# Patient Record
Sex: Female | Born: 1975 | Race: White | Hispanic: No | Smoking: Current some day smoker
Health system: Southern US, Community
[De-identification: ages and names within clinical notes are randomized; demographics above are authoritative.]

## PROBLEM LIST (undated history)

## (undated) ENCOUNTER — Emergency Department (HOSPITAL_COMMUNITY): Payer: MEDICAID

## (undated) DIAGNOSIS — F1010 Alcohol abuse, uncomplicated: Secondary | ICD-10-CM

## (undated) DIAGNOSIS — R011 Cardiac murmur, unspecified: Secondary | ICD-10-CM

## (undated) DIAGNOSIS — H409 Unspecified glaucoma: Secondary | ICD-10-CM

## (undated) DIAGNOSIS — F32A Depression, unspecified: Secondary | ICD-10-CM

## (undated) DIAGNOSIS — T7840XA Allergy, unspecified, initial encounter: Secondary | ICD-10-CM

## (undated) DIAGNOSIS — R569 Unspecified convulsions: Secondary | ICD-10-CM

## (undated) DIAGNOSIS — D649 Anemia, unspecified: Secondary | ICD-10-CM

## (undated) DIAGNOSIS — F419 Anxiety disorder, unspecified: Secondary | ICD-10-CM

## (undated) DIAGNOSIS — M1990 Unspecified osteoarthritis, unspecified site: Secondary | ICD-10-CM

## (undated) DIAGNOSIS — F319 Bipolar disorder, unspecified: Secondary | ICD-10-CM

## (undated) HISTORY — DX: Anxiety disorder, unspecified: F41.9

## (undated) HISTORY — DX: Anemia, unspecified: D64.9

## (undated) HISTORY — PX: BREAST BIOPSY: SHX20

## (undated) HISTORY — DX: Depression, unspecified: F32.A

## (undated) HISTORY — DX: Cardiac murmur, unspecified: R01.1

## (undated) HISTORY — DX: Unspecified osteoarthritis, unspecified site: M19.90

## (undated) HISTORY — DX: Unspecified glaucoma: H40.9

## (undated) HISTORY — PX: BREAST SURGERY: SHX581

## (undated) HISTORY — DX: Unspecified convulsions: R56.9

## (undated) HISTORY — PX: BREAST EXCISIONAL BIOPSY: SUR124

## (undated) HISTORY — DX: Allergy, unspecified, initial encounter: T78.40XA

---

## 1998-11-08 HISTORY — PX: LEEP: SHX91

## 2003-11-09 DIAGNOSIS — D649 Anemia, unspecified: Secondary | ICD-10-CM

## 2003-11-09 HISTORY — DX: Anemia, unspecified: D64.9

## 2004-08-10 ENCOUNTER — Emergency Department: Payer: Self-pay | Admitting: Emergency Medicine

## 2004-10-27 ENCOUNTER — Emergency Department: Payer: Self-pay | Admitting: Emergency Medicine

## 2004-11-27 ENCOUNTER — Emergency Department: Payer: Self-pay | Admitting: Unknown Physician Specialty

## 2005-03-28 ENCOUNTER — Observation Stay: Payer: Self-pay | Admitting: Obstetrics and Gynecology

## 2005-05-04 ENCOUNTER — Observation Stay: Payer: Self-pay | Admitting: Obstetrics and Gynecology

## 2005-08-29 ENCOUNTER — Emergency Department: Payer: Self-pay | Admitting: Emergency Medicine

## 2005-09-01 ENCOUNTER — Emergency Department: Payer: Self-pay | Admitting: Unknown Physician Specialty

## 2005-09-20 ENCOUNTER — Emergency Department: Payer: Self-pay | Admitting: Emergency Medicine

## 2005-09-21 ENCOUNTER — Ambulatory Visit: Payer: Self-pay | Admitting: Emergency Medicine

## 2006-02-07 ENCOUNTER — Emergency Department: Payer: Self-pay | Admitting: General Practice

## 2006-04-06 ENCOUNTER — Ambulatory Visit: Payer: Self-pay | Admitting: Unknown Physician Specialty

## 2006-04-10 ENCOUNTER — Emergency Department: Payer: Self-pay | Admitting: Emergency Medicine

## 2006-04-17 IMAGING — US ABDOMEN ULTRASOUND
1 series · 17 of 25 positions shown · non-contrast
Comparison: none

REASON FOR EXAM: RIGHT upper quadrant pain
COMMENTS:

[Series 1: abdomen ultrasound · 17 of 48 slices shown]
[im 1/48]
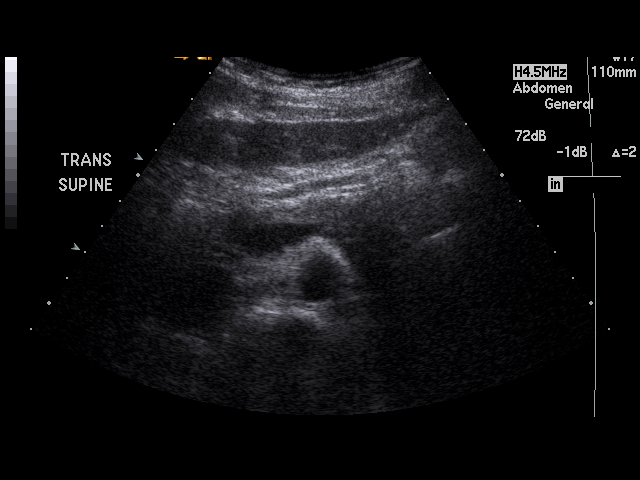
[im 4/48]
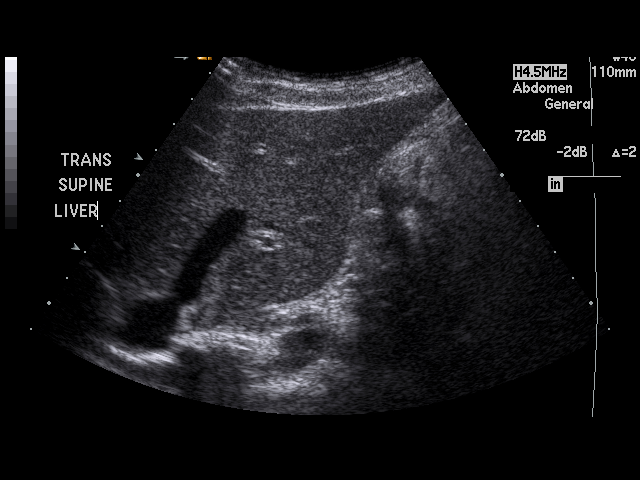
[im 6/48]
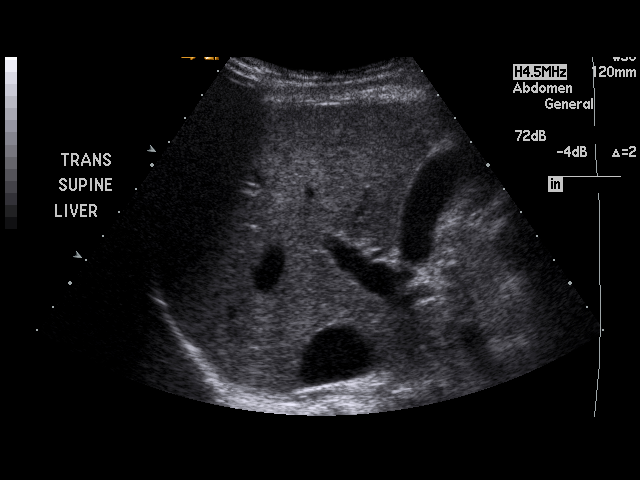
[im 10/48]
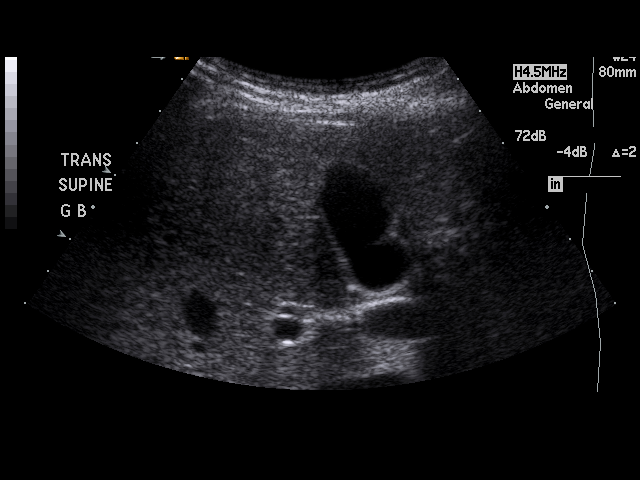
[im 12/48]
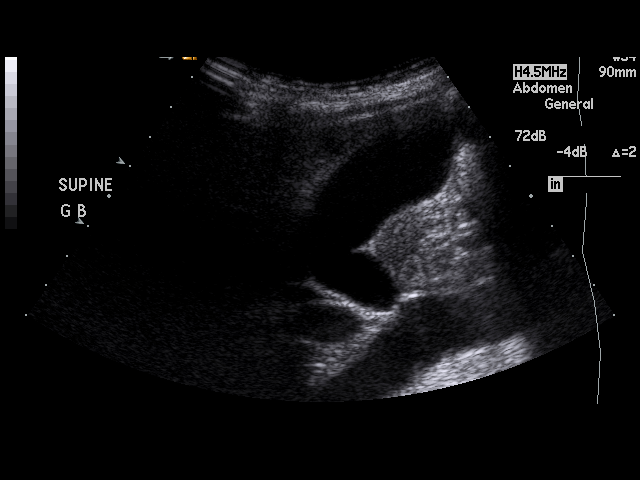
[im 16/48]
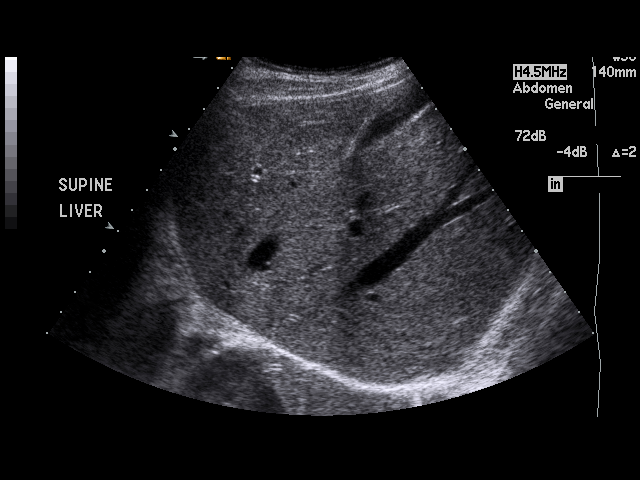
[im 18/48]
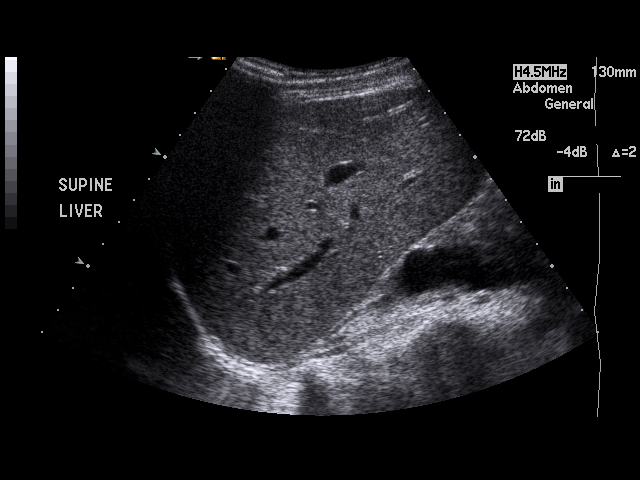
[im 22/48]
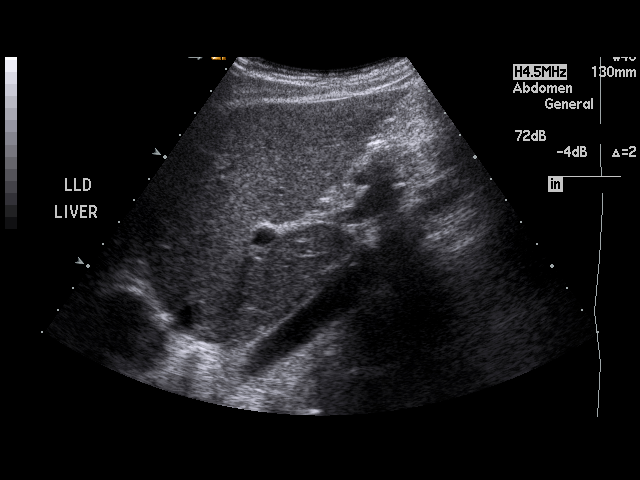
[im 24/48]
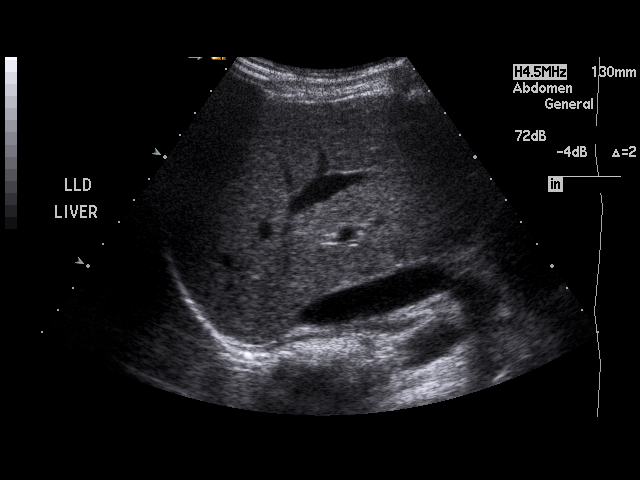
[im 26/48]
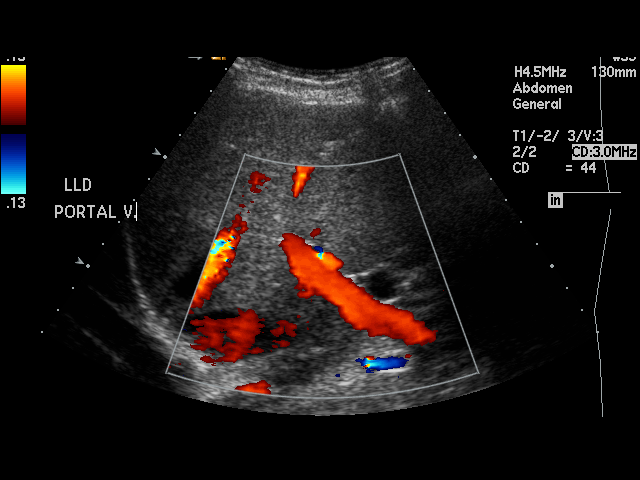
[im 30/48]
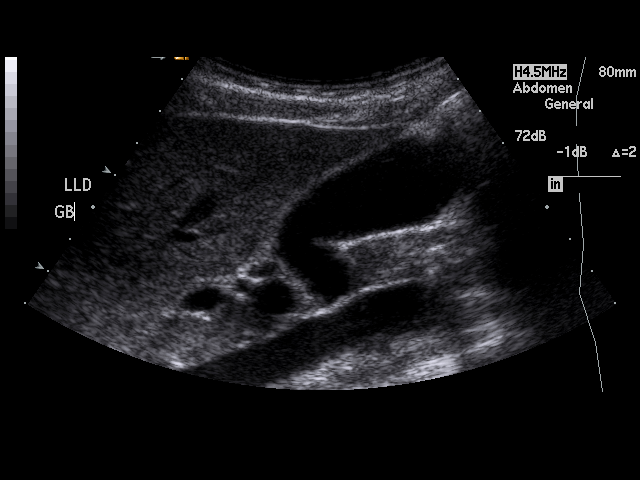
[im 32/48]
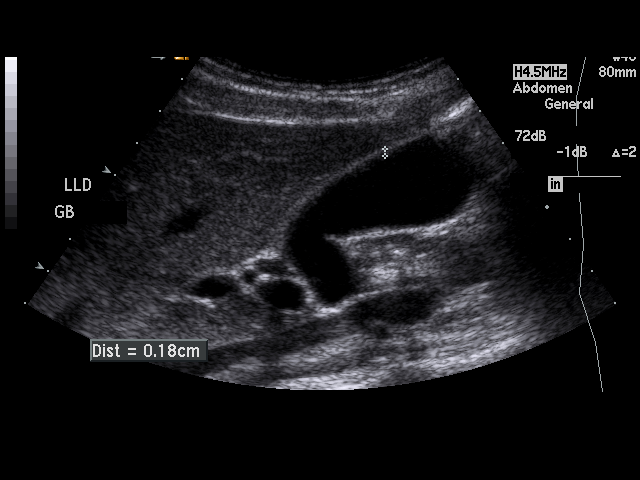
[im 36/48]
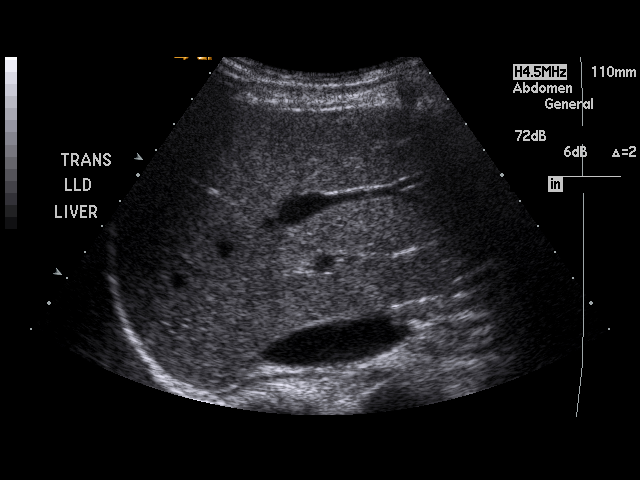
[im 38/48]
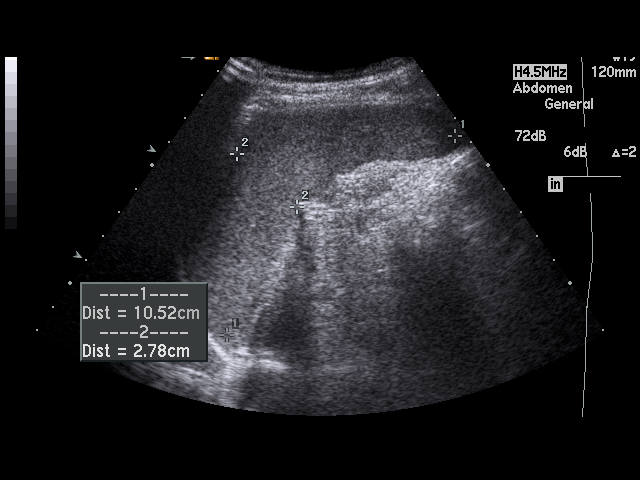
[im 42/48]
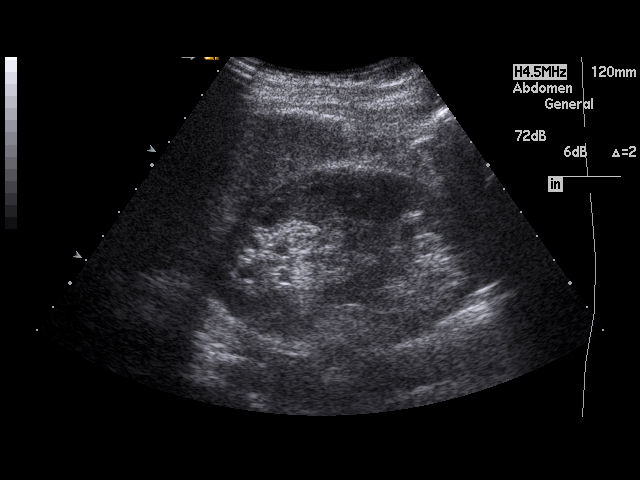
[im 44/48]
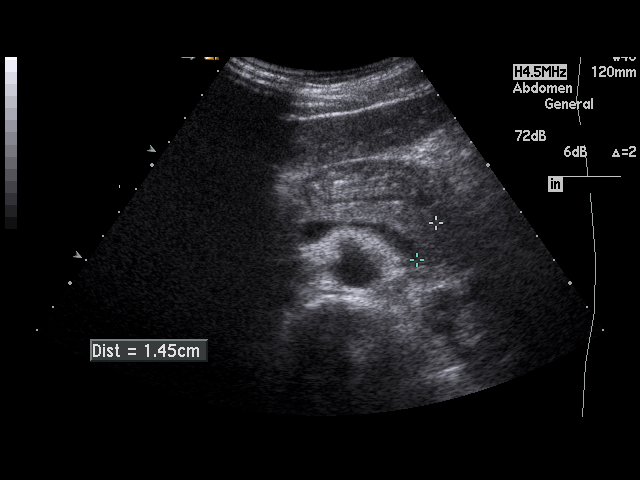
[im 48/48]
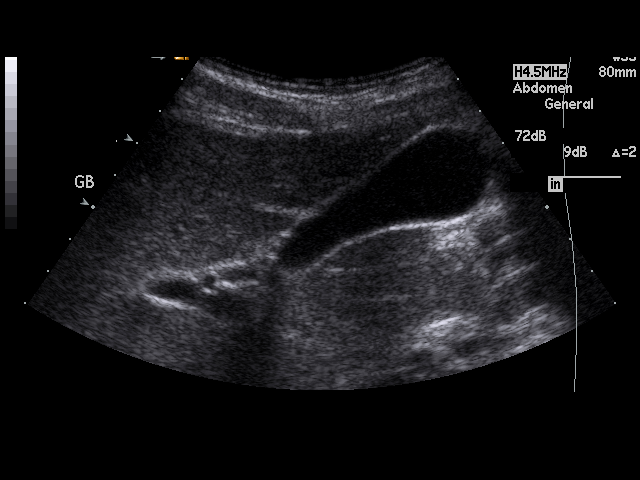

[17 of 25 positions shown; findings below may reference images not displayed]

PROCEDURE:     US  - US ABDOMEN GENERAL SURVEY  - September 21, 2005 [DATE]

RESULT:          The liver, spleen, and pancreas are normal in appearance.
The abdominal aorta is visualized and is normal in appearance.  No
gallstones are seen.  There is no thickening of the gallbladder wall.  The
common bile duct measures 3.3 mm in diameter, which is within normal limits.
 The kidneys show no hydronephrosis.  There is no ascites.
IMPRESSION: No significant abnormalities are noted.

## 2006-05-03 ENCOUNTER — Encounter: Payer: Self-pay | Admitting: Unknown Physician Specialty

## 2006-08-02 ENCOUNTER — Emergency Department: Payer: Self-pay

## 2006-08-02 ENCOUNTER — Other Ambulatory Visit: Payer: Self-pay

## 2006-11-08 DIAGNOSIS — R569 Unspecified convulsions: Secondary | ICD-10-CM

## 2006-11-08 HISTORY — DX: Unspecified convulsions: R56.9

## 2007-03-25 ENCOUNTER — Emergency Department: Payer: Self-pay | Admitting: Emergency Medicine

## 2007-04-12 ENCOUNTER — Inpatient Hospital Stay: Payer: Self-pay | Admitting: Internal Medicine

## 2007-04-16 ENCOUNTER — Emergency Department: Payer: Self-pay | Admitting: Emergency Medicine

## 2007-06-24 ENCOUNTER — Emergency Department: Payer: Self-pay | Admitting: Emergency Medicine

## 2008-02-14 ENCOUNTER — Emergency Department: Payer: Self-pay | Admitting: Emergency Medicine

## 2008-03-05 ENCOUNTER — Emergency Department: Payer: Self-pay | Admitting: Emergency Medicine

## 2008-03-11 ENCOUNTER — Emergency Department: Payer: Self-pay | Admitting: Internal Medicine

## 2008-03-16 ENCOUNTER — Emergency Department: Payer: Self-pay | Admitting: Emergency Medicine

## 2008-03-16 ENCOUNTER — Other Ambulatory Visit: Payer: Self-pay

## 2008-03-21 ENCOUNTER — Other Ambulatory Visit: Payer: Self-pay

## 2008-03-21 ENCOUNTER — Emergency Department: Payer: Self-pay | Admitting: Emergency Medicine

## 2009-02-09 ENCOUNTER — Emergency Department: Payer: Self-pay | Admitting: Emergency Medicine

## 2009-02-26 ENCOUNTER — Ambulatory Visit: Payer: Self-pay

## 2009-02-28 ENCOUNTER — Ambulatory Visit: Payer: Self-pay

## 2009-03-02 ENCOUNTER — Emergency Department: Payer: Self-pay | Admitting: Emergency Medicine

## 2009-03-14 ENCOUNTER — Ambulatory Visit: Payer: Self-pay | Admitting: Surgery

## 2009-03-20 ENCOUNTER — Ambulatory Visit: Payer: Self-pay | Admitting: Surgery

## 2009-03-27 ENCOUNTER — Emergency Department: Payer: Self-pay | Admitting: Internal Medicine

## 2009-04-01 ENCOUNTER — Emergency Department: Payer: Self-pay | Admitting: Emergency Medicine

## 2009-04-17 ENCOUNTER — Emergency Department: Payer: Self-pay | Admitting: Emergency Medicine

## 2009-04-27 ENCOUNTER — Emergency Department: Payer: Self-pay | Admitting: Emergency Medicine

## 2009-04-29 ENCOUNTER — Emergency Department: Payer: Self-pay | Admitting: Emergency Medicine

## 2009-05-02 ENCOUNTER — Emergency Department: Payer: Self-pay | Admitting: Emergency Medicine

## 2009-05-06 ENCOUNTER — Emergency Department: Payer: Self-pay | Admitting: Emergency Medicine

## 2009-05-07 ENCOUNTER — Emergency Department: Payer: Self-pay | Admitting: Emergency Medicine

## 2009-08-01 ENCOUNTER — Inpatient Hospital Stay: Payer: Self-pay | Admitting: Psychiatry

## 2009-09-07 ENCOUNTER — Emergency Department: Payer: Self-pay | Admitting: Unknown Physician Specialty

## 2009-11-22 IMAGING — CR DG ABDOMEN 1V
1 series · 1 of 1 positions shown · non-contrast
Comparison: none

REASON FOR EXAM: abd pain
COMMENTS:

[view not recorded]
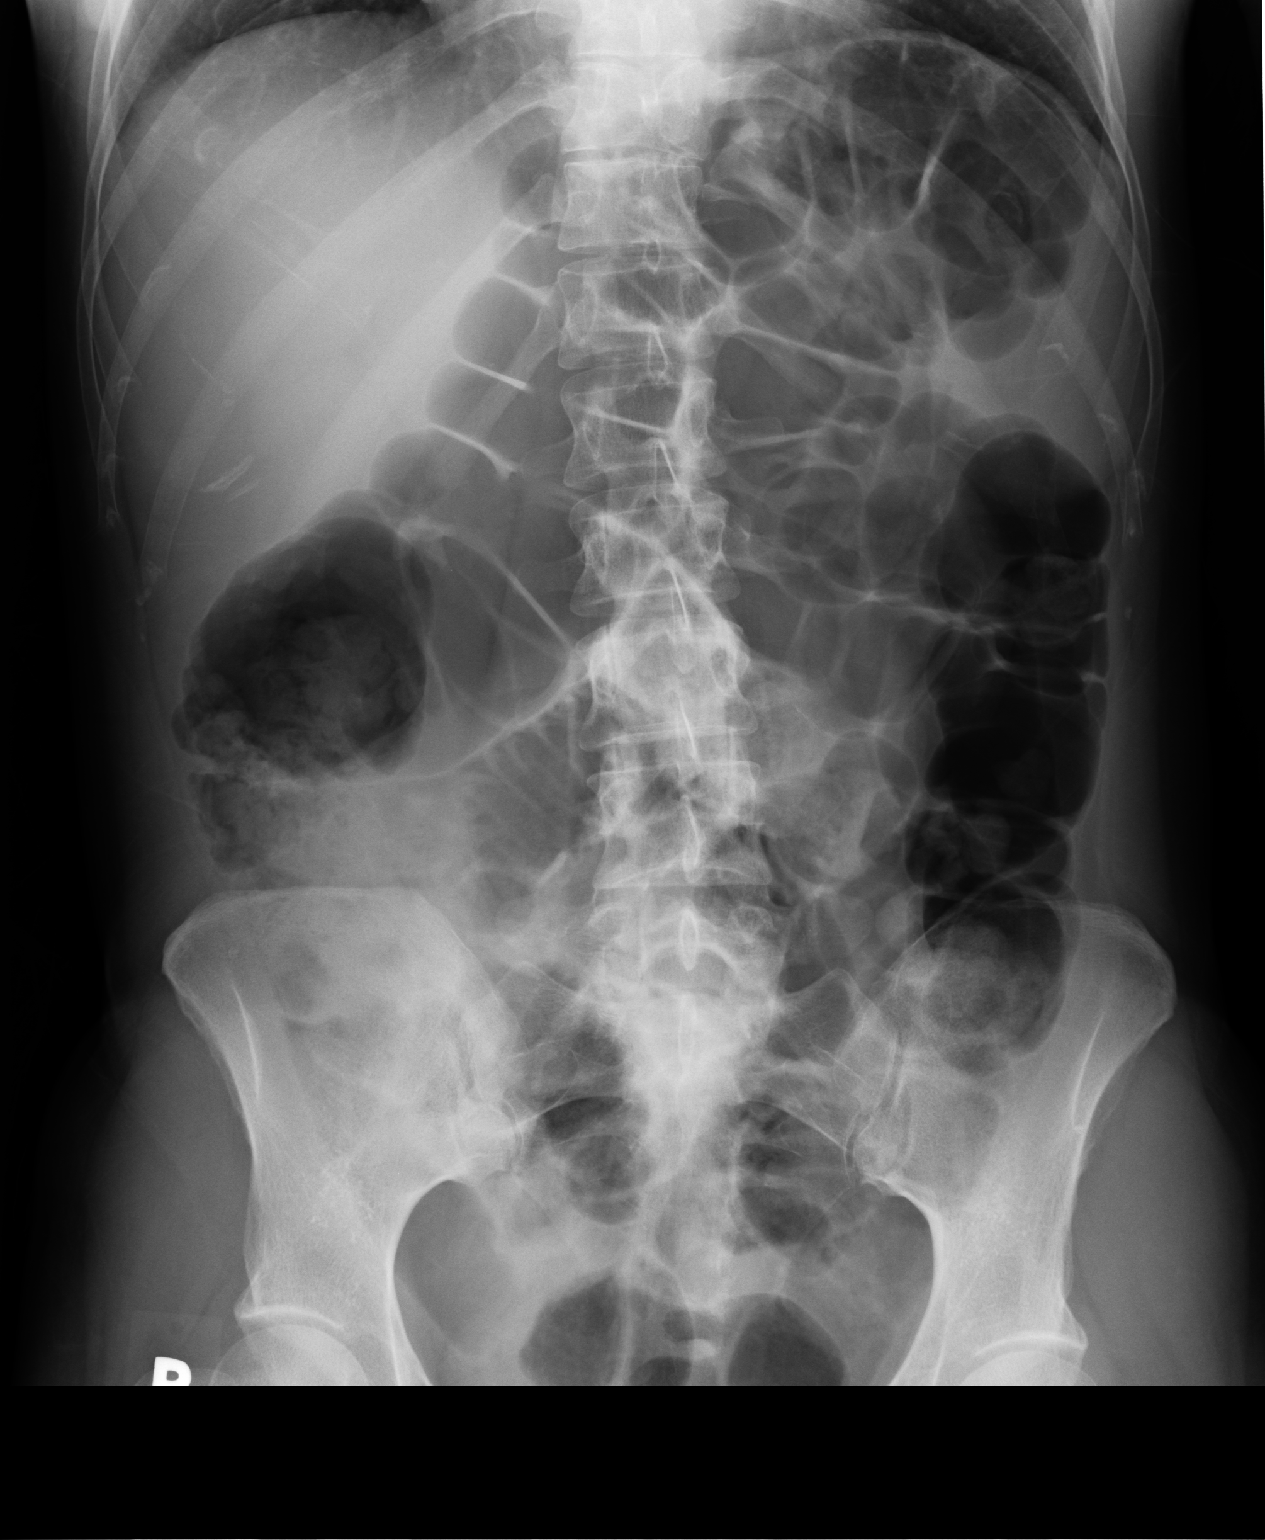

[1 of 1 positions shown; findings below may reference images not displayed]

PROCEDURE:     DXR - DXR KIDNEY URETER BLADDER  - April 28, 2009 [DATE]

RESULT:     Comparison is made to the study of 04/06/2006.

There is air and stool scattered through the colon to the rectum. There is
some air within nondistended loops of small bowel. A definite obstruction is
not appreciated. There may be ileus. No free air is appreciated on this
single projection.
IMPRESSION: Possible ileus. No definite obstruction. Clinical
correlation and follow-up is recommended.

## 2009-11-26 IMAGING — CR DG CHEST 1V PORT
1 series · 1 of 1 positions shown · non-contrast
Comparison: none

REASON FOR EXAM: cp
COMMENTS:

PROCEDURE:     DXR - DXR PORTABLE CHEST SINGLE VIEW  - May 02, 2009 [DATE]
RESULT:     No acute cardiopulmonary disease noted.

[view not recorded]
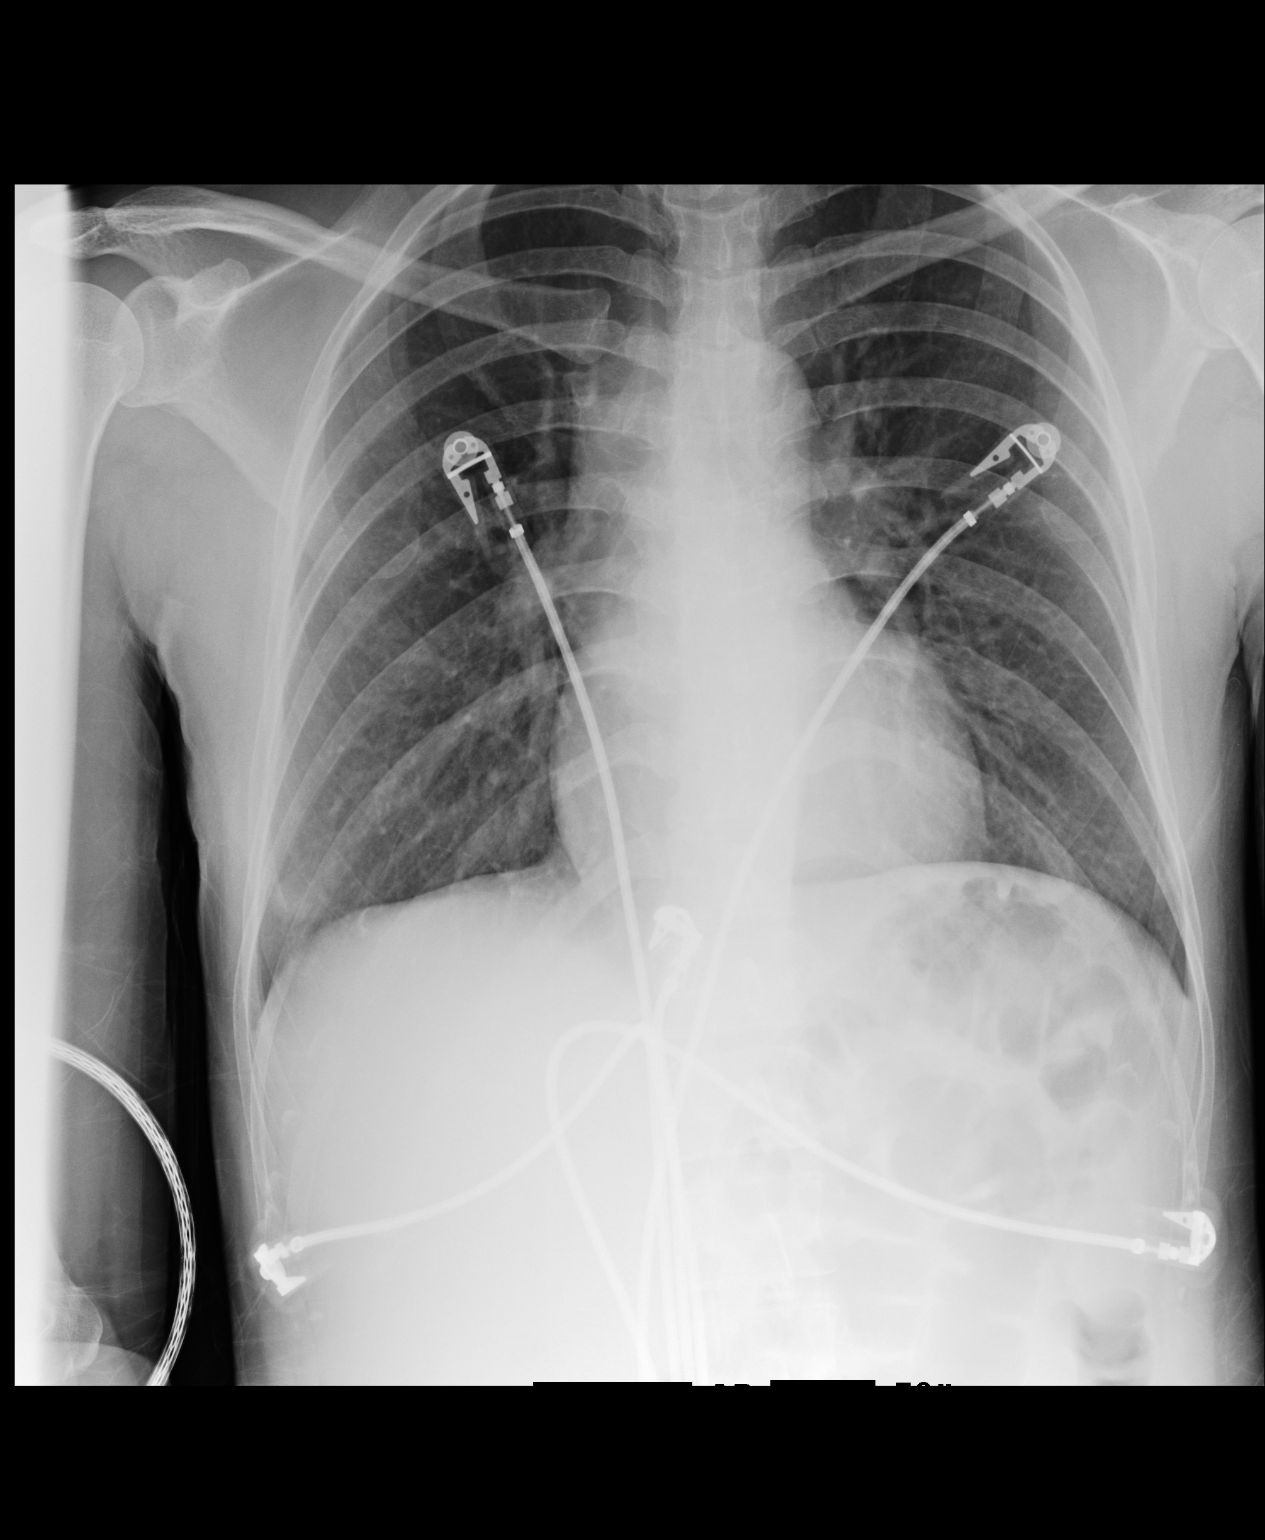

[1 of 1 positions shown; findings below may reference images not displayed]

IMPRESSION: No acute cardiopulmonary disease. Density is noted in the
RIGHT lower chest and is most likely a nipple shadow. To confirm that this
is not a pulmonary nodule, follow-up PA and lateral chest x-ray with and
without nipple marker is suggested.

## 2010-03-11 ENCOUNTER — Emergency Department: Payer: Self-pay | Admitting: Emergency Medicine

## 2010-07-17 ENCOUNTER — Inpatient Hospital Stay: Payer: Self-pay | Admitting: Psychiatry

## 2011-03-29 ENCOUNTER — Emergency Department: Payer: Self-pay | Admitting: Emergency Medicine

## 2011-08-22 ENCOUNTER — Observation Stay: Payer: Self-pay | Admitting: Obstetrics and Gynecology

## 2012-04-05 ENCOUNTER — Emergency Department: Payer: Self-pay | Admitting: Emergency Medicine

## 2012-05-26 ENCOUNTER — Emergency Department: Payer: Self-pay | Admitting: Emergency Medicine

## 2012-06-27 ENCOUNTER — Inpatient Hospital Stay: Payer: Self-pay | Admitting: Unknown Physician Specialty

## 2012-06-27 LAB — COMPREHENSIVE METABOLIC PANEL
Albumin: 3.5 g/dL (ref 3.4–5.0)
Alkaline Phosphatase: 67 U/L (ref 50–136)
BUN: 11 mg/dL (ref 7–18)
Calcium, Total: 8.5 mg/dL (ref 8.5–10.1)
Co2: 25 mmol/L (ref 21–32)
Creatinine: 0.78 mg/dL (ref 0.60–1.30)
EGFR (Non-African Amer.): >60
Potassium: 3.8 mmol/L (ref 3.5–5.1)
SGPT (ALT): 19 U/L (ref 12–78)

## 2012-06-27 LAB — URINALYSIS, COMPLETE
Bilirubin,UR: NEGATIVE
Blood: NEGATIVE
Glucose,UR: NEGATIVE mg/dL (ref 0–75)
Ketone: NEGATIVE
Nitrite: NEGATIVE
Ph: 7 (ref 4.5–8.0)
Protein: NEGATIVE
RBC,UR: 1 /HPF (ref 0–5)
Specific Gravity: 1.015 (ref 1.003–1.030)
Squamous Epithelial: 5

## 2012-06-27 LAB — DRUG SCREEN, URINE
Amphetamines, Ur Screen: NEGATIVE (ref ?–1000)
Benzodiazepine, Ur Scrn: NEGATIVE (ref ?–200)
Cannabinoid 50 Ng, Ur ~~LOC~~: NEGATIVE (ref ?–50)
Methadone, Ur Screen: NEGATIVE (ref ?–300)
Opiate, Ur Screen: NEGATIVE (ref ?–300)
Phencyclidine (PCP) Ur S: NEGATIVE (ref ?–25)

## 2012-06-27 LAB — ACETAMINOPHEN LEVEL: Acetaminophen: <2 ug/mL

## 2012-06-27 LAB — PREGNANCY, URINE: Pregnancy Test, Urine: NEGATIVE m[IU]/mL

## 2012-06-27 LAB — CBC
MCHC: 33.8 g/dL (ref 32.0–36.0)
Platelet: 232 10*3/uL (ref 150–440)
RDW: 12.6 % (ref 11.5–14.5)

## 2012-06-27 LAB — ETHANOL
Ethanol %: <0.003 % (ref 0.000–0.080)
Ethanol: <3 mg/dL

## 2012-06-27 LAB — SALICYLATE LEVEL: Salicylates, Serum: 2.4 mg/dL

## 2012-07-04 LAB — VALPROIC ACID LEVEL: Valproic Acid: 49 ug/mL — ABNORMAL LOW

## 2012-07-05 LAB — URINALYSIS, COMPLETE
Bilirubin,UR: NEGATIVE
Nitrite: NEGATIVE
Protein: NEGATIVE
RBC,UR: 1 /HPF (ref 0–5)
WBC UR: 1 /HPF (ref 0–5)

## 2013-07-12 ENCOUNTER — Emergency Department: Payer: Self-pay | Admitting: Emergency Medicine

## 2013-07-12 LAB — CBC
HCT: 40.1 % (ref 35.0–47.0)
HGB: 14.1 g/dL (ref 12.0–16.0)
MCH: 31.4 pg (ref 26.0–34.0)
MCHC: 35.1 g/dL (ref 32.0–36.0)
MCV: 90 fL (ref 80–100)
Platelet: 207 10*3/uL (ref 150–440)
RBC: 4.48 10*6/uL (ref 3.80–5.20)
RDW: 12.5 % (ref 11.5–14.5)
WBC: 10.5 10*3/uL (ref 3.6–11.0)

## 2013-07-12 LAB — DRUG SCREEN, URINE
Barbiturates, Ur Screen: NEGATIVE (ref ?–200)
Benzodiazepine, Ur Scrn: NEGATIVE (ref ?–200)
Cannabinoid 50 Ng, Ur ~~LOC~~: NEGATIVE (ref ?–50)
Cocaine Metabolite,Ur ~~LOC~~: NEGATIVE (ref ?–300)
MDMA (Ecstasy)Ur Screen: NEGATIVE (ref ?–500)
Methadone, Ur Screen: NEGATIVE (ref ?–300)
Opiate, Ur Screen: NEGATIVE (ref ?–300)
Phencyclidine (PCP) Ur S: NEGATIVE (ref ?–25)

## 2013-07-12 LAB — COMPREHENSIVE METABOLIC PANEL
Alkaline Phosphatase: 71 U/L (ref 50–136)
Bilirubin,Total: 0.1 mg/dL — ABNORMAL LOW (ref 0.2–1.0)
Calcium, Total: 8.6 mg/dL (ref 8.5–10.1)
Chloride: 111 mmol/L — ABNORMAL HIGH (ref 98–107)
Creatinine: 0.54 mg/dL — ABNORMAL LOW (ref 0.60–1.30)
EGFR (African American): >60
EGFR (Non-African Amer.): >60
Glucose: 58 mg/dL — ABNORMAL LOW (ref 65–99)
SGPT (ALT): 32 U/L (ref 12–78)
Sodium: 139 mmol/L (ref 136–145)

## 2013-07-12 LAB — ETHANOL
Ethanol %: <0.003 % (ref 0.000–0.080)
Ethanol: <3 mg/dL

## 2013-07-12 LAB — TSH: Thyroid Stimulating Horm: 0.56 u[IU]/mL

## 2013-07-12 LAB — SALICYLATE LEVEL: Salicylates, Serum: 4.5 mg/dL — ABNORMAL HIGH

## 2013-07-12 LAB — ACETAMINOPHEN LEVEL: Acetaminophen: <2 ug/mL

## 2013-07-14 LAB — COMPREHENSIVE METABOLIC PANEL
Albumin: 4.3 g/dL (ref 3.4–5.0)
BUN: 13 mg/dL (ref 7–18)
Bilirubin,Total: 0.1 mg/dL — ABNORMAL LOW (ref 0.2–1.0)
Calcium, Total: 8.9 mg/dL (ref 8.5–10.1)
Chloride: 106 mmol/L (ref 98–107)
Creatinine: 0.86 mg/dL (ref 0.60–1.30)
EGFR (Non-African Amer.): >60
Glucose: 100 mg/dL — ABNORMAL HIGH (ref 65–99)
Potassium: 3.9 mmol/L (ref 3.5–5.1)
SGOT(AST): 12 U/L — ABNORMAL LOW (ref 15–37)
Total Protein: 7.3 g/dL (ref 6.4–8.2)

## 2013-07-14 LAB — URINALYSIS, COMPLETE
Bilirubin,UR: NEGATIVE
Blood: NEGATIVE
Glucose,UR: NEGATIVE mg/dL (ref 0–75)
Ketone: NEGATIVE
Leukocyte Esterase: NEGATIVE
RBC,UR: 1 /HPF (ref 0–5)
WBC UR: <1 /HPF (ref 0–5)

## 2013-07-14 LAB — TSH: Thyroid Stimulating Horm: 1.03 u[IU]/mL

## 2013-07-14 LAB — CBC
HGB: 14.7 g/dL (ref 12.0–16.0)
MCH: 31.9 pg (ref 26.0–34.0)
MCHC: 35.2 g/dL (ref 32.0–36.0)
Platelet: 208 10*3/uL (ref 150–440)
RDW: 12.9 % (ref 11.5–14.5)

## 2013-07-14 LAB — ETHANOL: Ethanol: <3 mg/dL

## 2013-07-14 LAB — DRUG SCREEN, URINE
Barbiturates, Ur Screen: NEGATIVE (ref ?–200)
Cannabinoid 50 Ng, Ur ~~LOC~~: NEGATIVE (ref ?–50)
MDMA (Ecstasy)Ur Screen: NEGATIVE (ref ?–500)
Methadone, Ur Screen: NEGATIVE (ref ?–300)
Opiate, Ur Screen: NEGATIVE (ref ?–300)
Tricyclic, Ur Screen: NEGATIVE (ref ?–1000)

## 2013-07-15 ENCOUNTER — Inpatient Hospital Stay: Payer: Self-pay | Admitting: Psychiatry

## 2013-07-18 LAB — VALPROIC ACID LEVEL: Valproic Acid: 63 ug/mL

## 2013-07-18 LAB — AMMONIA: Ammonia, Plasma: 75 mcmol/L — ABNORMAL HIGH (ref 11–32)

## 2013-07-19 LAB — VALPROIC ACID LEVEL: Valproic Acid: 20 ug/mL — ABNORMAL LOW

## 2013-07-21 LAB — AMMONIA: Ammonia, Plasma: 39 mcmol/L — ABNORMAL HIGH (ref 11–32)

## 2013-10-02 DIAGNOSIS — Z8669 Personal history of other diseases of the nervous system and sense organs: Secondary | ICD-10-CM | POA: Insufficient documentation

## 2013-10-02 LAB — HEMOGLOBIN A1C: HEMOGLOBIN A1C: 5.4

## 2013-10-02 LAB — LIPID PANEL
Cholesterol: 181 mg/dL (ref 0–200)
HDL: 72 mg/dL — AB (ref 35–70)
LDL Cholesterol: 97 mg/dL
Triglycerides: 62 mg/dL (ref 40–160)

## 2013-10-17 ENCOUNTER — Ambulatory Visit: Payer: Self-pay

## 2014-01-10 LAB — COMPREHENSIVE METABOLIC PANEL
ALBUMIN: 4.6 g/dL (ref 3.4–5.0)
ANION GAP: 4 — AB (ref 7–16)
Alkaline Phosphatase: 69 U/L
BILIRUBIN TOTAL: 0.3 mg/dL (ref 0.2–1.0)
BUN: 7 mg/dL (ref 7–18)
CALCIUM: 9.5 mg/dL (ref 8.5–10.1)
CHLORIDE: 104 mmol/L (ref 98–107)
CO2: 29 mmol/L (ref 21–32)
Creatinine: 0.72 mg/dL (ref 0.60–1.30)
EGFR (African American): >60
Glucose: 96 mg/dL (ref 65–99)
Osmolality: 272 (ref 275–301)
POTASSIUM: 3.9 mmol/L (ref 3.5–5.1)
SGOT(AST): <5 U/L — ABNORMAL LOW (ref 15–37)
SGPT (ALT): 15 U/L (ref 12–78)
Sodium: 137 mmol/L (ref 136–145)
TOTAL PROTEIN: 8.1 g/dL (ref 6.4–8.2)

## 2014-01-10 LAB — ETHANOL: Ethanol %: <0.003 % (ref 0.000–0.080)

## 2014-01-10 LAB — CBC
HCT: 45 % (ref 35.0–47.0)
HGB: 14.9 g/dL (ref 12.0–16.0)
MCH: 30.9 pg (ref 26.0–34.0)
MCHC: 33.2 g/dL (ref 32.0–36.0)
MCV: 93 fL (ref 80–100)
PLATELETS: 204 10*3/uL (ref 150–440)
RBC: 4.84 10*6/uL (ref 3.80–5.20)
RDW: 12.6 % (ref 11.5–14.5)
WBC: 9.4 10*3/uL (ref 3.6–11.0)

## 2014-01-10 LAB — URINALYSIS, COMPLETE
BLOOD: NEGATIVE
Bilirubin,UR: NEGATIVE
Glucose,UR: NEGATIVE mg/dL (ref 0–75)
Ketone: NEGATIVE
LEUKOCYTE ESTERASE: NEGATIVE
Nitrite: NEGATIVE
Ph: 6 (ref 4.5–8.0)
Protein: NEGATIVE
Specific Gravity: 1.011 (ref 1.003–1.030)
WBC UR: 3 /HPF (ref 0–5)

## 2014-01-10 LAB — DRUG SCREEN, URINE

## 2014-01-10 LAB — ACETAMINOPHEN LEVEL: Acetaminophen: <2 ug/mL

## 2014-01-10 LAB — LITHIUM LEVEL: Lithium: 0.62 mmol/L

## 2014-01-10 LAB — CARBAMAZEPINE LEVEL, TOTAL: CARBAMAZEPINE: 0.6 ug/mL — AB (ref 4.0–12.0)

## 2014-01-10 LAB — SALICYLATE LEVEL: Salicylates, Serum: 4.3 mg/dL — ABNORMAL HIGH

## 2014-01-12 ENCOUNTER — Inpatient Hospital Stay: Payer: Self-pay | Admitting: Psychiatry

## 2014-01-20 LAB — COMPREHENSIVE METABOLIC PANEL
Albumin: 3.5 g/dL (ref 3.4–5.0)
Alkaline Phosphatase: 58 U/L
Anion Gap: 2 — ABNORMAL LOW (ref 7–16)
BUN: 14 mg/dL (ref 7–18)
Bilirubin,Total: 0.3 mg/dL (ref 0.2–1.0)
Calcium, Total: 8.8 mg/dL (ref 8.5–10.1)
Chloride: 106 mmol/L (ref 98–107)
Co2: 31 mmol/L (ref 21–32)
Creatinine: 0.78 mg/dL (ref 0.60–1.30)
EGFR (African American): >60
EGFR (Non-African Amer.): >60
Glucose: 83 mg/dL (ref 65–99)
Osmolality: 277 (ref 275–301)
Potassium: 4.2 mmol/L (ref 3.5–5.1)
SGOT(AST): <5 U/L — ABNORMAL LOW (ref 15–37)
SGPT (ALT): 25 U/L (ref 12–78)
Sodium: 139 mmol/L (ref 136–145)
Total Protein: 6.3 g/dL — ABNORMAL LOW (ref 6.4–8.2)

## 2014-01-20 LAB — CARBAMAZEPINE LEVEL, TOTAL: CARBAMAZEPINE: 7.7 ug/mL (ref 4.0–12.0)

## 2014-02-14 LAB — CBC AND DIFFERENTIAL
HCT: 39 % (ref 36–46)
Hemoglobin: 12.7 g/dL (ref 12.0–16.0)
Neutrophils Absolute: 5 /uL
Platelets: 171 10*3/uL (ref 150–399)
WBC: 6.5 10^3/mL

## 2014-02-14 LAB — HEPATIC FUNCTION PANEL
ALT: 22 U/L (ref 7–35)
AST: 9 U/L — AB (ref 13–35)
Alkaline Phosphatase: 69 U/L (ref 25–125)
BILIRUBIN DIRECT: 0.09 mg/dL (ref 0.01–0.4)
Bilirubin, Total: 0.2 mg/dL

## 2014-02-14 LAB — BASIC METABOLIC PANEL
BUN: 9 mg/dL (ref 4–21)
CREATININE: 0.7 mg/dL (ref 0.5–1.1)
GLUCOSE: 113 mg/dL
POTASSIUM: 4.5 mmol/L (ref 3.4–5.3)
SODIUM: 143 mmol/L (ref 137–147)

## 2014-03-06 DIAGNOSIS — N871 Moderate cervical dysplasia: Secondary | ICD-10-CM | POA: Insufficient documentation

## 2014-03-06 DIAGNOSIS — Z8659 Personal history of other mental and behavioral disorders: Secondary | ICD-10-CM | POA: Insufficient documentation

## 2014-04-02 ENCOUNTER — Ambulatory Visit: Payer: Self-pay

## 2014-04-02 LAB — HCG, QUANTITATIVE, PREGNANCY: Beta Hcg, Quant.: <1 m[IU]/mL — ABNORMAL LOW
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
## 2015-02-25 NOTE — Discharge Summary (Signed)
PATIENT NAME:  Autumn [name], CARRAWAY MR#:  [id number] DATE OF BIRTH:  04-Aug-1976  DATE OF ADMISSION:  06/27/2012 DATE OF DISCHARGE:  07/06/2012  HISTORY OF PRESENT ILLNESS: The patient was admitted with bipolar, depressed, with some psychosis. For further details, please see typed attached History and Physical.   ACCESSORY CLINICAL DATA: Acetaminophen less than 2.0. Glucose 104, BUN, creatinine, and electrolytes within normal limits. Liver function tests within normal limits. Calcium within normal limits. Ethanol blood level is negative. CBC is within normal limits. Salicylates are 2.4. TSH 0.594, within normal limits. Drug screen is negative. Urinalysis showed 1+ leukocyte esterase, 1+ bacteria. Pregnancy test is negative. Depakote blood level was 49 on 500 mg of Depakote per day.   HOSPITAL COURSE: The patient was placed on Seroquel 300 and also on Depakote at 500 from which the blood level was 49, increased to 750 mg by the time of discharge. Wellbutrin was added a little bit for depression because it had helped before and had also helped her stop smoking. Over the course of the hospitalization, her depressive symptoms responded, interest and energy picked up, almost no psychosis by the time of discharge, and she was essentially euthymic. She was somewhat somatic, and also she was treated with antibiotics for a urinary tract infection. The patient also developed a vaginal infection and was given miconazole. The patient interacted well in groups and felt that she had gained something from those groups. At the time of discharge, she will go back to live with her boyfriend and will also see her mother and father. She was interested in finding a job. Also, we tried to make some arrangements so that she could obtain her medications.   DIAGNOSES:  AXIS I:  1. Bipolar I, depressed, with psychosis.  2. Panic disorder with some agoraphobia.  3. History of polysubstance abuse.   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     AXIS III:  1. Glaucoma.  2. Possible history of some back injury. 3. Urinary tract infection.   CONDITION ON DISCHARGE: Good.   DISPOSITION: The patient will be seen at [facility], [facility]. Hopefully we have made some arrangements for her to get Medicaid so she can see an ophthalmologist in regards to her glaucoma.   MEDICATIONS:  1. She was given one pill of miconazole.  2. Depakote 250 in the morning, 500 at bedtime. 3. Wellbutrin 150 daily.  4. Seroquel 300 mg at bedtime.   DIET AND ACTIVITY: As tolerated.   ____________________________ Tanna Furry, MD wjr:cbb D: 07/05/2012 16:17:37 ET T: 07/06/2012 12:28:18 ET JOB#: [id number]  cc: Tanna Furry, MD, <Dictator> Lew Dawes MD ELECTRONICALLY SIGNED 07/07/2012 14:51

## 2015-02-28 NOTE — H&P (Signed)
PATIENT NAME:  Autumn [name], Autumn [name] MR#:  [id number] DATE OF BIRTH:  1976-04-29  DATE OF ADMISSION:  07/15/2013  REASON FOR ADMISSION:  Psychiatric evaluation.  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  The patient is separated for 6 years after being married for the second time for 3 years.  The patient lives with a roommate who is 80 years old.  The patient comes to inpatient psychiatry at [facility] with a chief complaint that her roommate got worried about her and thought that she needed help and so she is here for help.    HISTORY OF PRESENT ILLNESS:  The patient reports that her boyfriend or fiance's close friend died and she was very close to that other man as she had sex with him and she was worried and got depressed and her roommate thought she needed help and so she is here for help.    PAST PSYCHIATRIC HISTORY:  This is her fifth inpatient on psychiatry, first inpatient for psychiatry was in 2010 where she was inpatient at [facility] and was transferred to [facility] where she spent 6 weeks.  The longest period of inpatient psychiatry was for 6 weeks at [facility] where she was depressed.  She tried to hurt herself when she walked in front of traffic.  According to the information obtained from the chart, she was inpatient in August of 2013 at [facility] with a chief complaint that she has not been doing well.  According to information obtained, she had several inpatient on psychiatry and she has diagnosis of bipolar disorder and she had been depressed and had all the symptoms of depression.  She also has history of mania.  Currently, she is being followed  by Dr. Sammuel Cooper at [facility] and last appointment was on 07/13/2013, next appointment is in a few weeks coming up.    FAMILY HISTORY OF MENTAL ILLNESS:  Family does not admit that they have mental illness.    FAMILY  HISTORY:  Raised by parents.  Father is a Librarian, academic of a warehouse, but he is retired.  He is living and he is 25 years old.  Mother is employed.  She is a Education officer, museum and she is living and she is 72 years old.  Has 1 sister who is 5 years younger.  Close to family.  PERSONAL HISTORY:  Born in [place], [place].  Graduated from high school, has 4-year degree at [employer] with major in [place] but minor in psychology.    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  Longest job that she has had was as Automotive engineer so far.  Currently employed at [employer]:  None.  MARRIAGES:  Married twice.  First marriage ended after 3 months because she could not get along with ex-husband, has no children from him.  Second marriage ended in separation and currently separated for many years.  Has 2 children, 76 is 38-years old from marriage and grandmother takes care of that child.  There is a 67-year-old from a relationship and the patient gave her away for adoption.    HABITS:  Alcohol, first drink of 17 years.  Did have problems with alcohol drinking.  No history of DWIs. No history of public drunkenness.  Tried every drug possible including THC, crack cocaine  and smoked them and used IV cocaine.  Admits that she has been taking pain pills whenever she can, occasionally from street.  Last used 30 mg of Roxicet on 07/13/2013.  Does admit of smoking nicotine cigarettes, at a rate of 1/2 pack a day for 6 years.    MEDICAL HISTORY:  No known high blood pressure. No known diabetes mellitus. Status post 2 C-sections, status post 2 right breast biopsies on the right breast which were benign.  Had borderline glaucoma according to the chart and not able to afford to go to see an eye doctor.  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  Currently, she is not being followed by any physician and goes to  the Emergency Room as needed.  PHYSICAL EXAMINATION VITAL SIGNS:  Temperature 97.4, pulse 73 per minute and regular, respirations 18 per minute and regular, blood pressure 110/70 mmHg. HEENT:  Normocephalic, atraumatic.  Eyes PERRLA.  Fundi are benign   NECK:  Supple without any organomegaly  or thyromegaly. CHEST:  Normal expansion  HEART:  Normal S1 and S2 without any murmurs or gallops.   ABDOMEN:  Soft, no organomegaly, bowel sounds heard.  Scar from surgery healed well.   RECTAL AND PELVIC:  Deferred. NEUROLOGIC:  Gait is normal.  Normal vision.  Cranial nerves II through XII grossly intact.  DTRs are 2+ and normal,    MENTAL STATUS EXAMINATION:  The patient is alert and oriented to place, person and time, situation and reason for admission to [facility].  Affect is flat, mood depressed.  Admits feeling hopeless and helpless.  Admits feeling low and down.  Denies feeling worthless and useless.  Denies any suicidal or homicidal plans and said, "Not now."  Admits that when she is upset she feels frustrated and she wants to hurt people, but currently it is under control.  Admits that she tries her best not to feel paranoid about people around. Hearing voices and does admit hearing ex-husband's voice.  He says, "Boo," which makes no sense.  Denies any visual hallucinations.  Memory is intact.  She could spell the word "world" forward and backward.  She could count money.  General knowledge and information fair.  She knew the capital of [place], capital of the [place], name of the current president and previous presidents.  Contracts for safety.  Insight and judgment guarded.    IMPRESSION AXIS I:  History of bipolar disorder, current episode depressed with psychosis.  History of panic and anxiety disorder according to the chart, polysubstance abuse/dependence and last used Roxi from the street on 07/13/2013, personality disorder with antisocial traits. 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   AXIS IV:  Long history of mental illness and problems related to the same. AXIS V:  GAF 20.  PLAN:  The patient is admitted to [facility] for close observation and management.  She will be started back on her medications which were being prescribed by Dr. Sammuel Cooper.  During her stay in the hospital, she will be given milieu therapy and supportive counseling.  She will take part in individual and group therapy where coping skills and dealing will mental illness and people around will be addressed.  At the time of discharge, the patient will be stabilized and appropriate  followup appointment will be made with Dr. Sammuel Cooper at [facility]    ____________________________ Wallace Cullens. Franchot Mimes, MD skc:cs D: 07/15/2013 18:19:00 ET T: 07/15/2013 18:42:47 ET JOB#: [id number]  cc: Arlyn Leak K. Franchot Mimes, MD, <Dictator> Dewain Penning MD ELECTRONICALLY SIGNED 07/21/2013 15:58
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
## 2015-03-01 NOTE — Discharge Summary (Signed)
PATIENT NAME:  Autumn, [name] MR#:  [id number] DATE OF BIRTH:  June 07, 1976  DATE OF ADMISSION:  01/12/2014 DATE OF DISCHARGE:  01/21/2014  HOSPITAL COURSE: See dictated history and physical for details of admission. A 39 year old woman with a history of bipolar disorder. Came into the hospital depressed with suicidal ideation. She has been treated in the hospital with Tegretol and lithium, and continued on Invega injectable as her primary antipsychotic. She has been treated with medication management as well as group therapy and individual daily therapy. Gradually mood has improved. The patient states now that she is feeling much better, feels more optimistic and upbeat, feels more socially active. Denies any suicidal ideation. Not having any hallucinations. Tolerating medicine well. She feels comfortable and safe going back to live with her boyfriend again and will follow up with the [facility] team. She is stable on her medication with reasonable blood levels.   LABORATORY RESULTS: On the 15th a Tegretol level was 7.7. Chemistry panel all normal. Admission labs included a lithium level of 0.62, Tegretol at that time 0.6. Alcohol undetectable. Chemistry panel unremarkable. Urinalysis normal. CBC normal.   MENTAL STATUS EXAM AT DISCHARGE: Neatly groomed and dressed woman who looks her stated age, cooperative with the interview. Eye contact good. Psychomotor activity a little bit slow. Speech quiet, decreased in total amount but normal for her. Affect blunted. Mood stated as good. Thoughts are lucid. No evidence of loosening of associations or delusions. Denies auditory or visual hallucinations. Denies suicidal or homicidal ideation. Judgment and insight adequate. Intelligence normal, baseline; fund of knowledge normal. Short-term memory intact, 3 out of 3 objects immediately and at 3 minutes. Long-term memory grossly intact.   DISCHARGE MEDICATIONS: Carbamazepine 200 mg twice a day, benztropine 1 mg at  night, lithium carbonate 900 mg at night, multivitamin once a day, bupropion XL 150 mg once a day, Invega Sustenna 156 mg intramuscular next due on February 06, 2014, docusate 100 mg twice a day, MiraLax 17 grams once a day.   DISPOSITION: Discharge home and follow up with [facility].   DIAGNOSIS, PRINCIPAL AND PRIMARY:  AXIS I: Bipolar disorder type 1, recently depressed.   SECONDARY DIAGNOSES: AXIS I: No further diagnosis.  AXIS II: Deferred.  AXIS III: Chronic constipation, low weight, decreased p.o. intake.  AXIS IV: Severe from chronic illness and impaired social support.  AXIS V: Functioning at time of discharge 60.   ____________________________ Gonzella Lex, MD jtc:cs D: 01/21/2014 12:22:23 ET T: 01/21/2014 20:32:53 ET JOB#: [id number]  cc: Gonzella Lex, MD, <Dictator> Gonzella Lex MD ELECTRONICALLY SIGNED 01/23/2014 17:01

## 2015-10-27 ENCOUNTER — Telehealth: Payer: Self-pay | Admitting: Internal Medicine

## 2015-10-28 NOTE — Telephone Encounter (Signed)
error 

## 2016-07-20 DIAGNOSIS — E221 Hyperprolactinemia: Secondary | ICD-10-CM

## 2016-07-20 DIAGNOSIS — Z8669 Personal history of other diseases of the nervous system and sense organs: Secondary | ICD-10-CM

## 2016-07-20 DIAGNOSIS — F319 Bipolar disorder, unspecified: Secondary | ICD-10-CM

## 2016-08-31 ENCOUNTER — Encounter: Payer: Self-pay | Admitting: *Deleted

## 2016-08-31 ENCOUNTER — Ambulatory Visit
Admission: EM | Admit: 2016-08-31 | Discharge: 2016-08-31 | Disposition: A | Discharge status: Home / Self Care | Payer: Worker's Compensation | Attending: Internal Medicine | Admitting: Internal Medicine

## 2016-08-31 DIAGNOSIS — M461 Sacroiliitis, not elsewhere classified: Secondary | ICD-10-CM

## 2016-08-31 MED ORDER — TIZANIDINE HCL 4 MG PO TABS: 4.0000 mg | ORAL_TABLET | Freq: Four times a day (QID) | ORAL | 0 refills | Status: DC | PRN

## 2016-08-31 MED ORDER — NAPROXEN 500 MG PO TABS: 500.0000 mg | ORAL_TABLET | Freq: Two times a day (BID) | ORAL | 0 refills | Status: DC

## 2016-08-31 NOTE — ED Provider Notes (Signed)
CSN: [id number]     Arrival date & time 08/31/16  1321 History   First MD Initiated Contact with Patient 08/31/16 1352     Chief Complaint  Patient presents with  . Back Pain   (Consider location/radiation/quality/duration/timing/severity/associated sxs/prior Treatment) HPI This is a 41 year old female who presents withWork related injury to her low back. He states that last night she was lifting a box at the same time bending and twisting to put it into position on a smaller cart. She was holding this the box when it slipped and fell and hit her leg and she then bent forward more to keep it from sliding she felt the pain in her left sacroiliac area. Since that time the pain has been fairly constant and is radiating into her left buttock. She has a previous history to her low back causing "nerve damage". She denies any incontinence.      Past Medical History:  Diagnosis Date  . Allergy    Seasonal, Ultram (seizures)  . Anemia 2005  . Glaucoma   . Seizures ([place]) 2008   Past Surgical History:  Procedure Laterality Date  . BREAST BIOPSY Right    x 2  . CESAREAN SECTION     x 2   Family History  Problem Relation Age of Onset  . Hypertension Mother   . Hyperlipidemia Mother   . Parkinson's disease Father    Social History  Substance Use Topics  . Smoking status: Current Every Day Smoker    Packs/day: 0.50    Years: 9.00  . Smokeless tobacco: Never Used  . Alcohol use No     Comment: Ocassional   OB History    No data available     Review of Systems  Constitutional: Positive for activity change. Negative for chills, fatigue and fever.  Musculoskeletal: Positive for back pain and myalgias.  All other systems reviewed and are negative.   Allergies  Ultram [tramadol]  Home Medications   Prior to Admission medications   Medication Sig Start Date End Date Taking? Authorizing Provider  buPROPion (WELLBUTRIN XL) 300 MG 24 hr tablet Take 300 mg by mouth daily.   Yes  Historical Provider, MD  venlafaxine (EFFEXOR) 75 MG tablet Take 75 mg by mouth 2 (two) times daily.   Yes Historical Provider, MD  naproxen (NAPROSYN) 500 MG tablet Take 1 tablet (500 mg total) by mouth 2 (two) times daily. 08/31/16   Lorin Picket, PA-C  tiZANidine (ZANAFLEX) 4 MG tablet Take 1 tablet (4 mg total) by mouth every 6 (six) hours as needed for muscle spasms. 08/31/16   Lorin Picket, PA-C   Meds Ordered and Administered this Visit  Medications - No data to display  BP 103/67 (BP Location: Left Arm)   Pulse 82   Temp 97.9 F (36.6 C) (Oral)   Resp 16   Ht 5\' 2"  (1.575 m)   Wt 100 lb (45.4 kg)   SpO2 96%   BMI 18.29 kg/m  No data found.   Physical Exam  Constitutional: She is oriented to person, place, and time. She appears well-developed and well-nourished. No distress.  HENT:  Head: Normocephalic and atraumatic.  Eyes: EOM are normal. Pupils are equal, round, and reactive to light.  Neck: Normal range of motion. Neck supple.  Musculoskeletal: She exhibits tenderness.  Examination of the lumbar spine was performed with Lenna Sciara, CMA chaperone. The patient has a level pelvis in stance. She has tenderness is sharply localized over the left  sacroiliac joint reproducing her symptoms. For flexion shows the patient be able to forward flex with the hands at the level of the knee. Lateral flexion is smooth but with discomfort at the extremes particularly towards the left. Thoracic rotation is full and painful to the left. Patient is able to toe and heel walk adequately. Sensation to light touch shows a slight hip esthesia on the left lateral calf. EHL peroneal and anterior tibialis is strong. TR's are 2+ over 4 and bilaterally symmetrical. Her leg raise testing is positive in the seated position to 80 on the left with left buttock pain and to 80 on the right with low back pain.  Neurological: She is alert and oriented to person, place, and time. She has normal reflexes. She  displays normal reflexes. She exhibits normal muscle tone. Coordination normal.  Skin: Skin is warm and dry. She is not diaphoretic.  Psychiatric: She has a normal mood and affect. Her behavior is normal. Judgment and thought content normal.  Nursing note and vitals reviewed.   Urgent Care Course   Clinical Course    Procedures (including critical care time)  Labs Review Labs Reviewed - No data to display  Imaging Review No results found.   Visual Acuity Review  Right Eye Distance:   Left Eye Distance:   Bilateral Distance:    Right Eye Near:   Left Eye Near:    Bilateral Near:     Patient refused injection of Toradol for pain.ed    MDM   1. Sacroiliitis [facility])    Discharge Medication List as of 08/31/2016  2:49 PM    START taking these medications   Details  naproxen (NAPROSYN) 500 MG tablet Take 1 tablet (500 mg total) by mouth 2 (two) times daily., Starting Tue 08/31/2016, Normal    tiZANidine (ZANAFLEX) 4 MG tablet Take 1 tablet (4 mg total) by mouth every 6 (six) hours as needed for muscle spasms., Starting Tue 08/31/2016, Normal      Plan: 1. Test/x-ray results and diagnosis reviewed with patient 2. rx as per orders; risks, benefits, potential side effects reviewed with patient 3. Recommend supportive treatment with Rest and symptom avoidance. She was given restrictions for 1 week duration. She will follow-up with Rosana Hoes FNP at [facility] occupational health. 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 F/u prn if symptoms worsen or don't improve     Lorin Picket, PA-C 08/31/16 [place] Vaishnavi Dal[place], [place] 08/31/16 1623

## 2016-08-31 NOTE — ED Triage Notes (Signed)
While at work, pt was lifting a large box which began to fall and when she moved to stop it she had immediate pain in her left LS spine region. Pain has persisted through night and is worse today. Pain now radiates to left leg.

## 2016-09-02 ENCOUNTER — Telehealth: Payer: Self-pay | Admitting: *Deleted

## 2017-06-24 ENCOUNTER — Encounter: Payer: Self-pay | Admitting: *Deleted

## 2017-06-24 ENCOUNTER — Emergency Department
Admission: EM | Admit: 2017-06-24 | Discharge: 2017-06-26 | Disposition: A | Discharge status: Other Acute Inpatient Hospital | Payer: BLUE CROSS/BLUE SHIELD | Attending: Emergency Medicine | Admitting: Emergency Medicine

## 2017-06-24 DIAGNOSIS — Z046 Encounter for general psychiatric examination, requested by authority: Secondary | ICD-10-CM | POA: Diagnosis present

## 2017-06-24 DIAGNOSIS — F919 Conduct disorder, unspecified: Secondary | ICD-10-CM | POA: Diagnosis not present

## 2017-06-24 DIAGNOSIS — Z79899 Other long term (current) drug therapy: Secondary | ICD-10-CM | POA: Insufficient documentation

## 2017-06-24 DIAGNOSIS — R4689 Other symptoms and signs involving appearance and behavior: Secondary | ICD-10-CM

## 2017-06-24 DIAGNOSIS — F17210 Nicotine dependence, cigarettes, uncomplicated: Secondary | ICD-10-CM | POA: Insufficient documentation

## 2017-06-24 DIAGNOSIS — F319 Bipolar disorder, unspecified: Secondary | ICD-10-CM | POA: Insufficient documentation

## 2017-06-24 HISTORY — DX: Bipolar disorder, unspecified: F31.9

## 2017-06-24 LAB — URINE DRUG SCREEN, QUALITATIVE (ARMC ONLY)
AMPHETAMINES, UR SCREEN: NOT DETECTED
Barbiturates, Ur Screen: NOT DETECTED
Benzodiazepine, Ur Scrn: NOT DETECTED
COCAINE METABOLITE, UR ~~LOC~~: NOT DETECTED
Cannabinoid 50 Ng, Ur ~~LOC~~: NOT DETECTED
MDMA (ECSTASY) UR SCREEN: NOT DETECTED
METHADONE SCREEN, URINE: NOT DETECTED
OPIATE, UR SCREEN: NOT DETECTED
Phencyclidine (PCP) Ur S: NOT DETECTED
TRICYCLIC, UR SCREEN: NOT DETECTED

## 2017-06-24 LAB — CBC
HEMATOCRIT: 40.7 % (ref 35.0–47.0)
HEMOGLOBIN: 14.1 g/dL (ref 12.0–16.0)
MCH: 31 pg (ref 26.0–34.0)
MCHC: 34.6 g/dL (ref 32.0–36.0)
MCV: 89.6 fL (ref 80.0–100.0)
Platelets: 230 10*3/uL (ref 150–440)
RBC: 4.54 MIL/uL (ref 3.80–5.20)
RDW: 12.6 % (ref 11.5–14.5)
WBC: 16.5 10*3/uL — AB (ref 3.6–11.0)

## 2017-06-24 LAB — ETHANOL: Alcohol, Ethyl (B): 18 mg/dL — ABNORMAL HIGH (ref <5)

## 2017-06-24 LAB — COMPREHENSIVE METABOLIC PANEL
ALBUMIN: 4.6 g/dL (ref 3.5–5.0)
ALK PHOS: 60 U/L (ref 38–126)
ALT: 17 U/L (ref 14–54)
ANION GAP: 8 (ref 5–15)
AST: 17 U/L (ref 15–41)
BUN: 9 mg/dL (ref 6–20)
CALCIUM: 9.2 mg/dL (ref 8.9–10.3)
CHLORIDE: 107 mmol/L (ref 101–111)
CO2: 23 mmol/L (ref 22–32)
Creatinine, Ser: 0.67 mg/dL (ref 0.44–1.00)
GFR calc Af Amer: >60 mL/min (ref >60)
GFR calc non Af Amer: >60 mL/min (ref >60)
GLUCOSE: 147 mg/dL — AB (ref 65–99)
Potassium: 3.5 mmol/L (ref 3.5–5.1)
SODIUM: 138 mmol/L (ref 135–145)
Total Bilirubin: 0.6 mg/dL (ref 0.3–1.2)
Total Protein: 6.8 g/dL (ref 6.5–8.1)

## 2017-06-24 LAB — ACETAMINOPHEN LEVEL

## 2017-06-24 LAB — SALICYLATE LEVEL

## 2017-06-24 LAB — POCT PREGNANCY, URINE: Preg Test, Ur: NEGATIVE

## 2017-06-24 MED ORDER — LORAZEPAM 2 MG PO TABS
2.0000 mg | ORAL_TABLET | Freq: Once | ORAL | Status: AC
  Administered 2017-06-25: 2 mg via ORAL

## 2017-06-24 MED ORDER — LORAZEPAM 1 MG PO TABS
ORAL_TABLET | ORAL | Status: AC
  Filled 2017-06-24: qty 2

## 2017-06-24 MED ORDER — LORAZEPAM 2 MG/ML IJ SOLN
INTRAMUSCULAR | Status: AC
  Filled 2017-06-24: qty 1

## 2017-06-24 MED ORDER — HALOPERIDOL 5 MG PO TABS
ORAL_TABLET | ORAL | Status: AC
  Filled 2017-06-24: qty 1

## 2017-06-24 MED ORDER — DIPHENHYDRAMINE HCL 50 MG/ML IJ SOLN
INTRAMUSCULAR | Status: AC
  Filled 2017-06-24: qty 1

## 2017-06-24 MED ORDER — HALOPERIDOL LACTATE 5 MG/ML IJ SOLN
INTRAMUSCULAR | Status: AC
  Filled 2017-06-24: qty 1

## 2017-06-24 NOTE — ED Triage Notes (Signed)
Pt is under IVC, exhibiting erratic behavior. Pt is no longer taking meds for her multiple psychiatric disorders.

## 2017-06-24 NOTE — ED Provider Notes (Signed)
[facility] Emergency Department Provider Note   ____________________________________________   I have reviewed the triage vital signs and the nursing notes.   HISTORY  Chief Complaint Medical Clearance   History limited by: psychosis   HPI Autumn [name] is a 41 y.o. female who presents to the emergency department today because of concerns for abnormal behavior. Patient came in under IVC come in by police. The patient herself states that she was trying to live by herself for the past couple of days but was not successful. She talks about trying to keep alack shadow from getting her. The patient states that she is supposed be on some medications but is currently not taking them.   Past Medical History:  Diagnosis Date  . Allergy    Seasonal, Ultram (seizures)  . Anemia 2005  . Glaucoma   . Seizures ([place]) 2008    Patient Active Problem List   Diagnosis Date Noted  . 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 Hx of glaucoma 10/02/2013  . Bipolar 1 disorder ([place]) 10/02/2013    Past Surgical History:  Procedure Laterality Date  . BREAST BIOPSY Right    x 2  . CESAREAN SECTION     x 2    Prior to Admission medications   Medication Sig Start Date End Date Taking? Authorizing Provider  buPROPion (WELLBUTRIN XL) 300 MG 24 hr tablet Take 300 mg by mouth daily.    [provider]  naproxen (NAPROSYN) 500 MG tablet Take 1 tablet (500 mg total) by mouth 2 (two) times daily. Patient not taking: Reported on 06/24/2017 08/31/16   Crecencio Mc P, PA-C  tiZANidine (ZANAFLEX) 4 MG tablet Take 1 tablet (4 mg total) by mouth every 6 (six) hours as needed for muscle spasms. Patient not taking: Reported on 06/24/2017 08/31/16   Lorin Picket, PA-C  venlafaxine [facility]) 75 MG tablet Take 75 mg by mouth 2 (two) times daily.    [provider]    Allergies Ultram [tramadol]  Family History  Problem Relation Age of Onset  . Hypertension  Mother   . Hyperlipidemia Mother   . Parkinson's disease Father     Social History Social History  Substance Use Topics  . Smoking status: Current Every Day Smoker    Packs/day: 0.50    Years: 9.00  . Smokeless tobacco: Never Used  . Alcohol use No     Comment: Ocassional    Review of Systems Constitutional: No fever/chills Eyes: No visual changes. ENT: No sore throat. Cardiovascular: Denies chest pain. Respiratory: Denies shortness of breath. Gastrointestinal: No abdominal pain.  No nausea, no vomiting.  No diarrhea.   Genitourinary: Negative for dysuria. Musculoskeletal: positive for chronic back pain Skin: Negative for rash. Neurological: Negative for headaches, focal weakness or numbness.  ____________________________________________   PHYSICAL EXAM:  VITAL SIGNS: ED Triage Vitals  Enc Vitals Group     BP --      Pulse --      Resp --      Temp --      Temp src --      SpO2 --      Weight 06/24/17 2249 85 lb (38.6 kg)     Height 06/24/17 2249 5\' 1"  (1.549 m)     Head Circumference --      Peak Flow --      Pain Score 06/24/17 2248 0     Pain Loc --      Pain Edu? --  Excl. in [place]? --      Constitutional: awake and alert Eyes: Conjunctivae are normal.  ENT   Head: Normocephalic and atraumatic.   Nose: No congestion/rhinnorhea.   Mouth/Throat: Mucous membranes are moist.   Neck: No stridor. Hematological/Lymphatic/Immunilogical: No cervical lymphadenopathy. Cardiovascular: Normal rate, regular rhythm.  No murmurs, rubs, or gallops. Respiratory: Normal respiratory effort without tachypnea nor retractions. Breath sounds are clear and equal bilaterally. No wheezes/rales/rhonchi. Gastrointestinal: Soft and non tender. No rebound. No guarding.  Genitourinary: Deferred Musculoskeletal: Normal range of motion in all extremities. No lower extremity edema. Neurologic:  Normal speech and language. No gross focal neurologic deficits are  appreciated.  Skin:  Skin is warm, dry and intact. No rash noted. Psychiatric: emotionally labile. Alternating between being calm and yelling.  ____________________________________________    LABS (pertinent positives/negatives)  Labs Reviewed  COMPREHENSIVE METABOLIC PANEL - Abnormal; Notable for the following:       Result Value   Glucose, Bld 147 (*)    All other components within normal limits  ETHANOL - Abnormal; Notable for the following:    Alcohol, Ethyl (B) 18 (*)    All other components within normal limits  ACETAMINOPHEN LEVEL - Abnormal; Notable for the following:    Acetaminophen (Tylenol), Serum <10 (*)    All other components within normal limits  CBC - Abnormal; Notable for the following:    WBC 16.5 (*)    All other components within normal limits  SALICYLATE LEVEL  URINE DRUG SCREEN, QUALITATIVE ([facility] ONLY)  POCT PREGNANCY, URINE     ____________________________________________   EKG  None  ____________________________________________    RADIOLOGY  None  ____________________________________________   PROCEDURES  Procedures  ____________________________________________   INITIAL IMPRESSION / ASSESSMENT AND PLAN / ED COURSE  Pertinent labs & imaging results that were available during my care of the patient were reviewed by me and considered in my medical decision making (see chart for details).  Patient presents to the emergency department today under IVC because of concerns for abnormal behavior. On exam here she is somewhat emotionally labile. While here in the emergency department she did start becoming more aggressive and yelling out. She was given medications to try to help calm her down.Will have psychiatry evaluate.  ____________________________________________   FINAL CLINICAL IMPRESSION(S) / ED DIAGNOSES  Abnormal behavior  Note: This dictation was prepared with Dragon dictation. Any transcriptional errors that result from  this process are unintentional     Nance Pear, MD 06/25/17 [phone]

## 2017-06-24 NOTE — ED Notes (Signed)
Dressed pt out in [employer].  Pts belongings include shortsm underwear, socks, sneakers, 2 shirts, green sweater, sports bra.  Pt has black purse, a stuffed bunny with a shirt on it, a back pack, and a suitcase.  3 earrings, watch and necklace that pt witnessed me putting in a cup, labeling it, and placing cup in her shoe.  Pt sitting in subwait with [employer] awaiting a bed.  labs and urine sent

## 2017-06-25 MED ORDER — LORAZEPAM 2 MG/ML IJ SOLN
INTRAMUSCULAR | Status: AC
  Administered 2017-06-25: 2 mg via INTRAMUSCULAR
  Filled 2017-06-25: qty 1

## 2017-06-25 MED ORDER — LORAZEPAM 2 MG/ML IJ SOLN
2.0000 mg | Freq: Once | INTRAMUSCULAR | Status: AC
  Administered 2017-06-25: 2 mg via INTRAMUSCULAR

## 2017-06-25 MED ORDER — DIPHENHYDRAMINE HCL 50 MG/ML IJ SOLN
INTRAMUSCULAR | Status: AC
  Administered 2017-06-25: 25 mg via INTRAMUSCULAR
  Filled 2017-06-25: qty 1

## 2017-06-25 MED ORDER — MAGNESIUM SULFATE 2 GM/50ML IV SOLN
INTRAVENOUS | Status: AC
  Filled 2017-06-25: qty 50

## 2017-06-25 MED ORDER — HALOPERIDOL LACTATE 5 MG/ML IJ SOLN
INTRAMUSCULAR | Status: AC
  Administered 2017-06-25: 5 mg via INTRAMUSCULAR
  Filled 2017-06-25: qty 1

## 2017-06-25 MED ORDER — HALOPERIDOL LACTATE 5 MG/ML IJ SOLN
5.0000 mg | Freq: Once | INTRAMUSCULAR | Status: AC
  Administered 2017-06-25: 5 mg via INTRAMUSCULAR

## 2017-06-25 MED ORDER — DIPHENHYDRAMINE HCL 50 MG/ML IJ SOLN
25.0000 mg | Freq: Once | INTRAMUSCULAR | Status: AC
  Administered 2017-06-25: 25 mg via INTRAMUSCULAR

## 2017-06-25 NOTE — ED Notes (Addendum)
Patient calm and cooperative, denies thoughts of si/hi or avh at this time, she has supper tray and wanted to watch tv, nurse oriented her to unit and she said she had been here before and knows where and how things are, patient is aware that she will be inpatient when bed available, Patient shows no interest in engaging in conversation,  Patient with q 15 minute checks and camera surveillance in progress for safety.

## 2017-06-25 NOTE — ED Notes (Signed)

## 2017-06-25 NOTE — ED Notes (Signed)
Pt. Moved into room #22.Marland Kitchen  Pt. Given mattress, blankets and pillow in room.  Pt. Sleeping on mattress.

## 2017-06-25 NOTE — ED Notes (Signed)
Pt. Up from bed.  Pt. Requesting to walk around hospital.  Pt. Told she can not leave area.  Pt. Given crackers and water.

## 2017-06-25 NOTE — ED Notes (Signed)
Pt. Requested and was given a meal tray and something to drink.

## 2017-06-25 NOTE — ED Notes (Signed)
Pt. Continued to try to leave quad area, while being told that she could not leave.  Pt. Taken into room #22 by [employer].  Dr. Archie Balboa notified, Archie Balboa gave orders for IM medication.

## 2017-06-25 NOTE — ED Notes (Signed)
Pt. Climbing to the top of bed and standing on top of bed.  Pt. Trying to run from bed after repeatedly being told that she needs to stay.  Pt. Finally agreed to take oral medication.

## 2017-06-25 NOTE — ED Notes (Signed)
Received report from [facility] in [place], patient is transferring to room 8.

## 2017-06-25 NOTE — ED Notes (Signed)
Report was received from Laymond Purser., RN; Pt. Verbalizes no complaints and having distress about missing work; denies S.I./Hi. Continue to monitor with 15 min. Monitoring.

## 2017-06-25 NOTE — ED Notes (Signed)
Pt. Up from bed, pt. Requesting to use bathroom.  Pt. Gave urine specimen.  Pt. Did not want officer near while she got into bed.  Pt. Jumped into bed.  Pt. Given another blanket.  Pt. Calm at this time.

## 2017-06-25 NOTE — ED Notes (Signed)
Patient received PM snack. 

## 2017-06-25 NOTE — ED Notes (Signed)
Pt given meal tray, pt noted to be sleeping but awoke when this RN entered room. Pt stated "oh shit oh shit oh shit", then rolled over and went back to sleep. Lights dimmed for patient comfort. Will continue to monitor for further patient needs.

## 2017-06-26 ENCOUNTER — Inpatient Hospital Stay
Admission: AD | Admit: 2017-06-26 | Discharge: 2017-07-01 | DRG: 885 | Disposition: A | Discharge status: Home / Self Care | Payer: BLUE CROSS/BLUE SHIELD | Attending: Psychiatry | Admitting: Psychiatry

## 2017-06-26 DIAGNOSIS — F1220 Cannabis dependence, uncomplicated: Secondary | ICD-10-CM | POA: Diagnosis present

## 2017-06-26 DIAGNOSIS — R197 Diarrhea, unspecified: Secondary | ICD-10-CM | POA: Diagnosis not present

## 2017-06-26 DIAGNOSIS — N912 Amenorrhea, unspecified: Secondary | ICD-10-CM | POA: Diagnosis present

## 2017-06-26 DIAGNOSIS — E221 Hyperprolactinemia: Secondary | ICD-10-CM | POA: Diagnosis present

## 2017-06-26 DIAGNOSIS — F19239 Other psychoactive substance dependence with withdrawal, unspecified: Secondary | ICD-10-CM | POA: Diagnosis present

## 2017-06-26 DIAGNOSIS — F603 Borderline personality disorder: Secondary | ICD-10-CM | POA: Diagnosis present

## 2017-06-26 DIAGNOSIS — F919 Conduct disorder, unspecified: Secondary | ICD-10-CM | POA: Diagnosis not present

## 2017-06-26 DIAGNOSIS — R110 Nausea: Secondary | ICD-10-CM | POA: Diagnosis not present

## 2017-06-26 DIAGNOSIS — F17210 Nicotine dependence, cigarettes, uncomplicated: Secondary | ICD-10-CM | POA: Diagnosis present

## 2017-06-26 DIAGNOSIS — F4310 Post-traumatic stress disorder, unspecified: Secondary | ICD-10-CM | POA: Diagnosis present

## 2017-06-26 DIAGNOSIS — Z590 Homelessness: Secondary | ICD-10-CM | POA: Diagnosis not present

## 2017-06-26 DIAGNOSIS — Z915 Personal history of self-harm: Secondary | ICD-10-CM

## 2017-06-26 DIAGNOSIS — Z885 Allergy status to narcotic agent status: Secondary | ICD-10-CM

## 2017-06-26 DIAGNOSIS — Z888 Allergy status to other drugs, medicaments and biological substances status: Secondary | ICD-10-CM

## 2017-06-26 DIAGNOSIS — F1510 Other stimulant abuse, uncomplicated: Secondary | ICD-10-CM

## 2017-06-26 DIAGNOSIS — F1120 Opioid dependence, uncomplicated: Secondary | ICD-10-CM | POA: Diagnosis present

## 2017-06-26 DIAGNOSIS — F1520 Other stimulant dependence, uncomplicated: Secondary | ICD-10-CM

## 2017-06-26 DIAGNOSIS — Z62810 Personal history of physical and sexual abuse in childhood: Secondary | ICD-10-CM | POA: Diagnosis present

## 2017-06-26 DIAGNOSIS — F259 Schizoaffective disorder, unspecified: Secondary | ICD-10-CM | POA: Diagnosis present

## 2017-06-26 DIAGNOSIS — G4700 Insomnia, unspecified: Secondary | ICD-10-CM | POA: Diagnosis present

## 2017-06-26 DIAGNOSIS — F17200 Nicotine dependence, unspecified, uncomplicated: Secondary | ICD-10-CM

## 2017-06-26 DIAGNOSIS — F319 Bipolar disorder, unspecified: Secondary | ICD-10-CM | POA: Diagnosis present

## 2017-06-26 DIAGNOSIS — R309 Painful micturition, unspecified: Secondary | ICD-10-CM | POA: Diagnosis not present

## 2017-06-26 DIAGNOSIS — F411 Generalized anxiety disorder: Secondary | ICD-10-CM | POA: Diagnosis present

## 2017-06-26 MED ORDER — PALIPERIDONE ER 3 MG PO TB24: 3.0000 mg | ORAL_TABLET | Freq: Every day | ORAL | Status: DC

## 2017-06-26 MED ORDER — ALUM & MAG HYDROXIDE-SIMETH 200-200-20 MG/5ML PO SUSP: 30.0000 mL | ORAL | Status: DC | PRN

## 2017-06-26 MED ORDER — VENLAFAXINE HCL 75 MG PO TABS: 75.0000 mg | ORAL_TABLET | Freq: Two times a day (BID) | ORAL | Status: DC

## 2017-06-26 MED ORDER — NICOTINE 21 MG/24HR TD PT24
21.0000 mg | MEDICATED_PATCH | Freq: Once | TRANSDERMAL | Status: DC
  Administered 2017-06-26: 21 mg via TRANSDERMAL

## 2017-06-26 MED ORDER — BUPROPION HCL ER (XL) 300 MG PO TB24: 300.0000 mg | ORAL_TABLET | Freq: Every day | ORAL | Status: DC

## 2017-06-26 MED ORDER — LITHIUM CARBONATE ER 300 MG PO TBCR
300.0000 mg | EXTENDED_RELEASE_TABLET | Freq: Two times a day (BID) | ORAL | Status: DC
  Administered 2017-06-26: 300 mg via ORAL
  Filled 2017-06-26: qty 1

## 2017-06-26 MED ORDER — NICOTINE 21 MG/24HR TD PT24
MEDICATED_PATCH | TRANSDERMAL | Status: AC
  Administered 2017-06-26: 21 mg via TRANSDERMAL
  Filled 2017-06-26: qty 1

## 2017-06-26 MED ORDER — TRAZODONE HCL 100 MG PO TABS
100.0000 mg | ORAL_TABLET | Freq: Every evening | ORAL | Status: DC | PRN
  Administered 2017-06-26: 100 mg via ORAL
  Filled 2017-06-26: qty 1

## 2017-06-26 MED ORDER — NAPROXEN 500 MG PO TABS
500.0000 mg | ORAL_TABLET | Freq: Two times a day (BID) | ORAL | Status: DC | PRN
  Filled 2017-06-26: qty 1

## 2017-06-26 MED ORDER — CARBAMAZEPINE 200 MG PO TABS
200.0000 mg | ORAL_TABLET | Freq: Three times a day (TID) | ORAL | Status: DC
  Administered 2017-06-26 – 2017-06-27 (×2): 200 mg via ORAL
  Filled 2017-06-26 (×3): qty 1

## 2017-06-26 MED ORDER — ACETAMINOPHEN 325 MG PO TABS
650.0000 mg | ORAL_TABLET | Freq: Four times a day (QID) | ORAL | Status: DC | PRN
  Administered 2017-06-27: 650 mg via ORAL
  Filled 2017-06-26: qty 2

## 2017-06-26 MED ORDER — CARBAMAZEPINE 200 MG PO TABS: 200.0000 mg | ORAL_TABLET | Freq: Three times a day (TID) | ORAL | Status: DC

## 2017-06-26 MED ORDER — HYDROXYZINE HCL 25 MG PO TABS
25.0000 mg | ORAL_TABLET | Freq: Three times a day (TID) | ORAL | Status: DC | PRN
  Administered 2017-06-28 – 2017-06-30 (×3): 25 mg via ORAL
  Filled 2017-06-26 (×3): qty 1

## 2017-06-26 MED ORDER — LITHIUM CARBONATE ER 300 MG PO TBCR
300.0000 mg | EXTENDED_RELEASE_TABLET | Freq: Two times a day (BID) | ORAL | Status: DC
  Administered 2017-06-26 – 2017-06-29 (×6): 300 mg via ORAL
  Filled 2017-06-26 (×6): qty 1

## 2017-06-26 MED ORDER — MAGNESIUM HYDROXIDE 400 MG/5ML PO SUSP: 30.0000 mL | Freq: Every day | ORAL | Status: DC | PRN

## 2017-06-26 NOTE — Tx Team (Signed)
Initial Treatment Plan 06/26/2017 4:03 PM Autumn [name] [id number]    PATIENT STRESSORS: Financial difficulties Loss of family support Medication change or noncompliance   PATIENT STRENGTHS: Ability for insight Communication skills Motivation for treatment/growth   PATIENT IDENTIFIED PROBLEMS: Schizophrenia  Substance Abuse                   DISCHARGE CRITERIA:  Ability to meet basic life and health needs Adequate post-discharge living arrangements Improved stabilization in mood, thinking, and/or behavior  PRELIMINARY DISCHARGE PLAN: Outpatient therapy Placement in alternative living arrangements  PATIENT/FAMILY INVOLVEMENT: This treatment plan has been presented to and reviewed with the patient, Autumn [name], and/or family member, .  The patient and family have been given the opportunity to ask questions and make suggestions.  [name], RN 06/26/2017, 4:03 PM

## 2017-06-26 NOTE — BHH Group Notes (Signed)
[place] Group Notes:  (Nursing/MHT/Case Management/Adjunct)  Date:  06/26/2017  Time:  9:12 PM  Type of Therapy:  Group Therapy  Participation Level:  Active  Participation Quality:  Appropriate  Affect:  Appropriate  Cognitive:  Alert  Insight:  Appropriate  Engagement in Group:  Engaged  Modes of Intervention:  Support  Summary of Progress/Problems:  Nehemiah Settle 06/26/2017, 9:12 PM

## 2017-06-26 NOTE — Plan of Care (Signed)
Problem: Safety: Goal: Periods of time without injury will increase Outcome: Progressing Behavior was calm although affect was preoccupied most of this evening.  Patient contracts for safety and was without evidence of bizarre behavior this evening.  Problem: Coping: Goal: Ability to identify and develop effective coping behavior will improve Outcome: Progressing Patient was observed sitting in her room in the bed on initial rounds.  She was pleasant on contact and asked appropriate questions for orientation to the unit.  She attended wrap up group and HS snack.

## 2017-06-26 NOTE — ED Notes (Signed)
Called nursing report to [place] at [facility].  Patient to arrive shortly.  Patient is going to bed 316.

## 2017-06-26 NOTE — BH Assessment (Signed)
Per [facility] patient meets inpatient criteria.  Writer spoke with [facility] Attending Physician (Dr. Weber Cooks) about the patient and he will put in the admission orders.  Attending Physician will be Dr. Bary Leriche.   Patient has been assigned to room 316, by [facility] Charge Nurse Tiffany.   Intake Paper Work has been signed and placed on patient chart.  ER staff is aware of the admission Linus Orn, ER Sect.; Dr. Archie Balboa, ER MD; Chrys Racer, Patient's Nurse & Karen Chafe  Patient Access).

## 2017-06-26 NOTE — ED Provider Notes (Signed)
-----------------------------------------   7:32 AM on 06/26/2017 -----------------------------------------   Blood pressure 106/79, pulse 99, temperature 97.8 F (36.6 C), temperature source Oral, resp. rate 18, height 5\' 1"  (1.549 m), weight 38.6 kg (85 lb), last menstrual period 05/08/2017, SpO2 99 %.  The patient had no acute events since last update.  Calm and cooperative at this time.  Disposition is pending per Psychiatry/Behavioral Medicine team recommendations.     Alfred Levins, [place], MD 06/26/17 [phone]

## 2017-06-26 NOTE — ED Notes (Signed)
Patient is sitting up in bed.  She is alert and oriented.  Patient states she was not on any medication prior to admission.  Asked if she smoked and she admitted she did.  Asked if she wanted a patch and she replied, "that might not be a good idea.  The voices ate up the last one I had."  Patient requested some juice and when lunch would be available.  She is calm; her behavior is appropriate at this time.  She presents with disheveled appearance and irritable mood.  She is not forthcoming with information about herself; she remains minimal, but cooperative.  She denies any thoughts of self harm.

## 2017-06-26 NOTE — Progress Notes (Signed)
Patient requested the use of the phone.  She has been calm and cooperative.
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
## 2017-06-26 NOTE — ED Notes (Signed)
Patient alert and oriented; eating breakfast.

## 2017-06-26 NOTE — Progress Notes (Signed)
41 year old female admitted to unit. Alert and oriented x4. Denies SI/HI, AVH at this time.  Patient reports that current stressors are being homeless, loss of family support and the loss of her best friend 4 years ago. Oriented patient to room and unit. Skin and contraband search completed and witnessed by Shane Crutch, Therapist, sports. Multiple tattoos to body observed, skin otherwise warm, dry and intact. No contraband found on patient nor belongings. Home medications sent to pharmacy for storage. Admission assessment completed, fluid and nutrition offered. Patient remains safe on the unit with q 15 minute checks.

## 2017-06-27 DIAGNOSIS — F1510 Other stimulant abuse, uncomplicated: Secondary | ICD-10-CM

## 2017-06-27 DIAGNOSIS — F1220 Cannabis dependence, uncomplicated: Secondary | ICD-10-CM

## 2017-06-27 DIAGNOSIS — F1520 Other stimulant dependence, uncomplicated: Secondary | ICD-10-CM

## 2017-06-27 DIAGNOSIS — F1120 Opioid dependence, uncomplicated: Secondary | ICD-10-CM

## 2017-06-27 DIAGNOSIS — F17200 Nicotine dependence, unspecified, uncomplicated: Secondary | ICD-10-CM

## 2017-06-27 LAB — HEMOGLOBIN A1C
Hgb A1c MFr Bld: 5 % (ref 4.8–5.6)
Mean Plasma Glucose: 96.8 mg/dL

## 2017-06-27 LAB — LIPID PANEL
CHOLESTEROL: 152 mg/dL (ref 0–200)
HDL: 59 mg/dL (ref >40)
LDL CALC: 79 mg/dL (ref 0–99)
TRIGLYCERIDES: 68 mg/dL (ref <150)
Total CHOL/HDL Ratio: 2.6 RATIO
VLDL: 14 mg/dL (ref 0–40)

## 2017-06-27 LAB — GLUCOSE, CAPILLARY: Glucose-Capillary: 93 mg/dL (ref 65–99)

## 2017-06-27 LAB — TSH: TSH: 0.875 u[IU]/mL (ref 0.350–4.500)

## 2017-06-27 MED ORDER — NICOTINE 21 MG/24HR TD PT24
21.0000 mg | MEDICATED_PATCH | Freq: Every day | TRANSDERMAL | Status: DC
  Administered 2017-06-27 – 2017-07-01 (×5): 21 mg via TRANSDERMAL
  Filled 2017-06-27 (×5): qty 1

## 2017-06-27 MED ORDER — CITALOPRAM HYDROBROMIDE 20 MG PO TABS
20.0000 mg | ORAL_TABLET | Freq: Every day | ORAL | Status: DC
  Administered 2017-06-27 – 2017-07-01 (×5): 20 mg via ORAL
  Filled 2017-06-27 (×5): qty 1

## 2017-06-27 MED ORDER — CARBAMAZEPINE 200 MG PO TABS
200.0000 mg | ORAL_TABLET | Freq: Two times a day (BID) | ORAL | Status: DC
  Administered 2017-06-27 – 2017-06-29 (×4): 200 mg via ORAL
  Filled 2017-06-27 (×3): qty 1

## 2017-06-27 MED ORDER — TRAZODONE HCL 100 MG PO TABS
100.0000 mg | ORAL_TABLET | Freq: Every day | ORAL | Status: DC
  Administered 2017-06-27 – 2017-06-28 (×2): 100 mg via ORAL
  Filled 2017-06-27 (×2): qty 1

## 2017-06-27 NOTE — BHH Group Notes (Signed)
[place] LCSW Group Therapy   06/27/2017 9:30am  Type of Therapy: Group Therapy   Participation Level: Active   Participation Quality: Attentive, Sharing and Supportive   Affect: Appropriate    Cognitive: Alert and Oriented   Insight: Developing/Improving and Engaged   Engagement in Therapy: Developing/Improving and Engaged   Modes of Intervention: Clarification, Confrontation, Discussion, Education, Exploration,  Limit-setting, Orientation, Problem-solving, Rapport Building, Art therapist, Socialization and Support   Summary of Progress/Problems: Pt identified obstacles faced currently and processed barriers involved in overcoming these obstacles. Pt identified steps necessary for overcoming these obstacles and explored motivation (internal and external) for facing these difficulties head on. Pt further identified one area of concern in their lives and chose a goal to focus on for today. Patient defined the word obstacle and identified the obstacle that led to this hospitalization. Patient engaged in CBT exercise and identified a change plan in order to overcome obstacles.  Glorious Peach, MSW, LCSW-A 06/27/2017, 3:09PM

## 2017-06-27 NOTE — BHH Suicide Risk Assessment (Addendum)
[facility] Admission Suicide Risk Assessment   Nursing information obtained from:    Demographic factors:    Current Mental Status:    Loss Factors:    Historical Factors:    Risk Reduction Factors:     Total Time spent with patient: 1 hour Principal Problem: Bipolar 1 disorder ([place]) Diagnosis:   Patient Active Problem List   Diagnosis Date Noted  . Tobacco use disorder [F17.200] 06/27/2017  . 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 Opioid use disorder, severe, dependence ([place]) [F11.20] 06/27/2017  . Cannabis use disorder, severe, dependence ([place]) [F12.20] 06/27/2017  . Hx of glaucoma [Z86.69] 10/02/2013  . Bipolar 1 disorder ([place]) [F31.9] 10/02/2013   Subjective Data:   Continued Clinical Symptoms:  Alcohol Use Disorder Identification Test Final Score (AUDIT): 1 The "Alcohol Use Disorders Identification Test", Guidelines for Use in Primary Care, Second Edition.  World Pharmacologist [facility]). Score between 0-7:  no or low risk or alcohol related problems. Score between 8-15:  moderate risk of alcohol related problems. Score between 16-19:  high risk of alcohol related problems. Score 20 or above:  warrants further diagnostic evaluation for alcohol dependence and treatment.   CLINICAL FACTORS:   Severe Anxiety and/or Agitation Alcohol/Substance Abuse/Dependencies Chronic Pain Previous Psychiatric Diagnoses and Treatments    Psychiatric Specialty Exam: Physical Exam  ROS  Blood pressure 109/66, pulse 94, temperature 98.4 F (36.9 C), temperature source Oral, resp. rate 18, height 5\' 2"  (1.575 m), weight 41.3 kg (91 lb), last menstrual period 05/22/2017, SpO2 100 %.Body mass index is 16.64 kg/m.                                                    Sleep:  Number of Hours: 7.3      COGNITIVE FEATURES THAT CONTRIBUTE TO RISK:  Closed-mindedness    SUICIDE RISK:   Moderate:  Frequent suicidal ideation with limited intensity,  and duration, some specificity in terms of plans, no associated intent, good self-control, limited dysphoria/symptomatology, some risk factors present, and identifiable protective factors, including available and accessible social support.  PLAN OF CARE: admit   I certify that inpatient services furnished can reasonably be expected to improve the patient's condition.   Hildred Priest, MD 06/27/2017, 2:48 PM
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
## 2017-06-27 NOTE — Plan of Care (Signed)
Problem: Safety: Goal: Periods of time without injury will increase Outcome: Progressing Pt remains safe while in hospital injury free.    

## 2017-06-27 NOTE — Tx Team (Signed)
Interdisciplinary Treatment and Diagnostic Plan Update  06/27/2017 Time of Session: 11:15AM RAISA DITTO MRN: [id number]  Principal Diagnosis: <principal problem not specified>  Secondary Diagnoses: Active Problems:   Major depression   Current Medications:  Current Facility-Administered Medications  Medication Dose Route Frequency Provider Last Rate Last Dose  . acetaminophen (TYLENOL) tablet 650 mg  650 mg Oral Q6H PRN Clapacs, [name], MD      . alum & mag hydroxide-simeth (MAALOX/MYLANTA) 200-200-20 MG/5ML suspension 30 mL  30 mL Oral Q4H PRN Clapacs, [name], MD      . carbamazepine (TEGRETOL) tablet 200 mg  200 mg Oral TID Clapacs, [name], MD   200 mg at 06/27/17 0827  . hydrOXYzine (ATARAX/VISTARIL) tablet 25 mg  25 mg Oral TID PRN Clapacs, [name], MD      . lithium carbonate (LITHOBID) CR tablet 300 mg  300 mg Oral Q12H Clapacs, [name], MD   300 mg at 06/27/17 0827  . magnesium hydroxide (MILK OF MAGNESIA) suspension 30 mL  30 mL Oral Daily PRN Clapacs, [name], MD      . naproxen (NAPROSYN) tablet 500 mg  500 mg Oral BID PRN Clapacs, [name], MD      . traZODone (DESYREL) tablet 100 mg  100 mg Oral QHS PRN Clapacs, [name], MD   100 mg at 06/26/17 2133   PTA Medications: Prescriptions Prior to Admission  Medication Sig Dispense Refill Last Dose  . buPROPion (WELLBUTRIN XL) 300 MG 24 hr tablet Take 300 mg by mouth daily.   Not Taking at Unknown time  . naproxen (NAPROSYN) 500 MG tablet Take 1 tablet (500 mg total) by mouth 2 (two) times daily. (Patient not taking: Reported on 06/24/2017) 30 tablet 0 Not Taking at Unknown time  . tiZANidine (ZANAFLEX) 4 MG tablet Take 1 tablet (4 mg total) by mouth every 6 (six) hours as needed for muscle spasms. (Patient not taking: Reported on 06/24/2017) 30 tablet 0 Not Taking at Unknown time  . venlafaxine (EFFEXOR) 75 MG tablet Take 75 mg by mouth 2 (two) times daily.   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   Physician Treatment Plan for Primary Diagnosis: <principal problem not specified> Long Term Goal(s):     Short Term Goals:    Medication Management: Evaluate patient's response, side effects, and tolerance of medication regimen.  Therapeutic Interventions: 1 to 1 sessions, Unit Group sessions and Medication administration.  Evaluation of Outcomes: Progressing  Physician Treatment Plan for Secondary Diagnosis: Active Problems:   Major depression  Long Term Goal(s):     Short Term Goals:       Medication Management: Evaluate patient's response, side effects, and tolerance of medication regimen.  Therapeutic Interventions: 1 to 1 sessions, Unit Group sessions and Medication administration.  Evaluation of Outcomes: Progressing   RN Treatment Plan for Primary Diagnosis: <principal problem not specified> Long Term Goal(s): Knowledge of disease and therapeutic regimen to maintain health will improve  Short Term Goals: Ability to demonstrate self-control, Ability to disclose and discuss suicidal ideas and Compliance with prescribed medications will improve  Medication Management: RN will administer medications as ordered by provider, will assess and evaluate patient's response and provide education to patient for  prescribed medication. RN will report any adverse and/or side effects to prescribing provider.  Therapeutic Interventions: 1 on 1 counseling sessions, Psychoeducation, Medication administration, Evaluate responses to treatment, Monitor vital signs and CBGs as ordered, Perform/monitor CIWA, COWS, AIMS and Fall Risk screenings as ordered, Perform wound care  treatments as ordered.  Evaluation of Outcomes: Progressing   LCSW Treatment Plan for Primary Diagnosis: <principal problem not specified> Long Term Goal(s): Safe transition to appropriate next level of care at discharge, Engage patient in therapeutic group addressing interpersonal concerns.  Short Term Goals: Engage patient in aftercare planning with referrals and resources, Increase social support, Increase emotional regulation and Identify triggers associated with mental health/substance abuse issues  Therapeutic Interventions: Assess for all discharge needs, 1 to 1 time with Social worker, Explore available resources and support systems, Assess for adequacy in community support network, Educate family and significant other(s) on suicide prevention, Complete Psychosocial Assessment, Interpersonal group therapy.  Evaluation of Outcomes: Progressing    Recreational Therapy Treatment Plan for Primary Diagnosis: Bipolar 1 disorder ([place]) Long Term Goal(s): Patient will participate in recreation therapy treatment in at least 2 group sessions without prompting from LRT  Short Term Goals: Increase anger management skills, Increase stress management skills  Treatment Modalities: Group Therapy and Individual Treatment Sessions  Therapeutic Interventions: Psychoeducation  Evaluation of Outcomes: Progressing   Progress in Treatment: Attending groups: Yes. Participating in groups: Yes. Taking medication as prescribed: Yes. Toleration medication: Yes. Family/Significant other contact made: No, will contact:  CSW assessing appropriate contact. Patient understands diagnosis: Yes. Discussing patient identified problems/goals with staff: Yes. Medical problems stabilized or resolved: Yes. Denies suicidal/homicidal ideation: Yes. Issues/concerns per patient self-inventory: No.  New problem(s) identified: No, Describe:  None identified.  New Short Term/Long Term Goal(s): Patient stated her  goal is to get stabilize on medications.  Discharge Plan or Barriers: CSW assessing appropriate follow-up discharge plan.  Reason for Continuation of Hospitalization: Aggression Depression Hallucinations Medication stabilization  Estimated Length of Stay: 3-5 days   Attendees: Patient: Autumn [name] 06/27/2017 11:39 AM  Physician: Dr. Merlyn Albert, MD  06/27/2017 11:39 AM  Nursing:  06/27/2017 11:39 AM  RN Care Manager: 06/27/2017 11:39 AM  Social Worker: Glorious Peach, MSW, LCSW-A 06/27/2017 11:39 AM  Recreational Therapist: Secundino Ginger, LRT/CTRS  06/27/2017 11:39 AM  Other:  06/27/2017 11:39 AM  Other:  06/27/2017 11:39 AM  Other: 06/27/2017 11:39 AM    Scribe for Treatment Team: Emilie Rutter, [place] 06/27/2017 11:39 AM

## 2017-06-27 NOTE — Progress Notes (Signed)
Recreation Therapy Notes  Date: 08.20.18 Time: 1:00 pm Location: Craft Room  Group Topic: Wellness  Goal Area(s) Addresses:  Patient will identify at least one item per dimension of health. Patient will examine areas they are deficient in.  Behavioral Response: Attentive, Interactive  Intervention: 6 Dimensions of Health  Activity: Patients were given a definition sheet defining each dimension of health and a worksheet with each dimension listed. Patients were instructed to write items they were currently doing in each dimension to contribute to their wellness.  Education: LRT educated patients on ways to improve each dimension of health.  Education Outcome: Acknowledges education/In group clarification offered   Clinical Observations/Feedback: Patient wrote at least one item in each dimension. Patient contributed to group discussion by stating what area she was giving enough attention to.  Leonette Monarch, LRT/CTRS 06/27/2017 1:55 PM

## 2017-06-27 NOTE — Progress Notes (Signed)
Pt denies SI, HI, a/v hallucinations. She commits to safety on unit. Pt is seen going to groups and interacting with peers. Pt signed in voluntary & took first dose of Celexa. Will continue to monitor for safety.

## 2017-06-27 NOTE — [facility] Group Notes (Signed)
[facility] Group Notes:  (Nursing/MHT/Case Management/Adjunct)  Date:  06/27/2017  Time:  9:31 PM  Type of Therapy:  Group Therapy  Participation Level:  Active  Participation Quality:  Appropriate  Affect:  Appropriate  Cognitive:  Alert  Insight:  Appropriate  Engagement in Group:  Engaged  Modes of Intervention:  Support  Summary of Progress/Problems:  Nehemiah Settle 06/27/2017, 9:31 PM
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
## 2017-06-28 MED ORDER — LOPERAMIDE HCL 2 MG PO CAPS: 4.0000 mg | ORAL_CAPSULE | ORAL | Status: DC | PRN

## 2017-06-28 MED ORDER — ACETAMINOPHEN 500 MG PO TABS: 1000.0000 mg | ORAL_TABLET | Freq: Four times a day (QID) | ORAL | Status: DC | PRN

## 2017-06-28 MED ORDER — ENSURE ENLIVE PO LIQD
237.0000 mL | Freq: Three times a day (TID) | ORAL | Status: DC
  Administered 2017-06-28 – 2017-07-01 (×9): 237 mL via ORAL

## 2017-06-28 MED ORDER — ONDANSETRON 4 MG PO TBDP: 4.0000 mg | ORAL_TABLET | Freq: Three times a day (TID) | ORAL | Status: DC | PRN

## 2017-06-28 NOTE — Progress Notes (Deleted)
Recreation Therapy Notes  Date: 08.21.18 Time: 3:00 pm Location: Craft Room  Group Topic: Self-expression  Goal Area(s) Addresses:  Patient will be able to identify one color that represents each emotion. Patient will verbalize benefit of using art as a means of self-expression. Patient will verbalize one emotion experienced while participating in activity.  Behavioral Response: Attentive, Interactive  Intervention: The Colors Within Me  Activity: Patients were given blank face worksheets and were instructed to pick a color for each emotion they were feeling and show on the worksheet how much of that emotion they were feeling.  Education: LRT educated patients on other forms of self-expression.  Education Outcome: Acknowledges education/In group clarification offered  Clinical Observations/Feedback: Patient picked a color for each emotion and showed on the worksheet how much of that emotion she was feeling. Patient did not contribute to group discussion.  Leonette Monarch, LRT/CTRS 06/28/2017 3:54 PM

## 2017-06-28 NOTE — BHH Suicide Risk Assessment (Signed)
[facility] INPATIENT:  Family/Significant Other Suicide Prevention Education  Suicide Prevention Education:  Education Completed; mother, Uvaldo Rising ph#: [phone] has been identified by the patient as the family member/significant other with whom the patient will be residing, and identified as the person(s) who will aid the patient in the event of a mental health crisis (suicidal ideations/suicide attempt).  With written consent from the patient, the family member/significant other has been provided the following suicide prevention education, prior to the and/or following the discharge of the patient.  The suicide prevention education provided includes the following:  Suicide risk factors  Suicide prevention and interventions  National Suicide Hotline telephone number  [facility] assessment telephone number  [facility] Emergency Assistance [place] and/or Residential Mobile Crisis Unit telephone number  Request made of family/significant other to:  Remove weapons (e.g., guns, rifles, knives), all items previously/currently identified as safety concern.    Remove drugs/medications (over-the-counter, prescriptions, illicit drugs), all items previously/currently identified as a safety concern.  The family member/significant other verbalizes understanding of the suicide prevention education information provided.  The family member/significant other agrees to remove the items of safety concern listed above.  Emilie Rutter, MSW, LCSW-A 06/28/2017, 11:22 AM

## 2017-06-28 NOTE — Plan of Care (Signed)
Problem: Safety: Goal: Periods of time without injury will increase Outcome: Progressing Remains safe on the unit.

## 2017-06-28 NOTE — Plan of Care (Signed)
Problem: [facility] Participation in Recreation Therapeutic Interventions Goal: STG-Patient will identify at least five coping skills for ** STG: Coping Skills - Within 4 treatment sessions, patient will verbalize at least 5 coping skills for anger in each of 2 treatment sessions to increase anger management skills.  Outcome: Progressing Treatment Session 1; Completed 1 out of 2: At approximately 10:40 am, LRT met with patient in consultation room. Patient verbalized 5 coping skills for anger. Patient verbalized what triggers her to get angry, how her body responds to anger, and how she can remind herself to use her healthy coping skills. LRT provided suggestions as well.  Leonette Monarch, LRT/CTRS 08.21.18 2:02 pm Goal: STG-Other Recreation Therapy Goal (Specify) STG: Stress Management - Within 4 treatment sessions, patient will verbalize understanding of the stress management techniques in each of 2 treatment sessions to increase stress management skills.  Outcome: Progressing Treatment Session 1; Completed 1 out of 2: At approximately 10:40 am, LRT met with patient in consultation room. LRT educated and provided patient with handouts on stress management techniques. Patient verbalized understanding. LRT encouraged patient to read over and practice the stress management techniques.  Leonette Monarch, LRT/CTRS 08.21.18 2:05 pm
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
## 2017-06-28 NOTE — Progress Notes (Signed)
Patient presents with a flat affect. Patient is shaking all over.  When asked is she is cold patient states "I am withdrawing from pain killers."  Rates depression as 5/10.  Denies any pain at this time.  Encouraged to drink plenty of fluids.  Good appetite.  Stays to herself. Support and encouragement offered.  Safety maintained.
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
## 2017-06-28 NOTE — Progress Notes (Signed)
Recreation Therapy Notes  Date: 08.21.18 Time: 3:00 pm Location: Craft Room  Group Topic: Self-expression  Goal Area(s) Addresses:  Patient will be able to identify one color that represents each emotion. Patient will verbalize benefit of using art as a means of self-expression. Patient will verbalize one emotion experienced while participating in activity.  Behavioral Response: Attentive, Interactive  Intervention: The Colors Within Me  Activity: Patients were given blank face worksheets and were instructed to pick a color for each emotion they were feeling and show on the worksheet how much of that emotion they were feeling.  Education:LRT educated patients on other forms of self-expression.  Education Outcome: Acknowledges education/In group clarification offered  Clinical Observations/Feedback: Patient picked a color for each emotion and showed on the worksheet how much of that emotion she was feeling. Patient contributed to group discussion by stating what emotions she was experiencing.  Leonette Monarch, LRT/CTRS 06/28/2017 3:58 PM

## 2017-06-29 LAB — URINALYSIS, COMPLETE (UACMP) WITH MICROSCOPIC
BACTERIA UA: NONE SEEN
Bilirubin Urine: NEGATIVE
Glucose, UA: NEGATIVE mg/dL
Hgb urine dipstick: NEGATIVE
Ketones, ur: 5 mg/dL — AB
Leukocytes, UA: NEGATIVE
NITRITE: NEGATIVE
PH: 7 (ref 5.0–8.0)
Protein, ur: NEGATIVE mg/dL
SPECIFIC GRAVITY, URINE: 1.01 (ref 1.005–1.030)

## 2017-06-29 LAB — CHLAMYDIA/NGC RT PCR (ARMC ONLY)
CHLAMYDIA TR: NOT DETECTED
N GONORRHOEAE: NOT DETECTED

## 2017-06-29 LAB — RAPID HIV SCREEN (HIV 1/2 AB+AG)
HIV 1/2 ANTIBODIES: NONREACTIVE
HIV-1 P24 ANTIGEN - HIV24: NONREACTIVE

## 2017-06-29 MED ORDER — LITHIUM CARBONATE ER 300 MG PO TBCR
300.0000 mg | EXTENDED_RELEASE_TABLET | Freq: Two times a day (BID) | ORAL | Status: DC
  Administered 2017-06-29 – 2017-07-01 (×4): 300 mg via ORAL
  Filled 2017-06-29 (×4): qty 1

## 2017-06-29 MED ORDER — CARBAMAZEPINE 200 MG PO TABS
200.0000 mg | ORAL_TABLET | Freq: Two times a day (BID) | ORAL | Status: DC
  Administered 2017-06-29 – 2017-07-01 (×4): 200 mg via ORAL
  Filled 2017-06-29 (×4): qty 1

## 2017-06-29 MED ORDER — TRAZODONE HCL 50 MG PO TABS
150.0000 mg | ORAL_TABLET | Freq: Every day | ORAL | Status: DC
  Administered 2017-06-29 – 2017-06-30 (×2): 150 mg via ORAL
  Filled 2017-06-29 (×2): qty 1

## 2017-06-29 NOTE — Progress Notes (Signed)
[facility] responded to an OR for advanced Directives for pt in [id number]. [place] met pt. Pt talked about her long time struggle with drugs. She talked about her children, her current boyfriend, her job, and her desire to regain her life. Pt was emotional but calm. Pt said she had a breakdown and had a weird episode that led to a meltdown. Pt states she has goals she would like to pursue after living hospital. Pt requested prayers for strength and direction, which [place] provided with a ministry of presence.   06/29/17 1600  Clinical Encounter Type  Visited With Patient and family together  Visit Type Initial;Spiritual support  Referral From Nurse  Consult/Referral To Chaplain  Spiritual Encounters  Spiritual Needs Prayer

## 2017-06-29 NOTE — Plan of Care (Signed)
Problem: Self-Concept: Goal: Level of anxiety will decrease Outcome: Progressing Pleasant and cooperative.  Denies any anxiety

## 2017-06-29 NOTE — BHH Group Notes (Signed)
[place] LCSW Group Therapy   06/29/2017  9:30 am   Type of Therapy: Group Therapy   Participation Level: Active   Participation Quality: Attentive, Sharing and Supportive   Affect: Appropriate    Cognitive: Alert and Oriented   Insight: Developing/Improving and Engaged   Engagement in Therapy: Developing/Improving and Engaged   Modes of Intervention: Clarification, Confrontation, Discussion, Education, Exploration, Limit-setting, Orientation, Problem-solving, Rapport Building, Art therapist, Socialization and Support   Summary of Progress/Problems: The topic for group today was emotional regulation. This group focused on both positive and negative emotion identification and allowed  group members to process ways to identify feelings, regulate negative emotions, and find healthy ways to manage internal/external emotions. Group members were asked to reflect on a time when their reaction to an emotion led to a negative outcome and explored how alternative responses using emotion regulation would have benefited them. Group members were also asked to discuss a time when emotion regulation was utilized when a negative emotion was experienced. Patient defined emotion regulation and identified two emotions she experienced before being hospitalized. Patient identified ways to regulate those negative emotions. CSW provided support to patient.    Glorious Peach, MSW, LCSWA 06/29/2017, 10:08AM

## 2017-06-29 NOTE — Progress Notes (Signed)
D: Pt denies SI/HI/AVH, affect is flat and sad, but brightens upon approach. Pt is pleasant and cooperative. Pt appears less anxious and she is interacting with peers and staff appropriately, patient's thoughts are coherent and logical.  A: Pt was offered support and encouragement. Pt was given scheduled medications. Pt was encouraged to attend groups. Q 15 minute checks were done for safety.  R:Pt attends groups and interacts well with peers and staff. Pt is taking medication. Pt has no complaints.Pt receptive to treatment and safety maintained on unit.

## 2017-06-29 NOTE — Progress Notes (Signed)
Affect constricted.   Rates depression as 3/10.  Verbalizing that she has been discussing with her boyfriend plans for discharge.  Attending groups.  Medication compliant.  Maintains personal care chores.  Appetite fair.  Drinking supplements.  Support and encouragement offered. Safety maintained.

## 2017-06-29 NOTE — Progress Notes (Signed)
Recreation Therapy Notes  Date: 08.22.18 Time: 1:00 pm Location: Craft Room  Group Topic: Self-esteem  Goal Area(s) Addresses:  Patient will write at least one positive trait about self. Patient will verbalize benefit of having a healthy self-esteem.  Behavioral Response: Attentive, Interactive  Intervention: I Am  Activity: Patients were given worksheets with the letter I on them and were instructed to write as many positive traits about themselves inside the letter.  Education: LRT educated patients on ways to increase their self-esteem.  Education Outcome: Acknowledges education/In group clarification offered   Clinical Observations/Feedback: Patient wrote positive traits. Patient contributed to group discussion by stating it was easy to think of positive traits.Leonette Monarch, LRT/CTRS 06/29/2017 1:53 PM
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
## 2017-06-30 MED ORDER — CITALOPRAM HYDROBROMIDE 20 MG PO TABS: 20.0000 mg | ORAL_TABLET | Freq: Every day | ORAL | 0 refills | Status: DC

## 2017-06-30 MED ORDER — CLOTRIMAZOLE 2 % VA CREA: 1.0000 | TOPICAL_CREAM | Freq: Every day | VAGINAL | 0 refills | Status: DC

## 2017-06-30 MED ORDER — TRAZODONE HCL 150 MG PO TABS: 150.0000 mg | ORAL_TABLET | Freq: Every day | ORAL | 0 refills | Status: DC

## 2017-06-30 MED ORDER — CLOTRIMAZOLE 2 % VA CREA
1.0000 | TOPICAL_CREAM | Freq: Every day | VAGINAL | Status: DC
  Filled 2017-06-30: qty 21

## 2017-06-30 MED ORDER — MICONAZOLE NITRATE 2 % VA CREA
1.0000 | TOPICAL_CREAM | Freq: Every day | VAGINAL | Status: DC
  Filled 2017-06-30: qty 45

## 2017-06-30 MED ORDER — CARBAMAZEPINE 200 MG PO TABS: 200.0000 mg | ORAL_TABLET | Freq: Two times a day (BID) | ORAL | 0 refills | Status: DC

## 2017-06-30 MED ORDER — LITHIUM CARBONATE ER 300 MG PO TBCR: 300.0000 mg | EXTENDED_RELEASE_TABLET | Freq: Two times a day (BID) | ORAL | 0 refills | Status: DC

## 2017-06-30 NOTE — BHH Group Notes (Signed)
[place] LCSW Group Therapy Note  Date/Time: 06/30/17, 0930  Type of Therapy/Topic:  Group Therapy:  Balance in Life  Participation Level:  active  Description of Group:    This group will address the concept of balance and how it feels and looks when one is unbalanced. Patients will be encouraged to process areas in their lives that are out of balance, and identify reasons for remaining unbalanced. Facilitators will guide patients utilizing problem- solving interventions to address and correct the stressor making their life unbalanced. Understanding and applying boundaries will be explored and addressed for obtaining  and maintaining a balanced life. Patients will be encouraged to explore ways to assertively make their unbalanced needs known to significant others in their lives, using other group members and facilitator for support and feedback.  Therapeutic Goals: 1. Patient will identify two or more emotions or situations they have that consume much of in their lives. 2. Patient will identify signs/triggers that life has become out of balance:  3. Patient will identify two ways to set boundaries in order to achieve balance in their lives:  4. Patient will demonstrate ability to communicate their needs through discussion and/or role plays  Summary of Patient Progress: Pt identified physical, including substance use, and personal relationships as areas that are out of balance.  Pt shared with the group how substance use has caused her to neglect taking care of herself and how she does not have positive friends to replace drug using friends.  Pt active in group and participated in discussion.  She talked about returning to treatment to start to put positives in her life to help with sobriety.          Therapeutic Modalities:   Cognitive Behavioral Therapy Solution-Focused Therapy Assertiveness Training  Lurline Idol, [place]
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
## 2017-06-30 NOTE — Plan of Care (Signed)
Problem: [facility] Participation in Recreation Therapeutic Interventions Goal: STG-Patient will identify at least five coping skills for ** STG: Coping Skills - Within 4 treatment sessions, patient will verbalize at least 5 coping skills for anger in each of 2 treatment sessions to increase anger management skills.  Outcome: Completed/Met Date Met: 06/30/17 Treatment Session 2; Completed 2 out of 2; At approximately 2:25 pm, LRT met with patient in consultation room. Patient verbalized 5 coping skills for anger. LRT encouraged patient to use her coping skills when she felt herself getting angry to help calm herself down.  Leonette Monarch, LRT/CTRS 08.23.18 3:48 pm Goal: STG-Other Recreation Therapy Goal (Specify) STG: Stress Management - Within 4 treatment sessions, patient will verbalize understanding of the stress management techniques in each of 2 treatment sessions to increase stress management skills.  Outcome: Completed/Met Date Met: 06/30/17 Treatment Session 2; Completed 2 out of 2: At approximately 2:25 pm, LRt met with patient in consultation room. Patient reported she read over the stress management techniques. Patient verbalized understanding. LRT encouraged patient to practice the stress management techniques.  Leonette Monarch, LRT/CTRS 08.23.18 3:49 pm

## 2017-06-30 NOTE — Progress Notes (Signed)
D: Pt denies SI/HI/AVH, affect is flat and sad, but brightens upon approach. Pt is pleasant and cooperative. Pt appears less anxious, rated depression a 5/10 on a scale of ( 0- 10). Patient is interacting with peers and staff appropriately, patient's thoughts are coherent and logical.  A: Pt was offered support and encouragement. Pt was given scheduled medications. Pt was encouraged to attend groups. Q 15 minute checks were done for safety.  R:Pt attends groups and interacts well with peers and staff. Pt is taking medication. Pt has no complaints.Pt receptive to treatment and safety maintained on unit.

## 2017-06-30 NOTE — Plan of Care (Signed)
Problem: Activity: Goal: Interest or engagement in activities will improve Outcome: Progressing Attending groups.  Up to dayroom with peers.

## 2017-06-30 NOTE — Progress Notes (Signed)
Recreation Therapy Notes  Date: 08.23.18 Time: 1:00 pm Location: Craft Room  Group Topic: Leisure Education  Goal Area(s) Addresses:  Patient will identify activities for each letter of the alphabet. Patient will verbalize ability to integrate positive leisure into life post d/c. Patient will verbalize ability to use leisure as a Technical sales engineer.  Behavioral Response: Attentive, Interactive  Intervention: Leisure Alphabet  Activity: Patients were given a Leisure Air traffic controller and were instructed to write healthy leisure activities for each letter of the [place].  Education: LRT educated patients on what they need to participate in leisure.  Education Outcome: Acknowledges education/In group clarification offered   Clinical Observations/Feedback: Patient wrote healthy leisure activities. Patient contributed to group discussion by stating healthy leisure activities.  Leonette Monarch, LRT/CTRS 06/30/2017 1:55 PM

## 2017-06-30 NOTE — Progress Notes (Signed)
Rates depression as 0/10 although presents with flat affect.  Rates anxiety as 5/10.  Denies SI/HI/AVH. Medication and group compliance.  Visible in the milieu.  Interacting with peers and staff appropriately.  Support and encouragement offered.   Safety checks maintained.

## 2017-06-30 NOTE — [facility] Group Notes (Signed)
Goals Group Date/Time: 06/30/2017 9:00 AM Type of Therapy and Topic: Group Therapy: Goals Group: SMART Goals   Participation Level: Moderate  Description of Group:    The purpose of a daily goals group is to assist and guide patients in setting recovery/wellness-related goals. The objective is to set goals as they relate to the crisis in which they were admitted. Patients will be using SMART goal modalities to set measurable goals. Characteristics of realistic goals will be discussed and patients will be assisted in setting and processing how one will reach their goal. Facilitator will also assist patients in applying interventions and coping skills learned in psycho-education groups to the SMART goal and process how one will achieve defined goal.   Therapeutic Goals:   -Patients will develop and document one goal related to or their crisis in which brought them into treatment.  -Patients will be guided by LCSW using SMART goal setting modality in how to set a measurable, attainable, realistic and time sensitive goal.  -Patients will process barriers in reaching goal.  -Patients will process interventions in how to overcome and successful in reaching goal.   Patient's Goal:Pt goal was to stop negative thoughts by writing down 50 positive thoughts today and repeating them when she has negative ones.   Therapeutic Modalities:  Motivational Interviewing  Art gallery manager  SMART goals setting   Lurline Idol, [place]

## 2017-07-01 LAB — LITHIUM LEVEL: Lithium Lvl: 0.32 mmol/L — ABNORMAL LOW (ref 0.60–1.20)

## 2017-07-01 LAB — CARBAMAZEPINE LEVEL, TOTAL: Carbamazepine Lvl: 7.2 ug/mL (ref 4.0–12.0)

## 2017-07-01 LAB — HEPATITIS C ANTIBODY
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
## 2017-07-01 NOTE — Progress Notes (Signed)
  [facility] Adult Case Management Discharge Plan :  Will you be returning to the same living situation after discharge:  Yes,  returning home with boyfriend. At discharge, do you have transportation home?: Yes,  boyfriend will pick patient up. Do you have the ability to pay for your medications: Yes,  BCBS insurance.  Release of information consent forms completed and in the chart;  [name]  Patient to Follow up at: Follow-up Information    [place] on 07/06/2017.   Why:  Please follow-up with [facility] Peer Support Specialist, Autumn [name] on August 29th at [facility]. Please bring discharge paperwork to this appointment. Contact Autumn [name] if you have any questions or concerns @ [phone]. Contact information: [place] [address] [phone]           Next level of care provider has access to [place] and Suicide Prevention discussed: Yes,  SPE completed with patient's mother.  Have you used any form of tobacco in the last 30 days? (Cigarettes, Smokeless Tobacco, Cigars, and/or Pipes): Yes  Has patient been referred to the Quitline?: Patient refused referral  Patient has been referred for addiction treatment: Yes  Autumn [name], [place] 07/01/2017, 10:40 AM

## 2017-07-01 NOTE — BHH Group Notes (Signed)
[facility] LCSW Group Therapy   07/01/2017 9:30 AM   Type of Therapy: Group Therapy   Participation Level: Active   Participation Quality: Attentive, Sharing and Supportive   Affect: Appropriate   Cognitive: Alert and Oriented   Insight: Developing/Improving and Engaged   Engagement in Therapy: Developing/Improving and Engaged   Modes of Intervention: Clarification, Confrontation, Discussion, Education, Exploration, Limit-setting, Orientation, Problem-solving, Rapport Building, Art therapist, Socialization and Support   Summary of Progress/Problems: The topic for today was feelings about relapse. Pt discussed what relapse prevention is to them and identified triggers that they are on the path to relapse. Pt processed their feeling towards relapse and was able to relate to peers. Pt discussed coping skills that can be used for relapse prevention. Patient defined relapse and triggers that created relapse for patient. Patient has insight on severity of mental health relapse and how it led her to this hospitalization. She stated strategies to help reduce relapse. CSW supported patient.    Glorious Peach, MSW, LCSW-A 07/01/2017, 10:23AM

## 2017-07-01 NOTE — BHH Suicide Risk Assessment (Signed)
[facility] Discharge Suicide Risk Assessment   Principal Problem: Bipolar 1 disorder [facility]) Discharge Diagnoses:  Patient Active Problem List   Diagnosis Date Noted  . Tobacco use disorder [F17.200] 06/27/2017  . Amphetamine use disorder, severe ([place]) [F15.20] 06/27/2017  . Opioid use disorder, severe, dependence ([place]) [F11.20] 06/27/2017  . Cannabis use disorder, severe, dependence ([place]) [F12.20] 06/27/2017  . Hx of glaucoma [Z86.69] 10/02/2013  . Bipolar 1 disorder ([place]) [F31.9] 10/02/2013     Psychiatric Specialty Exam: ROS  Blood pressure 109/69, pulse 87, temperature 97.9 F (36.6 C), temperature source Oral, resp. rate 17, height 5\' 2"  (1.575 m), weight 41.3 kg (91 lb), last menstrual period 05/22/2017, SpO2 97 %.Body mass index is 16.64 kg/m.                                                       Mental Status Per Nursing Assessment::   On Admission:     Demographic Factors:  Caucasian  Loss Factors: Financial problems/change in socioeconomic status  Historical Factors: Impulsivity  Risk Reduction Factors:   Responsible for children under 21 years of age, Sense of responsibility to family and Employed  Denies having any access to guns  Continued Clinical Symptoms:  Alcohol/Substance Abuse/Dependencies More than one psychiatric diagnosis Previous Psychiatric Diagnoses and Treatments  Cognitive Features That Contribute To Risk:  Polarized thinking    Suicide Risk:  Minimal: No identifiable suicidal ideation.  Patients presenting with no risk factors but with morbid ruminations; may be classified as minimal risk based on the severity of the depressive symptoms  Follow-up Information    [place] on 07/06/2017.   Why:  Please follow-up with [facility] Peer Support Specialist, Sherrian Divers on August 29th at [facility]. Please bring discharge paperwork to this appointment. Contact [place] if you have any questions or concerns @  [phone]. Contact information: [place] [address] [phone]            Hildred Priest, MD 07/01/2017, 1:16 PM

## 2017-07-01 NOTE — Progress Notes (Signed)
Recreation Therapy Notes  Date: 08.24.18 Time: 1:00 pm Location: Craft Room  Group Topic: Coping Skills  Goal Area(s) Addresses:  Patient will verbalize importance of recognizing emotions. Patient will identify at least one emotion. Patient will identify at least one coping skill.  Behavioral Response: Attentive, Left early  Intervention: Emotion Wheel  Activity: Patients were given an Emotion Wheel worksheet and were instructed as a group to identify 8 emotions towards their recovery. Patients were then instructed to write healthy coping skills to help them cope with each emotion.  Education: LRT educated patients on healthy coping skills.  Education Outcome: Patient left before LRT educated group.  Clinical Observations/Feedback: Patient wrote some coping skills. Patient left group early stating she had a personal and physical problem. Patient did not return to group.  Leonette Monarch, LRT/CTRS 07/01/2017 1:43 PM

## 2017-07-01 NOTE — Progress Notes (Signed)
Patient was active in afternoon group. Shared that she wants to focus on the happiness and good in her life. Patient spoke mostly with another female patient sitting beside her.    07/01/17 1400  Clinical Encounter Type  Visited With Patient  Visit Type Spiritual support  Referral From Patient

## 2017-07-01 NOTE — Tx Team (Signed)
Interdisciplinary Treatment and Diagnostic Plan Update  07/01/2017 Time of Session: 11:15AM Autumn [name] MRN: [id number]  Principal Diagnosis: Bipolar 1 disorder ([place])  Secondary Diagnoses: Principal Problem:   Bipolar 1 disorder ([place]) Active Problems:   Tobacco use disorder   Amphetamine use disorder, severe (HCC)   Opioid use disorder, severe, dependence ([place])   Cannabis use disorder, severe, dependence ([place])   Current Medications:  Current Facility-Administered Medications  Medication Dose Route Frequency Provider Last Rate Last Dose  . acetaminophen (TYLENOL) tablet 1,000 mg  1,000 mg Oral Q6H PRN Hildred Priest, MD      . alum & mag hydroxide-simeth (MAALOX/MYLANTA) 200-200-20 MG/5ML suspension 30 mL  30 mL Oral Q4H PRN Clapacs, [name], MD      . carbamazepine (TEGRETOL) tablet 200 mg  200 mg Oral BID Hildred Priest, MD   200 mg at 07/01/17 0902  . citalopram (CELEXA) tablet 20 mg  20 mg Oral Daily Hildred Priest, MD   20 mg at 07/01/17 0902  . feeding supplement (ENSURE ENLIVE) (ENSURE ENLIVE) liquid 237 mL  237 mL Oral TID BM Hildred Priest, MD   237 mL at 06/30/17 1400  . hydrOXYzine (ATARAX/VISTARIL) tablet 25 mg  25 mg Oral TID PRN Clapacs, [name], MD   25 mg at 06/30/17 2224  . lithium carbonate (LITHOBID) CR tablet 300 mg  300 mg Oral Q12H Hildred Priest, MD   300 mg at 06/30/17 2224  . loperamide (IMODIUM) capsule 4 mg  4 mg Oral PRN Hildred Priest, MD      . magnesium hydroxide (MILK OF MAGNESIA) suspension 30 mL  30 mL Oral Daily PRN Clapacs, [name], MD      . miconazole (MONISTAT 7) 2 % vaginal cream 1 Applicatorful  1 Applicatorful Vaginal QHS Hernandez-Gonzalez, Seth Bake, MD      . nicotine (NICODERM CQ - dosed in mg/24 hours) patch 21 mg  21 mg Transdermal Daily Hildred Priest, MD   21 mg at 07/01/17 0902  . ondansetron (ZOFRAN-ODT) disintegrating tablet 4 mg  4 mg Oral Q8H PRN  Hildred Priest, MD      . traZODone (DESYREL) tablet 150 mg  150 mg Oral QHS Hildred Priest, MD   150 mg at 06/30/17 2224   PTA Medications: Prescriptions Prior to Admission  Medication Sig Dispense Refill Last Dose  . buPROPion (WELLBUTRIN XL) 300 MG 24 hr tablet Take 300 mg by mouth daily.   Not Taking at Unknown time  . naproxen (NAPROSYN) 500 MG tablet Take 1 tablet (500 mg total) by mouth 2 (two) times daily. (Patient not taking: Reported on 06/24/2017) 30 tablet 0 Not Taking at Unknown time  . tiZANidine (ZANAFLEX) 4 MG tablet Take 1 tablet (4 mg total) by mouth every 6 (six) hours as needed for muscle spasms. (Patient not taking: Reported on 06/24/2017) 30 tablet 0 Not Taking at Unknown time  . venlafaxine (EFFEXOR) 75 MG tablet Take 75 mg by mouth 2 (two) times daily.   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   Physician Treatment Plan for Primary Diagnosis: Bipolar 1 disorder ([place]) Long Term Goal(s): Improvement in symptoms so as ready for discharge Improvement in symptoms so as ready for discharge   Short Term Goals: Ability  to identify changes in lifestyle to reduce recurrence of condition will improve Ability to demonstrate self-control will improve Compliance with prescribed medications will improve Ability to identify changes in lifestyle to reduce recurrence of condition will improve Ability to identify and develop effective coping behaviors will improve  Medication Management: Evaluate patient's response, side effects, and tolerance of medication regimen.  Therapeutic Interventions: 1 to 1 sessions, Unit Group  sessions and Medication administration.  Evaluation of Outcomes: Adequate for discharge  Physician Treatment Plan for Secondary Diagnosis: Principal Problem:   Bipolar 1 disorder ([place]) Active Problems:   Tobacco use disorder   Amphetamine use disorder, severe (HCC)   Opioid use disorder, severe, dependence ([place])   Cannabis use disorder, severe, dependence ([place])  Long Term Goal(s): Improvement in symptoms so as ready for discharge Improvement in symptoms so as ready for discharge   Short Term Goals: Ability to identify changes in lifestyle to reduce recurrence of condition will improve Ability to demonstrate self-control will improve Compliance with prescribed medications will improve Ability to identify changes in lifestyle to reduce recurrence of condition will improve Ability to identify and develop effective coping behaviors will improve     Medication Management: Evaluate patient's response, side effects, and tolerance of medication regimen.  Therapeutic Interventions: 1 to 1 sessions, Unit Group sessions and Medication administration.  Evaluation of Outcomes: Adequate for discharge   RN Treatment Plan for Primary Diagnosis: Bipolar 1 disorder ([place]) Long Term Goal(s): Knowledge of disease and therapeutic regimen to maintain health will improve  Short Term Goals: Ability to demonstrate self-control, Ability to disclose and discuss suicidal ideas and Compliance with prescribed medications will improve  Medication Management: RN will administer medications as ordered by provider, will assess and evaluate patient's response and provide education to patient for prescribed medication. RN will report any adverse and/or side effects to prescribing provider.  Therapeutic Interventions: 1 on 1 counseling sessions, Psychoeducation, Medication administration, Evaluate responses to treatment, Monitor vital signs and CBGs as ordered, Perform/monitor CIWA, COWS, AIMS and Fall Risk screenings  as ordered, Perform wound care treatments as ordered.  Evaluation of Outcomes: Adequate for discharge   LCSW Treatment Plan for Primary Diagnosis: Bipolar 1 disorder ([place]) Long Term Goal(s): Safe transition to appropriate next level of care at discharge, Engage patient in therapeutic group addressing interpersonal concerns.  Short Term Goals: Engage patient in aftercare planning with referrals and resources, Increase social support, Increase emotional regulation and Identify triggers associated with mental health/substance abuse issues  Therapeutic Interventions: Assess for all discharge needs, 1 to 1 time with Social worker, Explore available resources and support systems, Assess for adequacy in community support network, Educate family and significant other(s) on suicide prevention, Complete Psychosocial Assessment, Interpersonal group therapy.  Evaluation of Outcomes: Adequate for discharge   Recreational Therapy Treatment Plan for Primary Diagnosis: Bipolar 1 disorder ([place]) Long Term Goal(s): Patient will participate in recreation therapy treatment in at least 2 group sessions without prompting from [place]: Increase anger management skills, Increase stress management skills  Treatment Modalities: Group Therapy and Individual Treatment Sessions  Therapeutic Interventions: Psychoeducation  Evaluation of Outcomes: Adequate for discharge  Progress in Treatment: Attending groups: Yes. Participating in groups: Yes. Taking medication as prescribed: Yes. Toleration medication: Yes. Family/Significant other contact made: Yes, CSW spoke with pt's mother. Patient understands diagnosis: Yes. Discussing patient identified problems/goals with staff: Yes. Medical problems stabilized or resolved: Yes. Denies suicidal/homicidal ideation: Yes. Issues/concerns per patient self-inventory: No.  New problem(s) identified: No,  Describe:  None identified.  New Short Term/Long Term  Goal(s): Patient stated her goal is to get stabilize on medications.  Discharge Plan or Barriers: Patient will discharge home with boyfriend and follow-up with [place].  Reason for Continuation of Hospitalization: Aggression Depression Hallucinations Medication stabilization  Estimated Length of Stay: D/C 07/01/2017  Attendees: Patient: Autumn [name] 07/01/2017 10:45 AM  Physician: Dr. Merlyn Albert, MD  07/01/2017 10:45 AM  Nursing:  07/01/2017 10:45 AM  RN Care Manager: 07/01/2017 10:45 AM  Social Worker: Glorious Peach, MSW, LCSW-A 07/01/2017 10:45 AM  Recreational Therapist: Drue Flirt, LRT/CTRS  07/01/2017 10:45 AM  Other:  07/01/2017 10:45 AM  Other:  07/01/2017 10:45 AM  Other: 07/01/2017 10:45 AM    Scribe for Treatment Team: Emilie Rutter, [place] 07/01/2017 10:45 AM

## 2017-07-01 NOTE — Progress Notes (Signed)
Affect flat.  Denies SI/HI/AVH. Discharge instructions given, verbalized understanding.  Prescriptions given and personal belongings returned.  Escorted off unit by this Probation officer to doctors on call parking lot to meet courtesy car to be transported to bus stop.

## 2017-07-01 NOTE — Progress Notes (Signed)
D:Pt denies SI/HI/AVH, affect is flat but brightens upon approach. Pt is pleasant and cooperative, she appears less anxious, rated depression a 1/10 on a scale of ( 0- 10). Patient is interacting with peers and staff appropriately, patient's thoughts are coherent and logical.  A:Pt was offered support and encouragement. Pt was given scheduled medications. Pt was encouraged to attend groups. Q 15 minute checks were done for safety.  R:Pt attends groups and interacts well with peers and staff. Pt is taking medication. Pt has no complaints.Pt receptive to treatment and safety maintained on unit.

## 2017-07-04 ENCOUNTER — Emergency Department
Admission: EM | Admit: 2017-07-04 | Discharge: 2017-07-04 | Discharge status: Against Medical Advice | Payer: BLUE CROSS/BLUE SHIELD | Source: Home / Self Care

## 2017-07-04 ENCOUNTER — Emergency Department
Admission: EM | Admit: 2017-07-04 | Discharge: 2017-07-05 | Disposition: A | Discharge status: Other Acute Inpatient Hospital | Payer: BLUE CROSS/BLUE SHIELD | Attending: Emergency Medicine | Admitting: Emergency Medicine

## 2017-07-04 ENCOUNTER — Encounter: Payer: Self-pay | Admitting: Medical Oncology

## 2017-07-04 DIAGNOSIS — F17200 Nicotine dependence, unspecified, uncomplicated: Secondary | ICD-10-CM | POA: Diagnosis not present

## 2017-07-04 DIAGNOSIS — F319 Bipolar disorder, unspecified: Secondary | ICD-10-CM | POA: Diagnosis not present

## 2017-07-04 DIAGNOSIS — R4689 Other symptoms and signs involving appearance and behavior: Secondary | ICD-10-CM | POA: Diagnosis present

## 2017-07-04 DIAGNOSIS — F3110 Bipolar disorder, current episode manic without psychotic features, unspecified: Secondary | ICD-10-CM | POA: Diagnosis not present

## 2017-07-04 LAB — COMPREHENSIVE METABOLIC PANEL
ALBUMIN: 4.7 g/dL (ref 3.5–5.0)
ALT: 22 U/L (ref 14–54)
AST: 16 U/L (ref 15–41)
Alkaline Phosphatase: 55 U/L (ref 38–126)
Anion gap: 11 (ref 5–15)
BUN: 9 mg/dL (ref 6–20)
CHLORIDE: 105 mmol/L (ref 101–111)
CO2: 22 mmol/L (ref 22–32)
CREATININE: 0.66 mg/dL (ref 0.44–1.00)
Calcium: 9.4 mg/dL (ref 8.9–10.3)
GFR calc Af Amer: >60 mL/min (ref >60)
GLUCOSE: 123 mg/dL — AB (ref 65–99)
Potassium: 3.3 mmol/L — ABNORMAL LOW (ref 3.5–5.1)
SODIUM: 138 mmol/L (ref 135–145)
Total Bilirubin: 0.8 mg/dL (ref 0.3–1.2)
Total Protein: 7 g/dL (ref 6.5–8.1)

## 2017-07-04 LAB — SALICYLATE LEVEL: Salicylate Lvl: <7 mg/dL (ref 2.8–30.0)

## 2017-07-04 LAB — URINE DRUG SCREEN, QUALITATIVE (ARMC ONLY)
AMPHETAMINES, UR SCREEN: NOT DETECTED
Barbiturates, Ur Screen: NOT DETECTED
Benzodiazepine, Ur Scrn: NOT DETECTED
COCAINE METABOLITE, UR ~~LOC~~: NOT DETECTED
Cannabinoid 50 Ng, Ur ~~LOC~~: NOT DETECTED
MDMA (ECSTASY) UR SCREEN: NOT DETECTED
Methadone Scn, Ur: NOT DETECTED
OPIATE, UR SCREEN: NOT DETECTED
PHENCYCLIDINE (PCP) UR S: NOT DETECTED
Tricyclic, Ur Screen: NOT DETECTED

## 2017-07-04 LAB — CBC
HCT: 39.8 % (ref 35.0–47.0)
Hemoglobin: 13.7 g/dL (ref 12.0–16.0)
MCH: 30.7 pg (ref 26.0–34.0)
MCHC: 34.4 g/dL (ref 32.0–36.0)
MCV: 89.2 fL (ref 80.0–100.0)
Platelets: 279 10*3/uL (ref 150–440)
RBC: 4.47 MIL/uL (ref 3.80–5.20)
RDW: 12.8 % (ref 11.5–14.5)
WBC: 11 10*3/uL (ref 3.6–11.0)

## 2017-07-04 LAB — HCG, QUANTITATIVE, PREGNANCY: hCG, Beta Chain, Quant, S: 1 m[IU]/mL (ref <5)

## 2017-07-04 LAB — ETHANOL: Alcohol, Ethyl (B): <5 mg/dL (ref <5)

## 2017-07-04 LAB — ACETAMINOPHEN LEVEL: Acetaminophen (Tylenol), Serum: 15 ug/mL (ref 10–30)

## 2017-07-04 MED ORDER — LORAZEPAM 2 MG/ML IJ SOLN
2.0000 mg | Freq: Once | INTRAMUSCULAR | Status: AC
  Administered 2017-07-04: 2 mg via INTRAVENOUS

## 2017-07-04 MED ORDER — CARBAMAZEPINE 200 MG PO TABS
200.0000 mg | ORAL_TABLET | Freq: Two times a day (BID) | ORAL | Status: DC
Start: 2017-07-04 — End: 2017-07-05
  Administered 2017-07-04: 200 mg via ORAL
  Filled 2017-07-04: qty 1

## 2017-07-04 MED ORDER — DIPHENHYDRAMINE HCL 50 MG/ML IJ SOLN
50.0000 mg | Freq: Once | INTRAMUSCULAR | Status: AC
  Administered 2017-07-04: 50 mg via INTRAVENOUS

## 2017-07-04 MED ORDER — DIPHENHYDRAMINE HCL 50 MG/ML IJ SOLN
INTRAMUSCULAR | Status: AC
  Administered 2017-07-04: 50 mg via INTRAVENOUS
  Filled 2017-07-04: qty 1

## 2017-07-04 MED ORDER — QUETIAPINE FUMARATE 25 MG PO TABS
100.0000 mg | ORAL_TABLET | Freq: Every day | ORAL | Status: DC
  Administered 2017-07-04: 100 mg via ORAL
  Filled 2017-07-04: qty 4

## 2017-07-04 MED ORDER — LITHIUM CARBONATE ER 450 MG PO TBCR
450.0000 mg | EXTENDED_RELEASE_TABLET | Freq: Two times a day (BID) | ORAL | Status: DC
  Administered 2017-07-04: 450 mg via ORAL
  Filled 2017-07-04 (×2): qty 1

## 2017-07-04 MED ORDER — LORAZEPAM 2 MG/ML IJ SOLN
INTRAMUSCULAR | Status: AC
  Administered 2017-07-04: 2 mg via INTRAVENOUS
  Filled 2017-07-04: qty 1

## 2017-07-04 NOTE — ED Notes (Signed)
Pt's significant other - [name] - given pass code: [id number] (pt states SO can have password and can call for updates). SO also given visiting hours paper.

## 2017-07-04 NOTE — ED Notes (Signed)
Pt dressed out prior to arriving in rm, pt's belongings were placed in bag and labeled prior to arriving in rm 25 and was taken to behavioral lockers by EDT.
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
## 2017-07-04 NOTE — ED Triage Notes (Signed)
Pt is here via [employer] under IVC with reports that pt was exhibiting erratic behavior in the [employer] parking lot, pt was knocking on peoples windows. Pt acting out in triage, not cooperative, attempting to sit in the floor.

## 2017-07-04 NOTE — ED Notes (Signed)
Sitter hourly rounding. Pt sleeping and calm. Side rails up. Environment safe.AS

## 2017-07-04 NOTE — ED Notes (Signed)
Pt awake, tried to get out of the bed a couple of times. Explained to patient she had to remain in the bed and could not walk around. Pt indicated she had to go to [employer] to pick up perfume. Pt cooperated and laid back in bed.AS

## 2017-07-04 NOTE — ED Notes (Signed)
Pt sleeping. Environment safe. AS

## 2017-07-04 NOTE — ED Notes (Signed)
Pt given sandwich tray and water 

## 2017-07-04 NOTE — ED Notes (Signed)
Pt awake, tried to get out of bed two times. Pt cooperated and laid back once prompted.AS

## 2017-07-04 NOTE — ED Notes (Signed)
Pt  ivc  Pending  Consult

## 2017-07-04 NOTE — ED Notes (Signed)
Dr. Weber Cooks back in rm to talk with pt.

## 2017-07-04 NOTE — ED Notes (Addendum)
This RN talked with pt's significant other after pt stated it was fine to talk with boyfriend Autumn [name] [phone]) .  Per Autumn [name] the SO, pt was seen here last week and in the behavorial med and discharged Friday. SO states since discharge pt has not slept and only paced in the hallways during the weekend. SO states pt did wander off Saturday and was returned by [employer] early Sunday morning when [employer] [facility] was called. SO states "Elmo Putt" from [employer] came out to see pt and stated pt needed to be taken to hospital for evaluation. SO brought pt here early this morning however pt did not want to stay and ended up leaving due to not having papers in place from magistrate. Papers are currently in place for pt to be IVC.  Autumn [name] also states "she has been saying she has dreams that her mother is dead and has to go to her mother's house." Autumn [name] states her mother is alive. Autumn [name] states pt has also stated "she talked to my dad and friend Sam who have been dead for years."  Pt states she is not sure how she got to [employer] today and states "I was worried about my family and friends and I needed to talk to [place]." When asked if she talked with family and friends pt states "no" and when asked for families name pt states "I don't know their name." Pt denies SI/HI. Per Autumn [name], pt has been working at [employer] with him and has been taking her medication for bipolar d/o.

## 2017-07-04 NOTE — ED Notes (Addendum)
Dr. Weber Cooks in rm however pt sleeping at this time so unable to talk with pt at this time.

## 2017-07-04 NOTE — Consult Note (Signed)
[facility] Consult   Reason for Consult:  Consult for 41 year old woman brought in by police after being found acting in a bizarre agitated manner in public Referring Physician:  Eual Fines Patient Identification: Lavone Barrientes MRN:  [id number] Principal Diagnosis: Bipolar 1 disorder [facility]) Diagnosis:   Patient Active Problem List   Diagnosis Date Noted  . Tobacco use disorder [F17.200] 06/27/2017  . 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 Opioid use disorder, severe, dependence ([place]) [F11.20] 06/27/2017  . Cannabis use disorder, severe, dependence ([place]) [F12.20] 06/27/2017  . Hx of glaucoma [Z86.69] 10/02/2013  . Bipolar 1 disorder ([place]) [F31.9] 10/02/2013    Total Time spent with patient: 1 hour  Subjective:   Manpreet Kemmer is a 41 y.o. female patient admitted with "I didn't take care of myself".  HPI:  Patient interview chart reviewed. Patient had received some sedating medicine earlier and so her history is limited. She was brought in by police after being found agitated in public. Apparently she was at the [facility] parking lot and was pounding on people's windows. In the emergency room she was described as jumping up and down on the bed and being intermittently agitated. Patient tells me that after her discharge on the 24th he has not slept for 3 days. He claims that she has been compliant with her medicine and denies that she's been using any kind of drugs. She denies any specific stressor. She said she went to stay at "somebody's house". Can't give me much more detail about what is been going on.  Social history: Patient is on clear in talking with me about where she is currently living or what her situation is. She does claim she's been compliant with medicine.  Substance abuse history: She was diagnosed with amphetamine abuse because of Adderall last time she was in the hospital and apparently has a history of misuse of opiates but the  patient is denying any substance abuse in the last few days and her drug screen is currently negative  Medical history: No specific known medical problem outside of the mental health  Past Psychiatric History: Patient was just discharged from the unit 3 days ago with a diagnosis of bipolar disorder and was on lithium and Tegretol at that time. She does have a past history of suicide attempts. Apparently very as antipsychotics have been tried in the past with limited tolerability. No record that I see of Seroquel being used in the past. Tegretol level was low normal and lithium level was subtherapeutic at recent discharge.  Risk to Self: Is patient at risk for suicide?: No Risk to Others:   Prior Inpatient Therapy:   Prior Outpatient Therapy:    Past Medical History:  Past Medical History:  Diagnosis Date  . Allergy    Seasonal, Ultram (seizures)  . Anemia 2005  . Bipolar 1 disorder ([place])   . Glaucoma   . Seizures ([place]) 2008    Past Surgical History:  Procedure Laterality Date  . BREAST BIOPSY Right    x 2  . CESAREAN SECTION     x 2   Family History:  Family History  Problem Relation Age of Onset  . Hypertension Mother   . Hyperlipidemia Mother   . Parkinson's disease Father    Family Psychiatric  History: No known family history Social History:  History  Alcohol Use No    Comment: Ocassional     History  Drug Use    Comment: percocet -last  use over 1 week ago    Social History   Social History  . Marital status: Single    Spouse name: N/A  . Number of children: N/A  . Years of education: N/A   Social History Main Topics  . Smoking status: Current Every Day Smoker    Packs/day: 1.00    Years: 9.00  . Smokeless tobacco: Never Used     Comment: Patient not interested  . Alcohol use No     Comment: Ocassional  . Drug use: Yes     Comment: percocet -last use over 1 week ago  . Sexual activity: No   Other Topics Concern  . None   Social History Narrative   . None   Additional Social History:    Allergies:   Allergies  Allergen Reactions  . Haldol [Haloperidol Lactate]     Patient states that she gets stiff muscles when taking Haldol  . Ultram [Tramadol] Other (See Comments)    Seizures    Labs:  Results for orders placed or performed during the hospital encounter of 07/04/17 (from the past 48 hour(s))  Comprehensive metabolic panel     Status: Abnormal   Collection Time: 07/04/17  3:44 PM  Result Value Ref Range   Sodium 138 135 - 145 mmol/L   Potassium 3.3 (L) 3.5 - 5.1 mmol/L   Chloride 105 101 - 111 mmol/L   CO2 22 22 - 32 mmol/L   Glucose, Bld 123 (H) 65 - 99 mg/dL   BUN 9 6 - 20 mg/dL   Creatinine, Ser 0.66 0.44 - 1.00 mg/dL   Calcium 9.4 8.9 - 10.3 mg/dL   Total Protein 7.0 6.5 - 8.1 g/dL   Albumin 4.7 3.5 - 5.0 g/dL   AST 16 15 - 41 U/L   ALT 22 14 - 54 U/L   Alkaline Phosphatase 55 38 - 126 U/L   Total Bilirubin 0.8 0.3 - 1.2 mg/dL   GFR calc non Af Amer >60 >60 mL/min   GFR calc Af Amer >60 >60 mL/min    Comment: (NOTE) The eGFR has been calculated using the CKD EPI equation. This calculation has not been validated in all clinical situations. eGFR's persistently <60 mL/min signify possible Chronic Kidney Disease.    Anion gap 11 5 - 15  Ethanol     Status: None   Collection Time: 07/04/17  3:44 PM  Result Value Ref Range   Alcohol, Ethyl (B) <5 <5 mg/dL    Comment:        LOWEST DETECTABLE LIMIT FOR SERUM ALCOHOL IS 5 mg/dL FOR MEDICAL PURPOSES ONLY   cbc     Status: None   Collection Time: 07/04/17  3:44 PM  Result Value Ref Range   WBC 11.0 3.6 - 11.0 K/uL   RBC 4.47 3.80 - 5.20 MIL/uL   Hemoglobin 13.7 12.0 - 16.0 g/dL   HCT 39.8 35.0 - 47.0 %   MCV 89.2 80.0 - 100.0 fL   MCH 30.7 26.0 - 34.0 pg   MCHC 34.4 32.0 - 36.0 g/dL   RDW 12.8 11.5 - 14.5 %   Platelets 279 150 - 440 K/uL  Urine Drug Screen, Qualitative     Status: None   Collection Time: 07/04/17  3:44 PM  Result Value Ref Range    Tricyclic, Ur Screen NONE DETECTED NONE DETECTED   Amphetamines, Ur Screen NONE DETECTED NONE DETECTED   MDMA (Ecstasy)Ur Screen NONE DETECTED NONE DETECTED   Cocaine Metabolite,Ur [place]  NONE DETECTED NONE DETECTED   Opiate, Ur Screen NONE DETECTED NONE DETECTED   Phencyclidine (PCP) Ur S NONE DETECTED NONE DETECTED   Cannabinoid 50 Ng, Ur [place] NONE DETECTED NONE DETECTED   Barbiturates, Ur Screen NONE DETECTED NONE DETECTED   Benzodiazepine, Ur Scrn NONE DETECTED NONE DETECTED   Methadone Scn, Ur NONE DETECTED NONE DETECTED    Comment: (NOTE) 732  Tricyclics, urine               Cutoff 1000 ng/mL 200  Amphetamines, urine             Cutoff 1000 ng/mL 300  MDMA (Ecstasy), urine           Cutoff 500 ng/mL 400  Cocaine Metabolite, urine       Cutoff 300 ng/mL 500  Opiate, urine                   Cutoff 300 ng/mL 600  Phencyclidine (PCP), urine      Cutoff 25 ng/mL 700  Cannabinoid, urine              Cutoff 50 ng/mL 800  Barbiturates, urine             Cutoff 200 ng/mL 900  Benzodiazepine, urine           Cutoff 200 ng/mL 1000 Methadone, urine                Cutoff 300 ng/mL 1100 1200 The urine drug screen provides only a preliminary, unconfirmed 1300 analytical test result and should not be used for non-medical 1400 purposes. Clinical consideration and professional judgment should 1500 be applied to any positive drug screen result due to possible 1600 interfering substances. A more specific alternate chemical method 1700 must be used in order to obtain a confirmed analytical result.  1800 Gas chromato graphy / mass spectrometry (GC/MS) is the preferred 1900 confirmatory method.   hCG, quantitative, pregnancy     Status: None   Collection Time: 07/04/17  3:44 PM  Result Value Ref Range   hCG, Beta Chain, Quant, S 1 <5 mIU/mL    Comment:          GEST. AGE      CONC.  (mIU/mL)   <=1 WEEK        5 - 50     2 WEEKS       50 - 500     3 WEEKS       100 - 10,000     4 WEEKS     1,000 -  30,000     5 WEEKS     3,500 - 115,000   6-8 WEEKS     12,000 - 270,000    12 WEEKS     15,000 - 220,000        FEMALE AND NON-PREGNANT FEMALE:     LESS THAN 5 mIU/mL   Salicylate level     Status: None   Collection Time: 07/04/17  3:44 PM  Result Value Ref Range   Salicylate Lvl <2.0 2.8 - 30.0 mg/dL  Acetaminophen level     Status: None   Collection Time: 07/04/17  3:44 PM  Result Value Ref Range   Acetaminophen (Tylenol), Serum 15 10 - 30 ug/mL    Comment:        THERAPEUTIC CONCENTRATIONS VARY SIGNIFICANTLY. A RANGE OF 10-30 ug/mL MAY BE AN EFFECTIVE CONCENTRATION FOR MANY PATIENTS. HOWEVER, SOME ARE BEST TREATED AT CONCENTRATIONS  OUTSIDE THIS RANGE. ACETAMINOPHEN CONCENTRATIONS >150 ug/mL AT 4 HOURS AFTER INGESTION AND >50 ug/mL AT 12 HOURS AFTER INGESTION ARE OFTEN ASSOCIATED WITH TOXIC REACTIONS.     Current Facility-Administered Medications  Medication Dose Route Frequency Provider Last Rate Last Dose  . carbamazepine (TEGRETOL) tablet 200 mg  200 mg Oral BID PC Clapacs, [name], MD      . lithium carbonate (ESKALITH) CR tablet 450 mg  450 mg Oral Q12H Clapacs, [name], MD      . QUEtiapine (SEROQUEL) tablet 100 mg  100 mg Oral QHS Clapacs, [name], MD       Current Outpatient Prescriptions  Medication Sig Dispense Refill  . carbamazepine (TEGRETOL) 200 MG tablet Take 1 tablet (200 mg total) by mouth 2 (two) times daily. 60 tablet 0  . citalopram (CELEXA) 20 MG tablet Take 1 tablet (20 mg total) by mouth daily. 30 tablet 0  . lithium carbonate (LITHOBID) 300 MG CR tablet Take 1 tablet (300 mg total) by mouth every 12 (twelve) hours. 60 tablet 0  . traZODone (DESYREL) 150 MG tablet Take 1 tablet (150 mg total) by mouth at bedtime. 30 tablet 0  . clotrimazole (GYNE-LOTRIMIN 3) 2 % vaginal cream Place 1 Applicatorful vaginally at bedtime. 21 g 0    Musculoskeletal: Strength & Muscle Tone: within normal limits Gait & Station: normal Patient leans: N/A  Psychiatric  Specialty Exam: Physical Exam  Nursing note and vitals reviewed. Constitutional: She appears well-developed and well-nourished.  HENT:  Head: Normocephalic and atraumatic.  Eyes: Pupils are equal, round, and reactive to light. Conjunctivae are normal.  Neck: Normal range of motion.  Cardiovascular: Regular rhythm and normal heart sounds.   Respiratory: Effort normal. No respiratory distress.  GI: Soft.  Musculoskeletal: Normal range of motion.  Neurological: She is alert.  Skin: Skin is warm and dry.  Psychiatric: Her affect is blunt. Her speech is delayed. She is slowed. Cognition and memory are impaired. She expresses impulsivity. She expresses no homicidal and no suicidal ideation. She exhibits abnormal recent memory.    Review of Systems  Constitutional: Negative.   HENT: Negative.   Eyes: Negative.   Respiratory: Negative.   Cardiovascular: Negative.   Gastrointestinal: Negative.   Musculoskeletal: Negative.   Skin: Negative.   Neurological: Negative.   Psychiatric/Behavioral: Positive for hallucinations and memory loss. Negative for depression, substance abuse and suicidal ideas. The patient is nervous/anxious and has insomnia.     Blood pressure 135/86, pulse 93, temperature 98.2 F (36.8 C), temperature source Oral, resp. rate 18, height '5\' 1"'  (1.549 m), weight 40.8 kg (90 lb), SpO2 98 %.Body mass index is 17.01 kg/m.  General Appearance: Casual  Eye Contact:  Minimal  Speech:  Slow  Volume:  Decreased  Mood:  Euthymic  Affect:  Constricted  Thought Process:  Disorganized  Orientation:  Negative  Thought Content:  Illogical and Tangential  Suicidal Thoughts:  No  Homicidal Thoughts:  No  Memory:  Immediate;   Fair Recent;   Poor Remote;   Fair  Judgement:  Impaired  Insight:  Shallow  Psychomotor Activity:  Decreased  Concentration:  Concentration: Poor  Recall:  [place] of Knowledge:  Good  Language:  Good  Akathisia:  No  Handed:  Right  AIMS (if  indicated):     Assets:  Desire for Improvement Physical Health  ADL's:  Intact  Cognition:  Impaired,  Mild  Sleep:        Treatment Plan Summary:  Daily contact with patient to assess and evaluate symptoms and progress in treatment, Medication management and Plan This is a 41 year old woman with bipolar disorder who was brought into the hospital once again displaying behavior typical of mania. She is now been given some sedating medicine and is tired. On interview she is disorganized in her speech. She is able to answer direct questions briefly but then begins rambling and using bizarre words. Patient has no evidence of drug abuse. Still psychotic. Patient will be readmitted to the psychiatric unit. Continue lithium at slightly higher dose of 450 mg twice a day and continue her Tegretol. Replace trazodone was Seroquel at night for sleep. Total treatment team can decide on further planning.  Disposition: Recommend psychiatric Inpatient admission when medically cleared.  Alethia Berthold, MD 07/04/2017 6:57 PM

## 2017-07-04 NOTE — BH Assessment (Signed)
Patient is to be admitted to [facility] by Dr. Weber Cooks.  Attending Physician will be Dr. Bary Leriche.   Patient has been assigned to room 320, by [place].   Intake Paper Work has been signed and placed on patient chart.  ER staff is aware of the admission [facility]  [facility]).

## 2017-07-04 NOTE — ED Provider Notes (Signed)
[facility] Emergency Department Provider Note  ____________________________________________  Time seen: Approximately 4:20 PM  I have reviewed the triage vital signs and the nursing notes.   HISTORY  Chief Complaint Psychiatric Evaluation  Level 5 caveat:  Portions of the history and physical were unable to be obtained due to AMS   HPI Autumn [name] is a 41 y.o. female ith a history of bipolar disorder and polysubstance abuse who presents for evaluation of erratic behavior under IVC by [facility] PD.patient was discharged from the hospital 3 days ago when she was admitted for similar complaint. Today she was found trying to break into cars and running in the parking lot of [place]. Patient would go from episodes where she can normally to run and extremely confused. She denies drug use. She denies drugs. She is alert and oriented x 3 and has a clam and normal conversation for a minute and then starts climbing on the bed and trying to throw herself off the bed. She reports that she is feeling manic and has not slept or eaten anything in 3 days.   Past Medical History:  Diagnosis Date  . Allergy    Seasonal, Ultram (seizures)  . Anemia 2005  . Bipolar 1 disorder ([place])   . Glaucoma   . Seizures ([place]) 2008    Patient Active Problem List   Diagnosis Date Noted  . Tobacco use disorder 06/27/2017  . Amphetamine use disorder, severe ([place]) 06/27/2017  . Opioid use disorder, severe, dependence ([place]) 06/27/2017  . Cannabis use disorder, severe, dependence ([place]) 06/27/2017  . Hx of glaucoma 10/02/2013  . Bipolar 1 disorder ([place]) 10/02/2013    Past Surgical History:  Procedure Laterality Date  . BREAST BIOPSY Right    x 2  . CESAREAN SECTION     x 2    Prior to Admission medications   Medication Sig Start Date End Date Taking? Authorizing Provider  carbamazepine (TEGRETOL) 200 MG tablet Take 1 tablet (200 mg total) by mouth 2 (two) times daily.  06/30/17  Yes Hildred Priest, MD  citalopram (CELEXA) 20 MG tablet Take 1 tablet (20 mg total) by mouth daily. 07/01/17  Yes Hildred Priest, MD  lithium carbonate (LITHOBID) 300 MG CR tablet Take 1 tablet (300 mg total) by mouth every 12 (twelve) hours. 06/30/17  Yes Hildred Priest, MD  traZODone (DESYREL) 150 MG tablet Take 1 tablet (150 mg total) by mouth at bedtime. 06/30/17  Yes Hildred Priest, MD  clotrimazole (GYNE-LOTRIMIN 3) 2 % vaginal cream Place 1 Applicatorful vaginally at bedtime. 06/30/17   Hildred Priest, MD    Allergies Haldol [haloperidol lactate] and Ultram [tramadol]  Family History  Problem Relation Age of Onset  . Hypertension Mother   . Hyperlipidemia Mother   . Parkinson's disease Father     Social History Social History  Substance Use Topics  . Smoking status: Current Every Day Smoker    Packs/day: 1.00    Years: 9.00  . Smokeless tobacco: Never Used     Comment: Patient not interested  . Alcohol use No     Comment: Ocassional    Review of Systems  Constitutional: Negative for fever. Eyes: Negative for visual changes. ENT: Negative for sore throat. Neck: No neck pain  Cardiovascular: Negative for chest pain. Respiratory: Negative for shortness of breath. Gastrointestinal: Negative for abdominal pain, vomiting or diarrhea. Genitourinary: Negative for dysuria. Musculoskeletal: Negative for back pain. Skin: Negative for rash. Neurological: Negative for headaches, weakness or numbness.  Psych: + manic, erratic behavior ____________________________________________   PHYSICAL EXAM:  VITAL SIGNS: ED Triage Vitals [07/04/17 1543]  Enc Vitals Group     BP 135/86     Pulse Rate 93     Resp 18     Temp 98.2 F (36.8 C)     Temp Source Oral     SpO2 98 %     Weight 90 lb (40.8 kg)     Height 5\' 1"  (1.549 m)     Head Circumference      Peak Flow      Pain Score      Pain Loc      Pain Edu?       Excl. in [place]?     Constitutional: Alert and oriented x3 for a few minutes will have a normal conversation and patient will start acting erratic trying to jump off the bed and her throw herself on the ground. She looks disheveled HEENT:      Head: Normocephalic and atraumatic.         Eyes: Conjunctivae are normal. PERRL      Mouth/Throat: Mucous membranes are moist.       Neck: Supple with no signs of meningismus. Cardiovascular: Regular rate and rhythm. No murmurs, gallops, or rubs. 2+ symmetrical distal pulses are present in all extremities. No JVD. Respiratory: Normal respiratory effort. Lungs are clear to auscultation bilaterally. No wheezes, crackles, or rhonchi.  Gastrointestinal: Soft, non tender, and non distended with positive bowel sounds. No rebound or guarding. Musculoskeletal: Nontender with normal range of motion in all extremities. No edema, cyanosis, or erythema of extremities. Neurologic: Normal speech and language. Face is symmetric. Moving all extremities. No gross focal neurologic deficits are appreciated. Skin: Skin is warm, dry and intact. No rash noted. Psychiatric: Erractic behavior, disheveled  ____________________________________________   LABS (all labs ordered are listed, but only abnormal results are displayed)  Labs Reviewed  COMPREHENSIVE METABOLIC PANEL - Abnormal; Notable for the following:       Result Value   Potassium 3.3 (*)    Glucose, Bld 123 (*)    All other components within normal limits  ETHANOL  CBC  URINE DRUG SCREEN, QUALITATIVE ([facility] ONLY)  HCG, QUANTITATIVE, PREGNANCY  SALICYLATE LEVEL  ACETAMINOPHEN LEVEL   ____________________________________________  EKG  none  ____________________________________________  RADIOLOGY  none  ____________________________________________   PROCEDURES  Procedure(s) performed: None Procedures Critical Care performed:  None ____________________________________________    INITIAL  IMPRESSION / ASSESSMENT AND PLAN / ED COURSE  41 y.o. female ith a history of bipolar disorder and polysubstance abuse who presents for evaluation of erratic behavior under IVC by [facility]. Patient with erratic behavior, disheveled, trying to jump off the bed. Due to concerns for patient's safety she was given IM ativan and benadryl. Sitter at the bedside. Labs for medical clearance pending. Will consult psych.    _________________________ 6:15 PM on 07/04/2017 -----------------------------------------  Patient has been medically cleared and will be admitted to psychiatry  Pertinent labs & imaging results that were available during my care of the patient were reviewed by me and considered in my medical decision making (see chart for details).    ____________________________________________   FINAL CLINICAL IMPRESSION(S) / ED DIAGNOSES  Final diagnoses:  Manic bipolar I disorder ([place])      NEW MEDICATIONS STARTED DURING THIS VISIT:  New Prescriptions   No medications on file     Note:  This document was prepared using Dragon voice recognition  software and may include unintentional dictation errors.    Alfred Levins, [place], MD 07/04/17 [phone]

## 2017-07-04 NOTE — ED Notes (Signed)
Pt sleeping at this time, equal rise and fall of chest noted, sitter at bedside.

## 2017-07-04 NOTE — ED Notes (Signed)
Pt sleeping on and off. Wakes up talking about incidents that have happened in the last couple of days. AS

## 2017-07-04 NOTE — ED Notes (Signed)
Pt sleeping at this time, audible snoring can be heard at this time, equal and unlabored resp noted. Sitter at bedside.

## 2017-07-05 ENCOUNTER — Inpatient Hospital Stay
Admission: AD | Admit: 2017-07-05 | Discharge: 2017-07-29 | DRG: 885 | Disposition: A | Discharge status: Home / Self Care | Payer: BLUE CROSS/BLUE SHIELD | Attending: Psychiatry | Admitting: Psychiatry

## 2017-07-05 ENCOUNTER — Encounter: Payer: Self-pay | Admitting: Psychiatry

## 2017-07-05 DIAGNOSIS — F1220 Cannabis dependence, uncomplicated: Secondary | ICD-10-CM | POA: Diagnosis present

## 2017-07-05 DIAGNOSIS — F3113 Bipolar disorder, current episode manic without psychotic features, severe: Principal | ICD-10-CM | POA: Diagnosis present

## 2017-07-05 DIAGNOSIS — Z885 Allergy status to narcotic agent status: Secondary | ICD-10-CM | POA: Diagnosis not present

## 2017-07-05 DIAGNOSIS — F1120 Opioid dependence, uncomplicated: Secondary | ICD-10-CM | POA: Diagnosis present

## 2017-07-05 DIAGNOSIS — F314 Bipolar disorder, current episode depressed, severe, without psychotic features: Secondary | ICD-10-CM | POA: Diagnosis present

## 2017-07-05 DIAGNOSIS — Z820 Family history of epilepsy and other diseases of the nervous system: Secondary | ICD-10-CM | POA: Diagnosis not present

## 2017-07-05 DIAGNOSIS — N390 Urinary tract infection, site not specified: Secondary | ICD-10-CM | POA: Diagnosis present

## 2017-07-05 DIAGNOSIS — Z8349 Family history of other endocrine, nutritional and metabolic diseases: Secondary | ICD-10-CM

## 2017-07-05 DIAGNOSIS — Z888 Allergy status to other drugs, medicaments and biological substances status: Secondary | ICD-10-CM | POA: Diagnosis not present

## 2017-07-05 DIAGNOSIS — F17210 Nicotine dependence, cigarettes, uncomplicated: Secondary | ICD-10-CM | POA: Diagnosis present

## 2017-07-05 DIAGNOSIS — H409 Unspecified glaucoma: Secondary | ICD-10-CM | POA: Diagnosis present

## 2017-07-05 DIAGNOSIS — F17200 Nicotine dependence, unspecified, uncomplicated: Secondary | ICD-10-CM | POA: Diagnosis present

## 2017-07-05 DIAGNOSIS — Z8249 Family history of ischemic heart disease and other diseases of the circulatory system: Secondary | ICD-10-CM | POA: Diagnosis not present

## 2017-07-05 DIAGNOSIS — F4310 Post-traumatic stress disorder, unspecified: Secondary | ICD-10-CM | POA: Diagnosis present

## 2017-07-05 DIAGNOSIS — F603 Borderline personality disorder: Secondary | ICD-10-CM | POA: Diagnosis present

## 2017-07-05 DIAGNOSIS — F1520 Other stimulant dependence, uncomplicated: Secondary | ICD-10-CM | POA: Diagnosis present

## 2017-07-05 DIAGNOSIS — F411 Generalized anxiety disorder: Secondary | ICD-10-CM | POA: Diagnosis present

## 2017-07-05 DIAGNOSIS — G4700 Insomnia, unspecified: Secondary | ICD-10-CM | POA: Diagnosis present

## 2017-07-05 DIAGNOSIS — Z915 Personal history of self-harm: Secondary | ICD-10-CM

## 2017-07-05 LAB — LITHIUM LEVEL: LITHIUM LVL: 0.63 mmol/L (ref 0.60–1.20)

## 2017-07-05 LAB — CARBAMAZEPINE LEVEL, TOTAL: CARBAMAZEPINE LVL: 6.8 ug/mL (ref 4.0–12.0)

## 2017-07-05 MED ORDER — HYDROXYZINE HCL 25 MG PO TABS: 25.0000 mg | ORAL_TABLET | Freq: Three times a day (TID) | ORAL | Status: DC | PRN

## 2017-07-05 MED ORDER — ARIPIPRAZOLE 10 MG PO TABS
10.0000 mg | ORAL_TABLET | Freq: Every day | ORAL | Status: DC
  Administered 2017-07-05 – 2017-07-06 (×2): 10 mg via ORAL
  Filled 2017-07-05 (×2): qty 1

## 2017-07-05 MED ORDER — LORAZEPAM 1 MG PO TABS
1.0000 mg | ORAL_TABLET | Freq: Every day | ORAL | Status: DC
  Administered 2017-07-05: 1 mg via ORAL
  Filled 2017-07-05: qty 1

## 2017-07-05 MED ORDER — NICOTINE 21 MG/24HR TD PT24
21.0000 mg | MEDICATED_PATCH | Freq: Every day | TRANSDERMAL | Status: DC
  Administered 2017-07-05 – 2017-07-28 (×24): 21 mg via TRANSDERMAL
  Filled 2017-07-05 (×22): qty 1

## 2017-07-05 MED ORDER — BENZTROPINE MESYLATE 1 MG PO TABS
0.5000 mg | ORAL_TABLET | Freq: Every day | ORAL | Status: DC
  Administered 2017-07-05 – 2017-07-14 (×10): 0.5 mg via ORAL
  Filled 2017-07-05 (×11): qty 1

## 2017-07-05 MED ORDER — ALUM & MAG HYDROXIDE-SIMETH 200-200-20 MG/5ML PO SUSP: 30.0000 mL | ORAL | Status: DC | PRN

## 2017-07-05 MED ORDER — LITHIUM CARBONATE 300 MG PO CAPS
300.0000 mg | ORAL_CAPSULE | Freq: Three times a day (TID) | ORAL | Status: DC
  Administered 2017-07-05 – 2017-07-07 (×8): 300 mg via ORAL
  Filled 2017-07-05 (×8): qty 1

## 2017-07-05 MED ORDER — CARBAMAZEPINE 200 MG PO TABS
200.0000 mg | ORAL_TABLET | Freq: Two times a day (BID) | ORAL | Status: DC
  Administered 2017-07-05 – 2017-07-15 (×21): 200 mg via ORAL
  Filled 2017-07-05 (×22): qty 1

## 2017-07-05 MED ORDER — ADULT MULTIVITAMIN W/MINERALS CH: 1.0000 | ORAL_TABLET | Freq: Every day | ORAL | Status: DC

## 2017-07-05 MED ORDER — MAGNESIUM HYDROXIDE 400 MG/5ML PO SUSP: 30.0000 mL | Freq: Every day | ORAL | Status: DC | PRN

## 2017-07-05 MED ORDER — ENSURE ENLIVE PO LIQD
237.0000 mL | Freq: Two times a day (BID) | ORAL | Status: DC
  Administered 2017-07-06 – 2017-07-29 (×45): 237 mL via ORAL

## 2017-07-05 MED ORDER — ACETAMINOPHEN 325 MG PO TABS
650.0000 mg | ORAL_TABLET | Freq: Four times a day (QID) | ORAL | Status: DC | PRN
  Administered 2017-07-05: 650 mg via ORAL
  Filled 2017-07-05: qty 2

## 2017-07-05 MED ORDER — TRAZODONE HCL 50 MG PO TABS: 150.0000 mg | ORAL_TABLET | Freq: Every evening | ORAL | Status: DC | PRN

## 2017-07-05 NOTE — Progress Notes (Signed)
Patient ID: Autumn [name], female   DOB: 03/10/1976, 41 y.o.   MRN: [id number] Patient admitted to the unit  Early this morning as IVC  appear angry and non cooperative with treatment team and refused to sign treatment agreement form and stated that she don't want to be here. Patient denies  any drug use but states that she had not slept for two days now because her friends are driving her crazy at the house. Admission data is complete patient denies SI/HI ideations, no distress noted at this time.

## 2017-07-05 NOTE — BHH Group Notes (Signed)
[facility] LCSW Group Therapy Note  Date/Time: 07/05/17, 3:00pm  Type of Therapy/Topic:  Group Therapy:  Feelings about Diagnosis  Participation Level:  Did Not Attend    August Saucer, LCSW 07/05/2017, 4:17 PM

## 2017-07-05 NOTE — Progress Notes (Signed)
Patient is refusing to sign treatment agreement , states I don't belong here

## 2017-07-05 NOTE — BHH Group Notes (Signed)
[facility] Group Notes:  (Nursing/MHT/Case Management/Adjunct)  Date:  07/05/2017  Time:  3:04 PM  Type of Therapy:  Psychoeducational Skills  Participation Level:  Minimal  Participation Quality:  Appropriate, Attentive and Sharing  Affect:  Appropriate and Flat  Cognitive:  Appropriate  Insight:  Appropriate, Good and Limited  Engagement in Group:  Engaged and Limited  Modes of Intervention:  Discussion, Education and Support  Summary of Progress/Problems:  Autumn [name] 07/05/2017, 3:04 PM

## 2017-07-05 NOTE — Plan of Care (Signed)
Problem: Safety: Goal: Ability to remain free from injury will improve Outcome: Progressing Remains safe on the unit   

## 2017-07-05 NOTE — Progress Notes (Signed)
Admission Note:  Upon admission to the unit patient's skin was assessed and witnessed by Lowell Guitar MHT, skin was found to warm, dry, and intact. Patient was noted to have 3 tattoos; one in her back, her abdomen, and lower torso. Patient was also searched and no contraband found, will continue to monitor.

## 2017-07-05 NOTE — Plan of Care (Signed)
Problem: Safety: Goal: Ability to disclose and discuss suicidal ideas will improve Outcome: Progressing Patient is communicating adequately to staffs and denies any SI\HI ideations at this time Goal: Ability to identify and utilize support systems that promote safety will improve Outcome: Progressing Patient is able to verbalize two stressors in her person and contract safety with the treatment team
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
## 2017-07-05 NOTE — ED Notes (Signed)
Pt sleeping at this time, pt lying on right side in NAD at this time.

## 2017-07-05 NOTE — Progress Notes (Signed)
Recreation Therapy Notes  At approximately 10:15 am, LRT attempted assessment. Patient sleeping and did not wake up when name was called.  Autumn [name], LRT/CTRS 07/05/2017 10:54 AM
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
## 2017-07-05 NOTE — BHH Suicide Risk Assessment (Signed)
[facility] Admission Suicide Risk Assessment   Nursing information obtained from:  Patient Demographic factors:    Current Mental Status:  NA Loss Factors:  NA Historical Factors:  NA Risk Reduction Factors:  Positive coping skills or problem solving skills  Total Time spent with patient: 1 hour Principal Problem: Bipolar I disorder, most recent episode manic, severe without psychotic features ([place]) Diagnosis:   Patient Active Problem List   Diagnosis Date Noted  . Bipolar I disorder, most recent episode manic, severe without psychotic features ([place]) [F31.13] 07/05/2017  . Tobacco use disorder [F17.200] 06/27/2017  . Amphetamine use disorder, severe ([place]) [F15.20] 06/27/2017  . Opioid use disorder, severe, dependence ([place]) [F11.20] 06/27/2017  . Cannabis use disorder, severe, dependence ([place]) [F12.20] 06/27/2017  . Hx of glaucoma [Z86.69] 10/02/2013   Subjective Data:   Continued Clinical Symptoms:  Alcohol Use Disorder Identification Test Final Score (AUDIT): 0 The "Alcohol Use Disorders Identification Test", Guidelines for Use in Primary Care, Second Edition.  World Pharmacologist [facility]). Score between 0-7:  no or low risk or alcohol related problems. Score between 8-15:  moderate risk of alcohol related problems. Score between 16-19:  high risk of alcohol related problems. Score 20 or above:  warrants further diagnostic evaluation for alcohol dependence and treatment.   CLINICAL FACTORS:   Alcohol/Substance Abuse/Dependencies More than one psychiatric diagnosis    Psychiatric Specialty Exam: Physical Exam  ROS  Blood pressure 118/84, pulse 92, temperature 98.2 F (36.8 C), temperature source Oral, resp. rate 16, height 5\' 1"  (1.549 m), weight 38.1 kg (84 lb), SpO2 100 %.Body mass index is 15.87 kg/m.                                                    Sleep:  Number of Hours: 2      COGNITIVE FEATURES THAT CONTRIBUTE TO RISK:   Closed-mindedness    SUICIDE RISK:   Moderate:  Frequent suicidal ideation with limited intensity, and duration, some specificity in terms of plans, no associated intent, good self-control, limited dysphoria/symptomatology, some risk factors present, and identifiable protective factors, including available and accessible social support.  PLAN OF CARE: admit  I certify that inpatient services furnished can reasonably be expected to improve the patient's condition.   Hildred Priest, MD 07/05/2017, 2:34 PM

## 2017-07-05 NOTE — ED Notes (Addendum)
Pt updated on plan of care and going downstairs for further treatment and evaluation. Pt verbalizes understanding of this.

## 2017-07-05 NOTE — Progress Notes (Signed)
Affect flat. Tremors noted.  When asked why shaking states "I'm cold"  Denies SI/HI/AVH.  States that she is here because she was depending on her friends to take care of her instead of taking care of herself.  No further elaboration.  Medication and group compliant.  Support and encouragement offered. Safety maintained.

## 2017-07-05 NOTE — Tx Team (Signed)
Initial Treatment Plan 07/05/2017 5:10 AM Autumn [name] [id number]    PATIENT STRESSORS: Financial difficulties Medication change or noncompliance   PATIENT STRENGTHS: Average or above average intelligence Capable of independent living Work skills   PATIENT IDENTIFIED PROBLEMS: Mood instability  Non compliance with treatments and medications  Odd bizarre behavior                 DISCHARGE CRITERIA:  Improved stabilization in mood, thinking, and/or behavior Motivation to continue treatment in a less acute level of care Safe-care adequate arrangements made Verbal commitment to aftercare and medication compliance  PRELIMINARY DISCHARGE PLAN: Outpatient therapy Return to previous living arrangement  PATIENT/FAMILY INVOLVEMENT: This treatment plan has been presented to and reviewed with the patient, Autumn [name]. The patient have been given the opportunity to ask questions and make suggestions.  Clemens Catholic, RN 07/05/2017, 5:10 AM

## 2017-07-05 NOTE — Progress Notes (Addendum)
Initial Nutrition Assessment  DOCUMENTATION CODES:   Non-severe (moderate) malnutrition in context of chronic illness  INTERVENTION:   Ensure Enlive po BID, each supplement provides 350 kcal and 20 grams of protein  MVI  NUTRITION DIAGNOSIS:   Malnutrition (moderate) related to  (bipolar disorder, drug abuse ) as evidenced by 16 percent weight loss in 10 months, moderate depletions of muscle mass, moderate depletions of body fat.  GOAL:   Patient will meet greater than or equal to 90% of their needs  MONITOR:   PO intake, Supplement acceptance  REASON FOR ASSESSMENT:   Malnutrition Screening Tool    ASSESSMENT:   41 y.o. female ith a history of bipolar disorder and polysubstance abuse who presents for evaluation of erratic behavior under IVC by [facility]. patient was discharged from the hospital 3 days ago when she was admitted for similar complaint.   Pt reports not eating anything for three days pta. RD suspects that pt has probably had intermittent poor appetite and oral intake for months r/t drug abuse and bipolar disorder. Pt was eating 50-100% of meals prior to discharge last week. Per chart, pt has lost 16lbs(16%) in 10 months; this is significant. RD will order supplements to help pt meet estimated needs. Pt with hypokalemia; monitor and supplement as needed per MD discretion.   Medications reviewed and include: lithium carbonate, nicotine   Labs reviewed: K 3.3(L)  Diet Order:  Diet regular Room service appropriate? Yes; Fluid consistency: Thin  Skin:  Reviewed, no issues  Last BM:  8/27  Height:   Ht Readings from Last 1 Encounters:  07/05/17 5\' 1"  (1.549 m)    Weight:   Wt Readings from Last 1 Encounters:  07/05/17 84 lb (38.1 kg)    Ideal Body Weight:  47.7 kg  BMI:  Body mass index is 15.87 kg/m.  Estimated Nutritional Needs:   Kcal:  1350-1550kcal/day   Protein:  57-65g/day   Fluid:  >1.3L/day   EDUCATION NEEDS:   No education  needs identified at this time  Koleen Distance MS, RD, [place] Pager #[phone] After Hours Pager: [phone]

## 2017-07-05 NOTE — Plan of Care (Signed)
Problem: Activity: Goal: Sleeping patterns will improve Outcome: Progressing Patient slept for Estimated Hours of 2; Precautionary checks every 15 minutes for safety maintained, room free of safety hazards, patient sustains no injury or falls during this shift.

## 2017-07-05 NOTE — ED Notes (Signed)
Pt did not sign ED Discharge due to admission to [facility]

## 2017-07-05 NOTE — Progress Notes (Signed)
Recreation Therapy Notes  Date: 08.28.18 Time: 9:30 am Location: Craft Room  Group Topic: Goal Setting  Goal Area(s) Addresses:  Patient will be able to identify a personal goal. Patient will verbalize benefit of setting goals.  Behavioral Response: Attentive, Left early  Intervention: Step By Step  Activity: Patients were given a worksheet with a foot on it and were instructed to write a goal towards their recovery inside the foot and positive comments outside of the foot.  Education: LRT educated patients on how to celebrate achieving their goals in a healthy way.  Education Outcome: Patient left before LRT educated group.  Clinical Observations/Feedback: Patient wrote a goal and encouraging words. Patient left group at approximately 9:45 am with Dr. Jerilee Hoh. Patient did not return to group.  Leonette Monarch, LRT/CTRS 07/05/2017 10:08 AM

## 2017-07-06 MED ORDER — ACETAMINOPHEN 325 MG PO TABS
650.0000 mg | ORAL_TABLET | Freq: Three times a day (TID) | ORAL | Status: AC
  Administered 2017-07-06 – 2017-07-07 (×3): 650 mg via ORAL
  Filled 2017-07-06 (×3): qty 2

## 2017-07-06 MED ORDER — ARIPIPRAZOLE 5 MG PO TABS
15.0000 mg | ORAL_TABLET | Freq: Every day | ORAL | Status: DC
  Administered 2017-07-07: 15 mg via ORAL
  Filled 2017-07-06: qty 1

## 2017-07-06 MED ORDER — LORAZEPAM 2 MG PO TABS
2.0000 mg | ORAL_TABLET | Freq: Every day | ORAL | Status: DC
  Administered 2017-07-06 – 2017-07-07 (×2): 2 mg via ORAL
  Filled 2017-07-06 (×2): qty 1

## 2017-07-06 MED ORDER — ARIPIPRAZOLE 5 MG PO TABS
5.0000 mg | ORAL_TABLET | Freq: Once | ORAL | Status: AC
  Administered 2017-07-06: 5 mg via ORAL
  Filled 2017-07-06: qty 1

## 2017-07-06 NOTE — Progress Notes (Signed)
[facility] MD Progress Note  07/06/2017 9:19 AM Autumn [name]  MRN:  [id number] Subjective:  Patient is a 41 year old separated Caucasian female from [place]. This patient carries a diagnosis of bipolar disorder type and substance abuse. She presented to the emergency department on the involuntary commitment for erratic behavior In the [place] parking lot, she was knocking on people's windows and running around the parking lot.  Patient was discharged from our unit on August 24 on Tegretol and lithium.  Urine toxicology was negative and her alcohol level was below detection limit. Patient denies engaging in substance abuse after discharge.  Pt's boyfriend reportedthat since discharge pt has not slept and only paced in the hallways during the weekend. SO states pt did wander off Saturday and was returned by [employer] early Sunday morning when Mobile Crisis was called. Patient has been telling him she is talking to that relatives and that she went to [facility] because she needed to talk to [place].  8/29 patient says that she was unable to sleep very well last night because she was trying to live up to her heroes. She was unable to explain what this meant. She appears very disheveled. Her thought process is somewhat disorganized. Denies major side effects. As far as physical complaints he complains of pain in her foot--- she said she hurt herself as she was walking  the [place] parking and pushing shopping cars for hours.hours  Per nursing: D:Pt denies SI/HI/AVH, affect is flat but brightens upon approach, Patient is alert and oriented x 4, thoughts are organized no bizarre behavior noted. Pt is pleasant and cooperative, sheappears less anxious, rated depression a 2/10 on a scale of ( 0- 10). Patient is interacting with peers and staff appropriately, patient's thoughts are coherent and logical.  A:Pt was offered support and encouragement. Pt was given scheduled medications. Pt was  encouraged to attend groups. Q 15 minute checks were done for safety.  R:Pt attends groups and interacts well with peers and staff. Pt is taking medication. Pt has no complaints.Pt receptive to treatment and safety maintained on unit.  Principal Problem: Bipolar I disorder, most recent episode manic, severe without psychotic features ([place]) Diagnosis:   Patient Active Problem List   Diagnosis Date Noted  . Bipolar I disorder, most recent episode manic, severe without psychotic features ([place]) [F31.13] 07/05/2017  . Tobacco use disorder [F17.200] 06/27/2017  . 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 Opioid use disorder, severe, dependence ([place]) [F11.20] 06/27/2017  . Cannabis use disorder, severe, dependence ([place]) [F12.20] 06/27/2017  . Hx of glaucoma [Z86.69] 10/02/2013   Total Time spent with patient: 30 minutes  Past Psychiatric History: Patient has been diagnosed with schizoaffective disorder, bipolar disorder, borderline personality disorder, depression and generalized anxiety disorder. She is being hospitalized here in our unit a multitude of times. She also has been [facility] at least twice. Patient says she has attempted to walk in front of cars in the past. She does report history of self injury by cutting but has not engaged in this in years  She was just discharged from our unit on August 24. She'll return to the ER manic again on August 27.  Past Medical History:  Past Medical History:  Diagnosis Date  . Allergy    Seasonal, Ultram (seizures)  . Anemia 2005  . Bipolar 1 disorder ([place])   . Glaucoma   . Seizures ([place]) 2008    Past Surgical History:  Procedure Laterality Date  .  BREAST BIOPSY Right    x 2  . CESAREAN SECTION     x 2   Family History:  Family History  Problem Relation Age of Onset  . Hypertension Mother   . Hyperlipidemia Mother   . Parkinson's disease Father    Family Psychiatric  History: denies  Social  History:  History  Alcohol Use No    Comment: Ocassional     History  Drug Use    Comment: percocet -last use over 1 week ago    Social History   Social History  . Marital status: Single    Spouse name: N/A  . Number of children: N/A  . Years of education: N/A   Social History Main Topics  . Smoking status: Current Every Day Smoker    Packs/day: 1.00    Years: 9.00  . Smokeless tobacco: Never Used     Comment: Patient not interested  . Alcohol use No     Comment: Ocassional  . Drug use: Yes     Comment: percocet -last use over 1 week ago  . Sexual activity: No   Other Topics Concern  . None   Social History Narrative  . None     Current Medications: Current Facility-Administered Medications  Medication Dose Route Frequency Provider Last Rate Last Dose  . acetaminophen (TYLENOL) tablet 650 mg  650 mg Oral Q6H PRN [name], [name], MD   650 mg at 07/05/17 2145  . alum & mag hydroxide-simeth (MAALOX/MYLANTA) 200-200-20 MG/5ML suspension 30 mL  30 mL Oral Q4H PRN [name], [name], MD      . Derrill Memo ON 07/07/2017] ARIPiprazole (ABILIFY) tablet 15 mg  15 mg Oral Daily Hernandez-Gonzalez, Seth Bake, MD      . ARIPiprazole (ABILIFY) tablet 5 mg  5 mg Oral Once Hildred Priest, MD      . benztropine (COGENTIN) tablet 0.5 mg  0.5 mg Oral Daily Hildred Priest, MD   0.5 mg at 07/06/17 0807  . carbamazepine (TEGRETOL) tablet 200 mg  200 mg Oral BID AC & HS [name], [name], MD   200 mg at 07/06/17 0808  . feeding supplement (ENSURE ENLIVE) (ENSURE ENLIVE) liquid 237 mL  237 mL Oral BID BM Hildred Priest, MD   237 mL at 07/06/17 1000  . lithium carbonate capsule 300 mg  300 mg Oral TID WC [name], [name], MD   300 mg at 07/06/17 0807  . LORazepam (ATIVAN) tablet 2 mg  2 mg Oral QHS Hernandez-Gonzalez, Seth Bake, MD      . magnesium hydroxide (MILK OF MAGNESIA) suspension 30 mL  30 mL Oral Daily PRN [name], [name], MD      .  nicotine (NICODERM CQ - dosed in mg/24 hours) patch 21 mg  21 mg Transdermal Daily [name], [name], MD   21 mg at 07/06/17 0807    Lab Results:  Results for orders placed or performed during the hospital encounter of 07/05/17 (from the past 48 hour(s))  Lithium level     Status: None   Collection Time: 07/05/17  6:45 AM  Result Value Ref Range   Lithium Lvl 0.63 0.60 - 1.20 mmol/L  Carbamazepine level, total     Status: None   Collection Time: 07/05/17  6:45 AM  Result Value Ref Range   Carbamazepine Lvl 6.8 4.0 - 12.0 ug/mL    Blood Alcohol level:  Lab Results  Component Value Date   ETH <5 07/04/2017   ETH 18 (H) 06/24/2017  Metabolic Disorder Labs: Lab Results  Component Value Date   HGBA1C 5.0 06/27/2017   MPG 96.8 06/27/2017   No results found for: PROLACTIN Lab Results  Component Value Date   CHOL 152 06/27/2017   TRIG 68 06/27/2017   HDL 59 06/27/2017   CHOLHDL 2.6 06/27/2017   VLDL 14 06/27/2017   LDLCALC 79 06/27/2017   LDLCALC 97 10/02/2013    Physical Findings: AIMS:  , ,  ,  ,    CIWA:    COWS:     Musculoskeletal: Strength & Muscle Tone: within normal limits Gait & Station: normal Patient leans: N/A  Psychiatric Specialty Exam: Physical Exam  Constitutional: She is oriented to person, place, and time. She appears well-developed and well-nourished.  HENT:  Head: Normocephalic and atraumatic.  Eyes: Conjunctivae and EOM are normal.  Neck: Normal range of motion.  Respiratory: Effort normal.  Musculoskeletal: Normal range of motion.  Neurological: She is alert and oriented to person, place, and time.    Review of Systems  Constitutional: Negative.   HENT: Negative.   Eyes: Negative.   Respiratory: Negative.   Cardiovascular: Negative.   Gastrointestinal: Negative.   Genitourinary: Negative.   Musculoskeletal: Negative.   Skin: Negative.   Neurological: Negative.   Endo/Heme/Allergies: Negative.   Psychiatric/Behavioral:  Positive for depression. Negative for hallucinations, memory loss, substance abuse and suicidal ideas. The patient is not nervous/anxious and does not have insomnia.     Blood pressure 112/68, pulse (!) 102, temperature 98 F (36.7 C), temperature source Oral, resp. rate 18, height 5\' 1"  (1.549 m), weight 38.1 kg (84 lb), SpO2 100 %.Body mass index is 15.87 kg/m.  General Appearance: Disheveled  Eye Contact:  Fair  Speech:  Slow  Volume:  Decreased  Mood:  Dysphoric  Affect:  Blunt  Thought Process:  Linear and Descriptions of Associations: Loose  Orientation:  Full (Time, Place, and Person)  Thought Content:  Hallucinations: None  Suicidal Thoughts:  No  Homicidal Thoughts:  No  Memory:  Immediate;   Fair Recent;   Fair Remote;   Fair  Judgement:  Fair  Insight:  Fair  Psychomotor Activity:  Decreased  Concentration:  Concentration: Poor and Attention Span: Poor  Recall:  Poor  Fund of Knowledge:  Fair  Language:  Fair  Akathisia:  No  Handed:    AIMS (if indicated):     Assets:  Armed forces logistics/support/administrative officer Social Support  ADL's:  Intact  Cognition:  WNL  Sleep:  Number of Hours: 7     Treatment Plan Summary: Daily contact with patient to assess and evaluate symptoms and progress in treatment and Medication management  Bipolar disorder the patient will be continued on Tegretol 200 mg twice a day and lithium 300 mg po bid  Abilify will be increased to 15 mg today  EPS: continue benztropine 0.5 mg po q day--prior dystonia with abilify  Insomnia:will increase ativan to 2 mg po qhs due to insomnia  Amphetamines, opiates and marijuana use disorder patient will be referred to intensive outpatient substance abuse upon discharge--- urine toxicology was negative. No alcohol in her system  No evidence of withdrawals at this time  Tobacco use disorder: Continue nicotine patches 21 mg a day  Labs: Tegretol level was 6.8, her lithium level was 0.63.  As lithium level has  been increased Will recheck level prior to discharge (in about 5 days)  Dispo: back home with boyfriend  F/u: [facility]  Possible d/c early next week. Will  need tegretol and lithium level prior to discharge  Hildred Priest, MD 07/06/2017, 9:19 AM

## 2017-07-06 NOTE — Progress Notes (Signed)
Recreation Therapy Notes  INPATIENT RECREATION THERAPY ASSESSMENT  Patient Details Name: Autumn [name] MRN: [id number] DOB: 1976-08-21 Today's Date: 07/06/2017  Patient Stressors: Family, Relationship (Not a good relationship with family; has not been getting along with boyfriend - pt feels like he treats her like a child)  Coping Skills:   Isolate, Arguments, Exercise, Art/Dance, Music, Sports, Other (Comment) (Walk away, smile, watch movies, read)  Personal Challenges: Decision-Making, Relationships, Self-Esteem/Confidence, Social Interaction, Trusting Others  Leisure Interests (2+):  Individual - Reading, Music - Listen  Awareness of Community Resources:  Yes  Community Resources:  Library, Other (Comment) Teaching laboratory technician)  Current Use: No  If no, Barriers?: Other (Comment) (Pt reports she was not allowed to leave the house)  Patient Strengths:  Sense of humor, caring heart  Patient Identified Areas of Improvement:  Taking care of herself - eat more and personal hygiene  Current Recreation Participation:  Eating, watching TV, listening to music, smoking cigarettes  Patient Goal for Hospitalization:  Rest and eat more  [place] of Residence:  [place] of Residence:  [place]   Current SI (including self-harm):  No  Current HI:  No  Consent to Intern Participation: N/A  LRT offered to set goals for patient, but patient refused. Due to patient's refusal, LRT will not develop a Recreational Therapy Care Plan at this time. If patient's status changes, LRT will develop a Recreational Therapy Care Plan.  Leonette Monarch, LRT/CTRS  07/06/2017, 4:12 PM

## 2017-07-06 NOTE — Plan of Care (Signed)
Problem: Activity: Goal: Sleeping patterns will improve Outcome: Not Progressing Patient reports being sleepy this am. Reports did not sleep well last pm

## 2017-07-06 NOTE — Progress Notes (Signed)
Pt noted throughout the unit acting bizarre and laughing inappropriately. Denies SI, HI, AVH. Pt reports here because she got into an argument with her boyfriend and he left her here. Pt showing some confusion at time.  Encouragement and support offered. Safety checks maintained. Medications given as prescribed. Pt receptive and remains safe on unit with q 15 min checks.

## 2017-07-06 NOTE — Tx Team (Signed)
Interdisciplinary Treatment and Diagnostic Plan Update  07/06/2017 Time of Session: 11:00 AM Autumn [name] MRN: [id number]  Principal Diagnosis: Bipolar I disorder, most recent episode manic, severe without psychotic features ([place])  Secondary Diagnoses: Principal Problem:   Bipolar I disorder, most recent episode manic, severe without psychotic features ([place]) Active Problems:   Hx of glaucoma   Tobacco use disorder   Amphetamine use disorder, severe (HCC)   Opioid use disorder, severe, dependence ([place])   Cannabis use disorder, severe, dependence ([place])   Current Medications:  Current Facility-Administered Medications  Medication Dose Route Frequency Provider Last Rate Last Dose  . acetaminophen (TYLENOL) tablet 650 mg  650 mg Oral Q6H PRN [name], [name], MD   650 mg at 07/05/17 2145  . alum & mag hydroxide-simeth (MAALOX/MYLANTA) 200-200-20 MG/5ML suspension 30 mL  30 mL Oral Q4H PRN [name], [name], MD      . Derrill Memo ON 07/07/2017] ARIPiprazole (ABILIFY) tablet 15 mg  15 mg Oral Daily Hernandez-Gonzalez, Seth Bake, MD      . ARIPiprazole (ABILIFY) tablet 5 mg  5 mg Oral Once Hildred Priest, MD      . benztropine (COGENTIN) tablet 0.5 mg  0.5 mg Oral Daily Hildred Priest, MD   0.5 mg at 07/06/17 0807  . carbamazepine (TEGRETOL) tablet 200 mg  200 mg Oral BID AC & HS [name], [name], MD   200 mg at 07/06/17 0808  . feeding supplement (ENSURE ENLIVE) (ENSURE ENLIVE) liquid 237 mL  237 mL Oral BID BM Hildred Priest, MD   237 mL at 07/06/17 1000  . lithium carbonate capsule 300 mg  300 mg Oral TID WC [name], [name], MD   300 mg at 07/06/17 0807  . LORazepam (ATIVAN) tablet 2 mg  2 mg Oral QHS Hernandez-Gonzalez, Seth Bake, MD      . magnesium hydroxide (MILK OF MAGNESIA) suspension 30 mL  30 mL Oral Daily PRN [name], [name], MD      . nicotine (NICODERM CQ - dosed in mg/24 hours) patch 21 mg  21 mg Transdermal Daily  [name], [name], MD   21 mg at 07/06/17 0807   PTA Medications: Prescriptions Prior to Admission  Medication Sig Dispense Refill Last Dose  . carbamazepine (TEGRETOL) 200 MG tablet Take 1 tablet (200 mg total) by mouth 2 (two) times daily. 60 tablet 0 07/04/2017 at am  . citalopram (CELEXA) 20 MG tablet Take 1 tablet (20 mg total) by mouth daily. 30 tablet 0 07/04/2017 at am  . clotrimazole (GYNE-LOTRIMIN 3) 2 % vaginal cream Place 1 Applicatorful vaginally at bedtime. 21 g 0 unknown at unknown  . lithium carbonate (LITHOBID) 300 MG CR tablet Take 1 tablet (300 mg total) by mouth every 12 (twelve) hours. 60 tablet 0 07/04/2017 at am  . traZODone (DESYREL) 150 MG tablet Take 1 tablet (150 mg total) by mouth at bedtime. 30 tablet 0 07/03/2017 at pm    Patient Stressors: Financial difficulties Medication change or noncompliance  Patient Strengths: Average or above average intelligence Capable of independent living Work skills  Treatment Modalities: Medication Management, Group therapy, Case management,  1 to 1 session with clinician, Psychoeducation, Recreational therapy.   Physician Treatment Plan for Primary Diagnosis: Bipolar I disorder, most recent episode manic, severe without psychotic features ([place]) Long Term Goal(s): Improvement in symptoms so as ready for discharge Improvement in symptoms so as ready for discharge   Short Term Goals: Ability to identify changes in lifestyle to reduce recurrence of condition  will improve Ability to demonstrate self-control will improve Compliance with prescribed medications will improve Ability to identify changes in lifestyle to reduce recurrence of condition will improve Ability to identify and develop effective coping behaviors will improve  Medication Management: Evaluate patient's response, side effects, and tolerance of medication regimen.  Therapeutic Interventions: 1 to 1 sessions, Unit Group sessions and Medication  administration.  Evaluation of Outcomes: Progressing  Physician Treatment Plan for Secondary Diagnosis: Principal Problem:   Bipolar I disorder, most recent episode manic, severe without psychotic features ([place]) Active Problems:   Hx of glaucoma   Tobacco use disorder   Amphetamine use disorder, severe (H[facility])   Opioid use disorder, severe, dependence ([place])   Cannabis use disorder, severe, dependence ([place])  Long Term Goal(s): Improvement in symptoms so as ready for discharge Improvement in symptoms so as ready for discharge   Short Term Goals: Ability to identify changes in lifestyle to reduce recurrence of condition will improve Ability to demonstrate self-control will improve Compliance with prescribed medications will improve Ability to identify changes in lifestyle to reduce recurrence of condition will improve Ability to identify and develop effective coping behaviors will improve     Medication Management: Evaluate patient's response, side effects, and tolerance of medication regimen.  Therapeutic Interventions: 1 to 1 sessions, Unit Group sessions and Medication administration.  Evaluation of Outcomes: Progressing   RN Treatment Plan for Primary Diagnosis: Bipolar I disorder, most recent episode manic, severe without psychotic features ([place]) Long Term Goal(s): Knowledge of disease and therapeutic regimen to maintain health will improve  Short Term Goals: Ability to demonstrate self-control, Ability to verbalize feelings will improve and Compliance with prescribed medications will improve  Medication Management: RN will administer medications as ordered by provider, will assess and evaluate patient's response and provide education to patient for prescribed medication. RN will report any adverse and/or side effects to prescribing provider.  Therapeutic Interventions: 1 on 1 counseling sessions, Psychoeducation, Medication administration, Evaluate responses to treatment, Monitor  vital signs and CBGs as ordered, Perform/monitor CIWA, COWS, AIMS and Fall Risk screenings as ordered, Perform wound care treatments as ordered.  Evaluation of Outcomes: Progressing   LCSW Treatment Plan for Primary Diagnosis: Bipolar I disorder, most recent episode manic, severe without psychotic features ([place]) Long Term Goal(s): Safe transition to appropriate next level of care at discharge, Engage patient in therapeutic group addressing interpersonal concerns.  Short Term Goals: Engage patient in aftercare planning with referrals and resources, Increase social support and Identify triggers associated with mental health/substance abuse issues  Therapeutic Interventions: Assess for all discharge needs, 1 to 1 time with Social worker, Explore available resources and support systems, Assess for adequacy in community support network, Educate family and significant other(s) on suicide prevention, Complete Psychosocial Assessment, Interpersonal group therapy.  Evaluation of Outcomes: Progressing   Progress in Treatment: Attending groups: Yes. Participating in groups: Yes. Taking medication as prescribed: Yes. Toleration medication: Yes. Family/Significant other contact made: No, will contact:  CSW assessing appropriate contact. Patient understands diagnosis: Yes. Discussing patient identified problems/goals with staff: Yes. Medical problems stabilized or resolved: Yes. Denies suicidal/homicidal ideation: Yes. Issues/concerns per patient self-inventory: No.  New problem(s) identified: No, Describe:  None.  New Short Term/Long Term Goal(s): Patient stated that her goal is to take better care of her health.  Discharge Plan or Barriers: CSW assessing appropriate discharge plan.  Reason for Continuation of Hospitalization: Anxiety Mania Other; describe paranoia   Estimated Length of Stay: 3-5 days   Attendees: Patient:  Autumn [name]  07/06/2017 11:28 AM  Physician: Dr. Merlyn Albert, MD  07/06/2017 11:28 AM  Nursing: Polly Cobia, RN  07/06/2017 11:28 AM  RN Care Manager: 07/06/2017 11:28 AM  Social Worker: Glorious Peach, MSW, LCSW-A 07/06/2017 11:28 AM  Recreational Therapist: Drue Flirt, [place], [place]  07/06/2017 11:28 AM  Other:  07/06/2017 11:28 AM  Other:  07/06/2017 11:28 AM  Other: 07/06/2017 11:28 AM    Scribe for Treatment Team: Emilie Rutter, [place] 07/06/2017 11:28 AM

## 2017-07-06 NOTE — BHH Group Notes (Signed)
[facility] Group Notes:  (Nursing/MHT/Case Management/Adjunct)  Date:  07/06/2017  Time:  4:49 AM  Type of Therapy:  Group Therapy  Participation Level:  Active  Participation Quality:  Appropriate  Affect:  Appropriate  Cognitive:  Appropriate  Insight:  Appropriate  Engagement in Group:  Engaged  Modes of Intervention:  Discussion  Summary of Progress/Problems:  Kandis Fantasia 07/06/2017, 4:49 AM

## 2017-07-06 NOTE — BHH Group Notes (Signed)
[facility] Group Notes:  (Nursing/MHT/Case Management/Adjunct)  Date:  07/06/2017  Time:  9:54 PM  Type of Therapy:  Group Therapy  Participation Level:  Minimal  Participation Quality:  Appropriate  Affect:  Appropriate  Cognitive:  Alert  Insight:  Good  Engagement in Group:  Engaged  Modes of Intervention:  Support  Summary of Progress/Problems:  Autumn [name] 07/06/2017, 9:54 PM

## 2017-07-06 NOTE — Progress Notes (Signed)
Recreation Therapy Notes  Date: 08.29.18 Time: 9:30 am Location: Craft Room  Group Topic: Self-esteem  Goal Area(s) Addresses:  Patient will identify positive traits about self. Patient will identify at least one coping skill.  Behavioral Response: Attentive  Intervention: All About Me  Activity: Patients were instructed to make an All About Me pamphlet including their life motto, positive traits about themselves, healthy coping skills, and their healthy support system.  Education: LRT educated patients on ways to increase their self-esteem.  Education Outcome: In group clarification offered   Clinical Observations/Feedback: Patient wrote positive traits, healthy coping skills, and her support system. Patient did not contribute to group discussion.  Leonette Monarch, LRT/CTRS 07/06/2017 10:10 AM

## 2017-07-06 NOTE — Progress Notes (Signed)
D:Pt denies SI/HI/AVH, affect is flat but brightens upon approach, Patient is alert and oriented x 4, thoughts are organized no bizarre behavior noted. Pt is pleasant and cooperative, she appears less anxious, rated depression a 2/10 on a scale of ( 0- 10). Patient is interacting with peers and staff appropriately, patient's thoughts are coherent and logical.  A:Pt was offered support and encouragement. Pt was given scheduled medications. Pt was encouraged to attend groups. Q 15 minute checks were done for safety.  R:Pt attends groups and interacts well with peers and staff. Pt is taking medication. Pt has no complaints.Pt receptive to treatment and safety maintained on unit.

## 2017-07-06 NOTE — BHH Suicide Risk Assessment (Signed)
[facility] INPATIENT:  Family/Significant Other Suicide Prevention Education  Suicide Prevention Education:  Patient Refusal for Family/Significant Other Suicide Prevention Education: The patient Autumn [name] has refused to provide written consent for family/significant other to be provided Family/Significant Other Suicide Prevention Education during admission and/or prior to discharge.  Physician notified.  Emilie Rutter, MSW, LCSW-A 07/06/2017, 12:06 PM

## 2017-07-06 NOTE — BHH Group Notes (Signed)
[facility] Group Notes:  (Nursing/MHT/Case Management/Adjunct)  Date:  07/06/2017  Time:  5:44 PM  Type of Therapy:  Psychoeducational Skills  Participation Level:  Minimal  Participation Quality:  Appropriate  Affect:  Appropriate  Cognitive:  Appropriate  Insight:  Appropriate  Engagement in Group:  Poor  Modes of Intervention:  Socialization  Summary of Progress/Problems:  Adela Lank [facility] 07/06/2017, 5:44 PM

## 2017-07-07 MED ORDER — ONDANSETRON 4 MG PO TBDP
4.0000 mg | ORAL_TABLET | Freq: Once | ORAL | Status: AC
  Administered 2017-07-07: 4 mg via ORAL
  Filled 2017-07-07: qty 1

## 2017-07-07 MED ORDER — ONDANSETRON 4 MG PO TBDP: 4.0000 mg | ORAL_TABLET | Freq: Three times a day (TID) | ORAL | Status: DC | PRN

## 2017-07-07 MED ORDER — ARIPIPRAZOLE 5 MG PO TABS
5.0000 mg | ORAL_TABLET | Freq: Once | ORAL | Status: AC
  Administered 2017-07-07: 5 mg via ORAL
  Filled 2017-07-07: qty 1

## 2017-07-07 MED ORDER — LITHIUM CARBONATE ER 450 MG PO TBCR: 450.0000 mg | EXTENDED_RELEASE_TABLET | Freq: Two times a day (BID) | ORAL | Status: DC

## 2017-07-07 MED ORDER — LITHIUM CARBONATE ER 450 MG PO TBCR
450.0000 mg | EXTENDED_RELEASE_TABLET | Freq: Two times a day (BID) | ORAL | Status: DC
  Administered 2017-07-07 – 2017-07-15 (×16): 450 mg via ORAL
  Filled 2017-07-07 (×16): qty 1

## 2017-07-07 MED ORDER — ARIPIPRAZOLE 10 MG PO TABS
20.0000 mg | ORAL_TABLET | Freq: Every day | ORAL | Status: DC
  Administered 2017-07-08 – 2017-07-11 (×4): 20 mg via ORAL
  Filled 2017-07-07 (×4): qty 2

## 2017-07-07 NOTE — Progress Notes (Signed)
Pt seems to be responding to internal stimuli at times. She denies SI and HI. She reports feeling nauseated zofran given. Pt visible on unit. She is compliant with medications. Will continue to monitor.

## 2017-07-07 NOTE — Progress Notes (Signed)
D:Pt denies SI/HI/AVH, affect is flat but brightens upon approach, Patient is alert and oriented x 4, thoughts are organized no bizarre behavior or hostility towards staff. Pt is pleasant and cooperative, sheappears less anxious, rated depression a 4/10 on a scale of ( 0- 10). Patient is interacting with peers and staff appropriately, patient's thoughts are coherent and logical.  A:Pt was offered support and encouragement. Pt was given scheduled medications. Pt was encouraged to attend groups. Q 15 minute checks were done for safety.  R:Pt attends groups and interacts well with peers and staff. Pt is taking medication. Pt has no complaints.Pt receptive to treatment and safety maintained on unit.

## 2017-07-07 NOTE — BHH Group Notes (Signed)
Goals Group Date/Time: 07/07/2017 9:00 AM Type of Therapy and Topic: Group Therapy: Goals Group: SMART Goals   Participation Level: Moderate  Description of Group:    The purpose of a daily goals group is to assist and guide patients in setting recovery/wellness-related goals. The objective is to set goals as they relate to the crisis in which they were admitted. Patients will be using SMART goal modalities to set measurable goals. Characteristics of realistic goals will be discussed and patients will be assisted in setting and processing how one will reach their goal. Facilitator will also assist patients in applying interventions and coping skills learned in psycho-education groups to the SMART goal and process how one will achieve defined goal.   Therapeutic Goals:   -Patients will develop and document one goal related to or their crisis in which brought them into treatment.  -Patients will be guided by LCSW using SMART goal setting modality in how to set a measurable, attainable, realistic and time sensitive goal.  -Patients will process barriers in reaching goal.  -Patients will process interventions in how to overcome and successful in reaching goal.   Patient's Goal:Pt goal is to "rest" and to attend groups.  Pt discussed her readmission and said that she was "exercising" in the [place] parking lot, prior to her IVC/commitmant.   Therapeutic Modalities:  Motivational Interviewing  Art gallery manager  SMART goals setting   Lurline Idol, [place]

## 2017-07-07 NOTE — Progress Notes (Signed)
Recreation Therapy Notes  Date: 08.30.18 Time: 9:30 am Location: Craft Room  Group Topic: Leisure Education  Goal Area(s) Addresses:  Patient will identify things they are grateful for. Patient will identify how being grateful can influence decision making.  Behavioral Response: Attentive, Interactive  Intervention: Grateful Wheel  Activity: Patients were given an I Am Grateful For worksheet and were instructed to write things they are grateful for under each category.   Education: LRT educated patients on leisure.   Education Outcome: Acknowledges education/In group clarification offered   Clinical Observations/Feedback: Patient wrote things she is grateful for. Patient contributed to group discussion by stating things she is grateful for.  Leonette Monarch, LRT/CTRS 07/07/2017 10:14 AM

## 2017-07-07 NOTE — Plan of Care (Signed)
Problem: Safety: Goal: Ability to disclose and discuss suicidal ideas will improve Outcome: Progressing Pt remains safe while in hospital injury free. Denies SI and HI.

## 2017-07-07 NOTE — BHH Group Notes (Signed)
[facility] LCSW Group Therapy Note  Date/Time: 07/07/17, 1300  Type of Therapy/Topic:  Group Therapy:  Balance in Life  Participation Level:  minimal  Description of Group:    This group will address the concept of balance and how it feels and looks when one is unbalanced. Patients will be encouraged to process areas in their lives that are out of balance, and identify reasons for remaining unbalanced. Facilitators will guide patients utilizing problem- solving interventions to address and correct the stressor making their life unbalanced. Understanding and applying boundaries will be explored and addressed for obtaining  and maintaining a balanced life. Patients will be encouraged to explore ways to assertively make their unbalanced needs known to significant others in their lives, using other group members and facilitator for support and feedback.  Therapeutic Goals: 1. Patient will identify two or more emotions or situations they have that consume much of in their lives. 2. Patient will identify signs/triggers that life has become out of balance:  3. Patient will identify two ways to set boundaries in order to achieve balance in their lives:  4. Patient will demonstrate ability to communicate their needs through discussion and/or role plays  Summary of Patient Progress: Pt shared that her family was out of balance in that they are "done with me".  Pt only responded to cSW questions and did not otherwise contribute to the group discussion.          Therapeutic Modalities:   Cognitive Behavioral Therapy Solution-Focused Therapy Assertiveness Training  Lurline Idol, [place]

## 2017-07-07 NOTE — Progress Notes (Signed)
[facility] MD Progress Note  07/07/2017 1:32 PM Autumn [name]  MRN:  [id number] Subjective:  Patient is a 41 year old separated Caucasian female from [place]. This patient carries a diagnosis of bipolar disorder type and substance abuse. She presented to the emergency department on the involuntary commitment for erratic behavior In the [place] parking lot, she was knocking on people's windows and running around the parking lot.  Patient was discharged from our unit on August 24 on Tegretol and lithium.  Urine toxicology was negative and her alcohol level was below detection limit. Patient denies engaging in substance abuse after discharge.  Pt's boyfriend reportedthat since discharge pt has not slept and only paced in the hallways during the weekend. SO states pt did wander off Saturday and was returned by [employer] early Sunday morning when [facility] was called. Patient has been telling him she is talking to that relatives and that she went to [facility] because she needed to talk to [place].  8/29 patient says that she was unable to sleep very well last night because she was trying to live up to her heroes. She was unable to explain what this meant. She appears very disheveled. Her thought process is somewhat disorganized. Denies major side effects. As far as physical complaints he complains of pain in her foot--- she said she hurt herself as she was walking  the [place] parking and pushing shopping cars for hours.hours  8/30 patient is very disheveled. Staff reports that she was interacting to internal stimuli and laughing inappropriately yesterday. During treatment team she made really bizarre statements such as "not sleeping well because she was living out to her heroes."  Today she tells me she is very nauseous. She appeared to be very nauseous during the interview was gaging   Per nursing:  Pt noted throughout the unit acting bizarre and laughing inappropriately. Denies SI, HI,  AVH. Pt reports here because she got into an argument with her boyfriend and he left her here. Pt showing some confusion at time.  Encouragement and support offered. Safety checks maintained. Medications given as prescribed. Pt receptive and remains safe on unit with q 15 min checks.  Principal Problem: Bipolar I disorder, most recent episode manic, severe without psychotic features ([place]) Diagnosis:   Patient Active Problem List   Diagnosis Date Noted  . Bipolar I disorder, most recent episode manic, severe without psychotic features ([place]) [F31.13] 07/05/2017  . Tobacco use disorder [F17.200] 06/27/2017  . 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 Opioid use disorder, severe, dependence ([place]) [F11.20] 06/27/2017  . Cannabis use disorder, severe, dependence ([place]) [F12.20] 06/27/2017  . Hx of glaucoma [Z86.69] 10/02/2013   Total Time spent with patient: 30 minutes  Past Psychiatric History: Patient has been diagnosed with schizoaffective disorder, bipolar disorder, borderline personality disorder, depression and generalized anxiety disorder. She is being hospitalized here in our unit a multitude of times. She also has been [facility] at least twice. Patient says she has attempted to walk in front of cars in the past. She does report history of self injury by cutting but has not engaged in this in years  She was just discharged from our unit on August 24. She'll return to the ER manic again on August 27.  Past Medical History:  Past Medical History:  Diagnosis Date  . Allergy    Seasonal, Ultram (seizures)  . Anemia 2005  . Bipolar 1 disorder ([place])   . Glaucoma   . Seizures (  [place]) 2008    Past Surgical History:  Procedure Laterality Date  . BREAST BIOPSY Right    x 2  . CESAREAN SECTION     x 2   Family History:  Family History  Problem Relation Age of Onset  . Hypertension Mother   . Hyperlipidemia Mother   . Parkinson's disease Father     Family Psychiatric  History: denies  Social History:  History  Alcohol Use No    Comment: Ocassional     History  Drug Use    Comment: percocet -last use over 1 week ago    Social History   Social History  . Marital status: Single    Spouse name: N/A  . Number of children: N/A  . Years of education: N/A   Social History Main Topics  . Smoking status: Current Every Day Smoker    Packs/day: 1.00    Years: 9.00  . Smokeless tobacco: Never Used     Comment: Patient not interested  . Alcohol use No     Comment: Ocassional  . Drug use: Yes     Comment: percocet -last use over 1 week ago  . Sexual activity: No   Other Topics Concern  . None   Social History Narrative  . None     Current Medications: Current Facility-Administered Medications  Medication Dose Route Frequency Provider Last Rate Last Dose  . alum & mag hydroxide-simeth (MAALOX/MYLANTA) 200-200-20 MG/5ML suspension 30 mL  30 mL Oral Q4H PRN [name], [name], MD      . Derrill Memo ON 07/08/2017] ARIPiprazole (ABILIFY) tablet 20 mg  20 mg Oral Daily Hernandez-Gonzalez, Seth Bake, MD      . ARIPiprazole (ABILIFY) tablet 5 mg  5 mg Oral Once Hildred Priest, MD      . benztropine (COGENTIN) tablet 0.5 mg  0.5 mg Oral Daily Hildred Priest, MD   0.5 mg at 07/07/17 0815  . carbamazepine (TEGRETOL) tablet 200 mg  200 mg Oral BID AC & HS [name], [name], MD   200 mg at 07/07/17 0816  . feeding supplement (ENSURE ENLIVE) (ENSURE ENLIVE) liquid 237 mL  237 mL Oral BID BM Hildred Priest, MD   237 mL at 07/07/17 1055  . lithium carbonate (ESKALITH) CR tablet 450 mg  450 mg Oral Q12H Hernandez-Gonzalez, Seth Bake, MD      . LORazepam (ATIVAN) tablet 2 mg  2 mg Oral QHS Hildred Priest, MD   2 mg at 07/06/17 2229  . magnesium hydroxide (MILK OF MAGNESIA) suspension 30 mL  30 mL Oral Daily PRN [name], [name], MD      . nicotine (NICODERM CQ - dosed in mg/24 hours) patch  21 mg  21 mg Transdermal Daily [name], [name], MD   21 mg at 07/07/17 0814  . ondansetron (ZOFRAN-ODT) disintegrating tablet 4 mg  4 mg Oral Once Hildred Priest, MD      . ondansetron (ZOFRAN-ODT) disintegrating tablet 4 mg  4 mg Oral Q8H PRN Hildred Priest, MD        Lab Results:  No results found for this or any previous visit (from the past 48 hour(s)).  Blood Alcohol level:  Lab Results  Component Value Date   ETH <5 07/04/2017   ETH 18 (H) 13/06/6577    Metabolic Disorder Labs: Lab Results  Component Value Date   HGBA1C 5.0 06/27/2017   MPG 96.8 06/27/2017   No results found for: PROLACTIN Lab Results  Component Value Date   CHOL  152 06/27/2017   TRIG 68 06/27/2017   HDL 59 06/27/2017   CHOLHDL 2.6 06/27/2017   VLDL 14 06/27/2017   LDLCALC 79 06/27/2017   LDLCALC 97 10/02/2013    Physical Findings: AIMS:  , ,  ,  ,    CIWA:    COWS:     Musculoskeletal: Strength & Muscle Tone: within normal limits Gait & Station: normal Patient leans: N/A  Psychiatric Specialty Exam: Physical Exam  Constitutional: She is oriented to person, place, and time. She appears well-developed and well-nourished.  HENT:  Head: Normocephalic and atraumatic.  Eyes: Conjunctivae and EOM are normal.  Neck: Normal range of motion.  Respiratory: Effort normal.  Musculoskeletal: Normal range of motion.  Neurological: She is alert and oriented to person, place, and time.    Review of Systems  Constitutional: Negative.   HENT: Negative.   Eyes: Negative.   Respiratory: Negative.   Cardiovascular: Negative.   Gastrointestinal: Negative.   Genitourinary: Negative.   Musculoskeletal: Negative.   Skin: Negative.   Neurological: Negative.   Endo/Heme/Allergies: Negative.   Psychiatric/Behavioral: Positive for depression. Negative for hallucinations, memory loss, substance abuse and suicidal ideas. The patient is not nervous/anxious and does not have  insomnia.     Blood pressure 98/70, pulse 88, temperature 98.8 F (37.1 C), temperature source Oral, resp. rate 18, height 5\' 1"  (1.549 m), weight 38.1 kg (84 lb), SpO2 99 %.Body mass index is 15.87 kg/m.  General Appearance: Disheveled  Eye Contact:  Fair  Speech:  Slow  Volume:  Decreased  Mood:  Dysphoric  Affect:  Blunt  Thought Process:  Linear and Descriptions of Associations: Loose  Orientation:  Full (Time, Place, and Person)  Thought Content:  Hallucinations: None  Suicidal Thoughts:  No  Homicidal Thoughts:  No  Memory:  Immediate;   Fair Recent;   Fair Remote;   Fair  Judgement:  Fair  Insight:  Fair  Psychomotor Activity:  Decreased  Concentration:  Concentration: Poor and Attention Span: Poor  Recall:  Poor  Fund of Knowledge:  Fair  Language:  Fair  Akathisia:  No  Handed:    AIMS (if indicated):     Assets:  Armed forces logistics/support/administrative officer Social Support  ADL's:  Intact  Cognition:  WNL  Sleep:  Number of Hours: 6.5     Treatment Plan Summary: Daily contact with patient to assess and evaluate symptoms and progress in treatment and Medication management  Bipolar disorder the patient will be continued on Tegretol 200 mg twice a day. Patient very now she hated today, he could be because she has been receiving immediate release lithium. I will change lithium to lithium CR 450 mg twice a day  Abilify will be increased to 20 mg  EPS: continue benztropine 0.5 mg po q day--prior dystonia with abilify  Insomnia: Continue Ativan 2 mg by mouth daily at bedtime  Amphetamines, opiates and marijuana use disorder patient will be referred to intensive outpatient substance abuse upon discharge--- urine toxicology was negative. No alcohol in her system  No evidence of withdrawals at this time  Tobacco use disorder: Continue nicotine patches 21 mg a day  Labs: Tegretol level was 6.8, her lithium level was 0.63.  As lithium dose has been increased she will need a lithium  level on Monday am  Dispo: back home with boyfriend  F/u: [facility]  Possible d/c early next week. Will need tegretol and lithium level prior to discharge  Hildred Priest, MD 07/07/2017, 1:32 PM

## 2017-07-08 MED ORDER — ARIPIPRAZOLE ER 400 MG IM SRER
400.0000 mg | INTRAMUSCULAR | Status: DC
  Filled 2017-07-08: qty 400

## 2017-07-08 MED ORDER — TEMAZEPAM 15 MG PO CAPS
30.0000 mg | ORAL_CAPSULE | Freq: Every day | ORAL | Status: DC
  Administered 2017-07-08 – 2017-07-21 (×13): 30 mg via ORAL
  Filled 2017-07-08 (×14): qty 2

## 2017-07-08 NOTE — Plan of Care (Signed)
Problem: Activity: Goal: Interest or engagement in leisure activities will improve Outcome: Progressing Pt seen interactive on unit with staff.

## 2017-07-08 NOTE — Progress Notes (Signed)
Pt refused Abilify injection stated, " If you give me that needle everyone in this hospital will have to hold me down". Will continue to monitor.

## 2017-07-08 NOTE — BHH Group Notes (Signed)
[facility] Group Notes:  (Nursing/MHT/Case Management/Adjunct)  Date:  07/08/2017  Time:  11:15 PM  Type of Therapy:  Evening Wrap-up Group  Participation Level:  Minimal  Participation Quality:  Attentive  Affect:  Flat  Cognitive:  Appropriate  Insight:  Improving  Engagement in Group:  Developing/Improving  Modes of Intervention:  Discussion  Summary of Progress/Problems:  Autumn [name] 07/08/2017, 11:15 PM

## 2017-07-08 NOTE — Progress Notes (Signed)
[facility] MD Progress Note  07/08/2017 9:43 AM Autumn [name]  MRN:  [id number] Subjective:   Autumn [name] is a 41 year old separated Caucasian female from [place]. This patient carries a diagnosis of bipolar disorder type and substance abuse. She presented to the emergency department on the involuntary commitment for erratic behavior In the [place] parking lot, she was knocking on people's windows and running around the parking lot.  8/29 patient says that she was unable to sleep very well last night because she was trying to live up to her heroes. She was unable to explain what this meant. She appears very disheveled. Her thought process is somewhat disorganized. Denies major side effects. As far as physical complaints he complains of pain in her foot--- she said she hurt herself as she was walking  the [place] parking and pushing shopping cars for hours.hours  8/30 patient is very disheveled. Staff reports that she was interacting to internal stimuli and laughing inappropriately yesterday. During treatment team she made really bizarre statements such as "not sleeping well because she was living out to her heroes." Today she tells me she is very nauseous. She appeared to be very nauseous during the interview was gaging .  07/08/2017. Autumn [name] reports much improvement. She denies hallucinations or paranoia but appears to attend to internal stimuli. She seems disorganized in her thinking able to answer simple questions only. She reports good sleep but slept less than 3 hours with Ativan. She tolerates medications well. She will consider Abilify maintena injection. Her boyfriend would not allow her back home if she does not accept treatment.   Per nursing:  Pt seems to be responding to internal stimuli at times. She denies SI and HI. She reports feeling nauseated zofran given. Pt visible on unit. She is compliant with medications. Will continue to monitor.   Principal Problem: Bipolar I  disorder, most recent episode manic, severe without psychotic features ([place]) Diagnosis:   Patient Active Problem List   Diagnosis Date Noted  . Bipolar I disorder, most recent episode manic, severe without psychotic features ([place]) [F31.13] 07/05/2017  . Tobacco use disorder [F17.200] 06/27/2017  . 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 Opioid use disorder, severe, dependence ([place]) [F11.20] 06/27/2017  . Cannabis use disorder, severe, dependence ([place]) [F12.20] 06/27/2017  . Hx of glaucoma [Z86.69] 10/02/2013   Total Time spent with patient: 30 minutes  Past Psychiatric History: Patient has been diagnosed with schizoaffective disorder, bipolar disorder, borderline personality disorder, depression and generalized anxiety disorder. She is being hospitalized here in our unit a multitude of times. She also has been [facility] at least twice. Patient says she has attempted to walk in front of cars in the past. She does report history of self injury by cutting but has not engaged in this in years  She was just discharged from our unit on August 24. She'll return to the ER manic again on August 27.  Past Medical History:  Past Medical History:  Diagnosis Date  . Allergy    Seasonal, Ultram (seizures)  . Anemia 2005  . Bipolar 1 disorder ([place])   . Glaucoma   . Seizures ([place]) 2008    Past Surgical History:  Procedure Laterality Date  . BREAST BIOPSY Right    x 2  . CESAREAN SECTION     x 2   Family History:  Family History  Problem Relation Age of Onset  . Hypertension Mother   . Hyperlipidemia Mother   .  Parkinson's disease Father    Family Psychiatric  History: denies  Social History:  History  Alcohol Use No    Comment: Ocassional     History  Drug Use    Comment: percocet -last use over 1 week ago    Social History   Social History  . Marital status: Single    Spouse name: N/A  . Number of children: N/A  . Years of education:  N/A   Social History Main Topics  . Smoking status: Current Every Day Smoker    Packs/day: 1.00    Years: 9.00  . Smokeless tobacco: Never Used     Comment: Patient not interested  . Alcohol use No     Comment: Ocassional  . Drug use: Yes     Comment: percocet -last use over 1 week ago  . Sexual activity: No   Other Topics Concern  . None   Social History Narrative  . None     Current Medications: Current Facility-Administered Medications  Medication Dose Route Frequency Provider Last Rate Last Dose  . alum & mag hydroxide-simeth (MAALOX/MYLANTA) 200-200-20 MG/5ML suspension 30 mL  30 mL Oral Q4H PRN Kennisha Qin B, MD      . ARIPiprazole (ABILIFY) tablet 20 mg  20 mg Oral Daily Hildred Priest, MD   20 mg at 07/08/17 0806  . benztropine (COGENTIN) tablet 0.5 mg  0.5 mg Oral Daily Hildred Priest, MD   0.5 mg at 07/08/17 0806  . carbamazepine (TEGRETOL) tablet 200 mg  200 mg Oral BID AC & HS Jagar Lua B, MD   200 mg at 07/08/17 0808  . feeding supplement (ENSURE ENLIVE) (ENSURE ENLIVE) liquid 237 mL  237 mL Oral BID BM Hildred Priest, MD   237 mL at 07/07/17 1348  . lithium carbonate (ESKALITH) CR tablet 450 mg  450 mg Oral Q12H Hildred Priest, MD   450 mg at 07/08/17 0807  . LORazepam (ATIVAN) tablet 2 mg  2 mg Oral QHS Hildred Priest, MD   2 mg at 07/07/17 2144  . magnesium hydroxide (MILK OF MAGNESIA) suspension 30 mL  30 mL Oral Daily PRN Alece Koppel B, MD      . nicotine (NICODERM CQ - dosed in mg/24 hours) patch 21 mg  21 mg Transdermal Daily Cuthbert Turton B, MD   21 mg at 07/08/17 0807  . ondansetron (ZOFRAN-ODT) disintegrating tablet 4 mg  4 mg Oral Q8H PRN Hildred Priest, MD        Lab Results:  No results found for this or any previous visit (from the past 48 hour(s)).  Blood Alcohol level:  Lab Results  Component Value Date   ETH <5 07/04/2017   ETH 18 (H)  38/08/1750    Metabolic Disorder Labs: Lab Results  Component Value Date   HGBA1C 5.0 06/27/2017   MPG 96.8 06/27/2017   No results found for: PROLACTIN Lab Results  Component Value Date   CHOL 152 06/27/2017   TRIG 68 06/27/2017   HDL 59 06/27/2017   CHOLHDL 2.6 06/27/2017   VLDL 14 06/27/2017   LDLCALC 79 06/27/2017   LDLCALC 97 10/02/2013    Physical Findings: AIMS:  , ,  ,  ,    CIWA:    COWS:     Musculoskeletal: Strength & Muscle Tone: within normal limits Gait & Station: normal Patient leans: N/A  Psychiatric Specialty Exam: Physical Exam  Nursing note and vitals reviewed. Musculoskeletal: Normal range of motion.  Psychiatric: Her speech  is normal and behavior is normal. Her affect is blunt. Thought content is paranoid and delusional. Cognition and memory are normal. She expresses impulsivity.    Review of Systems  Psychiatric/Behavioral: Positive for depression and substance abuse.  All other systems reviewed and are negative.   Blood pressure 104/68, pulse 83, temperature 98.2 F (36.8 C), temperature source Oral, resp. rate 18, height 5\' 1"  (1.549 m), weight 38.1 kg (84 lb), SpO2 99 %.Body mass index is 15.87 kg/m.  General Appearance: Disheveled  Eye Contact:  Fair  Speech:  Slow  Volume:  Decreased  Mood:  Dysphoric  Affect:  Blunt  Thought Process:  Linear and Descriptions of Associations: Loose  Orientation:  Full (Time, Place, and Person)  Thought Content:  Hallucinations: None  Suicidal Thoughts:  No  Homicidal Thoughts:  No  Memory:  Immediate;   Fair Recent;   Fair Remote;   Fair  Judgement:  Fair  Insight:  Fair  Psychomotor Activity:  Decreased  Concentration:  Concentration: Poor and Attention Span: Poor  Recall:  Poor  Fund of Knowledge:  Fair  Language:  Fair  Akathisia:  No  Handed:    AIMS (if indicated):     Assets:  Armed forces logistics/support/administrative officer Social Support  ADL's:  Intact  Cognition:  WNL  Sleep:  Number of Hours: 2.75      Treatment Plan Summary: Daily contact with patient to assess and evaluate symptoms and progress in treatment and Medication management   Ms. Riles is a 41 year old female with a history of bipolar admitted for a psychotic break.  1. Bipolar disorder. We continued Tegretol 200 mg twice a day and lithium to lithium CR 450 mg twice a day for mood stabilization and Abilify 20 mg for psychosis. Tegretol level was 6.8, her lithium level was 0.63. We will recheck Li on Monday as her dose was increased. We will offer Abilify maintena injection.  2. EPS. Continue benztropine 0.5 mg q day. There is a history of dystonia with abilify.  3. Insomnia. Restoril 30 mg.  4. Amphetamines, opiates and marijuana use . The patient will be referred to intensive outpatient substance abuse upon discharge.   5. Tobacco use disorder. Nicotine patch is available.   6. Metabolic syndrome monitoring. Lipid panel, TSH and HgbA1C were completed on 06/27/2017.  7. EKG. Sinus tachycardia with PVCs, QTc 466.  8. Pregnancy test is negative.  9. Disposition. She will be discharged to home with her boyfriend. She will follow up with [facility].   Orson Slick, MD 07/08/2017, 9:43 AM

## 2017-07-08 NOTE — BHH Group Notes (Signed)
[place] Group Notes:  (Nursing/MHT/Case Management/Adjunct)  Date:  07/08/2017  Time:  4:08 AM  Type of Therapy:  Psychoeducational Skills  Participation Level:  Active  Participation Quality:  Appropriate, Attentive and Sharing  Affect:  Appropriate  Cognitive:  Appropriate  Insight:  Appropriate and Good  Engagement in Group:  Engaged  Modes of Intervention:  Discussion, Socialization and Support  Summary of Progress/Problems:  [name] 07/08/2017, 4:08 AM

## 2017-07-08 NOTE — Progress Notes (Signed)
D:Pt denies SI/HI/AVH, affect is flat but brightens upon approach, Patient is alert and oriented x 4, thoughts are organized no bizarre behavior or hostility towards staff. Pt is pleasant and cooperative, sheappears less anxious, rated depression a 2/10, on a scale of ( 0- 10). Patient is interacting with peers and staff appropriately, patient's thoughts are coherent and logical.  A:Pt was offered support and encouragement. Pt was given scheduled medications. Pt was encouraged to attend groups. Q 15 minute checks were done for safety.  R:Pt attends groups and interacts well with peers and staff. Pt is taking medication. Pt has no complaints.Pt receptive to treatment and safety maintained on unit.

## 2017-07-08 NOTE — Progress Notes (Signed)
Recreation Therapy Notes  Date: 08.31.18 Time: 9:30 am Location: Craft Room  Group Topic: Coping Skills  Goal Area(s) Addresses:  Patient will verbalize benefit of using art as a coping skill. Patient will verbalize one emotion experienced in group.  Behavioral Response: Attentive, Interactive  Intervention: Coloring  Activity: Patients were given coloring sheets to color and were instructed to think about the emotions they were feeling as well as what their minds were focused on.  Education: LRT educated patients on healthy coping skills.   Education Outcome: Acknowledges education/In group clarification offered  Clinical Observations/Feedback: Patient colored coloring sheet. Patient contributed to group discussion by stating what emotion she felt in group.  Leonette Monarch, LRT/CTRS 07/08/2017 10:26 AM

## 2017-07-09 DIAGNOSIS — F3113 Bipolar disorder, current episode manic without psychotic features, severe: Secondary | ICD-10-CM

## 2017-07-09 NOTE — Progress Notes (Signed)
Patient was visible on the unit pacing the halls and sitting in dayroom. Patient was observed responding to internal stimuli, laughing to self and was caught eating syrup and ketchup together. Staff removed the items and locked up condiments. Patient is compliant with medications. Patient is alert and oriented x 4, breathing unlabored, and extremities x 4 within normal limits. Patient is calm and cooperative. Patient did not display any disruptive behavior.  Patient continues to be monitored on 15 minute safety checks. Will continue to monitor patient and notify MD of any changes.

## 2017-07-09 NOTE — Plan of Care (Signed)
Problem: Safety: Goal: Ability to remain free from injury will improve Outcome: Progressing Patient remains safe and without injury during hospitalization and on Q 15 minute observation. Will continue to monitor patient.

## 2017-07-09 NOTE — Progress Notes (Addendum)
[facility] MD Progress Note  07/09/2017 1:33 PM Autumn [name]  MRN:  [id number] Subjective:   Autumn [name] is a 41 year old separated Caucasian female from [place]. This patient carries a diagnosis of bipolar disorder type and substance abuse. She presented to the emergency department on the involuntary commitment for erratic behavior In the [place] parking lot, she was knocking on people's windows and running around the parking lot.  8/29 patient says that she was unable to sleep very well last night because she was trying to live up to her heroes. She was unable to explain what this meant. She appears very disheveled. Her thought process is somewhat disorganized. Denies major side effects. As far as physical complaints he complains of pain in her foot--- she said she hurt herself as she was walking  the [place] [facility] parking and pushing shopping cars for hours.hours  8/30 patient is very disheveled. Staff reports that she was interacting to internal stimuli and laughing inappropriately yesterday. During treatment team she made really bizarre statements such as "not sleeping well because she was living out to her heroes." Today she tells me she is very nauseous. She appeared to be very nauseous during the interview was gaging .  07/08/2017. Autumn [name] reports much improvement. She denies hallucinations or paranoia but appears to attend to internal stimuli. She seems disorganized in her thinking able to answer simple questions only. She reports good sleep but slept less than 3 hours with Ativan. She tolerates medications well. She will consider Abilify maintena injection. Her boyfriend would not allow her back home if she does not accept treatment.   07/09/17  -  "I desired to be punished for the bad stuff I've done. I had a nervous breakdown and and that's when I thought am supposed to be doing this in public. I couldn't sleep at all last night and dad's memory kept coming back. I was unable to sleep  because of my bad dreams and kept waking up". Medications are helping. Denies any thoughts of wanting to hurt herself. Denies any AH or VH. Is seen to be interactive with other patients in the dayroom. We also discussed about the abilify maintena injection, patient indicates that she does not like needles would not be for the injection.  Per nursing:  Pt seems to be responding to internal stimuli at times. She denies SI and HI. She reports feeling nauseated zofran given. Pt visible on unit. She is compliant with medications. Will continue to monitor.   Principal Problem: Bipolar I disorder, most recent episode manic, severe without psychotic features ([place]) Diagnosis:   Patient Active Problem List   Diagnosis Date Noted  . Bipolar I disorder, most recent episode manic, severe without psychotic features ([place]) [F31.13] 07/05/2017  . Tobacco use disorder [F17.200] 06/27/2017  . Amphetamine use disorder, severe ([place]) [F15.20] 06/27/2017  . Opioid use disorder, severe, dependence ([place]) [F11.20] 06/27/2017  . Cannabis use disorder, severe, dependence ([place]) [F12.20] 06/27/2017  . Hx of glaucoma [Z86.69] 10/02/2013   Total Time spent with patient: 30 minutes  Past Psychiatric History: Patient has been diagnosed with schizoaffective disorder, bipolar disorder, borderline personality disorder, depression and generalized anxiety disorder. She is being hospitalized here in our unit a multitude of times. She also has been [facility] at least twice. Patient says she has attempted to walk in front of cars in the past. She does report history of self injury by cutting but has not engaged in this in years  She was  just discharged from our unit on August 24. She returned to the ER manic again on August 27.  Past Medical History:  Past Medical History:  Diagnosis Date  . Allergy    Seasonal, Ultram (seizures)  . Anemia 2005  . Bipolar 1 disorder ([place])   . Glaucoma   . Seizures ([place]) 2008     Past Surgical History:  Procedure Laterality Date  . BREAST BIOPSY Right    x 2  . CESAREAN SECTION     x 2   Family History:  Family History  Problem Relation Age of Onset  . Hypertension Mother   . Hyperlipidemia Mother   . Parkinson's disease Father    Family Psychiatric  History: denies  Social History:  History  Alcohol Use No    Comment: Ocassional     History  Drug Use    Comment: percocet -last use over 1 week ago    Social History   Social History  . Marital status: Single    Spouse name: N/A  . Number of children: N/A  . Years of education: N/A   Social History Main Topics  . Smoking status: Current Every Day Smoker    Packs/day: 1.00    Years: 9.00  . Smokeless tobacco: Never Used     Comment: Patient not interested  . Alcohol use No     Comment: Ocassional  . Drug use: Yes     Comment: percocet -last use over 1 week ago  . Sexual activity: No   Other Topics Concern  . None   Social History Narrative  . None     Current Medications: Current Facility-Administered Medications  Medication Dose Route Frequency Provider Last Rate Last Dose  . alum & mag hydroxide-simeth (MAALOX/MYLANTA) 200-200-20 MG/5ML suspension 30 mL  30 mL Oral Q4H PRN [name], [name], MD      . ARIPiprazole (ABILIFY) tablet 20 mg  20 mg Oral Daily Hildred Priest, MD   20 mg at 07/09/17 2536  . ARIPiprazole ER SRER 400 mg  400 mg Intramuscular Q28 days [name], [name], MD      . benztropine (COGENTIN) tablet 0.5 mg  0.5 mg Oral Daily Hildred Priest, MD   0.5 mg at 07/09/17 0828  . carbamazepine (TEGRETOL) tablet 200 mg  200 mg Oral BID AC & HS [name], [name], MD   200 mg at 07/09/17 0828  . feeding supplement (ENSURE ENLIVE) (ENSURE ENLIVE) liquid 237 mL  237 mL Oral BID BM Hildred Priest, MD   237 mL at 07/09/17 1000  . lithium carbonate (ESKALITH) CR tablet 450 mg  450 mg Oral Q12H Hildred Priest, MD    450 mg at 07/09/17 6440  . magnesium hydroxide (MILK OF MAGNESIA) suspension 30 mL  30 mL Oral Daily PRN [name], [name], MD      . nicotine (NICODERM CQ - dosed in mg/24 hours) patch 21 mg  21 mg Transdermal Daily [name], [name], MD   21 mg at 07/09/17 0829  . ondansetron (ZOFRAN-ODT) disintegrating tablet 4 mg  4 mg Oral Q8H PRN Hildred Priest, MD      . temazepam (RESTORIL) capsule 30 mg  30 mg Oral QHS [name], [name], MD   30 mg at 07/08/17 2137    Lab Results:  No results found for this or any previous visit (from the past 48 hour(s)).  Blood Alcohol level:  Lab Results  Component Value Date   [facility] <5 07/04/2017  ETH 18 (H) 59/74/1638    Metabolic Disorder Labs: Lab Results  Component Value Date   HGBA1C 5.0 06/27/2017   MPG 96.8 06/27/2017   No results found for: PROLACTIN Lab Results  Component Value Date   CHOL 152 06/27/2017   TRIG 68 06/27/2017   HDL 59 06/27/2017   CHOLHDL 2.6 06/27/2017   VLDL 14 06/27/2017   LDLCALC 79 06/27/2017   LDLCALC 97 10/02/2013    Physical Findings: AIMS:  , ,  ,  ,    CIWA:    COWS:     Musculoskeletal: Strength & Muscle Tone: within normal limits Gait & Station: normal Patient leans: N/A  Psychiatric Specialty Exam: Physical Exam  Nursing note and vitals reviewed. Musculoskeletal: Normal range of motion.  Psychiatric: Her speech is normal and behavior is normal. Her affect is blunt. Thought content is paranoid and delusional. Cognition and memory are normal. She expresses impulsivity.    Review of Systems  Psychiatric/Behavioral: Positive for depression and substance abuse.  All other systems reviewed and are negative.   Blood pressure 110/71, pulse 82, temperature 98.5 F (36.9 C), temperature source Oral, resp. rate 18, height 5\' 1"  (1.549 m), weight 84 lb (38.1 kg), SpO2 99 %.Body mass index is 15.87 kg/m.  General Appearance: Disheveled  Eye Contact:  Fair  Speech:  Slow  Volume:   Decreased  Mood:  Dysphoric  Affect:  Blunt  Thought Process:  Linear and Descriptions of Associations: Loose  Orientation:  Full (Time, Place, and Person)  Thought Content:  Hallucinations: None  Suicidal Thoughts:  No  Homicidal Thoughts:  No  Memory:  Immediate;   Fair Recent;   Fair Remote;   Fair  Judgement:  Fair  Insight:  Fair  Psychomotor Activity:  Decreased  Concentration:  Concentration: Poor and Attention Span: Poor  Recall:  Poor  Fund of Knowledge:  Fair  Language:  Fair  Akathisia:  No  Handed:    AIMS (if indicated):     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  1. Bipolar disorder. We continued Tegretol 200 mg twice a day and changed lithium to lithium CR 450 mg twice a day for mood stabilization and Abilify 20 mg for psychosis. Tegretol level was 6.8, her lithium level was 0.63. We will recheck Li on Monday as her dose was increased. Abilify maintena injection can be offered to her before discharge.   2. EPS. Continue benztropine 0.5 mg q day. There is a history of dystonia with abilify.  3. Insomnia. Restoril 30 mg.  4. Amphetamines, opiates and marijuana use . The patient will be referred to intensive outpatient substance abuse upon discharge.   5. Tobacco use disorder. Nicotine patch is available.   6. Metabolic syndrome monitoring. Lipid panel, TSH and HgbA1C were completed on 06/27/2017.  7. EKG. Sinus tachycardia with PVCs, QTc 466.  8. Pregnancy test is negative.  9. Disposition. She will be discharged to home with her boyfriend. She will follow up with [facility].   Ramond Dial, MD 07/09/2017, 1:33 PM

## 2017-07-09 NOTE — BHH Group Notes (Signed)
[place] Group Notes:  (Nursing/MHT/Case Management/Adjunct)  Date:  07/09/2017  Time:  9:46 PM  Type of Therapy:  Group Therapy  Participation Level:  Active  Participation Quality:  Appropriate  Affect:  Appropriate  Cognitive:  Appropriate  Insight:  Good  Engagement in Group:  Supportive  Modes of Intervention:  Support  Summary of Progress/Problems:  Autumn [name] 07/09/2017, 9:46 PM

## 2017-07-09 NOTE — Plan of Care (Signed)
Problem: Education: Goal: Utilization of techniques to improve thought processes will improve Outcome: Not Progressing Patient was pleasant on contact and seems to benefit from staff attention but remains disorganized when writer attempted to help her focus on positive aspects of her life( what she likes about her job, who is a family member she feels close to).  She was unable to engage in a conversation without focusing on "all the things I have done wrong" and "why people don't want me around".  She paced from her room to the dayroom most of the evening.  She verbalized "being afraid to sleep because I might have more nightmares".  She described her nightmares as being about "the apocalypse and all the bad things in the news".    Problem: Activity: Goal: Sleeping patterns will improve Outcome: Progressing Patient was encouraged to be active this evening and utilize dayroom activities prior to bedtime.  She agreed and did follow through.  She was given Restoril at 2230 and instructed to turn off the lights by 2300.  She stated she was afraid of nightmares but seemed relieved when reminded that we check every 15 minutes and are available for her.

## 2017-07-09 NOTE — [facility] Group Notes (Signed)
[facility] LCSW Group Therapy  07/09/2017 3:10 PM  Type of Therapy:  Group Therapy  Participation Level:  Minimal  Participation Quality:  Inattentive  Affect:  Labile  Cognitive:  Hallucinating  Insight:  None  Engagement in Therapy:  None  Modes of Intervention:  Activity, Clarification, Discussion, Education and Support  Summary of Progress/Problems: Goal Setting: The objective is to set goals as they relate to the crisis in which they were admitted. Patients will be using SMART goal modalities to set measurable goals. Characteristics of realistic goals will be discussed and patients will be assisted in setting and processing how one will reach their goal. Facilitator will also assist patients in applying interventions and coping skills learned in psycho-education groups to the SMART goal and process how one will achieve defined goal. Patient was unable to set goal at this time. Patient was responding to internal stimuli and laughing out loud throughout group at random time.  Lima Chillemi G. [place], [place] 07/09/2017, 3:10 PM

## 2017-07-09 NOTE — Plan of Care (Signed)
Problem: Safety: Goal: Ability to disclose and discuss suicidal ideas will improve Outcome: Progressing Patient denies current SI and contracts for safety.  She focused on medication issues and demonstrated understanding medication education.  Problem: Activity: Goal: Sleeping patterns will improve Outcome: Progressing Patient asked questions about medications ordered for the evening.  She was given information and acknowledged understanding.

## 2017-07-10 DIAGNOSIS — F3113 Bipolar disorder, current episode manic without psychotic features, severe: Secondary | ICD-10-CM

## 2017-07-10 NOTE — [facility] Group Notes (Signed)
[facility] Group Notes:  (Nursing/MHT/Case Management/Adjunct)  Date:  07/10/2017  Time:  9:51 PM  Type of Therapy:  Evening Wrap-up Group  Participation Level:  Active  Participation Quality:  Appropriate and Attentive  Affect:  Appropriate  Cognitive:  Alert and Appropriate  Insight:  Appropriate and Improving  Engagement in Group:  Developing/Improving and Engaged  Modes of Intervention:  Discussion  Summary of Progress/Problems:  Autumn [name] 07/10/2017, 9:51 PM

## 2017-07-10 NOTE — Progress Notes (Signed)
[facility] MD Progress Note  07/10/2017 1:08 PM Autumn [name]  MRN:  [id number] Subjective:   Autumn [name] is a 41 year old separated Caucasian female from [place]. This patient carries a diagnosis of bipolar disorder type and substance abuse. She presented to the emergency department on the involuntary commitment for erratic behavior In the [place] parking lot, she was knocking on people's windows and running around the parking lot.  8/29 patient says that she was unable to sleep very well last night because she was trying to live up to her heroes. She was unable to explain what this meant. She appears very disheveled. Her thought process is somewhat disorganized. Denies major side effects. As far as physical complaints he complains of pain in her foot--- she said she hurt herself as she was walking  the [place] parking and pushing shopping cars for hours.hours  8/30 patient is very disheveled. Staff reports that she was interacting to internal stimuli and laughing inappropriately yesterday. During treatment team she made really bizarre statements such as "not sleeping well because she was living out to her heroes." Today she tells me she is very nauseous. She appeared to be very nauseous during the interview was gaging .  07/08/2017. Autumn [name] reports much improvement. She denies hallucinations or paranoia but appears to attend to internal stimuli. She seems disorganized in her thinking able to answer simple questions only. She reports good sleep but slept less than 3 hours with Ativan. She tolerates medications well. She will consider Abilify maintena injection. Her boyfriend would not allow her back home if she does not accept treatment.   07/09/17  -  "I desired to be punished for the bad stuff I've done. I had a nervous breakdown and and that's when I thought am supposed to be doing this in public. I couldn't sleep at all last night and dad's memory kept coming back. I was unable to sleep  because of my bad dreams and kept waking up". Medications are helping. Denies any thoughts of wanting to hurt herself. Denies any AH or VH. Is seen to be interactive with other patients in the dayroom. We also discussed about the abilify maintena injection, patient indicates that she does not like needles, would not be for the injection.  07/10/17 - seen to be interacting with other patients on the unit. Endorses that she went to bed hungry last night and could not sleep at all. Denies any side effects with her medications, denies any auditory or visual hallucinations.  Per nursing:  Pt seems to be responding to internal stimuli at times. She denies SI and HI. She reports feeling nauseated zofran given. Pt visible on unit. She is compliant with medications. Will continue to monitor.   Principal Problem: Bipolar I disorder, most recent episode manic, severe without psychotic features ([place]) Diagnosis:   Patient Active Problem List   Diagnosis Date Noted  . Bipolar I disorder, most recent episode manic, severe without psychotic features ([place]) [F31.13] 07/05/2017  . Tobacco use disorder [F17.200] 06/27/2017  . 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 Opioid use disorder, severe, dependence ([place]) [F11.20] 06/27/2017  . Cannabis use disorder, severe, dependence ([place]) [F12.20] 06/27/2017  . Hx of glaucoma [Z86.69] 10/02/2013   Total Time spent with patient: 30 minutes  Past Psychiatric History: Patient has been diagnosed with schizoaffective disorder, bipolar disorder, borderline personality disorder, depression and generalized anxiety disorder. She is being hospitalized here in our unit a multitude of times. She also has  been [facility] at least twice. Patient says she has attempted to walk in front of cars in the past. She does report history of self injury by cutting but has not engaged in this in years  She was just discharged from our unit on August 24. She  returned to the ER manic again on August 27.  Past Medical History:  Past Medical History:  Diagnosis Date  . Allergy    Seasonal, Ultram (seizures)  . Anemia 2005  . Bipolar 1 disorder ([place])   . Glaucoma   . Seizures ([place]) 2008    Past Surgical History:  Procedure Laterality Date  . BREAST BIOPSY Right    x 2  . CESAREAN SECTION     x 2   Family History:  Family History  Problem Relation Age of Onset  . Hypertension Mother   . Hyperlipidemia Mother   . Parkinson's disease Father    Family Psychiatric  History: denies  Social History:  History  Alcohol Use No    Comment: Ocassional     History  Drug Use    Comment: percocet -last use over 1 week ago    Social History   Social History  . Marital status: Single    Spouse name: N/A  . Number of children: N/A  . Years of education: N/A   Social History Main Topics  . Smoking status: Current Every Day Smoker    Packs/day: 1.00    Years: 9.00  . Smokeless tobacco: Never Used     Comment: Patient not interested  . Alcohol use No     Comment: Ocassional  . Drug use: Yes     Comment: percocet -last use over 1 week ago  . Sexual activity: No   Other Topics Concern  . None   Social History Narrative  . None     Current Medications: Current Facility-Administered Medications  Medication Dose Route Frequency Provider Last Rate Last Dose  . alum & mag hydroxide-simeth (MAALOX/MYLANTA) 200-200-20 MG/5ML suspension 30 mL  30 mL Oral Q4H PRN [name], [name], MD      . ARIPiprazole (ABILIFY) tablet 20 mg  20 mg Oral Daily Hildred Priest, MD   20 mg at 07/10/17 0816  . ARIPiprazole ER SRER 400 mg  400 mg Intramuscular Q28 days [name], [name], MD      . benztropine (COGENTIN) tablet 0.5 mg  0.5 mg Oral Daily Hildred Priest, MD   0.5 mg at 07/10/17 0816  . carbamazepine (TEGRETOL) tablet 200 mg  200 mg Oral BID AC & HS [name], [name], MD   200 mg at 07/10/17 0816  .  feeding supplement (ENSURE ENLIVE) (ENSURE ENLIVE) liquid 237 mL  237 mL Oral BID BM Hildred Priest, MD   237 mL at 07/10/17 1000  . lithium carbonate (ESKALITH) CR tablet 450 mg  450 mg Oral Q12H Hildred Priest, MD   450 mg at 07/10/17 0816  . magnesium hydroxide (MILK OF MAGNESIA) suspension 30 mL  30 mL Oral Daily PRN [name], [name], MD      . nicotine (NICODERM CQ - dosed in mg/24 hours) patch 21 mg  21 mg Transdermal Daily [name], [name], MD   21 mg at 07/10/17 0818  . ondansetron (ZOFRAN-ODT) disintegrating tablet 4 mg  4 mg Oral Q8H PRN Hildred Priest, MD      . temazepam (RESTORIL) capsule 30 mg  30 mg Oral QHS [name], [name], MD   30 mg  at 07/09/17 2227    Lab Results:  No results found for this or any previous visit (from the past 48 hour(s)).  Blood Alcohol level:  Lab Results  Component Value Date   ETH <5 07/04/2017   ETH 18 (H) 05/03/9484    Metabolic Disorder Labs: Lab Results  Component Value Date   HGBA1C 5.0 06/27/2017   MPG 96.8 06/27/2017   No results found for: PROLACTIN Lab Results  Component Value Date   CHOL 152 06/27/2017   TRIG 68 06/27/2017   HDL 59 06/27/2017   CHOLHDL 2.6 06/27/2017   VLDL 14 06/27/2017   LDLCALC 79 06/27/2017   LDLCALC 97 10/02/2013    Physical Findings: AIMS:  , ,  ,  ,    CIWA:    COWS:     Musculoskeletal: Strength & Muscle Tone: within normal limits Gait & Station: normal Patient leans: N/A  Psychiatric Specialty Exam: Physical Exam  Nursing note and vitals reviewed. Musculoskeletal: Normal range of motion.  Psychiatric: Her speech is normal and behavior is normal. Her affect is blunt. Thought content is paranoid and delusional. Cognition and memory are normal. She expresses impulsivity.    Review of Systems  Psychiatric/Behavioral: Positive for depression and substance abuse.  All other systems reviewed and are negative.   Blood pressure 110/70, pulse  81, temperature 98.5 F (36.9 C), temperature source Oral, resp. rate 18, height 5\' 1"  (1.549 m), weight 84 lb (38.1 kg), SpO2 99 %.Body mass index is 15.87 kg/m.  General Appearance: Disheveled  Eye Contact:  Fair  Speech:  Slow  Volume:  Decreased  Mood:  Dysphoric  Affect:  Blunt  Thought Process:  Linear and Descriptions of Associations: Loose  Orientation:  Full (Time, Place, and Person)  Thought Content:  Hallucinations: None  Suicidal Thoughts:  No  Homicidal Thoughts:  No  Memory:  Immediate;   Fair Recent;   Fair Remote;   Fair  Judgement:  Fair  Insight:  Fair  Psychomotor Activity:  Decreased  Concentration:  Concentration: Poor and Attention Span: Poor  Recall:  Poor  Fund of Knowledge:  Fair  Language:  Fair  Akathisia:  No  Handed:    AIMS (if indicated):     Assets:  Armed forces logistics/support/administrative officer Social Support  ADL's:  Intact  Cognition:  WNL  Sleep:  Number of Hours: 1.25     Treatment Plan Summary: Daily contact with patient to assess and evaluate symptoms and progress in treatment and Medication management   Ms. Mcdevitt is a 41 year old female with a history of bipolar disorder admitted for a psychotic break. She has been given various diagnoses, currently has a complicated pharmacological regimen. Overall she seems to be improving or at least holding steady without worsening. Denies any psychotic symptoms or affective symptoms currently.  1. Bipolar disorder. We continued Tegretol 200 mg twice a day and changed lithium to lithium CR 450 mg twice a day for mood stabilization and Abilify 20 mg for psychosis. Tegretol level was 6.8, her lithium level was 0.63 (07/05/17). Dose increased to 450 mg every 12 hours. This change was done on 07/07/2017, do for lithium level (07/12/17). Would not like the Abilify maintenance injection.  2. EPS. Continue benztropine 0.5 mg q day. There is a history of dystonia with abilify.  3. Insomnia. Restoril 30 mg.  4. Amphetamines,  opiates and marijuana use . The patient will be referred to intensive outpatient substance abuse upon discharge.   5. Tobacco use disorder. Nicotine  patch is available.   6. Metabolic syndrome monitoring. Lipid panel, TSH and HgbA1C were completed on 06/27/2017.  7. EKG. Sinus tachycardia with PVCs, QTc 466.  8. Pregnancy test is negative.  9. Disposition. She will be discharged to home with her boyfriend. She will follow up with [facility].   Ramond Dial, MD 07/10/2017, 1:08 PM

## 2017-07-10 NOTE — Progress Notes (Signed)
Patient had Restoril at 2300 and went to bed at 2300.  She only slept 1.30 last night and was up walking in the hall or trying to use the telephones throughout the night.  At one point, she got flat on the floor and started doing push ups before staff redirected her.  Her speech was clear and logical but she seemed disoriented to time.  She was up and out of her room 6-7 times throughout the night.  Her focus was calling her sister and "some friends".  She also carried her clothes in a paper bag as if she was preparing for discharge today.

## 2017-07-10 NOTE — [facility] Group Notes (Signed)
[facility] LCSW Group Therapy  07/10/2017 11:07 AM  Type of Therapy:  Group Therapy  Participation Level:  Active  Participation Quality:  Attentive  Affect:  Flat  Cognitive:  Disorganized  Insight:  Limited  Engagement in Therapy:  Limited  Modes of Intervention:  Activity, Discussion, Education, Socialization and Support  Summary of Progress/Problems: Balance in life: Patients will discuss the concept of balance and how it looks and feels to be unbalanced. Pt will identify areas in their life that is unbalanced and ways to become more balanced. Pt attended group and stayed the entire time. She states she needs to stop listening to other people. "My friends dare me to do all kinds of crazy stuff."   [place] MSW, LCSW  07/10/2017, 11:07 AM

## 2017-07-10 NOTE — Plan of Care (Signed)
Problem: Activity: Goal: Interest or engagement in leisure activities will improve Outcome: Progressing Patient was observed in her bed wrapped in a bottom sheet in a dark room.  She was awake and denied having been asleep.  She stated, "I threw away my pillow because I have Mono and I don't want anyone to catch it".  She agreed to make her bed if linens were brought to her.  We discussed sleep hygiene and she agreed to take her Restoril at 2300, when ready for bed.  When writer approached at 2300, she was observed sleeping.  Problem: Education: Goal: Utilization of techniques to improve thought processes will improve Outcome: Progressing Patient was encouraged to be active this evening to improve her chances of sleeping.  She attended wrap up group and had snack with the group.  She was observed laughing during wrap up meeting.  She denies hearing voices but content remains disorganized.

## 2017-07-10 NOTE — Plan of Care (Signed)
Problem: Activity: Goal: Sleeping patterns will improve Outcome: Not Progressing Patient sleeping patterns have not improve since admission and has not slept in 48 hours. MD has been notified of patient sleeping patterns. Will continue to monitor.  Problem: Safety: Goal: Ability to remain free from injury will improve Outcome: Progressing Patient remains safe and without injury during hospitalization and on Q 15 minute observation. Will continue to monitor patient.

## 2017-07-10 NOTE — Progress Notes (Signed)
Patient was visible on the unit pacing the hallways. Patient appears to be displaying manic behaviors walking up and down unit, grabbing all her belongings in bag and carrying around with her, thinking she is able to leave. Patient requires several redirection. Patient can not stay on one topic long before changing to another topic. Patient is compliant with medications and meals. Patient has not been asleep for almost 48 hrs and MD was made aware. Patient is alert and oriented x 4, breathing unlabored, and extremities x 4 within normal limits. Patient is calm and cooperative. Patient did not display any disruptive behavior. Patient continues to be monitored on 15 minute safety checks. Will continue to monitor patient and notify MD of any changes.

## 2017-07-11 DIAGNOSIS — F3113 Bipolar disorder, current episode manic without psychotic features, severe: Secondary | ICD-10-CM

## 2017-07-11 MED ORDER — ARIPIPRAZOLE 10 MG PO TABS
20.0000 mg | ORAL_TABLET | Freq: Once | ORAL | Status: AC
  Administered 2017-07-11: 20 mg via ORAL
  Filled 2017-07-11: qty 2

## 2017-07-11 MED ORDER — ARIPIPRAZOLE 10 MG PO TABS: 10.0000 mg | ORAL_TABLET | Freq: Once | ORAL | Status: DC

## 2017-07-11 MED ORDER — ARIPIPRAZOLE 10 MG PO TABS
30.0000 mg | ORAL_TABLET | Freq: Every day | ORAL | Status: DC
  Administered 2017-07-12 – 2017-07-16 (×5): 30 mg via ORAL
  Filled 2017-07-11: qty 3
  Filled 2017-07-11 (×3): qty 6
  Filled 2017-07-11: qty 3
  Filled 2017-07-11: qty 6

## 2017-07-11 MED ORDER — LORAZEPAM 2 MG PO TABS
2.0000 mg | ORAL_TABLET | ORAL | Status: DC | PRN
  Administered 2017-07-11 – 2017-07-14 (×4): 2 mg via ORAL
  Filled 2017-07-11 (×4): qty 1

## 2017-07-11 MED ORDER — ARIPIPRAZOLE 10 MG PO TABS: 20.0000 mg | ORAL_TABLET | Freq: Once | ORAL | Status: DC

## 2017-07-11 NOTE — Progress Notes (Signed)
Patient was observed asleep in the bed at 2300 when approached for Restoril administration.  She remained asleep until 0300 at which time she was seen hurrying down the hall carrying a paper bag full of papers toward the exit door.  When approached, she followed direction to return to her room.  She was up in the hall about every 30 minutes either checking the exit doors or exercising on the floor.  She was joined in walking and given some suggestions for how to relax.  She stated she was having a panic attack and "needed to go out to smoke".  She was disorganized in behavior and had loose associations while talking about boyfriend and needing to leave.  She did follow direction to exercise in her room and not in the hallway.  At some point while alone, she damaged the curtain in her room (one end is loose).

## 2017-07-11 NOTE — Plan of Care (Signed)
Problem: Education: Goal: Utilization of techniques to improve thought processes will improve Outcome: Not Progressing Patient manic, attempting to elope

## 2017-07-11 NOTE — Progress Notes (Signed)
[facility] MD Progress Note  07/11/2017 1:22 PM Autumn [name]  MRN:  [id number] Subjective:   Autumn [name] is a 41 year old separated Caucasian female from [place]. This Autumn [name] carries a diagnosis of bipolar disorder type and substance abuse. She presented to the emergency department on the involuntary commitment for erratic behavior In the [place] parking lot, she was knocking on people's windows and running around the parking lot.  8/29 Autumn [name] says that she was unable to sleep very well last night because she was trying to live up to her heroes. She was unable to explain what this meant. She appears very disheveled. Her thought process is somewhat disorganized. Denies major side effects. As far as physical complaints he complains of pain in her foot--- she said she hurt herself as she was walking  the [employer] parking and pushing shopping cars for hours.hours  8/30 Autumn [name] is very disheveled. Staff reports that she was interacting to internal stimuli and laughing inappropriately yesterday. During treatment team she made really bizarre statements such as "not sleeping well because she was living out to her heroes." Today she tells me she is very nauseous. She appeared to be very nauseous during the interview was gaging .  07/08/2017. Autumn [name] reports much improvement. She denies hallucinations or paranoia but appears to attend to internal stimuli. She seems disorganized in her thinking able to answer simple questions only. She reports good sleep but slept less than 3 hours with Ativan. She tolerates medications well. She will consider Abilify maintena injection. Her boyfriend would not allow her back home if she does not accept treatment.   07/09/17  -  "I desired to be punished for the bad stuff I've done. I had a nervous breakdown and and that's when I thought am supposed to be doing this in public. I couldn't sleep at all last night and dad's memory kept coming back. I was unable to sleep  because of my bad dreams and kept waking up". Medications are helping. Denies any thoughts of wanting to hurt herself. Denies any AH or VH. Is seen to be interactive with other patients in the dayroom. We also discussed about the abilify maintena injection, Autumn [name] indicates that she does not like needles, would not be for the injection.  07/10/17 - seen to be interacting with other patients on the unit. Endorses that she went to bed hungry last night and could not sleep at all. Denies any side effects with her medications, denies any auditory or visual hallucinations.  07/11/17 - Autumn [name] is seen to be pacing around on the unit, last night there was an instance wherein she couldn't sleep and was out of her room at 3 AM in the morning. Today nursing staff found her on her way to exit out of the unit. During my interview she tells me that an apocalypse is coming to [place] and she is getting very anxious about this. Feels that something bad is going to happen to everybody she knows. Is restless and fidgety when she talks, endorses that she could not sleep last night because of this anxiety.  Per nursing:  Pt seems to be responding to internal stimuli at times. She denies SI and HI. She reports feeling nauseated zofran given. Pt visible on unit. She is compliant with medications. Will continue to monitor.   Principal Problem: Bipolar I disorder, most recent episode manic, severe without psychotic features ([place]) Diagnosis:   Autumn [name] Active Problem List   Diagnosis Date Noted  . Bipolar  I disorder, most recent episode manic, severe without psychotic features ([place]) [F31.13] 07/05/2017  . Tobacco use disorder [F17.200] 06/27/2017  . 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 Opioid use disorder, severe, dependence ([place]) [F11.20] 06/27/2017  . Cannabis use disorder, severe, dependence ([place]) [F12.20] 06/27/2017  . Hx of glaucoma [Z86.69] 10/02/2013   Total Time spent with Autumn [name]: 30  minutes  Past Psychiatric History: Autumn [name] has been diagnosed with schizoaffective disorder, bipolar disorder, borderline personality disorder, depression and generalized anxiety disorder. She is being hospitalized here in our unit a multitude of times. She also has been [facility] at least twice. Autumn [name] says she has attempted to walk in front of cars in the past. She does report history of self injury by cutting but has not engaged in this in years  She was just discharged from our unit on August 24. She returned to the ER manic again on August 27.  Past Medical History:  Past Medical History:  Diagnosis Date  . Allergy    Seasonal, Ultram (seizures)  . Anemia 2005  . Bipolar 1 disorder ([place])   . Glaucoma   . Seizures ([place]) 2008    Past Surgical History:  Procedure Laterality Date  . BREAST BIOPSY Right    x 2  . CESAREAN SECTION     x 2   Family History:  Family History  Problem Relation Age of Onset  . Hypertension Mother   . Hyperlipidemia Mother   . Parkinson's disease Father    Family Psychiatric  History: denies  Social History:  History  Alcohol Use No    Comment: Ocassional     History  Drug Use    Comment: percocet -last use over 1 week ago    Social History   Social History  . Marital status: Single    Spouse name: N/A  . Number of children: N/A  . Years of education: N/A   Social History Main Topics  . Smoking status: Current Every Day Smoker    Packs/day: 1.00    Years: 9.00  . Smokeless tobacco: Never Used     Comment: Autumn [name] not interested  . Alcohol use No     Comment: Ocassional  . Drug use: Yes     Comment: percocet -last use over 1 week ago  . Sexual activity: No   Other Topics Concern  . None   Social History Narrative  . None     Current Medications: Current Facility-Administered Medications  Medication Dose Route Frequency Provider Last Rate Last Dose  . alum & mag hydroxide-simeth (MAALOX/MYLANTA)  200-200-20 MG/5ML suspension 30 mL  30 mL Oral Q4H PRN [name], [name], MD      . Derrill Memo ON 07/12/2017] ARIPiprazole (ABILIFY) tablet 30 mg  30 mg Oral Daily Ramond Dial, MD      . ARIPiprazole ER SRER 400 mg  400 mg Intramuscular Q28 days [name], [name], MD      . benztropine (COGENTIN) tablet 0.5 mg  0.5 mg Oral Daily Hildred Priest, MD   0.5 mg at 07/11/17 0800  . carbamazepine (TEGRETOL) tablet 200 mg  200 mg Oral BID AC & HS [name], [name], MD   200 mg at 07/11/17 0802  . feeding supplement (ENSURE ENLIVE) (ENSURE ENLIVE) liquid 237 mL  237 mL Oral BID BM Hildred Priest, MD   237 mL at 07/11/17 1000  . lithium carbonate (ESKALITH) CR tablet 450 mg  450 mg Oral Q12H Hildred Priest, MD  450 mg at 07/11/17 0800  . magnesium hydroxide (MILK OF MAGNESIA) suspension 30 mL  30 mL Oral Daily PRN [name], [name], MD      . nicotine (NICODERM CQ - dosed in mg/24 hours) patch 21 mg  21 mg Transdermal Daily [name], [name], MD   21 mg at 07/11/17 0801  . ondansetron (ZOFRAN-ODT) disintegrating tablet 4 mg  4 mg Oral Q8H PRN Hildred Priest, MD      . temazepam (RESTORIL) capsule 30 mg  30 mg Oral QHS [name], [name], MD   30 mg at 07/09/17 2227    Lab Results:  No results found for this or any previous visit (from the past 48 hour(s)).  Blood Alcohol level:  Lab Results  Component Value Date   ETH <5 07/04/2017   ETH 18 (H) 34/19/6222    Metabolic Disorder Labs: Lab Results  Component Value Date   HGBA1C 5.0 06/27/2017   MPG 96.8 06/27/2017   No results found for: PROLACTIN Lab Results  Component Value Date   CHOL 152 06/27/2017   TRIG 68 06/27/2017   HDL 59 06/27/2017   CHOLHDL 2.6 06/27/2017   VLDL 14 06/27/2017   LDLCALC 79 06/27/2017   LDLCALC 97 10/02/2013    Physical Findings: AIMS:  , ,  ,  ,    CIWA:    COWS:     Musculoskeletal: Strength & Muscle Tone: within normal limits Gait  & Station: normal Autumn [name] leans: N/A  Psychiatric Specialty Exam: Physical Exam  Nursing note and vitals reviewed. Musculoskeletal: Normal range of motion.  Psychiatric: Her speech is normal and behavior is normal. Her affect is blunt. Thought content is paranoid and delusional. Cognition and memory are normal. She expresses impulsivity.    Review of Systems  Psychiatric/Behavioral: Positive for depression and substance abuse.  All other systems reviewed and are negative.   Blood pressure 120/79, pulse 84, temperature 98.2 F (36.8 C), temperature source Oral, resp. rate 18, height 5\' 1"  (1.549 m), weight 84 lb (38.1 kg), SpO2 99 %.Body mass index is 15.87 kg/m.  General Appearance: Disheveled  Eye Contact:  Fair  Speech:  Slow  Volume:  Decreased  Mood:  Dysphoric  Affect: labile  Thought Process:  Linear and Descriptions of Associations: Loose  Orientation:  Full (Time, Place, and Person)  Thought Content:  Hallucinations: None  Suicidal Thoughts:  No  Homicidal Thoughts:  No  Memory:  Immediate;   Fair Recent;   Fair Remote;   Fair  Judgement:  Fair  Insight:  Fair  Psychomotor Activity:  Decreased  Concentration:  Concentration: Poor and Attention Span: Poor  Recall:  Poor  Fund of Knowledge:  Fair  Language:  Fair  Akathisia:  No  Handed:    AIMS (if indicated):     Assets:  Armed forces logistics/support/administrative officer Social Support  ADL's:  Intact  Cognition:  WNL  Sleep:  Number of Hours: 6.15     Treatment Plan Summary: Daily contact with Autumn [name] to assess and evaluate symptoms and progress in treatment and Medication management   Autumn [name] is a 41 year old female with a history of bipolar disorder admitted for a psychotic break. She has been given various diagnoses, currently has a complicated pharmacological regimen. Overall for affective symptoms seem to be on the forefront and though she is on lithium and Tegretol, they seem to be suboptimally controlled. Her next lithium  level will be done tomorrow and dose can be adjusted after the result. We  will plan to increase the dose of Abilify to control evolving manic symptoms. Has a documented allergy to Haldol causing EPS and therefore this negates the use of Thorazine.  1. Bipolar disorder. We continued Tegretol 200 mg twice a day and changed lithium to lithium CR 450 mg twice a day for mood stabilization and Abilify 20 mg for psychosis. Tegretol level was 6.8, her lithium level was 0.63 (07/05/17). Dose increased to 450 mg every 12 hours. This change was done on 07/07/2017, do for lithium level (07/12/17). Does not want to take Abilify maintain a Depo injection. Plan to increase Abilify dose to 30 mg daily. Will give her an additional 20 mg 1 time dose today.  2. EPS. Continue benztropine 0.5 mg q day. There is a history of dystonia with abilify.  3. Insomnia. Restoril 30 mg.  4. Amphetamines, opiates and marijuana use . The Autumn [name] will be referred to intensive outpatient substance abuse upon discharge.   5. Tobacco use disorder. Nicotine patch is available.   6. Metabolic syndrome monitoring. Lipid panel, TSH and HgbA1C were completed on 06/27/2017.  7. EKG. Sinus tachycardia with PVCs, QTc 466.  8. Pregnancy test is negative.  9. Disposition. She will be discharged to home with her boyfriend. She will follow up with [facility].   Ramond Dial, MD 07/11/2017, 1:22 PM

## 2017-07-11 NOTE — BHH Group Notes (Signed)
[facility] Group Notes:  (Nursing/MHT/Case Management/Adjunct)  Date:  07/11/2017  Time:  10:22 PM  Type of Therapy:  Group Therapy  Participation Level:  Active  Participation Quality:  Appropriate  Affect:  Appropriate  Cognitive:  Appropriate  Insight:  Appropriate  Engagement in Group:  Engaged  Modes of Intervention:  Discussion  Summary of Progress/Problems:  Autumn [name] 07/11/2017, 10:22 PM

## 2017-07-11 NOTE — Progress Notes (Signed)
Patient anxious and manic this am. Pt wanting to be discharged. Pt called boyfriend, told him she had been discharging. Increased rounding on patient due to elopement risk, pt followed security to [place]. Pt did not get beyond doors. Redirected back onto unit. Pt needing redirection throughout shift. md made aware. Medications adjusted.  Encouragement and support offered. Frequent redirection given to patient. meds given as prescribed.  Increased rounding on unit due to elopement risk. Patient remains safe with q 15 min checks.

## 2017-07-11 NOTE — BHH Group Notes (Signed)
[facility] LCSW Group Therapy Note  Date/Time: 07/11/2017 9:30am  Type of Therapy and Topic:  Group Therapy:  Overcoming Obstacles  Participation Level:  Minimal  Description of Group:    In this group patients will be encouraged to explore what they see as obstacles to their own wellness and recovery. They will be guided to discuss their thoughts, feelings, and behaviors related to these obstacles. The group will process together ways to cope with barriers, with attention given to specific choices patients can make. Each patient will be challenged to identify changes they are motivated to make in order to overcome their obstacles. This group will be process-oriented, with patients participating in exploration of their own experiences as well as giving and receiving support and challenge from other group members.  Therapeutic Goals: 1. Patient will identify personal and current obstacles as they relate to admission. 2. Patient will identify barriers that currently interfere with their wellness or overcoming obstacles.  3. Patient will identify feelings, thought process and behaviors related to these barriers. 4. Patient will identify two changes they are willing to make to overcome these obstacles:    Summary of Patient Progress  Pt attended group, quiet, minimal participation       Therapeutic Modalities:   Cognitive Behavioral Therapy Solution Focused Therapy Motivational Interviewing Relapse Prevention [place], LCSW

## 2017-07-12 LAB — LITHIUM LEVEL: Lithium Lvl: 0.48 mmol/L — ABNORMAL LOW (ref 0.60–1.20)

## 2017-07-12 NOTE — BHH Group Notes (Signed)
[place] Group Notes:  (Nursing/MHT/Case Management/Adjunct)  Date:  07/12/2017  Time:  10:46 AM  Type of Therapy:  Psychoeducational Skills  Participation Level:  Active  Participation Quality:  Appropriate  Affect:  Appropriate  Cognitive:  Appropriate  Insight:  Appropriate  Engagement in Group:  Engaged  Modes of Intervention:  Discussion and Education  Summary of Progress/Problems:  Drake Leach 07/12/2017, 10:46 AM

## 2017-07-12 NOTE — BHH Group Notes (Signed)
[facility] Group Notes:  (Nursing/MHT/Case Management/Adjunct)  Date:  07/12/2017  Time:  9:37 PM  Type of Therapy:  Group Therapy  Participation Level:  Active  Participation Quality:  Appropriate  Affect:  Appropriate  Cognitive:  Appropriate  Insight:  Appropriate  Engagement in Group:  Engaged  Modes of Intervention:  Discussion  Summary of Progress/Problems:  Kandis Fantasia 07/12/2017, 9:37 PM

## 2017-07-12 NOTE — Progress Notes (Signed)
[facility] MD Progress Note  07/12/2017 4:10 PM Autumn [name]  MRN:  [id number] Subjective:   Autumn [name] is a 41 year old separated Caucasian female from [place]. This patient carries a diagnosis of bipolar disorder type and substance abuse. She presented to the emergency department on the involuntary commitment for erratic behavior In the [place] parking lot, she was knocking on people's windows and running around the parking lot.  07/09/17  -  "I desired to be punished for the bad stuff I've done. I had a nervous breakdown and and that's when I thought am supposed to be doing this in public. I couldn't sleep at all last night and dad's memory kept coming back. I was unable to sleep because of my bad dreams and kept waking up". Medications are helping. Denies any thoughts of wanting to hurt herself. Denies any AH or VH. Is seen to be interactive with other patients in the dayroom. We also discussed about the abilify maintena injection, patient indicates that she does not like needles, would not be for the injection.  07/10/17 - seen to be interacting with other patients on the unit. Endorses that she went to bed hungry last night and could not sleep at all. Denies any side effects with her medications, denies any auditory or visual hallucinations.  07/11/17 - patient is seen to be pacing around on the unit, last night there was an instance wherein she couldn't sleep and was out of her room at 3 AM in the morning. Today nursing staff found her on her way to exit out of the unit. During my interview she tells me that an apocalypse is coming to [place] and she is getting very anxious about this. Feels that something bad is going to happen to everybody she knows. Is restless and fidgety when she talks, endorses that she could not sleep last night because of this anxiety.  07/12/2017. Autumn [name] is very paranoid and frightened today waiting for the apocalypse, her belongings packed, ready to leave.  She almost escaped from the unit yesterday. There are no somatic complaints or side effects from medications. Sleep is fair.   Per nursing:  D: Pt denies SI/HI/AVH but noted responding to internal stimuli. Patient's affect is flat but brightens upon approach, her  mood is pleasant, and she is cooperative with treatment plan. Patient was noted to have some confusion this evening to place and time, and was often redirected. Patient's thoughts are disorganized, speech is tangential and sometimes incoherent. Patient appears less anxious and she is interacting with peers and staff appropriately.  A: Pt was offered support and encouragement. Pt was given scheduled medications. Pt was encouraged to attend groups. Q 15 minute checks were done for safety.  R:Patient  attends groups and interacts well with peers and staff. Pt is complaint with medication. Patient is receptive to treatment and safety maintained on unit.  Principal Problem: Bipolar I disorder, most recent episode manic, severe without psychotic features ([place]) Diagnosis:   Patient Active Problem List   Diagnosis Date Noted  . Bipolar I disorder, most recent episode manic, severe without psychotic features ([place]) [F31.13] 07/05/2017  . Tobacco use disorder [F17.200] 06/27/2017  . 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 Opioid use disorder, severe, dependence ([place]) [F11.20] 06/27/2017  . Cannabis use disorder, severe, dependence ([place]) [F12.20] 06/27/2017  . Hx of glaucoma [Z86.69] 10/02/2013   Total Time spent with patient: 30 minutes  Past Psychiatric History: Patient has been diagnosed with schizoaffective  disorder, bipolar disorder, borderline personality disorder, depression and generalized anxiety disorder. She is being hospitalized here in our unit a multitude of times. She also has been [facility] at least twice. Patient says she has attempted to walk in front of cars in the past. She does report  history of self injury by cutting but has not engaged in this in years  She was just discharged from our unit on August 24. She returned to the ER manic again on August 27.  Past Medical History:  Past Medical History:  Diagnosis Date  . Allergy    Seasonal, Ultram (seizures)  . Anemia 2005  . Bipolar 1 disorder ([place])   . Glaucoma   . Seizures ([place]) 2008    Past Surgical History:  Procedure Laterality Date  . BREAST BIOPSY Right    x 2  . CESAREAN SECTION     x 2   Family History:  Family History  Problem Relation Age of Onset  . Hypertension Mother   . Hyperlipidemia Mother   . Parkinson's disease Father    Family Psychiatric  History: denies  Social History:  History  Alcohol Use No    Comment: Ocassional     History  Drug Use    Comment: percocet -last use over 1 week ago    Social History   Social History  . Marital status: Single    Spouse name: N/A  . Number of children: N/A  . Years of education: N/A   Social History Main Topics  . Smoking status: Current Every Day Smoker    Packs/day: 1.00    Years: 9.00  . Smokeless tobacco: Never Used     Comment: Patient not interested  . Alcohol use No     Comment: Ocassional  . Drug use: Yes     Comment: percocet -last use over 1 week ago  . Sexual activity: No   Other Topics Concern  . None   Social History Narrative  . None     Current Medications: Current Facility-Administered Medications  Medication Dose Route Frequency Provider Last Rate Last Dose  . alum & mag hydroxide-simeth (MAALOX/MYLANTA) 200-200-20 MG/5ML suspension 30 mL  30 mL Oral Q4H PRN Shameeka Silliman B, MD      . ARIPiprazole (ABILIFY) tablet 30 mg  30 mg Oral Daily Ramond Dial, MD   30 mg at 07/12/17 0834  . ARIPiprazole ER SRER 400 mg  400 mg Intramuscular Q28 days Khyler Urda B, MD      . benztropine (COGENTIN) tablet 0.5 mg  0.5 mg Oral Daily Hildred Priest, MD   0.5 mg at 07/12/17 0834  .  carbamazepine (TEGRETOL) tablet 200 mg  200 mg Oral BID AC & HS Yunuen Mordan B, MD   200 mg at 07/12/17 0834  . feeding supplement (ENSURE ENLIVE) (ENSURE ENLIVE) liquid 237 mL  237 mL Oral BID BM Hildred Priest, MD   237 mL at 07/12/17 1400  . lithium carbonate (ESKALITH) CR tablet 450 mg  450 mg Oral Q12H Hildred Priest, MD   450 mg at 07/12/17 0834  . LORazepam (ATIVAN) tablet 2 mg  2 mg Oral Q4H PRN Ramond Dial, MD   2 mg at 07/11/17 2217  . magnesium hydroxide (MILK OF MAGNESIA) suspension 30 mL  30 mL Oral Daily PRN Anmol Paschen B, MD      . nicotine (NICODERM CQ - dosed in mg/24 hours) patch 21 mg  21 mg Transdermal Daily Alexus Galka B,  MD   21 mg at 07/12/17 0834  . ondansetron (ZOFRAN-ODT) disintegrating tablet 4 mg  4 mg Oral Q8H PRN Hildred Priest, MD      . temazepam (RESTORIL) capsule 30 mg  30 mg Oral QHS Jaimin Krupka B, MD   30 mg at 07/11/17 2217    Lab Results:  Results for orders placed or performed during the hospital encounter of 07/05/17 (from the past 48 hour(s))  Lithium level     Status: Abnormal   Collection Time: 07/12/17  7:04 AM  Result Value Ref Range   Lithium Lvl 0.48 (L) 0.60 - 1.20 mmol/L    Blood Alcohol level:  Lab Results  Component Value Date   ETH <5 07/04/2017   ETH 18 (H) 74/06/1447    Metabolic Disorder Labs: Lab Results  Component Value Date   HGBA1C 5.0 06/27/2017   MPG 96.8 06/27/2017   No results found for: PROLACTIN Lab Results  Component Value Date   CHOL 152 06/27/2017   TRIG 68 06/27/2017   HDL 59 06/27/2017   CHOLHDL 2.6 06/27/2017   VLDL 14 06/27/2017   LDLCALC 79 06/27/2017   LDLCALC 97 10/02/2013    Physical Findings: AIMS:  , ,  ,  ,    CIWA:    COWS:     Musculoskeletal: Strength & Muscle Tone: within normal limits Gait & Station: normal Patient leans: N/A  Psychiatric Specialty Exam: Physical Exam  Nursing note and vitals  reviewed. Musculoskeletal: Normal range of motion.  Psychiatric: Her speech is normal and behavior is normal. Her affect is blunt. Thought content is paranoid and delusional. Cognition and memory are normal. She expresses impulsivity.    Review of Systems  Psychiatric/Behavioral: Positive for depression and substance abuse.  All other systems reviewed and are negative.   Blood pressure 98/71, pulse 92, temperature 98 F (36.7 C), temperature source Oral, resp. rate 18, height 5\' 1"  (1.549 m), weight 38.1 kg (84 lb), SpO2 99 %.Body mass index is 15.87 kg/m.  General Appearance: Disheveled  Eye Contact:  Fair  Speech:  Slow  Volume:  Decreased  Mood:  Dysphoric  Affect: labile  Thought Process:  Linear and Descriptions of Associations: Loose  Orientation:  Full (Time, Place, and Person)  Thought Content:  Hallucinations: None  Suicidal Thoughts:  No  Homicidal Thoughts:  No  Memory:  Immediate;   Fair Recent;   Fair Remote;   Fair  Judgement:  Fair  Insight:  Fair  Psychomotor Activity:  Decreased  Concentration:  Concentration: Poor and Attention Span: Poor  Recall:  Poor  Fund of Knowledge:  Fair  Language:  Fair  Akathisia:  No  Handed:    AIMS (if indicated):     Assets:  Armed forces logistics/support/administrative officer Social Support  ADL's:  Intact  Cognition:  WNL  Sleep:  Number of Hours: 5.15     Treatment Plan Summary: Daily contact with patient to assess and evaluate symptoms and progress in treatment and Medication management   Ms. Loscalzo is a 41 year old female with a history of bipolar disorder admitted for a psychotic break. She has been given various diagnoses, currently has a complicated pharmacological regimen. Overall for affective symptoms seem to be on the forefront and though she is on lithium and Tegretol, they seem to be suboptimally controlled. Her next lithium level will be done tomorrow and dose can be adjusted after the result. We will plan to increase the dose of Abilify  to control evolving manic symptoms.  1. Bipolar disorder. We continued Tegretol and lithium CR for mood stabilization and Abilify 20 mg for psychosis. Tegretol level was 6.8. Lithium level is subtherapeutic today, 0.48, in spite of dose increase last week. She refused Abilify maintana. Abilify increased to 30 mg.   2. EPS. Continue benztropine 0.5 mg q day. There is a history of dystonia with abilify.  3. Insomnia. Restoril 30 mg.  4. Amphetamines, opiates and marijuana use . The patient will be referred to intensive outpatient substance abuse upon discharge.   5. Tobacco use disorder. Nicotine patch is available.   6. Metabolic syndrome monitoring. Lipid panel, TSH and HgbA1C were completed on 06/27/2017.  7. EKG. Sinus tachycardia with PVCs, QTc 466.  8. Pregnancy test is negative.  9. Disposition. She will be discharged to home with her boyfriend. She will follow up with [facility].   Orson Slick, MD 07/12/2017, 4:10 PM

## 2017-07-12 NOTE — Progress Notes (Signed)
D: Pt denies SI/HI/AVH but noted responding to internal stimuli. Patient's affect is flat but brightens upon approach, her  mood is pleasant, and she is cooperative with treatment plan. Patient was noted to have some confusion this evening to place and time, and was often redirected. Patient's thoughts are disorganized, speech is tangential and sometimes incoherent. Patient appears less anxious and she is interacting with peers and staff appropriately.  A: Pt was offered support and encouragement. Pt was given scheduled medications. Pt was encouraged to attend groups. Q 15 minute checks were done for safety.  R:Patient  attends groups and interacts well with peers and staff. Pt is complaint with medication. Patient is receptive to treatment and safety maintained on unit.

## 2017-07-12 NOTE — Tx Team (Signed)
Interdisciplinary Treatment and Diagnostic Plan Update  07/12/2017 Time of Session: 11:00 AM Autumn [name] MRN: [id number]  Principal Diagnosis: Bipolar I disorder, most recent episode manic, severe without psychotic features ([place])  Secondary Diagnoses: Principal Problem:   Bipolar I disorder, most recent episode manic, severe without psychotic features ([place]) Active Problems:   Hx of glaucoma   Tobacco use disorder   Amphetamine use disorder, severe ([facility])   Opioid use disorder, severe, dependence ([place])   Cannabis use disorder, severe, dependence ([place])   Current Medications:  Current Facility-Administered Medications  Medication Dose Route Frequency Provider Last Rate Last Dose  . alum & mag hydroxide-simeth (MAALOX/MYLANTA) 200-200-20 MG/5ML suspension 30 mL  30 mL Oral Q4H PRN [name], [name], MD      . ARIPiprazole (ABILIFY) tablet 30 mg  30 mg Oral Daily Ramond Dial, MD   30 mg at 07/12/17 0834  . ARIPiprazole ER SRER 400 mg  400 mg Intramuscular Q28 days [name], [name], MD      . benztropine (COGENTIN) tablet 0.5 mg  0.5 mg Oral Daily Hildred Priest, MD   0.5 mg at 07/12/17 0834  . carbamazepine (TEGRETOL) tablet 200 mg  200 mg Oral BID AC & HS [name], [name], MD   200 mg at 07/12/17 0834  . feeding supplement (ENSURE ENLIVE) (ENSURE ENLIVE) liquid 237 mL  237 mL Oral BID BM Hildred Priest, MD   237 mL at 07/12/17 1400  . lithium carbonate (ESKALITH) CR tablet 450 mg  450 mg Oral Q12H Hildred Priest, MD   450 mg at 07/12/17 0834  . LORazepam (ATIVAN) tablet 2 mg  2 mg Oral Q4H PRN Ramond Dial, MD   2 mg at 07/11/17 2217  . magnesium hydroxide (MILK OF MAGNESIA) suspension 30 mL  30 mL Oral Daily PRN [name], [name], MD      . nicotine (NICODERM CQ - dosed in mg/24 hours) patch 21 mg  21 mg Transdermal Daily [name], [name], MD   21 mg at 07/12/17 0834  . ondansetron (ZOFRAN-ODT) disintegrating  tablet 4 mg  4 mg Oral Q8H PRN Hildred Priest, MD      . temazepam (RESTORIL) capsule 30 mg  30 mg Oral QHS [name], [name], MD   30 mg at 07/11/17 2217   PTA Medications: Prescriptions Prior to Admission  Medication Sig Dispense Refill Last Dose  . carbamazepine (TEGRETOL) 200 MG tablet Take 1 tablet (200 mg total) by mouth 2 (two) times daily. 60 tablet 0 07/04/2017 at am  . citalopram (CELEXA) 20 MG tablet Take 1 tablet (20 mg total) by mouth daily. 30 tablet 0 07/04/2017 at am  . clotrimazole (GYNE-LOTRIMIN 3) 2 % vaginal cream Place 1 Applicatorful vaginally at bedtime. 21 g 0 unknown at unknown  . lithium carbonate (LITHOBID) 300 MG CR tablet Take 1 tablet (300 mg total) by mouth every 12 (twelve) hours. 60 tablet 0 07/04/2017 at am  . traZODone (DESYREL) 150 MG tablet Take 1 tablet (150 mg total) by mouth at bedtime. 30 tablet 0 07/03/2017 at pm    Patient Stressors: Financial difficulties Medication change or noncompliance  Patient Strengths: Average or above average intelligence Capable of independent living Work skills  Treatment Modalities: Medication Management, Group therapy, Case management,  1 to 1 session with clinician, Psychoeducation, Recreational therapy.   Physician Treatment Plan for Primary Diagnosis: Bipolar I disorder, most recent episode manic, severe without psychotic features ([place]) Long Term Goal(s): Improvement in symptoms so as ready  for discharge Improvement in symptoms so as ready for discharge   Short Term Goals: Ability to identify changes in lifestyle to reduce recurrence of condition will improve Ability to demonstrate self-control will improve Compliance with prescribed medications will improve Ability to identify changes in lifestyle to reduce recurrence of condition will improve Ability to identify and develop effective coping behaviors will improve  Medication Management: Evaluate patient's response, side effects, and tolerance  of medication regimen.  Therapeutic Interventions: 1 to 1 sessions, Unit Group sessions and Medication administration.  Evaluation of Outcomes: Progressing  Physician Treatment Plan for Secondary Diagnosis: Principal Problem:   Bipolar I disorder, most recent episode manic, severe without psychotic features ([place]) Active Problems:   Hx of glaucoma   Tobacco use disorder   Amphetamine use disorder, severe ([facility])   Opioid use disorder, severe, dependence ([place])   Cannabis use disorder, severe, dependence ([place])  Long Term Goal(s): Improvement in symptoms so as ready for discharge Improvement in symptoms so as ready for discharge   Short Term Goals: Ability to identify changes in lifestyle to reduce recurrence of condition will improve Ability to demonstrate self-control will improve Compliance with prescribed medications will improve Ability to identify changes in lifestyle to reduce recurrence of condition will improve Ability to identify and develop effective coping behaviors will improve     Medication Management: Evaluate patient's response, side effects, and tolerance of medication regimen.  Therapeutic Interventions: 1 to 1 sessions, Unit Group sessions and Medication administration.  Evaluation of Outcomes: Progressing   RN Treatment Plan for Primary Diagnosis: Bipolar I disorder, most recent episode manic, severe without psychotic features ([place]) Long Term Goal(s): Knowledge of disease and therapeutic regimen to maintain health will improve  Short Term Goals: Ability to demonstrate self-control, Ability to verbalize feelings will improve and Compliance with prescribed medications will improve  Medication Management: RN will administer medications as ordered by provider, will assess and evaluate patient's response and provide education to patient for prescribed medication. RN will report any adverse and/or side effects to prescribing provider.  Therapeutic Interventions: 1 on 1  counseling sessions, Psychoeducation, Medication administration, Evaluate responses to treatment, Monitor vital signs and CBGs as ordered, Perform/monitor CIWA, COWS, AIMS and Fall Risk screenings as ordered, Perform wound care treatments as ordered.  Evaluation of Outcomes: Progressing   LCSW Treatment Plan for Primary Diagnosis: Bipolar I disorder, most recent episode manic, severe without psychotic features ([place]) Long Term Goal(s): Safe transition to appropriate next level of care at discharge, Engage patient in therapeutic group addressing interpersonal concerns.  Short Term Goals: Engage patient in aftercare planning with referrals and resources, Increase social support and Identify triggers associated with mental health/substance abuse issues  Therapeutic Interventions: Assess for all discharge needs, 1 to 1 time with Social worker, Explore available resources and support systems, Assess for adequacy in community support network, Educate family and significant other(s) on suicide prevention, Complete Psychosocial Assessment, Interpersonal group therapy.  Evaluation of Outcomes: Progressing   Progress in Treatment: Attending groups: Yes. Participating in groups: Yes. Taking medication as prescribed: Yes. Toleration medication: Yes. Family/Significant other contact made: No, will contact:  CSW assessing appropriate contact. Patient understands diagnosis: Yes. Discussing patient identified problems/goals with staff: Yes. Medical problems stabilized or resolved: Yes. Denies suicidal/homicidal ideation: Yes. Issues/concerns per patient self-inventory: No.  New problem(s) identified: No, Describe:  None.  New Short Term/Long Term Goal(s): Patient stated that her goal is to take better care of her health.  Discharge Plan or Barriers: Patient agreeable  to follow up with [facility]. Patient plans to discharge back with her husband.  Reason for Continuation of Hospitalization:  Anxiety Mania Other; describe paranoia   Attempted to elope on 07/11/17  Estimated Length of Stay: 3-5 days   Attendees: Patient: not present 07/12/2017 3:52 PM  Physician: Dr. Duanne Guess, MD  07/12/2017 3:52 PM  Nursing: Marcie Bal, RN  07/12/2017 3:52 PM  RN Care Manager: 07/12/2017 3:52 PM  Social Worker: Dossie Arbour 07/12/2017 3:52 PM  Recreational Therapist:  07/12/2017 3:52 PM  Other:  07/12/2017 3:52 PM  Other:  07/12/2017 3:52 PM  Other: 07/12/2017 3:52 PM    Scribe for Treatment Team: Lilly Cove, LCSW 07/12/2017 3:52 PM

## 2017-07-12 NOTE — Progress Notes (Signed)
Patient isolative to room today. No psychotic episodes noted. Patient goal was to rest today and attend group. Medication and group compliant. Appropriate but minimal interaction with staff and peers. Denies  SI, HI,AVh. Encouragement and support offered. Safety checks maintained.

## 2017-07-13 MED ORDER — OLANZAPINE 5 MG PO TABS
15.0000 mg | ORAL_TABLET | Freq: Every day | ORAL | Status: DC
  Administered 2017-07-13: 15 mg via ORAL
  Filled 2017-07-13: qty 1

## 2017-07-13 NOTE — Progress Notes (Signed)
Pt denies SI/HI, AVH but noted to be responding to internal stimuli. Patient's affect is flat but brightens when spoken to by staff.  Her mood is flat, and she is cooperative with her treatment plan. Patient appears less anxious and she is interacting with peers and staff appropriately. Patient is alert and oriented to person. Skin is warm, dry and intact. Patient will be monitored and physician notified of any acute changes.

## 2017-07-13 NOTE — [facility] Group Notes (Signed)
[facility] Group Notes:  (Nursing/MHT/Case Management/Adjunct)  Date:  07/13/2017  Time:  2:46 PM  Type of Therapy:  Psychoeducational Skills  Participation Level:  Active  Participation Quality:  Appropriate, Attentive and Sharing  Affect:  Appropriate  Cognitive:  Alert and Appropriate  Insight:  Appropriate and Good  Engagement in Group:  Engaged  Modes of Intervention:  Discussion, Education and Problem-solving  Summary of Progress/Problems:  Autumn [name] 07/13/2017, 2:46 PM

## 2017-07-13 NOTE — Plan of Care (Signed)
Problem: Safety: Goal: Ability to remain free from injury will improve Outcome: Progressing Patient remains safe on the unit at this time.

## 2017-07-13 NOTE — [facility] Group Notes (Signed)
[facility] LCSW Group Therapy Note  Date/Time Sept 5th from 9:30-10:15 Type of Therapy/Topic: Group Therapy: Emotion Regulation  Participation Level: very good Mood: Good Description of Group: Therapeutic The purpose of this group is to assist patients in learning to regulate negative emotions and experience positive emotions. Patients will be guided to discuss ways in which they have been vulnerable to their negative emotions. These vulnerabilities will be juxtaposed with experiences of positive emotions or situations, and patients challenged to use positive emotions to combat negative ones. Special emphasis will be placed on coping with negative emotions in conflict situations, and patients will process healthy conflict resolution skills.  Therapeutic Goals:  Patient will identify two positive emotions or experiences to reflect on in order to balance out negative emotions:  Patient will label two or more emotions that they find the most difficult to experience:  Patient will be able to demonstrate positive conflict resolution skills through discussion or role plays:   Summary of Patient Progress: This patient was reflective in group and was supportive towards her peers. She was able to reflect on many of the emotions. Patient was able to follow group peers and listen attentively. She did share some ideas that were personal in nature when the discussion of Group homes came up. LCSW advised her I would be available to listen to her later today or tomorrow. Her safety was checked and she is no longer at that group home but further discussion is warranted.  Therapeutic Modalities:  Cognitive Behavioral Therapy  Feelings Identification  Dialectical Behavioral Therapy  Torsten Weniger LCSW

## 2017-07-13 NOTE — Progress Notes (Addendum)
[facility] MD Progress Note  07/13/2017 12:37 PM Brylan Seubert  MRN:  [id number] Subjective:   Ms. Ritchie is a 41 year old separated Caucasian female from [place]. This patient carries a diagnosis of bipolar disorder type and substance abuse. She presented to the emergency department on the involuntary commitment for erratic behavior In the [place] parking lot, she was knocking on people's windows and running around the parking lot.  07/09/17  -  "I desired to be punished for the bad stuff I've done. I had a nervous breakdown and and that's when I thought am supposed to be doing this in public. I couldn't sleep at all last night and dad's memory kept coming back. I was unable to sleep because of my bad dreams and kept waking up". Medications are helping. Denies any thoughts of wanting to hurt herself. Denies any AH or VH. Is seen to be interactive with other patients in the dayroom. We also discussed about the abilify maintena injection, patient indicates that she does not like needles, would not be for the injection.  07/10/17 - seen to be interacting with other patients on the unit. Endorses that she went to bed hungry last night and could not sleep at all. Denies any side effects with her medications, denies any auditory or visual hallucinations.  07/11/17 - patient is seen to be pacing around on the unit, last night there was an instance wherein she couldn't sleep and was out of her room at 3 AM in the morning. Today nursing staff found her on her way to exit out of the unit. During my interview she tells me that an apocalypse is coming to [place] and she is getting very anxious about this. Feels that something bad is going to happen to everybody she knows. Is restless and fidgety when she talks, endorses that she could not sleep last night because of this anxiety.  07/12/2017. Ms. Topp is very paranoid and frightened today waiting for the apocalypse, her belongings packed, ready to  leave. She almost escaped from the unit yesterday. There are no somatic complaints or side effects from medications. Sleep is fair.   07/13/2017. Ms. Cheadle remains paranoid, delusional and fearful. She was very tearful today anticipating something terrible to happen. She has been arguing with her boyfriend on the phone when he is unwilling to discuss her paranoid ideas to the point that she no longer wants to be with him. She does not want injectable antipsychotic because she lost period for 3 months after Invega sustenna injection. This is not impossible due to PRL increase. She did take haldol last night. There are no somatic complaints. She slept better last night.  Per nursing:  Pt denies SI/HI, AVH but noted to be responding to internal stimuli. Patient's affect is flat but brightens when spoken to by staff.  Her mood is flat, and she is cooperative with her treatment plan. Patient appears less anxious and she is interacting with peers and staff appropriately. Patient is alert and oriented to person. Skin is warm, dry and intact. Patient will be monitored and physician notified of any acute changes.  Principal Problem: Bipolar I disorder, most recent episode manic, severe without psychotic features ([place]) Diagnosis:   Patient Active Problem List   Diagnosis Date Noted  . Bipolar I disorder, most recent episode manic, severe without psychotic features ([place]) [F31.13] 07/05/2017  . Tobacco use disorder [F17.200] 06/27/2017  . 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 Opioid use disorder, severe,  dependence ([place]) [F11.20] 06/27/2017  . Cannabis use disorder, severe, dependence ([place]) [F12.20] 06/27/2017  . Hx of glaucoma [Z86.69] 10/02/2013   Total Time spent with patient: 30 minutes  Past Psychiatric History: Patient has been diagnosed with schizoaffective disorder, bipolar disorder, borderline personality disorder, depression and generalized anxiety disorder. She is being  hospitalized here in our unit a multitude of times. She also has been [facility] at least twice. Patient says she has attempted to walk in front of cars in the past. She does report history of self injury by cutting but has not engaged in this in years  She was just discharged from our unit on August 24. She returned to the ER manic again on August 27.  Past Medical History:  Past Medical History:  Diagnosis Date  . Allergy    Seasonal, Ultram (seizures)  . Anemia 2005  . Bipolar 1 disorder ([place])   . Glaucoma   . Seizures ([place]) 2008    Past Surgical History:  Procedure Laterality Date  . BREAST BIOPSY Right    x 2  . CESAREAN SECTION     x 2   Family History:  Family History  Problem Relation Age of Onset  . Hypertension Mother   . Hyperlipidemia Mother   . Parkinson's disease Father    Family Psychiatric  History: denies  Social History:  History  Alcohol Use No    Comment: Ocassional     History  Drug Use    Comment: percocet -last use over 1 week ago    Social History   Social History  . Marital status: Single    Spouse name: N/A  . Number of children: N/A  . Years of education: N/A   Social History Main Topics  . Smoking status: Current Every Day Smoker    Packs/day: 1.00    Years: 9.00  . Smokeless tobacco: Never Used     Comment: Patient not interested  . Alcohol use No     Comment: Ocassional  . Drug use: Yes     Comment: percocet -last use over 1 week ago  . Sexual activity: No   Other Topics Concern  . None   Social History Narrative  . None     Current Medications: Current Facility-Administered Medications  Medication Dose Route Frequency Provider Last Rate Last Dose  . alum & mag hydroxide-simeth (MAALOX/MYLANTA) 200-200-20 MG/5ML suspension 30 mL  30 mL Oral Q4H PRN Messina Kosinski B, MD      . ARIPiprazole (ABILIFY) tablet 30 mg  30 mg Oral Daily Ramond Dial, MD   30 mg at 07/13/17 0828  . benztropine  (COGENTIN) tablet 0.5 mg  0.5 mg Oral Daily Hildred Priest, MD   0.5 mg at 07/13/17 0828  . carbamazepine (TEGRETOL) tablet 200 mg  200 mg Oral BID AC & HS Keriana Sarsfield B, MD   200 mg at 07/13/17 0830  . feeding supplement (ENSURE ENLIVE) (ENSURE ENLIVE) liquid 237 mL  237 mL Oral BID BM Hildred Priest, MD   237 mL at 07/13/17 1005  . lithium carbonate (ESKALITH) CR tablet 450 mg  450 mg Oral Q12H Hildred Priest, MD   450 mg at 07/13/17 6283  . LORazepam (ATIVAN) tablet 2 mg  2 mg Oral Q4H PRN Ramond Dial, MD   2 mg at 07/12/17 2136  . magnesium hydroxide (MILK OF MAGNESIA) suspension 30 mL  30 mL Oral Daily PRN Herman Mell B, MD      .  nicotine (NICODERM CQ - dosed in mg/24 hours) patch 21 mg  21 mg Transdermal Daily Melaya Hoselton B, MD   21 mg at 07/13/17 0829  . ondansetron (ZOFRAN-ODT) disintegrating tablet 4 mg  4 mg Oral Q8H PRN Hildred Priest, MD      . temazepam (RESTORIL) capsule 30 mg  30 mg Oral QHS Wiletta Bermingham B, MD   30 mg at 07/12/17 2135    Lab Results:  Results for orders placed or performed during the hospital encounter of 07/05/17 (from the past 48 hour(s))  Lithium level     Status: Abnormal   Collection Time: 07/12/17  7:04 AM  Result Value Ref Range   Lithium Lvl 0.48 (L) 0.60 - 1.20 mmol/L    Blood Alcohol level:  Lab Results  Component Value Date   ETH <5 07/04/2017   ETH 18 (H) 01/60/1093    Metabolic Disorder Labs: Lab Results  Component Value Date   HGBA1C 5.0 06/27/2017   MPG 96.8 06/27/2017   No results found for: PROLACTIN Lab Results  Component Value Date   CHOL 152 06/27/2017   TRIG 68 06/27/2017   HDL 59 06/27/2017   CHOLHDL 2.6 06/27/2017   VLDL 14 06/27/2017   LDLCALC 79 06/27/2017   LDLCALC 97 10/02/2013    Physical Findings: AIMS:  , ,  ,  ,    CIWA:    COWS:     Musculoskeletal: Strength & Muscle Tone: within normal limits Gait & Station:  normal Patient leans: N/A  Psychiatric Specialty Exam: Physical Exam  Nursing note and vitals reviewed. Musculoskeletal: Normal range of motion.  Psychiatric: Her speech is normal and behavior is normal. Her affect is blunt. Thought content is paranoid and delusional. Cognition and memory are normal. She expresses impulsivity.    Review of Systems  Psychiatric/Behavioral: Positive for depression and substance abuse.  All other systems reviewed and are negative.   Blood pressure 102/62, pulse 84, temperature 97.8 F (36.6 C), temperature source Oral, resp. rate 16, height 5\' 1"  (1.549 m), weight 38.1 kg (84 lb), SpO2 100 %.Body mass index is 15.87 kg/m.  General Appearance: Disheveled  Eye Contact:  Fair  Speech:  Slow  Volume:  Decreased  Mood:  Dysphoric  Affect: labile  Thought Process:  Linear and Descriptions of Associations: Loose  Orientation:  Full (Time, Place, and Person)  Thought Content:  Hallucinations: None  Suicidal Thoughts:  No  Homicidal Thoughts:  No  Memory:  Immediate;   Fair Recent;   Fair Remote;   Fair  Judgement:  Fair  Insight:  Fair  Psychomotor Activity:  Decreased  Concentration:  Concentration: Poor and Attention Span: Poor  Recall:  Poor  Fund of Knowledge:  Fair  Language:  Fair  Akathisia:  No  Handed:    AIMS (if indicated):     Assets:  Armed forces logistics/support/administrative officer Social Support  ADL's:  Intact  Cognition:  WNL  Sleep:  Number of Hours: 6     Treatment Plan Summary: Daily contact with patient to assess and evaluate symptoms and progress in treatment and Medication management   Ms. Agosto is a 41 year old female with a history of bipolar disorder admitted for a psychotic break. She has been given various diagnoses, currently has a complicated pharmacological regimen. Overall for affective symptoms seem to be on the forefront and though she is on lithium and Tegretol, they seem to be suboptimally controlled. Her next lithium level will be  done tomorrow and dose can  be adjusted after the result. We will plan to increase the dose of Abilify to control evolving manic symptoms.   1. Bipolar disorder. We continued Tegretol, lithium and Abilify for mood stabilization and psychosis. She refused Abilify maintana. Abilify increased to 30 mg and Lithium to 450 mg twice daily. She is allergic to Haldol injections. We will give additional Zyprexa for paranoia.   2. EPS. Continue benztropine 0.5 mg q day. There is a history of dystonia with abilify.  3. Insomnia. Restoril 30 mg.  4. Amphetamines, opiates and marijuana use . The patient will be referred to intensive outpatient substance abuse upon discharge.   5. Tobacco use disorder. Nicotine patch is available.   6. Metabolic syndrome monitoring. Lipid panel, TSH and HgbA1C were completed on 06/27/2017.  7. EKG. Sinus tachycardia with PVCs, QTc 466.  8. Pregnancy test is negative.  9. Disposition. She will be discharged to home with her boyfriend. She will follow up with [facility].   Orson Slick, MD 07/13/2017, 12:37 PM

## 2017-07-13 NOTE — Plan of Care (Signed)
Problem: Activity: Goal: Interest or engagement in leisure activities will improve Outcome: Progressing Patient engages in and attends groups.

## 2017-07-14 MED ORDER — OLANZAPINE 10 MG PO TABS
30.0000 mg | ORAL_TABLET | Freq: Every day | ORAL | Status: DC
  Administered 2017-07-14 – 2017-07-28 (×15): 30 mg via ORAL
  Filled 2017-07-14 (×15): qty 3

## 2017-07-14 NOTE — Progress Notes (Signed)
D : Pt denies SI/HI/AVH. Patient's affect is flat but brightens upon approach, her mood is pleasant, and she is cooperative with treatment plan. Patient's thoughts are less disorganized, speech is non pressured and coherent. Patient appears less anxious and no confusion is noted during the shift. Patient is  interacting with peers and staff appropriately.  A: Pt was offered support and encouragement. Pt was given scheduled medications. Pt was encouraged to attend groups. Q 15 minute checks were done for safety.  R:Patient  attends groups and interacts well with peers and staff. Pt is complaint with medication. Patient is receptive to treatment and safety maintained on unit.

## 2017-07-14 NOTE — Progress Notes (Signed)
[facility] MD Progress Note  07/14/2017 8:24 AM Autumn [name]  MRN:  [id number] Subjective:   Autumn [name] is a 41 year old separated Caucasian female from [place]. This patient carries a diagnosis of bipolar disorder type and substance abuse. She presented to the emergency department on the involuntary commitment for erratic behavior In the [place] parking lot, she was knocking on people's windows and running around the parking lot.  07/09/17  -  "I desired to be punished for the bad stuff I've done. I had a nervous breakdown and and that's when I thought am supposed to be doing this in public. I couldn't sleep at all last night and dad's memory kept coming back. I was unable to sleep because of my bad dreams and kept waking up". Medications are helping. Denies any thoughts of wanting to hurt herself. Denies any AH or VH. Is seen to be interactive with other patients in the dayroom. We also discussed about the abilify maintena injection, patient indicates that she does not like needles, would not be for the injection.  07/10/17 - seen to be interacting with other patients on the unit. Endorses that she went to bed hungry last night and could not sleep at all. Denies any side effects with her medications, denies any auditory or visual hallucinations.  07/11/17 - patient is seen to be pacing around on the unit, last night there was an instance wherein she couldn't sleep and was out of her room at 3 AM in the morning. Today nursing staff found her on her way to exit out of the unit. During my interview she tells me that an apocalypse is coming to [place] and she is getting very anxious about this. Feels that something bad is going to happen to everybody she knows. Is restless and fidgety when she talks, endorses that she could not sleep last night because of this anxiety.  07/12/2017. Autumn [name] is very paranoid and frightened today waiting for the apocalypse, her belongings packed, ready to leave.  She almost escaped from the unit yesterday. There are no somatic complaints or side effects from medications. Sleep is fair.   07/13/2017. Autumn [name] remains paranoid, delusional and fearful. She was very tearful today anticipating something terrible to happen. She has been arguing with her boyfriend on the phone when he is unwilling to discuss her paranoid ideas to the point that she no longer wants to be with him. She does not want injectable antipsychotic because she lost period for 3 months after Invega sustenna injection. This is not impossible due to PRL increase. She did take haldol last night. There are no somatic complaints. She slept better last night.  07/14/2017. Autumn [name] is still very paranoid. She broke up with her boyfriend of 17 years this morning on the phone because he "does not believe what I say and tries to keep me in the hospital". She tolerates new medications well. Her belongings are no longer packed as they were when she anticipated apocalypse. She seems slightly more relaxed. There are no somatic complaints. She did not go to group today. Reports good sleep last night.  Per nursing:  D :Pt denies SI/HI/AVH. Patient's affect is flat but brightens upon approach, her mood is pleasant, and she is cooperative with treatment plan. Patient's thoughts are less disorganized, speech is non pressured and coherent. Patient appears less anxious and no confusion is noted during the shift. Patient is  interacting with peers and staff appropriately.  A:Pt was offered support  and encouragement. Pt was given scheduled medications. Pt was encouraged to attend groups. Q 15 minute checks were done for safety.  R:Patient attends groups and interacts well with peers and staff. Pt is complaint with medication. Patient isreceptive to treatment and safety maintained on unit.  Principal Problem: Bipolar I disorder, most recent episode manic, severe without psychotic features ([place]) Diagnosis:   Patient  Active Problem List   Diagnosis Date Noted  . Bipolar I disorder, most recent episode manic, severe without psychotic features ([place]) [F31.13] 07/05/2017  . Tobacco use disorder [F17.200] 06/27/2017  . 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 Opioid use disorder, severe, dependence ([place]) [F11.20] 06/27/2017  . Cannabis use disorder, severe, dependence ([place]) [F12.20] 06/27/2017  . Hx of glaucoma [Z86.69] 10/02/2013   Total Time spent with patient: 30 minutes  Past Psychiatric History: Patient has been diagnosed with schizoaffective disorder, bipolar disorder, borderline personality disorder, depression and generalized anxiety disorder. She is being hospitalized here in our unit a multitude of times. She also has been [facility] at least twice. Patient says she has attempted to walk in front of cars in the past. She does report history of self injury by cutting but has not engaged in this in years  She was just discharged from our unit on August 24. She returned to the ER manic again on August 27.  Past Medical History:  Past Medical History:  Diagnosis Date  . Allergy    Seasonal, Ultram (seizures)  . Anemia 2005  . Bipolar 1 disorder ([place])   . Glaucoma   . Seizures ([place]) 2008    Past Surgical History:  Procedure Laterality Date  . BREAST BIOPSY Right    x 2  . CESAREAN SECTION     x 2   Family History:  Family History  Problem Relation Age of Onset  . Hypertension Mother   . Hyperlipidemia Mother   . Parkinson's disease Father    Family Psychiatric  History: denies  Social History:  History  Alcohol Use No    Comment: Ocassional     History  Drug Use    Comment: percocet -last use over 1 week ago    Social History   Social History  . Marital status: Single    Spouse name: N/A  . Number of children: N/A  . Years of education: N/A   Social History Main Topics  . Smoking status: Current Every Day Smoker    Packs/day: 1.00     Years: 9.00  . Smokeless tobacco: Never Used     Comment: Patient not interested  . Alcohol use No     Comment: Ocassional  . Drug use: Yes     Comment: percocet -last use over 1 week ago  . Sexual activity: No   Other Topics Concern  . None   Social History Narrative  . None     Current Medications: Current Facility-Administered Medications  Medication Dose Route Frequency Provider Last Rate Last Dose  . alum & mag hydroxide-simeth (MAALOX/MYLANTA) 200-200-20 MG/5ML suspension 30 mL  30 mL Oral Q4H PRN Jakhiya Brower B, MD      . ARIPiprazole (ABILIFY) tablet 30 mg  30 mg Oral Daily Ramond Dial, MD   30 mg at 07/13/17 0828  . benztropine (COGENTIN) tablet 0.5 mg  0.5 mg Oral Daily Hildred Priest, MD   0.5 mg at 07/13/17 0828  . carbamazepine (TEGRETOL) tablet 200 mg  200 mg Oral BID AC & HS  Orson Slick B, MD   200 mg at 07/13/17 2136  . feeding supplement (ENSURE ENLIVE) (ENSURE ENLIVE) liquid 237 mL  237 mL Oral BID BM Hildred Priest, MD   237 mL at 07/13/17 1412  . lithium carbonate (ESKALITH) CR tablet 450 mg  450 mg Oral Q12H Hildred Priest, MD   450 mg at 07/13/17 2000  . LORazepam (ATIVAN) tablet 2 mg  2 mg Oral Q4H PRN Ramond Dial, MD   2 mg at 07/12/17 2136  . magnesium hydroxide (MILK OF MAGNESIA) suspension 30 mL  30 mL Oral Daily PRN Humphrey Guerreiro B, MD      . nicotine (NICODERM CQ - dosed in mg/24 hours) patch 21 mg  21 mg Transdermal Daily Erendira Crabtree B, MD   21 mg at 07/13/17 0829  . OLANZapine (ZYPREXA) tablet 15 mg  15 mg Oral QHS Nazario Russom B, MD   15 mg at 07/13/17 2136  . ondansetron (ZOFRAN-ODT) disintegrating tablet 4 mg  4 mg Oral Q8H PRN Hildred Priest, MD      . temazepam (RESTORIL) capsule 30 mg  30 mg Oral QHS Abrar Bilton B, MD   30 mg at 07/13/17 2135    Lab Results:  No results found for this or any previous visit (from the past 48  hour(s)).  Blood Alcohol level:  Lab Results  Component Value Date   ETH <5 07/04/2017   ETH 18 (H) 50/53/9767    Metabolic Disorder Labs: Lab Results  Component Value Date   HGBA1C 5.0 06/27/2017   MPG 96.8 06/27/2017   No results found for: PROLACTIN Lab Results  Component Value Date   CHOL 152 06/27/2017   TRIG 68 06/27/2017   HDL 59 06/27/2017   CHOLHDL 2.6 06/27/2017   VLDL 14 06/27/2017   LDLCALC 79 06/27/2017   LDLCALC 97 10/02/2013    Physical Findings: AIMS:  , ,  ,  ,    CIWA:    COWS:     Musculoskeletal: Strength & Muscle Tone: within normal limits Gait & Station: normal Patient leans: N/A  Psychiatric Specialty Exam: Physical Exam  Nursing note and vitals reviewed. Musculoskeletal: Normal range of motion.  Psychiatric: Her speech is normal and behavior is normal. Her affect is blunt. Thought content is paranoid and delusional. Cognition and memory are normal. She expresses impulsivity.    Review of Systems  Psychiatric/Behavioral: Positive for depression and substance abuse.  All other systems reviewed and are negative.   Blood pressure 98/71, pulse 86, temperature 98 F (36.7 C), temperature source Oral, resp. rate 17, height 5\' 1"  (1.549 m), weight 38.1 kg (84 lb), SpO2 100 %.Body mass index is 15.87 kg/m.  General Appearance: Disheveled  Eye Contact:  Fair  Speech:  Slow  Volume:  Decreased  Mood:  Dysphoric  Affect: labile  Thought Process:  Linear and Descriptions of Associations: Loose  Orientation:  Full (Time, Place, and Person)  Thought Content:  Hallucinations: None  Suicidal Thoughts:  No  Homicidal Thoughts:  No  Memory:  Immediate;   Fair Recent;   Fair Remote;   Fair  Judgement:  Fair  Insight:  Fair  Psychomotor Activity:  Decreased  Concentration:  Concentration: Poor and Attention Span: Poor  Recall:  Poor  Fund of Knowledge:  Fair  Language:  Fair  Akathisia:  No  Handed:    AIMS (if indicated):     Assets:   Armed forces logistics/support/administrative officer Social Support  ADL's:  Intact  Cognition:  WNL  Sleep:  Number of Hours: 6.75     Treatment Plan Summary: Daily contact with patient to assess and evaluate symptoms and progress in treatment and Medication management   Autumn [name] is a 41 year old female with a history of bipolar disorder admitted for a psychotic break. She has been given various diagnoses, currently has a complicated pharmacological regimen. Overall for affective symptoms seem to be on the forefront and though she is on lithium and Tegretol, they seem to be suboptimally controlled. Her next lithium level will be done tomorrow and dose can be adjusted after the result. We will plan to increase the dose of Abilify to control evolving manic symptoms.   1. Bipolar disorder. We continue Tegretol, lithium and Abilify for mood stabilization and psychosis. She refused Abilify maintana injection. Abilify was increased to 30 mg and Lithium to 450 mg twice daily. She is allergic to Haldol injections. We will increase Zyprexa for paranoia.   2. EPS. Continue benztropine 0.5 mg q day. There is a history of dystonia with abilify.  3. Insomnia. Restoril 30 mg.  4. Amphetamines, opiates and marijuana use . The patient will be referred to intensive outpatient substance abuse upon discharge.   5. Tobacco use disorder. Nicotine patch is available.   6. Metabolic syndrome monitoring. Lipid panel, TSH and HgbA1C were completed on 06/27/2017.  7. EKG. Sinus tachycardia with PVCs, QTc 466.  8. Pregnancy test is negative.  9. Disposition. She will be discharged to home with her boyfriend. She will follow up with [facility].   Orson Slick, MD 07/14/2017, 8:24 AM

## 2017-07-14 NOTE — Plan of Care (Signed)
Problem: Education: Goal: Knowledge of the prescribed therapeutic regimen will improve Outcome: Progressing Patient has knowledge of prescribed medication regimen.

## 2017-07-14 NOTE — BHH Group Notes (Signed)
[facility] LCSW Group Therapy Note  Date/Time  September 6th,2018 9:30- to 10:15  Type of Therapy/Topic:  Group Therapy:  Balance in Life  Participation Level:    Description of Group:    This group will address the concept of balance and how it feels and looks when one is unbalanced. Patients will be encouraged to process areas in their lives that are out of balance, and identify reasons for remaining unbalanced. Facilitators will guide patients utilizing problem- solving interventions to address and correct the stressor making their life unbalanced. Understanding and applying boundaries will be explored and addressed for obtaining  and maintaining a balanced life. Patients will be encouraged to explore ways to assertively make their unbalanced needs known to significant others in their lives, using other group members and facilitator for support and feedback.  Therapeutic Goals: Patient will identify two or more emotions or situations they have that consume much of in their lives. Patient will identify signs/triggers that life has become out of balance:  Patient will identify two ways to set boundaries in order to achieve balance in their lives:  Patient will demonstrate ability to communicate their needs through discussion and/or role plays  Summary of Patient Progress: This patient was attentive to her peers and was able to follow group and was able to report that when her life was imbalanced she was not getting enough sleep. Her peers made good suggestions to assist with improving sleep patient was encouraged to talk to doctor and mental  health professionals.    Therapeutic Modalities:   Cognitive Behavioral Therapy Solution-Focused Therapy Assertiveness Training   Shaunae Sieloff LCSW

## 2017-07-14 NOTE — BHH Group Notes (Signed)
[place] Group Notes:  (Nursing/MHT/Case Management/Adjunct)  Date:  07/14/2017  Time:  2:03 PM  Type of Therapy:  Psychoeducational Skills  Participation Level:  Active  Participation Quality:  Attentive  Affect:  Appropriate  Cognitive:  Alert  Insight:  Limited  Engagement in Group:  Distracting and Autumn [name] appeared to be confused during group at times and made comments that were inappropriate. She made comments about chopping people up and spreading their bodies throughout the [place]. It was totally off subject matter and topic.   Modes of Intervention:  Discussion, Education and Exploration  Summary of Progress/Problems:  Alois Cliche 07/14/2017, 2:03 PM
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
## 2017-07-14 NOTE — BHH Group Notes (Signed)
[place] Group Notes:  (Nursing/MHT/Case Management/Adjunct)  Date:  07/14/2017  Time:  9:19 PM  Type of Therapy:  Group Therapy  Participation Level:  Active  Participation Quality:  Appropriate  Affect:  Appropriate  Cognitive:  Appropriate  Insight:  Good  Engagement in Group:  Engaged  Modes of Intervention:  Support  Summary of Progress/Problems:  Autumn [name] 07/14/2017, 9:19 PM

## 2017-07-14 NOTE — Progress Notes (Signed)
No psychotic episodes noted. Pt able to verbalize medications and use. Denies SI, HI, AVH. Did attend group. Thoughts less disorganized. Interacts appropriately with staff and peers. Encouragement and support offered. Safety checks maintained. Medications given as prescribed. Pt receptive and remains safe on unit with q 15 min checks.

## 2017-07-14 NOTE — BHH Group Notes (Signed)
Goals Group Date/Time:September 2PQ,[id number] 9-9:30 Type of Therapy and Topic: Group Therapy: Goals Group: SMART Goals   Participation Level: Moderate  Description of Group:    The purpose of a daily goals group is to assist and guide patients in setting recovery/wellness-related goals. The objective is to set goals as they relate to the crisis in which they were admitted. Patients will be using SMART goal modalities to set measurable goals. Characteristics of realistic goals will be discussed and patients will be assisted in setting and processing how one will reach their goal. Facilitator will also assist patients in applying interventions and coping skills learned in psycho-education groups to the SMART goal and process how one will achieve defined goal.   Therapeutic Goals:   -Patients will develop and document one goal related to or their crisis in which brought them into treatment.  -Patients will be guided by LCSW using SMART goal setting modality in how to set a measurable, attainable, realistic and time sensitive goal.  -Patients will process barriers in reaching goal.  -Patients will process interventions in how to overcome and successful in reaching goal.   Patient's Goal: [name], talk to nurses, [name]  Therapeutic Modalities:  Motivational Interviewing  Art gallery manager  SMART goals setting   Autumn Nary [place] LCSW

## 2017-07-15 MED ORDER — LITHIUM CITRATE 300 MG/5 ML PO SYRP
450.0000 mg | Freq: Three times a day (TID) | ORAL | Status: DC
  Administered 2017-07-15 – 2017-07-22 (×22): 450 mg via ORAL
  Filled 2017-07-15 (×23): qty 7.5

## 2017-07-15 NOTE — BHH Group Notes (Signed)
[place] Group Notes:  (Nursing/MHT/Case Management/Adjunct)  Date:  07/15/2017  Time:  10:05 AM  Type of Therapy:  Psychoeducational Skills  Participation Level:  Minimal  Participation Quality:  Resistant  Affect:  Not Congruent and Resistant  Cognitive:  Disorganized and Delusional  Insight:  Lacking and Limited  Engagement in Group:  Off Topic, Poor and Resistant  Modes of Intervention:  Discussion, Education and Support  Summary of Progress/Problems:  Autumn [name] 07/15/2017, 10:05 AM

## 2017-07-15 NOTE — Plan of Care (Signed)
Problem: Safety: Goal: Ability to remain free from injury will improve Outcome: Progressing Pt remains safe while in hospital injury free.    

## 2017-07-15 NOTE — BHH Suicide Risk Assessment (Signed)
[place] INPATIENT:  Family/Significant Other Suicide Prevention Education  Suicide Prevention Education:  Education Completed; [name], boyfriend, [phone], has been identified by the patient as the family member/significant other with whom the patient will be residing, and identified as the person(s) who will aid the patient in the event of a mental health crisis (suicidal ideations/suicide attempt).  With written consent from the patient, the family member/significant other has been provided the following suicide prevention education, prior to the and/or following the discharge of the patient.  The suicide prevention education provided includes the following:  Suicide risk factors  Suicide prevention and interventions  National Suicide Hotline telephone number  [facility] assessment telephone number  [facility] Emergency Assistance [place] and/or Residential Mobile Crisis Unit telephone number  Request made of family/significant other to:  Remove weapons (e.g., guns, rifles, knives), all items previously/currently identified as safety concern.  No guns in the home, per [place].  Remove drugs/medications (over-the-counter, prescriptions, illicit drugs), all items previously/currently identified as a safety concern. No excess medication in the home, per [place].  The family member/significant other verbalizes understanding of the suicide prevention education information provided.  The family member/significant other agrees to remove the items of safety concern listed above.  Broadus John has been in a relationship with pt for 16 years.  Pt has not been doing mental health follow up recently.  Pt issues started with paranoia at work.  They work for same company and pt would come to [employer] and tell him people were looking at her or trying to "get" her.  She also stopped sleeping.  In early August, pt called out from work 3 straight days and then  told Broadus John she was moving to [place] to live with an old boyfriend.  Pt changed her mind and realized this didn't make sense before acting on this.  After the last discharge from [facility], pt came home on a Friday and did not sleep the whole weekend, she just paced the floors and smoked cigarettes.  She told a friend she was afraid to go to sleep but did not say why.  Pt has been getting quite hostile towards Broadus John and also towards her mother and sister.  Says that they are trying to keep her in the hospital.  She also talks about people who have died--she told Broadus John that his father had been inpt with her at [facility] and Joseph's father has been dead for 20 years.  She also mentioned an uncle being at [facility] and the uncle is deceased.  Broadus John visited yesterday and had a good conversation with pt.  She apologized and seemed better.  Joanne Chars, LCSW 07/15/2017, 9:04 AM

## 2017-07-15 NOTE — Tx Team (Signed)
Interdisciplinary Treatment and Diagnostic Plan Update  07/15/2017 Time of Session: 11:00 AM Autumn [name] MRN: [id number]  Principal Diagnosis: Bipolar I disorder, most recent episode manic, severe without psychotic features ([place])  Secondary Diagnoses: Principal Problem:   Bipolar I disorder, most recent episode manic, severe without psychotic features ([place]) Active Problems:   Hx of glaucoma   Tobacco use disorder   Amphetamine use disorder, severe (HCC)   Opioid use disorder, severe, dependence ([place])   Cannabis use disorder, severe, dependence ([place])   Current Medications:  Current Facility-Administered Medications  Medication Dose Route Frequency Provider Last Rate Last Dose  . alum & mag hydroxide-simeth (MAALOX/MYLANTA) 200-200-20 MG/5ML suspension 30 mL  30 mL Oral Q4H PRN [name], [name], MD      . ARIPiprazole (ABILIFY) tablet 30 mg  30 mg Oral Daily Ramond Dial, MD   30 mg at 07/15/17 0824  . feeding supplement (ENSURE ENLIVE) (ENSURE ENLIVE) liquid 237 mL  237 mL Oral BID BM Hildred Priest, MD   237 mL at 07/15/17 1339  . lithium citrate 300 MG/5ML solution 450 mg  450 mg Oral TID [name], [name], MD   450 mg at 07/15/17 1139  . LORazepam (ATIVAN) tablet 2 mg  2 mg Oral Q4H PRN Ramond Dial, MD   2 mg at 07/14/17 2142  . magnesium hydroxide (MILK OF MAGNESIA) suspension 30 mL  30 mL Oral Daily PRN [name], [name], MD      . nicotine (NICODERM CQ - dosed in mg/24 hours) patch 21 mg  21 mg Transdermal Daily [name], [name], MD   21 mg at 07/15/17 0824  . OLANZapine (ZYPREXA) tablet 30 mg  30 mg Oral QHS [name], [name], MD   30 mg at 07/14/17 2142  . ondansetron (ZOFRAN-ODT) disintegrating tablet 4 mg  4 mg Oral Q8H PRN Hildred Priest, MD      . temazepam (RESTORIL) capsule 30 mg  30 mg Oral QHS [name], [name], MD   30 mg at 07/14/17 2142   PTA Medications: Prescriptions Prior to Admission   Medication Sig Dispense Refill Last Dose  . carbamazepine (TEGRETOL) 200 MG tablet Take 1 tablet (200 mg total) by mouth 2 (two) times daily. 60 tablet 0 07/04/2017 at am  . citalopram (CELEXA) 20 MG tablet Take 1 tablet (20 mg total) by mouth daily. 30 tablet 0 07/04/2017 at am  . clotrimazole (GYNE-LOTRIMIN 3) 2 % vaginal cream Place 1 Applicatorful vaginally at bedtime. 21 g 0 unknown at unknown  . lithium carbonate (LITHOBID) 300 MG CR tablet Take 1 tablet (300 mg total) by mouth every 12 (twelve) hours. 60 tablet 0 07/04/2017 at am  . traZODone (DESYREL) 150 MG tablet Take 1 tablet (150 mg total) by mouth at bedtime. 30 tablet 0 07/03/2017 at pm    Patient Stressors: Financial difficulties Medication change or noncompliance  Patient Strengths: Average or above average intelligence Capable of independent living Work skills  Treatment Modalities: Medication Management, Group therapy, Case management,  1 to 1 session with clinician, Psychoeducation, Recreational therapy.   Physician Treatment Plan for Primary Diagnosis: Bipolar I disorder, most recent episode manic, severe without psychotic features ([place]) Long Term Goal(s): Improvement in symptoms so as ready for discharge Improvement in symptoms so as ready for discharge   Short Term Goals: Ability to identify changes in lifestyle to reduce recurrence of condition will improve Ability to demonstrate self-control will improve Compliance with prescribed medications will improve Ability to identify changes in  lifestyle to reduce recurrence of condition will improve Ability to identify and develop effective coping behaviors will improve  Medication Management: Evaluate patient's response, side effects, and tolerance of medication regimen.  Therapeutic Interventions: 1 to 1 sessions, Unit Group sessions and Medication administration.  Evaluation of Outcomes: Progressing  Physician Treatment Plan for Secondary Diagnosis: Principal  Problem:   Bipolar I disorder, most recent episode manic, severe without psychotic features ([place]) Active Problems:   Hx of glaucoma   Tobacco use disorder   Amphetamine use disorder, severe (HCC)   Opioid use disorder, severe, dependence ([place])   Cannabis use disorder, severe, dependence ([place])  Long Term Goal(s): Improvement in symptoms so as ready for discharge Improvement in symptoms so as ready for discharge   Short Term Goals: Ability to identify changes in lifestyle to reduce recurrence of condition will improve Ability to demonstrate self-control will improve Compliance with prescribed medications will improve Ability to identify changes in lifestyle to reduce recurrence of condition will improve Ability to identify and develop effective coping behaviors will improve     Medication Management: Evaluate patient's response, side effects, and tolerance of medication regimen.  Therapeutic Interventions: 1 to 1 sessions, Unit Group sessions and Medication administration.  Evaluation of Outcomes: Progressing   RN Treatment Plan for Primary Diagnosis: Bipolar I disorder, most recent episode manic, severe without psychotic features ([place]) Long Term Goal(s): Knowledge of disease and therapeutic regimen to maintain health will improve  Short Term Goals: Ability to demonstrate self-control, Ability to verbalize feelings will improve and Compliance with prescribed medications will improve  Medication Management: RN will administer medications as ordered by provider, will assess and evaluate patient's response and provide education to patient for prescribed medication. RN will report any adverse and/or side effects to prescribing provider.  Therapeutic Interventions: 1 on 1 counseling sessions, Psychoeducation, Medication administration, Evaluate responses to treatment, Monitor vital signs and CBGs as ordered, Perform/monitor CIWA, COWS, AIMS and Fall Risk screenings as ordered, Perform wound  care treatments as ordered.  Evaluation of Outcomes: Progressing   LCSW Treatment Plan for Primary Diagnosis: Bipolar I disorder, most recent episode manic, severe without psychotic features ([place]) Long Term Goal(s): Safe transition to appropriate next level of care at discharge, Engage patient in therapeutic group addressing interpersonal concerns.  Short Term Goals: Engage patient in aftercare planning with referrals and resources, Increase social support and Identify triggers associated with mental health/substance abuse issues  Therapeutic Interventions: Assess for all discharge needs, 1 to 1 time with Social worker, Explore available resources and support systems, Assess for adequacy in community support network, Educate family and significant other(s) on suicide prevention, Complete Psychosocial Assessment, Interpersonal group therapy.  Evaluation of Outcomes: Progressing   Progress in Treatment: Attending groups: Yes. Participating in groups: Yes. Taking medication as prescribed: Yes. Toleration medication: Yes. Family/Significant other contact made: No, will contact:  CSW assessing appropriate contact. Patient understands diagnosis: Yes. Discussing patient identified problems/goals with staff: Yes. Medical problems stabilized or resolved: Yes. Denies suicidal/homicidal ideation: Yes. Issues/concerns per patient self-inventory: No.  New problem(s) identified: No, Describe:  None.  New Short Term/Long Term Goal(s): Patient stated that her goal is to take better care of her health.  Discharge Plan or Barriers: Patient agreeable to follow up with [facility]. Patient plans to discharge back with her husband.  Reason for Continuation of Hospitalization: Anxiety Mania Other; describe paranoia   Attempted to elope on 07/11/17  9/7:  Autumn [name] has been rather disorganized today and frightened her peers  during group meeting when she warned everybody that if they go to the mall, they will be  abducted, killed, and cut into pieces that will be send to all 50 states. She takes medications but her lithium level was lower after dose increase. I switched to liquid Lithium.    Estimated Length of Stay: 3-5 days   Attendees: Patient: not present 07/15/2017 3:21 PM  Physician: Dr. Duanne Guess, MD  07/15/2017 3:21 PM  Nursing: 07/15/2017 3:21 PM  RN Care Manager: 07/15/2017 3:21 PM  Social Worker:  Vidal Schwalbe, [place] 07/15/2017 3:21 PM  Recreational Therapist:  07/15/2017 3:21 PM  Other:  07/15/2017 3:21 PM  Other:  07/15/2017 3:21 PM  Other: 07/15/2017 3:21 PM    Scribe for Treatment Team: Lilly Cove, LCSW 07/15/2017 3:21 PM

## 2017-07-15 NOTE — Progress Notes (Signed)
Pt still remains a little anxious. Pt encouraged to use relaxation method for anxiety. Pt verbalized using deep breathing techniques. Pt affect is flat. Pt depression is 3/10. Pt denies SI, HI or A/V hallucinations. Pt is med compliant, no adverse affects noted. Pt contracted to safety. 15 min safety checks will continue.

## 2017-07-15 NOTE — BHH Group Notes (Signed)
LCSW Group Therapy Note  07/15/2017 2:06 PM    Type of Therapy and Topic:  Group Therapy:  Who Am I?  Self Esteem, Self-Actualization and Understanding Self.    Participation Level:  Minimal  Description of Group:   In this group patients will be asked to explore values, beliefs, truths, and morals as they relate to personal self.  Patients will be guided to discuss their thoughts, feelings, and behaviors related to what they identify as important to their true self. Patients will process together how values, beliefs and truths are connected to specific choices patients make every day. Each patient will be challenged to identify changes that they are motivated to make in order to improve self-esteem and self-actualization. This group will be process-oriented, with patients participating in exploration of their own experiences, giving and receiving support, and processing challenge from other group members.   Therapeutic Goals: 1. Patient will identify false beliefs that currently interfere with their self-esteem.  2. Patient will identify feelings, thought process, and behaviors related to self and will become aware of the uniqueness of themselves and of others.  3. Patient will be able to identify and verbalize values, morals, and beliefs as they relate to self. 4. Patient will begin to learn how to build self-esteem/self-awareness by expressing what is important and unique to them personally.   Summary of Patient Progress  Autumn [name] started out group positive and engaged. She reports before group starts that her medicine has been really hard on her and she appears tired and dishevled.  She reports in group that she is good at life guarding.  When LCSW attempted to process with patient more about this skill she reports she has not been swimming all season as her boyfriend did mention it.  During group she was visibility getting agitated and upset as she asked a student to move away from her and  breathing loudly.  No one was talking with her nor pushing her to engage, but she became agitated and quickly stated she must leave and did not return to group.   She briefly mentioned her past and negative situations that has impacted her belief of self, but refused to ellaborate.  LCSW assumes thoughts regarding her past may have triggered her to become agitated, but this is speculation and unknown.     Therapeutic Modalities:   Cognitive Behavioral Therapy Solution Focused Therapy Motivational Interviewing Brief Therapy   Autumn Cove, LCSW 07/15/2017 2:06 PM

## 2017-07-15 NOTE — Progress Notes (Signed)
Pt appears to be less disorganized. Pt denies current SI, HI, a/v hallucinations. She seems to interact well with staff. Pt compliant with all medications. No issues noted at this time. Will continue to monitor.

## 2017-07-15 NOTE — Progress Notes (Signed)
D:Pt denies SI/HI/AVH, but noted responding to internal stimuli. Patient's affect is labile, she appears to have some confusion this evening to place and situation, and was frequently redirected. Patient's mood is irritable and was seen earlier getting angry at another patient on the unit. Patient is cooperative with treatment plan, her thoughts are less disorganized, speech is non pressured and coherent. Patient appeared anxious and was medicated for anxiety during the shift.  A:Pt was offered support and encouragement. Pt was given scheduled medications and PRN needed. Pt was encouraged to attend groups. Q 15 minute checks were done for safety.  R:Patient attends groups and interacted well with peers and staff. Pt is complaint with medication. Patient isreceptive to treatment and safety maintained on unit.

## 2017-07-15 NOTE — Plan of Care (Signed)
Problem: Self-Concept: Goal: Level of anxiety will decrease Outcome: Progressing Pt level of anxiety will decrease this shift.

## 2017-07-15 NOTE — Progress Notes (Signed)
[facility] MD Progress Note  07/15/2017 8:48 AM [name]  MRN:  [id number] Subjective:   Ms. Howdyshell is a 41 year old separated Caucasian female from [place]. This patient carries a diagnosis of bipolar disorder type and substance abuse. She presented to the emergency department on the involuntary commitment for erratic behavior In the [place] parking lot, she was knocking on people's windows and running around the parking lot.  07/13/2017. Ms. Killion remains paranoid, delusional and fearful. She was very tearful today anticipating something terrible to happen. She has been arguing with her boyfriend on the phone when he is unwilling to discuss her paranoid ideas to the point that she no longer wants to be with him. She does not want injectable antipsychotic because she lost period for 3 months after Invega sustenna injection. This is not impossible due to PRL increase. She did take haldol last night. There are no somatic complaints. She slept better last night.  07/14/2017. [name] is still very paranoid. She broke up with her boyfriend of 17 years this morning on the phone because he "does not believe what I say and tries to keep me in the hospital". She tolerates new medications well. Her belongings are no longer packed as they were when she anticipated apocalypse. She seems slightly more relaxed. There are no somatic complaints. She did not go to group today. Reports good sleep last night.  07/15/2017. Ms. Betty has been rather disorganized today and frightened her peers during group meeting when she warned everybody that if they go to the mall, they will be abducted, killed, and cut into pieces that will be send to all 75 states. She takes medications but her lithium level was lower after dose increase. I switched to liquid Lithium.   Per nursing:  D:Pt denies SI/HI/AVH, but noted responding to internal stimuli. Patient's affect is labile, she appears to have some confusion this  evening to place and situation, and was frequently redirected. Patient's mood is irritable and was seen earlier getting angry at another patient on the unit. Patient is cooperative with treatment plan, her thoughts are less disorganized, speech is non pressured and coherent. Patient appeared anxious and was medicated for anxiety during the shift.  A:Pt was offered support and encouragement. Pt was given scheduled medications and PRN needed. Pt was encouraged to attend groups. Q 15 minute checks were done for safety.  R:Patient attends groups and interacted well with peers and staff. Pt is complaint with medication. Patient isreceptive to treatment and safety maintained on unit.  Principal Problem: Bipolar I disorder, most recent episode manic, severe without psychotic features ([place]) Diagnosis:   Patient Active Problem List   Diagnosis Date Noted  . Bipolar I disorder, most recent episode manic, severe without psychotic features ([place]) [F31.13] 07/05/2017  . Tobacco use disorder [F17.200] 06/27/2017  . 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 Opioid use disorder, severe, dependence ([place]) [F11.20] 06/27/2017  . Cannabis use disorder, severe, dependence ([place]) [F12.20] 06/27/2017  . Hx of glaucoma [Z86.69] 10/02/2013   Total Time spent with patient: 30 minutes  Past Psychiatric History: Patient has been diagnosed with schizoaffective disorder, bipolar disorder, borderline personality disorder, depression and generalized anxiety disorder. She is being hospitalized here in our unit a multitude of times. She also has been [facility] at least twice. Patient says she has attempted to walk in front of cars in the past. She does report history of self injury by cutting but has not engaged  in this in years  She was just discharged from our unit on August 24. She returned to the ER manic again on August 27.  Past Medical History:  Past Medical History:  Diagnosis  Date  . Allergy    Seasonal, Ultram (seizures)  . Anemia 2005  . Bipolar 1 disorder ([place])   . Glaucoma   . Seizures ([place]) 2008    Past Surgical History:  Procedure Laterality Date  . BREAST BIOPSY Right    x 2  . CESAREAN SECTION     x 2   Family History:  Family History  Problem Relation Age of Onset  . Hypertension Mother   . Hyperlipidemia Mother   . Parkinson's disease Father    Family Psychiatric  History: denies  Social History:  History  Alcohol Use No    Comment: Ocassional     History  Drug Use    Comment: percocet -last use over 1 week ago    Social History   Social History  . Marital status: Single    Spouse name: N/A  . Number of children: N/A  . Years of education: N/A   Social History Main Topics  . Smoking status: Current Every Day Smoker    Packs/day: 1.00    Years: 9.00  . Smokeless tobacco: Never Used     Comment: Patient not interested  . Alcohol use No     Comment: Ocassional  . Drug use: Yes     Comment: percocet -last use over 1 week ago  . Sexual activity: No   Other Topics Concern  . None   Social History Narrative  . None     Current Medications: Current Facility-Administered Medications  Medication Dose Route Frequency Provider Last Rate Last Dose  . alum & mag hydroxide-simeth (MAALOX/MYLANTA) 200-200-20 MG/5ML suspension 30 mL  30 mL Oral Q4H PRN Vondra Aldredge B, MD      . ARIPiprazole (ABILIFY) tablet 30 mg  30 mg Oral Daily Ramond Dial, MD   30 mg at 07/15/17 0824  . carbamazepine (TEGRETOL) tablet 200 mg  200 mg Oral BID AC & HS Brytney Somes B, MD   200 mg at 07/15/17 0826  . feeding supplement (ENSURE ENLIVE) (ENSURE ENLIVE) liquid 237 mL  237 mL Oral BID BM Hildred Priest, MD   237 mL at 07/14/17 1400  . lithium carbonate (ESKALITH) CR tablet 450 mg  450 mg Oral Q12H Hildred Priest, MD   450 mg at 07/15/17 0824  . LORazepam (ATIVAN) tablet 2 mg  2 mg Oral Q4H PRN Ramond Dial, MD   2 mg at 07/14/17 2142  . magnesium hydroxide (MILK OF MAGNESIA) suspension 30 mL  30 mL Oral Daily PRN Chester Romero B, MD      . nicotine (NICODERM CQ - dosed in mg/24 hours) patch 21 mg  21 mg Transdermal Daily Aashi Derrington B, MD   21 mg at 07/15/17 0824  . OLANZapine (ZYPREXA) tablet 30 mg  30 mg Oral QHS Quintasha Gren B, MD   30 mg at 07/14/17 2142  . ondansetron (ZOFRAN-ODT) disintegrating tablet 4 mg  4 mg Oral Q8H PRN Hildred Priest, MD      . temazepam (RESTORIL) capsule 30 mg  30 mg Oral QHS Sorrel Cassetta B, MD   30 mg at 07/14/17 2142    Lab Results:  No results found for this or any previous visit (from the past 48 hour(s)).  Blood Alcohol level:  Lab Results  Component Value Date   ETH <5 07/04/2017   ETH 18 (H) 40/08/2724    Metabolic Disorder Labs: Lab Results  Component Value Date   HGBA1C 5.0 06/27/2017   MPG 96.8 06/27/2017   No results found for: PROLACTIN Lab Results  Component Value Date   CHOL 152 06/27/2017   TRIG 68 06/27/2017   HDL 59 06/27/2017   CHOLHDL 2.6 06/27/2017   VLDL 14 06/27/2017   LDLCALC 79 06/27/2017   LDLCALC 97 10/02/2013    Physical Findings: AIMS:  , ,  ,  ,    CIWA:    COWS:     Musculoskeletal: Strength & Muscle Tone: within normal limits Gait & Station: normal Patient leans: N/A  Psychiatric Specialty Exam: Physical Exam  Nursing note and vitals reviewed. Musculoskeletal: Normal range of motion.  Psychiatric: Her speech is normal and behavior is normal. Her affect is blunt. Thought content is paranoid and delusional. Cognition and memory are normal. She expresses impulsivity.    Review of Systems  Psychiatric/Behavioral: Positive for depression and substance abuse.  All other systems reviewed and are negative.   Blood pressure 112/74, pulse 81, temperature 98 F (36.7 C), temperature source Oral, resp. rate 18, height 5\' 1"  (1.549 m), weight 38.1 kg (84 lb), SpO2  100 %.Body mass index is 15.87 kg/m.  General Appearance: Disheveled  Eye Contact:  Fair  Speech:  Slow  Volume:  Decreased  Mood:  Dysphoric  Affect: labile  Thought Process:  Linear and Descriptions of Associations: Loose  Orientation:  Full (Time, Place, and Person)  Thought Content:  Hallucinations: None  Suicidal Thoughts:  No  Homicidal Thoughts:  No  Memory:  Immediate;   Fair Recent;   Fair Remote;   Fair  Judgement:  Fair  Insight:  Fair  Psychomotor Activity:  Decreased  Concentration:  Concentration: Poor and Attention Span: Poor  Recall:  Poor  Fund of Knowledge:  Fair  Language:  Fair  Akathisia:  No  Handed:    AIMS (if indicated):     Assets:  Armed forces logistics/support/administrative officer Social Support  ADL's:  Intact  Cognition:  WNL  Sleep:  Number of Hours: 6.15     Treatment Plan Summary: Daily contact with patient to assess and evaluate symptoms and progress in treatment and Medication management   Ms. Waldo is a 41 year old female with a history of bipolar disorder admitted for a psychotic break. She has been given various diagnoses, currently has a complicated pharmacological regimen. Overall for affective symptoms seem to be on the forefront and though she is on lithium and Tegretol, they seem to be suboptimally controlled. Her next lithium level will be done tomorrow and dose can be adjusted after the result. We will plan to increase the dose of Abilify to control evolving manic symptoms.   1. Bipolar disorder. We continue Tegretol, lithium, Zyprexa and Abilify for mood stabilization and psychosis. Switch Lithium CR to liquid.    2. EPS. Continue benztropine 0.5 mg q day. There is a history of dystonia with abilify.  3. Insomnia. Restoril 30 mg.  4. Amphetamines, opiates and marijuana use . The patient will be referred to intensive outpatient substance abuse upon discharge.   5. Tobacco use disorder. Nicotine patch is available.   6. Metabolic syndrome  monitoring. Lipid panel, TSH and HgbA1C were completed on 06/27/2017.  7. EKG. Sinus tachycardia with PVCs, QTc 466.  8. Pregnancy test is negative.  9. Disposition. She will be discharged to home with  her boyfriend. She will follow up with [facility].   Orson Slick, MD 07/15/2017, 8:48 AM

## 2017-07-15 NOTE — BHH Group Notes (Signed)
[facility] Group Notes:  (Nursing/MHT/Case Management/Adjunct)  Date:  07/15/2017  Time:  10:00 PM  Type of Therapy:  Evening Wrap-up Group  Participation Level:  Active  Participation Quality:  Appropriate and Attentive  Affect:  Appropriate  Cognitive:  Alert and Appropriate  Insight:  Appropriate and Good  Engagement in Group:  Developing/Improving and Engaged  Modes of Intervention:  Discussion  Summary of Progress/Problems:  Autumn [name] 07/15/2017, 10:00 PM

## 2017-07-16 MED ORDER — TRAZODONE HCL 100 MG PO TABS
100.0000 mg | ORAL_TABLET | Freq: Every day | ORAL | Status: DC
  Administered 2017-07-16: 100 mg via ORAL
  Filled 2017-07-16: qty 1

## 2017-07-16 MED ORDER — ARIPIPRAZOLE 10 MG PO TABS: 20.0000 mg | ORAL_TABLET | Freq: Every day | ORAL | Status: DC

## 2017-07-16 MED ORDER — ARIPIPRAZOLE 10 MG PO TABS
10.0000 mg | ORAL_TABLET | Freq: Every day | ORAL | Status: DC
  Administered 2017-07-17: 10 mg via ORAL
  Filled 2017-07-16: qty 1

## 2017-07-16 MED ORDER — LORAZEPAM 2 MG PO TABS
2.0000 mg | ORAL_TABLET | Freq: Four times a day (QID) | ORAL | Status: DC | PRN
  Administered 2017-07-16 – 2017-07-24 (×6): 2 mg via ORAL
  Filled 2017-07-16 (×6): qty 1

## 2017-07-16 NOTE — Progress Notes (Signed)
Pt got very agitated banging on nurses station demanding tape pt stated " I need tape because I think I cracked my kneecap!". Pt seen trembling at this time. Pt given prn ativan and advised to rest in room.

## 2017-07-16 NOTE — BHH Group Notes (Signed)
[facility] [place] Group Notes:  (Nursing/MHT/Case Management/Adjunct)  Date:  07/16/2017  Time:  9:49 PM  Type of Therapy:  Group Therapy  Participation Level:  Active  Participation Quality:  Appropriate  Affect:  Appropriate  Cognitive:  Appropriate  Insight:  Good  Engagement in Group:  Supportive  Modes of Intervention:  Support  Summary of Progress/Problems:  Autumn [name] 07/16/2017, 9:49 PM

## 2017-07-16 NOTE — BHH Group Notes (Signed)
[place] LCSW Group Therapy 07/16/2017 1:15pm  Type of Therapy: Group Therapy- Feelings Around Discharge & Establishing a Supportive Framework  Participation Level:  Active  Description of Group:   What is a supportive framework? What does it look like feel like and how do I discern it from and unhealthy non-supportive network? Learn how to cope when supports are not helpful and don't support you. Discuss what to do when your family/friends are not supportive.  Summary of Patient Progress Pt participated actively but was distracting at times, laughing inappropriately and engaging in side conversations. Pt reports she would like to stop "participating in my friends' dares" and work on communicating with her support network. Pt's goal for improving her support network was identified as "asking a friend to take me to the store."  Therapeutic Modalities:   Cognitive [place], LCSW 07/16/2017 1:40 PM

## 2017-07-16 NOTE — Progress Notes (Signed)
Medication effective pt calm at this time.

## 2017-07-16 NOTE — Plan of Care (Signed)
Problem: Safety: Goal: Ability to remain free from injury will improve Outcome: Progressing Pt remains safe while in hospital injury free.    

## 2017-07-16 NOTE — Progress Notes (Addendum)
[facility] MD Progress Note  07/16/2017 7:48 AM [name]  MRN:  [id number] Subjective:   Ms. Purifoy is a 41 year old separated Caucasian female from [place]. This patient carries a diagnosis of bipolar disorder type and substance abuse. She presented to the emergency department on the involuntary commitment for erratic behavior In the [place] parking lot, she was knocking on people's windows and running around the parking lot.  07/13/2017. Ms. Manocchio remains paranoid, delusional and fearful. She was very tearful today anticipating something terrible to happen. She has been arguing with her boyfriend on the phone when he is unwilling to discuss her paranoid ideas to the point that she no longer wants to be with him. She does not want injectable antipsychotic because she lost period for 3 months after Invega sustenna injection. This is not impossible due to PRL increase. She did take haldol last night. There are no somatic complaints. She slept better last night.  07/14/2017. [name] is still very paranoid. She broke up with her boyfriend of 17 years this morning on the phone because he "does not believe what I say and tries to keep me in the hospital". She tolerates new medications well. Her belongings are no longer packed as they were when she anticipated apocalypse. She seems slightly more relaxed. There are no somatic complaints. She did not go to group today. Reports good sleep last night.  07/15/2017. Ms. Kang has been rather disorganized today and frightened her peers during group meeting when she warned everybody that if they go to the mall, they will be abducted, killed, and cut into pieces that will be send to all 20 states. She takes medications but her lithium level was lower after dose increase. I switched to liquid Lithium.   07/16/2017. Ms. Prindle reports feeling better today but she slept 3 hours last night only and reportedly wondered into another patient;s room last night.  She denies hallucinations but still paranoid and frightened. She takes medications as prescribed. No side effects. No somatic complaints.   Per nursing:  Pt still remains a little anxious. Pt encouraged to use relaxation method for anxiety. Pt verbalized using deep breathing techniques. Pt affect is flat. Pt depression is 3/10. Pt denies SI, HI or A/V hallucinations. Pt is med compliant, no adverse affects noted. Pt contracted to safety. 15 min safety checks will continue.  Principal Problem: Bipolar I disorder, most recent episode manic, severe without psychotic features ([place]) Diagnosis:   Patient Active Problem List   Diagnosis Date Noted  . Bipolar I disorder, most recent episode manic, severe without psychotic features ([place]) [F31.13] 07/05/2017  . Tobacco use disorder [F17.200] 06/27/2017  . Amphetamine use disorder, severe ( [place]) [F15.20] 06/27/2017  . Opioid use disorder, severe, dependence ([place]) [F11.20] 06/27/2017  . Cannabis use disorder, severe, dependence ([place]) [F12.20] 06/27/2017  . Hx of glaucoma [Z86.69] 10/02/2013   Total Time spent with patient: 30 minutes  Past Psychiatric History: Patient has been diagnosed with schizoaffective disorder, bipolar disorder, borderline personality disorder, depression and generalized anxiety disorder. She is being hospitalized here in our unit a multitude of times. She also has been [facility] at least twice. Patient says she has attempted to walk in front of cars in the past. She does report history of self injury by cutting but has not engaged in this in years  She was just discharged from our unit on August 24. She returned to the ER manic again on August 27.  Past Medical History:  Past Medical History:  Diagnosis Date  . Allergy    Seasonal, Ultram (seizures)  . Anemia 2005  . Bipolar 1 disorder ([place])   . Glaucoma   . Seizures ([place]) 2008    Past Surgical History:  Procedure Laterality Date  . BREAST BIOPSY Right     x 2  . CESAREAN SECTION     x 2   Family History:  Family History  Problem Relation Age of Onset  . Hypertension Mother   . Hyperlipidemia Mother   . Parkinson's disease Father    Family Psychiatric  History: denies  Social History:  History  Alcohol Use No    Comment: Ocassional     History  Drug Use    Comment: percocet -last use over 1 week ago    Social History   Social History  . Marital status: Single    Spouse name: N/A  . Number of children: N/A  . Years of education: N/A   Social History Main Topics  . Smoking status: Current Every Day Smoker    Packs/day: 1.00    Years: 9.00  . Smokeless tobacco: Never Used     Comment: Patient not interested  . Alcohol use No     Comment: Ocassional  . Drug use: Yes     Comment: percocet -last use over 1 week ago  . Sexual activity: No   Other Topics Concern  . None   Social History Narrative  . None     Current Medications: Current Facility-Administered Medications  Medication Dose Route Frequency Provider Last Rate Last Dose  . alum & mag hydroxide-simeth (MAALOX/MYLANTA) 200-200-20 MG/5ML suspension 30 mL  30 mL Oral Q4H PRN Shayaan Parke B, MD      . ARIPiprazole (ABILIFY) tablet 30 mg  30 mg Oral Daily Ramond Dial, MD   30 mg at 07/15/17 0824  . feeding supplement (ENSURE ENLIVE) (ENSURE ENLIVE) liquid 237 mL  237 mL Oral BID BM Hildred Priest, MD   237 mL at 07/15/17 1339  . lithium citrate 300 MG/5ML solution 450 mg  450 mg Oral TID Kerston Landeck B, MD   450 mg at 07/15/17 1816  . LORazepam (ATIVAN) tablet 2 mg  2 mg Oral Q4H PRN Ramond Dial, MD   2 mg at 07/14/17 2142  . magnesium hydroxide (MILK OF MAGNESIA) suspension 30 mL  30 mL Oral Daily PRN Brookelynne Dimperio B, MD      . nicotine (NICODERM CQ - dosed in mg/24 hours) patch 21 mg  21 mg Transdermal Daily Jazmin Ley B, MD   21 mg at 07/15/17 0824  . OLANZapine (ZYPREXA) tablet 30 mg  30 mg Oral QHS  Tiwatope Emmitt B, MD   30 mg at 07/15/17 2112  . ondansetron (ZOFRAN-ODT) disintegrating tablet 4 mg  4 mg Oral Q8H PRN Hildred Priest, MD      . temazepam (RESTORIL) capsule 30 mg  30 mg Oral QHS Kamari Buch B, MD   30 mg at 07/15/17 2112    Lab Results:  No results found for this or any previous visit (from the past 48 hour(s)).  Blood Alcohol level:  Lab Results  Component Value Date   ETH <5 07/04/2017   ETH 18 (H) 62/94/7654    Metabolic Disorder Labs: Lab Results  Component Value Date   HGBA1C 5.0 06/27/2017   MPG 96.8 06/27/2017   No results found for: PROLACTIN Lab Results  Component Value Date   CHOL 152 06/27/2017  TRIG 68 06/27/2017   HDL 59 06/27/2017   CHOLHDL 2.6 06/27/2017   VLDL 14 06/27/2017   LDLCALC 79 06/27/2017   LDLCALC 97 10/02/2013    Physical Findings: AIMS:  , ,  ,  ,    CIWA:    COWS:     Musculoskeletal: Strength & Muscle Tone: within normal limits Gait & Station: normal Patient leans: N/A  Psychiatric Specialty Exam: Physical Exam  Nursing note and vitals reviewed. Musculoskeletal: Normal range of motion.  Psychiatric: Her speech is normal and behavior is normal. Her affect is blunt. Thought content is paranoid and delusional. Cognition and memory are normal. She expresses impulsivity.    Review of Systems  Psychiatric/Behavioral: Positive for depression and substance abuse.  All other systems reviewed and are negative.   Blood pressure 109/62, pulse 82, temperature 98.1 F (36.7 C), temperature source Oral, resp. rate 18, height 5\' 1"  (1.549 m), weight 38.1 kg (84 lb), SpO2 100 %.Body mass index is 15.87 kg/m.  General Appearance: Disheveled  Eye Contact:  Fair  Speech:  Slow  Volume:  Decreased  Mood:  Dysphoric  Affect: labile  Thought Process:  Linear and Descriptions of Associations: Loose  Orientation:  Full (Time, Place, and Person)  Thought Content:  Hallucinations: None  Suicidal Thoughts:   No  Homicidal Thoughts:  No  Memory:  Immediate;   Fair Recent;   Fair Remote;   Fair  Judgement:  Fair  Insight:  Fair  Psychomotor Activity:  Decreased  Concentration:  Concentration: Poor and Attention Span: Poor  Recall:  Poor  Fund of Knowledge:  Fair  Language:  Fair  Akathisia:  No  Handed:    AIMS (if indicated):     Assets:  Armed forces logistics/support/administrative officer Social Support  ADL's:  Intact  Cognition:  WNL  Sleep:  Number of Hours: 3.45     Treatment Plan Summary: Daily contact with patient to assess and evaluate symptoms and progress in treatment and Medication management   Ms. Hibbard is a 41 year old female with a history of bipolar disorder admitted for a psychotic break. She has been given various diagnoses, currently has a complicated pharmacological regimen. Overall for affective symptoms seem to be on the forefront and though she is on lithium and Tegretol, they seem to be suboptimally controlled. Her next lithium level will be done tomorrow and dose can be adjusted after the result. We will plan to increase the dose of Abilify to control evolving manic symptoms.   1. Bipolar disorder. We continue Tegretol, lithium, Zyprexa and Abilify for mood stabilization and psychosis. Switch Lithium CR to liquid.  I will start tapering of Abilify.  2. EPS. Continue benztropine 0.5 mg q day. There is a history of dystonia with abilify.  3. Insomnia. Restoril 30 mg. We will add Trazodone.  4. Amphetamines, opiates and marijuana use . The patient will be referred to intensive outpatient substance abuse upon discharge.   5. Tobacco use disorder. Nicotine patch is available.   6. Metabolic syndrome monitoring. Lipid panel, TSH and HgbA1C were completed on 06/27/2017.  7. EKG. Sinus tachycardia with PVCs, QTc 466.  8. Pregnancy test is negative.  9. Disposition. She will be discharged to home with her boyfriend. She will follow up with [facility].   I certify that the services received  since the previous certification/recertification were and continue to be medically necessary as the treatment provided can be reasonably expected to improve the patient's condition; the medical record documents that the services  furnished were intensive treatment services or their equivalent services, and this patient continues to need, on a daily basis, active treatment furnished directly by or requiring the supervision of inpatient psychiatric personnel.   Orson Slick, MD 07/16/2017, 7:48 AM

## 2017-07-16 NOTE — Progress Notes (Signed)
Pt denies SI, HI, a/v hallucinations. However, pt is better but remains disorganized and confused. She is compliant with all medications and has pleasant mood. Pt is visible on unit and meal complaint. Will continue to monitor.

## 2017-07-17 MED ORDER — DIVALPROEX SODIUM 500 MG PO DR TAB
500.0000 mg | DELAYED_RELEASE_TABLET | Freq: Three times a day (TID) | ORAL | Status: DC
  Administered 2017-07-17 – 2017-07-29 (×36): 500 mg via ORAL
  Filled 2017-07-17 (×36): qty 1

## 2017-07-17 MED ORDER — ARIPIPRAZOLE 10 MG PO TABS
30.0000 mg | ORAL_TABLET | Freq: Every day | ORAL | Status: DC
  Administered 2017-07-18 – 2017-07-29 (×12): 30 mg via ORAL
  Filled 2017-07-17 (×12): qty 3

## 2017-07-17 MED ORDER — TRAZODONE HCL 100 MG PO TABS
200.0000 mg | ORAL_TABLET | Freq: Every day | ORAL | Status: DC
  Administered 2017-07-17 – 2017-07-23 (×7): 200 mg via ORAL
  Filled 2017-07-17 (×7): qty 2

## 2017-07-17 MED ORDER — ARIPIPRAZOLE 10 MG PO TABS
20.0000 mg | ORAL_TABLET | Freq: Once | ORAL | Status: AC
  Administered 2017-07-17: 20 mg via ORAL
  Filled 2017-07-17: qty 2

## 2017-07-17 NOTE — BHH Group Notes (Signed)
[facility] Group Notes:  (Nursing/MHT/Case Management/Adjunct)  Date:  07/17/2017  Time:  10:18 PM  Type of Therapy:  Group Therapy  Participation Level:  Active  Participation Quality:  Appropriate  Affect:  Appropriate  Cognitive:  Appropriate  Insight:  Appropriate  Engagement in Group:  Engaged  Modes of Intervention:  Discussion  Summary of Progress/Problems:  Kandis Fantasia 07/17/2017, 10:18 PM

## 2017-07-17 NOTE — Progress Notes (Signed)
Patient has been up 5 times since midnight and reports vague,somatic complaints or "bad dreams".  Each time she has been approached and redirected toward her room after allowing her time to voice her concerns.  She is more somatic focused at 0500 and reports stomach upset from "not drinking enough water" and "being upset because I woke with a different pair of underwear on".  She requested "something for my nerves to un-nerve me". Patient was given Ativan 2 mg, po.

## 2017-07-17 NOTE — Plan of Care (Signed)
Problem: Safety: Goal: Ability to disclose and discuss suicidal ideas will improve Outcome: Progressing Pt remains safe while in hospital injury free.

## 2017-07-17 NOTE — Progress Notes (Signed)
Pt given prn ativan 2 mg for agitation.Pt stated," My own mind scares me".  She appeared to be very anxious. She is very paranoid and easily agitated.  Pt compliant with increased dose of Abilify. She is also compliant with her Depakote and Lithium. Will continue to monitor.

## 2017-07-17 NOTE — Plan of Care (Signed)
Problem: Safety: Goal: Ability to remain free from injury will improve Outcome: Progressing Patient was accompanied when walking in the hall and given support for self care activities.  Her response was to focus on "things that will hurt me because of the bad I have done".  She remains disorganized and pressured but denies SI,AVH.  She displays somatic delusions.  Patient is observed every 15 minutes and safety is maintained.  Problem: Coping: Goal: Ability to identify and develop effective coping behavior will improve Outcome: Not Progressing On approach at the beginning of this shift, Patient reported to writer "that I have a traumatic injury to my left knee and I can't walk" (as she stood in the hall).  She showed the left knee which was covered with scotch tape and the left toes which were wrapped in scotch tape.  Patient stated, "this is what you do for sports injuries and I want to take care of myself".  She was given attention and support.  She was encouraged to remove the tape before bed and she agreed.  We reviewed her medications and she agreed to stay in the dayroom until 2300 so she could sleep tonight(instead of going to bed at 2100).

## 2017-07-17 NOTE — Progress Notes (Signed)
Patient ID: Autumn [name], female   DOB: 1976/02/12, 41 y.o.   MRN: [id number] LCSW Group Therapy Note  07/17/2017 1 pm  Type of Therapy and Topic:  Group Therapy:  Cognitive Distortions  Participation Level:  Active   Description of Group:    Patients in this group will be introduced to the topic of cognitive distortions.  Patients will identify and describe cognitive distortions, describe the feelings these distortions create for them.  Patients will identify one or more situations in their personal life where they have cognitively distorted thinking and will verbalize challenging this cognitive distortion through positive thinking skills.  Patients will practice the skill of using positive affirmations to challenge cognitive distortions using affirmation cards.    Therapeutic Goals:  1. Patient will identify two or more cognitive distortions they have used 2. Patient will identify one or more emotions that stem from use of a cognitive distortion 3. Patient will demonstrate use of a positive affirmation to counter a cognitive distortion through discussion and/or role play. 4. Patient will describe one way cognitive distortions can be detrimental to wellness   Summary of Patient Progress:  Able to meet the above goals, however remains disorganized.  Verbalizes that some of her recent riskier behavior has not been safe and verbalizes frustrations with her choices.   Therapeutic Modalities:   Cognitive Behavioral Therapy Motivational Interviewing   August Saucer, LCSW 07/17/2017 4:58 PM

## 2017-07-17 NOTE — Progress Notes (Signed)
[facility] MD Progress Note  07/17/2017 1:53 PM Autumn [name]  MRN:  [id number] Subjective:   Autumn [name] is a 41 year old separated Caucasian female from [place]. This patient carries a diagnosis of bipolar disorder type and substance abuse. She presented to the emergency department on the involuntary commitment for erratic behavior In the [place] parking lot, she was knocking on people's windows and running around the parking lot.  07/13/2017. Autumn [name] remains paranoid, delusional and fearful. She was very tearful today anticipating something terrible to happen. She has been arguing with her boyfriend on the phone when he is unwilling to discuss her paranoid ideas to the point that she no longer wants to be with him. She does not want injectable antipsychotic because she lost period for 3 months after Invega sustenna injection. This is not impossible due to PRL increase. She did take haldol last night. There are no somatic complaints. She slept better last night.  07/14/2017. Autumn [name] is still very paranoid. She broke up with her boyfriend of 17 years this morning on the phone because he "does not believe what I say and tries to keep me in the hospital". She tolerates new medications well. Her belongings are no longer packed as they were when she anticipated apocalypse. She seems slightly more relaxed. There are no somatic complaints. She did not go to group today. Reports good sleep last night.  07/15/2017. Autumn [name] has been rather disorganized today and frightened her peers during group meeting when she warned everybody that if they go to the mall, they will be abducted, killed, and cut into pieces that will be send to all 2 states. She takes medications but her lithium level was lower after dose increase. I switched to liquid Lithium.   07/16/2017. Autumn [name] reports feeling better today but she slept 3 hours last night only and reportedly wondered into another patient;s room last night.  She denies hallucinations but still paranoid and frightened. She takes medications as prescribed. No side effects. No somatic complaints.  9.07/2017. Autumn [name] has been worse in the past 24 hours. She again is fearful and distraught, restless, pacing, bizarre. This may be due to lower dose of Abilify. She has been on two antipsychotics that I tried to cross taper. Did not sleep at all at night. Does strange things with tape. Believes her knee is busted. She is very tearful during interview wanting to go home.  Per nursing:  Patient has been up 5 times since midnight and reports vague,somatic complaints or "bad dreams".  Each time she has been approached and redirected toward her room after allowing her time to voice her concerns.  She is more somatic focused at 0500 and reports stomach upset from "not drinking enough water" and "being upset because I woke with a different pair of underwear on".  She requested "something for my nerves to un-nerve me". Patient was given Ativan 2 mg, po.  Principal Problem: Bipolar I disorder, most recent episode manic, severe without psychotic features ([place]) Diagnosis:   Patient Active Problem List   Diagnosis Date Noted  . Bipolar I disorder, most recent episode manic, severe without psychotic features ([place]) [F31.13] 07/05/2017  . Tobacco use disorder [F17.200] 06/27/2017  . 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 Opioid use disorder, severe, dependence ([place]) [F11.20] 06/27/2017  . Cannabis use disorder, severe, dependence ([place]) [F12.20] 06/27/2017  . Hx of glaucoma [Z86.69] 10/02/2013   Total Time spent with patient: 30 minutes  Past  Psychiatric History: Patient has been diagnosed with schizoaffective disorder, bipolar disorder, borderline personality disorder, depression and generalized anxiety disorder. She is being hospitalized here in our unit a multitude of times. She also has been [facility] at least twice. Patient says  she has attempted to walk in front of cars in the past. She does report history of self injury by cutting but has not engaged in this in years  She was just discharged from our unit on August 24. She returned to the ER manic again on August 27.  Past Medical History:  Past Medical History:  Diagnosis Date  . Allergy    Seasonal, Ultram (seizures)  . Anemia 2005  . Bipolar 1 disorder ([place])   . Glaucoma   . Seizures ([place]) 2008    Past Surgical History:  Procedure Laterality Date  . BREAST BIOPSY Right    x 2  . CESAREAN SECTION     x 2   Family History:  Family History  Problem Relation Age of Onset  . Hypertension Mother   . Hyperlipidemia Mother   . Parkinson's disease Father    Family Psychiatric  History: denies  Social History:  History  Alcohol Use No    Comment: Ocassional     History  Drug Use    Comment: percocet -last use over 1 week ago    Social History   Social History  . Marital status: Single    Spouse name: N/A  . Number of children: N/A  . Years of education: N/A   Social History Main Topics  . Smoking status: Current Every Day Smoker    Packs/day: 1.00    Years: 9.00  . Smokeless tobacco: Never Used     Comment: Patient not interested  . Alcohol use No     Comment: Ocassional  . Drug use: Yes     Comment: percocet -last use over 1 week ago  . Sexual activity: No   Other Topics Concern  . None   Social History Narrative  . None     Current Medications: Current Facility-Administered Medications  Medication Dose Route Frequency Provider Last Rate Last Dose  . alum & mag hydroxide-simeth (MAALOX/MYLANTA) 200-200-20 MG/5ML suspension 30 mL  30 mL Oral Q4H PRN Avantika Shere B, MD      . ARIPiprazole (ABILIFY) tablet 10 mg  10 mg Oral Daily Jihan Rudy B, MD   10 mg at 07/17/17 0826  . feeding supplement (ENSURE ENLIVE) (ENSURE ENLIVE) liquid 237 mL  237 mL Oral BID BM Hildred Priest, MD   237 mL at 07/17/17  1001  . lithium citrate 300 MG/5ML solution 450 mg  450 mg Oral TID Anginette Espejo B, MD   450 mg at 07/17/17 1247  . LORazepam (ATIVAN) tablet 2 mg  2 mg Oral Q6H PRN Yehya Brendle B, MD   2 mg at 07/17/17 1249  . magnesium hydroxide (MILK OF MAGNESIA) suspension 30 mL  30 mL Oral Daily PRN Maclain Cohron B, MD      . nicotine (NICODERM CQ - dosed in mg/24 hours) patch 21 mg  21 mg Transdermal Daily Lyrik Buresh B, MD   21 mg at 07/17/17 1247  . OLANZapine (ZYPREXA) tablet 30 mg  30 mg Oral QHS Pegah Segel B, MD   30 mg at 07/16/17 2151  . ondansetron (ZOFRAN-ODT) disintegrating tablet 4 mg  4 mg Oral Q8H PRN Hildred Priest, MD      . temazepam (RESTORIL) capsule 30  mg  30 mg Oral QHS Raynald Rouillard B, MD   30 mg at 07/16/17 2347  . traZODone (DESYREL) tablet 100 mg  100 mg Oral QHS Charlet Harr B, MD   100 mg at 07/16/17 2152    Lab Results:  No results found for this or any previous visit (from the past 48 hour(s)).  Blood Alcohol level:  Lab Results  Component Value Date   ETH <5 07/04/2017   ETH 18 (H) 49/70/2637    Metabolic Disorder Labs: Lab Results  Component Value Date   HGBA1C 5.0 06/27/2017   MPG 96.8 06/27/2017   No results found for: PROLACTIN Lab Results  Component Value Date   CHOL 152 06/27/2017   TRIG 68 06/27/2017   HDL 59 06/27/2017   CHOLHDL 2.6 06/27/2017   VLDL 14 06/27/2017   LDLCALC 79 06/27/2017   LDLCALC 97 10/02/2013    Physical Findings: AIMS: Facial and Oral Movements Muscles of Facial Expression: None, normal Lips and Perioral Area: None, normal Jaw: None, normal Tongue: None, normal,Extremity Movements Upper (arms, wrists, hands, fingers): None, normal Lower (legs, knees, ankles, toes): None, normal, Trunk Movements Neck, shoulders, hips: None, normal, Overall Severity Severity of abnormal movements (highest score from questions above): None, normal Incapacitation due to abnormal  movements: None, normal Patient's awareness of abnormal movements (rate only patient's report): No Awareness, Dental Status Current problems with teeth and/or dentures?: Yes  CIWA:    COWS:     Musculoskeletal: Strength & Muscle Tone: within normal limits Gait & Station: normal Patient leans: N/A  Psychiatric Specialty Exam: Physical Exam  Nursing note and vitals reviewed. Musculoskeletal: Normal range of motion.  Psychiatric: Her speech is normal and behavior is normal. Her affect is blunt. Thought content is paranoid and delusional. Cognition and memory are normal. She expresses impulsivity.    Review of Systems  Psychiatric/Behavioral: Positive for depression and substance abuse.  All other systems reviewed and are negative.   Blood pressure 106/64, pulse (!) 107, temperature 98.2 F (36.8 C), temperature source Oral, resp. rate 18, height 5\' 1"  (1.549 m), weight 38.1 kg (84 lb), SpO2 100 %.Body mass index is 15.87 kg/m.  General Appearance: Disheveled  Eye Contact:  Fair  Speech:  Slow  Volume:  Decreased  Mood:  Dysphoric  Affect: labile  Thought Process:  Linear and Descriptions of Associations: Loose  Orientation:  Full (Time, Place, and Person)  Thought Content:  Hallucinations: None  Suicidal Thoughts:  No  Homicidal Thoughts:  No  Memory:  Immediate;   Fair Recent;   Fair Remote;   Fair  Judgement:  Fair  Insight:  Fair  Psychomotor Activity:  Decreased  Concentration:  Concentration: Poor and Attention Span: Poor  Recall:  Poor  Fund of Knowledge:  Fair  Language:  Fair  Akathisia:  No  Handed:    AIMS (if indicated):     Assets:  Armed forces logistics/support/administrative officer Social Support  ADL's:  Intact  Cognition:  WNL  Sleep:  Number of Hours: 5     Treatment Plan Summary: Daily contact with patient to assess and evaluate symptoms and progress in treatment and Medication management   Ms. Boeh is a 41 year old female with a history of bipolar disorder admitted for a  psychotic break. She has been given various diagnoses, currently has a complicated pharmacological regimen. Overall for affective symptoms seem to be on the forefront and though she is on lithium and Tegretol, they seem to be suboptimally controlled. Her next lithium  level will be done tomorrow and dose can be adjusted after the result. We will plan to increase the dose of Abilify to control evolving manic symptoms.   1. Bipolar disorder. We continue Tegretol, lithium, Zyprexa and Abilify for mood stabilization and psychosis. Switch Lithium CR to liquid.  Lithium level in am.  2. EPS. Continue benztropine 0.5 mg q day. There is a history of dystonia with abilify.  3. Insomnia. Restoril 30 mg. We will add Trazodone.  4. Amphetamines, opiates and marijuana use . The patient will be referred to intensive outpatient substance abuse upon discharge.   5. Tobacco use disorder. Nicotine patch is available.   6. Metabolic syndrome monitoring. Lipid panel, TSH and HgbA1C were completed on 06/27/2017.  7. EKG. Sinus tachycardia with PVCs, QTc 466.  8. Pregnancy test is negative.  9. Disposition. She will be discharged to home with her boyfriend. She will follow up with [facility].   I certify that the services received since the previous certification/recertification were and continue to be medically necessary as the treatment provided can be reasonably expected to improve the patient's condition; the medical record documents that the services furnished were intensive treatment services or their equivalent services, and this patient continues to need, on a daily basis, active treatment furnished directly by or requiring the supervision of inpatient psychiatric personnel.   Orson Slick, MD 07/17/2017, 1:53 PM

## 2017-07-18 LAB — URINALYSIS, COMPLETE (UACMP) WITH MICROSCOPIC
BACTERIA UA: NONE SEEN
Bilirubin Urine: NEGATIVE
Glucose, UA: NEGATIVE mg/dL
Hgb urine dipstick: NEGATIVE
Ketones, ur: 20 mg/dL — AB
Leukocytes, UA: NEGATIVE
Nitrite: NEGATIVE
PH: 5 (ref 5.0–8.0)
Protein, ur: NEGATIVE mg/dL
RBC / HPF: NONE SEEN RBC/hpf (ref 0–5)
SPECIFIC GRAVITY, URINE: 1.014 (ref 1.005–1.030)

## 2017-07-18 MED ORDER — ACETAMINOPHEN 325 MG PO TABS
650.0000 mg | ORAL_TABLET | Freq: Four times a day (QID) | ORAL | Status: DC | PRN
  Administered 2017-07-18 – 2017-07-27 (×8): 650 mg via ORAL
  Filled 2017-07-18 (×9): qty 2

## 2017-07-18 MED ORDER — FOSFOMYCIN TROMETHAMINE 3 G PO PACK
3.0000 g | PACK | Freq: Once | ORAL | Status: DC
  Filled 2017-07-18: qty 3

## 2017-07-18 NOTE — Progress Notes (Signed)
Pt is very anxious and pacing. Pt affect is flat, brief eye contact. Pt has minimal interaction with staff and peers. Pt is med compliant, no adverse affects noted. Pt denies SI, HI or A/V hallucinations. Pt contracted to safety. 15 min safety checks continues.

## 2017-07-18 NOTE — [facility] Group Notes (Signed)
[facility] Group Notes:  (Nursing/MHT/Case Management/Adjunct)  Date:  07/18/2017  Time:  10:34 PM  Type of Therapy:  Group Therapy  Participation Level:  Active  Participation Quality:  Appropriate  Affect:  Appropriate  Cognitive:  Appropriate  Insight:  Appropriate  Engagement in Group:  Engaged  Modes of Intervention:  Discussion  Summary of Progress/Problems:  Kandis Fantasia 07/18/2017, 10:34 PM

## 2017-07-18 NOTE — Progress Notes (Signed)
[facility] MD Progress Note  07/18/2017 3:45 PM Autumn [name]  MRN:  [id number] Subjective:   Autumn [name] is a 41 year old separated Caucasian female from [place]. This patient carries a diagnosis of bipolar disorder type and substance abuse. She presented to the emergency department on the involuntary commitment for erratic behavior In the [place] parking lot, she was knocking on people's windows and running around the parking lot.  07/13/2017. Autumn [name] remains paranoid, delusional and fearful. She was very tearful today anticipating something terrible to happen. She has been arguing with her boyfriend on the phone when he is unwilling to discuss her paranoid ideas to the point that she no longer wants to be with him. She does not want injectable antipsychotic because she lost period for 3 months after Invega sustenna injection. This is not impossible due to PRL increase. She did take haldol last night. There are no somatic complaints. She slept better last night.  07/14/2017. Autumn [name] is still very paranoid. She broke up with her boyfriend of 17 years this morning on the phone because he "does not believe what I say and tries to keep me in the hospital". She tolerates new medications well. Her belongings are no longer packed as they were when she anticipated apocalypse. She seems slightly more relaxed. There are no somatic complaints. She did not go to group today. Reports good sleep last night.  07/15/2017. Autumn [name] has been rather disorganized today and frightened her peers during group meeting when she warned everybody that if they go to the mall, they will be abducted, killed, and cut into pieces that will be send to all [place] states. She takes medications but her lithium level was lower after dose increase. I switched to liquid Lithium.   07/16/2017. Autumn [name] reports feeling better today but she slept 3 hours last night only and reportedly wondered into another patient;s room last night.  She denies hallucinations but still paranoid and frightened. She takes medications as prescribed. No side effects. No somatic complaints.  9.07/2017. Autumn [name] has been worse in the past 24 hours. She again is fearful and distraught, restless, pacing, bizarre. This may be due to lower dose of Abilify. She has been on two antipsychotics that I tried to cross taper. Did not sleep at all at night. Does strange things with tape. Believes her knee is busted. She is very tearful during interview wanting to go home.  07/18/2017. Autumn [name] is restless, paranoid, hallucinating and confused. She does not sleep at night. She complains of dysuria. We will check urine.  Per nursing:  Patient continues to have evidence of disorganized thinking and bizarre behavior but anxiety is decreased and content is a bit more clear.  She focused less on her "knee injury" and talked more about her family and friends that she misses.  She was able to follow topic most of the time with some redirection.  She was without episodes of "panic attacks" or irrational fears.  She remembered to seek writer when needed for medications.  Principal Problem: Bipolar I disorder, most recent episode manic, severe without psychotic features ([place]) Diagnosis:   Patient Active Problem List   Diagnosis Date Noted  . Bipolar I disorder, most recent episode manic, severe without psychotic features ([place]) [F31.13] 07/05/2017  . Tobacco use disorder [F17.200] 06/27/2017  . 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 Opioid use disorder, severe, dependence ([place]) [F11.20] 06/27/2017  . Cannabis use disorder, severe, dependence ([place]) [F12.20] 06/27/2017  .  Hx of glaucoma [Z86.69] 10/02/2013   Total Time spent with patient: 30 minutes  Past Psychiatric History: Patient has been diagnosed with schizoaffective disorder, bipolar disorder, borderline personality disorder, depression and generalized anxiety disorder. She is being  hospitalized here in our unit a multitude of times. She also has been [facility] at least twice. Patient says she has attempted to walk in front of cars in the past. She does report history of self injury by cutting but has not engaged in this in years  She was just discharged from our unit on August 24. She returned to the ER manic again on August 27.  Past Medical History:  Past Medical History:  Diagnosis Date  . Allergy    Seasonal, Ultram (seizures)  . Anemia 2005  . Bipolar 1 disorder ([place])   . Glaucoma   . Seizures ([place]) 2008    Past Surgical History:  Procedure Laterality Date  . BREAST BIOPSY Right    x 2  . CESAREAN SECTION     x 2   Family History:  Family History  Problem Relation Age of Onset  . Hypertension Mother   . Hyperlipidemia Mother   . Parkinson's disease Father    Family Psychiatric  History: denies  Social History:  History  Alcohol Use No    Comment: Ocassional     History  Drug Use    Comment: percocet -last use over 1 week ago    Social History   Social History  . Marital status: Single    Spouse name: N/A  . Number of children: N/A  . Years of education: N/A   Social History Main Topics  . Smoking status: Current Every Day Smoker    Packs/day: 1.00    Years: 9.00  . Smokeless tobacco: Never Used     Comment: Patient not interested  . Alcohol use No     Comment: Ocassional  . Drug use: Yes     Comment: percocet -last use over 1 week ago  . Sexual activity: No   Other Topics Concern  . None   Social History Narrative  . None     Current Medications: Current Facility-Administered Medications  Medication Dose Route Frequency Provider Last Rate Last Dose  . alum & mag hydroxide-simeth (MAALOX/MYLANTA) 200-200-20 MG/5ML suspension 30 mL  30 mL Oral Q4H PRN Jaidah Lomax B, MD      . ARIPiprazole (ABILIFY) tablet 30 mg  30 mg Oral Daily Paulette Rockford B, MD   30 mg at 07/18/17 0814  . divalproex  (DEPAKOTE) DR tablet 500 mg  500 mg Oral Q8H Jacek Colson B, MD   500 mg at 07/18/17 1433  . feeding supplement (ENSURE ENLIVE) (ENSURE ENLIVE) liquid 237 mL  237 mL Oral BID BM Hildred Priest, MD   237 mL at 07/18/17 1400  . fosfomycin (MONUROL) packet 3 g  3 g Oral Once Henri Guedes B, MD      . lithium citrate 300 MG/5ML solution 450 mg  450 mg Oral TID Beacher Every B, MD   450 mg at 07/18/17 1136  . LORazepam (ATIVAN) tablet 2 mg  2 mg Oral Q6H PRN Sherl Yzaguirre B, MD   2 mg at 07/17/17 1249  . magnesium hydroxide (MILK OF MAGNESIA) suspension 30 mL  30 mL Oral Daily PRN Vilda Zollner B, MD      . nicotine (NICODERM CQ - dosed in mg/24 hours) patch 21 mg  21 mg Transdermal Daily Macguire Holsinger,  Makennah Omura B, MD   21 mg at 07/18/17 0814  . OLANZapine (ZYPREXA) tablet 30 mg  30 mg Oral QHS Sapphire Tygart B, MD   30 mg at 07/17/17 2154  . ondansetron (ZOFRAN-ODT) disintegrating tablet 4 mg  4 mg Oral Q8H PRN Hildred Priest, MD      . temazepam (RESTORIL) capsule 30 mg  30 mg Oral QHS Blair Lundeen B, MD   30 mg at 07/17/17 2251  . traZODone (DESYREL) tablet 200 mg  200 mg Oral QHS Akira Perusse B, MD   200 mg at 07/17/17 2154    Lab Results:  No results found for this or any previous visit (from the past 48 hour(s)).  Blood Alcohol level:  Lab Results  Component Value Date   ETH <5 07/04/2017   ETH 18 (H) 34/74/2595    Metabolic Disorder Labs: Lab Results  Component Value Date   HGBA1C 5.0 06/27/2017   MPG 96.8 06/27/2017   No results found for: PROLACTIN Lab Results  Component Value Date   CHOL 152 06/27/2017   TRIG 68 06/27/2017   HDL 59 06/27/2017   CHOLHDL 2.6 06/27/2017   VLDL 14 06/27/2017   LDLCALC 79 06/27/2017   LDLCALC 97 10/02/2013    Physical Findings: AIMS: Facial and Oral Movements Muscles of Facial Expression: None, normal Lips and Perioral Area: None, normal Jaw: None, normal Tongue:  None, normal,Extremity Movements Upper (arms, wrists, hands, fingers): None, normal Lower (legs, knees, ankles, toes): None, normal, Trunk Movements Neck, shoulders, hips: None, normal, Overall Severity Severity of abnormal movements (highest score from questions above): None, normal Incapacitation due to abnormal movements: None, normal Patient's awareness of abnormal movements (rate only patient's report): No Awareness, Dental Status Current problems with teeth and/or dentures?: Yes  CIWA:    COWS:     Musculoskeletal: Strength & Muscle Tone: within normal limits Gait & Station: normal Patient leans: N/A  Psychiatric Specialty Exam: Physical Exam  Nursing note and vitals reviewed. Musculoskeletal: Normal range of motion.  Psychiatric: Her speech is normal and behavior is normal. Her affect is blunt. Thought content is paranoid and delusional. Cognition and memory are normal. She expresses impulsivity.    Review of Systems  Psychiatric/Behavioral: Positive for depression and substance abuse.  All other systems reviewed and are negative.   Blood pressure 100/66, pulse 88, temperature 98 F (36.7 C), temperature source Oral, resp. rate 18, height 5\' 1"  (1.549 m), weight 38.1 kg (84 lb), SpO2 100 %.Body mass index is 15.87 kg/m.  General Appearance: Disheveled  Eye Contact:  Fair  Speech:  Slow  Volume:  Decreased  Mood:  Dysphoric  Affect: labile  Thought Process:  Linear and Descriptions of Associations: Loose  Orientation:  Full (Time, Place, and Person)  Thought Content:  Hallucinations: None  Suicidal Thoughts:  No  Homicidal Thoughts:  No  Memory:  Immediate;   Fair Recent;   Fair Remote;   Fair  Judgement:  Fair  Insight:  Fair  Psychomotor Activity:  Decreased  Concentration:  Concentration: Poor and Attention Span: Poor  Recall:  Poor  Fund of Knowledge:  Fair  Language:  Fair  Akathisia:  No  Handed:    AIMS (if indicated):     Assets:  Music therapist Social Support  ADL's:  Intact  Cognition:  WNL  Sleep:  Number of Hours: 6.15     Treatment Plan Summary: Daily contact with patient to assess and evaluate symptoms and progress in treatment and Medication  management   Ms. Cheong is a 41 year old female with a history of bipolar disorder admitted for a psychotic break. She has been given various diagnoses, currently has a complicated pharmacological regimen. Overall for affective symptoms seem to be on the forefront and though she is on lithium and Tegretol, they seem to be suboptimally controlled. Her next lithium level will be done tomorrow and dose can be adjusted after the result. We will plan to increase the dose of Abilify to control evolving manic symptoms.   1. Bipolar disorder. We continue Tegretol, lithium, Zyprexa and Abilify for mood stabilization and psychosis. Switch Lithium CR to liquid.  Lithium level in am.  2. EPS. Continue benztropine 0.5 mg q day. There is a history of dystonia with abilify.  3. Insomnia. Restoril 30 mg. We will add Trazodone.  4. Amphetamines, opiates and marijuana use . The patient will be referred to intensive outpatient substance abuse upon discharge.   5. Tobacco use disorder. Nicotine patch is available.   6. Metabolic syndrome monitoring. Lipid panel, TSH and HgbA1C were completed on 06/27/2017.  7. EKG. Sinus tachycardia with PVCs, QTc 466.  8. Pregnancy test is negative.  9. UTI. UA. We will give fosfomycin.   10. Disposition. She will be discharged to home with her boyfriend. She will follow up with [facility].    Orson Slick, MD 07/18/2017, 3:45 PM

## 2017-07-18 NOTE — Progress Notes (Signed)
Patient presents with sad, flat affect but brightens on approach. Denies SI, HI, AVH. No negative behaviors noted. Pt reported feeling better, slept well. Reports depression and hopelessness at 0 and anxiety at 5. Pt appetite good. Reports goal for today is to take care of herself. Pt did attend group.  Encouragement and support offered. Safety checks maintained. Medications given as prescribed.  Pt receptive and remains safe on unit with q 15 min checks.

## 2017-07-18 NOTE — Plan of Care (Signed)
Problem: Activity: Goal: Sleeping patterns will improve Outcome: Progressing Patient reports sleeping better. No psychosis noted

## 2017-07-18 NOTE — Plan of Care (Signed)
Problem: Activity: Goal: Imbalance in normal sleep/wake cycle will improve Outcome: Progressing Pt will have 5 or more hours of uninterrupted sleep this shift.

## 2017-07-18 NOTE — Plan of Care (Signed)
Problem: Activity: Goal: Interest or engagement in leisure activities will improve Outcome: Progressing Patient spent time in the dayroom and was able to pay attention to television show.  She also had less evidence of confusion and could engage with peers in a meaningful conversation.  Problem: Coping: Goal: Ability to identify and develop effective coping behavior will improve Outcome: Progressing Patient continues to have evidence of disorganized thinking and bizarre behavior but anxiety is decreased and content is a bit more clear.  She focused less on her "knee injury" and talked more about her family and friends that she misses.  She was able to follow topic most of the time with some redirection.  She was without episodes of "panic attacks" or irrational fears.  She remembered to seek writer when needed for medications.

## 2017-07-19 NOTE — Progress Notes (Signed)
[facility] MD Progress Note  07/19/2017 6:08 PM Autumn [name]  MRN:  [id number] Subjective:   [name] is a 41 year old separated Caucasian female from [place]. This patient carries a diagnosis of bipolar disorder type and substance abuse. She presented to the emergency department on the involuntary commitment for erratic behavior In the [place] parking lot, she was knocking on people's windows and running around the parking lot.  07/13/2017. [name] remains paranoid, delusional and fearful. She was very tearful today anticipating something terrible to happen. She has been arguing with her boyfriend on the phone when he is unwilling to discuss her paranoid ideas to the point that she no longer wants to be with him. She does not want injectable antipsychotic because she lost period for 3 months after Invega sustenna injection. This is not impossible due to PRL increase. She did take haldol last night. There are no somatic complaints. She slept better last night.  07/14/2017. [name] is still very paranoid. She broke up with her boyfriend of 17 years this morning on the phone because he "does not believe what I say and tries to keep me in the hospital". She tolerates new medications well. Her belongings are no longer packed as they were when she anticipated apocalypse. She seems slightly more relaxed. There are no somatic complaints. She did not go to group today. Reports good sleep last night.  07/15/2017. [name] has been rather disorganized today and frightened her peers during group meeting when she warned everybody that if they go to the mall, they will be abducted, killed, and cut into pieces that will be send to all 39 states. She takes medications but her lithium level was lower after dose increase. I switched to liquid Lithium.   07/16/2017. [name] reports feeling better today but she slept 3 hours last night only and reportedly wondered into another patient;s room last night.  She denies hallucinations but still paranoid and frightened. She takes medications as prescribed. No side effects. No somatic complaints.  9.07/2017. [name] has been worse in the past 24 hours. She again is fearful and distraught, restless, pacing, bizarre. This may be due to lower dose of Abilify. She has been on two antipsychotics that I tried to cross taper. Did not sleep at all at night. Does strange things with tape. Believes her knee is busted. She is very tearful during interview wanting to go home.  07/18/2017. [name] is restless, paranoid, hallucinating and confused. She does not sleep at night. She complains of dysuria. We will check urine.  07/19/2017. [name] is very disorganized today. She is paranoid and believes that she needs to protect her boyfriend from apocalypse. She just broke up with him. She looks unkempt. She stays in the hallways. She makes very little sense. There are no somatic complaints. She seems to take medications as prescribed. Lithium and Depakote level in the morning.  Per nursing:  Pt is very anxious and pacing. Pt affect is flat, brief eye contact. Pt has minimal interaction with staff and peers. Pt is med compliant, no adverse affects noted. Pt denies SI, HI or A/V hallucinations. Pt contracted to safety. 15 min safety checks continues.  Principal Problem: Bipolar I disorder, most recent episode manic, severe without psychotic features ([place]) Diagnosis:   Patient Active Problem List   Diagnosis Date Noted  . Bipolar I disorder, most recent episode manic, severe without psychotic features ([place]) [F31.13] 07/05/2017  . Tobacco use disorder [F17.200] 06/27/2017  .  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 Opioid use disorder, severe, dependence ([place]) [F11.20] 06/27/2017  . Cannabis use disorder, severe, dependence ([place]) [F12.20] 06/27/2017  . Hx of glaucoma [Z86.69] 10/02/2013   Total Time spent with patient: 30 minutes  Past  Psychiatric History: Patient has been diagnosed with schizoaffective disorder, bipolar disorder, borderline personality disorder, depression and generalized anxiety disorder. She is being hospitalized here in our unit a multitude of times. She also has been [facility] at least twice. Patient says she has attempted to walk in front of cars in the past. She does report history of self injury by cutting but has not engaged in this in years  She was just discharged from our unit on August 24. She returned to the ER manic again on August 27.  Past Medical History:  Past Medical History:  Diagnosis Date  . Allergy    Seasonal, Ultram (seizures)  . Anemia 2005  . Bipolar 1 disorder ([place])   . Glaucoma   . Seizures ([place]) 2008    Past Surgical History:  Procedure Laterality Date  . BREAST BIOPSY Right    x 2  . CESAREAN SECTION     x 2   Family History:  Family History  Problem Relation Age of Onset  . Hypertension Mother   . Hyperlipidemia Mother   . Parkinson's disease Father    Family Psychiatric  History: denies  Social History:  History  Alcohol Use No    Comment: Ocassional     History  Drug Use    Comment: percocet -last use over 1 week ago    Social History   Social History  . Marital status: Single    Spouse name: N/A  . Number of children: N/A  . Years of education: N/A   Social History Main Topics  . Smoking status: Current Every Day Smoker    Packs/day: 1.00    Years: 9.00  . Smokeless tobacco: Never Used     Comment: Patient not interested  . Alcohol use No     Comment: Ocassional  . Drug use: Yes     Comment: percocet -last use over 1 week ago  . Sexual activity: No   Other Topics Concern  . None   Social History Narrative  . None     Current Medications: Current Facility-Administered Medications  Medication Dose Route Frequency Provider Last Rate Last Dose  . acetaminophen (TYLENOL) tablet 650 mg  650 mg Oral Q6H PRN Clapacs,  [name], MD   650 mg at 07/19/17 1716  . alum & mag hydroxide-simeth (MAALOX/MYLANTA) 200-200-20 MG/5ML suspension 30 mL  30 mL Oral Q4H PRN Lear Carstens B, MD      . ARIPiprazole (ABILIFY) tablet 30 mg  30 mg Oral Daily Branston Halsted B, MD   30 mg at 07/19/17 0819  . divalproex (DEPAKOTE) DR tablet 500 mg  500 mg Oral Q8H Arissa Fagin B, MD   500 mg at 07/19/17 1601  . feeding supplement (ENSURE ENLIVE) (ENSURE ENLIVE) liquid 237 mL  237 mL Oral BID BM Hildred Priest, MD   237 mL at 07/19/17 1559  . lithium citrate 300 MG/5ML solution 450 mg  450 mg Oral TID Jerica Creegan B, MD   450 mg at 07/19/17 1601  . LORazepam (ATIVAN) tablet 2 mg  2 mg Oral Q6H PRN Keishon Chavarin B, MD   2 mg at 07/17/17 1249  . magnesium hydroxide (MILK OF MAGNESIA) suspension 30 mL  30 mL Oral Daily PRN Erasmus Bistline B, MD      . nicotine (NICODERM CQ - dosed in mg/24 hours) patch 21 mg  21 mg Transdermal Daily Marguis Mathieson B, MD   21 mg at 07/19/17 0819  . OLANZapine (ZYPREXA) tablet 30 mg  30 mg Oral QHS Alanny Rivers B, MD   30 mg at 07/18/17 2126  . ondansetron (ZOFRAN-ODT) disintegrating tablet 4 mg  4 mg Oral Q8H PRN Hildred Priest, MD      . temazepam (RESTORIL) capsule 30 mg  30 mg Oral QHS Tawana Pasch B, MD   30 mg at 07/18/17 2126  . traZODone (DESYREL) tablet 200 mg  200 mg Oral QHS Tymon Nemetz B, MD   200 mg at 07/18/17 2126    Lab Results:  Results for orders placed or performed during the hospital encounter of 07/05/17 (from the past 48 hour(s))  Urinalysis, Complete w Microscopic     Status: Abnormal   Collection Time: 07/18/17  5:33 PM  Result Value Ref Range   Color, Urine YELLOW (A) YELLOW   APPearance CLEAR (A) CLEAR   Specific Gravity, Urine 1.014 1.005 - 1.030   pH 5.0 5.0 - 8.0   Glucose, UA NEGATIVE NEGATIVE mg/dL   Hgb urine dipstick NEGATIVE NEGATIVE   Bilirubin Urine NEGATIVE NEGATIVE   Ketones, ur  20 (A) NEGATIVE mg/dL   Protein, ur NEGATIVE NEGATIVE mg/dL   Nitrite NEGATIVE NEGATIVE   Leukocytes, UA NEGATIVE NEGATIVE   RBC / HPF NONE SEEN 0 - 5 RBC/hpf   WBC, UA 0-5 0 - 5 WBC/hpf   Bacteria, UA NONE SEEN NONE SEEN   Squamous Epithelial / LPF 0-5 (A) NONE SEEN    Blood Alcohol level:  Lab Results  Component Value Date   ETH <5 07/04/2017   ETH 18 (H) 38/08/1750    Metabolic Disorder Labs: Lab Results  Component Value Date   HGBA1C 5.0 06/27/2017   MPG 96.8 06/27/2017   No results found for: PROLACTIN Lab Results  Component Value Date   CHOL 152 06/27/2017   TRIG 68 06/27/2017   HDL 59 06/27/2017   CHOLHDL 2.6 06/27/2017   VLDL 14 06/27/2017   LDLCALC 79 06/27/2017   LDLCALC 97 10/02/2013    Physical Findings: AIMS: Facial and Oral Movements Muscles of Facial Expression: None, normal Lips and Perioral Area: None, normal Jaw: None, normal Tongue: None, normal,Extremity Movements Upper (arms, wrists, hands, fingers): None, normal Lower (legs, knees, ankles, toes): None, normal, Trunk Movements Neck, shoulders, hips: None, normal, Overall Severity Severity of abnormal movements (highest score from questions above): None, normal Incapacitation due to abnormal movements: None, normal Patient's awareness of abnormal movements (rate only patient's report): No Awareness, Dental Status Current problems with teeth and/or dentures?: Yes  CIWA:    COWS:     Musculoskeletal: Strength & Muscle Tone: within normal limits Gait & Station: normal Patient leans: N/A  Psychiatric Specialty Exam: Physical Exam  Nursing note and vitals reviewed. Musculoskeletal: Normal range of motion.  Psychiatric: Her speech is normal and behavior is normal. Her affect is blunt. Thought content is paranoid and delusional. Cognition and memory are normal. She expresses impulsivity.    Review of Systems  Psychiatric/Behavioral: Positive for depression and substance abuse.  All other  systems reviewed and are negative.   Blood pressure 112/72, pulse 92, temperature 98 F (36.7 C), temperature source Oral, resp. rate 18, height 5\' 1"  (1.549 m), weight 38.1 kg (84 lb), SpO2 100 %.  Body mass index is 15.87 kg/m.  General Appearance: Disheveled  Eye Contact:  Fair  Speech:  Slow  Volume:  Decreased  Mood:  Dysphoric  Affect: labile  Thought Process:  Linear and Descriptions of Associations: Loose  Orientation:  Full (Time, Place, and Person)  Thought Content:  Hallucinations: None  Suicidal Thoughts:  No  Homicidal Thoughts:  No  Memory:  Immediate;   Fair Recent;   Fair Remote;   Fair  Judgement:  Fair  Insight:  Fair  Psychomotor Activity:  Decreased  Concentration:  Concentration: Poor and Attention Span: Poor  Recall:  Poor  Fund of Knowledge:  Fair  Language:  Fair  Akathisia:  No  Handed:    AIMS (if indicated):     Assets:  Armed forces logistics/support/administrative officer Social Support  ADL's:  Intact  Cognition:  WNL  Sleep:  Number of Hours: 6.45     Treatment Plan Summary: Daily contact with patient to assess and evaluate symptoms and progress in treatment and Medication management   [name] is a 41 year old female with a history of bipolar disorder admitted for a psychotic break. She has been given various diagnoses, currently has a complicated pharmacological regimen. Overall for affective symptoms seem to be on the forefront and though she is on lithium and Tegretol, they seem to be suboptimally controlled. Her next lithium level will be done tomorrow and dose can be adjusted after the result. We will plan to increase the dose of Abilify to control evolving manic symptoms.   1. Bipolar disorder. We continue Tegretol, lithium, Zyprexa and Abilify for mood stabilization and psychosis. Switch Lithium CR to liquid.  Lithium level in am.  2. EPS. Continue benztropine 0.5 mg q day. There is a history of dystonia with abilify.  3. Insomnia. Restoril 30 mg. We will add  Trazodone.  4. Amphetamines, opiates and marijuana use . The patient will be referred to intensive outpatient substance abuse upon discharge.   5. Tobacco use disorder. Nicotine patch is available.   6. Metabolic syndrome monitoring. Lipid panel, TSH and HgbA1C were completed on 06/27/2017.  7. EKG. Sinus tachycardia with PVCs, QTc 466.  8. Pregnancy test is negative.  9. UTI. UA. We will give fosfomycin.   10. Disposition. She will be discharged to home with her boyfriend. She will follow up with [facility].    Orson Slick, MD 07/19/2017, 6:08 PM

## 2017-07-19 NOTE — Progress Notes (Signed)
Patient ID: Autumn [name], female   DOB: September 02, 1976, 41 y.o.   MRN: [id number] LCSW Group Therapy Note 07/19/2017 9:00am  Type of Therapy and Topic:  Group Therapy:  Setting Goals  Participation Level:  Active  Description of Group: In this process group, patients discussed using strengths to work toward goals and address challenges.  Patients identified two positive things about themselves and one goal they were working on.  Patients were given the opportunity to share openly and support each other's plan for self-empowerment.  The group discussed the value of gratitude and were encouraged to have a daily reflection of positive characteristics or circumstances.  Patients were encouraged to identify a plan to utilize their strengths to work on current challenges and goals.  Therapeutic Goals 1. Patient will verbalize personal strengths/positive qualities and relate how these can assist with achieving desired personal goals 2. Patients will verbalize affirmation of peers plans for personal change and goal setting 3. Patients will explore the value of gratitude and positive focus as related to successful achievement of goals 4. Patients will verbalize a plan for regular reinforcement of personal positive qualities and circumstances.  Summary of Patient Progress:  Pt able to meet therapeutic goals listed above, however some of her statements were bizarre, for example she said her goal for the day had been "to save her boyfriend from the apocalypse, but he broke up with me yesterday so now I'm not sure what to do"     Therapeutic Modalities Cognitive [place], LCSW 07/19/2017 4:19 PM

## 2017-07-19 NOTE — BHH Group Notes (Signed)
[place] Group Notes:  (Nursing/MHT/Case Management/Adjunct)  Date:  07/19/2017  Time:  9:36 PM  Type of Therapy:  Group Therapy  Participation Level:  Active  Participation Quality:  Appropriate  Affect:  Appropriate  Cognitive:  Alert  Insight:  Good  Engagement in Group:  Engaged  Modes of Intervention:  Activity  Summary of Progress/Problems:  Autumn [name] 07/19/2017, 9:36 PM

## 2017-07-19 NOTE — Progress Notes (Signed)
Patient ID: Autumn [name], female   DOB: 03-15-1976, 41 y.o.   MRN: [id number] LCSW Group Therapy Note  07/19/2017 9:30am  Type of Therapy/Topic:  Group Therapy:  Feelings about Diagnosis  Participation Level:  Active   Description of Group:   This group will allow patients to explore their thoughts and feelings about diagnoses they have received. Patients will be guided to explore their level of understanding and acceptance of these diagnoses. Facilitator will encourage patients to process their thoughts and feelings about the reactions of others to their diagnosis and will guide patients in identifying ways to discuss their diagnosis with significant others in their lives. This group will be process-oriented, with patients participating in exploration of their own experiences, giving and receiving support, and processing challenge from other group members.   Therapeutic Goals: 1. Patient will demonstrate understanding of diagnosis as evidenced by identifying two or more symptoms of the disorder 2. Patient will be able to express two feelings regarding the diagnosis 3. Patient will demonstrate their ability to communicate their needs through discussion and/or role play  Summary of Patient Progress:   Pt participated in group discussion, able to meet therapeutic goals listed above.     Therapeutic Modalities:   Cognitive Behavioral Therapy Brief Therapy Feelings Identification    August Saucer, LCSW 07/19/2017 4:16 PM

## 2017-07-19 NOTE — BHH Group Notes (Signed)
[place] LCSW Group Therapy Note  Date/Time: 07/19/17, 1500  Type of Therapy/Topic:  Group Therapy:  Feelings about Diagnosis  Participation Level:  Active   Mood: pleasant   Description of Group:    This group will allow patients to explore their thoughts and feelings about diagnoses they have received. Patients will be guided to explore their level of understanding and acceptance of these diagnoses. Facilitator will encourage patients to process their thoughts and feelings about the reactions of others to their diagnosis, and will guide patients in identifying ways to discuss their diagnosis with significant others in their lives. This group will be process-oriented, with patients participating in exploration of their own experiences as well as giving and receiving support and challenge from other group members.   Therapeutic Goals: 1. Patient will demonstrate understanding of diagnosis as evidence by identifying two or more symptoms of the disorder:  2. Patient will be able to express two feelings regarding the diagnosis 3. Patient will demonstrate ability to communicate their needs through discussion and/or role plays  Summary of Patient Progress: Pt gave a list of diagnoses that she believes she has been given: multiple personality, PTSD, generalized anxiety disorder, schizophrenia.  Pt did not say bipolar.  Pt shared fears that she has about the unknowns of mental health diagnoses.  Good participation.        Therapeutic Modalities:   Cognitive Behavioral Therapy Brief Therapy Feelings Identification   Lurline Idol, LCSW

## 2017-07-19 NOTE — BHH Group Notes (Signed)
[place] Group Notes:  (Nursing/MHT/Case Management/Adjunct)  Date:  07/19/2017  Time:  7:16 PM  Type of Therapy:  Psychoeducational Skills  Participation Level:  Active  Participation Quality:  Appropriate, Attentive and Sharing  Affect:  Appropriate  Cognitive:  Alert and Appropriate  Insight:  Appropriate and Good  Engagement in Group:  Engaged  Modes of Intervention:  Discussion, Education and Socialization  Summary of Progress/Problems:  Autumn [name] 07/19/2017, 7:16 PM

## 2017-07-20 LAB — VALPROIC ACID LEVEL: VALPROIC ACID LVL: 96 ug/mL (ref 50.0–100.0)

## 2017-07-20 LAB — LITHIUM LEVEL: LITHIUM LVL: 0.56 mmol/L — AB (ref 0.60–1.20)

## 2017-07-20 NOTE — Plan of Care (Signed)
Problem: Safety: Goal: Ability to disclose and discuss suicidal ideas will improve Outcome: Progressing Denies SI

## 2017-07-20 NOTE — BHH Group Notes (Signed)
[place] Group Notes:  (Nursing/MHT/Case Management/Adjunct)  Date:  07/20/2017  Time:  11:26 AM  Type of Therapy:  Psychoeducational Skills  Participation Level:  Did Not Attend  Fiana Gladu A Lashae Wollenberg 07/20/2017, 11:26 AM

## 2017-07-20 NOTE — Progress Notes (Signed)
Affect flat.  Disorganized thought processes.  Denies SI/HI/AVH.   Medication and group compliant.  Requires occasional redirection.  Support offered.  Safety maintained.

## 2017-07-20 NOTE — Progress Notes (Signed)
D:Pt denies SI/HI/AVH, but noted responding to internal stimuli. Patient's affect is labile, she appears to have some confusion this evening to place and situation, and was frequently redirected. Patient's mood is pleasant. Patient is cooperative with treatment plan, her thoughts are less disorganized, speech is non pressured and coherent. Patient appeared anxious and was medicated for anxiety during the shift.  A:Pt was offered support and encouragement. Pt was given scheduled medications and PRN needed. Pt was encouraged to attend groups. Q 15 minute checks were done for safety.  R:Patient attends groups and interacted well with peers and staff. Pt is complaint with medication. Patient isreceptive to treatment and safety maintained on unit.

## 2017-07-20 NOTE — Progress Notes (Signed)
[facility] MD Progress Note  07/20/2017 3:22 PM Autumn [name]  MRN:  [id number] Subjective:   Autumn [name] is a 41 year old separated Caucasian female from [place]. This patient carries a diagnosis of bipolar disorder type and substance abuse. She presented to the emergency department on the involuntary commitment for erratic behavior In the [place] parking lot, she was knocking on people's windows and running around the parking lot.  07/13/2017. Autumn [name] remains paranoid, delusional and fearful. She was very tearful today anticipating something terrible to happen. She has been arguing with her boyfriend on the phone when he is unwilling to discuss her paranoid ideas to the point that she no longer wants to be with him. She does not want injectable antipsychotic because she lost period for 3 months after Invega sustenna injection. This is not impossible due to PRL increase. She did take haldol last night. There are no somatic complaints. She slept better last night.  07/14/2017. Autumn [name] is still very paranoid. She broke up with her boyfriend of 17 years this morning on the phone because he "does not believe what I say and tries to keep me in the hospital". She tolerates new medications well. Her belongings are no longer packed as they were when she anticipated apocalypse. She seems slightly more relaxed. There are no somatic complaints. She did not go to group today. Reports good sleep last night.  07/15/2017. Autumn [name] has been rather disorganized today and frightened her peers during group meeting when she warned everybody that if they go to the mall, they will be abducted, killed, and cut into pieces that will be send to all 59 states. She takes medications but her lithium level was lower after dose increase. I switched to liquid Lithium.   07/16/2017. Autumn [name] reports feeling better today but she slept 3 hours last night only and reportedly wondered into another patient;s room last night.  She denies hallucinations but still paranoid and frightened. She takes medications as prescribed. No side effects. No somatic complaints.  9.07/2017. Autumn [name] has been worse in the past 24 hours. She again is fearful and distraught, restless, pacing, bizarre. This may be due to lower dose of Abilify. She has been on two antipsychotics that I tried to cross taper. Did not sleep at all at night. Does strange things with tape. Believes her knee is busted. She is very tearful during interview wanting to go home.  07/18/2017. Autumn [name] is restless, paranoid, hallucinating and confused. She does not sleep at night. She complains of dysuria. We will check urine.  07/19/2017. Autumn [name] is very disorganized today. She is paranoid and believes that she needs to protect her boyfriend from apocalypse. She just broke up with him. She looks unkempt. She stays in the hallways. She makes very little sense. There are no somatic complaints. She seems to take medications as prescribed. Lithium and Depakote level in the morning.  07/20/2017. Autumn [name] remains tragically disorganized and paranoid. Li level 0.56, VPA 96.  Per nursing:  Pt is very anxious and pacing. Pt affect is flat, brief eye contact. Pt has minimal interaction with staff and peers. Pt is med compliant, no adverse affects noted. Pt denies SI, HI or A/V hallucinations. Pt contracted to safety. 15 min safety checks continues.  Principal Problem: Bipolar I disorder, most recent episode manic, severe without psychotic features ([place]) Diagnosis:   Patient Active Problem List   Diagnosis Date Noted  . Bipolar I disorder, most recent episode manic,  severe without psychotic features ([place]) [F31.13] 07/05/2017  . Tobacco use disorder [F17.200] 06/27/2017  . 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 Opioid use disorder, severe, dependence ([place]) [F11.20] 06/27/2017  . Cannabis use disorder, severe, dependence ([place]) [F12.20] 06/27/2017  . Hx  of glaucoma [Z86.69] 10/02/2013   Total Time spent with patient: 30 minutes  Past Psychiatric History: Patient has been diagnosed with schizoaffective disorder, bipolar disorder, borderline personality disorder, depression and generalized anxiety disorder. She is being hospitalized here in our unit a multitude of times. She also has been [facility] at least twice. Patient says she has attempted to walk in front of cars in the past. She does report history of self injury by cutting but has not engaged in this in years  She was just discharged from our unit on August 24. She returned to the ER manic again on August 27.  Past Medical History:  Past Medical History:  Diagnosis Date  . Allergy    Seasonal, Ultram (seizures)  . Anemia 2005  . Bipolar 1 disorder ([place])   . Glaucoma   . Seizures ([place]) 2008    Past Surgical History:  Procedure Laterality Date  . BREAST BIOPSY Right    x 2  . CESAREAN SECTION     x 2   Family History:  Family History  Problem Relation Age of Onset  . Hypertension Mother   . Hyperlipidemia Mother   . Parkinson's disease Father    Family Psychiatric  History: denies  Social History:  History  Alcohol Use No    Comment: Ocassional     History  Drug Use    Comment: percocet -last use over 1 week ago    Social History   Social History  . Marital status: Single    Spouse name: N/A  . Number of children: N/A  . Years of education: N/A   Social History Main Topics  . Smoking status: Current Every Day Smoker    Packs/day: 1.00    Years: 9.00  . Smokeless tobacco: Never Used     Comment: Patient not interested  . Alcohol use No     Comment: Ocassional  . Drug use: Yes     Comment: percocet -last use over 1 week ago  . Sexual activity: No   Other Topics Concern  . None   Social History Narrative  . None     Current Medications: Current Facility-Administered Medications  Medication Dose Route Frequency Provider Last  Rate Last Dose  . acetaminophen (TYLENOL) tablet 650 mg  650 mg Oral Q6H PRN Clapacs, [name], MD   650 mg at 07/19/17 1716  . alum & mag hydroxide-simeth (MAALOX/MYLANTA) 200-200-20 MG/5ML suspension 30 mL  30 mL Oral Q4H PRN Elley Harp B, MD      . ARIPiprazole (ABILIFY) tablet 30 mg  30 mg Oral Daily Derry Kassel B, MD   30 mg at 07/20/17 0819  . divalproex (DEPAKOTE) DR tablet 500 mg  500 mg Oral Q8H Shahzaib Azevedo B, MD   500 mg at 07/20/17 0631  . feeding supplement (ENSURE ENLIVE) (ENSURE ENLIVE) liquid 237 mL  237 mL Oral BID BM Hildred Priest, MD   237 mL at 07/19/17 1559  . lithium citrate 300 MG/5ML solution 450 mg  450 mg Oral TID Drake Wuertz B, MD   450 mg at 07/20/17 1229  . LORazepam (ATIVAN) tablet 2 mg  2 mg Oral Q6H PRN Torien Ramroop, Wardell Honour, MD  2 mg at 07/19/17 2154  . magnesium hydroxide (MILK OF MAGNESIA) suspension 30 mL  30 mL Oral Daily PRN Rula Keniston B, MD      . nicotine (NICODERM CQ - dosed in mg/24 hours) patch 21 mg  21 mg Transdermal Daily Rissie Sculley B, MD   21 mg at 07/20/17 0819  . OLANZapine (ZYPREXA) tablet 30 mg  30 mg Oral QHS Shylynn Bruning B, MD   30 mg at 07/19/17 2153  . ondansetron (ZOFRAN-ODT) disintegrating tablet 4 mg  4 mg Oral Q8H PRN Hildred Priest, MD      . temazepam (RESTORIL) capsule 30 mg  30 mg Oral QHS Jaynie Hitch B, MD   30 mg at 07/19/17 2154  . traZODone (DESYREL) tablet 200 mg  200 mg Oral QHS Areeb Corron B, MD   200 mg at 07/19/17 2154    Lab Results:  Results for orders placed or performed during the hospital encounter of 07/05/17 (from the past 48 hour(s))  Urinalysis, Complete w Microscopic     Status: Abnormal   Collection Time: 07/18/17  5:33 PM  Result Value Ref Range   Color, Urine YELLOW (A) YELLOW   APPearance CLEAR (A) CLEAR   Specific Gravity, Urine 1.014 1.005 - 1.030   pH 5.0 5.0 - 8.0   Glucose, UA NEGATIVE NEGATIVE mg/dL   Hgb  urine dipstick NEGATIVE NEGATIVE   Bilirubin Urine NEGATIVE NEGATIVE   Ketones, ur 20 (A) NEGATIVE mg/dL   Protein, ur NEGATIVE NEGATIVE mg/dL   Nitrite NEGATIVE NEGATIVE   Leukocytes, UA NEGATIVE NEGATIVE   RBC / HPF NONE SEEN 0 - 5 RBC/hpf   WBC, UA 0-5 0 - 5 WBC/hpf   Bacteria, UA NONE SEEN NONE SEEN   Squamous Epithelial / LPF 0-5 (A) NONE SEEN  Lithium level     Status: Abnormal   Collection Time: 07/20/17  7:20 AM  Result Value Ref Range   Lithium Lvl 0.56 (L) 0.60 - 1.20 mmol/L  Valproic acid level     Status: None   Collection Time: 07/20/17  7:20 AM  Result Value Ref Range   Valproic Acid Lvl 96 50.0 - 100.0 ug/mL    Blood Alcohol level:  Lab Results  Component Value Date   ETH <5 07/04/2017   ETH 18 (H) 42/59/5638    Metabolic Disorder Labs: Lab Results  Component Value Date   HGBA1C 5.0 06/27/2017   MPG 96.8 06/27/2017   No results found for: PROLACTIN Lab Results  Component Value Date   CHOL 152 06/27/2017   TRIG 68 06/27/2017   HDL 59 06/27/2017   CHOLHDL 2.6 06/27/2017   VLDL 14 06/27/2017   LDLCALC 79 06/27/2017   LDLCALC 97 10/02/2013    Physical Findings: AIMS: Facial and Oral Movements Muscles of Facial Expression: None, normal Lips and Perioral Area: None, normal Jaw: None, normal Tongue: None, normal,Extremity Movements Upper (arms, wrists, hands, fingers): None, normal Lower (legs, knees, ankles, toes): None, normal, Trunk Movements Neck, shoulders, hips: None, normal, Overall Severity Severity of abnormal movements (highest score from questions above): None, normal Incapacitation due to abnormal movements: None, normal Patient's awareness of abnormal movements (rate only patient's report): No Awareness, Dental Status Current problems with teeth and/or dentures?: Yes  CIWA:    COWS:     Musculoskeletal: Strength & Muscle Tone: within normal limits Gait & Station: normal Patient leans: N/A  Psychiatric Specialty Exam: Physical  Exam  Nursing note and vitals reviewed. Musculoskeletal: Normal range of  motion.  Psychiatric: Her speech is normal and behavior is normal. Her affect is blunt. Thought content is paranoid and delusional. Cognition and memory are normal. She expresses impulsivity.    Review of Systems  Psychiatric/Behavioral: Positive for depression and substance abuse.  All other systems reviewed and are negative.   Blood pressure 102/60, pulse 83, temperature 97.8 F (36.6 C), temperature source Oral, resp. rate 16, height 5\' 1"  (1.549 m), weight 38.1 kg (84 lb), SpO2 98 %.Body mass index is 15.87 kg/m.  General Appearance: Disheveled  Eye Contact:  Fair  Speech:  Slow  Volume:  Decreased  Mood:  Dysphoric  Affect: labile  Thought Process:  Linear and Descriptions of Associations: Loose  Orientation:  Full (Time, Place, and Person)  Thought Content:  Hallucinations: None  Suicidal Thoughts:  No  Homicidal Thoughts:  No  Memory:  Immediate;   Fair Recent;   Fair Remote;   Fair  Judgement:  Fair  Insight:  Fair  Psychomotor Activity:  Decreased  Concentration:  Concentration: Poor and Attention Span: Poor  Recall:  Poor  Fund of Knowledge:  Fair  Language:  Fair  Akathisia:  No  Handed:    AIMS (if indicated):     Assets:  Armed forces logistics/support/administrative officer Social Support  ADL's:  Intact  Cognition:  WNL  Sleep:  Number of Hours: 6.5     Treatment Plan Summary: Daily contact with patient to assess and evaluate symptoms and progress in treatment and Medication management   Autumn [name] is a 41 year old female with a history of bipolar disorder admitted for a psychotic break. She has been given various diagnoses, currently has a complicated pharmacological regimen. Overall for affective symptoms seem to be on the forefront and though she is on lithium and Tegretol, they seem to be suboptimally controlled. Her next lithium level will be done tomorrow and dose can be adjusted after the result. We will plan  to increase the dose of Abilify to control evolving manic symptoms.   1. Bipolar disorder. We continue Tegretol, lithium, Zyprexa and Abilify for mood stabilization and psychosis. Switch Lithium CR to liquid.  Lithium level in am.  2. EPS. Continue benztropine 0.5 mg q day. There is a history of dystonia with abilify.  3. Insomnia. Restoril 30 mg. We will add Trazodone.  4. Amphetamines, opiates and marijuana use . The patient will be referred to intensive outpatient substance abuse upon discharge.   5. Tobacco use disorder. Nicotine patch is available.   6. Metabolic syndrome monitoring. Lipid panel, TSH and HgbA1C were completed on 06/27/2017.  7. EKG. Sinus tachycardia with PVCs, QTc 466.  8. Pregnancy test is negative.  9. UTI. UA. We will give fosfomycin.   10. Disposition. She will be discharged to home with her boyfriend. She will follow up with [facility].    Orson Slick, MD 07/20/2017, 3:22 PM

## 2017-07-20 NOTE — Tx Team (Signed)
Interdisciplinary Treatment and Diagnostic Plan Update  07/20/2017 Time of Session: 11:00 AM Keta [place] MRN: [id number]  Principal Diagnosis: Bipolar I disorder, most recent episode manic, severe without psychotic features ([place])  Secondary Diagnoses: Principal Problem:   Bipolar I disorder, most recent episode manic, severe without psychotic features ([place]) Active Problems:   Hx of glaucoma   Tobacco use disorder   Amphetamine use disorder, severe (HCC)   Opioid use disorder, severe, dependence ([place])   Cannabis use disorder, severe, dependence ([place])   Current Medications:  Current Facility-Administered Medications  Medication Dose Route Frequency Provider Last Rate Last Dose  . acetaminophen (TYLENOL) tablet 650 mg  650 mg Oral Q6H PRN Clapacs, [name], MD   650 mg at 07/19/17 1716  . alum & mag hydroxide-simeth (MAALOX/MYLANTA) 200-200-20 MG/5ML suspension 30 mL  30 mL Oral Q4H PRN [name], [name], MD      . ARIPiprazole (ABILIFY) tablet 30 mg  30 mg Oral Daily [name], [name], MD   30 mg at 07/20/17 0819  . divalproex (DEPAKOTE) DR tablet 500 mg  500 mg Oral Q8H [name], [name], MD   500 mg at 07/20/17 0631  . feeding supplement (ENSURE ENLIVE) (ENSURE ENLIVE) liquid 237 mL  237 mL Oral BID BM Hildred Priest, MD   237 mL at 07/19/17 1559  . lithium citrate 300 MG/5ML solution 450 mg  450 mg Oral TID [name], [name], MD   450 mg at 07/20/17 1229  . LORazepam (ATIVAN) tablet 2 mg  2 mg Oral Q6H PRN [name], [name], MD   2 mg at 07/19/17 2154  . magnesium hydroxide (MILK OF MAGNESIA) suspension 30 mL  30 mL Oral Daily PRN [name], [name], MD      . nicotine (NICODERM CQ - dosed in mg/24 hours) patch 21 mg  21 mg Transdermal Daily [name], [name], MD   21 mg at 07/20/17 0819  . OLANZapine (ZYPREXA) tablet 30 mg  30 mg Oral QHS [name], [name], MD   30 mg at 07/19/17 2153  . ondansetron (ZOFRAN-ODT) disintegrating tablet 4  mg  4 mg Oral Q8H PRN Hildred Priest, MD      . temazepam (RESTORIL) capsule 30 mg  30 mg Oral QHS [name], [name], MD   30 mg at 07/19/17 2154  . traZODone (DESYREL) tablet 200 mg  200 mg Oral QHS [name], [name], MD   200 mg at 07/19/17 2154   PTA Medications: Prescriptions Prior to Admission  Medication Sig Dispense Refill Last Dose  . carbamazepine (TEGRETOL) 200 MG tablet Take 1 tablet (200 mg total) by mouth 2 (two) times daily. 60 tablet 0 07/04/2017 at am  . citalopram (CELEXA) 20 MG tablet Take 1 tablet (20 mg total) by mouth daily. 30 tablet 0 07/04/2017 at am  . clotrimazole (GYNE-LOTRIMIN 3) 2 % vaginal cream Place 1 Applicatorful vaginally at bedtime. 21 g 0 unknown at unknown  . lithium carbonate (LITHOBID) 300 MG CR tablet Take 1 tablet (300 mg total) by mouth every 12 (twelve) hours. 60 tablet 0 07/04/2017 at am  . traZODone (DESYREL) 150 MG tablet Take 1 tablet (150 mg total) by mouth at bedtime. 30 tablet 0 07/03/2017 at pm    Patient Stressors: Financial difficulties Medication change or noncompliance  Patient Strengths: Average or above average intelligence Capable of independent living Work skills  Treatment Modalities: Medication Management, Group therapy, Case management,  1 to 1 session with clinician, Psychoeducation, Recreational therapy.  Physician Treatment Plan for Primary Diagnosis: Bipolar I disorder, most recent episode manic, severe without psychotic features ([place]) Long Term Goal(s): Improvement in symptoms so as ready for discharge Improvement in symptoms so as ready for discharge   Short Term Goals: Ability to identify changes in lifestyle to reduce recurrence of condition will improve Ability to demonstrate self-control will improve Compliance with prescribed medications will improve Ability to identify changes in lifestyle to reduce recurrence of condition will improve Ability to identify and develop effective coping behaviors  will improve  Medication Management: Evaluate patient's response, side effects, and tolerance of medication regimen.  Therapeutic Interventions: 1 to 1 sessions, Unit Group sessions and Medication administration.  Evaluation of Outcomes: Progressing  Physician Treatment Plan for Secondary Diagnosis: Principal Problem:   Bipolar I disorder, most recent episode manic, severe without psychotic features ([place]) Active Problems:   Hx of glaucoma   Tobacco use disorder   Amphetamine use disorder, severe (H[facility])   Opioid use disorder, severe, dependence ([place])   Cannabis use disorder, severe, dependence ([place])  Long Term Goal(s): Improvement in symptoms so as ready for discharge Improvement in symptoms so as ready for discharge   Short Term Goals: Ability to identify changes in lifestyle to reduce recurrence of condition will improve Ability to demonstrate self-control will improve Compliance with prescribed medications will improve Ability to identify changes in lifestyle to reduce recurrence of condition will improve Ability to identify and develop effective coping behaviors will improve     Medication Management: Evaluate patient's response, side effects, and tolerance of medication regimen.  Therapeutic Interventions: 1 to 1 sessions, Unit Group sessions and Medication administration.  Evaluation of Outcomes: Progressing   RN Treatment Plan for Primary Diagnosis: Bipolar I disorder, most recent episode manic, severe without psychotic features ([place]) Long Term Goal(s): Knowledge of disease and therapeutic regimen to maintain health will improve  Short Term Goals: Ability to demonstrate self-control, Ability to verbalize feelings will improve and Compliance with prescribed medications will improve  Medication Management: RN will administer medications as ordered by provider, will assess and evaluate patient's response and provide education to patient for prescribed medication. RN will  report any adverse and/or side effects to prescribing provider.  Therapeutic Interventions: 1 on 1 counseling sessions, Psychoeducation, Medication administration, Evaluate responses to treatment, Monitor vital signs and CBGs as ordered, Perform/monitor CIWA, COWS, AIMS and Fall Risk screenings as ordered, Perform wound care treatments as ordered.  Evaluation of Outcomes: Progressing   LCSW Treatment Plan for Primary Diagnosis: Bipolar I disorder, most recent episode manic, severe without psychotic features ([place]) Long Term Goal(s): Safe transition to appropriate next level of care at discharge, Engage patient in therapeutic group addressing interpersonal concerns.  Short Term Goals: Engage patient in aftercare planning with referrals and resources, Increase social support and Identify triggers associated with mental health/substance abuse issues  Therapeutic Interventions: Assess for all discharge needs, 1 to 1 time with Social worker, Explore available resources and support systems, Assess for adequacy in community support network, Educate family and significant other(s) on suicide prevention, Complete Psychosocial Assessment, Interpersonal group therapy.  Evaluation of Outcomes: Progressing   Progress in Treatment: Attending groups: Yes. Participating in groups: Yes. Taking medication as prescribed: Yes. Toleration medication: Yes. Family/Significant other contact made: No, will contact:  CSW assessing appropriate contact. Patient understands diagnosis: Yes. Discussing patient identified problems/goals with staff: Yes. Medical problems stabilized or resolved: Yes. Denies suicidal/homicidal ideation: Yes. Issues/concerns per patient self-inventory: No.  New problem(s) identified: No, Describe:  None.  New Short Term/Long Term Goal(s): Patient stated that her goal is to take better care of her health.  Discharge Plan or Barriers: Patient agreeable to follow up with [facility]. Patient plans  to discharge back with her husband.  Reason for Continuation of Hospitalization: Anxiety Mania Other; describe paranoia   Attempted to elope on 07/11/17  9/7:  Ms. Deyo has been rather disorganized today and frightened her peers during group meeting when she warned everybody that if they go to the mall, they will be abducted, killed, and cut into pieces that will be send to all 50 states. She takes medications but her lithium level was lower after dose increase. I switched to liquid Lithium.    Estimated Length of Stay: 3-5 days   Attendees: Patient: not present 07/20/2017 3:08 PM  Physician: Dr. Duanne Guess, MD  07/20/2017 3:08 PM  Nursing: Dorthula Matas 07/20/2017 3:08 PM  RN Care Manager: 07/20/2017 3:08 PM  Social Worker: Wray Kearns, LCSW 07/20/2017 3:08 PM  Recreational Therapist:  07/20/2017 3:08 PM  Other:  07/20/2017 3:08 PM  Other:  07/20/2017 3:08 PM  Other: 07/20/2017 3:08 PM    Scribe for Treatment Team: Wray Kearns, LCSW 07/20/2017 3:08 PM

## 2017-07-20 NOTE — [facility] Group Notes (Signed)
[place] LCSW Group Therapy  07/20/2017 2:24 PM  Type of Therapy:  Group Therapy  Participation Level:  Active  Participation Quality:  Attentive  Affect:  Flat  Cognitive:  Disorganized  Insight:  Limited  Engagement in Therapy:  Improving  Modes of Intervention:  Activity, Discussion, Education, Socialization and Support  Summary of Progress/Problems: Emotional Regulation: Patients will identify both negative and positive emotions. They will discuss emotions they have difficulty regulating and how they impact their lives. Patients will be asked to identify healthy coping skills to combat unhealthy reactions to negative emotions.     [place] MSW, LCSW  07/20/2017, 2:24 PM

## 2017-07-21 NOTE — [facility] Group Notes (Signed)
[place] LCSW Group Therapy Note  Date/Time: 07/21/17, 1300  Type of Therapy/Topic:  Group Therapy:  Balance in Life  Participation Level:  active  Description of Group:    This group will address the concept of balance and how it feels and looks when one is unbalanced. Patients will be encouraged to process areas in their lives that are out of balance, and identify reasons for remaining unbalanced. Facilitators will guide patients utilizing problem- solving interventions to address and correct the stressor making their life unbalanced. Understanding and applying boundaries will be explored and addressed for obtaining  and maintaining a balanced life. Patients will be encouraged to explore ways to assertively make their unbalanced needs known to significant others in their lives, using other group members and facilitator for support and feedback.  Therapeutic Goals: 1. Patient will identify two or more emotions or situations they have that consume much of in their lives. 2. Patient will identify signs/triggers that life has become out of balance:  3. Patient will identify two ways to set boundaries in order to achieve balance in their lives:  4. Patient will demonstrate ability to communicate their needs through discussion and/or role plays  Summary of Patient Progress: Pt concerned today about her mother and boyfriend spending her money while she is still hospitalized.  CSW did redirect pt and had conversation with her after group.  Pt participated in the group discussion appropriately.          Therapeutic Modalities:   Cognitive Behavioral Therapy Solution-Focused Therapy Assertiveness Training  Lurline Idol, 

## 2017-07-21 NOTE — Progress Notes (Signed)
[facility] MD Progress Note  07/21/2017 4:26 PM Autumn [name]  MRN:  [id number] Subjective:   [name] is a 41 year old female with a history of schizoaffective disorder bipolar type admitted floridly psychotic, very paranoid. She has been improving slowly on a combination of Abilify and Zyprexa (allergic to Haldol) and Lithium and Depakote.   07/19/2017. [name] is very disorganized today. She is paranoid and believes that she needs to protect her boyfriend from apocalypse. She just broke up with him. She looks unkempt. She stays in the hallways. She makes very little sense. There are no somatic complaints. She seems to take medications as prescribed. Lithium and Depakote level in the morning.  07/20/2017. [name] remains tragically disorganized and paranoid. Li level 0.56, VPA 96.  9/13.2018. [name] has a good day today. She does not look frightened. She is not restless. Her thoughts are better put together. She is able to hold a brief conversation. She is better groomed.She has no complaints.   Per nursing:  D :Pt denies SI/HI/AVH. Patient's affect is flat but brightens upon approach, her mood is pleasant, and she is cooperative with treatment plan. Patient's thoughts are less disorganized, speech is non pressured and coherent. Patient appears less anxious and no confusion is noted during the shift. Patient is interacting with peers and staff appropriately.  A:Pt was offered support and encouragement. Pt was given scheduled medications. Pt was encouraged to attend groups. Q 15 minute checks were done for safety.  R:Patient attends groups and interacts well with peers and staff. Pt is complaint with medication. Patient isreceptive to treatment and safety maintained on unit.  Principal Problem: Bipolar I disorder, most recent episode manic, severe without psychotic features ([place]) Diagnosis:   Patient Active Problem List   Diagnosis Date Noted  . Bipolar I disorder, most recent  episode manic, severe without psychotic features ([place]) [F31.13] 07/05/2017  . Tobacco use disorder [F17.200] 06/27/2017  . 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 Opioid use disorder, severe, dependence ([place]) [F11.20] 06/27/2017  . Cannabis use disorder, severe, dependence ([place]) [F12.20] 06/27/2017  . Hx of glaucoma [Z86.69] 10/02/2013   Total Time spent with patient: 30 minutes  Past Psychiatric History: Patient has been diagnosed with schizoaffective disorder, bipolar disorder, borderline personality disorder, depression and generalized anxiety disorder. She is being hospitalized here in our unit a multitude of times. She also has been [facility] at least twice. Patient says she has attempted to walk in front of cars in the past. She does report history of self injury by cutting but has not engaged in this in years  She was just discharged from our unit on August 24. She returned to the ER manic again on August 27.  Past Medical History:  Past Medical History:  Diagnosis Date  . Allergy    Seasonal, Ultram (seizures)  . Anemia 2005  . Bipolar 1 disorder ([place])   . Glaucoma   . Seizures ([place]) 2008    Past Surgical History:  Procedure Laterality Date  . BREAST BIOPSY Right    x 2  . CESAREAN SECTION     x 2   Family History:  Family History  Problem Relation Age of Onset  . Hypertension Mother   . Hyperlipidemia Mother   . Parkinson's disease Father    Family Psychiatric  History: denies  Social History:  History  Alcohol Use No    Comment: Ocassional     History  Drug Use  Comment: percocet -last use over 1 week ago    Social History   Social History  . Marital status: Single    Spouse name: N/A  . Number of children: N/A  . Years of education: N/A   Social History Main Topics  . Smoking status: Current Every Day Smoker    Packs/day: 1.00    Years: 9.00  . Smokeless tobacco: Never Used     Comment: Patient not  interested  . Alcohol use No     Comment: Ocassional  . Drug use: Yes     Comment: percocet -last use over 1 week ago  . Sexual activity: No   Other Topics Concern  . None   Social History Narrative  . None     Current Medications: Current Facility-Administered Medications  Medication Dose Route Frequency Provider Last Rate Last Dose  . acetaminophen (TYLENOL) tablet 650 mg  650 mg Oral Q6H PRN Clapacs, [name], MD   650 mg at 07/19/17 1716  . alum & mag hydroxide-simeth (MAALOX/MYLANTA) 200-200-20 MG/5ML suspension 30 mL  30 mL Oral Q4H PRN Faustino Luecke B, MD      . ARIPiprazole (ABILIFY) tablet 30 mg  30 mg Oral Daily Arliene Rosenow B, MD   30 mg at 07/21/17 0839  . divalproex (DEPAKOTE) DR tablet 500 mg  500 mg Oral Q8H Amberle Lyter B, MD   500 mg at 07/21/17 0635  . feeding supplement (ENSURE ENLIVE) (ENSURE ENLIVE) liquid 237 mL  237 mL Oral BID BM Hildred Priest, MD   237 mL at 07/21/17 1000  . lithium citrate 300 MG/5ML solution 450 mg  450 mg Oral TID Jakaleb Payer B, MD   450 mg at 07/21/17 1229  . LORazepam (ATIVAN) tablet 2 mg  2 mg Oral Q6H PRN Terricka Onofrio B, MD   2 mg at 07/19/17 2154  . magnesium hydroxide (MILK OF MAGNESIA) suspension 30 mL  30 mL Oral Daily PRN Tuwanna Krausz B, MD      . nicotine (NICODERM CQ - dosed in mg/24 hours) patch 21 mg  21 mg Transdermal Daily Brecken Dewoody B, MD   21 mg at 07/21/17 0840  . OLANZapine (ZYPREXA) tablet 30 mg  30 mg Oral QHS Bluford Sedler B, MD   30 mg at 07/20/17 2132  . ondansetron (ZOFRAN-ODT) disintegrating tablet 4 mg  4 mg Oral Q8H PRN Hildred Priest, MD      . temazepam (RESTORIL) capsule 30 mg  30 mg Oral QHS Nassir Neidert B, MD   30 mg at 07/20/17 2132  . traZODone (DESYREL) tablet 200 mg  200 mg Oral QHS Hadas Jessop B, MD   200 mg at 07/20/17 2131    Lab Results:  Results for orders placed or performed during the hospital  encounter of 07/05/17 (from the past 48 hour(s))  Lithium level     Status: Abnormal   Collection Time: 07/20/17  7:20 AM  Result Value Ref Range   Lithium Lvl 0.56 (L) 0.60 - 1.20 mmol/L  Valproic acid level     Status: None   Collection Time: 07/20/17  7:20 AM  Result Value Ref Range   Valproic Acid Lvl 96 50.0 - 100.0 ug/mL    Blood Alcohol level:  Lab Results  Component Value Date   ETH <5 07/04/2017   ETH 18 (H) 62/83/6629    Metabolic Disorder Labs: Lab Results  Component Value Date   HGBA1C 5.0 06/27/2017   MPG 96.8 06/27/2017  No results found for: PROLACTIN Lab Results  Component Value Date   CHOL 152 06/27/2017   TRIG 68 06/27/2017   HDL 59 06/27/2017   CHOLHDL 2.6 06/27/2017   VLDL 14 06/27/2017   LDLCALC 79 06/27/2017   LDLCALC 97 10/02/2013    Physical Findings: AIMS: Facial and Oral Movements Muscles of Facial Expression: None, normal Lips and Perioral Area: None, normal Jaw: None, normal Tongue: None, normal,Extremity Movements Upper (arms, wrists, hands, fingers): None, normal Lower (legs, knees, ankles, toes): None, normal, Trunk Movements Neck, shoulders, hips: None, normal, Overall Severity Severity of abnormal movements (highest score from questions above): None, normal Incapacitation due to abnormal movements: None, normal Patient's awareness of abnormal movements (rate only patient's report): No Awareness, Dental Status Current problems with teeth and/or dentures?: Yes  CIWA:    COWS:     Musculoskeletal: Strength & Muscle Tone: within normal limits Gait & Station: normal Patient leans: N/A  Psychiatric Specialty Exam: Physical Exam  Nursing note and vitals reviewed. Musculoskeletal: Normal range of motion.  Psychiatric: Her speech is normal and behavior is normal. Her affect is blunt. Thought content is paranoid and delusional. Cognition and memory are normal. She expresses impulsivity.    Review of Systems   Psychiatric/Behavioral: Positive for depression and substance abuse.  All other systems reviewed and are negative.   Blood pressure 101/60, pulse 87, temperature 97.8 F (36.6 C), resp. rate 16, height 5\' 1"  (1.549 m), weight 38.1 kg (84 lb), SpO2 98 %.Body mass index is 15.87 kg/m.  General Appearance: Disheveled  Eye Contact:  Fair  Speech:  Slow  Volume:  Decreased  Mood:  Dysphoric  Affect: labile  Thought Process:  Linear and Descriptions of Associations: Loose  Orientation:  Full (Time, Place, and Person)  Thought Content:  Hallucinations: None  Suicidal Thoughts:  No  Homicidal Thoughts:  No  Memory:  Immediate;   Fair Recent;   Fair Remote;   Fair  Judgement:  Fair  Insight:  Fair  Psychomotor Activity:  Decreased  Concentration:  Concentration: Poor and Attention Span: Poor  Recall:  Poor  Fund of Knowledge:  Fair  Language:  Fair  Akathisia:  No  Handed:    AIMS (if indicated):     Assets:  Armed forces logistics/support/administrative officer Social Support  ADL's:  Intact  Cognition:  WNL  Sleep:  Number of Hours: 5.45     Treatment Plan Summary: Daily contact with patient to assess and evaluate symptoms and progress in treatment and Medication management   Ms. Cressler is a 41 year old female with a history of bipolar disorder admitted for a psychotic break. She has been given various diagnoses, currently has a complicated pharmacological regimen. Overall for affective symptoms seem to be on the forefront and though she is on lithium and Tegretol, they seem to be suboptimally controlled. Her next lithium level will be done tomorrow and dose can be adjusted after the result. We will plan to increase the dose of Abilify to control evolving manic symptoms.   1. Bipolar disorder. We continue Tegretol, lithium, Zyprexa and Abilify for mood stabilization and psychosis. Switch Lithium CR to liquid.  Lithium level in am.  2. EPS. Continue benztropine 0.5 mg q day. There is a history of dystonia with  abilify.  3. Insomnia. Restoril 30 mg. We will add Trazodone.  4. Amphetamines, opiates and marijuana use . The patient will be referred to intensive outpatient substance abuse upon discharge.   5. Tobacco use disorder. Nicotine patch is  available.   6. Metabolic syndrome monitoring. Lipid panel, TSH and HgbA1C were completed on 06/27/2017.  7. EKG. Sinus tachycardia with PVCs, QTc 466.  8. Pregnancy test is negative.  9. UTI. UA. We will give fosfomycin.   10. Disposition. She will be discharged to home with her boyfriend. She will follow up with [facility].    Orson Slick, MD 07/21/2017, 4:26 PM

## 2017-07-21 NOTE — Plan of Care (Signed)
Problem: Safety: Goal: Ability to disclose and discuss suicidal ideas will improve Outcome: Progressing Denies SI.  Safety maintained on the unit.

## 2017-07-21 NOTE — Progress Notes (Signed)
Continues to present with sad flat affect.  Paces halls at times.  Quiet and reserved.  Visible in the milieu although has minimal interaction with peers.  Scheduled medications given.  Support offered.  Safety checks maintained.

## 2017-07-21 NOTE — Progress Notes (Signed)
D :Pt denies SI/HI/AVH. Patient's affect is flat but brightens upon approach, her mood is pleasant, and she is cooperative with treatment plan. Patient's thoughts are less disorganized, speech is non pressured and coherent. Patient appears less anxious and no confusion is noted during the shift. Patient is  interacting with peers and staff appropriately.  A:Pt was offered support and encouragement. Pt was given scheduled medications. Pt was encouraged to attend groups. Q 15 minute checks were done for safety.  R:Patient attends groups and interacts well with peers and staff. Pt is complaint with medication. Patient isreceptive to treatment and safety maintained on unit.

## 2017-07-21 NOTE — [facility] Group Notes (Signed)
[place] Group Notes:  (Nursing/MHT/Case Management/Adjunct)  Date:  07/21/2017  Time:  9:38 PM  Type of Therapy:  Group Therapy  Participation Level:  Active  Participation Quality:  Appropriate  Affect:  Appropriate  Cognitive:  Alert  Insight:  Appropriate  Engagement in Group:  Engaged  Modes of Intervention:  Support  Summary of Progress/Problems:  Nehemiah Settle 07/21/2017, 9:38 PM

## 2017-07-21 NOTE — B[facility] Group Notes (Signed)
[place] Group Notes:  (Nursing/MHT/Case Management/Adjunct)  Date:  07/21/2017  Time:  4:23 PM  Type of Therapy:  Psychoeducational Skills  Participation Level:  Minimal  Participation Quality:  Inattentive and Redirectable  Affect:  Flat  Cognitive:  Disorganized  Insight:  Limited  Engagement in Group:  Limited  Modes of Intervention:  Discussion and Education  Summary of Progress/Problems:  Charise Killian 07/21/2017, 4:23 PM

## 2017-07-22 DIAGNOSIS — F1220 Cannabis dependence, uncomplicated: Secondary | ICD-10-CM

## 2017-07-22 DIAGNOSIS — F1520 Other stimulant dependence, uncomplicated: Secondary | ICD-10-CM

## 2017-07-22 MED ORDER — TEMAZEPAM 15 MG PO CAPS
15.0000 mg | ORAL_CAPSULE | Freq: Every day | ORAL | Status: DC
  Administered 2017-07-22 – 2017-07-28 (×7): 15 mg via ORAL
  Filled 2017-07-22 (×7): qty 1

## 2017-07-22 MED ORDER — LATANOPROST 0.005 % OP SOLN
1.0000 [drp] | Freq: Every day | OPHTHALMIC | Status: DC
  Administered 2017-07-22 – 2017-07-28 (×7): 1 [drp] via OPHTHALMIC
  Filled 2017-07-22: qty 2.5

## 2017-07-22 MED ORDER — LITHIUM CARBONATE ER 300 MG PO TBCR
600.0000 mg | EXTENDED_RELEASE_TABLET | Freq: Two times a day (BID) | ORAL | Status: DC
  Administered 2017-07-22 – 2017-07-29 (×14): 600 mg via ORAL
  Filled 2017-07-22 (×14): qty 2

## 2017-07-22 NOTE — Progress Notes (Signed)
D:Pt denies SI/HI/AVH. Patient's affect is flat but brightens upon approach, her mood is pleasant, and she is cooperative with treatment plan. Patient's thoughts are organized, speech is coherent. Patient appears less anxious and no confusion is noted during the shift. Patient is interacting with peers and staff appropriately.  A:Pt was offered support and encouragement. Pt was given scheduled medications. Pt was encouraged to attend groups. Q 15 minute checks were done for safety.  R:Patient attends groups and interacts well with peers and staff. Pt is complaint with medication. Patient isreceptive to treatment and safety maintained on unit.

## 2017-07-22 NOTE — B[facility] Group Notes (Signed)
LCSW Group Therapy Note   07/22/2017 2:45pm  Type of Therapy and Topic:  Group Therapy:   Emotions and Triggers    Participation Level:  Active  Description of Group: Participants were asked to participate in an assignment that involved exploring more about oneself. Patients were asked to identify things that triggered their emotions about coming into the hospital and think about the physical symptoms they experienced when feeling this way. Pt's were encouraged to identify the thoughts that they have when feeling this way and discuss ways to cope with it.  Therapeutic Goals:   1. Patient will state the definition of an emotion and identify two pleasant and two unpleasant emotions they have experienced. 2. Patient will describe the relationship between thoughts, emotions and triggers.  3. Patient will state the definition of a trigger and identify three triggers prior to this admission.  4. Patient will demonstrate through role play how to use coping skills to deescalate themselves when triggered. Summary of Patient Progress: Jacci would not discuss her triggers, but reports the emotion that causes triggers is anger.  She reports she cannot act on her anger as she understands the consequences. Behaviorally Annetta was confrontational and needed re-direction at times and remains guarded with information in discussing the group topic.   She remained in the group for the entire duration and reports she copes with emotions and triggers at times with exercise.  It is difficult to ascertain if patient is paranoid, guarded, or defiant with behaviors as she attends, but refuses to discuss anything and will become confrontational in discussion if you ask her to elaborate.      Therapeutic Modalities: Cognitive Behavioral Therapy Motivational Interviewing   Marshell Garfinkel 07/22/2017 2:14 PM

## 2017-07-22 NOTE — Plan of Care (Signed)
Problem: Safety: Goal: Ability to disclose and discuss suicidal ideas will improve Outcome: Progressing Denies SI

## 2017-07-22 NOTE — Plan of Care (Signed)
Problem: Education: Goal: Knowledge of the prescribed therapeutic regimen will improve Outcome: Progressing Patient has knowledge of prescribed ther

## 2017-07-22 NOTE — [facility] Group Notes (Signed)
[place] Group Notes:  (Nursing/MHT/Case Management/Adjunct)  Date:  07/22/2017  Time:  9:53 AM  Type of Therapy:  Psychoeducational Skills  Participation Level:  Active  Participation Quality:  Attentive and Sharing  Affect:  Flat  Cognitive:  Lacking  Insight:  Limited  Engagement in Group:  Engaged  Modes of Intervention:  Discussion and Education  Summary of Progress/Problems:  Charise Killian 07/22/2017, 9:53 AM

## 2017-07-22 NOTE — Progress Notes (Signed)
Patient has verbalized concerns of not liking the liquid lithium; as, "I have been using the tablets; the liquid makes my ears ring; and I have lost weight. Patient also verbalized concern about having a H/O glaucoma; and stating, "I need eye drops; my peripheral vision is off; I'm losing my eye sight." Patient awakened this A.M. Stating, "I can't see."

## 2017-07-22 NOTE — Progress Notes (Signed)
Denies SI/HI/AVH.  Affect flat.  Thought content logical and coherent.  Medication and group compliant.  Good appetite.  Maintaining personal care chores appropriately.  Support and encouragement offered.  Safety maintained.

## 2017-07-22 NOTE — Progress Notes (Signed)
[facility] MD Progress Note  07/22/2017 7:07 AM Autumn Autumn [name]  MRN:  [id number] Subjective:   Autumn Autumn [name] is a 41 year old female with a history of schizoaffective disorder bipolar type admitted floridly psychotic, very paranoid. She has been improving slowly on a combination of Abilify and Zyprexa (allergic to Haldol) and Lithium and Depakote.   07/19/2017. Autumn Autumn [name] is very disorganized today. She is paranoid and believes that she needs to protect her boyfriend from apocalypse. She just broke up with him. She looks unkempt. She stays in Autumn hallways. She makes very little sense. There are no somatic complaints. She seems to take medications as prescribed. Lithium and Depakote level in Autumn morning.  07/20/2017. Autumn Autumn [name] remains tragically disorganized and paranoid. Li level 0.56, VPA 96.  9/13.2018. Autumn Autumn [name] has a good day today. She does not look frightened. She is not restless. Her thoughts are better put together. She is able to hold a brief conversation. She is better groomed.She has no complaints.   07/22/17: Autumn Autumn [name] was conversing more with this Probation officer today. She was able to hold a conversation and gave a lot of history. She is willing to go to a Suboxone clinic as an outpatient at [facility] and engage in substance abuse treatment. Time spent discussing negative consequences of substance use. Autumn [name] has been compliant with psychotropic medications and denies any physical adverse side effects associated with medications. She denies any auditory hallucinations but says sometimes she sees some shadows out of Autumn corner of her eye. She denies any current active or passive suicidal thoughts and feels like. Affect Was flat. She did sleep fairly well last night, over 6 hours. Appetite is fair.    Principal Problem: Bipolar I disorder, most recent episode manic, severe without psychotic features ([place]) Diagnosis:   Autumn [name] Active Problem List   Diagnosis Date Noted  . Bipolar I disorder, most recent  episode manic, severe without psychotic features ([place]) [F31.13] 07/05/2017  . Tobacco use disorder [F17.200] 06/27/2017  . 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 Opioid use disorder, severe, dependence ([place]) [F11.20] 06/27/2017  . Cannabis use disorder, severe, dependence ([place]) [F12.20] 06/27/2017  . Hx of glaucoma [Z86.69] 10/02/2013   Total Time spent with Autumn [name]: 30 minutes  Past Psychiatric History: Autumn [name] has been diagnosed with schizoaffective disorder, bipolar disorder, borderline personality disorder, depression and generalized anxiety disorder. She is being hospitalized here in our unit a multitude of times. She also has been [facility] at least twice. Autumn [name] says she has attempted to walk in front of cars in Autumn past. She does report history of self injury by cutting but has not engaged in this in years  She was just discharged from our unit on August 24. She returned to Autumn ER manic again on August 27.  Past Medical History:  Past Medical History:  Diagnosis Date  . Allergy    Seasonal, Ultram (seizures)  . Anemia 2005  . Bipolar 1 disorder ([place])   . Glaucoma   . Seizures ([place]) 2008    Past Surgical History:  Procedure Laterality Date  . BREAST BIOPSY Right    x 2  . CESAREAN SECTION     x 2   Family History:  Family History  Problem Relation Age of Onset  . Hypertension Mother   . Hyperlipidemia Mother   . Parkinson's disease Father    Family Psychiatric  History: denies  Social History:  History  Alcohol Use No    Comment: Ocassional  History  Drug Use    Comment: percocet -last use over 1 week ago    Social History   Social History  . Marital status: Single    Spouse name: N/A  . Number of children: N/A  . Years of education: N/A   Social History Main Topics  . Smoking status: Current Every Day Smoker    Packs/day: 1.00    Years: 9.00  . Smokeless tobacco: Never Used     Comment: Autumn [name] not  interested  . Alcohol use No     Comment: Ocassional  . Drug use: Yes     Comment: percocet -last use over 1 week ago  . Sexual activity: No   Other Topics Concern  . None   Social History Narrative  . None     Current Medications: Current Facility-Administered Medications  Medication Dose Route Frequency Provider Last Rate Last Dose  . acetaminophen (TYLENOL) tablet 650 mg  650 mg Oral Q6H PRN Clapacs, [name], MD   650 mg at 07/19/17 1716  . alum & mag hydroxide-simeth (MAALOX/MYLANTA) 200-200-20 MG/5ML suspension 30 mL  30 mL Oral Q4H PRN [name], [name], MD      . ARIPiprazole (ABILIFY) tablet 30 mg  30 mg Oral Daily [name], [name], MD   30 mg at 07/21/17 0839  . divalproex (DEPAKOTE) DR tablet 500 mg  500 mg Oral Q8H [name], [name], MD   500 mg at 07/22/17 0549  . feeding supplement (ENSURE ENLIVE) (ENSURE ENLIVE) liquid 237 mL  237 mL Oral BID BM Hildred Priest, MD   237 mL at 07/21/17 1400  . lithium citrate 300 MG/5ML solution 450 mg  450 mg Oral TID [name], [name], MD   450 mg at 07/21/17 1635  . LORazepam (ATIVAN) tablet 2 mg  2 mg Oral Q6H PRN [name], [name], MD   2 mg at 07/19/17 2154  . magnesium hydroxide (MILK OF MAGNESIA) suspension 30 mL  30 mL Oral Daily PRN [name], [name], MD      . nicotine (NICODERM CQ - dosed in mg/24 hours) patch 21 mg  21 mg Transdermal Daily [name], [name], MD   21 mg at 07/21/17 0840  . OLANZapine (ZYPREXA) tablet 30 mg  30 mg Oral QHS [name], [name], MD   30 mg at 07/21/17 2209  . ondansetron (ZOFRAN-ODT) disintegrating tablet 4 mg  4 mg Oral Q8H PRN Hildred Priest, MD      . temazepam (RESTORIL) capsule 30 mg  30 mg Oral QHS [name], [name], MD   30 mg at 07/21/17 2209  . traZODone (DESYREL) tablet 200 mg  200 mg Oral QHS [name], [name], MD   200 mg at 07/21/17 2209    Lab Results:  Results for orders placed or performed during Autumn hospital  encounter of 07/05/17 (from Autumn past 48 hour(s))  Lithium level     Status: Abnormal   Collection Time: 07/20/17  7:20 AM  Result Value Ref Range   Lithium Lvl 0.56 (L) 0.60 - 1.20 mmol/L  Valproic acid level     Status: None   Collection Time: 07/20/17  7:20 AM  Result Value Ref Range   Valproic Acid Lvl 96 50.0 - 100.0 ug/mL    Blood Alcohol level:  Lab Results  Component Value Date   ETH <5 07/04/2017   ETH 18 (H) 69/67/8938    Metabolic Disorder Labs: Lab Results  Component Value Date   HGBA1C 5.0 06/27/2017  MPG 96.8 06/27/2017   No results found for: PROLACTIN Lab Results  Component Value Date   CHOL 152 06/27/2017   TRIG 68 06/27/2017   HDL 59 06/27/2017   CHOLHDL 2.6 06/27/2017   VLDL 14 06/27/2017   LDLCALC 79 06/27/2017   LDLCALC 97 10/02/2013    Physical Findings: AIMS: Facial and Oral Movements Muscles of Facial Expression: None, normal Lips and Perioral Area: None, normal Jaw: None, normal Tongue: None, normal,Extremity Movements Upper (arms, wrists, hands, fingers): None, normal Lower (legs, knees, ankles, toes): None, normal, Trunk Movements Neck, shoulders, hips: None, normal, Overall Severity Severity of abnormal movements (highest score from questions above): None, normal Incapacitation due to abnormal movements: None, normal Autumn [name]'s awareness of abnormal movements (rate only Autumn [name]'s report): No Awareness, Dental Status Current problems with teeth and/or dentures?: Yes   Musculoskeletal: Strength & Muscle Tone: within normal limits Gait & Station: normal Autumn [name] leans: N/A  Psychiatric Specialty Exam: Physical Exam  Nursing note and vitals reviewed. Musculoskeletal: Normal range of motion.  Psychiatric: Her speech is normal and behavior is normal. Her affect is blunt. Thought content is paranoid and delusional. Cognition and memory are normal. She expresses impulsivity.    Review of Systems  Constitutional: Negative.   HENT:  Negative.   Eyes: Negative.   Respiratory: Negative.   Cardiovascular: Negative.   Gastrointestinal: Negative.   Genitourinary: Negative.   Musculoskeletal: Negative.   Skin: Negative.   Neurological: Negative.   Endo/Heme/Allergies: Negative.     Blood pressure (!) 96/58, pulse 95, temperature 98.2 F (36.8 C), temperature source Oral, resp. rate 18, height 5\' 1"  (1.549 m), weight 38.1 kg (84 lb), SpO2 99 %.Body mass index is 15.87 kg/m.  General Appearance: Disheveled  Eye Contact:  Fair  Speech:  Slow  Volume:  Decreased  Mood:  Dysphoric  Affect:  Flat  Thought Process:  Linear and Descriptions of Associations: Loose  Orientation:  Full (Time, Place, and Person)  Thought Content:  Hallucinations: None  Suicidal Thoughts:  No  Homicidal Thoughts:  No  Memory:  Immediate;   Fair Recent;   Fair Remote;   Fair  Judgement:  Fair  Insight:  Fair  Psychomotor Activity:  Decreased  Concentration:  Concentration: Poor and Attention Span: Poor  Recall:  Poor  Fund of Knowledge:  Fair  Language:  Fair  Akathisia:  No  Handed:    AIMS (if indicated):     Assets:  Armed forces logistics/support/administrative officer Social Support  ADL's:  Intact  Cognition:  WNL  Sleep:  Number of Hours: 6.3     Treatment Plan Summary: Daily contact with Autumn [name] to assess and evaluate symptoms and progress in treatment and Medication management   Autumn Autumn [name] is a 41 year old female with a history of bipolar disorder admitted for a psychotic break. She has been given various diagnoses, currently has a complicated pharmacological regimen. Overall for affective symptoms seem to be on Autumn forefront and though she is on lithium and Tegretol, they seem to be suboptimally controlled. Her next lithium level will be done tomorrow and dose can be adjusted after Autumn result. We will plan to increase Autumn dose of Abilify to control evolving manic symptoms.   1. Bipolar disorder. She will continue on Depakote 500 mg by mouth 3 times a  day, Zyprexa 30 mg by mouth nightly and lithium 450 mg by mouth 3 times a day. Autumn Autumn [name] is also on Abilify 30 mg by mouth daily for mood stabilization., lithium, Zyprexa and Abilify  for mood stabilization and psychosis. Switch Lithium CR to liquid.  Lithium level  was 96.  2. EPS. Continue benztropine 0.5 mg q day. There is a history of dystonia with abilify.  3. Insomnia. Restoril 30 mg. We will add Trazodone.  4. Amphetamines, opiates and marijuana use . Autumn Autumn [name] will be referred to intensive outpatient substance abuse upon discharge.  Autumn Autumn [name] was advised to abstain from alcohol andall illicit drugs as they may worsen mood symptoms.  5. Tobacco use disorder. Nicotine patch is available.   6. Metabolic syndrome monitoring. Lipid panel, TSH and HgbA1C were completed on 06/27/2017.  7. EKG. Sinus tachycardia with PVCs, QTc 466. will repeat EKG. Autumn Autumn [name] was on amphetamines when she was admitted.  8. Pregnancy test is negative.  9. UTI. UA. We will give fshe was given an antibiotic. Will repeat UA  10. Disposition. She will be discharged to home with her boyfriend. She will follow up with [facility] Suboxone clinic   Jay Schlichter, MD 07/22/2017, 7:07 AM

## 2017-07-23 NOTE — Progress Notes (Signed)
Denies SI/HI/AVH.  Pleasant and cooperative.  Flat affect. Medication and group compliant.  Good appetite.  Maintains personal care chores.  Support and encouragement offered.  Safety maintained.

## 2017-07-23 NOTE — B[facility] Group Notes (Signed)
[facility] Group Notes:  (Nursing/MHT/Case Management/Adjunct)  Date:  07/23/2017  Time:  5:09 AM  Type of Therapy:  Psychoeducational Skills  Participation Level:  Active  Participation Quality:  Appropriate  Affect:  Appropriate  Cognitive:  Appropriate  Insight:  Appropriate  Engagement in Group:  Engaged  Modes of Intervention:  Discussion, Socialization and Support  Summary of Progress/Problems:  [name] 07/23/2017, 5:09 AM

## 2017-07-23 NOTE — Progress Notes (Signed)
[facility] MD Progress Note  07/23/2017 3:24 PM Ruqayya Ventress  MRN:  [id number] Subjective:   Ms. Vallely is a 41 year old female with a history of schizoaffective disorder bipolar type admitted floridly psychotic, very paranoid. She has been improving slowly on a combination of Abilify and Zyprexa (allergic to Haldol) and Lithium and Depakote.   07/22/17: The patient was conversing more with this Probation officer today. She was able to hold a conversation and gave a lot of history. She is willing to go to a Suboxone clinic as an outpatient at [facility] and engage in substance abuse treatment. Time spent discussing negative consequences of substance use. Patient has been compliant with psychotropic medications and denies any physical adverse side effects associated with medications. She denies any auditory hallucinations but says sometimes she sees some shadows out of the corner of her eye. She denies any current active or passive suicidal thoughts and feels like. Affect Was flat. She did sleep fairly well last night, over 6 hours. Appetite is fair.  9/15-  Chart reviewed, discussed with nursing staff, pt seen. Pt states she is feeling better and "accepting reality", insight improving, denies AVH, denies SI/HI. Med compliant, denies side effects.   Per nursing- Denies SI/HI/AVH.  Affect flat.  Thought content logical and coherent.  Medication and group compliant.  Good appetite.  Maintaining personal care chores appropriately. D:Pt denies SI/HI/AVH. Patient's affect is flat but brightens upon approach, her mood is pleasant, and she is cooperative with treatment plan. Patient's thoughts are organized, speech is coherent. Patient appears less anxious and no confusion is noted during the shift. Patient is interacting with peers and staff appropriately.  A:Pt was offered support and encouragement. Pt was given scheduled medications. Pt was encouraged to attend groups. Q 15 minute checks were done for safety.  R:Patient  attends groups and interacts well with peers and staff. Pt is complaint with medication. Patient isreceptive to treatment and safety maintained on unit.   Principal Problem: Bipolar I disorder, most recent episode manic, severe without psychotic features ([place]) Diagnosis:   Patient Active Problem List   Diagnosis Date Noted  . Bipolar I disorder, most recent episode manic, severe without psychotic features ([place]) [F31.13] 07/05/2017  . Tobacco use disorder [F17.200] 06/27/2017  . 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 Opioid use disorder, severe, dependence ([place]) [F11.20] 06/27/2017  . Cannabis use disorder, severe, dependence ([place]) [F12.20] 06/27/2017  . Hx of glaucoma [Z86.69] 10/02/2013   Total Time spent with patient: 30 minutes  Past Psychiatric History: Patient has been diagnosed with schizoaffective disorder, bipolar disorder, borderline personality disorder, depression and generalized anxiety disorder. She is being hospitalized here in our unit a multitude of times. She also has been [facility] at least twice. Patient says she has attempted to walk in front of cars in the past. She does report history of self injury by cutting but has not engaged in this in years  She was just discharged from our unit on August 24. She returned to the ER manic again on August 27.  Past Medical History:  Past Medical History:  Diagnosis Date  . Allergy    Seasonal, Ultram (seizures)  . Anemia 2005  . Bipolar 1 disorder ([place])   . Glaucoma   . Seizures ([place]) 2008    Past Surgical History:  Procedure Laterality Date  . BREAST BIOPSY Right    x 2  . CESAREAN SECTION     x 2   Family History:  Family History  Problem Relation Age of Onset  . Hypertension Mother   . Hyperlipidemia Mother   . Parkinson's disease Father    Family Psychiatric  History: denies  Social History:  History  Alcohol Use No    Comment: Ocassional     History  Drug  Use    Comment: percocet -last use over 1 week ago    Social History   Social History  . Marital status: Single    Spouse name: N/A  . Number of children: N/A  . Years of education: N/A   Social History Main Topics  . Smoking status: Current Every Day Smoker    Packs/day: 1.00    Years: 9.00  . Smokeless tobacco: Never Used     Comment: Patient not interested  . Alcohol use No     Comment: Ocassional  . Drug use: Yes     Comment: percocet -last use over 1 week ago  . Sexual activity: No   Other Topics Concern  . None   Social History Narrative  . None     Current Medications: Current Facility-Administered Medications  Medication Dose Route Frequency Provider Last Rate Last Dose  . acetaminophen (TYLENOL) tablet 650 mg  650 mg Oral Q6H PRN Clapacs, [name], MD   650 mg at 07/22/17 2126  . alum & mag hydroxide-simeth (MAALOX/MYLANTA) 200-200-20 MG/5ML suspension 30 mL  30 mL Oral Q4H PRN [name], [name], MD      . ARIPiprazole (ABILIFY) tablet 30 mg  30 mg Oral Daily [name], [name], MD   30 mg at 07/23/17 0821  . divalproex (DEPAKOTE) DR tablet 500 mg  500 mg Oral Q8H [name], [name], MD   500 mg at 07/23/17 0647  . feeding supplement (ENSURE ENLIVE) (ENSURE ENLIVE) liquid 237 mL  237 mL Oral BID BM Hildred Priest, MD   237 mL at 07/23/17 1000  . latanoprost (XALATAN) 0.005 % ophthalmic solution 1 drop  1 drop Both Eyes QHS [name], [name], MD   1 drop at 07/22/17 2139  . lithium carbonate (LITHOBID) CR tablet 600 mg  600 mg Oral Q12H [name], [name], MD   600 mg at 07/23/17 0821  . LORazepam (ATIVAN) tablet 2 mg  2 mg Oral Q6H PRN [name], [name], MD   2 mg at 07/22/17 2343  . magnesium hydroxide (MILK OF MAGNESIA) suspension 30 mL  30 mL Oral Daily PRN [name], [name], MD      . nicotine (NICODERM CQ - dosed in mg/24 hours) patch 21 mg  21 mg Transdermal Daily [name], [name], MD   21 mg at 07/23/17 0821  .  OLANZapine (ZYPREXA) tablet 30 mg  30 mg Oral QHS [name], [name], MD   30 mg at 07/22/17 2126  . ondansetron (ZOFRAN-ODT) disintegrating tablet 4 mg  4 mg Oral Q8H PRN Hildred Priest, MD      . temazepam (RESTORIL) capsule 15 mg  15 mg Oral QHS [name], [name], MD   15 mg at 07/22/17 2126  . traZODone (DESYREL) tablet 200 mg  200 mg Oral QHS [name], [name], MD   200 mg at 07/22/17 2126    Lab Results:  No results found for this or any previous visit (from the past 48 hour(s)).  Blood Alcohol level:  Lab Results  Component Value Date   ETH <5 07/04/2017   ETH 18 (H) 37/16/9678    Metabolic Disorder Labs: Lab Results  Component Value  Date   HGBA1C 5.0 06/27/2017   MPG 96.8 06/27/2017   No results found for: PROLACTIN Lab Results  Component Value Date   CHOL 152 06/27/2017   TRIG 68 06/27/2017   HDL 59 06/27/2017   CHOLHDL 2.6 06/27/2017   VLDL 14 06/27/2017   LDLCALC 79 06/27/2017   LDLCALC 97 10/02/2013    Physical Findings: AIMS: Facial and Oral Movements Muscles of Facial Expression: None, normal Lips and Perioral Area: None, normal Jaw: None, normal Tongue: None, normal,Extremity Movements Upper (arms, wrists, hands, fingers): None, normal Lower (legs, knees, ankles, toes): None, normal, Trunk Movements Neck, shoulders, hips: None, normal, Overall Severity Severity of abnormal movements (highest score from questions above): None, normal Incapacitation due to abnormal movements: None, normal Patient's awareness of abnormal movements (rate only patient's report): No Awareness, Dental Status Current problems with teeth and/or dentures?: Yes   Musculoskeletal: Strength & Muscle Tone: within normal limits Gait & Station: normal Patient leans: N/A  Psychiatric Specialty Exam: Physical Exam  Nursing note and vitals reviewed. Musculoskeletal: Normal range of motion.  Psychiatric: Her speech is normal and behavior is normal. Her affect  is blunt. Thought content is paranoid and delusional. Cognition and memory are normal. She expresses impulsivity.    Review of Systems  Constitutional: Negative.   HENT: Negative.   Eyes: Negative.   Respiratory: Negative.   Cardiovascular: Negative.   Gastrointestinal: Negative.   Genitourinary: Negative.   Musculoskeletal: Negative.   Skin: Negative.   Neurological: Negative.   Endo/Heme/Allergies: Negative.     Blood pressure 97/74, pulse 73, temperature 97.7 F (36.5 C), temperature source Oral, resp. rate 18, height 5\' 1"  (1.549 m), weight 38.1 kg (84 lb), SpO2 92 %.Body mass index is 15.87 kg/m.  General Appearance: Disheveled  Eye Contact:  Fair  Speech:  Slow  Volume:  Decreased  Mood:  Dysphoric  Affect:  Flat  Thought Process:  Linear and Descriptions of Associations: Loose  Orientation:  Full (Time, Place, and Person)  Thought Content:  Hallucinations: None  Suicidal Thoughts:  No  Homicidal Thoughts:  No  Memory:  Immediate;   Fair Recent;   Fair Remote;   Fair  Judgement:  Fair  Insight:  Fair  Psychomotor Activity:  Decreased  Concentration:  Concentration: Poor and Attention Span: Poor  Recall:  Poor  Fund of Knowledge:  Fair  Language:  Fair  Akathisia:  No  Handed:    AIMS (if indicated):     Assets:  Armed forces logistics/support/administrative officer Social Support  ADL's:  Intact  Cognition:  WNL  Sleep:  Number of Hours: 5.15     Treatment Plan Summary: Daily contact with patient to assess and evaluate symptoms and progress in treatment and Medication management   Ms. Kallenbach is a 41 year old female with a history of bipolar disorder admitted for a psychotic break. She has been given various diagnoses, currently has a complicated pharmacological regimen. Overall for affective symptoms seem to be on the forefront and though she is on lithium and Tegretol, they seem to be suboptimally controlled. Her next lithium level will be done tomorrow and dose can be adjusted after the  result.  plan to increase the dose of Abilify to control evolving manic symptoms.  Mood improving slowly.  1. Bipolar disorder. She will continue on Depakote 500 mg by mouth 3 times a day, Zyprexa 30 mg by mouth nightly and lithium 450 mg by mouth 3 times a day. The patient is also on Abilify 30 mg by  mouth daily for mood stabilization., lithium, Zyprexa and Abilify for mood stabilization and psychosis. Switch Lithium CR to liquid.  Lithium level  was 96.  2. EPS. Continue benztropine 0.5 mg q day. There is a history of dystonia with abilify.  3. Insomnia. Restoril 30 mg. We will add Trazodone.  4. Amphetamines, opiates and marijuana use . The patient will be referred to intensive outpatient substance abuse upon discharge.  The patient was advised to abstain from alcohol andall illicit drugs as they may worsen mood symptoms.  5. Tobacco use disorder. Nicotine patch is available.   6. Metabolic syndrome monitoring. Lipid panel, TSH and HgbA1C were completed on 06/27/2017.  7. EKG. Sinus tachycardia with PVCs, QTc 466. will repeat EKG. The patient was on amphetamines when she was admitted.  8. Pregnancy test is negative.  9. UTI. UA. We will give fshe was given an antibiotic. Will repeat UA  10. Disposition. She will be discharged to home with her boyfriend. She will follow up with [facility] Suboxone clinic   [name], MD 07/23/2017, 3:24 PMPatient ID: [name], female   DOB: 1976-08-22, 41 y.o.   MRN: [id number]

## 2017-07-23 NOTE — [facility] Group Notes (Signed)
LCSW Group Therapy Note  07/23/2017 1:15pm  Type of Therapy and Topic: Group Therapy: Holding on to Grudges   Participation Level: Active   Description of Group:  In this group patients will be asked to explore and define a grudge. Patients will be guided to discuss their thoughts, feelings, and reasons as to why people have grudges. Patients will process the impact grudges have on daily life and identify thoughts and feelings related to holding grudges. Facilitator will challenge patients to identify ways to let go of grudges and the benefits this provides. Patients will be confronted to address why one struggles letting go of grudges. Lastly, patients will identify feelings and thoughts related to what life would look like without grudges. This group will be process-oriented, with patients participating in exploration of their own experiences, giving and receiving support, and processing challenge from other group members.  Therapeutic Goals:  1. Patient will identify specific grudges related to their personal life.  2. Patient will identify feelings, thoughts, and beliefs around grudges.  3. Patient will identify how one releases grudges appropriately.  4. Patient will identify situations where they could have let go of the grudge, but instead chose to hold on.   Summary of Patient Progress: Pt was active in group today and made multiple contributions to the discussion, however, not all were on topic and very well related.  Some comments were congruent and some were somewhat unrelated.   Therapeutic Modalities:  Cognitive Behavioral Therapy  Solution Focused Therapy  Motivational Interviewing  Brief Therapy   Joanne Chars, [place] 07/23/2017 2:22 PM

## 2017-07-24 MED ORDER — QUETIAPINE FUMARATE 25 MG PO TABS
50.0000 mg | ORAL_TABLET | Freq: Every evening | ORAL | Status: DC | PRN
  Administered 2017-07-26 – 2017-07-27 (×2): 50 mg via ORAL
  Filled 2017-07-24 (×2): qty 2

## 2017-07-24 MED ORDER — TRAZODONE HCL 50 MG PO TABS
150.0000 mg | ORAL_TABLET | Freq: Every day | ORAL | Status: DC
  Administered 2017-07-24 – 2017-07-28 (×5): 150 mg via ORAL
  Filled 2017-07-24 (×5): qty 1

## 2017-07-24 NOTE — Progress Notes (Signed)
Autumn [name] pleasant on approach and cooperative with treatment, she was visible in the milieu with peers, but she had minimal interaction with peers and staff on shift.  She was medication complaint on shift. Mood seems to be improving, and no behavioral issues to report on shift at this time.
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
## 2017-07-24 NOTE — Plan of Care (Signed)
Problem: Education: Goal: Utilization of techniques to improve thought processes will improve Outcome: Progressing Patient is future focused. Thought process is linear and logical during assessment. Patient verbalized desire to regulate sleep medications so she will not feel drowzy late into the morning when she has to return to work.   Problem: Self-Concept: Goal: Level of anxiety will decrease Outcome: Progressing No anxiety reported or observed. Patient calm and cooperative with staff.

## 2017-07-24 NOTE — Progress Notes (Signed)
Patient ID: Autumn [name], female   DOB: [birth year], 41 y.o.   MRN: [id number] LCSW Group Therapy Note  07/24/2017 1:00pm  Type of Therapy and Topic:  Group Therapy:  Feelings around Relapse and Recovery  Participation Level:  Active   Description of Group:    Patients in this group will discuss emotions they experience before and after a relapse. They will process how experiencing these feelings, or avoidance of experiencing them, relates to having a relapse. Facilitator will guide patients to explore emotions they have related to recovery. Patients will be encouraged to process which emotions are more powerful. They will be guided to discuss the emotional reaction significant others in their lives may have to their relapse or recovery. Patients will be assisted in exploring ways to respond to the emotions of others without this contributing to a relapse.  Therapeutic Goals: 1. Patient will identify two or more emotions that lead to a relapse for them 2. Patient will identify two emotions that result when they relapse 3. Patient will identify two emotions related to recovery 4. Patient will demonstrate ability to communicate their needs through discussion and/or role plays   Summary of Patient Progress:  Pt able to meet therapeutic goals, however at times Pt's answers disorganized and tangential.  Seems as though she is able to communicate what gets in the way of her wellness but unable to process alternatives    Therapeutic Modalities:   Cognitive Behavioral Therapy Solution-Focused Therapy Assertiveness Training Relapse Prevention Therapy   August Saucer, LCSW 07/24/2017 3:09 PM

## 2017-07-24 NOTE — Progress Notes (Signed)
[facility] MD Progress Note  07/24/2017 12:21 PM Autumn [name]  MRN:  [id number] Subjective:   Autumn [name] is a 41 year old female with a history of schizoaffective disorder bipolar type admitted floridly psychotic, very paranoid. She has been improving slowly on a combination of Abilify and Zyprexa (allergic to Haldol) and Lithium and Depakote.   9/16-  Chart reviewed, discussed with nursing staff, pt seen. Pt reports nightmare with trazodone, states she sleeps better with Seroquel. Mood and  insight improving, denies AVH, denies SI/HI. Med compliant, denies side effects.   Per nursing- Denies SI/HI/AVH.  Pleasant and cooperative.  Flat affect. Medication and group compliant.  Good appetite.  Maintains personal care chores.  No anxiety reported or observed. Patient calm and cooperative with staff.   Principal Problem: Bipolar I disorder, most recent episode manic, severe without psychotic features (Autumn [name]) Diagnosis:   Patient Active Problem List   Diagnosis Date Noted  . Bipolar I disorder, most recent episode manic, severe without psychotic features ([place]) [F31.13] 07/05/2017  . Tobacco use disorder [F17.200] 06/27/2017  . 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 Opioid use disorder, severe, dependence ([place]) [F11.20] 06/27/2017  . Cannabis use disorder, severe, dependence ([place]) [F12.20] 06/27/2017  . Hx of glaucoma [Z86.69] 10/02/2013   Total Time spent with patient: 30 minutes  Past Psychiatric History: Patient has been diagnosed with schizoaffective disorder, bipolar disorder, borderline personality disorder, depression and generalized anxiety disorder. She is being hospitalized here in our unit a multitude of times. She also has been [facility] at least twice. Patient says she has attempted to walk in front of cars in the past. She does report history of self injury by cutting but has not engaged in this in years  She was just discharged from our  unit on August 24. She returned to the ER manic again on August 27.  Past Medical History:  Past Medical History:  Diagnosis Date  . Allergy    Seasonal, Ultram (seizures)  . Anemia 2005  . Bipolar 1 disorder ([place])   . Glaucoma   . Seizures ([place]) 2008    Past Surgical History:  Procedure Laterality Date  . BREAST BIOPSY Right    x 2  . CESAREAN SECTION     x 2   Family History:  Family History  Problem Relation Age of Onset  . Hypertension Mother   . Hyperlipidemia Mother   . Parkinson's disease Father    Family Psychiatric  History: denies  Social History:  History  Alcohol Use No    Comment: Ocassional     History  Drug Use    Comment: percocet -last use over 1 week ago    Social History   Social History  . Marital status: Single    Spouse name: N/A  . Number of children: N/A  . Years of education: N/A   Social History Main Topics  . Smoking status: Current Every Day Smoker    Packs/day: 1.00    Years: 9.00  . Smokeless tobacco: Never Used     Comment: Patient not interested  . Alcohol use No     Comment: Ocassional  . Drug use: Yes     Comment: percocet -last use over 1 week ago  . Sexual activity: No   Other Topics Concern  . None   Social History Narrative  . None     Current Medications: Current Facility-Administered Medications  Medication Dose Route Frequency Provider Last Rate Last Dose  .  acetaminophen (TYLENOL) tablet 650 mg  650 mg Oral Q6H PRN Clapacs, [name], MD   650 mg at 07/23/17 1645  . alum & mag hydroxide-simeth (MAALOX/MYLANTA) 200-200-20 MG/5ML suspension 30 mL  30 mL Oral Q4H PRN [name], [name], MD      . ARIPiprazole (ABILIFY) tablet 30 mg  30 mg Oral Daily [name], [name], MD   30 mg at 07/24/17 0830  . divalproex (DEPAKOTE) DR tablet 500 mg  500 mg Oral Q8H [name], [name], MD   500 mg at 07/24/17 0627  . feeding supplement (ENSURE ENLIVE) (ENSURE ENLIVE) liquid 237 mL  237 mL Oral BID BM  Hildred Priest, MD   237 mL at 07/24/17 1015  . latanoprost (XALATAN) 0.005 % ophthalmic solution 1 drop  1 drop Both Eyes QHS [name], [name], MD   1 drop at 07/23/17 2202  . lithium carbonate (LITHOBID) CR tablet 600 mg  600 mg Oral Q12H [name], [name], MD   600 mg at 07/24/17 0830  . LORazepam (ATIVAN) tablet 2 mg  2 mg Oral Q6H PRN [name], [name], MD   2 mg at 07/22/17 2343  . magnesium hydroxide (MILK OF MAGNESIA) suspension 30 mL  30 mL Oral Daily PRN [name], [name], MD      . nicotine (NICODERM CQ - dosed in mg/24 hours) patch 21 mg  21 mg Transdermal Daily [name], [name], MD   21 mg at 07/24/17 0830  . OLANZapine (ZYPREXA) tablet 30 mg  30 mg Oral QHS [name], [name], MD   30 mg at 07/23/17 2118  . ondansetron (ZOFRAN-ODT) disintegrating tablet 4 mg  4 mg Oral Q8H PRN Hildred Priest, MD      . temazepam (RESTORIL) capsule 15 mg  15 mg Oral QHS [name], [name], MD   15 mg at 07/23/17 2119  . traZODone (DESYREL) tablet 200 mg  200 mg Oral QHS [name], [name], MD   200 mg at 07/23/17 2119    Lab Results:  No results found for this or any previous visit (from the past 48 hour(s)).  Blood Alcohol level:  Lab Results  Component Value Date   ETH <5 07/04/2017   ETH 18 (H) 41/66/0630    Metabolic Disorder Labs: Lab Results  Component Value Date   HGBA1C 5.0 06/27/2017   MPG 96.8 06/27/2017   No results found for: PROLACTIN Lab Results  Component Value Date   CHOL 152 06/27/2017   TRIG 68 06/27/2017   HDL 59 06/27/2017   CHOLHDL 2.6 06/27/2017   VLDL 14 06/27/2017   LDLCALC 79 06/27/2017   LDLCALC 97 10/02/2013    Physical Findings: AIMS: Facial and Oral Movements Muscles of Facial Expression: None, normal Lips and Perioral Area: None, normal Jaw: None, normal Tongue: None, normal,Extremity Movements Upper (arms, wrists, hands, fingers): None, normal Lower (legs, knees, ankles, toes): None,  normal, Trunk Movements Neck, shoulders, hips: None, normal, Overall Severity Severity of abnormal movements (highest score from questions above): None, normal Incapacitation due to abnormal movements: None, normal Patient's awareness of abnormal movements (rate only patient's report): No Awareness, Dental Status Current problems with teeth and/or dentures?: Yes   Musculoskeletal: Strength & Muscle Tone: within normal limits Gait & Station: normal Patient leans: N/A  Psychiatric Specialty Exam: Physical Exam  Nursing note and vitals reviewed. Musculoskeletal: Normal range of motion.  Psychiatric: Her speech is normal and behavior is normal. Her affect is blunt. Thought content is paranoid and delusional. Cognition  and memory are normal. She expresses impulsivity.    Review of Systems  Constitutional: Negative.   HENT: Negative.   Eyes: Negative.   Respiratory: Negative.   Cardiovascular: Negative.   Gastrointestinal: Negative.   Genitourinary: Negative.   Musculoskeletal: Negative.   Skin: Negative.   Neurological: Negative.   Endo/Heme/Allergies: Negative.     Blood pressure 95/76, pulse 85, temperature 97.7 F (36.5 C), temperature source Oral, resp. rate 18, height 5\' 1"  (1.549 m), weight 38.1 kg (84 lb), SpO2 92 %.Body mass index is 15.87 kg/m.  General Appearance: Disheveled  Eye Contact:  Fair  Speech:  Slow  Volume:  Decreased  Mood:  Dysphoric  Affect:  Flat  Thought Process:  Linear and Descriptions of Associations: Loose  Orientation:  Full (Time, Place, and Person)  Thought Content:  Hallucinations: None  Suicidal Thoughts:  No  Homicidal Thoughts:  No  Memory:  Immediate;   Fair Recent;   Fair Remote;   Fair  Judgement:  Fair  Insight:  Fair  Psychomotor Activity:  Decreased  Concentration:  Concentration: Poor and Attention Span: Poor  Recall:  Poor  Fund of Knowledge:  Fair  Language:  Fair  Akathisia:  No  Handed:    AIMS (if indicated):      Assets:  Armed forces logistics/support/administrative officer Social Support  ADL's:  Intact  Cognition:  WNL  Sleep:  Number of Hours: 5.15     Treatment Plan Summary: Daily contact with patient to assess and evaluate symptoms and progress in treatment and Medication management   Ms. Merk is a 41 year old female with a history of bipolar disorder admitted for a psychotic break. She has been given various diagnoses, currently has a complicated pharmacological regimen. Overall for affective symptoms seem to be on the forefront and though she is on lithium and Tegretol, they seem to be suboptimally controlled. Her next lithium level will be done tomorrow and dose can be adjusted after the result.  plan to increase the dose of Abilify to control evolving manic symptoms.  Mood improving slowly.  1. Bipolar disorder. She will continue on Depakote 500 mg by mouth 3 times a day, Zyprexa 30 mg by mouth nightly and lithium 450 mg by mouth 3 times a day. The patient is also on Abilify 30 mg by mouth daily for mood stabilization., lithium, Zyprexa and Abilify for mood stabilization and psychosis. Switch Lithium CR to liquid.  Lithium level  was 96.  2. EPS. Continue benztropine 0.5 mg q day. There is a history of dystonia with abilify.  3. Insomnia. Restoril 30 mg. decrease Trazodone due to nightmare, will add prn Seroquel for sleep. Marland Kitchen  4. Amphetamines, opiates and marijuana use . The patient will be referred to intensive outpatient substance abuse upon discharge.  The patient was advised to abstain from alcohol andall illicit drugs as they may worsen mood symptoms.  5. Tobacco use disorder. Nicotine patch is available.   6. Metabolic syndrome monitoring. Lipid panel, TSH and HgbA1C were completed on 06/27/2017.  7. EKG. Sinus tachycardia with PVCs, QTc 466. will repeat EKG. The patient was on amphetamines when she was admitted.  8. Pregnancy test is negative.  9. UTI. UA. We will give fshe was given an antibiotic. Will  repeat UA  10. Disposition. She will be discharged to home with her boyfriend. She will follow up with [place] [facility]   [name], MD 07/24/2017, 12:21 PMPatient ID: [name], female   DOB: 1976-10-23, 41 y.o.  MRN: [id number] Patient ID: Willistine Ferrall, female   DOB: 1976/10/27, 41 y.o.   MRN: [id number]

## 2017-07-24 NOTE — [facility] Group Notes (Signed)
[place] Group Notes:  (Nursing/MHT/Case Management/Adjunct)  Date:  07/24/2017  Time:  5:13 AM  Type of Therapy:  Psychoeducational Skills  Participation Level:  Active  Participation Quality:  Appropriate, Attentive and Sharing  Affect:  Appropriate  Cognitive:  Appropriate  Insight:  Appropriate  Engagement in Group:  Engaged  Modes of Intervention:  Discussion, Socialization and Support  Summary of Progress/Problems:  Autumn [name] 07/24/2017, 5:13 AM

## 2017-07-25 NOTE — Plan of Care (Signed)
Problem: Activity: Goal: Interest or engagement in leisure activities will improve Outcome: Progressing Attending groups, visible in the milieu.

## 2017-07-25 NOTE — Progress Notes (Signed)
[facility] MD Progress Note  07/25/2017 1:02 PM Cally Nygard  MRN:  [id number] Subjective:   Ms. Lepkowski is a 41 year old female with a history of schizoaffective disorder bipolar type admitted floridly psychotic, very paranoid. She has been improving slowly on a combination of Abilify and Zyprexa (allergic to Haldol) and Lithium and Depakote.   9/16- Chart reviewed, discussed with nursing staff, pt seen. Pt reports nightmare with trazodone, states she sleeps better with Seroquel. Mood and  insight improving, denies AVH, denies SI/HI. Med compliant, denies side effects.   07/25/2017. Ms. Woolen is still disorganized but caler. She no longer has excessive energy. She is able to keep a conversation and has been asking about short term disability. She takes medications as prescribed. Excellent program participation.   Per nursing- [place] pleasant on approach and cooperative with treatment, she was visible in the milieu with peers, but she had minimal interaction with peers and staff on shift.  She was medication complaint on shift. Mood seems to be improving, and no behavioral issues to report on shift at this time.   Principal Problem: Bipolar I disorder, most recent episode manic, severe without psychotic features ([place]) Diagnosis:   Patient Active Problem List   Diagnosis Date Noted  . Bipolar I disorder, most recent episode manic, severe without psychotic features ([place]) [F31.13] 07/05/2017  . Tobacco use disorder [F17.200] 06/27/2017  . 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 Opioid use disorder, severe, dependence ([place]) [F11.20] 06/27/2017  . Cannabis use disorder, severe, dependence ([place]) [F12.20] 06/27/2017  . Hx of glaucoma [Z86.69] 10/02/2013   Total Time spent with patient: 30 minutes  Past Psychiatric History: Patient has been diagnosed with schizoaffective disorder, bipolar disorder, borderline personality disorder, depression and generalized anxiety disorder.  She is being hospitalized here in our unit a multitude of times. She also has been [facility] at least twice. Patient says she has attempted to walk in front of cars in the past. She does report history of self injury by cutting but has not engaged in this in years  She was just discharged from our unit on August 24. She returned to the ER manic again on August 27.  Past Medical History:  Past Medical History:  Diagnosis Date  . Allergy    Seasonal, Ultram (seizures)  . Anemia 2005  . Bipolar 1 disorder ([place])   . Glaucoma   . Seizures ([place]) 2008    Past Surgical History:  Procedure Laterality Date  . BREAST BIOPSY Right    x 2  . CESAREAN SECTION     x 2   Family History:  Family History  Problem Relation Age of Onset  . Hypertension Mother   . Hyperlipidemia Mother   . Parkinson's disease Father    Family Psychiatric  History: denies  Social History:  History  Alcohol Use No    Comment: Ocassional     History  Drug Use    Comment: percocet -last use over 1 week ago    Social History   Social History  . Marital status: Single    Spouse name: N/A  . Number of children: N/A  . Years of education: N/A   Social History Main Topics  . Smoking status: Current Every Day Smoker    Packs/day: 1.00    Years: 9.00  . Smokeless tobacco: Never Used     Comment: Patient not interested  . Alcohol use No     Comment: Ocassional  . Drug  use: Yes     Comment: percocet -last use over 1 week ago  . Sexual activity: No   Other Topics Concern  . None   Social History Narrative  . None     Current Medications: Current Facility-Administered Medications  Medication Dose Route Frequency Provider Last Rate Last Dose  . acetaminophen (TYLENOL) tablet 650 mg  650 mg Oral Q6H PRN Clapacs, [name], MD   650 mg at 07/23/17 1645  . alum & mag hydroxide-simeth (MAALOX/MYLANTA) 200-200-20 MG/5ML suspension 30 mL  30 mL Oral Q4H PRN Esaias Cleavenger B, MD      .  ARIPiprazole (ABILIFY) tablet 30 mg  30 mg Oral Daily Mahir Prabhakar B, MD   30 mg at 07/25/17 0808  . divalproex (DEPAKOTE) DR tablet 500 mg  500 mg Oral Q8H Doraine Schexnider B, MD   500 mg at 07/25/17 0642  . feeding supplement (ENSURE ENLIVE) (ENSURE ENLIVE) liquid 237 mL  237 mL Oral BID BM Hildred Priest, MD   237 mL at 07/25/17 1000  . latanoprost (XALATAN) 0.005 % ophthalmic solution 1 drop  1 drop Both Eyes QHS Corin Tilly B, MD   1 drop at 07/24/17 2115  . lithium carbonate (LITHOBID) CR tablet 600 mg  600 mg Oral Q12H Gwenyth Dingee B, MD   600 mg at 07/25/17 0808  . LORazepam (ATIVAN) tablet 2 mg  2 mg Oral Q6H PRN Doni Widmer B, MD   2 mg at 07/24/17 1236  . magnesium hydroxide (MILK OF MAGNESIA) suspension 30 mL  30 mL Oral Daily PRN Kalief Kattner B, MD      . nicotine (NICODERM CQ - dosed in mg/24 hours) patch 21 mg  21 mg Transdermal Daily Tyrian Peart B, MD   21 mg at 07/25/17 0808  . OLANZapine (ZYPREXA) tablet 30 mg  30 mg Oral QHS Cherrelle Plante B, MD   30 mg at 07/24/17 2114  . ondansetron (ZOFRAN-ODT) disintegrating tablet 4 mg  4 mg Oral Q8H PRN Hildred Priest, MD      . QUEtiapine (SEROQUEL) tablet 50 mg  50 mg Oral QHS PRN [name], MD      . temazepam (RESTORIL) capsule 15 mg  15 mg Oral QHS Flossie Wexler B, MD   15 mg at 07/24/17 2114  . traZODone (DESYREL) tablet 150 mg  150 mg Oral QHS [name], MD   150 mg at 07/24/17 2114    Lab Results:  No results found for this or any previous visit (from the past 48 hour(s)).  Blood Alcohol level:  Lab Results  Component Value Date   ETH <5 07/04/2017   ETH 18 (H) 52/77/8242    Metabolic Disorder Labs: Lab Results  Component Value Date   HGBA1C 5.0 06/27/2017   MPG 96.8 06/27/2017   No results found for: PROLACTIN Lab Results  Component Value Date   CHOL 152 06/27/2017   TRIG 68 06/27/2017   HDL 59 06/27/2017   CHOLHDL  2.6 06/27/2017   VLDL 14 06/27/2017   LDLCALC 79 06/27/2017   LDLCALC 97 10/02/2013    Physical Findings: AIMS: Facial and Oral Movements Muscles of Facial Expression: None, normal Lips and Perioral Area: None, normal Jaw: None, normal Tongue: None, normal,Extremity Movements Upper (arms, wrists, hands, fingers): None, normal Lower (legs, knees, ankles, toes): None, normal, Trunk Movements Neck, shoulders, hips: None, normal, Overall Severity Severity of abnormal movements (highest score from questions above): None, normal Incapacitation due to abnormal movements: None, normal  Patient's awareness of abnormal movements (rate only patient's report): No Awareness, Dental Status Current problems with teeth and/or dentures?: Yes   Musculoskeletal: Strength & Muscle Tone: within normal limits Gait & Station: normal Patient leans: N/A  Psychiatric Specialty Exam: Physical Exam  Nursing note and vitals reviewed. Musculoskeletal: Normal range of motion.  Psychiatric: Her speech is normal and behavior is normal. Her affect is blunt. Thought content is paranoid and delusional. Cognition and memory are normal. She expresses impulsivity.    Review of Systems  Constitutional: Negative.   HENT: Negative.   Eyes: Negative.   Respiratory: Negative.   Cardiovascular: Negative.   Gastrointestinal: Negative.   Genitourinary: Negative.   Musculoskeletal: Negative.   Skin: Negative.   Neurological: Negative.   Endo/Heme/Allergies: Negative.     Blood pressure (!) 83/66, pulse 90, temperature 98 F (36.7 C), temperature source Oral, resp. rate 18, height 5\' 1"  (1.549 m), weight 38.1 kg (84 lb), SpO2 92 %.Body mass index is 15.87 kg/m.  General Appearance: Disheveled  Eye Contact:  Fair  Speech:  Slow  Volume:  Decreased  Mood:  Dysphoric  Affect:  Flat  Thought Process:  Linear and Descriptions of Associations: Loose  Orientation:  Full (Time, Place, and Person)  Thought Content:   Hallucinations: None  Suicidal Thoughts:  No  Homicidal Thoughts:  No  Memory:  Immediate;   Fair Recent;   Fair Remote;   Fair  Judgement:  Fair  Insight:  Fair  Psychomotor Activity:  Decreased  Concentration:  Concentration: Poor and Attention Span: Poor  Recall:  Poor  Fund of Knowledge:  Fair  Language:  Fair  Akathisia:  No  Handed:    AIMS (if indicated):     Assets:  Armed forces logistics/support/administrative officer Social Support  ADL's:  Intact  Cognition:  WNL  Sleep:  Number of Hours: 8.25     Treatment Plan Summary: Daily contact with patient to assess and evaluate symptoms and progress in treatment and Medication management   Ms. Matos is a 41 year old female with a history of bipolar disorder admitted for a psychotic break.   1. Bipolar disorder. She is on Depakote, Zyprexa, Abilify and lithium for psychosis and mood stabilization. She is allergic to Haldol.  2. EPS. Continue benztropine 0.5 mg q day. There is a history of dystonia with abilify.  3. Insomnia. Slept better with Seroquel. I realize that this is her third antipsychotic.  4. Amphetamines, opiates and marijuana use . The patient will be referred to intensive outpatient substance abuse upon discharge.  The patient was advised to abstain from alcohol andall illicit drugs as they may worsen mood symptoms.  5. Tobacco use disorder. Nicotine patch is available.   6. Metabolic syndrome monitoring. Lipid panel, TSH and HgbA1C were completed on 06/27/2017.  7. EKG. Sinus tachycardia with PVCs, QTc 466.   8. Pregnancy test is negative.  9. UTI. We gave fosfomycin.   10. Disposition. She will be discharged to home with her boyfriend. She will follow up with [facility].    Orson Slick, MD

## 2017-07-25 NOTE — BHH Group Notes (Signed)
[place] Group Notes:  (Nursing/MHT/Case Management/Adjunct)  Date:  07/25/2017  Time:  6:36 PM  Type of Therapy:  Psychoeducational Skills  Participation Level:  Active  Participation Quality:  Appropriate and Attentive  Affect:  Appropriate  Cognitive:  Alert and Appropriate  Insight:  Appropriate and Good  Engagement in Group:  Engaged and Improving  Modes of Intervention:  Discussion, Education and Exploration  Summary of Progress/Problems:  Autumn [name] 07/25/2017, 6:36 PM

## 2017-07-25 NOTE — Tx Team (Signed)
Interdisciplinary Treatment and Diagnostic Plan Update  07/25/2017 Time of Session: 1055 AM Autumn [name] MRN: [id number]  Principal Diagnosis: Bipolar I disorder, most recent episode manic, severe without psychotic features ([place])  Secondary Diagnoses: Principal Problem:   Bipolar I disorder, most recent episode manic, severe without psychotic features ([place]) Active Problems:   Hx of glaucoma   Tobacco use disorder   Amphetamine use disorder, severe (HCC)   Opioid use disorder, severe, dependence ([place])   Cannabis use disorder, severe, dependence ([place])   Current Medications:  Current Facility-Administered Medications  Medication Dose Route Frequency Provider Last Rate Last Dose  . acetaminophen (TYLENOL) tablet 650 mg  650 mg Oral Q6H PRN Clapacs, [name], MD   650 mg at 07/23/17 1645  . alum & mag hydroxide-simeth (MAALOX/MYLANTA) 200-200-20 MG/5ML suspension 30 mL  30 mL Oral Q4H PRN [name], [name], MD      . ARIPiprazole (ABILIFY) tablet 30 mg  30 mg Oral Daily [name], [name], MD   30 mg at 07/25/17 0808  . divalproex (DEPAKOTE) DR tablet 500 mg  500 mg Oral Q8H [name], [name], MD   500 mg at 07/25/17 0642  . feeding supplement (ENSURE ENLIVE) (ENSURE ENLIVE) liquid 237 mL  237 mL Oral BID BM Hildred Priest, MD   237 mL at 07/24/17 1459  . latanoprost (XALATAN) 0.005 % ophthalmic solution 1 drop  1 drop Both Eyes QHS [name], [name], MD   1 drop at 07/24/17 2115  . lithium carbonate (LITHOBID) CR tablet 600 mg  600 mg Oral Q12H [name], [name], MD   600 mg at 07/25/17 0808  . LORazepam (ATIVAN) tablet 2 mg  2 mg Oral Q6H PRN [name], [name], MD   2 mg at 07/24/17 1236  . magnesium hydroxide (MILK OF MAGNESIA) suspension 30 mL  30 mL Oral Daily PRN [name], [name], MD      . nicotine (NICODERM CQ - dosed in mg/24 hours) patch 21 mg  21 mg Transdermal Daily [name], [name], MD   21 mg at 07/25/17 0808  . OLANZapine  (ZYPREXA) tablet 30 mg  30 mg Oral QHS [name], [name], MD   30 mg at 07/24/17 2114  . ondansetron (ZOFRAN-ODT) disintegrating tablet 4 mg  4 mg Oral Q8H PRN Hildred Priest, MD      . QUEtiapine (SEROQUEL) tablet 50 mg  50 mg Oral QHS PRN [name], MD      . temazepam (RESTORIL) capsule 15 mg  15 mg Oral QHS [name], [name], MD   15 mg at 07/24/17 2114  . traZODone (DESYREL) tablet 150 mg  150 mg Oral QHS [name], MD   150 mg at 07/24/17 2114   PTA Medications: Prescriptions Prior to Admission  Medication Sig Dispense Refill Last Dose  . carbamazepine (TEGRETOL) 200 MG tablet Take 1 tablet (200 mg total) by mouth 2 (two) times daily. 60 tablet 0 07/04/2017 at am  . citalopram (CELEXA) 20 MG tablet Take 1 tablet (20 mg total) by mouth daily. 30 tablet 0 07/04/2017 at am  . clotrimazole (GYNE-LOTRIMIN 3) 2 % vaginal cream Place 1 Applicatorful vaginally at bedtime. 21 g 0 unknown at unknown  . lithium carbonate (LITHOBID) 300 MG CR tablet Take 1 tablet (300 mg total) by mouth every 12 (twelve) hours. 60 tablet 0 07/04/2017 at am  . traZODone (DESYREL) 150 MG tablet Take 1 tablet (150 mg total) by mouth at bedtime. 30 tablet 0 07/03/2017 at  pm    Patient Stressors: Financial difficulties Medication change or noncompliance  Patient Strengths: Average or above average intelligence Capable of independent living Work skills  Treatment Modalities: Medication Management, Group therapy, Case management,  1 to 1 session with clinician, Psychoeducation, Recreational therapy.   Physician Treatment Plan for Primary Diagnosis: Bipolar I disorder, most recent episode manic, severe without psychotic features ([place]) Long Term Goal(s): Improvement in symptoms so as ready for discharge Improvement in symptoms so as ready for discharge   Short Term Goals: Ability to identify changes in lifestyle to reduce recurrence of condition will improve Ability to demonstrate  self-control will improve Compliance with prescribed medications will improve Ability to identify changes in lifestyle to reduce recurrence of condition will improve Ability to identify and develop effective coping behaviors will improve  Medication Management: Evaluate patient's response, side effects, and tolerance of medication regimen.  Therapeutic Interventions: 1 to 1 sessions, Unit Group sessions and Medication administration.  Evaluation of Outcomes: Progressing  Physician Treatment Plan for Secondary Diagnosis: Principal Problem:   Bipolar I disorder, most recent episode manic, severe without psychotic features ([place]) Active Problems:   Hx of glaucoma   Tobacco use disorder   Amphetamine use disorder, severe (HCC)   Opioid use disorder, severe, dependence ([place])   Cannabis use disorder, severe, dependence ([place])  Long Term Goal(s): Improvement in symptoms so as ready for discharge Improvement in symptoms so as ready for discharge   Short Term Goals: Ability to identify changes in lifestyle to reduce recurrence of condition will improve Ability to demonstrate self-control will improve Compliance with prescribed medications will improve Ability to identify changes in lifestyle to reduce recurrence of condition will improve Ability to identify and develop effective coping behaviors will improve     Medication Management: Evaluate patient's response, side effects, and tolerance of medication regimen.  Therapeutic Interventions: 1 to 1 sessions, Unit Group sessions and Medication administration.  Evaluation of Outcomes: Progressing   RN Treatment Plan for Primary Diagnosis: Bipolar I disorder, most recent episode manic, severe without psychotic features ([place]) Long Term Goal(s): Knowledge of disease and therapeutic regimen to maintain health will improve  Short Term Goals: Ability to demonstrate self-control, Ability to verbalize feelings will improve and Compliance with  prescribed medications will improve  Medication Management: RN will administer medications as ordered by provider, will assess and evaluate patient's response and provide education to patient for prescribed medication. RN will report any adverse and/or side effects to prescribing provider.  Therapeutic Interventions: 1 on 1 counseling sessions, Psychoeducation, Medication administration, Evaluate responses to treatment, Monitor vital signs and CBGs as ordered, Perform/monitor CIWA, COWS, AIMS and Fall Risk screenings as ordered, Perform wound care treatments as ordered.  Evaluation of Outcomes: Progressing   LCSW Treatment Plan for Primary Diagnosis: Bipolar I disorder, most recent episode manic, severe without psychotic features ([place]) Long Term Goal(s): Safe transition to appropriate next level of care at discharge, Engage patient in therapeutic group addressing interpersonal concerns.  Short Term Goals: Engage patient in aftercare planning with referrals and resources, Increase social support and Identify triggers associated with mental health/substance abuse issues  Therapeutic Interventions: Assess for all discharge needs, 1 to 1 time with Social worker, Explore available resources and support systems, Assess for adequacy in community support network, Educate family and significant other(s) on suicide prevention, Complete Psychosocial Assessment, Interpersonal group therapy.  Evaluation of Outcomes: Progressing   Progress in Treatment: Attending groups: Yes. Participating in groups: Yes. Taking medication as prescribed: Yes. Toleration  medication: Yes. Family/Significant other contact made: Yes, individual(s) contacted:  boyfriend Patient understands diagnosis: Yes. Discussing patient identified problems/goals with staff: Yes. Medical problems stabilized or resolved: Yes. Denies suicidal/homicidal ideation: Yes. Issues/concerns per patient self-inventory: No.  New problem(s)  identified: No, Describe:  None.  New Short Term/Long Term Goal(s): Patient stated that her goal is to take better care of her health.  Discharge Plan or Barriers: Patient agreeable to follow up with [facility].  Reason for Continuation of Hospital: disorganized thinking     Estimated Length of Stay: 3-5 days   Attendees: Patient: 07/25/2017   Physician: Dr. Bary Leriche, MD 07/25/2017   Nursing: Elige Radon, RN 07/25/2017   RN Care Manager: 07/25/2017   Social Worker: Lurline Idol, LCSW 07/25/2017   Recreational Therapist:  07/25/2017   Other:  07/25/2017   Other:  07/25/2017   Other: 07/25/2017        Scribe for Treatment Team: Joanne Chars, [place] 07/25/2017 11:49 AM

## 2017-07-25 NOTE — Progress Notes (Signed)
Affect blunted.  Thought processes continue to be somewhat disorganized. Medication and group compliant.  Visible in the milieu.  No inappropriate behavior noted.  Support offered, safety rounds maintained.

## 2017-07-25 NOTE — Progress Notes (Signed)
D:Pt denies SI/HI/AVH. Patient's affect is flat but brightens upon approach, mood is pleasant, and she is cooperative with treatment plan. Patient's thoughts are organized, speech is coherent. Patient appears less anxious, no confusion to place or situation during the shift. Patient is interacting with peers and staff appropriately.  A:Pt was offered support and encouragement. Pt was given scheduled medications. Pt was encouraged to attend groups. Q 15 minute checks were done for safety.  R:Patient attends groups and interacts well with peers and staff. Pt is complaint with medication. Patient isreceptive to treatment and safety maintained on unit.

## 2017-07-26 NOTE — B[facility] Group Notes (Signed)
[place] Group Notes:  (Nursing/MHT/Case Management/Adjunct)  Date:  07/26/2017  Time:  1:57 PM  Type of Therapy:  Psychoeducational Skills  Participation Level:  Active  Participation Quality:  Appropriate  Affect:  Appropriate  Cognitive:  Appropriate  Insight:  Appropriate  Engagement in Group:  Engaged  Modes of Intervention:  Discussion and Education  Summary of Progress/Problems:  Drake Leach 07/26/2017, 1:57 PM

## 2017-07-26 NOTE — Progress Notes (Signed)
During morning medication pass asked patient how she was doing, states "my arms are not right"  When asked what do you mean?  States "When I walk they just hang at my sides and I need to get them working right before I can leave"  1851  Pleasant and cooperative.  Medication and group compliant.  Support and encouragement offered.  Safety rounds maintained.

## 2017-07-26 NOTE — Plan of Care (Signed)
Problem: Self-Concept: Goal: Level of anxiety will decrease Outcome: Progressing No complaints of anxiety.

## 2017-07-26 NOTE — Progress Notes (Signed)
Patient ID: Autumn [name], female   DOB: Nov 12, 1975, 41 y.o.   MRN: [id number] LCSW Group Therapy Note 07/26/2017 9:00am  Type of Therapy and Topic:  Group Therapy:  Setting Goals  Participation Level:  Active  Description of Group: In this process group, patients discussed using strengths to work toward goals and address challenges.  Patients identified two positive things about themselves and one goal they were working on.  Patients were given the opportunity to share openly and support each other's plan for self-empowerment.  The group discussed the value of gratitude and were encouraged to have a daily reflection of positive characteristics or circumstances.  Patients were encouraged to identify a plan to utilize their strengths to work on current challenges and goals.  Therapeutic Goals 1. Patient will verbalize personal strengths/positive qualities and relate how these can assist with achieving desired personal goals 2. Patients will verbalize affirmation of peers plans for personal change and goal setting 3. Patients will explore the value of gratitude and positive focus as related to successful achievement of goals 4. Patients will verbalize a plan for regular reinforcement of personal positive qualities and circumstances.  Summary of Patient Progress:  Pt able to meet therapeutic goals listed above.  Pt verbalizes that her goal is to "love myself by focusing on the good things I've done."       Therapeutic Modalities Cognitive Behavioral Therapy Motivational Interviewing    August Saucer, LCSW 07/26/2017 2:47 PM

## 2017-07-26 NOTE — Progress Notes (Signed)
[facility] MD Progress Note  07/26/2017 12:11 PM Autumn [name]  MRN:  [id number] Subjective:   Autumn [name] is a 41 year old female with a history of schizoaffective disorder bipolar type admitted floridly psychotic, very paranoid. She has been improving slowly on a combination of Abilify and Zyprexa (allergic to Haldol) and Lithium and Depakote.   9/16- Chart reviewed, discussed with nursing staff, pt seen. Pt reports nightmare with trazodone, states she sleeps better with Seroquel. Mood and  insight improving, denies AVH, denies SI/HI. Med compliant, denies side effects.   07/25/2017. Autumn [name] is still disorganized but caler. She no longer has excessive energy. She is able to keep a conversation and has been asking about short term disability. She takes medications as prescribed. Excellent program participation.   07/26/2017. Autumn [name] reports improvement but is still very disorganized in her thinking. She commented to the nurse this morning that her "arms are not right because they are by her side when she walks". Spoke with her boyfriend and mother today. They are supportive and see some improvement. The patient possibly has short term disability benefits that whe would like to take advantage of. We will try to contact her employer. She takes medications and tolerates them well. Good program participation.  Per nursing- D:Pt denies SI/HI/AVH. Patient's affect is flat but brightens upon approach, mood is pleasant, and she is cooperative with treatment plan. Patient's thoughts are organized, speech is coherent. Patient appears less anxious, no confusion to place or situation during the shift. Patient is interacting with peers and staff appropriately.  A:Pt was offered support and encouragement. Pt was given scheduled medications. Pt was encouraged to attend groups. Q 15 minute checks were done for safety.  R:Patient attends groups and interacts well with peers and staff. Pt is complaint with  medication. Patient isreceptive to treatment and safety maintained on unit.  Principal Problem: Bipolar I disorder, most recent episode manic, severe without psychotic features ([place]) Diagnosis:   Patient Active Problem List   Diagnosis Date Noted  . Bipolar I disorder, most recent episode manic, severe without psychotic features ([place]) [F31.13] 07/05/2017  . Tobacco use disorder [F17.200] 06/27/2017  . 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 Opioid use disorder, severe, dependence ([place]) [F11.20] 06/27/2017  . Cannabis use disorder, severe, dependence ([place]) [F12.20] 06/27/2017  . Hx of glaucoma [Z86.69] 10/02/2013   Total Time spent with patient: 30 minutes  Past Psychiatric History: Patient has been diagnosed with schizoaffective disorder, bipolar disorder, borderline personality disorder, depression and generalized anxiety disorder. She is being hospitalized here in our unit a multitude of times. She also has been [facility] at least twice. Patient says she has attempted to walk in front of cars in the past. She does report history of self injury by cutting but has not engaged in this in years  She was just discharged from our unit on August 24. She returned to the ER manic again on August 27.  Past Medical History:  Past Medical History:  Diagnosis Date  . Allergy    Seasonal, Ultram (seizures)  . Anemia 2005  . Bipolar 1 disorder ([place])   . Glaucoma   . Seizures ([place]) 2008    Past Surgical History:  Procedure Laterality Date  . BREAST BIOPSY Right    x 2  . CESAREAN SECTION     x 2   Family History:  Family History  Problem Relation Age of Onset  . Hypertension Mother   . Hyperlipidemia  Mother   . Parkinson's disease Father    Family Psychiatric  History: denies  Social History:  History  Alcohol Use No    Comment: Ocassional     History  Drug Use    Comment: percocet -last use over 1 week ago    Social History    Social History  . Marital status: Single    Spouse name: N/A  . Number of children: N/A  . Years of education: N/A   Social History Main Topics  . Smoking status: Current Every Day Smoker    Packs/day: 1.00    Years: 9.00  . Smokeless tobacco: Never Used     Comment: Patient not interested  . Alcohol use No     Comment: Ocassional  . Drug use: Yes     Comment: percocet -last use over 1 week ago  . Sexual activity: No   Other Topics Concern  . None   Social History Narrative  . None     Current Medications: Current Facility-Administered Medications  Medication Dose Route Frequency Provider Last Rate Last Dose  . acetaminophen (TYLENOL) tablet 650 mg  650 mg Oral Q6H PRN Clapacs, [name], MD   650 mg at 07/25/17 1452  . alum & mag hydroxide-simeth (MAALOX/MYLANTA) 200-200-20 MG/5ML suspension 30 mL  30 mL Oral Q4H PRN Thelma Lorenzetti B, MD      . ARIPiprazole (ABILIFY) tablet 30 mg  30 mg Oral Daily Sreshta Cressler B, MD   30 mg at 07/26/17 0849  . divalproex (DEPAKOTE) DR tablet 500 mg  500 mg Oral Q8H Adrian Dinovo B, MD   500 mg at 07/26/17 0648  . feeding supplement (ENSURE ENLIVE) (ENSURE ENLIVE) liquid 237 mL  237 mL Oral BID BM Hildred Priest, MD   237 mL at 07/25/17 1400  . latanoprost (XALATAN) 0.005 % ophthalmic solution 1 drop  1 drop Both Eyes QHS Joshawn Crissman B, MD   1 drop at 07/25/17 2130  . lithium carbonate (LITHOBID) CR tablet 600 mg  600 mg Oral Q12H Eulalah Rupert B, MD   600 mg at 07/26/17 0849  . LORazepam (ATIVAN) tablet 2 mg  2 mg Oral Q6H PRN Nesa Distel B, MD   2 mg at 07/24/17 1236  . magnesium hydroxide (MILK OF MAGNESIA) suspension 30 mL  30 mL Oral Daily PRN Lindon Kiel B, MD      . nicotine (NICODERM CQ - dosed in mg/24 hours) patch 21 mg  21 mg Transdermal Daily Marcel Sorter B, MD   21 mg at 07/26/17 0849  . OLANZapine (ZYPREXA) tablet 30 mg  30 mg Oral QHS Victoriano Campion B, MD    30 mg at 07/25/17 2127  . ondansetron (ZOFRAN-ODT) disintegrating tablet 4 mg  4 mg Oral Q8H PRN Hildred Priest, MD      . QUEtiapine (SEROQUEL) tablet 50 mg  50 mg Oral QHS PRN [name], MD      . temazepam (RESTORIL) capsule 15 mg  15 mg Oral QHS Evolett Somarriba B, MD   15 mg at 07/25/17 2127  . traZODone (DESYREL) tablet 150 mg  150 mg Oral QHS [name], MD   150 mg at 07/25/17 2127    Lab Results:  No results found for this or any previous visit (from the past 48 hour(s)).  Blood Alcohol level:  Lab Results  Component Value Date   ETH <5 07/04/2017   ETH 18 (H) 71/69/6789    Metabolic Disorder Labs: Lab Results  Component Value Date   HGBA1C 5.0 06/27/2017   MPG 96.8 06/27/2017   No results found for: PROLACTIN Lab Results  Component Value Date   CHOL 152 06/27/2017   TRIG 68 06/27/2017   HDL 59 06/27/2017   CHOLHDL 2.6 06/27/2017   VLDL 14 06/27/2017   LDLCALC 79 06/27/2017   LDLCALC 97 10/02/2013    Physical Findings: AIMS: Facial and Oral Movements Muscles of Facial Expression: None, normal Lips and Perioral Area: None, normal Jaw: None, normal Tongue: None, normal,Extremity Movements Upper (arms, wrists, hands, fingers): None, normal Lower (legs, knees, ankles, toes): None, normal, Trunk Movements Neck, shoulders, hips: None, normal, Overall Severity Severity of abnormal movements (highest score from questions above): None, normal Incapacitation due to abnormal movements: None, normal Patient's awareness of abnormal movements (rate only patient's report): No Awareness, Dental Status Current problems with teeth and/or dentures?: Yes   Musculoskeletal: Strength & Muscle Tone: within normal limits Gait & Station: normal Patient leans: N/A  Psychiatric Specialty Exam: Physical Exam  Nursing note and vitals reviewed. Musculoskeletal: Normal range of motion.  Psychiatric: Her speech is normal and behavior is normal. Her  affect is blunt. Thought content is paranoid and delusional. Cognition and memory are normal. She expresses impulsivity.    Review of Systems  Constitutional: Negative.   HENT: Negative.   Eyes: Negative.   Respiratory: Negative.   Cardiovascular: Negative.   Gastrointestinal: Negative.   Genitourinary: Negative.   Musculoskeletal: Negative.   Skin: Negative.   Neurological: Negative.   Endo/Heme/Allergies: Negative.     Blood pressure 107/67, pulse 81, temperature 98 F (36.7 C), temperature source Oral, resp. rate 18, height 5\' 1"  (1.549 m), weight 38.1 kg (84 lb), SpO2 100 %.Body mass index is 15.87 kg/m.  General Appearance: Disheveled  Eye Contact:  Fair  Speech:  Slow  Volume:  Decreased  Mood:  Dysphoric  Affect:  Flat  Thought Process:  Linear and Descriptions of Associations: Loose  Orientation:  Full (Time, Place, and Person)  Thought Content:  Hallucinations: None  Suicidal Thoughts:  No  Homicidal Thoughts:  No  Memory:  Immediate;   Fair Recent;   Fair Remote;   Fair  Judgement:  Fair  Insight:  Fair  Psychomotor Activity:  Decreased  Concentration:  Concentration: Poor and Attention Span: Poor  Recall:  Poor  Fund of Knowledge:  Fair  Language:  Fair  Akathisia:  No  Handed:    AIMS (if indicated):     Assets:  Armed forces logistics/support/administrative officer Social Support  ADL's:  Intact  Cognition:  WNL  Sleep:  Number of Hours: 7     Treatment Plan Summary: Daily contact with patient to assess and evaluate symptoms and progress in treatment and Medication management   Autumn [name] is a 41 year old female with a history of bipolar disorder admitted for a psychotic break.   1. Bipolar disorder. She is on Depakote, Zyprexa, Abilify and lithium for psychosis and mood stabilization. She is allergic to Haldol.  2. EPS. Continue benztropine 0.5 mg q day. There is a history of dystonia with abilify.  3. Insomnia. Slept better with Seroquel. I realize that this is her third  antipsychotic.  4. Amphetamines, opiates and marijuana use . The patient will be referred to intensive outpatient substance abuse upon discharge.  The patient was advised to abstain from alcohol andall illicit drugs as they may worsen mood symptoms.  5. Tobacco use disorder. Nicotine patch is available.   6. Metabolic syndrome monitoring. Lipid  panel, TSH and HgbA1C were completed on 06/27/2017.  7. EKG. Sinus tachycardia with PVCs, QTc 466.   8. Pregnancy test is negative.  9. UTI. We gave fosfomycin.   10. Disposition. She will be discharged to home with her boyfriend. She will follow up with [facility].    Orson Slick, MD

## 2017-07-27 NOTE — Progress Notes (Signed)
[facility] MD Progress Note  07/27/2017 9:34 AM Autumn [name]  MRN:  [id number] Subjective:   Autumn [name] is a 41 year old female with a history of schizoaffective disorder bipolar type admitted floridly psychotic, very paranoid. She has been improving slowly on a combination of Abilify and Zyprexa (allergic to Haldol) and Lithium and Depakote.   9/16- Chart reviewed, discussed with nursing staff, pt seen. Pt reports nightmare with trazodone, states she sleeps better with Seroquel. Mood and  insight improving, denies AVH, denies SI/HI. Med compliant, denies side effects.   07/25/2017. Autumn [name] is still disorganized but caler. She no longer has excessive energy. She is able to keep a conversation and has been asking about short term disability. She takes medications as prescribed. Excellent program participation.   07/26/2017. Autumn [name] reports improvement but is still very disorganized in her thinking. She commented to the nurse this morning that her "arms are not right because they are by her side when she walks". Spoke with her boyfriend and mother today. They are supportive and see some improvement. The patient possibly has short term disability benefits that whe would like to take advantage of. We will try to contact her employer. She takes medications and tolerates them well. Good program participation.  07/27/2017. Autumn [name] is still paranoid and disorganized in her thinking. Today she announced that she decided yesterday to "be competent". She has difficulties engaging in conversation again and mixes up facts. She is mostly secluded to her room but goes to groups. We were able to clarify that Dr. Jerilee Hoh completed FMLA form last month. This was given to the family. There are no somatic complaints. [name] complains of interrupted sleep last night.   Per nursing- D:Pt denies SI/HI/AVH. Patient's affect is flat but brightens upon approach, mood is pleasant, and she is cooperative with treatment  plan. Patient's thoughts are disorganized with confusion noted to place and situation, frequently redirected. Patient appears less anxious, she is interacting with peers and staff appropriately.  A:Pt was offered support and encouragement. Pt was given scheduled medications. Pt was encouraged to attend groups. Q 15 minute checks were done for safety.  R:Patient attends groups and interacts well with peers and staff. Pt is complaint with medication. Patient isreceptive to treatment and safety maintained on unit.  Principal Problem: Bipolar I disorder, most recent episode manic, severe without psychotic features ([place]) Diagnosis:   Patient Active Problem List   Diagnosis Date Noted  . Bipolar I disorder, most recent episode manic, severe without psychotic features ([place]) [F31.13] 07/05/2017  . Tobacco use disorder [F17.200] 06/27/2017  . 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 Opioid use disorder, severe, dependence ([place]) [F11.20] 06/27/2017  . Cannabis use disorder, severe, dependence ([place]) [F12.20] 06/27/2017  . Hx of glaucoma [Z86.69] 10/02/2013   Total Time spent with patient: 30 minutes  Past Psychiatric History: Patient has been diagnosed with schizoaffective disorder, bipolar disorder, borderline personality disorder, depression and generalized anxiety disorder. She is being hospitalized here in our unit a multitude of times. She also has been [facility] at least twice. Patient says she has attempted to walk in front of cars in the past. She does report history of self injury by cutting but has not engaged in this in years  She was just discharged from our unit on August 24. She returned to the ER manic again on August 27.  Past Medical History:  Past Medical History:  Diagnosis Date  . Allergy    Seasonal,  Ultram (seizures)  . Anemia 2005  . Bipolar 1 disorder ([place])   . Glaucoma   . Seizures ([place]) 2008    Past Surgical History:   Procedure Laterality Date  . BREAST BIOPSY Right    x 2  . CESAREAN SECTION     x 2   Family History:  Family History  Problem Relation Age of Onset  . Hypertension Mother   . Hyperlipidemia Mother   . Parkinson's disease Father    Family Psychiatric  History: denies  Social History: She lives with her boyfriend of 17 years. She is employed in [employer], prepares part for shipment. She enjoys her job very much. She has benefits there. FMLA form was completed. She has supportive mother.  History  Alcohol Use No    Comment: Ocassional     History  Drug Use    Comment: percocet -last use over 1 week ago    Social History   Social History  . Marital status: Single    Spouse name: N/A  . Number of children: N/A  . Years of education: N/A   Social History Main Topics  . Smoking status: Current Every Day Smoker    Packs/day: 1.00    Years: 9.00  . Smokeless tobacco: Never Used     Comment: Patient not interested  . Alcohol use No     Comment: Ocassional  . Drug use: Yes     Comment: percocet -last use over 1 week ago  . Sexual activity: No   Other Topics Concern  . None   Social History Narrative  . None     Current Medications: Current Facility-Administered Medications  Medication Dose Route Frequency Provider Last Rate Last Dose  . acetaminophen (TYLENOL) tablet 650 mg  650 mg Oral Q6H PRN Clapacs, [name], MD   650 mg at 07/27/17 0830  . alum & mag hydroxide-simeth (MAALOX/MYLANTA) 200-200-20 MG/5ML suspension 30 mL  30 mL Oral Q4H PRN Abdulla Pooley B, MD      . ARIPiprazole (ABILIFY) tablet 30 mg  30 mg Oral Daily Yessenia Maillet B, MD   30 mg at 07/27/17 0830  . divalproex (DEPAKOTE) DR tablet 500 mg  500 mg Oral Q8H Kearston Putman B, MD   500 mg at 07/27/17 0637  . feeding supplement (ENSURE ENLIVE) (ENSURE ENLIVE) liquid 237 mL  237 mL Oral BID BM Hildred Priest, MD   237 mL at 07/26/17 1400  . latanoprost (XALATAN) 0.005 %  ophthalmic solution 1 drop  1 drop Both Eyes QHS Kalel Harty B, MD   1 drop at 07/26/17 2200  . lithium carbonate (LITHOBID) CR tablet 600 mg  600 mg Oral Q12H Cruz Bong B, MD   600 mg at 07/27/17 0830  . LORazepam (ATIVAN) tablet 2 mg  2 mg Oral Q6H PRN Square Jowett B, MD   2 mg at 07/24/17 1236  . magnesium hydroxide (MILK OF MAGNESIA) suspension 30 mL  30 mL Oral Daily PRN Anjuli Gemmill B, MD      . nicotine (NICODERM CQ - dosed in mg/24 hours) patch 21 mg  21 mg Transdermal Daily Kainon Varady B, MD   21 mg at 07/27/17 0831  . OLANZapine (ZYPREXA) tablet 30 mg  30 mg Oral QHS Jaymz Traywick B, MD   30 mg at 07/26/17 2156  . ondansetron (ZOFRAN-ODT) disintegrating tablet 4 mg  4 mg Oral Q8H PRN Hildred Priest, MD      . QUEtiapine (SEROQUEL) tablet 50 mg  50 mg Oral QHS PRN [name], MD   50 mg at 07/26/17 2156  . temazepam (RESTORIL) capsule 15 mg  15 mg Oral QHS Wendie Diskin B, MD   15 mg at 07/26/17 2157  . traZODone (DESYREL) tablet 150 mg  150 mg Oral QHS [name], MD   150 mg at 07/26/17 2156    Lab Results:  No results found for this or any previous visit (from the past 48 hour(s)).  Blood Alcohol level:  Lab Results  Component Value Date   ETH <5 07/04/2017   ETH 18 (H) 26/94/8546    Metabolic Disorder Labs: Lab Results  Component Value Date   HGBA1C 5.0 06/27/2017   MPG 96.8 06/27/2017   No results found for: PROLACTIN Lab Results  Component Value Date   CHOL 152 06/27/2017   TRIG 68 06/27/2017   HDL 59 06/27/2017   CHOLHDL 2.6 06/27/2017   VLDL 14 06/27/2017   LDLCALC 79 06/27/2017   LDLCALC 97 10/02/2013    Physical Findings: AIMS: Facial and Oral Movements Muscles of Facial Expression: None, normal Lips and Perioral Area: None, normal Jaw: None, normal Tongue: None, normal,Extremity Movements Upper (arms, wrists, hands, fingers): None, normal Lower (legs, knees, ankles, toes):  None, normal, Trunk Movements Neck, shoulders, hips: None, normal, Overall Severity Severity of abnormal movements (highest score from questions above): None, normal Incapacitation due to abnormal movements: None, normal Patient's awareness of abnormal movements (rate only patient's report): No Awareness, Dental Status Current problems with teeth and/or dentures?: Yes   Musculoskeletal: Strength & Muscle Tone: within normal limits Gait & Station: normal Patient leans: N/A  Psychiatric Specialty Exam: Physical Exam  Nursing note and vitals reviewed. Musculoskeletal: Normal range of motion.  Psychiatric: Her speech is normal and behavior is normal. Her affect is blunt. Thought content is paranoid and delusional. Cognition and memory are normal. She expresses impulsivity.    Review of Systems  Constitutional: Negative.   HENT: Negative.   Eyes: Negative.   Respiratory: Negative.   Cardiovascular: Negative.   Gastrointestinal: Negative.   Genitourinary: Negative.   Musculoskeletal: Negative.   Skin: Negative.   Neurological: Negative.   Endo/Heme/Allergies: Negative.   Psychiatric/Behavioral:       Paranoia, though disorganization.    Blood pressure 103/72, pulse 98, temperature 98.6 F (37 C), temperature source Oral, resp. rate 18, height 5\' 1"  (1.549 m), weight 38.1 kg (84 lb), SpO2 98 %.Body mass index is 15.87 kg/m.  General Appearance: Disheveled  Eye Contact:  Fair  Speech:  Slow  Volume:  Decreased  Mood:  Dysphoric  Affect:  Flat  Thought Process:  Linear and Descriptions of Associations: Loose  Orientation:  Full (Time, Place, and Person)  Thought Content:  Hallucinations: None  Suicidal Thoughts:  No  Homicidal Thoughts:  No  Memory:  Immediate;   Fair Recent;   Fair Remote;   Fair  Judgement:  Fair  Insight:  Fair  Psychomotor Activity:  Decreased  Concentration:  Concentration: Poor and Attention Span: Poor  Recall:  Poor  Fund of Knowledge:  Fair   Language:  Fair  Akathisia:  No  Handed:    AIMS (if indicated):     Assets:  Armed forces logistics/support/administrative officer Social Support  ADL's:  Intact  Cognition:  WNL  Sleep:  Number of Hours: 5     Treatment Plan Summary: Daily contact with patient to assess and evaluate symptoms and progress in treatment and Medication management   Ms. Bolen is a  41 year old female with a history of bipolar disorder admitted for a psychotic break.   1. Bipolar disorder. She is on Depakote, Zyprexa, Abilify and lithium for psychosis and mood stabilization. She is allergic to Haldol. LI 0.56, VPA 96 on 07/20/2017. We will recheck Li and VPA level.   2. EPS. Continue benztropine 0.5 mg q day. There is a history of dystonia with abilify.  3. Insomnia. Slept better with Seroquel. I realize that this is her third antipsychotic.  4. Amphetamines, opiates and marijuana use . The patient will be referred to intensive outpatient substance abuse upon discharge.  The patient was advised to abstain from alcohol andall illicit drugs as they may worsen mood symptoms.  5. Tobacco use disorder. Nicotine patch is available.   6. Metabolic syndrome monitoring. Lipid panel, TSH and HgbA1C were completed on 06/27/2017.  7. EKG. Sinus tachycardia with PVCs, QTc 466.   8. Pregnancy test is negative.  9. UTI. We gave fosfomycin.   10. Disposition. She will be discharged to home with her boyfriend. She will follow up with [facility].    Orson Slick, MD

## 2017-07-27 NOTE — Progress Notes (Signed)
Patient ID: Autumn [name], female   DOB: 10-03-1976, 41 y.o.   MRN: [id number] LCSW Group Therapy Note  07/27/2017 3pm  Type of Therapy/Topic:  Group Therapy:  Emotion Regulation  Participation Level:  Active   Description of Group:   The purpose of this group is to assist patients in learning to regulate negative emotions and experience positive emotions. Patients will be guided to discuss ways in which they have been vulnerable to their negative emotions. These vulnerabilities will be juxtaposed with experiences of positive emotions or situations, and patients will be challenged to use positive emotions to combat negative ones. Special emphasis will be placed on coping with negative emotions in conflict situations, and patients will process healthy conflict resolution skills.  Therapeutic Goals: 1. Patient will identify two positive emotions or experiences to reflect on in order to balance out negative emotions 2. Patient will label two or more emotions that they find the most difficult to experience 3. Patient will demonstrate positive conflict resolution skills through discussion and/or role plays  Summary of Patient Progress:  Pt able to meet therapeutic goals.  Contributes to discussion appropriately when asked, but is more reserved today.     Therapeutic Modalities:   Cognitive Behavioral Therapy Feelings Identification Dialectical Behavioral Therapy   August Saucer, LCSW 07/27/2017 5:38 PM

## 2017-07-27 NOTE — [facility] Group Notes (Signed)
[facility] Group Notes:  (Nursing/MHT/Case Management/Adjunct)  Date:  07/27/2017  Time:  11:24 AM  Type of Therapy:  Psychoeducational Skills  Participation Level:  None  Participation Quality:  Came but walked out early on in the group.  Modes of Intervention:  Discussion, Exploration and Problem-solving  Summary of Progress/Problems:  [name] 07/27/2017, 11:24 AM

## 2017-07-27 NOTE — Progress Notes (Signed)
D:Pt denies SI/HI/AVH. Patient's affect is flat but brightens upon approach, mood is pleasant, and she is cooperative with treatment plan. Patient's thoughts are disorganized with confusion noted to place and situation, frequently redirected. Patient appears less anxious, she is interacting with peers and staff appropriately.  A:Pt was offered support and encouragement. Pt was given scheduled medications. Pt was encouraged to attend groups. Q 15 minute checks were done for safety.  R:Patient attends groups and interacts well with peers and staff. Pt is complaint with medication. Patient isreceptive to treatment and safety maintained on unit.

## 2017-07-27 NOTE — Progress Notes (Signed)
Affect blank.  Disorganized.  Requires re-direction at times.  Attending groups although will leave early.  Been isolative to room today.  Medication compliant.  Fair appetite.  Support offered,  Safety rounds maintained.

## 2017-07-27 NOTE — Plan of Care (Signed)
Problem: Safety: Goal: Ability to disclose and discuss suicidal ideas will improve Outcome: Progressing Denies SI

## 2017-07-27 NOTE — Progress Notes (Signed)
CSW was given pt debit card by Nancy--card was dropped off by pt boyfriend.  Unclear as to why but it has something to do with pt short term disability.  CSW placed debit card in locker with pt other belongings.   Winferd Humphrey, MSW, LCSW Clinical Social Worker 07/27/2017 8:58 AM

## 2017-07-27 NOTE — [facility] Group Notes (Signed)
 LCSW Group Therapy Note  Date/Time: 07/26/17, 1500  Type of Therapy/Topic:  Group Therapy:  Feelings about Diagnosis  Participation Level:  Minimal   Mood: pleasant   Description of Group:    This group will allow patients to explore their thoughts and feelings about diagnoses they have received. Patients will be guided to explore their level of understanding and acceptance of these diagnoses. Facilitator will encourage patients to process their thoughts and feelings about the reactions of others to their diagnosis, and will guide patients in identifying ways to discuss their diagnosis with significant others in their lives. This group will be process-oriented, with patients participating in exploration of their own experiences as well as giving and receiving support and challenge from other group members.   Therapeutic Goals: 1. Patient will demonstrate understanding of diagnosis as evidence by identifying two or more symptoms of the disorder:  2. Patient will be able to express two feelings regarding the diagnosis 3. Patient will demonstrate ability to communicate their needs through discussion and/or role plays  Summary of Patient Progress: Group today ended up being a process group about a number of patients being frustrated due to delayed discharge.  Katia was not among this group and did not voice any frustration about the length of her stay.  She did not take part in much conversation but most likely due to her lack of frustration today.        Therapeutic Modalities:   Cognitive Behavioral Therapy Brief Therapy Feelings Identification   Lurline Idol, LCSW

## 2017-07-27 NOTE — BHH Group Notes (Signed)
[facility] Group Notes:  (Nursing/MHT/Case Management/Adjunct)  Date:  07/27/2017  Time:  1:43 AM  Type of Therapy:  Psychoeducational Skills  Participation Level:  Active  Participation Quality:  Appropriate, Attentive and Sharing  Affect:  Appropriate  Cognitive:  Appropriate  Insight:  Appropriate and Good  Engagement in Group:  Engaged  Modes of Intervention:  Discussion, Socialization and Support  Summary of Progress/Problems:  Autumn [name] 07/27/2017, 1:43 AM

## 2017-07-28 ENCOUNTER — Encounter: Payer: Self-pay | Admitting: Psychiatry

## 2017-07-28 LAB — LITHIUM LEVEL: Lithium Lvl: 0.55 mmol/L — ABNORMAL LOW (ref 0.60–1.20)

## 2017-07-28 LAB — VALPROIC ACID LEVEL: Valproic Acid Lvl: 97 ug/mL (ref 50.0–100.0)

## 2017-07-28 MED ORDER — TRAZODONE HCL 100 MG PO TABS: 200.0000 mg | ORAL_TABLET | Freq: Every day | ORAL | Status: DC

## 2017-07-28 NOTE — Progress Notes (Signed)
CSW spoke to pt boyfriend Joe regarding potential discharge tomorrow.  He thinks pt is doing much better and spoke with pt a little while ago about her plan to go back to work next week.  He thinks pt is ready for discharge and is available to pick her up tomorrow. Winferd Humphrey, MSW, LCSW Clinical Social Worker 07/28/2017 3:22 PM

## 2017-07-28 NOTE — [facility] Group Notes (Signed)
LCSW Group Therapy Note  07/28/2017 1:15pm  Type of Therapy/Topic:  Group Therapy:  Balance in Life  Participation Level:  Active  Description of Group:    This group will address the concept of balance and how it feels and looks when one is unbalanced. Patients will be encouraged to process areas in their lives that are out of balance and identify reasons for remaining unbalanced. Facilitators will guide patients in utilizing problem-solving interventions to address and correct the stressor making their life unbalanced. Understanding and applying boundaries will be explored and addressed for obtaining and maintaining a balanced life. Patients will be encouraged to explore ways to assertively make their unbalanced needs known to significant others in their lives, using other group members and facilitator for support and feedback.  Therapeutic Goals: 1. Patient will identify two or more emotions or situations they have that consume much of in their lives. 2. Patient will identify signs/triggers that life has become out of balance:  3. Patient will identify two ways to set boundaries in order to achieve balance in their lives:  4. Patient will demonstrate ability to communicate their needs through discussion and/or role plays  Summary of Patient Progress: Identified work as major source of stress/lack of balance.  Reports conflict w supervisor who was "condescending" as difficulty - used coping skills of deep breathing and "avoiding him whenever I could" as options to deal w stressors.  "Staying balanced at work is important to me because I dont want to lose my job or end up in jail."  Uses visualization of consequences of actions as way to avoid impulsive actions.    Therapeutic Modalities:   Cognitive Behavioral Therapy Solution-Focused Therapy Assertiveness Training  Beverely Pace, [place] 07/28/2017 2:16 PM

## 2017-07-28 NOTE — Progress Notes (Signed)
D: Pt denies SI/HI/AVH. Pt is pleasant and cooperative, she appears less anxious and she is interacting with peers and staff appropriately. Patient's thoughts are organized tonight no confusion to place, time and situation.   A: Pt was offered support and encouragement. Pt was given scheduled medications. Pt was encouraged to attend groups. Q 15 minute checks were done for safety.  R:Pt attends groups and interacts well with peers and staff. Pt is taking medication. Pt has no complaints.Pt receptive to treatment and safety maintained on unit.

## 2017-07-28 NOTE — Plan of Care (Signed)
Problem: Safety: Goal: Ability to disclose and discuss suicidal ideas will improve Outcome: Progressing Verbalizes feelings without difficulty.

## 2017-07-28 NOTE — Progress Notes (Signed)
D:Pt denies SI/HI/AVH. Patient's affect is flat but brightens upon approach, mood is pleasant, and she is cooperative with treatment plan. Patient's thoughts are less  Disorganized, no confusion during the shift. Patient appears less anxious, she is interacting with peers and staff appropriately.  A:Pt was offered support and encouragement. Pt was given scheduled medications. Pt was encouraged to attend groups. Q 15 minute checks were done for safety.  R:Patient attends groups and interacts well with peers and staff. Pt is complaint with medication. Patient isreceptive to treatment and safety maintained on unit.

## 2017-07-28 NOTE — Progress Notes (Signed)
[facility] MD Progress Note  07/28/2017 10:06 AM Autumn [name]  MRN:  [id number] Subjective:   Autumn [name] is a 41 year old female with a history of schizoaffective disorder bipolar type admitted floridly psychotic, very paranoid. She has been improving slowly on a combination of Abilify and Zyprexa (allergic to Haldol) and Lithium and Depakote.   9/16- Chart reviewed, discussed with nursing staff, pt seen. Pt reports nightmare with trazodone, states she sleeps better with Seroquel. Mood and  insight improving, denies AVH, denies SI/HI. Med compliant, denies side effects.   07/25/2017. Autumn [name] is still disorganized but caler. She no longer has excessive energy. She is able to keep a conversation and has been asking about short term disability. She takes medications as prescribed. Excellent program participation.   07/26/2017. Autumn [name] reports improvement but is still very disorganized in her thinking. She commented to the nurse this morning that her "arms are not right because they are by her side when she walks". Spoke with her boyfriend and mother today. They are supportive and see some improvement. The patient possibly has short term disability benefits that whe would like to take advantage of. We will try to contact her employer. She takes medications and tolerates them well. Good program participation.  07/27/2017. Autumn [name] is still paranoid and disorganized in her thinking. Today she announced that she decided yesterday to "be competent". She has difficulties engaging in conversation again and mixes up facts. She is mostly secluded to her room but goes to groups. We were able to clarify that Dr. Jerilee Hoh completed FMLA form last month. This was given to the family. There are no somatic complaints. Autumn [name] complains of interrupted sleep last night.   07/28/2017. Autumn [name] seems much improved. She still makes out of the blue comments but is no longer paranois or excessively anxious. She accepts  medications and tolerates them well. She still refuses Abilify maintena injection. She spoke with her boss and agreed to return to work on 10/27. She is asking for discharge so she could continue to recover at home. There are no somatic complaints. Sleep and appetite are fair.   Per nursing: D:Pt denies SI/HI/AVH. Patient's affect is flat but brightens upon approach, mood is pleasant, and she is cooperative with treatment plan. Patient's thoughts are less  Disorganized, no confusion during the shift. Patient appears less anxious, she is interacting with peers and staff appropriately.  A:Pt was offered support and encouragement. Pt was given scheduled medications. Pt was encouraged to attend groups. Q 15 minute checks were done for safety.  R:Patient attends groups and interacts well with peers and staff. Pt is complaint with medication. Patient isreceptive to treatment and safety maintained on unit.  Principal Problem: Bipolar I disorder, most recent episode manic, severe without psychotic features ([place]) Diagnosis:   Patient Active Problem List   Diagnosis Date Noted  . Bipolar I disorder, most recent episode manic, severe without psychotic features ([place]) [F31.13] 07/05/2017  . Tobacco use disorder [F17.200] 06/27/2017  . 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 Opioid use disorder, severe, dependence ([place]) [F11.20] 06/27/2017  . Cannabis use disorder, severe, dependence ([place]) [F12.20] 06/27/2017  . Hx of glaucoma [Z86.69] 10/02/2013   Total Time spent with patient: 30 minutes  Past Psychiatric History: Patient has been diagnosed with schizoaffective disorder, bipolar disorder, borderline personality disorder, depression and generalized anxiety disorder. She is being hospitalized here in our unit a multitude of times. She also has been [facility] at least  twice. Patient says she has attempted to walk in front of cars in the past. She does report history of  self injury by cutting but has not engaged in this in years  She was just discharged from our unit on August 24. She returned to the ER manic again on August 27.  Past Medical History:  Past Medical History:  Diagnosis Date  . Allergy    Seasonal, Ultram (seizures)  . Anemia 2005  . Bipolar 1 disorder ([place])   . Glaucoma   . Seizures ([place]) 2008    Past Surgical History:  Procedure Laterality Date  . BREAST BIOPSY Right    x 2  . CESAREAN SECTION     x 2   Family History:  Family History  Problem Relation Age of Onset  . Hypertension Mother   . Hyperlipidemia Mother   . Parkinson's disease Father    Family Psychiatric  History: denies  Social History: She lives with her boyfriend of 17 years. She is employed in [employer], prepares part for shipment. She enjoys her job very much. She has benefits there. FMLA form was completed. She has supportive mother.  History  Alcohol Use No    Comment: Ocassional     History  Drug Use    Comment: percocet -last use over 1 week ago    Social History   Social History  . Marital status: Single    Spouse name: N/A  . Number of children: N/A  . Years of education: N/A   Social History Main Topics  . Smoking status: Current Every Day Smoker    Packs/day: 1.00    Years: 9.00  . Smokeless tobacco: Never Used     Comment: Patient not interested  . Alcohol use No     Comment: Ocassional  . Drug use: Yes     Comment: percocet -last use over 1 week ago  . Sexual activity: No   Other Topics Concern  . None   Social History Narrative  . None     Current Medications: Current Facility-Administered Medications  Medication Dose Route Frequency Provider Last Rate Last Dose  . acetaminophen (TYLENOL) tablet 650 mg  650 mg Oral Q6H PRN Clapacs, [name], MD   650 mg at 07/27/17 0830  . alum & mag hydroxide-simeth (MAALOX/MYLANTA) 200-200-20 MG/5ML suspension 30 mL  30 mL Oral Q4H PRN Shakeel Disney B, MD      .  ARIPiprazole (ABILIFY) tablet 30 mg  30 mg Oral Daily Arles Rumbold B, MD   30 mg at 07/28/17 0930  . divalproex (DEPAKOTE) DR tablet 500 mg  500 mg Oral Q8H Rodd Heft B, MD   500 mg at 07/28/17 0637  . feeding supplement (ENSURE ENLIVE) (ENSURE ENLIVE) liquid 237 mL  237 mL Oral BID BM Hildred Priest, MD   237 mL at 07/27/17 1400  . latanoprost (XALATAN) 0.005 % ophthalmic solution 1 drop  1 drop Both Eyes QHS Keyanah Kozicki B, MD   1 drop at 07/27/17 2200  . lithium carbonate (LITHOBID) CR tablet 600 mg  600 mg Oral Q12H Evlyn Amason B, MD   600 mg at 07/28/17 0931  . LORazepam (ATIVAN) tablet 2 mg  2 mg Oral Q6H PRN Eleesha Purkey B, MD   2 mg at 07/24/17 1236  . magnesium hydroxide (MILK OF MAGNESIA) suspension 30 mL  30 mL Oral Daily PRN Sharryn Belding B, MD      . nicotine (NICODERM CQ - dosed in mg/24  hours) patch 21 mg  21 mg Transdermal Daily Mende Biswell B, MD   21 mg at 07/27/17 0831  . OLANZapine (ZYPREXA) tablet 30 mg  30 mg Oral QHS Jovaun Levene B, MD   30 mg at 07/27/17 2137  . ondansetron (ZOFRAN-ODT) disintegrating tablet 4 mg  4 mg Oral Q8H PRN Hildred Priest, MD      . QUEtiapine (SEROQUEL) tablet 50 mg  50 mg Oral QHS PRN [name], MD   50 mg at 07/27/17 2136  . temazepam (RESTORIL) capsule 15 mg  15 mg Oral QHS Zyion Leidner B, MD   15 mg at 07/27/17 2137  . traZODone (DESYREL) tablet 150 mg  150 mg Oral QHS [name], MD   150 mg at 07/27/17 2136    Lab Results:  Results for orders placed or performed during the hospital encounter of 07/05/17 (from the past 48 hour(s))  Lithium level     Status: Abnormal   Collection Time: 07/28/17  6:52 AM  Result Value Ref Range   Lithium Lvl 0.55 (L) 0.60 - 1.20 mmol/L  Valproic acid level     Status: None   Collection Time: 07/28/17  6:52 AM  Result Value Ref Range   Valproic Acid Lvl 97 50.0 - 100.0 ug/mL    Blood Alcohol level:   Lab Results  Component Value Date   ETH <5 07/04/2017   ETH 18 (H) 35/57/3220    Metabolic Disorder Labs: Lab Results  Component Value Date   HGBA1C 5.0 06/27/2017   MPG 96.8 06/27/2017   No results found for: PROLACTIN Lab Results  Component Value Date   CHOL 152 06/27/2017   TRIG 68 06/27/2017   HDL 59 06/27/2017   CHOLHDL 2.6 06/27/2017   VLDL 14 06/27/2017   LDLCALC 79 06/27/2017   LDLCALC 97 10/02/2013    Physical Findings: AIMS: Facial and Oral Movements Muscles of Facial Expression: None, normal Lips and Perioral Area: None, normal Jaw: None, normal Tongue: None, normal,Extremity Movements Upper (arms, wrists, hands, fingers): None, normal Lower (legs, knees, ankles, toes): None, normal, Trunk Movements Neck, shoulders, hips: None, normal, Overall Severity Severity of abnormal movements (highest score from questions above): None, normal Incapacitation due to abnormal movements: None, normal Patient's awareness of abnormal movements (rate only patient's report): No Awareness, Dental Status Current problems with teeth and/or dentures?: Yes   Musculoskeletal: Strength & Muscle Tone: within normal limits Gait & Station: normal Patient leans: N/A  Psychiatric Specialty Exam: Physical Exam  Nursing note and vitals reviewed. Musculoskeletal: Normal range of motion.  Psychiatric: Her speech is normal and behavior is normal. Her affect is blunt. Thought content is paranoid and delusional. Cognition and memory are normal. She expresses impulsivity.    Review of Systems  Constitutional: Negative.   HENT: Negative.   Eyes: Negative.   Respiratory: Negative.   Cardiovascular: Negative.   Gastrointestinal: Negative.   Genitourinary: Negative.   Musculoskeletal: Negative.   Skin: Negative.   Neurological: Negative.   Endo/Heme/Allergies: Negative.   Psychiatric/Behavioral:       Paranoia, though disorganization.    Blood pressure (!) 102/59, pulse 84,  temperature 98.4 F (36.9 C), resp. rate 18, height 5\' 1"  (1.549 m), weight 38.1 kg (84 lb), SpO2 98 %.Body mass index is 15.87 kg/m.  General Appearance: Disheveled  Eye Contact:  Fair  Speech:  Slow  Volume:  Decreased  Mood:  Dysphoric  Affect:  Flat  Thought Process:  Linear and Descriptions of Associations: Loose  Orientation:  Full (Time, Place, and Person)  Thought Content:  Hallucinations: None  Suicidal Thoughts:  No  Homicidal Thoughts:  No  Memory:  Immediate;   Fair Recent;   Fair Remote;   Fair  Judgement:  Fair  Insight:  Fair  Psychomotor Activity:  Decreased  Concentration:  Concentration: Poor and Attention Span: Poor  Recall:  Poor  Fund of Knowledge:  Fair  Language:  Fair  Akathisia:  No  Handed:    AIMS (if indicated):     Assets:  Armed forces logistics/support/administrative officer Social Support  ADL's:  Intact  Cognition:  WNL  Sleep:  Number of Hours: 7.15     Treatment Plan Summary: Daily contact with patient to assess and evaluate symptoms and progress in treatment and Medication management   Ms. Dona is a 41 year old female with a history of bipolar disorder admitted for a psychotic break.   1. Bipolar disorder. She is on Depakote, Zyprexa, Abilify and lithium for psychosis and mood stabilization. She is allergic to Haldol. LI 0.55, VPA 97 on 07/28/2017.   2. EPS. Continue benztropine 0.5 mg q day. There is a history of dystonia with abilify.  3. Insomnia. Slept better with Seroquel. I realize that this is her third antipsychotic.  4. Amphetamines, opiates and marijuana use . The patient will be referred to intensive outpatient substance abuse upon discharge.  The patient was advised to abstain from alcohol andall illicit drugs as they may worsen mood symptoms.  5. Tobacco use disorder. Nicotine patch is available.   6. Metabolic syndrome monitoring. Lipid panel, TSH and HgbA1C were completed on 06/27/2017.  7. EKG. Sinus tachycardia with PVCs, QTc 466.   8.  Pregnancy test is negative.  9. UTI. We gave fosfomycin.   10. Disposition. She will be discharged to home with her boyfriend. She will follow up with [facility].    Orson Slick, MD

## 2017-07-28 NOTE — Progress Notes (Signed)
Denies SI/HI/AVH.  Affect blunted.  Isolative to room periodically this. Interacts appropriately when in the milieu.  Scheduled medications given. 15 minute rounding maintained.  Support and encouragement offered.

## 2017-07-29 MED ORDER — LATANOPROST 0.005 % OP SOLN: 1.0000 [drp] | Freq: Every day | OPHTHALMIC | 12 refills | Status: DC

## 2017-07-29 MED ORDER — DIVALPROEX SODIUM 500 MG PO DR TAB: 750.0000 mg | DELAYED_RELEASE_TABLET | Freq: Two times a day (BID) | ORAL | Status: DC

## 2017-07-29 MED ORDER — QUETIAPINE FUMARATE 200 MG PO TABS: 200.0000 mg | ORAL_TABLET | Freq: Every evening | ORAL | 1 refills | Status: DC | PRN

## 2017-07-29 MED ORDER — LITHIUM CARBONATE ER 300 MG PO TBCR: 600.0000 mg | EXTENDED_RELEASE_TABLET | Freq: Two times a day (BID) | ORAL | 1 refills | Status: DC

## 2017-07-29 MED ORDER — OLANZAPINE 15 MG PO TABS: 30.0000 mg | ORAL_TABLET | Freq: Every day | ORAL | 1 refills | Status: DC

## 2017-07-29 MED ORDER — ARIPIPRAZOLE 30 MG PO TABS: 30.0000 mg | ORAL_TABLET | Freq: Every day | ORAL | 1 refills | Status: DC

## 2017-07-29 MED ORDER — QUETIAPINE FUMARATE 200 MG PO TABS: 200.0000 mg | ORAL_TABLET | Freq: Every evening | ORAL | Status: DC | PRN

## 2017-07-29 MED ORDER — DIVALPROEX SODIUM 250 MG PO DR TAB: 750.0000 mg | DELAYED_RELEASE_TABLET | Freq: Two times a day (BID) | ORAL | 1 refills | Status: DC

## 2017-07-29 NOTE — BHH Group Notes (Signed)
[place] Group Notes:  (Nursing/MHT/Case Management/Adjunct)  Date:  07/29/2017  Time:  10:00 AM  Type of Therapy:  Psychoeducational Skills  Participation Level:  Active  Participation Quality:  Appropriate, Attentive and Supportive  Affect:  Flat  Cognitive:  Appropriate  Insight:  Appropriate  Engagement in Group:  Engaged and Supportive  Modes of Intervention:  Discussion and Education  Summary of Progress/Problems:  Autumn [name] 07/29/2017, 10:00 AM

## 2017-07-29 NOTE — Progress Notes (Signed)
Patient dc to home with boyfriend. All belonging returned. Patient and boyfriend understand paperwork. FMLA paperwork given.

## 2017-07-29 NOTE — Progress Notes (Signed)
   07/29/17 0925  Clinical Encounter Type  Visited With Patient  Visit Type Follow-up;Spiritual support;Behavioral Health  Referral From Patient   [facility] and patient spoke about her upcoming discharge. Patient is aware that she has areas to work on in her life to keep her out of hospital. Patient has a plan and stated that she is ready to give it a go. Prayer and emotional support provided.

## 2017-07-29 NOTE — BHH Suicide Risk Assessment (Signed)
[facility] Discharge Suicide Risk Assessment   Principal Problem: Bipolar I disorder, most recent episode manic, severe with psychotic features [facility]) Discharge Diagnoses:  Patient Active Problem List   Diagnosis Date Noted  . Bipolar I disorder, most recent episode manic, severe with psychotic features ([place]) [F31.2] 07/05/2017  . Tobacco use disorder [F17.200] 06/27/2017  . 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 Opioid use disorder, severe, dependence ([place]) [F11.20] 06/27/2017  . Cannabis use disorder, severe, dependence ([place]) [F12.20] 06/27/2017  . Hx of glaucoma [Z86.69] 10/02/2013    Total Time spent with patient: 30 minutes  Musculoskeletal: Strength & Muscle Tone: within normal limits Gait & Station: normal Patient leans: N/A  Psychiatric Specialty Exam: Review of Systems  Constitutional: Negative.   HENT: Negative.   Eyes: Negative.   Respiratory: Negative.   Cardiovascular: Negative.   Gastrointestinal: Negative.   Genitourinary: Negative.   Musculoskeletal: Negative.   Skin: Negative.   Neurological: Negative.   Endo/Heme/Allergies: Negative.   Psychiatric/Behavioral: The patient has insomnia.     Blood pressure (!) 104/59, pulse 79, temperature 97.7 F (36.5 C), temperature source Oral, resp. rate 18, height 5\' 1"  (1.549 m), weight 38.1 kg (84 lb), SpO2 98 %.Body mass index is 15.87 kg/m.  General Appearance: Casual  Eye Contact::  Good  Speech:  Clear and Coherent409  Volume:  Normal  Mood:  Anxious  Affect:  Appropriate  Thought Process:  Goal Directed and Descriptions of Associations: Intact  Orientation:  Full (Time, Place, and Person)  Thought Content:  WDL  Suicidal Thoughts:  No  Homicidal Thoughts:  No  Memory:  Immediate;   Fair Recent;   Fair Remote;   Fair  Judgement:  Impaired  Insight:  Present  Psychomotor Activity:  Normal  Concentration:  Fair  Recall:  [employer] [place]  Language: Fair  Akathisia:  No   Handed:  Right  AIMS (if indicated):     Assets:  Communication Skills Desire for Improvement Financial Resources/Insurance Housing Intimacy Physical Health Resilience Social Support Vocational/Educational  Sleep:  Number of Hours: 7.5  Cognition: WNL  ADL's:  Intact   Mental Status Per Nursing Assessment::   On Admission:  NA  Demographic Factors:  Caucasian  Loss Factors: NA  Historical Factors: Family history of mental illness or substance abuse and Impulsivity  Risk Reduction Factors:   Sense of responsibility to family, Employed, Living with another person, especially a relative and Positive social support  Continued Clinical Symptoms:  Bipolar Disorder:   Mixed State  Cognitive Features That Contribute To Risk:  None    Suicide Risk:  Minimal: No identifiable suicidal ideation.  Patients presenting with no risk factors but with morbid ruminations; may be classified as minimal risk based on the severity of the depressive symptoms  Follow-up Information    [facility] on 08/01/2017.   Why:  Please meet Sherrian Divers, Peer Support Services, at [facility] on Monday, 08/01/17, at 7:30am for your intake appointment. Contact information: [place] [address] [phone]           Plan Of Care/Follow-up recommendations:  Activity:  as tolerated. Diet:  low sodium heart healty. Other:  keep follow up appointments.  Orson Slick, MD 07/29/2017, 10:10 AM
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
## 2017-07-29 NOTE — Progress Notes (Signed)
  [facility] Adult Case Management Discharge Plan :  Will you be returning to the same living situation after discharge:  Yes,  with boyfriend At discharge, do you have transportation home?: Yes,  boyfriend Do you have the ability to pay for your medications: Yes,  BCBS  Release of information consent forms completed and in the chart;  [name]  Patient to Follow up at: Follow-up Information    [place] on 08/01/2017.   Why:  Please meet Sherrian Divers, Peer Support Services, at [facility] on Monday, 08/01/17, at 7:30am for your intake appointment. Contact information: [place] [address] [phone]           Next level of care provider has access to [place] and Suicide Prevention discussed: Yes,  with boyfriend     Has patient been referred to the Quitline?: Patient refused referral  Patient has been referred for addiction treatment: Yes  Joanne Chars, [place] 07/29/2017, 10:23 AM

## 2017-07-29 NOTE — [facility] Group Notes (Signed)
[facility] Group Notes:  (Nursing/MHT/Case Management/Adjunct)  Date:  07/29/2017  Time:  12:47 AM  Type of Therapy:  Evening Wrap-up Group  Participation Level:  Active  Participation Quality:  Appropriate and Attentive  Affect:  Appropriate  Cognitive:  Alert and Appropriate  Insight:  Appropriate and Good  Engagement in Group:  Developing/Improving and Engaged  Modes of Intervention:  Discussion  Summary of Progress/Problems:  Levonne Spiller 07/29/2017, 12:47 AM

## 2017-08-31 ENCOUNTER — Emergency Department
Admission: EM | Admit: 2017-08-31 | Discharge: 2017-09-01 | Disposition: A | Discharge status: Other Acute Inpatient Hospital | Payer: BLUE CROSS/BLUE SHIELD | Attending: Emergency Medicine | Admitting: Emergency Medicine

## 2017-08-31 DIAGNOSIS — F17200 Nicotine dependence, unspecified, uncomplicated: Secondary | ICD-10-CM | POA: Diagnosis not present

## 2017-08-31 DIAGNOSIS — F1220 Cannabis dependence, uncomplicated: Secondary | ICD-10-CM | POA: Diagnosis present

## 2017-08-31 DIAGNOSIS — F312 Bipolar disorder, current episode manic severe with psychotic features: Secondary | ICD-10-CM | POA: Diagnosis not present

## 2017-08-31 DIAGNOSIS — F32A Depression, unspecified: Secondary | ICD-10-CM

## 2017-08-31 DIAGNOSIS — F329 Major depressive disorder, single episode, unspecified: Secondary | ICD-10-CM | POA: Insufficient documentation

## 2017-08-31 DIAGNOSIS — F309 Manic episode, unspecified: Secondary | ICD-10-CM | POA: Diagnosis present

## 2017-08-31 DIAGNOSIS — F419 Anxiety disorder, unspecified: Secondary | ICD-10-CM | POA: Diagnosis not present

## 2017-08-31 DIAGNOSIS — F314 Bipolar disorder, current episode depressed, severe, without psychotic features: Secondary | ICD-10-CM | POA: Diagnosis present

## 2017-08-31 DIAGNOSIS — Z8669 Personal history of other diseases of the nervous system and sense organs: Secondary | ICD-10-CM

## 2017-08-31 DIAGNOSIS — Z79899 Other long term (current) drug therapy: Secondary | ICD-10-CM | POA: Insufficient documentation

## 2017-08-31 LAB — URINALYSIS, COMPLETE (UACMP) WITH MICROSCOPIC
BACTERIA UA: NONE SEEN
BILIRUBIN URINE: NEGATIVE
GLUCOSE, UA: NEGATIVE mg/dL
KETONES UR: NEGATIVE mg/dL
LEUKOCYTES UA: NEGATIVE
NITRITE: NEGATIVE
PH: 6 (ref 5.0–8.0)
Protein, ur: NEGATIVE mg/dL
SPECIFIC GRAVITY, URINE: 1.012 (ref 1.005–1.030)

## 2017-08-31 LAB — URINE DRUG SCREEN, QUALITATIVE (ARMC ONLY)
AMPHETAMINES, UR SCREEN: NOT DETECTED
BARBITURATES, UR SCREEN: NOT DETECTED
Benzodiazepine, Ur Scrn: NOT DETECTED
COCAINE METABOLITE, UR ~~LOC~~: NOT DETECTED
Cannabinoid 50 Ng, Ur ~~LOC~~: POSITIVE — AB
MDMA (Ecstasy)Ur Screen: NOT DETECTED
METHADONE SCREEN, URINE: NOT DETECTED
OPIATE, UR SCREEN: NOT DETECTED
PHENCYCLIDINE (PCP) UR S: NOT DETECTED
Tricyclic, Ur Screen: NOT DETECTED

## 2017-08-31 LAB — CBC
HEMATOCRIT: 43.6 % (ref 35.0–47.0)
HEMOGLOBIN: 14.5 g/dL (ref 12.0–16.0)
MCH: 32.2 pg (ref 26.0–34.0)
MCHC: 33.2 g/dL (ref 32.0–36.0)
MCV: 97.2 fL (ref 80.0–100.0)
Platelets: 237 10*3/uL (ref 150–440)
RBC: 4.49 MIL/uL (ref 3.80–5.20)
RDW: 13.5 % (ref 11.5–14.5)
WBC: 12.3 10*3/uL — ABNORMAL HIGH (ref 3.6–11.0)

## 2017-08-31 LAB — COMPREHENSIVE METABOLIC PANEL
ALK PHOS: 67 U/L (ref 38–126)
ALT: 22 U/L (ref 14–54)
ANION GAP: 8 (ref 5–15)
AST: 12 U/L — ABNORMAL LOW (ref 15–41)
Albumin: 4.5 g/dL (ref 3.5–5.0)
BILIRUBIN TOTAL: 0.4 mg/dL (ref 0.3–1.2)
BUN: 7 mg/dL (ref 6–20)
CALCIUM: 9.1 mg/dL (ref 8.9–10.3)
CO2: 27 mmol/L (ref 22–32)
Chloride: 106 mmol/L (ref 101–111)
Creatinine, Ser: 0.67 mg/dL (ref 0.44–1.00)
GLUCOSE: 93 mg/dL (ref 65–99)
POTASSIUM: 3.4 mmol/L — AB (ref 3.5–5.1)
Sodium: 141 mmol/L (ref 135–145)
Total Protein: 7.4 g/dL (ref 6.5–8.1)

## 2017-08-31 LAB — POCT PREGNANCY, URINE: Preg Test, Ur: NEGATIVE

## 2017-08-31 LAB — ETHANOL

## 2017-08-31 NOTE — ED Notes (Signed)
Pt. States she got into argument with boyfriend of 16 years.  Pt. Also states she has been "stressed out at work".   Pt. States she sees a psychiatrist Dr. Karrie Meres.  Pt. States she is currently taking Abilify, Zyprexa, lithium and Depakote.  Pt. States she wants to be taken off lithium.  Pt. Is calm and cooperative at this time.

## 2017-08-31 NOTE — ED Provider Notes (Signed)
[facility] Emergency Department Provider Note  Time seen: 9:57 PM  I have reviewed the triage vital signs and the nursing notes.   HISTORY  Chief Complaint Manic Behavior    HPI Autumn [name] is a 41 y.o. female with a past medical history of bipolar, anemia, seizure disorder, substance use, presents to the emergency department under IVC.  According to the IVC the patient was at [facility] today and became very agitated and angry.  Per the IVC the patient expressed hallucinations, hearing voices alcohol use, drinking mouthwash and Orajel at times.  The patient admits to everything but signs of hearing voices.  States she was just very stressed out today.  Denies any substance use besides alcohol.  Denies any medical complaints.  Last menstrual period was approximately 4 weeks ago.   Past Medical History:  Diagnosis Date  . Allergy    Seasonal, Ultram (seizures)  . Anemia 2005  . Bipolar 1 disorder ([place])   . Glaucoma   . Seizures ([place]) 2008    Patient Active Problem List   Diagnosis Date Noted  . Bipolar I disorder, most recent episode manic, severe with psychotic features ([place]) 07/05/2017  . Tobacco use disorder 06/27/2017  . Amphetamine use disorder, severe ([place]) 06/27/2017  . Opioid use disorder, severe, dependence ([place]) 06/27/2017  . Cannabis use disorder, severe, dependence ([place]) 06/27/2017  . Hx of glaucoma 10/02/2013    Past Surgical History:  Procedure Laterality Date  . BREAST BIOPSY Right    x 2  . CESAREAN SECTION     x 2    Prior to Admission medications   Medication Sig Start Date End Date Taking? Authorizing Provider  ARIPiprazole (ABILIFY) 30 MG tablet Take 1 tablet (30 mg total) by mouth daily. 07/29/17   [name], Herma Ard B, MD  divalproex (DEPAKOTE) 250 MG DR tablet Take 3 tablets (750 mg total) by mouth every 12 (twelve) hours. 07/29/17   [name], [name], MD  latanoprost (XALATAN) 0.005 % ophthalmic solution Place 1 drop  into both eyes at bedtime. 07/29/17   [name], Herma Ard B, MD  lithium carbonate (LITHOBID) 300 MG CR tablet Take 2 tablets (600 mg total) by mouth every 12 (twelve) hours. 07/29/17   [name], [name], MD  OLANZapine (ZYPREXA) 15 MG tablet Take 2 tablets (30 mg total) by mouth at bedtime. 07/29/17   [name], Herma Ard B, MD  QUEtiapine (SEROQUEL) 200 MG tablet Take 1 tablet (200 mg total) by mouth at bedtime as needed (sleep). 07/29/17   [name], Wardell Honour, MD    Allergies  Allergen Reactions  . Haldol [Haloperidol Lactate]     Patient states that she gets stiff muscles when taking Haldol  . Ultram [Tramadol] Other (See Comments)    Seizures    Family History  Problem Relation Age of Onset  . Hypertension Mother   . Hyperlipidemia Mother   . Parkinson's disease Father     Social History Social History  Substance Use Topics  . Smoking status: Current Every Day Smoker    Packs/day: 1.00    Years: 9.00  . Smokeless tobacco: Never Used     Comment: Patient not interested  . Alcohol use No     Comment: Ocassional    Review of Systems Constitutional: Negative for fever. Cardiovascular: Negative for chest pain. Respiratory: Negative for shortness of breath. Gastrointestinal: Negative for abdominal pain Genitourinary: Last.  4 weeks ago All other ROS negative  ____________________________________________   PHYSICAL EXAM:  VITAL SIGNS:  ED Triage Vitals  Enc Vitals Group     BP 08/31/17 2102 117/83     Pulse Rate 08/31/17 2102 68     Resp 08/31/17 2102 20     Temp 08/31/17 2102 99.4 F (37.4 C)     Temp Source 08/31/17 2102 Oral     SpO2 08/31/17 2102 100 %     Weight 08/31/17 2103 107 lb (48.5 kg)     Height 08/31/17 2103 5\' 2"  (1.575 m)     Head Circumference --      Peak Flow --      Pain Score --      Pain Loc --      Pain Edu? --      Excl. in [place]? --     Constitutional: Alert and oriented. Well appearing and in no distress. Eyes: Normal  exam ENT   Head: Normocephalic and atraumatic.   Mouth/Throat: Mucous membranes are moist. Cardiovascular: Normal rate, regular rhythm. No murmur Respiratory: Normal respiratory effort without tachypnea nor retractions. Breath sounds are clear  Gastrointestinal: Soft and nontender. No distention.   Musculoskeletal: Nontender with normal range of motion in all extremities. Neurologic:  Normal speech and language. No gross focal neurologic deficits  Skin:  Skin is warm, dry and intact.  Psychiatric: Mood and affect are normal.   ____________________________________________    INITIAL IMPRESSION / ASSESSMENT AND PLAN / ED COURSE  Pertinent labs & imaging results that were available during my care of the patient were reviewed by me and considered in my medical decision making (see chart for details).  Patient presents for an IVC for manic type behavior.  Differential would include mania, depression, anxiety, stress response, substance use.  We will check labs including urine toxicology, urine pregnancy.  We will obtain psychiatric consultation we will maintain the IVC until the patient can be adequately evaluated by psychiatry.  Patient is labs are largely within normal limits.  Psychiatric disposition pending  ____________________________________________   FINAL CLINICAL IMPRESSION(S) / ED DIAGNOSES  Depression Anxiety    Harvest Dark, MD 08/31/17 2200

## 2017-08-31 NOTE — ED Triage Notes (Signed)
Pt brought in under IVC, has been arguing with boyfriend and [facility] had her committed for inappropriate behavior and hallucinations.

## 2017-08-31 NOTE — ED Notes (Signed)
Pt changed in to burgundy scrubs, clothes, socks, shoes and book bag locked in locked unit and labeled with patients sticker.

## 2017-09-01 ENCOUNTER — Encounter: Payer: Self-pay | Admitting: *Deleted

## 2017-09-01 ENCOUNTER — Inpatient Hospital Stay
Admission: AD | Admit: 2017-09-01 | Discharge: 2017-09-13 | DRG: 885 | Disposition: A | Discharge status: Home / Self Care | Payer: BLUE CROSS/BLUE SHIELD | Attending: Psychiatry | Admitting: Psychiatry

## 2017-09-01 DIAGNOSIS — N390 Urinary tract infection, site not specified: Secondary | ICD-10-CM | POA: Diagnosis present

## 2017-09-01 DIAGNOSIS — F329 Major depressive disorder, single episode, unspecified: Secondary | ICD-10-CM | POA: Diagnosis not present

## 2017-09-01 DIAGNOSIS — F17210 Nicotine dependence, cigarettes, uncomplicated: Secondary | ICD-10-CM | POA: Diagnosis present

## 2017-09-01 DIAGNOSIS — F603 Borderline personality disorder: Secondary | ICD-10-CM | POA: Diagnosis present

## 2017-09-01 DIAGNOSIS — H409 Unspecified glaucoma: Secondary | ICD-10-CM | POA: Diagnosis present

## 2017-09-01 DIAGNOSIS — Z590 Homelessness: Secondary | ICD-10-CM | POA: Diagnosis not present

## 2017-09-01 DIAGNOSIS — D352 Benign neoplasm of pituitary gland: Secondary | ICD-10-CM | POA: Diagnosis present

## 2017-09-01 DIAGNOSIS — Z915 Personal history of self-harm: Secondary | ICD-10-CM

## 2017-09-01 DIAGNOSIS — Z9114 Patient's other noncompliance with medication regimen: Secondary | ICD-10-CM | POA: Diagnosis not present

## 2017-09-01 DIAGNOSIS — F1520 Other stimulant dependence, uncomplicated: Secondary | ICD-10-CM | POA: Diagnosis present

## 2017-09-01 DIAGNOSIS — G4700 Insomnia, unspecified: Secondary | ICD-10-CM | POA: Diagnosis present

## 2017-09-01 DIAGNOSIS — F411 Generalized anxiety disorder: Secondary | ICD-10-CM | POA: Diagnosis present

## 2017-09-01 DIAGNOSIS — F1120 Opioid dependence, uncomplicated: Secondary | ICD-10-CM | POA: Diagnosis present

## 2017-09-01 DIAGNOSIS — F1220 Cannabis dependence, uncomplicated: Secondary | ICD-10-CM | POA: Diagnosis present

## 2017-09-01 DIAGNOSIS — F312 Bipolar disorder, current episode manic severe with psychotic features: Secondary | ICD-10-CM

## 2017-09-01 DIAGNOSIS — F314 Bipolar disorder, current episode depressed, severe, without psychotic features: Secondary | ICD-10-CM | POA: Diagnosis present

## 2017-09-01 DIAGNOSIS — Z79899 Other long term (current) drug therapy: Secondary | ICD-10-CM

## 2017-09-01 DIAGNOSIS — Z62810 Personal history of physical and sexual abuse in childhood: Secondary | ICD-10-CM | POA: Diagnosis present

## 2017-09-01 DIAGNOSIS — R45851 Suicidal ideations: Secondary | ICD-10-CM | POA: Diagnosis present

## 2017-09-01 DIAGNOSIS — F17200 Nicotine dependence, unspecified, uncomplicated: Secondary | ICD-10-CM | POA: Diagnosis present

## 2017-09-01 DIAGNOSIS — Z8669 Personal history of other diseases of the nervous system and sense organs: Secondary | ICD-10-CM

## 2017-09-01 DIAGNOSIS — Z9119 Patient's noncompliance with other medical treatment and regimen: Secondary | ICD-10-CM | POA: Diagnosis not present

## 2017-09-01 DIAGNOSIS — F250 Schizoaffective disorder, bipolar type: Secondary | ICD-10-CM | POA: Diagnosis present

## 2017-09-01 MED ORDER — QUETIAPINE FUMARATE 200 MG PO TABS
200.0000 mg | ORAL_TABLET | Freq: Every evening | ORAL | Status: DC | PRN
  Administered 2017-09-01: 200 mg via ORAL
  Filled 2017-09-01: qty 1

## 2017-09-01 MED ORDER — ARIPIPRAZOLE 10 MG PO TABS
30.0000 mg | ORAL_TABLET | Freq: Every day | ORAL | Status: DC
Start: 2017-09-01 — End: 2017-09-01

## 2017-09-01 MED ORDER — NICOTINE 21 MG/24HR TD PT24
21.0000 mg | MEDICATED_PATCH | Freq: Every day | TRANSDERMAL | Status: DC
  Administered 2017-09-01: 21 mg via TRANSDERMAL
  Filled 2017-09-01: qty 1

## 2017-09-01 MED ORDER — ARIPIPRAZOLE 10 MG PO TABS
30.0000 mg | ORAL_TABLET | Freq: Every day | ORAL | Status: DC
  Administered 2017-09-02 – 2017-09-13 (×12): 30 mg via ORAL
  Filled 2017-09-01 (×12): qty 3

## 2017-09-01 MED ORDER — NICOTINE 21 MG/24HR TD PT24
21.0000 mg | MEDICATED_PATCH | Freq: Every day | TRANSDERMAL | Status: DC
  Administered 2017-09-02 – 2017-09-13 (×12): 21 mg via TRANSDERMAL
  Filled 2017-09-01 (×12): qty 1

## 2017-09-01 MED ORDER — DIVALPROEX SODIUM 250 MG PO DR TAB: 750.0000 mg | DELAYED_RELEASE_TABLET | Freq: Two times a day (BID) | ORAL | Status: DC

## 2017-09-01 MED ORDER — LITHIUM CARBONATE 300 MG PO CAPS: 600.0000 mg | ORAL_CAPSULE | Freq: Two times a day (BID) | ORAL | Status: DC

## 2017-09-01 MED ORDER — ACETAMINOPHEN 325 MG PO TABS
650.0000 mg | ORAL_TABLET | Freq: Four times a day (QID) | ORAL | Status: DC | PRN
  Administered 2017-09-01 – 2017-09-02 (×2): 650 mg via ORAL
  Filled 2017-09-01 (×3): qty 2

## 2017-09-01 MED ORDER — TRAZODONE HCL 100 MG PO TABS
100.0000 mg | ORAL_TABLET | Freq: Every evening | ORAL | Status: DC | PRN
  Administered 2017-09-01: 100 mg via ORAL
  Filled 2017-09-01 (×3): qty 1

## 2017-09-01 MED ORDER — LATANOPROST 0.005 % OP SOLN
1.0000 [drp] | Freq: Every day | OPHTHALMIC | Status: DC
  Administered 2017-09-02 – 2017-09-11 (×10): 1 [drp] via OPHTHALMIC
  Filled 2017-09-01: qty 2.5

## 2017-09-01 MED ORDER — OLANZAPINE 10 MG PO TABS
30.0000 mg | ORAL_TABLET | Freq: Every day | ORAL | Status: DC
  Administered 2017-09-01 – 2017-09-12 (×12): 30 mg via ORAL
  Filled 2017-09-01 (×11): qty 3

## 2017-09-01 MED ORDER — DIVALPROEX SODIUM 500 MG PO DR TAB
750.0000 mg | DELAYED_RELEASE_TABLET | Freq: Two times a day (BID) | ORAL | Status: DC
  Administered 2017-09-01 – 2017-09-02 (×2): 750 mg via ORAL
  Filled 2017-09-01 (×2): qty 1

## 2017-09-01 MED ORDER — OLANZAPINE 10 MG PO TABS
30.0000 mg | ORAL_TABLET | Freq: Every day | ORAL | Status: DC
  Administered 2017-09-01: 30 mg via ORAL
  Filled 2017-09-01: qty 3

## 2017-09-01 MED ORDER — LATANOPROST 0.005 % OP SOLN
1.0000 [drp] | Freq: Every day | OPHTHALMIC | Status: DC
  Administered 2017-09-01: 1 [drp] via OPHTHALMIC
  Filled 2017-09-01: qty 2.5

## 2017-09-01 MED ORDER — MAGNESIUM HYDROXIDE 400 MG/5ML PO SUSP
30.0000 mL | Freq: Every day | ORAL | Status: DC | PRN
  Administered 2017-09-11 – 2017-09-12 (×2): 30 mL via ORAL
  Filled 2017-09-01 (×2): qty 30

## 2017-09-01 MED ORDER — IBUPROFEN 600 MG PO TABS
600.0000 mg | ORAL_TABLET | Freq: Four times a day (QID) | ORAL | Status: DC | PRN
  Administered 2017-09-02 – 2017-09-04 (×4): 600 mg via ORAL
  Filled 2017-09-01 (×4): qty 1

## 2017-09-01 MED ORDER — ALUM & MAG HYDROXIDE-SIMETH 200-200-20 MG/5ML PO SUSP: 30.0000 mL | ORAL | Status: DC | PRN

## 2017-09-01 MED ORDER — LITHIUM CARBONATE 300 MG PO CAPS
600.0000 mg | ORAL_CAPSULE | Freq: Two times a day (BID) | ORAL | Status: DC
  Administered 2017-09-02 – 2017-09-13 (×24): 600 mg via ORAL
  Filled 2017-09-01 (×24): qty 2

## 2017-09-01 MED ORDER — QUETIAPINE FUMARATE 200 MG PO TABS
200.0000 mg | ORAL_TABLET | Freq: Every evening | ORAL | Status: DC | PRN
  Administered 2017-09-05 – 2017-09-06 (×2): 200 mg via ORAL
  Filled 2017-09-01 (×2): qty 1

## 2017-09-01 MED ORDER — IBUPROFEN 600 MG PO TABS
600.0000 mg | ORAL_TABLET | Freq: Four times a day (QID) | ORAL | Status: DC | PRN
  Administered 2017-09-01: 600 mg via ORAL
  Filled 2017-09-01: qty 1

## 2017-09-01 NOTE — ED Notes (Signed)
Patient alert and oriented. Patient states she is here because she was not acting right in public. Patient denies SI/HI and A/V/H. Patient given meal and provided support and encouragement. Q 15 minute checks in progress and patient remains safe on unit. Monitoring of patient continues.

## 2017-09-01 NOTE — BH Assessment (Signed)
Patient is to be admitted to [facility] by Dr. Weber Cooks.  Attending Physician will be Dr. Bary Leriche.   Patient has been assigned to room 324-A, by [place].   Intake Paper Work has been signed and placed on patient chart.  ER staff is aware of the admission Lattie Haw, ER Sect.; Dr. Burlene Arnt, ER MD; Pamala Hurry, Patient's Nurse & Genella Rife, Patient Access).

## 2017-09-01 NOTE — Progress Notes (Signed)
41 year old female IVC'ed  For auditory hallucinations.  Received on the unit in scrubs.  Constricted affect. Blaming employer and boyfriend for being here.  Verbalizes that her employer had her IVC'ed because he felt that she was not safe to work.  Further states that prior to coming down was hearing voices that was telling her that she is the angel of death.   Skin assessment and body search performed.  No contraband found.  Rash noted to torso and upper part of legs.   Oriented to unit and her room.
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
## 2017-09-01 NOTE — ED Notes (Signed)
Patient urinated in bed. Bed linens changed and bed wiped down. Patient changed paper scrubs and underwear.

## 2017-09-01 NOTE — ED Notes (Signed)
Pt. To BHU from ED ambulatory without difficulty, to room  BHU3. Report from [facility] Pt. Is alert and oriented, warm and dry in no distress. Pt. Denies SI, HI, and AVH. Pt states earlier when she closed her eyes she was seeing two people and it scared her. Pt. Calm and cooperative. Pt. Made aware of security cameras and Q15 minute rounds. Pt. Encouraged to let Nursing staff know of any concerns or needs.

## 2017-09-01 NOTE — ED Provider Notes (Signed)
-----------------------------------------   7:34 AM on 09/01/2017 -----------------------------------------   Blood pressure (!) 97/49, pulse (!) 59, temperature 99.4 F (37.4 C), temperature source Oral, resp. rate 16, height 5\' 2"  (1.575 m), weight 48.5 kg (107 lb), last menstrual period 07/30/2017, SpO2 99 %.  The patient had no acute events since last update.  Calm and cooperative at this time.  Disposition is pending Psychiatry/Behavioral Medicine team recommendations.     Paulette Blanch, MD 09/01/17 [phone]
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
## 2017-09-01 NOTE — ED Notes (Signed)
Patient discharge and admitted to [facility] room 324. Report called to [facility]. Patient alert and oriented and medically stable. Patient informed of admission and voices no concerns. Patient vitals 97.8-107/53-69-100% room air. Patient belongings transported with her to unit. Patient escorted to unit by [employer] and Armed forces training and education officer.

## 2017-09-01 NOTE — ED Notes (Signed)
Pt in room stating she "is going to pull her tooth out".  When asked why, she stated she "has a hole in it and it hurts"." Im able and can knock my tooth out".  Pt asked for a toothbrush to brush her tooth to see if it will make it feel better.  Toothpaste and toothbrush given to pt, teeth brushed, and pt lying back in bed.

## 2017-09-01 NOTE — ED Notes (Signed)
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
## 2017-09-02 ENCOUNTER — Encounter: Payer: Self-pay | Admitting: Psychiatry

## 2017-09-02 DIAGNOSIS — F312 Bipolar disorder, current episode manic severe with psychotic features: Principal | ICD-10-CM

## 2017-09-02 LAB — LIPID PANEL
CHOL/HDL RATIO: 2.3 ratio
CHOLESTEROL: 165 mg/dL (ref 0–200)
HDL: 72 mg/dL (ref >40)
LDL Cholesterol: 74 mg/dL (ref 0–99)
Triglycerides: 97 mg/dL (ref <150)
VLDL: 19 mg/dL (ref 0–40)

## 2017-09-02 LAB — TSH: TSH: 2.451 u[IU]/mL (ref 0.350–4.500)

## 2017-09-02 LAB — HEMOGLOBIN A1C
Hgb A1c MFr Bld: 4.8 % (ref 4.8–5.6)
Mean Plasma Glucose: 91.06 mg/dL

## 2017-09-02 MED ORDER — CHLORDIAZEPOXIDE HCL 25 MG PO CAPS
25.0000 mg | ORAL_CAPSULE | Freq: Four times a day (QID) | ORAL | Status: AC
  Administered 2017-09-02 – 2017-09-05 (×12): 25 mg via ORAL
  Filled 2017-09-02 (×12): qty 1

## 2017-09-02 MED ORDER — DIVALPROEX SODIUM 500 MG PO DR TAB
500.0000 mg | DELAYED_RELEASE_TABLET | Freq: Three times a day (TID) | ORAL | Status: DC
  Administered 2017-09-02 – 2017-09-13 (×33): 500 mg via ORAL
  Filled 2017-09-02 (×33): qty 1

## 2017-09-02 NOTE — Progress Notes (Addendum)
Recreation Therapy Notes  Date: 10.26.18  Time: 1:00pm  Location: Craft Room  Behavioral response: N/A Topic: Goals  Intervention: Did not attend  Clinical Observations/Feedback:  Did not attend  Autumn [name] LRT/CTRS        Autumn [name] 09/02/2017 2:47 PM

## 2017-09-02 NOTE — Plan of Care (Signed)
Problem: Coping: Goal: Ability to cope will improve Outcome: Progressing Talking to staff as needed

## 2017-09-02 NOTE — Plan of Care (Signed)
Problem: Coping: Goal: Ability to verbalize feelings will improve Outcome: Progressing Hyper-verbal but expresses self well.

## 2017-09-02 NOTE — Progress Notes (Signed)
Patient is newly admitted. Adjusting well to the unit. Stayed in the dayroom with peers and attended group. Received her HS medications and went to bed. Currently sleeping. No sign of distress. Safety precautions maintained.

## 2017-09-02 NOTE — Plan of Care (Signed)
Problem: Education: Goal: Will be free of psychotic symptoms Outcome: Progressing Oriented, thought process organized
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
## 2017-09-02 NOTE — BHH Group Notes (Signed)
[facility] Group Notes:  (Nursing/MHT/Case Management/Adjunct)  Date:  09/02/2017  Time:  5:54 AM  Type of Therapy:  Psychoeducational Skills  Participation Level:  Did Not Attend  PSummary of Progress/Problems:  [name] 09/02/2017, 5:54 AM

## 2017-09-02 NOTE — Plan of Care (Signed)
Problem: Coping: Goal: Ability to verbalize frustrations and anger appropriately will improve Outcome: Progressing Cooperative, able to express her needs

## 2017-09-02 NOTE — Progress Notes (Signed)
Affect constricted.  Speech tangential.  No insight.  [place] [facility].  Support offered.  Safety maintained.  Scheduled medications given.

## 2017-09-02 NOTE — Plan of Care (Signed)
Problem: Education: Goal: Mental status will improve Outcome: Progressing Alert and oriented. Denying hallucinations, denying thoughts of self harm

## 2017-09-02 NOTE — Tx Team (Signed)
Interdisciplinary Treatment and Diagnostic Plan Update  09/02/2017 Time of Session: Autumn [name] MRN: [id number]  Principal Diagnosis: Bipolar I disorder, most recent episode manic, severe with psychotic features ([place])  Secondary Diagnoses: Principal Problem:   Bipolar I disorder, most recent episode manic, severe with psychotic features ([name]) Active Problems:   Hx of glaucoma   Tobacco use disorder   Cannabis use disorder, severe, dependence ([name])   Current Medications:  Current Facility-Administered Medications  Medication Dose Route Frequency Provider Last Rate Last Dose  . acetaminophen (TYLENOL) tablet 650 mg  650 mg Oral Q6H PRN Clapacs, [name], MD   650 mg at 09/02/17 2025  . alum & mag hydroxide-simeth (MAALOX/MYLANTA) 200-200-20 MG/5ML suspension 30 mL  30 mL Oral Q4H PRN Clapacs, [name], MD      . ARIPiprazole (ABILIFY) tablet 30 mg  30 mg Oral Daily Clapacs, [name], MD   30 mg at 09/02/17 0906  . chlordiazePOXIDE (LIBRIUM) capsule 25 mg  25 mg Oral QID [name], [name], MD      . divalproex (DEPAKOTE) DR tablet 500 mg  500 mg Oral Q8H [name], [name], MD      . ibuprofen (ADVIL,MOTRIN) tablet 600 mg  600 mg Oral Q6H PRN Clapacs, [name], MD      . latanoprost (XALATAN) 0.005 % ophthalmic solution 1 drop  1 drop Both Eyes QHS Clapacs, [name], MD      . lithium carbonate capsule 600 mg  600 mg Oral BID WC Clapacs, [name], MD   600 mg at 09/02/17 0906  . magnesium hydroxide (MILK OF MAGNESIA) suspension 30 mL  30 mL Oral Daily PRN Clapacs, [name], MD      . nicotine (NICODERM CQ - dosed in mg/24 hours) patch 21 mg  21 mg Transdermal Daily Clapacs, [name], MD   21 mg at 09/02/17 0906  . OLANZapine (ZYPREXA) tablet 30 mg  30 mg Oral QHS Clapacs, [name], MD   30 mg at 09/01/17 2237  . QUEtiapine (SEROQUEL) tablet 200 mg  200 mg Oral QHS PRN Clapacs, [name], MD      . traZODone (DESYREL) tablet 100 mg  100 mg Oral QHS PRN Clapacs, [name], MD   100 mg at  09/01/17 2238   PTA Medications: Prescriptions Prior to Admission  Medication Sig Dispense Refill Last Dose  . ARIPiprazole (ABILIFY) 30 MG tablet Take 1 tablet (30 mg total) by mouth daily. (Patient not taking: Reported on 09/01/2017) 30 tablet 1 Not Taking at unknown  . carbamazepine (TEGRETOL) 200 MG tablet Take 200 mg by mouth 2 (two) times daily.   unknown at unknown  . citalopram (CELEXA) 20 MG tablet Take 20 mg by mouth daily.   unknown at unknown  . divalproex (DEPAKOTE) 250 MG DR tablet Take 3 tablets (750 mg total) by mouth every 12 (twelve) hours. (Patient not taking: Reported on 09/01/2017) 180 tablet 1 Not Taking at unknown  . latanoprost (XALATAN) 0.005 % ophthalmic solution Place 1 drop into both eyes at bedtime. (Patient not taking: Reported on 09/01/2017) 2.5 mL 12 Not Taking at unknown  . lithium carbonate (LITHOBID) 300 MG CR tablet Take 2 tablets (600 mg total) by mouth every 12 (twelve) hours. (Patient taking differently: Take 300 mg by mouth 2 (two) times daily. ) 120 tablet 1 unknown at unknown  . OLANZapine (ZYPREXA) 15 MG tablet Take 2 tablets (30 mg total) by mouth at bedtime. (Patient not taking: Reported  on 09/01/2017) 60 tablet 1 Not Taking at unknown  . QUEtiapine (SEROQUEL) 200 MG tablet Take 1 tablet (200 mg total) by mouth at bedtime as needed (sleep). (Patient not taking: Reported on 09/01/2017) 30 tablet 1 Not Taking at Unknown time  . traZODone (DESYREL) 150 MG tablet Take 150 mg by mouth at bedtime.   unknown at unknown    Patient Stressors: Medication change or noncompliance Occupational concerns Substance abuse  Patient Strengths: Average or above average intelligence Capable of independent living  Treatment Modalities: Medication Management, Group therapy, Case management,  1 to 1 session with clinician, Psychoeducation, Recreational therapy.   Physician Treatment Plan for Primary Diagnosis: Bipolar I disorder, most recent episode manic, severe with  psychotic features ([name]) Long Term Goal(s): Improvement in symptoms so as ready for discharge Improvement in symptoms so as ready for discharge   Short Term Goals: Ability to identify changes in lifestyle to reduce recurrence of condition will improve Ability to verbalize feelings will improve Ability to disclose and discuss suicidal ideas Ability to demonstrate self-control will improve Ability to identify and develop effective coping behaviors will improve Compliance with prescribed medications will improve Ability to identify triggers associated with substance abuse/mental health issues will improve Ability to identify changes in lifestyle to reduce recurrence of condition will improve Ability to demonstrate self-control will improve Ability to identify triggers associated with substance abuse/mental health issues will improve  Medication Management: Evaluate patient's response, side effects, and tolerance of medication regimen.  Therapeutic Interventions: 1 to 1 sessions, Unit Group sessions and Medication administration.  Evaluation of Outcomes: Not Met  Physician Treatment Plan for Secondary Diagnosis: Principal Problem:   Bipolar I disorder, most recent episode manic, severe with psychotic features ([name]) Active Problems:   Hx of glaucoma   Tobacco use disorder   Cannabis use disorder, severe, dependence ([name])  Long Term Goal(s): Improvement in symptoms so as ready for discharge Improvement in symptoms so as ready for discharge   Short Term Goals: Ability to identify changes in lifestyle to reduce recurrence of condition will improve Ability to verbalize feelings will improve Ability to disclose and discuss suicidal ideas Ability to demonstrate self-control will improve Ability to identify and develop effective coping behaviors will improve Compliance with prescribed medications will improve Ability to identify triggers associated with substance abuse/mental health issues  will improve Ability to identify changes in lifestyle to reduce recurrence of condition will improve Ability to demonstrate self-control will improve Ability to identify triggers associated with substance abuse/mental health issues will improve     Medication Management: Evaluate patient's response, side effects, and tolerance of medication regimen.  Therapeutic Interventions: 1 to 1 sessions, Unit Group sessions and Medication administration.  Evaluation of Outcomes: Not Met   RN Treatment Plan for Primary Diagnosis: Bipolar I disorder, most recent episode manic, severe with psychotic features ([place]) Long Term Goal(s): Knowledge of disease and therapeutic regimen to maintain health will improve  Short Term Goals: Ability to identify and develop effective coping behaviors will improve and Compliance with prescribed medications will improve  Medication Management: RN will administer medications as ordered by provider, will assess and evaluate patient's response and provide education to patient for prescribed medication. RN will report any adverse and/or side effects to prescribing provider.  Therapeutic Interventions: 1 on 1 counseling sessions, Psychoeducation, Medication administration, Evaluate responses to treatment, Monitor vital signs and CBGs as ordered, Perform/monitor CIWA, COWS, AIMS and Fall Risk screenings as ordered, Perform wound care treatments as ordered.  Evaluation  of Outcomes: Not Met   LCSW Treatment Plan for Primary Diagnosis: Bipolar I disorder, most recent episode manic, severe with psychotic features ([place]) Long Term Goal(s): Safe transition to appropriate next level of care at discharge, Engage patient in therapeutic group addressing interpersonal concerns.  Short Term Goals: Engage patient in aftercare planning with referrals and resources, Increase social support and Increase skills for wellness and recovery  Therapeutic Interventions: Assess for all discharge  needs, 1 to 1 time with Social worker, Explore available resources and support systems, Assess for adequacy in community support network, Educate family and significant other(s) on suicide prevention, Complete Psychosocial Assessment, Interpersonal group therapy.  Evaluation of Outcomes: Not Met   Progress in Treatment: Attending groups: Yes. Participating in groups: Yes. Taking medication as prescribed: Yes. Toleration medication: Yes. Family/Significant other contact made: No, will contact:  when given permission Patient understands diagnosis: Yes. Discussing patient identified problems/goals with staff: Yes. Medical problems stabilized or resolved: Yes. Denies suicidal/homicidal ideation: Yes. Issues/concerns per patient self-inventory: No. Other: none  New problem(s) identified: No, Describe:  none  New Short Term/Long Term Goal(s):Pt goal: to act my age, not act like I'm twently.  Discharge Plan or Barriers: Pt will continue with [facility] for outpt services.  Reason for Continuation of Hospitalization: Medication stabilization Other; describe bizarre behavior  Estimated Length of Stay: 5-7 days.  Attendees: Patient:Autumn [name] 09/02/2017   Physician: Dr. Bary Leriche, MD 09/02/2017   Nursing: Elige Radon, RN 09/02/2017   RN Care Manager: 09/02/2017   Social Worker: Lurline Idol, LCSW 09/02/2017   Recreational Therapist:  09/02/2017   Other:  09/02/2017   Other:  09/02/2017   Other: 09/02/2017        Scribe for Treatment Team: Joanne Chars, [name] 09/02/2017 11:55 AM

## 2017-09-02 NOTE — BHH Group Notes (Signed)
[place] LCSW Group Therapy Note  Date/Time: 09/02/17, 0930  Type of Therapy and Topic:  Group Therapy:  Feelings around Relapse and Recovery  Participation Level:  Active   Mood:pleasant  Description of Group:    Patients in this group will discuss emotions they experience before and after a relapse. They will process how experiencing these feelings, or avoidance of experiencing them, relates to having a relapse. Facilitator will guide patients to explore emotions they have related to recovery. Patients will be encouraged to process which emotions are more powerful. They will be guided to discuss the emotional reaction significant others in their lives may have to patients' relapse or recovery. Patients will be assisted in exploring ways to respond to the emotions of others without this contributing to a relapse.  Therapeutic Goals: 1. Patient will identify two or more emotions that lead to relapse for them:  2. Patient will identify two emotions that result when they relapse:  3. Patient will identify two emotions related to recovery:  4. Patient will demonstrate ability to communicate their needs through discussion and/or role plays.   Summary of Patient Progress:Pt identified guilt as a difficult emotion to manage.  Pt participated with several comments in the group discussion about the problems with managing emotions by using substances.     Therapeutic Modalities:   Cognitive Behavioral Therapy Solution-Focused Therapy Assertiveness Training Relapse Prevention Therapy  Lurline Idol, LCSW

## 2017-09-02 NOTE — Plan of Care (Signed)
Problem: Coping: Goal: Ability to verbalize feelings will improve Outcome: Progressing Able to express thoughts and concerns as needed

## 2017-09-02 NOTE — BHH Suicide Risk Assessment (Signed)
[facility] Admission Suicide Risk Assessment   Nursing information obtained from:    Demographic factors:    Current Mental Status:    Loss Factors:    Historical Factors:    Risk Reduction Factors:     Total Time spent with patient: 1 hour Principal Problem: Bipolar I disorder, most recent episode manic, severe with psychotic features [facility]) Diagnosis:   Patient Active Problem List   Diagnosis Date Noted  . Bipolar I disorder, most recent episode manic, severe with psychotic features ([place]) [F31.2] 07/05/2017  . Tobacco use disorder [F17.200] 06/27/2017  . 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 Opioid use disorder, severe, dependence ([place]) [F11.20] 06/27/2017  . Cannabis use disorder, severe, dependence ([place]) [F12.20] 06/27/2017  . Hx of glaucoma [Z86.69] 10/02/2013   Subjective Data: psychotic break.  Continued Clinical Symptoms:  Alcohol Use Disorder Identification Test Final Score (AUDIT): 4 The "Alcohol Use Disorders Identification Test", Guidelines for Use in Primary Care, Second Edition.  World Pharmacologist [facility]). Score between 0-7:  no or low risk or alcohol related problems. Score between 8-15:  moderate risk of alcohol related problems. Score between 16-19:  high risk of alcohol related problems. Score 20 or above:  warrants further diagnostic evaluation for alcohol dependence and treatment.   CLINICAL FACTORS:   Bipolar Disorder:   Mixed State Alcohol/Substance Abuse/Dependencies Currently Psychotic   Musculoskeletal: Strength & Muscle Tone: within normal limits Gait & Station: normal Patient leans: N/A  Psychiatric Specialty Exam: Physical Exam  Nursing note and vitals reviewed. Psychiatric: Her mood appears anxious. Her affect is inappropriate. Her speech is rapid and/or pressured. She is hyperactive. Thought content is paranoid and delusional. Cognition and memory are normal. She expresses impulsivity.    Review of Systems   Neurological: Negative.   Psychiatric/Behavioral: Positive for substance abuse. The patient is nervous/anxious and has insomnia.   All other systems reviewed and are negative.   Blood pressure (!) 100/57, pulse 60, temperature 97.8 F (36.6 C), temperature source Oral, resp. rate 18, height 5\' 2"  (1.575 m), weight 48.5 kg (107 lb).Body mass index is 19.57 kg/m.  General Appearance: Disheveled  Eye Contact:  Good  Speech:  Pressured  Volume:  Normal  Mood:  Dysphoric and Irritable  Affect:  Congruent  Thought Process:  Goal Directed and Descriptions of Associations: Tangential  Orientation:  Full (Time, Place, and Person)  Thought Content:  Delusions and Paranoid Ideation  Suicidal Thoughts:  No  Homicidal Thoughts:  No  Memory:  Immediate;   Fair Recent;   Fair Remote;   Fair  Judgement:  Poor  Insight:  Lacking  Psychomotor Activity:  Increased  Concentration:  Concentration: Fair and Attention Span: Fair  Recall:  [employer] of Knowledge:  Fair  Language:  Fair  Akathisia:  No  Handed:  Left  AIMS (if indicated):     Assets:  Communication Skills Desire for Improvement Financial Resources/Insurance Housing Physical Health Resilience Social Support  ADL's:  Intact  Cognition:  WNL  Sleep:  Number of Hours: 7      COGNITIVE FEATURES THAT CONTRIBUTE TO RISK:  None    SUICIDE RISK:   Moderate:  Frequent suicidal ideation with limited intensity, and duration, some specificity in terms of plans, no associated intent, good self-control, limited dysphoria/symptomatology, some risk factors present, and identifiable protective factors, including available and accessible social support.  PLAN OF CARE: Hospital admission, medication management, substance abuse counseling, discharge planning.  Ms. Gallina is a 41 year old  female with a history of bipolar admitted for an manic psychotic episode in the context of treatment noncompliance and substance abuse.  She is paranoid,  insomniac, and restless. She was recently hospitalized here for extended period of time. She relapsed on alcohol.  # Mood and psychosis -restart Abilify 30 mg daily -restart Zyprexa 30 mg nightly -restart Lithium 600 mg BID -restart Depakote 500 mg TID  # Insomnia -restart Seroquel 200 mg nightly -restart Trazodone as needed  # Alcohol abuse - start Librium  # Metabolic syndrome monitoring - lipid panel, TSH and HgbA1C were recently done - EKG is pending -pregnancy test is negative  # glaucoma -continue Xalantan   # Smoking - nicotine patch is available  # Social - we will contact her mother about guardianship  - patient refuses to apply for disability  # Disposition - she will likely return to her boarding house - follow up with [facility]   I certify that inpatient services furnished can reasonably be expected to improve the patient's condition.   Orson Slick, MD 09/02/2017, 10:54 AM

## 2017-09-03 MED ORDER — TRAZODONE HCL 100 MG PO TABS
100.0000 mg | ORAL_TABLET | Freq: Every day | ORAL | Status: DC
  Administered 2017-09-03 – 2017-09-12 (×10): 100 mg via ORAL
  Filled 2017-09-03 (×10): qty 1

## 2017-09-03 NOTE — Progress Notes (Signed)
Patient remains anxious on approach and requires some redirection to stay on task. Patient is somewhat disorganized but she was compliant with medications .  Speech is rapid and she has no insight into her problems.  Support offered. Safety maintained. She appears to be in bed resting eyes closed.

## 2017-09-03 NOTE — BHH Group Notes (Signed)
[place] Group Notes:  (Nursing/MHT/Case Management/Adjunct)  Date:  09/03/2017  Time:  9:36 PM  Type of Therapy:  Wrap up Grp  Participation Level:  Active  Participation Quality:  Appropriate  Affect:  Appropriate  Cognitive:  Appropriate  Insight:  Good  Engagement in Group:  Engaged  Modes of Intervention:  Socialization  Summary of Progress/Problems:  Autumn [name] 09/03/2017, 9:36 PM

## 2017-09-03 NOTE — Progress Notes (Signed)
Affect flat.  Disorganized thought processes.  Mild confusion noted at times.  Requires redirection at times.  Support offered.  Safety maintained.

## 2017-09-03 NOTE — BHH Group Notes (Signed)
LCSW Group Therapy Note  09/03/2017 1:00pm  Type of Therapy/Topic:  Group Therapy:  Balance in Life  Participation Level:  Active   Description of Group:   This group will address the concept of balance and how it feels and looks when one is unbalanced. Patients will be encouraged to process areas in their lives that are out of balance and identify reasons for remaining unbalanced. Facilitators will guide patients in utilizing problem-solving interventions to address and correct the stressor making their life unbalanced. Understanding and applying boundaries will be explored and addressed for obtaining and maintaining a balanced life. Patients will be encouraged to explore ways to assertively make their unbalanced needs known to significant others in their lives, using other group members and facilitator for support and feedback.  Therapeutic Goals: 1. Patient will identify two or more emotions or situations they have that consume much of in their lives. 2. Patient will identify signs/triggers that life has become out of balance:  3. Patient will identify two ways to set boundaries in order to achieve balance in their lives:  4. Patient will demonstrate ability to communicate their needs through discussion and/or role plays  Summary of Patient Progress:  Pt attended group and stayed the entire time. She states her boss committed her because "I was in physical pain." She states "I gave them my resignation and they told me I couldn't quit. I think that's wrong and he committed me."     Therapeutic Modalities:   Cognitive Behavioral Therapy Solution-Focused Therapy Assertiveness Training  Wray Kearns, LCSW 09/03/2017 2:14 PM

## 2017-09-03 NOTE — BHH Group Notes (Signed)
[place] Group Notes:  (Nursing/MHT/Case Management/Adjunct)  Date:  09/03/2017  Time:  4:36 AM  Type of Therapy:  Psychoeducational Skills  Participation Level:  Did Not Attend   Summary of Progress/Problems:  [name] 09/03/2017, 4:36 AM

## 2017-09-03 NOTE — Progress Notes (Signed)
Patient complain that her tooth hurts when shew something states pain scale at 4,  medicated with  Motrin 600 mg p.o tolerated well noted.

## 2017-09-03 NOTE — Progress Notes (Signed)
[facility] MD Progress Note  09/03/2017 3:40 PM Mattia Osterman  MRN:  [id number] Subjective:   Ms. Lenig is a 41 year old female with a history of schizoaffective disorder bipolar type admitted floridly psychotic, very paranoid.  Pt reports that voices are getting better, pt anxious, endorsing depression, denies SI. slept 5 hrs, did not receive prn trazodone, will make it standing.    Principal Problem: Bipolar I disorder, most recent episode manic, severe with psychotic features ([place]) Diagnosis:   Patient Active Problem List   Diagnosis Date Noted  . Bipolar I disorder, most recent episode manic, severe with psychotic features ([place]) [F31.2] 07/05/2017  . Tobacco use disorder [F17.200] 06/27/2017  . 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 Opioid use disorder, severe, dependence ([place]) [F11.20] 06/27/2017  . Cannabis use disorder, severe, dependence ([place]) [F12.20] 06/27/2017  . Hx of glaucoma [Z86.69] 10/02/2013   Total Time spent with patient: 30 minutes  Past Psychiatric History: Patient has been diagnosed with schizoaffective disorder, bipolar disorder, borderline personality disorder, depression and generalized anxiety disorder. She is being hospitalized here in our unit a multitude of times. She also has been [facility] at least twice. Patient says she has attempted to walk in front of cars in the past. She does report history of self injury by cutting but has not engaged in this in years  She was just discharged from our unit on August 24. She returned to the ER manic again on August 27.  Past Medical History:  Past Medical History:  Diagnosis Date  . Allergy    Seasonal, Ultram (seizures)  . Anemia 2005  . Bipolar 1 disorder ([place])   . Glaucoma   . Seizures ([place]) 2008    Past Surgical History:  Procedure Laterality Date  . BREAST BIOPSY Right    x 2  . CESAREAN SECTION     x 2   Family History:  Family History  Problem Relation Age  of Onset  . Hypertension Mother   . Hyperlipidemia Mother   . Parkinson's disease Father    Family Psychiatric  History: denies  Social History: She lives with her boyfriend of 17 years. She is employed in [employer], educational, prepares part for shipment. She enjoys her job very much. She has benefits there. FMLA form was completed. She has supportive mother.  History  Alcohol Use  . 2.4 oz/week  . 4 Cans of beer per week    Comment: Ocassional     History  Drug Use  . Types: Marijuana    Comment: percocet -last use over 1 week ago    Social History   Social History  . Marital status: Single    Spouse name: N/A  . Number of children: N/A  . Years of education: N/A   Social History Main Topics  . Smoking status: Current Every Day Smoker    Packs/day: 1.00    Years: 9.00  . Smokeless tobacco: Never Used     Comment: Patient not interested  . Alcohol use 2.4 oz/week    4 Cans of beer per week     Comment: Ocassional  . Drug use: Yes    Types: Marijuana     Comment: percocet -last use over 1 week ago  . Sexual activity: No   Other Topics Concern  . None   Social History Narrative  . None     Current Medications: Current Facility-Administered Medications  Medication Dose Route Frequency Provider Last Rate Last Dose  .  acetaminophen (TYLENOL) tablet 650 mg  650 mg Oral Q6H PRN Clapacs, [name], MD   650 mg at 09/02/17 7616  . alum & mag hydroxide-simeth (MAALOX/MYLANTA) 200-200-20 MG/5ML suspension 30 mL  30 mL Oral Q4H PRN Clapacs, [name], MD      . ARIPiprazole (ABILIFY) tablet 30 mg  30 mg Oral Daily Clapacs, [name], MD   30 mg at 09/03/17 0831  . chlordiazePOXIDE (LIBRIUM) capsule 25 mg  25 mg Oral QID [name], [name], MD   25 mg at 09/03/17 1303  . divalproex (DEPAKOTE) DR tablet 500 mg  500 mg Oral Q8H [name], [name], MD   500 mg at 09/03/17 1303  . ibuprofen (ADVIL,MOTRIN) tablet 600 mg  600 mg Oral Q6H PRN Clapacs, [name], MD   600 mg at 09/03/17 0831   . latanoprost (XALATAN) 0.005 % ophthalmic solution 1 drop  1 drop Both Eyes QHS Clapacs, [name], MD   1 drop at 09/02/17 2145  . lithium carbonate capsule 600 mg  600 mg Oral BID WC Clapacs, [name], MD   600 mg at 09/03/17 0831  . magnesium hydroxide (MILK OF MAGNESIA) suspension 30 mL  30 mL Oral Daily PRN Clapacs, [name], MD      . nicotine (NICODERM CQ - dosed in mg/24 hours) patch 21 mg  21 mg Transdermal Daily Clapacs, [name], MD   21 mg at 09/03/17 0831  . OLANZapine (ZYPREXA) tablet 30 mg  30 mg Oral QHS Clapacs, [name], MD   30 mg at 09/02/17 2142  . QUEtiapine (SEROQUEL) tablet 200 mg  200 mg Oral QHS PRN Clapacs, [name], MD      . traZODone (DESYREL) tablet 100 mg  100 mg Oral QHS [name], MD        Lab Results:  Results for orders placed or performed during the hospital encounter of 09/01/17 (from the past 48 hour(s))  Hemoglobin A1c     Status: None   Collection Time: 09/02/17  6:42 AM  Result Value Ref Range   Hgb A1c MFr Bld 4.8 4.8 - 5.6 %    Comment: (NOTE) Pre diabetes:          5.7%-6.4% Diabetes:              >6.4% Glycemic control for   <7.0% adults with diabetes    Mean Plasma Glucose 91.06 mg/dL    Comment: Performed at [facility], [place] [address] [place], [place] [address]  Lipid panel     Status: None   Collection Time: 09/02/17  6:42 AM  Result Value Ref Range   Cholesterol 165 0 - 200 mg/dL   Triglycerides 97 <150 mg/dL   HDL 72 >40 mg/dL   Total CHOL/HDL Ratio 2.3 RATIO   VLDL 19 0 - 40 mg/dL   LDL Cholesterol 74 0 - 99 mg/dL    Comment:        Total Cholesterol/HDL:CHD Risk Coronary Heart Disease Risk Table                     Men   Women  1/2 Average Risk   3.4   3.3  Average Risk       5.0   4.4  2 X Average Risk   9.6   7.1  3 X Average Risk  23.4   11.0        Use the calculated Patient Ratio above and the  CHD Risk Table to determine the patient's CHD Risk.        ATP III CLASSIFICATION (LDL):  <100     mg/dL    Optimal  100-129  mg/dL   Near or Above                    Optimal  130-159  mg/dL   Borderline  160-189  mg/dL   High  >190     mg/dL   Very High   TSH     Status: None   Collection Time: 09/02/17  6:42 AM  Result Value Ref Range   TSH 2.451 0.350 - 4.500 uIU/mL    Comment: Performed by a 3rd Generation assay with a functional sensitivity of <=0.01 uIU/mL.    Blood Alcohol level:  Lab Results  Component Value Date   ETH <10 08/31/2017   ETH <5 11/91/4782    Metabolic Disorder Labs: Lab Results  Component Value Date   HGBA1C 4.8 09/02/2017   MPG 91.06 09/02/2017   MPG 96.8 06/27/2017   No results found for: PROLACTIN Lab Results  Component Value Date   CHOL 165 09/02/2017   TRIG 97 09/02/2017   HDL 72 09/02/2017   CHOLHDL 2.3 09/02/2017   VLDL 19 09/02/2017   LDLCALC 74 09/02/2017   LDLCALC 79 06/27/2017    Physical Findings: AIMS: Facial and Oral Movements Muscles of Facial Expression: None, normal Lips and Perioral Area: None, normal Jaw: None, normal Tongue: None, normal,Extremity Movements Upper (arms, wrists, hands, fingers): None, normal Lower (legs, knees, ankles, toes): None, normal, Trunk Movements Neck, shoulders, hips: None, normal, Overall Severity Severity of abnormal movements (highest score from questions above): None, normal Incapacitation due to abnormal movements: None, normal Patient's awareness of abnormal movements (rate only patient's report): No Awareness, Dental Status Current problems with teeth and/or dentures?: No Does patient usually wear dentures?: No   Musculoskeletal: Strength & Muscle Tone: within normal limits Gait & Station: normal Patient leans: N/A  Psychiatric Specialty Exam: Physical Exam  Nursing note and vitals reviewed. Musculoskeletal: Normal range of motion.  Psychiatric: Her speech is normal and behavior is normal. Her affect is blunt. Thought content is paranoid and delusional. Cognition and memory are  normal. She expresses impulsivity.    Review of Systems  Constitutional: Negative.   HENT: Negative.   Eyes: Negative.   Respiratory: Negative.   Cardiovascular: Negative.   Gastrointestinal: Negative.   Genitourinary: Negative.   Musculoskeletal: Negative.   Skin: Negative.   Neurological: Negative.   Endo/Heme/Allergies: Negative.   Psychiatric/Behavioral:       Paranoia, though disorganization.    Blood pressure (!) 90/59, pulse 72, temperature 97.8 F (36.6 C), temperature source Oral, resp. rate 18, height 5\' 2"  (1.575 m), weight 48.5 kg (107 lb).Body mass index is 19.57 kg/m.  General Appearance: Disheveled  Eye Contact:  Fair  Speech:  Slow  Volume:  Decreased  Mood:  Dysphoric  Affect:  Flat  Thought Process:  Linear and Descriptions of Associations: Loose  Orientation:  Full (Time, Place, and Person)  Thought Content:  Hallucinations: None  Suicidal Thoughts:  No  Homicidal Thoughts:  No  Memory:  Immediate;   Fair Recent;   Fair Remote;   Fair  Judgement:  Fair  Insight:  Fair  Psychomotor Activity:  Decreased  Concentration:  Concentration: Poor and Attention Span: Poor  Recall:  Poor  Fund of Knowledge:  Fair  Language:  Fair  Akathisia:  No  Handed:    AIMS (if indicated):     Assets:  Communication Skills Social Support  ADL's:  Intact  Cognition:  WNL  Sleep:  Number of Hours: 5     Treatment Plan Summary: Daily contact with patient to assess and evaluate symptoms and progress in treatment and Medication management   Ms. Moret is a 41 year old female with a history of bipolar disorder admitted for a psychotic break.   1. Bipolar disorder. She is on Depakote, Zyprexa, Abilify and lithium for psychosis and mood stabilization. She is allergic to Haldol. LI 0.55, VPA 97 on 07/28/2017.   2. EPS. Continue benztropine 0.5 mg q day. There is a history of dystonia with abilify.  3. Insomnia. Trazodone.  Reportedly slept better with Seroquel, but  this is her third antipsychotic.  4. Amphetamines, opiates and marijuana use . The patient will be referred to intensive outpatient substance abuse upon discharge.  The patient was advised to abstain from alcohol andall illicit drugs as they may worsen mood symptoms.  5. Tobacco use disorder. Nicotine patch is available.   6. Metabolic syndrome monitoring. Lipid panel, TSH and HgbA1C were completed on 06/27/2017.  7. EKG. Sinus tachycardia with PVCs, QTc 466.   8. Pregnancy test is negative.  9. UTI. We gave fosfomycin.   10. Disposition. She will be discharged to home with her boyfriend. She will follow up with [facility].    [name], MD Patient ID: [name], female   DOB: [birth year], 41 y.o.   MRN: [id number]

## 2017-09-04 MED ORDER — HYDROXYZINE HCL 50 MG PO TABS
50.0000 mg | ORAL_TABLET | Freq: Four times a day (QID) | ORAL | Status: DC | PRN
  Administered 2017-09-05: 50 mg via ORAL
  Filled 2017-09-04: qty 1

## 2017-09-04 NOTE — [facility] Group Notes (Signed)
LCSW Group Therapy Note  09/04/2017 1:15pm  Type of Therapy/Topic:  Group Therapy:  Emotion Regulation  Participation Level:  Active    Description of Group:   The purpose of this group is to assist patients in learning to regulate negative emotions and experience positive emotions. Patients will be guided to discuss ways in which they have been vulnerable to their negative emotions. These vulnerabilities will be juxtaposed with experiences of positive emotions or situations, and patients will be challenged to use positive emotions to combat negative ones. Special emphasis will be placed on coping with negative emotions in conflict situations, and patients will process healthy conflict resolution skills.  Therapeutic Goals: 1. Patient will identify two positive emotions or experiences to reflect on in order to balance out negative emotions 2. Patient will label two or more emotions that they find the most difficult to experience 3. Patient will demonstrate positive conflict resolution skills through discussion and/or role plays  Summary of Patient Progress: Patient attended group and stayed the entire time. She identified sadness as an emotion she struggles to control.       Therapeutic Modalities:   Cognitive Behavioral Therapy Feelings Identification Dialectical Behavioral Therapy   Wray Kearns, LCSW 09/04/2017 2:51 PM

## 2017-09-04 NOTE — Progress Notes (Signed)
D: Pt denies SI/HI/AV. Pt is pleasant and cooperative. Stated she was doing better, pt spending intermittent time in her room and out in the dayroom.   A: Pt was offered support and encouragement. Pt was given scheduled medications. Pt was encourage to attend groups. Q 15 minute checks were done for safety.   R:Pt is taking medication. Pt receptive to treatment and safety maintained on unit.

## 2017-09-04 NOTE — Progress Notes (Addendum)
[facility] MD Progress Note  09/04/2017 3:43 PM Autumn [name]  MRN:  [id number] Subjective:   Autumn [name] is a 41 year old female with a history of schizoaffective disorder bipolar type admitted floridly psychotic, very paranoid.  Pt continue to be anxious, reported toothache, reports that voices are getting better, pt anxious, endorsing depression, denies SI. slept better.     Principal Problem: Bipolar I disorder, most recent episode manic, severe with psychotic features ([place]) Diagnosis:   Autumn Active Problem List   Diagnosis Date Noted  . Bipolar I disorder, most recent episode manic, severe with psychotic features ([place]) [F31.2] 07/05/2017  . Tobacco use disorder [F17.200] 06/27/2017  . 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 Opioid use disorder, severe, dependence ([place]) [F11.20] 06/27/2017  . Cannabis use disorder, severe, dependence ([place]) [F12.20] 06/27/2017  . Hx of glaucoma [Z86.69] 10/02/2013   Total Time spent with Autumn: 30 minutes  Past Psychiatric History: Autumn has been diagnosed with schizoaffective disorder, bipolar disorder, borderline personality disorder, depression and generalized anxiety disorder. She is being hospitalized here in our unit a multitude of times. She also has been [facility] at least twice. Autumn says she has attempted to walk in front of cars in the past. She does report history of self injury by cutting but has not engaged in this in years  She was just discharged from our unit on August 24. She returned to the ER manic again on August 27.  Past Medical History:  Past Medical History:  Diagnosis Date  . Allergy    Seasonal, Ultram (seizures)  . Anemia 2005  . Bipolar 1 disorder ([place])   . Glaucoma   . Seizures ([place]) 2008    Past Surgical History:  Procedure Laterality Date  . BREAST BIOPSY Right    x 2  . CESAREAN SECTION     x 2   Family History:  Family History  Problem Relation Age of  Onset  . Hypertension Mother   . Hyperlipidemia Mother   . Parkinson's disease Father    Family Psychiatric  History: denies  Social History: She lives with her boyfriend of 17 years. She is employed in [employer], educational, prepares part for shipment. She enjoys her job very much. She has benefits there. FMLA form was completed. She has supportive mother.  History  Alcohol Use  . 2.4 oz/week  . 4 Cans of beer per week    Comment: Ocassional     History  Drug Use  . Types: Marijuana    Comment: percocet -last use over 1 week ago    Social History   Social History  . Marital status: Single    Spouse name: N/A  . Number of children: N/A  . Years of education: N/A   Social History Main Topics  . Smoking status: Current Every Day Smoker    Packs/day: 1.00    Years: 9.00  . Smokeless tobacco: Never Used     Comment: Autumn not interested  . Alcohol use 2.4 oz/week    4 Cans of beer per week     Comment: Ocassional  . Drug use: Yes    Types: Marijuana     Comment: percocet -last use over 1 week ago  . Sexual activity: No   Other Topics Concern  . None   Social History Narrative  . None     Current Medications: Current Facility-Administered Medications  Medication Dose Route Frequency Provider Last Rate Last Dose  . acetaminophen (TYLENOL) tablet  650 mg  650 mg Oral Q6H PRN Clapacs, [name], MD   650 mg at 09/02/17 [phone]  . alum & mag hydroxide-simeth (MAALOX/MYLANTA) 200-200-20 MG/5ML suspension 30 mL  30 mL Oral Q4H PRN Clapacs, [name], MD      . ARIPiprazole (ABILIFY) tablet 30 mg  30 mg Oral Daily Clapacs, [name], MD   30 mg at 09/04/17 0829  . chlordiazePOXIDE (LIBRIUM) capsule 25 mg  25 mg Oral QID [name], [name], MD   25 mg at 09/04/17 1241  . divalproex (DEPAKOTE) DR tablet 500 mg  500 mg Oral Q8H [name], [name], MD   500 mg at 09/04/17 1241  . ibuprofen (ADVIL,MOTRIN) tablet 600 mg  600 mg Oral Q6H PRN Clapacs, [name], MD   600 mg at 09/03/17 2310  .  latanoprost (XALATAN) 0.005 % ophthalmic solution 1 drop  1 drop Both Eyes QHS Clapacs, [name], MD   1 drop at 09/03/17 2309  . lithium carbonate capsule 600 mg  600 mg Oral BID WC Clapacs, [name], MD   600 mg at 09/04/17 0829  . magnesium hydroxide (MILK OF MAGNESIA) suspension 30 mL  30 mL Oral Daily PRN Clapacs, [name], MD      . nicotine (NICODERM CQ - dosed in mg/24 hours) patch 21 mg  21 mg Transdermal Daily Clapacs, [name], MD   21 mg at 09/04/17 0829  . OLANZapine (ZYPREXA) tablet 30 mg  30 mg Oral QHS Clapacs, [name], MD   30 mg at 09/03/17 2112  . QUEtiapine (SEROQUEL) tablet 200 mg  200 mg Oral QHS PRN Clapacs, [name], MD      . traZODone (DESYREL) tablet 100 mg  100 mg Oral QHS [name], MD   100 mg at 09/03/17 2112    Lab Results:  No results found for this or any previous visit (from the past 48 hour(s)).  Blood Alcohol level:  Lab Results  Component Value Date   ETH <10 08/31/2017   ETH <5 96/02/5408    Metabolic Disorder Labs: Lab Results  Component Value Date   HGBA1C 4.8 09/02/2017   MPG 91.06 09/02/2017   MPG 96.8 06/27/2017   No results found for: PROLACTIN Lab Results  Component Value Date   CHOL 165 09/02/2017   TRIG 97 09/02/2017   HDL 72 09/02/2017   CHOLHDL 2.3 09/02/2017   VLDL 19 09/02/2017   LDLCALC 74 09/02/2017   LDLCALC 79 06/27/2017    Physical Findings: AIMS: Facial and Oral Movements Muscles of Facial Expression: None, normal Lips and Perioral Area: None, normal Jaw: None, normal Tongue: None, normal,Extremity Movements Upper (arms, wrists, hands, fingers): None, normal Lower (legs, knees, ankles, toes): None, normal, Trunk Movements Neck, shoulders, hips: None, normal, Overall Severity Severity of abnormal movements (highest score from questions above): None, normal Incapacitation due to abnormal movements: None, normal Autumn's awareness of abnormal movements (rate only Autumn's report): No Awareness, Dental  Status Current problems with teeth and/or dentures?: No Does Autumn usually wear dentures?: No   Musculoskeletal: Strength & Muscle Tone: within normal limits Gait & Station: normal Autumn leans: N/A  Psychiatric Specialty Exam: Physical Exam  Nursing note and vitals reviewed. Musculoskeletal: Normal range of motion.  Psychiatric: Her speech is normal and behavior is normal. Her affect is blunt. Thought content is paranoid and delusional. Cognition and memory are normal. She expresses impulsivity.    Review of Systems  Constitutional: Negative.   HENT: Negative.  Eyes: Negative.   Respiratory: Negative.   Cardiovascular: Negative.   Gastrointestinal: Negative.   Genitourinary: Negative.   Musculoskeletal: Negative.   Skin: Negative.   Neurological: Negative.   Endo/Heme/Allergies: Negative.   Psychiatric/Behavioral:       Paranoia, though disorganization.    Blood pressure 98/61, pulse 75, temperature 97.8 F (36.6 C), temperature source Oral, resp. rate 18, height 5\' 2"  (1.575 m), weight 48.5 kg (107 lb).Body mass index is 19.57 kg/m.  General Appearance: Disheveled  Eye Contact:  Fair  Speech:  Slow  Volume:  Decreased  Mood:  Dysphoric  Affect:  Flat  Thought Process:  Linear and Descriptions of Associations: Loose  Orientation:  Full (Time, Place, and Person)  Thought Content:  Hallucinations: None  Suicidal Thoughts:  No  Homicidal Thoughts:  No  Memory:  Immediate;   Fair Recent;   Fair Remote;   Fair  Judgement:  Fair  Insight:  Fair  Psychomotor Activity:  Decreased  Concentration:  Concentration: Poor and Attention Span: Poor  Recall:  Poor  Fund of Knowledge:  Fair  Language:  Fair  Akathisia:  No  Handed:    AIMS (if indicated):     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  Add vistaril for anxiety. Tylenol and ibuprofen for pain.  1. Bipolar disorder. She is on Depakote, Zyprexa, Abilify and lithium for psychosis and mood stabilization. She is allergic to Haldol. LI 0.55, VPA 97 on 07/28/2017.   2. EPS. Continue benztropine 0.5 mg q day. There is a history of dystonia with abilify.  3. Insomnia. Trazodone.  Reportedly slept better with Seroquel, but this is her third antipsychotic.  4. Amphetamines, opiates and marijuana use . The Autumn will be referred to intensive outpatient substance abuse upon discharge.  The Autumn was advised to abstain from alcohol andall illicit drugs as they may worsen mood symptoms.  5. Tobacco use disorder. Nicotine patch is available.   6. Metabolic syndrome monitoring. Lipid panel, TSH and HgbA1C were completed on 06/27/2017.  7. EKG. Sinus tachycardia with PVCs, QTc 466.   8. Pregnancy test is negative.  9. UTI. We gave fosfomycin.   10. Disposition. She will be discharged to home with her boyfriend. She will follow up with [facility].    [name], MD Autumn [name], female   DOB: 11-21-1975, 41 y.o.   MRN: [id number] Autumn [name], female   DOB: May 18, 1976, 41 y.o.   MRN: [id number]

## 2017-09-04 NOTE — Plan of Care (Signed)
Problem: Education: Goal: Will be free of psychotic symptoms Outcome: Progressing No psychotic episodes noted

## 2017-09-04 NOTE — Plan of Care (Signed)
Problem: Safety: Goal: Ability to remain free from injury will improve Outcome: Progressing Pt safe on the unit at this time   

## 2017-09-04 NOTE — Progress Notes (Signed)
No complaints of toothache. Medication compliant. Reports here due to panic attack. Patient concerned about medications and loosing job. Interacts appropriately with staff and peers. Denies SI, HI, AVH. Encouragement and support offered. Safety checks maintained. Meds given as prescribed. Pt receptive and remains safe on unit with q 15 min checks.

## 2017-09-05 NOTE — B[facility] Group Notes (Signed)
[place] [facility] Group Notes:  (Nursing/MHT/Case Management/Adjunct)  Date:  09/05/2017  Time:  7:53 PM  Type of Therapy:  Psychoeducational Skills  Participation Level:  Active  Participation Quality:  Appropriate and Attentive  Affect:  Appropriate  Cognitive:  Appropriate and Confused  Insight:  Appropriate and Good  Engagement in Group:  Distracting, Limited and Off Topic  Modes of Intervention:  Discussion, Education and Socialization  Summary of Progress/Problems:  Autumn [name] 09/05/2017, 7:53 PM

## 2017-09-05 NOTE — Progress Notes (Signed)
Recreation Therapy Notes  Date: 10.29.18  Time: 9:30 AM  Location: Craft Room  Behavioral response: N/A  Intervention Topic: Coping skills  Discussion/Intervention: Did not attend group. Clinical Observations/Feedback:  Did not attend group.   Autumn [name] LRT/CTRS       Autumn [name] 09/05/2017 4:45 PM

## 2017-09-05 NOTE — Progress Notes (Signed)
[facility] MD Progress Note  09/05/2017 2:53 PM Autumn [name]  MRN:  [id number]  Subjective:    Autumn [name] who looks a little better today but she is still in her room alone looking rather anxious.  She is still hallucinating and is disorganized in her thinking.  She told the social worker that she worries about losing her.  She said that it is taken care of by human resources.  She spoke with her mother and may not stay with discharge.  She is still not talking to her.  She did not complain of tooth today.  Treatment plan.  She was restarted on all medication Abilify, Zyprexa, lithium, Depakote for psychosis and mood stabilization.  Consistently refuses injectable antipsychotic but we will continue to negotiate.  Social/disposition.  The patient with her boyfriend of 17 years when psychotic.  They usually make up before discharge.  The patient was asking today about group home.  She may not be placed as there is no source.  Principal Problem: Bipolar I disorder, most recent episode manic, severe with psychotic features (Autumn [name]) Diagnosis:   Patient Active Problem List   Diagnosis Date Noted  . Bipolar I disorder, most recent episode manic, severe with psychotic features (Autumn [name]) [F31.2] 07/05/2017  . Tobacco use disorder [F17.200] 06/27/2017  . 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 Opioid use disorder, severe, dependence ([place]) [F11.20] 06/27/2017  . Cannabis use disorder, severe, dependence ([place]) [F12.20] 06/27/2017  . Hx of glaucoma [Z86.69] 10/02/2013   Total Time spent with patient: 20 minutes  Past Psychiatric History: Long history of schizoaffective disorder resistant to treatment and medication noncompliance.  Past Medical History:  Past Medical History:  Diagnosis Date  . Allergy    Seasonal, Ultram (seizures)  . Anemia 2005  . Bipolar 1 disorder (Autumn [name])   . Glaucoma   . Seizures ([place]) 2008    Past Surgical History:  Procedure Laterality Date  . BREAST  BIOPSY Right    x 2  . CESAREAN SECTION     x 2   Family History:  Family History  Problem Relation Age of Onset  . Hypertension Mother   . Hyperlipidemia Mother   . Parkinson's disease Father    Family Psychiatric  History: None reported. Social History:  History  Alcohol Use  . 2.4 oz/week  . 4 Cans of beer per week    Comment: Ocassional     History  Drug Use  . Types: Marijuana    Comment: percocet -last use over 1 week ago    Social History   Social History  . Marital status: Single    Spouse name: N/A  . Number of children: N/A  . Years of education: N/A   Social History Main Topics  . Smoking status: Current Every Day Smoker    Packs/day: 1.00    Years: 9.00  . Smokeless tobacco: Never Used     Comment: Patient not interested  . Alcohol use 2.4 oz/week    4 Cans of beer per week     Comment: Ocassional  . Drug use: Yes    Types: Marijuana     Comment: percocet -last use over 1 week ago  . Sexual activity: No   Other Topics Concern  . None   Social History Narrative  . None   Additional Social History:  Sleep: Fair  Appetite:  Fair  Current Medications: Current Facility-Administered Medications  Medication Dose Route Frequency Provider Last Rate Last Dose  . acetaminophen (TYLENOL) tablet 650 mg  650 mg Oral Q6H PRN Clapacs, [name], MD   650 mg at 09/02/17 4782  . alum & mag hydroxide-simeth (MAALOX/MYLANTA) 200-200-20 MG/5ML suspension 30 mL  30 mL Oral Q4H PRN Clapacs, [name], MD      . ARIPiprazole (ABILIFY) tablet 30 mg  30 mg Oral Daily Clapacs, [name], MD   30 mg at 09/05/17 0756  . divalproex (DEPAKOTE) DR tablet 500 mg  500 mg Oral Q8H [name], MD   500 mg at 09/05/17 9562  . hydrOXYzine (ATARAX/VISTARIL) tablet 50 mg  50 mg Oral Q6H PRN [name], MD      . ibuprofen (ADVIL,MOTRIN) tablet 600 mg  600 mg Oral Q6H PRN Clapacs, [name], MD   600 mg at 09/04/17 2109  . latanoprost  (XALATAN) 0.005 % ophthalmic solution 1 drop  1 drop Both Eyes QHS Clapacs, [name], MD   1 drop at 09/04/17 2108  . lithium carbonate capsule 600 mg  600 mg Oral BID WC Clapacs, [name], MD   600 mg at 09/05/17 0756  . magnesium hydroxide (MILK OF MAGNESIA) suspension 30 mL  30 mL Oral Daily PRN Clapacs, [name], MD      . nicotine (NICODERM CQ - dosed in mg/24 hours) patch 21 mg  21 mg Transdermal Daily Clapacs, [name], MD   21 mg at 09/05/17 0755  . OLANZapine (ZYPREXA) tablet 30 mg  30 mg Oral QHS Clapacs, [name], MD   30 mg at 09/04/17 2109  . QUEtiapine (SEROQUEL) tablet 200 mg  200 mg Oral QHS PRN Clapacs, [name], MD      . traZODone (DESYREL) tablet 100 mg  100 mg Oral QHS [name], MD   100 mg at 09/04/17 2109    Lab Results: No results found for this or any previous visit (from the past 81 hour(s)).  Blood Alcohol level:  Lab Results  Component Value Date   ETH <10 08/31/2017   ETH <5 13/06/6577    Metabolic Disorder Labs: Lab Results  Component Value Date   HGBA1C 4.8 09/02/2017   MPG 91.06 09/02/2017   MPG 96.8 06/27/2017   No results found for: PROLACTIN Lab Results  Component Value Date   CHOL 165 09/02/2017   TRIG 97 09/02/2017   HDL 72 09/02/2017   CHOLHDL 2.3 09/02/2017   VLDL 19 09/02/2017   LDLCALC 74 09/02/2017   LDLCALC 79 06/27/2017    Physical Findings: AIMS: Facial and Oral Movements Muscles of Facial Expression: None, normal Lips and Perioral Area: None, normal Jaw: None, normal Tongue: None, normal,Extremity Movements Upper (arms, wrists, hands, fingers): None, normal Lower (legs, knees, ankles, toes): None, normal, Trunk Movements Neck, shoulders, hips: None, normal, Overall Severity Severity of abnormal movements (highest score from questions above): None, normal Incapacitation due to abnormal movements: None, normal Patient's awareness of abnormal movements (rate only patient's report): No Awareness, Dental Status Current problems  with teeth and/or dentures?: No Does patient usually wear dentures?: No  CIWA:    COWS:     Musculoskeletal: Strength & Muscle Tone: within normal limits Gait & Station: normal Patient leans: N/A  Psychiatric Specialty Exam: Physical Exam  Nursing note and vitals reviewed. Psychiatric: Her speech is normal. Her mood appears anxious. She is withdrawn and actively hallucinating. Thought content  is paranoid and delusional. Cognition and memory are normal. She expresses impulsivity.    Review of Systems  Neurological: Negative.   Psychiatric/Behavioral: Positive for hallucinations. The patient is nervous/anxious.   All other systems reviewed and are negative.   Blood pressure (!) 91/59, pulse 78, temperature 97.8 F (36.6 C), temperature source Oral, resp. rate 12, height 5\' 2"  (1.575 m), weight 48.5 kg (107 lb), SpO2 99 %.Body mass index is 19.57 kg/m.  General Appearance: Casual  Eye Contact:  Good  Speech:  Clear and Coherent  Volume:  Normal  Mood:  Anxious  Affect:  Congruent  Thought Process:  Disorganized and Descriptions of Associations: Tangential  Orientation:  Full (Time, Place, and Person)  Thought Content:  Delusions, Hallucinations: Auditory and Paranoid Ideation  Suicidal Thoughts:  No  Homicidal Thoughts:  No  Memory:  Immediate;   Fair Recent;   Fair Remote;   Fair  Judgement:  Poor  Insight:  Lacking  Psychomotor Activity:  Normal  Concentration:  Concentration: Fair and Attention Span: Fair  Recall:  [employer] of Knowledge:  Fair  Language:  Fair  Akathisia:  No  Handed:  Right  AIMS (if indicated):     Assets:  Communication Skills Desire for Improvement Financial Resources/Insurance Housing Physical Health Resilience Social Support Vocational/Educational  ADL's:  Intact  Cognition:  WNL  Sleep:  Number of Hours: 6.15     Treatment Plan Summary: Daily contact with patient to assess and evaluate symptoms and progress in treatment and  Medication management   [name] is a 41 year old female with a history of bipolar disorder admitted for a psychotic break. She is still paranoid, delusional and hallucinating.    # Mood and psychosis - Depakote 500 mg TID., level 97 - Lithium 600 mg BID, level 0.55 - Zyprexa 30 mg nightly - Abilify 30 mg nightly  # Insomnia - Seroquel 200 mg nightly -Trazodone 10 mg as needed  # Smoking - nicotine patch is available  # Metabolic syndrome monitoring - labs completed on 06/27/2017 - EKG QTc 466  # Anxiety - Vistaril 25 mg TID  # Disposition - patient is homeless - follow up with CBC   Orson Slick, MD 09/05/2017, 2:53 PM

## 2017-09-05 NOTE — Plan of Care (Signed)
Problem: Safety: Goal: Ability to remain free from injury will improve Outcome: Progressing Safety maintained in the unit.  Problem: Coping: Goal: Ability to verbalize frustrations and anger appropriately will improve Outcome: Not Progressing Patient beating on her head and verbalizing her frustrations.

## 2017-09-05 NOTE — Progress Notes (Signed)
Recreation Therapy Notes  Date: 10.29.18  Time: 3:00pm   Location: Craft Room  Behavioral response: Disengaged  Group Type: Art/Craft  Participation level: Minimal  Communication: Patient disengaged from her peers and staff during group.  Comments: Patient left group for unknown reasons and returned towards the end of group.  Aleesha Ringstad LRT/CTRS        Tevis Conger 09/05/2017 4:46 PM

## 2017-09-05 NOTE — [facility] Group Notes (Signed)
LCSW Group Therapy Note   09/05/2017 1:00pm   Type of Therapy and Topic:  Group Therapy:  Overcoming Obstacles   Participation Level:  Active   Description of Group:    In this group patients will be encouraged to explore what they see as obstacles to their own wellness and recovery. They will be guided to discuss their thoughts, feelings, and behaviors related to these obstacles. The group will process together ways to cope with barriers, with attention given to specific choices patients can make. Each patient will be challenged to identify changes they are motivated to make in order to overcome their obstacles. This group will be process-oriented, with patients participating in exploration of their own experiences as well as giving and receiving support and challenge from other group members.   Therapeutic Goals: 1. Patient will identify personal and current obstacles as they relate to admission. 2. Patient will identify barriers that currently interfere with their wellness or overcoming obstacles.  3. Patient will identify feelings, thought process and behaviors related to these barriers. 4. Patient will identify two changes they are willing to make to overcome these obstacles:      Summary of Patient Progress [name] shares the obstacle that she faces right now is housing and feeling safe. She shares several instances where she has not been safe and her desire to be safe and independent. Some inappropriate laughter. Able to meet therapeutic goals listed above.  She says she knows she will need to use coping skills when she gets upset/angry.     Therapeutic Modalities:   Cognitive Behavioral Therapy Solution Focused Therapy Motivational Interviewing Relapse Prevention Therapy  August Saucer, LCSW 09/05/2017 1:43 PM

## 2017-09-05 NOTE — Progress Notes (Signed)
Patient was pacing in the hall way and beating her head states "I am loosing myself,if I am like this I will loose my job".Patient stated that the medicines makes me so sleepy.Encouraged patient to use positive coping skills.Patient verbalized that she is having bad thoughts all the time.Denies SI,HI and AVH.Compliant with medications.Attended groups.Support and encouragement given.

## 2017-09-06 LAB — LITHIUM LEVEL: Lithium Lvl: 0.63 mmol/L (ref 0.60–1.20)

## 2017-09-06 LAB — VALPROIC ACID LEVEL: Valproic Acid Lvl: 78 ug/mL (ref 50.0–100.0)

## 2017-09-06 NOTE — [facility] Group Notes (Signed)
[facility] LCSW Group Therapy Note  Date/Time: 09/06/17, 1400  Type of Therapy/Topic:  Group Therapy:  Feelings about Diagnosis  Participation Level:  Active   Mood:pleasant   Description of Group:    This group will allow patients to explore their thoughts and feelings about diagnoses they have received. Patients will be guided to explore their level of understanding and acceptance of these diagnoses. Facilitator will encourage patients to process their thoughts and feelings about the reactions of others to their diagnosis, and will guide patients in identifying ways to discuss their diagnosis with significant others in their lives. This group will be process-oriented, with patients participating in exploration of their own experiences as well as giving and receiving support and challenge from other group members.   Therapeutic Goals: 1. Patient will demonstrate understanding of diagnosis as evidence by identifying two or more symptoms of the disorder:  2. Patient will be able to express two feelings regarding the diagnosis 3. Patient will demonstrate ability to communicate their needs through discussion and/or role plays  Summary of Patient Progress:Pt identified bipolar disorder and schizoaffective disorder as her diagnoses and was able to name several symptoms.  Pt made several comments unrelated to the group discussion and appears to continue to be somewhat disorganized.        Therapeutic Modalities:   Cognitive Behavioral Therapy Brief Therapy Feelings Identification   Lurline Idol, LCSW
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
## 2017-09-06 NOTE — Progress Notes (Signed)
D:Pt denies SI/HI/AVH. Patient is alert and oriented x 4 , her affect is flat but brightens upon approach, mood is pleasant, and she is cooperative with treatment plan. Patient's thoughts are less disoganized, speech is coherent. Patient appears less anxious, no confusion to place or situation during the shift. Patient is noted interacting with peers and staff appropriately.  A:Pt was offered support and encouragement. Pt was given scheduled medications. Pt was encouraged to attend groups. Q 15 minute checks were done for safety.  R:Patientattends groups and interacts well with peers and staff. Pt is complaint with medication. Patient isreceptive to treatment and safety maintained on unit.

## 2017-09-06 NOTE — [facility] Group Notes (Signed)
LCSW Group Therapy Note 09/06/2017 9:00am  Type of Therapy and Topic:  Group Therapy:  Setting Goals  Participation Level:  Did Not Attend  Description of Group: In this process group, patients discussed using strengths to work toward goals and address challenges.  Patients identified two positive things about themselves and one goal they were working on.  Patients were given the opportunity to share openly and support each other's plan for self-empowerment.  The group discussed the value of gratitude and were encouraged to have a daily reflection of positive characteristics or circumstances.  Patients were encouraged to identify a plan to utilize their strengths to work on current challenges and goals.  Therapeutic Goals 1. Patient will verbalize personal strengths/positive qualities and relate how these can assist with achieving desired personal goals 2. Patients will verbalize affirmation of peers plans for personal change and goal setting 3. Patients will explore the value of gratitude and positive focus as related to successful achievement of goals 4. Patients will verbalize a plan for regular reinforcement of personal positive qualities and circumstances.  Summary of Patient Progress:       Therapeutic Modalities Cognitive Behavioral Therapy Motivational Interviewing    Autumn Saucer, LCSW 09/06/2017 11:41 AM

## 2017-09-06 NOTE — Progress Notes (Signed)
[facility] MD Progress Note  09/06/2017 11:03 AM Autumn [name]  MRN:  [id number]  Subjective:   Ms. Byrd still complains of sedation from Depakote.  This was the reason why she discontinued it.  We will check the lithium level and they are low therapeutic.  She is still disorganized, paranoid and hallucinating.  She is unable to participate in discharge planning.  Treatment plan.  We will continue current regimen of lithium, Depakote for mood stabilization and Abilify and Zyprexa for psychosis.  Social/disposition.  The patient is homeless.  She has no place to go unless she has her relationship with her boyfriend of 17 years.  She apparently still has a job.  The patient refuses to apply for disability.  Principal Problem: Bipolar I disorder, most recent episode manic, severe with psychotic features ([place]) Diagnosis:   Patient Active Problem List   Diagnosis Date Noted  . Bipolar I disorder, most recent episode manic, severe with psychotic features ([place]) [F31.2] 07/05/2017  . Tobacco use disorder [F17.200] 06/27/2017  . 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 Opioid use disorder, severe, dependence ([place]) [F11.20] 06/27/2017  . Cannabis use disorder, severe, dependence ([place]) [F12.20] 06/27/2017  . Hx of glaucoma [Z86.69] 10/02/2013   Total Time spent with patient: 20 minutes  Past Psychiatric History: Long history of schizoaffective bipolar type that is treatment resistant and history of noncompliance.  Past Medical History:  Past Medical History:  Diagnosis Date  . Allergy    Seasonal, Ultram (seizures)  . Anemia 2005  . Bipolar 1 disorder ([place])   . Glaucoma   . Seizures ([place]) 2008    Past Surgical History:  Procedure Laterality Date  . BREAST BIOPSY Right    x 2  . CESAREAN SECTION     x 2   Family History:  Family History  Problem Relation Age of Onset  . Hypertension Mother   . Hyperlipidemia Mother   . Parkinson's disease Father    Family  Psychiatric  History: None reported. Social History:  History  Alcohol Use  . 2.4 oz/week  . 4 Cans of beer per week    Comment: Ocassional     History  Drug Use  . Types: Marijuana    Comment: percocet -last use over 1 week ago    Social History   Social History  . Marital status: Single    Spouse name: N/A  . Number of children: N/A  . Years of education: N/A   Social History Main Topics  . Smoking status: Current Every Day Smoker    Packs/day: 1.00    Years: 9.00  . Smokeless tobacco: Never Used     Comment: Patient not interested  . Alcohol use 2.4 oz/week    4 Cans of beer per week     Comment: Ocassional  . Drug use: Yes    Types: Marijuana     Comment: percocet -last use over 1 week ago  . Sexual activity: No   Other Topics Concern  . None   Social History Narrative  . None   Additional Social History:                         Sleep: Fair  Appetite:  Fair  Current Medications: Current Facility-Administered Medications  Medication Dose Route Frequency Provider Last Rate Last Dose  . acetaminophen (TYLENOL) tablet 650 mg  650 mg Oral Q6H PRN Clapacs, [name], MD   650 mg  at 09/02/17 3329  . alum & mag hydroxide-simeth (MAALOX/MYLANTA) 200-200-20 MG/5ML suspension 30 mL  30 mL Oral Q4H PRN Clapacs, [name], MD      . ARIPiprazole (ABILIFY) tablet 30 mg  30 mg Oral Daily Clapacs, [name], MD   30 mg at 09/06/17 0759  . divalproex (DEPAKOTE) DR tablet 500 mg  500 mg Oral Q8H Meliah Appleman B, MD   500 mg at 09/06/17 0639  . hydrOXYzine (ATARAX/VISTARIL) tablet 50 mg  50 mg Oral Q6H PRN [name], MD   50 mg at 09/05/17 2156  . ibuprofen (ADVIL,MOTRIN) tablet 600 mg  600 mg Oral Q6H PRN Clapacs, [name], MD   600 mg at 09/04/17 2109  . latanoprost (XALATAN) 0.005 % ophthalmic solution 1 drop  1 drop Both Eyes QHS Clapacs, [name], MD   1 drop at 09/05/17 2157  . lithium carbonate capsule 600 mg  600 mg Oral BID WC Clapacs, [name], MD   600  mg at 09/06/17 0759  . magnesium hydroxide (MILK OF MAGNESIA) suspension 30 mL  30 mL Oral Daily PRN Clapacs, [name], MD      . nicotine (NICODERM CQ - dosed in mg/24 hours) patch 21 mg  21 mg Transdermal Daily Clapacs, [name], MD   21 mg at 09/06/17 0759  . OLANZapine (ZYPREXA) tablet 30 mg  30 mg Oral QHS Clapacs, [name], MD   30 mg at 09/05/17 2200  . QUEtiapine (SEROQUEL) tablet 200 mg  200 mg Oral QHS PRN Clapacs, [name], MD   200 mg at 09/05/17 2156  . traZODone (DESYREL) tablet 100 mg  100 mg Oral QHS [name], MD   100 mg at 09/05/17 2156    Lab Results:  Results for orders placed or performed during the hospital encounter of 09/01/17 (from the past 48 hour(s))  Lithium level     Status: None   Collection Time: 09/06/17  6:55 AM  Result Value Ref Range   Lithium Lvl 0.63 0.60 - 1.20 mmol/L  Valproic acid level     Status: None   Collection Time: 09/06/17  6:55 AM  Result Value Ref Range   Valproic Acid Lvl 78 50.0 - 100.0 ug/mL    Blood Alcohol level:  Lab Results  Component Value Date   ETH <10 08/31/2017   ETH <5 51/88/4166    Metabolic Disorder Labs: Lab Results  Component Value Date   HGBA1C 4.8 09/02/2017   MPG 91.06 09/02/2017   MPG 96.8 06/27/2017   No results found for: PROLACTIN Lab Results  Component Value Date   CHOL 165 09/02/2017   TRIG 97 09/02/2017   HDL 72 09/02/2017   CHOLHDL 2.3 09/02/2017   VLDL 19 09/02/2017   LDLCALC 74 09/02/2017   LDLCALC 79 06/27/2017    Physical Findings: AIMS: Facial and Oral Movements Muscles of Facial Expression: None, normal Lips and Perioral Area: None, normal Jaw: None, normal Tongue: None, normal,Extremity Movements Upper (arms, wrists, hands, fingers): None, normal Lower (legs, knees, ankles, toes): None, normal, Trunk Movements Neck, shoulders, hips: None, normal, Overall Severity Severity of abnormal movements (highest score from questions above): None, normal Incapacitation due to abnormal  movements: None, normal Patient's awareness of abnormal movements (rate only patient's report): No Awareness, Dental Status Current problems with teeth and/or dentures?: No Does patient usually wear dentures?: No  CIWA:    COWS:     Musculoskeletal: Strength & Muscle Tone: within normal limits Gait & Station:  normal Patient leans: N/A  Psychiatric Specialty Exam: Physical Exam  Nursing note and vitals reviewed. Psychiatric: Her affect is blunt. Her speech is delayed. She is slowed and actively hallucinating. Thought content is paranoid and delusional. Cognition and memory are impaired. She expresses impulsivity.    Review of Systems  Neurological: Negative.   Psychiatric/Behavioral: Positive for hallucinations.  All other systems reviewed and are negative.   Blood pressure (!) 91/57, pulse 60, temperature 97.7 F (36.5 C), temperature source Oral, resp. rate 18, height 5\' 2"  (1.575 m), weight 48.5 kg (107 lb), SpO2 99 %.Body mass index is 19.57 kg/m.  General Appearance: Fairly Groomed  Eye Contact:  Fair  Speech:  Clear and Coherent  Volume:  Decreased  Mood:  Dysphoric  Affect:  Congruent  Thought Process:  Disorganized and Descriptions of Associations: Tangential  Orientation:  Full (Time, Place, and Person)  Thought Content:  Delusions, Hallucinations: Auditory and Paranoid Ideation  Suicidal Thoughts:  No  Homicidal Thoughts:  No  Memory:  Immediate;   Fair Recent;   Fair Remote;   Fair  Judgement:  Poor  Insight:  Lacking  Psychomotor Activity:  Psychomotor Retardation  Concentration:  Concentration: Fair and Attention Span: Fair  Recall:  [employer] of Knowledge:  Fair  Language:  Fair  Akathisia:  No  Handed:  Right  AIMS (if indicated):     Assets:  Communication Skills Desire for Improvement Financial Resources/Insurance Physical Health Resilience Social Support Vocational/Educational  ADL's:  Intact  Cognition:  WNL  Sleep:  Number of Hours: 6.5      Treatment Plan Summary: Daily contact with patient to assess and evaluate symptoms and progress in treatment and Medication management   Autumn [name] is a 41 year old female with a history of bipolar disorder admitted for a psychotic break. She is still paranoid, delusional and hallucinating.    # Mood and psychosis - Depakote 500 mg TID, level on 10/30 78 - Lithium 600 mg BID, level on 10/30 0.63 - Zyprexa 30 mg nightly - Abilify 30 mg nightly  # Insomnia - Seroquel 200 mg nightly -Trazodone 10 mg as needed  # Smoking - nicotine patch is available  # Metabolic syndrome monitoring - labs completed on 06/27/2017 - EKG QTc 466  # Anxiety - Vistaril 25 mg TID  # Disposition - patient is homeless - follow up with CBC  Orson Slick, MD 09/06/2017, 11:03 AM

## 2017-09-06 NOTE — Progress Notes (Signed)
Recreation Therapy Notes  Date: 10.30.18   Time: 9:30am   Location: Craft Room   Behavioral response: None   Intervention Topic: Team building   Discussion/Intervention: Patient did not attend group.   Clinical Observations/Feedback:  Patient did not attend group.   Ikia Cincotta LRT/CTRS        Jeannett Dekoning 09/06/2017 12:15 PM

## 2017-09-07 MED ORDER — ENSURE ENLIVE PO LIQD
237.0000 mL | Freq: Three times a day (TID) | ORAL | Status: DC
  Administered 2017-09-07 – 2017-09-13 (×16): 237 mL via ORAL

## 2017-09-07 MED ORDER — VENLAFAXINE HCL ER 75 MG PO CP24
150.0000 mg | ORAL_CAPSULE | Freq: Every day | ORAL | Status: DC
  Administered 2017-09-07 – 2017-09-13 (×7): 150 mg via ORAL
  Filled 2017-09-07 (×7): qty 2

## 2017-09-07 NOTE — Tx Team (Signed)
Interdisciplinary Treatment and Diagnostic Plan Update  09/07/2017 Time of Session: 10:30am [name] MRN: [id number]  Principal Diagnosis: Bipolar I disorder, most recent episode manic, severe with psychotic features ([place]umn [name])  Secondary Diagnoses: Principal Problem:   Bipolar I disorder, most recent episode manic, severe with psychotic features (Autumn [name]) Active Problems:   Hx of glaucoma   Tobacco use disorder   Cannabis use disorder, severe, dependence ([name])   Current Medications:  Current Facility-Administered Medications  Medication Dose Route Frequency Provider Last Rate Last Dose  . acetaminophen (TYLENOL) tablet 650 mg  650 mg Oral Q6H PRN Clapacs, [name], MD   650 mg at 09/02/17 3267  . alum & mag hydroxide-simeth (MAALOX/MYLANTA) 200-200-20 MG/5ML suspension 30 mL  30 mL Oral Q4H PRN Clapacs, [name], MD      . ARIPiprazole (ABILIFY) tablet 30 mg  30 mg Oral Daily Clapacs, [name], MD   30 mg at 09/07/17 0813  . divalproex (DEPAKOTE) DR tablet 500 mg  500 mg Oral Q8H [name], [name], MD   500 mg at 09/07/17 0634  . feeding supplement (ENSURE ENLIVE) (ENSURE ENLIVE) liquid 237 mL  237 mL Oral TID BM [name], [name], MD      . hydrOXYzine (ATARAX/VISTARIL) tablet 50 mg  50 mg Oral Q6H PRN [name], MD   50 mg at 09/05/17 2156  . ibuprofen (ADVIL,MOTRIN) tablet 600 mg  600 mg Oral Q6H PRN Clapacs, [name], MD   600 mg at 09/04/17 2109  . latanoprost (XALATAN) 0.005 % ophthalmic solution 1 drop  1 drop Both Eyes QHS Clapacs, [name], MD   1 drop at 09/06/17 2220  . lithium carbonate capsule 600 mg  600 mg Oral BID WC Clapacs, [name], MD   600 mg at 09/07/17 0813  . magnesium hydroxide (MILK OF MAGNESIA) suspension 30 mL  30 mL Oral Daily PRN Clapacs, [name], MD      . nicotine (NICODERM CQ - dosed in mg/24 hours) patch 21 mg  21 mg Transdermal Daily Clapacs, [name], MD   21 mg at 09/07/17 0813  . OLANZapine (ZYPREXA) tablet 30 mg  30 mg Oral QHS Clapacs, [name],  MD   30 mg at 09/06/17 2220  . QUEtiapine (SEROQUEL) tablet 200 mg  200 mg Oral QHS PRN Clapacs, [name], MD   200 mg at 09/06/17 2220  . traZODone (DESYREL) tablet 100 mg  100 mg Oral QHS [name], MD   100 mg at 09/06/17 2220  . venlafaxine XR (EFFEXOR-XR) 24 hr capsule 150 mg  150 mg Oral Q breakfast [name], [name], MD   150 mg at 09/07/17 1143   PTA Medications: Prescriptions Prior to Admission  Medication Sig Dispense Refill Last Dose  . ARIPiprazole (ABILIFY) 30 MG tablet Take 1 tablet (30 mg total) by mouth daily. (Patient not taking: Reported on 09/01/2017) 30 tablet 1 Not Taking at unknown  . carbamazepine (TEGRETOL) 200 MG tablet Take 200 mg by mouth 2 (two) times daily.   unknown at unknown  . citalopram (CELEXA) 20 MG tablet Take 20 mg by mouth daily.   unknown at unknown  . divalproex (DEPAKOTE) 250 MG DR tablet Take 3 tablets (750 mg total) by mouth every 12 (twelve) hours. (Patient not taking: Reported on 09/01/2017) 180 tablet 1 Not Taking at unknown  . latanoprost (XALATAN) 0.005 % ophthalmic solution Place 1 drop into both eyes at bedtime. (Patient not taking: Reported on 09/01/2017) 2.5 mL 12  Not Taking at unknown  . lithium carbonate (LITHOBID) 300 MG CR tablet Take 2 tablets (600 mg total) by mouth every 12 (twelve) hours. (Patient taking differently: Take 300 mg by mouth 2 (two) times daily. ) 120 tablet 1 unknown at unknown  . OLANZapine (ZYPREXA) 15 MG tablet Take 2 tablets (30 mg total) by mouth at bedtime. (Patient not taking: Reported on 09/01/2017) 60 tablet 1 Not Taking at unknown  . QUEtiapine (SEROQUEL) 200 MG tablet Take 1 tablet (200 mg total) by mouth at bedtime as needed (sleep). (Patient not taking: Reported on 09/01/2017) 30 tablet 1 Not Taking at Unknown time  . traZODone (DESYREL) 150 MG tablet Take 150 mg by mouth at bedtime.   unknown at unknown    Patient Stressors: Medication change or noncompliance Occupational concerns Substance  abuse  Patient Strengths: Average or above average intelligence Capable of independent living  Treatment Modalities: Medication Management, Group therapy, Case management,  1 to 1 session with clinician, Psychoeducation, Recreational therapy.   Physician Treatment Plan for Primary Diagnosis: Bipolar I disorder, most recent episode manic, severe with psychotic features ([place]) Long Term Goal(s): Improvement in symptoms so as ready for discharge Improvement in symptoms so as ready for discharge   Short Term Goals: Ability to identify changes in lifestyle to reduce recurrence of condition will improve Ability to verbalize feelings will improve Ability to disclose and discuss suicidal ideas Ability to demonstrate self-control will improve Ability to identify and develop effective coping behaviors will improve Compliance with prescribed medications will improve Ability to identify triggers associated with substance abuse/mental health issues will improve Ability to identify changes in lifestyle to reduce recurrence of condition will improve Ability to demonstrate self-control will improve Ability to identify triggers associated with substance abuse/mental health issues will improve  Medication Management: Evaluate patient's response, side effects, and tolerance of medication regimen.  Therapeutic Interventions: 1 to 1 sessions, Unit Group sessions and Medication administration.  Evaluation of Outcomes:Progressing  Physician Treatment Plan for Secondary Diagnosis: Principal Problem:   Bipolar I disorder, most recent episode manic, severe with psychotic features (Autumn [name]) Active Problems:   Hx of glaucoma   Tobacco use disorder   Cannabis use disorder, severe, dependence (Autumn [name])  Long Term Goal(s): Improvement in symptoms so as ready for discharge Improvement in symptoms so as ready for discharge   Short Term Goals: Ability to identify changes in lifestyle to reduce recurrence of condition  will improve Ability to verbalize feelings will improve Ability to disclose and discuss suicidal ideas Ability to demonstrate self-control will improve Ability to identify and develop effective coping behaviors will improve Compliance with prescribed medications will improve Ability to identify triggers associated with substance abuse/mental health issues will improve Ability to identify changes in lifestyle to reduce recurrence of condition will improve Ability to demonstrate self-control will improve Ability to identify triggers associated with substance abuse/mental health issues will improve     Medication Management: Evaluate patient's response, side effects, and tolerance of medication regimen.  Therapeutic Interventions: 1 to 1 sessions, Unit Group sessions and Medication administration.  Evaluation of Outcomes: Progressing   RN Treatment Plan for Primary Diagnosis: Bipolar I disorder, most recent episode manic, severe with psychotic features ([place]) Long Term Goal(s): Knowledge of disease and therapeutic regimen to maintain health will improve  Short Term Goals: Ability to identify and develop effective coping behaviors will improve and Compliance with prescribed medications will improve  Medication Management: RN will administer medications as ordered by provider, will assess and  evaluate patient's response and provide education to patient for prescribed medication. RN will report any adverse and/or side effects to prescribing provider.  Therapeutic Interventions: 1 on 1 counseling sessions, Psychoeducation, Medication administration, Evaluate responses to treatment, Monitor vital signs and CBGs as ordered, Perform/monitor CIWA, COWS, AIMS and Fall Risk screenings as ordered, Perform wound care treatments as ordered.  Evaluation of Outcomes: Progressing   LCSW Treatment Plan for Primary Diagnosis: Bipolar I disorder, most recent episode manic, severe with psychotic features  ([place]) Long Term Goal(s): Safe transition to appropriate next level of care at discharge, Engage patient in therapeutic group addressing interpersonal concerns.  Short Term Goals: Engage patient in aftercare planning with referrals and resources, Increase social support and Increase skills for wellness and recovery  Therapeutic Interventions: Assess for all discharge needs, 1 to 1 time with Social worker, Explore available resources and support systems, Assess for adequacy in community support network, Educate family and significant other(s) on suicide prevention, Complete Psychosocial Assessment, Interpersonal group therapy.  Evaluation of Outcomes: Progressing   Progress in Treatment: Attending groups: Yes. Participating in groups: Yes. Taking medication as prescribed: Yes. Toleration medication: Yes. Family/Significant other contact made: No, will contact:  when given permission Patient understands diagnosis: Yes. Discussing patient identified problems/goals with staff: Yes. Medical problems stabilized or resolved: Yes. Denies suicidal/homicidal ideation: Yes. Issues/concerns per patient self-inventory: No. Other: none  New problem(s) identified: No, Describe:  none  New Short Term/Long Term Goal(s):Pt goal: to act my age, not act like I'm twently.  Discharge Plan or Barriers: Pt will continue with [facility] for outpt services. Housing TBD  Reason for Continuation of Hospitalization: Medication stabilization Other; describe bizarre behavior, Depression  Estimated Length of Stay: 5-7 days.  Attendees: Patient: 09/07/2017   Physician: Dr. Bary Leriche, MD 09/07/2017   Nursing: Elige Radon, RN 09/07/2017   RN Care Manager:   Social Worker: Lurline Idol, LCSW 09/07/2017   Recreational Therapist: Roanna Epley, LRT 09/07/2017   Other:    Other:    Other:        Scribe for Treatment Team: August Saucer, LCSW 09/07/2017 1:27 PM

## 2017-09-07 NOTE — Progress Notes (Signed)
Recreation Therapy Notes   Date: 10.31.18  Time: 9:30am  Location: Craft Room  Behavioral response: Appropriate  Intervention Topic: Emotions  Discussion/Intervention: Group content on today was focused on emotions. The group identified what emotions are and why it is important to have emotions. Patients expressed some positive and negative emotions. Individuals gave some past experiences on how they normally dealt with emotions in the past. The group described some positive ways to deal with emotions in the future. Patients participated in the intervention "Shaded Emotions" where a story was read to the group as they colored in what feelings they were feeling into a coloring sheet.  Clinical Observations/Feedback:  Patient came to group and was positively engaged with her peers and staff. Individual described fear as a negative emotion during group. She also identified walking away as a positive way she deals with emotions. While in group patient gave contributions to the discussion that was on topic.  Riva Sesma LRT/CTRS    Sylis Ketchum 09/07/2017 12:50 PM

## 2017-09-07 NOTE — Progress Notes (Signed)
Recreation Therapy Notes   Date: 10.31.18  Time: 3:00pm  Location: Outside  Behavioral response: N/A  Group Type: Leisure  Participation level: N/A  Communication: Patient did not attend group.  Comments: N/A  Halley Shepheard LRT/CTRS        Katria Botts 09/07/2017 4:14 PM

## 2017-09-07 NOTE — Progress Notes (Signed)
Patient lays in bed quite a bit today. She is encouraged to shower as she stated to nurse she hasn't showered in 3 days. She states she is just so depressed. New medication started. She denies si, hi, avh. She did have a card in her shirt at lunch time and was asked to remove it. She was compliant with this task. No other behaviors noted. Will continue to monitor.

## 2017-09-07 NOTE — Progress Notes (Signed)
D:Pt denies SI/HI/AVH, she is alert and oriented x 3, with confusion to situation. Patient's   affect is flat but brightens upon approach, mood is pleasant, and she is cooperative with treatment plan. Patient's thoughts are less disoganized, speech is coherent. Patient is noted interacting with peers and staff appropriately.  A:Pt was offered support and encouragement. Pt was given scheduled medications. Pt was encouraged to attend groups. Q 15 minute checks were done for safety.  R:Patientattends group and interacts well with peers and staff. Pt is complaint with medication. Patient isreceptive to treatment and safety maintained on unit.

## 2017-09-07 NOTE — [facility] Group Notes (Signed)
  [facility] LCSW Group Therapy Note  Date/Time: 09/07/17, 1300  Type of Therapy/Topic:  Group Therapy:  Emotion Regulation  Participation Level:  Active   Mood: pleasant  Description of Group:    The purpose of this group is to assist patients in learning to regulate negative emotions and experience positive emotions. Patients will be guided to discuss ways in which they have been vulnerable to their negative emotions. These vulnerabilities will be juxtaposed with experiences of positive emotions or situations, and patients challenged to use positive emotions to combat negative ones. Special emphasis will be placed on coping with negative emotions in conflict situations, and patients will process healthy conflict resolution skills.  Therapeutic Goals: 1. Patient will identify two positive emotions or experiences to reflect on in order to balance out negative emotions:  2. Patient will label two or more emotions that they find the most difficult to experience:  3. Patient will be able to demonstrate positive conflict resolution skills through discussion or role plays:   Summary of Patient Progress: Pt shared that rage and grief are emotions that are difficult to experience.  Pt is attentive in group, but does continue to make somewhat unrelated comments, such as talking about her boss following her around at work.       Therapeutic Modalities:   Cognitive Behavioral Therapy Feelings Identification Dialectical Behavioral Therapy  Lurline Idol, LCSW

## 2017-09-07 NOTE — Progress Notes (Signed)
[facility] MD Progress Note  09/07/2017 12:26 PM Havilah Topor  MRN:  [id number]  Subjective:   Ms. Teater is in bed complaining of depression. She has not showered in 3 days, eats very little and has some suicidal thoughts without a plan. She is asking for antidepressant.  Treatment plan.  We will continue lithium and  Depakote for mood stabilization, and Zyprexa for psychosis.  I will and Effexor 150 mg for depression.  The patient continues to refuse injectable antipsychotic.  She is not interested in taking clozapine.  Social/disposition.  She is homeless.  She hopefully will return to work to keep her insurance.  She adamantly rejects the idea to apply for Social Security disability.  Principal Problem: Bipolar I disorder, most recent episode manic, severe with psychotic features ([place]) Diagnosis:   Patient Active Problem List   Diagnosis Date Noted  . Bipolar I disorder, most recent episode manic, severe with psychotic features ([place]) [F31.2] 07/05/2017  . Tobacco use disorder [F17.200] 06/27/2017  . 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 Opioid use disorder, severe, dependence ([place]) [F11.20] 06/27/2017  . Cannabis use disorder, severe, dependence ([place]) [F12.20] 06/27/2017  . Hx of glaucoma [Z86.69] 10/02/2013   Total Time spent with patient: 20 minutes  Past Psychiatric History: Schizoaffective disorder bipolar type.  Past Medical History:  Past Medical History:  Diagnosis Date  . Allergy    Seasonal, Ultram (seizures)  . Anemia 2005  . Bipolar 1 disorder ([place])   . Glaucoma   . Seizures ([place]) 2008    Past Surgical History:  Procedure Laterality Date  . BREAST BIOPSY Right    x 2  . CESAREAN SECTION     x 2   Family History:  Family History  Problem Relation Age of Onset  . Hypertension Mother   . Hyperlipidemia Mother   . Parkinson's disease Father    Family Psychiatric  History: None reported. Social History:  History  Alcohol Use  .  2.4 oz/week  . 4 Cans of beer per week    Comment: Ocassional     History  Drug Use  . Types: Marijuana    Comment: percocet -last use over 1 week ago    Social History   Social History  . Marital status: Single    Spouse name: N/A  . Number of children: N/A  . Years of education: N/A   Social History Main Topics  . Smoking status: Current Every Day Smoker    Packs/day: 1.00    Years: 9.00  . Smokeless tobacco: Never Used     Comment: Patient not interested  . Alcohol use 2.4 oz/week    4 Cans of beer per week     Comment: Ocassional  . Drug use: Yes    Types: Marijuana     Comment: percocet -last use over 1 week ago  . Sexual activity: No   Other Topics Concern  . None   Social History Narrative  . None   Additional Social History:                         Sleep: Poor  Appetite:  Poor  Current Medications: Current Facility-Administered Medications  Medication Dose Route Frequency Provider Last Rate Last Dose  . acetaminophen (TYLENOL) tablet 650 mg  650 mg Oral Q6H PRN Clapacs, [name], MD   650 mg at 09/02/17 7829  . alum & mag hydroxide-simeth (MAALOX/MYLANTA) 200-200-20 MG/5ML suspension 30  mL  30 mL Oral Q4H PRN Clapacs, [name], MD      . ARIPiprazole (ABILIFY) tablet 30 mg  30 mg Oral Daily Clapacs, [name], MD   30 mg at 09/07/17 0813  . divalproex (DEPAKOTE) DR tablet 500 mg  500 mg Oral Q8H Dade Rodin B, MD   500 mg at 09/07/17 0634  . hydrOXYzine (ATARAX/VISTARIL) tablet 50 mg  50 mg Oral Q6H PRN [name], MD   50 mg at 09/05/17 2156  . ibuprofen (ADVIL,MOTRIN) tablet 600 mg  600 mg Oral Q6H PRN Clapacs, [name], MD   600 mg at 09/04/17 2109  . latanoprost (XALATAN) 0.005 % ophthalmic solution 1 drop  1 drop Both Eyes QHS Clapacs, [name], MD   1 drop at 09/06/17 2220  . lithium carbonate capsule 600 mg  600 mg Oral BID WC Clapacs, [name], MD   600 mg at 09/07/17 0813  . magnesium hydroxide (MILK OF MAGNESIA) suspension 30 mL  30  mL Oral Daily PRN Clapacs, [name], MD      . nicotine (NICODERM CQ - dosed in mg/24 hours) patch 21 mg  21 mg Transdermal Daily Clapacs, [name], MD   21 mg at 09/07/17 0813  . OLANZapine (ZYPREXA) tablet 30 mg  30 mg Oral QHS Clapacs, [name], MD   30 mg at 09/06/17 2220  . QUEtiapine (SEROQUEL) tablet 200 mg  200 mg Oral QHS PRN Clapacs, [name], MD   200 mg at 09/06/17 2220  . traZODone (DESYREL) tablet 100 mg  100 mg Oral QHS [name], MD   100 mg at 09/06/17 2220  . venlafaxine XR (EFFEXOR-XR) 24 hr capsule 150 mg  150 mg Oral Q breakfast Masha Orbach B, MD   150 mg at 09/07/17 1143    Lab Results:  Results for orders placed or performed during the hospital encounter of 09/01/17 (from the past 48 hour(s))  Lithium level     Status: None   Collection Time: 09/06/17  6:55 AM  Result Value Ref Range   Lithium Lvl 0.63 0.60 - 1.20 mmol/L  Valproic acid level     Status: None   Collection Time: 09/06/17  6:55 AM  Result Value Ref Range   Valproic Acid Lvl 78 50.0 - 100.0 ug/mL    Blood Alcohol level:  Lab Results  Component Value Date   ETH <10 08/31/2017   ETH <5 01/60/1093    Metabolic Disorder Labs: Lab Results  Component Value Date   HGBA1C 4.8 09/02/2017   MPG 91.06 09/02/2017   MPG 96.8 06/27/2017   No results found for: PROLACTIN Lab Results  Component Value Date   CHOL 165 09/02/2017   TRIG 97 09/02/2017   HDL 72 09/02/2017   CHOLHDL 2.3 09/02/2017   VLDL 19 09/02/2017   LDLCALC 74 09/02/2017   LDLCALC 79 06/27/2017    Physical Findings: AIMS: Facial and Oral Movements Muscles of Facial Expression: None, normal Lips and Perioral Area: None, normal Jaw: None, normal Tongue: None, normal,Extremity Movements Upper (arms, wrists, hands, fingers): None, normal Lower (legs, knees, ankles, toes): None, normal, Trunk Movements Neck, shoulders, hips: None, normal, Overall Severity Severity of abnormal movements (highest score from questions above):  None, normal Incapacitation due to abnormal movements: None, normal Patient's awareness of abnormal movements (rate only patient's report): No Awareness, Dental Status Current problems with teeth and/or dentures?: No Does patient usually wear dentures?: No  CIWA:    COWS:  Musculoskeletal: Strength & Muscle Tone: within normal limits Gait & Station: normal Patient leans: N/A  Psychiatric Specialty Exam: Physical Exam  Nursing note and vitals reviewed. Psychiatric: Her affect is blunt. Her speech is delayed. She is slowed, withdrawn and actively hallucinating. Thought content is paranoid and delusional. Cognition and memory are normal. She expresses impulsivity. She exhibits a depressed mood. She expresses suicidal ideation.    Review of Systems  Neurological: Negative.   Psychiatric/Behavioral: Positive for depression, hallucinations, substance abuse and suicidal ideas.  All other systems reviewed and are negative.   Blood pressure (!) 101/56, pulse (!) 58, temperature 97.7 F (36.5 C), temperature source Oral, resp. rate 16, height 5\' 2"  (1.575 m), weight 48.5 kg (107 lb), SpO2 99 %.Body mass index is 19.57 kg/m.  General Appearance: Fairly Groomed  Eye Contact:  Minimal  Speech:  Slow  Volume:  Decreased  Mood:  Depressed  Affect:  Blunt  Thought Process:  Disorganized and Descriptions of Associations: Tangential  Orientation:  Full (Time, Place, and Person)  Thought Content:  Delusions, Hallucinations: Auditory and Paranoid Ideation  Suicidal Thoughts:  Yes.  without intent/plan  Homicidal Thoughts:  No  Memory:  Immediate;   Fair Recent;   Fair Remote;   Fair  Judgement:  Poor  Insight:  Lacking  Psychomotor Activity:  Psychomotor Retardation  Concentration:  Concentration: Fair and Attention Span: Fair  Recall:  [employer] of Knowledge:  Fair  Language:  Fair  Akathisia:  No  Handed:  Right  AIMS (if indicated):     Assets:  Communication Skills Desire for  Improvement Financial Resources/Insurance Physical Health Resilience Social Support  ADL's:  Intact  Cognition:  WNL  Sleep:  Number of Hours: 7.5     Treatment Plan Summary: Daily contact with patient to assess and evaluate symptoms and progress in treatment and Medication management   Ms. Aughenbaugh is a 41 year old female with a history of bipolar disorder admitted for a psychotic break. She is still paranoid, delusional and hallucinating.   # Mood and psychosis - Depakote 500 mg TID, level on 10/30 78 - Lithium 600 mg BID, level on 10/30 0.63 - Zyprexa 30 mg nightly - Abilify 30 mg nightly - Start Effexor XR 150 mg daily  # Insomnia - Seroquel 200 mg nightly -Trazodone 10 mg as needed  # Smoking - nicotine patch is available  # Metabolic syndrome monitoring - labs completed on 06/27/2017 - EKG QTc 466  # Anxiety - Vistaril 25 mg TID  # Poor oral intake - Ensure  # Disposition - patient is homeless - follow up with [facility]  Orson Slick, MD 09/07/2017, 12:26 PM

## 2017-09-07 NOTE — Plan of Care (Signed)
Problem: Coping: Goal: Ability to verbalize frustrations and anger appropriately will improve Outcome: Progressing Patient verbalized frustration over her job to staff.

## 2017-09-08 MED ORDER — NAPHAZOLINE-GLYCERIN 0.012-0.2 % OP SOLN
1.0000 [drp] | Freq: Two times a day (BID) | OPHTHALMIC | Status: DC | PRN
  Administered 2017-09-08: 2 [drp] via OPHTHALMIC
  Filled 2017-09-08: qty 15

## 2017-09-08 NOTE — Progress Notes (Signed)
D:Pt denies SI/HI/AVH. Patient's affect is flat and sad but brightens upon approach. Patient's mood is pleasant, and she is cooperative with treatment plan. Patient's thoughts are disorganized with confusion noted to situation sometimes and was frequently reoriented as needed. Patient appears less anxious, she is interacting with peers and staff appropriately.  A:Pt was offered support and encouragement. Pt was given scheduled medications. Pt was encouraged to attend groups. Q 15 minute checks were done for safety.  R:Patient attends groups and interacts well with peers and staff. Pt is complaint with medication. Patient isreceptive to treatment and safety maintained on unit.

## 2017-09-08 NOTE — Progress Notes (Signed)
[facility] MD Progress Note  09/08/2017 2:06 PM Autumn [name]  MRN:  [id number]  Subjective:   Autumn [name] feels better today. She tells me that her boyfriend spoke to her mother and asked for Autumn [name]'s hand in marriage. I am not sure if this is delusional but the patient is much happier today. Apparently, she is still married to another man.  She is asking for discharge and wants to return to work ASAP. She is not ready.  Treatment plan. We continue Lithium Depakote, Zyprexa and Abilify. We continue Effexor. We will try to give Abilify maintena but the patient refuses.  Social/disposition. Unclear if she will return with the boyfriend of 17 years. If not, she is homeless. Follow up wup with dr. Kasandra Knudsen at [facility]. Unfortunately, she missed her last appointment and will have to wait until December. She has Multimedia programmer.    Principal Problem: Bipolar I disorder, most recent episode manic, severe with psychotic features ([place]) Diagnosis:   Patient Active Problem List   Diagnosis Date Noted  . Bipolar I disorder, most recent episode manic, severe with psychotic features ([place]) [F31.2] 07/05/2017  . Tobacco use disorder [F17.200] 06/27/2017  . 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 Opioid use disorder, severe, dependence ([place]) [F11.20] 06/27/2017  . Cannabis use disorder, severe, dependence ([place]) [F12.20] 06/27/2017  . Hx of glaucoma [Z86.69] 10/02/2013   Total Time spent with patient: 20 minutes  Past Psychiatric History: bipolar disorder  Past Medical History:  Past Medical History:  Diagnosis Date  . Allergy    Seasonal, Ultram (seizures)  . Anemia 2005  . Bipolar 1 disorder ([place])   . Glaucoma   . Seizures ([place]) 2008    Past Surgical History:  Procedure Laterality Date  . BREAST BIOPSY Right    x 2  . CESAREAN SECTION     x 2   Family History:  Family History  Problem Relation Age of Onset  . Hypertension Mother   . Hyperlipidemia Mother   . Parkinson's  disease Father    Family Psychiatric  History: none reported. Social History:  History  Alcohol Use  . 2.4 oz/week  . 4 Cans of beer per week    Comment: Ocassional     History  Drug Use  . Types: Marijuana    Comment: percocet -last use over 1 week ago    Social History   Social History  . Marital status: Single    Spouse name: N/A  . Number of children: N/A  . Years of education: N/A   Social History Main Topics  . Smoking status: Current Every Day Smoker    Packs/day: 1.00    Years: 9.00  . Smokeless tobacco: Never Used     Comment: Patient not interested  . Alcohol use 2.4 oz/week    4 Cans of beer per week     Comment: Ocassional  . Drug use: Yes    Types: Marijuana     Comment: percocet -last use over 1 week ago  . Sexual activity: No   Other Topics Concern  . None   Social History Narrative  . None   Additional Social History:                         Sleep: Fair  Appetite:  Fair  Current Medications: Current Facility-Administered Medications  Medication Dose Route Frequency Provider Last Rate Last Dose  . acetaminophen (TYLENOL) tablet 650 mg  650  mg Oral Q6H PRN Clapacs, [name], MD   650 mg at 09/02/17 [phone]  . alum & mag hydroxide-simeth (MAALOX/MYLANTA) 200-200-20 MG/5ML suspension 30 mL  30 mL Oral Q4H PRN Clapacs, [name], MD      . ARIPiprazole (ABILIFY) tablet 30 mg  30 mg Oral Daily Clapacs, [name], MD   30 mg at 09/08/17 0738  . divalproex (DEPAKOTE) DR tablet 500 mg  500 mg Oral Q8H Raudel Bazen B, MD   500 mg at 09/08/17 1342  . feeding supplement (ENSURE ENLIVE) (ENSURE ENLIVE) liquid 237 mL  237 mL Oral TID BM Shyteria Lewis B, MD   237 mL at 09/08/17 1342  . hydrOXYzine (ATARAX/VISTARIL) tablet 50 mg  50 mg Oral Q6H PRN [name], MD   50 mg at 09/05/17 2156  . ibuprofen (ADVIL,MOTRIN) tablet 600 mg  600 mg Oral Q6H PRN Clapacs, [name], MD   600 mg at 09/04/17 2109  . latanoprost (XALATAN) 0.005 % ophthalmic  solution 1 drop  1 drop Both Eyes QHS Clapacs, [name], MD   1 drop at 09/07/17 2146  . lithium carbonate capsule 600 mg  600 mg Oral BID WC Clapacs, [name], MD   600 mg at 09/08/17 0738  . magnesium hydroxide (MILK OF MAGNESIA) suspension 30 mL  30 mL Oral Daily PRN Clapacs, [name], MD      . nicotine (NICODERM CQ - dosed in mg/24 hours) patch 21 mg  21 mg Transdermal Daily Clapacs, [name], MD   21 mg at 09/08/17 0739  . OLANZapine (ZYPREXA) tablet 30 mg  30 mg Oral QHS Clapacs, [name], MD   30 mg at 09/07/17 2144  . QUEtiapine (SEROQUEL) tablet 200 mg  200 mg Oral QHS PRN Clapacs, [name], MD   200 mg at 09/06/17 2220  . traZODone (DESYREL) tablet 100 mg  100 mg Oral QHS [name], MD   100 mg at 09/07/17 2144  . venlafaxine XR (EFFEXOR-XR) 24 hr capsule 150 mg  150 mg Oral Q breakfast Gerry Heaphy B, MD   150 mg at 09/08/17 0737    Lab Results: No results found for this or any previous visit (from the past 48 hour(s)).  Blood Alcohol level:  Lab Results  Component Value Date   ETH <10 08/31/2017   ETH <5 38/75/6433    Metabolic Disorder Labs: Lab Results  Component Value Date   HGBA1C 4.8 09/02/2017   MPG 91.06 09/02/2017   MPG 96.8 06/27/2017   No results found for: PROLACTIN Lab Results  Component Value Date   CHOL 165 09/02/2017   TRIG 97 09/02/2017   HDL 72 09/02/2017   CHOLHDL 2.3 09/02/2017   VLDL 19 09/02/2017   LDLCALC 74 09/02/2017   LDLCALC 79 06/27/2017    Physical Findings: AIMS: Facial and Oral Movements Muscles of Facial Expression: None, normal Lips and Perioral Area: None, normal Jaw: None, normal Tongue: None, normal,Extremity Movements Upper (arms, wrists, hands, fingers): None, normal Lower (legs, knees, ankles, toes): None, normal, Trunk Movements Neck, shoulders, hips: None, normal, Overall Severity Severity of abnormal movements (highest score from questions above): None, normal Incapacitation due to abnormal movements: None,  normal Patient's awareness of abnormal movements (rate only patient's report): No Awareness, Dental Status Current problems with teeth and/or dentures?: No Does patient usually wear dentures?: No  CIWA:    COWS:     Musculoskeletal: Strength & Muscle Tone: within normal limits Gait & Station: normal Patient leans:  N/A  Psychiatric Specialty Exam: Physical Exam  Nursing note and vitals reviewed. Psychiatric: Her speech is normal. Her affect is blunt. She is slowed, withdrawn and actively hallucinating. Thought content is paranoid and delusional. Cognition and memory are normal. She expresses impulsivity. She exhibits a depressed mood.    Review of Systems  Constitutional: Positive for malaise/fatigue.  Neurological: Negative.   Psychiatric/Behavioral: Positive for depression, hallucinations and substance abuse.  All other systems reviewed and are negative.   Blood pressure (!) 99/58, pulse 74, temperature 97.7 F (36.5 C), resp. rate 16, height 5\' 2"  (1.575 m), weight 48.5 kg (107 lb), SpO2 99 %.Body mass index is 19.57 kg/m.  General Appearance: Fairly Groomed  Eye Contact:  Fair  Speech:  Clear and Coherent  Volume:  Normal  Mood:  Anxious and Depressed  Affect:  Appropriate  Thought Process:  Disorganized and Descriptions of Associations: Tangential  Orientation:  Full (Time, Place, and Person)  Thought Content:  Delusions, Hallucinations: Auditory and Paranoid Ideation  Suicidal Thoughts:  No  Homicidal Thoughts:  No  Memory:  Immediate;   Fair Recent;   Fair Remote;   Fair  Judgement:  Poor  Insight:  Lacking  Psychomotor Activity:  Decreased  Concentration:  Concentration: Fair and Attention Span: Fair  Recall:  [employer] of Knowledge:  Fair  Language:  Fair  Akathisia:  No  Handed:  Right  AIMS (if indicated):     Assets:  Communication Skills Desire for Improvement Financial Resources/Insurance Physical Health Resilience Social  Support Vocational/Educational  ADL's:  Intact  Cognition:  WNL  Sleep:  Number of Hours: 7.75     Treatment Plan Summary: Daily contact with patient to assess and evaluate symptoms and progress in treatment and Medication management   Ms. Frerichs is a 41 year old female with a history of bipolar disorder admitted for a psychotic break. She is still paranoid, delusional and hallucinating.   # Mood and psychosis - Depakote 500 mg TID, level on 10/30 78 - Lithium 600 mg BID, level on 10/30 0.63 - Zyprexa 30 mg nightly - Abilify 30 mg nightly - Continue Effexor XR 150 mg daily  # Insomnia - Seroquel 200 mg nightly -Trazodone 10 mg as needed  # Smoking - nicotine patch is available  # Metabolic syndrome monitoring - labs completed on 06/27/2017 - EKG QTc 466  # Anxiety - Vistaril 25 mg TID  # Poor oral intake - Ensure  # Disposition - patient is homeless - follow up with [facility]  Orson Slick, MD 09/08/2017, 2:06 PM

## 2017-09-08 NOTE — Progress Notes (Addendum)
Recreation Therapy Notes  Date: 11.01.18  Time: 9:30 am  Location: Craft Room  Behavioral response: Appropriate   Intervention Topic: Values  Discussion/Intervention: Group content today was focused on values. The group identified what values are and where they come from. Individuals expressed some values and how many they have. Patients described how they go about adding or removing values. The group described the importance of having values and how they go about using them in daily life. The group worked on worksheets that helped them define certain values and pick out important values.   Clinical Observations/Feedback:  Patient came to group and was engaged with her peers and staff during group. She identified several values that are important to her. Individual described values as the way you go about your life. Patient expressed values are important because they help you make the right decision in life.   Cahterine Heinzel LRT/CTRS      Kayvon Mo 09/08/2017 10:22 AM

## 2017-09-08 NOTE — Progress Notes (Signed)
Recreation Therapy Notes   Date: 11.01.18  Time: 3:00pm  Location: Outside  Behavioral response: N/A  Group Type: Leisure  Participation level: N/A  Communication: Patient did not attend group.  Comments: N/A  Madie Cahn LRT/CTRS        Shaylin Blatt 09/08/2017 4:21 PM

## 2017-09-08 NOTE — [facility] Group Notes (Signed)
[facility] Group Notes:  (Nursing/MHT/Case Management/Adjunct)  Date:  09/08/2017  Time:  6:58 PM  Type of Therapy:  Psychoeducational Skills  Participation Level:  Active  Participation Quality:  Appropriate and Attentive  Affect:  Appropriate  Cognitive:  Alert and Appropriate  Insight:  Good  Engagement in Group:  Engaged and Supportive  Modes of Intervention:  Education  Summary of Progress/Problems:  Lakeysha Slutsky A Koltan Portocarrero 09/08/2017, 6:58 PM

## 2017-09-08 NOTE — [facility] Group Notes (Signed)
Goals Group Date/Time: 09/08/2017 9:00 AM Type of Therapy and Topic: Group Therapy: Goals Group: SMART Goals   Participation Level: Moderate  Description of Group:    The purpose of a daily goals group is to assist and guide patients in setting recovery/wellness-related goals. The objective is to set goals as they relate to the crisis in which they were admitted. Patients will be using SMART goal modalities to set measurable goals. Characteristics of realistic goals will be discussed and patients will be assisted in setting and processing how one will reach their goal. Facilitator will also assist patients in applying interventions and coping skills learned in psycho-education groups to the SMART goal and process how one will achieve defined goal.   Therapeutic Goals:   -Patients will develop and document one goal related to or their crisis in which brought them into treatment.  -Patients will be guided by LCSW using SMART goal setting modality in how to set a measurable, attainable, realistic and time sensitive goal.  -Patients will process barriers in reaching goal.  -Patients will process interventions in how to overcome and successful in reaching goal.   Patient's Goal: Pull myself out of my depression.   Therapeutic Modalities:  Motivational Interviewing  Art gallery manager  SMART goals setting   Lurline Idol, [place]

## 2017-09-08 NOTE — Plan of Care (Signed)
Problem: Coping: Goal: Ability to verbalize feelings will improve Outcome: Progressing Patient verbalized feelings to staff.    

## 2017-09-08 NOTE — [facility] Group Notes (Signed)
[facility] LCSW Group Therapy Note  Date/Time: 09/08/17, 1300  Type of Therapy/Topic:  Group Therapy:  Balance in Life  Participation Level:  active  Description of Group:    This group will address the concept of balance and how it feels and looks when one is unbalanced. Patients will be encouraged to process areas in their lives that are out of balance, and identify reasons for remaining unbalanced. Facilitators will guide patients utilizing problem- solving interventions to address and correct the stressor making their life unbalanced. Understanding and applying boundaries will be explored and addressed for obtaining  and maintaining a balanced life. Patients will be encouraged to explore ways to assertively make their unbalanced needs known to significant others in their lives, using other group members and facilitator for support and feedback.  Therapeutic Goals: 1. Patient will identify two or more emotions or situations they have that consume much of in their lives. 2. Patient will identify signs/triggers that life has become out of balance:  3. Patient will identify two ways to set boundaries in order to achieve balance in their lives:  4. Patient will demonstrate ability to communicate their needs through discussion and/or role plays  Summary of Patient Progress: Pt is attentive to the group and the discussion but relates most questions, no matter the question, back to her problems at work or with her mother.            Therapeutic Modalities:   Cognitive Behavioral Therapy Solution-Focused Therapy Assertiveness Training  Lurline Idol, [place]

## 2017-09-08 NOTE — Progress Notes (Signed)
Data: Patient is alert and oriented, reports no AVH, and has denied SI/HI at this time. Patient is has an appropriate mood and affect, she spontaneously smiles. Patient has no physical complaints and a pain rating of 0/10. Patient reports "good" sleep for 7h87m, appetite is good. Patient rates depression "0/10" , Feelings of hopelessness "0/10" and Anxiety "2/10" Patients goal for today is to "comply with regulations to be discharged."   Action:  Q x 15 minute observation checks were completed for safety. Patient was provided with education on medications. Patient was offered support and encouragement. Patient was given scheduled medications. Patient  was encourage to attend groups, participate in unit activities and continue with plan of care.    Response: Patient is med compliant. Patient has no complaints at this time. Patient is receptive to treatment and safety maintained on unit.

## 2017-09-08 NOTE — Social Work (Addendum)
Patient had an appt w [facility], Dr Kasandra Knudsen, in early August 2018 - per office, patient cancelled this appointment.  The earliest time she can be rescheduled for a new patient appt is 10/20/17.  [facility] is out of network for this patient and she will need to pay for each visit.  Edwyna Shell, LCSW Lead Clinical Social Worker Phone:  [phone]

## 2017-09-09 NOTE — Plan of Care (Signed)
Problem: Education: Goal: Will be free of psychotic symptoms Outcome: Progressing Denies hallucinations, denies suicidal thoughts.

## 2017-09-09 NOTE — [facility] Group Notes (Signed)
[facility] Group Notes:  (Nursing/MHT/Case Management/Adjunct)  Date:  09/09/2017  Time:  5:02 AM  Type of Therapy:  Psychoeducational Skills  Participation Level:  Did Not Attend  Summary of Progress/Problems:  Autumn [name] 09/09/2017, 5:02 AM

## 2017-09-09 NOTE — Plan of Care (Signed)
Problem: Coping: Goal: Ability to cope will improve Outcome: Progressing Stayed in the milieu with staff and peers. Interacting appropriately

## 2017-09-09 NOTE — Progress Notes (Signed)
Recreation Therapy Notes   Date: 11.02.18  Time: 3:00pm  Location: Craft room  Behavioral response: Appropriate  Group Type: Art/Craft  Participation level: Active  Communication: Patient was social with peers and staff.  Comments: N/A  Karlina Suares LRT/CTRS       Unique Sillas 09/09/2017 4:29 PM

## 2017-09-09 NOTE — Plan of Care (Signed)
Problem: Education: Goal: Will be free of psychotic symptoms Outcome: Progressing Alert and oriented

## 2017-09-09 NOTE — Plan of Care (Signed)
Problem: Education: Goal: Knowledge of the prescribed therapeutic regimen will improve Outcome: Progressing Understand medication regime and remains compliant

## 2017-09-09 NOTE — Progress Notes (Signed)
Denies SI/HI/AVH. Affect blunted.  Visible in the milieu.  Medication compliant.  Support offered.  Safety maintained.

## 2017-09-09 NOTE — Progress Notes (Signed)
[facility] MD Progress Note  09/09/2017 5:03 PM Autumn [name]  MRN:  [id number]  Subjective:  [name] reports much improvement. She denies any symptoms of depression, anxiety or psychosis. Actually, she feels "great". She is grandiose and delusional. She is more social and participates in programming.  Treatment plan. We continue Depakote and Lithium for mood stabilization and Abilify and Zyprexa for psychosis. She is possibly entering a manic phase after we started Effexor for depression. There is a question of compliance in the community.  Social/disposition. Not at all clear. She will likely return to the boarding hause with her boyfriend. She still has a job and Scientist, product/process development. She refuses to apply for disability. There is also a question of substance abuse. She may not return to [facility] IOP at [facility] but she will follow up with them for medication management.   Principal Problem: Bipolar I disorder, most recent episode manic, severe with psychotic features ([place]) Diagnosis:   Patient Active Problem List   Diagnosis Date Noted  . Bipolar I disorder, most recent episode manic, severe with psychotic features ([place]) [F31.2] 07/05/2017  . Tobacco use disorder [F17.200] 06/27/2017  . 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 Opioid use disorder, severe, dependence ([place]) [F11.20] 06/27/2017  . Cannabis use disorder, severe, dependence ([place]) [F12.20] 06/27/2017  . Hx of glaucoma [Z86.69] 10/02/2013   Total Time spent with patient: 20 minutes  Past Psychiatric History: schizoaffective disorder.  Past Medical History:  Past Medical History:  Diagnosis Date  . Allergy    Seasonal, Ultram (seizures)  . Anemia 2005  . Bipolar 1 disorder ([place])   . Glaucoma   . Seizures ([place]) 2008    Past Surgical History:  Procedure Laterality Date  . BREAST BIOPSY Right    x 2  . CESAREAN SECTION     x 2   Family History:  Family History  Problem Relation Age of Onset  .  Hypertension Mother   . Hyperlipidemia Mother   . Parkinson's disease Father    Family Psychiatric  History: none reported. Social History:  History  Alcohol Use  . 2.4 oz/week  . 4 Cans of beer per week    Comment: Ocassional     History  Drug Use  . Types: Marijuana    Comment: percocet -last use over 1 week ago    Social History   Social History  . Marital status: Single    Spouse name: N/A  . Number of children: N/A  . Years of education: N/A   Social History Main Topics  . Smoking status: Current Every Day Smoker    Packs/day: 1.00    Years: 9.00  . Smokeless tobacco: Never Used     Comment: Patient not interested  . Alcohol use 2.4 oz/week    4 Cans of beer per week     Comment: Ocassional  . Drug use: Yes    Types: Marijuana     Comment: percocet -last use over 1 week ago  . Sexual activity: No   Other Topics Concern  . None   Social History Narrative  . None   Additional Social History:                         Sleep: Fair  Appetite:  Fair  Current Medications: Current Facility-Administered Medications  Medication Dose Route Frequency Provider Last Rate Last Dose  . acetaminophen (TYLENOL) tablet 650 mg  650 mg Oral Q6H  PRN Gonzella Lex, MD   650 mg at 09/02/17 [phone]  . alum & mag hydroxide-simeth (MAALOX/MYLANTA) 200-200-20 MG/5ML suspension 30 mL  30 mL Oral Q4H PRN Clapacs, [name], MD      . ARIPiprazole (ABILIFY) tablet 30 mg  30 mg Oral Daily Clapacs, [name], MD   30 mg at 09/09/17 1224  . divalproex (DEPAKOTE) DR tablet 500 mg  500 mg Oral Q8H Pavan Bring B, MD   500 mg at 09/09/17 1414  . feeding supplement (ENSURE ENLIVE) (ENSURE ENLIVE) liquid 237 mL  237 mL Oral TID BM Aveah Castell B, MD   237 mL at 09/08/17 2021  . hydrOXYzine (ATARAX/VISTARIL) tablet 50 mg  50 mg Oral Q6H PRN [name], MD   50 mg at 09/05/17 2156  . ibuprofen (ADVIL,MOTRIN) tablet 600 mg  600 mg Oral Q6H PRN Clapacs, [name], MD   600  mg at 09/04/17 2109  . latanoprost (XALATAN) 0.005 % ophthalmic solution 1 drop  1 drop Both Eyes QHS Clapacs, [name], MD   1 drop at 09/08/17 2158  . lithium carbonate capsule 600 mg  600 mg Oral BID WC Clapacs, [name], MD   600 mg at 09/09/17 1625  . magnesium hydroxide (MILK OF MAGNESIA) suspension 30 mL  30 mL Oral Daily PRN Clapacs, [name], MD      . naphazoline-glycerin (CLEAR EYES) ophth solution 1-2 drop  1-2 drop Both Eyes BID PRN [name], [name], MD   2 drop at 09/08/17 1739  . nicotine (NICODERM CQ - dosed in mg/24 hours) patch 21 mg  21 mg Transdermal Daily Clapacs, [name], MD   21 mg at 09/09/17 1226  . OLANZapine (ZYPREXA) tablet 30 mg  30 mg Oral QHS Clapacs, [name], MD   30 mg at 09/08/17 2158  . QUEtiapine (SEROQUEL) tablet 200 mg  200 mg Oral QHS PRN Clapacs, [name], MD   200 mg at 09/06/17 2220  . traZODone (DESYREL) tablet 100 mg  100 mg Oral QHS [name], MD   100 mg at 09/08/17 2159  . venlafaxine XR (EFFEXOR-XR) 24 hr capsule 150 mg  150 mg Oral Q breakfast Amaryllis Malmquist B, MD   150 mg at 09/09/17 1225    Lab Results: No results found for this or any previous visit (from the past 48 hour(s)).  Blood Alcohol level:  Lab Results  Component Value Date   ETH <10 08/31/2017   ETH <5 12/87/8676    Metabolic Disorder Labs: Lab Results  Component Value Date   HGBA1C 4.8 09/02/2017   MPG 91.06 09/02/2017   MPG 96.8 06/27/2017   No results found for: PROLACTIN Lab Results  Component Value Date   CHOL 165 09/02/2017   TRIG 97 09/02/2017   HDL 72 09/02/2017   CHOLHDL 2.3 09/02/2017   VLDL 19 09/02/2017   LDLCALC 74 09/02/2017   LDLCALC 79 06/27/2017    Physical Findings: AIMS: Facial and Oral Movements Muscles of Facial Expression: None, normal Lips and Perioral Area: None, normal Jaw: None, normal Tongue: None, normal,Extremity Movements Upper (arms, wrists, hands, fingers): None, normal Lower (legs, knees, ankles, toes): None, normal, Trunk  Movements Neck, shoulders, hips: None, normal, Overall Severity Severity of abnormal movements (highest score from questions above): None, normal Incapacitation due to abnormal movements: None, normal Patient's awareness of abnormal movements (rate only patient's report): No Awareness, Dental Status Current problems with teeth and/or dentures?: No Does patient usually wear dentures?: No  CIWA:    COWS:     Musculoskeletal: Strength & Muscle Tone: within normal limits Gait & Station: normal Patient leans: N/A  Psychiatric Specialty Exam: Physical Exam  Nursing note and vitals reviewed. Psychiatric: She has a normal mood and affect. Her speech is normal and behavior is normal. Thought content is delusional. Cognition and memory are normal. She expresses impulsivity.    Review of Systems  Neurological: Negative.   Psychiatric/Behavioral: Positive for depression and substance abuse. The patient is nervous/anxious.   All other systems reviewed and are negative.   Blood pressure 91/61, pulse 67, temperature 98 F (36.7 C), temperature source Oral, resp. rate 18, height 5\' 2"  (1.575 m), weight 48.5 kg (107 lb), SpO2 99 %.Body mass index is 19.57 kg/m.  General Appearance: Casual  Eye Contact:  Good  Speech:  Clear and Coherent  Volume:  Normal  Mood:  Euphoric  Affect:  Congruent  Thought Process:  Disorganized and Descriptions of Associations: Tangential  Orientation:  Full (Time, Place, and Person)  Thought Content:  Delusions and Paranoid Ideation  Suicidal Thoughts:  No  Homicidal Thoughts:  No  Memory:  Immediate;   Fair Recent;   Fair Remote;   Fair  Judgement:  Poor  Insight:  Lacking  Psychomotor Activity:  Normal  Concentration:  Concentration: Fair and Attention Span: Fair  Recall:  [employer] of Knowledge:  Fair  Language:  Fair  Akathisia:  No  Handed:  Right  AIMS (if indicated):     Assets:  Communication Skills Desire for Improvement Financial  Resources/Insurance Physical Health Resilience Social Support  ADL's:  Intact  Cognition:  WNL  Sleep:  Number of Hours: 6.15     Treatment Plan Summary: Daily contact with patient to assess and evaluate symptoms and progress in treatment and Medication management   Ms. Madrigal is a 41 year old female with a history of bipolar disorder admitted for a psychotic break. She is still paranoid, delusional and hallucinating.   # Mood and psychosis - Depakote 500 mg TID, level on 10/30 78 - Lithium 600 mg BID, level on 10/30 0.63 - Zyprexa 30 mg nightly - Abilify 30 mg nightly - Continue Effexor XR 150 mg daily -watch for symptoms of mania  # Insomnia - Seroquel 200 mg nightly -Trazodone 100 mg nightly  # Smoking - nicotine patch is available  # Metabolic syndrome monitoring - labs completed on 06/27/2017 - EKG QTc 466  # Anxiety - Vistaril 25 mg TID  # Poor oral intake - Ensure  # Substance abuse -not sure if this is a problem now, there were no symptoms of alcohol withdrawal  #Disposition - patient is homeless - follow up with [facility] for medication management  Orson Slick, MD 09/09/2017, 5:03 PM

## 2017-09-09 NOTE — Plan of Care (Signed)
Problem: Coping: Goal: Ability to verbalize feelings will improve Outcome: Progressing Verbalizes feelings without difficulty.

## 2017-09-09 NOTE — Plan of Care (Signed)
Problem: Education: Goal: Will be free of psychotic symptoms Outcome: Progressing Alert and oriented. Denying hallucinations, denying SI/HI

## 2017-09-09 NOTE — Progress Notes (Signed)
Recreation Therapy Notes  Date: 11.02.18  Time: 9:30 am  Location: Craft Room  Behavioral response: Appropriate  Intervention Topic: Stress   Discussion/Intervention: Group content on today was focused stress. The group defined stress and way to cope with stress. Participants expressed how they know when they are stresses out. Individuals described the different ways they have to cope with stress. The group stated reasons why it is important to cope with stress. Patient explained what good stress is and some examples. The group participated in the intervention "Stress Management Jeopardy". Individuals were separated into two group and answered questions related to stress.  Clinical Observations/Feedback:  Patient came to group late for unknown reasons. She was engaged with her peers and staff during group. While in group she expressed that others can tell she is stress because she starts to move around a lot. Individual stated that it is important to deal with stress because it can lead into a panic attack.  Damaso Laday LRT/CTRS         Ramisa Duman 09/09/2017 1:09 PM

## 2017-09-09 NOTE — Plan of Care (Signed)
Problem: Coping: Goal: Ability to verbalize frustrations and anger appropriately will improve Outcome: Progressing Has been talking to staff, expressing her needs

## 2017-09-09 NOTE — Plan of Care (Signed)
Problem: Coping: Goal: Ability to verbalize frustrations and anger appropriately will improve Outcome: Progressing Able to express thoughts and concerns

## 2017-09-09 NOTE — Plan of Care (Signed)
Problem: Education: Goal: Knowledge of the prescribed therapeutic regimen will improve Outcome: Progressing Verbalized understanding of medication regime

## 2017-09-09 NOTE — BHH Group Notes (Signed)
[place] LCSW Group Therapy Note  Date/Time: 09/09/17, 1300  Type of Therapy and Topic:  Group Therapy:  Feelings around Relapse and Recovery  Participation Level:  Active   Mood:pleasant  Description of Group:    Patients in this group will discuss emotions they experience before and after a relapse. They will process how experiencing these feelings, or avoidance of experiencing them, relates to having a relapse. Facilitator will guide patients to explore emotions they have related to recovery. Patients will be encouraged to process which emotions are more powerful. They will be guided to discuss the emotional reaction significant others in their lives may have to patients' relapse or recovery. Patients will be assisted in exploring ways to respond to the emotions of others without this contributing to a relapse.  Therapeutic Goals: 1. Patient will identify two or more emotions that lead to relapse for them:  2. Patient will identify two emotions that result when they relapse:  3. Patient will identify two emotions related to recovery:  4. Patient will demonstrate ability to communicate their needs through discussion and/or role plays.   Summary of Patient Progress:Pt identified guilt as an emotion that leads to relapse for her.  Pt shared some about substance use problems in her past.  Pt participated in group discussion regarding problems caused by turning to substances as a way of managing difficult emotions.       Therapeutic Modalities:   Cognitive Behavioral Therapy Solution-Focused Therapy Assertiveness Training Relapse Prevention Therapy  Lurline Idol, LCSW

## 2017-09-09 NOTE — Plan of Care (Signed)
Problem: Education: Goal: Mental status will improve Outcome: Progressing Alert and oriented    

## 2017-09-09 NOTE — Progress Notes (Signed)
Patient was visible in the milieu. Attended group and was active. Remained cooperative. Denying thoughts of self harm "I am feeling great, everything going alright". Had medications and snack. Verbalized understanding of her medication regime. Currently in bed resting. No sign of distress. Safety precautions maintained.

## 2017-09-10 NOTE — BHH Group Notes (Signed)
[place] Group Notes:  (Nursing/MHT/Case Management/Adjunct)  Date:  09/10/2017  Time:  5:07 AM  Type of Therapy:  Psychoeducational Skills  Participation Level:  Active  Participation Quality:  Appropriate, Attentive and Sharing  Affect:  Appropriate  Cognitive:  Appropriate  Insight:  Appropriate and Good  Engagement in Group:  Engaged  Modes of Intervention:  Discussion, Socialization and Support  Summary of Progress/Problems:  [name] 09/10/2017, 5:07 AM

## 2017-09-10 NOTE — BHH Suicide Risk Assessment (Signed)
[place] INPATIENT:  Family/Significant Other Suicide Prevention Education  Suicide Prevention Education:  Education Completed;Cynthia Diona Foley [phone] (mother) has been identified by the patient as the family member/significant other with whom the patient will be residing, and identified as the person(s) who will aid the patient in the event of a mental health crisis (suicidal ideations/suicide attempt).  With written consent from the patient, the family member/significant other has been provided the following suicide prevention education, prior to the and/or following the discharge of the patient.  The suicide prevention education provided includes the following:  Suicide risk factors  Suicide prevention and interventions  National Suicide Hotline telephone number  [facility] assessment telephone number  [facility] Emergency Assistance [place] and/or Residential Mobile Crisis Unit telephone number  Request made of family/significant other to:  Remove weapons (e.g., guns, rifles, knives), all items previously/currently identified as safety concern.    Remove drugs/medications (over-the-counter, prescriptions, illicit drugs), all items previously/currently identified as a safety concern.  The family member/significant other verbalizes understanding of the suicide prevention education information provided.  The family member/significant other agrees to remove the items of safety concern listed above.  [place] MSW, LCSW  09/10/2017, 2:52 PM

## 2017-09-10 NOTE — BHH Counselor (Signed)
CSW attempted to complete SPE with mother Uvaldo Rising [phone] on 09/10/2017 at 2:46 PM. CSW left message requesting call back.   [place] MSW, LCSW  09/10/2017 2:46 PM

## 2017-09-10 NOTE — Progress Notes (Signed)
[facility] MD Progress Note  09/10/2017 5:22 PM Jezel Basto  MRN:  [id number]  Subjective:   Ms. Michalski continues to stabilize. She is still somewhat disorganized and delusional but otherwise close to baseline. She now agrees to take medications at home but has never been compliant. She refuses injectable antipsychotics as she did not have period during [place] sustenna injections.   Spoke with the mother who reported that the patient has pituitary tumor.  Treament plan. We will continue all her medications.  Social/disposition. Her boyfriend will visit tonight will se if she can return home with him. Follow up with [facility].  Principal Problem: Bipolar I disorder, most recent episode manic, severe with psychotic features ([place]) Diagnosis:   Patient Active Problem List   Diagnosis Date Noted  . Bipolar I disorder, most recent episode manic, severe with psychotic features ([place]) [F31.2] 07/05/2017  . Tobacco use disorder [F17.200] 06/27/2017  . 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 Opioid use disorder, severe, dependence ([place]) [F11.20] 06/27/2017  . Cannabis use disorder, severe, dependence ([place]) [F12.20] 06/27/2017  . Hx of glaucoma [Z86.69] 10/02/2013   Total Time spent with patient: 20 minutes  Past Psychiatric History: schizoaffective disorder.  Past Medical History:  Past Medical History:  Diagnosis Date  . Allergy    Seasonal, Ultram (seizures)  . Anemia 2005  . Bipolar 1 disorder ([place])   . Glaucoma   . Seizures ([place]) 2008    Past Surgical History:  Procedure Laterality Date  . BREAST BIOPSY Right    x 2  . CESAREAN SECTION     x 2   Family History:  Family History  Problem Relation Age of Onset  . Hypertension Mother   . Hyperlipidemia Mother   . Parkinson's disease Father    Family Psychiatric  History: None. Social History:  History  Alcohol Use  . 2.4 oz/week  . 4 Cans of beer per week    Comment: Ocassional     History  Drug Use   . Types: Marijuana    Comment: percocet -last use over 1 week ago    Social History   Social History  . Marital status: Single    Spouse name: N/A  . Number of children: N/A  . Years of education: N/A   Social History Main Topics  . Smoking status: Current Every Day Smoker    Packs/day: 1.00    Years: 9.00  . Smokeless tobacco: Never Used     Comment: Patient not interested  . Alcohol use 2.4 oz/week    4 Cans of beer per week     Comment: Ocassional  . Drug use: Yes    Types: Marijuana     Comment: percocet -last use over 1 week ago  . Sexual activity: No   Other Topics Concern  . None   Social History Narrative  . None   Additional Social History:                         Sleep: Fair  Appetite:  Fair  Current Medications: Current Facility-Administered Medications  Medication Dose Route Frequency Provider Last Rate Last Dose  . acetaminophen (TYLENOL) tablet 650 mg  650 mg Oral Q6H PRN Clapacs, [name], MD   650 mg at 09/02/17 8101  . alum & mag hydroxide-simeth (MAALOX/MYLANTA) 200-200-20 MG/5ML suspension 30 mL  30 mL Oral Q4H PRN Clapacs, [name], MD      . ARIPiprazole (ABILIFY)  tablet 30 mg  30 mg Oral Daily Clapacs, [name], MD   30 mg at 09/10/17 0844  . divalproex (DEPAKOTE) DR tablet 500 mg  500 mg Oral Q8H Cregg Jutte B, MD   500 mg at 09/10/17 1316  . feeding supplement (ENSURE ENLIVE) (ENSURE ENLIVE) liquid 237 mL  237 mL Oral TID BM Fryda Molenda B, MD   237 mL at 09/10/17 1400  . hydrOXYzine (ATARAX/VISTARIL) tablet 50 mg  50 mg Oral Q6H PRN [name], MD   50 mg at 09/05/17 2156  . ibuprofen (ADVIL,MOTRIN) tablet 600 mg  600 mg Oral Q6H PRN Clapacs, [name], MD   600 mg at 09/04/17 2109  . latanoprost (XALATAN) 0.005 % ophthalmic solution 1 drop  1 drop Both Eyes QHS Clapacs, [name], MD   1 drop at 09/09/17 2208  . lithium carbonate capsule 600 mg  600 mg Oral BID WC Clapacs, [name], MD   600 mg at 09/10/17 1658  .  magnesium hydroxide (MILK OF MAGNESIA) suspension 30 mL  30 mL Oral Daily PRN Clapacs, [name], MD      . naphazoline-glycerin (CLEAR EYES) ophth solution 1-2 drop  1-2 drop Both Eyes BID PRN [name], [name], MD   2 drop at 09/08/17 1739  . nicotine (NICODERM CQ - dosed in mg/24 hours) patch 21 mg  21 mg Transdermal Daily Clapacs, [name], MD   21 mg at 09/10/17 0844  . OLANZapine (ZYPREXA) tablet 30 mg  30 mg Oral QHS Clapacs, [name], MD   30 mg at 09/09/17 2208  . QUEtiapine (SEROQUEL) tablet 200 mg  200 mg Oral QHS PRN Clapacs, [name], MD   200 mg at 09/06/17 2220  . traZODone (DESYREL) tablet 100 mg  100 mg Oral QHS [name], MD   100 mg at 09/09/17 2208  . venlafaxine XR (EFFEXOR-XR) 24 hr capsule 150 mg  150 mg Oral Q breakfast Mila Pair B, MD   150 mg at 09/10/17 0844    Lab Results: No results found for this or any previous visit (from the past 48 hour(s)).  Blood Alcohol level:  Lab Results  Component Value Date   ETH <10 08/31/2017   ETH <5 76/73/4193    Metabolic Disorder Labs: Lab Results  Component Value Date   HGBA1C 4.8 09/02/2017   MPG 91.06 09/02/2017   MPG 96.8 06/27/2017   No results found for: PROLACTIN Lab Results  Component Value Date   CHOL 165 09/02/2017   TRIG 97 09/02/2017   HDL 72 09/02/2017   CHOLHDL 2.3 09/02/2017   VLDL 19 09/02/2017   LDLCALC 74 09/02/2017   LDLCALC 79 06/27/2017    Physical Findings: AIMS: Facial and Oral Movements Muscles of Facial Expression: None, normal Lips and Perioral Area: None, normal Jaw: None, normal Tongue: None, normal,Extremity Movements Upper (arms, wrists, hands, fingers): None, normal Lower (legs, knees, ankles, toes): None, normal, Trunk Movements Neck, shoulders, hips: None, normal, Overall Severity Severity of abnormal movements (highest score from questions above): None, normal Incapacitation due to abnormal movements: None, normal Patient's awareness of abnormal movements (rate only  patient's report): No Awareness, Dental Status Current problems with teeth and/or dentures?: No Does patient usually wear dentures?: No  CIWA:    COWS:     Musculoskeletal: Strength & Muscle Tone: within normal limits Gait & Station: normal Patient leans: N/A  Psychiatric Specialty Exam: Physical Exam  Nursing note and vitals reviewed. Psychiatric: She has a normal  mood and affect. Her speech is normal and behavior is normal. Thought content is paranoid and delusional. Cognition and memory are normal. She expresses impulsivity.    Review of Systems  Neurological: Negative.   Psychiatric/Behavioral: Positive for substance abuse. The patient is nervous/anxious.   All other systems reviewed and are negative.   Blood pressure 104/66, pulse 76, temperature 97.8 F (36.6 C), temperature source Oral, resp. rate 18, height 5\' 2"  (1.575 m), weight 48.5 kg (107 lb), SpO2 99 %.Body mass index is 19.57 kg/m.  General Appearance: Casual  Eye Contact:  Good  Speech:  Clear and Coherent  Volume:  Normal  Mood:  Anxious  Affect:  Appropriate  Thought Process:  Disorganized and Descriptions of Associations: Tangential  Orientation:  Full (Time, Place, and Person)  Thought Content:  Delusions and Paranoid Ideation  Suicidal Thoughts:  No  Homicidal Thoughts:  No  Memory:  Immediate;   Fair Recent;   Fair Remote;   Fair  Judgement:  Poor  Insight:  Lacking  Psychomotor Activity:  Normal  Concentration:  Concentration: Fair and Attention Span: Fair  Recall:  [employer] of Knowledge:  Fair  Language:  Fair  Akathisia:  No  Handed:  Right  AIMS (if indicated):     Assets:  Communication Skills Desire for Improvement Financial Resources/Insurance Physical Health Resilience Social Support  ADL's:  Intact  Cognition:  WNL  Sleep:  Number of Hours: 6.15     Treatment Plan Summary: Daily contact with patient to assess and evaluate symptoms and progress in treatment and Medication  management   Ms. Penaflor is a 41 year old female with a history of bipolar disorder admitted for a psychotic break. She is still paranoid, delusional and hallucinating.   # Mood and psychosis - Depakote 500 mg TID, level on 10/30 78 - Lithium 600 mg BID, level on 10/30 0.63 - Zyprexa 30 mg nightly - Abilify 30 mg nightly - Continue Effexor XR 150 mg daily -watch for symptoms of mania  # Insomnia - Seroquel 200 mg nightly -Trazodone 100 mg nightly  # Smoking - nicotine patch is available  # Metabolic syndrome monitoring - labs completed on 06/27/2017 - EKG QTc 466  # Anxiety - Vistaril 25 mg TID  # Poor oral intake - Ensure  # Substance abuse -not sure if this is a problem now, there were no symptoms of alcohol withdrawal  #Disposition - patient is homeless - follow up with [facility] for medication management  Orson Slick, MD 09/10/2017, 5:22 PM

## 2017-09-10 NOTE — Progress Notes (Signed)
Denies SI/HI/AVH.  Pleasant and cooperative.  Scheduled medications given.  Support offered.  Safety maintained on the unit.

## 2017-09-10 NOTE — Plan of Care (Signed)
Problem: Education: Goal: Will be free of psychotic symptoms Outcome: Progressing Alert and oriented.  No signs of hallucinations or delusions noted.

## 2017-09-10 NOTE — Progress Notes (Signed)
Was in bed much of thie shift. Pleasant on contact, spontaneous smiles. No delusion expressed. Stated she has a contact in her L eye that has been there 2 weeks. Advised to remove it. 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 Will have someone bring her glasses to her soon. Both eyes are reddened. ADL's are poor. Pt has crusted white debris on her face and in her hair. Remains on routine obs for safety.

## 2017-09-10 NOTE — Progress Notes (Signed)
Patient stayed in the milieu, alert and oriented. Denying thoughts of self harm. Denying hallucinations. Was visible in the dayroom where she was friendly and supportive to peers. Attended group. Had medications and snack and did not have any concern. Was encouraged to talk to staff as needed. Patient's safety maintained.

## 2017-09-10 NOTE — Plan of Care (Signed)
Problem: Coping: Goal: Ability to cope will improve Outcome: Progressing Visible in the milieu, talking to staff and peers

## 2017-09-10 NOTE — [facility] Group Notes (Signed)
[facility] Group Notes:  (Nursing/MHT/Case Management/Adjunct)  Date:  09/10/2017  Time:  11:13 PM  Type of Therapy:  Psychoeducational Skills  Participation Level:  Active  Participation Quality:  Appropriate, Attentive and Sharing  Affect:  Appropriate  Cognitive:  Appropriate  Insight:  Appropriate and Good  Engagement in Group:  Engaged  Modes of Intervention:  Discussion, Socialization and Support  Summary of Progress/Problems:  Autumn [name] 09/10/2017, 11:13 PM

## 2017-09-11 ENCOUNTER — Inpatient Hospital Stay: Payer: BLUE CROSS/BLUE SHIELD

## 2017-09-11 NOTE — [facility] Group Notes (Signed)
09/11/2017 1:15pm  Type of Therapy/Topic:  Group Therapy:  Balance in Life  Participation Level:  Active  Description of Group:    This group will address the concept of balance and how it feels and looks when one is unbalanced. Patients will be encouraged to process areas in their lives that are out of balance and identify reasons for remaining unbalanced. Facilitators will guide patients in utilizing problem-solving interventions to address and correct the stressor making their life unbalanced. Understanding and applying boundaries will be explored and addressed for obtaining and maintaining a balanced life. Patients will be encouraged to explore ways to assertively make their unbalanced needs known to significant others in their lives, using other group members and facilitator for support and feedback.  Therapeutic Goals: 1. Patient will identify two or more emotions or situations they have that consume much of in their lives. 2. Patient will identify signs/triggers that life has become out of balance:  3. Patient will identify two ways to set boundaries in order to achieve balance in their lives:  4. Patient will demonstrate ability to communicate their needs through discussion and/or role plays  Summary of Patient Progress: Pt was present for the duration of the group but did not participate.     Therapeutic Modalities:   Cognitive Behavioral Therapy Solution-Focused Therapy Assertiveness Training  Sahid Borba  [place], LCSW-A 09/11/2017 2:52 PM

## 2017-09-11 NOTE — Progress Notes (Signed)
[facility] MD Progress Note  09/11/2017 8:59 PM Autumn [name]  MRN:  [id number]  Subjective:  Autumn [name] feels much better today. Her boyfriend and her mother visited last night. They could see progress. The mother remembered that the patient was diagnosed with pituitary microadenoma several years ago and did not follow up.  Treatment plan. We will continue all her medications lithium, depakote, abilify, zyprexa. We ordered brain MRI. There is no tumor progression.  Social/disposition. She will be discharged with the boyfriend. She will follow up with [facility].  Principal Problem: Bipolar I disorder, most recent episode manic, severe with psychotic features ([place]) Diagnosis:   Patient Active Problem List   Diagnosis Date Noted  . Bipolar I disorder, most recent episode manic, severe with psychotic features ([place]) [F31.2] 07/05/2017  . Tobacco use disorder [F17.200] 06/27/2017  . 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 Opioid use disorder, severe, dependence ([place]) [F11.20] 06/27/2017  . Cannabis use disorder, severe, dependence ([place]) [F12.20] 06/27/2017  . Hx of glaucoma [Z86.69] 10/02/2013   Total Time spent with patient: 20 minutes  Past Psychiatric History: schizoaffective disorder.  Past Medical History:  Past Medical History:  Diagnosis Date  . Allergy    Seasonal, Ultram (seizures)  . Anemia 2005  . Bipolar 1 disorder ([place])   . Glaucoma   . Seizures ([place]) 2008    Past Surgical History:  Procedure Laterality Date  . BREAST BIOPSY Right    x 2  . CESAREAN SECTION     x 2   Family History:  Family History  Problem Relation Age of Onset  . Hypertension Mother   . Hyperlipidemia Mother   . Parkinson's disease Father    Family Psychiatric  History: None Social History:  Social History   Substance and Sexual Activity  Alcohol Use Yes  . Alcohol/week: 2.4 oz  . Types: 4 Cans of beer per week   Comment: Ocassional     Social History   Substance  and Sexual Activity  Drug Use Yes  . Types: Marijuana   Comment: percocet -last use over 1 week ago    Social History   Socioeconomic History  . Marital status: Single    Spouse name: None  . Number of children: None  . Years of education: None  . Highest education level: None  Social Needs  . Financial resource strain: None  . Food insecurity - worry: None  . Food insecurity - inability: None  . Transportation needs - medical: None  . Transportation needs - non-medical: None  Occupational History  . None  Tobacco Use  . Smoking status: Current Every Day Smoker    Packs/day: 1.00    Years: 9.00    Pack years: 9.00  . Smokeless tobacco: Never Used  . Tobacco comment: Patient not interested  Substance and Sexual Activity  . Alcohol use: Yes    Alcohol/week: 2.4 oz    Types: 4 Cans of beer per week    Comment: Ocassional  . Drug use: Yes    Types: Marijuana    Comment: percocet -last use over 1 week ago  . Sexual activity: No    Birth control/protection: None  Other Topics Concern  . None  Social History Narrative  . None   Additional Social History:                         Sleep: Fair  Appetite:  Fair  Current Medications: Current  Facility-Administered Medications  Medication Dose Route Frequency Provider Last Rate Last Dose  . acetaminophen (TYLENOL) tablet 650 mg  650 mg Oral Q6H PRN Clapacs, [name], MD   650 mg at 09/02/17 8657  . alum & mag hydroxide-simeth (MAALOX/MYLANTA) 200-200-20 MG/5ML suspension 30 mL  30 mL Oral Q4H PRN Clapacs, [name], MD      . ARIPiprazole (ABILIFY) tablet 30 mg  30 mg Oral Daily Clapacs, [name], MD   30 mg at 09/11/17 0750  . divalproex (DEPAKOTE) DR tablet 500 mg  500 mg Oral Q8H [name], [name], MD   500 mg at 09/11/17 1401  . feeding supplement (ENSURE ENLIVE) (ENSURE ENLIVE) liquid 237 mL  237 mL Oral TID BM [name], [name], MD   237 mL at 09/11/17 1407  . hydrOXYzine (ATARAX/VISTARIL) tablet 50 mg  50  mg Oral Q6H PRN [name], MD   50 mg at 09/05/17 2156  . ibuprofen (ADVIL,MOTRIN) tablet 600 mg  600 mg Oral Q6H PRN Clapacs, [name], MD   600 mg at 09/04/17 2109  . latanoprost (XALATAN) 0.005 % ophthalmic solution 1 drop  1 drop Both Eyes QHS Clapacs, [name], MD   1 drop at 09/10/17 2153  . lithium carbonate capsule 600 mg  600 mg Oral BID WC Clapacs, [name], MD   600 mg at 09/11/17 1601  . magnesium hydroxide (MILK OF MAGNESIA) suspension 30 mL  30 mL Oral Daily PRN Clapacs, [name], MD   30 mL at 09/11/17 1401  . naphazoline-glycerin (CLEAR EYES) ophth solution 1-2 drop  1-2 drop Both Eyes BID PRN [name], [name], MD   2 drop at 09/08/17 1739  . nicotine (NICODERM CQ - dosed in mg/24 hours) patch 21 mg  21 mg Transdermal Daily Clapacs, [name], MD   21 mg at 09/11/17 0752  . OLANZapine (ZYPREXA) tablet 30 mg  30 mg Oral QHS Clapacs, [name], MD   30 mg at 09/10/17 2153  . QUEtiapine (SEROQUEL) tablet 200 mg  200 mg Oral QHS PRN Clapacs, [name], MD   200 mg at 09/06/17 2220  . traZODone (DESYREL) tablet 100 mg  100 mg Oral QHS [name], MD   100 mg at 09/10/17 2153  . venlafaxine XR (EFFEXOR-XR) 24 hr capsule 150 mg  150 mg Oral Q breakfast [name], [name], MD   150 mg at 09/11/17 0750    Lab Results: No results found for this or any previous visit (from the past 48 hour(s)).  Blood Alcohol level:  Lab Results  Component Value Date   ETH <10 08/31/2017   ETH <5 84/69/6295    Metabolic Disorder Labs: Lab Results  Component Value Date   HGBA1C 4.8 09/02/2017   MPG 91.06 09/02/2017   MPG 96.8 06/27/2017   No results found for: PROLACTIN Lab Results  Component Value Date   CHOL 165 09/02/2017   TRIG 97 09/02/2017   HDL 72 09/02/2017   CHOLHDL 2.3 09/02/2017   VLDL 19 09/02/2017   LDLCALC 74 09/02/2017   LDLCALC 79 06/27/2017    Physical Findings: AIMS: Facial and Oral Movements Muscles of Facial Expression: None, normal Lips and Perioral Area: None,  normal Jaw: None, normal Tongue: None, normal,Extremity Movements Upper (arms, wrists, hands, fingers): None, normal Lower (legs, knees, ankles, toes): None, normal, Trunk Movements Neck, shoulders, hips: None, normal, Overall Severity Severity of abnormal movements (highest score from questions above): None, normal Incapacitation due to abnormal movements: None,  normal Patient's awareness of abnormal movements (rate only patient's report): No Awareness, Dental Status Current problems with teeth and/or dentures?: No Does patient usually wear dentures?: No  CIWA:    COWS:     Musculoskeletal: Strength & Muscle Tone: within normal limits Gait & Station: normal Patient leans: N/A  Psychiatric Specialty Exam: Physical Exam  Nursing note and vitals reviewed. Psychiatric: Her speech is normal and behavior is normal. Her mood appears anxious. Thought content is paranoid and delusional. Cognition and memory are normal. She expresses impulsivity.    Review of Systems  Neurological: Positive for headaches.  Psychiatric/Behavioral: Negative.   All other systems reviewed and are negative.   Blood pressure 103/75, pulse 67, temperature 98 F (36.7 C), temperature source Oral, resp. rate 18, height 5\' 2"  (1.575 m), weight 48.5 kg (107 lb), SpO2 99 %.Body mass index is 19.57 kg/m.  General Appearance: Casual  Eye Contact:  Good  Speech:  Clear and Coherent  Volume:  Normal  Mood:  Euthymic  Affect:  Appropriate  Thought Process:  Goal Directed and Descriptions of Associations: Intact  Orientation:  Full (Time, Place, and Person)  Thought Content:  Delusions and Paranoid Ideation  Suicidal Thoughts:  No  Homicidal Thoughts:  No  Memory:  Immediate;   Fair Recent;   Fair Remote;   Fair  Judgement:  Poor  Insight:  Lacking  Psychomotor Activity:  Normal  Concentration:  Concentration: Fair and Attention Span: Fair  Recall:  [employer] of Knowledge:  Fair  Language:  Fair   Akathisia:  No  Handed:  Right  AIMS (if indicated):     Assets:  Communication Skills Desire for Improvement Financial Resources/Insurance Housing Intimacy Physical Health Resilience Social Support  ADL's:  Intact  Cognition:  WNL  Sleep:  Number of Hours: 7.3     Treatment Plan Summary: Daily contact with patient to assess and evaluate symptoms and progress in treatment and Medication management   Autumn [name] is a 41 year old female with a history of bipolar disorder admitted for a psychotic break. She is still paranoid, delusional and hallucinating.   # Mood and psychosis - Depakote 500 mg TID, level on 10/30 78 - Lithium 600 mg BID, level on 10/30 0.63 - Zyprexa 30 mg nightly - Abilify 30 mg nightly - Continue Effexor XR 150 mg daily -watch for symptoms of mania  # Insomnia - Seroquel 200 mg nightly -Trazodone 100 mg nightly  # Smoking - nicotine patch is available  # Metabolic syndrome monitoring - labs completed on 06/27/2017 - EKG QTc 466  # Anxiety - Vistaril 25 mg TID  # Poor oral intake - Ensure  # Substance abuse -not sure if this is a problem now, there were no symptoms of alcohol withdrawal  #Pituitary microadenoma -MRI shows no tumor progression  #Disposition - discharge with the boyfriend - follow up with [facility] for medication management    Orson Slick, MD 09/11/2017, 8:59 PM

## 2017-09-11 NOTE — Progress Notes (Signed)
Patient is alert and oriented to self and place. Denies SI/HI/AVH, present in the milieu. Observed interacting appropriately with peers, attends groups and actively participates. Compliant with meals and medications. Has the ability to verbalize her needs to staff and maintains eye contact when communicating with staff.

## 2017-09-11 NOTE — Plan of Care (Signed)
  Progressing Safety: Ability to remain free from injury will improve 09/11/2017 1116 - Progressing by Tecla Mailloux, Elzie Rings, RN Education: Mental status will improve 09/11/2017 1116 - Progressing by Jakyle Petrucelli L, RN Coping: Ability to verbalize frustrations and anger appropriately will improve 09/11/2017 1116 - Progressing by Lynlee Stratton L, RN Education: Will be free of psychotic symptoms 09/11/2017 1116 - Progressing by Rey Fors L, RN Knowledge of the prescribed therapeutic regimen will improve 09/11/2017 1116 - Progressing by Pharaoh Pio L, RN Coping: Ability to cope will improve 09/11/2017 1116 - Progressing by Makayleigh Poliquin L, RN Ability to verbalize feelings will improve 09/11/2017 1116 - Progressing by Bre Pecina, Elzie Rings, RN

## 2017-09-12 MED ORDER — ARIPIPRAZOLE 30 MG PO TABS: 30.0000 mg | ORAL_TABLET | Freq: Every day | ORAL | 1 refills | Status: DC

## 2017-09-12 MED ORDER — DIVALPROEX SODIUM 250 MG PO DR TAB: 750.0000 mg | DELAYED_RELEASE_TABLET | Freq: Two times a day (BID) | ORAL | 1 refills | Status: DC

## 2017-09-12 MED ORDER — VENLAFAXINE HCL ER 150 MG PO CP24: 150.0000 mg | ORAL_CAPSULE | Freq: Every day | ORAL | 1 refills | Status: DC

## 2017-09-12 MED ORDER — LITHIUM CARBONATE ER 300 MG PO TBCR: 600.0000 mg | EXTENDED_RELEASE_TABLET | Freq: Two times a day (BID) | ORAL | 1 refills | Status: DC

## 2017-09-12 MED ORDER — LATANOPROST 0.005 % OP SOLN: 1.0000 [drp] | Freq: Every day | OPHTHALMIC | 12 refills | Status: DC

## 2017-09-12 MED ORDER — QUETIAPINE FUMARATE 200 MG PO TABS: 200.0000 mg | ORAL_TABLET | Freq: Every evening | ORAL | 1 refills | Status: DC | PRN

## 2017-09-12 MED ORDER — OLANZAPINE 15 MG PO TABS: 30.0000 mg | ORAL_TABLET | Freq: Every day | ORAL | 1 refills | Status: DC

## 2017-09-12 NOTE — Progress Notes (Addendum)
Recreation Therapy Notes  Date: 11.05.18  Time: 3:00pm  Location: Craft room  Behavioral response: N/A  Group Type: Art/Craft,Game  Participation level: N/A  Communication: Patient did not attend group.  Comments: N/A  Jaimy Kliethermes LRT/CTRS        Shayden Gingrich 09/12/2017 4:16 PM

## 2017-09-12 NOTE — Tx Team (Signed)
Interdisciplinary Treatment and Diagnostic Plan Update  09/12/2017 Time of Session: 10:30am Charmagne Buhl MRN: [id number]  Principal Diagnosis: Bipolar I disorder, most recent episode manic, severe with psychotic features ([place])  Secondary Diagnoses: Principal Problem:   Bipolar I disorder, most recent episode manic, severe with psychotic features ([place]) Active Problems:   Hx of glaucoma   Tobacco use disorder   Cannabis use disorder, severe, dependence ([place])   Current Medications:  Current Facility-Administered Medications  Medication Dose Route Frequency Provider Last Rate Last Dose  . acetaminophen (TYLENOL) tablet 650 mg  650 mg Oral Q6H PRN Clapacs, [name], MD   650 mg at 09/02/17 6734  . alum & mag hydroxide-simeth (MAALOX/MYLANTA) 200-200-20 MG/5ML suspension 30 mL  30 mL Oral Q4H PRN Clapacs, [name], MD      . ARIPiprazole (ABILIFY) tablet 30 mg  30 mg Oral Daily Clapacs, [name], MD   30 mg at 09/12/17 0827  . divalproex (DEPAKOTE) DR tablet 500 mg  500 mg Oral Q8H [name], [name], MD   500 mg at 09/12/17 0642  . feeding supplement (ENSURE ENLIVE) (ENSURE ENLIVE) liquid 237 mL  237 mL Oral TID BM [name], [name], MD   237 mL at 09/12/17 1117  . hydrOXYzine (ATARAX/VISTARIL) tablet 50 mg  50 mg Oral Q6H PRN [name], MD   50 mg at 09/05/17 2156  . ibuprofen (ADVIL,MOTRIN) tablet 600 mg  600 mg Oral Q6H PRN Clapacs, [name], MD   600 mg at 09/04/17 2109  . latanoprost (XALATAN) 0.005 % ophthalmic solution 1 drop  1 drop Both Eyes QHS Clapacs, [name], MD   1 drop at 09/11/17 2205  . lithium carbonate capsule 600 mg  600 mg Oral BID WC Clapacs, [name], MD   600 mg at 09/12/17 0827  . magnesium hydroxide (MILK OF MAGNESIA) suspension 30 mL  30 mL Oral Daily PRN Clapacs, [name], MD   30 mL at 09/11/17 1401  . naphazoline-glycerin (CLEAR EYES) ophth solution 1-2 drop  1-2 drop Both Eyes BID PRN [name], [name], MD   2 drop at 09/08/17 1739  . nicotine (NICODERM CQ  - dosed in mg/24 hours) patch 21 mg  21 mg Transdermal Daily Clapacs, [name], MD   21 mg at 09/12/17 0827  . OLANZapine (ZYPREXA) tablet 30 mg  30 mg Oral QHS Clapacs, [name], MD   30 mg at 09/11/17 2205  . QUEtiapine (SEROQUEL) tablet 200 mg  200 mg Oral QHS PRN Clapacs, [name], MD   200 mg at 09/06/17 2220  . traZODone (DESYREL) tablet 100 mg  100 mg Oral QHS [name], MD   100 mg at 09/11/17 2206  . venlafaxine XR (EFFEXOR-XR) 24 hr capsule 150 mg  150 mg Oral Q breakfast [name], [name], MD   150 mg at 09/12/17 0827   PTA Medications: Medications Prior to Admission  Medication Sig Dispense Refill Last Dose  . ARIPiprazole (ABILIFY) 30 MG tablet Take 1 tablet (30 mg total) by mouth daily. (Patient not taking: Reported on 09/01/2017) 30 tablet 1 Not Taking at unknown  . carbamazepine (TEGRETOL) 200 MG tablet Take 200 mg by mouth 2 (two) times daily.   unknown at unknown  . citalopram (CELEXA) 20 MG tablet Take 20 mg by mouth daily.   unknown at unknown  . divalproex (DEPAKOTE) 250 MG DR tablet Take 3 tablets (750 mg total) by mouth every 12 (twelve) hours. (Patient not taking: Reported on  09/01/2017) 180 tablet 1 Not Taking at unknown  . latanoprost (XALATAN) 0.005 % ophthalmic solution Place 1 drop into both eyes at bedtime. (Patient not taking: Reported on 09/01/2017) 2.5 mL 12 Not Taking at unknown  . lithium carbonate (LITHOBID) 300 MG CR tablet Take 2 tablets (600 mg total) by mouth every 12 (twelve) hours. (Patient taking differently: Take 300 mg by mouth 2 (two) times daily. ) 120 tablet 1 unknown at unknown  . OLANZapine (ZYPREXA) 15 MG tablet Take 2 tablets (30 mg total) by mouth at bedtime. (Patient not taking: Reported on 09/01/2017) 60 tablet 1 Not Taking at unknown  . QUEtiapine (SEROQUEL) 200 MG tablet Take 1 tablet (200 mg total) by mouth at bedtime as needed (sleep). (Patient not taking: Reported on 09/01/2017) 30 tablet 1 Not Taking at Unknown time  . traZODone  (DESYREL) 150 MG tablet Take 150 mg by mouth at bedtime.   unknown at unknown    Patient Stressors: Medication change or noncompliance Occupational concerns Substance abuse  Patient Strengths: Average or above average intelligence Capable of independent living  Treatment Modalities: Medication Management, Group therapy, Case management,  1 to 1 session with clinician, Psychoeducation, Recreational therapy.   Physician Treatment Plan for Primary Diagnosis: Bipolar I disorder, most recent episode manic, severe with psychotic features ([place]) Long Term Goal(s): Improvement in symptoms so as ready for discharge Improvement in symptoms so as ready for discharge   Short Term Goals: Ability to identify changes in lifestyle to reduce recurrence of condition will improve Ability to verbalize feelings will improve Ability to disclose and discuss suicidal ideas Ability to demonstrate self-control will improve Ability to identify and develop effective coping behaviors will improve Compliance with prescribed medications will improve Ability to identify triggers associated with substance abuse/mental health issues will improve Ability to identify changes in lifestyle to reduce recurrence of condition will improve Ability to demonstrate self-control will improve Ability to identify triggers associated with substance abuse/mental health issues will improve  Medication Management: Evaluate patient's response, side effects, and tolerance of medication regimen.  Therapeutic Interventions: 1 to 1 sessions, Unit Group sessions and Medication administration.  Evaluation of Outcomes:Progressing  Physician Treatment Plan for Secondary Diagnosis: Principal Problem:   Bipolar I disorder, most recent episode manic, severe with psychotic features ([place]) Active Problems:   Hx of glaucoma   Tobacco use disorder   Cannabis use disorder, severe, dependence ([place])  Long Term Goal(s): Improvement in symptoms so  as ready for discharge Improvement in symptoms so as ready for discharge   Short Term Goals: Ability to identify changes in lifestyle to reduce recurrence of condition will improve Ability to verbalize feelings will improve Ability to disclose and discuss suicidal ideas Ability to demonstrate self-control will improve Ability to identify and develop effective coping behaviors will improve Compliance with prescribed medications will improve Ability to identify triggers associated with substance abuse/mental health issues will improve Ability to identify changes in lifestyle to reduce recurrence of condition will improve Ability to demonstrate self-control will improve Ability to identify triggers associated with substance abuse/mental health issues will improve     Medication Management: Evaluate patient's response, side effects, and tolerance of medication regimen.  Therapeutic Interventions: 1 to 1 sessions, Unit Group sessions and Medication administration.  Evaluation of Outcomes: Progressing   RN Treatment Plan for Primary Diagnosis: Bipolar I disorder, most recent episode manic, severe with psychotic features ([place]) Long Term Goal(s): Knowledge of disease and therapeutic regimen to maintain health will improve  Short  Term Goals: Ability to identify and develop effective coping behaviors will improve and Compliance with prescribed medications will improve  Medication Management: RN will administer medications as ordered by provider, will assess and evaluate patient's response and provide education to patient for prescribed medication. RN will report any adverse and/or side effects to prescribing provider.  Therapeutic Interventions: 1 on 1 counseling sessions, Psychoeducation, Medication administration, Evaluate responses to treatment, Monitor vital signs and CBGs as ordered, Perform/monitor CIWA, COWS, AIMS and Fall Risk screenings as ordered, Perform wound care treatments as  ordered.  Evaluation of Outcomes: Progressing   LCSW Treatment Plan for Primary Diagnosis: Bipolar I disorder, most recent episode manic, severe with psychotic features ([place]) Long Term Goal(s): Safe transition to appropriate next level of care at discharge, Engage patient in therapeutic group addressing interpersonal concerns.  Short Term Goals: Engage patient in aftercare planning with referrals and resources, Increase social support and Increase skills for wellness and recovery  Therapeutic Interventions: Assess for all discharge needs, 1 to 1 time with Social worker, Explore available resources and support systems, Assess for adequacy in community support network, Educate family and significant other(s) on suicide prevention, Complete Psychosocial Assessment, Interpersonal group therapy.  Evaluation of Outcomes: Progressing   Progress in Treatment: Attending groups: Yes. Participating in groups: Yes. Taking medication as prescribed: Yes. Toleration medication: Yes. Family/Significant other contact made: No, will contact:  when given permission Patient understands diagnosis: Yes. Discussing patient identified problems/goals with staff: Yes. Medical problems stabilized or resolved: Yes. Denies suicidal/homicidal ideation: Yes. Issues/concerns per patient self-inventory: No. Other: none  New problem(s) identified: No, Describe:  none  New Short Term/Long Term Goal(s):Pt goal: to act my age, not act like I'm twently.  Discharge Plan or Barriers: Pt will continue with [facility] for outpt services. Housing TBD  Reason for Continuation of Hospitalization: Medication stabilization Other; describe bizarre behavior, Depression, disorganized  Estimated Length of Stay: 2-3 days.  Attendees: Patient: 09/07/2017   Physician: Dr. Bary Leriche, MD 09/07/2017   Nursing: Polly Cobia, RN 09/07/2017   RN Care Manager:   Social Worker:Chloeann Alfred, LCSW 09/07/2017   Recreational Therapist: Roanna Epley, LRT 09/07/2017   Other:    Other:    Other:        Scribe for Treatment Team: August Saucer, LCSW 09/12/2017 11:38 AM

## 2017-09-12 NOTE — Progress Notes (Signed)
Patient maintained a positive attitude in the milieu and attended evening activities. Went to bed early reporting that she was tired. Denied thoughts of self harm. Denied hallucinations. Was encouraged to call staff as needed. Safety precautions maintained.

## 2017-09-12 NOTE — Progress Notes (Signed)
Recreation Therapy Notes  Date: 11.05.18  Time: 9:30 am  Location: Craft Room  Behavioral response: N/A  Intervention Topic: Problem solving   Discussion/Intervention: Patient did not attend group.  Clinical Observations/Feedback:  Patient did not attend group.  Mazen Marcin LRT/CTRS         Merri Dimaano 09/12/2017 10:32 AM

## 2017-09-12 NOTE — Plan of Care (Signed)
Patient attending groups and states "the medicines working for me."

## 2017-09-12 NOTE — Plan of Care (Signed)
Reports that her mood in improving "my medications are working...". No sign of distress.

## 2017-09-12 NOTE — Progress Notes (Signed)
[facility] MD Progress Note  09/12/2017 1:30 PM Autumn [name]  MRN:  [id number]  Subjective:  [name] is cool and collected. She denies any problems, takes medications and participated in programming. She is close to baseline. No side effects reported.  Treatment plan. We continue current regimen. The main problem is medication noncompliance in the community but the patient refuses injectable antipsychotics.  Social/disposition. She will return to the boarding house with her boyfriend. Follow up with [facility].  Principal Problem: Bipolar I disorder, most recent episode manic, severe with psychotic features ([place]) Diagnosis:   Patient Active Problem List   Diagnosis Date Noted  . Bipolar I disorder, most recent episode manic, severe with psychotic features ([place]) [F31.2] 07/05/2017  . Tobacco use disorder [F17.200] 06/27/2017  . 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 Opioid use disorder, severe, dependence ([place]) [F11.20] 06/27/2017  . Cannabis use disorder, severe, dependence ([place]) [F12.20] 06/27/2017  . Hx of glaucoma [Z86.69] 10/02/2013   Total Time spent with patient: 20 minutes  Past Psychiatric History: schizoaffective disorder.  Past Medical History:  Past Medical History:  Diagnosis Date  . Allergy    Seasonal, Ultram (seizures)  . Anemia 2005  . Bipolar 1 disorder ([name])   . Glaucoma   . Seizures ([name]) 2008    Past Surgical History:  Procedure Laterality Date  . BREAST BIOPSY Right    x 2  . CESAREAN SECTION     x 2   Family History:  Family History  Problem Relation Age of Onset  . Hypertension Mother   . Hyperlipidemia Mother   . Parkinson's disease Father    Family Psychiatric  History: none reported  Social History:  Social History   Substance and Sexual Activity  Alcohol Use Yes  . Alcohol/week: 2.4 oz  . Types: 4 Cans of beer per week   Comment: Ocassional     Social History   Substance and Sexual Activity  Drug Use Yes  .  Types: Marijuana   Comment: percocet -last use over 1 week ago    Social History   Socioeconomic History  . Marital status: Single    Spouse name: None  . Number of children: None  . Years of education: None  . Highest education level: None  Social Needs  . Financial resource strain: None  . Food insecurity - worry: None  . Food insecurity - inability: None  . Transportation needs - medical: None  . Transportation needs - non-medical: None  Occupational History  . None  Tobacco Use  . Smoking status: Current Every Day Smoker    Packs/day: 1.00    Years: 9.00    Pack years: 9.00  . Smokeless tobacco: Never Used  . Tobacco comment: Patient not interested  Substance and Sexual Activity  . Alcohol use: Yes    Alcohol/week: 2.4 oz    Types: 4 Cans of beer per week    Comment: Ocassional  . Drug use: Yes    Types: Marijuana    Comment: percocet -last use over 1 week ago  . Sexual activity: No    Birth control/protection: None  Other Topics Concern  . None  Social History Narrative  . None   Additional Social History:                         Sleep: Fair  Appetite:  Fair  Current Medications: Current Facility-Administered Medications  Medication Dose Route Frequency Provider Last  Rate Last Dose  . acetaminophen (TYLENOL) tablet 650 mg  650 mg Oral Q6H PRN Clapacs, [name], MD   650 mg at 09/02/17 7494  . alum & mag hydroxide-simeth (MAALOX/MYLANTA) 200-200-20 MG/5ML suspension 30 mL  30 mL Oral Q4H PRN Clapacs, [name], MD      . ARIPiprazole (ABILIFY) tablet 30 mg  30 mg Oral Daily Clapacs, [name], MD   30 mg at 09/12/17 0827  . divalproex (DEPAKOTE) DR tablet 500 mg  500 mg Oral Q8H Klinton Candelas B, MD   500 mg at 09/12/17 0642  . feeding supplement (ENSURE ENLIVE) (ENSURE ENLIVE) liquid 237 mL  237 mL Oral TID BM [name] B, MD   237 mL at 09/12/17 1117  . hydrOXYzine (ATARAX/VISTARIL) tablet 50 mg  50 mg Oral Q6H PRN [name], MD    50 mg at 09/05/17 2156  . ibuprofen (ADVIL,MOTRIN) tablet 600 mg  600 mg Oral Q6H PRN Clapacs, [name], MD   600 mg at 09/04/17 2109  . latanoprost (XALATAN) 0.005 % ophthalmic solution 1 drop  1 drop Both Eyes QHS Clapacs, [name], MD   1 drop at 09/11/17 2205  . lithium carbonate capsule 600 mg  600 mg Oral BID WC Clapacs, [name], MD   600 mg at 09/12/17 0827  . magnesium hydroxide (MILK OF MAGNESIA) suspension 30 mL  30 mL Oral Daily PRN Clapacs, [name], MD   30 mL at 09/11/17 1401  . naphazoline-glycerin (CLEAR EYES) ophth solution 1-2 drop  1-2 drop Both Eyes BID PRN [name], [name], MD   2 drop at 09/08/17 1739  . nicotine (NICODERM CQ - dosed in mg/24 hours) patch 21 mg  21 mg Transdermal Daily Clapacs, [name], MD   21 mg at 09/12/17 0827  . OLANZapine (ZYPREXA) tablet 30 mg  30 mg Oral QHS Clapacs, [name], MD   30 mg at 09/11/17 2205  . QUEtiapine (SEROQUEL) tablet 200 mg  200 mg Oral QHS PRN Clapacs, [name], MD   200 mg at 09/06/17 2220  . traZODone (DESYREL) tablet 100 mg  100 mg Oral QHS [name], MD   100 mg at 09/11/17 2206  . venlafaxine XR (EFFEXOR-XR) 24 hr capsule 150 mg  150 mg Oral Q breakfast [name] B, MD   150 mg at 09/12/17 0827    Lab Results: No results found for this or any previous visit (from the past 48 hour(s)).  Blood Alcohol level:  Lab Results  Component Value Date   ETH <10 08/31/2017   ETH <5 49/67/5916    Metabolic Disorder Labs: Lab Results  Component Value Date   HGBA1C 4.8 09/02/2017   MPG 91.06 09/02/2017   MPG 96.8 06/27/2017   No results found for: PROLACTIN Lab Results  Component Value Date   CHOL 165 09/02/2017   TRIG 97 09/02/2017   HDL 72 09/02/2017   CHOLHDL 2.3 09/02/2017   VLDL 19 09/02/2017   LDLCALC 74 09/02/2017   LDLCALC 79 06/27/2017    Physical Findings: AIMS: Facial and Oral Movements Muscles of Facial Expression: None, normal Lips and Perioral Area: None, normal Jaw: None, normal Tongue: None,  normal,Extremity Movements Upper (arms, wrists, hands, fingers): None, normal Lower (legs, knees, ankles, toes): None, normal, Trunk Movements Neck, shoulders, hips: None, normal, Overall Severity Severity of abnormal movements (highest score from questions above): None, normal Incapacitation due to abnormal movements: None, normal Patient's awareness of abnormal movements (rate only patient's  report): No Awareness, Dental Status Current problems with teeth and/or dentures?: No Does patient usually wear dentures?: No  CIWA:    COWS:     Musculoskeletal: Strength & Muscle Tone: within normal limits Gait & Station: normal Patient leans: N/A  Psychiatric Specialty Exam: Physical Exam  ROS  Blood pressure 102/63, pulse 67, temperature 98.2 F (36.8 C), temperature source Oral, resp. rate 18, height 5\' 2"  (1.575 m), weight 48.5 kg (107 lb), SpO2 99 %.Body mass index is 19.57 kg/m.  General Appearance: Casual  Eye Contact:  Good  Speech:  Clear and Coherent  Volume:  Normal  Mood:  Euthymic  Affect:  Appropriate  Thought Process:  Goal Directed and Descriptions of Associations: Intact  Orientation:  Full (Time, Place, and Person)  Thought Content:  WDL  Suicidal Thoughts:  No  Homicidal Thoughts:  No  Memory:  Immediate;   Fair Recent;   Fair Remote;   Fair  Judgement:  Poor  Insight:  Lacking  Psychomotor Activity:  Normal  Concentration:  Concentration: Fair and Attention Span: Fair  Recall:  [employer] of Knowledge:  Fair  Language:  Fair  Akathisia:  No  Handed:  Right  AIMS (if indicated):     Assets:  Communication Skills Desire for Improvement Financial Resources/Insurance Housing Intimacy Physical Health Resilience Social Support Vocational/Educational  ADL's:  Intact  Cognition:  WNL  Sleep:  Number of Hours: 8     Treatment Plan Summary: Daily contact with patient to assess and evaluate symptoms and progress in treatment and Medication management    Ms. Dejonge is a 41 year old female with a history of bipolar disorder admitted for a psychotic break. She is still paranoid, delusional and hallucinating.   # Mood and psychosis - Depakote 500 mg TID, level on 10/30 78 - Lithium 600 mg BID, level on 10/30 0.63 - Zyprexa 30 mg nightly - Abilify 30 mg nightly - Continue Effexor XR 150 mg daily  # Insomnia - Seroquel 200 mg nightly -Trazodone 100 mg nightly  # Smoking - nicotine patch is available  # Metabolic syndrome monitoring - labs completed on 06/27/2017 - EKG QTc 466  # Anxiety - Vistaril 25 mg TID  # Poor oral intake - Ensure  # Substance abuse -not sure if this is a problem now, there were no symptoms of alcohol withdrawal -patient is not motivated for change  #Pituitary microadenoma -MRI shows no tumor progression  #Disposition - discharge with the boyfriend - follow up with [facility] for medication management     Orson Slick, MD 09/12/2017, 1:30 PM

## 2017-09-12 NOTE — Progress Notes (Signed)
Patient denies SI,HI and AVH.Patient verbalized that she is less anxious today.Appropriate with staff & peers.Compliant with medications.Attended groups.C/o constipation but refused to take any meds at this time.Support and encouragement given.

## 2017-09-13 NOTE — Progress Notes (Signed)
Discharge Note:  Patient denies SI/HI/AVH at this time. Discharge instructions, AVS, prescriptions and transition record gone over with patient. Patient agrees to comply with medication management, follow-up visit, and outpatient therapy. Patient belongings as well as home medications stored by pharmacy returned to patient. Patient questions and concerns addressed and answered. Patient escorted off the unit with this Probation officer and discharged to home with boyfriend.

## 2017-09-13 NOTE — BHH Group Notes (Signed)
[facility] Group Notes:  (Nursing/MHT/Case Management/Adjunct)  Date:  09/13/2017  Time:  6:01 AM  Type of Therapy:  Psychoeducational Skills  Participation Level:  Active  Participation Quality:  Appropriate, Attentive and Sharing  Affect:  Appropriate  Cognitive:  Appropriate  Insight:  Appropriate and Good  Engagement in Group:  Engaged  Modes of Intervention:  Discussion, Socialization and Support  Summary of Progress/Problems:  Autumn [name] 09/13/2017, 6:01 AM

## 2017-09-13 NOTE — Plan of Care (Signed)
Visible in the milieu, pleasant, denying thoughts of self harm, motivated for treatment

## 2017-09-13 NOTE — BHH Suicide Risk Assessment (Signed)
[facility] Discharge Suicide Risk Assessment   Principal Problem: Bipolar I disorder, most recent episode manic, severe with psychotic features [facility]) Discharge Diagnoses:  Patient Active Problem List   Diagnosis Date Noted  . Bipolar I disorder, most recent episode manic, severe with psychotic features ([place]) [F31.2] 07/05/2017  . Tobacco use disorder [F17.200] 06/27/2017  . 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 Opioid use disorder, severe, dependence ([place]) [F11.20] 06/27/2017  . Cannabis use disorder, severe, dependence ([place]) [F12.20] 06/27/2017  . Hx of glaucoma [Z86.69] 10/02/2013    Total Time spent with patient: 30 minutes  Musculoskeletal: Strength & Muscle Tone: within normal limits Gait & Station: normal Patient leans: N/A  Psychiatric Specialty Exam: Review of Systems  Neurological: Negative.   Psychiatric/Behavioral: Negative.   All other systems reviewed and are negative.   Blood pressure (!) 93/58, pulse 64, temperature 97.6 F (36.4 C), temperature source Oral, resp. rate 16, height 5\' 2"  (1.575 m), weight 48.5 kg (107 lb), SpO2 99 %.Body mass index is 19.57 kg/m.  General Appearance: Casual  Eye Contact::  Good  Speech:  Clear and Coherent409  Volume:  Normal  Mood:  Euthymic  Affect:  Appropriate  Thought Process:  Goal Directed and Descriptions of Associations: Intact  Orientation:  Full (Time, Place, and Person)  Thought Content:  WDL  Suicidal Thoughts:  No  Homicidal Thoughts:  No  Memory:  Immediate;   Fair Recent;   Fair Remote;   Fair  Judgement:  Poor  Insight:  Lacking  Psychomotor Activity:  Normal  Concentration:  Fair  Recall:  [employer] of [place]  Language: Fair  Akathisia:  No  Handed:  Right  AIMS (if indicated):     Assets:  Communication Skills Desire for Improvement Financial Resources/Insurance Housing Intimacy Physical Health Resilience Social Support Vocational/Educational  Sleep:  Number of  Hours: 7.75  Cognition: WNL  ADL's:  Intact   Mental Status Per Nursing Assessment::   On Admission:     Demographic Factors:  Caucasian and Low socioeconomic status  Loss Factors: Financial problems/change in socioeconomic status  Historical Factors: Prior suicide attempts and Impulsivity  Risk Reduction Factors:   Sense of responsibility to family, Employed, Living with another person, especially a relative and Positive social support  Continued Clinical Symptoms:  Bipolar Disorder:   Mixed State Alcohol/Substance Abuse/Dependencies  Cognitive Features That Contribute To Risk:  None    Suicide Risk:  Minimal: No identifiable suicidal ideation.  Patients presenting with no risk factors but with morbid ruminations; may be classified as minimal risk based on the severity of the depressive symptoms  Follow-up Information    [facility] on 10/20/2017.   Why:  Medications management w Dr Kasandra Knudsen on 12/13 at 8:20 AM.  This is the earliest appointment available, you are on a cancellation list if an earlier appointment becomes available.  Please call to cancel/reschedule if needed.  Contact information: [place] [address] [phone]        [facility] on 09/15/2017.   Why:  2:30pm, for Hospital Follow up  Contact information: [place] [address] [phone]           Plan Of Care/Follow-up recommendations:  Activity:  as tolerated Diet:  low sodium heart healthy Other:  keep follow up appointments  Orson Slick, MD 09/13/2017, 7:45 AM

## 2017-09-13 NOTE — Progress Notes (Signed)
Patient stayed in the milieu until bedtime. She attended group and other activities. Pleasant and cooperative. Alert and oriented and denying thoughts of self harm. Denying hallucinations. Autumn [name] is engaged in activities and compliant with treatment plan. Has not had any concern so far. Received her medications, had a snack and currently sleeping. No sign of discomfort. Therapeutic milieu promoted. Staff continue to monitor for safety.

## 2017-09-13 NOTE — Progress Notes (Addendum)
Recreation Therapy Notes  Date: 11.06.18  Time: 9:30 am   Location: Craft Room  Behavioral response: Appropriate  Intervention Topic: Anger Management  Discussion/Intervention: Group content on today was focused on anger management. The group defined anger and reasons they become angry. Individuals expressed negative way they have dealt with anger in the past. Patients stated some positive ways they could deal with anger in the future. The group described how anger can affect your health and daily plans. Individuals participated in the intervention "Score your anger" where they had a chance to answer questions about themselves and get a score of their anger.  Patient came to group and stated that when she is angry she just moves herself away from the situation. She explained that anger is negativity that she tried to stay away from. Individual left group for unknown reason but later returned. Patient was positive with peers and staff during group.    Fiore Detjen LRT/CTRS        Shynia Daleo 09/13/2017 11:14 AM

## 2017-09-13 NOTE — Progress Notes (Signed)
  [facility] Adult Case Management Discharge Plan :  Will you be returning to the same living situation after discharge:  Yes,  with boyfriend At discharge, do you have transportation home?: Yes,  boyfriend Do you have the ability to pay for your medications: Yes,  BCBS  Release of information consent forms completed and in the chart;  [name]  Patient to Follow up at: Follow-up Information    [facility] on 09/15/2017.   Why:  Please attend your hospital follow up appt on Thursday, November 8, at 2:30pm, for Hospital Follow up.  Please also attend your medication appointment with Dr. Ernie Hew on  Friday, 10/14/17, at 11:00am. Contact information: [address] [facility] [address] [phone]           Next level of care provider has access to [place] and Suicide Prevention discussed: Yes,  with mother  Have you used any form of tobacco in the last 30 days? (Cigarettes, Smokeless Tobacco, Cigars, and/or Pipes): Yes  Has patient been referred to the Quitline?: Patient refused referral  Patient has been referred for addiction treatment: Yes  Joanne Chars, [place] 09/13/2017, 10:37 AM

## 2017-09-13 NOTE — Plan of Care (Signed)
D: Patient has been free from injury and falls during this visit. Patient has been very pleasant and compliant on the unit. Patient states that she has been "feeling good for a week" now and she is very anxious and eager to be discharged today. Patient denies any SI/HI/AVH at this time and does agree to come to staff with any new concerns or if she notices any changes in her mood. Patient has a clear understanding of her medications and has no questions or concerns at this time. Patient reports that she has a good support system that she can depend on for help outside of the inpatient hospital.

## 2017-09-13 NOTE — BHH Group Notes (Signed)
LCSW Group Therapy Note 09/13/2017 9:00am  Type of Therapy and Topic:  Group Therapy:  Setting Goals  Participation Level:  Active  Description of Group: In this process group, patients discussed using strengths to work toward goals and address challenges.  Patients identified two positive things about themselves and one goal they were working on.  Patients were given the opportunity to share openly and support each other's plan for self-empowerment.  The group discussed the value of gratitude and were encouraged to have a daily reflection of positive characteristics or circumstances.  Patients were encouraged to identify a plan to utilize their strengths to work on current challenges and goals.  Therapeutic Goals 1. Patient will verbalize personal strengths/positive qualities and relate how these can assist with achieving desired personal goals 2. Patients will verbalize affirmation of peers plans for personal change and goal setting 3. Patients will explore the value of gratitude and positive focus as related to successful achievement of goals 4. Patients will verbalize a plan for regular reinforcement of personal positive qualities and circumstances.  Summary of Patient Progress:  Pt able to meet therapeutic goals, she verbalizes her goal is "to get FMLA paperwork from [employer] to her job and to discharge today:"     [employer], LCSW 09/13/2017 11:34 AM
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
## 2017-09-13 NOTE — Progress Notes (Signed)
Patient ID: Autumn [name], female   DOB: 07/01/1976, 41 y.o.   MRN: [id number] CSW cancelled Pt appt with [facility] as she has decided to continue with [facility] locally.    Dossie Arbour, LCSW

## 2018-04-11 ENCOUNTER — Emergency Department
Admission: EM | Admit: 2018-04-11 | Discharge: 2018-04-12 | Disposition: A | Discharge status: Other Acute Inpatient Hospital | Payer: BLUE CROSS/BLUE SHIELD | Attending: Emergency Medicine | Admitting: Emergency Medicine

## 2018-04-11 ENCOUNTER — Encounter: Payer: Self-pay | Admitting: Emergency Medicine

## 2018-04-11 ENCOUNTER — Other Ambulatory Visit: Payer: Self-pay

## 2018-04-11 DIAGNOSIS — F1510 Other stimulant abuse, uncomplicated: Secondary | ICD-10-CM | POA: Diagnosis present

## 2018-04-11 DIAGNOSIS — F1120 Opioid dependence, uncomplicated: Secondary | ICD-10-CM | POA: Diagnosis not present

## 2018-04-11 DIAGNOSIS — R45851 Suicidal ideations: Secondary | ICD-10-CM | POA: Diagnosis not present

## 2018-04-11 DIAGNOSIS — F17200 Nicotine dependence, unspecified, uncomplicated: Secondary | ICD-10-CM | POA: Diagnosis present

## 2018-04-11 DIAGNOSIS — Z79899 Other long term (current) drug therapy: Secondary | ICD-10-CM | POA: Diagnosis not present

## 2018-04-11 DIAGNOSIS — F319 Bipolar disorder, unspecified: Secondary | ICD-10-CM | POA: Insufficient documentation

## 2018-04-11 DIAGNOSIS — F99 Mental disorder, not otherwise specified: Secondary | ICD-10-CM | POA: Diagnosis present

## 2018-04-11 DIAGNOSIS — F1220 Cannabis dependence, uncomplicated: Secondary | ICD-10-CM | POA: Diagnosis present

## 2018-04-11 DIAGNOSIS — F1520 Other stimulant dependence, uncomplicated: Secondary | ICD-10-CM | POA: Diagnosis present

## 2018-04-11 DIAGNOSIS — F314 Bipolar disorder, current episode depressed, severe, without psychotic features: Secondary | ICD-10-CM | POA: Diagnosis present

## 2018-04-11 LAB — CBC
HEMATOCRIT: 41.4 % (ref 35.0–47.0)
Hemoglobin: 14.1 g/dL (ref 12.0–16.0)
MCH: 31.3 pg (ref 26.0–34.0)
MCHC: 34 g/dL (ref 32.0–36.0)
MCV: 92 fL (ref 80.0–100.0)
Platelets: 181 10*3/uL (ref 150–440)
RBC: 4.5 MIL/uL (ref 3.80–5.20)
RDW: 13 % (ref 11.5–14.5)
WBC: 5.8 10*3/uL (ref 3.6–11.0)

## 2018-04-11 LAB — URINE DRUG SCREEN, QUALITATIVE (ARMC ONLY)
Amphetamines, Ur Screen: POSITIVE — AB
BARBITURATES, UR SCREEN: NOT DETECTED
Benzodiazepine, Ur Scrn: NOT DETECTED
COCAINE METABOLITE, UR ~~LOC~~: NOT DETECTED
Cannabinoid 50 Ng, Ur ~~LOC~~: POSITIVE — AB
MDMA (Ecstasy)Ur Screen: NOT DETECTED
METHADONE SCREEN, URINE: NOT DETECTED
OPIATE, UR SCREEN: NOT DETECTED
PHENCYCLIDINE (PCP) UR S: NOT DETECTED
Tricyclic, Ur Screen: NOT DETECTED

## 2018-04-11 LAB — COMPREHENSIVE METABOLIC PANEL
ALBUMIN: 4.6 g/dL (ref 3.5–5.0)
ALK PHOS: 43 U/L (ref 38–126)
ALT: 9 U/L — AB (ref 14–54)
AST: 13 U/L — AB (ref 15–41)
Anion gap: 7 (ref 5–15)
BILIRUBIN TOTAL: 0.6 mg/dL (ref 0.3–1.2)
BUN: 14 mg/dL (ref 6–20)
CALCIUM: 9.1 mg/dL (ref 8.9–10.3)
CO2: 26 mmol/L (ref 22–32)
CREATININE: 0.71 mg/dL (ref 0.44–1.00)
Chloride: 105 mmol/L (ref 101–111)
GFR calc Af Amer: >60 mL/min (ref >60)
GFR calc non Af Amer: >60 mL/min (ref >60)
GLUCOSE: 76 mg/dL (ref 65–99)
Potassium: 3.3 mmol/L — ABNORMAL LOW (ref 3.5–5.1)
SODIUM: 138 mmol/L (ref 135–145)
Total Protein: 7.4 g/dL (ref 6.5–8.1)

## 2018-04-11 LAB — ACETAMINOPHEN LEVEL

## 2018-04-11 LAB — ETHANOL: Alcohol, Ethyl (B): <10 mg/dL (ref <10)

## 2018-04-11 LAB — SALICYLATE LEVEL: Salicylate Lvl: <7 mg/dL (ref 2.8–30.0)

## 2018-04-11 LAB — POCT PREGNANCY, URINE: Preg Test, Ur: NEGATIVE

## 2018-04-11 NOTE — ED Notes (Signed)
Pt. Transferred to [place] from ED to room 5 after screening for contraband. Report to include Situation, Background, Assessment and Recommendations from [place]. Pt. Oriented to unit including Q15 minute rounds as well as the security cameras for their protection. Patient is alert and oriented, warm and dry in no acute distress. Patient denies SI, HI, and AVH. Pt. Encouraged to let me know if needs arise.

## 2018-04-11 NOTE — ED Triage Notes (Signed)
Pt reports that she is having SI, she states that she has had them for the last week or so. She reports that she does not have a plan. She is calm and cooperative. She reports that she has been taking antidepressants but they are not working she did see her psychiatrist on Friday.

## 2018-04-11 NOTE — ED Notes (Signed)
Hourly rounding reveals patient sleeping in room. No complaints, stable, in no acute distress. Q15 minute rounds and monitoring via Security Cameras to continue. 
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
## 2018-04-11 NOTE — ED Notes (Signed)
"   I saw Dr Randel Books last week and he put me on Cymbalta but I have had problems with that in the past and I told him that but he did not listen and put me on it anyway - I have never had it filled bucause I am not going to take Cymbalta  - I need something different that works"

## 2018-04-11 NOTE — ED Provider Notes (Signed)
[facility] Emergency Department Provider Note  ____________________________________________   First MD Initiated Contact with Patient 04/11/18 1801     (approximate)  I have reviewed the triage vital signs and the nursing notes.   HISTORY  Chief Complaint Suicidal  HPI Autumn [name] is a 42 y.o. female presents for evaluation of suicidal thoughts  Patient reports for about the last 1 to 2 weeks she been experiencing suicidal thoughts, she has bipolar disorder and is not currently taking any medicine.  She saw her psychiatrist, they recommended she start on a new antidepressant, but she reports she is not taking it any longer.  She saw a psychiatrist in the last 1 to 2 weeks.  Does not have any specific plan to harm herself.  Reports that she continues to have thoughts of self-harm, and that she needs further evaluation tonight.  Denies any overdose or attempt at self-harm.  Denies any specific event triggering her symptoms.  Currently holds a job at [employer].  History of previous mental illness, reports previous history of bipolar and severe depression in the past.  Denies active plan or desire to hurt her self, but reports she keeps having thoughts about suicide and needs additional help.  Presents voluntarily.  Past Medical History:  Diagnosis Date  . Allergy    Seasonal, Ultram (seizures)  . Anemia 2005  . Bipolar 1 disorder ([place])   . Glaucoma   . Seizures ([place]) 2008    Patient Active Problem List   Diagnosis Date Noted  . Bipolar I disorder, most recent episode manic, severe with psychotic features ([place]) 07/05/2017  . Tobacco use disorder 06/27/2017  . 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 Opioid use disorder, severe, dependence ([place]) 06/27/2017  . Cannabis use disorder, severe, dependence ([place]) 06/27/2017  . Hx of glaucoma 10/02/2013    Past Surgical History:  Procedure Laterality Date  . BREAST BIOPSY Right     x 2  . CESAREAN SECTION     x 2    Prior to Admission medications   Medication Sig Start Date End Date Taking? Authorizing Provider  ARIPiprazole (ABILIFY) 30 MG tablet Take 1 tablet (30 mg total) daily by mouth. 09/12/17   [name], Herma Ard B, MD  divalproex (DEPAKOTE) 250 MG DR tablet Take 3 tablets (750 mg total) every 12 (twelve) hours by mouth. 09/12/17   [name], [name], MD  latanoprost (XALATAN) 0.005 % ophthalmic solution Place 1 drop at bedtime into both eyes. 09/12/17   [name], Herma Ard B, MD  lithium carbonate (LITHOBID) 300 MG CR tablet Take 2 tablets (600 mg total) every 12 (twelve) hours by mouth. 09/12/17   [name], [name], MD  OLANZapine (ZYPREXA) 15 MG tablet Take 2 tablets (30 mg total) at bedtime by mouth. 09/12/17   [name], Herma Ard B, MD  QUEtiapine (SEROQUEL) 200 MG tablet Take 1 tablet (200 mg total) at bedtime as needed by mouth (sleep). 09/12/17   [name], Herma Ard B, MD  venlafaxine XR (EFFEXOR-XR) 150 MG 24 hr capsule Take 1 capsule (150 mg total) daily with breakfast by mouth. 09/13/17   [name], Wardell Honour, MD  Currently reports patient not taking any medications including eyedrops  Allergies Haldol [haloperidol lactate] and Ultram [tramadol]  Family History  Problem Relation Age of Onset  . Hypertension Mother   . Hyperlipidemia Mother   . Parkinson's disease Father     Social History Social History   Tobacco Use  . Smoking status: Current Every Day  Smoker    Packs/day: 1.00    Years: 9.00    Pack years: 9.00  . Smokeless tobacco: Never Used  . Tobacco comment: Patient not interested  Substance Use Topics  . Alcohol use: Yes    Alcohol/week: 2.4 oz    Types: 4 Cans of beer per week    Comment: Ocassional  . Drug use: Yes    Types: Marijuana    Comment: percocet -last use over 1 week ago    Review of Systems Constitutional: No fever/chills Eyes: No visual changes. ENT: No sore throat. Cardiovascular: Denies chest  pain. Respiratory: Denies shortness of breath. Gastrointestinal: No abdominal pain.  No nausea, no vomiting.  No diarrhea.  No constipation. Genitourinary: Negative for dysuria. Musculoskeletal: Negative for back pain. Skin: Negative for rash. Neurological: Negative for headaches, focal weakness or numbness.    ____________________________________________   PHYSICAL EXAM:  VITAL SIGNS: ED Triage Vitals  Enc Vitals Group     BP 04/11/18 1641 (!) 104/55     Pulse Rate 04/11/18 1641 63     Resp 04/11/18 1641 20     Temp 04/11/18 1641 97.8 F (36.6 C)     Temp Source 04/11/18 1641 Oral     SpO2 04/11/18 1641 100 %     Weight 04/11/18 1642 102 lb (46.3 kg)     Height 04/11/18 1642 5\' 2"  (1.575 m)     Head Circumference --      Peak Flow --      Pain Score 04/11/18 1641 0     Pain Loc --      Pain Edu? --      Excl. in [place]? --     Constitutional: Alert and oriented. Well appearing and in no acute distress. Eyes: Conjunctivae are normal. Head: Atraumatic. Nose: No congestion/rhinnorhea. Mouth/Throat: Mucous membranes are moist. Neck: No stridor.   Cardiovascular: Normal rate, regular rhythm. Grossly normal heart sounds.  Good peripheral circulation. Respiratory: Normal respiratory effort.  No retractions. Lungs CTAB. Gastrointestinal: Soft and nontender. No distention. Musculoskeletal: No lower extremity tenderness nor edema. Neurologic:  Normal speech and language. No gross focal neurologic deficits are appreciated.  Skin:  Skin is warm, dry and intact. No rash noted. Psychiatric: Mood and affect are slightly flat.  Denies active suicidal plan, but does report ideations of suicide coming and going over the last week. Speech and behavior are normal.  ____________________________________________   LABS (all labs ordered are listed, but only abnormal results are displayed)  Labs Reviewed  COMPREHENSIVE METABOLIC PANEL - Abnormal; Notable for the following components:       Result Value   Potassium 3.3 (*)    AST 13 (*)    ALT 9 (*)    All other components within normal limits  ACETAMINOPHEN LEVEL - Abnormal; Notable for the following components:   Acetaminophen (Tylenol), Serum <10 (*)    All other components within normal limits  URINE DRUG SCREEN, QUALITATIVE ([facility] ONLY) - Abnormal; Notable for the following components:   Amphetamines, Ur Screen POSITIVE (*)    Cannabinoid 50 Ng, Ur [place] POSITIVE (*)    All other components within normal limits  ETHANOL  SALICYLATE LEVEL  CBC  POC URINE PREG, ED  POCT PREGNANCY, URINE   ____________________________________________  EKG   ____________________________________________  RADIOLOGY   ____________________________________________   PROCEDURES  Procedure(s) performed: None  Procedures  Critical Care performed: No  ____________________________________________   INITIAL IMPRESSION / ASSESSMENT AND PLAN / ED COURSE  Pertinent  labs & imaging results that were available during my care of the patient were reviewed by me and considered in my medical decision making (see chart for details).  Patient with a known history of bipolar disorder.  Patient presents for evaluation of suicidal thoughts.  Being evaluated by outpatient psychiatry, but has not been taking her recommended medications.  Denies acute medical concerns.  Ongoing care assigned to Dr. Owens Shark at 1215am, follow-up on psych recommendations.      ____________________________________________   FINAL CLINICAL IMPRESSION(S) / ED DIAGNOSES  Final diagnoses:  Suicidal thoughts      NEW MEDICATIONS STARTED DURING THIS VISIT:  New Prescriptions   No medications on file     Note:  This document was prepared using Dragon voice recognition software and may include unintentional dictation errors.     Delman Kitten, MD 04/12/18 [phone]

## 2018-04-11 NOTE — ED Notes (Signed)
Patient in her room, resting and watching television and in NAD

## 2018-04-11 NOTE — ED Notes (Signed)

## 2018-04-11 NOTE — ED Notes (Signed)
Pt changed into appropriate behavioral health clothing. Pt belongings consist of pink shoes, gray socks, pink capris, a green shirt, a pink hair bow, purple panties and a blue bra.

## 2018-04-12 ENCOUNTER — Inpatient Hospital Stay
Admission: AD | Admit: 2018-04-12 | Discharge: 2018-04-21 | DRG: 885 | Disposition: A | Discharge status: Home / Self Care | Payer: BLUE CROSS/BLUE SHIELD | Source: Intra-hospital | Attending: Psychiatry | Admitting: Psychiatry

## 2018-04-12 ENCOUNTER — Encounter: Payer: Self-pay | Admitting: Psychiatry

## 2018-04-12 DIAGNOSIS — Z79899 Other long term (current) drug therapy: Secondary | ICD-10-CM | POA: Diagnosis not present

## 2018-04-12 DIAGNOSIS — H409 Unspecified glaucoma: Secondary | ICD-10-CM | POA: Diagnosis present

## 2018-04-12 DIAGNOSIS — F314 Bipolar disorder, current episode depressed, severe, without psychotic features: Secondary | ICD-10-CM | POA: Diagnosis present

## 2018-04-12 DIAGNOSIS — Z3202 Encounter for pregnancy test, result negative: Secondary | ICD-10-CM | POA: Diagnosis present

## 2018-04-12 DIAGNOSIS — F315 Bipolar disorder, current episode depressed, severe, with psychotic features: Secondary | ICD-10-CM | POA: Diagnosis present

## 2018-04-12 DIAGNOSIS — F17210 Nicotine dependence, cigarettes, uncomplicated: Secondary | ICD-10-CM | POA: Diagnosis present

## 2018-04-12 DIAGNOSIS — F1220 Cannabis dependence, uncomplicated: Secondary | ICD-10-CM | POA: Diagnosis present

## 2018-04-12 DIAGNOSIS — Z818 Family history of other mental and behavioral disorders: Secondary | ICD-10-CM | POA: Diagnosis not present

## 2018-04-12 DIAGNOSIS — F17200 Nicotine dependence, unspecified, uncomplicated: Secondary | ICD-10-CM | POA: Diagnosis present

## 2018-04-12 DIAGNOSIS — G4700 Insomnia, unspecified: Secondary | ICD-10-CM | POA: Diagnosis present

## 2018-04-12 DIAGNOSIS — Z5181 Encounter for therapeutic drug level monitoring: Secondary | ICD-10-CM | POA: Diagnosis not present

## 2018-04-12 DIAGNOSIS — F1510 Other stimulant abuse, uncomplicated: Secondary | ICD-10-CM | POA: Diagnosis present

## 2018-04-12 DIAGNOSIS — K219 Gastro-esophageal reflux disease without esophagitis: Secondary | ICD-10-CM | POA: Diagnosis present

## 2018-04-12 DIAGNOSIS — F1210 Cannabis abuse, uncomplicated: Secondary | ICD-10-CM | POA: Diagnosis present

## 2018-04-12 DIAGNOSIS — Z9114 Patient's other noncompliance with medication regimen: Secondary | ICD-10-CM | POA: Diagnosis not present

## 2018-04-12 DIAGNOSIS — F1520 Other stimulant dependence, uncomplicated: Secondary | ICD-10-CM | POA: Diagnosis present

## 2018-04-12 DIAGNOSIS — R45851 Suicidal ideations: Secondary | ICD-10-CM | POA: Diagnosis present

## 2018-04-12 DIAGNOSIS — Z888 Allergy status to other drugs, medicaments and biological substances status: Secondary | ICD-10-CM | POA: Diagnosis not present

## 2018-04-12 DIAGNOSIS — Z8669 Personal history of other diseases of the nervous system and sense organs: Secondary | ICD-10-CM

## 2018-04-12 MED ORDER — VENLAFAXINE HCL ER 75 MG PO CP24
150.0000 mg | ORAL_CAPSULE | Freq: Every day | ORAL | Status: DC
  Administered 2018-04-13 – 2018-04-21 (×9): 150 mg via ORAL
  Filled 2018-04-12 (×9): qty 2

## 2018-04-12 MED ORDER — LATANOPROST 0.005 % OP SOLN
1.0000 [drp] | Freq: Every day | OPHTHALMIC | Status: DC
  Administered 2018-04-12 – 2018-04-20 (×9): 1 [drp] via OPHTHALMIC
  Filled 2018-04-12: qty 2.5

## 2018-04-12 MED ORDER — TRAZODONE HCL 100 MG PO TABS: 100.0000 mg | ORAL_TABLET | Freq: Every evening | ORAL | Status: DC | PRN

## 2018-04-12 MED ORDER — MAGNESIUM HYDROXIDE 400 MG/5ML PO SUSP: 30.0000 mL | Freq: Every day | ORAL | Status: DC | PRN

## 2018-04-12 MED ORDER — NICOTINE 21 MG/24HR TD PT24
21.0000 mg | MEDICATED_PATCH | Freq: Every day | TRANSDERMAL | Status: DC
  Administered 2018-04-13 – 2018-04-21 (×8): 21 mg via TRANSDERMAL
  Filled 2018-04-12 (×7): qty 1

## 2018-04-12 MED ORDER — ARIPIPRAZOLE 10 MG PO TABS
30.0000 mg | ORAL_TABLET | Freq: Every day | ORAL | Status: DC
  Administered 2018-04-12 – 2018-04-18 (×7): 30 mg via ORAL
  Filled 2018-04-12 (×7): qty 3

## 2018-04-12 MED ORDER — QUETIAPINE FUMARATE 200 MG PO TABS: 200.0000 mg | ORAL_TABLET | Freq: Every evening | ORAL | Status: DC | PRN

## 2018-04-12 MED ORDER — LITHIUM CARBONATE ER 300 MG PO TBCR
600.0000 mg | EXTENDED_RELEASE_TABLET | Freq: Two times a day (BID) | ORAL | Status: DC
  Administered 2018-04-12 – 2018-04-18 (×13): 600 mg via ORAL
  Filled 2018-04-12 (×13): qty 2

## 2018-04-12 MED ORDER — DIVALPROEX SODIUM 500 MG PO DR TAB
750.0000 mg | DELAYED_RELEASE_TABLET | Freq: Two times a day (BID) | ORAL | Status: DC
  Administered 2018-04-12 – 2018-04-15 (×6): 750 mg via ORAL
  Filled 2018-04-12 (×6): qty 1

## 2018-04-12 MED ORDER — OLANZAPINE 10 MG PO TABS
30.0000 mg | ORAL_TABLET | Freq: Every day | ORAL | Status: DC
  Administered 2018-04-12: 30 mg via ORAL
  Filled 2018-04-12: qty 3

## 2018-04-12 MED ORDER — ALUM & MAG HYDROXIDE-SIMETH 200-200-20 MG/5ML PO SUSP: 30.0000 mL | ORAL | Status: DC | PRN

## 2018-04-12 MED ORDER — ACETAMINOPHEN 325 MG PO TABS: 650.0000 mg | ORAL_TABLET | Freq: Four times a day (QID) | ORAL | Status: DC | PRN

## 2018-04-12 NOTE — Tx Team (Signed)
Initial Treatment Plan 04/12/2018 4:18 PM [name] SEG:[id number]    PATIENT STRESSORS: Financial difficulties Loss of Loss of child Medication change or noncompliance Substance abuse   PATIENT STRENGTHS: Ability for insight Average or above average intelligence Capable of independent living Communication skills Supportive family/friends   PATIENT IDENTIFIED PROBLEMS: Depression  04/12/18  Suicide 04/12/18                   DISCHARGE CRITERIA:  Ability to meet basic life and health needs Improved stabilization in mood, thinking, and/or behavior Motivation to continue treatment in a less acute level of care  PRELIMINARY DISCHARGE PLAN: Outpatient therapy Return to previous living arrangement Return to previous work or school arrangements  PATIENT/FAMILY INVOLVEMENT: This treatment plan has been presented to and reviewed with the patient, Autumn [name], and/or family member,  .  The patient and family have been given the opportunity to ask questions and make suggestions.  Leodis Liverpool, RN 04/12/2018, 4:18 PM

## 2018-04-12 NOTE — ED Notes (Signed)
Hourly rounding reveals patient sleeping in room. No complaints, stable, in no acute distress. Q15 minute rounds and monitoring via Security Cameras to continue. 

## 2018-04-12 NOTE — ED Notes (Signed)
Patient denies SI/HI and A/V hallucinations. Patient states she has increased depression because of a custody battle for her child. She is calm and cooperative. Patient provided support and encouragement. Q 15 minute checks in progress and patient remains safe on unit.

## 2018-04-12 NOTE — BHH Suicide Risk Assessment (Signed)
[facility] Admission Suicide Risk Assessment   Nursing information obtained from:  Patient Demographic factors:  Caucasian, Low socioeconomic status Current Mental Status:  NA Loss Factors:  Loss of significant relationship(child) Historical Factors:  NA Risk Reduction Factors:  Positive coping skills or problem solving skills, Positive social support  Total Time spent with patient: 1 hour Principal Problem: <principal problem not specified> Diagnosis:   Patient Active Problem List   Diagnosis Date Noted  . Bipolar I disorder, most recent episode depressed, severe with psychotic features ([place]) [F31.5] 07/05/2017    Priority: High  . Schizoaffective disorder, depressive type ([place]) [F25.1] 04/12/2018  . Tobacco use disorder [F17.200] 06/27/2017  . Amphetamine use disorder, severe ([place]) [F15.20] 06/27/2017  . Opioid use disorder, severe, dependence ([place]) [F11.20] 06/27/2017  . Cannabis use disorder, severe, dependence ([place]) [F12.20] 06/27/2017  . Hx of glaucoma [Z86.69] 10/02/2013   Subjective Data: suicidal ideation  Continued Clinical Symptoms:  Alcohol Use Disorder Identification Test Final Score (AUDIT): 4 The "Alcohol Use Disorders Identification Test", Guidelines for Use in Primary Care, Second Edition.  World Pharmacologist [facility]). Score between 0-7:  no or low risk or alcohol related problems. Score between 8-15:  moderate risk of alcohol related problems. Score between 16-19:  high risk of alcohol related problems. Score 20 or above:  warrants further diagnostic evaluation for alcohol dependence and treatment.   CLINICAL FACTORS:   Bipolar Disorder:   Depressive phase Depression:   Comorbid alcohol abuse/dependence Impulsivity Insomnia Alcohol/Substance Abuse/Dependencies Currently Psychotic Previous Psychiatric Diagnoses and Treatments   Musculoskeletal: Strength & Muscle Tone: within normal limits Gait & Station: normal Patient leans: N/A  Psychiatric Specialty  Exam: Physical Exam  Nursing note and vitals reviewed. Psychiatric: Judgment normal. Her affect is blunt. Her speech is delayed. She is slowed, withdrawn and actively hallucinating. Thought content is paranoid and delusional. Cognition and memory are normal. She exhibits a depressed mood.    Review of Systems  Constitutional: Positive for weight loss.  Neurological: Negative.   Psychiatric/Behavioral: Positive for depression, hallucinations and suicidal ideas. The patient has insomnia.   All other systems reviewed and are negative.   Blood pressure (!) 83/61, pulse 66, temperature 97.9 F (36.6 C), temperature source Oral, resp. rate 16, height 5\' 2"  (1.575 m), weight 44.9 kg (99 lb), SpO2 100 %.Body mass index is 18.11 kg/m.  General Appearance: Disheveled  Eye Contact:  Minimal  Speech:  Slow  Volume:  Decreased  Mood:  Depressed  Affect:  Flat  Thought Process:  Goal Directed, Linear and Descriptions of Associations: Intact  Orientation:  Full (Time, Place, and Person)  Thought Content:  Delusions, Hallucinations: Auditory and Paranoid Ideation  Suicidal Thoughts:  Yes.  with intent/plan  Homicidal Thoughts:  No  Memory:  Immediate;   Fair Recent;   Fair Remote;   Fair  Judgement:  Impaired  Insight:  Shallow  Psychomotor Activity:  Psychomotor Retardation  Concentration:  Concentration: Fair and Attention Span: Fair  Recall:  [employer] of Knowledge:  Fair  Language:  Fair  Akathisia:  No  Handed:  Right  AIMS (if indicated):     Assets:  Communication Skills Desire for Improvement Financial Resources/Insurance Housing Intimacy Physical Health Resilience Social Support Vocational/Educational  ADL's:  Intact  Cognition:  WNL  Sleep:         COGNITIVE FEATURES THAT CONTRIBUTE TO RISK:  None    SUICIDE RISK:   Severe:  Frequent, intense, and enduring suicidal ideation, specific plan, no subjective  intent, but some objective markers of intent (i.e., choice of  lethal method), the method is accessible, some limited preparatory behavior, evidence of impaired self-control, severe dysphoria/symptomatology, multiple risk factors present, and few if any protective factors, particularly a lack of social support.  PLAN OF CARE: hospital admission, medication management, substance abus econseling, discharge planning.  Ms. Dayrit is a 42 year old female with a history of bipolar disorder admitted for suicidal ideation.  #Suicidal ideation -patient able to contract for safety in the hospital  #Mood and psychosis -Restart all her medications Effexor, Lithium, Depakote, Zyprexa, Abilify and Seroquel -aware of polypharmacy but it is usually necessary  #Substance abuse -minimizes problems and declines treatment  #Labs -lipid panel, TSH and A1C -EKG -pregnancy test  #Disposition -home with boyfriend -follow up with [facility]    I certify that inpatient services furnished can reasonably be expected to improve the patient's condition.   Orson Slick, MD 04/12/2018, 5:46 PM

## 2018-04-12 NOTE — BH Assessment (Signed)
Patient is to be admitted to [facility] by Dr. Bary Leriche.  Attending Physician will be Dr. Bary Leriche.   Patient has been assigned to room 307, by [place].   Intake Paper Work has been signed and placed on patient chart.  ER staff is aware of the admission:  Lisa: ER Sectary   Dr. Quentin Cornwall: ER MD   Pamala Hurry: Patient's Nurse   Elberta Fortis: Patient Access.

## 2018-04-12 NOTE — Progress Notes (Signed)
Admission Note:  D: Pt appeared depressed  With  a flat affect.  Pt  denies SI / AVH at this time. Patient reports being sad and depressed  Wants to get her daughter back . Voice of having not seen her  in a year.  Patient positive for amphetamines and pot.  Patient reports not eating or sleeping   Pt is redirectable and cooperative with assessment.      A: Pt admitted to unit per protocol, skin assessment and search done and no contraband found With [facility].  Pt  educated on therapeutic milieu rules. Pt was introduced to milieu by nursing staff.    R: Pt was receptive to education about the milieu .  15 min safety checks started. Probation officer offered support

## 2018-04-12 NOTE — ED Notes (Signed)
Hourly rounding reveals patient in day room talking to [facility]. No complaints, stable, in no acute distress. Q15 minute rounds and monitoring via [employer] to continue.

## 2018-04-12 NOTE — Consult Note (Signed)
Patient will be admitted to psychiatry.  Case discussed with ER physician. Orders entered. Full note to follow.

## 2018-04-12 NOTE — ED Notes (Signed)
Patient discharge and admitted to [facility] room 307. Report called to [facility]. Patient aware of admission and has no questions. Patient alert, oriented and ambulatory at discharge. Patient transported to unit via Paediatric nurse via wheelchair to [facility]. Vitals 98.1-100/56-55-18-100% room air. All patient belongings returned and transported to BMU with her.

## 2018-04-12 NOTE — Plan of Care (Signed)
New admission  has not started program  Problem: Education: Goal: Utilization of techniques to improve thought processes will improve Outcome: Not Progressing Goal: Knowledge of the prescribed therapeutic regimen will improve Outcome: Not Progressing   Problem: Coping: Goal: Coping ability will improve Outcome: Not Progressing Goal: Will verbalize feelings Outcome: Not Progressing   Problem: Education: Goal: Knowledge of [place] [facility] Education information/materials will improve Outcome: Not Progressing   Problem: Medication: Goal: Compliance with prescribed medication regimen will improve Outcome: Not Progressing   Problem: Self-Concept: Goal: Ability to disclose and discuss suicidal ideas will improve Outcome: Not Progressing

## 2018-04-12 NOTE — H&P (Addendum)
Psychiatric Admission Assessment Adult  Patient Identification: Autumn [name] MRN:  [id number] Date of Evaluation:  04/12/2018 Chief Complaint:  Depression Principal Diagnosis: Bipolar I disorder, most recent episode depressed, severe with psychotic features [facility]) Diagnosis:   Patient Active Problem List   Diagnosis Date Noted  . Bipolar I disorder, most recent episode depressed, severe with psychotic features ([place]) [F31.5] 07/05/2017    Priority: High  . Tobacco use disorder [F17.200] 06/27/2017  . Amphetamine use disorder, severe ([place]) [F15.20] 06/27/2017  . Opioid use disorder, severe, dependence ([place]) [F11.20] 06/27/2017  . Cannabis use disorder, severe, dependence ([place]) [F12.20] 06/27/2017  . Hx of glaucoma [Z86.69] 10/02/2013   History of Present Illness:   Identifying data. Autumn [name] is a 42 year old female with a history of bipolar disorder.  Chief complaint. "There was a mess with my medications."  HIstory of present illness. Information was obtained from the patient and the chart. The patient came to the ER complaining of severe depression of several weeks duration and suicidal ideation with a plan to overdose. She lost weight, has not been able to sleep, became fearful, paranoid and hallucinating. She has not gone to work since Monday. She has been under considerable stress trying to regain the custody of her 19 year old daughter who lives with paternal grandmother. This has been going on for years and is not a new stressor. There were some changes in her medication regimen. At times, Adda is unable to afford all necessary medications. As frequently, she is positve for amphetamines and cannabis.   Past psychiatric history. 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 Allergic to Haldol and refusing injectable.  Family psychiatric history. Mother with depression.  Social history.  Lives with her boyfriend at a boarding house. Refuses to apply for diasbility and continues to work. She is a good employee when well. Has private insurance.   Total Time spent with patient: 1 hour  Is the patient at risk to self? Yes.    Has the patient been a risk to self in the past 6 months? No.  Has the patient been a risk to self within the distant past? Yes.    Is the patient a risk to others? No.  Has the patient been a risk to others in the past 6 months? No.  Has the patient been a risk to others within the distant past? No.   Prior Inpatient Therapy:   Prior Outpatient Therapy:    Alcohol Screening: 1. How often do you have a drink containing alcohol?: Monthly or less 2. How many drinks containing alcohol do you have on a typical day when you are drinking?: 3 or 4 3. How often do you have six or more drinks on one occasion?: Monthly AUDIT-C Score: 4 4. How often during the last year have you found that you were not able to stop drinking once you had started?: Never 5. How often during the last year have you failed to do what was normally expected from you becasue of drinking?: Never 7. How often during the last year have you had a feeling of guilt of remorse after drinking?: Never 8. How often during the last year have you been unable to remember what happened the night before because you had been drinking?: Never 9. Have you or someone else been injured as a result of your drinking?: No 10. Has a relative or friend or a doctor or another health worker been concerned  about your drinking or suggested you cut down?: No Alcohol Use Disorder Identification Test Final Score (AUDIT): 4 Intervention/Follow-up: AUDIT Score <7 follow-up not indicated Substance Abuse History in the last 12 months:  Yes.   Consequences of Substance Abuse: Negative Previous Psychotropic Medications: Yes  Psychological Evaluations: No  Past Medical History:  Past Medical History:  Diagnosis Date  .  Allergy    Seasonal, Ultram (seizures)  . Anemia 2005  . Bipolar 1 disorder ([place])   . Glaucoma   . Seizures ([place]) 2008    Past Surgical History:  Procedure Laterality Date  . BREAST BIOPSY Right    x 2  . CESAREAN SECTION     x 2   Family History:  Family History  Problem Relation Age of Onset  . Hypertension Mother   . Hyperlipidemia Mother   . Parkinson's disease Father    Tobacco Screening: Have you used any form of tobacco in the last 30 days? (Cigarettes, Smokeless Tobacco, Cigars, and/or Pipes): Yes Tobacco use, Select all that apply: 5 or more cigarettes per day Are you interested in Tobacco Cessation Medications?: Yes, will notify MD for an order Counseled patient on smoking cessation including recognizing danger situations, developing coping skills and basic information about quitting provided: Refused/Declined practical counseling Social History:  Social History   Substance and Sexual Activity  Alcohol Use Yes  . Alcohol/week: 2.4 oz  . Types: 4 Cans of beer per week   Comment: Ocassional     Social History   Substance and Sexual Activity  Drug Use Yes  . Types: Marijuana   Comment: percocet -last use over 1 week ago    Additional Social History:                           Allergies:   Allergies  Allergen Reactions  . Haldol [Haloperidol Lactate]     Patient states that she gets stiff muscles when taking Haldol  . Ultram [Tramadol] Other (See Comments)    Seizures   Lab Results:  Results for orders placed or performed during the hospital encounter of 04/11/18 (from the past 48 hour(s))  Comprehensive metabolic panel     Status: Abnormal   Collection Time: 04/11/18  4:44 PM  Result Value Ref Range   Sodium 138 135 - 145 mmol/L   Potassium 3.3 (L) 3.5 - 5.1 mmol/L   Chloride 105 101 - 111 mmol/L   CO2 26 22 - 32 mmol/L   Glucose, Bld 76 65 - 99 mg/dL   BUN 14 6 - 20 mg/dL   Creatinine, Ser 0.71 0.44 - 1.00 mg/dL   Calcium 9.1 8.9 -  10.3 mg/dL   Total Protein 7.4 6.5 - 8.1 g/dL   Albumin 4.6 3.5 - 5.0 g/dL   AST 13 (L) 15 - 41 U/L   ALT 9 (L) 14 - 54 U/L   Alkaline Phosphatase 43 38 - 126 U/L   Total Bilirubin 0.6 0.3 - 1.2 mg/dL   GFR calc non Af Amer >60 >60 mL/min   GFR calc Af Amer >60 >60 mL/min    Comment: (NOTE) The eGFR has been calculated using the CKD EPI equation. This calculation has not been validated in all clinical situations. eGFR's persistently <60 mL/min signify possible Chronic Kidney Disease.    Anion gap 7 5 - 15    Comment: Performed at [facility], [address] [place], [place] [address]  Ethanol  Status: None   Collection Time: 04/11/18  4:44 PM  Result Value Ref Range   Alcohol, Ethyl (B) <10 <10 mg/dL    Comment: (NOTE) Lowest detectable limit for serum alcohol is 10 mg/dL. For medical purposes only. Performed at [facility], [address] [place], [place] [address]   Salicylate level     Status: None   Collection Time: 04/11/18  4:44 PM  Result Value Ref Range   Salicylate Lvl <6.4 2.8 - 30.0 mg/dL    Comment: Performed at [facility], [address] [place], [place] [address]  Acetaminophen level     Status: Abnormal   Collection Time: 04/11/18  4:44 PM  Result Value Ref Range   Acetaminophen (Tylenol), Serum <10 (L) 10 - 30 ug/mL    Comment: (NOTE) Therapeutic concentrations vary significantly. A range of 10-30 ug/mL  may be an effective concentration for many patients. However, some  are best treated at concentrations outside of this range. Acetaminophen concentrations >150 ug/mL at 4 hours after ingestion  and >50 ug/mL at 12 hours after ingestion are often associated with  toxic reactions. Performed at [facility], [address] [place], [place] [address]   cbc     Status: None   Collection Time: 04/11/18  4:44 PM  Result Value Ref Range   WBC 5.8 3.6 - 11.0 K/uL   RBC 4.50 3.80 - 5.20 MIL/uL   Hemoglobin  14.1 12.0 - 16.0 g/dL   HCT 41.4 35.0 - 47.0 %   MCV 92.0 80.0 - 100.0 fL   MCH 31.3 26.0 - 34.0 pg   MCHC 34.0 32.0 - 36.0 g/dL   RDW 13.0 11.5 - 14.5 %   Platelets 181 150 - 440 K/uL    Comment: Performed at [facility], [address], [place], [place] [address]  Urine Drug Screen, Qualitative     Status: Abnormal   Collection Time: 04/11/18  4:44 PM  Result Value Ref Range   Tricyclic, Ur Screen NONE DETECTED NONE DETECTED   Amphetamines, Ur Screen POSITIVE (A) NONE DETECTED   MDMA (Ecstasy)Ur Screen NONE DETECTED NONE DETECTED   Cocaine Metabolite,Ur [place] NONE DETECTED NONE DETECTED   Opiate, Ur Screen NONE DETECTED NONE DETECTED   Phencyclidine (PCP) Ur S NONE DETECTED NONE DETECTED   Cannabinoid 50 Ng, Ur [place] POSITIVE (A) NONE DETECTED   Barbiturates, Ur Screen NONE DETECTED NONE DETECTED   Benzodiazepine, Ur Scrn NONE DETECTED NONE DETECTED   Methadone Scn, Ur NONE DETECTED NONE DETECTED    Comment: (NOTE) Tricyclics + metabolites, urine    Cutoff 1000 ng/mL Amphetamines + metabolites, urine  Cutoff 1000 ng/mL MDMA (Ecstasy), urine              Cutoff 500 ng/mL Cocaine Metabolite, urine          Cutoff 300 ng/mL Opiate + metabolites, urine        Cutoff 300 ng/mL Phencyclidine (PCP), urine         Cutoff 25 ng/mL Cannabinoid, urine                 Cutoff 50 ng/mL Barbiturates + metabolites, urine  Cutoff 200 ng/mL Benzodiazepine, urine              Cutoff 200 ng/mL Methadone, urine                   Cutoff 300 ng/mL The urine drug screen provides only a preliminary, unconfirmed analytical test  result and should not be used for non-medical purposes. Clinical consideration and professional judgment should be applied to any positive drug screen result due to possible interfering substances. A more specific alternate chemical method must be used in order to obtain a confirmed analytical result. Gas chromatography / mass spectrometry (GC/MS) is the  preferred confirmat ory method. Performed at [facility], [address] [place], [place] [address]   Pregnancy, urine POC     Status: None   Collection Time: 04/11/18  4:51 PM  Result Value Ref Range   Preg Test, Ur NEGATIVE NEGATIVE    Comment:        THE SENSITIVITY OF THIS METHODOLOGY IS >24 mIU/mL     Blood Alcohol level:  Lab Results  Component Value Date   ETH <10 04/11/2018   ETH <10 96/29/5284    Metabolic Disorder Labs:  Lab Results  Component Value Date   HGBA1C 4.8 09/02/2017   MPG 91.06 09/02/2017   MPG 96.8 06/27/2017   No results found for: PROLACTIN Lab Results  Component Value Date   CHOL 165 09/02/2017   TRIG 97 09/02/2017   HDL 72 09/02/2017   CHOLHDL 2.3 09/02/2017   VLDL 19 09/02/2017   LDLCALC 74 09/02/2017   LDLCALC 79 06/27/2017    Current Medications: Current Facility-Administered Medications  Medication Dose Route Frequency Provider Last Rate Last Dose  . acetaminophen (TYLENOL) tablet 650 mg  650 mg Oral Q6H PRN Elazar Argabright B, MD      . alum & mag hydroxide-simeth (MAALOX/MYLANTA) 200-200-20 MG/5ML suspension 30 mL  30 mL Oral Q4H PRN Vaun Hyndman B, MD      . ARIPiprazole (ABILIFY) tablet 30 mg  30 mg Oral Daily Karima Carrell B, MD   30 mg at 04/12/18 1750  . divalproex (DEPAKOTE) DR tablet 750 mg  750 mg Oral Q12H Taleisha Kaczynski B, MD      . latanoprost (XALATAN) 0.005 % ophthalmic solution 1 drop  1 drop Both Eyes QHS Pilar Westergaard B, MD      . lithium carbonate (LITHOBID) CR tablet 600 mg  600 mg Oral Q12H Demitri Kucinski B, MD      . magnesium hydroxide (MILK OF MAGNESIA) suspension 30 mL  30 mL Oral Daily PRN Cliffard Hair B, MD      . Derrill Memo ON 04/13/2018] nicotine (NICODERM CQ - dosed in mg/24 hours) patch 21 mg  21 mg Transdermal Q0600 Kais Monje B, MD      . OLANZapine (ZYPREXA) tablet 30 mg  30 mg Oral QHS Jeri Jeanbaptiste B, MD      . QUEtiapine (SEROQUEL)  tablet 200 mg  200 mg Oral QHS PRN Jaydalyn Demattia B, MD      . traZODone (DESYREL) tablet 100 mg  100 mg Oral QHS PRN Aram Domzalski B, MD      . Derrill Memo ON 04/13/2018] venlafaxine XR (EFFEXOR-XR) 24 hr capsule 150 mg  150 mg Oral Q breakfast Kianah Harries B, MD       PTA Medications: Medications Prior to Admission  Medication Sig Dispense Refill Last Dose  . ARIPiprazole (ABILIFY) 30 MG tablet Take 1 tablet (30 mg total) daily by mouth. 30 tablet 1   . divalproex (DEPAKOTE) 250 MG DR tablet Take 3 tablets (750 mg total) every 12 (twelve) hours by mouth. 180 tablet 1   . latanoprost (XALATAN) 0.005 % ophthalmic solution Place 1 drop at bedtime into both eyes. 2.5 mL 12   . lithium  carbonate (LITHOBID) 300 MG CR tablet Take 2 tablets (600 mg total) every 12 (twelve) hours by mouth. 120 tablet 1   . OLANZapine (ZYPREXA) 15 MG tablet Take 2 tablets (30 mg total) at bedtime by mouth. 60 tablet 1   . QUEtiapine (SEROQUEL) 200 MG tablet Take 1 tablet (200 mg total) at bedtime as needed by mouth (sleep). 30 tablet 1   . venlafaxine XR (EFFEXOR-XR) 150 MG 24 hr capsule Take 1 capsule (150 mg total) daily with breakfast by mouth. 30 capsule 1     Musculoskeletal: Strength & Muscle Tone: within normal limits Gait & Station: normal Patient leans: N/A  Psychiatric Specialty Exam: Physical Exam  Nursing note and vitals reviewed. Psychiatric: Her affect is blunt. Her speech is delayed. She is slowed, withdrawn and actively hallucinating. Thought content is paranoid. Cognition and memory are normal. She expresses impulsivity. She exhibits a depressed mood. She expresses suicidal ideation.    Review of Systems  Constitutional: Positive for weight loss.  Neurological: Negative.   Psychiatric/Behavioral: Positive for depression, hallucinations, substance abuse and suicidal ideas. The patient has insomnia.     Blood pressure (!) 83/61, pulse 66, temperature 97.9 F (36.6 C), temperature  source Oral, resp. rate 16, height '5\' 2"'  (1.575 m), weight 44.9 kg (99 lb), SpO2 100 %.Body mass index is 18.11 kg/m.  See SRA                                                  Sleep:       Treatment Plan Summary: Daily contact with patient to assess and evaluate symptoms and progress in treatment and Medication management   [name] is a 42 year old female with a history of bipolar disorder admitted for suicidal ideation.  #Suicidal ideation -patient able to contract for safety in the hospital  #Mood and psychosis -Restart all her medications Effexor, Lithium, Depakote, Zyprexa, Abilify and Seroquel -aware of polypharmacy but it is usually necessary  #Substance abuse -minimizes problems and declines treatment  #Labs -lipid panel, TSH and A1C -EKG -pregnancy test  #Disposition -home with boyfriend -follow up with [facility]    Observation Level/Precautions:  15 minute checks  Laboratory:  CBC Chemistry Profile UDS UA  Psychotherapy:    Medications:    Consultations:    Discharge Concerns:    Estimated LOS:  Other:     Physician Treatment Plan for Primary Diagnosis: Bipolar I disorder, most recent episode depressed, severe with psychotic features ([place]) Long Term Goal(s): Improvement in symptoms so as ready for discharge  Short Term Goals: Ability to identify changes in lifestyle to reduce recurrence of condition will improve, Ability to verbalize feelings will improve, Ability to disclose and discuss suicidal ideas, Ability to demonstrate self-control will improve, Ability to identify and develop effective coping behaviors will improve, Ability to maintain clinical measurements within normal limits will improve, Compliance with prescribed medications will improve and Ability to identify triggers associated with substance abuse/mental health issues will improve  Physician Treatment Plan for Secondary Diagnosis: Principal Problem:   Bipolar I disorder,  most recent episode depressed, severe with psychotic features ([place]) Active Problems:   Hx of glaucoma   Tobacco use disorder   Amphetamine use disorder, severe ([place])   Cannabis use disorder, severe, dependence ([place])  Long Term Goal(s): Improvement in symptoms so as ready for discharge  Short  Term Goals: Ability to identify changes in lifestyle to reduce recurrence of condition will improve, Ability to demonstrate self-control will improve and Ability to identify triggers associated with substance abuse/mental health issues will improve  I certify that inpatient services furnished can reasonably be expected to improve the patient's condition.    Orson Slick, MD 6/5/20198:06 PM

## 2018-04-13 LAB — LITHIUM LEVEL: LITHIUM LVL: 0.38 mmol/L — AB (ref 0.60–1.20)

## 2018-04-13 LAB — LIPID PANEL
Cholesterol: 185 mg/dL (ref 0–200)
HDL: 52 mg/dL (ref >40)
LDL CALC: 117 mg/dL — AB (ref 0–99)
Total CHOL/HDL Ratio: 3.6 RATIO
Triglycerides: 81 mg/dL (ref <150)
VLDL: 16 mg/dL (ref 0–40)

## 2018-04-13 LAB — TSH: TSH: 1.595 u[IU]/mL (ref 0.350–4.500)

## 2018-04-13 LAB — HEMOGLOBIN A1C
HEMOGLOBIN A1C: 4.9 % (ref 4.8–5.6)
Mean Plasma Glucose: 93.93 mg/dL

## 2018-04-13 LAB — VALPROIC ACID LEVEL: VALPROIC ACID LVL: 91 ug/mL (ref 50.0–100.0)

## 2018-04-13 MED ORDER — QUETIAPINE FUMARATE 100 MG PO TABS
100.0000 mg | ORAL_TABLET | Freq: Every evening | ORAL | Status: DC | PRN
  Administered 2018-04-13 – 2018-04-14 (×2): 100 mg via ORAL
  Filled 2018-04-13 (×2): qty 1

## 2018-04-13 NOTE — Plan of Care (Signed)
Pleasant on approach, mostly in her room, A&Ox3, denied SI/HI/AVH, medication compliant.  Patient slept for Estimated Hours of 6.45; Precautionary checks every 15 minutes for safety maintained, room free of safety hazards, patient sustains no injury or falls during this shift.  Problem: Coping: Goal: Coping ability will improve Outcome: Progressing Goal: Will verbalize feelings Outcome: Progressing   Problem: Medication: Goal: Compliance with prescribed medication regimen will improve Outcome: Progressing   Problem: Self-Concept: Goal: Ability to disclose and discuss suicidal ideas will improve Outcome: Progressing

## 2018-04-13 NOTE — Plan of Care (Signed)
Patient is alert, denies SI, HI and AVH. Patient complains of depression 7/10. Patient states she is depressed due to not being able to talk with her daughter. Patient states she is also depressed because her boyfriend thinks she needs a better job. Patient states she has been at her current job for three years. Patient states she is going through a custody battle. Patient is interacting appropriately with staff and peers. Nurse will continue to monitor. Problem: Education: Goal: Utilization of techniques to improve thought processes will improve Outcome: Progressing Goal: Knowledge of the prescribed therapeutic regimen will improve Outcome: Progressing   Problem: Coping: Goal: Coping ability will improve Outcome: Progressing Goal: Will verbalize feelings Outcome: Progressing   Problem: Education: Goal: Knowledge of [facility] Education information/materials will improve Outcome: Progressing   Problem: Medication: Goal: Compliance with prescribed medication regimen will improve Outcome: Progressing

## 2018-04-13 NOTE — BHH Suicide Risk Assessment (Signed)
[place] INPATIENT:  Family/Significant Other Suicide Prevention Education  Suicide Prevention Education:  Contact Attempts: Dorien Chihuahua, pt's boyfriend, at [phone] has been identified by the patient as the family member/significant other with whom the patient will be residing, and identified as the person(s) who will aid the patient in the event of a mental health crisis.  With written consent from the patient, two attempts were made to provide suicide prevention education, prior to and/or following the patient's discharge.  We were unsuccessful in providing suicide prevention education.  A suicide education pamphlet was given to the patient to share with family/significant other.  Date and time of first attempt: 04/13/18 at [place] Date and time of second attempt:    Alden Hipp, Marlinda Mike 04/13/2018, 9:56 AM

## 2018-04-13 NOTE — Progress Notes (Signed)
Recreation Therapy Notes  Date: 04/13/2018  Time: 9:30 am   Location: Craft Room   Behavioral response: N/A   Intervention Topic:  Animal Assisted Therapy  Discussion/Intervention: Patient did not attend group.   Clinical Observations/Feedback:  Patient did not attend group.   Areana Kosanke LRT/CTRS        Dezaria Methot 04/13/2018 10:34 AM

## 2018-04-13 NOTE — BHH Group Notes (Signed)
LCSW Group Therapy Note 04/13/2018 9:00 AM  Type of Therapy and Topic:  Group Therapy:  Setting Goals  Participation Level:  Did Not Attend  Description of Group: In this process group, patients discussed using strengths to work toward goals and address challenges.  Patients identified two positive things about themselves and one goal they were working on.  Patients were given the opportunity to share openly and support each other's plan for self-empowerment.  The group discussed the value of gratitude and were encouraged to have a daily reflection of positive characteristics or circumstances.  Patients were encouraged to identify a plan to utilize their strengths to work on current challenges and goals.  Therapeutic Goals 1. Patient will verbalize personal strengths/positive qualities and relate how these can assist with achieving desired personal goals 2. Patients will verbalize affirmation of peers plans for personal change and goal setting 3. Patients will explore the value of gratitude and positive focus as related to successful achievement of goals 4. Patients will verbalize a plan for regular reinforcement of personal positive qualities and circumstances.  Summary of Patient Progress:  Nimrat was invited to today's group, but chose not to attend.     Therapeutic Modalities Cognitive Behavioral Therapy Motivational Interviewing    Devona Konig, [place] 04/13/2018 11:55 AM

## 2018-04-13 NOTE — Progress Notes (Addendum)
Recreation Therapy Notes  INPATIENT RECREATION THERAPY ASSESSMENT  Patient Details Name: Autumn [name] MRN: [id number] DOB: October 03, 1976 Today's Date: 04/13/2018      Patient would not complete assessment  Information Obtained From:    Able to Participate in Assessment/Interview:    Patient Presentation:    Reason for Admission (Per Patient):    Patient Stressors:    Coping Skills:      Leisure Interests (2+):     Frequency of Recreation/Participation:    Awareness of Community Resources:     [employer]:     Current Use:    If no, Barriers?:    Expressed Interest in [place] of Residence:     Patient Main Form of Transportation:    Patient Strengths:     Patient Identified Areas of Improvement:     Patient Goal for Hospitalization:     Current SI (including self-harm):     Current HI:     Current AVH:    Staff Intervention Plan:    Consent to Intern Participation:    Autumn [name] 04/13/2018, 3:46 PM

## 2018-04-13 NOTE — [facility] Suicide Risk Assessment (Signed)
[place] INPATIENT:  Family/Significant Other Suicide Prevention Education  Suicide Prevention Education:  Education Completed; Dorien Chihuahua, pt's boyfriend, at [phone] has been identified by the patient as the family member/significant other with whom the patient will be residing, and identified as the person(s) who will aid the patient in the event of a mental health crisis (suicidal ideations/suicide attempt).  With written consent from the patient, the family member/significant other has been provided the following suicide prevention education, prior to the and/or following the discharge of the patient.  The suicide prevention education provided includes the following:  Suicide risk factors  Suicide prevention and interventions  National Suicide Hotline telephone number  [facility] assessment telephone number  [facility] Emergency Assistance [place] and/or Residential Mobile Crisis Unit telephone number  Request made of family/significant other to:  Remove weapons (e.g., guns, rifles, knives), all items previously/currently identified as safety concern.    Remove drugs/medications (over-the-counter, prescriptions, illicit drugs), all items previously/currently identified as a safety concern.  The family member/significant other verbalizes understanding of the suicide prevention education information provided.  The family member/significant other agrees to remove the items of safety concern listed above.  Pt's husband added, "Really the only thing is her depression. It's just gotten out of control. She wasn't taking care of herself. She was bathing maybe once every week. It was a struggle just to get her up to go to work every day. I know this starts and then gets worse. I told her she just needed to do something about it. She was leaving work early and leaving a lot of work. It's been getting worse over the last couple months-maybe four months. We tried  to start getting up and go for a walk in the morning, but that just kind of stopped. I'm glad she finally decided on her own to go get some help. I know she was miserable, and said she wanted things to be different. Because of the past, I know what's coming. If it went unchecked, I knew what would happen. She had been taking her meds regularly, but I felt like she was kind of lying to me. I'd tell her I didn't believe her. But, I saw her behaviors changing." CSW will continue to coordinate with pt's husband as needed for updates/further discharge planning.   Alden Hipp, LCSW 04/13/2018, 3:02 PM
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
## 2018-04-13 NOTE — [facility] Group Notes (Signed)
  04/13/2018  Time: 1PM  Type of Therapy/Topic:  Group Therapy:  Balance in Life  Participation Level:  Did Not Attend  Description of Group:   This group will address the concept of balance and how it feels and looks when one is unbalanced. Patients will be encouraged to process areas in their lives that are out of balance and identify reasons for remaining unbalanced. Facilitators will guide patients in utilizing problem-solving interventions to address and correct the stressor making their life unbalanced. Understanding and applying boundaries will be explored and addressed for obtaining and maintaining a balanced life. Patients will be encouraged to explore ways to assertively make their unbalanced needs known to significant others in their lives, using other group members and facilitator for support and feedback.  Therapeutic Goals: 1. Patient will identify two or more emotions or situations they have that consume much of in their lives. 2. Patient will identify signs/triggers that life has become out of balance:  3. Patient will identify two ways to set boundaries in order to achieve balance in their lives:  4. Patient will demonstrate ability to communicate their needs through discussion and/or role plays  Summary of Patient Progress: Pt was invited to attend group but chose not to attend. CSW will continue to encourage pt to attend group throughout their admission.    Therapeutic Modalities:   Cognitive Behavioral Therapy Solution-Focused Therapy Assertiveness Training  Alden Hipp, MSW, LCSW Clinical Social Worker 04/13/2018 2:05 PM

## 2018-04-13 NOTE — Progress Notes (Signed)
[facility] MD Progress Note  04/13/2018 9:19 AM Autumn [name]  MRN:  [id number]  Subjective:    Autumn [name] is more aware today and reports that she has stopped taking Lithium, Abilify and Zyprexa but continue on Depakote. Dr. Miles Costain, her primary psychiatrist, tried to switch her from Effexor to Cymbalta but she could not tolerate it. This morning, she feels overmedicated after we restarted all her medications from previous admission. She refuses to take Zyprexa as she believes it causes hair loss. She does not blame Depakote as she has been taking it for "so long".  Principal Problem: Bipolar I disorder, most recent episode depressed, severe with psychotic features ([place]) Diagnosis:   Patient Active Problem List   Diagnosis Date Noted  . Bipolar I disorder, most recent episode depressed, severe with psychotic features ([place]) [F31.5] 07/05/2017    Priority: High  . Tobacco use disorder [F17.200] 06/27/2017  . 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 Opioid use disorder, severe, dependence ([place]) [F11.20] 06/27/2017  . Cannabis use disorder, severe, dependence ([place]) [F12.20] 06/27/2017  . Hx of glaucoma [Z86.69] 10/02/2013   Total Time spent with patient: 20 minutes  Past Psychiatric History: bipolar disorder  Past Medical History:  Past Medical History:  Diagnosis Date  . Allergy    Seasonal, Ultram (seizures)  . Anemia 2005  . Bipolar 1 disorder ([place])   . Glaucoma   . Seizures ([place]) 2008    Past Surgical History:  Procedure Laterality Date  . BREAST BIOPSY Right    x 2  . CESAREAN SECTION     x 2   Family History:  Family History  Problem Relation Age of Onset  . Hypertension Mother   . Hyperlipidemia Mother   . Parkinson's disease Father    Family Psychiatric  History: depression Social History:  Social History   Substance and Sexual Activity  Alcohol Use Yes  . Alcohol/week: 2.4 oz  . Types: 4 Cans of beer per week   Comment: Ocassional      Social History   Substance and Sexual Activity  Drug Use Yes  . Types: Marijuana   Comment: percocet -last use over 1 week ago    Social History   Socioeconomic History  . Marital status: Single    Spouse name: Not on file  . Number of children: Not on file  . Years of education: Not on file  . Highest education level: Not on file  Occupational History  . Not on file  Social Needs  . Financial resource strain: Not on file  . Food insecurity:    Worry: Not on file    Inability: Not on file  . Transportation needs:    Medical: Not on file    Non-medical: Not on file  Tobacco Use  . Smoking status: Current Every Day Smoker    Packs/day: 1.00    Years: 9.00    Pack years: 9.00  . Smokeless tobacco: Never Used  . Tobacco comment: Patient not interested  Substance and Sexual Activity  . Alcohol use: Yes    Alcohol/week: 2.4 oz    Types: 4 Cans of beer per week    Comment: Ocassional  . Drug use: Yes    Types: Marijuana    Comment: percocet -last use over 1 week ago  . Sexual activity: Never    Birth control/protection: None  Lifestyle  . Physical activity:    Days per week: Not on file    Minutes per  session: Not on file  . Stress: Not on file  Relationships  . Social connections:    Talks on phone: Not on file    Gets together: Not on file    Attends religious service: Not on file    Active member of club or organization: Not on file    Attends meetings of clubs or organizations: Not on file    Relationship status: Not on file  Other Topics Concern  . Not on file  Social History Narrative  . Not on file   Additional Social History:                         Sleep: Fair  Appetite:  Fair  Current Medications: Current Facility-Administered Medications  Medication Dose Route Frequency Provider Last Rate Last Dose  . acetaminophen (TYLENOL) tablet 650 mg  650 mg Oral Q6H PRN Loretta Doutt B, MD      . alum & mag hydroxide-simeth  (MAALOX/MYLANTA) 200-200-20 MG/5ML suspension 30 mL  30 mL Oral Q4H PRN Ethelyne Erich B, MD      . ARIPiprazole (ABILIFY) tablet 30 mg  30 mg Oral Daily Hagan Vanauken B, MD   30 mg at 04/13/18 0910  . divalproex (DEPAKOTE) DR tablet 750 mg  750 mg Oral Q12H Mida Cory B, MD   750 mg at 04/13/18 0910  . latanoprost (XALATAN) 0.005 % ophthalmic solution 1 drop  1 drop Both Eyes QHS Iyesha Such B, MD   1 drop at 04/12/18 2157  . lithium carbonate (LITHOBID) CR tablet 600 mg  600 mg Oral Q12H Randye Treichler B, MD   600 mg at 04/13/18 0910  . magnesium hydroxide (MILK OF MAGNESIA) suspension 30 mL  30 mL Oral Daily PRN Rickard Kennerly B, MD      . nicotine (NICODERM CQ - dosed in mg/24 hours) patch 21 mg  21 mg Transdermal Q0600 Abir Craine B, MD   21 mg at 04/13/18 0910  . QUEtiapine (SEROQUEL) tablet 100 mg  100 mg Oral QHS PRN Joany Khatib B, MD      . venlafaxine XR (EFFEXOR-XR) 24 hr capsule 150 mg  150 mg Oral Q breakfast Yolinda Duerr B, MD   150 mg at 04/13/18 0909    Lab Results:  Results for orders placed or performed during the hospital encounter of 04/12/18 (from the past 48 hour(s))  Lipid panel     Status: Abnormal   Collection Time: 04/13/18  6:47 AM  Result Value Ref Range   Cholesterol 185 0 - 200 mg/dL   Triglycerides 81 <150 mg/dL   HDL 52 >40 mg/dL   Total CHOL/HDL Ratio 3.6 RATIO   VLDL 16 0 - 40 mg/dL   LDL Cholesterol 117 (H) 0 - 99 mg/dL    Comment:        Total Cholesterol/HDL:CHD Risk Coronary Heart Disease Risk Table                     Men   Women  1/2 Average Risk   3.4   3.3  Average Risk       5.0   4.4  2 X Average Risk   9.6   7.1  3 X Average Risk  23.4   11.0        Use the calculated Patient Ratio above and the CHD Risk Table to determine the patient's CHD Risk.        ATP  III CLASSIFICATION (LDL):  <100     mg/dL   Optimal  100-129  mg/dL   Near or Above                    Optimal   130-159  mg/dL   Borderline  160-189  mg/dL   High  >190     mg/dL   Very High Performed at [facility], [address] [place], [place] [address]   TSH     Status: None   Collection Time: 04/13/18  6:47 AM  Result Value Ref Range   TSH 1.595 0.350 - 4.500 uIU/mL    Comment: Performed by a 3rd Generation assay with a functional sensitivity of <=0.01 uIU/mL. Performed at [facility], [address] [place], [place] [address]   Lithium level     Status: Abnormal   Collection Time: 04/13/18  6:47 AM  Result Value Ref Range   Lithium Lvl 0.38 (L) 0.60 - 1.20 mmol/L    Comment: Performed at [facility], [address] [place], [place] [address]  Valproic acid level     Status: None   Collection Time: 04/13/18  6:47 AM  Result Value Ref Range   Valproic Acid Lvl 91 50.0 - 100.0 ug/mL    Comment: Performed at [facility], [address] [place], [place] [address]    Blood Alcohol level:  Lab Results  Component Value Date   [facility] <10 04/11/2018   ETH <10 01/60/1093    Metabolic Disorder Labs: Lab Results  Component Value Date   HGBA1C 4.8 09/02/2017   MPG 91.06 09/02/2017   MPG 96.8 06/27/2017   No results found for: PROLACTIN Lab Results  Component Value Date   CHOL 185 04/13/2018   TRIG 81 04/13/2018   HDL 52 04/13/2018   CHOLHDL 3.6 04/13/2018   VLDL 16 04/13/2018   LDLCALC 117 (H) 04/13/2018   LDLCALC 74 09/02/2017    Physical Findings: AIMS:  , ,  ,  ,    CIWA:    COWS:     Musculoskeletal: Strength & Muscle Tone: within normal limits Gait & Station: normal Patient leans: N/A  Psychiatric Specialty Exam: Physical Exam  Nursing note and vitals reviewed. Psychiatric: Her affect is blunt. Her speech is delayed. She is slowed and withdrawn. Thought content is paranoid. Cognition and memory are normal. She expresses impulsivity. She exhibits a depressed mood. She expresses suicidal ideation.    Review of Systems   Neurological: Negative.   Psychiatric/Behavioral: Positive for depression, substance abuse and suicidal ideas.  All other systems reviewed and are negative.   Blood pressure (!) 80/54, pulse (!) 102, temperature 97.7 F (36.5 C), temperature source Oral, resp. rate 18, height 5\' 2"  (1.575 m), weight 44.9 kg (99 lb), SpO2 100 %.Body mass index is 18.11 kg/m.  General Appearance: Fairly Groomed  Eye Contact:  Good  Speech:  Clear and Coherent  Volume:  Decreased  Mood:  Depressed  Affect:  Flat  Thought Process:  Goal Directed and Descriptions of Associations: Intact  Orientation:  Full (Time, Place, and Person)  Thought Content:  Paranoid Ideation  Suicidal Thoughts:  Yes.  with intent/plan  Homicidal Thoughts:  No  Memory:  Immediate;   Fair Recent;   Fair Remote;   Fair  Judgement:  Poor  Insight:  Shallow  Psychomotor Activity:  Psychomotor Retardation  Concentration:  Concentration: Fair and Attention Span: Fair  Recall:  [employer]  of Knowledge:  Fair  Language:  Fair  Akathisia:  No  Handed:  Right  AIMS (if indicated):     Assets:  Communication Skills Desire for Improvement Financial Resources/Insurance Housing Intimacy Physical Health Resilience Social Support Vocational/Educational  ADL's:  Intact  Cognition:  WNL  Sleep:  Number of Hours: 6.45     Treatment Plan Summary: Daily contact with patient to assess and evaluate symptoms and progress in treatment and Medication management   Ms. Sula is a 42 year old female with a history of bipolar disorder admitted for suicidal ideation and worsening depression with paranoia.  #Suicidal ideation, still suicidal -patient able to contract for safety in the hospital  #Mood and psychosis -restart Effexor 150 mg daily -continue Depakote 750 mg BID, VPA level 91 -restart Lithium 600 mg BID, level 0.38 -restart Abilify 30 mg daily -restart Seroquel 100 mg nightly -we will hold Zyprexa -aware of  polypharmacy but it is usually necessary  #Substance abuse -positive for amphetamines and cannabis -minimizes problems and declines treatment  #Smoking cassation -nicotine patch is available  #Labs -lipid panel, TSH and A1C -EKG -pregnancy test negative  #Disposition -home with boyfriend -follow up with [facility]     Orson Slick, MD 04/13/2018, 9:19 AM

## 2018-04-14 MED ORDER — ARIPIPRAZOLE ER 400 MG IM SRER
400.0000 mg | INTRAMUSCULAR | Status: DC
  Administered 2018-04-14: 400 mg via INTRAMUSCULAR
  Filled 2018-04-14: qty 2

## 2018-04-14 NOTE — Progress Notes (Signed)
[facility] MD Progress Note  04/14/2018 3:02 PM Autumn [name]  MRN:  [id number]  Subjective:   [name] met with treatment team today. She still feels depressed and has passing suicidal thoughts but is no longer hallucinating or paranoid. She tolerates medications well and no longer complains of excessive sedation. There are no somatic complaints. She admits now that she has not been compliant with medications, except Depakote, for 6 months. For the past 2 months, she has not been able to keep up with her job. She agreed to Abilify maintena injections. 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  Principal Problem: Bipolar I disorder, most recent episode depressed, severe with psychotic features ([place]) Diagnosis:   Patient Active Problem List   Diagnosis Date Noted  . Bipolar I disorder, most recent episode depressed, severe with psychotic features ([place]) [F31.5] 07/05/2017    Priority: High  . Tobacco use disorder [F17.200] 06/27/2017  . Amphetamine use disorder, severe ([place]) [F15.20] 06/27/2017  . Opioid use disorder, severe, dependence ([place]) [F11.20] 06/27/2017  . Cannabis use disorder, severe, dependence ([place]) [F12.20] 06/27/2017  . Hx of glaucoma [Z86.69] 10/02/2013   Total Time spent with patient: 20 minutes  Past Psychiatric History: bipolar disorder   Past Medical History:  Past Medical History:  Diagnosis Date  . Allergy    Seasonal, Ultram (seizures)  . Anemia 2005  . Bipolar 1 disorder ([place])   . Glaucoma   . Seizures ([employer]) 2008    Past Surgical History:  Procedure Laterality Date  . BREAST BIOPSY Right    x 2  . CESAREAN SECTION     x 2   Family History:  Family History  Problem Relation Age of Onset  . Hypertension Mother   . Hyperlipidemia Mother   . Parkinson's disease Father    Family Psychiatric  History: depression Social History:  Social History   Substance and Sexual Activity  Alcohol Use Yes  .  Alcohol/week: 2.4 oz  . Types: 4 Cans of beer per week   Comment: Ocassional     Social History   Substance and Sexual Activity  Drug Use Yes  . Types: Marijuana   Comment: percocet -last use over 1 week ago    Social History   Socioeconomic History  . Marital status: Single    Spouse name: Not on file  . Number of children: Not on file  . Years of education: Not on file  . Highest education level: Not on file  Occupational History  . Not on file  Social Needs  . Financial resource strain: Not on file  . Food insecurity:    Worry: Not on file    Inability: Not on file  . Transportation needs:    Medical: Not on file    Non-medical: Not on file  Tobacco Use  . Smoking status: Current Every Day Smoker    Packs/day: 1.00    Years: 9.00    Pack years: 9.00  . Smokeless tobacco: Never Used  . Tobacco comment: Patient not interested  Substance and Sexual Activity  . Alcohol use: Yes    Alcohol/week: 2.4 oz    Types: 4 Cans of beer per week    Comment: Ocassional  . Drug use: Yes    Types: Marijuana    Comment: percocet -last use over 1 week ago  . Sexual activity: Never    Birth control/protection: None  Lifestyle  . Physical activity:    Days  per week: Not on file    Minutes per session: Not on file  . Stress: Not on file  Relationships  . Social connections:    Talks on phone: Not on file    Gets together: Not on file    Attends religious service: Not on file    Active member of club or organization: Not on file    Attends meetings of clubs or organizations: Not on file    Relationship status: Not on file  Other Topics Concern  . Not on file  Social History Narrative  . Not on file   Additional Social History:                         Sleep: Good  Appetite:  Fair  Current Medications: Current Facility-Administered Medications  Medication Dose Route Frequency Provider Last Rate Last Dose  . acetaminophen (TYLENOL) tablet 650 mg  650 mg  Oral Q6H PRN Davena Julian B, MD      . alum & mag hydroxide-simeth (MAALOX/MYLANTA) 200-200-20 MG/5ML suspension 30 mL  30 mL Oral Q4H PRN Ann-Marie Kluge B, MD      . ARIPiprazole (ABILIFY) tablet 30 mg  30 mg Oral Daily Augustino Savastano B, MD   30 mg at 04/14/18 0901  . ARIPiprazole ER (ABILIFY MAINTENA) injection 400 mg  400 mg Intramuscular Q28 days Gabbi Whetstone B, MD      . divalproex (DEPAKOTE) DR tablet 750 mg  750 mg Oral Q12H Fabiola Mudgett B, MD   750 mg at 04/14/18 0901  . latanoprost (XALATAN) 0.005 % ophthalmic solution 1 drop  1 drop Both Eyes QHS Peyson Postema B, MD   1 drop at 04/13/18 2149  . lithium carbonate (LITHOBID) CR tablet 600 mg  600 mg Oral Q12H Jazper Nikolai B, MD   600 mg at 04/14/18 0901  . magnesium hydroxide (MILK OF MAGNESIA) suspension 30 mL  30 mL Oral Daily PRN Nash Bolls B, MD      . nicotine (NICODERM CQ - dosed in mg/24 hours) patch 21 mg  21 mg Transdermal Q0600 Aicha Clingenpeel B, MD   21 mg at 04/14/18 0558  . QUEtiapine (SEROQUEL) tablet 100 mg  100 mg Oral QHS PRN Lawrance Wiedemann B, MD   100 mg at 04/13/18 2148  . venlafaxine XR (EFFEXOR-XR) 24 hr capsule 150 mg  150 mg Oral Q breakfast Besan Ketchem B, MD   150 mg at 04/14/18 0902    Lab Results:  Results for orders placed or performed during the hospital encounter of 04/12/18 (from the past 48 hour(s))  Hemoglobin A1c     Status: None   Collection Time: 04/13/18  6:47 AM  Result Value Ref Range   Hgb A1c MFr Bld 4.9 4.8 - 5.6 %    Comment: (NOTE) Pre diabetes:          5.7%-6.4% Diabetes:              >6.4% Glycemic control for   <7.0% adults with diabetes    Mean Plasma Glucose 93.93 mg/dL    Comment: Performed at [facility], [place] [address], [place], [place] [address]  Lipid panel     Status: Abnormal   Collection Time: 04/13/18  6:47 AM  Result Value Ref Range   Cholesterol 185 0 - 200 mg/dL   Triglycerides 81 <150 mg/dL    HDL 52 >40 mg/dL   Total CHOL/HDL Ratio 3.6 RATIO  VLDL 16 0 - 40 mg/dL   LDL Cholesterol 117 (H) 0 - 99 mg/dL    Comment:        Total Cholesterol/HDL:CHD Risk Coronary Heart Disease Risk Table                     Men   Women  1/2 Average Risk   3.4   3.3  Average Risk       5.0   4.4  2 X Average Risk   9.6   7.1  3 X Average Risk  23.4   11.0        Use the calculated Patient Ratio above and the CHD Risk Table to determine the patient's CHD Risk.        ATP III CLASSIFICATION (LDL):  <100     mg/dL   Optimal  100-129  mg/dL   Near or Above                    Optimal  130-159  mg/dL   Borderline  160-189  mg/dL   High  >190     mg/dL   Very High Performed at [facility], [name] [place], [place] [address]   TSH     Status: None   Collection Time: 04/13/18  6:47 AM  Result Value Ref Range   TSH 1.595 0.350 - 4.500 uIU/mL    Comment: Performed by a 3rd Generation assay with a functional sensitivity of <=0.01 uIU/mL. Performed at [facility], [address] [place], [place] [address]   Lithium level     Status: Abnormal   Collection Time: 04/13/18  6:47 AM  Result Value Ref Range   Lithium Lvl 0.38 (L) 0.60 - 1.20 mmol/L    Comment: Performed at [facility], [place], [address] [place], [place] [address]  Valproic acid level     Status: None   Collection Time: 04/13/18  6:47 AM  Result Value Ref Range   Valproic Acid Lvl 91 50.0 - 100.0 ug/mL    Comment: Performed at [facility], [address] [place], [place] [address]    Blood Alcohol level:  Lab Results  Component Value Date   [facility] <10 04/11/2018   ETH <10 32/20/2542    Metabolic Disorder Labs: Lab Results  Component Value Date   HGBA1C 4.9 04/13/2018   MPG 93.93 04/13/2018   MPG 91.06 09/02/2017   No results found for: PROLACTIN Lab Results  Component Value Date   CHOL 185 04/13/2018   TRIG 81 04/13/2018   HDL 52 04/13/2018   CHOLHDL 3.6  04/13/2018   VLDL 16 04/13/2018   LDLCALC 117 (H) 04/13/2018   LDLCALC 74 09/02/2017    Physical Findings: AIMS:  , ,  ,  ,    CIWA:    COWS:     Musculoskeletal: Strength & Muscle Tone: within normal limits Gait & Station: normal Patient leans: N/A  Psychiatric Specialty Exam: Physical Exam  Nursing note and vitals reviewed. Psychiatric: Her speech is normal. Her affect is blunt. She is slowed and withdrawn. Cognition and memory are normal. She expresses impulsivity. She exhibits a depressed mood. She expresses suicidal ideation.    Review of Systems  Neurological: Negative.   Psychiatric/Behavioral: Positive for depression, substance abuse and suicidal ideas.  All other systems reviewed and are negative.   Blood pressure 97/64, pulse 78, temperature 98.1 F (36.7 C), temperature source Oral, resp. rate 18,  height '5\' 2"'  (1.575 m), weight 44.9 kg (99 lb), SpO2 99 %.Body mass index is 18.11 kg/m.  General Appearance: Casual  Eye Contact:  Good  Speech:  Clear and Coherent  Volume:  Normal  Mood:  Depressed, Hopeless and Worthless  Affect:  Flat  Thought Process:  Goal Directed and Descriptions of Associations: Intact  Orientation:  Full (Time, Place, and Person)  Thought Content:  WDL  Suicidal Thoughts:  Yes.  with intent/plan  Homicidal Thoughts:  No  Memory:  Immediate;   Fair Recent;   Fair Remote;   Fair  Judgement:  Impaired  Insight:  Shallow  Psychomotor Activity:  Psychomotor Retardation  Concentration:  Concentration: Fair and Attention Span: Fair  Recall:  [employer] of Knowledge:  Fair  Language:  Fair  Akathisia:  No  Handed:  Left  AIMS (if indicated):     Assets:  Communication Skills Desire for Improvement Financial Resources/Insurance Housing Intimacy Physical Health Resilience Social Support Vocational/Educational  ADL's:  Intact  Cognition:  WNL  Sleep:  Number of Hours: 8.25     Treatment Plan Summary: Daily contact with patient  to assess and evaluate symptoms and progress in treatment and Medication management   Ms. Vannostrand is a 42 year old female with a history of bipolar disorder admitted for suicidal ideation and worsening depression.   #Suicidal ideation, still suicidal -patient able to contract for safety in the hospital  #Mood and psychosis, improving -continue Effexor 150 mg daily -continue Depakote 750 mg BID, VPA level 91 -restart Lithium 600 mg BID, level 0.38 -restart Abilify 30 mg daily -restart Seroquel 100 mg nightly -Abilify maintena 400 mg every 28 days, first dose today  #Substance abuse -positive for amphetamines and cannabis -minimizes problems and declines treatment  #Smoking cassation -nicotine patch is available  #Labs -lipid panel, TSH and A1C completed -EKG reviewed, QTc 409 -pregnancy test negative  #Disposition -home with boyfriend -follow up with [facility]    Orson Slick, MD 04/14/2018, 3:02 PM

## 2018-04-14 NOTE — BHH Group Notes (Signed)
[place] Group Notes:  (Nursing/MHT/Case Management/Adjunct)  Date:  04/14/2018  Time:  9:37 PM  Type of Therapy:  Group Therapy  Participation Level:  Did Not Attend  Summary of Progress/Problems:  Autumn [name] 04/14/2018, 9:37 PM

## 2018-04-14 NOTE — Tx Team (Signed)
Interdisciplinary Treatment and Diagnostic Plan Update  04/14/2018 Time of Session: [place] MRN: [id number]  Principal Diagnosis: Bipolar I disorder, most recent episode depressed, severe with psychotic features ([place])  Secondary Diagnoses: Principal Problem:   Bipolar I disorder, most recent episode depressed, severe with psychotic features ([place]) Active Problems:   Hx of glaucoma   Tobacco use disorder   Amphetamine use disorder, severe (HCC)   Cannabis use disorder, severe, dependence ([place])   Current Medications:  Current Facility-Administered Medications  Medication Dose Route Frequency Provider Last Rate Last Dose  . acetaminophen (TYLENOL) tablet 650 mg  650 mg Oral Q6H PRN [name], [name], MD      . alum & mag hydroxide-simeth (MAALOX/MYLANTA) 200-200-20 MG/5ML suspension 30 mL  30 mL Oral Q4H PRN [name], [name], MD      . ARIPiprazole (ABILIFY) tablet 30 mg  30 mg Oral Daily [name], [name], MD   30 mg at 04/14/18 0901  . ARIPiprazole ER (ABILIFY MAINTENA) injection 400 mg  400 mg Intramuscular Q28 days [name], [name], MD      . divalproex (DEPAKOTE) DR tablet 750 mg  750 mg Oral Q12H [name], [name], MD   750 mg at 04/14/18 0901  . latanoprost (XALATAN) 0.005 % ophthalmic solution 1 drop  1 drop Both Eyes QHS [name], [name], MD   1 drop at 04/13/18 2149  . lithium carbonate (LITHOBID) CR tablet 600 mg  600 mg Oral Q12H [name], [name], MD   600 mg at 04/14/18 0901  . magnesium hydroxide (MILK OF MAGNESIA) suspension 30 mL  30 mL Oral Daily PRN [name], [name], MD      . nicotine (NICODERM CQ - dosed in mg/24 hours) patch 21 mg  21 mg Transdermal Q0600 [name], [name], MD   21 mg at 04/14/18 0558  . QUEtiapine (SEROQUEL) tablet 100 mg  100 mg Oral QHS PRN [name], [name], MD   100 mg at 04/13/18 2148  . venlafaxine XR (EFFEXOR-XR) 24 hr capsule 150 mg  150 mg Oral Q breakfast [name], [name], MD    150 mg at 04/14/18 0902   PTA Medications: Medications Prior to Admission  Medication Sig Dispense Refill Last Dose  . ARIPiprazole (ABILIFY) 30 MG tablet Take 1 tablet (30 mg total) daily by mouth. 30 tablet 1   . divalproex (DEPAKOTE) 250 MG DR tablet Take 3 tablets (750 mg total) every 12 (twelve) hours by mouth. 180 tablet 1   . latanoprost (XALATAN) 0.005 % ophthalmic solution Place 1 drop at bedtime into both eyes. 2.5 mL 12   . lithium carbonate (LITHOBID) 300 MG CR tablet Take 2 tablets (600 mg total) every 12 (twelve) hours by mouth. 120 tablet 1   . OLANZapine (ZYPREXA) 15 MG tablet Take 2 tablets (30 mg total) at bedtime by mouth. 60 tablet 1   . QUEtiapine (SEROQUEL) 200 MG tablet Take 1 tablet (200 mg total) at bedtime as needed by mouth (sleep). 30 tablet 1   . venlafaxine XR (EFFEXOR-XR) 150 MG 24 hr capsule Take 1 capsule (150 mg total) daily with breakfast by mouth. 30 capsule 1     Patient Stressors: Financial difficulties Loss of Loss of child Medication change or noncompliance Substance abuse  Patient Strengths: Ability for insight Average or above average intelligence Capable of independent living Communication skills Supportive family/friends  Treatment Modalities: Medication Management, Group therapy, Case management,  1 to 1 session with clinician, Psychoeducation, Recreational therapy.  Physician Treatment Plan for Primary Diagnosis: Bipolar I disorder, most recent episode depressed, severe with psychotic features ([place]) Long Term Goal(s): Improvement in symptoms so as ready for discharge Improvement in symptoms so as ready for discharge   Short Term Goals: Ability to identify changes in lifestyle to reduce recurrence of condition will improve Ability to verbalize feelings will improve Ability to disclose and discuss suicidal ideas Ability to demonstrate self-control will improve Ability to identify and develop effective coping behaviors will  improve Ability to maintain clinical measurements within normal limits will improve Compliance with prescribed medications will improve Ability to identify triggers associated with substance abuse/mental health issues will improve Ability to identify changes in lifestyle to reduce recurrence of condition will improve Ability to demonstrate self-control will improve Ability to identify triggers associated with substance abuse/mental health issues will improve  Medication Management: Evaluate patient's response, side effects, and tolerance of medication regimen.  Therapeutic Interventions: 1 to 1 sessions, Unit Group sessions and Medication administration.  Evaluation of Outcomes: Progressing  Physician Treatment Plan for Secondary Diagnosis: Principal Problem:   Bipolar I disorder, most recent episode depressed, severe with psychotic features ([place]) Active Problems:   Hx of glaucoma   Tobacco use disorder   Amphetamine use disorder, severe (HCC)   Cannabis use disorder, severe, dependence ([place])  Long Term Goal(s): Improvement in symptoms so as ready for discharge Improvement in symptoms so as ready for discharge   Short Term Goals: Ability to identify changes in lifestyle to reduce recurrence of condition will improve Ability to verbalize feelings will improve Ability to disclose and discuss suicidal ideas Ability to demonstrate self-control will improve Ability to identify and develop effective coping behaviors will improve Ability to maintain clinical measurements within normal limits will improve Compliance with prescribed medications will improve Ability to identify triggers associated with substance abuse/mental health issues will improve Ability to identify changes in lifestyle to reduce recurrence of condition will improve Ability to demonstrate self-control will improve Ability to identify triggers associated with substance abuse/mental health issues will improve      Medication Management: Evaluate patient's response, side effects, and tolerance of medication regimen.  Therapeutic Interventions: 1 to 1 sessions, Unit Group sessions and Medication administration.  Evaluation of Outcomes: Progressing   RN Treatment Plan for Primary Diagnosis: Bipolar I disorder, most recent episode depressed, severe with psychotic features ([place]) Long Term Goal(s): Knowledge of disease and therapeutic regimen to maintain health will improve  Short Term Goals: Ability to verbalize feelings will improve, Ability to identify and develop effective coping behaviors will improve and Compliance with prescribed medications will improve  Medication Management: RN will administer medications as ordered by provider, will assess and evaluate patient's response and provide education to patient for prescribed medication. RN will report any adverse and/or side effects to prescribing provider.  Therapeutic Interventions: 1 on 1 counseling sessions, Psychoeducation, Medication administration, Evaluate responses to treatment, Monitor vital signs and CBGs as ordered, Perform/monitor CIWA, COWS, AIMS and Fall Risk screenings as ordered, Perform wound care treatments as ordered.  Evaluation of Outcomes: Progressing   LCSW Treatment Plan for Primary Diagnosis: Bipolar I disorder, most recent episode depressed, severe with psychotic features ([place]) Long Term Goal(s): Safe transition to appropriate next level of care at discharge, Engage patient in therapeutic group addressing interpersonal concerns.  Short Term Goals: Engage patient in aftercare planning with referrals and resources, Facilitate acceptance of mental health diagnosis and concerns and Increase skills for wellness and recovery  Therapeutic Interventions: Assess for  all discharge needs, 1 to 1 time with Education officer, museum, Explore available resources and support systems, Assess for adequacy in community support network, Educate family  and significant other(s) on suicide prevention, Complete Psychosocial Assessment, Interpersonal group therapy.  Evaluation of Outcomes: Progressing   Progress in Treatment: Attending groups: Yes. Participating in groups: Yes. Taking medication as prescribed: Yes. Toleration medication: Yes. Family/Significant other contact made: Yes, individual(s) contacted:  pt's boyfriend Patient understands diagnosis: Yes. Discussing patient identified problems/goals with staff: Yes. Medical problems stabilized or resolved: Yes. Denies suicidal/homicidal ideation: Yes. Issues/concerns per patient self-inventory: No. Other: None at this time.   New problem(s) identified: No, Describe:  none at this time.  New Short Term/Long Term Goal(s): Pt will report decreased depression and increased motivation to complete ADLs by the time of discharge.   Patient Goals:  Pt reported her goal for treatment is, "to develop better coping skills and stay on my mediation."   Discharge Plan or Barriers: Pt will discharge home and will continue tx with [facility].   Reason for Continuation of Hospitalization: Depression Medication stabilization  Estimated Length of Stay: 7 days  Attendees: Patient: Autumn [name] 04/14/2018 11:29 AM  Physician: Dr. Bary Leriche, MD 04/14/2018 11:29 AM  Nursing: Elige Radon, RN 04/14/2018 11:29 AM  RN Care Manager: 04/14/2018 11:29 AM  Social Worker: Alden Hipp, LCSW 04/14/2018 11:29 AM  Recreational Therapist: Roanna Epley, CTRS-LRT 04/14/2018 11:29 AM  Other: Darin Engels, [place] 04/14/2018 11:29 AM  Other: Derrek Gu, LCSW 04/14/2018 11:29 AM  Other: 04/14/2018 11:29 AM      Scribe for Treatment Team: Alden Hipp, LCSW 04/14/2018 11:39 AM

## 2018-04-14 NOTE — [facility] Group Notes (Signed)

## 2018-04-14 NOTE — Progress Notes (Signed)
Received Autumn [name] this AM after breakfast, she was compliant with her medications. She endorsed feeling better related to depression and anxiety. She denied feeling suicidal.

## 2018-04-14 NOTE — Plan of Care (Addendum)
Patient found in bed awake upon my arrival. Patient is neither visible nor social this evening. Patient is isolative to her room throughout the evening. Patient continues to report depression and affect is congruent. She is polite and cooperative. Denies SI/HI/AVH. Denies pain. Appearance is disheveled. Reports eating and voiding adequately. Requests and eats a snack. Compliant with HS medications. Given quetiapine for sleep with positive results. Q 15 minute checks maintained. Will continue to monitor throughout the shift. Patient slept 8.25 hours. No apparent distress. Will endorse care to oncoming shift.  Problem: Education: Goal: Knowledge of the prescribed therapeutic regimen will improve Outcome: Progressing   Problem: Medication: Goal: Compliance with prescribed medication regimen will improve Outcome: Progressing   Problem: Coping: Goal: Coping ability will improve Outcome: Not Progressing Goal: Will verbalize feelings Outcome: Not Progressing

## 2018-04-15 MED ORDER — DIVALPROEX SODIUM 500 MG PO DR TAB
1000.0000 mg | DELAYED_RELEASE_TABLET | Freq: Every day | ORAL | Status: DC
  Administered 2018-04-15 – 2018-04-18 (×4): 1000 mg via ORAL
  Filled 2018-04-15 (×4): qty 2

## 2018-04-15 MED ORDER — DIVALPROEX SODIUM 500 MG PO DR TAB
500.0000 mg | DELAYED_RELEASE_TABLET | Freq: Every day | ORAL | Status: DC
  Administered 2018-04-16 – 2018-04-18 (×3): 500 mg via ORAL
  Filled 2018-04-15 (×3): qty 1

## 2018-04-15 NOTE — B[facility] Group Notes (Signed)
[place] Group Notes:  (Nursing/MHT/Case Management/Adjunct)  Date:  04/15/2018  Time:  9:14 PM  Type of Therapy:  Group Therapy  Participation Level:  Did Not Attend    Barnie Mort 04/15/2018, 9:14 PM

## 2018-04-15 NOTE — Progress Notes (Signed)
[facility] MD Progress Note  04/15/2018 11:13 AM Autumn [name]  MRN:  [id number]  Subjective:   Pt was seen in her room. She is sleepy, but easily arouseable. She reports feeling "ok", still feels sad, and also feels tired. She denied active SI, no hallucinations.  She feels that her morning medicines are too much, and we agreed to reduce AM dose of depakote to 500mg  and increase bedtime depakote to 1000mg .  In addition, she feels that seroquel at bedtime was too sedating as well, stating "I almost walked into the wall". She wants to try without it.   She seems to tolerate Abilify maintena injections, understand the overlapping oral dose of Abilify can be stopped in the near future.   Principal Problem: Bipolar I disorder, most recent episode depressed, severe with psychotic features ([place]) Diagnosis:   Patient Active Problem List   Diagnosis Date Noted  . Bipolar I disorder, most recent episode depressed, severe with psychotic features ([place]) [F31.5] 07/05/2017  . Tobacco use disorder [F17.200] 06/27/2017  . 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 Opioid use disorder, severe, dependence ([place]) [F11.20] 06/27/2017  . Cannabis use disorder, severe, dependence ([place]) [F12.20] 06/27/2017  . Hx of glaucoma [Z86.69] 10/02/2013   Total Time spent with patient: 20 minutes  Past Psychiatric History: bipolar disorder   Past Medical History:  Past Medical History:  Diagnosis Date  . Allergy    Seasonal, Ultram (seizures)  . Anemia 2005  . Bipolar 1 disorder ([place])   . Glaucoma   . Seizures ([place]) 2008    Past Surgical History:  Procedure Laterality Date  . BREAST BIOPSY Right    x 2  . CESAREAN SECTION     x 2   Family History:  Family History  Problem Relation Age of Onset  . Hypertension Mother   . Hyperlipidemia Mother   . Parkinson's disease Father    Family Psychiatric  History: depression Social History:  Social History   Substance and Sexual Activity   Alcohol Use Yes  . Alcohol/week: 2.4 oz  . Types: 4 Cans of beer per week   Comment: Ocassional     Social History   Substance and Sexual Activity  Drug Use Yes  . Types: Marijuana   Comment: percocet -last use over 1 week ago    Social History   Socioeconomic History  . Marital status: Single    Spouse name: Not on file  . Number of children: Not on file  . Years of education: Not on file  . Highest education level: Not on file  Occupational History  . Not on file  Social Needs  . Financial resource strain: Not on file  . Food insecurity:    Worry: Not on file    Inability: Not on file  . Transportation needs:    Medical: Not on file    Non-medical: Not on file  Tobacco Use  . Smoking status: Current Every Day Smoker    Packs/day: 1.00    Years: 9.00    Pack years: 9.00  . Smokeless tobacco: Never Used  . Tobacco comment: Patient not interested  Substance and Sexual Activity  . Alcohol use: Yes    Alcohol/week: 2.4 oz    Types: 4 Cans of beer per week    Comment: Ocassional  . Drug use: Yes    Types: Marijuana    Comment: percocet -last use over 1 week ago  . Sexual activity: Never    Birth  control/protection: None  Lifestyle  . Physical activity:    Days per week: Not on file    Minutes per session: Not on file  . Stress: Not on file  Relationships  . Social connections:    Talks on phone: Not on file    Gets together: Not on file    Attends religious service: Not on file    Active member of club or organization: Not on file    Attends meetings of clubs or organizations: Not on file    Relationship status: Not on file  Other Topics Concern  . Not on file  Social History Narrative  . Not on file   Additional Social History:   Sleep: Good  Appetite:  Fair  Current Medications: Current Facility-Administered Medications  Medication Dose Route Frequency Provider Last Rate Last Dose  . acetaminophen (TYLENOL) tablet 650 mg  650 mg Oral Q6H PRN  [name], [name], MD      . alum & mag hydroxide-simeth (MAALOX/MYLANTA) 200-200-20 MG/5ML suspension 30 mL  30 mL Oral Q4H PRN [name], [name], MD      . ARIPiprazole (ABILIFY) tablet 30 mg  30 mg Oral Daily [name], [name], MD   30 mg at 04/15/18 0800  . ARIPiprazole ER (ABILIFY MAINTENA) injection 400 mg  400 mg Intramuscular Q28 days [name], [name], MD   400 mg at 04/14/18 1749  . divalproex (DEPAKOTE) DR tablet 750 mg  750 mg Oral Q12H [name], [name], MD   750 mg at 04/15/18 0759  . latanoprost (XALATAN) 0.005 % ophthalmic solution 1 drop  1 drop Both Eyes QHS [name], [name], MD   1 drop at 04/14/18 2029  . lithium carbonate (LITHOBID) CR tablet 600 mg  600 mg Oral Q12H [name], [name], MD   600 mg at 04/15/18 0759  . magnesium hydroxide (MILK OF MAGNESIA) suspension 30 mL  30 mL Oral Daily PRN [name], [name], MD      . nicotine (NICODERM CQ - dosed in mg/24 hours) patch 21 mg  21 mg Transdermal Q0600 [name], [name], MD   21 mg at 04/15/18 0601  . QUEtiapine (SEROQUEL) tablet 100 mg  100 mg Oral QHS PRN [name], [name], MD   100 mg at 04/14/18 2029  . venlafaxine XR (EFFEXOR-XR) 24 hr capsule 150 mg  150 mg Oral Q breakfast [name], [name], MD   150 mg at 04/15/18 0759    Lab Results:  No results found for this or any previous visit (from the past 48 hour(s)).  Blood Alcohol level:  Lab Results  Component Value Date   ETH <10 04/11/2018   ETH <10 84/16/6063    Metabolic Disorder Labs: Lab Results  Component Value Date   HGBA1C 4.9 04/13/2018   MPG 93.93 04/13/2018   MPG 91.06 09/02/2017   No results found for: PROLACTIN Lab Results  Component Value Date   CHOL 185 04/13/2018   TRIG 81 04/13/2018   HDL 52 04/13/2018   CHOLHDL 3.6 04/13/2018   VLDL 16 04/13/2018   LDLCALC 117 (H) 04/13/2018   LDLCALC 74 09/02/2017    Physical Findings: AIMS:  , ,  ,  ,    CIWA:    COWS:      Musculoskeletal: Strength & Muscle Tone: within normal limits Gait & Station: normal Patient leans: N/A  Psychiatric Specialty Exam: Physical Exam  Nursing note and vitals reviewed. Psychiatric: Her speech is normal. Her affect is blunt. She  is slowed and withdrawn. Cognition and memory are normal. She expresses impulsivity. She exhibits a depressed mood. She expresses suicidal ideation.    Review of Systems  Neurological: Negative.   Psychiatric/Behavioral: Positive for depression, substance abuse and suicidal ideas.  All other systems reviewed and are negative.   Blood pressure 110/78, pulse 83, temperature 98.4 F (36.9 C), temperature source Oral, resp. rate 18, height 5\' 2"  (1.575 m), weight 44.9 kg (99 lb), SpO2 100 %.Body mass index is 18.11 kg/m.  General Appearance: Casual and Fairly Groomed  Eye Contact:  Good  Speech:  Clear and Coherent and Normal Rate  Volume:  Normal  Mood:  Anxious, Depressed and Hopeless  Affect:  Constricted, Depressed and Flat  Thought Process:  Disorganized, Goal Directed and Descriptions of Associations: Intact  Orientation:  Full (Time, Place, and Person)  Thought Content:  WDL  Suicidal Thoughts:  No  Homicidal Thoughts:  No  Memory:  Immediate;   Good Recent;   Good Remote;   Good  Judgement:  Fair  Insight:  Shallow  Psychomotor Activity:  Psychomotor Retardation  Concentration:  Attention Span: Fair  Recall:  Good  Fund of Knowledge:  Fair  Language:  Fair  Akathisia:  No  Handed:  Left  AIMS (if indicated):     Assets:  Communication Skills Desire for Improvement Financial Resources/Insurance Housing Intimacy Physical Health Resilience Social Support Vocational/Educational  ADL's:  Intact  Cognition:  WNL  Sleep:  Number of Hours: 9    Treatment Plan Summary: Daily contact with patient to assess and evaluate symptoms and progress in treatment and Medication management   Autumn [name] is a 42 year old female with a  history of bipolar disorder admitted for suicidal ideation and worsening depression.  She has been multiple psychotropic medications at fairly high dose, will need to monitor side effects and consolidate if posislble.   #Suicidal ideation, not active.  -patient able to contract for safety in the hospital  #Mood and psychosis, improving (however she is at high risks for polypharmacy).  -continue Effexor 150 mg daily -change Depakote to 500mg  qam and 1oomg qhs to minimize daytime sedation (not change total daily dose), from 750 mg BID, VPA level 91  -continue  Lithium 600 mg BID, level 0.38 -continue Abilify 30 mg daily for now. Plan to stop as Abilify Maintena take into effect.  -Abilify maintena 400 mg every 28 days, on 04/14/18 -DiscontinueSeroquel 100 mg nightly due to oversedation, and avoid concurrent use of 2 antipsychotics if possible.    #Substance abuse -positive for amphetamines and cannabis -minimizes problems and declines treatment -continue to encourage substance abuse counseling.  #Smoking cassation -nicotine patch is available  #Labs -lipid panel, TSH and A1C completed -EKG reviewed, QTc 409 -pregnancy test negative  #Disposition -home with boyfriend -follow up with [facility]    Alia Parsley, MD 04/15/2018, 11:13 AM

## 2018-04-15 NOTE — Plan of Care (Signed)
Thought process improving   working on coping skills . Able tor verbalize feeling  about self. Compliant  with medication  given  . Verbalizing understanding of [place]    Denies suicidal ideations        Problem: Elimination: Goal: Will not experience complications related to bowel motility Outcome: Progressing   Problem: Self-Concept: Goal: Ability to disclose and discuss suicidal ideas will improve Outcome: Progressing   Problem: Education: Goal: Knowledge of [place] [facility] Education information/materials will improve Outcome: Progressing   Problem: Coping: Goal: Coping ability will improve Outcome: Progressing Goal: Will verbalize feelings Outcome: Progressing   Problem: Education: Goal: Utilization of techniques to improve thought processes will improve Outcome: Progressing Goal: Knowledge of the prescribed therapeutic regimen will improve Outcome: Progressing

## 2018-04-15 NOTE — Progress Notes (Signed)
D:Thought process improving   working on Radiographer, therapeutic . Able tor verbalize feeling  about self. Compliant  with medication  given  . Verbalizing understanding of [place]    Denies suicidal ideations     Patient isolate to room . Limited interaction with her peers . Appetite goo  Denies suicidal  homicidal ideations  .  No auditory hallucinations  No pain concerns . Appropriate ADL'S. Interacting with peers and staff.  A: Encourage patient participation with unit programming . Instruction  Given on  Medication , verbalize understanding. R: Voice no other concerns. Staff continue to monitor

## 2018-04-15 NOTE — [facility] Group Notes (Signed)
LCSW Group Therapy Note  04/15/2018 1:15pm  Type of Therapy and Topic: Group Therapy: Holding on to Grudges   Participation Level: Did Not Attend   Description of Group:  In this group patients will be asked to explore and define a grudge. Patients will be guided to discuss their thoughts, feelings, and reasons as to why people have grudges. Patients will process the impact grudges have on daily life and identify thoughts and feelings related to holding grudges. Facilitator will challenge patients to identify ways to let go of grudges and the benefits this provides. Patients will be confronted to address why one struggles letting go of grudges. Lastly, patients will identify feelings and thoughts related to what life would look like without grudges. This group will be process-oriented, with patients participating in exploration of their own experiences, giving and receiving support, and processing challenge from other group members.  Therapeutic Goals:  1. Patient will identify specific grudges related to their personal life.  2. Patient will identify feelings, thoughts, and beliefs around grudges.  3. Patient will identify how one releases grudges appropriately.  4. Patient will identify situations where they could have let go of the grudge, but instead chose to hold on.   Summary of Patient Progress: Pt was invited to attend group but chose not to attend. CSW will continue to encourage pt to attend group throughout their admission.    Therapeutic Modalities:  Cognitive Behavioral Therapy  Solution Focused Therapy  Motivational Interviewing  Brief Therapy   Lakeishia Truluck  CUEBAS-COLON, LCSW 04/15/2018 12:51 PM 

## 2018-04-15 NOTE — Plan of Care (Addendum)
Patient found in bed awake upon my arrival. Patient is neither visible nor social this evening. Patient left her room once to make a phone call. She did not interact with anyone other than myself. Patient is isolative and lying in the dark most of the time. Denies SI/HI/AVH. Continues to report depression and Malaise. Denies pain. Reports eating and voiding adequately. Compliant with HS medications. Polite and gracious. Q 15 minute checks maintained. Will continue to monitor throughout the shift. Patient slept 9 hours. No apparent distress. Will endorse care to oncoming shift.  Problem: Education: Goal: Knowledge of the prescribed therapeutic regimen will improve Outcome: Progressing   Problem: Coping: Goal: Will verbalize feelings Outcome: Progressing   Problem: Medication: Goal: Compliance with prescribed medication regimen will improve Outcome: Progressing   Problem: Self-Concept: Goal: Ability to disclose and discuss suicidal ideas will improve Outcome: Progressing   Problem: Education: Goal: Utilization of techniques to improve thought processes will improve Outcome: Not Progressing   Problem: Coping: Goal: Coping ability will improve Outcome: Not Progressing   Problem: Education: Goal: Knowledge of [place] General Education information/materials will improve Outcome: Not Progressing

## 2018-04-15 NOTE — Plan of Care (Signed)
Patient remains isolative to her room, she remains sad and flat on approach, she had no interaction with peers and minimal interaction with staff. She was medication compliant she spent most of the evening resting in bed quietly.

## 2018-04-16 NOTE — Progress Notes (Signed)
D- Patient alert and oriented. Patient presents in a pleasant mood on assessment stating that she slept "good" last night, with no major complaints at this time. Patient rates her depression/anxiety level a "3/10" stating to this writer that she is feeling this way "about work and if I still have a job". Patient denies SI, HI, AVH, and pain at this time. Patient's goal for today is to "get out of bed more".  A- Scheduled medications administered to patient, per MD orders. Support and encouragement provided.  Routine safety checks conducted every 15 minutes.  Patient informed to notify staff with problems or concerns.  R- No adverse drug reactions noted. Patient contracts for safety at this time. Patient compliant with medications and treatment plan. Patient receptive, calm, and cooperative. Patient interacts well with others on the unit.  Patient remains safe at this time.

## 2018-04-16 NOTE — [facility] Group Notes (Signed)
[facility] Group Notes:  (Nursing/MHT/Case Management/Adjunct)  Date:  04/16/2018  Time:  9:29 PM  Type of Therapy:  Group Therapy  Participation Level:  Did Not Attend   Autumn [name] 04/16/2018, 9:29 PM

## 2018-04-16 NOTE — [facility] Group Notes (Signed)
LCSW Group Therapy Note 04/16/2018 1:15pm  Type of Therapy and Topic: Group Therapy: Feelings Around Returning Home & Establishing a Supportive Framework and Supporting Oneself When Supports Not Available  Participation Level: Did Not Attend  Description of Group:  Patients first processed thoughts and feelings about upcoming discharge. These included fears of upcoming changes, lack of change, new living environments, judgements and expectations from others and overall stigma of mental health issues. The group then discussed the definition of a supportive framework, what that looks and feels like, and how do to discern it from an unhealthy non-supportive network. The group identified different types of supports as well as what to do when your family/friends are less than helpful or unavailable  Therapeutic Goals  1. Patient will identify one healthy supportive network that they can use at discharge. 2. Patient will identify one factor of a supportive framework and how to tell it from an unhealthy network. 3. Patient able to identify one coping skill to use when they do not have positive supports from others. 4. Patient will demonstrate ability to communicate their needs through discussion and/or role plays.  Summary of Patient Progress:  Pt was invited to attend group but chose not to attend. CSW will continue to encourage pt to attend group throughout their admission.   Therapeutic Modalities Cognitive Behavioral Therapy Motivational Interviewing   Lanijah Warzecha  CUEBAS-COLON, LCSW 04/16/2018 9:10 AM

## 2018-04-16 NOTE — Progress Notes (Signed)
[facility] MD Progress Note  04/16/2018 1:21 PM Autumn [name]  MRN:  [id number]  Subjective:  Pt was seen in her room sleeping again, but  easily arouseable. She said that she still feels very tired, did not want to talk much.  She said that she has no problem sleeping last night without seroquel.  She did not want to talk about addiction.   Principal Problem: Bipolar I disorder, most recent episode depressed, severe with psychotic features ([place]) Diagnosis:   Patient Active Problem List   Diagnosis Date Noted  . Bipolar I disorder, most recent episode depressed, severe with psychotic features ([place]) [F31.5] 07/05/2017  . Tobacco use disorder [F17.200] 06/27/2017  . 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 Opioid use disorder, severe, dependence ([place]) [F11.20] 06/27/2017  . Cannabis use disorder, severe, dependence ([place]) [F12.20] 06/27/2017  . Hx of glaucoma [Z86.69] 10/02/2013   Total Time spent with patient: 20 minutes  Past Psychiatric History: bipolar disorder   Past Medical History:  Past Medical History:  Diagnosis Date  . Allergy    Seasonal, Ultram (seizures)  . Anemia 2005  . Bipolar 1 disorder ([place])   . Glaucoma   . Seizures ([place]) 2008    Past Surgical History:  Procedure Laterality Date  . BREAST BIOPSY Right    x 2  . CESAREAN SECTION     x 2   Family History:  Family History  Problem Relation Age of Onset  . Hypertension Mother   . Hyperlipidemia Mother   . Parkinson's disease Father    Family Psychiatric  History: depression Social History:  Social History   Substance and Sexual Activity  Alcohol Use Yes  . Alcohol/week: 2.4 oz  . Types: 4 Cans of beer per week   Comment: Ocassional     Social History   Substance and Sexual Activity  Drug Use Yes  . Types: Marijuana   Comment: percocet -last use over 1 week ago    Social History   Socioeconomic History  . Marital status: Single    Spouse name: Not on file  . Number of  children: Not on file  . Years of education: Not on file  . Highest education level: Not on file  Occupational History  . Not on file  Social Needs  . Financial resource strain: Not on file  . Food insecurity:    Worry: Not on file    Inability: Not on file  . Transportation needs:    Medical: Not on file    Non-medical: Not on file  Tobacco Use  . Smoking status: Current Every Day Smoker    Packs/day: 1.00    Years: 9.00    Pack years: 9.00  . Smokeless tobacco: Never Used  . Tobacco comment: Patient not interested  Substance and Sexual Activity  . Alcohol use: Yes    Alcohol/week: 2.4 oz    Types: 4 Cans of beer per week    Comment: Ocassional  . Drug use: Yes    Types: Marijuana    Comment: percocet -last use over 1 week ago  . Sexual activity: Never    Birth control/protection: None  Lifestyle  . Physical activity:    Days per week: Not on file    Minutes per session: Not on file  . Stress: Not on file  Relationships  . Social connections:    Talks on phone: Not on file    Gets together: Not on file    Attends  religious service: Not on file    Active member of club or organization: Not on file    Attends meetings of clubs or organizations: Not on file    Relationship status: Not on file  Other Topics Concern  . Not on file  Social History Narrative  . Not on file   Additional Social History:   Sleep: Good  Appetite:  Fair  Current Medications: Current Facility-Administered Medications  Medication Dose Route Frequency Provider Last Rate Last Dose  . acetaminophen (TYLENOL) tablet 650 mg  650 mg Oral Q6H PRN [name], [name], MD      . alum & mag hydroxide-simeth (MAALOX/MYLANTA) 200-200-20 MG/5ML suspension 30 mL  30 mL Oral Q4H PRN [name], [name], MD      . ARIPiprazole (ABILIFY) tablet 30 mg  30 mg Oral Daily [name], [name], MD   30 mg at 04/16/18 0833  . ARIPiprazole ER (ABILIFY MAINTENA) injection 400 mg  400 mg Intramuscular Q28  days [name], [name], MD   400 mg at 04/14/18 1749  . divalproex (DEPAKOTE) DR tablet 1,000 mg  1,000 mg Oral QHS Aino Heckert, MD   1,000 mg at 04/15/18 2140  . divalproex (DEPAKOTE) DR tablet 500 mg  500 mg Oral QPC breakfast Aleane Wesenberg, MD   500 mg at 04/16/18 3500  . latanoprost (XALATAN) 0.005 % ophthalmic solution 1 drop  1 drop Both Eyes QHS [name], [name], MD   1 drop at 04/15/18 2142  . lithium carbonate (LITHOBID) CR tablet 600 mg  600 mg Oral Q12H [name], [name], MD   600 mg at 04/16/18 0832  . magnesium hydroxide (MILK OF MAGNESIA) suspension 30 mL  30 mL Oral Daily PRN [name], [name], MD      . nicotine (NICODERM CQ - dosed in mg/24 hours) patch 21 mg  21 mg Transdermal Q0600 [name], [name], MD   21 mg at 04/15/18 0601  . venlafaxine XR (EFFEXOR-XR) 24 hr capsule 150 mg  150 mg Oral Q breakfast [name], [name], MD   150 mg at 04/16/18 9381    Lab Results:  No results found for this or any previous visit (from the past 48 hour(s)).  Blood Alcohol level:  Lab Results  Component Value Date   ETH <10 04/11/2018   ETH <10 82/99/3716    Metabolic Disorder Labs: Lab Results  Component Value Date   HGBA1C 4.9 04/13/2018   MPG 93.93 04/13/2018   MPG 91.06 09/02/2017   No results found for: PROLACTIN Lab Results  Component Value Date   CHOL 185 04/13/2018   TRIG 81 04/13/2018   HDL 52 04/13/2018   CHOLHDL 3.6 04/13/2018   VLDL 16 04/13/2018   LDLCALC 117 (H) 04/13/2018   LDLCALC 74 09/02/2017   Musculoskeletal: Strength & Muscle Tone: within normal limits Gait & Station: normal Patient leans: N/A  Psychiatric Specialty Exam: Physical Exam  Nursing note and vitals reviewed. Psychiatric: Her speech is normal. Her affect is blunt. She is slowed and withdrawn. Cognition and memory are normal. She expresses impulsivity. She exhibits a depressed mood. She expresses suicidal ideation.    Review of Systems  Neurological: Negative.    Psychiatric/Behavioral: Positive for depression, substance abuse and suicidal ideas.  All other systems reviewed and are negative.   Blood pressure 103/83, pulse 86, temperature 98.6 F (37 C), temperature source Oral, resp. rate 18, height 5\' 2"  (1.575 m), weight 44.9 kg (99 lb), SpO2 99 %.Body mass index is  18.11 kg/m.  General Appearance: Casual and Fairly Groomed  Eye Contact:  Fair  Speech:  Clear and Coherent and Slow  Volume:  Normal  Mood:  Depressed  Affect:  Blunt, Constricted and Depressed  Thought Process:  Goal Directed and Descriptions of Associations: Intact  Orientation:  Full (Time, Place, and Person)  Thought Content:  WDL  Suicidal Thoughts:  No  Homicidal Thoughts:  No  Memory:  Immediate;   Good Recent;   Good Remote;   Good  Judgement:  Fair  Insight:  Shallow  Psychomotor Activity:  Psychomotor Retardation  Concentration:  Attention Span: Fair  Recall:  Good  Fund of Knowledge:  Fair  Language:  Fair  Akathisia:  No  Handed:  Left  AIMS (if indicated):     Assets:  Communication Skills Desire for Improvement Financial Resources/Insurance Housing Intimacy Physical Health Resilience Social Support Vocational/Educational  ADL's:  Intact  Cognition:  WNL  Sleep:  Number of Hours: 8    Treatment Plan Summary: Daily contact with patient to assess and evaluate symptoms and progress in treatment and Medication management   Autumn [name] is a 42 year old female with a history of bipolar disorder admitted for suicidal ideation and worsening depression.  She has been multiple psychotropic medications at fairly high dose, and feels a bit oversedated.  Will need to monitor side effects and consolidate if posislble.   #Suicidal ideation, not active.  -patient able to contract for safety in the hospital  #Mood and psychosis, improving (however she is at high risks for polypharmacy).  -continue Effexor 150 mg daily -continue Depakote to 500mg  qam and 1oomg  qhs to minimize daytime sedation (not change total daily dose), from 750 mg BID, VPA level 91  -continue  Lithium 600 mg BID, level 0.38 -continue Abilify 30 mg daily for now. Plan to stop as Abilify Maintena take into effect.  -Abilify maintena 400 mg every 28 days, on 04/14/18 -DiscontinueSeroquel 100 mg nightly due to oversedation, and avoid concurrent use of 2 antipsychotics if possible.   #Substance abuse -positive for amphetamines and cannabis -minimizes problems and declines treatment -continue to encourage substance abuse counseling.  #Smoking cassation -nicotine patch is available  #Labs -lipid panel, TSH and A1C completed -EKG reviewed, QTc 409 -pregnancy test negative  #Disposition -home with boyfriend -follow up with [facility]    Caelan Atchley, MD 04/16/2018, 1:21 PM

## 2018-04-17 MED ORDER — ENSURE ENLIVE PO LIQD
237.0000 mL | Freq: Three times a day (TID) | ORAL | Status: DC
  Administered 2018-04-17 – 2018-04-20 (×12): 237 mL via ORAL

## 2018-04-17 MED ORDER — PANTOPRAZOLE SODIUM 40 MG PO TBEC
40.0000 mg | DELAYED_RELEASE_TABLET | Freq: Every day | ORAL | Status: DC
  Administered 2018-04-17 – 2018-04-21 (×5): 40 mg via ORAL
  Filled 2018-04-17 (×5): qty 1

## 2018-04-17 MED ORDER — LOPERAMIDE HCL 2 MG PO CAPS: 4.0000 mg | ORAL_CAPSULE | ORAL | Status: DC | PRN

## 2018-04-17 NOTE — Plan of Care (Signed)
Taking medications as prescribed. Able to express needs and feelings

## 2018-04-17 NOTE — Progress Notes (Signed)
Recreation Therapy Notes  Date: 04/17/2018  Time: 9:30 am   Location: Craft Room   Behavioral response: N/A   Intervention Topic:  Creative Expressions  Discussion/Intervention: Patient did not attend group.   Clinical Observations/Feedback:  Patient did not attend group.   Eduardo Honor LRT/CTRS        Odes Lolli 04/17/2018 11:08 AM

## 2018-04-17 NOTE — Progress Notes (Signed)
Patient stayed in bed reporting that she is feeling tired. Was able to come to the medication room: alert and oriented and denying suicidal thoughts. Reports that she has not been eating well and concerned about weight loss "I am already tiny, I am afraid I am losing more weight". Requesting ensure for nutrition enhancement.  Patient received HS medications and returned to bed, reporting that "I just want to sleep". Was encouraged to call staff as needed. Has been sleeping and staff continue to monitor.

## 2018-04-17 NOTE — Plan of Care (Signed)
Patient stayed in bed most of the day.States "I am not depressed.I am just thinking about my job and stuff."Denies SI,HI and AVH.Appetite and energy level good.Did not attend groups.Went outside at the courtyard.support and encouragement given.

## 2018-04-17 NOTE — [facility] Group Notes (Signed)
[facility] Group Notes:  (Nursing/MHT/Case Management/Adjunct)  Date:  04/17/2018  Time:  9:22 PM  Type of Therapy:  Group Therapy  Participation Level:  Did Not Attend   Bobby Rumpf 04/17/2018, 9:22 PM

## 2018-04-17 NOTE — Progress Notes (Signed)
[facility] MD Progress Note  04/17/2018 4:57 PM Autumn [name]  MRN:  [id number]  Subjective:   Autumn [name] still feels depressed, hopeless, tired and sluggish. She hardly gets out of bed. Tolerates medications well. No other somatic complaints but appetite is poor and she thinks she has been loosing weight since admission.  Principal Problem: Bipolar I disorder, most recent episode depressed, severe with psychotic features ([place]) Diagnosis:   Patient Active Problem List   Diagnosis Date Noted  . Bipolar I disorder, most recent episode depressed, severe with psychotic features ([place]) [F31.5] 07/05/2017    Priority: High  . Tobacco use disorder [F17.200] 06/27/2017  . 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 Opioid use disorder, severe, dependence ([place]) [F11.20] 06/27/2017  . Cannabis use disorder, severe, dependence ([place]) [F12.20] 06/27/2017  . Hx of glaucoma [Z86.69] 10/02/2013   Total Time spent with patient: 20 minutes  Past Psychiatric History: bipolar disorder  Past Medical History:  Past Medical History:  Diagnosis Date  . Allergy    Seasonal, Ultram (seizures)  . Anemia 2005  . Bipolar 1 disorder ([place])   . Glaucoma   . Seizures ([place]) 2008    Past Surgical History:  Procedure Laterality Date  . BREAST BIOPSY Right    x 2  . CESAREAN SECTION     x 2   Family History:  Family History  Problem Relation Age of Onset  . Hypertension Mother   . Hyperlipidemia Mother   . Parkinson's disease Father    Family Psychiatric  History: none Social History:  Social History   Substance and Sexual Activity  Alcohol Use Yes  . Alcohol/week: 2.4 oz  . Types: 4 Cans of beer per week   Comment: Ocassional     Social History   Substance and Sexual Activity  Drug Use Yes  . Types: Marijuana   Comment: percocet -last use over 1 week ago    Social History   Socioeconomic History  . Marital status: Single    Spouse name: Not on file  . Number of  children: Not on file  . Years of education: Not on file  . Highest education level: Not on file  Occupational History  . Not on file  Social Needs  . Financial resource strain: Not on file  . Food insecurity:    Worry: Not on file    Inability: Not on file  . Transportation needs:    Medical: Not on file    Non-medical: Not on file  Tobacco Use  . Smoking status: Current Every Day Smoker    Packs/day: 1.00    Years: 9.00    Pack years: 9.00  . Smokeless tobacco: Never Used  . Tobacco comment: Patient not interested  Substance and Sexual Activity  . Alcohol use: Yes    Alcohol/week: 2.4 oz    Types: 4 Cans of beer per week    Comment: Ocassional  . Drug use: Yes    Types: Marijuana    Comment: percocet -last use over 1 week ago  . Sexual activity: Never    Birth control/protection: None  Lifestyle  . Physical activity:    Days per week: Not on file    Minutes per session: Not on file  . Stress: Not on file  Relationships  . Social connections:    Talks on phone: Not on file    Gets together: Not on file    Attends religious service: Not on file  Active member of club or organization: Not on file    Attends meetings of clubs or organizations: Not on file    Relationship status: Not on file  Other Topics Concern  . Not on file  Social History Narrative  . Not on file   Additional Social History:                         Sleep: Fair  Appetite:  Poor  Current Medications: Current Facility-Administered Medications  Medication Dose Route Frequency Provider Last Rate Last Dose  . acetaminophen (TYLENOL) tablet 650 mg  650 mg Oral Q6H PRN [name], [name], MD      . alum & mag hydroxide-simeth (MAALOX/MYLANTA) 200-200-20 MG/5ML suspension 30 mL  30 mL Oral Q4H PRN [name], [name], MD      . ARIPiprazole (ABILIFY) tablet 30 mg  30 mg Oral Daily [name], [name], MD   30 mg at 04/17/18 0849  . ARIPiprazole ER (ABILIFY MAINTENA) injection  400 mg  400 mg Intramuscular Q28 days [name], [name], MD   400 mg at 04/14/18 1749  . divalproex (DEPAKOTE) DR tablet 1,000 mg  1,000 mg Oral QHS He, [name], MD   1,000 mg at 04/16/18 2209  . divalproex (DEPAKOTE) DR tablet 500 mg  500 mg Oral QPC breakfast He, [name], MD   500 mg at 04/17/18 0849  . feeding supplement (ENSURE ENLIVE) (ENSURE ENLIVE) liquid 237 mL  237 mL Oral TID BM [name], [name], MD   237 mL at 04/17/18 1408  . latanoprost (XALATAN) 0.005 % ophthalmic solution 1 drop  1 drop Both Eyes QHS [name], [name], MD   1 drop at 04/16/18 2212  . lithium carbonate (LITHOBID) CR tablet 600 mg  600 mg Oral Q12H [name], [name], MD   600 mg at 04/17/18 0849  . magnesium hydroxide (MILK OF MAGNESIA) suspension 30 mL  30 mL Oral Daily PRN [name], [name], MD      . nicotine (NICODERM CQ - dosed in mg/24 hours) patch 21 mg  21 mg Transdermal Q0600 [name], [name], MD   21 mg at 04/17/18 0850  . venlafaxine XR (EFFEXOR-XR) 24 hr capsule 150 mg  150 mg Oral Q breakfast [name], [name], MD   150 mg at 04/17/18 0849    Lab Results: No results found for this or any previous visit (from the past 48 hour(s)).  Blood Alcohol level:  Lab Results  Component Value Date   ETH <10 04/11/2018   ETH <10 17/40/8144    Metabolic Disorder Labs: Lab Results  Component Value Date   HGBA1C 4.9 04/13/2018   MPG 93.93 04/13/2018   MPG 91.06 09/02/2017   No results found for: PROLACTIN Lab Results  Component Value Date   CHOL 185 04/13/2018   TRIG 81 04/13/2018   HDL 52 04/13/2018   CHOLHDL 3.6 04/13/2018   VLDL 16 04/13/2018   LDLCALC 117 (H) 04/13/2018   LDLCALC 74 09/02/2017    Physical Findings: AIMS:  , ,  ,  ,    CIWA:    COWS:     Musculoskeletal: Strength & Muscle Tone: within normal limits Gait & Station: normal Patient leans: N/A  Psychiatric Specialty Exam: Physical Exam  Nursing note and vitals reviewed. Psychiatric: Her speech is  normal. Thought content normal. Her affect is blunt. She is slowed and withdrawn. Cognition and memory are normal. She expresses impulsivity. She exhibits a depressed  mood.    Review of Systems  Neurological: Negative.   Psychiatric/Behavioral: Positive for depression and substance abuse.  All other systems reviewed and are negative.   Blood pressure 102/80, pulse 86, temperature 98.7 F (37.1 C), temperature source Oral, resp. rate 18, height 5\' 2"  (1.575 m), weight 44.9 kg (99 lb), SpO2 98 %.Body mass index is 18.11 kg/m.  General Appearance: Casual  Eye Contact:  Good  Speech:  Clear and Coherent  Volume:  Normal  Mood:  Depressed  Affect:  Flat  Thought Process:  Goal Directed and Descriptions of Associations: Intact  Orientation:  Full (Time, Place, and Person)  Thought Content:  WDL  Suicidal Thoughts:  No  Homicidal Thoughts:  No  Memory:  Immediate;   Fair Recent;   Fair Remote;   Fair  Judgement:  Poor  Insight:  Shallow  Psychomotor Activity:  Psychomotor Retardation  Concentration:  Concentration: Fair and Attention Span: Fair  Recall:  [employer] of Knowledge:  Fair  Language:  Fair  Akathisia:  No  Handed:  Right  AIMS (if indicated):     Assets:  Communication Skills Desire for Improvement Financial Resources/Insurance Housing Intimacy Physical Health Resilience Social Support  ADL's:  Intact  Cognition:  WNL  Sleep:  Number of Hours: 6.5     Treatment Plan Summary: Daily contact with patient to assess and evaluate symptoms and progress in treatment and Medication management   Ms. Grams is a 42 year old female with a history of bipolar disorder admitted for suicidal ideationand worsening depression.    #Suicidal ideation, resolved  -patient able to contract for safety in the hospital  #Mood and psychosis, improving very slowly -continue Effexor 150 mg daily -continue Depakote to 500mg  qam and 1000 mg nightly, VPA level 91  -continue  Lithium  600 mg BID, level 0.38 -continue Abilify 30 mg daily for now. Plan to stop as Abilify Maintena take into effect.  -Abilify maintena 400 mg every 28 days, on 04/14/18  #Weight loss -Ensure TID  #Substance abuse -positive for amphetamines and cannabis -minimizes problems and declines treatment -continue to encourage substance abuse counseling.  #Smoking cassation -nicotine patch is available  #Labs -lipid panel, TSH and A1C completed -EKG reviewed, QTc 409 -pregnancy testnegative  #Disposition -home with boyfriend -follow up with [facility]     Orson Slick, MD 04/17/2018, 4:57 PM

## 2018-04-18 MED ORDER — ARIPIPRAZOLE 10 MG PO TABS
10.0000 mg | ORAL_TABLET | Freq: Every day | ORAL | Status: DC
  Administered 2018-04-19 – 2018-04-21 (×3): 10 mg via ORAL
  Filled 2018-04-18 (×3): qty 1

## 2018-04-18 MED ORDER — PROCHLORPERAZINE MALEATE 10 MG PO TABS
10.0000 mg | ORAL_TABLET | Freq: Four times a day (QID) | ORAL | Status: DC | PRN
  Administered 2018-04-20: 10 mg via ORAL
  Filled 2018-04-18: qty 1

## 2018-04-18 NOTE — [facility] Group Notes (Signed)
04/18/2018 1PM  Type of Therapy/Topic:  Group Therapy:  Feelings about Diagnosis  Participation Level:  Did Not Attend   Description of Group:   This group will allow patients to explore their thoughts and feelings about diagnoses they have received. Patients will be guided to explore their level of understanding and acceptance of these diagnoses. Facilitator will encourage patients to process their thoughts and feelings about the reactions of others to their diagnosis and will guide patients in identifying ways to discuss their diagnosis with significant others in their lives. This group will be process-oriented, with patients participating in exploration of their own experiences, giving and receiving support, and processing challenge from other group members.   Therapeutic Goals: 1. Patient will demonstrate understanding of diagnosis as evidenced by identifying two or more symptoms of the disorder 2. Patient will be able to express two feelings regarding the diagnosis 3. Patient will demonstrate their ability to communicate their needs through discussion and/or role play  Summary of Patient Progress: Patient was encouraged and invited to attend group. Patient did not attend group. Social worker will continue to encourage group participation in the future.        Therapeutic Modalities:   Cognitive Behavioral Therapy Brief Therapy Feelings Identification    Darin Engels, [place] 04/18/2018 2:06 PM

## 2018-04-18 NOTE — [facility] Group Notes (Signed)
CSW Group Therapy Note  04/18/2018  Time:  0900  Type of Therapy and Topic: Group Therapy: Goals Group: SMART Goals    Participation Level:  Did Not Attend    Description of Group:   The purpose of a daily goals group is to assist and guide patients in setting recovery/wellness-related goals. The objective is to set goals as they relate to the crisis in which they were admitted. Patients will be using SMART goal modalities to set measurable goals. Characteristics of realistic goals will be discussed and patients will be assisted in setting and processing how one will reach their goal. Facilitator will also assist patients in applying interventions and coping skills learned in psycho-education groups to the SMART goal and process how one will achieve defined goal.    Therapeutic Goals:  -Patients will develop and document one goal related to or their crisis in which brought them into treatment.  -Patients will be guided by LCSW using SMART goal setting modality in how to set a measurable, attainable, realistic and time sensitive goal.  -Patients will process barriers in reaching goal.  -Patients will process interventions in how to overcome and successful in reaching goal.    Patient's Goal:   Pt was invited to attend group but chose not to attend. CSW will continue to encourage pt to attend group throughout their admission.   Therapeutic Modalities:  Motivational Interviewing  Cognitive Behavioral Therapy  Crisis Intervention Model  SMART goals setting  Alden Hipp, MSW, LCSW Clinical Social Worker 04/18/2018 9:32 AM

## 2018-04-18 NOTE — Progress Notes (Signed)
[facility] MD Progress Note  04/18/2018 10:02 AM Autumn [name]  MRN:  [id number]  Subjective:    Autumn [name] has been in the hospital for 6 days and there is minimal improvement. She is no longer suicidal but still depressed and prostrated. She gets out of bed for meds only. There is severe psychomotor retardation. She does not participate in programming. She complains of abdominal pain and diarrhea that started 1 week prior to admission. This was helped with Loperamid. We also started Protonix. This morning, she complains of nausea. I will start Compazine and lower oral Abilify. Will rechecl Li and VPA level.  This psychomotor retardation was interpreted as sedation from medications over the weekend and her medications have been adjusted. Unfortunately, the patient suffers severe treatment resistant bipolar. Once sick, she takes weeks and months to recover.  Principal Problem: Bipolar I disorder, most recent episode depressed, severe with psychotic features ([place]) Diagnosis:   Patient Active Problem List   Diagnosis Date Noted  . Bipolar I disorder, most recent episode depressed, severe with psychotic features ([place]) [F31.5] 07/05/2017    Priority: High  . Tobacco use disorder [F17.200] 06/27/2017  . 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 Opioid use disorder, severe, dependence ([place]) [F11.20] 06/27/2017  . Cannabis use disorder, severe, dependence ([place]) [F12.20] 06/27/2017  . Hx of glaucoma [Z86.69] 10/02/2013   Total Time spent with patient: 20 minutes  Past Psychiatric History: bipolar disorder  Past Medical History:  Past Medical History:  Diagnosis Date  . Allergy    Seasonal, Ultram (seizures)  . Anemia 2005  . Bipolar 1 disorder ([place])   . Glaucoma   . Seizures ([place]) 2008    Past Surgical History:  Procedure Laterality Date  . BREAST BIOPSY Right    x 2  . CESAREAN SECTION     x 2   Family History:  Family History  Problem Relation Age of Onset  .  Hypertension Mother   . Hyperlipidemia Mother   . Parkinson's disease Father    Family Psychiatric  History: depression Social History:  Social History   Substance and Sexual Activity  Alcohol Use Yes  . Alcohol/week: 2.4 oz  . Types: 4 Cans of beer per week   Comment: Ocassional     Social History   Substance and Sexual Activity  Drug Use Yes  . Types: Marijuana   Comment: percocet -last use over 1 week ago    Social History   Socioeconomic History  . Marital status: Single    Spouse name: Not on file  . Number of children: Not on file  . Years of education: Not on file  . Highest education level: Not on file  Occupational History  . Not on file  Social Needs  . Financial resource strain: Not on file  . Food insecurity:    Worry: Not on file    Inability: Not on file  . Transportation needs:    Medical: Not on file    Non-medical: Not on file  Tobacco Use  . Smoking status: Current Every Day Smoker    Packs/day: 1.00    Years: 9.00    Pack years: 9.00  . Smokeless tobacco: Never Used  . Tobacco comment: Patient not interested  Substance and Sexual Activity  . Alcohol use: Yes    Alcohol/week: 2.4 oz    Types: 4 Cans of beer per week    Comment: Ocassional  . Drug use: Yes    Types:  Marijuana    Comment: percocet -last use over 1 week ago  . Sexual activity: Never    Birth control/protection: None  Lifestyle  . Physical activity:    Days per week: Not on file    Minutes per session: Not on file  . Stress: Not on file  Relationships  . Social connections:    Talks on phone: Not on file    Gets together: Not on file    Attends religious service: Not on file    Active member of club or organization: Not on file    Attends meetings of clubs or organizations: Not on file    Relationship status: Not on file  Other Topics Concern  . Not on file  Social History Narrative  . Not on file   Additional Social History:                          Sleep: Fair  Appetite:  Poor  Current Medications: Current Facility-Administered Medications  Medication Dose Route Frequency Provider Last Rate Last Dose  . acetaminophen (TYLENOL) tablet 650 mg  650 mg Oral Q6H PRN Shanae Luo B, MD      . alum & mag hydroxide-simeth (MAALOX/MYLANTA) 200-200-20 MG/5ML suspension 30 mL  30 mL Oral Q4H PRN Jamyson Jirak B, MD      . Derrill Memo ON 04/19/2018] ARIPiprazole (ABILIFY) tablet 10 mg  10 mg Oral Daily Arie Gable B, MD      . ARIPiprazole ER (ABILIFY MAINTENA) injection 400 mg  400 mg Intramuscular Q28 days Kaylor Maiers B, MD   400 mg at 04/14/18 1749  . divalproex (DEPAKOTE) DR tablet 1,000 mg  1,000 mg Oral QHS He, [name], MD   1,000 mg at 04/17/18 1610  . divalproex (DEPAKOTE) DR tablet 500 mg  500 mg Oral QPC breakfast He, [name], MD   500 mg at 04/18/18 0817  . feeding supplement (ENSURE ENLIVE) (ENSURE ENLIVE) liquid 237 mL  237 mL Oral TID BM Uziah Sorter B, MD   237 mL at 04/17/18 2153  . latanoprost (XALATAN) 0.005 % ophthalmic solution 1 drop  1 drop Both Eyes QHS Leiani Enright B, MD   1 drop at 04/17/18 2156  . lithium carbonate (LITHOBID) CR tablet 600 mg  600 mg Oral Q12H Willoughby Doell B, MD   600 mg at 04/18/18 0817  . loperamide (IMODIUM) capsule 4 mg  4 mg Oral PRN Timeka Goette B, MD      . magnesium hydroxide (MILK OF MAGNESIA) suspension 30 mL  30 mL Oral Daily PRN Royale Lennartz B, MD      . nicotine (NICODERM CQ - dosed in mg/24 hours) patch 21 mg  21 mg Transdermal Q0600 Hoorain Kozakiewicz B, MD   21 mg at 04/18/18 0654  . pantoprazole (PROTONIX) EC tablet 40 mg  40 mg Oral Daily Karole Oo B, MD   40 mg at 04/18/18 0817  . prochlorperazine (COMPAZINE) tablet 10 mg  10 mg Oral Q6H PRN Margeret Stachnik B, MD      . venlafaxine XR (EFFEXOR-XR) 24 hr capsule 150 mg  150 mg Oral Q breakfast Agueda Houpt B, MD   150 mg at 04/18/18 9604    Lab Results: No results  found for this or any previous visit (from the past 48 hour(s)).  Blood Alcohol level:  Lab Results  Component Value Date   ETH <10 04/11/2018   ETH <10 54/07/8118    Metabolic  Disorder Labs: Lab Results  Component Value Date   HGBA1C 4.9 04/13/2018   MPG 93.93 04/13/2018   MPG 91.06 09/02/2017   No results found for: PROLACTIN Lab Results  Component Value Date   CHOL 185 04/13/2018   TRIG 81 04/13/2018   HDL 52 04/13/2018   CHOLHDL 3.6 04/13/2018   VLDL 16 04/13/2018   LDLCALC 117 (H) 04/13/2018   LDLCALC 74 09/02/2017    Physical Findings: AIMS:  , ,  ,  ,    CIWA:    COWS:     Musculoskeletal: Strength & Muscle Tone: within normal limits Gait & Station: normal Patient leans: N/A  Psychiatric Specialty Exam: Physical Exam  Nursing note and vitals reviewed. Psychiatric: Thought content normal. Her affect is blunt. Her speech is delayed. She is slowed and withdrawn. Cognition and memory are normal. She expresses impulsivity. She exhibits a depressed mood.    Review of Systems  Gastrointestinal: Positive for diarrhea and nausea.  Neurological: Negative.   Psychiatric/Behavioral: Positive for depression and substance abuse.  All other systems reviewed and are negative.   Blood pressure 108/80, pulse 92, temperature 98.6 F (37 C), temperature source Oral, resp. rate 16, height 5\' 2"  (1.575 m), weight 44.9 kg (99 lb), SpO2 97 %.Body mass index is 18.11 kg/m.  General Appearance: Casual  Eye Contact:  Minimal  Speech:  Slow  Volume:  Decreased  Mood:  Depressed and Hopeless  Affect:  Flat  Thought Process:  Goal Directed and Descriptions of Associations: Intact  Orientation:  Full (Time, Place, and Person)  Thought Content:  WDL  Suicidal Thoughts:  No  Homicidal Thoughts:  No  Memory:  Immediate;   Fair Recent;   Fair Remote;   Fair  Judgement:  Impaired  Insight:  Shallow  Psychomotor Activity:  Psychomotor Retardation  Concentration:  Concentration:  Fair and Attention Span: Fair  Recall:  [employer] of Knowledge:  Fair  Language:  Fair  Akathisia:  No  Handed:  Left  AIMS (if indicated):     Assets:  Communication Skills Desire for Improvement Financial Resources/Insurance Housing Intimacy Physical Health Resilience Social Support  ADL's:  Intact  Cognition:  WNL  Sleep:  Number of Hours: 7.5     Treatment Plan Summary: Daily contact with patient to assess and evaluate symptoms and progress in treatment and Medication management   Autumn [name] is a 42 year old female with a history of bipolar disorder admitted for suicidal ideationand worsening depression. She has not been improving.  #Suicidal ideation, resolved  -patient able to contract for safety in the hospital  #Mood and psychosis, not improving  -continue Effexor 150 mg daily -continueDepakote to 500 mg qam and 1000 mg nightly, VPA level 91  -continue Lithium 600 mg BID, level 0.38 -lower Abilify to 10 mg daily   -Abilify maintena 400 mg every 28 days, next dose on 05/12/2018  #Weight loss -Ensure TID  #Abdominal pain with diarrhea and nausea -Protonix 40 mg daily -Loperamid 4 mg PRN -Compazine 10 mg PRN  #Substance abuse -positive for amphetamines and cannabis -minimizes problems and declines treatment -continue to encourage substance abuse counseling.  #Smoking cassation -nicotine patch is available  #Labs -lipid panel, TSH and A1C completed -EKG reviewed, QTc 409 -pregnancy testnegative  #Disposition -home with boyfriend -follow up with [facility]     Orson Slick, MD 04/18/2018, 10:02 AM

## 2018-04-18 NOTE — Plan of Care (Addendum)
Patient found in bed awake upon my arrival. Patient is more visible this evening but not social. Patient denies all complaints including SI/HI/AVH. Denies depression but affect is incongruent. Denies pain. Reports eating and voiding adequately. Compliant with HS medications and staff direction. Q 15 minute checks maintained. Will continue to monitor throughout the shift. Patient slept 6 hours. No apparent distress. Compliant with lab draw. Will endorse care to oncoming shift.  Problem: Education: Goal: Knowledge of the prescribed therapeutic regimen will improve Outcome: Progressing   Problem: Coping: Goal: Coping ability will improve Outcome: Progressing Goal: Will verbalize feelings Outcome: Progressing   Problem: Medication: Goal: Compliance with prescribed medication regimen will improve Outcome: Progressing

## 2018-04-18 NOTE — Progress Notes (Signed)
Recreation Therapy Notes  Date: 04/18/2018  Time: 9:30 am   Location: Craft Room   Behavioral response: N/A   Intervention Topic:  Stress  Discussion/Intervention: Patient did not attend group.   Clinical Observations/Feedback:  Patient did not attend group.   Joylynn Defrancesco LRT/CTRS        [name] 04/18/2018 10:45 AM

## 2018-04-18 NOTE — Plan of Care (Signed)
Denies suicidal ideations Thought process improving   working on Radiographer, therapeutic . Able tor verbalize feeling  about self. Compliant  with medication  given  . Verbalizing understanding of [place] Information      Problem: Education: Goal: Utilization of techniques to improve thought processes will improve Outcome: Progressing Goal: Knowledge of the prescribed therapeutic regimen will improve Outcome: Progressing   Problem: Coping: Goal: Coping ability will improve Outcome: Progressing Goal: Will verbalize feelings Outcome: Progressing   Problem: Education: Goal: Knowledge of [place] [facility] Education information/materials will improve Outcome: Progressing   Problem: Medication: Goal: Compliance with prescribed medication regimen will improve Outcome: Progressing   Problem: Self-Concept: Goal: Ability to disclose and discuss suicidal ideas will improve Outcome: Progressing

## 2018-04-18 NOTE — Plan of Care (Signed)
Patient is calm and cooperating with her therapeutic regimen, isolative to her room, contract for safety state depression is 6/10 denies SI/HI and signs of AVH noted, 15 minutes safety rounding is maintained. Problem: Education: Goal: Utilization of techniques to improve thought processes will improve Outcome: Progressing Goal: Knowledge of the prescribed therapeutic regimen will improve Outcome: Progressing   Problem: Coping: Goal: Coping ability will improve Outcome: Progressing Goal: Will verbalize feelings Outcome: Progressing   Problem: Education: Goal: Knowledge of [place] [facility] Education information/materials will improve Outcome: Progressing   Problem: Medication: Goal: Compliance with prescribed medication regimen will improve Outcome: Progressing   Problem: Self-Concept: Goal: Ability to disclose and discuss suicidal ideas will improve Outcome: Progressing   Problem: Elimination: Goal: Will not experience complications related to bowel motility Outcome: Progressing

## 2018-04-18 NOTE — Progress Notes (Signed)
D: Patient stated slept good last night .Stated appetite fair and energy level low. Stated concentration is good . Stated on Depression scale 2 , hopeless 2 and anxiety 0 .( low 0-10 high) Denies suicidal  homicidal ideations  Denies auditory hallucinations  No pain concerns . Appropriate ADL'S. Interacting with peers and staff.  Thought process improving   working on coping skills . Able tor verbalize feeling  about self. Compliant  with medication  given  . Verbalizing understanding of [place]    Denies suicidal ideations   A: Encourage patient participation with unit programming . Instruction  Given on  Medication , verbalize understanding.  R: Voice no other concerns. Staff continue to monitor

## 2018-04-19 LAB — CBC
HCT: 43.8 % (ref 35.0–47.0)
Hemoglobin: 15.1 g/dL (ref 12.0–16.0)
MCH: 31.4 pg (ref 26.0–34.0)
MCHC: 34.4 g/dL (ref 32.0–36.0)
MCV: 91.3 fL (ref 80.0–100.0)
PLATELETS: 164 10*3/uL (ref 150–440)
RBC: 4.8 MIL/uL (ref 3.80–5.20)
RDW: 12.9 % (ref 11.5–14.5)
WBC: 10.7 10*3/uL (ref 3.6–11.0)

## 2018-04-19 LAB — COMPREHENSIVE METABOLIC PANEL
ALT: 7 U/L — AB (ref 14–54)
AST: 10 U/L — AB (ref 15–41)
Albumin: 4.4 g/dL (ref 3.5–5.0)
Alkaline Phosphatase: 48 U/L (ref 38–126)
Anion gap: 9 (ref 5–15)
BUN: 15 mg/dL (ref 6–20)
CHLORIDE: 102 mmol/L (ref 101–111)
CO2: 25 mmol/L (ref 22–32)
CREATININE: 0.56 mg/dL (ref 0.44–1.00)
Calcium: 9.2 mg/dL (ref 8.9–10.3)
GFR calc Af Amer: >60 mL/min (ref >60)
GFR calc non Af Amer: >60 mL/min (ref >60)
Glucose, Bld: 86 mg/dL (ref 65–99)
Potassium: 3.9 mmol/L (ref 3.5–5.1)
Sodium: 136 mmol/L (ref 135–145)
Total Bilirubin: 0.6 mg/dL (ref 0.3–1.2)
Total Protein: 6.9 g/dL (ref 6.5–8.1)

## 2018-04-19 LAB — VALPROIC ACID LEVEL: VALPROIC ACID LVL: 196 ug/mL — AB (ref 50.0–100.0)

## 2018-04-19 LAB — LITHIUM LEVEL: Lithium Lvl: 1.1 mmol/L (ref 0.60–1.20)

## 2018-04-19 NOTE — Progress Notes (Signed)
[facility] MD Progress Note  04/19/2018 9:15 AM Autumn [name]  MRN:  [id number]  Subjective:    Autumn [name] reports feeling better today in spite of sleepless night and elevated level of VPA this morning of 196. Li was 1.1. We will hold Lithium and Depakote today. Recheck Li, VPA and ammonia level in am.  The patient was more animated last night but still did not participate in programing. This is her goal for today. Insomnia is a problem again. This was addressed with Seroquel in the past but Seroquel was discontinued over the weekend. The patient works second shift and ususally sleeps in late.   Principal Problem: Bipolar I disorder, most recent episode depressed, severe with psychotic features ([place]) Diagnosis:   Patient Active Problem List   Diagnosis Date Noted  . Bipolar I disorder, most recent episode depressed, severe with psychotic features ([place]) [F31.5] 07/05/2017    Priority: High  . Tobacco use disorder [F17.200] 06/27/2017  . 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 Opioid use disorder, severe, dependence ([place]) [F11.20] 06/27/2017  . Cannabis use disorder, severe, dependence ([place]) [F12.20] 06/27/2017  . Hx of glaucoma [Z86.69] 10/02/2013   Total Time spent with patient: 20 minutes  Past Psychiatric History: bipolar disorder  Past Medical History:  Past Medical History:  Diagnosis Date  . Allergy    Seasonal, Ultram (seizures)  . Anemia 2005  . Bipolar 1 disorder ([place])   . Glaucoma   . Seizures ([place]) 2008    Past Surgical History:  Procedure Laterality Date  . BREAST BIOPSY Right    x 2  . CESAREAN SECTION     x 2   Family History:  Family History  Problem Relation Age of Onset  . Hypertension Mother   . Hyperlipidemia Mother   . Parkinson's disease Father    Family Psychiatric  History: depression Social History:  Social History   Substance and Sexual Activity  Alcohol Use Yes  . Alcohol/week: 2.4 oz  . Types: 4 Cans of beer per  week   Comment: Ocassional     Social History   Substance and Sexual Activity  Drug Use Yes  . Types: Marijuana   Comment: percocet -last use over 1 week ago    Social History   Socioeconomic History  . Marital status: Single    Spouse name: Not on file  . Number of children: Not on file  . Years of education: Not on file  . Highest education level: Not on file  Occupational History  . Not on file  Social Needs  . Financial resource strain: Not on file  . Food insecurity:    Worry: Not on file    Inability: Not on file  . Transportation needs:    Medical: Not on file    Non-medical: Not on file  Tobacco Use  . Smoking status: Current Every Day Smoker    Packs/day: 1.00    Years: 9.00    Pack years: 9.00  . Smokeless tobacco: Never Used  . Tobacco comment: Patient not interested  Substance and Sexual Activity  . Alcohol use: Yes    Alcohol/week: 2.4 oz    Types: 4 Cans of beer per week    Comment: Ocassional  . Drug use: Yes    Types: Marijuana    Comment: percocet -last use over 1 week ago  . Sexual activity: Never    Birth control/protection: None  Lifestyle  . Physical activity:    Days  per week: Not on file    Minutes per session: Not on file  . Stress: Not on file  Relationships  . Social connections:    Talks on phone: Not on file    Gets together: Not on file    Attends religious service: Not on file    Active member of club or organization: Not on file    Attends meetings of clubs or organizations: Not on file    Relationship status: Not on file  Other Topics Concern  . Not on file  Social History Narrative  . Not on file   Additional Social History:                         Sleep: Poor  Appetite:  Poor  Current Medications: Current Facility-Administered Medications  Medication Dose Route Frequency Provider Last Rate Last Dose  . acetaminophen (TYLENOL) tablet 650 mg  650 mg Oral Q6H PRN Jibreel Fedewa B, MD      . alum &  mag hydroxide-simeth (MAALOX/MYLANTA) 200-200-20 MG/5ML suspension 30 mL  30 mL Oral Q4H PRN Celia Gibbons B, MD      . ARIPiprazole (ABILIFY) tablet 10 mg  10 mg Oral Daily Mohmed Farver B, MD   10 mg at 04/19/18 0906  . ARIPiprazole ER (ABILIFY MAINTENA) injection 400 mg  400 mg Intramuscular Q28 days Ashelyn Mccravy B, MD   400 mg at 04/14/18 1749  . feeding supplement (ENSURE ENLIVE) (ENSURE ENLIVE) liquid 237 mL  237 mL Oral TID BM Shaquile Lutze B, MD   237 mL at 04/18/18 2107  . latanoprost (XALATAN) 0.005 % ophthalmic solution 1 drop  1 drop Both Eyes QHS Mayeli Bornhorst B, MD   1 drop at 04/18/18 2105  . loperamide (IMODIUM) capsule 4 mg  4 mg Oral PRN Catharine Kettlewell B, MD      . magnesium hydroxide (MILK OF MAGNESIA) suspension 30 mL  30 mL Oral Daily PRN Everleigh Colclasure B, MD      . nicotine (NICODERM CQ - dosed in mg/24 hours) patch 21 mg  21 mg Transdermal Q0600 Latifa Noble B, MD   21 mg at 04/19/18 0614  . pantoprazole (PROTONIX) EC tablet 40 mg  40 mg Oral Daily Tamaria Dunleavy B, MD   40 mg at 04/19/18 0907  . prochlorperazine (COMPAZINE) tablet 10 mg  10 mg Oral Q6H PRN Katreena Schupp B, MD      . venlafaxine XR (EFFEXOR-XR) 24 hr capsule 150 mg  150 mg Oral Q breakfast Adonai Helzer B, MD   150 mg at 04/19/18 3818    Lab Results:  Results for orders placed or performed during the hospital encounter of 04/12/18 (from the past 48 hour(s))  Lithium level     Status: None   Collection Time: 04/19/18  6:41 AM  Result Value Ref Range   Lithium Lvl 1.10 0.60 - 1.20 mmol/L    Comment: Performed at [facility], [address] [place], [place] [address]  Valproic acid level     Status: Abnormal   Collection Time: 04/19/18  6:41 AM  Result Value Ref Range   Valproic Acid Lvl 196 (HH) 50.0 - 100.0 ug/mL    Comment: RESULT CONFIRMED BY MANUAL DILUTION /HKP CRITICAL RESULT CALLED TO, READ BACK BY AND VERIFIED WITH GWEN  [id number]_[id number]  ON 04/19/18 BY HKP Performed at [facility], [address] [place], [place] [address]   Comprehensive metabolic panel  Status: Abnormal   Collection Time: 04/19/18  6:41 AM  Result Value Ref Range   Sodium 136 135 - 145 mmol/L   Potassium 3.9 3.5 - 5.1 mmol/L   Chloride 102 101 - 111 mmol/L   CO2 25 22 - 32 mmol/L   Glucose, Bld 86 65 - 99 mg/dL   BUN 15 6 - 20 mg/dL   Creatinine, Ser 0.56 0.44 - 1.00 mg/dL   Calcium 9.2 8.9 - 10.3 mg/dL   Total Protein 6.9 6.5 - 8.1 g/dL   Albumin 4.4 3.5 - 5.0 g/dL   AST 10 (L) 15 - 41 U/L   ALT 7 (L) 14 - 54 U/L   Alkaline Phosphatase 48 38 - 126 U/L   Total Bilirubin 0.6 0.3 - 1.2 mg/dL   GFR calc non Af Amer >60 >60 mL/min   GFR calc Af Amer >60 >60 mL/min    Comment: (NOTE) The eGFR has been calculated using the CKD EPI equation. This calculation has not been validated in all clinical situations. eGFR's persistently <60 mL/min signify possible Chronic Kidney Disease.    Anion gap 9 5 - 15    Comment: Performed at [facility], [address] [place], [place] [address]    Blood Alcohol level:  Lab Results  Component Value Date   [facility] <10 04/11/2018   ETH <10 16/57/9038    Metabolic Disorder Labs: Lab Results  Component Value Date   HGBA1C 4.9 04/13/2018   MPG 93.93 04/13/2018   MPG 91.06 09/02/2017   No results found for: PROLACTIN Lab Results  Component Value Date   CHOL 185 04/13/2018   TRIG 81 04/13/2018   HDL 52 04/13/2018   CHOLHDL 3.6 04/13/2018   VLDL 16 04/13/2018   LDLCALC 117 (H) 04/13/2018   LDLCALC 74 09/02/2017    Physical Findings: AIMS:  , ,  ,  ,    CIWA:    COWS:     Musculoskeletal: Strength & Muscle Tone: within normal limits Gait & Station: normal Patient leans: N/A  Psychiatric Specialty Exam: Physical Exam  Nursing note and vitals reviewed. Psychiatric: Her speech is normal. Thought content normal. Her affect is blunt. She is slowed and withdrawn.  Cognition and memory are normal. She expresses impulsivity. She exhibits a depressed mood.    Review of Systems  Neurological: Negative.   Psychiatric/Behavioral: Positive for depression and substance abuse. The patient has insomnia.   All other systems reviewed and are negative.   Blood pressure 106/83, pulse 91, temperature 97.7 F (36.5 C), temperature source Oral, resp. rate 16, height _0  (1.575 m), weight 44.9 kg (99 lb), SpO2 100 %.Body mass index is 18.11 kg/m.  General Appearance: Casual  Eye Contact:  Good  Speech:  Clear and Coherent  Volume:  Normal  Mood:  Depressed  Affect:  Flat  Thought Process:  Goal Directed and Descriptions of Associations: Intact  Orientation:  Full (Time, Place, and Person)  Thought Content:  WDL  Suicidal Thoughts:  No  Homicidal Thoughts:  No  Memory:  Immediate;   Fair Recent;   Fair Remote;   Fair  Judgement:  Impaired  Insight:  Shallow  Psychomotor Activity:  Psychomotor Retardation  Concentration:  Concentration: Fair and Attention Span: Fair  Recall:  [employer] of Knowledge:  Fair  Language:  Fair  Akathisia:  No  Handed:  Right  AIMS (if indicated):     Assets:  Communication Skills Desire for Improvement Financial Resources/Insurance Housing  Intimacy Physical Health Resilience Social Support  ADL's:  Intact  Cognition:  WNL  Sleep:  Number of Hours: 6     Treatment Plan Summary: Daily contact with patient to assess and evaluate symptoms and progress in treatment and Medication management   Ms. Griffiths is a 42 year old female with a history of bipolar disorder admitted for suicidal ideationand worsening depression. She has not been improving.  #Suicidal ideation,resolved -patient able to contract for safety in the hospital  #Mood and psychosis, not improving -continue Effexor 150 mg daily -hold Depakote today, VPA level 196. It was 91 before. VPA and ammonia level in am  -hold Lithium today, Li level  1.1. It was 0.38 before. Li level in am -continue Abilify 10 mg daily   -Abilify maintena 400 mg every 28 days, next dose on 05/12/2018  #Weight loss -Ensure TID  #Abdominal pain with diarrhea and nausea -Protonix 40 mg daily -Loperamid 4 mg PRN -Compazine 10 mg PRN  #Substance abuse -positive for amphetamines and cannabis -minimizes problems and declines treatment -continue to encourage substance abuse counseling.  #Smoking cassation -nicotine patch is available  #Labs -lipid panel, TSH and A1C completed -EKG reviewed, QTc 409 -pregnancy testnegative  #Disposition -home with boyfriend -follow up with [facility]    Orson Slick, MD 04/19/2018, 9:15 AM

## 2018-04-19 NOTE — Tx Team (Signed)
Interdisciplinary Treatment and Diagnostic Plan Update  04/19/2018 Time of Session: [place] MRN: [id number]  Principal Diagnosis: Bipolar I disorder, most recent episode depressed, severe with psychotic features ([place])  Secondary Diagnoses: Principal Problem:   Bipolar I disorder, most recent episode depressed, severe with psychotic features ([place]) Active Problems:   Hx of glaucoma   Tobacco use disorder   Amphetamine use disorder, severe (HCC)   Cannabis use disorder, severe, dependence ([place])   Current Medications:  Current Facility-Administered Medications  Medication Dose Route Frequency Provider Last Rate Last Dose  . acetaminophen (TYLENOL) tablet 650 mg  650 mg Oral Q6H PRN [name], [name], MD      . alum & mag hydroxide-simeth (MAALOX/MYLANTA) 200-200-20 MG/5ML suspension 30 mL  30 mL Oral Q4H PRN [name], [name], MD      . ARIPiprazole (ABILIFY) tablet 10 mg  10 mg Oral Daily [name], [name], MD   10 mg at 04/19/18 0906  . ARIPiprazole ER (ABILIFY MAINTENA) injection 400 mg  400 mg Intramuscular Q28 days [name], [name], MD   400 mg at 04/14/18 1749  . feeding supplement (ENSURE ENLIVE) (ENSURE ENLIVE) liquid 237 mL  237 mL Oral TID BM [name], [name], MD   237 mL at 04/18/18 2107  . latanoprost (XALATAN) 0.005 % ophthalmic solution 1 drop  1 drop Both Eyes QHS [name], [name], MD   1 drop at 04/18/18 2105  . loperamide (IMODIUM) capsule 4 mg  4 mg Oral PRN [name], [name], MD      . magnesium hydroxide (MILK OF MAGNESIA) suspension 30 mL  30 mL Oral Daily PRN [name], [name], MD      . nicotine (NICODERM CQ - dosed in mg/24 hours) patch 21 mg  21 mg Transdermal Q0600 [name], [name], MD   21 mg at 04/19/18 0614  . pantoprazole (PROTONIX) EC tablet 40 mg  40 mg Oral Daily [name], [name], MD   40 mg at 04/19/18 0907  . prochlorperazine (COMPAZINE) tablet 10 mg  10 mg Oral Q6H PRN [name], [name], MD       . venlafaxine XR (EFFEXOR-XR) 24 hr capsule 150 mg  150 mg Oral Q breakfast [name], [name], MD   150 mg at 04/19/18 0906   PTA Medications: Medications Prior to Admission  Medication Sig Dispense Refill Last Dose  . ARIPiprazole (ABILIFY) 30 MG tablet Take 1 tablet (30 mg total) daily by mouth. 30 tablet 1   . divalproex (DEPAKOTE) 250 MG DR tablet Take 3 tablets (750 mg total) every 12 (twelve) hours by mouth. 180 tablet 1   . latanoprost (XALATAN) 0.005 % ophthalmic solution Place 1 drop at bedtime into both eyes. 2.5 mL 12   . lithium carbonate (LITHOBID) 300 MG CR tablet Take 2 tablets (600 mg total) every 12 (twelve) hours by mouth. 120 tablet 1   . OLANZapine (ZYPREXA) 15 MG tablet Take 2 tablets (30 mg total) at bedtime by mouth. 60 tablet 1   . QUEtiapine (SEROQUEL) 200 MG tablet Take 1 tablet (200 mg total) at bedtime as needed by mouth (sleep). 30 tablet 1   . venlafaxine XR (EFFEXOR-XR) 150 MG 24 hr capsule Take 1 capsule (150 mg total) daily with breakfast by mouth. 30 capsule 1     Patient Stressors: Financial difficulties Loss of Loss of child Medication change or noncompliance Substance abuse  Patient Strengths: Ability for insight Average or above average intelligence Capable of independent living  Communication skills Supportive family/friends  Treatment Modalities: Medication Management, Group therapy, Case management,  1 to 1 session with clinician, Psychoeducation, Recreational therapy.   Physician Treatment Plan for Primary Diagnosis: Bipolar I disorder, most recent episode depressed, severe with psychotic features ([place]) Long Term Goal(s): Improvement in symptoms so as ready for discharge Improvement in symptoms so as ready for discharge   Short Term Goals: Ability to identify changes in lifestyle to reduce recurrence of condition will improve Ability to verbalize feelings will improve Ability to disclose and discuss suicidal ideas Ability to  demonstrate self-control will improve Ability to identify and develop effective coping behaviors will improve Ability to maintain clinical measurements within normal limits will improve Compliance with prescribed medications will improve Ability to identify triggers associated with substance abuse/mental health issues will improve Ability to identify changes in lifestyle to reduce recurrence of condition will improve Ability to demonstrate self-control will improve Ability to identify triggers associated with substance abuse/mental health issues will improve  Medication Management: Evaluate patient's response, side effects, and tolerance of medication regimen.  Therapeutic Interventions: 1 to 1 sessions, Unit Group sessions and Medication administration.  Evaluation of Outcomes: Progressing  Physician Treatment Plan for Secondary Diagnosis: Principal Problem:   Bipolar I disorder, most recent episode depressed, severe with psychotic features ([place]) Active Problems:   Hx of glaucoma   Tobacco use disorder   Amphetamine use disorder, severe (HCC)   Cannabis use disorder, severe, dependence ([place])  Long Term Goal(s): Improvement in symptoms so as ready for discharge Improvement in symptoms so as ready for discharge   Short Term Goals: Ability to identify changes in lifestyle to reduce recurrence of condition will improve Ability to verbalize feelings will improve Ability to disclose and discuss suicidal ideas Ability to demonstrate self-control will improve Ability to identify and develop effective coping behaviors will improve Ability to maintain clinical measurements within normal limits will improve Compliance with prescribed medications will improve Ability to identify triggers associated with substance abuse/mental health issues will improve Ability to identify changes in lifestyle to reduce recurrence of condition will improve Ability to demonstrate self-control will  improve Ability to identify triggers associated with substance abuse/mental health issues will improve     Medication Management: Evaluate patient's response, side effects, and tolerance of medication regimen.  Therapeutic Interventions: 1 to 1 sessions, Unit Group sessions and Medication administration.  Evaluation of Outcomes: Progressing   RN Treatment Plan for Primary Diagnosis: Bipolar I disorder, most recent episode depressed, severe with psychotic features ([place]) Long Term Goal(s): Knowledge of disease and therapeutic regimen to maintain health will improve  Short Term Goals: Ability to verbalize feelings will improve, Ability to identify and develop effective coping behaviors will improve and Compliance with prescribed medications will improve  Medication Management: RN will administer medications as ordered by provider, will assess and evaluate patient's response and provide education to patient for prescribed medication. RN will report any adverse and/or side effects to prescribing provider.  Therapeutic Interventions: 1 on 1 counseling sessions, Psychoeducation, Medication administration, Evaluate responses to treatment, Monitor vital signs and CBGs as ordered, Perform/monitor CIWA, COWS, AIMS and Fall Risk screenings as ordered, Perform wound care treatments as ordered.  Evaluation of Outcomes: Progressing   LCSW Treatment Plan for Primary Diagnosis: Bipolar I disorder, most recent episode depressed, severe with psychotic features ([place]) Long Term Goal(s): Safe transition to appropriate next level of care at discharge, Engage patient in therapeutic group addressing interpersonal concerns.  Short Term Goals: Engage patient in aftercare  planning with referrals and resources, Facilitate acceptance of mental health diagnosis and concerns and Increase skills for wellness and recovery  Therapeutic Interventions: Assess for all discharge needs, 1 to 1 time with Social worker, Explore  available resources and support systems, Assess for adequacy in community support network, Educate family and significant other(s) on suicide prevention, Complete Psychosocial Assessment, Interpersonal group therapy.  Evaluation of Outcomes: Progressing   Progress in Treatment: Attending groups: Yes. Participating in groups: Yes. Taking medication as prescribed: Yes. Toleration medication: Yes. Family/Significant other contact made: Yes, individual(s) contacted:  pt's boyfriend Patient understands diagnosis: Yes. Discussing patient identified problems/goals with staff: Yes. Medical problems stabilized or resolved: Yes. Denies suicidal/homicidal ideation: Yes. Issues/concerns per patient self-inventory: No. Other: None at this time.   New problem(s) identified: No, Describe:  none at this time.  New Short Term/Long Term Goal(s): Pt will report decreased depression and increased motivation to complete ADLs by the time of discharge.   Patient Goals:  Pt reported her goal for treatment is, "to develop better coping skills and stay on my mediation."   Discharge Plan or Barriers: Pt will discharge home and will continue tx with [facility].   Reason for Continuation of Hospitalization: Depression Medication stabilization  Estimated Length of Stay: 3-5 days  Attendees: Patient: 04/19/2018 11:15 AM  Physician: Dr. Bary Leriche, MD 04/19/2018 11:15 AM  Nursing: Polly Cobia, RN 04/19/2018 11:15 AM  RN Care Manager: 04/19/2018 11:15 AM  Social Worker: Alden Hipp, LCSW 04/19/2018 11:15 AM  Recreational Therapist: Roanna Epley, LRT-CTRS 04/19/2018 11:15 AM  Other: Darin Engels, [place] 04/19/2018 11:15 AM  Other: Derrek Gu, LCSW 04/19/2018 11:15 AM  Other: Marney Doctor, [place]  04/19/2018 11:15 AM       Scribe for Treatment Team: Alden Hipp, LCSW 04/19/2018 11:27 AM

## 2018-04-19 NOTE — Plan of Care (Addendum)
Patient found in bed upon my arrival. Patient is neither visible nor social this evening. Remains isolative to bed throughout the evening. Denies all complaints including SI/HI/AVH. Affect is slightly brighter. Reports eating and voiding adequately. Drank Ensure. Compliant with HS eye drops. No other scheduled HS medications. Q 15 minute checks maintained. Will continue to monitor throughout the shift. @0030 , patient remains awake and complains of nausea. Given Compazine and Gatorade. Will monitor for efficacy. Patient slept 3.5 hours. Poor quality sleep reported. Will endorse care to oncoming shift.  Problem: Education: Goal: Utilization of techniques to improve thought processes will improve Outcome: Progressing Goal: Knowledge of the prescribed therapeutic regimen will improve Outcome: Progressing   Problem: Coping: Goal: Coping ability will improve Outcome: Progressing Goal: Will verbalize feelings Outcome: Progressing   Problem: Medication: Goal: Compliance with prescribed medication regimen will improve Outcome: Progressing

## 2018-04-19 NOTE — [facility] Group Notes (Signed)
[facility] Group Notes:  (Nursing/MHT/Case Management/Adjunct)  Date:  04/19/2018  Time:  9:34 PM  Type of Therapy:  Group Therapy  Participation Level:  Active  Participation Quality:  Appropriate  Affect:  Appropriate  Cognitive:  Appropriate  Insight:  Appropriate  Engagement in Group:  Engaged  Modes of Intervention:  Discussion  Summary of Progress/Problems:  Autumn [name] 04/19/2018, 9:34 PM

## 2018-04-19 NOTE — Plan of Care (Signed)
Thought process improving   working on coping skills . Able tor verbalize feeling   Remains to isolate from staff and peers . Compliant  with medication  given  . Verbalizing understanding of [place]   Denies suicidal ideations  Compliant with medication and able to verbalize  information about them.   Problem: Education: Goal: Utilization of techniques to improve thought processes will improve Outcome: Progressing Goal: Knowledge of the prescribed therapeutic regimen will improve Outcome: Progressing   Problem: Coping: Goal: Coping ability will improve Outcome: Progressing Goal: Will verbalize feelings Outcome: Progressing   Problem: Education: Goal: Knowledge of [facility] Education information/materials will improve Outcome: Progressing   Problem: Self-Concept: Goal: Ability to disclose and discuss suicidal ideas will improve Outcome: Progressing

## 2018-04-19 NOTE — Progress Notes (Signed)
D:  Affect pleasant on approach  Able to return conversation logical with content .  Patient remains to  Isolate herself from peers and staff  In room . Out for meals  Poor hygiene efforts Thought process improving   working on coping skills . Able tor verbalize feeling   Remains to isolate from staff and peers . Compliant  with medication  given  . Verbalizing understanding of [place]   Denies suicidal ideations.Compliant with medication and able to verbalize  information about them.  A: Encourage patient participation with unit programming . Instruction  Given on  Medication , verbalize understanding. R: Voice no other concerns. Staff continue to monitor

## 2018-04-19 NOTE — Progress Notes (Signed)
Recreation Therapy Notes  Date: 04/19/2018  Time: 9:30 am   Location: Craft Room   Behavioral response: N/A   Intervention Topic:  Communication  Discussion/Intervention: Patient did not attend group.   Clinical Observations/Feedback:  Patient did not attend group.   Oktober Glazer LRT/CTRS        Adaira Centola 04/19/2018 11:23 AM

## 2018-04-19 NOTE — [facility] Group Notes (Signed)
LCSW Group Therapy Note  04/19/2018 1:00 pm  Type of Therapy/Topic:  Group Therapy:  Emotion Regulation  Participation Level:  Active   Description of Group:    The purpose of this group is to assist patients in learning to regulate negative emotions and experience positive emotions. Patients will be guided to discuss ways in which they have been vulnerable to their negative emotions. These vulnerabilities will be juxtaposed with experiences of positive emotions or situations, and patients will be challenged to use positive emotions to combat negative ones. Special emphasis will be placed on coping with negative emotions in conflict situations, and patients will process healthy conflict resolution skills.  Therapeutic Goals: 1. Patient will identify two positive emotions or experiences to reflect on in order to balance out negative emotions 2. Patient will label two or more emotions that they find the most difficult to experience 3. Patient will demonstrate positive conflict resolution skills through discussion and/or role plays  Summary of Patient Progress:  Jeliyah actively engaged in today's group discussion on emotion regulation.  Takhia was able to identify two positive emotions that he has experienced to include feelings of excitement and love.  She has also experienced feelings of depression and loneliness.  Jazzmon shared that depression has also been the most difficult emotion to experience like many of the other group participants and that he tries to engage in positive things such as doing a craft to help resolution some of the negative emotions that she often experiences.     Therapeutic Modalities:   Cognitive Behavioral Therapy Feelings Identification Dialectical Behavioral Therapy

## 2018-04-20 LAB — VALPROIC ACID LEVEL: VALPROIC ACID LVL: 69 ug/mL (ref 50.0–100.0)

## 2018-04-20 LAB — AMMONIA: Ammonia: 36 umol/L — ABNORMAL HIGH (ref 9–35)

## 2018-04-20 LAB — LITHIUM LEVEL: LITHIUM LVL: 0.39 mmol/L — AB (ref 0.60–1.20)

## 2018-04-20 MED ORDER — DIVALPROEX SODIUM 500 MG PO DR TAB: 500.0000 mg | DELAYED_RELEASE_TABLET | Freq: Two times a day (BID) | ORAL | 1 refills | Status: DC

## 2018-04-20 MED ORDER — TRAZODONE HCL 100 MG PO TABS
100.0000 mg | ORAL_TABLET | Freq: Every day | ORAL | Status: DC
  Administered 2018-04-20: 100 mg via ORAL
  Filled 2018-04-20: qty 1

## 2018-04-20 MED ORDER — TRAZODONE HCL 100 MG PO TABS: 200.0000 mg | ORAL_TABLET | Freq: Every day | ORAL | 1 refills | Status: DC

## 2018-04-20 MED ORDER — DIVALPROEX SODIUM 500 MG PO DR TAB
500.0000 mg | DELAYED_RELEASE_TABLET | Freq: Two times a day (BID) | ORAL | Status: DC
  Administered 2018-04-20 – 2018-04-21 (×3): 500 mg via ORAL
  Filled 2018-04-20 (×2): qty 1

## 2018-04-20 MED ORDER — LITHIUM CARBONATE ER 450 MG PO TBCR: 450.0000 mg | EXTENDED_RELEASE_TABLET | Freq: Two times a day (BID) | ORAL | 1 refills | Status: DC

## 2018-04-20 MED ORDER — VENLAFAXINE HCL ER 150 MG PO CP24: 150.0000 mg | ORAL_CAPSULE | Freq: Every day | ORAL | 1 refills | Status: DC

## 2018-04-20 MED ORDER — ARIPIPRAZOLE 10 MG PO TABS: 10.0000 mg | ORAL_TABLET | Freq: Every day | ORAL | 0 refills | Status: DC

## 2018-04-20 MED ORDER — LITHIUM CARBONATE ER 450 MG PO TBCR
450.0000 mg | EXTENDED_RELEASE_TABLET | Freq: Two times a day (BID) | ORAL | Status: DC
  Administered 2018-04-20 – 2018-04-21 (×3): 450 mg via ORAL
  Filled 2018-04-20 (×3): qty 1

## 2018-04-20 MED ORDER — ARIPIPRAZOLE ER 400 MG IM SRER: 400.0000 mg | INTRAMUSCULAR | 1 refills | Status: DC

## 2018-04-20 MED ORDER — PANTOPRAZOLE SODIUM 40 MG PO TBEC: 40.0000 mg | DELAYED_RELEASE_TABLET | Freq: Every day | ORAL | 1 refills | Status: DC

## 2018-04-20 NOTE — Progress Notes (Signed)
Recreation Therapy Notes  Date: 04/20/2018  Time: 9:30 am  Location: Room 21  Behavioral response: Appropriate   Intervention Topic: Animal Assisted Therapy  Discussion/Intervention:  Patient participated in Animal Assisted Therapy during group today. Group facilitator defined Animal Assisted Therapy as the use of animals as a therapeutic tool to assist a person in restoring balance to their life.  The group facilitator also described the benefits of Animal Assisted Therapy as improving patients' mental, physical, social and emotional functioning with the aid of animals; depending on the needs of the patient. Individuals in the group were able to pet the dogs as well as ask questions.  Clinical Observations/Feedback:  Patient came to group and was focused on what peers and staff had to say about the topic at hand. Individual was social with peers and staff while stay on topic during group. Participant was engaged with the dogs during group. Patient left group early due to unknown reasons and never returned. Manaia Samad LRT/CTRS           Wrenley Sayed 04/20/2018 10:57 AM

## 2018-04-20 NOTE — Plan of Care (Signed)
Patient is alert and oriented X 4. Patient denies SI, HI and AVH. Patient states her sleep last night was very poor. Patient is pleasant and appropriate on the unit. Patient is attending group today and eating meals. Patient affect today is bright. Patient is capable of expressing thought and patient is knowledgeable about medications and is compliant with medications. Patient states her appetite is good and energy level rates as normal. Patient rates pain 0/10. Nurse will continue to monitor. Problem: Education: Goal: Utilization of techniques to improve thought processes will improve Outcome: Progressing Goal: Knowledge of the prescribed therapeutic regimen will improve Outcome: Progressing   Problem: Coping: Goal: Coping ability will improve Outcome: Progressing Goal: Will verbalize feelings Outcome: Progressing   Problem: Education: Goal: Knowledge of [facility] Education information/materials will improve Outcome: Progressing   Problem: Medication: Goal: Compliance with prescribed medication regimen will improve Outcome: Progressing

## 2018-04-20 NOTE — [facility] Group Notes (Signed)
  04/20/2018  Time: 1PM  Type of Therapy/Topic:  Group Therapy:  Balance in Life  Participation Level:  Minimal  Description of Group:   This group will address the concept of balance and how it feels and looks when one is unbalanced. Patients will be encouraged to process areas in their lives that are out of balance and identify reasons for remaining unbalanced. Facilitators will guide patients in utilizing problem-solving interventions to address and correct the stressor making their life unbalanced. Understanding and applying boundaries will be explored and addressed for obtaining and maintaining a balanced life. Patients will be encouraged to explore ways to assertively make their unbalanced needs known to significant others in their lives, using other group members and facilitator for support and feedback.  Therapeutic Goals: 1. Patient will identify two or more emotions or situations they have that consume much of in their lives. 2. Patient will identify signs/triggers that life has become out of balance:  3. Patient will identify two ways to set boundaries in order to achieve balance in their lives:  4. Patient will demonstrate ability to communicate their needs through discussion and/or role plays  Summary of Patient Progress: Pt continues to work towards their tx goals but has not yet reached them. Pt was able to appropriately participate in group discussion, and was able to offer support/validation to other group members. Pt reported one area of her life she would like to devote more attention to is, "spirituality." Pt reported one area of her life she'd like to devote less attention to is, "drugs."    Therapeutic Modalities:   Cognitive Behavioral Therapy Solution-Focused Therapy Assertiveness Training  Alden Hipp, MSW, LCSW Clinical Social Worker 04/20/2018 1:58 PM

## 2018-04-20 NOTE — Progress Notes (Signed)
[facility] MD Progress Note  04/20/2018 10:29 AM Autumn [name]  MRN:  [id number]  Subjective:   Autumn [name] is up and about this morning. He went to group and reports feeling better. No suicidal or homicidal ideation. We held Depakote and Lithium yesterday as the levels were too high. Today, VPA 69, Ammonia 36, Li 0.39. We will restart medications at a lower dose. Autumn [name], again, did not sleep last night.  Principal Problem: Bipolar I disorder, most recent episode depressed, severe with psychotic features ([place]) Diagnosis:   Patient Active Problem List   Diagnosis Date Noted  . Bipolar I disorder, most recent episode depressed, severe with psychotic features ([place]) [F31.5] 07/05/2017    Priority: High  . Tobacco use disorder [F17.200] 06/27/2017  . 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 Opioid use disorder, severe, dependence ([place]) [F11.20] 06/27/2017  . Cannabis use disorder, severe, dependence ([place]) [F12.20] 06/27/2017  . Hx of glaucoma [Z86.69] 10/02/2013   Total Time spent with patient: 20 minutes  Past Psychiatric History: bipolar  Past Medical History:  Past Medical History:  Diagnosis Date  . Allergy    Seasonal, Ultram (seizures)  . Anemia 2005  . Bipolar 1 disorder ([place])   . Glaucoma   . Seizures ([place]) 2008    Past Surgical History:  Procedure Laterality Date  . BREAST BIOPSY Right    x 2  . CESAREAN SECTION     x 2   Family History:  Family History  Problem Relation Age of Onset  . Hypertension Mother   . Hyperlipidemia Mother   . Parkinson's disease Father    Family Psychiatric  History: depression Social History:  Social History   Substance and Sexual Activity  Alcohol Use Yes  . Alcohol/week: 2.4 oz  . Types: 4 Cans of beer per week   Comment: Ocassional     Social History   Substance and Sexual Activity  Drug Use Yes  . Types: Marijuana   Comment: percocet -last use over 1 week ago    Social History   Socioeconomic History   . Marital status: Single    Spouse name: Not on file  . Number of children: Not on file  . Years of education: Not on file  . Highest education level: Not on file  Occupational History  . Not on file  Social Needs  . Financial resource strain: Not on file  . Food insecurity:    Worry: Not on file    Inability: Not on file  . Transportation needs:    Medical: Not on file    Non-medical: Not on file  Tobacco Use  . Smoking status: Current Every Day Smoker    Packs/day: 1.00    Years: 9.00    Pack years: 9.00  . Smokeless tobacco: Never Used  . Tobacco comment: Patient not interested  Substance and Sexual Activity  . Alcohol use: Yes    Alcohol/week: 2.4 oz    Types: 4 Cans of beer per week    Comment: Ocassional  . Drug use: Yes    Types: Marijuana    Comment: percocet -last use over 1 week ago  . Sexual activity: Never    Birth control/protection: None  Lifestyle  . Physical activity:    Days per week: Not on file    Minutes per session: Not on file  . Stress: Not on file  Relationships  . Social connections:    Talks on phone: Not on file  Gets together: Not on file    Attends religious service: Not on file    Active member of club or organization: Not on file    Attends meetings of clubs or organizations: Not on file    Relationship status: Not on file  Other Topics Concern  . Not on file  Social History Narrative  . Not on file   Additional Social History:                         Sleep: Poor  Appetite:  Fair  Current Medications: Current Facility-Administered Medications  Medication Dose Route Frequency Provider Last Rate Last Dose  . acetaminophen (TYLENOL) tablet 650 mg  650 mg Oral Q6H PRN Telesia Ates B, MD      . alum & mag hydroxide-simeth (MAALOX/MYLANTA) 200-200-20 MG/5ML suspension 30 mL  30 mL Oral Q4H PRN Jackob Crookston B, MD      . ARIPiprazole (ABILIFY) tablet 10 mg  10 mg Oral Daily Kevonta Phariss B, MD   10  mg at 04/20/18 0906  . ARIPiprazole ER (ABILIFY MAINTENA) injection 400 mg  400 mg Intramuscular Q28 days Myrtie Leuthold B, MD   400 mg at 04/14/18 1749  . divalproex (DEPAKOTE) DR tablet 500 mg  500 mg Oral Q12H Zamauri Nez B, MD      . feeding supplement (ENSURE ENLIVE) (ENSURE ENLIVE) liquid 237 mL  237 mL Oral TID BM India Jolin B, MD   237 mL at 04/19/18 1950  . latanoprost (XALATAN) 0.005 % ophthalmic solution 1 drop  1 drop Both Eyes QHS Tyvion Edmondson B, MD   1 drop at 04/19/18 2100  . lithium carbonate (ESKALITH) CR tablet 450 mg  450 mg Oral Q12H Marshia Tropea B, MD      . loperamide (IMODIUM) capsule 4 mg  4 mg Oral PRN Deniro Laymon B, MD      . magnesium hydroxide (MILK OF MAGNESIA) suspension 30 mL  30 mL Oral Daily PRN Deral Schellenberg B, MD      . nicotine (NICODERM CQ - dosed in mg/24 hours) patch 21 mg  21 mg Transdermal Q0600 Aarti Mankowski B, MD   21 mg at 04/20/18 0602  . pantoprazole (PROTONIX) EC tablet 40 mg  40 mg Oral Daily Nicholos Aloisi B, MD   40 mg at 04/20/18 0906  . prochlorperazine (COMPAZINE) tablet 10 mg  10 mg Oral Q6H PRN Lallie Strahm B, MD   10 mg at 04/20/18 0040  . venlafaxine XR (EFFEXOR-XR) 24 hr capsule 150 mg  150 mg Oral Q breakfast Raye Slyter B, MD   150 mg at 04/20/18 1308    Lab Results:  Results for orders placed or performed during the hospital encounter of 04/12/18 (from the past 48 hour(s))  Lithium level     Status: None   Collection Time: 04/19/18  6:41 AM  Result Value Ref Range   Lithium Lvl 1.10 0.60 - 1.20 mmol/L    Comment: Performed at [facility], [address] [place],  [address]  Valproic acid level     Status: Abnormal   Collection Time: 04/19/18  6:41 AM  Result Value Ref Range   Valproic Acid Lvl 196 (HH) 50.0 - 100.0 ug/mL    Comment: RESULT CONFIRMED BY MANUAL DILUTION /HKP CRITICAL RESULT CALLED TO, READ BACK BY AND VERIFIED WITH GWEN  FARRISH'@[id number]'  ON 04/19/18 BY HKP Performed at [facility], [address], [place], [place]  [address]   Comprehensive metabolic panel     Status: Abnormal   Collection Time: 04/19/18  6:41 AM  Result Value Ref Range   Sodium 136 135 - 145 mmol/L   Potassium 3.9 3.5 - 5.1 mmol/L   Chloride 102 101 - 111 mmol/L   CO2 25 22 - 32 mmol/L   Glucose, Bld 86 65 - 99 mg/dL   BUN 15 6 - 20 mg/dL   Creatinine, Ser 0.56 0.44 - 1.00 mg/dL   Calcium 9.2 8.9 - 10.3 mg/dL   Total Protein 6.9 6.5 - 8.1 g/dL   Albumin 4.4 3.5 - 5.0 g/dL   AST 10 (L) 15 - 41 U/L   ALT 7 (L) 14 - 54 U/L   Alkaline Phosphatase 48 38 - 126 U/L   Total Bilirubin 0.6 0.3 - 1.2 mg/dL   GFR calc non Af Amer >60 >60 mL/min   GFR calc Af Amer >60 >60 mL/min    Comment: (NOTE) The eGFR has been calculated using the CKD EPI equation. This calculation has not been validated in all clinical situations. eGFR's persistently <60 mL/min signify possible Chronic Kidney Disease.    Anion gap 9 5 - 15    Comment: Performed at [facility], [address] [place], [place] [address]  CBC     Status: None   Collection Time: 04/19/18  6:41 AM  Result Value Ref Range   WBC 10.7 3.6 - 11.0 K/uL   RBC 4.80 3.80 - 5.20 MIL/uL   Hemoglobin 15.1 12.0 - 16.0 g/dL   HCT 43.8 35.0 - 47.0 %   MCV 91.3 80.0 - 100.0 fL   MCH 31.4 26.0 - 34.0 pg   MCHC 34.4 32.0 - 36.0 g/dL   RDW 12.9 11.5 - 14.5 %   Platelets 164 150 - 440 K/uL    Comment: Performed at [facility], [address] [place], [place] [address]  Lithium level     Status: Abnormal   Collection Time: 04/20/18  7:15 AM  Result Value Ref Range   Lithium Lvl 0.39 (L) 0.60 - 1.20 mmol/L    Comment: Performed at [facility], [address] [place], [place] [address]  Valproic acid level     Status: None   Collection Time: 04/20/18  7:15 AM  Result Value Ref Range   Valproic Acid Lvl 69 50.0 - 100.0 ug/mL    Comment: Performed at  [facility], [address] [place], [place] [address]  Ammonia     Status: Abnormal   Collection Time: 04/20/18  7:15 AM  Result Value Ref Range   Ammonia 36 (H) 9 - 35 umol/L    Comment: Performed at [facility], [address] [place], [place] [address]    Blood Alcohol level:  Lab Results  Component Value Date   [facility] <10 04/11/2018   ETH <10 41/93/7902    Metabolic Disorder Labs: Lab Results  Component Value Date   HGBA1C 4.9 04/13/2018   MPG 93.93 04/13/2018   MPG 91.06 09/02/2017   No results found for: PROLACTIN Lab Results  Component Value Date   CHOL 185 04/13/2018   TRIG 81 04/13/2018   HDL 52 04/13/2018   CHOLHDL 3.6 04/13/2018   VLDL 16 04/13/2018   LDLCALC 117 (H) 04/13/2018   LDLCALC 74 09/02/2017    Physical Findings: AIMS:  , ,  ,  ,    CIWA:    COWS:     Musculoskeletal:  Strength & Muscle Tone: within normal limits Gait & Station: normal Patient leans: N/A  Psychiatric Specialty Exam: Physical Exam  Nursing note and vitals reviewed. Psychiatric: Her speech is normal and behavior is normal. Thought content normal. Cognition and memory are normal. Autumn [name] expresses impulsivity. Autumn [name] exhibits a depressed mood.    Review of Systems  Neurological: Negative.   Psychiatric/Behavioral: The patient has insomnia.   All other systems reviewed and are negative.   Blood pressure 107/75, pulse 89, temperature 98.1 F (36.7 C), temperature source Oral, resp. rate 18, height '5\' 2"'  (1.575 m), weight 44.9 kg (99 lb), SpO2 99 %.Body mass index is 18.11 kg/m.  General Appearance: Casual  Eye Contact:  Good  Speech:  Clear and Coherent  Volume:  Normal  Mood:  Depressed  Affect:  Appropriate  Thought Process:  Goal Directed and Descriptions of Associations: Intact  Orientation:  Full (Time, Place, and Person)  Thought Content:  WDL  Suicidal Thoughts:  No  Homicidal Thoughts:  No  Memory:  Immediate;   Fair Recent;   Fair Remote;    Fair  Judgement:  Impaired  Insight:  Shallow  Psychomotor Activity:  Normal  Concentration:  Concentration: Fair and Attention Span: Fair  Recall:  [employer] of Knowledge:  Fair  Language:  Fair  Akathisia:  No  Handed:  Right  AIMS (if indicated):     Assets:  Communication Skills Desire for Improvement Financial Resources/Insurance Housing Physical Health Resilience Social Support Vocational/Educational  ADL's:  Intact  Cognition:  WNL  Sleep:  Number of Hours: 3.5     Treatment Plan Summary: Daily contact with patient to assess and evaluate symptoms and progress in treatment and Medication management   Autumn [name] is a 42 year old female with a history of bipolar disorder admitted for suicidal ideationand worsening depression.Autumn [name] feels better today and is no longer suicidal.   #Suicidal ideation,resolved -patient able to contract for safety in the hospital  #Mood and psychosis,improved -continue Effexor 150 mg daily -restart Depakote at a lower dose 500 mg BID, VPA 69 and Ammonia 36 -restart Lithium at a lower dose 450 mg BID, Li 0.39  -continue Abilify 10 mg daily -Abilify maintena 400 mg every 28 days,next dose on 05/12/2018  #Insomnia -start Trazodone 100 mg nightly  #Weight loss -Ensure TID  #Abdominal pain with diarrhea and nausea, resolved -Protonix 40 mg daily  #Substance abuse -positive for amphetamines and cannabis -minimizes problems and declines treatment -continue to encourage substance abuse counseling.  #Smoking cassation -nicotine patch is available  #Labs -lipid panel, TSH and A1C are unremarkable -EKG reviewed, QTc 409 -pregnancy testnegative  #Disposition -home with boyfriend -follow up with [facility]     Orson Slick, MD 04/20/2018, 10:29 AM

## 2018-04-20 NOTE — [facility] Group Notes (Signed)
LCSW Group Therapy Note 04/20/2018 9:00AM  Type of Therapy and Topic:  Group Therapy:  Setting Goals  Participation Level:  Active  Description of Group: In this process group, patients discussed using strengths to work toward goals and address challenges.  Patients identified two positive things about themselves and one goal they were working on.  Patients were given the opportunity to share openly and support each other's plan for self-empowerment.  The group discussed the value of gratitude and were encouraged to have a daily reflection of positive characteristics or circumstances.  Patients were encouraged to identify a plan to utilize their strengths to work on current challenges and goals.  Therapeutic Goals 1. Patient will verbalize personal strengths/positive qualities and relate how these can assist with achieving desired personal goals 2. Patients will verbalize affirmation of peers plans for personal change and goal setting 3. Patients will explore the value of gratitude and positive focus as related to successful achievement of goals 4. Patients will verbalize a plan for regular reinforcement of personal positive qualities and circumstances.  Summary of Patient Progress:  Autumn [name] actively participated in today's group discussion on setting goals using the SMART Model.  Autumn [name] appears to have a good understanding of how she can establish her own life goals using the SMART Model.  Autumn [name] shared that she has a short-term goal that includes attending all groups today and take in the information given to her so she can learn better coping strategies.  Autumn [name] shared that it is important to her that she is able to use better coping strategies in her life when she is discharged in order for things to not get so back with her life that she has to come back to the hospital.     Therapeutic Modalities Rennert,  LCSW 04/20/2018 11:26 AM

## 2018-04-21 MED ORDER — TRAZODONE HCL 100 MG PO TABS: 200.0000 mg | ORAL_TABLET | Freq: Every day | ORAL | 1 refills | Status: DC

## 2018-04-21 MED ORDER — TRAZODONE HCL 100 MG PO TABS: 200.0000 mg | ORAL_TABLET | Freq: Every day | ORAL | Status: DC

## 2018-04-21 NOTE — Discharge Summary (Signed)
Physician Discharge Summary Note  Patient:  Autumn [name] is an 42 y.o., female MRN:  [id number] DOB:  [birth year] Patient phone:  [phone] (home)  Patient address:   [place] [address],  Total Time spent with patient: 20 minutes plus 15 min on care coordination and documantation  Date of Admission:  04/12/2018 Date of Discharge: 04/21/2018  Reason for Admission:  Suicidal ideation.  History of Present Illness:   Identifying data. Autumn [name] is a 42 year old female with a history of bipolar disorder.  Chief complaint. "There was a mess with my medications."  HIstory of present illness. Information was obtained from the patient and the chart. The patient came to the ER complaining of severe depression of several weeks duration and suicidal ideation with a plan to overdose. She lost weight, has not been able to sleep, became fearful, paranoid and hallucinating. She has not gone to work since Monday. She has been under considerable stress trying to regain the custody of her 19 year old daughter who lives with paternal grandmother. This has been going on for years and is not a new stressor. There were some changes in her medication regimen. At times, Autumn [name] is unable to afford all necessary medications. As frequently, she is positve for amphetamines and cannabis.   Past psychiatric history. 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 Allergic to Haldol and refusing injectable.  Family psychiatric history. Mother with depression.  Social history. Lives with her boyfriend at a boarding house. Refuses to apply for diasbility and continues to work. She is a good employee when well. Has private insurance.    Principal Problem: Bipolar I disorder, most recent episode depressed, severe with psychotic features [facility]) Discharge Diagnoses: Patient Active Problem List   Diagnosis  Date Noted  . Bipolar I disorder, most recent episode depressed, severe with psychotic features ([place]) [F31.5] 07/05/2017    Priority: High  . Tobacco use disorder [F17.200] 06/27/2017  . Amphetamine use disorder, severe ([place]) [F15.20] 06/27/2017  . Opioid use disorder, severe, dependence ([place]) [F11.20] 06/27/2017  . Cannabis use disorder, severe, dependence ([place]) [F12.20] 06/27/2017  . Hx of glaucoma [Z86.69] 10/02/2013    Past Medical History:  Past Medical History:  Diagnosis Date  . Allergy    Seasonal, Ultram (seizures)  . Anemia 2005  . Bipolar 1 disorder ([place])   . Glaucoma   . Seizures ([place]) 2008    Past Surgical History:  Procedure Laterality Date  . BREAST BIOPSY Right    x 2  . CESAREAN SECTION     x 2   Family History:  Family History  Problem Relation Age of Onset  . Hypertension Mother   . Hyperlipidemia Mother   . Parkinson's disease Father    Social History:  Social History   Substance and Sexual Activity  Alcohol Use Yes  . Alcohol/week: 2.4 oz  . Types: 4 Cans of beer per week   Comment: Ocassional     Social History   Substance and Sexual Activity  Drug Use Yes  . Types: Marijuana   Comment: percocet -last use over 1 week ago    Social History   Socioeconomic History  . Marital status: Single    Spouse name: Not on file  . Number of children: Not on file  . Years of education: Not on file  . Highest education level: Not on file  Occupational History  . Not on file  Social Needs  .  Financial resource strain: Not on file  . Food insecurity:    Worry: Not on file    Inability: Not on file  . Transportation needs:    Medical: Not on file    Non-medical: Not on file  Tobacco Use  . Smoking status: Current Every Day Smoker    Packs/day: 1.00    Years: 9.00    Pack years: 9.00  . Smokeless tobacco: Never Used  . Tobacco comment: Patient not interested  Substance and Sexual Activity  . Alcohol use: Yes    Alcohol/week: 2.4 oz     Types: 4 Cans of beer per week    Comment: Ocassional  . Drug use: Yes    Types: Marijuana    Comment: percocet -last use over 1 week ago  . Sexual activity: Never    Birth control/protection: None  Lifestyle  . Physical activity:    Days per week: Not on file    Minutes per session: Not on file  . Stress: Not on file  Relationships  . Social connections:    Talks on phone: Not on file    Gets together: Not on file    Attends religious service: Not on file    Active member of club or organization: Not on file    Attends meetings of clubs or organizations: Not on file    Relationship status: Not on file  Other Topics Concern  . Not on file  Social History Narrative  . Not on file    Hospital Course:   Autumn [name] is a 42 year old female with a history of bipolar disorder admitted for suicidal ideationand worsening depression in the context of medication noncompliance. She was restarted on her medications and tolerated them well. At the time of discharge, she is no longer suicidal or homicidal. She is able to contract for safety. She is forward thinking and optimistic about the future.   #Mood and psychosis,improved -continue Effexor 150 mg daily -continue Depakote 500 mg BID, VPA 69 (VPA level was too high on 750 mg BID) -continue Lithium at a lower dose 450 mg BID, Li 0.39  -continueAbilify 10 mg daily for 7 more days then stop -continue Abilify maintena 400 mg every 28 days,next dose on 05/12/2018  #Insomnia -continue Trazodone 200 mg nightly  #GERD -Protonix 40 mg daily  #Substance abuse -positive for amphetamines and cannabis -minimizes problems and declines treatment -continue to encourage substance abuse counseling.  #Smoking cassation -nicotine patch was available  #Labs -lipid panel, TSH and A1C are unremarkable -EKG reviewed, QTc 409 -pregnancy testnegative  #Disposition -home with boyfriend -follow up with [facility]    Physical  Findings: AIMS:  , ,  ,  ,    CIWA:    COWS:     Musculoskeletal: Strength & Muscle Tone: within normal limits Gait & Station: normal Patient leans: N/A  Psychiatric Specialty Exam: Physical Exam  Nursing note and vitals reviewed. Psychiatric: She has a normal mood and affect. Her speech is normal and behavior is normal. Thought content normal. Cognition and memory are normal. She expresses impulsivity.    Review of Systems  Neurological: Negative.   Psychiatric/Behavioral: Positive for substance abuse.  All other systems reviewed and are negative.   Blood pressure 105/77, pulse 96, temperature 97.9 F (36.6 C), temperature source Oral, resp. rate 16, height 5\' 2"  (1.575 m), weight 44.9 kg (99 lb), SpO2 99 %.Body mass index is 18.11 kg/m.  General Appearance: Casual  Eye Contact:  Good  Speech:  Clear and Coherent  Volume:  Normal  Mood:  Euthymic  Affect:  Appropriate  Thought Process:  Goal Directed and Descriptions of Associations: Intact  Orientation:  Full (Time, Place, and Person)  Thought Content:  WDL  Suicidal Thoughts:  No  Homicidal Thoughts:  No  Memory:  Immediate;   Fair Recent;   Fair Remote;   Fair  Judgement:  Impaired  Insight:  Shallow  Psychomotor Activity:  Normal  Concentration:  Concentration: Fair and Attention Span: Fair  Recall:  [employer] of Knowledge:  Fair  Language:  Fair  Akathisia:  No  Handed:  Right  AIMS (if indicated):     Assets:  Communication Skills Desire for Improvement Financial Resources/Insurance Housing Physical Health Resilience Social Support  ADL's:  Intact  Cognition:  WNL  Sleep:  Number of Hours: 5.15     Have you used any form of tobacco in the last 30 days? (Cigarettes, Smokeless Tobacco, Cigars, and/or Pipes): Yes  Has this patient used any form of tobacco in the last 30 days? (Cigarettes, Smokeless Tobacco, Cigars, and/or Pipes) Yes, Yes, A prescription for an FDA-approved tobacco cessation medication  was offered at discharge and the patient refused  Blood Alcohol level:  Lab Results  Component Value Date   ETH <10 04/11/2018   ETH <10 02/58/5277    Metabolic Disorder Labs:  Lab Results  Component Value Date   HGBA1C 4.9 04/13/2018   MPG 93.93 04/13/2018   MPG 91.06 09/02/2017   No results found for: PROLACTIN Lab Results  Component Value Date   CHOL 185 04/13/2018   TRIG 81 04/13/2018   HDL 52 04/13/2018   CHOLHDL 3.6 04/13/2018   VLDL 16 04/13/2018   LDLCALC 117 (H) 04/13/2018   [place] 74 09/02/2017    See Psychiatric Specialty Exam and Suicide Risk Assessment completed by Attending Physician prior to discharge.  Discharge destination:  Home  Is patient on multiple antipsychotic therapies at discharge:  No   Has Patient had three or more failed trials of antipsychotic monotherapy by history:  No  Recommended Plan for Multiple Antipsychotic Therapies: NA  Discharge Instructions    Diet - low sodium heart healthy   Complete by:  As directed    Increase activity slowly   Complete by:  As directed      Allergies as of 04/21/2018      Reactions   Haldol [haloperidol Lactate]    Patient states that she gets stiff muscles when taking Haldol   Ultram [tramadol] Other (See Comments)   Seizures      Medication List    STOP taking these medications   OLANZapine 15 MG tablet Commonly known as:  ZYPREXA   QUEtiapine 200 MG tablet Commonly known as:  SEROQUEL     TAKE these medications     Indication  ARIPiprazole 10 MG tablet Commonly known as:  ABILIFY Take 1 tablet (10 mg total) by mouth daily. What changed:    medication strength  how much to take  Indication:  MIXED BIPOLAR AFFECTIVE DISORDER   ARIPiprazole ER 400 MG Srer injection Commonly known as:  ABILIFY MAINTENA Inject 2 mLs (400 mg total) into the muscle every 28 (twenty-eight) days. Start taking on:  05/12/2018 What changed:  You were already taking a medication with the same name, and  this prescription was added. Make sure you understand how and when to take each.  Indication:  MIXED BIPOLAR AFFECTIVE DISORDER   divalproex 500 MG DR  tablet Commonly known as:  DEPAKOTE Take 1 tablet (500 mg total) by mouth every 12 (twelve) hours. What changed:    medication strength  how much to take  Indication:  Multiple Seizure Types   latanoprost 0.005 % ophthalmic solution Commonly known as:  XALATAN Place 1 drop at bedtime into both eyes.  Indication:  Wide-Angle Glaucoma   lithium carbonate 450 MG CR tablet Commonly known as:  ESKALITH Take 1 tablet (450 mg total) by mouth every 12 (twelve) hours. What changed:    medication strength  how much to take  Indication:  Manic-Depression   pantoprazole 40 MG tablet Commonly known as:  PROTONIX Take 1 tablet (40 mg total) by mouth daily.  Indication:  Gastroesophageal Reflux Disease   traZODone 100 MG tablet Commonly known as:  DESYREL Take 2 tablets (200 mg total) by mouth at bedtime.  Indication:  Trouble Sleeping   venlafaxine XR 150 MG 24 hr capsule Commonly known as:  EFFEXOR-XR Take 1 capsule (150 mg total) by mouth daily with breakfast.  Indication:  Major Depressive Disorder      Follow-up Information    [place] on 04/24/2018.   Why:  Please go to your hospital follow up appointment with [facility] on Monday, 04/24/18 at 2:30PM. Thank you.  Contact information: [place] [address] [phone]           Follow-up recommendations:  Activity:  as tolerated Diet:  low sodium heart healthy Other:  keep follow up appointments  Comments:     Signed: Orson Slick, MD 04/21/2018, 9:32 AM

## 2018-04-21 NOTE — Plan of Care (Signed)
Pt calm and cooperative this evening. Pt denies SI/HI. Pt compliant with medications. Pt given trazodone for sleep but wanted more in the middle of the night. Pt was able to go back to sleep. Pt is receptive to treatment and safety maintained on unit. Will continue to monitor. Problem: Education: Goal: Utilization of techniques to improve thought processes will improve Outcome: Progressing   Problem: Coping: Goal: Coping ability will improve Outcome: Progressing Goal: Will verbalize feelings Outcome: Progressing   Problem: Education: Goal: Knowledge of  [place] [facility] Education information/materials will improve Outcome: Progressing   Problem: Self-Concept: Goal: Ability to disclose and discuss suicidal ideas will improve Outcome: Progressing   Problem: Elimination: Goal: Will not experience complications related to bowel motility Outcome: Progressing

## 2018-04-21 NOTE — Progress Notes (Signed)
  [facility] Adult Case Management Discharge Plan :  Will you be returning to the same living situation after discharge:  Yes,  returning home. At discharge, do you have transportation home?: Yes,  pt's boyfriend. Do you have the ability to pay for your medications: Yes,  [facility]  Release of information consent forms completed and in the chart;  [name]  Patient to Follow up at: Follow-up Information    [facility] on 04/24/2018.   Why:  Please go to your hospital follow up appointment with [facility] on Monday, 04/24/18 at 2:30PM. Thank you.  Contact information: [place] [address] [phone]           Next level of care provider has access to [place] and Suicide Prevention discussed: Yes,  with pt and her boyfriend.  Have you used any form of tobacco in the last 30 days? (Cigarettes, Smokeless Tobacco, Cigars, and/or Pipes): Yes  Has patient been referred to the Quitline?: Patient refused referral  Patient has been referred for addiction treatment: N/A  Alden Hipp, LCSW 04/21/2018, 8:59 AM

## 2018-04-21 NOTE — [facility] Suicide Risk Assessment (Signed)
[facility] Discharge Suicide Risk Assessment   Principal Problem: Bipolar I disorder, most recent episode depressed, severe with psychotic features [facility]) Discharge Diagnoses:  Patient Active Problem List   Diagnosis Date Noted  . Bipolar I disorder, most recent episode depressed, severe with psychotic features ([place]) [F31.5] 07/05/2017    Priority: High  . Tobacco use disorder [F17.200] 06/27/2017  . Amphetamine use disorder, severe ([place]) [F15.20] 06/27/2017  . Opioid use disorder, severe, dependence ([place]) [F11.20] 06/27/2017  . Cannabis use disorder, severe, dependence ([place]) [F12.20] 06/27/2017  . Hx of glaucoma [Z86.69] 10/02/2013    Total Time spent with patient: 20 minutes  Musculoskeletal: Strength & Muscle Tone: within normal limits Gait & Station: normal Patient leans: N/A  Psychiatric Specialty Exam: Review of Systems  Neurological: Negative.   Psychiatric/Behavioral: Positive for substance abuse.  All other systems reviewed and are negative.   Blood pressure 105/77, pulse 96, temperature 97.9 F (36.6 C), temperature source Oral, resp. rate 16, height 5\' 2"  (1.575 m), weight 44.9 kg (99 lb), SpO2 99 %.Body mass index is 18.11 kg/m.  General Appearance: Casual  Eye Contact::  Good  Speech:  Clear and Coherent409  Volume:  Normal  Mood:  Euthymic  Affect:  Appropriate  Thought Process:  Goal Directed and Descriptions of Associations: Intact  Orientation:  Full (Time, Place, and Person)  Thought Content:  WDL  Suicidal Thoughts:  No  Homicidal Thoughts:  No  Memory:  Immediate;   Fair Recent;   Fair Remote;   Fair  Judgement:  Impaired  Insight:  Present  Psychomotor Activity:  Normal  Concentration:  Fair  Recall:  [employer] of [place]  Language: Fair  Akathisia:  No  Handed:  Right  AIMS (if indicated):     Assets:  Communication Skills Desire for Improvement Financial Resources/Insurance Housing Intimacy Physical Health Resilience Social  Support Vocational/Educational  Sleep:  Number of Hours: 5.15  Cognition: WNL  ADL's:  Intact   Mental Status Per Nursing Assessment::   On Admission:  NA  Demographic Factors:  Caucasian and Low socioeconomic status  Loss Factors: NA  Historical Factors: Prior suicide attempts, Family history of mental illness or substance abuse and Impulsivity  Risk Reduction Factors:   Sense of responsibility to family, Employed, Living with another person, especially a relative, Positive social support and Positive therapeutic relationship  Continued Clinical Symptoms:  Bipolar Disorder:   Depressive phase Depression:   Comorbid alcohol abuse/dependence Impulsivity Alcohol/Substance Abuse/Dependencies Previous Psychiatric Diagnoses and Treatments  Cognitive Features That Contribute To Risk:  None    Suicide Risk:  Minimal: No identifiable suicidal ideation.  Patients presenting with no risk factors but with morbid ruminations; may be classified as minimal risk based on the severity of the depressive symptoms  Follow-up Information    [place] on 04/24/2018.   Why:  Please go to your hospital follow up appointment with [facility] on Monday, 04/24/18 at 2:30PM. Thank you.  Contact information: [place] [address] [phone]           Plan Of Care/Follow-up recommendations:  Activity:  as tolerated Diet:  low sodium heart healthy Other:  keep follow up appointments  Orson Slick, MD 04/21/2018, 9:29 AM

## 2018-11-21 ENCOUNTER — Other Ambulatory Visit: Payer: Self-pay

## 2018-11-21 ENCOUNTER — Emergency Department
Admission: EM | Admit: 2018-11-21 | Discharge: 2018-11-21 | Disposition: A | Discharge status: Other Acute Inpatient Hospital | Payer: Self-pay | Attending: Internal Medicine | Admitting: Internal Medicine

## 2018-11-21 ENCOUNTER — Inpatient Hospital Stay
Admission: AD | Admit: 2018-11-21 | Discharge: 2018-12-01 | DRG: 885 | Disposition: A | Discharge status: Home / Self Care | Payer: No Typology Code available for payment source | Source: Intra-hospital | Attending: Psychiatry | Admitting: Psychiatry

## 2018-11-21 ENCOUNTER — Encounter: Payer: Self-pay | Admitting: Emergency Medicine

## 2018-11-21 DIAGNOSIS — Z91410 Personal history of adult physical and sexual abuse: Secondary | ICD-10-CM

## 2018-11-21 DIAGNOSIS — Z5181 Encounter for therapeutic drug level monitoring: Secondary | ICD-10-CM

## 2018-11-21 DIAGNOSIS — F1120 Opioid dependence, uncomplicated: Secondary | ICD-10-CM | POA: Diagnosis present

## 2018-11-21 DIAGNOSIS — Z9114 Patient's other noncompliance with medication regimen: Secondary | ICD-10-CM | POA: Diagnosis not present

## 2018-11-21 DIAGNOSIS — R45851 Suicidal ideations: Secondary | ICD-10-CM | POA: Diagnosis present

## 2018-11-21 DIAGNOSIS — Z79899 Other long term (current) drug therapy: Secondary | ICD-10-CM

## 2018-11-21 DIAGNOSIS — Z888 Allergy status to other drugs, medicaments and biological substances status: Secondary | ICD-10-CM | POA: Diagnosis not present

## 2018-11-21 DIAGNOSIS — Z560 Unemployment, unspecified: Secondary | ICD-10-CM

## 2018-11-21 DIAGNOSIS — F314 Bipolar disorder, current episode depressed, severe, without psychotic features: Secondary | ICD-10-CM | POA: Diagnosis present

## 2018-11-21 DIAGNOSIS — F1910 Other psychoactive substance abuse, uncomplicated: Secondary | ICD-10-CM

## 2018-11-21 DIAGNOSIS — F17210 Nicotine dependence, cigarettes, uncomplicated: Secondary | ICD-10-CM | POA: Diagnosis present

## 2018-11-21 DIAGNOSIS — F1110 Opioid abuse, uncomplicated: Secondary | ICD-10-CM

## 2018-11-21 DIAGNOSIS — F319 Bipolar disorder, unspecified: Secondary | ICD-10-CM | POA: Insufficient documentation

## 2018-11-21 DIAGNOSIS — F17200 Nicotine dependence, unspecified, uncomplicated: Secondary | ICD-10-CM | POA: Diagnosis present

## 2018-11-21 DIAGNOSIS — F1220 Cannabis dependence, uncomplicated: Secondary | ICD-10-CM | POA: Diagnosis present

## 2018-11-21 LAB — LIPID PANEL
Cholesterol: 196 mg/dL (ref 0–200)
HDL: 53 mg/dL (ref >40)
LDL Cholesterol: 124 mg/dL — ABNORMAL HIGH (ref 0–99)
Total CHOL/HDL Ratio: 3.7 RATIO
Triglycerides: 97 mg/dL (ref <150)
VLDL: 19 mg/dL (ref 0–40)

## 2018-11-21 LAB — URINE DRUG SCREEN, QUALITATIVE (ARMC ONLY)
AMPHETAMINES, UR SCREEN: NOT DETECTED
Barbiturates, Ur Screen: NOT DETECTED
Benzodiazepine, Ur Scrn: NOT DETECTED
Cannabinoid 50 Ng, Ur ~~LOC~~: NOT DETECTED
Cocaine Metabolite,Ur ~~LOC~~: NOT DETECTED
MDMA (ECSTASY) UR SCREEN: NOT DETECTED
METHADONE SCREEN, URINE: NOT DETECTED
Opiate, Ur Screen: POSITIVE — AB
Phencyclidine (PCP) Ur S: NOT DETECTED
Tricyclic, Ur Screen: NOT DETECTED

## 2018-11-21 LAB — CBC
HCT: 41.9 % (ref 36.0–46.0)
Hemoglobin: 13.5 g/dL (ref 12.0–15.0)
MCH: 30.2 pg (ref 26.0–34.0)
MCHC: 32.2 g/dL (ref 30.0–36.0)
MCV: 93.7 fL (ref 80.0–100.0)
PLATELETS: 278 10*3/uL (ref 150–400)
RBC: 4.47 MIL/uL (ref 3.87–5.11)
RDW: 11.9 % (ref 11.5–15.5)
WBC: 8.6 10*3/uL (ref 4.0–10.5)
nRBC: 0 % (ref 0.0–0.2)

## 2018-11-21 LAB — HEMOGLOBIN A1C
Hgb A1c MFr Bld: 5 % (ref 4.8–5.6)
Mean Plasma Glucose: 96.8 mg/dL

## 2018-11-21 LAB — COMPREHENSIVE METABOLIC PANEL
ALBUMIN: 4.1 g/dL (ref 3.5–5.0)
ALK PHOS: 44 U/L (ref 38–126)
ALT: 8 U/L (ref 0–44)
ANION GAP: 7 (ref 5–15)
AST: 6 U/L — ABNORMAL LOW (ref 15–41)
BUN: 11 mg/dL (ref 6–20)
CALCIUM: 9.6 mg/dL (ref 8.9–10.3)
CO2: 24 mmol/L (ref 22–32)
Chloride: 109 mmol/L (ref 98–111)
Creatinine, Ser: 0.65 mg/dL (ref 0.44–1.00)
GFR calc non Af Amer: >60 mL/min (ref >60)
GLUCOSE: 100 mg/dL — AB (ref 70–99)
POTASSIUM: 3.9 mmol/L (ref 3.5–5.1)
SODIUM: 140 mmol/L (ref 135–145)
TOTAL PROTEIN: 7.1 g/dL (ref 6.5–8.1)
Total Bilirubin: 0.3 mg/dL (ref 0.3–1.2)

## 2018-11-21 LAB — ACETAMINOPHEN LEVEL

## 2018-11-21 LAB — LITHIUM LEVEL: LITHIUM LVL: 0.25 mmol/L — AB (ref 0.60–1.20)

## 2018-11-21 LAB — SALICYLATE LEVEL

## 2018-11-21 LAB — VALPROIC ACID LEVEL

## 2018-11-21 LAB — ETHANOL: Alcohol, Ethyl (B): <10 mg/dL (ref <10)

## 2018-11-21 MED ORDER — DIVALPROEX SODIUM ER 500 MG PO TB24: 1000.0000 mg | ORAL_TABLET | Freq: Every day | ORAL | Status: DC

## 2018-11-21 MED ORDER — VENLAFAXINE HCL ER 75 MG PO CP24
150.0000 mg | ORAL_CAPSULE | Freq: Every day | ORAL | Status: DC
  Administered 2018-11-22: 150 mg via ORAL
  Filled 2018-11-21: qty 2

## 2018-11-21 MED ORDER — NORGESTIM-ETH ESTRAD TRIPHASIC 0.18/0.215/0.25 MG-35 MCG PO TABS: 1.0000 | ORAL_TABLET | Freq: Every day | ORAL | Status: DC

## 2018-11-21 MED ORDER — DIVALPROEX SODIUM 500 MG PO DR TAB
500.0000 mg | DELAYED_RELEASE_TABLET | Freq: Three times a day (TID) | ORAL | Status: DC
  Administered 2018-11-22 – 2018-11-24 (×7): 500 mg via ORAL
  Filled 2018-11-21 (×7): qty 1

## 2018-11-21 MED ORDER — HYDROXYZINE HCL 25 MG PO TABS
25.0000 mg | ORAL_TABLET | ORAL | Status: DC | PRN
  Administered 2018-11-21 – 2018-11-29 (×2): 25 mg via ORAL
  Filled 2018-11-21 (×2): qty 1

## 2018-11-21 MED ORDER — DIVALPROEX SODIUM ER 500 MG PO TB24
1000.0000 mg | ORAL_TABLET | Freq: Every day | ORAL | Status: AC
  Administered 2018-11-21: 1000 mg via ORAL
  Filled 2018-11-21: qty 2

## 2018-11-21 MED ORDER — LITHIUM CARBONATE ER 450 MG PO TBCR
450.0000 mg | EXTENDED_RELEASE_TABLET | Freq: Two times a day (BID) | ORAL | Status: DC
  Administered 2018-11-21 – 2018-12-01 (×20): 450 mg via ORAL
  Filled 2018-11-21 (×20): qty 1

## 2018-11-21 MED ORDER — ACETAMINOPHEN 325 MG PO TABS: 650.0000 mg | ORAL_TABLET | Freq: Four times a day (QID) | ORAL | Status: DC | PRN

## 2018-11-21 MED ORDER — NICOTINE 14 MG/24HR TD PT24
14.0000 mg | MEDICATED_PATCH | Freq: Every day | TRANSDERMAL | Status: DC
  Administered 2018-11-21: 14 mg via TRANSDERMAL
  Filled 2018-11-21: qty 1

## 2018-11-21 MED ORDER — MAGNESIUM HYDROXIDE 400 MG/5ML PO SUSP: 30.0000 mL | Freq: Every day | ORAL | Status: DC | PRN

## 2018-11-21 MED ORDER — TRAZODONE HCL 100 MG PO TABS
100.0000 mg | ORAL_TABLET | Freq: Every evening | ORAL | Status: DC | PRN
  Administered 2018-11-21 – 2018-11-29 (×2): 100 mg via ORAL
  Filled 2018-11-21 (×2): qty 1

## 2018-11-21 MED ORDER — ALUM & MAG HYDROXIDE-SIMETH 200-200-20 MG/5ML PO SUSP: 30.0000 mL | ORAL | Status: DC | PRN

## 2018-11-21 NOTE — Plan of Care (Signed)
New admit, 43 year old female admitted to BMU for severe depression and SI thoughts with a plan to Overdose. Patient admits to using heroine, and having an up and coming court date with ex boyfriend with an assault charge. Patient states when she was last here in October she quit her job and has been living on her own. Patient lithium and Depakote levels low, and positive for Opioids. Patient has scabs all over body due to drug use, and tattoos on back  and left ankle tattoo. Safety checks to continue Q 15 minutes.

## 2018-11-21 NOTE — ED Notes (Signed)
Pt given dinner tray.

## 2018-11-21 NOTE — ED Notes (Signed)
Shoes, socks, tshirt, bra, underwear, sweat pants and hair tie into belonging bag at this time. PT also comes with backpack

## 2018-11-21 NOTE — ED Notes (Signed)
Moved to bhu 4  Consult completed

## 2018-11-21 NOTE — BH Assessment (Signed)
Assessment Note  Autumn [name] is an 43 y.o. female who presents to the ER due to having thoughts of ending her life. She reports of having the plan of overdosing on medication. Patient was seen with [facility] in the past due to similar presentation.  She was admitted to [facility].  Her relationship with her ex-boyfriend and her current drug use is the mains stressors are the cause of her SI. She admits to abusing heroin, alcohol and Adderall.  During the interview, the patient was calm, cooperative and pleasant. She was able to provide appropriate answers to the questions. She denies HI and AV/H.   Diagnosis: Bipolar   Past Medical History:  Past Medical History:  Diagnosis Date  . Allergy    Seasonal, Ultram (seizures)  . Anemia 2005  . Bipolar 1 disorder ([place])   . Glaucoma   . Seizures ([place]) 2008    Past Surgical History:  Procedure Laterality Date  . BREAST BIOPSY Right    x 2  . CESAREAN SECTION     x 2    Family History:  Family History  Problem Relation Age of Onset  . Hypertension Mother   . Hyperlipidemia Mother   . Parkinson's disease Father     Social History:  reports that she has been smoking. She has a 9.00 pack-year smoking history. She has never used smokeless tobacco. She reports current alcohol use of about 4.0 standard drinks of alcohol per week. She reports current drug use. Drug: Marijuana.  Additional Social History:  Alcohol / Drug Use Pain Medications: See PTA Prescriptions: See PTA Over the Counter: See PTA History of alcohol / drug use?: Yes Longest period of sobriety (when/how long): Unable to quantify  Negative Consequences of Use: Personal relationships Substance #1 Name of Substance 1: Herion Substance #2 Name of Substance 2: Alcohol Substance #3 Name of Substance 3: adderall  CIWA: CIWA-Ar BP: 125/79 Pulse Rate: 70 COWS:    Allergies:  Allergies  Allergen Reactions  . Haldol [Haloperidol Lactate]     Patient states that she  gets stiff muscles when taking Haldol  . Ultram [Tramadol] Other (See Comments)    Seizures    Home Medications: (Not in a hospital admission)   OB/GYN Status:  Patient's last menstrual period was 10/21/2018.  General Assessment Data Location of Assessment: [facility] ED TTS Assessment: In system Is this a Tele or Face-to-Face Assessment?: Face-to-Face Is this an Initial Assessment or a Re-assessment for this encounter?: Initial Assessment Language Other than English: No Living Arrangements: Other (Comment)(Private) What gender do you identify as?: Female Marital status: Single Pregnancy Status: No Living Arrangements: Spouse/significant other Can pt return to current living arrangement?: Yes Admission Status: Voluntary Is patient capable of signing voluntary admission?: Yes Referral Source: Self/Family/Friend Insurance type: None  Medical Screening Exam [facility] Walk-in ONLY) Medical Exam completed: Yes  Crisis Care Plan Living Arrangements: Spouse/significant other Legal Guardian: Other:(Reports of none) Name of Psychiatrist: Reports of none Name of Therapist: Reports of none  Education Status Is patient currently in school?: No Is the patient employed, unemployed or receiving disability?: Unemployed  Risk to self with the past 6 months Suicidal Ideation: Yes-Currently Present Has patient been a risk to self within the past 6 months prior to admission? : Yes Suicidal Intent: No Has patient had any suicidal intent within the past 6 months prior to admission? : No Is patient at risk for suicide?: Yes Suicidal Plan?: Yes-Currently Present Has patient had any suicidal plan within  the past 6 months prior to admission? : Yes Specify Current Suicidal Plan: Cut her wrist Access to Means: No What has been your use of drugs/alcohol within the last 12 months?: Reports of none Previous Attempts/Gestures: Yes How many times?: 2 Other Self Harm Risks: Reports of none Triggers for Past  Attempts: None known Intentional Self Injurious Behavior: None Family Suicide History: No Recent stressful life event(s): Conflict (Comment), Financial Problems, Other (Comment) Persecutory voices/beliefs?: No Depression: Yes Depression Symptoms: Insomnia, Tearfulness, Isolating, Fatigue, Guilt, Loss of interest in usual pleasures, Feeling worthless/self pity Substance abuse history and/or treatment for substance abuse?: Yes Suicide prevention information given to non-admitted patients: Not applicable  Risk to Others within the past 6 months Homicidal Ideation: No Does patient have any lifetime risk of violence toward others beyond the six months prior to admission? : No Thoughts of Harm to Others: No Current Homicidal Intent: No Current Homicidal Plan: No Access to Homicidal Means: No Identified Victim: Reports of none History of harm to others?: No Assessment of Violence: None Noted Violent Behavior Description: Reports of none Does patient have access to weapons?: No Criminal Charges Pending?: No Does patient have a court date: No Is patient on probation?: No  Psychosis Hallucinations: None noted Delusions: None noted  Mental Status Report Appearance/Hygiene: Unremarkable, In scrubs Eye Contact: Good Motor Activity: Freedom of movement, Unremarkable Speech: Logical/coherent, Unremarkable Level of Consciousness: Alert Mood: Anxious, Sad, Pleasant Affect: Appropriate to circumstance, Depressed, Sad Anxiety Level: Minimal Thought Processes: Coherent, Relevant Judgement: Unimpaired Orientation: Person, Place, Time, Situation, Appropriate for developmental age Obsessive Compulsive Thoughts/Behaviors: Minimal  Cognitive Functioning Concentration: Normal Memory: Recent Intact, Remote Intact Is patient IDD: No Insight: Fair Impulse Control: Fair Appetite: Fair Have you had any weight changes? : No Change Sleep: No Change Total Hours of Sleep: 8 Vegetative Symptoms:  None  ADLScreening [facility] Assessment Services) Patient's cognitive ability adequate to safely complete daily activities?: Yes Patient able to express need for assistance with ADLs?: Yes Independently performs ADLs?: Yes (appropriate for developmental age)  Prior Inpatient Therapy Prior Inpatient Therapy: Yes Prior Therapy Dates: 04/2018, 08/2017, 06/2017, 07/2013(06/2012, 07/2010 & 07/2009) Prior Therapy Facilty/Provider(s): [facility] Reason for Treatment: Depression  Prior Outpatient Therapy Prior Outpatient Therapy: No Does patient have Intensive In-House Services?  : No Does patient have [facility] services? : No Does patient have P4CC services?: No  ADL Screening (condition at time of admission) Patient's cognitive ability adequate to safely complete daily activities?: Yes Is the patient deaf or have difficulty hearing?: No Does the patient have difficulty seeing, even when wearing glasses/contacts?: No Does the patient have difficulty concentrating, remembering, or making decisions?: No Patient able to express need for assistance with ADLs?: Yes Does the patient have difficulty dressing or bathing?: No Independently performs ADLs?: Yes (appropriate for developmental age) Does the patient have difficulty walking or climbing stairs?: No Weakness of Legs: None Weakness of Arms/Hands: None  Home Assistive Devices/Equipment Home Assistive Devices/Equipment: None  Therapy Consults (therapy consults require a physician order) PT Evaluation Needed: No OT Evalulation Needed: No SLP Evaluation Needed: No Abuse/Neglect Assessment (Assessment to be complete while patient is alone) Abuse/Neglect Assessment Can Be Completed: Yes Physical Abuse: Denies Verbal Abuse: Denies Sexual Abuse: Denies Exploitation of patient/patient's resources: Denies Self-Neglect: Denies Values / Beliefs Cultural Requests During Hospitalization: None Spiritual Requests During Hospitalization:  None Consults Spiritual Care Consult Needed: No Social Work Consult Needed: No Regulatory affairs officer (For Healthcare) Does Patient Have a Medical Advance Directive?: No Would patient like  information on creating a medical advance directive?: No - Patient declined       Child/Adolescent Assessment Running Away Risk: Denies(Patient is an adult)  Disposition:  Disposition Initial Assessment Completed for this Encounter: Yes  On Site Evaluation by:   Reviewed with Physician:    Gunnar Fusi MS, LCAS, LPC, [place], CCSI Therapeutic Triage Specialist 11/21/2018 3:51 PM
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
## 2018-11-21 NOTE — ED Triage Notes (Signed)
PT c/o SI w/ plan to overdose on meds xfew months but has been self medicating with heroin. Pt states last use was x2days ago. PT calm and cooperative in triage, A&OX4, VSS .

## 2018-11-21 NOTE — Tx Team (Signed)
Initial Treatment Plan 11/21/2018 6:41 PM [name] [id number]    PATIENT STRESSORS: Legal issue Medication change or noncompliance Substance abuse   PATIENT STRENGTHS: Ability for insight Communication skills Motivation for treatment/growth   PATIENT IDENTIFIED PROBLEMS: Substance abuse  Medication noncompliance  Legal issues                 DISCHARGE CRITERIA:  Ability to meet basic life and health needs Improved stabilization in mood, thinking, and/or behavior Motivation to continue treatment in a less acute level of care  PRELIMINARY DISCHARGE PLAN: Attend PHP/IOP Return to previous living arrangement  PATIENT/FAMILY INVOLVEMENT: This treatment plan has been presented to and reviewed with the patient, Autumn [name], and/or family member.  The patient and family have been given the opportunity to ask questions and make suggestions.  Geraldo Docker, RN 11/21/2018, 6:41 PM

## 2018-11-21 NOTE — ED Notes (Signed)
Pt discharged to BMU. Pt has signed voluntary consent form. VS stable.  Report given to Isaias Sakai, Therapist, sports. Belongings sent with patient. Pt accepting of disposition.

## 2018-11-21 NOTE — Plan of Care (Signed)
New admission.  Problem: Education: Goal: Utilization of techniques to improve thought processes will improve Outcome: Not Progressing Goal: Knowledge of the prescribed therapeutic regimen will improve Outcome: Not Progressing   Problem: Coping: Goal: Coping ability will improve Outcome: Not Progressing Goal: Will verbalize feelings Outcome: Not Progressing   Problem: Education: Goal: Knowledge of  General Education information/materials will improve Outcome: Not Progressing Goal: Emotional status will improve Outcome: Not Progressing Goal: Mental status will improve Outcome: Not Progressing Goal: Verbalization of understanding the information provided will improve Outcome: Not Progressing

## 2018-11-21 NOTE — ED Provider Notes (Signed)
[facility] Emergency Department Provider Note       Time seen: ----------------------------------------- 1:38 PM on 11/21/2018 -----------------------------------------   I have reviewed the triage vital signs and the nursing notes.  HISTORY   Chief Complaint Suicidal    HPI Autumn [name] is a 43 y.o. female with a history of allergies, anemia, bipolar, glaucoma, seizures, substance abuse who presents to the ED for suicidal ideation with a plan to overdose on medications.  Patient states she has been self-medicating with heroin, last use was 2 days ago.  She presents calm and cooperative.  Patient feels like she is currently in withdrawal from heroin.  Past Medical History:  Diagnosis Date  . Allergy    Seasonal, Ultram (seizures)  . Anemia 2005  . Bipolar 1 disorder ([place])   . Glaucoma   . Seizures ([place]) 2008    Patient Active Problem List   Diagnosis Date Noted  . Bipolar I disorder, most recent episode depressed, severe with psychotic features ([place]) 07/05/2017  . Tobacco use disorder 06/27/2017  . 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 Opioid use disorder, severe, dependence ([place]) 06/27/2017  . Cannabis use disorder, severe, dependence ([place]) 06/27/2017  . Hx of glaucoma 10/02/2013    Past Surgical History:  Procedure Laterality Date  . BREAST BIOPSY Right    x 2  . CESAREAN SECTION     x 2    Allergies Haldol [haloperidol lactate] and Ultram [tramadol]  Social History Social History   Tobacco Use  . Smoking status: Current Every Day Smoker    Packs/day: 1.00    Years: 9.00    Pack years: 9.00  . Smokeless tobacco: Never Used  . Tobacco comment: Patient not interested  Substance Use Topics  . Alcohol use: Yes    Alcohol/week: 4.0 standard drinks    Types: 4 Cans of beer per week    Comment: Ocassional  . Drug use: Yes    Types: Marijuana    Comment: percocet -last use over 1 week ago    Review of  Systems Constitutional: Negative for fever. Cardiovascular: Negative for chest pain. Respiratory: Negative for shortness of breath. Gastrointestinal: Negative for abdominal pain, vomiting and diarrhea. Musculoskeletal: Negative for back pain. Skin: Negative for rash. Neurological: Negative for headaches, focal weakness or numbness. Psychiatric: Positive for suicidal ideation, heroin abuse  All systems negative/normal/unremarkable except as stated in the HPI  ____________________________________________   PHYSICAL EXAM:  VITAL SIGNS: ED Triage Vitals [11/21/18 1327]  Enc Vitals Group     BP 125/79     Pulse Rate 70     Resp 16     Temp 97.9 F (36.6 C)     Temp Source Oral     SpO2 98 %     Weight 115 lb (52.2 kg)     Height 5\' 2"  (1.575 m)     Head Circumference      Peak Flow      Pain Score 0     Pain Loc      Pain Edu?      Excl. in [place]?    Constitutional: Alert and oriented. Well appearing and in no distress. Cardiovascular: Normal rate, regular rhythm. No murmurs, rubs, or gallops. Respiratory: Normal respiratory effort without tachypnea nor retractions. Breath sounds are clear and equal bilaterally. No wheezes/rales/rhonchi. Gastrointestinal: Soft and nontender. Normal bowel sounds Musculoskeletal: Nontender with normal range of motion in extremities. No lower extremity tenderness nor edema. Neurologic:  Normal  speech and language. No gross focal neurologic deficits are appreciated.  Skin:  Skin is warm, dry and intact. No rash noted. Psychiatric: Mood and affect are normal. Speech and behavior are normal.  ____________________________________________  ED COURSE:  As part of my medical decision making, I reviewed the following data within the [place] History obtained from family if available, nursing notes, old chart and ekg, as well as notes from prior ED visits. Patient presented for suicidal ideation and substance abuse, we will assess with  labs as indicated at this time.   Procedures ____________________________________________   LABS (pertinent positives/negatives)  Labs Reviewed  COMPREHENSIVE METABOLIC PANEL - Abnormal; Notable for the following components:      Result Value   Glucose, Bld 100 (*)    AST 6 (*)    All other components within normal limits  CBC  ETHANOL  SALICYLATE LEVEL  ACETAMINOPHEN LEVEL  URINE DRUG SCREEN, QUALITATIVE ([facility] ONLY)  POC URINE PREG, ED    ____________________________________________   DIFFERENTIAL DIAGNOSIS   Suicidal ideation, depression, substance abuse, bipolar disorder  FINAL ASSESSMENT AND PLAN  Suicidal ideation, substance abuse   Plan: The patient had presented for SI. Patient's labs are reassuring.  She appears medically clear for psychiatric evaluation and disposition.   Laurence Aly, MD    Note: This note was generated in part or whole with voice recognition software. Voice recognition is usually quite accurate but there are transcription errors that can and very often do occur. I apologize for any typographical errors that were not detected and corrected.     Earleen Newport, MD 11/21/18 1401

## 2018-11-21 NOTE — BH Assessment (Signed)
Patient is to be admitted to [facility] by Dr. Leverne Humbles.  Attending Physician will be Dr. Bary Leriche.   Patient has been assigned to room 309, by [place] [facility] [place].   Intake Paper Work has been signed and placed on patient chart.  ER staff is aware of the admission:  Dr. Clearnce Hasten, ER MD   Amy B., Patient's Nurse   Butch Penny, Patient Access.

## 2018-11-22 ENCOUNTER — Encounter: Payer: Self-pay | Admitting: Psychiatry

## 2018-11-22 DIAGNOSIS — F314 Bipolar disorder, current episode depressed, severe, without psychotic features: Principal | ICD-10-CM

## 2018-11-22 MED ORDER — LOPERAMIDE HCL 2 MG PO CAPS: 4.0000 mg | ORAL_CAPSULE | ORAL | Status: DC | PRN

## 2018-11-22 MED ORDER — BUPRENORPHINE HCL-NALOXONE HCL 8-2 MG SL SUBL
1.0000 | SUBLINGUAL_TABLET | Freq: Every day | SUBLINGUAL | Status: DC
  Administered 2018-11-22 – 2018-11-30 (×9): 1 via SUBLINGUAL
  Filled 2018-11-22 (×9): qty 1

## 2018-11-22 MED ORDER — BUPROPION HCL ER (XL) 150 MG PO TB24
150.0000 mg | ORAL_TABLET | Freq: Every day | ORAL | Status: DC
  Administered 2018-11-22 – 2018-12-01 (×10): 150 mg via ORAL
  Filled 2018-11-22 (×10): qty 1

## 2018-11-22 MED ORDER — HALOPERIDOL 5 MG PO TABS
5.0000 mg | ORAL_TABLET | Freq: Once | ORAL | Status: AC
  Administered 2018-11-22: 5 mg via ORAL
  Filled 2018-11-22: qty 1

## 2018-11-22 MED ORDER — DIPHENHYDRAMINE HCL 25 MG PO CAPS
50.0000 mg | ORAL_CAPSULE | Freq: Once | ORAL | Status: AC
  Administered 2018-11-22: 50 mg via ORAL
  Filled 2018-11-22: qty 2

## 2018-11-22 NOTE — [facility] Group Notes (Signed)
LCSW Group Therapy Note  11/22/2018 1:00 PM  Type of Therapy/Topic:  Group Therapy:  Emotion Regulation  Participation Level:  Did Not Attend   Description of Group:   The purpose of this group is to assist patients in learning to regulate negative emotions and experience positive emotions. Patients will be guided to discuss ways in which they have been vulnerable to their negative emotions. These vulnerabilities will be juxtaposed with experiences of positive emotions or situations, and patients will be challenged to use positive emotions to combat negative ones. Special emphasis will be placed on coping with negative emotions in conflict situations, and patients will process healthy conflict resolution skills.  Therapeutic Goals: 1. Patient will identify two positive emotions or experiences to reflect on in order to balance out negative emotions 2. Patient will label two or more emotions that they find the most difficult to experience 3. Patient will demonstrate positive conflict resolution skills through discussion and/or role plays  Summary of Patient Progress:       Therapeutic Modalities:   Cognitive Behavioral Therapy Feelings Identification Dialectical Behavioral Therapy  Assunta Curtis, MSW, LCSW 11/22/2018 3:13 PM

## 2018-11-22 NOTE — Progress Notes (Signed)
Recreation Therapy Notes  Date: 11/22/2018  Time: 9:30 am  Location: Craft Room  Behavioral response: Appropriate  Intervention Topic: Coping  Discussion/Intervention:  Group content on today was focused on coping skills. The group defined what coping skills are and when they can be used. Individuals described how they normally cope with thing and the coping skills they normally use. Patients expressed why it is important to cope with things and how not coping with things can affect you. The group participated in the intervention "My coping box" and made coping boxes while adding coping skills they could use in the future to the box. Clinical Observations/Feedback:  Patient came to group late due to unknown reasons. Individual was social with peers and staff while participating in the intervention. Abbygayle Helfand LRT/CTRS          Arbell Wycoff 11/22/2018 1:25 PM
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
## 2018-11-22 NOTE — Progress Notes (Signed)
Patient alert and oriented x 4, thoughts are organized and coherent, speech is soft non pressured, she is visible in the milieu interacting appropriately with peers and staff no distress noted. Patient's affect is bright and receptive to staff, she is  complaint with medications, interacting appropriately with peers and staff, 15 minutes safety checks maintained will continue to monitor.
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
## 2018-11-22 NOTE — Progress Notes (Signed)
CSW spoke with Stephanie(team lead) at [facility] at 4pm, no IPRS openings at this time.

## 2018-11-22 NOTE — BHH Suicide Risk Assessment (Signed)
[facility] INPATIENT:  Family/Significant Other Suicide Prevention Education  Suicide Prevention Education:  Contact Attempts: Autumn [name], boyfriend [phone] has been identified by the patient as the family member/significant other with whom the patient will be residing, and identified as the person(s) who will aid the patient in the event of a mental health crisis.  With written consent from the patient, two attempts were made to provide suicide prevention education, prior to and/or following the patient's discharge.  We were unsuccessful in providing suicide prevention education.  A suicide education pamphlet was given to the patient to share with family/significant other.  Date and time of first attempt: 11/22/18 413pm Date and time of second attempt:  Autumn [name] 11/22/2018, 4:14 PM

## 2018-11-22 NOTE — Tx Team (Addendum)
Interdisciplinary Treatment and Diagnostic Plan Update  11/22/2018 Time of Session: 1030am Autumn [name] MRN: [id number]  Principal Diagnosis: Bipolar I disorder, most recent episode depressed, severe without psychotic features ([place])  Secondary Diagnoses: Principal Problem:   Bipolar I disorder, most recent episode depressed, severe without psychotic features ([place]) Active Problems:   Tobacco use disorder   Opioid use disorder, severe, dependence ([place])   Cannabis use disorder, severe, dependence ([place])   Current Medications:  Current Facility-Administered Medications  Medication Dose Route Frequency Provider Last Rate Last Dose  . acetaminophen (TYLENOL) tablet 650 mg  650 mg Oral Q6H PRN [name], MD      . alum & mag hydroxide-simeth (MAALOX/MYLANTA) 200-200-20 MG/5ML suspension 30 mL  30 mL Oral Q4H PRN [name], MD      . buprenorphine-naloxone (SUBOXONE) 8-2 mg per SL tablet 1 tablet  1 tablet Sublingual Daily [name], [name], MD      . buPROPion (WELLBUTRIN XL) 24 hr tablet 150 mg  150 mg Oral Daily [name], [name], MD      . diphenhydrAMINE (BENADRYL) capsule 50 mg  50 mg Oral Once [name], [name], MD      . divalproex (DEPAKOTE) DR tablet 500 mg  500 mg Oral Q8H [name], [name], MD   500 mg at 11/22/18 0729  . haloperidol (HALDOL) tablet 5 mg  5 mg Oral Once [name], [name], MD      . hydrOXYzine (ATARAX/VISTARIL) tablet 25 mg  25 mg Oral Q4H PRN [name], MD   25 mg at 11/21/18 2207  . lithium carbonate (ESKALITH) CR tablet 450 mg  450 mg Oral Q12H [name], MD   450 mg at 11/22/18 9024  . magnesium hydroxide (MILK OF MAGNESIA) suspension 30 mL  30 mL Oral Daily PRN [name], MD      . Norgestimate-Ethinyl Estradiol Triphasic 0.18/0.215/0.25 MG-35 MCG tablet 1 tablet  1 tablet Oral Daily [name], [name], MD      . traZODone (DESYREL) tablet 100 mg  100 mg Oral QHS PRN,MR X 1 [name], MD    100 mg at 11/21/18 2207   PTA Medications: Medications Prior to Admission  Medication Sig Dispense Refill Last Dose  . ARIPiprazole (ABILIFY) 10 MG tablet Take 1 tablet (10 mg total) by mouth daily. (Patient not taking: Reported on 11/21/2018) 7 tablet 0 Not Taking at Unknown time  . ARIPiprazole ER (ABILIFY MAINTENA) 400 MG SRER injection Inject 2 mLs (400 mg total) into the muscle every 28 (twenty-eight) days. (Patient not taking: Reported on 11/21/2018) 1 each 1 Not Taking at Unknown time  . buPROPion (WELLBUTRIN XL) 300 MG 24 hr tablet Take 300 mg by mouth daily.   11/20/2018 at 1000  . divalproex (DEPAKOTE) 500 MG DR tablet Take 1 tablet (500 mg total) by mouth every 12 (twelve) hours. 60 tablet 1 11/20/2018 at 1000  . latanoprost (XALATAN) 0.005 % ophthalmic solution Place 1 drop at bedtime into both eyes. (Patient not taking: Reported on 11/21/2018) 2.5 mL 12 Not Taking at Unknown time  . lithium carbonate (ESKALITH) 450 MG CR tablet Take 1 tablet (450 mg total) by mouth every 12 (twelve) hours. 60 tablet 1 11/20/2018 at 1000  . Norgestimate-Ethinyl Estradiol Triphasic (ORTHO TRI-CYCLEN, 28,) 0.18/0.215/0.25 MG-35 MCG tablet Take 1 tablet by mouth daily.   11/20/2018 at 1000  . pantoprazole (PROTONIX) 40 MG tablet Take 1 tablet (40 mg total) by mouth daily. (Patient  not taking: Reported on 11/21/2018) 30 tablet 1 Not Taking at Unknown time  . traZODone (DESYREL) 100 MG tablet Take 2 tablets (200 mg total) by mouth at bedtime. (Patient not taking: Reported on 11/21/2018) 60 tablet 1 Not Taking at Unknown time  . venlafaxine XR (EFFEXOR-XR) 150 MG 24 hr capsule Take 1 capsule (150 mg total) by mouth daily with breakfast. (Patient not taking: Reported on 11/21/2018) 30 capsule 1 Not Taking at Unknown time    Patient Stressors: Legal issue Medication change or noncompliance Substance abuse  Patient Strengths: Ability for insight Communication skills Motivation for treatment/growth  Treatment  Modalities: Medication Management, Group therapy, Case management,  1 to 1 session with clinician, Psychoeducation, Recreational therapy.   Physician Treatment Plan for Primary Diagnosis: Bipolar I disorder, most recent episode depressed, severe without psychotic features ([place]) Long Term Goal(s):     Short Term Goals:    Medication Management: Evaluate patient's response, side effects, and tolerance of medication regimen.  Therapeutic Interventions: 1 to 1 sessions, Unit Group sessions and Medication administration.  Evaluation of Outcomes: Not Met  Physician Treatment Plan for Secondary Diagnosis: Principal Problem:   Bipolar I disorder, most recent episode depressed, severe without psychotic features ([place]) Active Problems:   Tobacco use disorder   Opioid use disorder, severe, dependence ([place])   Cannabis use disorder, severe, dependence ([place])  Long Term Goal(s):     Short Term Goals:       Medication Management: Evaluate patient's response, side effects, and tolerance of medication regimen.  Therapeutic Interventions: 1 to 1 sessions, Unit Group sessions and Medication administration.  Evaluation of Outcomes: Not Met   RN Treatment Plan for Primary Diagnosis: Bipolar I disorder, most recent episode depressed, severe without psychotic features ([place]) Long Term Goal(s): Knowledge of disease and therapeutic regimen to maintain health will improve  Short Term Goals: Ability to participate in decision making will improve, Ability to verbalize feelings will improve, Ability to identify and develop effective coping behaviors will improve and Compliance with prescribed medications will improve  Medication Management: RN will administer medications as ordered by provider, will assess and evaluate patient's response and provide education to patient for prescribed medication. RN will report any adverse and/or side effects to prescribing provider.  Therapeutic Interventions: 1 on 1  counseling sessions, Psychoeducation, Medication administration, Evaluate responses to treatment, Monitor vital signs and CBGs as ordered, Perform/monitor CIWA, COWS, AIMS and Fall Risk screenings as ordered, Perform wound care treatments as ordered.  Evaluation of Outcomes: Not Met   LCSW Treatment Plan for Primary Diagnosis: Bipolar I disorder, most recent episode depressed, severe without psychotic features ([place]) Long Term Goal(s): Safe transition to appropriate next level of care at discharge, Engage patient in therapeutic group addressing interpersonal concerns.  Short Term Goals: Engage patient in aftercare planning with referrals and resources  Therapeutic Interventions: Assess for all discharge needs, 1 to 1 time with Social worker, Explore available resources and support systems, Assess for adequacy in community support network, Educate family and significant other(s) on suicide prevention, Complete Psychosocial Assessment, Interpersonal group therapy.  Evaluation of Outcomes: Not Met   Progress in Treatment: Attending groups: Yes. Participating in groups: Yes. Taking medication as prescribed: Yes. Toleration medication: Yes. Family/Significant other contact made: No, will contact:  CSW will contact when pt gives consent Patient understands diagnosis: Yes. Discussing patient identified problems/goals with staff: Yes. Medical problems stabilized or resolved: Yes. Denies suicidal/homicidal ideation: Yes. Issues/concerns per patient self-inventory: No. Other: NA  New problem(s) identified:  No, Describe:  None reported  New Short Term/Long Term Goal(s): "Go to an ACT team or go to [employer]"  Patient Goals:  "Go to an ACT team or go to [employer] or Barriers: Pt will return home and follow up with outpatient treatment  Reason for Continuation of Hospitalization: Medication stabilization  Estimated Length of Stay: 5-7 days    Recreational Therapy: Patient  Stressors: Relationship, Work  Patient Goal: Patient will identify 3 positive coping skills strategies to use for anxiety post d/c within 5 recreation therapy group sessions  Attendees: Patient:Autumn [name] 11/22/2018 1:01 PM  Physician: Herma Ard [name] 11/22/2018 1:01 PM  Nursing:  11/22/2018 1:01 PM  RN Care Manager: 11/22/2018 1:01 PM  Social Worker: Sanjuana Kava, [place] 11/22/2018 1:01 PM  Recreational Therapist: Roanna Epley LRT 11/22/2018 1:01 PM  Other:  11/22/2018 1:01 PM  Other:  11/22/2018 1:01 PM  Other: 11/22/2018 1:01 PM    Scribe for Treatment Team: Yvette Rack, LCSW 11/22/2018 1:01 PM

## 2018-11-22 NOTE — Progress Notes (Signed)
D- Patient alert and oriented. Patient presents in a pleasant mood on assessment stating that she slept good last night and had a few complaints of going through withdrawal, reporting "chills" to this Probation officer. Patient endorses depression and anxiety, rating them both "5/10", stating that it's the same from yesterday, in which she stated that her upcoming court date with an ex-boyfriend has her feeling this way. Patient denies SI, HI, AVH, and pain at this time. Patient's goal for today is to "try to go to groups".  A- Scheduled medications administered to patient, per MD orders. Support and encouragement provided.  Routine safety checks conducted every 15 minutes.  Patient informed to notify staff with problems or concerns.  R- No adverse drug reactions noted. Patient contracts for safety at this time. Patient compliant with medications and treatment plan. Patient receptive, calm, and cooperative. Patient interacts well with others on the unit.  Patient remains safe at this time.

## 2018-11-22 NOTE — Progress Notes (Signed)
Recreation Therapy Notes  INPATIENT RECREATION THERAPY ASSESSMENT  Patient Details Name: Sueann Brownley MRN: [id number] DOB: 1976/05/23 Today's Date: 11/22/2018       Information Obtained From: Patient  Able to Participate in Assessment/Interview: Yes  Patient Presentation: Responsive  Reason for Admission (Per Patient): Active Symptoms, Suicidal Ideation, Other (Comments)(Depression, Anxiety)  Patient Stressors: Relationship, Work  Coping Skills:   Substance Abuse, Talk, Other (Comment)(Walk)  Leisure Interests (2+):  Music - Listen, Community - Industrial/product designer of Recreation/Participation: Monthly  Awareness of Community Resources:  Yes  Community Resources:  Park  Current Use:    If no, Barriers?:    Expressed Interest in [place] of Residence:  Insurance underwriter  Patient Main Form of Transportation: Other (Comment)(My boyfriend)  Patient Strengths:  Caring, honest, open minded  Patient Identified Areas of Improvement:  Not being judemental and listen more  Patient Goal for Hospitalization:  using positive coping skills instead of negative ones  Current SI (including self-harm):  No  Current HI:  No  Current AVH: No  Staff Intervention Plan: Group Attendance, Collaborate with Interdisciplinary Treatment Team  Consent to Intern Participation: N/A  Bassel Gaskill 11/22/2018, 3:31 PM

## 2018-11-23 MED ORDER — DIPHENHYDRAMINE HCL 25 MG PO CAPS
50.0000 mg | ORAL_CAPSULE | Freq: Every day | ORAL | Status: DC
  Administered 2018-11-23 – 2018-11-30 (×8): 50 mg via ORAL
  Filled 2018-11-23 (×8): qty 2

## 2018-11-23 MED ORDER — HALOPERIDOL 5 MG PO TABS
5.0000 mg | ORAL_TABLET | Freq: Every day | ORAL | Status: DC
  Administered 2018-11-23 – 2018-11-30 (×8): 5 mg via ORAL
  Filled 2018-11-23 (×8): qty 1

## 2018-11-23 NOTE — Progress Notes (Signed)
D: Pt denies SI/HI/AVH, can contract for safety. Pt is pleasant and cooperative, but minimal. Pt. has no Complaints. Patient Interactions appropriate with staff and peers. Pt. When asked reports a mostly normal mood stating, "I'm okay", but when exploring her mood further also expresses a mild depressed and anxious mood about her past. Pt. Denies pain.   A: Q x 15 minute observation checks were completed for safety. Patient was provided with education, needs reinforcement. Patient was given/offered medications per orders. Patient  was encourage to attend groups, participate in unit activities and continue with plan of care. Pt. Chart and plans of care reviewed. Pt. Given support and encouragement.   R: Patient is complaint with medication and unit procedures. Pt. Attends snack time, eating good.             Precautionary checks every 15 minutes for safety maintained, room free of safety hazards, patient sustains no injury or falls during this shift. Will endorse care to next shift.

## 2018-11-23 NOTE — [facility] Group Notes (Signed)
Balance In Life 11/23/2018 1PM  Type of Therapy/Topic:  Group Therapy:  Balance in Life  Participation Level:  Did Not Attend  Description of Group:   This group will address the concept of balance and how it feels and looks when one is unbalanced. Patients will be encouraged to process areas in their lives that are out of balance and identify reasons for remaining unbalanced. Facilitators will guide patients in utilizing problem-solving interventions to address and correct the stressor making their life unbalanced. Understanding and applying boundaries will be explored and addressed for obtaining and maintaining a balanced life. Patients will be encouraged to explore ways to assertively make their unbalanced needs known to significant others in their lives, using other group members and facilitator for support and feedback.  Therapeutic Goals: 1. Patient will identify two or more emotions or situations they have that consume much of in their lives. 2. Patient will identify signs/triggers that life has become out of balance:  3. Patient will identify two ways to set boundaries in order to achieve balance in their lives:  4. Patient will demonstrate ability to communicate their needs through discussion and/or role plays  Summary of Patient Progress:    Therapeutic Modalities:   Cognitive Behavioral Therapy Solution-Focused Therapy Assertiveness Training  Cam Harnden T Johnmichael Melhorn, LCSW  

## 2018-11-23 NOTE — Progress Notes (Signed)
[facility] MD Progress Note  11/23/2018 12:15 AM Autumn [name]  MRN:  [id number]  Subjective:    Ms. Dearcos doing slightly better. Less irritable, took medications. Apparently she was able to tolerated a single dose of Haldol. This is her antipsychotic of choice, will continue. Sleep and appetite are fair. No side effects from medications.  Principal Problem: Bipolar I disorder, most recent episode depressed, severe without psychotic features ([place]) Diagnosis: Principal Problem:   Bipolar I disorder, most recent episode depressed, severe without psychotic features ([place]) Active Problems:   Tobacco use disorder   Opioid use disorder, severe, dependence ([name])   Cannabis use disorder, severe, dependence ([place])  Total Time spent with patient: 20 minutes  Past Psychiatric History: bipolar disorder  Past Medical History:  Past Medical History:  Diagnosis Date  . Allergy    Seasonal, Ultram (seizures)  . Anemia 2005  . Bipolar 1 disorder ([place])   . Glaucoma   . Seizures ([place]) 2008    Past Surgical History:  Procedure Laterality Date  . BREAST BIOPSY Right    x 2  . CESAREAN SECTION     x 2   Family History:  Family History  Problem Relation Age of Onset  . Hypertension Mother   . Hyperlipidemia Mother   . Parkinson's disease Father    Family Psychiatric  History: none Social History:  Social History   Substance and Sexual Activity  Alcohol Use Yes  . Alcohol/week: 4.0 standard drinks  . Types: 4 Cans of beer per week   Comment: Ocassional     Social History   Substance and Sexual Activity  Drug Use Yes  . Types: Marijuana   Comment: percocet -last use over 1 week ago    Social History   Socioeconomic History  . Marital status: Single    Spouse name: Not on file  . Number of children: Not on file  . Years of education: Not on file  . Highest education level: Not on file  Occupational History  . Not on file  Social Needs  . Financial resource strain: Not on  file  . Food insecurity:    Worry: Not on file    Inability: Not on file  . Transportation needs:    Medical: Not on file    Non-medical: Not on file  Tobacco Use  . Smoking status: Current Every Day Smoker    Packs/day: 1.00    Years: 9.00    Pack years: 9.00  . Smokeless tobacco: Never Used  . Tobacco comment: Patient not interested  Substance and Sexual Activity  . Alcohol use: Yes    Alcohol/week: 4.0 standard drinks    Types: 4 Cans of beer per week    Comment: Ocassional  . Drug use: Yes    Types: Marijuana    Comment: percocet -last use over 1 week ago  . Sexual activity: Yes    Birth control/protection: None, Pill  Lifestyle  . Physical activity:    Days per week: Not on file    Minutes per session: Not on file  . Stress: Not on file  Relationships  . Social connections:    Talks on phone: Not on file    Gets together: Not on file    Attends religious service: Not on file    Active member of club or organization: Not on file    Attends meetings of clubs or organizations: Not on file    Relationship status: Not on file  Other  Topics Concern  . Not on file  Social History Narrative  . Not on file   Additional Social History:    Pain Medications: see PTA Prescriptions: see PTA Over the Counter: see PTA History of alcohol / drug use?: Yes Longest period of sobriety (when/how long): Unknown Negative Consequences of Use: Financial, Legal, Personal relationships Name of Substance 1: Heroin   Name of Substance 3: Adderall              Sleep: Fair  Appetite:  Fair  Current Medications: Current Facility-Administered Medications  Medication Dose Route Frequency Provider Last Rate Last Dose  . acetaminophen (TYLENOL) tablet 650 mg  650 mg Oral Q6H PRN [name], MD      . alum & mag hydroxide-simeth (MAALOX/MYLANTA) 200-200-20 MG/5ML suspension 30 mL  30 mL Oral Q4H PRN [name], MD      . buprenorphine-naloxone (SUBOXONE) 8-2 mg per SL  tablet 1 tablet  1 tablet Sublingual Daily Kyriaki Moder B, MD   1 tablet at 11/22/18 1417  . buPROPion (WELLBUTRIN XL) 24 hr tablet 150 mg  150 mg Oral Daily Aiysha Jillson B, MD   150 mg at 11/22/18 1417  . divalproex (DEPAKOTE) DR tablet 500 mg  500 mg Oral Q8H Rafaella Kole B, MD   500 mg at 11/22/18 2105  . hydrOXYzine (ATARAX/VISTARIL) tablet 25 mg  25 mg Oral Q4H PRN [name], MD   25 mg at 11/21/18 2207  . lithium carbonate (ESKALITH) CR tablet 450 mg  450 mg Oral Q12H [name], MD   450 mg at 11/22/18 2105  . loperamide (IMODIUM) capsule 4 mg  4 mg Oral PRN Thurlow Gallaga B, MD      . magnesium hydroxide (MILK OF MAGNESIA) suspension 30 mL  30 mL Oral Daily PRN [name], MD      . Norgestimate-Ethinyl Estradiol Triphasic 0.18/0.215/0.25 MG-35 MCG tablet 1 tablet  1 tablet Oral Daily Kassidy Dockendorf B, MD      . traZODone (DESYREL) tablet 100 mg  100 mg Oral QHS PRN,MR X 1 [name], MD   100 mg at 11/21/18 2207    Lab Results:  Results for orders placed or performed during the hospital encounter of 11/21/18 (from the past 48 hour(s))  Comprehensive metabolic panel     Status: Abnormal   Collection Time: 11/21/18  1:31 PM  Result Value Ref Range   Sodium 140 135 - 145 mmol/L   Potassium 3.9 3.5 - 5.1 mmol/L   Chloride 109 98 - 111 mmol/L   CO2 24 22 - 32 mmol/L   Glucose, Bld 100 (H) 70 - 99 mg/dL   BUN 11 6 - 20 mg/dL   Creatinine, Ser 0.65 0.44 - 1.00 mg/dL   Calcium 9.6 8.9 - 10.3 mg/dL   Total Protein 7.1 6.5 - 8.1 g/dL   Albumin 4.1 3.5 - 5.0 g/dL   AST 6 (L) 15 - 41 U/L   ALT 8 0 - 44 U/L   Alkaline Phosphatase 44 38 - 126 U/L   Total Bilirubin 0.3 0.3 - 1.2 mg/dL   GFR calc non Af Amer >60 >60 mL/min   GFR calc Af Amer >60 >60 mL/min   Anion gap 7 5 - 15    Comment: Performed at [facility], [address], [place], [place] [address]  Ethanol     Status: None   Collection Time: 11/21/18  1:31 PM   Result  Value Ref Range   Alcohol, Ethyl (B) <10 <10 mg/dL    Comment: (NOTE) Lowest detectable limit for serum alcohol is 10 mg/dL. For medical purposes only. Performed at [facility], [address] [place], [place] [address]   Salicylate level     Status: None   Collection Time: 11/21/18  1:31 PM  Result Value Ref Range   Salicylate Lvl <6.2 2.8 - 30.0 mg/dL    Comment: Performed at [facility], [address] [place], [place] [address]  Acetaminophen level     Status: Abnormal   Collection Time: 11/21/18  1:31 PM  Result Value Ref Range   Acetaminophen (Tylenol), Serum <10 (L) 10 - 30 ug/mL    Comment: (NOTE) Therapeutic concentrations vary significantly. A range of 10-30 ug/mL  may be an effective concentration for many patients. However, some  are best treated at concentrations outside of this range. Acetaminophen concentrations >150 ug/mL at 4 hours after ingestion  and >50 ug/mL at 12 hours after ingestion are often associated with  toxic reactions. Performed at [facility], [address] [place], [place] [address]   cbc     Status: None   Collection Time: 11/21/18  1:31 PM  Result Value Ref Range   WBC 8.6 4.0 - 10.5 K/uL   RBC 4.47 3.87 - 5.11 MIL/uL   Hemoglobin 13.5 12.0 - 15.0 g/dL   HCT 41.9 36.0 - 46.0 %   MCV 93.7 80.0 - 100.0 fL   MCH 30.2 26.0 - 34.0 pg   MCHC 32.2 30.0 - 36.0 g/dL   RDW 11.9 11.5 - 15.5 %   Platelets 278 150 - 400 K/uL   nRBC 0.0 0.0 - 0.2 %    Comment: Performed at [facility], [address] [place], [place] [address]  Urine Drug Screen, Qualitative     Status: Abnormal   Collection Time: 11/21/18  1:31 PM  Result Value Ref Range   Tricyclic, Ur Screen NONE DETECTED NONE DETECTED   Amphetamines, Ur Screen NONE DETECTED NONE DETECTED   MDMA (Ecstasy)Ur Screen NONE DETECTED NONE DETECTED   Cocaine Metabolite,Ur [place] NONE DETECTED NONE DETECTED   Opiate, Ur Screen POSITIVE (A) NONE  DETECTED   Phencyclidine (PCP) Ur S NONE DETECTED NONE DETECTED   Cannabinoid 50 Ng, Ur [place] NONE DETECTED NONE DETECTED   Barbiturates, Ur Screen NONE DETECTED NONE DETECTED   Benzodiazepine, Ur Scrn NONE DETECTED NONE DETECTED   Methadone Scn, Ur NONE DETECTED NONE DETECTED    Comment: (NOTE) Tricyclics + metabolites, urine    Cutoff 1000 ng/mL Amphetamines + metabolites, urine  Cutoff 1000 ng/mL MDMA (Ecstasy), urine              Cutoff 500 ng/mL Cocaine Metabolite, urine          Cutoff 300 ng/mL Opiate + metabolites, urine        Cutoff 300 ng/mL Phencyclidine (PCP), urine         Cutoff 25 ng/mL Cannabinoid, urine                 Cutoff 50 ng/mL Barbiturates + metabolites, urine  Cutoff 200 ng/mL Benzodiazepine, urine              Cutoff 200 ng/mL Methadone, urine                   Cutoff 300 ng/mL The urine drug screen provides only a preliminary, unconfirmed analytical test result and should not  be used for non-medical purposes. Clinical consideration and professional judgment should be applied to any positive drug screen result due to possible interfering substances. A more specific alternate chemical method must be used in order to obtain a confirmed analytical result. Gas chromatography / mass spectrometry (GC/MS) is the preferred confirmat ory method. Performed at [facility], [address] [place], [place] [address]   Lithium level     Status: Abnormal   Collection Time: 11/21/18  1:31 PM  Result Value Ref Range   Lithium Lvl 0.25 (L) 0.60 - 1.20 mmol/L    Comment: Performed at [facility], [address] [place], [place] [address]  Valproic acid level     Status: Abnormal   Collection Time: 11/21/18  1:31 PM  Result Value Ref Range   Valproic Acid Lvl <10 (L) 50.0 - 100.0 ug/mL    Comment: RESULTS CHECKED SNJ Performed at [facility], [address] [place], [place] [address]   Lipid panel     Status: Abnormal   Collection  Time: 11/21/18  1:31 PM  Result Value Ref Range   Cholesterol 196 0 - 200 mg/dL   Triglycerides 97 <150 mg/dL   HDL 53 >40 mg/dL   Total CHOL/HDL Ratio 3.7 RATIO   VLDL 19 0 - 40 mg/dL   LDL Cholesterol 124 (H) 0 - 99 mg/dL    Comment:        Total Cholesterol/HDL:CHD Risk Coronary Heart Disease Risk Table                     Men   Women  1/2 Average Risk   3.4   3.3  Average Risk       5.0   4.4  2 X Average Risk   9.6   7.1  3 X Average Risk  23.4   11.0        Use the calculated Patient Ratio above and the CHD Risk Table to determine the patient's CHD Risk.        ATP III CLASSIFICATION (LDL):  <100     mg/dL   Optimal  100-129  mg/dL   Near or Above                    Optimal  130-159  mg/dL   Borderline  160-189  mg/dL   High  >190     mg/dL   Very High Performed at [facility], [address] [place], [place] [address]   Hemoglobin A1c     Status: None   Collection Time: 11/21/18  1:31 PM  Result Value Ref Range   Hgb A1c MFr Bld 5.0 4.8 - 5.6 %    Comment: (NOTE) Pre diabetes:          5.7%-6.4% Diabetes:              >6.4% Glycemic control for   <7.0% adults with diabetes    Mean Plasma Glucose 96.8 mg/dL    Comment: Performed at [facility] [address], [place], [place] [address]    Blood Alcohol level:  Lab Results  Component Value Date   [facility] <10 11/21/2018   ETH <10 13/06/6577    Metabolic Disorder Labs: Lab Results  Component Value Date   HGBA1C 5.0 11/21/2018   MPG 96.8 11/21/2018   MPG 93.93 04/13/2018   No results found for: PROLACTIN Lab Results  Component Value Date   CHOL  196 11/21/2018   TRIG 97 11/21/2018   HDL 53 11/21/2018   CHOLHDL 3.7 11/21/2018   VLDL 19 11/21/2018   LDLCALC 124 (H) 11/21/2018   LDLCALC 117 (H) 04/13/2018    Physical Findings: AIMS: Facial and Oral Movements Muscles of Facial Expression: None, normal Lips and Perioral Area: None, normal Jaw: None, normal Tongue: None,  normal,Extremity Movements Upper (arms, wrists, hands, fingers): None, normal Lower (legs, knees, ankles, toes): None, normal, Trunk Movements Neck, shoulders, hips: None, normal, Overall Severity Severity of abnormal movements (highest score from questions above): None, normal Incapacitation due to abnormal movements: None, normal Patient's awareness of abnormal movements (rate only patient's report): No Awareness, Dental Status Current problems with teeth and/or dentures?: No Does patient usually wear dentures?: No  CIWA:  CIWA-Ar Total: 0 COWS:  COWS Total Score: 0  Musculoskeletal: Strength & Muscle Tone: within normal limits Gait & Station: normal Patient leans: N/A  Psychiatric Specialty Exam: Physical Exam  Nursing note and vitals reviewed. Psychiatric: Her speech is normal and behavior is normal. Thought content is delusional. Cognition and memory are normal. She expresses impulsivity. She exhibits a depressed mood. She expresses suicidal ideation.    Review of Systems  Neurological: Negative.   Psychiatric/Behavioral: Positive for depression, substance abuse and suicidal ideas.  All other systems reviewed and are negative.   Blood pressure 109/76, pulse 83, temperature 98 F (36.7 C), temperature source Oral, resp. rate 16, height 5\' 2"  (1.575 m), weight 52.2 kg, last menstrual period 10/21/2018, SpO2 (!) 89 %.Body mass index is 21.03 kg/m.  General Appearance: Casual  Eye Contact:  Good  Speech:  Clear and Coherent  Volume:  Normal  Mood:  Depressed  Affect:  Flat  Thought Process:  Goal Directed and Descriptions of Associations: Intact  Orientation:  Full (Time, Place, and Person)  Thought Content:  Delusions  Suicidal Thoughts:  Yes.  with intent/plan  Homicidal Thoughts:  No  Memory:  Immediate;   Fair Recent;   Fair Remote;   Fair  Judgement:  Impaired  Insight:  Lacking  Psychomotor Activity:  Decreased  Concentration:  Concentration: Fair and Attention  Span: Fair  Recall:  [employer] of Knowledge:  Fair  Language:  Fair  Akathisia:  No  Handed:  Right  AIMS (if indicated):     Assets:  Communication Skills Desire for Improvement Housing Intimacy Physical Health Resilience Social Support  ADL's:  Intact  Cognition:  WNL  Sleep:  Number of Hours: 7.75     Treatment Plan Summary: Daily contact with patient to assess and evaluate symptoms and progress in treatment and Medication management   Ms. Nessel is a 43 year old female with a history of bipolar disorder admitted for suicidal ideation in the context of severe social stressors, substance abuse and medication noncompliance.  #Suicidal ideation -patient able to contract for safety in the hospital  #Mood -continue Lithium 450 mg BID, low Li level on admission 0.25 -Depakote 500 mg BID, low VPA level on admission <10 -Wellbutrin 150 mg daily -Haldol 5 mg nightly with Benadryl 50 mg nightly  -Trazodone 100 mg nightly PRN  #Opiod dependence -Suboxone taper completed  #Substance abuse treatment -declines residential treatment  #Smoking cessation -nicotine patch is available  $Labs -lipid panel, A1C are normal -EKG reviewed, NSR with QTc 400 -pregnancy test  #Disposition -discharge to home with boyfriend -follow up TBD she no longer wants to go to [facility] -[facility] team would be the best option. [facility] or [facility]  do not have IPRS spots.   Orson Slick, MD 11/23/2018, 12:15 AM

## 2018-11-23 NOTE — Progress Notes (Signed)
Recreation Therapy Notes  Date: 11/23/2018  Time: 9:30 am   Location: Craft room   Behavioral response: N/A   Intervention Topic: Happiness  Discussion/Intervention: Patient did not attend group.   Clinical Observations/Feedback:  Patient did not attend group.   Mohammed Mcandrew LRT/CTRS        Aima Mcwhirt 11/23/2018 10:52 AM

## 2018-11-23 NOTE — Plan of Care (Signed)
Patient alert and oriented x 4. Reports that she slept good last night. Per shift report, patient slept 8 hours without the use of an sleep aid. Patient reports her energy level is normal with good concentration. Rates her depression a 4 anxiety and hopelessness a 5.  Present in the milieu, goal for today is attend groups but has not attended group at this time. Compliant with treatment plan at this time. Will continue to monitor.

## 2018-11-23 NOTE — Plan of Care (Signed)
Pt. Denies si/hi/avh, can contract for safety. Pt. Complaint with medications. Pt. Reports she can remain safe while on the unit.    Problem: Health Behavior/Discharge Planning: Goal: Compliance with treatment plan for underlying cause of condition will improve Outcome: Progressing   Problem: Safety: Goal: Periods of time without injury will increase Outcome: Progressing

## 2018-11-23 NOTE — Progress Notes (Signed)
[facility] MD Progress Note  11/24/2018 10:03 AM [name]  MRN:  [id number]  Subjective:    Ms. 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 She is well known to [place] and at times, she had to be maitained on two antipsychotics and two mood stabilizers. Currently on Lithium Depakote, Wellbutrin and Haldol. Her VPA 142 today. Will hold it today, restart at 500 mg BID tomorrow. VPA level in am oredered.  Ms. Basford started responding tp medications. She is no longer irritable and short. She sleeps and eats well.She reports no side effects from medications and seems to tolerate Haldol/benadryl at night well. There is a history of dystonia with Haldol lactate injections but this is her antipsychotic of choice. She does not participate in programing.   Principal Problem: Bipolar I disorder, most recent episode depressed, severe without psychotic features ([place]) Diagnosis: Principal Problem:   Bipolar I disorder, most recent episode depressed, severe without psychotic features ([place]) Active Problems:   Tobacco use disorder   Opioid use disorder, severe, dependence ([place])   Cannabis use disorder, severe, dependence ([place])  Total Time spent with patient: 20 minutes  Past Psychiatric History: bipolar disorder  Past Medical History:  Past Medical History:  Diagnosis Date  . Allergy    Seasonal, Ultram (seizures)  . Anemia 2005  . Bipolar 1 disorder ([place])   . Glaucoma   . Seizures ([place]) 2008    Past Surgical History:  Procedure Laterality Date  . BREAST BIOPSY Right    x 2  . CESAREAN SECTION     x 2   Family History:  Family History  Problem Relation Age of Onset  . Hypertension Mother   . Hyperlipidemia Mother   . Parkinson's disease Father    Family Psychiatric  History: none Social History:  Social History   Substance and Sexual Activity  Alcohol Use Yes  . Alcohol/week: 4.0 standard drinks   . Types: 4 Cans of beer per week   Comment: Ocassional     Social History   Substance and Sexual Activity  Drug Use Yes  . Types: Marijuana   Comment: percocet -last use over 1 week ago    Social History   Socioeconomic History  . Marital status: Single    Spouse name: Not on file  . Number of children: Not on file  . Years of education: Not on file  . Highest education level: Not on file  Occupational History  . Not on file  Social Needs  . Financial resource strain: Not on file  . Food insecurity:    Worry: Not on file    Inability: Not on file  . Transportation needs:    Medical: Not on file    Non-medical: Not on file  Tobacco Use  . Smoking status: Current Every Day Smoker    Packs/day: 1.00    Years: 9.00    Pack years: 9.00  . Smokeless tobacco: Never Used  . Tobacco comment: Patient not interested  Substance and Sexual Activity  . Alcohol use: Yes    Alcohol/week: 4.0 standard drinks    Types: 4 Cans of beer per week    Comment: Ocassional  . Drug use: Yes    Types: Marijuana    Comment: percocet -last use over 1 week ago  . Sexual activity: Yes    Birth control/protection: None, Pill  Lifestyle  . Physical activity:    Days  per week: Not on file    Minutes per session: Not on file  . Stress: Not on file  Relationships  . Social connections:    Talks on phone: Not on file    Gets together: Not on file    Attends religious service: Not on file    Active member of club or organization: Not on file    Attends meetings of clubs or organizations: Not on file    Relationship status: Not on file  Other Topics Concern  . Not on file  Social History Narrative  . Not on file   Additional Social History:    Pain Medications: see PTA Prescriptions: see PTA Over the Counter: see PTA History of alcohol / drug use?: Yes Longest period of sobriety (when/how long): Unknown Negative Consequences of Use: Financial, Legal, Personal relationships Name of  Substance 1: Heroin   Name of Substance 3: Adderall              Sleep: Fair  Appetite:  Fair  Current Medications: Current Facility-Administered Medications  Medication Dose Route Frequency Provider Last Rate Last Dose  . acetaminophen (TYLENOL) tablet 650 mg  650 mg Oral Q6H PRN [name], MD      . alum & mag hydroxide-simeth (MAALOX/MYLANTA) 200-200-20 MG/5ML suspension 30 mL  30 mL Oral Q4H PRN [name], MD      . buprenorphine-naloxone (SUBOXONE) 8-2 mg per SL tablet 1 tablet  1 tablet Sublingual Daily Bryant Saye B, MD   1 tablet at 11/24/18 0829  . buPROPion (WELLBUTRIN XL) 24 hr tablet 150 mg  150 mg Oral Daily Roniqua Kintz B, MD   150 mg at 11/24/18 0829  . diphenhydrAMINE (BENADRYL) capsule 50 mg  50 mg Oral QHS Jelani Trueba B, MD   50 mg at 11/23/18 2151  . [START ON 11/25/2018] divalproex (DEPAKOTE) DR tablet 500 mg  500 mg Oral Q12H Chavonne Sforza B, MD      . haloperidol (HALDOL) tablet 5 mg  5 mg Oral QHS Krishon Adkison B, MD   5 mg at 11/23/18 2151  . hydrOXYzine (ATARAX/VISTARIL) tablet 25 mg  25 mg Oral Q4H PRN [name], MD   25 mg at 11/21/18 2207  . lithium carbonate (ESKALITH) CR tablet 450 mg  450 mg Oral Q12H [name], MD   450 mg at 11/24/18 0829  . loperamide (IMODIUM) capsule 4 mg  4 mg Oral PRN Findlay Dagher B, MD      . magnesium hydroxide (MILK OF MAGNESIA) suspension 30 mL  30 mL Oral Daily PRN [name], MD      . Norgestimate-Ethinyl Estradiol Triphasic 0.18/0.215/0.25 MG-35 MCG tablet 1 tablet  1 tablet Oral Daily Khrista Braun B, MD      . traZODone (DESYREL) tablet 100 mg  100 mg Oral QHS PRN,MR X 1 [name], MD   100 mg at 11/21/18 2207    Lab Results:  Results for orders placed or performed during the hospital encounter of 11/21/18 (from the past 48 hour(s))  Lithium level     Status: None   Collection Time: 11/24/18  7:20 AM  Result Value Ref Range    Lithium Lvl 0.80 0.60 - 1.20 mmol/L    Comment: Performed at [facility], [address] [place], [place] [address]  Valproic acid level     Status: Abnormal   Collection Time: 11/24/18  7:20 AM  Result Value Ref Range  Valproic Acid Lvl 142 (H) 50.0 - 100.0 ug/mL    Comment: Performed at [facility], [address] [place], [place] [address]  hCG, quantitative, pregnancy     Status: None   Collection Time: 11/24/18  7:20 AM  Result Value Ref Range   hCG, Beta Chain, Quant, S <1 <5 mIU/mL    Comment:          GEST. AGE      CONC.  (mIU/mL)   <=1 WEEK        5 - 50     2 WEEKS       50 - 500     3 WEEKS       100 - 10,000     4 WEEKS     1,000 - 30,000     5 WEEKS     3,500 - 115,000   6-8 WEEKS     12,000 - 270,000    12 WEEKS     15,000 - 220,000        FEMALE AND NON-PREGNANT FEMALE:     LESS THAN 5 mIU/mL Performed at [facility], [address] [place], [place] [address]     Blood Alcohol level:  Lab Results  Component Value Date   [facility] <10 11/21/2018   ETH <10 65/46/5035    Metabolic Disorder Labs: Lab Results  Component Value Date   HGBA1C 5.0 11/21/2018   MPG 96.8 11/21/2018   MPG 93.93 04/13/2018   No results found for: PROLACTIN Lab Results  Component Value Date   CHOL 196 11/21/2018   TRIG 97 11/21/2018   HDL 53 11/21/2018   CHOLHDL 3.7 11/21/2018   VLDL 19 11/21/2018   LDLCALC 124 (H) 11/21/2018   LDLCALC 117 (H) 04/13/2018    Physical Findings: AIMS: Facial and Oral Movements Muscles of Facial Expression: None, normal Lips and Perioral Area: None, normal Jaw: None, normal Tongue: None, normal,Extremity Movements Upper (arms, wrists, hands, fingers): None, normal Lower (legs, knees, ankles, toes): None, normal, Trunk Movements Neck, shoulders, hips: None, normal, Overall Severity Severity of abnormal movements (highest score from questions above): None, normal Incapacitation due to abnormal movements: None,  normal Patient's awareness of abnormal movements (rate only patient's report): No Awareness, Dental Status Current problems with teeth and/or dentures?: No Does patient usually wear dentures?: No  CIWA:  CIWA-Ar Total: 0 COWS:  COWS Total Score: 0  Musculoskeletal: Strength & Muscle Tone: within normal limits Gait & Station: normal Patient leans: N/A  Psychiatric Specialty Exam: Physical Exam  Nursing note and vitals reviewed. Psychiatric: Her speech is normal and behavior is normal. Her affect is blunt. Thought content is paranoid and delusional. Cognition and memory are normal. She expresses impulsivity.    Review of Systems  Neurological: Negative.   Psychiatric/Behavioral: Positive for depression, substance abuse and suicidal ideas.  All other systems reviewed and are negative.   Blood pressure 109/69, pulse 71, temperature 97.9 F (36.6 C), temperature source Oral, resp. rate 16, height 5\' 2"  (1.575 m), weight 52.2 kg, last menstrual period 10/21/2018, SpO2 93 %.Body mass index is 21.03 kg/m.  General Appearance: Casual  Eye Contact:  Good  Speech:  Clear and Coherent  Volume:  Normal  Mood:  Irritable  Affect:  Labile  Thought Process:  Goal Directed and Descriptions of Associations: Intact  Orientation:  Full (Time, Place, and Person)  Thought Content:  Delusions and Paranoid Ideation  Suicidal Thoughts:  No  Homicidal Thoughts:  No  Memory:  Immediate;   Fair Recent;   Fair Remote;   Fair  Judgement:  Impaired  Insight:  Lacking  Psychomotor Activity:  Decreased  Concentration:  Concentration: Fair and Attention Span: Fair  Recall:  [employer] of Knowledge:  Fair  Language:  Fair  Akathisia:  No  Handed:  Right  AIMS (if indicated):     Assets:  Communication Skills Desire for Improvement Housing Intimacy Physical Health Resilience Social Support  ADL's:  Intact  Cognition:  WNL  Sleep:  Number of Hours: 8     Treatment Plan Summary: Daily  contact with patient to assess and evaluate symptoms and progress in treatment and Medication management   Ms. Mikelson is a 43 year old female with a history of bipolar disorder admitted for suicidal ideation in the context of severe social stressors, substance abuse and medication noncompliance.  #Suicidal ideation -patient able to contract for safety in the hospital  #Mood -continue Lithium 450 mg BID, low Li level on admission 0.25 today 0.8 -hold Depakote today and restart at a lower dose 500 mg BID tomorrow, VPA today 142. Will recheck VPA level in am -Wellbutrin 150 mg daily -Haldol 5 mg nightly with Benadryl 50 mg nightly  -Trazodone 100 mg nightly PRN  #Opiod dependence -Suboxone taper completed  #Substance abuse treatment -declines residential treatment  #Smoking cessation -nicotine patch is available  $Labs -lipid panel, A1C are normal -EKG reviewed, NSR with QTc 400 -pregnancy test negative  #Disposition -discharge to home with boyfriend -follow up TBD she no longer wants to go to [place] -[facility] team would be the best option. [facility] or [facility] do not have IPRS spots.   Orson Slick, MD 11/24/2018, 10:03 AM

## 2018-11-23 NOTE — Progress Notes (Signed)
Patient is calm and cooperating with care and doing ADLs , behavior is appropriate, mood is good and affect is appropriate, compliant with her medications, minimal socialization with peers , appetite is normal ,education is provided , support and encouragement is provided , patient is safe in the unit and expected behavior in maintained, patient denies any SI/HI/AVH and 15 minute safety checks in progress, no distress noted sleep is continuous.

## 2018-11-23 NOTE — BHH Suicide Risk Assessment (Signed)
[facility] INPATIENT:  Family/Significant Other Suicide Prevention Education  Suicide Prevention Education:  Education Completed; Aida Puffer boyfriend [phone] has been identified by the patient as the family member/significant other with whom the patient will be residing, and identified as the person(s) who will aid the patient in the event of a mental health crisis (suicidal ideations/suicide attempt).  With written consent from the patient, the family member/significant other has been provided the following suicide prevention education, prior to the and/or following the discharge of the patient.  The suicide prevention education provided includes the following:  Suicide risk factors  Suicide prevention and interventions  National Suicide Hotline telephone number  [facility] assessment telephone number  [facility] Emergency Assistance [place] and/or Residential Mobile Crisis Unit telephone number  Request made of family/significant other to:  Remove weapons (e.g., guns, rifles, knives), all items previously/currently identified as safety concern.    Remove drugs/medications (over-the-counter, prescriptions, illicit drugs), all items previously/currently identified as a safety concern.  The family member/significant other verbalizes understanding of the suicide prevention education information provided.  The family member/significant other agrees to remove the items of safety concern listed above. Mr. Amie Critchley denies pt having access to guns or weapons in the home and says he has no reservation about pt returning home. He states he brought pt to the hospital due to her experiencing SI. CSW informed him should pt need further medical attention to call 911 or bring her to the nearest emergency department. Rochelle Nephew T Daeton Kluth 11/23/2018, 1:43 PM

## 2018-11-24 LAB — HCG, QUANTITATIVE, PREGNANCY

## 2018-11-24 LAB — VALPROIC ACID LEVEL: Valproic Acid Lvl: 142 ug/mL — ABNORMAL HIGH (ref 50.0–100.0)

## 2018-11-24 LAB — LITHIUM LEVEL: Lithium Lvl: 0.8 mmol/L (ref 0.60–1.20)

## 2018-11-24 MED ORDER — DIVALPROEX SODIUM 500 MG PO DR TAB
500.0000 mg | DELAYED_RELEASE_TABLET | Freq: Two times a day (BID) | ORAL | Status: DC
  Administered 2018-11-25: 500 mg via ORAL
  Filled 2018-11-24: qty 1

## 2018-11-24 NOTE — BHH Group Notes (Signed)
LCSW Group Therapy Note  11/24/2018 1:00 PM  Type of Therapy and Topic:  Group Therapy:  Feelings around Relapse and Recovery  Participation Level:  Did Not Attend   Description of Group:    Patients in this group will discuss emotions they experience before and after a relapse. They will process how experiencing these feelings, or avoidance of experiencing them, relates to having a relapse. Facilitator will guide patients to explore emotions they have related to recovery. Patients will be encouraged to process which emotions are more powerful. They will be guided to discuss the emotional reaction significant others in their lives may have to their relapse or recovery. Patients will be assisted in exploring ways to respond to the emotions of others without this contributing to a relapse.  Therapeutic Goals: 1. Patient will identify two or more emotions that lead to a relapse for them 2. Patient will identify two emotions that result when they relapse 3. Patient will identify two emotions related to recovery 4. Patient will demonstrate ability to communicate their needs through discussion and/or role plays   Summary of Patient Progress:     Therapeutic Modalities:   Cognitive Behavioral Therapy Solution-Focused Therapy Assertiveness Training Relapse Prevention Therapy   Assunta Curtis, MSW, LCSW 11/24/2018 1:56 PM

## 2018-11-24 NOTE — Progress Notes (Signed)
Recreation Therapy Notes   Date: 11/24/2018  Time: 9:30 am   Location: Craft room   Behavioral response: N/A   Intervention Topic: Self-esteem  Discussion/Intervention: Patient did not attend group.   Clinical Observations/Feedback:  Patient did not attend group.   Samaya Boardley LRT/CTRS        Esiquio Boesen 11/24/2018 10:26 AM

## 2018-11-24 NOTE — Progress Notes (Deleted)
Recreation Therapy Notes  Date: 11/24/2018  Time: 9:30 am  Location: Craft Room  Behavioral response: Appropriate  Intervention Topic: Self- esteem  Discussion/Intervention:  Group content today was focused on self-esteem. Patient defined self-esteem and where it comes form. The group described reasons self-esteem is important. Individuals stated things that impact self-esteem and positive ways to improve self-esteem. The group participated in the intervention "Collage of Me" where patients were able to create a collage of positive things that makes them who they are. Clinical Observations/Feedback:  Patient came to group late and stated he tries to laugh more to improve his self-esteem. Individual was social with peers and staff while participating in the intervention. Mily Malecki LRT/CTRS         Glorianne Proctor 11/24/2018 11:46 AM

## 2018-11-24 NOTE — Plan of Care (Signed)
Patient alert and oriented x 4. Reports that she slept good last night. Per shift report, patient slept 7 hours without the use of an sleep aid. Patient reports her energy level is normal with good concentration. Rates her depression a 5 anxiety and hopelessness a 3.  Present in the milieu, goal for today is attend groups but has not attended group at this time. Compliant with treatment plan at this time. Will continue to monitor.

## 2018-11-25 LAB — VALPROIC ACID LEVEL: Valproic Acid Lvl: 38 ug/mL — ABNORMAL LOW (ref 50.0–100.0)

## 2018-11-25 MED ORDER — DIVALPROEX SODIUM ER 500 MG PO TB24
1000.0000 mg | ORAL_TABLET | Freq: Every day | ORAL | Status: DC
  Administered 2018-11-25 – 2018-11-30 (×6): 1000 mg via ORAL
  Filled 2018-11-25 (×6): qty 2

## 2018-11-25 NOTE — Plan of Care (Signed)
Pt. Reports improved coping. Pt. Compliant with medications. Pt. Denies si/hi/avh, can contract for safety.    Problem: Coping: Goal: Coping ability will improve Outcome: Progressing   Problem: Health Behavior/Discharge Planning: Goal: Compliance with treatment plan for underlying cause of condition will improve Outcome: Progressing   Problem: Safety: Goal: Periods of time without injury will increase Outcome: Progressing

## 2018-11-25 NOTE — BHH Group Notes (Signed)
LCSW Group Therapy Note  11/25/2018 1:15pm  Type of Therapy and Topic:  Group Therapy:  Cognitive Distortions  Participation Level:  Active   Description of Group:    Patients in this group will be introduced to the topic of cognitive distortions.  Patients will identify and describe cognitive distortions, describe the feelings these distortions create for them.  Patients will identify one or more situations in their personal life where they have cognitively distorted thinking and will verbalize challenging this cognitive distortion through positive thinking skills.  Patients will practice the skill of using positive affirmations to challenge cognitive distortions using affirmation cards.    Therapeutic Goals:  1. Patient will identify two or more cognitive distortions they have used 2. Patient will identify one or more emotions that stem from use of a cognitive distortion 3. Patient will demonstrate use of a positive affirmation to counter a cognitive distortion through discussion and/or role play. 4. Patient will describe one way cognitive distortions can be detrimental to wellness   Summary of Patient Progress:The patient reported that  she feels "okay." Patients were introduced to the topic of cognitive distortions. The patient was able to identify and describe cognitive distortions, described the feelings these distortions create for  her.  Patient identified a situation in his/her personal life where he has cognitively distorted thinking and was able to verbalize and challenged this cognitive distortion through positive thinking skills. Patient was able to provide support and validation to other group members.       Therapeutic Modalities:   Cognitive Behavioral Therapy Motivational Interviewing   Novelle Addair  CUEBAS-COLON, LCSW 11/25/2018 12:44 PM

## 2018-11-25 NOTE — Progress Notes (Signed)
D: Pt. denies SI/HI/AVH, can contract for safety. Pt is pleasant and cooperative, engages appropriately. Pt. has no Complaints. Patient Interactions minimal, but appropriate with staff and peers. Pt. Reports a normal mood. No behavioral concerns to report.    A: Q x 15 minute observation checks were completed for safety. Patient was provided with education. Patient was given/offered medications per orders. Patient  was encourage to attend groups, participate in unit activities and continue with plan of care. Pt. Chart and plans of care reviewed. Pt. Given support and encouragement.   R: Patient is complaint with medication and unit procedures. Pt. Attending meals, eating good. Pt. Frequently isolative to room resting.           Precautionary checks every 15 minutes for safety maintained, room free of safety hazards, patient sustains no injury or falls during this shift. Will endorse care to next shift.

## 2018-11-25 NOTE — Progress Notes (Signed)
D - Patient was in her room upon arrival to the unit. Patient was pleasant during assessment and medication administration. Patient was isolative to her room this evening. Patient denies SI/HI/AVH and pain. Patient stated, "I still have some anxiety and depression, its better than when I first got here but I know I am not ready to discharge yet."   A - Patient was compliant with medication administration per MD orders and procedures on the unit. Patient given education. Patient given support and encouragement. Patient informed to let staff know if there are any issues or problems on the unit.   R - Patient being monitored Q 15 minutes for safety per unit protocol. Patient remains safe on the unit at this time.

## 2018-11-25 NOTE — Progress Notes (Signed)
[facility] MD Progress Note  11/25/2018 12:20 PM Autumn [name]  MRN:  [id number]  Subjective:   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 She is well known to the primary team from previous admission.  She was maitained on two antipsychotics and two mood stabilizers in the past. Currently on Lithium Depakote, Wellbutrin and Haldol.     Her VPA 142 yesterday, but is 38 today.  Pt stated that she often has a hard time remembering taking this medicine.  She agreed to change to [facility] ER 1000mg  at bedtime.  She reports feeling well today. No other complaints.   Regarding suboxone, she said that she would like to stay on it, as it helps her stay clean from opioids. She said that she wants to go to an outpatient clinic called [facility] to continue suboxone treatment after discharge.    Principal Problem: Bipolar I disorder, most recent episode depressed, severe without psychotic features ([place]) Diagnosis: Principal Problem:   Bipolar I disorder, most recent episode depressed, severe without psychotic features ([place]) Active Problems:   Tobacco use disorder   Opioid use disorder, severe, dependence ([place])   Cannabis use disorder, severe, dependence ([place])  Total Time spent with patient: 20 minutes  Past Psychiatric History: bipolar disorder, opioid use disorder  Past Medical History:  Past Medical History:  Diagnosis Date  . Allergy    Seasonal, Ultram (seizures)  . Anemia 2005  . Bipolar 1 disorder ([place])   . Glaucoma   . Seizures ([place]) 2008    Past Surgical History:  Procedure Laterality Date  . BREAST BIOPSY Right    x 2  . CESAREAN SECTION     x 2   Family History:  Family History  Problem Relation Age of Onset  . Hypertension Mother   . Hyperlipidemia Mother   . Parkinson's disease Father    Family Psychiatric  History: none Social History:  Social History   Substance and Sexual  Activity  Alcohol Use Yes  . Alcohol/week: 4.0 standard drinks  . Types: 4 Cans of beer per week   Comment: Ocassional     Social History   Substance and Sexual Activity  Drug Use Yes  . Types: Marijuana   Comment: percocet -last use over 1 week ago    Social History   Socioeconomic History  . Marital status: Single    Spouse name: Not on file  . Number of children: Not on file  . Years of education: Not on file  . Highest education level: Not on file  Occupational History  . Not on file  Social Needs  . Financial resource strain: Not on file  . Food insecurity:    Worry: Not on file    Inability: Not on file  . Transportation needs:    Medical: Not on file    Non-medical: Not on file  Tobacco Use  . Smoking status: Current Every Day Smoker    Packs/day: 1.00    Years: 9.00    Pack years: 9.00  . Smokeless tobacco: Never Used  . Tobacco comment: Patient not interested  Substance and Sexual Activity  . Alcohol use: Yes    Alcohol/week: 4.0 standard drinks    Types: 4 Cans of beer per week    Comment: Ocassional  . Drug use: Yes    Types: Marijuana    Comment: percocet -last use over 1 week ago  . Sexual  activity: Yes    Birth control/protection: None, Pill  Lifestyle  . Physical activity:    Days per week: Not on file    Minutes per session: Not on file  . Stress: Not on file  Relationships  . Social connections:    Talks on phone: Not on file    Gets together: Not on file    Attends religious service: Not on file    Active member of club or organization: Not on file    Attends meetings of clubs or organizations: Not on file    Relationship status: Not on file  Other Topics Concern  . Not on file  Social History Narrative  . Not on file   Additional Social History:    Pain Medications: see PTA Prescriptions: see PTA Over the Counter: see PTA History of alcohol / drug use?: Yes Longest period of sobriety (when/how long): Unknown Negative  Consequences of Use: Financial, Legal, Personal relationships Name of Substance 1: Heroin   Name of Substance 3: Adderall  Sleep: Fair  Appetite:  Fair  Current Medications: Current Facility-Administered Medications  Medication Dose Route Frequency Provider Last Rate Last Dose  . acetaminophen (TYLENOL) tablet 650 mg  650 mg Oral Q6H PRN [name], MD      . alum & mag hydroxide-simeth (MAALOX/MYLANTA) 200-200-20 MG/5ML suspension 30 mL  30 mL Oral Q4H PRN [name], MD      . buprenorphine-naloxone (SUBOXONE) 8-2 mg per SL tablet 1 tablet  1 tablet Sublingual Daily [name], [name], MD   1 tablet at 11/25/18 0825  . buPROPion (WELLBUTRIN XL) 24 hr tablet 150 mg  150 mg Oral Daily [name], [name], MD   150 mg at 11/25/18 0825  . diphenhydrAMINE (BENADRYL) capsule 50 mg  50 mg Oral QHS [name], [name], MD   50 mg at 11/24/18 2144  . divalproex (DEPAKOTE) DR tablet 500 mg  500 mg Oral Q12H [name], [name], MD   500 mg at 11/25/18 0825  . haloperidol (HALDOL) tablet 5 mg  5 mg Oral QHS [name], [name], MD   5 mg at 11/24/18 2144  . hydrOXYzine (ATARAX/VISTARIL) tablet 25 mg  25 mg Oral Q4H PRN [name], MD   25 mg at 11/21/18 2207  . lithium carbonate (ESKALITH) CR tablet 450 mg  450 mg Oral Q12H [name], MD   450 mg at 11/25/18 0825  . loperamide (IMODIUM) capsule 4 mg  4 mg Oral PRN [name], [name], MD      . magnesium hydroxide (MILK OF MAGNESIA) suspension 30 mL  30 mL Oral Daily PRN [name], MD      . Norgestimate-Ethinyl Estradiol Triphasic 0.18/0.215/0.25 MG-35 MCG tablet 1 tablet  1 tablet Oral Daily [name], [name], MD      . traZODone (DESYREL) tablet 100 mg  100 mg Oral QHS PRN,MR X 1 [name], MD   100 mg at 11/21/18 2207    Lab Results:  Results for orders placed or performed during the hospital encounter of 11/21/18 (from the past 48 hour(s))  Lithium level     Status: None   Collection  Time: 11/24/18  7:20 AM  Result Value Ref Range   Lithium Lvl 0.80 0.60 - 1.20 mmol/L    Comment: Performed at [facility], [address], [place], [place] [address]  Valproic acid level     Status: Abnormal   Collection Time: 11/24/18  7:20  AM  Result Value Ref Range   Valproic Acid Lvl 142 (H) 50.0 - 100.0 ug/mL    Comment: Performed at [facility], [address] [place], [place] [address]  hCG, quantitative, pregnancy     Status: None   Collection Time: 11/24/18  7:20 AM  Result Value Ref Range   hCG, Beta Chain, Quant, S <1 <5 mIU/mL    Comment:          GEST. AGE      CONC.  (mIU/mL)   <=1 WEEK        5 - 50     2 WEEKS       50 - 500     3 WEEKS       100 - 10,000     4 WEEKS     1,000 - 30,000     5 WEEKS     3,500 - 115,000   6-8 WEEKS     12,000 - 270,000    12 WEEKS     15,000 - 220,000        FEMALE AND NON-PREGNANT FEMALE:     LESS THAN 5 mIU/mL Performed at [facility] [address] [place],  [address]   Valproic acid level     Status: Abnormal   Collection Time: 11/25/18  6:52 AM  Result Value Ref Range   Valproic Acid Lvl 38 (L) 50.0 - 100.0 ug/mL    Comment: Performed at [facility], [address] [place],  [address]    Blood Alcohol level:  Lab Results  Component Value Date   [facility] <10 11/21/2018   ETH <10 02/58/5277    Metabolic Disorder Labs: Lab Results  Component Value Date   HGBA1C 5.0 11/21/2018   MPG 96.8 11/21/2018   MPG 93.93 04/13/2018   No results found for: PROLACTIN Lab Results  Component Value Date   CHOL 196 11/21/2018   TRIG 97 11/21/2018   HDL 53 11/21/2018   CHOLHDL 3.7 11/21/2018   VLDL 19 11/21/2018   LDLCALC 124 (H) 11/21/2018   LDLCALC 117 (H) 04/13/2018    Physical Findings: AIMS: Facial and Oral Movements Muscles of Facial Expression: None, normal Lips and Perioral Area: None, normal Jaw: None, normal Tongue: None, normal,Extremity Movements Upper  (arms, wrists, hands, fingers): None, normal Lower (legs, knees, ankles, toes): None, normal, Trunk Movements Neck, shoulders, hips: None, normal, Overall Severity Severity of abnormal movements (highest score from questions above): None, normal Incapacitation due to abnormal movements: None, normal Patient's awareness of abnormal movements (rate only patient's report): No Awareness, Dental Status Current problems with teeth and/or dentures?: No Does patient usually wear dentures?: No  CIWA:  CIWA-Ar Total: 0 COWS:  COWS Total Score: 0  Musculoskeletal: Strength & Muscle Tone: within normal limits Gait & Station: normal Patient leans: N/A  Psychiatric Specialty Exam: Physical Exam  Nursing note and vitals reviewed. Psychiatric: Her speech is normal and behavior is normal. Her affect is blunt. Thought content is paranoid. Cognition and memory are normal. She expresses impulsivity.    Review of Systems  Neurological: Negative.   Psychiatric/Behavioral: Positive for depression, substance abuse and suicidal ideas.  All other systems reviewed and are negative.   Blood pressure 93/67, pulse 68, temperature 98.2 F (36.8 C), temperature source Oral, resp. rate 16, height 5\' 2"  (1.575 m), weight 52.2 kg, last menstrual period 10/21/2018, SpO2 100 %.Body mass index is 21.03 kg/m.  General Appearance: Casual  Eye Contact:  Good  Speech:  Clear and Coherent  Volume:  Normal  Mood:  Depressed  Affect:  Appropriate, Blunt, Congruent and Restricted  Thought Process:  Goal Directed and Descriptions of Associations: Intact  Orientation:  Full (Time, Place, and Person)  Thought Content:  Delusions  Suicidal Thoughts:  No  Homicidal Thoughts:  No  Memory:  Immediate;   Fair Recent;   Fair Remote;   Fair  Judgement:  Impaired  Insight:  Lacking  Psychomotor Activity:  Decreased  Concentration:  Concentration: Fair and Attention Span: Fair  Recall:  [employer] of Knowledge:  Fair   Language:  Fair  Akathisia:  No  Handed:  Right  AIMS (if indicated):     Assets:  Communication Skills Desire for Improvement Housing Intimacy Physical Health Resilience Social Support  ADL's:  Intact  Cognition:  WNL  Sleep:  Number of Hours: 8     Treatment Plan Summary: Daily contact with patient to assess and evaluate symptoms and progress in treatment and Medication management   Ms. Funk is a 43 year old female with a history of bipolar disorder, opioid use disorder admitted for suicidal ideation in the context of severe social stressors, substance abuse and medication noncompliance.  #Suicidal ideation -patient able to contract for safety in the hospital  #Mood -continue Lithium 450 mg BID, low Li level on admission 0.25 today 0.8 -change Depakote DR to ED, and combined it to 1000mg  qhs, to provide more consistent VPA level, and increase compliance.  Her VPA was 142 yesterday, but 38 today.  Will need to recheck VPA after 3-4 consistent doses.  -Wellbutrin 150 mg daily -Haldol 5 mg nightly with Benadryl 50 mg nightly  -Trazodone 100 mg nightly PRN  #Opiod dependence - Suboxone taper was never initiated.  - at this time, pt wants to be maintained on this medicine, at suboxone 8-2mg  daily.  She is a good candidate for suboxone maintenance treatment as long as she has an outpatient followup either at a suboxone clinic or a methadone clinic (OTP), which dispenses suboxone.   #Substance abuse treatment -declines residential treatment, encouraged SAIOP if possible.   #Smoking cessation -nicotine patch is available  $Labs -lipid panel, A1C are normal -EKG reviewed, NSR with QTc 400 -pregnancy test negative  #Disposition -discharge to home with boyfriend -follow up TBD she no longer wants to go to [facility]. Pt said that she wants to go to [place] for suboxone treatment.  -[facility] team would be the best option. [facility] or [facility] do not have IPRS spots.   Allison Deshotels,  MD 11/25/2018, 12:20 PM

## 2018-11-25 NOTE — Plan of Care (Signed)
Patient compliant with medication administration and procedures on the unit. Patient stated that she is feeling better but feels she isn't ready to be discharged yet.   Problem: Education: Goal: Knowledge of the prescribed therapeutic regimen will improve Outcome: Progressing   Problem: Education: Goal: Emotional status will improve Outcome: Progressing

## 2018-11-26 NOTE — [facility] Group Notes (Signed)
[facility] Group Notes:  (Nursing/MHT/Case Management/Adjunct)  Date:  11/26/2018  Time:  9:14 PM  Type of Therapy:  Group Therapy  Participation Level:  Did Not Attend  Participation Quality:  In bed sleep.  Nehemiah Settle 11/26/2018, 9:14 PM

## 2018-11-26 NOTE — Progress Notes (Signed)
D- Patient alert and oriented. Patient presents in a pleasant mood on assessment stating that she slept good last night and had no major complaints to voice to this Probation officer.Patient reported that her depression is getting better, rating it a "2/10". Patient states that her anxiety is "3/10", stating that "just getting out of here and what I'm going to do" is making her anxious. Patient denies SI, HI, AVH, and pain at this time. Patient's goal for today is to "journal more" and "going to group".  A- Scheduled medications administered to patient, per MD orders. Support and encouragement provided.  Routine safety checks conducted every 15 minutes.  Patient informed to notify staff with problems or concerns.  R- No adverse drug reactions noted. Patient contracts for safety at this time. Patient compliant with medications and treatment plan. Patient receptive, calm, and cooperative. Patient interacts well with others on the unit.  Patient remains safe at this time.

## 2018-11-26 NOTE — Plan of Care (Signed)
Patient has verbalized understanding of the general information as well as her prescribed therapeutic regimen that's been provided to her and all questions/concerns have been addressed and answered at this time. Patient continues to isolate to her room after meals and sleep on and off throughout the day. Patient denies SI/HI/AVH, however, she rated her depression a "2/10" stating that "it's better", and rates her anxiety a "3/10" stating that "just getting out of here and what I'm going to do" is making her anxious. Patient has been in compliance with her treatment plan and has been free from injury thus far. Patient remains safe on the unit at this time.  Problem: Education: Goal: Utilization of techniques to improve thought processes will improve Outcome: Progressing Goal: Knowledge of the prescribed therapeutic regimen will improve Outcome: Progressing   Problem: Coping: Goal: Coping ability will improve Outcome: Progressing Goal: Will verbalize feelings Outcome: Progressing   Problem: Education: Goal: Knowledge of [facility] Education information/materials will improve Outcome: Progressing Goal: Emotional status will improve Outcome: Progressing Goal: Mental status will improve Outcome: Progressing Goal: Verbalization of understanding the information provided will improve Outcome: Progressing   Problem: Health Behavior/Discharge Planning: Goal: Compliance with treatment plan for underlying cause of condition will improve Outcome: Progressing   Problem: Safety: Goal: Periods of time without injury will increase Outcome: Progressing

## 2018-11-26 NOTE — Plan of Care (Signed)
Patient complaint with medication administration and procedures on the unit.   Problem: Education: Goal: Knowledge of  General Education information/materials will improve Outcome: Progressing   Problem: Health Behavior/Discharge Planning: Goal: Compliance with treatment plan for underlying cause of condition will improve Outcome: Progressing

## 2018-11-26 NOTE — Progress Notes (Signed)
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 She is well known to the primary team from previous admission.  She was maitained on two antipsychotics and two mood stabilizers in the past. Currently on Lithium Depakote, Wellbutrin and Haldol.     Her VPA 142 was on 1/17, but was 38 on 1/18.  Pt wanted to change to Depkaote ER 1000mg  at bedtime to help with compliance.   Today, she reports things are going well. She is feeling better.  She denied Si or Hi. She tolerates her meds well. She agreed to be compliant with treatment recommendation this time. She continues to work to stay on suboxone, as it helps with her opioid addiction.  She said today that she wants our CM to help her connect to a suboxone clinic at discharge.    Principal Problem: Bipolar I disorder, most recent episode depressed, severe without psychotic features ([place]) Diagnosis: Principal Problem:   Bipolar I disorder, most recent episode depressed, severe without psychotic features ([place]) Active Problems:   Tobacco use disorder   Opioid use disorder, severe, dependence ([place])   Cannabis use disorder, severe, dependence ([place])  Total Time spent with patient: 20 minutes  Past Psychiatric History: bipolar disorder, opioid use disorder  Past Medical History:  Past Medical History:  Diagnosis Date  . Allergy    Seasonal, Ultram (seizures)  . Anemia 2005  . Bipolar 1 disorder ([place])   . Glaucoma   . Seizures ([place]) 2008    Past Surgical History:  Procedure Laterality Date  . BREAST BIOPSY Right    x 2  . CESAREAN SECTION     x 2   Family History:  Family History  Problem Relation Age of Onset  . Hypertension Mother   . Hyperlipidemia Mother   . Parkinson's disease Father    Family Psychiatric  History: none Social History:  Social  History   Substance and Sexual Activity  Alcohol Use Yes  . Alcohol/week: 4.0 standard drinks  . Types: 4 Cans of beer per week   Comment: Ocassional     Social History   Substance and Sexual Activity  Drug Use Yes  . Types: Marijuana   Comment: percocet -last use over 1 week ago    Social History   Socioeconomic History  . Marital status: Single    Spouse name: Not on file  . Number of children: Not on file  . Years of education: Not on file  . Highest education level: Not on file  Occupational History  . Not on file  Social Needs  . Financial resource strain: Not on file  . Food insecurity:    Worry: Not on file    Inability: Not on file  . Transportation needs:    Medical: Not on file    Non-medical: Not on file  Tobacco Use  . Smoking status: Current Every Day Smoker    Packs/day: 1.00    Years: 9.00    Pack years: 9.00  . Smokeless tobacco: Never Used  . Tobacco comment: Patient not interested  Substance and Sexual Activity  . Alcohol use: Yes    Alcohol/week: 4.0 standard drinks    Types: 4 Cans of beer per week    Comment: Ocassional  . Drug use: Yes    Types: Marijuana  Comment: percocet -last use over 1 week ago  . Sexual activity: Yes    Birth control/protection: None, Pill  Lifestyle  . Physical activity:    Days per week: Not on file    Minutes per session: Not on file  . Stress: Not on file  Relationships  . Social connections:    Talks on phone: Not on file    Gets together: Not on file    Attends religious service: Not on file    Active member of club or organization: Not on file    Attends meetings of clubs or organizations: Not on file    Relationship status: Not on file  Other Topics Concern  . Not on file  Social History Narrative  . Not on file   Additional Social History:    Pain Medications: see PTA Prescriptions: see PTA Over the Counter: see PTA History of alcohol / drug use?: Yes Longest period of sobriety (when/how  long): Unknown Negative Consequences of Use: Financial, Legal, Personal relationships Name of Substance 1: Heroin   Name of Substance 3: Adderall  Sleep: Fair  Appetite:  Fair  Current Medications: Current Facility-Administered Medications  Medication Dose Route Frequency Provider Last Rate Last Dose  . acetaminophen (TYLENOL) tablet 650 mg  650 mg Oral Q6H PRN [name], MD      . alum & mag hydroxide-simeth (MAALOX/MYLANTA) 200-200-20 MG/5ML suspension 30 mL  30 mL Oral Q4H PRN [name], MD      . buprenorphine-naloxone (SUBOXONE) 8-2 mg per SL tablet 1 tablet  1 tablet Sublingual Daily [name], [name], MD   1 tablet at 11/26/18 0855  . buPROPion (WELLBUTRIN XL) 24 hr tablet 150 mg  150 mg Oral Daily [name], [name], MD   150 mg at 11/26/18 0855  . diphenhydrAMINE (BENADRYL) capsule 50 mg  50 mg Oral QHS [name], [name], MD   50 mg at 11/25/18 2212  . divalproex (DEPAKOTE ER) 24 hr tablet 1,000 mg  1,000 mg Oral QHS Tremeka Helbling, MD   1,000 mg at 11/25/18 2212  . haloperidol (HALDOL) tablet 5 mg  5 mg Oral QHS [name], [name], MD   5 mg at 11/25/18 2212  . hydrOXYzine (ATARAX/VISTARIL) tablet 25 mg  25 mg Oral Q4H PRN [name], MD   25 mg at 11/21/18 2207  . lithium carbonate (ESKALITH) CR tablet 450 mg  450 mg Oral Q12H [name], MD   450 mg at 11/26/18 0855  . loperamide (IMODIUM) capsule 4 mg  4 mg Oral PRN [name], [name], MD      . magnesium hydroxide (MILK OF MAGNESIA) suspension 30 mL  30 mL Oral Daily PRN [name], MD      . Norgestimate-Ethinyl Estradiol Triphasic 0.18/0.215/0.25 MG-35 MCG tablet 1 tablet  1 tablet Oral Daily [name], [name], MD      . traZODone (DESYREL) tablet 100 mg  100 mg Oral QHS PRN,MR X 1 [name], MD   100 mg at 11/21/18 2207    Lab Results:  Results for orders placed or performed during the hospital encounter of 11/21/18 (from the past 48 hour(s))  Valproic acid level      Status: Abnormal   Collection Time: 11/25/18  6:52 AM  Result Value Ref Range   Valproic Acid Lvl 38 (L) 50.0 - 100.0 ug/mL    Comment: Performed at [facility], [address] [place], [place] [address]  Blood Alcohol level:  Lab Results  Component Value Date   ETH <10 11/21/2018   ETH <10 29/52/8413    Metabolic Disorder Labs: Lab Results  Component Value Date   HGBA1C 5.0 11/21/2018   MPG 96.8 11/21/2018   MPG 93.93 04/13/2018   No results found for: PROLACTIN Lab Results  Component Value Date   CHOL 196 11/21/2018   TRIG 97 11/21/2018   HDL 53 11/21/2018   CHOLHDL 3.7 11/21/2018   VLDL 19 11/21/2018   LDLCALC 124 (H) 11/21/2018   LDLCALC 117 (H) 04/13/2018    Physical Findings: AIMS: Facial and Oral Movements Muscles of Facial Expression: None, normal Lips and Perioral Area: None, normal Jaw: None, normal Tongue: None, normal,Extremity Movements Upper (arms, wrists, hands, fingers): None, normal Lower (legs, knees, ankles, toes): None, normal, Trunk Movements Neck, shoulders, hips: None, normal, Overall Severity Severity of abnormal movements (highest score from questions above): None, normal Incapacitation due to abnormal movements: None, normal Patient's awareness of abnormal movements (rate only patient's report): No Awareness, Dental Status Current problems with teeth and/or dentures?: No Does patient usually wear dentures?: No  CIWA:  CIWA-Ar Total: 0 COWS:  COWS Total Score: 0  Musculoskeletal: Strength & Muscle Tone: within normal limits Gait & Station: normal Patient leans: N/A  Psychiatric Specialty Exam: Physical Exam  Nursing note and vitals reviewed. Psychiatric: Her speech is normal and behavior is normal. Judgment normal. Her affect is blunt. Cognition and memory are normal.    Review of Systems  Neurological: Negative.   Psychiatric/Behavioral: Positive for depression and substance abuse.  All other systems reviewed and  are negative.   Blood pressure 96/61, pulse 67, temperature 98.5 F (36.9 C), temperature source Oral, resp. rate 12, height 5\' 2"  (1.575 m), weight 52.2 kg, last menstrual period 10/21/2018, SpO2 97 %.Body mass index is 21.03 kg/m.  General Appearance: Casual  Eye Contact:  Good  Speech:  Clear and Coherent  Volume:  Normal  Mood:  Depressed  Affect:  Appropriate, Blunt, Congruent and Restricted  Thought Process:  Goal Directed and Descriptions of Associations: Intact  Orientation:  Full (Time, Place, and Person)  Thought Content:  Delusions  Suicidal Thoughts:  No  Homicidal Thoughts:  No  Memory:  Immediate;   Fair Recent;   Fair Remote;   Fair  Judgement:  Impaired  Insight:  Lacking  Psychomotor Activity:  Decreased  Concentration:  Concentration: Fair and Attention Span: Fair  Recall:  [employer] of Knowledge:  Fair  Language:  Fair  Akathisia:  No  Handed:  Right  AIMS (if indicated):     Assets:  Communication Skills Desire for Improvement Housing Intimacy Physical Health Resilience Social Support  ADL's:  Intact  Cognition:  WNL  Sleep:  Number of Hours: 7.75     Treatment Plan Summary: Daily contact with patient to assess and evaluate symptoms and progress in treatment and Medication management   Autumn [name] is a 43 year old female with a history of bipolar disorder, opioid use disorder admitted for suicidal ideation in the context of severe social stressors, substance abuse and medication noncompliance.  #Suicidal ideation -patient able to contract for safety in the hospital  #Mood -continue Lithium 450 mg BID, low Li level on admission 0.25 today 0.8 -changed Depakote DR to ED yesterday, and combined it to 1000mg  qhs, to provide more consistent VPA level, and increase compliance.  She seems to tolerate well.  Her VPA was 142 on 1/17,  but 38 on  1/18.  Will need to recheck VPA after 3-4 consistent doses.  -Wellbutrin 150 mg daily -Haldol 5 mg nightly  with Benadryl 50 mg nightly  -Trazodone 100 mg nightly PRN  #Opiod dependence - Suboxone taper was never initiated.  - at this time, pt wants to be maintained on this medicine, at suboxone 8-2mg  daily.  She is a good candidate for suboxone maintenance treatment as long as she has an outpatient followup either at a suboxone clinic or a methadone clinic (OTP), which dispenses suboxone.   #Substance abuse treatment -declines residential treatment, encouraged SAIOP if possible.   #Smoking cessation -nicotine patch is available  $Labs -lipid panel, A1C are normal -EKG reviewed, NSR with QTc 400 -pregnancy test negative  #Disposition -discharge to home with boyfriend -follow up TBD she no longer wants to go to [facility]. Pt said that she wants to go to [facility] for [employer] treatment, or another suboxone clinic or OTP (opioid treatment program).  -[facility] team would be the best option. [facility] or [facility] do not have IPRS spots.   Autumn Starkel, MD 11/26/2018, 1:26 PM

## 2018-11-26 NOTE — [facility] Group Notes (Signed)
LCSW Group Therapy Note 11/26/2018 1:15pm  Type of Therapy and Topic: Group Therapy: Feelings Around Returning Home & Establishing a Supportive Framework and Supporting Oneself When Supports Not Available  Participation Level: Did Not Attend  Description of Group:  Patients first processed thoughts and feelings about upcoming discharge. These included fears of upcoming changes, lack of change, new living environments, judgements and expectations from others and overall stigma of mental health issues. The group then discussed the definition of a supportive framework, what that looks and feels like, and how do to discern it from an unhealthy non-supportive network. The group identified different types of supports as well as what to do when your family/friends are less than helpful or unavailable  Therapeutic Goals  1. Patient will identify one healthy supportive network that they can use at discharge. 2. Patient will identify one factor of a supportive framework and how to tell it from an unhealthy network. 3. Patient able to identify one coping skill to use when they do not have positive supports from others. 4. Patient will demonstrate ability to communicate their needs through discussion and/or role plays.  Summary of Patient Progress:  Pt was invited to attend group but chose not to attend. CSW will continue to encourage pt to attend group throughout their admission.   Therapeutic Modalities Cognitive Behavioral Therapy Motivational Interviewing   Autumn Bielby  CUEBAS-COLON, LCSW 11/26/2018 9:13 AM  

## 2018-11-26 NOTE — Plan of Care (Signed)
Anxious and depressed but pleasant and cooperative. Visible in the milieu. Compliant with treatment.

## 2018-11-26 NOTE — Progress Notes (Signed)
D - Patient was in her room upon arrival to the unit. Patient was pleasant during assessment and medication administration. Patient was isolative to her room this evening. Patient denies SI/HI/AVH and pain. Patient stated, "I had a good day and I slept good last night. I have just felt like being bed today though, probably because of the weather."   A - Patient was compliant with medication administration per MD orders and procedures on the unit. Patient given education. Patient given support and encouragement. Patient informed to let staff know if there are any issues or problems on the unit.   R - Patient being monitored Q 15 minutes for safety per unit protocol. Patient remains safe on the unit at this time.

## 2018-11-27 NOTE — Progress Notes (Signed)
D: Pt denies SI/HI/AVH, can contract for safety. Pt is pleasant and cooperative, engages appropriately. Pt. has no Complaints. Patient Interactions minimal, but appropriate. Pt. Still mostly isolative and withdrawn to room resting. Pt. Reports low anxiety and depression. Pt. Reports a mostly normal mood.   A: Q x 15 minute observation checks were completed for safety. Patient was provided with education, but needs reinforcement. Patient was given/offered medications per orders. Patient  was encourage to attend groups, participate in unit activities and continue with plan of care. Pt. Chart and plans of care reviewed. Pt. Given support and encouragement.   R: Patient is complaint with medication and unit procedures. Pt. Attends more activities and groups today. Pt. Eating good, attends all meals.             Precautionary checks every 15 minutes for safety maintained, room free of safety hazards, patient sustains no injury or falls during this shift. Will endorse care to next shift.

## 2018-11-27 NOTE — Progress Notes (Signed)
Autumn [name] was in room at the beginning of this shift. Patient  attend group and had a snack. Medications were taken to her in room: alert and oriented. Pleasant and cooperative. Admitted to feeling "down" but denying suicidal thoughts. Denying hallucinations. Has been sleeping  And appears to be comfortable in bed. Safety precautions maintained.

## 2018-11-27 NOTE — Tx Team (Signed)
Interdisciplinary Treatment and Diagnostic Plan Update  11/27/2018 Time of Session: 830am Autumn [name] MRN: [id number]  Principal Diagnosis: Bipolar I disorder, most recent episode depressed, severe without psychotic features ([place])  Secondary Diagnoses: Principal Problem:   Bipolar I disorder, most recent episode depressed, severe without psychotic features ([place]) Active Problems:   Tobacco use disorder   Opioid use disorder, severe, dependence ([place])   Cannabis use disorder, severe, dependence ([place])   Current Medications:  Current Facility-Administered Medications  Medication Dose Route Frequency Provider Last Rate Last Dose  . acetaminophen (TYLENOL) tablet 650 mg  650 mg Oral Q6H PRN [name], MD      . alum & mag hydroxide-simeth (MAALOX/MYLANTA) 200-200-20 MG/5ML suspension 30 mL  30 mL Oral Q4H PRN [name], MD      . buprenorphine-naloxone (SUBOXONE) 8-2 mg per SL tablet 1 tablet  1 tablet Sublingual Daily [name], [name], MD   1 tablet at 11/27/18 0740  . buPROPion (WELLBUTRIN XL) 24 hr tablet 150 mg  150 mg Oral Daily [name], [name], MD   150 mg at 11/27/18 0740  . diphenhydrAMINE (BENADRYL) capsule 50 mg  50 mg Oral QHS [name], [name], MD   50 mg at 11/26/18 2205  . divalproex (DEPAKOTE ER) 24 hr tablet 1,000 mg  1,000 mg Oral QHS He, [name], MD   1,000 mg at 11/26/18 2205  . haloperidol (HALDOL) tablet 5 mg  5 mg Oral QHS [name], [name], MD   5 mg at 11/26/18 2205  . hydrOXYzine (ATARAX/VISTARIL) tablet 25 mg  25 mg Oral Q4H PRN [name], MD   25 mg at 11/21/18 2207  . lithium carbonate (ESKALITH) CR tablet 450 mg  450 mg Oral Q12H [name], MD   450 mg at 11/27/18 0740  . magnesium hydroxide (MILK OF MAGNESIA) suspension 30 mL  30 mL Oral Daily PRN [name], MD      . Norgestimate-Ethinyl Estradiol Triphasic 0.18/0.215/0.25 MG-35 MCG tablet 1 tablet  1 tablet Oral Daily [name], [name], MD      .  traZODone (DESYREL) tablet 100 mg  100 mg Oral QHS PRN,MR X 1 [name], MD   100 mg at 11/21/18 2207   PTA Medications: Medications Prior to Admission  Medication Sig Dispense Refill Last Dose  . ARIPiprazole (ABILIFY) 10 MG tablet Take 1 tablet (10 mg total) by mouth daily. (Patient not taking: Reported on 11/21/2018) 7 tablet 0 Not Taking at Unknown time  . ARIPiprazole ER (ABILIFY MAINTENA) 400 MG SRER injection Inject 2 mLs (400 mg total) into the muscle every 28 (twenty-eight) days. (Patient not taking: Reported on 11/21/2018) 1 each 1 Not Taking at Unknown time  . buPROPion (WELLBUTRIN XL) 300 MG 24 hr tablet Take 300 mg by mouth daily.   11/20/2018 at 1000  . divalproex (DEPAKOTE) 500 MG DR tablet Take 1 tablet (500 mg total) by mouth every 12 (twelve) hours. 60 tablet 1 11/20/2018 at 1000  . latanoprost (XALATAN) 0.005 % ophthalmic solution Place 1 drop at bedtime into both eyes. (Patient not taking: Reported on 11/21/2018) 2.5 mL 12 Not Taking at Unknown time  . lithium carbonate (ESKALITH) 450 MG CR tablet Take 1 tablet (450 mg total) by mouth every 12 (twelve) hours. 60 tablet 1 11/20/2018 at 1000  . Norgestimate-Ethinyl Estradiol Triphasic (ORTHO TRI-CYCLEN, 28,) 0.18/0.215/0.25 MG-35 MCG tablet Take 1 tablet by mouth daily.   11/20/2018 at 1000  . pantoprazole (PROTONIX)  40 MG tablet Take 1 tablet (40 mg total) by mouth daily. (Patient not taking: Reported on 11/21/2018) 30 tablet 1 Not Taking at Unknown time  . traZODone (DESYREL) 100 MG tablet Take 2 tablets (200 mg total) by mouth at bedtime. (Patient not taking: Reported on 11/21/2018) 60 tablet 1 Not Taking at Unknown time  . venlafaxine XR (EFFEXOR-XR) 150 MG 24 hr capsule Take 1 capsule (150 mg total) by mouth daily with breakfast. (Patient not taking: Reported on 11/21/2018) 30 capsule 1 Not Taking at Unknown time    Patient Stressors: Legal issue Medication change or noncompliance Substance abuse  Patient Strengths: Ability  for insight Communication skills Motivation for treatment/growth  Treatment Modalities: Medication Management, Group therapy, Case management,  1 to 1 session with clinician, Psychoeducation, Recreational therapy.   Physician Treatment Plan for Primary Diagnosis: Bipolar I disorder, most recent episode depressed, severe without psychotic features ([place]) Long Term Goal(s): Improvement in symptoms so as ready for discharge Improvement in symptoms so as ready for discharge   Short Term Goals: Ability to identify changes in lifestyle to reduce recurrence of condition will improve Ability to verbalize feelings will improve Ability to disclose and discuss suicidal ideas Ability to demonstrate self-control will improve Ability to identify and develop effective coping behaviors will improve Ability to maintain clinical measurements within normal limits will improve Compliance with prescribed medications will improve Ability to identify triggers associated with substance abuse/mental health issues will improve Ability to identify changes in lifestyle to reduce recurrence of condition will improve Ability to demonstrate self-control will improve Ability to identify triggers associated with substance abuse/mental health issues will improve  Medication Management: Evaluate patient's response, side effects, and tolerance of medication regimen.  Therapeutic Interventions: 1 to 1 sessions, Unit Group sessions and Medication administration.  Evaluation of Outcomes: Progressing  Physician Treatment Plan for Secondary Diagnosis: Principal Problem:   Bipolar I disorder, most recent episode depressed, severe without psychotic features ([place]) Active Problems:   Tobacco use disorder   Opioid use disorder, severe, dependence ([place])   Cannabis use disorder, severe, dependence ([place])  Long Term Goal(s): Improvement in symptoms so as ready for discharge Improvement in symptoms so as ready for discharge    Short Term Goals: Ability to identify changes in lifestyle to reduce recurrence of condition will improve Ability to verbalize feelings will improve Ability to disclose and discuss suicidal ideas Ability to demonstrate self-control will improve Ability to identify and develop effective coping behaviors will improve Ability to maintain clinical measurements within normal limits will improve Compliance with prescribed medications will improve Ability to identify triggers associated with substance abuse/mental health issues will improve Ability to identify changes in lifestyle to reduce recurrence of condition will improve Ability to demonstrate self-control will improve Ability to identify triggers associated with substance abuse/mental health issues will improve     Medication Management: Evaluate patient's response, side effects, and tolerance of medication regimen.  Therapeutic Interventions: 1 to 1 sessions, Unit Group sessions and Medication administration.  Evaluation of Outcomes: Progressing   RN Treatment Plan for Primary Diagnosis: Bipolar I disorder, most recent episode depressed, severe without psychotic features ([place]) Long Term Goal(s): Knowledge of disease and therapeutic regimen to maintain health will improve  Short Term Goals: Ability to participate in decision making will improve, Ability to verbalize feelings will improve, Ability to identify and develop effective coping behaviors will improve and Compliance with prescribed medications will improve  Medication Management: RN will administer medications as ordered by provider, will  assess and evaluate patient's response and provide education to patient for prescribed medication. RN will report any adverse and/or side effects to prescribing provider.  Therapeutic Interventions: 1 on 1 counseling sessions, Psychoeducation, Medication administration, Evaluate responses to treatment, Monitor vital signs and CBGs as ordered,  Perform/monitor CIWA, COWS, AIMS and Fall Risk screenings as ordered, Perform wound care treatments as ordered.  Evaluation of Outcomes: Progressing   LCSW Treatment Plan for Primary Diagnosis: Bipolar I disorder, most recent episode depressed, severe without psychotic features ([place]) Long Term Goal(s): Safe transition to appropriate next level of care at discharge, Engage patient in therapeutic group addressing interpersonal concerns.  Short Term Goals: Engage patient in aftercare planning with referrals and resources  Therapeutic Interventions: Assess for all discharge needs, 1 to 1 time with Social worker, Explore available resources and support systems, Assess for adequacy in community support network, Educate family and significant other(s) on suicide prevention, Complete Psychosocial Assessment, Interpersonal group therapy.  Evaluation of Outcomes: Progressing   Progress in Treatment: Attending groups: Yes. Participating in groups: Yes. Taking medication as prescribed: Yes. Toleration medication: Yes. Family/Significant other contact made: Yes, individual(s) contacted:  Aida Puffer, boyfriend Patient understands diagnosis: Yes. Discussing patient identified problems/goals with staff: Yes. Medical problems stabilized or resolved: Yes. Denies suicidal/homicidal ideation: Yes. Issues/concerns per patient self-inventory: No. Other: NA  New problem(s) identified: No, Describe:  None reported  New Short Term/Long Term Goal(s): "Go to an ACT team or go to [employer]"  Patient Goals:  "Go to an ACT team or go to [employer] or Barriers: Pt will return home and follow up with [facility] Reason for Continuation of Hospitalization: Medication stabilization  Estimated Length of Stay: 5-7 days    Recreational Therapy: Patient Stressors: Relationship, Work  Patient Goal: Patient will identify 3 positive coping skills strategies to use for  anxiety post d/c within 5 recreation therapy group sessions  Attendees: Patient: 11/27/2018 11:39 AM  Physician: Herma Ard [name] 11/27/2018 11:39 AM  Nursing:  11/27/2018 11:39 AM  RN Care Manager: 11/27/2018 11:39 AM  Social Worker: Anise Salvo 11/27/2018 11:39 AM  Recreational Therapist:  11/27/2018 11:39 AM  Other:  11/27/2018 11:39 AM  Other:  11/27/2018 11:39 AM  Other: 11/27/2018 11:39 AM    Scribe for Treatment Team: Yvette Rack, LCSW 11/27/2018 11:39 AM

## 2018-11-27 NOTE — Progress Notes (Signed)
Recreation Therapy Notes  Date: 11/27/2018  Time: 9:30 am  Location: Craft Room  Behavioral response: Appropriate  Intervention Topic: Self- care  Discussion/Intervention:  Group content today was focused on Self-Care. The group defined self-care and some positive ways they care for themselves. Individuals expressed ways and reasons why they neglected any self-care in the past. Patients described ways to improve self-care in the future. The group explained what could happen if they did not do any self-care activities at all. The group participated in the intervention "self-care assessment" where they had a chance to discover some of their weaknesses and strengths in self- care. Patient came up with a self-care plan to improve themselves in the future.  Clinical Observations/Feedback:  Patient came to group and defined self-care as personal hygiene. She left group early due to unknown reasons and never returned.   Anavey Coombes LRT/CTRS           Bradden Tadros 11/27/2018 10:27 AM

## 2018-11-27 NOTE — Plan of Care (Signed)
Pt. Reports improved coping. Pt. Complaint with medications. Pt. Denies si/hi/avh, can contract for safety.    Problem: Coping: Goal: Coping ability will improve Outcome: Progressing   Problem: Health Behavior/Discharge Planning: Goal: Compliance with treatment plan for underlying cause of condition will improve Outcome: Progressing   Problem: Safety: Goal: Periods of time without injury will increase Outcome: Progressing
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
## 2018-11-27 NOTE — [facility] Group Notes (Signed)
Overcoming Obstacles  11/27/2018 1PM  Type of Therapy and Topic:  Group Therapy:  Overcoming Obstacles  Participation Level:  Minimal    Description of Group:    In this group patients will be encouraged to explore what they see as obstacles to their own wellness and recovery. They will be guided to discuss their thoughts, feelings, and behaviors related to these obstacles. The group will process together ways to cope with barriers, with attention given to specific choices patients can make. Each patient will be challenged to identify changes they are motivated to make in order to overcome their obstacles. This group will be process-oriented, with patients participating in exploration of their own experiences as well as giving and receiving support and challenge from other group members.   Therapeutic Goals: 1. Patient will identify personal and current obstacles as they relate to admission. 2. Patient will identify barriers that currently interfere with their wellness or overcoming obstacles.  3. Patient will identify feelings, thought process and behaviors related to these barriers. 4. Patient will identify two changes they are willing to make to overcome these obstacles:      Summary of Patient Progress  Pt reserved during today's session, minimal interaction with members. Pt sat quietly but did not contribute during today's group.   Therapeutic Modalities:   Cognitive Behavioral Therapy Solution Focused Therapy Motivational Interviewing Relapse Prevention Therapy    Sanjuana Kava, MSW, LCSW 11/27/2018 2:16 PM

## 2018-11-28 NOTE — Progress Notes (Signed)
D- Patient alert and oriented. Patient presents in a pleasant mood on assessment stating that she slept "pretty good" last night and had no major complaints to voice to this Probation officer. Patient denies depression, however, she rated her anxiety a "4/10" stating that "just getting out of here and what I'm going to do" is making her anxious. Patient denies SI, HI, AVH, and pain at this time. Patient's goal for today is to "try to stay awake".  A- Scheduled medications administered to patient, per MD orders. Support and encouragement provided.  Routine safety checks conducted every 15 minutes.  Patient informed to notify staff with problems or concerns.  R- No adverse drug reactions noted. Patient contracts for safety at this time. Patient compliant with medications and treatment plan. Patient receptive, calm, and cooperative. Patient interacts well with others on the unit.  Patient remains safe at this time.

## 2018-11-28 NOTE — Progress Notes (Signed)
[facility] MD Progress Note  11/28/2018 9:40 AM Autumn [name]  MRN:  [id number]  Subjective:    Autumn [name] reports improvement but looks pretty depressed. Stays in her room mostly. She tries to participate in programing but her contribution is small. In many ways, she was better last week. Tolerates medications well. No somatic complaints.  Principal Problem: Bipolar I disorder, most recent episode depressed, severe without psychotic features ([place]) Diagnosis: Principal Problem:   Bipolar I disorder, most recent episode depressed, severe without psychotic features ([place]) Active Problems:   Tobacco use disorder   Opioid use disorder, severe, dependence ([place])   Cannabis use disorder, severe, dependence ([place])  Total Time spent with patient: 20 minutes  Past Psychiatric History: bipolar disorder  Past Medical History:  Past Medical History:  Diagnosis Date  . Allergy    Seasonal, Ultram (seizures)  . Anemia 2005  . Bipolar 1 disorder ([place])   . Glaucoma   . Seizures ([place]) 2008    Past Surgical History:  Procedure Laterality Date  . BREAST BIOPSY Right    x 2  . CESAREAN SECTION     x 2   Family History:  Family History  Problem Relation Age of Onset  . Hypertension Mother   . Hyperlipidemia Mother   . Parkinson's disease Father    Family Psychiatric  History: none Social History:  Social History   Substance and Sexual Activity  Alcohol Use Yes  . Alcohol/week: 4.0 standard drinks  . Types: 4 Cans of beer per week   Comment: Ocassional     Social History   Substance and Sexual Activity  Drug Use Yes  . Types: Marijuana   Comment: percocet -last use over 1 week ago    Social History   Socioeconomic History  . Marital status: Single    Spouse name: Not on file  . Number of children: Not on file  . Years of education: Not on file  . Highest education level: Not on file  Occupational History  . Not on file  Social Needs  . Financial resource strain: Not on  file  . Food insecurity:    Worry: Not on file    Inability: Not on file  . Transportation needs:    Medical: Not on file    Non-medical: Not on file  Tobacco Use  . Smoking status: Current Every Day Smoker    Packs/day: 1.00    Years: 9.00    Pack years: 9.00  . Smokeless tobacco: Never Used  . Tobacco comment: Patient not interested  Substance and Sexual Activity  . Alcohol use: Yes    Alcohol/week: 4.0 standard drinks    Types: 4 Cans of beer per week    Comment: Ocassional  . Drug use: Yes    Types: Marijuana    Comment: percocet -last use over 1 week ago  . Sexual activity: Yes    Birth control/protection: None, Pill  Lifestyle  . Physical activity:    Days per week: Not on file    Minutes per session: Not on file  . Stress: Not on file  Relationships  . Social connections:    Talks on phone: Not on file    Gets together: Not on file    Attends religious service: Not on file    Active member of club or organization: Not on file    Attends meetings of clubs or organizations: Not on file    Relationship status: Not on file  Other  Topics Concern  . Not on file  Social History Narrative  . Not on file   Additional Social History:    Pain Medications: see PTA Prescriptions: see PTA Over the Counter: see PTA History of alcohol / drug use?: Yes Longest period of sobriety (when/how long): Unknown Negative Consequences of Use: Financial, Legal, Personal relationships Name of Substance 1: Heroin   Name of Substance 3: Adderall              Sleep: Fair  Appetite:  Fair  Current Medications: Current Facility-Administered Medications  Medication Dose Route Frequency Provider Last Rate Last Dose  . acetaminophen (TYLENOL) tablet 650 mg  650 mg Oral Q6H PRN [name], MD      . alum & mag hydroxide-simeth (MAALOX/MYLANTA) 200-200-20 MG/5ML suspension 30 mL  30 mL Oral Q4H PRN [name], MD      . buprenorphine-naloxone (SUBOXONE) 8-2 mg per SL  tablet 1 tablet  1 tablet Sublingual Daily Brityn Mastrogiovanni B, MD   1 tablet at 11/28/18 0916  . buPROPion (WELLBUTRIN XL) 24 hr tablet 150 mg  150 mg Oral Daily Aisia Correira B, MD   150 mg at 11/28/18 0916  . diphenhydrAMINE (BENADRYL) capsule 50 mg  50 mg Oral QHS Donata Reddick B, MD   50 mg at 11/27/18 2246  . divalproex (DEPAKOTE ER) 24 hr tablet 1,000 mg  1,000 mg Oral QHS He, [name], MD   1,000 mg at 11/27/18 2245  . haloperidol (HALDOL) tablet 5 mg  5 mg Oral QHS Meklit Cotta B, MD   5 mg at 11/27/18 2246  . hydrOXYzine (ATARAX/VISTARIL) tablet 25 mg  25 mg Oral Q4H PRN [name], MD   25 mg at 11/21/18 2207  . lithium carbonate (ESKALITH) CR tablet 450 mg  450 mg Oral Q12H [name], MD   450 mg at 11/28/18 0916  . magnesium hydroxide (MILK OF MAGNESIA) suspension 30 mL  30 mL Oral Daily PRN [name], MD      . Norgestimate-Ethinyl Estradiol Triphasic 0.18/0.215/0.25 MG-35 MCG tablet 1 tablet  1 tablet Oral Daily Skylene Deremer B, MD      . traZODone (DESYREL) tablet 100 mg  100 mg Oral QHS PRN,MR X 1 [name], MD   100 mg at 11/21/18 2207    Lab Results: No results found for this or any previous visit (from the past 48 hour(s)).  Blood Alcohol level:  Lab Results  Component Value Date   ETH <10 11/21/2018   ETH <10 85/27/7824    Metabolic Disorder Labs: Lab Results  Component Value Date   HGBA1C 5.0 11/21/2018   MPG 96.8 11/21/2018   MPG 93.93 04/13/2018   No results found for: PROLACTIN Lab Results  Component Value Date   CHOL 196 11/21/2018   TRIG 97 11/21/2018   HDL 53 11/21/2018   CHOLHDL 3.7 11/21/2018   VLDL 19 11/21/2018   LDLCALC 124 (H) 11/21/2018   LDLCALC 117 (H) 04/13/2018    Physical Findings: AIMS: Facial and Oral Movements Muscles of Facial Expression: None, normal Lips and Perioral Area: None, normal Jaw: None, normal Tongue: None, normal,Extremity Movements Upper (arms, wrists, hands,  fingers): None, normal Lower (legs, knees, ankles, toes): None, normal, Trunk Movements Neck, shoulders, hips: None, normal, Overall Severity Severity of abnormal movements (highest score from questions above): None, normal Incapacitation due to abnormal movements: None, normal Patient's awareness of abnormal movements (rate only patient's report): No Awareness, Dental  Status Current problems with teeth and/or dentures?: No Does patient usually wear dentures?: No  CIWA:  CIWA-Ar Total: 0 COWS:  COWS Total Score: 0  Musculoskeletal: Strength & Muscle Tone: within normal limits Gait & Station: normal Patient leans: N/A  Psychiatric Specialty Exam: Physical Exam  Nursing note and vitals reviewed. Psychiatric: Her speech is normal. Her affect is blunt. She is slowed and withdrawn. Thought content is paranoid. Cognition and memory are normal. She expresses impulsivity.    Review of Systems  Neurological: Negative.   Psychiatric/Behavioral: Positive for depression and substance abuse.  All other systems reviewed and are negative.   Blood pressure 94/61, pulse (!) 59, temperature 98.1 F (36.7 C), temperature source Oral, resp. rate 16, height 5\' 2"  (1.575 m), weight 52.2 kg, last menstrual period 10/21/2018, SpO2 99 %.Body mass index is 21.03 kg/m.  General Appearance: Casual  Eye Contact:  Fair  Speech:  Clear and Coherent  Volume:  Normal  Mood:  Depressed and Hopeless  Affect:  Blunt  Thought Process:  Goal Directed and Descriptions of Associations: Intact  Orientation:  Full (Time, Place, and Person)  Thought Content:  WDL  Suicidal Thoughts:  No  Homicidal Thoughts:  No  Memory:  Immediate;   Fair Recent;   Fair Remote;   Fair  Judgement:  Poor  Insight:  Lacking  Psychomotor Activity:  Psychomotor Retardation  Concentration:  Concentration: Fair and Attention Span: Fair  Recall:  [employer] of Knowledge:  Fair  Language:  Fair  Akathisia:  No  Handed:  Right  AIMS  (if indicated):     Assets:  Communication Skills Desire for Improvement Housing Intimacy Physical Health Resilience Social Support  ADL's:  Intact  Cognition:  WNL  Sleep:  Number of Hours: 8.25     Treatment Plan Summary: Daily contact with patient to assess and evaluate symptoms and progress in treatment and Medication management   Autumn [name] is a 43 year old female with a history of bipolar disorder, opioid use disorder admitted for suicidal ideation in the context of severe social stressors, substance abuse and medication noncompliance.  #Suicidal ideation, resolved -patient able to contract for safety in the hospital  #Mood, improving slightly -continue Lithium 450 mg BID, low Li level on admission 0.25, now 0.8 -continue Depakote ER 100 mg nightly, recheck VPA level on Fri -Wellbutrin 150 mg daily -Haldol 5 mg nightly with Benadryl 50 mg nightly -Trazodone 100 mg nightly PRN  #Opiod dependence - Suboxone taperwas never initiated.  - at this time, pt wants to be maintained on this medicine, at suboxone 8-2mg  daily. She is a good candidate for suboxone maintenance treatment as long as she has an outpatient followup either at a suboxone clinic or a methadone clinic (OTP), which dispenses suboxone.   #Substance abuse treatment -declines residential treatment, encouraged SAIOP if possible.   #Smoking cessation -nicotine patch is available  $Labs -lipid panel, [facility] normal -EKGreviewed, NSR with QTc 400 -pregnancy test negative  #Disposition -discharge to home with boyfriend -follow up TBD she no longer wants to go to [facility]. Pt said that she wants to go to [place] for [employer] treatment, or another suboxone clinic or OTP (opioid treatment program). -[facility] team would be the best option. [facility] or [facility] do not have IPRS spots.  Orson Slick, MD 11/28/2018, 9:40 AM

## 2018-11-28 NOTE — Plan of Care (Signed)
Patient is isolative to her room and has minimal interactions with peers and staff. Patient is pleasant but just stays in her bed the entire shift other than to get up for medications.   Problem: Education: Goal: Mental status will improve Outcome: Not Progressing   Problem: Health Behavior/Discharge Planning: Goal: Compliance with treatment plan for underlying cause of condition will improve Outcome: Not Progressing

## 2018-11-28 NOTE — Plan of Care (Signed)
Patient has verbalized understanding of the general information as well as her prescribed therapeutic regimen that's been provided to her and all questions/concerns have been addressed and answered at this time. Patient continues to isolate to her room after meals and sleep on and off throughout the day. Patient denies depression as well as SI/HI/AVH, however, she rated her anxiety a "4/10" stating the "same things" are making her anxious, which was "just getting out of here and what I'm going to do". Patient has been in compliance with her treatment plan and has been free from injury thus far. Patient remains safe on the unit at this time.  Problem: Education: Goal: Utilization of techniques to improve thought processes will improve Outcome: Progressing Goal: Knowledge of the prescribed therapeutic regimen will improve Outcome: Progressing   Problem: Coping: Goal: Coping ability will improve Outcome: Progressing Goal: Will verbalize feelings Outcome: Progressing   Problem: Education: Goal: Knowledge of [place] [facility] Education information/materials will improve Outcome: Progressing Goal: Emotional status will improve Outcome: Progressing Goal: Mental status will improve Outcome: Progressing Goal: Verbalization of understanding the information provided will improve Outcome: Progressing   Problem: Health Behavior/Discharge Planning: Goal: Compliance with treatment plan for underlying cause of condition will improve Outcome: Progressing   Problem: Safety: Goal: Periods of time without injury will increase Outcome: Progressing

## 2018-11-28 NOTE — [facility] Group Notes (Signed)
[facility] Group Notes:  (Nursing/MHT/Case Management/Adjunct)  Date:  11/28/2018  Time:  10:13 PM  Type of Therapy:  Group Therapy  Participation Level:  Did Not Attend    Barnie Mort 11/28/2018, 10:13 PM

## 2018-11-28 NOTE — [facility] Group Notes (Signed)
LCSW Group Therapy Note  11/28/2018 1:00 PM  Type of Therapy/Topic:  Group Therapy:  Feelings about Diagnosis  Participation Level:  Active  Description of Group:   This group will allow patients to explore their thoughts and feelings about diagnoses they have received. Patients will be guided to explore their level of understanding and acceptance of these diagnoses. Facilitator will encourage patients to process their thoughts and feelings about the reactions of others to their diagnosis and will guide patients in identifying ways to discuss their diagnosis with significant others in their lives. This group will be process-oriented, with patients participating in exploration of their own experiences, giving and receiving support, and processing challenge from other group members.   Therapeutic Goals: 1. Patient will demonstrate understanding of diagnosis as evidenced by identifying two or more symptoms of the disorder 2. Patient will be able to express two feelings regarding the diagnosis 3. Patient will demonstrate their ability to communicate their needs through discussion and/or role play  Summary of Patient Progress: Patient was present for group and an active participant. Patient shared with the group that she has struggled with addiction concerns.  Patient reports that she has struggled with her sadness and depression as well as guilt and shame surrounding her use.  Patient dud not share input on how her diagnosis had impacted her family.  Patient received support from other group members and was supportive to others.    Therapeutic Modalities:   Cognitive Behavioral Therapy Brief Therapy Feelings Identification   Assunta Curtis, MSW, LCSW 11/28/2018 2:36 PM

## 2018-11-28 NOTE — Progress Notes (Signed)
D - Patient was in her room upon arrival to the unit. Patient was pleasant during assessment and medication administration. Patient was isolative to her room this evening. Patient denies SI/HI/AVH and pain. Patient stated, "I had a good day and I slept good last night. Patient says her anxiety and depression have gotten better since she came in.    A - Patient was compliant with medication administration per MD orders and procedures on the unit. Patient given education. Patient given support and encouragement. Patient informed to let staff know if there are any issues or problems on the unit.   R - Patient being monitored Q 15 minutes for safety per unit protocol. Patient remains safe on the unit at this time.

## 2018-11-28 NOTE — Progress Notes (Signed)
Recreation Therapy Notes   Date: 11/28/2018  Time: 9:30 am  Location: Craft Room  Behavioral response: Appropriate  Intervention Topic: Communication  Discussion/Intervention:  Group content today was focused on communication. The group defined communication and ways to communicate with others. Individuals stated reason why communication is important and some reasons to communicate with others. Patients expressed if they thought they were good at communicating with others and ways they could improve their communication skills. The group identified important parts of communication and some experiences they have had in the past with communication. The group participated in the intervention "What is that?", where they had a chance to test out their communication skills and identify ways to improve their communication techniques.  Clinical Observations/Feedback:  Patient came to group but and stated that communication is the exchange if information. She left group due to unknown reasons and never returned.  Orchid Glassberg LRT/CTRS         Maxum Cassarino 11/28/2018 11:03 AM

## 2018-11-29 NOTE — Progress Notes (Signed)
Patient alert and oriented x 4 denies SI/HI/AVH, affect is flat but brightens upon approach, mood is pleasant and she is receptive to staff. Patient's thoughts are organized and coherent, she is interacting appropriately with peers and staff. 15 minutes safety checks maintained will continue to monitor.

## 2018-11-29 NOTE — Progress Notes (Signed)
D- Patient alert and oriented. Patient presents in a pleasant mood on assessment stating that she slept pretty good last night and had no major complaints to voice to this Probation officer. Patient denies any signs/symptoms of depression and anxiety. Patient also denies SI, HI, AVH, and pain at this time. Patient's goal for today is to "journal more".  A- Scheduled medications administered to patient, per MD orders. Support and encouragement provided.  Routine safety checks conducted every 15 minutes.  Patient informed to notify staff with problems or concerns.  R- No adverse drug reactions noted. Patient contracts for safety at this time. Patient compliant with medications and treatment plan. Patient receptive, calm, and cooperative. Patient interacts well with others on the unit.  Patient remains safe at this time.

## 2018-11-29 NOTE — Plan of Care (Signed)
Patient has verbalized understanding of the general information as well as her prescribed therapeutic regimen that's been provided to her and all questions/concerns have been addressed and answered at this time. Patient continues to isolate to her room except for meals . Patient denies SI/HI/AVH as well as any signs/symptoms of depression and anxiety. Patient has been in compliance with her treatment plan and has been free from injury thus far. Patient remains safe on the unit at this time.  Problem: Education: Goal: Utilization of techniques to improve thought processes will improve Outcome: Progressing Goal: Knowledge of the prescribed therapeutic regimen will improve Outcome: Progressing   Problem: Coping: Goal: Coping ability will improve Outcome: Progressing Goal: Will verbalize feelings Outcome: Progressing   Problem: Education: Goal: Knowledge of [facility] Education information/materials will improve Outcome: Progressing Goal: Emotional status will improve Outcome: Progressing Goal: Mental status will improve Outcome: Progressing Goal: Verbalization of understanding the information provided will improve Outcome: Progressing   Problem: Health Behavior/Discharge Planning: Goal: Compliance with treatment plan for underlying cause of condition will improve Outcome: Progressing   Problem: Safety: Goal: Periods of time without injury will increase Outcome: Progressing

## 2018-11-29 NOTE — Plan of Care (Signed)
  Problem: Education: Goal: Utilization of techniques to improve thought processes will improve Outcome: Progressing  Thought process is organized and coherent

## 2018-11-29 NOTE — Progress Notes (Signed)
[facility] MD Progress Note  11/29/2018 4:07 PM Shawndra Clute  MRN:  [id number]  Subjective:    Ms. Cathy reports improvement. Denies depression, anxiety or psychosis. Denies suicidal or homicidal thoughts but objectively seems depressed. She hardly gets out of bed, sleeps all day long and attents classes with minimal participation. Tolerates medications well, no somatic complaints. We plan on discharging Friday.  Principal Problem: Bipolar I disorder, most recent episode depressed, severe without psychotic features ([place]) Diagnosis: Principal Problem:   Bipolar I disorder, most recent episode depressed, severe without psychotic features ([place]) Active Problems:   Tobacco use disorder   Opioid use disorder, severe, dependence ([place])   Cannabis use disorder, severe, dependence ([place])  Total Time spent with patient: 20 minutes  Past Psychiatric History: bipolar disorder  Past Medical History:  Past Medical History:  Diagnosis Date  . Allergy    Seasonal, Ultram (seizures)  . Anemia 2005  . Bipolar 1 disorder ([place])   . Glaucoma   . Seizures ([place]) 2008    Past Surgical History:  Procedure Laterality Date  . BREAST BIOPSY Right    x 2  . CESAREAN SECTION     x 2   Family History:  Family History  Problem Relation Age of Onset  . Hypertension Mother   . Hyperlipidemia Mother   . Parkinson's disease Father    Family Psychiatric  History: none Social History:  Social History   Substance and Sexual Activity  Alcohol Use Yes  . Alcohol/week: 4.0 standard drinks  . Types: 4 Cans of beer per week   Comment: Ocassional     Social History   Substance and Sexual Activity  Drug Use Yes  . Types: Marijuana   Comment: percocet -last use over 1 week ago    Social History   Socioeconomic History  . Marital status: Single    Spouse name: Not on file  . Number of children: Not on file  . Years of education: Not on file  . Highest education level: Not on file  Occupational  History  . Not on file  Social Needs  . Financial resource strain: Not on file  . Food insecurity:    Worry: Not on file    Inability: Not on file  . Transportation needs:    Medical: Not on file    Non-medical: Not on file  Tobacco Use  . Smoking status: Current Every Day Smoker    Packs/day: 1.00    Years: 9.00    Pack years: 9.00  . Smokeless tobacco: Never Used  . Tobacco comment: Patient not interested  Substance and Sexual Activity  . Alcohol use: Yes    Alcohol/week: 4.0 standard drinks    Types: 4 Cans of beer per week    Comment: Ocassional  . Drug use: Yes    Types: Marijuana    Comment: percocet -last use over 1 week ago  . Sexual activity: Yes    Birth control/protection: None, Pill  Lifestyle  . Physical activity:    Days per week: Not on file    Minutes per session: Not on file  . Stress: Not on file  Relationships  . Social connections:    Talks on phone: Not on file    Gets together: Not on file    Attends religious service: Not on file    Active member of club or organization: Not on file    Attends meetings of clubs or organizations: Not on file  Relationship status: Not on file  Other Topics Concern  . Not on file  Social History Narrative  . Not on file   Additional Social History:    Pain Medications: see PTA Prescriptions: see PTA Over the Counter: see PTA History of alcohol / drug use?: Yes Longest period of sobriety (when/how long): Unknown Negative Consequences of Use: Financial, Legal, Personal relationships Name of Substance 1: Heroin   Name of Substance 3: Adderall              Sleep: Fair  Appetite:  Fair  Current Medications: Current Facility-Administered Medications  Medication Dose Route Frequency Provider Last Rate Last Dose  . acetaminophen (TYLENOL) tablet 650 mg  650 mg Oral Q6H PRN [name], MD      . alum & mag hydroxide-simeth (MAALOX/MYLANTA) 200-200-20 MG/5ML suspension 30 mL  30 mL Oral Q4H PRN  [name], MD      . buprenorphine-naloxone (SUBOXONE) 8-2 mg per SL tablet 1 tablet  1 tablet Sublingual Daily Pasqual Farias B, MD   1 tablet at 11/29/18 0848  . buPROPion (WELLBUTRIN XL) 24 hr tablet 150 mg  150 mg Oral Daily Bobbyjo Marulanda B, MD   150 mg at 11/29/18 0848  . diphenhydrAMINE (BENADRYL) capsule 50 mg  50 mg Oral QHS Ishanvi Mcquitty B, MD   50 mg at 11/28/18 2227  . divalproex (DEPAKOTE ER) 24 hr tablet 1,000 mg  1,000 mg Oral QHS He, [name], MD   1,000 mg at 11/28/18 2227  . haloperidol (HALDOL) tablet 5 mg  5 mg Oral QHS Thom Ollinger B, MD   5 mg at 11/28/18 2227  . hydrOXYzine (ATARAX/VISTARIL) tablet 25 mg  25 mg Oral Q4H PRN [name], MD   25 mg at 11/21/18 2207  . lithium carbonate (ESKALITH) CR tablet 450 mg  450 mg Oral Q12H [name], MD   450 mg at 11/29/18 0848  . magnesium hydroxide (MILK OF MAGNESIA) suspension 30 mL  30 mL Oral Daily PRN [name], MD      . Norgestimate-Ethinyl Estradiol Triphasic 0.18/0.215/0.25 MG-35 MCG tablet 1 tablet  1 tablet Oral Daily Sakiya Stepka B, MD      . traZODone (DESYREL) tablet 100 mg  100 mg Oral QHS PRN,MR X 1 [name], MD   100 mg at 11/21/18 2207    Lab Results: No results found for this or any previous visit (from the past 48 hour(s)).  Blood Alcohol level:  Lab Results  Component Value Date   ETH <10 11/21/2018   ETH <10 30/16/0109    Metabolic Disorder Labs: Lab Results  Component Value Date   HGBA1C 5.0 11/21/2018   MPG 96.8 11/21/2018   MPG 93.93 04/13/2018   No results found for: PROLACTIN Lab Results  Component Value Date   CHOL 196 11/21/2018   TRIG 97 11/21/2018   HDL 53 11/21/2018   CHOLHDL 3.7 11/21/2018   VLDL 19 11/21/2018   LDLCALC 124 (H) 11/21/2018   LDLCALC 117 (H) 04/13/2018    Physical Findings: AIMS: Facial and Oral Movements Muscles of Facial Expression: None, normal Lips and Perioral Area: None, normal Jaw: None,  normal Tongue: None, normal,Extremity Movements Upper (arms, wrists, hands, fingers): None, normal Lower (legs, knees, ankles, toes): None, normal, Trunk Movements Neck, shoulders, hips: None, normal, Overall Severity Severity of abnormal movements (highest score from questions above): None, normal Incapacitation due to abnormal movements: None, normal Patient's awareness of abnormal movements (  rate only patient's report): No Awareness, Dental Status Current problems with teeth and/or dentures?: No Does patient usually wear dentures?: No  CIWA:  CIWA-Ar Total: 0 COWS:  COWS Total Score: 0  Musculoskeletal: Strength & Muscle Tone: within normal limits Gait & Station: normal Patient leans: N/A  Psychiatric Specialty Exam: Physical Exam  Nursing note and vitals reviewed. Psychiatric: Her speech is normal. Thought content normal. Her affect is blunt. She is slowed and withdrawn. Cognition and memory are normal. She expresses impulsivity. She exhibits a depressed mood.    Review of Systems  Neurological: Negative.   Psychiatric/Behavioral: Negative.   All other systems reviewed and are negative.   Blood pressure 102/62, pulse 62, temperature 97.7 F (36.5 C), temperature source Oral, resp. rate 16, height 5\' 2"  (1.575 m), weight 52.2 kg, last menstrual period 10/21/2018, SpO2 100 %.Body mass index is 21.03 kg/m.  General Appearance: Disheveled  Eye Contact:  Good  Speech:  Slow  Volume:  Decreased  Mood:  Depressed  Affect:  Blunt  Thought Process:  Goal Directed and Descriptions of Associations: Intact  Orientation:  Full (Time, Place, and Person)  Thought Content:  WDL  Suicidal Thoughts:  No  Homicidal Thoughts:  No  Memory:  Immediate;   Fair Recent;   Fair Remote;   Fair  Judgement:  Impaired  Insight:  Shallow  Psychomotor Activity:  Psychomotor Retardation  Concentration:  Concentration: Fair and Attention Span: Fair  Recall:  [employer] of Knowledge:  Fair   Language:  Fair  Akathisia:  No  Handed:  Right  AIMS (if indicated):     Assets:  Communication Skills Desire for Improvement Housing Intimacy Physical Health Resilience Social Support  ADL's:  Intact  Cognition:  WNL  Sleep:  Number of Hours: 8.15     Treatment Plan Summary: Daily contact with patient to assess and evaluate symptoms and progress in treatment and Medication management   Ms. Bassinger is a 43 year old female with a history of bipolar disorder, opioid use disorder admitted for suicidal ideation in the context of severe social stressors, substance abuse and medication noncompliance.  #Suicidal ideation, resolved -patient able to contract for safety in the hospital  #Mood, improving slightly -continue Lithium 450 mg BID, low Li level on admission 0.25, now0.8 -continue Depakote ER 100 mg nightly, recheck VPA levelon Fri -Wellbutrin 150 mg daily -Haldol 5 mg nightly with Benadryl 50 mg nightly -Trazodone 100 mg nightly PRN  #Opiod dependence - there was no need for detox   #Substance abuse treatment -declines residential treatment -open to [facility]  -interested in [facility] but no insurance  #Smoking cessation -nicotine patch is available  $Labs -lipid panel, [facility] normal -EKGreviewed, NSR with QTc 400 -pregnancy test negative  #Disposition -discharge to home with boyfriend -follow up with [facility] team would be the best option. [facility] or [employer] [facility] do not have IPRS spots.  Orson Slick, MD 11/29/2018, 4:07 PM

## 2018-11-29 NOTE — Plan of Care (Signed)
Cooperative with treatment on shift, she remains sad and depressed, she spent most of the evening in bed resting. Patient was medication compliant. She is in bed resting at this time.

## 2018-11-29 NOTE — BHH Group Notes (Signed)
[place] Group Notes:  (Nursing/MHT/Case Management/Adjunct)  Date:  11/29/2018  Time:  9:38 PM  Type of Therapy:  Group Therapy  Participation Level:  Active  Participation Quality:  Appropriate  Affect:  Appropriate  Cognitive:  Appropriate  Insight:  Appropriate  Engagement in Group:  Engaged  Modes of Intervention:  Education  Summary of Progress/Problems: MHT informed patients of rules and expectations of the unit. MHT reviewed visitation and call hours. MHT informed patients they needed to provide their code to any one they wanted to contact them. MHT reminded patients that staff would not confirm or deny if they were on the unit if the person did not have the code. MHT encouraged patients not to take food to their rooms. MHT encouraged patients to clean up after themselves. MHT informed patients of routine checks and not to be alarmed if they see or hear techs opening their doors. MHT processed with patients about shift change at 7a and 7p. MHT encouraged patients to get what they need before report started or after it was completed. MHT informed patients not to give out personal information and to only use first names while on the unit. MHT discussed resources that were available in the community. Barnie Mort 11/29/2018, 9:38 PM

## 2018-11-29 NOTE — Progress Notes (Signed)
Recreation Therapy Notes  Date: 11/29/2018  Time: 9:30 am   Location: Craft room   Behavioral response: N/A   Intervention Topic: Problem Solving  Discussion/Intervention: Patient did not attend group.   Clinical Observations/Feedback:  Patient did not attend group.   Lacee Grey LRT/CTRS         Ebonique Hallstrom 11/29/2018 10:28 AM

## 2018-11-29 NOTE — BHH Group Notes (Signed)
Emotional Regulation 11/29/2018 1PM  Type of Therapy/Topic:  Group Therapy:  Emotion Regulation  Participation Level:  Did Not Attend   Description of Group:   The purpose of this group is to assist patients in learning to regulate negative emotions and experience positive emotions. Patients will be guided to discuss ways in which they have been vulnerable to their negative emotions. These vulnerabilities will be juxtaposed with experiences of positive emotions or situations, and patients will be challenged to use positive emotions to combat negative ones. Special emphasis will be placed on coping with negative emotions in conflict situations, and patients will process healthy conflict resolution skills.  Therapeutic Goals: 1. Patient will identify two positive emotions or experiences to reflect on in order to balance out negative emotions 2. Patient will label two or more emotions that they find the most difficult to experience 3. Patient will demonstrate positive conflict resolution skills through discussion and/or role plays  Summary of Patient Progress:       Therapeutic Modalities:   Cognitive Behavioral Therapy Feelings Identification Dialectical Behavioral Therapy   Yvette Rack, LCSW 11/29/2018 1:57 PM

## 2018-11-30 MED ORDER — DIVALPROEX SODIUM ER 500 MG PO TB24: 1000.0000 mg | ORAL_TABLET | Freq: Every day | ORAL | 1 refills | Status: DC

## 2018-11-30 MED ORDER — LITHIUM CARBONATE ER 450 MG PO TBCR: 450.0000 mg | EXTENDED_RELEASE_TABLET | Freq: Two times a day (BID) | ORAL | 1 refills | Status: DC

## 2018-11-30 MED ORDER — BUPROPION HCL ER (XL) 150 MG PO TB24: 150.0000 mg | ORAL_TABLET | Freq: Every day | ORAL | 1 refills | Status: DC

## 2018-11-30 MED ORDER — DIPHENHYDRAMINE HCL 50 MG PO CAPS: 50.0000 mg | ORAL_CAPSULE | Freq: Every day | ORAL | 0 refills | Status: DC

## 2018-11-30 MED ORDER — HALOPERIDOL 5 MG PO TABS: 5.0000 mg | ORAL_TABLET | Freq: Every day | ORAL | 1 refills | Status: DC

## 2018-11-30 MED ORDER — TRAZODONE HCL 100 MG PO TABS: 200.0000 mg | ORAL_TABLET | Freq: Every day | ORAL | 1 refills | Status: DC

## 2018-11-30 NOTE — Plan of Care (Signed)
Patient alert and oriented x 4. Present in the milieu, ambulates with a steady gait. Isolative to her room majority of the day, comes out for meals and attends groups with active participation. Her goal for today is to journal her feelings and stay awake more. Denies SI/HI/AVH and pain at this time. Milieu remains safe with q 15 minute safety checks. Will continue to monitor patient.

## 2018-11-30 NOTE — BHH Suicide Risk Assessment (Signed)
[facility] Discharge Suicide Risk Assessment   Principal Problem: Bipolar I disorder, most recent episode depressed, severe without psychotic features [facility]) Discharge Diagnoses: Principal Problem:   Bipolar I disorder, most recent episode depressed, severe without psychotic features ([place]) Active Problems:   Tobacco use disorder   Opioid use disorder, severe, dependence ([place])   Cannabis use disorder, severe, dependence ([place])   Total Time spent with patient: 20 minutes  Musculoskeletal: Strength & Muscle Tone: within normal limits Gait & Station: normal Patient leans: N/A  Psychiatric Specialty Exam: Review of Systems  Neurological: Negative.   Psychiatric/Behavioral: Negative.   All other systems reviewed and are negative.   Blood pressure (!) 83/54, pulse 63, temperature 98.2 F (36.8 C), temperature source Oral, resp. rate 18, height 5\' 2"  (1.575 m), weight 52.2 kg, last menstrual period 10/21/2018, SpO2 97 %.Body mass index is 21.03 kg/m.  General Appearance: Casual  Eye Contact::  Good  Speech:  Clear and Coherent409  Volume:  Normal  Mood:  Anxious  Affect:  Flat  Thought Process:  Goal Directed and Descriptions of Associations: Intact  Orientation:  Full (Time, Place, and Person)  Thought Content:  WDL  Suicidal Thoughts:  No  Homicidal Thoughts:  No  Memory:  Immediate;   Fair Recent;   Fair Remote;   Fair  Judgement:  Poor  Insight:  Lacking  Psychomotor Activity:  Decreased  Concentration:  Fair  Recall:  [employer] of Knowledge:Fair  Language: Fair  Akathisia:  No  Handed:  Right  AIMS (if indicated):     Assets:  Communication Skills Desire for Improvement Housing Intimacy Physical Health Resilience Social Support  Sleep:  Number of Hours: 7.5  Cognition: WNL  ADL's:  Intact   Mental Status Per Nursing Assessment::   On Admission:  NA  Demographic Factors:  Caucasian and Unemployed  Loss Factors: Decrease in vocational status, Legal issues and  Financial problems/change in socioeconomic status  Historical Factors: Prior suicide attempts and Impulsivity  Risk Reduction Factors:   Sense of responsibility to family, Living with another person, especially a relative and Positive social support  Continued Clinical Symptoms:  Bipolar Disorder:   Depressive phase Alcohol/Substance Abuse/Dependencies  Cognitive Features That Contribute To Risk:  None    Suicide Risk:  Minimal: No identifiable suicidal ideation.  Patients presenting with no risk factors but with morbid ruminations; may be classified as minimal risk based on the severity of the depressive symptoms  Follow-up Information    [facility], [employer]. Go on 12/12/2018.   Why:  Please go to [facility] on Tuesday, December 12, 2018 at [place] a notarized letter verifying who financially supports you and photo ID. Thank you. Contact information: [place] [address] [phone]           Plan Of Care/Follow-up recommendations:  Activity:  as tyolerated Diet:  low sodium heart healthy Other:  keep follow up appointments  Orson Slick, MD 12/01/2018, 9:11 AM
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
## 2018-11-30 NOTE — Progress Notes (Signed)
[facility] MD Progress Note  11/30/2018 3:55 PM Erminia [name]  MRN:  [id number]  Subjective:   Ms. Pio reports further improvement. She participated in [place] though she felt "sleepy" from Trazodone. She is no longer suicidal or homicidal. No somatic complaints. Discharge tomorrow.   We discussed opioid abuse and prospects of getting Suboxone at [facility]. The patient aware that I could not prescribe it and that she has to go through their routine protocol.  Principal Problem: Bipolar I disorder, most recent episode depressed, severe without psychotic features ([place]) Diagnosis: Principal Problem:   Bipolar I disorder, most recent episode depressed, severe without psychotic features ([place]) Active Problems:   Tobacco use disorder   Opioid use disorder, severe, dependence ([place])   Cannabis use disorder, severe, dependence ([place])  Total Time spent with patient: 20 minutes  Past Psychiatric History: bipolar disorder  Past Medical History:  Past Medical History:  Diagnosis Date  . Allergy    Seasonal, Ultram (seizures)  . Anemia 2005  . Bipolar 1 disorder ([place])   . Glaucoma   . Seizures ([place]) 2008    Past Surgical History:  Procedure Laterality Date  . BREAST BIOPSY Right    x 2  . CESAREAN SECTION     x 2   Family History:  Family History  Problem Relation Age of Onset  . Hypertension Mother   . Hyperlipidemia Mother   . Parkinson's disease Father    Family Psychiatric  History: none Social History:  Social History   Substance and Sexual Activity  Alcohol Use Yes  . Alcohol/week: 4.0 standard drinks  . Types: 4 Cans of beer per week   Comment: Ocassional     Social History   Substance and Sexual Activity  Drug Use Yes  . Types: Marijuana   Comment: percocet -last use over 1 week ago    Social History   Socioeconomic History  . Marital status: Single    Spouse name: Not on file  . Number of children: Not on file  . Years of education: Not on  file  . Highest education level: Not on file  Occupational History  . Not on file  Social Needs  . Financial resource strain: Not on file  . Food insecurity:    Worry: Not on file    Inability: Not on file  . Transportation needs:    Medical: Not on file    Non-medical: Not on file  Tobacco Use  . Smoking status: Current Every Day Smoker    Packs/day: 1.00    Years: 9.00    Pack years: 9.00  . Smokeless tobacco: Never Used  . Tobacco comment: Patient not interested  Substance and Sexual Activity  . Alcohol use: Yes    Alcohol/week: 4.0 standard drinks    Types: 4 Cans of beer per week    Comment: Ocassional  . Drug use: Yes    Types: Marijuana    Comment: percocet -last use over 1 week ago  . Sexual activity: Yes    Birth control/protection: None, Pill  Lifestyle  . Physical activity:    Days per week: Not on file    Minutes per session: Not on file  . Stress: Not on file  Relationships  . Social connections:    Talks on phone: Not on file    Gets together: Not on file    Attends religious service: Not on file    Active member of club or organization: Not on file  Attends meetings of clubs or organizations: Not on file    Relationship status: Not on file  Other Topics Concern  . Not on file  Social History Narrative  . Not on file   Additional Social History:    Pain Medications: see PTA Prescriptions: see PTA Over the Counter: see PTA History of alcohol / drug use?: Yes Longest period of sobriety (when/how long): Unknown Negative Consequences of Use: Financial, Legal, Personal relationships Name of Substance 1: Heroin   Name of Substance 3: Adderall              Sleep: Fair  Appetite:  Fair  Current Medications: Current Facility-Administered Medications  Medication Dose Route Frequency Provider Last Rate Last Dose  . acetaminophen (TYLENOL) tablet 650 mg  650 mg Oral Q6H PRN [name], MD      . alum & mag hydroxide-simeth  (MAALOX/MYLANTA) 200-200-20 MG/5ML suspension 30 mL  30 mL Oral Q4H PRN [name], MD      . buPROPion (WELLBUTRIN XL) 24 hr tablet 150 mg  150 mg Oral Daily Rainen Vanrossum B, MD   150 mg at 11/30/18 5400  . diphenhydrAMINE (BENADRYL) capsule 50 mg  50 mg Oral QHS Dorothymae Maciver B, MD   50 mg at 11/29/18 2143  . divalproex (DEPAKOTE ER) 24 hr tablet 1,000 mg  1,000 mg Oral QHS He, [name], MD   1,000 mg at 11/29/18 2143  . haloperidol (HALDOL) tablet 5 mg  5 mg Oral QHS Evadna Donaghy B, MD   5 mg at 11/29/18 2144  . hydrOXYzine (ATARAX/VISTARIL) tablet 25 mg  25 mg Oral Q4H PRN [name], MD   25 mg at 11/29/18 2144  . lithium carbonate (ESKALITH) CR tablet 450 mg  450 mg Oral Q12H [name], MD   450 mg at 11/30/18 8676  . magnesium hydroxide (MILK OF MAGNESIA) suspension 30 mL  30 mL Oral Daily PRN [name], MD      . Norgestimate-Ethinyl Estradiol Triphasic 0.18/0.215/0.25 MG-35 MCG tablet 1 tablet  1 tablet Oral Daily Jacobie Stamey B, MD      . traZODone (DESYREL) tablet 100 mg  100 mg Oral QHS PRN,MR X 1 [name], MD   100 mg at 11/29/18 2144    Lab Results: No results found for this or any previous visit (from the past 48 hour(s)).  Blood Alcohol level:  Lab Results  Component Value Date   ETH <10 11/21/2018   ETH <10 19/50/9326    Metabolic Disorder Labs: Lab Results  Component Value Date   HGBA1C 5.0 11/21/2018   MPG 96.8 11/21/2018   MPG 93.93 04/13/2018   No results found for: PROLACTIN Lab Results  Component Value Date   CHOL 196 11/21/2018   TRIG 97 11/21/2018   HDL 53 11/21/2018   CHOLHDL 3.7 11/21/2018   VLDL 19 11/21/2018   LDLCALC 124 (H) 11/21/2018   LDLCALC 117 (H) 04/13/2018    Physical Findings: AIMS: Facial and Oral Movements Muscles of Facial Expression: None, normal Lips and Perioral Area: None, normal Jaw: None, normal Tongue: None, normal,Extremity Movements Upper (arms, wrists, hands,  fingers): None, normal Lower (legs, knees, ankles, toes): None, normal, Trunk Movements Neck, shoulders, hips: None, normal, Overall Severity Severity of abnormal movements (highest score from questions above): None, normal Incapacitation due to abnormal movements: None, normal Patient's awareness of abnormal movements (rate only patient's report): No Awareness, Dental Status Current problems with teeth and/or dentures?: No  Does patient usually wear dentures?: No  CIWA:  CIWA-Ar Total: 0 COWS:  COWS Total Score: 0  Musculoskeletal: Strength & Muscle Tone: within normal limits Gait & Station: normal Patient leans: N/A  Psychiatric Specialty Exam: Physical Exam  Nursing note and vitals reviewed. Psychiatric: Her speech is normal. Thought content normal. Her affect is blunt. She is withdrawn. Cognition and memory are normal. She expresses impulsivity.    Review of Systems  Neurological: Negative.   Psychiatric/Behavioral: Negative.   All other systems reviewed and are negative.   Blood pressure 93/60, pulse 60, temperature 97.9 F (36.6 C), temperature source Oral, resp. rate 18, height 5\' 2"  (1.575 m), weight 52.2 kg, last menstrual period 10/21/2018, SpO2 100 %.Body mass index is 21.03 kg/m.  General Appearance: Casual  Eye Contact:  Good  Speech:  Clear and Coherent  Volume:  Normal  Mood:  Anxious  Affect:  Flat  Thought Process:  Goal Directed and Descriptions of Associations: Intact  Orientation:  Full (Time, Place, and Person)  Thought Content:  WDL  Suicidal Thoughts:  No  Homicidal Thoughts:  No  Memory:  Immediate;   Fair Recent;   Fair Remote;   Fair  Judgement:  Poor  Insight:  Lacking  Psychomotor Activity:  Psychomotor Retardation  Concentration:  Concentration: Fair and Attention Span: Fair  Recall:  [employer] of Knowledge:  Fair  Language:  Fair  Akathisia:  No  Handed:  Right  AIMS (if indicated):     Assets:  Communication Skills Desire for  Improvement Housing Intimacy Physical Health Resilience Social Support  ADL's:  Intact  Cognition:  WNL  Sleep:  Number of Hours: 7     Treatment Plan Summary: Daily contact with patient to assess and evaluate symptoms and progress in treatment and Medication management   Ms. Caratachea is a 43 year old female with a history of bipolar disorder, opioid use disorder admitted for suicidal ideation in the context of severe social stressors, substance abuse and medication noncompliance.  #Suicidal ideation, resolved -patient able to contract for safety in the hospital  #Mood, improving slightly -continue Lithium 450 mg BID, low Li level on admission 0.25, now0.8 -continue Depakote ER 100 mg nightly, recheck VPA levelon Fri -Wellbutrin 150 mg daily -Haldol 5 mg nightly with Benadryl 50 mg nightly -Trazodone 100 mg nightly PRN  #Opiod dependence -she was offered brief Suboxone detox  #Substance abuse treatment -declines residential treatment -open to [facility]  -interested in [facility] but no insurance  #Smoking cessation -nicotine patch is available  $Labs -lipid panel, A1Care normal -EKGreviewed, NSR with QTc 400 -pregnancy test negative  #Disposition -discharge to home with boyfriend -follow up with [facility] -[facility] team would be the best option. [facility] or [employer] do not have IPRS spots.   Orson Slick, MD 11/30/2018, 3:55 PM

## 2018-11-30 NOTE — Progress Notes (Signed)
Recreation Therapy Notes   Date: 11/30/2018  Time: 9:30 am   Location: Craft room   Behavioral response: N/A   Intervention Topic: Time Management  Discussion/Intervention: Patient did not attend group.   Clinical Observations/Feedback:  Patient did not attend group.   Jancy Sprankle LRT/CTRS         Janelie Goltz 11/30/2018 11:39 AM

## 2018-11-30 NOTE — [facility] Group Notes (Signed)
LCSW Group Therapy Note  11/30/2018 1:00 PM  Type of Therapy/Topic:  Group Therapy:  Balance in Life  Participation Level:  Did Not Attend  Description of Group:    This group will address the concept of balance and how it feels and looks when one is unbalanced. Patients will be encouraged to process areas in their lives that are out of balance and identify reasons for remaining unbalanced. Facilitators will guide patients in utilizing problem-solving interventions to address and correct the stressor making their life unbalanced. Understanding and applying boundaries will be explored and addressed for obtaining and maintaining a balanced life. Patients will be encouraged to explore ways to assertively make their unbalanced needs known to significant others in their lives, using other group members and facilitator for support and feedback.  Therapeutic Goals: 1. Patient will identify two or more emotions or situations they have that consume much of in their lives. 2. Patient will identify signs/triggers that life has become out of balance:  3. Patient will identify two ways to set boundaries in order to achieve balance in their lives:  4. Patient will demonstrate ability to communicate their needs through discussion and/or role plays  Summary of Patient Progress:      Therapeutic Modalities:   Cognitive Behavioral Therapy Solution-Focused Therapy Assertiveness Training  Assunta Curtis MSW, LCSW 11/30/2018 3:23 PM

## 2018-12-01 NOTE — Plan of Care (Signed)
Pt  compliance with medication Pt is coping well and denies SI/SIB. Pt contracts for safety. No psychotic behavior noted.     Problem: Education: Goal: Utilization of techniques to improve thought processes will improve Outcome: Progressing Goal: Knowledge of the prescribed therapeutic regimen will improve Outcome: Progressing   Problem: Coping: Goal: Coping ability will improve Outcome: Progressing Goal: Will verbalize feelings Outcome: Progressing   Problem: Education: Goal: Knowledge of [place] [facility] Education information/materials will improve Outcome: Progressing Goal: Emotional status will improve Outcome: Progressing Goal: Mental status will improve Outcome: Progressing Goal: Verbalization of understanding the information provided will improve Outcome: Progressing   Problem: Health Behavior/Discharge Planning: Goal: Compliance with treatment plan for underlying cause of condition will improve Outcome: Progressing   Problem: Safety: Goal: Periods of time without injury will increase Outcome: Progressing

## 2018-12-01 NOTE — Progress Notes (Signed)
Recreation Therapy Notes  INPATIENT RECREATION TR PLAN  Patient Details Name: Autumn [name] MRN: [id number] DOB: 05/23/1976 Today's Date: 12/01/2018  Rec Therapy Plan Is patient appropriate for Therapeutic Recreation?: Yes Treatment times per week: at least 3 Estimated Length of Stay: 5-7 days TR Treatment/Interventions: Group participation (Comment)  Discharge Criteria Pt will be discharged from therapy if:: Discharged Treatment plan/goals/alternatives discussed and agreed upon by:: Patient/family  Discharge Summary Short term goals set: Patient will identify 3 positive coping skills strategies to use for anxiety post d/c within 5 recreation therapy group sessions Short term goals met: Complete Progress toward goals comments: Groups attended Which groups?: Communication, Coping skills, Other (Comment)(Self-care) Reason goals not met: N/A Therapeutic equipment acquired: N/A Reason patient discharged from therapy: Discharge from hospital Pt/family agrees with progress & goals achieved: Yes Date patient discharged from therapy: 12/01/18      12/01/2018, 12:49 PM  

## 2018-12-01 NOTE — Progress Notes (Signed)
  [facility] Adult Case Management Discharge Plan :  Will you be returning to the same living situation after discharge:  Yes,  pt lives with boyfriend At discharge, do you have transportation home?: Yes,  CSW will assist with transportation Do you have the ability to pay for your medications: Yes,  pt referred to agency who can assist with medication  Release of information consent forms completed and in the chart;  [name]  Patient to Follow up at: Follow-up Information    P[facility], [employer]. Go on 12/12/2018.   Why:  Please go to [facility] on Tuesday, December 12, 2018 at [place] a notarized letter verifying who financially supports you and photo ID. Thank you. Contact information: [place] [address] [phone]           Next level of care provider has access to [place] and Suicide Prevention discussed: Yes,  Aida Puffer boyfriend  Have you used any form of tobacco in the last 30 days? (Cigarettes, Smokeless Tobacco, Cigars, and/or Pipes): Yes  Has patient been referred to the Quitline?: Patient refused referral  Patient has been referred for addiction treatment: Pt. refused referral  Yvette Rack, LCSW 12/01/2018, 9:27 AM

## 2018-12-01 NOTE — Progress Notes (Signed)
Patient alert and oriented x 4. Ambulates unit with steady gait. Verbally denies SI/HI/AVH and pain. Patient discharged on above date and time. Verbalized understanding the discharge information provided to patient upon discharge. Patient departed unit with discharge paperwork, prescriptions, 7 day supply of medications and personal belongings. Picked up by her boyfriend to go home and plans to follow up with outpatient services. No distress noted.

## 2018-12-01 NOTE — Progress Notes (Signed)
Pt was pleasant, came out to take her medication without issues.  Denies SI/SIB and contracts for safety. Currently resting comfortably in bed with respiration noted. Will continue with  q 15 minute safety checks.

## 2018-12-01 NOTE — Progress Notes (Signed)
Recreation Therapy Notes  Date:12/01/2018  Time:9:30 am  Location:[facility] room  Behavioral response:N/A  Intervention Topic:Team work  Discussion/Intervention: Patient did not attend group.  Clinical Observations/Feedback:  Patient did not attend group.  Jameer Storie LRT/CTRS          Anthem Frazer 12/01/2018 10:25 AM

## 2018-12-20 ENCOUNTER — Emergency Department
Admission: EM | Admit: 2018-12-20 | Discharge: 2018-12-21 | Disposition: A | Discharge status: Rehabilitation Facility | Payer: Self-pay | Attending: Emergency Medicine | Admitting: Emergency Medicine

## 2018-12-20 ENCOUNTER — Encounter: Payer: Self-pay | Admitting: *Deleted

## 2018-12-20 DIAGNOSIS — F1110 Opioid abuse, uncomplicated: Secondary | ICD-10-CM | POA: Insufficient documentation

## 2018-12-20 DIAGNOSIS — F1210 Cannabis abuse, uncomplicated: Secondary | ICD-10-CM | POA: Insufficient documentation

## 2018-12-20 DIAGNOSIS — F17200 Nicotine dependence, unspecified, uncomplicated: Secondary | ICD-10-CM | POA: Insufficient documentation

## 2018-12-20 DIAGNOSIS — F1510 Other stimulant abuse, uncomplicated: Secondary | ICD-10-CM | POA: Insufficient documentation

## 2018-12-20 LAB — COMPREHENSIVE METABOLIC PANEL
ALT: 7 U/L (ref 0–44)
AST: 7 U/L — ABNORMAL LOW (ref 15–41)
Albumin: 4 g/dL (ref 3.5–5.0)
Alkaline Phosphatase: 42 U/L (ref 38–126)
Anion gap: 5 (ref 5–15)
BUN: <5 mg/dL — ABNORMAL LOW (ref 6–20)
CO2: 26 mmol/L (ref 22–32)
Calcium: 9 mg/dL (ref 8.9–10.3)
Chloride: 108 mmol/L (ref 98–111)
Creatinine, Ser: 0.64 mg/dL (ref 0.44–1.00)
GFR calc Af Amer: >60 mL/min (ref >60)
GFR calc non Af Amer: >60 mL/min (ref >60)
GLUCOSE: 107 mg/dL — AB (ref 70–99)
Potassium: 3.6 mmol/L (ref 3.5–5.1)
SODIUM: 139 mmol/L (ref 135–145)
Total Bilirubin: 0.3 mg/dL (ref 0.3–1.2)
Total Protein: 6.8 g/dL (ref 6.5–8.1)

## 2018-12-20 LAB — CBC WITH DIFFERENTIAL/PLATELET
Abs Immature Granulocytes: 0.03 10*3/uL (ref 0.00–0.07)
BASOS PCT: 0 %
Basophils Absolute: 0 10*3/uL (ref 0.0–0.1)
Eosinophils Absolute: 0.5 10*3/uL (ref 0.0–0.5)
Eosinophils Relative: 5 %
HCT: 40.1 % (ref 36.0–46.0)
Hemoglobin: 13 g/dL (ref 12.0–15.0)
Immature Granulocytes: 0 %
Lymphocytes Relative: 29 %
Lymphs Abs: 2.7 10*3/uL (ref 0.7–4.0)
MCH: 30.7 pg (ref 26.0–34.0)
MCHC: 32.4 g/dL (ref 30.0–36.0)
MCV: 94.8 fL (ref 80.0–100.0)
MONOS PCT: 6 %
Monocytes Absolute: 0.5 10*3/uL (ref 0.1–1.0)
Neutro Abs: 5.5 10*3/uL (ref 1.7–7.7)
Neutrophils Relative %: 60 %
Platelets: 205 10*3/uL (ref 150–400)
RBC: 4.23 MIL/uL (ref 3.87–5.11)
RDW: 12.6 % (ref 11.5–15.5)
WBC: 9.2 10*3/uL (ref 4.0–10.5)
nRBC: 0 % (ref 0.0–0.2)

## 2018-12-20 LAB — URINE DRUG SCREEN, QUALITATIVE (ARMC ONLY)
Amphetamines, Ur Screen: NOT DETECTED
Barbiturates, Ur Screen: NOT DETECTED
Benzodiazepine, Ur Scrn: NOT DETECTED
Cannabinoid 50 Ng, Ur ~~LOC~~: POSITIVE — AB
Cocaine Metabolite,Ur ~~LOC~~: NOT DETECTED
MDMA (Ecstasy)Ur Screen: NOT DETECTED
Methadone Scn, Ur: NOT DETECTED
OPIATE, UR SCREEN: POSITIVE — AB
Phencyclidine (PCP) Ur S: NOT DETECTED
Tricyclic, Ur Screen: NOT DETECTED

## 2018-12-20 LAB — POCT PREGNANCY, URINE: Preg Test, Ur: NEGATIVE

## 2018-12-20 LAB — ETHANOL: Alcohol, Ethyl (B): <10 mg/dL (ref <10)

## 2018-12-20 MED ORDER — ACETAMINOPHEN 325 MG PO TABS
650.0000 mg | ORAL_TABLET | Freq: Once | ORAL | Status: AC
  Administered 2018-12-20: 650 mg via ORAL
  Filled 2018-12-20: qty 2

## 2018-12-20 MED ORDER — NICOTINE 14 MG/24HR TD PT24
14.0000 mg | MEDICATED_PATCH | Freq: Once | TRANSDERMAL | Status: DC
  Administered 2018-12-20: 14 mg via TRANSDERMAL
  Filled 2018-12-20: qty 1

## 2018-12-20 NOTE — ED Notes (Signed)
Report to include Situation, Background, Assessment, and Recommendations received from Amy RN. Patient alert and oriented, warm and dry, in no acute distress. Patient denies SI, HI, AVH and pain. Patient made aware of Q15 minute rounds and Rover and Officer presence for their safety. Patient instructed to come to me with needs or concerns.   

## 2018-12-20 NOTE — ED Notes (Signed)
Hourly rounding reveals patient in room. No complaints, stable, in no acute distress. Q15 minute rounds and monitoring via Rover and Officer to continue.   

## 2018-12-20 NOTE — ED Notes (Signed)
Pt given turkey sandwich tray and ginger ale. 

## 2018-12-20 NOTE — ED Notes (Signed)

## 2018-12-20 NOTE — BH Assessment (Signed)
TTS has assessed pt's notes and labs and is currently contacting [facility] for bed availability.

## 2018-12-20 NOTE — ED Provider Notes (Signed)
[facility] Emergency Department Provider Note  ____________________________________________  Time seen: Approximately 7:29 PM  I have reviewed the triage vital signs and the nursing notes.   HISTORY  Chief Complaint Drug Problem    HPI Autumn [name] is a 43 y.o. female with a history of bipolar disorder, polysubstance abuse, presenting for detox from her Adderall, heroin and marijuana abuse.  She reports that she has been using marijuana for 22 years and abusing Adderall and heroin for the past 3 years.  She has a daughter who is in custody of her paternal great grandparents, and she would like to regain custody of her.  She has no SI, HI or hallucinations.  She describes that she is having some "withdrawal" symptoms including "feeling like I am crawling out of my skin and body aches."  Her last use was yesterday morning.  Past Medical History:  Diagnosis Date  . Allergy    Seasonal, Ultram (seizures)  . Anemia 2005  . Bipolar 1 disorder ([place])   . Glaucoma   . Seizures ([place]) 2008    Patient Active Problem List   Diagnosis Date Noted  . Opiate abuse, continuous ([place]) 11/21/2018  . Bipolar I disorder, most recent episode depressed, severe without psychotic features ([place]) 07/05/2017  . Tobacco use disorder 06/27/2017  . 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 Opioid use disorder, severe, dependence ([place]) 06/27/2017  . Cannabis use disorder, severe, dependence ([place]) 06/27/2017  . Hx of glaucoma 10/02/2013    Past Surgical History:  Procedure Laterality Date  . BREAST BIOPSY Right    x 2  . CESAREAN SECTION     x 2    Current Outpatient Rx  . Order #: [id number] Class: Normal  . Order #: [id number] Class: Normal  . Order #: [id number] Class: Normal  . Order #: [id number] Class: Normal  . Order #: [id number] Class: Print  . Order #: [id number] Class: Historical Med  . Order #: [id number] Class: Print    Allergies Haldol [haloperidol  lactate] and Ultram [tramadol]  Family History  Problem Relation Age of Onset  . Hypertension Mother   . Hyperlipidemia Mother   . Parkinson's disease Father     Social History Social History   Tobacco Use  . Smoking status: Current Every Day Smoker    Packs/day: 1.00    Years: 9.00    Pack years: 9.00  . Smokeless tobacco: Never Used  . Tobacco comment: Patient not interested  Substance Use Topics  . Alcohol use: Yes    Alcohol/week: 4.0 standard drinks    Types: 4 Cans of beer per week    Comment: Ocassional  . Drug use: Yes    Types: Marijuana    Comment: percocet -last use over 1 week ago. Heroin-last used 2/11    Review of Systems Constitutional: No fever/chills.  Sensation of withdrawal from drugs. Eyes: No visual changes. ENT: No sore throat. No congestion or rhinorrhea. Cardiovascular: Denies chest pain. Denies palpitations. Respiratory: Denies shortness of breath.  No cough. Gastrointestinal: No abdominal pain.  No nausea, no vomiting.  No diarrhea.  No constipation. Genitourinary: Negative for dysuria. Musculoskeletal: Negative for back pain. Skin: Negative for rash. Neurological: Negative for headaches. No focal numbness, tingling or weakness.  Psychiatric:Polysubstance abuse.  Denies SI, HI or hallucinations.   ____________________________________________   PHYSICAL EXAM:  VITAL SIGNS: ED Triage Vitals [12/20/18 1814]  Enc Vitals Group     BP 119/79     Pulse Rate 94     Resp  16     Temp 97.8 F (36.6 C)     Temp Source Oral     SpO2 100 %     Weight 110 lb (49.9 kg)     Height      Head Circumference      Peak Flow      Pain Score 8     Pain Loc      Pain Edu?      Excl. in [place]?     Constitutional: Alert and oriented. Answers questions appropriately. Eyes: Conjunctivae are normal.  EOMI. No scleral icterus. Head: Atraumatic. Nose: No congestion/rhinnorhea. Mouth/Throat: Mucous membranes are moist.  Neck: No stridor.  Supple.  No  JVD.  No meningismus. Cardiovascular: Normal rate, regular rhythm. No murmurs, rubs or gallops.  No hypertension. Respiratory: Normal respiratory effort.  No accessory muscle use or retractions. Lungs CTAB.  No wheezes, rales or ronchi. Musculoskeletal: Moves all extremities well.  Normal gait without ataxia. Neurologic:  A&Ox3.  Speech is clear.  Face and smile are symmetric.  EOMI.  Moves all extremities well. Skin:  Skin is warm, dry and intact. No rash noted. Psychiatric: Mood and affect are normal. Speech and behavior are normal.  Normal judgement.  ____________________________________________   LABS (all labs ordered are listed, but only abnormal results are displayed)  Labs Reviewed  COMPREHENSIVE METABOLIC PANEL - Abnormal; Notable for the following components:      Result Value   Glucose, Bld 107 (*)    BUN <5 (*)    AST 7 (*)    All other components within normal limits  URINE DRUG SCREEN, QUALITATIVE ([facility] ONLY) - Abnormal; Notable for the following components:   Opiate, Ur Screen POSITIVE (*)    Cannabinoid 50 Ng, Ur [place] POSITIVE (*)    All other components within normal limits  ETHANOL  CBC WITH DIFFERENTIAL/PLATELET  POCT PREGNANCY, URINE  POC URINE PREG, ED   ____________________________________________  EKG  ED ECG REPORT I, Anne-Caroline Mariea Clonts, the attending physician, personally viewed and interpreted this ECG.   Date: 12/20/2018  EKG Time: 1832  Rate: 88  Rhythm: normal sinus rhythm  Axis: normal  Intervals:none  ST&T Change: No STEMI  ____________________________________________  RADIOLOGY  No results found.  ____________________________________________   PROCEDURES  Procedure(s) performed: None  Procedures  Critical Care performed: No ____________________________________________   INITIAL IMPRESSION / ASSESSMENT AND PLAN / ED COURSE  Pertinent labs & imaging results that were available during my care of the patient were reviewed by  me and considered in my medical decision making (see chart for details).  43 y.o. female with a history of bipolar disorder and polysubstance abuse presenting for detoxification from heroin, Adderall and marijuana abuse.  Overall, the patient reports that she is having withdrawal symptoms but is completely comfortable on exam, and has no vital sign abnormalities.  I will treat her with acetaminophen for her aches.  She is medically cleared for TTS evaluation.  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)  Marijuana abuse         NEW MEDICATIONS STARTED DURING THIS VISIT:  New Prescriptions   No medications on file      Eula Listen, MD 12/20/18 1932

## 2018-12-20 NOTE — ED Triage Notes (Signed)
Pt is here due to heroin withdrawal.  Pt used last yesterday.  Pt states that she decided to stop using as she realized she would not get to see her 42 year old daughter anymore if she does not stop using (paternal grandmother has custody).  Pt would like to go to a residential treatment center.  Pt denies any SI or HI.  Pt is currently homeless.  Brought in by her family member (father of her child who she is legally married to but is separated from).  Pt is calm and cooperative, no distress at this time.

## 2018-12-20 NOTE — ED Notes (Signed)
No distress.  Pt denies any thoughts for harming herself.

## 2018-12-21 NOTE — ED Notes (Signed)
Hourly rounding reveals patient in room. No complaints, stable, in no acute distress. Q15 minute rounds and monitoring via Rover and Officer to continue.   

## 2018-12-21 NOTE — BH Assessment (Signed)
Pt has been accepted [facility] with conditions. Per Lovey Newcomer (RN) they do not have any admissions slots available this evening however, the pt must call her tonight [phone]) [phone] to request treatment. Once treatment is requested by the pt the bed will be held for her and her admission appointment will be scheduled for 12/21/2018 at 1:30pm. With the pt's consent this writer will fax over her medical chart to [facility] for admissions processing.

## 2018-12-21 NOTE — ED Provider Notes (Signed)
-----------------------------------------   1:21 AM on 12/21/2018 -----------------------------------------  [facility] was accepted to [facility]; bed available at 1:30 PM.  Patient was given the choice to go to [facility] but she would have to be dressed out, or remain in the hallway bed.  Patient opts to remain in hallway bed.  Currently eating sandwich tray.   ----------------------------------------- 2:04 AM on 12/21/2018 -----------------------------------------  Bed at [facility] has opened up and they are coming now to transport the patient.   Paulette Blanch, MD 12/21/18 [id number]

## 2018-12-21 NOTE — Discharge Instructions (Addendum)
Proceed directly to [facility].  Return to the ER for worsening symptoms, persistent vomiting, difficulty breathing or other concerns.

## 2018-12-21 NOTE — ED Notes (Signed)
Pt discharged to [facility]. Patient was stable in NAD at the time of discharge. Discharge instruction was given and patient verbalized understanding. No issues.

## 2019-04-19 LAB — HM HIV SCREENING LAB: HM HIV Screening: NEGATIVE

## 2019-04-19 LAB — HM PAP SMEAR: HM Pap smear: NEGATIVE

## 2019-04-19 LAB — HM HEPATITIS C SCREENING LAB: HM Hepatitis Screen: NEGATIVE

## 2019-05-19 ENCOUNTER — Emergency Department: Payer: Self-pay

## 2019-05-19 ENCOUNTER — Other Ambulatory Visit: Payer: Self-pay

## 2019-05-19 ENCOUNTER — Encounter: Payer: Self-pay | Admitting: Emergency Medicine

## 2019-05-19 ENCOUNTER — Emergency Department
Admission: EM | Admit: 2019-05-19 | Discharge: 2019-05-21 | Disposition: A | Discharge status: Home / Self Care | Payer: Self-pay | Attending: Emergency Medicine | Admitting: Emergency Medicine

## 2019-05-19 DIAGNOSIS — F3110 Bipolar disorder, current episode manic without psychotic features, unspecified: Secondary | ICD-10-CM | POA: Diagnosis present

## 2019-05-19 DIAGNOSIS — Z79899 Other long term (current) drug therapy: Secondary | ICD-10-CM | POA: Insufficient documentation

## 2019-05-19 DIAGNOSIS — F309 Manic episode, unspecified: Secondary | ICD-10-CM

## 2019-05-19 DIAGNOSIS — R451 Restlessness and agitation: Secondary | ICD-10-CM | POA: Insufficient documentation

## 2019-05-19 DIAGNOSIS — F3181 Bipolar II disorder: Secondary | ICD-10-CM | POA: Insufficient documentation

## 2019-05-19 DIAGNOSIS — F3112 Bipolar disorder, current episode manic without psychotic features, moderate: Secondary | ICD-10-CM | POA: Insufficient documentation

## 2019-05-19 DIAGNOSIS — F17210 Nicotine dependence, cigarettes, uncomplicated: Secondary | ICD-10-CM | POA: Insufficient documentation

## 2019-05-19 LAB — URINE DRUG SCREEN, QUALITATIVE (ARMC ONLY)
Amphetamines, Ur Screen: NOT DETECTED
Barbiturates, Ur Screen: NOT DETECTED
Benzodiazepine, Ur Scrn: NOT DETECTED
Cannabinoid 50 Ng, Ur ~~LOC~~: NOT DETECTED
Cocaine Metabolite,Ur ~~LOC~~: NOT DETECTED
MDMA (Ecstasy)Ur Screen: NOT DETECTED
Methadone Scn, Ur: NOT DETECTED
Opiate, Ur Screen: NOT DETECTED
Phencyclidine (PCP) Ur S: NOT DETECTED
Tricyclic, Ur Screen: NOT DETECTED

## 2019-05-19 LAB — URINALYSIS, COMPLETE (UACMP) WITH MICROSCOPIC
Bacteria, UA: NONE SEEN
Bilirubin Urine: NEGATIVE
Glucose, UA: NEGATIVE mg/dL
Ketones, ur: 5 mg/dL — AB
Leukocytes,Ua: NEGATIVE
Nitrite: NEGATIVE
Protein, ur: 30 mg/dL — AB
Specific Gravity, Urine: 1.02 (ref 1.005–1.030)
pH: 5 (ref 5.0–8.0)

## 2019-05-19 LAB — CBC
HCT: 41.8 % (ref 36.0–46.0)
Hemoglobin: 14 g/dL (ref 12.0–15.0)
MCH: 30.6 pg (ref 26.0–34.0)
MCHC: 33.5 g/dL (ref 30.0–36.0)
MCV: 91.5 fL (ref 80.0–100.0)
Platelets: 334 10*3/uL (ref 150–400)
RBC: 4.57 MIL/uL (ref 3.87–5.11)
RDW: 12.6 % (ref 11.5–15.5)
WBC: 21.1 10*3/uL — ABNORMAL HIGH (ref 4.0–10.5)
nRBC: 0 % (ref 0.0–0.2)

## 2019-05-19 LAB — COMPREHENSIVE METABOLIC PANEL
ALT: 9 U/L (ref 0–44)
AST: 8 U/L — ABNORMAL LOW (ref 15–41)
Albumin: 4.8 g/dL (ref 3.5–5.0)
Alkaline Phosphatase: 53 U/L (ref 38–126)
Anion gap: 10 (ref 5–15)
BUN: 6 mg/dL (ref 6–20)
CO2: 24 mmol/L (ref 22–32)
Calcium: 9.3 mg/dL (ref 8.9–10.3)
Chloride: 104 mmol/L (ref 98–111)
Creatinine, Ser: 0.55 mg/dL (ref 0.44–1.00)
GFR calc Af Amer: >60 mL/min (ref >60)
GFR calc non Af Amer: >60 mL/min (ref >60)
Glucose, Bld: 130 mg/dL — ABNORMAL HIGH (ref 70–99)
Potassium: 3.7 mmol/L (ref 3.5–5.1)
Sodium: 138 mmol/L (ref 135–145)
Total Bilirubin: 0.7 mg/dL (ref 0.3–1.2)
Total Protein: 7.5 g/dL (ref 6.5–8.1)

## 2019-05-19 LAB — PREGNANCY, URINE: Preg Test, Ur: NEGATIVE

## 2019-05-19 LAB — ACETAMINOPHEN LEVEL: Acetaminophen (Tylenol), Serum: <10 ug/mL — ABNORMAL LOW (ref 10–30)

## 2019-05-19 LAB — ETHANOL: Alcohol, Ethyl (B): <10 mg/dL (ref <10)

## 2019-05-19 LAB — SALICYLATE LEVEL: Salicylate Lvl: <7 mg/dL (ref 2.8–30.0)

## 2019-05-19 MED ORDER — NORGESTIM-ETH ESTRAD TRIPHASIC 0.18/0.215/0.25 MG-35 MCG PO TABS: 1.0000 | ORAL_TABLET | Freq: Every day | ORAL | Status: DC

## 2019-05-19 MED ORDER — DIPHENHYDRAMINE HCL 50 MG/ML IJ SOLN: 50.0000 mg | Freq: Once | INTRAMUSCULAR | Status: DC | PRN

## 2019-05-19 MED ORDER — DIPHENHYDRAMINE HCL 25 MG PO CAPS
50.0000 mg | ORAL_CAPSULE | Freq: Every day | ORAL | Status: DC
  Administered 2019-05-19 – 2019-05-20 (×2): 50 mg via ORAL
  Filled 2019-05-19 (×2): qty 2

## 2019-05-19 MED ORDER — LITHIUM CARBONATE ER 450 MG PO TBCR
450.0000 mg | EXTENDED_RELEASE_TABLET | Freq: Two times a day (BID) | ORAL | Status: DC
  Administered 2019-05-19 – 2019-05-21 (×4): 450 mg via ORAL
  Filled 2019-05-19 (×4): qty 1

## 2019-05-19 MED ORDER — DIVALPROEX SODIUM ER 500 MG PO TB24
1000.0000 mg | ORAL_TABLET | Freq: Every day | ORAL | Status: DC
  Administered 2019-05-19 – 2019-05-20 (×2): 1000 mg via ORAL
  Filled 2019-05-19: qty 4
  Filled 2019-05-19: qty 2

## 2019-05-19 MED ORDER — ZIPRASIDONE MESYLATE 20 MG IM SOLR
10.0000 mg | Freq: Once | INTRAMUSCULAR | Status: AC
  Administered 2019-05-19: 10 mg via INTRAMUSCULAR

## 2019-05-19 MED ORDER — OLANZAPINE 10 MG IM SOLR: 10.0000 mg | Freq: Once | INTRAMUSCULAR | Status: DC | PRN

## 2019-05-19 MED ORDER — LORAZEPAM 1 MG PO TABS: 1.0000 mg | ORAL_TABLET | Freq: Once | ORAL | Status: DC

## 2019-05-19 MED ORDER — BUPROPION HCL ER (XL) 150 MG PO TB24
150.0000 mg | ORAL_TABLET | Freq: Every day | ORAL | Status: DC
  Administered 2019-05-20 – 2019-05-21 (×2): 150 mg via ORAL
  Filled 2019-05-19 (×2): qty 1

## 2019-05-19 MED ORDER — TRAZODONE HCL 100 MG PO TABS
200.0000 mg | ORAL_TABLET | Freq: Every day | ORAL | Status: DC
  Administered 2019-05-19 – 2019-05-20 (×2): 200 mg via ORAL
  Filled 2019-05-19 (×2): qty 2

## 2019-05-19 NOTE — BH Assessment (Addendum)
TTS was unable to complete synchronous audio/video assessment due to patient being uncooperative and/or drowsy.   Upon assessment, patient was sitting on the floor on a mattress in her room. Patent denied suicidality and refused to answer any other questions.  Patient had her eyes closed and appeared to have some experience of tics.  Patient hit her head on the wall and TTS inquired if it was intentional or accidental. Patient described it as an accident. She then placed herself in a lying position on her bed, closed her eyes, and started snoring. TTS informed RN Larene Beach of the patient hitting her head.

## 2019-05-19 NOTE — ED Notes (Signed)
X-ray at bedside

## 2019-05-19 NOTE — ED Notes (Signed)
Bed placed back in patients room but pt refusing to sit in bed at this time. Mattress remains on floor and pt sitting on mattress. Pt attempted to talk to TTS but this RN was told pt refused to speak and at one point went to lay back in bed and hit head on the wall. This RN assessed pts head and asked pt if she hurt herself. Pt denies pain at this time. Pt continues to refuse to sit in bed. Pt laying in the fetal position but also refusing a blanket at this time. Meal placed next to patient but pt refusing food.

## 2019-05-19 NOTE — Progress Notes (Signed)
Patient refusing to talk at this time, PRN agitation medication provided by the EDP.  Waylan Boga, PMHNP
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
## 2019-05-19 NOTE — ED Provider Notes (Signed)
[facility] Emergency Department Provider Note  ____________________________________________   First MD Initiated Contact with Patient 05/19/19 1414     (approximate)  I have reviewed the triage vital signs and the nursing notes.   HISTORY  Chief Complaint IVC    HPI Autumn [name] is a 43 y.o. female with past medical history as below including history of bipolar disorder, substance abuse, here with reported erratic behavior.  History is somewhat limited due to altered mental status.  However, the patient states she is here because she "did some things."  She states that she was "acting silly" because her friends told her to.  When further questioned, she says she cannot explain.  She does admit that she has been taking her lithium only but is unable to tell me how much or how often.  She states that she has had difficulty sleeping and feels very "energetic."  She reportedly was taken to [facility] today by her friends who were concerned about her, and she subsequently was brought here under IVC.  Denies any pain currently.  Denies any current intoxication, does admit to using heroin and potentially methamphetamine over the last several days.        Past Medical History:  Diagnosis Date  . Allergy    Seasonal, Ultram (seizures)  . Anemia 2005  . Bipolar 1 disorder ([place])   . Glaucoma   . Seizures ([place]) 2008    Patient Active Problem List   Diagnosis Date Noted  . Opiate abuse, continuous ([place]) 11/21/2018  . Bipolar I disorder, most recent episode depressed, severe without psychotic features ([place]) 07/05/2017  . Tobacco use disorder 06/27/2017  . 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 Opioid use disorder, severe, dependence ([place]) 06/27/2017  . Cannabis use disorder, severe, dependence ([place]) 06/27/2017  . Hx of glaucoma 10/02/2013    Past Surgical History:  Procedure Laterality Date  . BREAST BIOPSY Right    x 2  . CESAREAN SECTION      x 2    Prior to Admission medications   Medication Sig Start Date End Date Taking? Authorizing Provider  buPROPion (WELLBUTRIN XL) 150 MG 24 hr tablet Take 1 tablet (150 mg total) by mouth daily. 12/01/18   [name], Herma Ard B, MD  diphenhydrAMINE (BENADRYL) 50 MG capsule Take 1 capsule (50 mg total) by mouth at bedtime. 11/30/18   [name], Herma Ard B, MD  divalproex (DEPAKOTE ER) 500 MG 24 hr tablet Take 2 tablets (1,000 mg total) by mouth at bedtime. 11/30/18   [name], [name], MD  haloperidol (HALDOL) 5 MG tablet Take 1 tablet (5 mg total) by mouth at bedtime. 11/30/18   [name], Herma Ard B, MD  lithium carbonate (ESKALITH) 450 MG CR tablet Take 1 tablet (450 mg total) by mouth every 12 (twelve) hours. 11/30/18   [name], Wardell Honour, MD  Norgestimate-Ethinyl Estradiol Triphasic (ORTHO TRI-CYCLEN, 28,) 0.18/0.215/0.25 MG-35 MCG tablet Take 1 tablet by mouth daily.    [provider]  traZODone (DESYREL) 100 MG tablet Take 2 tablets (200 mg total) by mouth at bedtime. 11/30/18   [name], Wardell Honour, MD    Allergies Haldol [haloperidol lactate] and Ultram [tramadol]  Family History  Problem Relation Age of Onset  . Hypertension Mother   . Hyperlipidemia Mother   . Parkinson's disease Father     Social History Social History   Tobacco Use  . Smoking status: Current Every Day Smoker    Packs/day: 1.00    Years:  9.00    Pack years: 9.00  . Smokeless tobacco: Never Used  . Tobacco comment: Patient not interested  Substance Use Topics  . Alcohol use: Yes    Alcohol/week: 4.0 standard drinks    Types: 4 Cans of beer per week    Comment: Ocassional  . Drug use: Yes    Types: Marijuana    Comment: percocet -last use over 1 week ago. Heroin-last used 2/11    Review of Systems  Review of Systems  Constitutional: Negative for fatigue and fever.  HENT: Negative for congestion and sore throat.   Eyes: Negative for visual disturbance.  Respiratory: Negative for  cough and shortness of breath.   Cardiovascular: Negative for chest pain.  Gastrointestinal: Negative for abdominal pain, diarrhea, nausea and vomiting.  Genitourinary: Negative for flank pain.  Musculoskeletal: Negative for back pain and neck pain.  Skin: Negative for rash and wound.  Neurological: Negative for weakness.  Psychiatric/Behavioral: Positive for agitation, decreased concentration and sleep disturbance. The patient is nervous/anxious.      ____________________________________________  PHYSICAL EXAM:      VITAL SIGNS: ED Triage Vitals  Enc Vitals Group     BP 05/19/19 1355 125/83     Pulse Rate 05/19/19 1355 (!) 119     Resp 05/19/19 1355 16     Temp 05/19/19 1355 98.7 F (37.1 C)     Temp Source 05/19/19 1355 Oral     SpO2 05/19/19 1355 98 %     Weight 05/19/19 1350 115 lb (52.2 kg)     Height 05/19/19 1350 5\' 2"  (1.575 m)     Head Circumference --      Peak Flow --      Pain Score 05/19/19 1350 0     Pain Loc --      Pain Edu? --      Excl. in [place]? --      Physical Exam Vitals signs and nursing note reviewed.  Constitutional:      General: She is not in acute distress.    Appearance: She is well-developed.     Comments: Disheveled  HENT:     Head: Normocephalic and atraumatic.  Eyes:     Conjunctiva/sclera: Conjunctivae normal.  Neck:     Musculoskeletal: Neck supple.  Cardiovascular:     Rate and Rhythm: Normal rate and regular rhythm.     Heart sounds: Normal heart sounds. No murmur. No friction rub.  Pulmonary:     Effort: Pulmonary effort is normal. No respiratory distress.     Breath sounds: Normal breath sounds. No wheezing or rales.  Abdominal:     General: There is no distension.     Palpations: Abdomen is soft.     Tenderness: There is no abdominal tenderness.  Skin:    General: Skin is warm.     Capillary Refill: Capillary refill takes less than 2 seconds.  Neurological:     Mental Status: She is alert and oriented to person, place,  and time.     Motor: No abnormal muscle tone.  Psychiatric:        Mood and Affect: Mood is anxious. Affect is labile.        Behavior: Behavior is agitated.        Judgment: Judgment is impulsive and inappropriate.       ____________________________________________   LABS (all labs ordered are listed, but only abnormal results are displayed)  Labs Reviewed  COMPREHENSIVE METABOLIC PANEL - Abnormal; Notable for  the following components:      Result Value   Glucose, Bld 130 (*)    AST 8 (*)    All other components within normal limits  ACETAMINOPHEN LEVEL - Abnormal; Notable for the following components:   Acetaminophen (Tylenol), Serum <10 (*)    All other components within normal limits  CBC - Abnormal; Notable for the following components:   WBC 21.1 (*)    All other components within normal limits  ETHANOL  SALICYLATE LEVEL  URINE DRUG SCREEN, QUALITATIVE ([facility] ONLY)  POC URINE PREG, ED    ____________________________________________  EKG: None ________________________________________  RADIOLOGY All imaging, including plain films, CT scans, and ultrasounds, independently reviewed by me, and interpretations confirmed via formal radiology reads.  ED MD interpretation:   None  Official radiology report(s): No results found.  ____________________________________________  PROCEDURES   Procedure(s) performed (including Critical Care):  Procedures  ____________________________________________  INITIAL IMPRESSION / MDM / [place] / ED COURSE  As part of my medical decision making, I reviewed the following data within the electronic MEDICAL RECORD NUMBER Notes from prior ED visits and [place] Controlled Substance Database      *Autumn [name] was evaluated in Emergency Department on 05/19/2019 for the symptoms described in the history of present illness. She was evaluated in the context of the global COVID-19 pandemic, which necessitated consideration  that the patient might be at risk for infection with the SARS-CoV-2 virus that causes COVID-19. Institutional protocols and algorithms that pertain to the evaluation of patients at risk for COVID-19 are in a state of rapid change based on information released by regulatory bodies including the CDC and federal and state organizations. These policies and algorithms were followed during the patient's care in the ED.  Some ED evaluations and interventions may be delayed as a result of limited staffing during the pandemic.*      Medical Decision Making: 43 year old female with history of polysubstance abuse and bipolar disorder here with reported altered mental status.  While she initially reports drug use, interestingly the urine drug screen is negative.  She admits she has not been taking it of her medications.  Concern for decompensated mania versus less likely substance-induced mood disorder.  No apparent emergent medical pathology.  Ativan given, will consult TTS and psychiatry.  ____________________________________________  FINAL CLINICAL IMPRESSION(S) / ED DIAGNOSES  Final diagnoses:  Agitation  Mania ([place])     MEDICATIONS GIVEN DURING THIS VISIT:  Medications - No data to display   ED Discharge Orders    None       Note:  This document was prepared using Dragon voice recognition software and may include unintentional dictation errors.   Duffy Bruce, MD 05/19/19 1452

## 2019-05-19 NOTE — ED Notes (Signed)
Pt. Walked out of room to bathroom with no shirt, pt. Advised that when she came out of room into common area she needed to be fully clothed.  Pt. apologized and stated she felt hot in room.

## 2019-05-19 NOTE — BH Assessment (Signed)
TTS is available via tele [phone].

## 2019-05-19 NOTE — ED Notes (Signed)
Pt. Introduced to unit, pt. Advised of cameras and safety checks.  Pt. Requested and was given drink.

## 2019-05-19 NOTE — ED Notes (Signed)
Pt asked to go to bathroom. While ambulating to bathroom pt pulled down pants in hallway. Pt was in bathroom maybe 15 seconds, heard toilet flushed. When pt emerged she went back to room. Cigarette smoke smell from bathroom, very strong. Officer, RN, and EDT at bedside. Unable to find lighter or other cigarettes. Agricultural consultant and EDP notified. When smoke was smelled in bathroom pt stripped off shirt and pants and refused to put them back on, stated "I ain't hiding nothing in my pussy." Pt continues to act erratic and yelling at staff members.

## 2019-05-19 NOTE — ED Notes (Signed)
Pt resting calmly on mattress. Mattress is on the floor with no sheet at this time. Pt is cooperative at this time.

## 2019-05-19 NOTE — ED Notes (Signed)
Pt ambulatory to treatment room with steady gait. Wanded by officer upon arrival to treatment room.

## 2019-05-19 NOTE — ED Notes (Signed)
Pt dressed out by this RN and Dorian, EDT. Pt belongings placed in belongings bag and labeled with pt label. Pt had with her  1 pink hair tie 1 pink bra 1 pair of light purple underwear 1 pair of navy blue and white shorts 1 purple stonewashed short sleeve shirt

## 2019-05-19 NOTE — ED Notes (Addendum)
When this RN and EDP walked into room pt was laying on stomach and had ripped shirt off. Asked pt to place shirt back on and she did. Pt then sat up. Would look at EDP when he was talking to pt but when pt talked she would look away. Appeared like she didn't want to open up. States she has some friends who were concerned about her and called the cops. She states the cops took her to [place] where she states she "wasn't acting herself." She states "the cops arrested me but instead of arresting me they brought me here." Pt states that she did "stupid stuff" last night and this AM but won't go into further detail about what she did. She does admit to heroin a few days ago. States has been sober since. When assessment was finished pt laid back down.

## 2019-05-19 NOTE — ED Notes (Signed)
Pt noted to have stripped shirt off and is nude from waist up. This EDT asked if she would place shirt back on and was ignored. Nurse notified.

## 2019-05-19 NOTE — ED Triage Notes (Signed)
Pt to ED via [employer] from [place] under IVC. Per officer pt was at [facility] and was jumping around and standing on the furniture. Pt denies taking any drugs or medication. Pt states that she is not thought of harming herself or anyone else. Pt states that she was at [facility] for "not acting like myself".

## 2019-05-19 NOTE — ED Notes (Signed)
Pt standing in doorway yelling at staff, cursing noted. EDP informed.

## 2019-05-20 DIAGNOSIS — R462 Strange and inexplicable behavior: Secondary | ICD-10-CM

## 2019-05-20 DIAGNOSIS — Z590 Homelessness: Secondary | ICD-10-CM

## 2019-05-20 DIAGNOSIS — F3113 Bipolar disorder, current episode manic without psychotic features, severe: Secondary | ICD-10-CM

## 2019-05-20 DIAGNOSIS — Z915 Personal history of self-harm: Secondary | ICD-10-CM

## 2019-05-20 DIAGNOSIS — R451 Restlessness and agitation: Secondary | ICD-10-CM

## 2019-05-20 DIAGNOSIS — F3110 Bipolar disorder, current episode manic without psychotic features, unspecified: Secondary | ICD-10-CM | POA: Diagnosis present

## 2019-05-20 MED ORDER — CEPHALEXIN 500 MG PO CAPS: 500.0000 mg | ORAL_CAPSULE | Freq: Three times a day (TID) | ORAL | Status: DC

## 2019-05-20 MED ORDER — NICOTINE 21 MG/24HR TD PT24
21.0000 mg | MEDICATED_PATCH | Freq: Once | TRANSDERMAL | Status: DC
  Administered 2019-05-20: 22:00:00 21 mg via TRANSDERMAL
  Filled 2019-05-20: qty 1

## 2019-05-20 NOTE — ED Notes (Signed)
Hourly rounding reveals patient in room. No complaints, stable, in no acute distress. Q15 minute rounds and monitoring via Security Cameras to continue. 

## 2019-05-20 NOTE — ED Notes (Signed)
Patient is in dayroom speaking with another patient. Maintained on 15 minute checks and observation by security camera for safety.

## 2019-05-20 NOTE — ED Notes (Signed)
Patient in day room sitting calmly in chair.

## 2019-05-20 NOTE — ED Notes (Signed)
Report to include Situation, Background, Assessment, and Recommendations received from [facility]. Patient alert and oriented, warm and dry, in no acute distress. Patient denies SI, HI, AVH and pain. Patient made aware of Q15 minute rounds and security cameras for their safety. Patient instructed to come to me with needs or concerns.

## 2019-05-20 NOTE — ED Provider Notes (Signed)
-----------------------------------------   7:20 AM on 05/20/2019 -----------------------------------------   Blood pressure 125/83, pulse (!) 119, temperature 98.7 F (37.1 C), temperature source Oral, resp. rate 16, height 1.575 m (5\' 2" ), weight 52.2 kg, SpO2 98 %.  The patient is calm and cooperative at this time.  There have been no acute events since the last update.  Awaiting disposition plan from Behavioral Medicine and/or Social Work team(s).  Patient to be reassessed by Behavioral Medicine today.   Hinda Kehr, MD 05/20/19 [phone]
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
## 2019-05-21 MED ORDER — DIVALPROEX SODIUM ER 500 MG PO TB24: 1000.0000 mg | ORAL_TABLET | Freq: Every day | ORAL | 1 refills | Status: DC

## 2019-05-21 MED ORDER — TRAZODONE HCL 100 MG PO TABS: 200.0000 mg | ORAL_TABLET | Freq: Every day | ORAL | 1 refills | Status: DC

## 2019-05-21 MED ORDER — LITHIUM CARBONATE ER 450 MG PO TBCR: 450.0000 mg | EXTENDED_RELEASE_TABLET | Freq: Two times a day (BID) | ORAL | 1 refills | Status: DC

## 2019-05-21 MED ORDER — BUPROPION HCL ER (XL) 150 MG PO TB24: 150.0000 mg | ORAL_TABLET | Freq: Every day | ORAL | 1 refills | Status: DC

## 2019-05-21 NOTE — ED Notes (Signed)
Hourly rounding reveals patient in room. No complaints, stable, in no acute distress. Q15 minute rounds and monitoring via Security Cameras to continue. 

## 2019-05-21 NOTE — ED Provider Notes (Signed)
Patient in IVC rescinded, cleared for discharge by psychiatry.  Return precautions and treatment recommendations and follow-up provided to the patient who is agreeable with the plan.    Delman Kitten, MD 05/21/19 1119

## 2019-05-21 NOTE — ED Notes (Signed)

## 2019-05-21 NOTE — ED Notes (Signed)
IVC PAPERS  RESCINDED  PER  JAMIE  LORD NP  INFORMED  AMY  T  RN

## 2019-05-21 NOTE — ED Notes (Signed)
BEHAVIORAL HEALTH ROUNDING Patient sleeping: Yes.   Patient alert and oriented: eyes closed  Appears asleep Behavior appropriate: Yes.  ; If no, describe:  Nutrition and fluids offered: Yes  Toileting and hygiene offered: sleeping Sitter present: q 15 minute observations and security camera monitoring Law enforcement present: yes  ODS 
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
## 2019-05-21 NOTE — ED Notes (Signed)
Patient observed lying in bed with eyes closed  Even, unlabored respirations observed   NAD pt appears to be sleeping  I will continue to monitor along with every 15 minute visual observations and ongoing security camera monitoring    

## 2019-05-21 NOTE — Discharge Instructions (Addendum)
Follow up with [facility] for therapy and medication management:  [phone]  Bipolar 1 Disorder Bipolar 1 disorder is a mental health disorder in which a person has episodes of emotional highs (mania), and may also have episodes of emotional lows (depression) in addition to highs. Bipolar 1 disorder is different from other bipolar disorders because it involves extreme manic episodes. These episodes last at least one week or involve symptoms that are so severe that hospitalization is needed to keep the person safe. What increases the risk? The cause of this condition is not known. However, certain factors make you more likely to have bipolar disorder, such as: Having a family member with the disorder. An imbalance of certain chemicals in the brain (neurotransmitters). Stress, such as illness, financial problems, or a death. Certain conditions that affect the brain or spinal cord (neurologic conditions). Brain injury (trauma). Having another mental health disorder, such as: Obsessive compulsive disorder. Schizophrenia. What are the signs or symptoms? Symptoms of mania include: Very high self-esteem or self-confidence. Decreased need for sleep. Unusual talkativeness or feeling a need to keep talking. Speech may be very fast. It may seem like you cannot stop talking. Racing thoughts or constant talking, with quick shifts between topics that may or may not be related (flight of ideas). Decreased ability to focus or concentrate. Increased purposeful activity, such as work, studies, or social activity. Increased nonproductive activity. This could be pacing, squirming and fidgeting, or finger and toe tapping. Impulsive behavior and poor judgment. This may result in high-risk activities, such as having unprotected sex or spending a lot of money. Symptoms of depression include: Feeling sad, hopeless, or helpless. Frequent or uncontrollable crying. Lack of feeling or caring about anything. Sleeping too  much. Moving more slowly than usual. Not being able to enjoy things you used to enjoy. Wanting to be alone all the time. Feeling guilty or worthless. Lack of energy or motivation. Trouble concentrating or remembering. Trouble making decisions. Increased appetite. Thoughts of death, or the desire to harm yourself. Sometimes, you may have a mixed mood. This means having symptoms of depression and mania. Stress can make symptoms worse. How is this diagnosed? To diagnose bipolar disorder, your health care provider may ask about your: Emotional episodes. Medical history. Alcohol and drug use. This includes prescription medicines. Certain medical conditions and substances can cause symptoms that seem like bipolar disorder (secondary bipolar disorder). How is this treated? Bipolar disorder is a long-term (chronic) illness. It is best controlled with ongoing (continuous) treatment rather than treatment only when symptoms occur. Treatment may include: Medicine. Medicine can be prescribed by a provider who specializes in treating mental disorders (psychiatrist). Medicines called mood stabilizers are usually prescribed. If symptoms occur even while taking a mood stabilizer, other medicines may be added. Psychotherapy. Some forms of talk therapy, such as cognitive-behavioral therapy (CBT), can provide support, education, and guidance. Coping methods, such as journaling or relaxation exercises. These may include: Yoga. Meditation. Deep breathing. Lifestyle changes, such as: Limiting alcohol and drug use. Exercising regularly. Getting plenty of sleep. Making healthy eating choices.  A combination of medicine, talk therapy, and coping methods is best. A procedure in which electricity is applied to the brain through the scalp (electroconvulsive therapy) may be used in cases of severe mania when medicine and psychotherapy work too slowly or do not work. Follow these instructions at  home: Activity  Return to your normal activities as told by your health care provider. Find activities that you enjoy, and make time  to do them. Exercise regularly as told by your health care provider. Lifestyle Limit alcohol intake to no more than 1 drink a day for nonpregnant women and 2 drinks a day for men. One drink equals 12 oz of beer, 5 oz of wine, or 1 oz of hard liquor. Follow a set schedule for eating and sleeping. Eat a balanced diet that includes fresh fruits and vegetables, whole grains, low-fat dairy, and lean meat. Get 7-8 hours of sleep each night. General instructions Take over-the-counter and prescription medicines only as told by your health care provider. Think about joining a support group. Your health care provider may be able to recommend a support group. Talk with your family and loved ones about your treatment goals and how they can help. Keep all follow-up visits as told by your health care provider. This is important. Where to find more information For more information about bipolar disorder, visit the following websites: [employer] on Mental Illness: www.nami.St. [place]: https://carter.com/ Contact a health care provider if: Your symptoms get worse. You have side effects from your medicine, and they get worse. You have trouble sleeping. You have trouble doing daily activities. You feel unsafe in your surroundings. You are dealing with substance abuse. Get help right away if: You have new symptoms. You have thoughts about harming yourself. You self-harm. This information is not intended to replace advice given to you by your health care provider. Make sure you discuss any questions you have with your health care provider. Document Released: 01/31/2001 Document Revised: 10/07/2017 Document Reviewed: 06/24/2016 Elsevier Patient Education  2020 [employer].

## 2019-05-21 NOTE — ED Provider Notes (Signed)
-----------------------------------------   6:30 AM on 05/21/2019 -----------------------------------------   Blood pressure 107/61, pulse (!) 103, temperature 98.4 F (36.9 C), temperature source Oral, resp. rate 18, height 5\' 2"  (1.575 m), weight 52.2 kg, SpO2 100 %.  The patient is calm and cooperative at this time.  There have been no acute events since the last update.  Awaiting disposition plan from Behavioral Medicine and/or Social Work team(s).   Paulette Blanch, MD 05/21/19 [phone]

## 2019-05-21 NOTE — Progress Notes (Signed)
Collateral information from Glynda Jaeger [phone]) who is agreeable to come and get the patient in an hour.  They live together and is glad she is getting sleep as she was not sleeping prior to admission which facilitated this episode and not taking her medications.  He has no safety concerns at this time.    Waylan Boga, PMHNP

## 2019-05-21 NOTE — ED Notes (Signed)
ED BHU [place] Is the patient under IVC or is there intent for IVC: Yes.   Is the patient medically cleared: Yes.   Is there vacancy in the ED BHU: Yes.   Is the population mix appropriate for patient: Yes.   Is the patient awaiting placement in inpatient or outpatient setting:  Has the patient had a psychiatric consult: Yes. - reassessment completed  - pt to discharge home once colateral info received   Survey of unit performed for contraband, proper placement and condition of furniture, tampering with fixtures in bathroom, shower, and each patient room: Yes.  ; Findings:  APPEARANCE/BEHAVIOR Calm and cooperative NEURO ASSESSMENT Orientation: oriented x3  Denies pain Hallucinations: No.None noted (Hallucinations) denies Speech: Normal Gait: normal RESPIRATORY ASSESSMENT Even  Unlabored respirations  CARDIOVASCULAR ASSESSMENT Pulses equal   regular rate  Skin warm and dry   GASTROINTESTINAL ASSESSMENT no GI complaint EXTREMITIES Full ROM  PLAN OF CARE Provide calm/safe environment. Vital signs assessed twice daily. ED BHU Assessment once each 12-hour shift. Assure the ED provider has rounded once each shift. Provide and encourage hygiene. Provide redirection as needed. Assess for escalating behavior; address immediately and inform ED provider.  Assess family dynamic and appropriateness for visitation as needed: Yes.  ; If necessary, describe findings:  Educate the patient/family about BHU procedures/visitation: Yes.  ; If necessary, describe findings:

## 2019-05-22 ENCOUNTER — Ambulatory Visit: Payer: Self-pay | Admitting: Maternal Newborn

## 2019-05-25 ENCOUNTER — Inpatient Hospital Stay
Admission: AD | Admit: 2019-05-25 | Discharge: 2019-05-30 | DRG: 885 | Disposition: A | Discharge status: Home / Self Care | Payer: No Typology Code available for payment source | Source: Intra-hospital | Attending: Psychiatry | Admitting: Psychiatry

## 2019-05-25 ENCOUNTER — Other Ambulatory Visit: Payer: Self-pay

## 2019-05-25 ENCOUNTER — Emergency Department
Admission: EM | Admit: 2019-05-25 | Discharge: 2019-05-25 | Disposition: A | Discharge status: Other Acute Inpatient Hospital | Payer: Medicaid Other | Attending: Emergency Medicine | Admitting: Emergency Medicine

## 2019-05-25 DIAGNOSIS — G40909 Epilepsy, unspecified, not intractable, without status epilepticus: Secondary | ICD-10-CM | POA: Diagnosis present

## 2019-05-25 DIAGNOSIS — Z9114 Patient's other noncompliance with medication regimen: Secondary | ICD-10-CM | POA: Diagnosis not present

## 2019-05-25 DIAGNOSIS — Z560 Unemployment, unspecified: Secondary | ICD-10-CM

## 2019-05-25 DIAGNOSIS — Z046 Encounter for general psychiatric examination, requested by authority: Secondary | ICD-10-CM | POA: Insufficient documentation

## 2019-05-25 DIAGNOSIS — Z888 Allergy status to other drugs, medicaments and biological substances status: Secondary | ICD-10-CM

## 2019-05-25 DIAGNOSIS — Z885 Allergy status to narcotic agent status: Secondary | ICD-10-CM | POA: Diagnosis not present

## 2019-05-25 DIAGNOSIS — Z915 Personal history of self-harm: Secondary | ICD-10-CM

## 2019-05-25 DIAGNOSIS — Z590 Homelessness: Secondary | ICD-10-CM | POA: Diagnosis not present

## 2019-05-25 DIAGNOSIS — F17210 Nicotine dependence, cigarettes, uncomplicated: Secondary | ICD-10-CM | POA: Diagnosis present

## 2019-05-25 DIAGNOSIS — Z79899 Other long term (current) drug therapy: Secondary | ICD-10-CM | POA: Diagnosis not present

## 2019-05-25 DIAGNOSIS — Z597 Insufficient social insurance and welfare support: Secondary | ICD-10-CM | POA: Diagnosis not present

## 2019-05-25 DIAGNOSIS — F319 Bipolar disorder, unspecified: Secondary | ICD-10-CM | POA: Diagnosis present

## 2019-05-25 DIAGNOSIS — F3110 Bipolar disorder, current episode manic without psychotic features, unspecified: Secondary | ICD-10-CM | POA: Diagnosis present

## 2019-05-25 DIAGNOSIS — F3112 Bipolar disorder, current episode manic without psychotic features, moderate: Secondary | ICD-10-CM | POA: Diagnosis not present

## 2019-05-25 DIAGNOSIS — R462 Strange and inexplicable behavior: Secondary | ICD-10-CM

## 2019-05-25 DIAGNOSIS — Z20828 Contact with and (suspected) exposure to other viral communicable diseases: Secondary | ICD-10-CM | POA: Insufficient documentation

## 2019-05-25 DIAGNOSIS — H409 Unspecified glaucoma: Secondary | ICD-10-CM | POA: Diagnosis present

## 2019-05-25 DIAGNOSIS — F3162 Bipolar disorder, current episode mixed, moderate: Principal | ICD-10-CM | POA: Diagnosis present

## 2019-05-25 DIAGNOSIS — F17200 Nicotine dependence, unspecified, uncomplicated: Secondary | ICD-10-CM | POA: Insufficient documentation

## 2019-05-25 DIAGNOSIS — R410 Disorientation, unspecified: Secondary | ICD-10-CM

## 2019-05-25 DIAGNOSIS — Z793 Long term (current) use of hormonal contraceptives: Secondary | ICD-10-CM

## 2019-05-25 DIAGNOSIS — F1990 Other psychoactive substance use, unspecified, uncomplicated: Secondary | ICD-10-CM | POA: Diagnosis present

## 2019-05-25 DIAGNOSIS — F312 Bipolar disorder, current episode manic severe with psychotic features: Secondary | ICD-10-CM | POA: Insufficient documentation

## 2019-05-25 DIAGNOSIS — F1910 Other psychoactive substance abuse, uncomplicated: Secondary | ICD-10-CM | POA: Diagnosis not present

## 2019-05-25 DIAGNOSIS — F23 Brief psychotic disorder: Secondary | ICD-10-CM | POA: Insufficient documentation

## 2019-05-25 LAB — COMPREHENSIVE METABOLIC PANEL
ALT: 11 U/L (ref 0–44)
AST: 9 U/L — ABNORMAL LOW (ref 15–41)
Albumin: 4.1 g/dL (ref 3.5–5.0)
Alkaline Phosphatase: 56 U/L (ref 38–126)
Anion gap: 8 (ref 5–15)
BUN: 10 mg/dL (ref 6–20)
CO2: 24 mmol/L (ref 22–32)
Calcium: 9.5 mg/dL (ref 8.9–10.3)
Chloride: 104 mmol/L (ref 98–111)
Creatinine, Ser: 0.67 mg/dL (ref 0.44–1.00)
GFR calc Af Amer: >60 mL/min (ref >60)
GFR calc non Af Amer: >60 mL/min (ref >60)
Glucose, Bld: 120 mg/dL — ABNORMAL HIGH (ref 70–99)
Potassium: 3.8 mmol/L (ref 3.5–5.1)
Sodium: 136 mmol/L (ref 135–145)
Total Bilirubin: 0.5 mg/dL (ref 0.3–1.2)
Total Protein: 6.8 g/dL (ref 6.5–8.1)

## 2019-05-25 LAB — POCT PREGNANCY, URINE: Preg Test, Ur: NEGATIVE

## 2019-05-25 LAB — CBC
HCT: 39.4 % (ref 36.0–46.0)
Hemoglobin: 13.3 g/dL (ref 12.0–15.0)
MCH: 31.2 pg (ref 26.0–34.0)
MCHC: 33.8 g/dL (ref 30.0–36.0)
MCV: 92.5 fL (ref 80.0–100.0)
Platelets: 278 10*3/uL (ref 150–400)
RBC: 4.26 MIL/uL (ref 3.87–5.11)
RDW: 12.3 % (ref 11.5–15.5)
WBC: 15.1 10*3/uL — ABNORMAL HIGH (ref 4.0–10.5)
nRBC: 0 % (ref 0.0–0.2)

## 2019-05-25 LAB — ACETAMINOPHEN LEVEL: Acetaminophen (Tylenol), Serum: <10 ug/mL — ABNORMAL LOW (ref 10–30)

## 2019-05-25 LAB — SALICYLATE LEVEL: Salicylate Lvl: <7 mg/dL (ref 2.8–30.0)

## 2019-05-25 LAB — URINE DRUG SCREEN, QUALITATIVE (ARMC ONLY)
Amphetamines, Ur Screen: NOT DETECTED
Barbiturates, Ur Screen: NOT DETECTED
Benzodiazepine, Ur Scrn: NOT DETECTED
Cannabinoid 50 Ng, Ur ~~LOC~~: NOT DETECTED
Cocaine Metabolite,Ur ~~LOC~~: NOT DETECTED
MDMA (Ecstasy)Ur Screen: NOT DETECTED
Methadone Scn, Ur: NOT DETECTED
Opiate, Ur Screen: NOT DETECTED
Phencyclidine (PCP) Ur S: NOT DETECTED
Tricyclic, Ur Screen: NOT DETECTED

## 2019-05-25 LAB — ETHANOL: Alcohol, Ethyl (B): <10 mg/dL (ref <10)

## 2019-05-25 LAB — SARS CORONAVIRUS 2 BY RT PCR (HOSPITAL ORDER, PERFORMED IN ~~LOC~~ HOSPITAL LAB): SARS Coronavirus 2: NEGATIVE

## 2019-05-25 MED ORDER — TRAZODONE HCL 100 MG PO TABS
200.0000 mg | ORAL_TABLET | Freq: Every day | ORAL | Status: DC
  Administered 2019-05-26 – 2019-05-29 (×4): 200 mg via ORAL
  Filled 2019-05-25 (×4): qty 2

## 2019-05-25 MED ORDER — TRAZODONE HCL 100 MG PO TABS
200.0000 mg | ORAL_TABLET | Freq: Every day | ORAL | Status: DC
  Administered 2019-05-25: 200 mg via ORAL
  Filled 2019-05-25: qty 2

## 2019-05-25 MED ORDER — NORGESTIM-ETH ESTRAD TRIPHASIC 0.18/0.215/0.25 MG-35 MCG PO TABS: 1.0000 | ORAL_TABLET | Freq: Every day | ORAL | Status: DC

## 2019-05-25 MED ORDER — MAGNESIUM HYDROXIDE 400 MG/5ML PO SUSP: 30.0000 mL | Freq: Every day | ORAL | Status: DC | PRN

## 2019-05-25 MED ORDER — LITHIUM CARBONATE ER 450 MG PO TBCR
450.0000 mg | EXTENDED_RELEASE_TABLET | Freq: Two times a day (BID) | ORAL | Status: DC
  Administered 2019-05-25: 450 mg via ORAL
  Filled 2019-05-25: qty 1

## 2019-05-25 MED ORDER — DIPHENHYDRAMINE HCL 50 MG/ML IJ SOLN
50.0000 mg | Freq: Once | INTRAMUSCULAR | Status: AC
  Administered 2019-05-25: 06:00:00 50 mg via INTRAMUSCULAR

## 2019-05-25 MED ORDER — LORAZEPAM 2 MG/ML IJ SOLN
2.0000 mg | Freq: Once | INTRAMUSCULAR | Status: AC
  Administered 2019-05-25: 2 mg via INTRAMUSCULAR

## 2019-05-25 MED ORDER — BUPROPION HCL ER (XL) 150 MG PO TB24
150.0000 mg | ORAL_TABLET | Freq: Every day | ORAL | Status: DC
  Administered 2019-05-25: 150 mg via ORAL
  Filled 2019-05-25: qty 1

## 2019-05-25 MED ORDER — LITHIUM CARBONATE ER 450 MG PO TBCR
450.0000 mg | EXTENDED_RELEASE_TABLET | Freq: Two times a day (BID) | ORAL | Status: DC
  Administered 2019-05-26 – 2019-05-30 (×9): 450 mg via ORAL
  Filled 2019-05-25 (×9): qty 1

## 2019-05-25 MED ORDER — HALOPERIDOL LACTATE 5 MG/ML IJ SOLN
INTRAMUSCULAR | Status: AC
  Administered 2019-05-25: 5 mg via INTRAMUSCULAR
  Filled 2019-05-25: qty 1

## 2019-05-25 MED ORDER — ALUM & MAG HYDROXIDE-SIMETH 200-200-20 MG/5ML PO SUSP: 30.0000 mL | ORAL | Status: DC | PRN

## 2019-05-25 MED ORDER — HALOPERIDOL LACTATE 5 MG/ML IJ SOLN
5.0000 mg | Freq: Once | INTRAMUSCULAR | Status: AC
  Administered 2019-05-25: 06:00:00 5 mg via INTRAMUSCULAR

## 2019-05-25 MED ORDER — BUPROPION HCL ER (XL) 150 MG PO TB24
150.0000 mg | ORAL_TABLET | Freq: Every day | ORAL | Status: DC
  Administered 2019-05-26 – 2019-05-30 (×5): 150 mg via ORAL
  Filled 2019-05-25 (×5): qty 1

## 2019-05-25 MED ORDER — DIVALPROEX SODIUM 500 MG PO DR TAB
500.0000 mg | DELAYED_RELEASE_TABLET | Freq: Two times a day (BID) | ORAL | Status: DC
  Administered 2019-05-26 – 2019-05-30 (×9): 500 mg via ORAL
  Filled 2019-05-25 (×10): qty 1

## 2019-05-25 MED ORDER — DIPHENHYDRAMINE HCL 50 MG/ML IJ SOLN
INTRAMUSCULAR | Status: AC
  Administered 2019-05-25: 50 mg via INTRAMUSCULAR
  Filled 2019-05-25: qty 1

## 2019-05-25 MED ORDER — DIVALPROEX SODIUM ER 250 MG PO TB24: 1000.0000 mg | ORAL_TABLET | Freq: Every day | ORAL | Status: DC

## 2019-05-25 MED ORDER — LORAZEPAM 2 MG/ML IJ SOLN
INTRAMUSCULAR | Status: AC
  Administered 2019-05-25: 2 mg via INTRAMUSCULAR
  Filled 2019-05-25: qty 1

## 2019-05-25 MED ORDER — DIVALPROEX SODIUM 500 MG PO DR TAB
500.0000 mg | DELAYED_RELEASE_TABLET | Freq: Two times a day (BID) | ORAL | Status: DC
  Administered 2019-05-25: 500 mg via ORAL
  Filled 2019-05-25: qty 1

## 2019-05-25 MED ORDER — ACETAMINOPHEN 325 MG PO TABS
650.0000 mg | ORAL_TABLET | Freq: Four times a day (QID) | ORAL | Status: DC | PRN
  Administered 2019-05-26: 650 mg via ORAL
  Filled 2019-05-25: qty 2

## 2019-05-25 NOTE — ED Provider Notes (Signed)
[facility] Emergency Department Provider Note   First MD Initiated Contact with Patient 05/25/19 [phone]     (approximate)  I have reviewed the triage vital signs and the nursing notes.  Level 5 caveat: History review of system and physical exam limited secondary to altered mental status HISTORY    HPI Autumn [name] is a 43 y.o. female with below list of previous medical conditions including polysubstance abuse bipolar disorder presents to the emergency department in police custody with very bizarre behavior patient very labile.  Patient patient was not on the patient's room picking up a chair to throw stating "I do not know what F... you monsters crazy people, no school and just working out".  On arrival to the emergency department patient states that she "snorted some kind of drug today"     Past Medical History:  Diagnosis Date  . Allergy    Seasonal, Ultram (seizures)  . Anemia 2005  . Bipolar 1 disorder ([place])   . Glaucoma   . Seizures ([place]) 2008    Patient Active Problem List   Diagnosis Date Noted  . Bipolar affective disorder, current episode manic ([place]) 05/20/2019  . Opiate abuse, continuous ([place]) 11/21/2018  . Bipolar I disorder, most recent episode depressed, severe without psychotic features ([place]) 07/05/2017  . Tobacco use disorder 06/27/2017  . 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 Opioid use disorder, severe, dependence ([place]) 06/27/2017  . Cannabis use disorder, severe, dependence ([place]) 06/27/2017  . Hx of glaucoma 10/02/2013    Past Surgical History:  Procedure Laterality Date  . BREAST BIOPSY Right    x 2  . CESAREAN SECTION     x 2    Prior to Admission medications   Medication Sig Start Date End Date Taking? Authorizing Provider  buPROPion (WELLBUTRIN XL) 150 MG 24 hr tablet Take 1 tablet (150 mg total) by mouth daily. 05/21/19   Patrecia Pour, NP  divalproex (DEPAKOTE ER) 500 MG 24 hr tablet Take 2  tablets (1,000 mg total) by mouth at bedtime. 05/21/19   Patrecia Pour, NP  lithium carbonate (ESKALITH) 450 MG CR tablet Take 1 tablet (450 mg total) by mouth every 12 (twelve) hours. 05/21/19   Patrecia Pour, NP  Norgestimate-Ethinyl Estradiol Triphasic (ORTHO TRI-CYCLEN, 28,) 0.18/0.215/0.25 MG-35 MCG tablet Take 1 tablet by mouth daily.    [provider]  traZODone (DESYREL) 100 MG tablet Take 2 tablets (200 mg total) by mouth at bedtime. 05/21/19   Patrecia Pour, NP    Allergies Haldol [haloperidol lactate] and Ultram [tramadol]  Family History  Problem Relation Age of Onset  . Hypertension Mother   . Hyperlipidemia Mother   . Parkinson's disease Father     Social History Social History   Tobacco Use  . Smoking status: Current Every Day Smoker    Packs/day: 1.00    Years: 9.00    Pack years: 9.00  . Smokeless tobacco: Never Used  . Tobacco comment: Patient not interested  Substance Use Topics  . Alcohol use: Yes    Alcohol/week: 4.0 standard drinks    Types: 4 Cans of beer per week    Comment: Ocassional  . Drug use: Yes    Types: Marijuana    Comment: percocet -last use over 1 week ago. Heroin-last used 2/11    Review of Systems Constitutional: No fever/chills Eyes: No visual changes. ENT: No sore throat. Cardiovascular: Denies chest pain. Respiratory: Denies shortness of breath.  Gastrointestinal: No abdominal pain.  No nausea, no vomiting.  No diarrhea.  No constipation. Genitourinary: Negative for dysuria. Musculoskeletal: Negative for neck pain.  Negative for back pain. Integumentary: Negative for rash. Neurological: Negative for headaches, focal weakness or numbness. Psychiatric:  Positive for bizarre behavior  ____________________________________________   PHYSICAL EXAM:  VITAL SIGNS: ED Triage Vitals [05/25/19 0521]  Enc Vitals Group     BP (!) 123/96     Pulse Rate (!) 104     Resp 18     Temp 98.1 F (36.7 C)     Temp src       SpO2 96 %     Weight 52.2 kg (115 lb 1.3 oz)     Height      Head Circumference      Peak Flow      Pain Score 0     Pain Loc      Pain Edu?      Excl. in [place]?     Constitutional: Alert.  Verbally abusive to staff erratic and aggressive behavior Eyes: Conjunctivae are normal.  Head: Atraumatic. Mouth/Throat: Mucous membranes are moist.  Oropharynx non-erythematous. Neck: No stridor.  Cardiovascular: Normal rate, regular rhythm. Good peripheral circulation. Grossly normal heart sounds. Respiratory: Normal respiratory effort.  No retractions. No audible wheezing. Gastrointestinal: Soft and nontender. No distention.  Musculoskeletal: No lower extremity tenderness nor edema. No gross deformities of extremities. Neurologic: Pressured speech. No gross focal neurologic deficits are appreciated.  Skin:  Skin is warm, dry and intact. No rash noted. Psychiatric: Pressured nonsensical speech  ____________________________________________   LABS (all labs ordered are listed, but only abnormal results are displayed)  Labs Reviewed  COMPREHENSIVE METABOLIC PANEL - Abnormal; Notable for the following components:      Result Value   Glucose, Bld 120 (*)    AST 9 (*)    All other components within normal limits  ACETAMINOPHEN LEVEL - Abnormal; Notable for the following components:   Acetaminophen (Tylenol), Serum <10 (*)    All other components within normal limits  CBC - Abnormal; Notable for the following components:   WBC 15.1 (*)    All other components within normal limits  ETHANOL  SALICYLATE LEVEL  URINE DRUG SCREEN, QUALITATIVE ([facility] ONLY)  POC URINE PREG, ED  POCT PREGNANCY, URINE   ________________   Procedures   ____________________________________________   INITIAL IMPRESSION / MDM / [place] / ED COURSE  As part of my medical decision making, I reviewed the following data within the electronic MEDICAL RECORD NUMBER  43 year old female present with  above-stated history and physical exam secondary to acute psychosis.  Given patient's labile and at times aggressive behavior patient received IV Haldol 5 mg Benadryl 50 mg and Ativan 2 mg she postarrest both to herself and staff.  On reevaluation patient resting comfortably.  Vital signs stable.  Awaiting psychiatry evaluation  ____________________________________________  FINAL CLINICAL IMPRESSION(S) / ED DIAGNOSES  Final diagnoses:  Acute psychosis ([place])     MEDICATIONS GIVEN DURING THIS VISIT:  Medications  diphenhydrAMINE (BENADRYL) injection 50 mg (50 mg Intramuscular Given 05/25/19 0613)  haloperidol lactate (HALDOL) injection 5 mg (5 mg Intramuscular Given 05/25/19 0612)  LORazepam (ATIVAN) injection 2 mg (2 mg Intramuscular Given 05/25/19 6010)     ED Discharge Orders    None      *Please note:  Autumn [name] was evaluated in Emergency Department on 05/25/2019 for the symptoms described in the history of present illness.  She was evaluated in the context of the global COVID-19 pandemic, which necessitated consideration that the patient might be at risk for infection with the SARS-CoV-2 virus that causes COVID-19. Institutional protocols and algorithms that pertain to the evaluation of patients at risk for COVID-19 are in a state of rapid change based on information released by regulatory bodies including the CDC and federal and state organizations. These policies and algorithms were followed during the patient's care in the ED.  Some ED evaluations and interventions may be delayed as a result of limited staffing during the pandemic.*  Note:  This document was prepared using Dragon voice recognition software and may include unintentional dictation errors.   Gregor Hams, MD 05/25/19 [phone]

## 2019-05-25 NOTE — ED Notes (Signed)
Hourly rounding reveals patient sleeping in room. No complaints, stable, in no acute distress. Q15 minute rounds and monitoring via Security to continue. 

## 2019-05-25 NOTE — Consult Note (Signed)
[place] Psychiatry Consult   Reason for Consult:  Mania  Referring Physician:  EDP Patient Identification: Autumn [name] MRN:  [id number] Principal Diagnosis: bipolar affective disorder Diagnosis: bipolar affective disorder  Total Time spent with patient: 1 hour  Subjective:   Autumn [name] is a 43 y.o. female patient reported  Patient seen and evaluated in person.  She was disorganized and stumbling around the room.  Reported she took something (drug) prior to admission but negative UDS.  She came to the ED manic last weekend but stabilized quickly after medications initiated and sleep was obtained.  Indicated she did not take any medications after leaving and returned to the hospital with bizarre behaviors.  Difficult to assess as she is stumbling around the room with minimal verbal interaction.  Inpatient admission recommended.  HPI:  Per TTS:  43 y.o. female Who presents to the ER via law enforcement due to having odd and bizarre behaviors. Patient was recently in the ER due to similar presentations. Her medications were restarted and she was able to stabilize and discharge. However, with this ER visit patient condition has worsened. She hasn't had medications for several days. She's currently manic, thoughts are disorganized and tangential speech. Patient is well known to the ER due to similar presentations.  On admission to the ED: 43 y.o. female with below list of previous medical conditions including polysubstance abuse bipolar disorder presents to the emergency department in police custody with very bizarre behavior patient very labile.  Patient patient was not on the patient's room picking up a chair to throw stating "I do not know what F... you monsters crazy people, no school and just working out".  On arrival to the emergency department patient states that she "snorted some kind of drug today"  Past Psychiatric History: bipolar disorder, substance abuse  Risk to  Self:  yes Risk to Others:  no Prior Inpatient Therapy:   Prior Outpatient Therapy:    Past Medical History:  Past Medical History:  Diagnosis Date  . Allergy    Seasonal, Ultram (seizures)  . Anemia 2005  . Bipolar 1 disorder ([place])   . Glaucoma   . Seizures ([place]) 2008    Past Surgical History:  Procedure Laterality Date  . BREAST BIOPSY Right    x 2  . CESAREAN SECTION     x 2   Family History:  Family History  Problem Relation Age of Onset  . Hypertension Mother   . Hyperlipidemia Mother   . Parkinson's disease Father    Family Psychiatric  History: see above Social History:  Social History   Substance and Sexual Activity  Alcohol Use Yes  . Alcohol/week: 4.0 standard drinks  . Types: 4 Cans of beer per week   Comment: Ocassional     Social History   Substance and Sexual Activity  Drug Use Yes  . Types: Marijuana   Comment: percocet -last use over 1 week ago. Heroin-last used 2/11    Social History   Socioeconomic History  . Marital status: Single    Spouse name: Not on file  . Number of children: Not on file  . Years of education: Not on file  . Highest education level: Not on file  Occupational History  . Not on file  Social Needs  . Financial resource strain: Not on file  . Food insecurity    Worry: Not on file    Inability: Not on file  . Transportation needs  Medical: Not on file    Non-medical: Not on file  Tobacco Use  . Smoking status: Current Every Day Smoker    Packs/day: 1.00    Years: 9.00    Pack years: 9.00  . Smokeless tobacco: Never Used  . Tobacco comment: Patient not interested  Substance and Sexual Activity  . Alcohol use: Yes    Alcohol/week: 4.0 standard drinks    Types: 4 Cans of beer per week    Comment: Ocassional  . Drug use: Yes    Types: Marijuana    Comment: percocet -last use over 1 week ago. Heroin-last used 2/11  . Sexual activity: Yes    Birth control/protection: None  Lifestyle  . Physical activity     Days per week: Not on file    Minutes per session: Not on file  . Stress: Not on file  Relationships  . Social Herbalist on phone: Not on file    Gets together: Not on file    Attends religious service: Not on file    Active member of club or organization: Not on file    Attends meetings of clubs or organizations: Not on file    Relationship status: Not on file  Other Topics Concern  . Not on file  Social History Narrative  . Not on file   Additional Social History:    Allergies:   Allergies  Allergen Reactions  . Haldol [Haloperidol Lactate]     Patient states that she gets stiff muscles when taking Haldol  . Ultram [Tramadol] Other (See Comments)    Seizures    Labs:  Results for orders placed or performed during the hospital encounter of 05/25/19 (from the past 48 hour(s))  Comprehensive metabolic panel     Status: Abnormal   Collection Time: 05/25/19  5:28 AM  Result Value Ref Range   Sodium 136 135 - 145 mmol/L   Potassium 3.8 3.5 - 5.1 mmol/L   Chloride 104 98 - 111 mmol/L   CO2 24 22 - 32 mmol/L   Glucose, Bld 120 (H) 70 - 99 mg/dL   BUN 10 6 - 20 mg/dL   Creatinine, Ser 0.67 0.44 - 1.00 mg/dL   Calcium 9.5 8.9 - 10.3 mg/dL   Total Protein 6.8 6.5 - 8.1 g/dL   Albumin 4.1 3.5 - 5.0 g/dL   AST 9 (L) 15 - 41 U/L   ALT 11 0 - 44 U/L   Alkaline Phosphatase 56 38 - 126 U/L   Total Bilirubin 0.5 0.3 - 1.2 mg/dL   GFR calc non Af Amer >60 >60 mL/min   GFR calc Af Amer >60 >60 mL/min   Anion gap 8 5 - 15    Comment: Performed at [facility], [address], [place], [place] [address]  Ethanol     Status: None   Collection Time: 05/25/19  5:28 AM  Result Value Ref Range   Alcohol, Ethyl (B) <10 <10 mg/dL    Comment: (NOTE) Lowest detectable limit for serum alcohol is 10 mg/dL. For medical purposes only. Performed at [facility], [address] [place], [place] [address]   Salicylate level     Status: None   Collection  Time: 05/25/19  5:28 AM  Result Value Ref Range   Salicylate Lvl <3.5 2.8 - 30.0 mg/dL    Comment: Performed at [facility], [address] [place], [place] [address]  Acetaminophen level     Status: Abnormal  Collection Time: 05/25/19  5:28 AM  Result Value Ref Range   Acetaminophen (Tylenol), Serum <10 (L) 10 - 30 ug/mL    Comment: (NOTE) Therapeutic concentrations vary significantly. A range of 10-30 ug/mL  may be an effective concentration for many patients. However, some  are best treated at concentrations outside of this range. Acetaminophen concentrations >150 ug/mL at 4 hours after ingestion  and >50 ug/mL at 12 hours after ingestion are often associated with  toxic reactions. Performed at [facility], [address] [place], [place] [address]   cbc     Status: Abnormal   Collection Time: 05/25/19  5:28 AM  Result Value Ref Range   WBC 15.1 (H) 4.0 - 10.5 K/uL   RBC 4.26 3.87 - 5.11 MIL/uL   Hemoglobin 13.3 12.0 - 15.0 g/dL   HCT 39.4 36.0 - 46.0 %   MCV 92.5 80.0 - 100.0 fL   MCH 31.2 26.0 - 34.0 pg   MCHC 33.8 30.0 - 36.0 g/dL   RDW 12.3 11.5 - 15.5 %   Platelets 278 150 - 400 K/uL   nRBC 0.0 0.0 - 0.2 %    Comment: Performed at [facility], [address] [place], [place] [address]  Urine Drug Screen, Qualitative     Status: None   Collection Time: 05/25/19  5:28 AM  Result Value Ref Range   Tricyclic, Ur Screen NONE DETECTED NONE DETECTED   Amphetamines, Ur Screen NONE DETECTED NONE DETECTED   MDMA (Ecstasy)Ur Screen NONE DETECTED NONE DETECTED   Cocaine Metabolite,Ur [place] NONE DETECTED NONE DETECTED   Opiate, Ur Screen NONE DETECTED NONE DETECTED   Phencyclidine (PCP) Ur S NONE DETECTED NONE DETECTED   Cannabinoid 50 Ng, Ur [place] NONE DETECTED NONE DETECTED   Barbiturates, Ur Screen NONE DETECTED NONE DETECTED   Benzodiazepine, Ur Scrn NONE DETECTED NONE DETECTED   Methadone Scn, Ur NONE DETECTED NONE DETECTED    Comment:  (NOTE) Tricyclics + metabolites, urine    Cutoff 1000 ng/mL Amphetamines + metabolites, urine  Cutoff 1000 ng/mL MDMA (Ecstasy), urine              Cutoff 500 ng/mL Cocaine Metabolite, urine          Cutoff 300 ng/mL Opiate + metabolites, urine        Cutoff 300 ng/mL Phencyclidine (PCP), urine         Cutoff 25 ng/mL Cannabinoid, urine                 Cutoff 50 ng/mL Barbiturates + metabolites, urine  Cutoff 200 ng/mL Benzodiazepine, urine              Cutoff 200 ng/mL Methadone, urine                   Cutoff 300 ng/mL The urine drug screen provides only a preliminary, unconfirmed analytical test result and should not be used for non-medical purposes. Clinical consideration and professional judgment should be applied to any positive drug screen result due to possible interfering substances. A more specific alternate chemical method must be used in order to obtain a confirmed analytical result. Gas chromatography / mass spectrometry (GC/MS) is the preferred confirmat ory method. Performed at [facility], [address] [place],  [address]   Pregnancy, urine POC     Status: None   Collection Time: 05/25/19  5:46 AM  Result Value Ref Range   Preg Test, Ur NEGATIVE NEGATIVE  Comment:        THE SENSITIVITY OF THIS METHODOLOGY IS >24 mIU/mL     No current facility-administered medications for this encounter.    Current Outpatient Medications  Medication Sig Dispense Refill  . buPROPion (WELLBUTRIN XL) 150 MG 24 hr tablet Take 1 tablet (150 mg total) by mouth daily. 30 tablet 1  . divalproex (DEPAKOTE ER) 500 MG 24 hr tablet Take 2 tablets (1,000 mg total) by mouth at bedtime. 60 tablet 1  . lithium carbonate (ESKALITH) 450 MG CR tablet Take 1 tablet (450 mg total) by mouth every 12 (twelve) hours. 60 tablet 1  . Norgestimate-Ethinyl Estradiol Triphasic (ORTHO TRI-CYCLEN, 28,) 0.18/0.215/0.25 MG-35 MCG tablet Take 1 tablet by mouth daily.    . traZODone  (DESYREL) 100 MG tablet Take 2 tablets (200 mg total) by mouth at bedtime. 60 tablet 1    Musculoskeletal: Strength & Muscle Tone: within normal limits Gait & Station: normal Patient leans: N/A  Psychiatric Specialty Exam: Physical Exam  Nursing note and vitals reviewed. Constitutional: She appears well-developed and well-nourished.  HENT:  Head: Normocephalic.  Neck: Normal range of motion.  Respiratory: Effort normal.  Musculoskeletal: Normal range of motion.  Neurological: She is alert.  Psychiatric: Her mood appears anxious. Her speech is tangential and slurred. Thought content is delusional. Cognition and memory are impaired. She expresses impulsivity and inappropriate judgment. She is inattentive.    Review of Systems  Psychiatric/Behavioral: The patient is nervous/anxious.   All other systems reviewed and are negative.   Blood pressure (!) 123/96, pulse (!) 104, temperature 98.1 F (36.7 C), resp. rate 18, weight 52.2 kg, SpO2 96 %.Body mass index is 21.05 kg/m.  General Appearance: Disheveled  Eye Contact:  Poor  Speech:  Slow and slurred  Volume:  Normal  Mood:  Anxious  Affect:  Congruent  Thought Process:  Disorganized, tangential  Orientation:  Full (Time, Place, and Person)  Thought Content:  Difficult to assess due to her current confusion  Suicidal Thoughts:  No  Homicidal Thoughts:  No  Memory:  Poor  Judgement:  Poor  Insight:  Fair  Psychomotor Activity:  Normal  Concentration:  Poor  Recall:  Poor  Fund of Knowledge:  Fair  Language:  Poor  Akathisia:  No  Handed:  Right  AIMS (if indicated):     Assets:  Housing Leisure Time Physical Health Resilience Social Support  ADL's:  Intact  Cognition:  Impaired, moderate  Sleep:        Treatment Plan Summary: Daily contact with patient to assess and evaluate symptoms and progress in treatment, Medication management and Plan bipolar affective disorder, mania:  -Changed Depakote 1000 mg at  bedtime to 500 mg BID -Restarted Lithium 450 mg BID -Restarted Wellbutrin 150 mg daily -Admit to [place] [facility]  Birth control: -Birth control pills restarted  Insomnia: -Restarted Trazodone 200 mg at bedtime  Disposition: Admit to [place], NP 05/25/2019 11:08 AM

## 2019-05-25 NOTE — ED Notes (Signed)
Walked swab to lab.

## 2019-05-25 NOTE — ED Triage Notes (Signed)
Patient coming in with [employer] officer. Patient with pressured speech. Patient stated she had to pee at stat desk, pulled pants down and attempted to urinate on floor. Patient called [employer] and reported that someone was after her. Patient reports that "people were morphing into other people", and trying to hurt her.   Patient reports that she snorted a drug earlier today.

## 2019-05-25 NOTE — BH Assessment (Signed)
Assessment Note  Autumn [name] is an 43 y.o. female Who presents to the ER via law enforcement due to having odd and bizarre behaviors. Patient was recently in the ER due to similar presentations. Her medications were restarted and she was able to stabilize and discharge. However, with this ER visit patient condition has worsened. She hasn't had medications for several days. She's currently manic, thoughts are disorganized and tangential speech. Patient is well known to the ER due to similar presentations.  During the interview, it was difficult to engage with the patient. Majority of the information was gathered from ER notes.  Diagnosis: Bipolar  Past Medical History:  Past Medical History:  Diagnosis Date  . Allergy    Seasonal, Ultram (seizures)  . Anemia 2005  . Bipolar 1 disorder ([place])   . Glaucoma   . Seizures ([place]) 2008    Past Surgical History:  Procedure Laterality Date  . BREAST BIOPSY Right    x 2  . CESAREAN SECTION     x 2    Family History:  Family History  Problem Relation Age of Onset  . Hypertension Mother   . Hyperlipidemia Mother   . Parkinson's disease Father     Social History:  reports that she has been smoking. She has a 9.00 pack-year smoking history. She has never used smokeless tobacco. She reports current alcohol use of about 4.0 standard drinks of alcohol per week. She reports current drug use. Drug: Marijuana.  Additional Social History:  Alcohol / Drug Use Pain Medications: See PTA Prescriptions: See PTA Over the Counter: See PTA History of alcohol / drug use?: (UTA)  CIWA: CIWA-Ar BP: (!) 123/96 Pulse Rate: (!) 104 COWS:    Allergies:  Allergies  Allergen Reactions  . Haldol [Haloperidol Lactate]     Patient states that she gets stiff muscles when taking Haldol  . Ultram [Tramadol] Other (See Comments)    Seizures    Home Medications: (Not in a hospital admission)   OB/GYN Status:  No LMP recorded.  General Assessment  Data Location of Assessment: [facility] ED TTS Assessment: In system Is this a Tele or Face-to-Face Assessment?: Face-to-Face Is this an Initial Assessment or a Re-assessment for this encounter?: Initial Assessment Patient Accompanied by:: N/A Language Other than English: No Living Arrangements: Homeless/Shelter What gender do you identify as?: Female Marital status: Single Pregnancy Status: No Living Arrangements: Other (Comment)(Homeless) Can pt return to current living arrangement?: Yes Admission Status: Involuntary Petitioner: ED Attending Is patient capable of signing voluntary admission?: No(Under IVC) Referral Source: Self/Family/Friend Insurance type: None  Medical Screening Exam ([place]) Medical Exam completed: Yes  Crisis Care Plan Living Arrangements: Other (Comment)(Homeless) Legal Guardian: Other:(Self) Name of Psychiatrist: Reports of none Name of Therapist: Reports of none  Education Status Is patient currently in school?: No Is the patient employed, unemployed or receiving disability?: Unemployed  Risk to self with the past 6 months Suicidal Ideation: No Has patient been a risk to self within the past 6 months prior to admission? : No Suicidal Intent: No Has patient had any suicidal intent within the past 6 months prior to admission? : No Is patient at risk for suicide?: No Suicidal Plan?: No Has patient had any suicidal plan within the past 6 months prior to admission? : Yes Access to Means: No What has been your use of drugs/alcohol within the last 12 months?: History of cannabis use Previous Attempts/Gestures: No How many times?: 0 Other Self Harm Risks:  Reports of none Triggers for Past Attempts: None known Intentional Self Injurious Behavior: None Family Suicide History: Unknown Recent stressful life event(s): Other (Comment), Conflict (Comment) Persecutory voices/beliefs?: No Depression: Yes Depression Symptoms: Tearfulness, Isolating,  Guilt, Feeling worthless/self pity, Feeling angry/irritable Substance abuse history and/or treatment for substance abuse?: Yes Suicide prevention information given to non-admitted patients: Not applicable  Risk to Others within the past 6 months Homicidal Ideation: No Does patient have any lifetime risk of violence toward others beyond the six months prior to admission? : No Thoughts of Harm to Others: No Current Homicidal Intent: No Current Homicidal Plan: No Access to Homicidal Means: No Identified Victim: Reports of none History of harm to others?: No Assessment of Violence: None Noted Violent Behavior Description: Reports of none Does patient have access to weapons?: No Criminal Charges Pending?: No Does patient have a court date: No Is patient on probation?: No  Psychosis Hallucinations: None noted Delusions: None noted  Mental Status Report Appearance/Hygiene: Unremarkable, In scrubs Eye Contact: Poor Motor Activity: Freedom of movement, Unremarkable Speech: Pressured, Tangential Level of Consciousness: Drowsy Mood: Sad, Pleasant, Despair Affect: Appropriate to circumstance Anxiety Level: None Thought Processes: Thought Blocking, Relevant Judgement: Impaired Orientation: Person, Place Obsessive Compulsive Thoughts/Behaviors: Moderate  Cognitive Functioning Concentration: Decreased Memory: Recent Impaired, Remote Impaired Is patient IDD: No Insight: Poor Impulse Control: Poor Appetite: Good Have you had any weight changes? : No Change Sleep: Unable to Assess Vegetative Symptoms: None  ADLScreening [facility] Assessment Services) Patient's cognitive ability adequate to safely complete daily activities?: Yes Patient able to express need for assistance with ADLs?: Yes Independently performs ADLs?: Yes (appropriate for developmental age)  Prior Inpatient Therapy Prior Inpatient Therapy: Yes Prior Therapy Dates: Multiple Hospitalizations-11/2018, 08/2017, 06/2017,  05/2014(07/2013, 06/2012, 07/2010 & 07/2009) Prior Therapy Facilty/Provider(s): [facility] Reason for Treatment: Bipolar & Depression  Prior Outpatient Therapy Prior Outpatient Therapy: Yes Prior Therapy Dates: 2020 Prior Therapy Facilty/Provider(s): [facility] Reason for Treatment: Bipolar D/O Does patient have an ACCT team?: No Does patient have Intensive In-House Services?  : No Does patient have [facility] services? : No Does patient have P4CC services?: No  ADL Screening (condition at time of admission) Patient's cognitive ability adequate to safely complete daily activities?: Yes Is the patient deaf or have difficulty hearing?: No Does the patient have difficulty seeing, even when wearing glasses/contacts?: No Does the patient have difficulty concentrating, remembering, or making decisions?: No Patient able to express need for assistance with ADLs?: Yes Does the patient have difficulty dressing or bathing?: No Independently performs ADLs?: Yes (appropriate for developmental age) Does the patient have difficulty walking or climbing stairs?: No Weakness of Legs: None Weakness of Arms/Hands: None  Home Assistive Devices/Equipment Home Assistive Devices/Equipment: None  Therapy Consults (therapy consults require a physician order) PT Evaluation Needed: No OT Evalulation Needed: No SLP Evaluation Needed: No Abuse/Neglect Assessment (Assessment to be complete while patient is alone) Physical Abuse: Denies Verbal Abuse: Denies Sexual Abuse: Denies Exploitation of patient/patient's resources: Denies Self-Neglect: Denies Values / Beliefs Cultural Requests During Hospitalization: None Spiritual Requests During Hospitalization: None Consults Spiritual Care Consult Needed: No Social Work Consult Needed: No Regulatory affairs officer (For Healthcare) Does Patient Have a Medical Advance Directive?: No Would patient like information on creating a medical advance  directive?: No - Patient declined       Child/Adolescent Assessment Running Away Risk: Denies(Patient is an adult)  Disposition:  Disposition Initial Assessment Completed for this Encounter: Yes  On Site Evaluation by:   Reviewed  with Physician:    Gunnar Fusi MS, LCAS, [facility], [place], [place] Therapeutic Triage Specialist 05/25/2019 8:52 PM

## 2019-05-25 NOTE — ED Notes (Signed)
Patient changed into hospital provided scrubs by this RN and Myah NT. Patient's belonging's placed into labeled bag. Belonging's include:  Pink pants, 1 pair panties, 1 blue t-shirt,

## 2019-05-25 NOTE — Plan of Care (Signed)
Patient newly admitted, hasn't had time to progress.   Problem: Education: Goal: Knowledge of [place] [facility] Education information/materials will improve Outcome: Not Progressing Goal: Emotional status will improve Outcome: Not Progressing Goal: Mental status will improve Outcome: Not Progressing Goal: Verbalization of understanding the information provided will improve Outcome: Not Progressing   Problem: Safety: Goal: Periods of time without injury will increase Outcome: Not Progressing   Problem: Education: Goal: Will be free of psychotic symptoms Outcome: Not Progressing Goal: Knowledge of the prescribed therapeutic regimen will improve Outcome: Not Progressing   Problem: Safety: Goal: Ability to redirect hostility and anger into socially appropriate behaviors will improve Outcome: Not Progressing Goal: Ability to remain free from injury will improve Outcome: Not Progressing

## 2019-05-25 NOTE — BH Assessment (Signed)
Patient is to be admitted to [name] by Nurse Practitioner Waylan Boga Attending Physician will be Dr. Weber Cooks.   Patient has been assigned to room 324, by [place].   ER staff is aware of the admission:  Glenda, ER Secretary    Dr. Archie Balboa, ER MD   Donneta Romberg, Patient's Nurse   [place] Dragon. [facility] Patient Access.

## 2019-05-25 NOTE — ED Notes (Signed)
Pt in hallway walking back to room when she picked up chair and started raising it over her head. Chair taken away from pt. Pt redirected into RM 21.

## 2019-05-25 NOTE — ED Notes (Signed)
IVC/Psych consult pending

## 2019-05-25 NOTE — ED Notes (Signed)
Pt. Introduced to unit.  Pt. Advised of cameras and safety checks.  Pt. Requested and was given additional blanket.  Pt. Calm and cooperative at this time.

## 2019-05-25 NOTE — ED Notes (Signed)
Patient awake and given food tray. Patient said "thank you" for tray. Laying back in bed with eyes closed at this time.

## 2019-05-26 DIAGNOSIS — F3162 Bipolar disorder, current episode mixed, moderate: Secondary | ICD-10-CM

## 2019-05-26 NOTE — H&P (Addendum)
Psychiatric Admission Assessment Adult  Patient Identification: Autumn [name] MRN:  [id number] Date of Evaluation:  05/26/2019 Chief Complaint:  BIPOLAR Principal Diagnosis: <principal problem not specified> Diagnosis:  Active Problems:   Affective psychosis, bipolar [facility])  Autumn [name] is a 43yo F with past psych h/o bipolar disorder, substance use disorder, who was admitted yesterday from ED due to bizarre and disorganized behavior.  History of Present Illness:  Patient was brought to ER via law enforcement due to having odd and bizarre behaviors. She repotredly hasn't had medications for several days. In ER she reportedly presented manic with disorganized thoughts and tangential speech. Inpatient admission recommended. Upon admission patient`s psych medications were restarted.  On interview in the unit today, patient was in bed and appeared sedated. She reports "feeling better". She admits she was not always compliant with medications at home and using drugs - cannabis and "other". Ast the moment, she denies any mental complaints - denies feeling depressed, anxious, suicidal, homicidal, denies any hallucinations, does not express delusions. Although she denies feeling depressed at the moment, she reports occasional depressed mood at home and occasional self-harming behaviors, such as burning self with a cigarette; states she did it last week "when I asked a guy for a couple of dollars and did not get it, so I burned self do not get upset".   Total Time spent with patient: 30 minutes  Past Psychiatric History: past Dx: bipolar disorder. Several psych admissions. Denies suicidal attempts. Reports non-suicidal self-injurious behaviors. Questionable med-compliance.  Is the patient at risk to self? No.  Has the patient been a risk to self in the past 6 months? No.  Has the patient been a risk to self within the distant past? No.  Is the patient a risk to others? No.  Has the patient been a  risk to others in the past 6 months? No.  Has the patient been a risk to others within the distant past? No.   Prior Inpatient Therapy:   Prior Outpatient Therapy:    Alcohol Screening: 1. How often do you have a drink containing alcohol?: Never 2. How many drinks containing alcohol do you have on a typical day when you are drinking?: 1 or 2 3. How often do you have six or more drinks on one occasion?: Never AUDIT-C Score: 0 4. How often during the last year have you found that you were not able to stop drinking once you had started?: Never 5. How often during the last year have you failed to do what was normally expected from you becasue of drinking?: Never 6. How often during the last year have you needed a first drink in the morning to get yourself going after a heavy drinking session?: Never 7. How often during the last year have you had a feeling of guilt of remorse after drinking?: Never 8. How often during the last year have you been unable to remember what happened the night before because you had been drinking?: Never 9. Have you or someone else been injured as a result of your drinking?: No 10. Has a relative or friend or a doctor or another health worker been concerned about your drinking or suggested you cut down?: No Alcohol Use Disorder Identification Test Final Score (AUDIT): 0 Alcohol Brief Interventions/Follow-up: AUDIT Score <7 follow-up not indicated Substance Abuse History in the last 12 months:  Yes.   Consequences of Substance Abuse: Medical Consequences:  psych admission Legal Consequences:  unclear Previous Psychotropic Medications: Yes  Psychological Evaluations: Yes  Past Medical History:  Past Medical History:  Diagnosis Date  . Allergy    Seasonal, Ultram (seizures)  . Anemia 2005  . Bipolar 1 disorder ([place])   . Glaucoma   . Seizures ([place]) 2008    Past Surgical History:  Procedure Laterality Date  . BREAST BIOPSY Right    x 2  . CESAREAN SECTION      x 2   Family History:  Family History  Problem Relation Age of Onset  . Hypertension Mother   . Hyperlipidemia Mother   . Parkinson's disease Father    Family Psychiatric  History: denies h/o suicides  Tobacco Screening:   Social History:  Social History   Substance and Sexual Activity  Alcohol Use Yes  . Alcohol/week: 4.0 standard drinks  . Types: 4 Cans of beer per week   Comment: Ocassional     Social History   Substance and Sexual Activity  Drug Use Yes  . Types: Marijuana   Comment: percocet -last use over 1 week ago. Heroin-last used 2/11    Additional Social History:     Patient is homeless. Single. Unemployed.    Allergies:   Allergies  Allergen Reactions  . Haldol [Haloperidol Lactate]     Patient states that she gets stiff muscles when taking Haldol  . Ultram [Tramadol] Other (See Comments)    Seizures   Lab Results:  Results for orders placed or performed during the hospital encounter of 05/25/19 (from the past 48 hour(s))  Comprehensive metabolic panel     Status: Abnormal   Collection Time: 05/25/19  5:28 AM  Result Value Ref Range   Sodium 136 135 - 145 mmol/L   Potassium 3.8 3.5 - 5.1 mmol/L   Chloride 104 98 - 111 mmol/L   CO2 24 22 - 32 mmol/L   Glucose, Bld 120 (H) 70 - 99 mg/dL   BUN 10 6 - 20 mg/dL   Creatinine, Ser 0.67 0.44 - 1.00 mg/dL   Calcium 9.5 8.9 - 10.3 mg/dL   Total Protein 6.8 6.5 - 8.1 g/dL   Albumin 4.1 3.5 - 5.0 g/dL   AST 9 (L) 15 - 41 U/L   ALT 11 0 - 44 U/L   Alkaline Phosphatase 56 38 - 126 U/L   Total Bilirubin 0.5 0.3 - 1.2 mg/dL   GFR calc non Af Amer >60 >60 mL/min   GFR calc Af Amer >60 >60 mL/min   Anion gap 8 5 - 15    Comment: Performed at [facility], [address] [place], [place] [address]  Ethanol     Status: None   Collection Time: 05/25/19  5:28 AM  Result Value Ref Range   Alcohol, Ethyl (B) <10 <10 mg/dL    Comment: (NOTE) Lowest detectable limit for serum alcohol is 10 mg/dL. For  medical purposes only. Performed at [facility], [address] [place], [place] [address]   Salicylate level     Status: None   Collection Time: 05/25/19  5:28 AM  Result Value Ref Range   Salicylate Lvl <8.6 2.8 - 30.0 mg/dL    Comment: Performed at [facility], [address] [place], [place] [address]  Acetaminophen level     Status: Abnormal   Collection Time: 05/25/19  5:28 AM  Result Value Ref Range   Acetaminophen (Tylenol), Serum <10 (L) 10 - 30 ug/mL    Comment: (NOTE) Therapeutic concentrations vary significantly. A range of 10-30  ug/mL  may be an effective concentration for many patients. However, some  are best treated at concentrations outside of this range. Acetaminophen concentrations >150 ug/mL at 4 hours after ingestion  and >50 ug/mL at 12 hours after ingestion are often associated with  toxic reactions. Performed at [facility], [address] [place], [place] [address]   cbc     Status: Abnormal   Collection Time: 05/25/19  5:28 AM  Result Value Ref Range   WBC 15.1 (H) 4.0 - 10.5 K/uL   RBC 4.26 3.87 - 5.11 MIL/uL   Hemoglobin 13.3 12.0 - 15.0 g/dL   HCT 39.4 36.0 - 46.0 %   MCV 92.5 80.0 - 100.0 fL   MCH 31.2 26.0 - 34.0 pg   MCHC 33.8 30.0 - 36.0 g/dL   RDW 12.3 11.5 - 15.5 %   Platelets 278 150 - 400 K/uL   nRBC 0.0 0.0 - 0.2 %    Comment: Performed at [facility], [address], [place], [place] [address]  Urine Drug Screen, Qualitative     Status: None   Collection Time: 05/25/19  5:28 AM  Result Value Ref Range   Tricyclic, Ur Screen NONE DETECTED NONE DETECTED   Amphetamines, Ur Screen NONE DETECTED NONE DETECTED   MDMA (Ecstasy)Ur Screen NONE DETECTED NONE DETECTED   Cocaine Metabolite,Ur [place] NONE DETECTED NONE DETECTED   Opiate, Ur Screen NONE DETECTED NONE DETECTED   Phencyclidine (PCP) Ur S NONE DETECTED NONE DETECTED   Cannabinoid 50 Ng, Ur [place] NONE DETECTED NONE DETECTED   Barbiturates, Ur  Screen NONE DETECTED NONE DETECTED   Benzodiazepine, Ur Scrn NONE DETECTED NONE DETECTED   Methadone Scn, Ur NONE DETECTED NONE DETECTED    Comment: (NOTE) Tricyclics + metabolites, urine    Cutoff 1000 ng/mL Amphetamines + metabolites, urine  Cutoff 1000 ng/mL MDMA (Ecstasy), urine              Cutoff 500 ng/mL Cocaine Metabolite, urine          Cutoff 300 ng/mL Opiate + metabolites, urine        Cutoff 300 ng/mL Phencyclidine (PCP), urine         Cutoff 25 ng/mL Cannabinoid, urine                 Cutoff 50 ng/mL Barbiturates + metabolites, urine  Cutoff 200 ng/mL Benzodiazepine, urine              Cutoff 200 ng/mL Methadone, urine                   Cutoff 300 ng/mL The urine drug screen provides only a preliminary, unconfirmed analytical test result and should not be used for non-medical purposes. Clinical consideration and professional judgment should be applied to any positive drug screen result due to possible interfering substances. A more specific alternate chemical method must be used in order to obtain a confirmed analytical result. Gas chromatography / mass spectrometry (GC/Autumn) is the preferred confirmat ory method. Performed at [facility], [address] [place],  [address]   Pregnancy, urine POC     Status: None   Collection Time: 05/25/19  5:46 AM  Result Value Ref Range   Preg Test, Ur NEGATIVE NEGATIVE    Comment:        THE SENSITIVITY OF THIS METHODOLOGY IS >24 mIU/mL   SARS Coronavirus 2 (CEPHEID- Performed in [place] hospital lab), Hosp Order     Status:  None   Collection Time: 05/25/19  2:41 PM   Specimen: Nasopharyngeal Swab  Result Value Ref Range   SARS Coronavirus 2 NEGATIVE NEGATIVE    Comment: (NOTE) If result is NEGATIVE SARS-CoV-2 target nucleic acids are NOT DETECTED. The SARS-CoV-2 RNA is generally detectable in upper and lower  respiratory specimens during the acute phase of infection. The lowest  concentration of  SARS-CoV-2 viral copies this assay can detect is 250  copies / mL. A negative result does not preclude SARS-CoV-2 infection  and should not be used as the sole basis for treatment or other  patient management decisions.  A negative result may occur with  improper specimen collection / handling, submission of specimen other  than nasopharyngeal swab, presence of viral mutation(s) within the  areas targeted by this assay, and inadequate number of viral copies  (<250 copies / mL). A negative result must be combined with clinical  observations, patient history, and epidemiological information. If result is POSITIVE SARS-CoV-2 target nucleic acids are DETECTED. The SARS-CoV-2 RNA is generally detectable in upper and lower  respiratory specimens dur ing the acute phase of infection.  Positive  results are indicative of active infection with SARS-CoV-2.  Clinical  correlation with patient history and other diagnostic information is  necessary to determine patient infection status.  Positive results do  not rule out bacterial infection or co-infection with other viruses. If result is PRESUMPTIVE POSTIVE SARS-CoV-2 nucleic acids MAY BE PRESENT.   A presumptive positive result was obtained on the submitted specimen  and confirmed on repeat testing.  While 2019 novel coronavirus  (SARS-CoV-2) nucleic acids may be present in the submitted sample  additional confirmatory testing may be necessary for epidemiological  and / or clinical management purposes  to differentiate between  SARS-CoV-2 and other Sarbecovirus currently known to infect humans.  If clinically indicated additional testing with an alternate test  methodology [phone]) is advised. The SARS-CoV-2 RNA is generally  detectable in upper and lower respiratory sp ecimens during the acute  phase of infection. The expected result is Negative. Fact Sheet for Patients:  StrictlyIdeas.no Fact Sheet for Healthcare  Providers: BankingDealers.co.za This test is not yet approved or cleared by the [place] FDA and has been authorized for detection and/or diagnosis of SARS-CoV-2 by FDA under an Emergency Use Authorization (EUA).  This EUA will remain in effect (meaning this test can be used) for the duration of the COVID-19 declaration under Section 564(b)(1) of the Act, 21 U.S.C. section 360bbb-3(b)(1), unless the authorization is terminated or revoked sooner. Performed at [facility], [address] [place], [place] [address]     Blood Alcohol level:  Lab Results  Component Value Date   [facility] <10 05/25/2019   ETH <10 44/96/7591    Metabolic Disorder Labs:  Lab Results  Component Value Date   HGBA1C 5.0 11/21/2018   MPG 96.8 11/21/2018   MPG 93.93 04/13/2018   No results found for: PROLACTIN Lab Results  Component Value Date   CHOL 196 11/21/2018   TRIG 97 11/21/2018   HDL 53 11/21/2018   CHOLHDL 3.7 11/21/2018   VLDL 19 11/21/2018   LDLCALC 124 (H) 11/21/2018   LDLCALC 117 (H) 04/13/2018    Current Medications: Current Facility-Administered Medications  Medication Dose Route Frequency Provider Last Rate Last Dose  . acetaminophen (TYLENOL) tablet 650 mg  650 mg Oral Q6H PRN Patrecia Pour, NP   650 mg at 05/26/19 1533  . alum & mag hydroxide-simeth (  MAALOX/MYLANTA) 200-200-20 MG/5ML suspension 30 mL  30 mL Oral Q4H PRN Patrecia Pour, NP      . buPROPion (WELLBUTRIN XL) 24 hr tablet 150 mg  150 mg Oral Daily Patrecia Pour, NP   150 mg at 05/26/19 0919  . divalproex (DEPAKOTE) DR tablet 500 mg  500 mg Oral Q12H Patrecia Pour, NP   500 mg at 05/26/19 0920  . lithium carbonate (ESKALITH) CR tablet 450 mg  450 mg Oral Q12H Patrecia Pour, NP   450 mg at 05/26/19 0919  . magnesium hydroxide (MILK OF MAGNESIA) suspension 30 mL  30 mL Oral Daily PRN Patrecia Pour, NP      . traZODone (DESYREL) tablet 200 mg  200 mg Oral QHS Patrecia Pour, NP        PTA Medications: Medications Prior to Admission  Medication Sig Dispense Refill Last Dose  . buPROPion (WELLBUTRIN XL) 150 MG 24 hr tablet Take 1 tablet (150 mg total) by mouth daily. 30 tablet 1   . divalproex (DEPAKOTE ER) 500 MG 24 hr tablet Take 2 tablets (1,000 mg total) by mouth at bedtime. 60 tablet 1   . lithium carbonate (ESKALITH) 450 MG CR tablet Take 1 tablet (450 mg total) by mouth every 12 (twelve) hours. 60 tablet 1   . Norgestimate-Ethinyl Estradiol Triphasic (ORTHO TRI-CYCLEN, 28,) 0.18/0.215/0.25 MG-35 MCG tablet Take 1 tablet by mouth daily.     . traZODone (DESYREL) 100 MG tablet Take 2 tablets (200 mg total) by mouth at bedtime. 60 tablet 1     Musculoskeletal: Strength & Muscle Tone: within normal limits Gait & Station: normal Patient leans: N/A  Psychiatric Specialty Exam: Physical Exam  Constitutional: She appears well-developed.  HENT:  Head: Normocephalic and atraumatic.  Eyes: Pupils are equal, round, and reactive to light.  Neck: Normal range of motion.  Cardiovascular: Normal rate and regular rhythm.  Respiratory: Effort normal. No respiratory distress.  GI: Soft.  Musculoskeletal: Normal range of motion.  Neurological: She is alert.  Skin: Skin is warm.    Review of Systems  Constitutional: Negative for chills and fever.  HENT: Negative for hearing loss.   Eyes: Negative for blurred vision and pain.  Respiratory: Negative for cough and shortness of breath.   Cardiovascular: Negative for chest pain.  Gastrointestinal: Negative for diarrhea, nausea and vomiting.  Skin: Negative for rash.  Neurological: Negative for dizziness, seizures and loss of consciousness.  Psychiatric/Behavioral: Negative for suicidal ideas.    Blood pressure 95/64, pulse 93, temperature 98.4 F (36.9 C), temperature source Oral, resp. rate 18, height 5\' 2"  (1.575 m), weight 52.2 kg, SpO2 98 %.Body mass index is 21.05 kg/m.  General Appearance: Disheveled  Eye  Contact:  Fair  Speech:  Slow  Volume:  Decreased  Mood:  Euthymic  Affect:  Blunt  Thought Process:  Coherent  Orientation:  Other:  self, place  Thought Content:  Illogical  Suicidal Thoughts:  No  Homicidal Thoughts:  No  Memory:  Immediate;   Fair Recent;   Fair Remote;   Fair  Judgement:  Other:  questionable  Insight:  limited  Psychomotor Activity:  Decreased  Concentration:  Concentration: Fair and Attention Span: decreased  Recall:  [employer] of Knowledge:  Fair  Language:  Good  Akathisia:  No  Handed:  Right  AIMS (if indicated):     Assets:  Desire for Improvement  ADL's:  Intact  Cognition:  Impaired,  Mild  Sleep:  Number of Hours: 6.75    Treatment Plan Summary: Daily contact with patient to assess and evaluate symptoms and progress in treatment  Observation Level/Precautions:  15 minute checks  Laboratory:  N/A  Psychotherapy:    Medications:    Consultations:    Discharge Concerns:    Estimated LOS:  Other:     Physician Treatment Plan for Primary Diagnosis: <principal problem not specified> Long Term Goal(s): Improvement in symptoms so as ready for discharge  Short Term Goals: Ability to identify changes in lifestyle to reduce recurrence of condition will improve, Ability to verbalize feelings will improve, Ability to disclose and discuss suicidal ideas, Ability to demonstrate self-control will improve, Ability to identify and develop effective coping behaviors will improve, Ability to maintain clinical measurements within normal limits will improve, Compliance with prescribed medications will improve and Ability to identify triggers associated with substance abuse/mental health issues will improve  Physician Treatment Plan for Secondary Diagnosis: Active Problems:   Affective psychosis, bipolar ([place])  Long Term Goal(s): Improvement in symptoms so as ready for discharge  Short Term Goals: Ability to identify changes in lifestyle to reduce  recurrence of condition will improve, Ability to verbalize feelings will improve, Ability to disclose and discuss suicidal ideas, Ability to demonstrate self-control will improve, Ability to identify and develop effective coping behaviors will improve, Ability to maintain clinical measurements within normal limits will improve, Compliance with prescribed medications will improve and Ability to identify triggers associated with substance abuse/mental health issues will improve  I certify that inpatient services furnished can reasonably be expected to improve the patient's condition.    Autumn Kirkland is a 43yo F with past psych h/o bipolar disorder, substance use disorder, who was admitted yesterday from ED due to bizarre and disorganized behavior. It appears that recent regress in patient`s condition related to medication non-compliance and substance use. Psychiatric medications were restart upon admission and the patient seems to be improving.  Impression: Bipolar disorder, recent episode mixed. Substance use disorder.  -continue inpatient psych admission; 15-minute checks; daily contact with patient to assess and evaluate symptoms and progress in treatment; psychoeducation.   -continue scheduled psych medications: Depakote 500mg  PO BID for mood stabilization;  Lithium 450mg  PO BID for mood stabilization. Wellbutrin 150mg  PO daily for depression; Trazodone 200mg  PO QHS for sleep.  -VPA and Li level ordered for Monday.  -continue PRN medications.   -Disposition: to be determined by main treatment team.  Larita Fife, MD 7/18/20203:45 PM

## 2019-05-26 NOTE — [facility] Suicide Risk Assessment (Signed)
[facility] Admission Suicide Risk Assessment   Nursing information obtained from:  Patient Demographic factors:  NA Current Mental Status:  NA Loss Factors:  NA Historical Factors:  NA Risk Reduction Factors:  NA  Total Time spent with patient: 30 minutes Principal Problem: <principal problem not specified> Diagnosis:  Active Problems:   Affective psychosis, bipolar ([place])  Subjective Data:  Autumn [name] is a 43yo F with past psych h/o bipolar disorder, substance use disorder, who was admitted yesterday from ED due to bizarre and disorganized behavior.  Denies SI, HI.  Continued Clinical Symptoms:  Alcohol Use Disorder Identification Test Final Score (AUDIT): 0 The "Alcohol Use Disorders Identification Test", Guidelines for Use in Primary Care, Second Edition.  World Pharmacologist [facility]). Score between 0-7:  no or low risk or alcohol related problems. Score between 8-15:  moderate risk of alcohol related problems. Score between 16-19:  high risk of alcohol related problems. Score 20 or above:  warrants further diagnostic evaluation for alcohol dependence and treatment.   CLINICAL FACTORS:   Bipolar Disorder:   Mixed State    Psychiatric Specialty Exam: see H&P   COGNITIVE FEATURES THAT CONTRIBUTE TO RISK:  None    SUICIDE RISK:   Mild:  Suicidal ideation of limited frequency, intensity, duration, and specificity.  There are no identifiable plans, no associated intent, mild dysphoria and related symptoms, good self-control (both objective and subjective assessment), few other risk factors, and identifiable protective factors, including available and accessible social support.  PLAN OF CARE: continue inpatient psych admission.  PATIENT DENIES ACCESS TO GUNS.   I certify that inpatient services furnished can reasonably be expected to improve the patient's condition.   Larita Fife, MD 05/26/2019, 4:16 PM

## 2019-05-26 NOTE — Tx Team (Signed)
Initial Treatment Plan 05/26/2019 12:14 AM Autumn [name] [id number]:[id number]    PATIENT STRESSORS: Medication change or noncompliance Substance abuse   PATIENT STRENGTHS: Motivation for treatment/growth Supportive family/friends   PATIENT IDENTIFIED PROBLEMS: Acute psychosis  Depression  Anxiety                 DISCHARGE CRITERIA:  Improved stabilization in mood, thinking, and/or behavior Motivation to continue treatment in a less acute level of care  PRELIMINARY DISCHARGE PLAN: Outpatient therapy Return to previous living arrangement  PATIENT/FAMILY INVOLVEMENT: This treatment plan has been presented to and reviewed with the patient, Autumn [name]. The patient has been given the opportunity to ask questions and make suggestions.  Mallie Darting, RN 05/26/2019, 12:14 AM

## 2019-05-26 NOTE — Plan of Care (Signed)
  Problem: Education: Goal: Knowledge of [place] [facility] Education information/materials will improve Outcome: Not Progressing Goal: Emotional status will improve Outcome: Not Progressing Goal: Mental status will improve Outcome: Not Progressing Goal: Verbalization of understanding the information provided will improve Outcome: Not Progressing   Problem: Safety: Goal: Periods of time without injury will increase Outcome: Not Progressing   Problem: Education: Goal: Will be free of psychotic symptoms Outcome: Not Progressing Goal: Knowledge of the prescribed therapeutic regimen will improve Outcome: Not Progressing   Problem: Safety: Goal: Ability to redirect hostility and anger into socially appropriate behaviors will improve Outcome: Not Progressing Goal: Ability to remain free from injury will improve Outcome: Not Progressing

## 2019-05-26 NOTE — [facility] Group Notes (Signed)
LCSW Group Therapy Note   05/26/2019 1:15pm   Type of Therapy and Topic:  Group Therapy:  Trust and Honesty  Participation Level:  None  Description of Group:    In this group patients will be asked to explore the value of being honest.  Patients will be guided to discuss their thoughts, feelings, and behaviors related to honesty and trusting in others. Patients will process together how trust and honesty relate to forming relationships with peers, family members, and self. Each patient will be challenged to identify and express feelings of being vulnerable. Patients will discuss reasons why people are dishonest and identify alternative outcomes if one was truthful (to self or others). This group will be process-oriented, with patients participating in exploration of their own experiences, giving and receiving support, and processing challenge from other group members.   Therapeutic Goals: 1. Patient will identify why honesty is important to relationships and how honesty overall affects relationships.  2. Patient will identify a situation where they lied or were lied too and the  feelings, thought process, and behaviors surrounding the situation 3. Patient will identify the meaning of being vulnerable, how that feels, and how that correlates to being honest with self and others. 4. Patient will identify situations where they could have told the truth, but instead lied and explain reasons of dishonesty.   Summary of Patient ProgressThe patient reported that she feels "alright." The patient left after introducing herself to the group.    Therapeutic Modalities:   Cognitive Behavioral Therapy Solution Focused Therapy Motivational Interviewing Brief Therapy  Liberato Stansbery  CUEBAS-COLON, LCSW 05/26/2019 12:35 PM

## 2019-05-26 NOTE — Progress Notes (Signed)
D - Patient was admitted at 11:00 pm from the [facility] Emergency Department. Report received from [place], [place]. Patient was pleasant during assessment. Skin check completed with Pamala Hurry, RN. No abnormalities found or contraband on the patient. Patient denies SI/HI/AVH and pain. Patient rated her depression and anxiety 7/10 stating, "I have depression and anxiety because I am down here." Patient was oriented to the unit and her room.   A - Patient didn't have any medication scheduled tonight. Patient was compliant with procedures on the unit. Patient given education. Patient given support and encouragement to be active in her treatment plan. Patient informed to let staff know if there are any issues or problems on the unit.   R - Patient being monitored Q 15 minutes for safety per unit protocol. Patient remains safe on the unit.

## 2019-05-26 NOTE — Progress Notes (Signed)
D: pt resting in room today. Pt denies any needs or concerns. Pt is isolative. Pt had to be encouraged to get up for lunch. No distress noted. No complaints. No SI, HI, AVH A: Pt medicated as reflected in MAR. Safety checks performed every 15 minutes.  R: Safety maintained.

## 2019-05-27 DIAGNOSIS — F3162 Bipolar disorder, current episode mixed, moderate: Secondary | ICD-10-CM

## 2019-05-27 NOTE — Progress Notes (Signed)
Patient was mostly in room. Out of room for meals. Calm and cooperative but appears to be tired. Had no major concern.

## 2019-05-27 NOTE — Progress Notes (Signed)
[facility] MD Progress Note  05/27/2019 1:28 PM Autumn [name]  MRN:  [id number]   Autumn [name] is a 43yo F with past psych h/o bipolar disorder, substance use disorder, who was admitted  from ED due to bizarre and disorganized behavior. It appears that recent regress in patient`s condition related to medication non-compliance and substance use. Psychiatric medications were restart upon admission.  Subjective:   "I am very sad today because I have no one in my life. My mother does not want to deal with me anymore. My boyfriend broke up with me and asked me not to bother him anymore. My kids are in custody of other people". She reports depressed mood, but denies thoughts or urges to harm self or others. She reports concerns that she might catch covid-19 due to homelessness; she notified that her result came back negative, patient expressed understanding. No new symptoms. No side effects from medications. She is willing to get help for substance use.   Principal Problem: <principal problem not specified> Diagnosis: Active Problems:   Affective psychosis, bipolar ([place])  Total Time spent with patient: 15 minutes  Past Psychiatric History: see H&P  Past Medical History:  Past Medical History:  Diagnosis Date  . Allergy    Seasonal, Ultram (seizures)  . Anemia 2005  . Bipolar 1 disorder ([place])   . Glaucoma   . Seizures ([place]) 2008    Past Surgical History:  Procedure Laterality Date  . BREAST BIOPSY Right    x 2  . CESAREAN SECTION     x 2   Family History:  Family History  Problem Relation Age of Onset  . Hypertension Mother   . Hyperlipidemia Mother   . Parkinson's disease Father    Family Psychiatric  History: see H&P Social History:  Social History   Substance and Sexual Activity  Alcohol Use Yes  . Alcohol/week: 4.0 standard drinks  . Types: 4 Cans of beer per week   Comment: Ocassional     Social History   Substance and Sexual Activity  Drug Use Yes  . Types: Marijuana    Comment: percocet -last use over 1 week ago. Heroin-last used 2/11    Social History   Socioeconomic History  . Marital status: Single    Spouse name: Not on file  . Number of children: Not on file  . Years of education: Not on file  . Highest education level: Not on file  Occupational History  . Not on file  Social Needs  . Financial resource strain: Not on file  . Food insecurity    Worry: Not on file    Inability: Not on file  . Transportation needs    Medical: Not on file    Non-medical: Not on file  Tobacco Use  . Smoking status: Current Every Day Smoker    Packs/day: 1.00    Years: 9.00    Pack years: 9.00  . Smokeless tobacco: Never Used  . Tobacco comment: Patient not interested  Substance and Sexual Activity  . Alcohol use: Yes    Alcohol/week: 4.0 standard drinks    Types: 4 Cans of beer per week    Comment: Ocassional  . Drug use: Yes    Types: Marijuana    Comment: percocet -last use over 1 week ago. Heroin-last used 2/11  . Sexual activity: Yes    Birth control/protection: None  Lifestyle  . Physical activity    Days per week: Not on file    Minutes  per session: Not on file  . Stress: Not on file  Relationships  . Social Herbalist on phone: Not on file    Gets together: Not on file    Attends religious service: Not on file    Active member of club or organization: Not on file    Attends meetings of clubs or organizations: Not on file    Relationship status: Not on file  Other Topics Concern  . Not on file  Social History Narrative  . Not on file   Additional Social History:                         Sleep: Good  Appetite:  Fair  Current Medications: Current Facility-Administered Medications  Medication Dose Route Frequency Provider Last Rate Last Dose  . acetaminophen (TYLENOL) tablet 650 mg  650 mg Oral Q6H PRN Patrecia Pour, NP   650 mg at 05/26/19 1533  . alum & mag hydroxide-simeth (MAALOX/MYLANTA) 200-200-20  MG/5ML suspension 30 mL  30 mL Oral Q4H PRN Patrecia Pour, NP      . buPROPion (WELLBUTRIN XL) 24 hr tablet 150 mg  150 mg Oral Daily Patrecia Pour, NP   150 mg at 05/27/19 0857  . divalproex (DEPAKOTE) DR tablet 500 mg  500 mg Oral Q12H Patrecia Pour, NP   500 mg at 05/27/19 0857  . lithium carbonate (ESKALITH) CR tablet 450 mg  450 mg Oral Q12H Patrecia Pour, NP   450 mg at 05/27/19 0857  . magnesium hydroxide (MILK OF MAGNESIA) suspension 30 mL  30 mL Oral Daily PRN Patrecia Pour, NP      . traZODone (DESYREL) tablet 200 mg  200 mg Oral QHS Patrecia Pour, NP   200 mg at 05/26/19 2133    Lab Results:  Results for orders placed or performed during the hospital encounter of 05/25/19 (from the past 48 hour(s))  SARS Coronavirus 2 (CEPHEID- Performed in [facility] hospital lab), Hosp Order     Status: None   Collection Time: 05/25/19  2:41 PM   Specimen: Nasopharyngeal Swab  Result Value Ref Range   SARS Coronavirus 2 NEGATIVE NEGATIVE    Comment: (NOTE) If result is NEGATIVE SARS-CoV-2 target nucleic acids are NOT DETECTED. The SARS-CoV-2 RNA is generally detectable in upper and lower  respiratory specimens during the acute phase of infection. The lowest  concentration of SARS-CoV-2 viral copies this assay can detect is 250  copies / mL. A negative result does not preclude SARS-CoV-2 infection  and should not be used as the sole basis for treatment or other  patient management decisions.  A negative result may occur with  improper specimen collection / handling, submission of specimen other  than nasopharyngeal swab, presence of viral mutation(s) within the  areas targeted by this assay, and inadequate number of viral copies  (<250 copies / mL). A negative result must be combined with clinical  observations, patient history, and epidemiological information. If result is POSITIVE SARS-CoV-2 target nucleic acids are DETECTED. The SARS-CoV-2 RNA is generally detectable in  upper and lower  respiratory specimens dur ing the acute phase of infection.  Positive  results are indicative of active infection with SARS-CoV-2.  Clinical  correlation with patient history and other diagnostic information is  necessary to determine patient infection status.  Positive results do  not rule out bacterial infection or co-infection with other viruses. If result  is PRESUMPTIVE POSTIVE SARS-CoV-2 nucleic acids MAY BE PRESENT.   A presumptive positive result was obtained on the submitted specimen  and confirmed on repeat testing.  While 2019 novel coronavirus  (SARS-CoV-2) nucleic acids may be present in the submitted sample  additional confirmatory testing may be necessary for epidemiological  and / or clinical management purposes  to differentiate between  SARS-CoV-2 and other Sarbecovirus currently known to infect humans.  If clinically indicated additional testing with an alternate test  methodology [phone]) is advised. The SARS-CoV-2 RNA is generally  detectable in upper and lower respiratory sp ecimens during the acute  phase of infection. The expected result is Negative. Fact Sheet for Patients:  StrictlyIdeas.no Fact Sheet for Healthcare Providers: BankingDealers.co.za This test is not yet approved or cleared by the [place] FDA and has been authorized for detection and/or diagnosis of SARS-CoV-2 by FDA under an Emergency Use Authorization (EUA).  This EUA will remain in effect (meaning this test can be used) for the duration of the COVID-19 declaration under Section 564(b)(1) of the Act, 21 U.S.C. section 360bbb-3(b)(1), unless the authorization is terminated or revoked sooner. Performed at [facility], [address] [place], [place] [address]     Blood Alcohol level:  Lab Results  Component Value Date   [facility] <10 05/25/2019   ETH <10 81/19/1478    Metabolic Disorder Labs: Lab Results   Component Value Date   HGBA1C 5.0 11/21/2018   MPG 96.8 11/21/2018   MPG 93.93 04/13/2018   No results found for: PROLACTIN Lab Results  Component Value Date   CHOL 196 11/21/2018   TRIG 97 11/21/2018   HDL 53 11/21/2018   CHOLHDL 3.7 11/21/2018   VLDL 19 11/21/2018   LDLCALC 124 (H) 11/21/2018   LDLCALC 117 (H) 04/13/2018    Physical Findings: AIMS:  , ,  ,  ,    CIWA:    COWS:     Musculoskeletal: Strength & Muscle Tone: within normal limits Gait & Station: normal Patient leans: N/A  Psychiatric Specialty Exam: Physical Exam  ROS  Blood pressure 101/70, pulse 100, temperature 99 F (37.2 C), temperature source Oral, resp. rate 17, height 5\' 2"  (1.575 m), weight 52.2 kg, SpO2 99 %.Body mass index is 21.05 kg/m.  General Appearance: Casual  Eye Contact:  Minimal  Speech:  Normal Rate  Volume:  Decreased  Mood:  Anxious, Depressed and Dysphoric  Affect:  Blunt  Thought Process:  Coherent and Goal Directed  Orientation:  Full (Time, Place, and Person)  Thought Content:  Logical  Suicidal Thoughts:  No  Homicidal Thoughts:  No  Memory:  Immediate;   Fair Recent;   Fair Remote;   Fair  Judgement:  Fair  Insight:  Fair  Psychomotor Activity:  Decreased  Concentration:  Concentration: Fair and Attention Span: Fair  Recall:  [employer] of Knowledge:  Fair  Language:  Good  Akathisia:  No  Handed:  Right  AIMS (if indicated):     Assets:  Desire for Improvement Physical Health  ADL's:  Intact  Cognition:  Impaired,  Mild  Sleep:  Number of Hours: 6.75     Treatment Plan Summary: Daily contact with patient to assess and evaluate symptoms and progress in treatment   Autumn [name] is a 43yo F with past psych h/o bipolar disorder, substance use disorder, who was admitted from ED due to bizarre and disorganized behavior. It appears that recent regress in patient`s condition related to medication  non-compliance and substance use. Psychiatric medications were  restarted upon admission and the patient seems to be slowly improving - she is calm, able to participate in meaningful conversation and express her feelings; she denies SI, HI.  Impression: Bipolar disorder, recent episode mixed. Substance use disorder.  -continue inpatient psych admission; 15-minute checks; daily contact with patient to assess and evaluate symptoms and progress in treatment; psychoeducation.  -continue scheduled psych medications: Depakote 500mg  PO BID for mood stabilization;  Lithium 450mg  PO BID for mood stabilization. Wellbutrin 150mg  PO daily for depression; Trazodone 200mg  PO QHS for sleep.  -VPA and Li level ordered for Monday.  -continue PRN medications.  -Disposition: to be determined by main treatment team.  Larita Fife, MD 05/27/2019, 1:28 PM
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
## 2019-05-27 NOTE — [facility] Group Notes (Signed)
LCSW Group Therapy Note 05/27/2019 1:15pm  Type of Therapy and Topic: Group Therapy: Feelings Around Returning Home & Establishing a Supportive Framework and Supporting Oneself When Supports Not Available  Participation Level: Active  Description of Group:  Patients first processed thoughts and feelings about upcoming discharge. These included fears of upcoming changes, lack of change, new living environments, judgements and expectations from others and overall stigma of mental health issues. The group then discussed the definition of a supportive framework, what that looks and feels like, and how do to discern it from an unhealthy non-supportive network. The group identified different types of supports as well as what to do when your family/friends are less than helpful or unavailable  Therapeutic Goals  1. Patient will identify one healthy supportive network that they can use at discharge. 2. Patient will identify one factor of a supportive framework and how to tell it from an unhealthy network. 3. Patient able to identify one coping skill to use when they do not have positive supports from others. 4. Patient will demonstrate ability to communicate their needs through discussion and/or role plays.  Summary of Patient Progress:  The patient reported she feels "okay." Pt engaged during group session. As patients processed their anxiety about discharge and described healthy supports patient shared she is not ready to be discharge. Patient stated< "I am still confused." Patients identified at least one self-care tool they were willing to use after discharge.   Therapeutic Modalities Cognitive Behavioral Therapy Motivational Interviewing   [name]  CUEBAS-COLON, LCSW 05/27/2019 9:40 AM

## 2019-05-27 NOTE — [facility] Suicide Risk Assessment (Signed)
[facility] INPATIENT:  Family/Significant Other Suicide Prevention Education  Suicide Prevention Education:  Patient Refusal for Family/Significant Other Suicide Prevention Education: The patient Autumn [name] has refused to provide written consent for family/significant other to be provided Family/Significant Other Suicide Prevention Education during admission and/or prior to discharge.  Physician notified.  Tandra Rosado  CUEBAS-COLON 05/27/2019, 12:52 PM

## 2019-05-27 NOTE — Progress Notes (Signed)
D - Patient was in her room upon arrival to the unit. Patient was pleasant but isolative this evening. Patient was compliant with medication administration and procedures on the unit. Patient denies SI/HI/AVH, pain, anxiety and depression with this Probation officer.   A - Patient didn't have any medication scheduled tonight. Patient was compliant with procedures on the unit. Patient given education. Patient given support and encouragement to be active in her treatment plan. Patient informed to let staff know if there are any issues or problems on the unit.   R - Patient being monitored Q 15 minutes for safety per unit protocol. Patient remains safe on the unit.

## 2019-05-27 NOTE — Plan of Care (Signed)
Cooperative and pleasant upon approach. Mostly in bed but also was seen in the milieu. Up for meals. Denying thoughts of self harm. Denying hallucinations.  Reported that she needed to get some rest. No sign of distress. Was encouraged to talk to staff as needed. Safety monitored per unit protocol.

## 2019-05-27 NOTE — Plan of Care (Signed)
Patient newly admitted, to the unit and was isolative to the unit this evening.   Problem: Education: Goal: Knowledge of [facility] Education information/materials will improve Outcome: Not Progressing Goal: Emotional status will improve Outcome: Not Progressing Goal: Mental status will improve Outcome: Not Progressing Goal: Verbalization of understanding the information provided will improve Outcome: Not Progressing   Problem: Safety: Goal: Periods of time without injury will increase Outcome: Not Progressing   Problem: Education: Goal: Will be free of psychotic symptoms Outcome: Not Progressing Goal: Knowledge of the prescribed therapeutic regimen will improve Outcome: Not Progressing

## 2019-05-28 DIAGNOSIS — F1910 Other psychoactive substance abuse, uncomplicated: Secondary | ICD-10-CM

## 2019-05-28 DIAGNOSIS — F3112 Bipolar disorder, current episode manic without psychotic features, moderate: Secondary | ICD-10-CM

## 2019-05-28 LAB — VALPROIC ACID LEVEL: Valproic Acid Lvl: 92 ug/mL (ref 50.0–100.0)

## 2019-05-28 LAB — LITHIUM LEVEL: Lithium Lvl: 0.82 mmol/L (ref 0.60–1.20)

## 2019-05-28 MED ORDER — HYDROCORTISONE 0.5 % EX CREA
TOPICAL_CREAM | Freq: Three times a day (TID) | CUTANEOUS | Status: DC
  Administered 2019-05-28: 17:00:00 via TOPICAL
  Filled 2019-05-28: qty 28.35

## 2019-05-28 NOTE — Progress Notes (Addendum)
[facility] MD Progress Note  05/28/2019 6:42 PM Darianne Muralles  MRN:  [id number]   Ms Torry is a 43yo F with past psych h/o bipolar disorder, substance use disorder, who was admitted  from ED due to bizarre and disorganized behavior. It appears that recent regress in patient`s condition related to medication non-compliance and substance use. Psychiatric medications were restart upon admission. Subjective:   Patient continues to be depressed but some improvement noted.  Feels she has no social support and is currently homeless.  In the ED, she had a boyfriend last weekend.  She came to the ED initially last weekend with mania, medications restarted and she stabilized.  The boyfriend came and got her.  She came back a few days level and was hallucinating and acting bizarre, no positives on her UDS but she reported taking some pill.  No mania noted, sleep is good and appetite is improving.    Principal Problem: bipolar affective disorder, mixed, moderate Diagnosis: Active Problems:   Affective psychosis, bipolar ([place])  Total Time spent with patient: 15 minutes  Past Psychiatric History: see H&P  Past Medical History:  Past Medical History:  Diagnosis Date  . Allergy    Seasonal, Ultram (seizures)  . Anemia 2005  . Bipolar 1 disorder ([place])   . Glaucoma   . Seizures ([place]) 2008    Past Surgical History:  Procedure Laterality Date  . BREAST BIOPSY Right    x 2  . CESAREAN SECTION     x 2   Family History:  Family History  Problem Relation Age of Onset  . Hypertension Mother   . Hyperlipidemia Mother   . Parkinson's disease Father    Family Psychiatric  History: see H&P Social History:  Social History   Substance and Sexual Activity  Alcohol Use Yes  . Alcohol/week: 4.0 standard drinks  . Types: 4 Cans of beer per week   Comment: Ocassional     Social History   Substance and Sexual Activity  Drug Use Yes  . Types: Marijuana   Comment: percocet -last use over 1 week ago.  Heroin-last used 2/11    Social History   Socioeconomic History  . Marital status: Single    Spouse name: Not on file  . Number of children: Not on file  . Years of education: Not on file  . Highest education level: Not on file  Occupational History  . Not on file  Social Needs  . Financial resource strain: Not on file  . Food insecurity    Worry: Not on file    Inability: Not on file  . Transportation needs    Medical: Not on file    Non-medical: Not on file  Tobacco Use  . Smoking status: Current Every Day Smoker    Packs/day: 1.00    Years: 9.00    Pack years: 9.00  . Smokeless tobacco: Never Used  . Tobacco comment: Patient not interested  Substance and Sexual Activity  . Alcohol use: Yes    Alcohol/week: 4.0 standard drinks    Types: 4 Cans of beer per week    Comment: Ocassional  . Drug use: Yes    Types: Marijuana    Comment: percocet -last use over 1 week ago. Heroin-last used 2/11  . Sexual activity: Yes    Birth control/protection: None  Lifestyle  . Physical activity    Days per week: Not on file    Minutes per session: Not on file  . Stress:  Not on file  Relationships  . Social Herbalist on phone: Not on file    Gets together: Not on file    Attends religious service: Not on file    Active member of club or organization: Not on file    Attends meetings of clubs or organizations: Not on file    Relationship status: Not on file  Other Topics Concern  . Not on file  Social History Narrative  . Not on file   Additional Social History:                         Sleep: Good  Appetite:  Fair  Current Medications: Current Facility-Administered Medications  Medication Dose Route Frequency Provider Last Rate Last Dose  . acetaminophen (TYLENOL) tablet 650 mg  650 mg Oral Q6H PRN Patrecia Pour, NP   650 mg at 05/26/19 1533  . alum & mag hydroxide-simeth (MAALOX/MYLANTA) 200-200-20 MG/5ML suspension 30 mL  30 mL Oral Q4H PRN Patrecia Pour, NP      . buPROPion (WELLBUTRIN XL) 24 hr tablet 150 mg  150 mg Oral Daily Patrecia Pour, NP   150 mg at 05/28/19 1037  . divalproex (DEPAKOTE) DR tablet 500 mg  500 mg Oral Q12H Patrecia Pour, NP   500 mg at 05/28/19 1037  . hydrocortisone cream 0.5 %   Topical TID Patrecia Pour, NP      . lithium carbonate (ESKALITH) CR tablet 450 mg  450 mg Oral Q12H Patrecia Pour, NP   450 mg at 05/28/19 1037  . magnesium hydroxide (MILK OF MAGNESIA) suspension 30 mL  30 mL Oral Daily PRN Patrecia Pour, NP      . traZODone (DESYREL) tablet 200 mg  200 mg Oral QHS Patrecia Pour, NP   200 mg at 05/27/19 2147    Lab Results:  Results for orders placed or performed during the hospital encounter of 05/25/19 (from the past 48 hour(s))  Lithium level     Status: None   Collection Time: 05/28/19  6:38 AM  Result Value Ref Range   Lithium Lvl 0.82 0.60 - 1.20 mmol/L    Comment: Performed at [facility], [address] [place], [place] [address]  Valproic acid level     Status: None   Collection Time: 05/28/19  6:38 AM  Result Value Ref Range   Valproic Acid Lvl 92 50.0 - 100.0 ug/mL    Comment: Performed at [facility], [address] [place], [place] [address]    Blood Alcohol level:  Lab Results  Component Value Date   [facility] <10 05/25/2019   ETH <10 63/84/6659    Metabolic Disorder Labs: Lab Results  Component Value Date   HGBA1C 5.0 11/21/2018   MPG 96.8 11/21/2018   MPG 93.93 04/13/2018   No results found for: PROLACTIN Lab Results  Component Value Date   CHOL 196 11/21/2018   TRIG 97 11/21/2018   HDL 53 11/21/2018   CHOLHDL 3.7 11/21/2018   VLDL 19 11/21/2018   LDLCALC 124 (H) 11/21/2018   LDLCALC 117 (H) 04/13/2018    Musculoskeletal: Strength & Muscle Tone: within normal limits Gait & Station: normal Patient leans: N/A  Psychiatric Specialty Exam: Physical Exam  Nursing note and vitals reviewed. Constitutional: She is oriented to  person, place, and time. She appears well-developed and well-nourished.  HENT:  Head: Normocephalic.  Neck: Normal range  of motion.  Respiratory: Effort normal.  Musculoskeletal: Normal range of motion.  Neurological: She is alert and oriented to person, place, and time.  Psychiatric: Her speech is normal and behavior is normal. Thought content normal. Her mood appears anxious. Cognition and memory are normal. She expresses impulsivity. She exhibits a depressed mood.    Review of Systems  Psychiatric/Behavioral: Positive for depression. The patient is nervous/anxious.   All other systems reviewed and are negative.   Blood pressure 99/71, pulse 84, temperature 98.1 F (36.7 C), temperature source Oral, resp. rate 17, height 5\' 2"  (1.575 m), weight 52.2 kg, SpO2 99 %.Body mass index is 21.05 kg/m.  General Appearance: Casual  Eye Contact:  Minimal  Speech:  Normal Rate  Volume:  Decreased  Mood:  Anxious, Depressed and Dysphoric  Affect:  Blunt  Thought Process:  Coherent and Goal Directed  Orientation:  Full (Time, Place, and Person)  Thought Content:  Logical  Suicidal Thoughts:  No  Homicidal Thoughts:  No  Memory:  Immediate;   Fair Recent;   Fair Remote;   Fair  Judgement:  Fair  Insight:  Fair  Psychomotor Activity:  Decreased  Concentration:  Concentration: Fair and Attention Span: Fair  Recall:  [employer] of Knowledge:  Fair  Language:  Good  Akathisia:  No  Handed:  Right  AIMS (if indicated):     Assets:  Desire for Improvement Physical Health  ADL's:  Intact  Cognition:  Impaired,  Mild  Sleep:  Number of Hours: 7     Treatment Plan Summary: Daily contact with patient to assess and evaluate symptoms and progress in treatment   Ms Tramble is a 43yo F with past psych h/o bipolar disorder, substance use disorder, who was admitted from ED due to bizarre and disorganized behavior. It appears that recent regress in patient`s condition related to medication  non-compliance and substance use. Psychiatric medications were restarted upon admission and the patient seems to be slowly improving - she is calm, able to participate in meaningful conversation and express her feelings; she denies SI, HI.  Impression: Bipolar disorder, recent episode mixed. Substance use disorder.  -continue inpatient psych admission; 15-minute checks; daily contact with patient to assess and evaluate symptoms and progress in treatment; psychoeducation.  -continue scheduled psych medications: Depakote 500mg  PO BID for mood stabilization;  Lithium 450mg  PO BID for mood stabilization. Wellbutrin 150mg  PO daily for depression; Trazodone 200mg  PO QHS for sleep.  -VPA level of 92 -Lithium level of 0.82  -continue PRN medications.  -Disposition: to be determined by main treatment team.  Waylan Boga, NP 05/28/2019, 6:42 PM

## 2019-05-28 NOTE — Progress Notes (Signed)
Recreation Therapy Notes  Date: 05/28/2019  Time: 9:30 am   Location: Craft room   Behavioral response: N/A   Intervention Topic: Time Management   Discussion/Intervention: Patient did not attend group.   Clinical Observations/Feedback:  Patient did not attend group.   Tieara Flitton LRT/CTRS        Rainy Rothman 05/28/2019 11:25 AM

## 2019-05-28 NOTE — Progress Notes (Signed)
Patient got out of bed and stayed in the milieu. Had minimal interactions with peers but pleasant to staff upon approach. No major concerns throughout this shift.

## 2019-05-28 NOTE — Tx Team (Signed)
Interdisciplinary Treatment and Diagnostic Plan Update  05/28/2019 Time of Session: 10:30AM Autumn [name] MRN: [id number]  Principal Diagnosis: <principal problem not specified>  Secondary Diagnoses: Active Problems:   Affective psychosis, bipolar ([place])   Current Medications:  Current Facility-Administered Medications  Medication Dose Route Frequency Provider Last Rate Last Dose  . acetaminophen (TYLENOL) tablet 650 mg  650 mg Oral Q6H PRN Patrecia Pour, NP   650 mg at 05/26/19 1533  . alum & mag hydroxide-simeth (MAALOX/MYLANTA) 200-200-20 MG/5ML suspension 30 mL  30 mL Oral Q4H PRN Patrecia Pour, NP      . buPROPion (WELLBUTRIN XL) 24 hr tablet 150 mg  150 mg Oral Daily Patrecia Pour, NP   150 mg at 05/28/19 1037  . divalproex (DEPAKOTE) DR tablet 500 mg  500 mg Oral Q12H Patrecia Pour, NP   500 mg at 05/28/19 1037  . lithium carbonate (ESKALITH) CR tablet 450 mg  450 mg Oral Q12H Patrecia Pour, NP   450 mg at 05/28/19 1037  . magnesium hydroxide (MILK OF MAGNESIA) suspension 30 mL  30 mL Oral Daily PRN Patrecia Pour, NP      . traZODone (DESYREL) tablet 200 mg  200 mg Oral QHS Patrecia Pour, NP   200 mg at 05/27/19 2147   PTA Medications: Medications Prior to Admission  Medication Sig Dispense Refill Last Dose  . buPROPion (WELLBUTRIN XL) 150 MG 24 hr tablet Take 1 tablet (150 mg total) by mouth daily. 30 tablet 1   . divalproex (DEPAKOTE ER) 500 MG 24 hr tablet Take 2 tablets (1,000 mg total) by mouth at bedtime. 60 tablet 1   . lithium carbonate (ESKALITH) 450 MG CR tablet Take 1 tablet (450 mg total) by mouth every 12 (twelve) hours. 60 tablet 1   . Norgestimate-Ethinyl Estradiol Triphasic (ORTHO TRI-CYCLEN, 28,) 0.18/0.215/0.25 MG-35 MCG tablet Take 1 tablet by mouth daily.     . traZODone (DESYREL) 100 MG tablet Take 2 tablets (200 mg total) by mouth at bedtime. 60 tablet 1     Patient Stressors: Medication change or noncompliance Substance  abuse  Patient Strengths: Motivation for treatment/growth Supportive family/friends  Treatment Modalities: Medication Management, Group therapy, Case management,  1 to 1 session with clinician, Psychoeducation, Recreational therapy.   Physician Treatment Plan for Primary Diagnosis: <principal problem not specified> Long Term Goal(s): Improvement in symptoms so as ready for discharge Improvement in symptoms so as ready for discharge   Short Term Goals: Ability to identify changes in lifestyle to reduce recurrence of condition will improve Ability to verbalize feelings will improve Ability to disclose and discuss suicidal ideas Ability to demonstrate self-control will improve Ability to identify and develop effective coping behaviors will improve Ability to maintain clinical measurements within normal limits will improve Compliance with prescribed medications will improve Ability to identify triggers associated with substance abuse/mental health issues will improve Ability to identify changes in lifestyle to reduce recurrence of condition will improve Ability to verbalize feelings will improve Ability to disclose and discuss suicidal ideas Ability to demonstrate self-control will improve Ability to identify and develop effective coping behaviors will improve Ability to maintain clinical measurements within normal limits will improve Compliance with prescribed medications will improve Ability to identify triggers associated with substance abuse/mental health issues will improve  Medication Management: Evaluate patient's response, side effects, and tolerance of medication regimen.  Therapeutic Interventions: 1 to 1 sessions, Unit Group sessions and Medication administration.  Evaluation of Outcomes: Progressing  Physician Treatment Plan for Secondary Diagnosis: Active Problems:   Affective psychosis, bipolar ([place])  Long Term Goal(s): Improvement in symptoms so as ready for  discharge Improvement in symptoms so as ready for discharge   Short Term Goals: Ability to identify changes in lifestyle to reduce recurrence of condition will improve Ability to verbalize feelings will improve Ability to disclose and discuss suicidal ideas Ability to demonstrate self-control will improve Ability to identify and develop effective coping behaviors will improve Ability to maintain clinical measurements within normal limits will improve Compliance with prescribed medications will improve Ability to identify triggers associated with substance abuse/mental health issues will improve Ability to identify changes in lifestyle to reduce recurrence of condition will improve Ability to verbalize feelings will improve Ability to disclose and discuss suicidal ideas Ability to demonstrate self-control will improve Ability to identify and develop effective coping behaviors will improve Ability to maintain clinical measurements within normal limits will improve Compliance with prescribed medications will improve Ability to identify triggers associated with substance abuse/mental health issues will improve     Medication Management: Evaluate patient's response, side effects, and tolerance of medication regimen.  Therapeutic Interventions: 1 to 1 sessions, Unit Group sessions and Medication administration.  Evaluation of Outcomes: Progressing   RN Treatment Plan for Primary Diagnosis: <principal problem not specified> Long Term Goal(s): Knowledge of disease and therapeutic regimen to maintain health will improve  Short Term Goals: Ability to demonstrate self-control, Ability to participate in decision making will improve, Ability to verbalize feelings will improve, Ability to disclose and discuss suicidal ideas and Ability to identify and develop effective coping behaviors will improve  Medication Management: RN will administer medications as ordered by provider, will assess and  evaluate patient's response and provide education to patient for prescribed medication. RN will report any adverse and/or side effects to prescribing provider.  Therapeutic Interventions: 1 on 1 counseling sessions, Psychoeducation, Medication administration, Evaluate responses to treatment, Monitor vital signs and CBGs as ordered, Perform/monitor CIWA, COWS, AIMS and Fall Risk screenings as ordered, Perform wound care treatments as ordered.  Evaluation of Outcomes: Progressing   LCSW Treatment Plan for Primary Diagnosis: <principal problem not specified> Long Term Goal(s): Safe transition to appropriate next level of care at discharge, Engage patient in therapeutic group addressing interpersonal concerns.  Short Term Goals: Engage patient in aftercare planning with referrals and resources, Increase social support, Increase ability to appropriately verbalize feelings, Increase emotional regulation, Facilitate acceptance of mental health diagnosis and concerns, Facilitate patient progression through stages of change regarding substance use diagnoses and concerns and Identify triggers associated with mental health/substance abuse issues  Therapeutic Interventions: Assess for all discharge needs, 1 to 1 time with Social worker, Explore available resources and support systems, Assess for adequacy in community support network, Educate family and significant other(s) on suicide prevention, Complete Psychosocial Assessment, Interpersonal group therapy.  Evaluation of Outcomes: Progressing   Progress in Treatment: Attending groups: No. Participating in groups: No. Taking medication as prescribed: Yes. Toleration medication: Yes. Family/Significant other contact made: No, will contact:  pt declined. Patient understands diagnosis: Yes. Discussing patient identified problems/goals with staff: Yes. Medical problems stabilized or resolved: Yes. Denies suicidal/homicidal ideation: Yes. Issues/concerns  per patient self-inventory: No. Other: none   New problem(s) identified: No, Describe:  none  New Short Term/Long Term Goal(s): detox, elimination of symptoms of psychosis, medication management for mood stabilization; elimination of SI thoughts; development of comprehensive mental wellness/sobriety plan.  Patient Goals:  "better coping skills when I am  out in society"  Discharge Plan or Barriers: Patient reports plans to return home. Patient reports that she has an appointment with [facility] on 7/22.  Reason for Continuation of Hospitalization: Delusions  Hallucinations Medication stabilization Withdrawal symptoms  Estimated Length of Stay: 1-5 days  Attendees: Patient: Autumn [name] 05/28/2019 11:14 AM  Physician:  05/28/2019 11:14 AM  Nursing: Nat Math Ravenell 05/28/2019 11:14 AM  RN Care Manager: 05/28/2019 11:14 AM  Social Worker: Assunta Curtis, LCSW 05/28/2019 11:14 AM  Recreational Therapist: Roanna Epley, Reather Converse, LRT 05/28/2019 11:14 AM  Other: Sanjuana Kava, LCSW 05/28/2019 11:14 AM  Other: Evalina Field, LCSW 05/28/2019 11:14 AM  Other: 05/28/2019 11:14 AM    Scribe for Treatment Team: Rozann Lesches, LCSW 05/28/2019 11:14 AM

## 2019-05-28 NOTE — Plan of Care (Signed)
  Patient pleasant calm but isolative in her room most of the shift. Compliance with medication and denies any SI/SIB. Resting comfortably in bed with respiration noted. Will continues with q15 safety check.   Problem: Education: Goal: Emotional status will improve Outcome: Progressing Goal: Mental status will improve Outcome: Progressing   Problem: Safety: Goal: Ability to redirect hostility and anger into socially appropriate behaviors will improve Outcome: Progressing Goal: Ability to remain free from injury will improve Outcome: Progressing

## 2019-05-28 NOTE — Plan of Care (Signed)
Mostly in bed. Calm and cooperative. Reports that she is tired secondary to recent stressful situation that precipitated her admission.

## 2019-05-28 NOTE — [facility] Group Notes (Signed)
LCSW Group Therapy Note   05/28/2019 12:39 PM   Type of Therapy and Topic:  Group Therapy:  Overcoming Obstacles   Participation Level:  Active   Description of Group:    In this group patients will be encouraged to explore what they see as obstacles to their own wellness and recovery. They will be guided to discuss their thoughts, feelings, and behaviors related to these obstacles. The group will process together ways to cope with barriers, with attention given to specific choices patients can make. Each patient will be challenged to identify changes they are motivated to make in order to overcome their obstacles. This group will be process-oriented, with patients participating in exploration of their own experiences as well as giving and receiving support and challenge from other group members.   Therapeutic Goals: 1. Patient will identify personal and current obstacles as they relate to admission. 2. Patient will identify barriers that currently interfere with their wellness or overcoming obstacles.  3. Patient will identify feelings, thought process and behaviors related to these barriers. 4. Patient will identify two changes they are willing to make to overcome these obstacles:      Summary of Patient Progress Pt was appropriate and respectful in group. Pt was able to identify her current obstacles as needing to apply for disability and not being able to see because she lost her glasses and contacts and cannot afford new ones right now. Pt reported that she is going to work on applying for disability when she discharges and be involved in therapy and identified transportation as an obstacle stating that previously she was missing her appointments because she could not get to her scheduled appointments. Pt reported that to cope she usually watches tv, listen to music, and read.     Therapeutic Modalities:   Cognitive Behavioral Therapy Solution Focused Therapy Motivational  Interviewing Relapse Prevention Therapy  Evalina Field, MSW, LCSW Clinical Social Work 05/28/2019 12:39 PM

## 2019-05-29 DIAGNOSIS — F312 Bipolar disorder, current episode manic severe with psychotic features: Secondary | ICD-10-CM

## 2019-05-29 MED ORDER — BUPROPION HCL ER (XL) 150 MG PO TB24: 150.0000 mg | ORAL_TABLET | Freq: Every day | ORAL | 0 refills | Status: DC

## 2019-05-29 MED ORDER — TRAZODONE HCL 100 MG PO TABS: 200.0000 mg | ORAL_TABLET | Freq: Every day | ORAL | 0 refills | Status: DC

## 2019-05-29 MED ORDER — LITHIUM CARBONATE ER 450 MG PO TBCR: 450.0000 mg | EXTENDED_RELEASE_TABLET | Freq: Two times a day (BID) | ORAL | 0 refills | Status: DC

## 2019-05-29 MED ORDER — DIVALPROEX SODIUM 500 MG PO DR TAB: 500.0000 mg | DELAYED_RELEASE_TABLET | Freq: Two times a day (BID) | ORAL | 0 refills | Status: DC

## 2019-05-29 NOTE — Progress Notes (Signed)
[facility] MD Progress Note  05/29/2019 3:30 PM Autumn [name]  MRN:  [id number]   Subjective: Patient reports that she is doing much better today.  She denies any suicidal or homicidal ideations and denies any hallucinations.  Patient states that she has been sleeping well and she has been having a good appetite.  Patient states that she knows that she is going to be discharging tomorrow and to be able to leave in time to make it to her appointment tomorrow morning at [facility] at 945.  Patient denies any medication side effects states that she has does feel that the medications are helping her financially.  Objective: Patient's chart and findings reviewed and discussed with treatment team.  Patient is pleasant, calm, and cooperative.  There have been no complaints but the patient on the unit at this time.  Patient is presents in her room lying in the bed but she is easily arousable and communicates well. Plan has been made for the patient to discharge early in the morning to make it to her appointment at [facility] at [place].   Principal Problem: <principal problem not specified> Diagnosis: Active Problems:   Affective psychosis, bipolar ([place])  Total Time spent with patient: 20 minutes  Past Psychiatric History: See previous  Past Medical History:  Past Medical History:  Diagnosis Date  . Allergy    Seasonal, Ultram (seizures)  . Anemia 2005  . Bipolar 1 disorder ([place])   . Glaucoma   . Seizures ([place]) 2008    Past Surgical History:  Procedure Laterality Date  . BREAST BIOPSY Right    x 2  . CESAREAN SECTION     x 2   Family History:  Family History  Problem Relation Age of Onset  . Hypertension Mother   . Hyperlipidemia Mother   . Parkinson's disease Father    Family Psychiatric  History: See previous Social History:  Social History   Substance and Sexual Activity  Alcohol Use Yes  . Alcohol/week: 4.0 standard drinks  . Types: 4 Cans of beer per week   Comment:  Ocassional     Social History   Substance and Sexual Activity  Drug Use Yes  . Types: Marijuana   Comment: percocet -last use over 1 week ago. Heroin-last used 2/11    Social History   Socioeconomic History  . Marital status: Single    Spouse name: Not on file  . Number of children: Not on file  . Years of education: Not on file  . Highest education level: Not on file  Occupational History  . Not on file  Social Needs  . Financial resource strain: Not on file  . Food insecurity    Worry: Not on file    Inability: Not on file  . Transportation needs    Medical: Not on file    Non-medical: Not on file  Tobacco Use  . Smoking status: Current Every Day Smoker    Packs/day: 1.00    Years: 9.00    Pack years: 9.00  . Smokeless tobacco: Never Used  . Tobacco comment: Patient not interested  Substance and Sexual Activity  . Alcohol use: Yes    Alcohol/week: 4.0 standard drinks    Types: 4 Cans of beer per week    Comment: Ocassional  . Drug use: Yes    Types: Marijuana    Comment: percocet -last use over 1 week ago. Heroin-last used 2/11  . Sexual activity: Yes    Birth control/protection:  None  Lifestyle  . Physical activity    Days per week: Not on file    Minutes per session: Not on file  . Stress: Not on file  Relationships  . Social Herbalist on phone: Not on file    Gets together: Not on file    Attends religious service: Not on file    Active member of club or organization: Not on file    Attends meetings of clubs or organizations: Not on file    Relationship status: Not on file  Other Topics Concern  . Not on file  Social History Narrative  . Not on file   Additional Social History:                         Sleep: Good  Appetite:  Good  Current Medications: Current Facility-Administered Medications  Medication Dose Route Frequency Provider Last Rate Last Dose  . acetaminophen (TYLENOL) tablet 650 mg  650 mg Oral Q6H PRN Patrecia Pour, NP   650 mg at 05/26/19 1533  . alum & mag hydroxide-simeth (MAALOX/MYLANTA) 200-200-20 MG/5ML suspension 30 mL  30 mL Oral Q4H PRN Patrecia Pour, NP      . buPROPion (WELLBUTRIN XL) 24 hr tablet 150 mg  150 mg Oral Daily Patrecia Pour, NP   150 mg at 05/29/19 0819  . divalproex (DEPAKOTE) DR tablet 500 mg  500 mg Oral Q12H Patrecia Pour, NP   500 mg at 05/29/19 0354  . hydrocortisone cream 0.5 %   Topical TID Patrecia Pour, NP      . lithium carbonate (ESKALITH) CR tablet 450 mg  450 mg Oral Q12H Patrecia Pour, NP   450 mg at 05/29/19 0819  . magnesium hydroxide (MILK OF MAGNESIA) suspension 30 mL  30 mL Oral Daily PRN Patrecia Pour, NP      . traZODone (DESYREL) tablet 200 mg  200 mg Oral QHS Patrecia Pour, NP   200 mg at 05/28/19 2150    Lab Results:  Results for orders placed or performed during the hospital encounter of 05/25/19 (from the past 48 hour(s))  Lithium level     Status: None   Collection Time: 05/28/19  6:38 AM  Result Value Ref Range   Lithium Lvl 0.82 0.60 - 1.20 mmol/L    Comment: Performed at [facility], [address] [place], [place] [address]  Valproic acid level     Status: None   Collection Time: 05/28/19  6:38 AM  Result Value Ref Range   Valproic Acid Lvl 92 50.0 - 100.0 ug/mL    Comment: Performed at [facility], [address] [place], [place] [address]    Blood Alcohol level:  Lab Results  Component Value Date   [facility] <10 05/25/2019   ETH <10 00/17/4944    Metabolic Disorder Labs: Lab Results  Component Value Date   HGBA1C 5.0 11/21/2018   MPG 96.8 11/21/2018   MPG 93.93 04/13/2018   No results found for: PROLACTIN Lab Results  Component Value Date   CHOL 196 11/21/2018   TRIG 97 11/21/2018   HDL 53 11/21/2018   CHOLHDL 3.7 11/21/2018   VLDL 19 11/21/2018   LDLCALC 124 (H) 11/21/2018   LDLCALC 117 (H) 04/13/2018    Physical Findings: AIMS:  , ,  ,  ,    CIWA:    COWS:  Musculoskeletal: Strength & Muscle Tone: within normal limits Gait & Station: normal Patient leans: N/A  Psychiatric Specialty Exam: Physical Exam  Nursing note and vitals reviewed. Constitutional: She is oriented to person, place, and time. She appears well-developed and well-nourished.  Cardiovascular: Normal rate.  Respiratory: Effort normal.  Musculoskeletal: Normal range of motion.  Neurological: She is alert and oriented to person, place, and time.    Review of Systems  Constitutional: Negative.   HENT: Negative.   Eyes: Negative.   Respiratory: Negative.   Cardiovascular: Negative.   Gastrointestinal: Negative.   Genitourinary: Negative.   Musculoskeletal: Negative.   Skin: Negative.   Neurological: Negative.   Endo/Heme/Allergies: Negative.   Psychiatric/Behavioral: Negative.     Blood pressure 106/70, pulse 75, temperature 98.1 F (36.7 C), temperature source Oral, resp. rate 18, height 5\' 2"  (1.575 m), weight 52.2 kg, SpO2 100 %.Body mass index is 21.05 kg/m.  General Appearance: Casual  Eye Contact:  Good  Speech:  Clear and Coherent and Normal Rate  Volume:  Normal  Mood:  Euthymic  Affect:  Congruent  Thought Process:  Coherent and Descriptions of Associations: Intact  Orientation:  Full (Time, Place, and Person)  Thought Content:  WDL  Suicidal Thoughts:  No  Homicidal Thoughts:  No  Memory:  Immediate;   Good Recent;   Good Remote;   Good  Judgement:  Good  Insight:  Fair  Psychomotor Activity:  Normal  Concentration:  Concentration: Good and Attention Span: Good  Recall:  Good  Fund of Knowledge:  Good  Language:  Good  Akathisia:  No  Handed:  Right  AIMS (if indicated):     Assets:  Communication Skills Desire for Improvement Financial Resources/Insurance Housing Physical Health Resilience Social Support Transportation  ADL's:  Intact  Cognition:  WNL  Sleep:  Number of Hours: 7.5     Treatment Plan Summary: Daily contact  with patient to assess and evaluate symptoms and progress in treatment and Medication management  Patient is showing significant improvement with medications that she has been prescribed.  Plan is to continue Wellbutrin XL 150 mg p.o. daily, Depakote DR 500 mg p.o. every 12 hours, lithium carbonate CR 450 mg p.o. every 12 hours, and trazodone 200 mg p.o. nightly to maintain stability.  Plan is for patient to discharge tomorrow morning and will follow-up at her appointment with [facility] at 9:45 AM.  Patient will be given prescriptions and medication samples upon discharge tomorrow.  [place], FNP 05/29/2019, 3:30 PM

## 2019-05-29 NOTE — Consult Note (Signed)
[facility] for medication samples - 7 day samples for discharge Indication:  Patient does not have insurance  Allergies  Allergen Reactions  . Haldol [Haloperidol Lactate]     Patient states that she gets stiff muscles when taking Haldol  . Ultram [Tramadol] Other (See Comments)    Seizures    Medical History: Past Medical History:  Diagnosis Date  . Allergy    Seasonal, Ultram (seizures)  . Anemia 2005  . Bipolar 1 disorder ([place])   . Glaucoma   . Seizures ([place]) 2008    Medications:  Wellbutrin XL 150 mg p.o. daily, Depakote DR 500 mg p.o. every 12 hours, lithium carbonate CR 450 mg p.o. every 12 hours, and trazodone 200 mg p.o. nightly.  Assessment: Patient does not have insurance and is a candidate for medication samples. Patient will need samples by tomorrow prior to discharge.    Plan:  Discussed with Marvia Pickles, NP to send a hard copy prescription to the main pharmacy or send an electronic prescription to [place] [place]. In either case, the medications will be filled in the outpatient pharmacy and sent to the inpatient pharmacy to be delivered to BH/BH nurse to pick-up prescriptions.   Rowland Lathe 05/29/2019,4:06 PM

## 2019-05-29 NOTE — Progress Notes (Signed)
Recreation Therapy Notes  Date: 05/29/2019  Time: 9:30 am  Location: Craft room  Behavioral response: Appropriate   Intervention Topic: Anger Management   Discussion/Intervention:  Group content on today was focused on anger management. The group defined anger and reasons they become angry. Individuals expressed negative way they have dealt with anger in the past. Patients stated some positive ways they could deal with anger in the future. The group described how anger can affect your health and daily plans. Individuals participated in the intervention "Score your anger" where they had a chance to answer questions about themselves and get a score of their anger.  Clinical Observations/Feedback:  Patient came to group and stated she manages her anger by journaling. She expressed that anger management is important so you do not end up in jail. Participant identified waring signs that she is angry as increased blood pressure and grinding her teeth.  Individual was social with peers and staff while participating in the intervention.  Jordin Vicencio LRT/CTRS         Lawton Dollinger 05/29/2019 10:52 AM

## 2019-05-29 NOTE — BHH Suicide Risk Assessment (Signed)
[facility] INPATIENT:  Family/Significant Other Suicide Prevention Education  Suicide Prevention Education:  Education Completed; Lennie Muckle, roommate [phone] has been identified by the patient as the family member/significant other with whom the patient will be residing, and identified as the person(s) who will aid the patient in the event of a mental health crisis (suicidal ideations/suicide attempt).  With written consent from the patient, the family member/significant other has been provided the following suicide prevention education, prior to the and/or following the discharge of the patient.  The suicide prevention education provided includes the following:  Suicide risk factors  Suicide prevention and interventions  National Suicide Hotline telephone number  [facility] assessment telephone number  [facility] Emergency Assistance [place] and/or Residential Mobile Crisis Unit telephone number  Request made of family/significant other to:  Remove weapons (e.g., guns, rifles, knives), all items previously/currently identified as safety concern.    Remove drugs/medications (over-the-counter, prescriptions, illicit drugs), all items previously/currently identified as a safety concern.  The family member/significant other verbalizes understanding of the suicide prevention education information provided.  The family member/significant other agrees to remove the items of safety concern listed above.  CSW spoke with Broadus John who reported that pt is in the hospital due to being manic. Per Broadus John, pt seems to get like this in the summer and has manic phases. Broadus John stated that pt has not been on all her medication because he cannot afford them and he supports her. Broadus John reported he is only working part time due to [place] and not bringing in as much money. Broadus John denied concerns for SI/HI and denied any weapons being in the home. Broadus John stated he will pick pt up at  [facility] tomorrow and take her to her appointment at [facility].  [place] MSW LCSW 05/29/2019, 4:00 PM

## 2019-05-29 NOTE — Plan of Care (Signed)
  Problem: Education: Goal: Knowledge of [facility] Education information/materials will improve Outcome: Progressing Goal: Emotional status will improve Outcome: Progressing Goal: Mental status will improve Outcome: Progressing Goal: Verbalization of understanding the information provided will improve Outcome: Progressing   Problem: Safety: Goal: Periods of time without injury will increase Outcome: Progressing   Problem: Education: Goal: Will be free of psychotic symptoms Outcome: Progressing Goal: Knowledge of the prescribed therapeutic regimen will improve Outcome: Progressing   Problem: Safety: Goal: Ability to redirect hostility and anger into socially appropriate behaviors will improve Outcome: Progressing Goal: Ability to remain free from injury will improve Outcome: Progressing

## 2019-05-29 NOTE — Discharge Summary (Addendum)
Physician Discharge Summary Note  Patient:  Autumn [name] is an 43 y.o., female MRN:  [id number] DOB:  1976/03/02 Patient phone:  [phone] (home)  Patient address:   [place] [address],  Total Time spent with patient: 20 minutes  Date of Admission:  05/25/2019 Date of Discharge: 05/30/19  Reason for Admission:  Worsening bizarre and manic behavior  Principal Problem: Affective psychosis, bipolar ([place]) Discharge Diagnoses: Principal Problem:   Affective psychosis, bipolar ([place])   Past Psychiatric History: past Dx: bipolar disorder. Several psych admissions. Denies suicidal attempts. Reports non-suicidal self-injurious behaviors. Questionable med-compliance.   Past Medical History:  Past Medical History:  Diagnosis Date  . Allergy    Seasonal, Ultram (seizures)  . Anemia 2005  . Bipolar 1 disorder ([place])   . Glaucoma   . Seizures ([place]) 2008    Past Surgical History:  Procedure Laterality Date  . BREAST BIOPSY Right    x 2  . CESAREAN SECTION     x 2   Family History:  Family History  Problem Relation Age of Onset  . Hypertension Mother   . Hyperlipidemia Mother   . Parkinson's disease Father    Family Psychiatric  History: None reported Social History:  Social History   Substance and Sexual Activity  Alcohol Use Yes  . Alcohol/week: 4.0 standard drinks  . Types: 4 Cans of beer per week   Comment: Ocassional     Social History   Substance and Sexual Activity  Drug Use Yes  . Types: Marijuana   Comment: percocet -last use over 1 week ago. Heroin-last used 2/11    Social History   Socioeconomic History  . Marital status: Single    Spouse name: Not on file  . Number of children: Not on file  . Years of education: Not on file  . Highest education level: Not on file  Occupational History  . Not on file  Social Needs  . Financial resource strain: Not on file  . Food insecurity    Worry: Not on file    Inability: Not on  file  . Transportation needs    Medical: Not on file    Non-medical: Not on file  Tobacco Use  . Smoking status: Current Every Day Smoker    Packs/day: 1.00    Years: 9.00    Pack years: 9.00  . Smokeless tobacco: Never Used  . Tobacco comment: Patient not interested  Substance and Sexual Activity  . Alcohol use: Yes    Alcohol/week: 4.0 standard drinks    Types: 4 Cans of beer per week    Comment: Ocassional  . Drug use: Yes    Types: Marijuana    Comment: percocet -last use over 1 week ago. Heroin-last used 2/11  . Sexual activity: Yes    Birth control/protection: None  Lifestyle  . Physical activity    Days per week: Not on file    Minutes per session: Not on file  . Stress: Not on file  Relationships  . Social Herbalist on phone: Not on file    Gets together: Not on file    Attends religious service: Not on file    Active member of club or organization: Not on file    Attends meetings of clubs or organizations: Not on file    Relationship status: Not on file  Other Topics Concern  . Not on file  Social History Narrative  . Not on file  Hospital Course:   43yo female with past psych h/o bipolar disorder, substance use disorder, who was admitted yesterday from ED due to bizarre and disorganized behavior. Patient was brought to ER via law enforcement due to having odd and bizarre behaviors. She repotredly hasn't had medications for several days. In ER she reportedly presented manic with disorganized thoughts and tangential speech.Inpatient admission recommended. Upon admission patient`s psych medications were restarted. On interview in the unit today, patient was in bed and appeared sedated. She reports "feeling better". She admits she was not always compliant with medications at home and using drugs - cannabis and "other". Ast the moment, she denies any mental complaints - denies feeling depressed, anxious, suicidal, homicidal, denies any hallucinations,  does not express delusions. Although she denies feeling depressed at the moment, she reports occasional depressed mood at home and occasional self-harming behaviors, such as burning self with a cigarette; states she did it last week "when I asked a guy for a couple of dollars and did not get it, so I burned self do not get upset".  Patient remained on the [facility] unit for 4 days. The patient stabilized on medication and therapy. Patient was discharged on Wellbutrin XL 150 mg Daily, Depakote DR 500 mg Q12H, Eskalith 450 mg Q12H, and Trazodone 200 mg QHS. Patient has shown improvement with improved mood, affect, sleep, appetite, and interaction. Patient has attended group and participated. Patient has been seen in the day room interacting with peers and staff appropriately. Patient denies any SI/HI/AVH and contracts for safety. Patient agrees to follow up at [facility]. Patient is provided with prescriptions for their medications upon discharge.  Physical Findings: AIMS:  , ,  ,  ,    CIWA:    COWS:     Musculoskeletal: Strength & Muscle Tone: within normal limits Gait & Station: normal Patient leans: N/A  Psychiatric Specialty Exam: Physical Exam  Nursing note and vitals reviewed. Constitutional: She is oriented to person, place, and time. She appears well-developed and well-nourished.  Cardiovascular: Normal rate.  Respiratory: Effort normal.  Musculoskeletal: Normal range of motion.  Neurological: She is alert and oriented to person, place, and time.  Skin: Skin is warm.    Review of Systems  Constitutional: Negative.   HENT: Negative.   Eyes: Negative.   Respiratory: Negative.   Cardiovascular: Negative.   Gastrointestinal: Negative.   Genitourinary: Negative.   Musculoskeletal: Negative.   Skin: Negative.   Neurological: Negative.   Endo/Heme/Allergies: Negative.   Psychiatric/Behavioral: Negative.     Blood pressure 106/70, pulse 75, temperature 98.1 F (36.7 C),  temperature source Oral, resp. rate 18, height 5\' 2"  (1.575 m), weight 52.2 kg, SpO2 100 %.Body mass index is 21.05 kg/m.  General Appearance: Casual  Eye Contact:  Good  Speech:  Clear and Coherent and Normal Rate  Volume:  Normal  Mood:  Euthymic  Affect:  Congruent  Thought Process:  Coherent and Descriptions of Associations: Intact  Orientation:  Full (Time, Place, and Person)  Thought Content:  WDL  Suicidal Thoughts:  No  Homicidal Thoughts:  No  Memory:  Immediate;   Good Recent;   Good Remote;   Good  Judgement:  Fair  Insight:  Fair  Psychomotor Activity:  Normal  Concentration:  Concentration: Good  Recall:  Good  Fund of Knowledge:  Good  Language:  Good  Akathisia:  No  Handed:  Right  AIMS (if indicated):     Assets:  Communication Skills Desire for Improvement  Financial Resources/Insurance Housing Physical Health Resilience Social Support Transportation  ADL's:  Intact  Cognition:  WNL  Sleep:  Number of Hours: 7.5        Has this patient used any form of tobacco in the last 30 days? (Cigarettes, Smokeless Tobacco, Cigars, and/or Pipes) Yes, No  Blood Alcohol level:  Lab Results  Component Value Date   ETH <10 05/25/2019   ETH <10 17/51/0258    Metabolic Disorder Labs:  Lab Results  Component Value Date   HGBA1C 5.0 11/21/2018   MPG 96.8 11/21/2018   MPG 93.93 04/13/2018   No results found for: PROLACTIN Lab Results  Component Value Date   CHOL 196 11/21/2018   TRIG 97 11/21/2018   HDL 53 11/21/2018   CHOLHDL 3.7 11/21/2018   VLDL 19 11/21/2018   LDLCALC 124 (H) 11/21/2018   LDLCALC 117 (H) 04/13/2018    See Psychiatric Specialty Exam and Suicide Risk Assessment completed by Attending Physician prior to discharge.  Discharge destination:  Home  Is patient on multiple antipsychotic therapies at discharge:  No   Has Patient had three or more failed trials of antipsychotic monotherapy by history:  No  Recommended Plan for Multiple  Antipsychotic Therapies: NA  Discharge Instructions    Diet - low sodium heart healthy   Complete by: As directed    Increase activity slowly   Complete by: As directed      Allergies as of 05/29/2019      Reactions   Haldol [haloperidol Lactate]    Patient states that she gets stiff muscles when taking Haldol   Ultram [tramadol] Other (See Comments)   Seizures      Medication List    STOP taking these medications   divalproex 500 MG 24 hr tablet Commonly known as: DEPAKOTE ER Replaced by: divalproex 500 MG DR tablet     TAKE these medications     Indication  buPROPion 150 MG 24 hr tablet Commonly known as: WELLBUTRIN XL Take 1 tablet (150 mg total) by mouth daily.  Indication: Major Depressive Disorder   divalproex 500 MG DR tablet Commonly known as: DEPAKOTE Take 1 tablet (500 mg total) by mouth every 12 (twelve) hours. Replaces: divalproex 500 MG 24 hr tablet  Indication: mood stability   lithium carbonate 450 MG CR tablet Commonly known as: ESKALITH Take 1 tablet (450 mg total) by mouth every 12 (twelve) hours.  Indication: Manic-Depression   Ortho Tri-Cyclen (28) 0.18/0.215/0.25 MG-35 MCG tablet Generic drug: Norgestimate-Ethinyl Estradiol Triphasic Take 1 tablet by mouth daily.  Indication: Dysfunctional Bleeding From the Uterus   traZODone 100 MG tablet Commonly known as: DESYREL Take 2 tablets (200 mg total) by mouth at bedtime.  Indication: Trouble Sleeping      Follow-up Information    [facility] [employer] [facility] Follow up on 05/30/2019.   Why: Please follow up with [facility] on Wednesday, July 22nd at 9:45am. Please wear a mask and bring discharge paperwork and medication list with you. Thank You! Contact information: [place] [address] [phone]           Follow-up recommendations:  Continue activity as tolerated. Continue diet as recommended by your PCP. Ensure to keep all appointments with  outpatient providers.  Comments:  Patient is instructed prior to discharge to: Take all medications as prescribed by his/her mental healthcare provider. Report any adverse effects and or reactions from the medicines to his/her outpatient provider promptly. Patient has been instructed & cautioned:  To not engage in alcohol and or illegal drug use while on prescription medicines. In the event of worsening symptoms, patient is instructed to call the crisis hotline, 911 and or go to the nearest ED for appropriate evaluation and treatment of symptoms. To follow-up with his/her primary care provider for your other medical issues, concerns and or health care needs.    Signed: [place], FNP 05/29/2019, 7:12 PM

## 2019-05-29 NOTE — Progress Notes (Signed)
Patient alert and oriented x 4, affect is flat but she brightens upon approach, her thought are organized and appropriate, no distress noted, patient is complaint with medication regimen, minimally interaction with peers and staff, she rated depression a  5/10 on a scale of ( 0 low -10 high) Patient denies SI/HI/AVH no bizarre behavior, appears less manic, 15 minutes safety checks maintained will continue to monitor. Marland Kitchen

## 2019-05-29 NOTE — [facility] Suicide Risk Assessment (Addendum)
[facility] Suicide Risk Assessment   Principal Problem: <principal problem not specified> Discharge Diagnoses: Active Problems:   Affective psychosis, bipolar ([place])   Total Time spent with patient: 45 minutes  Musculoskeletal: Strength & Muscle Tone: within normal limits Gait & Station: normal Patient leans: N/A  Psychiatric Specialty Exam: Review of Systems  Constitutional: Negative.  Negative for fever and malaise/fatigue.  HENT: Negative.  Negative for congestion, hearing loss and sore throat.   Eyes: Negative.  Negative for blurred vision, double vision, discharge and redness.  Respiratory: Negative.  Negative for cough, shortness of breath and wheezing.   Cardiovascular: Negative.  Negative for chest pain and palpitations.  Gastrointestinal: Negative.  Negative for abdominal pain, constipation, diarrhea, heartburn, nausea and vomiting.  Musculoskeletal: Negative.  Negative for falls, joint pain and myalgias.  Neurological: Negative.  Negative for dizziness, seizures, loss of consciousness and headaches.  Endo/Heme/Allergies: Negative.  Negative for environmental allergies.  Psychiatric/Behavioral: Negative.  Negative for depression, hallucinations, substance abuse and suicidal ideas. The patient is not nervous/anxious and does not have insomnia.     Blood pressure 106/70, pulse 75, temperature 98.1 F (36.7 C), temperature source Oral, resp. rate 18, height 5\' 2"  (1.575 m), weight 52.2 kg, SpO2 100 %.Body mass index is 21.05 kg/m.   Mental Status Per Nursing Assessment::   On Admission:  NA  Demographic Factors:  Caucasian and Living alone  Loss Factors: Decline in physical health  Historical Factors: Impulsivity  Risk Reduction Factors:   Sense of responsibility to family and Positive social support  Continued Clinical Symptoms:  Severe Anxiety and/or Agitation Currently Psychotic  Cognitive Features That Contribute To Risk:  Loss of executive function  and Polarized thinking    Suicide Risk:  Minimal: No identifiable suicidal ideation.  Patients presenting with no risk factors but with morbid ruminations; may be classified as minimal risk based on the severity of the depressive symptoms  Follow-up Information    [facility] Follow up on 05/30/2019.   Why: Please follow up with [facility] on Wednesday, July 22nd at 9:45am. Please wear a mask and bring discharge paperwork and medication list with you. Thank You! Contact information: [place] [address] [phone]           Plan Of Care/Follow-up recommendations:  See above for details Hampton Abbot, MD 05/29/2019, 5:02 PM

## 2019-05-29 NOTE — Progress Notes (Signed)
Pt is present and active on unit. She has been less isolative than on previous days. She denies needs or concerns. Pt denies SI, HI AVH and contracts for safety. Pt is taking medications and is aggreable with treatment plan. No distress is noted and no concerns were voiced. Will continue to monitor. Safety maintained.

## 2019-05-29 NOTE — Progress Notes (Signed)
Recreation Therapy Notes  INPATIENT RECREATION THERAPY ASSESSMENT  Patient Details Name: Autumn [name] MRN: [id number] DOB: December 05, 1975 Today's Date: 05/29/2019       Information Obtained From: Patient  Able to Participate in Assessment/Interview: Yes  Patient Presentation: Responsive  Reason for Admission (Per Patient): Active Symptoms, Med Non-Compliance  Patient Stressors:    Coping Skills:   TV, Music, Read  Leisure Interests (2+):  Exercise - Walking, Sports - Swimming  Frequency of Recreation/Participation: Monthly  Awareness of Community Resources:     [employer]:     Current Use:    If no, Barriers?:    Expressed Interest in [place] of Residence:  Insurance underwriter  Patient Main Form of Transportation: Walk  Patient Strengths:  Caring about others  Patient Identified Areas of Improvement:  Not trusting everyone  Patient Goal for Hospitalization:  To gain better coping skills  Current SI (including self-harm):  No  Current HI:  No  Current AVH: No  Staff Intervention Plan: Group Attendance, Collaborate with Interdisciplinary Treatment Team  Consent to Intern Participation: N/A  Autumn [name] 05/29/2019, 1:25 PM

## 2019-05-29 NOTE — Plan of Care (Signed)
  Problem: Education: Goal: Will be free of psychotic symptoms Outcome: Progressing  Patient appears less psychotic; thoughts are organized.

## 2019-05-30 NOTE — Progress Notes (Signed)
D - Patient was in her room upon arrival to the unit. Patient was pleasant but isolative this evening. Patient was compliant with medication administration and procedures on the unit. Patient denies SI/HI/AVH, pain, anxiety and depression with this Probation officer. Patient was isolative to her room this evening but did get up for snack and medications.   A - Patient didn't have any medication scheduled tonight. Patient was compliant with procedures on the unit. Patient given education. Patient given support and encouragement to be active in her treatment plan. Patient informed to let staff know if there are any issues or problems on the unit.   R - Patient being monitored Q 15 minutes for safety per unit protocol. Patient remains safe on the unit.

## 2019-05-30 NOTE — Progress Notes (Signed)
Pt received AVS, transition record, suicide risk assessment and prescriptions. Pt denies SI, HI and AVH. Pt was educated on care plan and verbalizes undersatnding. Collier Bullock RN

## 2019-05-30 NOTE — Progress Notes (Signed)
Recreation Therapy Notes  INPATIENT RECREATION TR PLAN  Patient Details Name: Autumn [name] MRN: [id number] DOB: 1975/11/09 Today's Date: 05/30/2019  Rec Therapy Plan Is patient appropriate for Therapeutic Recreation?: Yes Treatment times per week: at least 3 Estimated Length of Stay: 5-7 days TR Treatment/Interventions: Group participation (Comment)  Discharge Criteria Pt will be discharged from therapy if:: Discharged Treatment plan/goals/alternatives discussed and agreed upon by:: Patient/family  Discharge Summary Short term goals set: Patient will identify 3 positive coping skills strategies to use post d/c within 5 recreation therapy group sessions Short term goals met: Adequate for discharge Progress toward goals comments: Groups attended Which groups?: Anger management Reason goals not met: N/A Therapeutic equipment acquired: N/A Reason patient discharged from therapy: Discharge from hospital Pt/family agrees with progress & goals achieved: Yes Date patient discharged from therapy: 05/30/19   Vitor Overbaugh 05/30/2019, 1:20 PM

## 2019-05-30 NOTE — Plan of Care (Signed)
  Problem: Coping Skills Goal: STG - Patient will identify 3 positive coping skills strategies to use post d/c within 5 recreation therapy group sessions Description: STG - Patient will identify 3 positive coping skills strategies to use post d/c within 5 recreation therapy group sessions 05/30/2019 1317 by Ernest Haber, LRT Outcome: Adequate for Discharge 05/30/2019 1317 by Ernest Haber, LRT Outcome: Adequate for Discharge

## 2019-05-30 NOTE — Progress Notes (Signed)
  [facility] Adult Case Management Discharge Plan :  Will you be returning to the same living situation after discharge:  Yes,  home At discharge, do you have transportation home?: Yes,  roommate will pick pt up at 9am Do you have the ability to pay for your medications: Yes,  mental health, med management clinic  Release of information consent forms completed and in the chart;    Patient to Follow up at: Follow-up Information    [facility] Follow up on 05/30/2019.   Why: Please follow up with [facility] on Wednesday, July 22nd at 9:45am. Please wear a mask and bring discharge paperwork and medication list with you. Thank You! Contact information: [place] [address] [phone]           Next level of care provider has access to [place] and Suicide Prevention discussed: Yes,  SPE completed with pts roommate     Has patient been referred to the Quitline?: Patient refused referral  Patient has been referred for addiction treatment: N/A  Delfin Edis, LCSW 05/30/2019, 8:46 AM

## 2019-05-30 NOTE — Plan of Care (Signed)
Pt rates depression 1/10 and 0/10 anxiety. Pt denies SI, HI and AVH. Pt was educated on care plan and verbalizes understanding. Collier Bullock RN

## 2019-05-30 NOTE — Plan of Care (Signed)
Patient isolative to her room this evening.   Problem: Education: Goal: Emotional status will improve Outcome: Not Progressing Goal: Mental status will improve Outcome: Not Progressing

## 2019-06-02 ENCOUNTER — Emergency Department
Admission: EM | Admit: 2019-06-02 | Discharge: 2019-06-02 | Disposition: A | Discharge status: Other Acute Inpatient Hospital | Payer: Medicaid Other | Attending: Emergency Medicine | Admitting: Emergency Medicine

## 2019-06-02 ENCOUNTER — Inpatient Hospital Stay
Admission: AD | Admit: 2019-06-02 | Discharge: 2019-06-08 | DRG: 885 | Disposition: A | Discharge status: Home / Self Care | Payer: No Typology Code available for payment source | Source: Intra-hospital | Attending: Psychiatry | Admitting: Psychiatry

## 2019-06-02 ENCOUNTER — Other Ambulatory Visit: Payer: Self-pay

## 2019-06-02 ENCOUNTER — Encounter: Payer: Self-pay | Admitting: Psychiatry

## 2019-06-02 DIAGNOSIS — F1220 Cannabis dependence, uncomplicated: Secondary | ICD-10-CM | POA: Diagnosis present

## 2019-06-02 DIAGNOSIS — Z888 Allergy status to other drugs, medicaments and biological substances status: Secondary | ICD-10-CM | POA: Insufficient documentation

## 2019-06-02 DIAGNOSIS — F3010 Manic episode without psychotic symptoms, unspecified: Secondary | ICD-10-CM

## 2019-06-02 DIAGNOSIS — Z20828 Contact with and (suspected) exposure to other viral communicable diseases: Secondary | ICD-10-CM | POA: Insufficient documentation

## 2019-06-02 DIAGNOSIS — Z79899 Other long term (current) drug therapy: Secondary | ICD-10-CM | POA: Insufficient documentation

## 2019-06-02 DIAGNOSIS — Z793 Long term (current) use of hormonal contraceptives: Secondary | ICD-10-CM | POA: Diagnosis not present

## 2019-06-02 DIAGNOSIS — F3110 Bipolar disorder, current episode manic without psychotic features, unspecified: Secondary | ICD-10-CM | POA: Diagnosis not present

## 2019-06-02 DIAGNOSIS — F312 Bipolar disorder, current episode manic severe with psychotic features: Secondary | ICD-10-CM | POA: Diagnosis present

## 2019-06-02 DIAGNOSIS — Z8249 Family history of ischemic heart disease and other diseases of the circulatory system: Secondary | ICD-10-CM | POA: Diagnosis not present

## 2019-06-02 DIAGNOSIS — Z885 Allergy status to narcotic agent status: Secondary | ICD-10-CM

## 2019-06-02 DIAGNOSIS — F319 Bipolar disorder, unspecified: Secondary | ICD-10-CM | POA: Insufficient documentation

## 2019-06-02 DIAGNOSIS — Z9114 Patient's other noncompliance with medication regimen: Secondary | ICD-10-CM | POA: Diagnosis not present

## 2019-06-02 DIAGNOSIS — Z8349 Family history of other endocrine, nutritional and metabolic diseases: Secondary | ICD-10-CM | POA: Diagnosis not present

## 2019-06-02 DIAGNOSIS — F17210 Nicotine dependence, cigarettes, uncomplicated: Secondary | ICD-10-CM | POA: Diagnosis present

## 2019-06-02 DIAGNOSIS — G4700 Insomnia, unspecified: Secondary | ICD-10-CM | POA: Diagnosis present

## 2019-06-02 DIAGNOSIS — R45851 Suicidal ideations: Secondary | ICD-10-CM | POA: Diagnosis present

## 2019-06-02 DIAGNOSIS — R462 Strange and inexplicable behavior: Secondary | ICD-10-CM

## 2019-06-02 DIAGNOSIS — Z820 Family history of epilepsy and other diseases of the nervous system: Secondary | ICD-10-CM | POA: Diagnosis not present

## 2019-06-02 DIAGNOSIS — F419 Anxiety disorder, unspecified: Secondary | ICD-10-CM | POA: Diagnosis present

## 2019-06-02 DIAGNOSIS — F314 Bipolar disorder, current episode depressed, severe, without psychotic features: Secondary | ICD-10-CM | POA: Diagnosis present

## 2019-06-02 LAB — URINE DRUG SCREEN, QUALITATIVE (ARMC ONLY)
Amphetamines, Ur Screen: NOT DETECTED
Barbiturates, Ur Screen: NOT DETECTED
Benzodiazepine, Ur Scrn: NOT DETECTED
Cannabinoid 50 Ng, Ur ~~LOC~~: NOT DETECTED
Cocaine Metabolite,Ur ~~LOC~~: NOT DETECTED
MDMA (Ecstasy)Ur Screen: NOT DETECTED
Methadone Scn, Ur: NOT DETECTED
Opiate, Ur Screen: NOT DETECTED
Phencyclidine (PCP) Ur S: NOT DETECTED
Tricyclic, Ur Screen: NOT DETECTED

## 2019-06-02 LAB — SARS CORONAVIRUS 2 BY RT PCR (HOSPITAL ORDER, PERFORMED IN ~~LOC~~ HOSPITAL LAB): SARS Coronavirus 2: NEGATIVE

## 2019-06-02 LAB — VALPROIC ACID LEVEL: Valproic Acid Lvl: <10 ug/mL — ABNORMAL LOW (ref 50.0–100.0)

## 2019-06-02 LAB — CBC
HCT: 43 % (ref 36.0–46.0)
Hemoglobin: 14.4 g/dL (ref 12.0–15.0)
MCH: 31.4 pg (ref 26.0–34.0)
MCHC: 33.5 g/dL (ref 30.0–36.0)
MCV: 93.7 fL (ref 80.0–100.0)
Platelets: 351 10*3/uL (ref 150–400)
RBC: 4.59 MIL/uL (ref 3.87–5.11)
RDW: 12.4 % (ref 11.5–15.5)
WBC: 15.2 10*3/uL — ABNORMAL HIGH (ref 4.0–10.5)
nRBC: 0 % (ref 0.0–0.2)

## 2019-06-02 LAB — COMPREHENSIVE METABOLIC PANEL
ALT: 10 U/L (ref 0–44)
AST: 8 U/L — ABNORMAL LOW (ref 15–41)
Albumin: 4.6 g/dL (ref 3.5–5.0)
Alkaline Phosphatase: 74 U/L (ref 38–126)
Anion gap: 9 (ref 5–15)
BUN: 9 mg/dL (ref 6–20)
CO2: 24 mmol/L (ref 22–32)
Calcium: 9.3 mg/dL (ref 8.9–10.3)
Chloride: 106 mmol/L (ref 98–111)
Creatinine, Ser: 0.69 mg/dL (ref 0.44–1.00)
GFR calc Af Amer: >60 mL/min (ref >60)
GFR calc non Af Amer: >60 mL/min (ref >60)
Glucose, Bld: 116 mg/dL — ABNORMAL HIGH (ref 70–99)
Potassium: 3.4 mmol/L — ABNORMAL LOW (ref 3.5–5.1)
Sodium: 139 mmol/L (ref 135–145)
Total Bilirubin: 0.5 mg/dL (ref 0.3–1.2)
Total Protein: 7.7 g/dL (ref 6.5–8.1)

## 2019-06-02 LAB — POCT PREGNANCY, URINE: Preg Test, Ur: NEGATIVE

## 2019-06-02 LAB — LITHIUM LEVEL: Lithium Lvl: <0.06 mmol/L — ABNORMAL LOW (ref 0.60–1.20)

## 2019-06-02 LAB — ETHANOL: Alcohol, Ethyl (B): <10 mg/dL (ref <10)

## 2019-06-02 MED ORDER — MAGNESIUM HYDROXIDE 400 MG/5ML PO SUSP: 30.0000 mL | Freq: Every day | ORAL | Status: DC | PRN

## 2019-06-02 MED ORDER — HYDROXYZINE HCL 25 MG PO TABS
25.0000 mg | ORAL_TABLET | Freq: Three times a day (TID) | ORAL | Status: DC | PRN
  Administered 2019-06-05: 25 mg via ORAL
  Filled 2019-06-02: qty 1

## 2019-06-02 MED ORDER — BUPROPION HCL ER (XL) 150 MG PO TB24
150.0000 mg | ORAL_TABLET | Freq: Every day | ORAL | Status: DC
  Administered 2019-06-03: 09:00:00 150 mg via ORAL
  Filled 2019-06-02 (×2): qty 1

## 2019-06-02 MED ORDER — TRAZODONE HCL 100 MG PO TABS: 200.0000 mg | ORAL_TABLET | Freq: Every day | ORAL | Status: DC

## 2019-06-02 MED ORDER — LITHIUM CARBONATE ER 450 MG PO TBCR
450.0000 mg | EXTENDED_RELEASE_TABLET | Freq: Two times a day (BID) | ORAL | Status: DC
  Administered 2019-06-02: 450 mg via ORAL
  Filled 2019-06-02 (×3): qty 1

## 2019-06-02 MED ORDER — NORGESTIM-ETH ESTRAD TRIPHASIC 0.18/0.215/0.25 MG-35 MCG PO TABS: 1.0000 | ORAL_TABLET | Freq: Every day | ORAL | Status: DC

## 2019-06-02 MED ORDER — ACETAMINOPHEN 325 MG PO TABS
650.0000 mg | ORAL_TABLET | Freq: Four times a day (QID) | ORAL | Status: DC | PRN
  Administered 2019-06-04 – 2019-06-07 (×5): 650 mg via ORAL
  Filled 2019-06-02 (×6): qty 2

## 2019-06-02 MED ORDER — ALUM & MAG HYDROXIDE-SIMETH 200-200-20 MG/5ML PO SUSP: 30.0000 mL | ORAL | Status: DC | PRN

## 2019-06-02 MED ORDER — DIVALPROEX SODIUM 500 MG PO DR TAB
500.0000 mg | DELAYED_RELEASE_TABLET | Freq: Two times a day (BID) | ORAL | Status: DC
  Administered 2019-06-03 – 2019-06-08 (×11): 500 mg via ORAL
  Filled 2019-06-02 (×12): qty 1

## 2019-06-02 MED ORDER — BUPROPION HCL ER (XL) 150 MG PO TB24
150.0000 mg | ORAL_TABLET | Freq: Every day | ORAL | Status: DC
  Administered 2019-06-02: 150 mg via ORAL
  Filled 2019-06-02 (×2): qty 1

## 2019-06-02 MED ORDER — TRAZODONE HCL 100 MG PO TABS
200.0000 mg | ORAL_TABLET | Freq: Every day | ORAL | Status: DC
  Administered 2019-06-02 – 2019-06-07 (×6): 200 mg via ORAL
  Filled 2019-06-02 (×6): qty 2

## 2019-06-02 MED ORDER — DIVALPROEX SODIUM 500 MG PO DR TAB
500.0000 mg | DELAYED_RELEASE_TABLET | Freq: Two times a day (BID) | ORAL | Status: DC
  Administered 2019-06-02: 500 mg via ORAL
  Filled 2019-06-02 (×2): qty 1

## 2019-06-02 NOTE — ED Notes (Signed)
IVC 

## 2019-06-02 NOTE — Tx Team (Signed)
Initial Treatment Plan 06/02/2019 10:41 PM Autumn [name] ETK:[id number]    PATIENT STRESSORS: Financial difficulties Occupational concerns Other: housing   PATIENT STRENGTHS: Communication skills Work skills   PATIENT IDENTIFIED PROBLEMS: Housing issues  Unemployment  Disorganized thinking                 DISCHARGE CRITERIA:  Adequate post-discharge living arrangements Improved stabilization in mood, thinking, and/or behavior Motivation to continue treatment in a less acute level of care Safe-care adequate arrangements made  PRELIMINARY DISCHARGE PLAN: Outpatient therapy Placement in alternative living arrangements  PATIENT/FAMILY INVOLVEMENT: This treatment plan has been presented to and reviewed with the patient, Autumn [name].  The patient has been given the opportunity to ask questions and make suggestions.  Ronelle Nigh, RN 06/02/2019, 10:41 PM

## 2019-06-02 NOTE — ED Notes (Signed)
Dr. Devra Dopp notified of patient refusing medications, no additional orders received.  Patient up in room.  Stating she wishes to call her ride to go home.  Reinforced that she needed to stay in the hospital now until she could be seen and cleared by Psychiatry.  Patient returned to room and to lay back down on her bed.

## 2019-06-02 NOTE — ED Triage Notes (Addendum)
Pt arrived via BPD with IVC paperwork, pt having manic behavior, talking about how she is going to see Jesus and that she was going to get saved today.  On arrival to triage room, patient not responding initially to questions and not speaking to nurse.    Pt did state she is not having any SI.  Per paperwork, pt has not been taking her medications and has been paranoid.
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
## 2019-06-02 NOTE — BH Assessment (Signed)
Patient is to be admitted to [facility].  Attending Physician will be Dr. Weber Cooks.   Patient has been assigned to room 304, by [place] Nurse T'Yawn Javier Glazier).   Intake Paper Work has been signed and placed on patient chart.  ER staff is aware of the admission:  Juliann Pulse, ER Secretary    Dr. Cherylann Banas, ER MD   Opal Sidles, Patient's Nurse   Vikki Ports, Patient Access.

## 2019-06-02 NOTE — ED Notes (Signed)
Pt given lunch tray.

## 2019-06-02 NOTE — Plan of Care (Signed)
Newly admitted and experiencing disturbed thought process. Isolative in room, sad and acting bizarre. No aggressive behaviors. Accepting medications as prescribed.

## 2019-06-02 NOTE — Progress Notes (Signed)
Was discharged from BMU 3 days ago

## 2019-06-02 NOTE — Progress Notes (Signed)
Reports that things were not going well at the boarding house. Unsure about where to go upon discharge.

## 2019-06-02 NOTE — BH Assessment (Signed)
TTS is available via tele.  Please call [phone].
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
## 2019-06-02 NOTE — Consult Note (Signed)
[place] [facility] Consult   Reason for Consult:  Bizarre behavior Referring Physician:  EDP Patient Identification: Autumn [name] MRN:  [id number] Principal Diagnosis: <principal problem not specified> Diagnosis:  Active Problems:   Bipolar I disorder, most recent episode depressed, severe without psychotic features ([place])   Total Time spent with patient: 30 minutes  Subjective:   Autumn [name] is a 43 y.o. female patient reports that she is doing okay today.  She states that the police were called her because she was on her way down the road to go to see her boyfriend and make him go get baptized today.  Patient states that the only reason she was going today is because she had some of her friends talk about getting baptized and how much that helped them and she thought that that is what she needed to do so she was going to see him today.  She states that he probably did not know that she was on her way to his house.  Patient denies any drug use to me.  Patient denies any suicidal or homicidal ideations and denies any hallucinations.  Patient states that she has been taking her medications and made it to her appointment to [facility] when she discharged from the hospital on 05/30/2019.  Patient states that she had to undress in front of the police because she has an [place] apple and she had to prove to them that she was a female and that was the best way to do it.  HPI:  Per Dr. Hendricks Limes y.o. female with a history of bipolar disorder, who states she is been off of her meds.  She has been manic in the past.  \Patient here with manic behavior has not slept in a week, states she has been sniffing heroin off and on with no SI or HI, she was undressing in front of the police and she has been acting paranoid.  She states that she called 911 to report that her parents were dead because "that is how [place] wants it".  The patient then told me that her father died several years ago and  her mother is currently alive.  She has been having hyperreligiosity. Patient under IVC.  No physical complaints no complaints of overdose.   Patient is seen by me face-to-face.  Patient is sitting in her bed calmly and has normal speech.  Patient's reports have varied from speaking with the EDP and with me about drug use and medications.  Patient was just recently discharged from [facility] and was supposed to follow-up at [facility] behavioral at 945 on 05/30/2019.  Unsure if patient went to the appointment at this time and unsure if patient has continued her medications that she was instructed to do.  Patient does tend to have some bizarre and delusional thoughts.  We will restart patient's medications that she was discharged on from the BMU.  We will also check patient's valproic acid level as well as lithium level.  Feel that monitoring patient overnight to establish if this is due to her reported heroin use or if the patient has been noncompliant with medications would be beneficial to the patient before admitting again.  Past Psychiatric History: past Dx: bipolar disorder. Several psych admissions. Denies suicidal attempts. Reports non-suicidal self-injurious behaviors. Questionable med-compliance.  Risk to Self:   Risk to Others:   Prior Inpatient Therapy:   Prior Outpatient Therapy:    Past Medical History:  Past Medical History:  Diagnosis Date  .  Allergy    Seasonal, Ultram (seizures)  . Anemia 2005  . Bipolar 1 disorder ([place])   . Glaucoma   . Seizures ([place]) 2008    Past Surgical History:  Procedure Laterality Date  . BREAST BIOPSY Right    x 2  . CESAREAN SECTION     x 2   Family History:  Family History  Problem Relation Age of Onset  . Hypertension Mother   . Hyperlipidemia Mother   . Parkinson's disease Father    Family Psychiatric  History: None reported Social History:  Social History   Substance and Sexual Activity  Alcohol Use Yes  . Alcohol/week: 4.0 standard  drinks  . Types: 4 Cans of beer per week   Comment: Ocassional     Social History   Substance and Sexual Activity  Drug Use Yes  . Types: Marijuana   Comment: percocet -last use over 1 week ago. Heroin-last used 2/11    Social History   Socioeconomic History  . Marital status: Single    Spouse name: Not on file  . Number of children: Not on file  . Years of education: Not on file  . Highest education level: Not on file  Occupational History  . Not on file  Social Needs  . Financial resource strain: Not on file  . Food insecurity    Worry: Not on file    Inability: Not on file  . Transportation needs    Medical: Not on file    Non-medical: Not on file  Tobacco Use  . Smoking status: Current Every Day Smoker    Packs/day: 1.00    Years: 9.00    Pack years: 9.00  . Smokeless tobacco: Never Used  . Tobacco comment: Patient not interested  Substance and Sexual Activity  . Alcohol use: Yes    Alcohol/week: 4.0 standard drinks    Types: 4 Cans of beer per week    Comment: Ocassional  . Drug use: Yes    Types: Marijuana    Comment: percocet -last use over 1 week ago. Heroin-last used 2/11  . Sexual activity: Yes    Birth control/protection: None  Lifestyle  . Physical activity    Days per week: Not on file    Minutes per session: Not on file  . Stress: Not on file  Relationships  . Social Herbalist on phone: Not on file    Gets together: Not on file    Attends religious service: Not on file    Active member of club or organization: Not on file    Attends meetings of clubs or organizations: Not on file    Relationship status: Not on file  Other Topics Concern  . Not on file  Social History Narrative  . Not on file   Additional Social History:    Allergies:   Allergies  Allergen Reactions  . Haldol [Haloperidol Lactate]     Patient states that she gets stiff muscles when taking Haldol  . Ultram [Tramadol] Other (See Comments)    Seizures     Labs:  Results for orders placed or performed during the hospital encounter of 06/02/19 (from the past 48 hour(s))  Comprehensive metabolic panel     Status: Abnormal   Collection Time: 06/02/19 10:39 AM  Result Value Ref Range   Sodium 139 135 - 145 mmol/L   Potassium 3.4 (L) 3.5 - 5.1 mmol/L   Chloride 106 98 - 111 mmol/L   CO2  24 22 - 32 mmol/L   Glucose, Bld 116 (H) 70 - 99 mg/dL   BUN 9 6 - 20 mg/dL   Creatinine, Ser 0.69 0.44 - 1.00 mg/dL   Calcium 9.3 8.9 - 10.3 mg/dL   Total Protein 7.7 6.5 - 8.1 g/dL   Albumin 4.6 3.5 - 5.0 g/dL   AST 8 (L) 15 - 41 U/L   ALT 10 0 - 44 U/L   Alkaline Phosphatase 74 38 - 126 U/L   Total Bilirubin 0.5 0.3 - 1.2 mg/dL   GFR calc non Af Amer >60 >60 mL/min   GFR calc Af Amer >60 >60 mL/min   Anion gap 9 5 - 15    Comment: Performed at [facility], [address] [place] [address] [place], [place] [address]  Ethanol     Status: None   Collection Time: 06/02/19 10:39 AM  Result Value Ref Range   Alcohol, Ethyl (B) <10 <10 mg/dL    Comment: (NOTE) Lowest detectable limit for serum alcohol is 10 mg/dL. For medical purposes only. Performed at [facility], [address] [place], [place] [address]   cbc     Status: Abnormal   Collection Time: 06/02/19 10:39 AM  Result Value Ref Range   WBC 15.2 (H) 4.0 - 10.5 K/uL   RBC 4.59 3.87 - 5.11 MIL/uL   Hemoglobin 14.4 12.0 - 15.0 g/dL   HCT 43.0 36.0 - 46.0 %   MCV 93.7 80.0 - 100.0 fL   MCH 31.4 26.0 - 34.0 pg   MCHC 33.5 30.0 - 36.0 g/dL   RDW 12.4 11.5 - 15.5 %   Platelets 351 150 - 400 K/uL   nRBC 0.0 0.0 - 0.2 %    Comment: Performed at [facility], [address], [place], [place] [address]  Urine Drug Screen, Qualitative     Status: None   Collection Time: 06/02/19 10:39 AM  Result Value Ref Range   Tricyclic, Ur Screen NONE DETECTED NONE DETECTED   Amphetamines, Ur Screen NONE DETECTED NONE DETECTED   MDMA (Ecstasy)Ur Screen NONE DETECTED NONE DETECTED    Cocaine Metabolite,Ur [place] NONE DETECTED NONE DETECTED   Opiate, Ur Screen NONE DETECTED NONE DETECTED   Phencyclidine (PCP) Ur S NONE DETECTED NONE DETECTED   Cannabinoid 50 Ng, Ur  NONE DETECTED NONE DETECTED   Barbiturates, Ur Screen NONE DETECTED NONE DETECTED   Benzodiazepine, Ur Scrn NONE DETECTED NONE DETECTED   Methadone Scn, Ur NONE DETECTED NONE DETECTED    Comment: (NOTE) Tricyclics + metabolites, urine    Cutoff 1000 ng/mL Amphetamines + metabolites, urine  Cutoff 1000 ng/mL MDMA (Ecstasy), urine              Cutoff 500 ng/mL Cocaine Metabolite, urine          Cutoff 300 ng/mL Opiate + metabolites, urine        Cutoff 300 ng/mL Phencyclidine (PCP), urine         Cutoff 25 ng/mL Cannabinoid, urine                 Cutoff 50 ng/mL Barbiturates + metabolites, urine  Cutoff 200 ng/mL Benzodiazepine, urine              Cutoff 200 ng/mL Methadone, urine                   Cutoff 300 ng/mL The urine drug screen provides only a preliminary, unconfirmed analytical test result and should not be used  for non-medical purposes. Clinical consideration and professional judgment should be applied to any positive drug screen result due to possible interfering substances. A more specific alternate chemical method must be used in order to obtain a confirmed analytical result. Gas chromatography / mass spectrometry (GC/MS) is the preferred confirmat ory method. Performed at [facility], [address] [place], [place] [address]   Pregnancy, urine POC     Status: None   Collection Time: 06/02/19 10:44 AM  Result Value Ref Range   Preg Test, Ur NEGATIVE NEGATIVE    Comment:        THE SENSITIVITY OF THIS METHODOLOGY IS >24 mIU/mL     Current Facility-Administered Medications  Medication Dose Route Frequency Provider Last Rate Last Dose  . buPROPion (WELLBUTRIN XL) 24 hr tablet 150 mg  150 mg Oral Daily Aviella Disbrow B, FNP      . divalproex (DEPAKOTE) DR tablet 500 mg  500  mg Oral Q12H Amatullah Christy, Darnelle Maffucci B, FNP      . lithium carbonate (ESKALITH) CR tablet 450 mg  450 mg Oral Q12H Nayan Proch, Lowry Ram, FNP      . Norgestimate-Ethinyl Estradiol Triphasic 0.18/0.215/0.25 MG-35 MCG tablet 1 tablet  1 tablet Oral Daily Lind Ausley B, FNP      . traZODone (DESYREL) tablet 200 mg  200 mg Oral QHS Ayianna Darnold, Lowry Ram, FNP       Current Outpatient Medications  Medication Sig Dispense Refill  . buPROPion (WELLBUTRIN XL) 150 MG 24 hr tablet Take 1 tablet (150 mg total) by mouth daily. 7 tablet 0  . divalproex (DEPAKOTE) 500 MG DR tablet Take 1 tablet (500 mg total) by mouth every 12 (twelve) hours. 14 tablet 0  . lithium carbonate (ESKALITH) 450 MG CR tablet Take 1 tablet (450 mg total) by mouth every 12 (twelve) hours. 14 tablet 0  . Norgestimate-Ethinyl Estradiol Triphasic (ORTHO TRI-CYCLEN, 28,) 0.18/0.215/0.25 MG-35 MCG tablet Take 1 tablet by mouth daily.    . traZODone (DESYREL) 100 MG tablet Take 2 tablets (200 mg total) by mouth at bedtime. 14 tablet 0    Musculoskeletal: Strength & Muscle Tone: within normal limits Gait & Station: normal Patient leans: N/A  Psychiatric Specialty Exam: Physical Exam  Nursing note and vitals reviewed. Constitutional: She appears well-developed and well-nourished.  Respiratory: Effort normal.  Musculoskeletal: Normal range of motion.  Neurological: She is alert.    Review of Systems  Constitutional: Negative.   HENT: Negative.   Eyes: Negative.   Respiratory: Negative.   Cardiovascular: Negative.   Gastrointestinal: Negative.   Genitourinary: Negative.   Musculoskeletal: Negative.   Skin: Negative.   Neurological: Negative.   Endo/Heme/Allergies: Negative.   Psychiatric/Behavioral:       Delusional and bizarre behavior    Blood pressure 126/87, pulse (!) 104, temperature 98.5 F (36.9 C), temperature source Oral, resp. rate 18, height 5\' 2"  (1.575 m), weight 52.2 kg, SpO2 97 %.Body mass index is 21.05 kg/m.  General  Appearance: Disheveled  Eye Contact:  Good  Speech:  Clear and Coherent and Normal Rate  Volume:  Decreased  Mood:  Depressed  Affect:  Flat  Thought Process:  Disorganized and Descriptions of Associations: Loose  Orientation:  Full (Time, Place, and Person)  Thought Content:  Delusions  Suicidal Thoughts:  No  Homicidal Thoughts:  No  Memory:  Immediate;   Good Recent;   Fair Remote;   Fair  Judgement:  Impaired  Insight:  Lacking  Psychomotor Activity:  Normal  Concentration:  Concentration: Good  Recall:  Good  Fund of Knowledge:  Fair  Language:  Good  Akathisia:  No  Handed:  Right  AIMS (if indicated):     Assets:  Communication Skills Desire for Improvement Resilience Social Support  ADL's:  Intact  Cognition:  WNL  Sleep:        Treatment Plan Summary: Daily contact with patient to assess and evaluate symptoms and progress in treatment and Medication management  Restart home medications: Wellbutrin XL 150 mg PO Daily Depakote DR 500 mg PO Q12H Lithium Carbonate 450 mg PO Q12H Trazodone 200 mg PO QHS Ordered Valproic Acid and Lithium levels  Disposition: Observe overnight for stability and medication management  Lewis Shock, FNP 06/02/2019 12:09 PM

## 2019-06-02 NOTE — Progress Notes (Signed)
Patient does not remember which vaccines she has received

## 2019-06-02 NOTE — ED Notes (Signed)
Pt dressed out into burgundy scrubs. Pts belongings include: white bra, blue t shirt, black underwear, pair of socks, pink pants, pair of shoes.  Pts belongings put in bag and labeled.

## 2019-06-02 NOTE — ED Notes (Signed)
Pt. Sleeping in bed in room.

## 2019-06-02 NOTE — ED Provider Notes (Addendum)
[facility] Emergency Department Provider Note  ____________________________________________   I have reviewed the triage vital signs and the nursing notes. Where available I have reviewed prior notes and, if possible and indicated, outside hospital notes.   Patient seen and evaluated during the coronavirus epidemic during a time with low staffing  Patient seen for the symptoms described in the history of present illness. She was evaluated in the context of the global COVID-19 pandemic, which necessitated consideration that the patient might be at risk for infection with the SARS-CoV-2 virus that causes COVID-19. Institutional protocols and algorithms that pertain to the evaluation of patients at risk for COVID-19 are in a state of rapid change based on information released by regulatory bodies including the CDC and federal and state organizations. These policies and algorithms were followed during the patient's care in the ED.    HISTORY  Chief Complaint Psychiatric Evaluation    HPI Autumn [name] is a 43 y.o. female  With a history of  bipolar disorder, who states she is been off of her meds.  She has been manic in the past.  \Patient here with manic behavior has not slept in a week, states she has been sniffing heroin off and on with no SI or HI, she was undressing in front of the police and she has been acting paranoid.  She states that she called 911 to report that her parents were dead because "that is how [place] wants it".  The patient then told me that her father died several years ago and her mother is currently alive.  She has been having hyperreligiosity. Patient under IVC.  No physical complaints no complaints of overdose.  Past Medical History:  Diagnosis Date  . Allergy    Seasonal, Ultram (seizures)  . Anemia 2005  . Bipolar 1 disorder ([place])   . Glaucoma   . Seizures ([place]) 2008    Patient Active Problem List   Diagnosis Date Noted  .  Affective psychosis, bipolar ([place]) 05/25/2019  . Bipolar affective disorder, current episode manic ([place]) 05/20/2019  . Opiate abuse, continuous ([place]) 11/21/2018  . Bipolar I disorder, most recent episode depressed, severe without psychotic features ([place]) 07/05/2017  . Tobacco use disorder 06/27/2017  . Amphetamine use disorder, severe ([place]) 06/27/2017  . Opioid use disorder, severe, dependence ([place]) 06/27/2017  . Cannabis use disorder, severe, dependence ([place]) 06/27/2017  . Hx of glaucoma 10/02/2013    Past Surgical History:  Procedure Laterality Date  . BREAST BIOPSY Right    x 2  . CESAREAN SECTION     x 2    Prior to Admission medications   Medication Sig Start Date End Date Taking? Authorizing Provider  buPROPion (WELLBUTRIN XL) 150 MG 24 hr tablet Take 1 tablet (150 mg total) by mouth daily. 05/29/19   Money, Lowry Ram, FNP  divalproex (DEPAKOTE) 500 MG DR tablet Take 1 tablet (500 mg total) by mouth every 12 (twelve) hours. 05/29/19   Money, Lowry Ram, FNP  lithium carbonate (ESKALITH) 450 MG CR tablet Take 1 tablet (450 mg total) by mouth every 12 (twelve) hours. 05/29/19   Money, Lowry Ram, FNP  Norgestimate-Ethinyl Estradiol Triphasic (ORTHO TRI-CYCLEN, 28,) 0.18/0.215/0.25 MG-35 MCG tablet Take 1 tablet by mouth daily.    [provider]  traZODone (DESYREL) 100 MG tablet Take 2 tablets (200 mg total) by mouth at bedtime. 05/29/19   Money, Lowry Ram, FNP    Allergies Haldol [haloperidol lactate] and Ultram [tramadol]  Family  History  Problem Relation Age of Onset  . Hypertension Mother   . Hyperlipidemia Mother   . Parkinson's disease Father     Social History Social History   Tobacco Use  . Smoking status: Current Every Day Smoker    Packs/day: 1.00    Years: 9.00    Pack years: 9.00  . Smokeless tobacco: Never Used  . Tobacco comment: Patient not interested  Substance Use Topics  . Alcohol use: Yes    Alcohol/week: 4.0 standard drinks    Types: 4 Cans  of beer per week    Comment: Ocassional  . Drug use: Yes    Types: Marijuana    Comment: percocet -last use over 1 week ago. Heroin-last used 2/11    Review of Systems Constitutional: No fever/chills Eyes: No visual changes. ENT: No sore throat. No stiff neck no neck pain Cardiovascular: Denies chest pain. Respiratory: Denies shortness of breath. Gastrointestinal:   no vomiting.  No diarrhea.  No constipation. Genitourinary: Negative for dysuria. Musculoskeletal: Negative lower extremity swelling Skin: Negative for rash. Neurological: Negative for severe headaches, focal weakness or numbness.   ____________________________________________   PHYSICAL EXAM:  VITAL SIGNS: ED Triage Vitals  Enc Vitals Group     BP 06/02/19 1023 126/87     Pulse Rate 06/02/19 1023 (!) 104     Resp 06/02/19 1023 18     Temp 06/02/19 1023 98.5 F (36.9 C)     Temp Source 06/02/19 1023 Oral     SpO2 06/02/19 1023 97 %     Weight 06/02/19 1024 115 lb 1.3 oz (52.2 kg)     Height 06/02/19 1024 5\' 2"  (1.575 m)     Head Circumference --      Peak Flow --      Pain Score 06/02/19 1024 0     Pain Loc --      Pain Edu? --      Excl. in [place]? --     Constitutional: Alert and oriented but with bizarre affect. Well appearing and in no acute distress flight of ideas and pressured speech noted. Eyes: Conjunctivae are normal Head: Atraumatic HEENT: No congestion/rhinnorhea. Mucous membranes are moist.  Oropharynx non-erythematous Neck:   Nontender with no meningismus, no masses, no stridor Cardiovascular: Normal rate, regular rhythm. Grossly normal heart sounds.  Good peripheral circulation. Respiratory: Normal respiratory effort.  No retractions. Lungs CTAB. Abdominal: Soft and nontender. No distention. No guarding no rebound Back:  There is no focal tenderness or step off.  there is no midline tenderness there are no lesions noted. there is no CVA tenderness  Musculoskeletal: No lower extremity  tenderness, no upper extremity tenderness. No joint effusions, no DVT signs strong distal pulses no edema Neurologic:  Normal speech and language. No gross focal neurologic deficits are appreciated.  Skin:  Skin is warm, dry and intact. No rash noted. Psychiatric: Mood and affect are bizarre  ____________________________________________   LABS (all labs ordered are listed, but only abnormal results are displayed)  Labs Reviewed  COMPREHENSIVE METABOLIC PANEL - Abnormal; Notable for the following components:      Result Value   Potassium 3.4 (*)    Glucose, Bld 116 (*)    AST 8 (*)    All other components within normal limits  CBC - Abnormal; Notable for the following components:   WBC 15.2 (*)    All other components within normal limits  SARS CORONAVIRUS 2 (HOSPITAL ORDER, [facility])  ETHANOL  URINE DRUG SCREEN, QUALITATIVE ([facility] ONLY)  POC URINE PREG, ED  POCT PREGNANCY, URINE    Pertinent labs  results that were available during my care of the patient were reviewed by me and considered in my medical decision making (see chart for details). ____________________________________________  EKG  I personally interpreted any EKGs ordered by me or triage  ____________________________________________  RADIOLOGY  Pertinent labs & imaging results that were available during my care of the patient were reviewed by me and considered in my medical decision making (see chart for details). If possible, patient and/or family made aware of any abnormal findings.  No results found. ____________________________________________    PROCEDURES  Procedure(s) performed: None  Procedures  Critical Care performed: None  ____________________________________________   INITIAL IMPRESSION / ASSESSMENT AND PLAN / ED COURSE  Pertinent labs & imaging results that were available during my care of the patient were reviewed by me and considered in my medical decision  making (see chart for details).   Patient here with manic behavior, also admits to using heroin.  Patient in no acute distress, no evidence of toxidrome.  Has a history of mania in the past.  Under IVC which I have verified.  Awaiting psychiatric intervention.  ----------------------------------------- 4:19 PM on 06/02/2019 -----------------------------------------  Signed out to dr. Cherylann Banas at the end of my shift.    ____________________________________________   FINAL CLINICAL IMPRESSION(S) / ED DIAGNOSES  Final diagnoses:  None      This chart was dictated using voice recognition software.  Despite best efforts to proofread,  errors can occur which can change meaning.       Schuyler Amor, MD 06/02/19 1146    Schuyler Amor, MD 06/02/19 1620

## 2019-06-02 NOTE — Progress Notes (Addendum)
Patient ID: Autumn [name], female   DOB: June 17, 1976, 43 y.o.   MRN: [id number] Patient presents involuntarily secondary to increased disorganized thinking and behaviors. Was recently discharged from this unit. Patient is convinced that she had 2 babies right after discharge and they are currently "with their daddies". Patient reports that she was living at a boarding house and "things did not go well there". Currently unsure of where she will live once discharged from the hospital. Patient is disheveled with poor hygiene reporting that she has not been able to shower since she left the hospital but not willing to shower now. Patient's thought process is unclear. She denies thoughts of self harm. Denies hallucinations but delusional.  Skin assessment reveals no issues. This is witnessed by nurse Ples Specter., RN. Patient was reoriented to the unit and safety precautions initiated.

## 2019-06-02 NOTE — ED Notes (Signed)
Patient in room, laying on bed.  Offered patient her medications, patient refused stating "I need to protect my child from the apocalypse"

## 2019-06-03 DIAGNOSIS — F3110 Bipolar disorder, current episode manic without psychotic features, unspecified: Secondary | ICD-10-CM

## 2019-06-03 LAB — TSH: TSH: 2.35 u[IU]/mL (ref 0.350–4.500)

## 2019-06-03 MED ORDER — NICOTINE 14 MG/24HR TD PT24
14.0000 mg | MEDICATED_PATCH | Freq: Every day | TRANSDERMAL | Status: DC
  Administered 2019-06-03 – 2019-06-08 (×6): 14 mg via TRANSDERMAL
  Filled 2019-06-03 (×6): qty 1

## 2019-06-03 MED ORDER — LITHIUM CARBONATE ER 300 MG PO TBCR
300.0000 mg | EXTENDED_RELEASE_TABLET | Freq: Two times a day (BID) | ORAL | Status: DC
  Administered 2019-06-03 – 2019-06-04 (×3): 300 mg via ORAL
  Filled 2019-06-03 (×3): qty 1

## 2019-06-03 MED ORDER — LORAZEPAM 1 MG PO TABS: 1.0000 mg | ORAL_TABLET | ORAL | Status: DC | PRN

## 2019-06-03 MED ORDER — OLANZAPINE 5 MG PO TBDP: 10.0000 mg | ORAL_TABLET | Freq: Three times a day (TID) | ORAL | Status: DC | PRN

## 2019-06-03 MED ORDER — ZIPRASIDONE MESYLATE 20 MG IM SOLR: 20.0000 mg | INTRAMUSCULAR | Status: DC | PRN

## 2019-06-03 NOTE — Plan of Care (Signed)
D- Patient alert and oriented. Patient presents in a pleasant mood on assessment stating that she slept ok last night and had no complaints to voice to this Probation officer. Patient stated to this writer that her depression/anxiety is "not too bad". Patient denies SI, HI, AVH, and pain at this time. Patient's goal for today is "learning better coping skills to adapt to living with depression and mood disorder", in which she will "attend groups" in order to accomplish her goal.  A- Scheduled medications administered to patient, per MD orders. Support and encouragement provided.  Routine safety checks conducted every 15 minutes.  Patient informed to notify staff with problems or concerns.  R- No adverse drug reactions noted. Patient contracts for safety at this time. Patient compliant with medications and treatment plan. Patient receptive, calm, and cooperative. Patient interacts well with others on the unit.  Patient remains safe at this time.  Problem: Education: Goal: Ability to state activities that reduce stress will improve Outcome: Progressing   Problem: Self-Concept: Goal: Level of anxiety will decrease Outcome: Progressing   Problem: Education: Goal: Utilization of techniques to improve thought processes will improve Outcome: Progressing   Problem: Education: Goal: Knowledge of [place] [facility] Education information/materials will improve Outcome: Progressing Goal: Emotional status will improve Outcome: Progressing Goal: Mental status will improve Outcome: Progressing Goal: Verbalization of understanding the information provided will improve Outcome: Progressing   Problem: Activity: Goal: Interest or engagement in activities will improve Outcome: Progressing Goal: Sleeping patterns will improve Outcome: Progressing   Problem: Coping: Goal: Ability to verbalize frustrations and anger appropriately will improve Outcome: Progressing Goal: Ability to demonstrate self-control will  improve Outcome: Progressing   Problem: Health Behavior/Discharge Planning: Goal: Identification of resources available to assist in meeting health care needs will improve Outcome: Progressing Goal: Compliance with treatment plan for underlying cause of condition will improve Outcome: Progressing   Problem: Physical Regulation: Goal: Ability to maintain clinical measurements within normal limits will improve Outcome: Progressing   Problem: Safety: Goal: Periods of time without injury will increase Outcome: Progressing   Problem: Education: Goal: Knowledge of the prescribed therapeutic regimen will improve Outcome: Progressing   Problem: Coping: Goal: Coping ability will improve Outcome: Progressing

## 2019-06-03 NOTE — [facility] Group Notes (Signed)
LCSW Group Therapy Note 06/03/2019 1:15pm  Type of Therapy and Topic: Group Therapy: Feelings Around Returning Home & Establishing a Supportive Framework and Supporting Oneself When Supports Not Available  Participation Level: Active  Description of Group:  Patients first processed thoughts and feelings about upcoming discharge. These included fears of upcoming changes, lack of change, new living environments, judgements and expectations from others and overall stigma of mental health issues. The group then discussed the definition of a supportive framework, what that looks and feels like, and how do to discern it from an unhealthy non-supportive network. The group identified different types of supports as well as what to do when your family/friends are less than helpful or unavailable  Therapeutic Goals  1. Patient will identify one healthy supportive network that they can use at discharge. 2. Patient will identify one factor of a supportive framework and how to tell it from an unhealthy network. 3. Patient able to identify one coping skill to use when they do not have positive supports from others. 4. Patient will demonstrate ability to communicate their needs through discussion and/or role plays.  Summary of Patient Progress:  The patient reported she feels "sick." Pt engaged during group session. As patients processed their anxiety about discharge and described healthy supports patient shared she is ready to be discharge. She stated, "I realized it was a bad decision." Patient listed her boyfriend as her main support.  Patients identified at least one self-care tool they were willing to use after discharge.   Therapeutic Modalities Cognitive Behavioral Therapy Motivational Interviewing   Autumn Wheller  CUEBAS-COLON, LCSW 06/03/2019 9:08 AM

## 2019-06-03 NOTE — Plan of Care (Signed)
Isolative in room, mostly in bed. Reporting that she just wants to rest. Otherwise cooperative and compliant with medication. Appetite is fair: patient able to eat at least half of her meals.

## 2019-06-03 NOTE — Progress Notes (Signed)
Patient slept throughout the night. Without any sign of discomfort. Woke up on time for vital signs. Complete self inventory as well as admission  Forms. She remains flat and depressed but report that she got good sleep. Waiting to be evaluated by attending MD. Safety monitored per Q 15 mn.

## 2019-06-03 NOTE — H&P (Signed)
Psychiatric Admission Assessment Adult  Patient Identification: Autumn [name] MRN:  [id number] Date of Evaluation:  06/03/2019 Chief Complaint:  Bi polar disorder Principal Diagnosis: <principal problem not specified> Diagnosis:  Active Problems:   Bipolar I disorder, most recent episode (or current) manic ([place])  History of Present Illness: Patient is seen and examined.  Patient is a 43 year old female with a reported past psychiatric history significant for bipolar disorder type I.  The patient had recently been hospitalized here, and was discharged on 7/22 with follow-up at a local clinic.  She was discharged on Wellbutrin XL 150 mg p.o. daily, Depakote DR 500 mg p.o. every 12 hours, lithium carbonate CR 450 mg p.o. every 12 hours, and trazodone 200 mg p.o. nightly.  She presented to the [name] emergency department on 06/02/2019.  She stated that that time that she was currently pregnant and found out she was pregnant on her way to the hospital.  She reported her last use of cocaine was 4 years prior to admission, and reported using Xanax previously.  She also reported that she had been off for several days, and had not been sleeping.  In the emergency department she also admitted to sniffing heroin on and off.  She had been undressing in front of police and been acting paranoid as well.  She apparently is clear now, and stated that she had been discharged from the hospital and went to her appointment at [facility].  She stated there they told her to stop the lithium, Depakote and Wellbutrin.  They gave her Trintellix samples.  She stated that she was very anxious and took the Trintellix, but Taking multiple tablets of Trintellix because she did not feel it was working.  She was admitted to the hospital for evaluation and stabilization.  Review of her laboratories revealed a mildly elevated white blood cell count at 15.2.  Her pregnancy test was negative.  Urinalysis was  abnormal, but no bacteria were present.  It was a contaminated sample with 11-20 squamous epithelial cells.  Drug screen was negative.  Associated Signs/Symptoms: Depression Symptoms:  anhedonia, insomnia, psychomotor agitation, fatigue, feelings of worthlessness/guilt, hopelessness, suicidal thoughts without plan, anxiety, loss of energy/fatigue, disturbed sleep, (Hypo) Manic Symptoms:  Impulsivity, Irritable Mood, Labiality of Mood, Anxiety Symptoms:  Excessive Worry, Psychotic Symptoms:  Hallucinations: Auditory Paranoia, PTSD Symptoms: Negative Total Time spent with patient: 30 minutes  Past Psychiatric History: Patient was just discharged from our facility on 7/22.  She has a previous diagnosis of bipolar disorder.  She has had multiple psychiatric hospitalizations besides that.  Prior to her discharge on 05/30/2019 her last admission here was in January 2020.  She has had para suicidal ideation multiple times.  She stated that the [facility] clinic told her to stop her medicines, but it is unclear with the validity of that statement.  Is the patient at risk to self? Yes.    Has the patient been a risk to self in the past 6 months? Yes.    Has the patient been a risk to self within the distant past? Yes.    Is the patient a risk to others? No.  Has the patient been a risk to others in the past 6 months? No.  Has the patient been a risk to others within the distant past? No.   Prior Inpatient Therapy:   Prior Outpatient Therapy:    Alcohol Screening: 1. How often do you have a drink containing alcohol?: Never 2. How  many drinks containing alcohol do you have on a typical day when you are drinking?: 1 or 2 3. How often do you have six or more drinks on one occasion?: Never AUDIT-C Score: 0 5. How often during the last year have you failed to do what was normally expected from you becasue of drinking?: Never 6. How often during the last year have you needed a first drink in the  morning to get yourself going after a heavy drinking session?: Never 7. How often during the last year have you had a feeling of guilt of remorse after drinking?: Never 8. How often during the last year have you been unable to remember what happened the night before because you had been drinking?: Never 9. Have you or someone else been injured as a result of your drinking?: No 10. Has a relative or friend or a doctor or another health worker been concerned about your drinking or suggested you cut down?: No Alcohol Use Disorder Identification Test Final Score (AUDIT): 0 Alcohol Brief Interventions/Follow-up: Continued Monitoring Substance Abuse History in the last 12 months:  Yes.   Consequences of Substance Abuse: Negative Previous Psychotropic Medications: Yes  Psychological Evaluations: Yes  Past Medical History:  Past Medical History:  Diagnosis Date  . Allergy    Seasonal, Ultram (seizures)  . Anemia 2005  . Bipolar 1 disorder ([place])   . Glaucoma   . Seizures ([place]) 2008    Past Surgical History:  Procedure Laterality Date  . BREAST BIOPSY Right    x 2  . CESAREAN SECTION     x 2   Family History:  Family History  Problem Relation Age of Onset  . Hypertension Mother   . Hyperlipidemia Mother   . Parkinson's disease Father    Family Psychiatric  History: Noncontributory Tobacco Screening: Have you used any form of tobacco in the last 30 days? (Cigarettes, Smokeless Tobacco, Cigars, and/or Pipes): Yes Tobacco use, Select all that apply: 5 or more cigarettes per day Are you interested in Tobacco Cessation Medications?: No, patient refused Counseled patient on smoking cessation including recognizing danger situations, developing coping skills and basic information about quitting provided: Yes Social History:  Social History   Substance and Sexual Activity  Alcohol Use Yes  . Alcohol/week: 4.0 standard drinks  . Types: 4 Cans of beer per week   Comment: Ocassional      Social History   Substance and Sexual Activity  Drug Use Yes  . Types: Marijuana   Comment: percocet -last use over 1 week ago. Heroin-last used 2/11    Additional Social History:      History of alcohol / drug use?: No history of alcohol / drug abuse                    Allergies:   Allergies  Allergen Reactions  . Haldol [Haloperidol Lactate]     Patient states that she gets stiff muscles when taking Haldol  . Ultram [Tramadol] Other (See Comments)    Seizures   Lab Results:  Results for orders placed or performed during the hospital encounter of 06/02/19 (from the past 48 hour(s))  TSH     Status: None   Collection Time: 06/02/19 10:39 AM  Result Value Ref Range   TSH 2.350 0.350 - 4.500 uIU/mL    Comment: Performed by a 3rd Generation assay with a functional sensitivity of <=0.01 uIU/mL. Performed at [facility], [address], [place], [place] [address]  Blood Alcohol level:  Lab Results  Component Value Date   ETH <10 06/02/2019   ETH <10 24/82/5003    Metabolic Disorder Labs:  Lab Results  Component Value Date   HGBA1C 5.0 11/21/2018   MPG 96.8 11/21/2018   MPG 93.93 04/13/2018   No results found for: PROLACTIN Lab Results  Component Value Date   CHOL 196 11/21/2018   TRIG 97 11/21/2018   HDL 53 11/21/2018   CHOLHDL 3.7 11/21/2018   VLDL 19 11/21/2018   LDLCALC 124 (H) 11/21/2018   LDLCALC 117 (H) 04/13/2018    Current Medications: Current Facility-Administered Medications  Medication Dose Route Frequency Provider Last Rate Last Dose  . acetaminophen (TYLENOL) tablet 650 mg  650 mg Oral Q6H PRN Money, Lowry Ram, FNP      . alum & mag hydroxide-simeth (MAALOX/MYLANTA) 200-200-20 MG/5ML suspension 30 mL  30 mL Oral Q4H PRN Money, Darnelle Maffucci B, FNP      . buPROPion (WELLBUTRIN XL) 24 hr tablet 150 mg  150 mg Oral Daily Money, Travis B, FNP   150 mg at 06/03/19 0830  . divalproex (DEPAKOTE) DR tablet 500 mg  500 mg Oral Q12H Money,  Darnelle Maffucci B, FNP   500 mg at 06/03/19 0830  . hydrOXYzine (ATARAX/VISTARIL) tablet 25 mg  25 mg Oral TID PRN Money, Lowry Ram, FNP      . lithium carbonate (LITHOBID) CR tablet 300 mg  300 mg Oral Q12H Sharma Covert, MD   300 mg at 06/03/19 0936  . OLANZapine zydis (ZYPREXA) disintegrating tablet 10 mg  10 mg Oral Q8H PRN Sharma Covert, MD       And  . LORazepam (ATIVAN) tablet 1 mg  1 mg Oral PRN Sharma Covert, MD       And  . ziprasidone (GEODON) injection 20 mg  20 mg Intramuscular PRN Sharma Covert, MD      . magnesium hydroxide (MILK OF MAGNESIA) suspension 30 mL  30 mL Oral Daily PRN Money, Darnelle Maffucci B, FNP      . nicotine (NICODERM CQ - dosed in mg/24 hours) patch 14 mg  14 mg Transdermal Daily Sharma Covert, MD   14 mg at 06/03/19 0936  . traZODone (DESYREL) tablet 200 mg  200 mg Oral QHS Money, Lowry Ram, FNP   200 mg at 06/02/19 2153   PTA Medications: Medications Prior to Admission  Medication Sig Dispense Refill Last Dose  . buPROPion (WELLBUTRIN XL) 150 MG 24 hr tablet Take 1 tablet (150 mg total) by mouth daily. 7 tablet 0   . divalproex (DEPAKOTE) 500 MG DR tablet Take 1 tablet (500 mg total) by mouth every 12 (twelve) hours. 14 tablet 0   . lithium carbonate (ESKALITH) 450 MG CR tablet Take 1 tablet (450 mg total) by mouth every 12 (twelve) hours. 14 tablet 0   . Norgestimate-Ethinyl Estradiol Triphasic (ORTHO TRI-CYCLEN, 28,) 0.18/0.215/0.25 MG-35 MCG tablet Take 1 tablet by mouth daily.     . traZODone (DESYREL) 100 MG tablet Take 2 tablets (200 mg total) by mouth at bedtime. 14 tablet 0     Musculoskeletal: Strength & Muscle Tone: within normal limits Gait & Station: normal Patient leans: N/A  Psychiatric Specialty Exam: Physical Exam  Nursing note and vitals reviewed. Constitutional: She is oriented to person, place, and time. She appears well-developed and well-nourished.  HENT:  Head: Normocephalic and atraumatic.  Respiratory: Effort normal.   Neurological: She is alert and oriented to person, place,  and time.    ROS  Blood pressure 101/77, pulse 100, temperature 97.9 F (36.6 C), temperature source Oral, resp. rate 16, height 5\' 1"  (1.549 m), weight 51.4 kg, SpO2 98 %.Body mass index is 21.41 kg/m.  General Appearance: Disheveled  Eye Contact:  Fair  Speech:  Normal Rate  Volume:  Normal  Mood:  Anxious, Dysphoric and Irritable  Affect:  Congruent  Thought Process:  Goal Directed and Descriptions of Associations: Circumstantial  Orientation:  Full (Time, Place, and Person)  Thought Content:  Logical  Suicidal Thoughts:  No  Homicidal Thoughts:  No  Memory:  Immediate;   Fair Recent;   Fair Remote;   Fair  Judgement:  Impaired  Insight:  Lacking  Psychomotor Activity:  Increased  Concentration:  Concentration: Fair and Attention Span: Fair  Recall:  [employer] of Knowledge:  Fair  Language:  Good  Akathisia:  Negative  Handed:  Right  AIMS (if indicated):     Assets:  Desire for Improvement Resilience  ADL's:  Intact  Cognition:  WNL  Sleep:  Number of Hours: 7.5    Treatment Plan Summary: Daily contact with patient to assess and evaluate symptoms and progress in treatment, Medication management and Plan : Patient is seen and examined.  Patient is a 43 year old female with the above-stated past psychiatric history who was readmitted to the hospital secondary to noncompliance with her medications as well as onset of psychotic behavior.  She will be admitted to the hospital.  She will be integrated into the milieu.  She will be encouraged to attend groups.  Review of her laboratories revealed her lithium on 7/25 to be less than 0.06, and her Depakote to be less than 10.  We will restart both these medications this a.m.  We will also restart her Wellbutrin XL.  We will attempt to contact her clinic on Monday to see if what she is saying with regard to stopping her medications are true.  Hopefully we can work out these  issues rather rapidly.  Observation Level/Precautions:  15 minute checks  Laboratory:  Chemistry Profile  Psychotherapy:    Medications:    Consultations:    Discharge Concerns:    Estimated LOS:  Other:     Physician Treatment Plan for Primary Diagnosis: <principal problem not specified> Long Term Goal(s): Improvement in symptoms so as ready for discharge  Short Term Goals: Ability to identify changes in lifestyle to reduce recurrence of condition will improve, Ability to verbalize feelings will improve, Ability to disclose and discuss suicidal ideas, Ability to demonstrate self-control will improve, Ability to identify and develop effective coping behaviors will improve, Ability to maintain clinical measurements within normal limits will improve, Compliance with prescribed medications will improve and Ability to identify triggers associated with substance abuse/mental health issues will improve  Physician Treatment Plan for Secondary Diagnosis: Active Problems:   Bipolar I disorder, most recent episode (or current) manic ([place])  Long Term Goal(s): Improvement in symptoms so as ready for discharge  Short Term Goals: Ability to identify changes in lifestyle to reduce recurrence of condition will improve, Ability to verbalize feelings will improve, Ability to disclose and discuss suicidal ideas, Ability to demonstrate self-control will improve, Ability to identify and develop effective coping behaviors will improve, Ability to maintain clinical measurements within normal limits will improve, Compliance with prescribed medications will improve and Ability to identify triggers associated with substance abuse/mental health issues will improve  I certify that inpatient services furnished can  reasonably be expected to improve the patient's condition.    Sharma Covert, MD 7/26/202011:47 AM

## 2019-06-03 NOTE — [facility] Suicide Risk Assessment (Signed)
[facility] INPATIENT:  Family/Significant Other Suicide Prevention Education  Suicide Prevention Education:  Patient Refusal for Family/Significant Other Suicide Prevention Education: The patient Autumn [name] has refused to provide written consent for family/significant other to be provided Family/Significant Other Suicide Prevention Education during admission and/or prior to discharge.  Physician notified.  Autumn Soria  [name] 06/03/2019, 4:37 PM

## 2019-06-03 NOTE — [facility] Suicide Risk Assessment (Signed)
[facility] Admission Suicide Risk Assessment   Nursing information obtained from:  Patient Demographic factors:  Caucasian, Unemployed Current Mental Status:  NA Loss Factors:  Financial problems / change in socioeconomic status Historical Factors:  NA Risk Reduction Factors:  NA  Total Time spent with patient: 30 minutes Principal Problem: <principal problem not specified> Diagnosis:  Active Problems:   Bipolar I disorder, most recent episode (or current) manic (HCC)  Subjective Data: Patient is seen and examined.  Patient is a 43 year old female with a reported past psychiatric history significant for bipolar disorder type I.  The patient had recently been hospitalized here, and was discharged on 7/22 with follow-up at a local clinic.  She was discharged on Wellbutrin XL 150 mg p.o. daily, Depakote DR 500 mg p.o. every 12 hours, lithium carbonate CR 450 mg p.o. every 12 hours, and trazodone 200 mg p.o. nightly.  She presented to the [facility] emergency department on 06/02/2019.  She stated that that time that she was currently pregnant and found out she was pregnant on her way to the hospital.  She reported her last use of cocaine was 4 years prior to admission, and reported using Xanax previously.  She also reported that she had been off for several days, and had not been sleeping.  In the emergency department she also admitted to sniffing heroin on and off.  She had been undressing in front of police and been acting paranoid as well.  She apparently is clear now, and stated that she had been discharged from the hospital and went to her appointment at [facility].  She stated there they told her to stop the lithium, Depakote and Wellbutrin.  They gave her Trintellix samples.  She stated that she was very anxious and took the Trintellix, but Taking multiple tablets of Trintellix because she did not feel it was working.  She was admitted to the hospital for evaluation and  stabilization.  Review of her laboratories revealed a mildly elevated white blood cell count at 15.2.  Her pregnancy test was negative.  Urinalysis was abnormal, but no bacteria were present.  It was a contaminated sample with 11-20 squamous epithelial cells.  Drug screen was negative.  Continued Clinical Symptoms:  Alcohol Use Disorder Identification Test Final Score (AUDIT): 0 The "Alcohol Use Disorders Identification Test", Guidelines for Use in Primary Care, Second Edition.  World Pharmacologist [facility]). Score between 0-7:  no or low risk or alcohol related problems. Score between 8-15:  moderate risk of alcohol related problems. Score between 16-19:  high risk of alcohol related problems. Score 20 or above:  warrants further diagnostic evaluation for alcohol dependence and treatment.   CLINICAL FACTORS:   Bipolar Disorder:   Mixed State Alcohol/Substance Abuse/Dependencies   Musculoskeletal: Strength & Muscle Tone: within normal limits Gait & Station: normal Patient leans: N/A  Psychiatric Specialty Exam: Physical Exam  Nursing note and vitals reviewed. Constitutional: She is oriented to person, place, and time. She appears well-developed and well-nourished.  HENT:  Head: Normocephalic and atraumatic.  Respiratory: Effort normal.  Neurological: She is alert and oriented to person, place, and time.    ROS  Blood pressure 101/77, pulse 100, temperature 97.9 F (36.6 C), temperature source Oral, resp. rate 16, height 5\' 1"  (1.549 m), weight 51.4 kg, SpO2 98 %.Body mass index is 21.41 kg/m.  General Appearance: Disheveled  Eye Contact:  Fair  Speech:  Normal Rate  Volume:  Normal  Mood:  Anxious  Affect:  Congruent  Thought Process:  Coherent and Descriptions of Associations: Circumstantial  Orientation:  Full (Time, Place, and Person)  Thought Content:  Logical  Suicidal Thoughts:  No  Homicidal Thoughts:  No  Memory:  Immediate;   Fair Recent;   Fair Remote;    Fair  Judgement:  Impaired  Insight:  Lacking  Psychomotor Activity:  Decreased  Concentration:  Concentration: Fair and Attention Span: Fair  Recall:  [employer] of Knowledge:  Fair  Language:  Good  Akathisia:  Negative  Handed:  Right  AIMS (if indicated):     Assets:  Desire for Improvement Resilience  ADL's:  Intact  Cognition:  WNL  Sleep:  Number of Hours: 7.5      COGNITIVE FEATURES THAT CONTRIBUTE TO RISK:  Thought constriction (tunnel vision)    SUICIDE RISK:   Minimal: No identifiable suicidal ideation.  Patients presenting with no risk factors but with morbid ruminations; may be classified as minimal risk based on the severity of the depressive symptoms  PLAN OF CARE: Patient is seen and examined.  Patient is a 43 year old female with the above-stated past psychiatric history who was readmitted to the hospital secondary to noncompliance with her medications as well as onset of psychotic behavior.  She will be admitted to the hospital.  She will be integrated into the milieu.  She will be encouraged to attend groups.  Review of her laboratories revealed her lithium on 7/25 to be less than 0.06, and her Depakote to be less than 10.  We will restart both these medications this a.m.  We will also restart her Wellbutrin XL.  We will attempt to contact her clinic on Monday to see if what she is saying with regard to stopping her medications are true.  Hopefully we can work out these issues rather rapidly.  I certify that inpatient services furnished can reasonably be expected to improve the patient's condition.   Sharma Covert, MD 06/03/2019, 8:55 AM

## 2019-06-04 MED ORDER — LITHIUM CARBONATE ER 450 MG PO TBCR
450.0000 mg | EXTENDED_RELEASE_TABLET | Freq: Two times a day (BID) | ORAL | Status: DC
  Administered 2019-06-04 – 2019-06-08 (×8): 450 mg via ORAL
  Filled 2019-06-04 (×8): qty 1

## 2019-06-04 MED ORDER — ARIPIPRAZOLE 10 MG PO TABS
20.0000 mg | ORAL_TABLET | Freq: Every day | ORAL | Status: DC
  Administered 2019-06-04 – 2019-06-08 (×5): 20 mg via ORAL
  Filled 2019-06-04 (×5): qty 2

## 2019-06-04 NOTE — Plan of Care (Signed)
D- Patient alert and oriented. Patient presents in a pleasant mood on assessment stating that she slept "ok until 3 am". Patient had no complaints to voice to this Probation officer. Patient denies SI, HI, AVH, and pain at this time. Patient also denies any signs/symptoms of depression/anxiety stating "not really, no". Patient's goal for today is to "try and find ways to cope with things and remember I can't act crazy in society".  A- Scheduled medications administered to patient, per MD orders. Support and encouragement provided.  Routine safety checks conducted every 15 minutes.  Patient informed to notify staff with problems or concerns.  R- No adverse drug reactions noted. Patient contracts for safety at this time. Patient compliant with medications and treatment plan. Patient receptive, calm, and cooperative. Patient interacts well with others on the unit.  Patient remains safe at this time.  Problem: Education: Goal: Ability to state activities that reduce stress will improve Outcome: Progressing   Problem: Self-Concept: Goal: Level of anxiety will decrease Outcome: Progressing   Problem: Education: Goal: Utilization of techniques to improve thought processes will improve Outcome: Progressing   Problem: Education: Goal: Knowledge of  General Education information/materials will improve Outcome: Progressing Goal: Emotional status will improve Outcome: Progressing Goal: Mental status will improve Outcome: Progressing Goal: Verbalization of understanding the information provided will improve Outcome: Progressing   Problem: Activity: Goal: Interest or engagement in activities will improve Outcome: Progressing Goal: Sleeping patterns will improve Outcome: Progressing   Problem: Coping: Goal: Ability to verbalize frustrations and anger appropriately will improve Outcome: Progressing Goal: Ability to demonstrate self-control will improve Outcome: Progressing   Problem: Health  Behavior/Discharge Planning: Goal: Identification of resources available to assist in meeting health care needs will improve Outcome: Progressing Goal: Compliance with treatment plan for underlying cause of condition will improve Outcome: Progressing   Problem: Physical Regulation: Goal: Ability to maintain clinical measurements within normal limits will improve Outcome: Progressing   Problem: Safety: Goal: Periods of time without injury will increase Outcome: Progressing   Problem: Education: Goal: Knowledge of the prescribed therapeutic regimen will improve Outcome: Progressing   Problem: Coping: Goal: Coping ability will improve Outcome: Progressing

## 2019-06-04 NOTE — Progress Notes (Signed)
[facility] MD Progress Note  06/04/2019 4:15 PM Autumn [name]  MRN:  [id number] Subjective: Follow-up for this patient with bipolar disorder.  Patient was returned to the hospital only a few days after her last discharge once again psychotic.  On interview today the patient seems to have calm down a bit.  She is still a little bit disorganized and has poor insight into what brought her back into the hospital but is not completely in denial.  She understands that her medications were not working well enough and that her behavior was inappropriate.  Patient says her mood today is feeling better although still anxious.  Sleep has improved a little bit.  She claims that the Wellbutrin she was prescribed caused her to have "seizures" although I think this is doubtful.  Patient denies suicidal or homicidal thoughts.  Remains disorganized and paranoid especially about her boyfriend and living situation. Principal Problem: Bipolar I disorder, most recent episode (or current) manic ([place]) Diagnosis: Principal Problem:   Bipolar I disorder, most recent episode (or current) manic ([place]) Active Problems:   Cannabis use disorder, severe, dependence ([place])  Total Time spent with patient: 30 minutes  Past Psychiatric History: History of bipolar disorder multiple hospitalizations for both manic and depressed episodes.  History of substance abuse.  Past Medical History:  Past Medical History:  Diagnosis Date  . Allergy    Seasonal, Ultram (seizures)  . Anemia 2005  . Bipolar 1 disorder ([place])   . Glaucoma   . Seizures ([place]) 2008    Past Surgical History:  Procedure Laterality Date  . BREAST BIOPSY Right    x 2  . CESAREAN SECTION     x 2   Family History:  Family History  Problem Relation Age of Onset  . Hypertension Mother   . Hyperlipidemia Mother   . Parkinson's disease Father    Family Psychiatric  History: See previous Social History:  Social History   Substance and Sexual Activity  Alcohol  Use Yes  . Alcohol/week: 4.0 standard drinks  . Types: 4 Cans of beer per week   Comment: Ocassional     Social History   Substance and Sexual Activity  Drug Use Yes  . Types: Marijuana   Comment: percocet -last use over 1 week ago. Heroin-last used 2/11    Social History   Socioeconomic History  . Marital status: Single    Spouse name: Not on file  . Number of children: Not on file  . Years of education: Not on file  . Highest education level: Not on file  Occupational History  . Not on file  Social Needs  . Financial resource strain: Not on file  . Food insecurity    Worry: Not on file    Inability: Not on file  . Transportation needs    Medical: Not on file    Non-medical: Not on file  Tobacco Use  . Smoking status: Current Every Day Smoker    Packs/day: 1.00    Years: 9.00    Pack years: 9.00  . Smokeless tobacco: Never Used  . Tobacco comment: Patient not interested  Substance and Sexual Activity  . Alcohol use: Yes    Alcohol/week: 4.0 standard drinks    Types: 4 Cans of beer per week    Comment: Ocassional  . Drug use: Yes    Types: Marijuana    Comment: percocet -last use over 1 week ago. Heroin-last used 2/11  . Sexual activity: Yes  Birth control/protection: None  Lifestyle  . Physical activity    Days per week: Not on file    Minutes per session: Not on file  . Stress: Not on file  Relationships  . Social Herbalist on phone: Not on file    Gets together: Not on file    Attends religious service: Not on file    Active member of club or organization: Not on file    Attends meetings of clubs or organizations: Not on file    Relationship status: Not on file  Other Topics Concern  . Not on file  Social History Narrative  . Not on file   Additional Social History:    History of alcohol / drug use?: No history of alcohol / drug abuse                    Sleep: Poor  Appetite:  Fair  Current Medications: Current  Facility-Administered Medications  Medication Dose Route Frequency Provider Last Rate Last Dose  . acetaminophen (TYLENOL) tablet 650 mg  650 mg Oral Q6H PRN Money, Lowry Ram, FNP      . alum & mag hydroxide-simeth (MAALOX/MYLANTA) 200-200-20 MG/5ML suspension 30 mL  30 mL Oral Q4H PRN Money, Lowry Ram, FNP      . ARIPiprazole (ABILIFY) tablet 20 mg  20 mg Oral Daily , [name], MD   20 mg at 06/04/19 1535  . divalproex (DEPAKOTE) DR tablet 500 mg  500 mg Oral Q12H Money, Lowry Ram, FNP   500 mg at 06/04/19 5170  . hydrOXYzine (ATARAX/VISTARIL) tablet 25 mg  25 mg Oral TID PRN Money, Lowry Ram, FNP      . lithium carbonate (ESKALITH) CR tablet 450 mg  450 mg Oral Q12H ,  T, MD      . OLANZapine zydis (ZYPREXA) disintegrating tablet 10 mg  10 mg Oral Q8H PRN Sharma Covert, MD       And  . LORazepam (ATIVAN) tablet 1 mg  1 mg Oral PRN Sharma Covert, MD       And  . ziprasidone (GEODON) injection 20 mg  20 mg Intramuscular PRN Sharma Covert, MD      . magnesium hydroxide (MILK OF MAGNESIA) suspension 30 mL  30 mL Oral Daily PRN Money, Darnelle Maffucci B, FNP      . nicotine (NICODERM CQ - dosed in mg/24 hours) patch 14 mg  14 mg Transdermal Daily Sharma Covert, MD   14 mg at 06/04/19 0814  . traZODone (DESYREL) tablet 200 mg  200 mg Oral QHS Money, Lowry Ram, FNP   200 mg at 06/03/19 2115    Lab Results: No results found for this or any previous visit (from the past 48 hour(s)).  Blood Alcohol level:  Lab Results  Component Value Date   ETH <10 06/02/2019   ETH <10 01/74/9449    Metabolic Disorder Labs: Lab Results  Component Value Date   HGBA1C 5.0 11/21/2018   MPG 96.8 11/21/2018   MPG 93.93 04/13/2018   No results found for: PROLACTIN Lab Results  Component Value Date   CHOL 196 11/21/2018   TRIG 97 11/21/2018   HDL 53 11/21/2018   CHOLHDL 3.7 11/21/2018   VLDL 19 11/21/2018   LDLCALC 124 (H) 11/21/2018   LDLCALC 117 (H) 04/13/2018    Physical  Findings: AIMS:  , ,  ,  ,    CIWA:    COWS:  Musculoskeletal: Strength & Muscle Tone: within normal limits Gait & Station: normal Patient leans: N/A  Psychiatric Specialty Exam: Physical Exam  Nursing note and vitals reviewed. Constitutional: She appears well-developed and well-nourished.  HENT:  Head: Normocephalic and atraumatic.  Eyes: Pupils are equal, round, and reactive to light. Conjunctivae are normal.  Neck: Normal range of motion.  Cardiovascular: Regular rhythm and normal heart sounds.  Respiratory: Effort normal.  GI: Soft.  Musculoskeletal: Normal range of motion.  Neurological: She is alert.  Skin: Skin is warm and dry.  Psychiatric: Her mood appears anxious. Her affect is blunt. Her speech is tangential. She is not agitated and not aggressive. Thought content is paranoid. She expresses impulsivity. She expresses no homicidal and no suicidal ideation. She exhibits abnormal recent memory.    Review of Systems  Constitutional: Negative.   HENT: Negative.   Eyes: Negative.   Respiratory: Negative.   Cardiovascular: Negative.   Gastrointestinal: Negative.   Musculoskeletal: Negative.   Skin: Negative.   Neurological: Negative.   Psychiatric/Behavioral: The patient is nervous/anxious.     Blood pressure 103/71, pulse 82, temperature 98.4 F (36.9 C), temperature source Oral, resp. rate 18, height '5\' 1"'  (1.549 m), weight 51.4 kg, SpO2 100 %.Body mass index is 21.41 kg/m.  General Appearance: Casual  Eye Contact:  Minimal  Speech:  Slurred  Volume:  Increased  Mood:  Anxious  Affect:  Congruent  Thought Process:  Disorganized  Orientation:  Full (Time, Place, and Person)  Thought Content:  Rumination and Tangential  Suicidal Thoughts:  No  Homicidal Thoughts:  No  Memory:  Immediate;   Fair Recent;   Poor Remote;   Poor  Judgement:  Impaired  Insight:  Shallow  Psychomotor Activity:  Decreased  Concentration:  Concentration: Fair  Recall:  [employer] of Knowledge:  Fair  Language:  Fair  Akathisia:  No  Handed:  Right  AIMS (if indicated):     Assets:  Desire for Improvement  ADL's:  Impaired  Cognition:  Impaired,  Mild  Sleep:  Number of Hours: 9     Treatment Plan Summary: Daily contact with patient to assess and evaluate symptoms and progress in treatment, Medication management and Plan Patient met with treatment team.  Identified the need to get her life more stable.  Reviewed medicine with her.  Cut back on Wellbutrin.  This medicine actually has been helpful for her in the past but she is claiming to have side effects now although nothing that was witnessed.  Continue the Depakote and lithium both of which have been very helpful for her in the past.  Patient is requesting assistance in finding a living situation.  Treatment team could try to help her look into that but this may still be some lingering paranoia about her boyfriend.  Restart Abilify as antipsychotics have been particularly helpful for her in the past.  Alethia Berthold, MD 06/04/2019, 4:15 PM

## 2019-06-04 NOTE — Plan of Care (Signed)
  Problem: Education: Goal: Ability to state activities that reduce stress will improve Outcome: Progressing   Problem: Self-Concept: Goal: Level of anxiety will decrease Outcome: Progressing   Problem: Education: Goal: Utilization of techniques to improve thought processes will improve Outcome: Progressing   Problem: Education: Goal: Knowledge of [place] [facility] Education information/materials will improve Outcome: Progressing Goal: Emotional status will improve Outcome: Progressing Goal: Mental status will improve Outcome: Progressing Goal: Verbalization of understanding the information provided will improve Outcome: Progressing   Problem: Activity: Goal: Interest or engagement in activities will improve Outcome: Progressing Goal: Sleeping patterns will improve Outcome: Progressing   Problem: Coping: Goal: Ability to verbalize frustrations and anger appropriately will improve Outcome: Progressing Goal: Ability to demonstrate self-control will improve Outcome: Progressing   Problem: Health Behavior/Discharge Planning: Goal: Identification of resources available to assist in meeting health care needs will improve Outcome: Progressing Goal: Compliance with treatment plan for underlying cause of condition will improve Outcome: Progressing   Problem: Physical Regulation: Goal: Ability to maintain clinical measurements within normal limits will improve Outcome: Progressing   Problem: Safety: Goal: Periods of time without injury will increase Outcome: Progressing   Problem: Education: Goal: Knowledge of the prescribed therapeutic regimen will improve Outcome: Progressing   Problem: Coping: Goal: Coping ability will improve Outcome: Progressing

## 2019-06-04 NOTE — Progress Notes (Signed)
Patient slept throughout the night. Presented to the nurses station around 0430 reporting that "I had a small seizure but I stopped it...". She returned to bed and woke up in the morning for vital signs. Reported that the seizure is related to the medication "I told you that I am allergic to Welbutrin....". Currently in the dayroom with peers. No evidence of seizure occurrence. Patient is alert oriented x 4, relaxed. Denying pain. Was encouraged to review medications regime with MD. Safety precautions reinforced.

## 2019-06-04 NOTE — [facility] Group Notes (Signed)

## 2019-06-04 NOTE — Progress Notes (Signed)
Patient is responding well to treatment regimen and therapy, socialize appropriately with peers and takes her medicines without any noticeable side effects , denies any SI/HI/AVH at this time, patient given education and support and encouraged to express any concerns patient may have. Patient is her room resting comfortably and no acute distress.

## 2019-06-04 NOTE — [facility] Suicide Risk Assessment (Signed)
[facility] INPATIENT:  Family/Significant Other Suicide Prevention Education  Suicide Prevention Education:  Contact Attempts:  Dannette Barbara, roommate [phone] has been identified by the patient as the family member/significant other with whom the patient will be residing, and identified as the person(s) who will aid the patient in the event of a mental health crisis.  With written consent from the patient, two attempts were made to provide suicide prevention education, prior to and/or following the patient's discharge.  We were unsuccessful in providing suicide prevention education.  A suicide education pamphlet was given to the patient to share with family/significant other.  Date and time of first attempt: 06/04/19 at 9:37 AM Date and time of second attempt: Second attempt is needed.  Rozann Lesches 06/04/2019, 9:36 AM

## 2019-06-04 NOTE — Tx Team (Signed)
Interdisciplinary Treatment and Diagnostic Plan Update  06/04/2019 Time of Session: 230PM Autumn [name] MRN: [id number]  Principal Diagnosis: <principal problem not specified>  Secondary Diagnoses: Active Problems:   Bipolar I disorder, most recent episode (or current) manic (HCC)   Current Medications:  Current Facility-Administered Medications  Medication Dose Route Frequency Provider Last Rate Last Dose  . acetaminophen (TYLENOL) tablet 650 mg  650 mg Oral Q6H PRN Money, Lowry Ram, FNP      . alum & mag hydroxide-simeth (MAALOX/MYLANTA) 200-200-20 MG/5ML suspension 30 mL  30 mL Oral Q4H PRN Money, Lowry Ram, FNP      . ARIPiprazole (ABILIFY) tablet 20 mg  20 mg Oral Daily Clapacs, [name], MD      . divalproex (DEPAKOTE) DR tablet 500 mg  500 mg Oral Q12H Money, Lowry Ram, FNP   500 mg at 06/04/19 4098  . hydrOXYzine (ATARAX/VISTARIL) tablet 25 mg  25 mg Oral TID PRN Money, Lowry Ram, FNP      . lithium carbonate (ESKALITH) CR tablet 450 mg  450 mg Oral Q12H Clapacs, [name], MD      . OLANZapine zydis (ZYPREXA) disintegrating tablet 10 mg  10 mg Oral Q8H PRN Sharma Covert, MD       And  . LORazepam (ATIVAN) tablet 1 mg  1 mg Oral PRN Sharma Covert, MD       And  . ziprasidone (GEODON) injection 20 mg  20 mg Intramuscular PRN Sharma Covert, MD      . magnesium hydroxide (MILK OF MAGNESIA) suspension 30 mL  30 mL Oral Daily PRN Money, Darnelle Maffucci B, FNP      . nicotine (NICODERM CQ - dosed in mg/24 hours) patch 14 mg  14 mg Transdermal Daily Sharma Covert, MD   14 mg at 06/04/19 0814  . traZODone (DESYREL) tablet 200 mg  200 mg Oral QHS Money, Lowry Ram, FNP   200 mg at 06/03/19 2115   PTA Medications: Medications Prior to Admission  Medication Sig Dispense Refill Last Dose  . buPROPion (WELLBUTRIN XL) 150 MG 24 hr tablet Take 1 tablet (150 mg total) by mouth daily. 7 tablet 0   . divalproex (DEPAKOTE) 500 MG DR tablet Take 1 tablet (500 mg total) by mouth every 12  (twelve) hours. 14 tablet 0   . lithium carbonate (ESKALITH) 450 MG CR tablet Take 1 tablet (450 mg total) by mouth every 12 (twelve) hours. 14 tablet 0   . Norgestimate-Ethinyl Estradiol Triphasic (ORTHO TRI-CYCLEN, 28,) 0.18/0.215/0.25 MG-35 MCG tablet Take 1 tablet by mouth daily.     . traZODone (DESYREL) 100 MG tablet Take 2 tablets (200 mg total) by mouth at bedtime. 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   Physician Treatment Plan for Primary Diagnosis: <principal problem not specified> Long Term Goal(s): Improvement in symptoms so as ready for discharge Improvement in symptoms so as ready for discharge   Short Term Goals: Ability to identify changes in lifestyle to reduce recurrence of condition will improve Ability to verbalize feelings will improve Ability to disclose and discuss suicidal ideas Ability to demonstrate self-control will improve Ability to identify and develop effective coping behaviors will improve Ability to maintain clinical measurements within normal limits will improve Compliance with prescribed medications will improve Ability  to identify triggers associated with substance abuse/mental health issues will improve Ability to identify changes in lifestyle to reduce recurrence of condition will improve Ability to verbalize feelings will improve Ability to disclose and discuss suicidal ideas Ability to demonstrate self-control will improve Ability to identify and develop effective coping behaviors will improve Ability to maintain clinical measurements within normal limits will improve Compliance with prescribed medications will improve Ability to identify triggers associated with substance abuse/mental  health issues will improve  Medication Management: Evaluate patient's response, side effects, and tolerance of medication regimen.  Therapeutic Interventions: 1 to 1 sessions, Unit Group sessions and Medication administration.  Evaluation of Outcomes: Not Met  Physician Treatment Plan for Secondary Diagnosis: Active Problems:   Bipolar I disorder, most recent episode (or current) manic ([place])  Long Term Goal(s): Improvement in symptoms so as ready for discharge Improvement in symptoms so as ready for discharge   Short Term Goals: Ability to identify changes in lifestyle to reduce recurrence of condition will improve Ability to verbalize feelings will improve Ability to disclose and discuss suicidal ideas Ability to demonstrate self-control will improve Ability to identify and develop effective coping behaviors will improve Ability to maintain clinical measurements within normal limits will improve Compliance with prescribed medications will improve Ability to identify triggers associated with substance abuse/mental health issues will improve Ability to identify changes in lifestyle to reduce recurrence of condition will improve Ability to verbalize feelings will improve Ability to disclose and discuss suicidal ideas Ability to demonstrate self-control will improve Ability to identify and develop effective coping behaviors will improve Ability to maintain clinical measurements within normal limits will improve Compliance with prescribed medications will improve Ability to identify triggers associated with substance abuse/mental health issues will improve     Medication Management: Evaluate patient's response, side effects, and tolerance of medication regimen.  Therapeutic Interventions: 1 to 1 sessions, Unit Group sessions and Medication administration.  Evaluation of Outcomes: Not Met   RN Treatment Plan for Primary Diagnosis: <principal problem not specified> Long Term Goal(s):  Knowledge of disease and therapeutic regimen to maintain health will improve  Short Term Goals: Ability to remain free from injury will improve, Ability to demonstrate self-control, Ability to identify and develop effective coping behaviors will improve and Compliance with prescribed medications will improve  Medication Management: RN will administer medications as ordered by provider, will assess and evaluate patient's response and provide education to patient for prescribed medication. RN will report any adverse and/or side effects to prescribing provider.  Therapeutic Interventions: 1 on 1 counseling sessions, Psychoeducation, Medication administration, Evaluate responses to treatment, Monitor vital signs and CBGs as ordered, Perform/monitor CIWA, COWS, AIMS and Fall Risk screenings as ordered, Perform wound care treatments as ordered.  Evaluation of Outcomes: Not Met   LCSW Treatment Plan for Primary Diagnosis: <principal problem not specified> Long Term Goal(s): Safe transition to appropriate next level of care at discharge, Engage patient in therapeutic group addressing interpersonal concerns.  Short Term Goals: Engage patient in aftercare planning with referrals and resources, Increase social support, Identify triggers associated with mental health/substance abuse issues and Increase skills for wellness and recovery  Therapeutic Interventions: Assess for all discharge needs, 1 to 1 time with Social worker, Explore available resources and support systems, Assess for adequacy in community support network, Educate family and significant other(s) on suicide prevention, Complete Psychosocial Assessment, Interpersonal group therapy.  Evaluation of Outcomes: Not Met   Progress in Treatment: Attending groups: No. Participating in groups: No. Taking  medication as prescribed: Yes. Toleration medication: Yes. Family/Significant other contact made: No, will contact:  pts roommate Patient  understands diagnosis: No. Discussing patient identified problems/goals with staff: Yes. Medical problems stabilized or resolved: Yes. Denies suicidal/homicidal ideation: Yes. Issues/concerns per patient self-inventory: No. Other: N/A  New problem(s) identified: No, Describe:  none  New Short Term/Long Term Goal(s): elimination of AVH/symptoms of psychosis, medication management for mood stabilization;  development of comprehensive mental wellness/sobriety plan.   Patient Goals:  "To learn better coping skills for stress"  Discharge Plan or Barriers: SPE pamphlet, Mobile Crisis information, and AA/NA information provided to patient for additional community support and resources. Pt will follow up with [facility] at discharge.  Reason for Continuation of Hospitalization: Delusions  Medication stabilization  Estimated Length of Stay: 5-7 days  Attendees: Patient: Autumn [name] 06/04/2019 3:17 PM  Physician: Dr Weber Cooks MD 06/04/2019 3:17 PM  Nursing: Polly Cobia RN 06/04/2019 3:17 PM  RN Care Manager: 06/04/2019 3:17 PM  Social Worker: Evalina Field LCSW 06/04/2019 3:17 PM  Recreational Therapist:  06/04/2019 3:17 PM  Other: Rhunette Croft LCSW 06/04/2019 3:17 PM  Other: Sanjuana Kava LCSW 06/04/2019 3:17 PM  Other: 06/04/2019 3:17 PM    Scribe for Treatment Team: Mariann Laster Joanette Silveria, LCSW 06/04/2019 3:17 PM
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
## 2019-06-04 NOTE — Progress Notes (Signed)
Patient refused her Wellbutrin stating that "they said I can't have it because it makes me have seizures". It was also stated to this writer in report that patient stated that she woke up over night having a seizure and she told herself to stop and she did. Patient stated to this writer that this is the reason for refusing her medication this morning.

## 2019-06-05 NOTE — Progress Notes (Signed)
Patient denies any thoughts of SI/HI/AVH but expresses that she is depressed today and want to stay in her room , education and support is provided , encourage patient to apply her coping skills, and improve her mental and emotional state , patient took her medicines with no noticeable side effects and no further complains , 15 minutes safety check is maintained.

## 2019-06-05 NOTE — Plan of Care (Signed)
D- Patient alert and oriented. Patient presents in a pleasant mood on assessment stating that she kept waking up last night, but had no major complaints to voice to this Probation officer. Patient denied SI, HI, AVH, and pain at the time of assessment. Patient also denied any signs/symptoms of depression/anxiety stating that she is feeling "ok". Patient had no stated goals for today.  A- Scheduled medications administered to patient, per MD orders. Support and encouragement provided.  Routine safety checks conducted every 15 minutes.  Patient informed to notify staff with problems or concerns.  R- No adverse drug reactions noted. Patient contracts for safety at this time. Patient compliant with medications and treatment plan. Patient receptive, calm, and cooperative. Patient interacts well with others on the unit.  Patient remains safe at this time.  Problem: Education: Goal: Ability to state activities that reduce stress will improve Outcome: Progressing   Problem: Self-Concept: Goal: Level of anxiety will decrease Outcome: Progressing   Problem: Education: Goal: Utilization of techniques to improve thought processes will improve Outcome: Progressing   Problem: Education: Goal: Knowledge of [place] [facility] Education information/materials will improve Outcome: Progressing Goal: Emotional status will improve Outcome: Progressing Goal: Mental status will improve Outcome: Progressing Goal: Verbalization of understanding the information provided will improve Outcome: Progressing   Problem: Activity: Goal: Interest or engagement in activities will improve Outcome: Progressing Goal: Sleeping patterns will improve Outcome: Progressing   Problem: Coping: Goal: Ability to verbalize frustrations and anger appropriately will improve Outcome: Progressing Goal: Ability to demonstrate self-control will improve Outcome: Progressing   Problem: Health Behavior/Discharge Planning: Goal:  Identification of resources available to assist in meeting health care needs will improve Outcome: Progressing Goal: Compliance with treatment plan for underlying cause of condition will improve Outcome: Progressing   Problem: Physical Regulation: Goal: Ability to maintain clinical measurements within normal limits will improve Outcome: Progressing   Problem: Safety: Goal: Periods of time without injury will increase Outcome: Progressing   Problem: Education: Goal: Knowledge of the prescribed therapeutic regimen will improve Outcome: Progressing   Problem: Coping: Goal: Coping ability will improve Outcome: Progressing

## 2019-06-05 NOTE — [facility] Suicide Risk Assessment (Addendum)
[facility] INPATIENT:  Family/Significant Other Suicide Prevention Education  Suicide Prevention Education:  Education Completed; Dannette Barbara, roommate, [phone] has been identified by the patient as the family member/significant other with whom the patient will be residing, and identified as the person(s) who will aid the patient in the event of a mental health crisis (suicidal ideations/suicide attempt).  With written consent from the patient, the family member/significant other has been provided the following suicide prevention education, prior to the and/or following the discharge of the patient.  The suicide prevention education provided includes the following:  Suicide risk factors  Suicide prevention and interventions  National Suicide Hotline telephone number  [facility] assessment telephone number  [facility] Emergency Assistance [place] and/or Residential Mobile Crisis Unit telephone number  Request made of family/significant other to:  Remove weapons (e.g., guns, rifles, knives), all items previously/currently identified as safety concern.    Remove drugs/medications (over-the-counter, prescriptions, illicit drugs), all items previously/currently identified as a safety concern.  The family member/significant other verbalizes understanding of the suicide prevention education information provided.  The family member/significant other agrees to remove the items of safety concern listed above.  He reports that she "is definitely going manic, she hasn't had a good night's rest in I don't know when."  He reports that "I would love for her not to come back here."  He reports that "we're not together anymore, she's been very promiscuous lately".  He reports that legally patient can return home because he has no plans on evicting the patient or take out eviction paperwork on the patient.  He reports " I just don't have it in me to take care of her."  He  reports that "sge was released on Monday and on Tuesday the police called me because she was trying to break in with a boxcutter after losing her key".  He reports a belief that the patient is not ready for discharge "based off my conversations with her".  He reports that he would need to talk to patient about returning to the home prior to her being discharged.  He reports that they live in a boarding room and patient does not contribute to rent nor is there a lease.     Rozann Lesches 06/05/2019, 3:03 PM

## 2019-06-05 NOTE — Progress Notes (Signed)
Recreation Therapy Notes    Date: 06/05/2019  Time: 9:30 am  Location: Craft room  Behavioral response: Appropriate   Intervention Topic: Problem Solving   Discussion/Intervention:  Group content on today was focused on problem solving. The group described what problem solving is. Patients expressed how problems affect them and how they deal with problems. Individuals identified healthy ways to deal with problems. Patients explained what normally happens to them when they do not deal with problems. The group expressed reoccurring problems for them. The group participated in the intervention "Ways to Solve problems" where patients were given a chance to explore different ways to solve problems.  Clinical Observations/Feedback:  Patient came to group late due to unknown reasons. She expressed that a problem she is dealing with now is that her mother want her to go to [facility] but she doe not think it will be beneficial for her. Individual was social with peers and staff while participating in the intervention.  Autumn [name] LRT/CTRS         Autumn [name] 06/05/2019 11:47 AM

## 2019-06-05 NOTE — Progress Notes (Signed)
Repeat BP is 105/70

## 2019-06-05 NOTE — Plan of Care (Signed)
  Problem: Education: Goal: Ability to state activities that reduce stress will improve Outcome: Progressing   Problem: Self-Concept: Goal: Level of anxiety will decrease Outcome: Progressing   Problem: Education: Goal: Utilization of techniques to improve thought processes will improve Outcome: Progressing   Problem: Education: Goal: Knowledge of [place] [facility] Education information/materials will improve Outcome: Progressing Goal: Emotional status will improve Outcome: Progressing Goal: Mental status will improve Outcome: Progressing Goal: Verbalization of understanding the information provided will improve Outcome: Progressing   Problem: Activity: Goal: Interest or engagement in activities will improve Outcome: Progressing Goal: Sleeping patterns will improve Outcome: Progressing   Problem: Coping: Goal: Ability to verbalize frustrations and anger appropriately will improve Outcome: Progressing Goal: Ability to demonstrate self-control will improve Outcome: Progressing   Problem: Health Behavior/Discharge Planning: Goal: Identification of resources available to assist in meeting health care needs will improve Outcome: Progressing Goal: Compliance with treatment plan for underlying cause of condition will improve Outcome: Progressing   Problem: Physical Regulation: Goal: Ability to maintain clinical measurements within normal limits will improve Outcome: Progressing   Problem: Safety: Goal: Periods of time without injury will increase Outcome: Progressing   Problem: Education: Goal: Knowledge of the prescribed therapeutic regimen will improve Outcome: Progressing   Problem: Coping: Goal: Coping ability will improve Outcome: Progressing

## 2019-06-05 NOTE — Progress Notes (Signed)
Recreation Therapy Notes  INPATIENT RECREATION THERAPY ASSESSMENT  Patient Details Name: Samuella Rasool MRN: [id number] DOB: 03-12-1976 Today's Date: 06/05/2019       Information Obtained From: Patient  Able to Participate in Assessment/Interview: Yes  Patient Presentation: Responsive  Reason for Admission (Per Patient): Active Symptoms  Patient Stressors:    Coping Skills:   TV, Music, Read  Leisure Interests (2+):  Individual - Reading, Sports - Swimming, Exercise - Walking  Frequency of Recreation/Participation: Monthly  Awareness of Community Resources:     [employer]:     Current Use:    If no, Barriers?:    Expressed Interest in [place] of Residence:  Insurance underwriter  Patient Main Form of Transportation: Walk  Patient Strengths:  Caring  Patient Identified Areas of Improvement:  Trust  Patient Goal for Hospitalization:  Have a good discharge plan  Current SI (including self-harm):  No  Current HI:  No  Current AVH: No  Staff Intervention Plan: Group Attendance, Collaborate with Interdisciplinary Treatment Team  Consent to Intern Participation: N/A  Alayla Dethlefs 06/05/2019, 3:47 PM

## 2019-06-05 NOTE — Progress Notes (Signed)
[facility] MD Progress Note  06/05/2019 5:59 PM Autumn [name]  MRN:  [id number] Subjective: Patient seen and chart reviewed.  Patient says she is feeling more depressed today.  Denies suicidal thoughts.  She tells me she feels no motivation and has just been staying in bed all of the time.  Affect blunted.  Thoughts lucid.  No obvious bizarre thinking.  Tolerating medicine.  She expresses the opinion that she would like to be back on antidepressant medicine.  Physically stable. Principal Problem: Bipolar I disorder, most recent episode (or current) manic ([place]) Diagnosis: Principal Problem:   Bipolar I disorder, most recent episode (or current) manic ([place]) Active Problems:   Cannabis use disorder, severe, dependence ([place])  Total Time spent with patient: 30 minutes  Past Psychiatric History: Past history of recurrent episodes of psychotic depression mania agitation  Past Medical History:  Past Medical History:  Diagnosis Date  . Allergy    Seasonal, Ultram (seizures)  . Anemia 2005  . Bipolar 1 disorder ([facility])   . Glaucoma   . Seizures ([place]) 2008    Past Surgical History:  Procedure Laterality Date  . BREAST BIOPSY Right    x 2  . CESAREAN SECTION     x 2   Family History:  Family History  Problem Relation Age of Onset  . Hypertension Mother   . Hyperlipidemia Mother   . Parkinson's disease Father    Family Psychiatric  History: See previous Social History:  Social History   Substance and Sexual Activity  Alcohol Use Yes  . Alcohol/week: 4.0 standard drinks  . Types: 4 Cans of beer per week   Comment: Ocassional     Social History   Substance and Sexual Activity  Drug Use Yes  . Types: Marijuana   Comment: percocet -last use over 1 week ago. Heroin-last used 2/11    Social History   Socioeconomic History  . Marital status: Single    Spouse name: Not on file  . Number of children: Not on file  . Years of education: Not on file  . Highest education level: Not  on file  Occupational History  . Not on file  Social Needs  . Financial resource strain: Not on file  . Food insecurity    Worry: Not on file    Inability: Not on file  . Transportation needs    Medical: Not on file    Non-medical: Not on file  Tobacco Use  . Smoking status: Current Every Day Smoker    Packs/day: 1.00    Years: 9.00    Pack years: 9.00  . Smokeless tobacco: Never Used  . Tobacco comment: Patient not interested  Substance and Sexual Activity  . Alcohol use: Yes    Alcohol/week: 4.0 standard drinks    Types: 4 Cans of beer per week    Comment: Ocassional  . Drug use: Yes    Types: Marijuana    Comment: percocet -last use over 1 week ago. Heroin-last used 2/11  . Sexual activity: Yes    Birth control/protection: None  Lifestyle  . Physical activity    Days per week: Not on file    Minutes per session: Not on file  . Stress: Not on file  Relationships  . Social Herbalist on phone: Not on file    Gets together: Not on file    Attends religious service: Not on file    Active member of club or organization: Not on  file    Attends meetings of clubs or organizations: Not on file    Relationship status: Not on file  Other Topics Concern  . Not on file  Social History Narrative  . Not on file   Additional Social History:    History of alcohol / drug use?: No history of alcohol / drug abuse                    Sleep: Fair  Appetite:  Fair  Current Medications: Current Facility-Administered Medications  Medication Dose Route Frequency Provider Last Rate Last Dose  . acetaminophen (TYLENOL) tablet 650 mg  650 mg Oral Q6H PRN Money, Lowry Ram, FNP   650 mg at 06/05/19 1054  . alum & mag hydroxide-simeth (MAALOX/MYLANTA) 200-200-20 MG/5ML suspension 30 mL  30 mL Oral Q4H PRN Money, Lowry Ram, FNP      . ARIPiprazole (ABILIFY) tablet 20 mg  20 mg Oral Daily Clapacs, [name], MD   20 mg at 06/05/19 0836  . divalproex (DEPAKOTE) DR tablet 500  mg  500 mg Oral Q12H Money, Lowry Ram, FNP   500 mg at 06/05/19 5284  . hydrOXYzine (ATARAX/VISTARIL) tablet 25 mg  25 mg Oral TID PRN Money, Lowry Ram, FNP      . lithium carbonate (ESKALITH) CR tablet 450 mg  450 mg Oral Q12H Clapacs, [name], MD   450 mg at 06/05/19 0836  . OLANZapine zydis (ZYPREXA) disintegrating tablet 10 mg  10 mg Oral Q8H PRN Sharma Covert, MD       And  . LORazepam (ATIVAN) tablet 1 mg  1 mg Oral PRN Sharma Covert, MD       And  . ziprasidone (GEODON) injection 20 mg  20 mg Intramuscular PRN Sharma Covert, MD      . magnesium hydroxide (MILK OF MAGNESIA) suspension 30 mL  30 mL Oral Daily PRN Money, Darnelle Maffucci B, FNP      . nicotine (NICODERM CQ - dosed in mg/24 hours) patch 14 mg  14 mg Transdermal Daily Sharma Covert, MD   14 mg at 06/05/19 0837  . traZODone (DESYREL) tablet 200 mg  200 mg Oral QHS Money, Lowry Ram, FNP   200 mg at 06/04/19 2112    Lab Results: No results found for this or any previous visit (from the past 48 hour(s)).  Blood Alcohol level:  Lab Results  Component Value Date   ETH <10 06/02/2019   ETH <10 13/24/4010    Metabolic Disorder Labs: Lab Results  Component Value Date   HGBA1C 5.0 11/21/2018   MPG 96.8 11/21/2018   MPG 93.93 04/13/2018   No results found for: PROLACTIN Lab Results  Component Value Date   CHOL 196 11/21/2018   TRIG 97 11/21/2018   HDL 53 11/21/2018   CHOLHDL 3.7 11/21/2018   VLDL 19 11/21/2018   LDLCALC 124 (H) 11/21/2018   LDLCALC 117 (H) 04/13/2018    Physical Findings: AIMS:  , ,  ,  ,    CIWA:    COWS:     Musculoskeletal: Strength & Muscle Tone: within normal limits Gait & Station: unsteady Patient leans: Right  Psychiatric Specialty Exam: Physical Exam  Nursing note and vitals reviewed. Constitutional: She appears well-developed and well-nourished.  HENT:  Head: Normocephalic and atraumatic.  Eyes: Pupils are equal, round, and reactive to light. Conjunctivae are normal.   Neck: Normal range of motion.  Cardiovascular: Regular rhythm and normal heart sounds.  Respiratory: Effort normal. No respiratory distress.  GI: Soft.  Musculoskeletal: Normal range of motion.  Neurological: She is alert.  Skin: Skin is warm and dry.  Psychiatric: Her affect is blunt. Her speech is delayed. She is slowed. Cognition and memory are impaired. She expresses impulsivity. She expresses no homicidal and no suicidal ideation.    Review of Systems  Constitutional: Negative.   HENT: Negative.   Eyes: Negative.   Respiratory: Negative.   Cardiovascular: Negative.   Gastrointestinal: Negative.   Musculoskeletal: Negative.   Skin: Negative.   Neurological: Negative.   Psychiatric/Behavioral: Positive for depression.    Blood pressure 105/70, pulse 75, temperature 97.7 F (36.5 C), temperature source Oral, resp. rate 18, height 5\' 1"  (1.549 m), weight 51.4 kg, SpO2 100 %.Body mass index is 21.41 kg/m.  General Appearance: Casual  Eye Contact:  Fair  Speech:  Slow  Volume:  Decreased  Mood:  Dysphoric  Affect:  Congruent  Thought Process:  Goal Directed  Orientation:  Full (Time, Place, and Person)  Thought Content:  Logical  Suicidal Thoughts:  No  Homicidal Thoughts:  No  Memory:  Immediate;   Fair Recent;   Poor Remote;   Poor  Judgement:  Poor  Insight:  Shallow  Psychomotor Activity:  Decreased  Concentration:  Concentration: Poor  Recall:  Poor  Fund of Knowledge:  Fair  Language:  Fair  Akathisia:  No  Handed:  Right  AIMS (if indicated):     Assets:  Desire for Improvement Physical Health  ADL's:  Impaired  Cognition:  Impaired,  Mild  Sleep:  Number of Hours: 8.15     Treatment Plan Summary: Daily contact with patient to assess and evaluate symptoms and progress in treatment, Medication management and Plan Patient is reporting feeling depressed and is passively wanting to rely on [place] to find her a new place to live.  She says that she cannot go  back to stay with her boyfriend again.  Patient is not participating well in groups.  Encouraged her to get up get out of bed participate more actively.  She is not a good candidate for antidepressant medicine given her recurrent manic spells.  No change to medication for today.  Alethia Berthold, MD 06/05/2019, 5:59 PM

## 2019-06-05 NOTE — [facility] Group Notes (Signed)

## 2019-06-06 NOTE — Progress Notes (Signed)
[facility] MD Progress Note  06/06/2019 5:13 PM Autumn [name]  MRN:  [id number] Subjective: Patient seen for follow-up.  Patient says today she is feeling better.  Not feeling depressed.  Denies suicidal thoughts.  Denies psychotic symptoms.  She is unhappy because her boyfriend has said that she cannot come back home and when she is on some other antipsychotic.  Patient feels that the Abilify has been most helpful for her and does not want to restart Zyprexa.  She is still not getting up and attending groups very much. Principal Problem: Bipolar I disorder, most recent episode (or current) manic ([place]) Diagnosis: Principal Problem:   Bipolar I disorder, most recent episode (or current) manic ([place]) Active Problems:   Cannabis use disorder, severe, dependence ([place])  Total Time spent with patient: 30 minutes  Past Psychiatric History: History of bipolar disorder multiple hospitalizations also history of substance abuse  Past Medical History:  Past Medical History:  Diagnosis Date  . Allergy    Seasonal, Ultram (seizures)  . Anemia 2005  . Bipolar 1 disorder ([place])   . Glaucoma   . Seizures ([place]) 2008    Past Surgical History:  Procedure Laterality Date  . BREAST BIOPSY Right    x 2  . CESAREAN SECTION     x 2   Family History:  Family History  Problem Relation Age of Onset  . Hypertension Mother   . Hyperlipidemia Mother   . Parkinson's disease Father    Family Psychiatric  History: See previous Social History:  Social History   Substance and Sexual Activity  Alcohol Use Yes  . Alcohol/week: 4.0 standard drinks  . Types: 4 Cans of beer per week   Comment: Ocassional     Social History   Substance and Sexual Activity  Drug Use Yes  . Types: Marijuana   Comment: percocet -last use over 1 week ago. Heroin-last used 2/11    Social History   Socioeconomic History  . Marital status: Single    Spouse name: Not on file  . Number of children: Not on file  . Years of  education: Not on file  . Highest education level: Not on file  Occupational History  . Not on file  Social Needs  . Financial resource strain: Not on file  . Food insecurity    Worry: Not on file    Inability: Not on file  . Transportation needs    Medical: Not on file    Non-medical: Not on file  Tobacco Use  . Smoking status: Current Every Day Smoker    Packs/day: 1.00    Years: 9.00    Pack years: 9.00  . Smokeless tobacco: Never Used  . Tobacco comment: Patient not interested  Substance and Sexual Activity  . Alcohol use: Yes    Alcohol/week: 4.0 standard drinks    Types: 4 Cans of beer per week    Comment: Ocassional  . Drug use: Yes    Types: Marijuana    Comment: percocet -last use over 1 week ago. Heroin-last used 2/11  . Sexual activity: Yes    Birth control/protection: None  Lifestyle  . Physical activity    Days per week: Not on file    Minutes per session: Not on file  . Stress: Not on file  Relationships  . Social Herbalist on phone: Not on file    Gets together: Not on file    Attends religious service: Not on file  Active member of club or organization: Not on file    Attends meetings of clubs or organizations: Not on file    Relationship status: Not on file  Other Topics Concern  . Not on file  Social History Narrative  . Not on file   Additional Social History:    History of alcohol / drug use?: No history of alcohol / drug abuse                    Sleep: Fair  Appetite:  Fair  Current Medications: Current Facility-Administered Medications  Medication Dose Route Frequency Provider Last Rate Last Dose  . acetaminophen (TYLENOL) tablet 650 mg  650 mg Oral Q6H PRN Money, Lowry Ram, FNP   650 mg at 06/06/19 1136  . alum & mag hydroxide-simeth (MAALOX/MYLANTA) 200-200-20 MG/5ML suspension 30 mL  30 mL Oral Q4H PRN Money, Lowry Ram, FNP      . ARIPiprazole (ABILIFY) tablet 20 mg  20 mg Oral Daily Clapacs, [name], MD   20 mg  at 06/06/19 0835  . divalproex (DEPAKOTE) DR tablet 500 mg  500 mg Oral Q12H Money, Lowry Ram, FNP   500 mg at 06/06/19 3614  . hydrOXYzine (ATARAX/VISTARIL) tablet 25 mg  25 mg Oral TID PRN Money, Lowry Ram, FNP   25 mg at 06/05/19 2136  . lithium carbonate (ESKALITH) CR tablet 450 mg  450 mg Oral Q12H Clapacs, [name], MD   450 mg at 06/06/19 0835  . OLANZapine zydis (ZYPREXA) disintegrating tablet 10 mg  10 mg Oral Q8H PRN Sharma Covert, MD       And  . LORazepam (ATIVAN) tablet 1 mg  1 mg Oral PRN Sharma Covert, MD       And  . ziprasidone (GEODON) injection 20 mg  20 mg Intramuscular PRN Sharma Covert, MD      . magnesium hydroxide (MILK OF MAGNESIA) suspension 30 mL  30 mL Oral Daily PRN Money, Darnelle Maffucci B, FNP      . nicotine (NICODERM CQ - dosed in mg/24 hours) patch 14 mg  14 mg Transdermal Daily Sharma Covert, MD   14 mg at 06/06/19 0837  . traZODone (DESYREL) tablet 200 mg  200 mg Oral QHS Money, Lowry Ram, FNP   200 mg at 06/05/19 2136    Lab Results: No results found for this or any previous visit (from the past 48 hour(s)).  Blood Alcohol level:  Lab Results  Component Value Date   ETH <10 06/02/2019   ETH <10 43/15/4008    Metabolic Disorder Labs: Lab Results  Component Value Date   HGBA1C 5.0 11/21/2018   MPG 96.8 11/21/2018   MPG 93.93 04/13/2018   No results found for: PROLACTIN Lab Results  Component Value Date   CHOL 196 11/21/2018   TRIG 97 11/21/2018   HDL 53 11/21/2018   CHOLHDL 3.7 11/21/2018   VLDL 19 11/21/2018   LDLCALC 124 (H) 11/21/2018   LDLCALC 117 (H) 04/13/2018    Physical Findings: AIMS:  , ,  ,  ,    CIWA:    COWS:     Musculoskeletal: Strength & Muscle Tone: within normal limits Gait & Station: normal Patient leans: N/A  Psychiatric Specialty Exam: Physical Exam  Nursing note and vitals reviewed. Constitutional: She appears well-developed and well-nourished.  HENT:  Head: Normocephalic and atraumatic.  Eyes:  Pupils are equal, round, and reactive to light. Conjunctivae are normal.  Neck: Normal  range of motion.  Cardiovascular: Regular rhythm and normal heart sounds.  Respiratory: Effort normal. No respiratory distress.  GI: Soft.  Musculoskeletal: Normal range of motion.  Neurological: She is alert.  Skin: Skin is warm and dry.  Psychiatric: Her affect is blunt. Her speech is delayed. She is slowed. Thought content is not paranoid. Cognition and memory are impaired. She expresses impulsivity. She expresses no homicidal and no suicidal ideation.    Review of Systems  Constitutional: Negative.   HENT: Negative.   Eyes: Negative.   Respiratory: Negative.   Cardiovascular: Negative.   Gastrointestinal: Negative.   Musculoskeletal: Negative.   Skin: Negative.   Neurological: Negative.   Psychiatric/Behavioral: Negative.     Blood pressure 113/64, pulse 80, temperature 98.7 F (37.1 C), temperature source Oral, resp. rate 17, height 5\' 1"  (1.549 m), weight 51.4 kg, SpO2 100 %.Body mass index is 21.41 kg/m.  General Appearance: Casual  Eye Contact:  Good  Speech:  Slow  Volume:  Decreased  Mood:  Euthymic  Affect:  Constricted  Thought Process:  Goal Directed  Orientation:  Full (Time, Place, and Person)  Thought Content:  Logical  Suicidal Thoughts:  No  Homicidal Thoughts:  No  Memory:  Immediate;   Fair Recent;   Fair Remote;   Fair  Judgement:  Fair  Insight:  Fair  Psychomotor Activity:  Decreased  Concentration:  Concentration: Fair  Recall:  [employer] of Knowledge:  Fair  Language:  Fair  Akathisia:  No  Handed:  Right  AIMS (if indicated):     Assets:  Desire for Improvement  ADL's:  Intact  Cognition:  WNL  Sleep:  Number of Hours: 8     Treatment Plan Summary: Daily contact with patient to assess and evaluate symptoms and progress in treatment, Medication management and Plan Today she reports she is feeling better.  Affect still a little blunted.  Not  psychotic however and denies any suicidal ideation.  Responds well to conversation and therapy.  I do not see any reason to change her antipsychotic or medication at this point given her positive response to current meds.  Encourage patient to get up and be more interactive and we will keep trying to work on discharge planning hopefully within the next couple days.  Alethia Berthold, MD 06/06/2019, 5:13 PM

## 2019-06-06 NOTE — [facility] Group Notes (Signed)
LCSW Group Therapy Note  06/06/2019 11:46 AM  Type of Therapy/Topic:  Group Therapy:  Emotion Regulation  Participation Level:  Did Not Attend   Description of Group:   The purpose of this group is to assist patients in learning to regulate negative emotions and experience positive emotions. Patients will be guided to discuss ways in which they have been vulnerable to their negative emotions. These vulnerabilities will be juxtaposed with experiences of positive emotions or situations, and patients will be challenged to use positive emotions to combat negative ones. Special emphasis will be placed on coping with negative emotions in conflict situations, and patients will process healthy conflict resolution skills.  Therapeutic Goals: 1. Patient will identify two positive emotions or experiences to reflect on in order to balance out negative emotions 2. Patient will label two or more emotions that they find the most difficult to experience 3. Patient will demonstrate positive conflict resolution skills through discussion and/or role plays  Summary of Patient Progress: x   Therapeutic Modalities:   Cognitive Behavioral Therapy Feelings Identification Dialectical Behavioral Therapy   Evalina Field, MSW, LCSW Clinical Social Work 06/06/2019 11:46 AM

## 2019-06-06 NOTE — B[facility] Counselor (Signed)
CSW received call from the patient's roommate/boyfriend.  He called to report that the patient is able to return home.  He reports that he wants to support the patient but he feels overwhelmed sometimes.  He reports that he is looking for some type of support.  CSW advised that he do what he feels he needs to and assessed for safety.  He wanted additional information on filing for Social Security and CSW informed that patient would need to make that decision on her own and follow up on her own as she is her own guardian.  CSW also reported that they would need to contact SSI on their own.   Assunta Curtis, MSW, LCSW 06/06/2019 3:45 PM

## 2019-06-06 NOTE — Plan of Care (Signed)
Pt takes her medications voluntarily. She is cooperative and in the milieu. She does stay in her room a  lot but she is not disruptive. Pt can communicate her physical needs adequately. Had a headache and was given Tylenol.

## 2019-06-07 MED ORDER — LITHIUM CARBONATE ER 450 MG PO TBCR: 450.0000 mg | EXTENDED_RELEASE_TABLET | Freq: Two times a day (BID) | ORAL | 0 refills | Status: DC

## 2019-06-07 MED ORDER — HYDROXYZINE HCL 25 MG PO TABS: 25.0000 mg | ORAL_TABLET | Freq: Three times a day (TID) | ORAL | 0 refills | Status: DC | PRN

## 2019-06-07 MED ORDER — DIVALPROEX SODIUM 500 MG PO DR TAB: 500.0000 mg | DELAYED_RELEASE_TABLET | Freq: Two times a day (BID) | ORAL | 0 refills | Status: DC

## 2019-06-07 MED ORDER — TRAZODONE HCL 100 MG PO TABS: 200.0000 mg | ORAL_TABLET | Freq: Every day | ORAL | 0 refills | Status: DC

## 2019-06-07 MED ORDER — ARIPIPRAZOLE 20 MG PO TABS: 20.0000 mg | ORAL_TABLET | Freq: Every day | ORAL | 0 refills | Status: DC

## 2019-06-07 NOTE — Progress Notes (Signed)
Recreation Therapy Notes  Date: 06/07/2019  Time: 9:30 am  Location: Craft room  Behavioral response: Appropriate   Intervention Topic: Self-care  Discussion/Intervention:  Group content today was focused on Self-Care. The group defined self-care and some positive ways they care for themselves. Individuals expressed ways and reasons why they neglected any self-care in the past. Patients described ways to improve self-care in the future. The group explained what could happen if they did not do any self-care activities at all. The group participated in the intervention "self-care assessment" where they had a chance to discover some of their weaknesses and strengths in self- care. Patient came up with a self-care plan to improve themselves in the future.  Clinical Observations/Feedback:  Patient came to group late due to unknown reasons. She stated she could improve her self-care by not asking her friends to do bad things for her. Individual was social with peers and staff while participating in the intervention.  Cheri Ayotte LRT/CTRS         Javell Blackburn 06/07/2019 11:05 AM

## 2019-06-07 NOTE — Plan of Care (Signed)
Patient denies anxiety with this Probation officer but presents with an anxious affect. Patient was isolative to her room this evening and minimal with staff and peers on the unit.   Problem: Self-Concept: Goal: Level of anxiety will decrease Outcome: Not Progressing    Problem: Education: Goal: Emotional status will improve Outcome: Not Progressing

## 2019-06-07 NOTE — Plan of Care (Signed)
Pt. Denies si/hi/avh, able to remain safe while on the unit. Pt. Monitored for safety. Pt. Participation is fair on the unit. Pt. Self-control observed improved.    Problem: Education: Goal: Emotional status will improve Outcome: Progressing Goal: Mental status will improve Outcome: Progressing   Problem: Activity: Goal: Interest or engagement in activities will improve Outcome: Progressing   Problem: Coping: Goal: Ability to demonstrate self-control will improve Outcome: Progressing

## 2019-06-07 NOTE — Progress Notes (Signed)
D: Pt during assessments denies SI/HI/AVH, able to contract for safety. Pt is pleasant and cooperative. Pt. Endorses a normal mood, but minimal and a bit guarded.  A: Q x 15 minute observation checks to be completed for safety. Patient was provided with education.  Patient was given/offered medications per orders. Patient  was encouraged to attend groups, participate in unit activities and continue with plan of care. Pt. Chart and plans of care reviewed. Pt. Given support and encouragement.   R: Patient is complaint with medication and unit procedures this morning. Pt. Observed eating good breakfast. Pt. participation fair.

## 2019-06-07 NOTE — B[facility] Group Notes (Signed)
Balance In Life 06/07/2019 1PM  Type of Therapy/Topic:  Group Therapy:  Balance in Life  Participation Level:  Did Not Attend  Description of Group:   This group will address the concept of balance and how it feels and looks when one is unbalanced. Patients will be encouraged to process areas in their lives that are out of balance and identify reasons for remaining unbalanced. Facilitators will guide patients in utilizing problem-solving interventions to address and correct the stressor making their life unbalanced. Understanding and applying boundaries will be explored and addressed for obtaining and maintaining a balanced life. Patients will be encouraged to explore ways to assertively make their unbalanced needs known to significant others in their lives, using other group members and facilitator for support and feedback.  Therapeutic Goals: 1. Patient will identify two or more emotions or situations they have that consume much of in their lives. 2. Patient will identify signs/triggers that life has become out of balance:  3. Patient will identify two ways to set boundaries in order to achieve balance in their lives:  4. Patient will demonstrate ability to communicate their needs through discussion and/or role plays  Summary of Patient Progress:    Therapeutic Modalities:   Cognitive Behavioral Therapy Solution-Focused Therapy Assertiveness Training  Davaris Youtsey Lynelle Smoke, LCSW

## 2019-06-07 NOTE — Progress Notes (Signed)
[facility] MD Progress Note  06/07/2019 4:52 PM Autumn [name]  MRN:  [id number] Subjective: No change to current presentation.  Slightly dysphoric but not severely depressed.  Denies suicidal ideation.  No physical complaints.  Attending groups a little bit better.  Insight is improved.  No sign of psychosis or acute mania Principal Problem: Bipolar I disorder, most recent episode (or current) manic (HCC) Diagnosis: Principal Problem:   Bipolar I disorder, most recent episode (or current) manic ([place]) Active Problems:   Cannabis use disorder, severe, dependence ([place])  Total Time spent with patient: 30 minutes  Past Psychiatric History: Patient has a history of bipolar disorder and substance abuse  Past Medical History:  Past Medical History:  Diagnosis Date  . Allergy    Seasonal, Ultram (seizures)  . Anemia 2005  . Bipolar 1 disorder ([place])   . Glaucoma   . Seizures ([place]) 2008    Past Surgical History:  Procedure Laterality Date  . BREAST BIOPSY Right    x 2  . CESAREAN SECTION     x 2   Family History:  Family History  Problem Relation Age of Onset  . Hypertension Mother   . Hyperlipidemia Mother   . Parkinson's disease Father    Family Psychiatric  History: See previous Social History:  Social History   Substance and Sexual Activity  Alcohol Use Yes  . Alcohol/week: 4.0 standard drinks  . Types: 4 Cans of beer per week   Comment: Ocassional     Social History   Substance and Sexual Activity  Drug Use Yes  . Types: Marijuana   Comment: percocet -last use over 1 week ago. Heroin-last used 2/11    Social History   Socioeconomic History  . Marital status: Single    Spouse name: Not on file  . Number of children: Not on file  . Years of education: Not on file  . Highest education level: Not on file  Occupational History  . Not on file  Social Needs  . Financial resource strain: Not on file  . Food insecurity    Worry: Not on file    Inability: Not on  file  . Transportation needs    Medical: Not on file    Non-medical: Not on file  Tobacco Use  . Smoking status: Current Every Day Smoker    Packs/day: 1.00    Years: 9.00    Pack years: 9.00  . Smokeless tobacco: Never Used  . Tobacco comment: Patient not interested  Substance and Sexual Activity  . Alcohol use: Yes    Alcohol/week: 4.0 standard drinks    Types: 4 Cans of beer per week    Comment: Ocassional  . Drug use: Yes    Types: Marijuana    Comment: percocet -last use over 1 week ago. Heroin-last used 2/11  . Sexual activity: Yes    Birth control/protection: None  Lifestyle  . Physical activity    Days per week: Not on file    Minutes per session: Not on file  . Stress: Not on file  Relationships  . Social Herbalist on phone: Not on file    Gets together: Not on file    Attends religious service: Not on file    Active member of club or organization: Not on file    Attends meetings of clubs or organizations: Not on file    Relationship status: Not on file  Other Topics Concern  . Not on file  Social History Narrative  . Not on file   Additional Social History:    History of alcohol / drug use?: No history of alcohol / drug abuse                    Sleep: Fair  Appetite:  Fair  Current Medications: Current Facility-Administered Medications  Medication Dose Route Frequency Provider Last Rate Last Dose  . acetaminophen (TYLENOL) tablet 650 mg  650 mg Oral Q6H PRN Money, Lowry Ram, FNP   650 mg at 06/06/19 2125  . alum & mag hydroxide-simeth (MAALOX/MYLANTA) 200-200-20 MG/5ML suspension 30 mL  30 mL Oral Q4H PRN Money, Lowry Ram, FNP      . ARIPiprazole (ABILIFY) tablet 20 mg  20 mg Oral Daily Clapacs, [name], MD   20 mg at 06/07/19 0805  . divalproex (DEPAKOTE) DR tablet 500 mg  500 mg Oral Q12H Money, Lowry Ram, FNP   500 mg at 06/07/19 0805  . hydrOXYzine (ATARAX/VISTARIL) tablet 25 mg  25 mg Oral TID PRN Money, Lowry Ram, FNP   25 mg at  06/05/19 2136  . lithium carbonate (ESKALITH) CR tablet 450 mg  450 mg Oral Q12H Clapacs, [name], MD   450 mg at 06/07/19 0806  . OLANZapine zydis (ZYPREXA) disintegrating tablet 10 mg  10 mg Oral Q8H PRN Sharma Covert, MD       And  . LORazepam (ATIVAN) tablet 1 mg  1 mg Oral PRN Sharma Covert, MD       And  . ziprasidone (GEODON) injection 20 mg  20 mg Intramuscular PRN Sharma Covert, MD      . magnesium hydroxide (MILK OF MAGNESIA) suspension 30 mL  30 mL Oral Daily PRN Money, Darnelle Maffucci B, FNP      . nicotine (NICODERM CQ - dosed in mg/24 hours) patch 14 mg  14 mg Transdermal Daily Sharma Covert, MD   14 mg at 06/07/19 0805  . traZODone (DESYREL) tablet 200 mg  200 mg Oral QHS Money, Lowry Ram, FNP   200 mg at 06/06/19 2110    Lab Results: No results found for this or any previous visit (from the past 48 hour(s)).  Blood Alcohol level:  Lab Results  Component Value Date   ETH <10 06/02/2019   ETH <10 09/60/4540    Metabolic Disorder Labs: Lab Results  Component Value Date   HGBA1C 5.0 11/21/2018   MPG 96.8 11/21/2018   MPG 93.93 04/13/2018   No results found for: PROLACTIN Lab Results  Component Value Date   CHOL 196 11/21/2018   TRIG 97 11/21/2018   HDL 53 11/21/2018   CHOLHDL 3.7 11/21/2018   VLDL 19 11/21/2018   LDLCALC 124 (H) 11/21/2018   LDLCALC 117 (H) 04/13/2018    Physical Findings: AIMS:  , ,  ,  ,    CIWA:    COWS:     Musculoskeletal: Strength & Muscle Tone: within normal limits Gait & Station: normal Patient leans: N/A  Psychiatric Specialty Exam: Physical Exam  Nursing note and vitals reviewed. Constitutional: She appears well-developed and well-nourished.  HENT:  Head: Normocephalic and atraumatic.  Eyes: Pupils are equal, round, and reactive to light. Conjunctivae are normal.  Neck: Normal range of motion.  Cardiovascular: Regular rhythm and normal heart sounds.  Respiratory: Effort normal. No respiratory distress.  GI:  Soft.  Musculoskeletal: Normal range of motion.  Neurological: She is alert.  Skin: Skin is warm and dry.  Psychiatric: Judgment normal. Her affect is blunt. Her speech is delayed. She is slowed. Thought content is not paranoid. Cognition and memory are normal. She expresses no homicidal and no suicidal ideation.    Review of Systems  Constitutional: Negative.   HENT: Negative.   Eyes: Negative.   Respiratory: Negative.   Cardiovascular: Negative.   Gastrointestinal: Negative.   Musculoskeletal: Negative.   Skin: Negative.   Neurological: Negative.   Psychiatric/Behavioral: Negative.     Blood pressure 95/65, pulse 64, temperature 98.2 F (36.8 C), temperature source Oral, resp. rate 18, height 5\' 1"  (1.549 m), weight 51.4 kg, SpO2 100 %.Body mass index is 21.41 kg/m.  General Appearance: Casual  Eye Contact:  Fair  Speech:  Normal Rate  Volume:  Normal  Mood:  Euthymic  Affect:  Congruent  Thought Process:  Goal Directed  Orientation:  Full (Time, Place, and Person)  Thought Content:  Logical  Suicidal Thoughts:  No  Homicidal Thoughts:  No  Memory:  Immediate;   Fair Recent;   Fair Remote;   Fair  Judgement:  Fair  Insight:  Fair  Psychomotor Activity:  Decreased  Concentration:  Concentration: Fair  Recall:  [employer] of Knowledge:  Fair  Language:  Fair  Akathisia:  No  Handed:  Right  AIMS (if indicated):     Assets:  Desire for Improvement  ADL's:  Intact  Cognition:  WNL  Sleep:  Number of Hours: 8     Treatment Plan Summary: Daily contact with patient to assess and evaluate symptoms and progress in treatment, Medication management and Plan Continue current medicine including Abilify.  Supportive counseling.  Patient appears to be stabilizing and likely can be discharged within the next day.  I will go ahead and start making plans and the patient has been encouraged to work on plans to go back home and follow-up with outpatient treatment.  Alethia Berthold,  MD 06/07/2019, 4:52 PM

## 2019-06-07 NOTE — Progress Notes (Signed)
D -Patient was in her room upon arrival to the unit. Patient was pleasant but isolative this evening. Patient was compliant with medication administration and procedures on the unit. Patient denies SI/HI/AVH, anxiety and depression with this Probation officer.Patient was isolative to her room this evening but did get up for snack and medications. Patient did request some PRN pain medication because she worked out this morning as was feeling sore.   A - Patient didn't have any medication scheduled tonight. Patient was compliant with procedures on the unit. Patient given education. Patient given support and encouragement to be active in her treatment plan. Patient informed to let staff know if there are any issues or problems on the unit.   R - Patient being monitored Q 15 minutes for safety per unit protocol. Patient remains safe on the unit.

## 2019-06-08 LAB — VALPROIC ACID LEVEL: Valproic Acid Lvl: 92 ug/mL (ref 50.0–100.0)

## 2019-06-08 LAB — LITHIUM LEVEL: Lithium Lvl: 0.7 mmol/L (ref 0.60–1.20)

## 2019-06-08 MED ORDER — ARIPIPRAZOLE 20 MG PO TABS: 20.0000 mg | ORAL_TABLET | Freq: Every day | ORAL | 2 refills | Status: DC

## 2019-06-08 MED ORDER — DIVALPROEX SODIUM 500 MG PO DR TAB: 500.0000 mg | DELAYED_RELEASE_TABLET | Freq: Two times a day (BID) | ORAL | 2 refills | Status: DC

## 2019-06-08 MED ORDER — HYDROXYZINE HCL 25 MG PO TABS: 25.0000 mg | ORAL_TABLET | Freq: Three times a day (TID) | ORAL | 2 refills | Status: DC | PRN

## 2019-06-08 MED ORDER — TRAZODONE HCL 100 MG PO TABS: 200.0000 mg | ORAL_TABLET | Freq: Every day | ORAL | 2 refills | Status: DC

## 2019-06-08 MED ORDER — LITHIUM CARBONATE ER 450 MG PO TBCR: 450.0000 mg | EXTENDED_RELEASE_TABLET | Freq: Two times a day (BID) | ORAL | 2 refills | Status: DC

## 2019-06-08 NOTE — Plan of Care (Signed)
Patient to be discharged today per MD order

## 2019-06-08 NOTE — Progress Notes (Signed)
Recreation Therapy Notes  INPATIENT RECREATION TR PLAN  Patient Details Name: Autumn [name] MRN: [id number] DOB: [birth year] Today's Date: 06/08/2019  Rec Therapy Plan Is patient appropriate for Therapeutic Recreation?: Yes Treatment times per week: at least 3 Estimated Length of Stay: 5-7 days TR Treatment/Interventions: Group participation (Comment)  Discharge Criteria Pt will be discharged from therapy if:: Discharged Treatment plan/goals/alternatives discussed and agreed upon by:: Patient/family  Discharge Summary Short term goals set: Patient will successfully identify 2 ways of making healthy decisions post d/c within 5 recreation therapy group sessions Short term goals met: Adequate for discharge Progress toward goals comments: Groups attended Which groups?: Other (Comment)(Self-care, Problem Solving) Reason goals not met: N/A Therapeutic equipment acquired: N/A Reason patient discharged from therapy: Discharge from hospital Pt/family agrees with progress & goals achieved: Yes Date patient discharged from therapy: 06/08/19   Rael Yo 06/08/2019, 12:59 PM

## 2019-06-08 NOTE — Progress Notes (Signed)
Patient discharged to current residence per MD order. Discharge instructions provided and patient verbalized understanding.  Medication Prescriptions and samples provided. Belongings returned. Denied Suicidal thoughts upon discharge.

## 2019-06-08 NOTE — Progress Notes (Signed)
  [facility] Adult Case Management Discharge Plan :  Will you be returning to the same living situation after discharge:  Yes,  home At discharge, do you have transportation home?: Yes,  bf will pick pt up Do you have the ability to pay for your medications: Yes,  mental health  Release of information consent forms completed and in the chart;   Patient to Follow up at: Follow-up Information    [facility] Follow up on 06/21/2019.   Why: You have an appointment scheduled for 06/21/19 at 3:00 pm. Please take discharge papers, list of current meds, and photo id with you to your appointment. Thank You! Contact information: [place] [address] [phone]           Next level of care provider has access to [place] and Suicide Prevention discussed: Yes,  SPE completed with pts bf  Have you used any form of tobacco in the last 30 days? (Cigarettes, Smokeless Tobacco, Cigars, and/or Pipes): Yes  Has patient been referred to the Quitline?: Patient refused referral  Patient has been referred for addiction treatment: Pt. refused referral  [place], LCSW 06/08/2019, 9:04 AM

## 2019-06-08 NOTE — Plan of Care (Signed)
  Problem: Decision Making Goal: STG - Patient will successfully identify 2 ways of making healthy decisions post d/c within 5 recreation therapy group sessions Description: STG - Patient will successfully identify 2 ways of making healthy decisions post d/c within 5 recreation therapy group sessions 06/08/2019 1257 by Ernest Haber, LRT Outcome: Adequate for Discharge 06/08/2019 1257 by Ernest Haber, LRT Outcome: Adequate for Discharge

## 2019-06-08 NOTE — Discharge Summary (Signed)
Physician Discharge Summary Note  Patient:  Autumn [name] is an 43 y.o., female MRN:  [id number] DOB:  1976-08-31 Patient phone:  [phone] (home)  Patient address:   [place] [address],  Total Time spent with patient: 45 minutes  Date of Admission:  06/02/2019 Date of Discharge: June 08, 2019  Reason for Admission: Admitted to the hospital because of confusion suicidal behavior depression  Principal Problem: Bipolar I disorder, most recent episode (or current) manic ([place]) Discharge Diagnoses: Principal Problem:   Bipolar I disorder, most recent episode (or current) manic ([place]) Active Problems:   Cannabis use disorder, severe, dependence ([place])   Past Psychiatric History: Patient has a history of bipolar disorder  Past Medical History:  Past Medical History:  Diagnosis Date  . Allergy    Seasonal, Ultram (seizures)  . Anemia 2005  . Bipolar 1 disorder ([place])   . Glaucoma   . Seizures ([place]) 2008    Past Surgical History:  Procedure Laterality Date  . BREAST BIOPSY Right    x 2  . CESAREAN SECTION     x 2   Family History:  Family History  Problem Relation Age of Onset  . Hypertension Mother   . Hyperlipidemia Mother   . Parkinson's disease Father    Family Psychiatric  History: See previous Social History:  Social History   Substance and Sexual Activity  Alcohol Use Yes  . Alcohol/week: 4.0 standard drinks  . Types: 4 Cans of beer per week   Comment: Ocassional     Social History   Substance and Sexual Activity  Drug Use Yes  . Types: Marijuana   Comment: percocet -last use over 1 week ago. Heroin-last used 2/11    Social History   Socioeconomic History  . Marital status: Single    Spouse name: Not on file  . Number of children: Not on file  . Years of education: Not on file  . Highest education level: Not on file  Occupational History  . Not on file  Social Needs  . Financial resource strain: Not on file  . Food  insecurity    Worry: Not on file    Inability: Not on file  . Transportation needs    Medical: Not on file    Non-medical: Not on file  Tobacco Use  . Smoking status: Current Every Day Smoker    Packs/day: 1.00    Years: 9.00    Pack years: 9.00  . Smokeless tobacco: Never Used  . Tobacco comment: Patient not interested  Substance and Sexual Activity  . Alcohol use: Yes    Alcohol/week: 4.0 standard drinks    Types: 4 Cans of beer per week    Comment: Ocassional  . Drug use: Yes    Types: Marijuana    Comment: percocet -last use over 1 week ago. Heroin-last used 2/11  . Sexual activity: Yes    Birth control/protection: None  Lifestyle  . Physical activity    Days per week: Not on file    Minutes per session: Not on file  . Stress: Not on file  Relationships  . Social Herbalist on phone: Not on file    Gets together: Not on file    Attends religious service: Not on file    Active member of club or organization: Not on file    Attends meetings of clubs or organizations: Not on file    Relationship status: Not on file  Other Topics Concern  . Not on file  Social History Narrative  . Not on file    Hospital Course: Patient was engaged in 15-minute checks.  Did not show dangerous violent or aggressive behavior.  Attended groups and individual counseling.  Medications adjusted.  Restarted antipsychotic medicine.  Patient showed improvement in mood.  Decrease in psychotic symptoms with eventually full remission of psychosis.  Showed good insight and behavior.  At the time of discharge was denying any suicidal thoughts appeared to be calm with appropriate affect and behavior  Physical Findings: AIMS:  , ,  ,  ,    CIWA:    COWS:     Musculoskeletal: Strength & Muscle Tone: within normal limits Gait & Station: normal Patient leans: N/A  Psychiatric Specialty Exam: Physical Exam  Nursing note and vitals reviewed. Constitutional: She appears well-developed and  well-nourished.  HENT:  Head: Normocephalic and atraumatic.  Eyes: Pupils are equal, round, and reactive to light. Conjunctivae are normal.  Neck: Normal range of motion.  Cardiovascular: Regular rhythm and normal heart sounds.  Respiratory: Effort normal. No respiratory distress.  GI: Soft.  Musculoskeletal: Normal range of motion.  Neurological: She is alert.  Skin: Skin is warm and dry.  Psychiatric: She has a normal mood and affect. Her speech is normal and behavior is normal. Judgment and thought content normal. Cognition and memory are normal.    Review of Systems  Constitutional: Negative.   HENT: Negative.   Eyes: Negative.   Respiratory: Negative.   Cardiovascular: Negative.   Gastrointestinal: Negative.   Musculoskeletal: Negative.   Skin: Negative.   Neurological: Negative.   Psychiatric/Behavioral: Negative.     Blood pressure 105/70, pulse 61, temperature (!) 97.5 F (36.4 C), temperature source Oral, resp. rate 18, height 5\' 1"  (1.549 m), weight 51.4 kg, SpO2 100 %.Body mass index is 21.41 kg/m.  General Appearance: Casual  Eye Contact:  Good  Speech:  Clear and Coherent  Volume:  Normal  Mood:  Euthymic  Affect:  Constricted  Thought Process:  Coherent  Orientation:  Full (Time, Place, and Person)  Thought Content:  Logical  Suicidal Thoughts:  No  Homicidal Thoughts:  No  Memory:  Immediate;   Fair Recent;   Fair Remote;   Fair  Judgement:  Fair  Insight:  Fair  Psychomotor Activity:  Normal  Concentration:  Concentration: Fair  Recall:  [place] of Knowledge:  Fair  Language:  Fair  Akathisia:  No  Handed:  Right  AIMS (if indicated):     Assets:  Desire for Improvement  ADL's:  Intact  Cognition:  WNL  Sleep:  Number of Hours: 7     Have you used any form of tobacco in the last 30 days? (Cigarettes, Smokeless Tobacco, Cigars, and/or Pipes): Yes  Has this patient used any form of tobacco in the last 30 days? (Cigarettes, Smokeless  Tobacco, Cigars, and/or Pipes) Yes, No  Blood Alcohol level:  Lab Results  Component Value Date   ETH <10 06/02/2019   ETH <10 39/01/91    Metabolic Disorder Labs:  Lab Results  Component Value Date   HGBA1C 5.0 11/21/2018   MPG 96.8 11/21/2018   MPG 93.93 04/13/2018   No results found for: PROLACTIN Lab Results  Component Value Date   CHOL 196 11/21/2018   TRIG 97 11/21/2018   HDL 53 11/21/2018   CHOLHDL 3.7 11/21/2018   VLDL 19 11/21/2018   LDLCALC 124 (H) 11/21/2018  LDLCALC 117 (H) 04/13/2018    See Psychiatric Specialty Exam and Suicide Risk Assessment completed by Attending Physician prior to discharge.  Discharge destination:  Home  Is patient on multiple antipsychotic therapies at discharge:  No   Has Patient had three or more failed trials of antipsychotic monotherapy by history:  No  Recommended Plan for Multiple Antipsychotic Therapies: NA  Discharge Instructions    Diet - low sodium heart healthy   Complete by: As directed    Increase activity slowly   Complete by: As directed      Allergies as of 06/08/2019      Reactions   Haldol [haloperidol Lactate]    Patient states that she gets stiff muscles when taking Haldol   Ultram [tramadol] Other (See Comments)   Seizures      Medication List    STOP taking these medications   buPROPion 150 MG 24 hr tablet Commonly known as: WELLBUTRIN XL     TAKE these medications     Indication  ARIPiprazole 20 MG tablet Commonly known as: ABILIFY Take 1 tablet (20 mg total) by mouth daily.  Indication: MIXED BIPOLAR AFFECTIVE DISORDER   divalproex 500 MG DR tablet Commonly known as: DEPAKOTE Take 1 tablet (500 mg total) by mouth every 12 (twelve) hours.  Indication: Depressive Phase of Manic-Depression, mood stability   hydrOXYzine 25 MG tablet Commonly known as: ATARAX/VISTARIL Take 1 tablet (25 mg total) by mouth 3 (three) times daily as needed for anxiety.  Indication: Feeling Anxious    lithium carbonate 450 MG CR tablet Commonly known as: ESKALITH Take 1 tablet (450 mg total) by mouth every 12 (twelve) hours.  Indication: Hypomanic Episode of Bipolar Disorder, Major Depressive Disorder   Ortho Tri-Cyclen (28) 0.18/0.215/0.25 MG-35 MCG tablet Generic drug: Norgestimate-Ethinyl Estradiol Triphasic Take 1 tablet by mouth daily.  Indication: Dysfunctional Bleeding From the Uterus   traZODone 100 MG tablet Commonly known as: DESYREL Take 2 tablets (200 mg total) by mouth at bedtime.  Indication: Trouble Sleeping      Follow-up Information    [facility] Follow up on 06/21/2019.   Why: You have an appointment scheduled for 06/21/19 at 3:00 pm. Please take discharge papers, list of current meds, and photo id with you to your appointment. Thank You! Contact information: [address] [place] [address] [phone]           Follow-up recommendations:  Activity:  Activity as tolerated Diet:  Regular diet Other:  Follow-up with outpatient treatment with [facility]  Comments: See previous notes.  Prescriptions given.  On the day of discharge lithium and Depakote levels were checked and both were well within the therapeutic limit.  Signed: Alethia Berthold, MD 06/08/2019, 5:57 PM

## 2019-06-08 NOTE — [facility] Group Notes (Signed)

## 2019-06-08 NOTE — Progress Notes (Signed)
Recreation Therapy Notes  Date: 06/08/2019  Time: 9:30 am   Location: Craft room   Behavioral response: N/A   Intervention Topic: Coping Skills  Discussion/Intervention: Patient did not attend group.   Clinical Observations/Feedback:  Patient did not attend group.   Bettylee Feig LRT/CTRS        Porche Steinberger 06/08/2019 11:13 AM

## 2019-06-08 NOTE — Progress Notes (Signed)
D -Patient was in her room upon arrival to the unit. Patient was pleasant but isolative this evening. Patient was compliant with medication administration and procedures on the unit. Patient denies SI/HI/AVH, anxiety and depression with this Probation officer.Patient was isolative to her room this evening but did get up for snack and medications.   A - Patient didn't have any medication scheduled tonight. Patient was compliant with procedures on the unit. Patient given education. Patient given support and encouragement to be active in her treatment plan. Patient informed to let staff know if there are any issues or problems on the unit.   R - Patient being monitored Q 15 minutes for safety per unit protocol. Patient remains safe on the unit.

## 2019-06-08 NOTE — BHH Suicide Risk Assessment (Signed)
[facility] Discharge Suicide Risk Assessment   Principal Problem: Bipolar I disorder, most recent episode (or current) manic ([place]) Discharge Diagnoses: Principal Problem:   Bipolar I disorder, most recent episode (or current) manic ([place]) Active Problems:   Cannabis use disorder, severe, dependence ([place])   Total Time spent with patient: 45 minutes  Musculoskeletal: Strength & Muscle Tone: within normal limits Gait & Station: normal Patient leans: N/A  Psychiatric Specialty Exam: Review of Systems  Constitutional: Negative.   HENT: Negative.   Eyes: Negative.   Respiratory: Negative.   Cardiovascular: Negative.   Gastrointestinal: Negative.   Musculoskeletal: Negative.   Skin: Negative.   Neurological: Negative.   Psychiatric/Behavioral: Negative.     Blood pressure 105/70, pulse 61, temperature (!) 97.5 F (36.4 C), temperature source Oral, resp. rate 18, height 5\' 1"  (1.549 m), weight 51.4 kg, SpO2 100 %.Body mass index is 21.41 kg/m.  General Appearance: Casual  Eye Contact::  Fair  Speech:  Clear and [id number]  Volume:  Normal  Mood:  Euthymic  Affect:  Congruent  Thought Process:  Goal Directed  Orientation:  Full (Time, Place, and Person)  Thought Content:  Logical  Suicidal Thoughts:  No  Homicidal Thoughts:  No  Memory:  Immediate;   Fair Recent;   Fair Remote;   Fair  Judgement:  Fair  Insight:  Fair  Psychomotor Activity:  Normal  Concentration:  Fair  Recall:  [employer] [place]  Language: Fair  Akathisia:  No  Handed:  Right  AIMS (if indicated):     Assets:  Desire for Improvement Housing Physical Health  Sleep:  Number of Hours: 7  Cognition: WNL  ADL's:  Intact   Mental Status Per Nursing Assessment::   On Admission:  NA  Demographic Factors:  NA  Loss Factors: Loss of significant relationship  Historical Factors: Prior suicide attempts and Impulsivity  Risk Reduction Factors:   Living with another person, especially a  relative, Positive social support and Positive therapeutic relationship  Continued Clinical Symptoms:  Bipolar Disorder:   Mixed State  Cognitive Features That Contribute To Risk:  None    Suicide Risk:  Minimal: No identifiable suicidal ideation.  Patients presenting with no risk factors but with morbid ruminations; may be classified as minimal risk based on the severity of the depressive symptoms  Follow-up Information    [facility] [employer] Follow up on 06/21/2019.   Why: You have an appointment scheduled for 06/21/19 at 3:00 pm. Please take discharge papers, list of current meds, and photo id with you to your appointment. Thank You! Contact information: [address] [name] [address] [phone]           Plan Of Care/Follow-up recommendations:  Activity:  as tolerated Diet:  regular Other:  follow up with outpatient treatment  Alethia Berthold, MD 06/08/2019, 9:14 AM

## 2019-06-08 NOTE — Plan of Care (Signed)
Patient isolative to her room this evening, but was compliant with medication administration per MD orders.   Problem: Education: Goal: Emotional status will improve Outcome: Not Progressing Goal: Mental status will improve Outcome: Not Progressing

## 2019-06-20 ENCOUNTER — Encounter: Payer: Self-pay | Admitting: *Deleted

## 2019-06-20 ENCOUNTER — Other Ambulatory Visit: Payer: Self-pay

## 2019-06-20 ENCOUNTER — Emergency Department
Admission: EM | Admit: 2019-06-20 | Discharge: 2019-06-20 | Disposition: A | Discharge status: Home / Self Care | Payer: Self-pay | Attending: Emergency Medicine | Admitting: Emergency Medicine

## 2019-06-20 DIAGNOSIS — F17210 Nicotine dependence, cigarettes, uncomplicated: Secondary | ICD-10-CM | POA: Insufficient documentation

## 2019-06-20 DIAGNOSIS — Z79899 Other long term (current) drug therapy: Secondary | ICD-10-CM | POA: Insufficient documentation

## 2019-06-20 DIAGNOSIS — K047 Periapical abscess without sinus: Secondary | ICD-10-CM | POA: Insufficient documentation

## 2019-06-20 MED ORDER — LIDOCAINE VISCOUS HCL 2 % MT SOLN: 5.0000 mL | Freq: Four times a day (QID) | OROMUCOSAL | 0 refills | Status: DC | PRN

## 2019-06-20 MED ORDER — OXYCODONE-ACETAMINOPHEN 7.5-325 MG PO TABS: 1.0000 | ORAL_TABLET | Freq: Four times a day (QID) | ORAL | 0 refills | Status: AC | PRN

## 2019-06-20 MED ORDER — LIDOCAINE VISCOUS HCL 2 % MT SOLN
15.0000 mL | Freq: Once | OROMUCOSAL | Status: AC
  Administered 2019-06-20: 15 mL via OROMUCOSAL
  Filled 2019-06-20: qty 15

## 2019-06-20 MED ORDER — AMOXICILLIN 500 MG PO CAPS
500.0000 mg | ORAL_CAPSULE | Freq: Once | ORAL | Status: AC
  Administered 2019-06-20: 500 mg via ORAL
  Filled 2019-06-20: qty 1

## 2019-06-20 MED ORDER — OXYCODONE-ACETAMINOPHEN 5-325 MG PO TABS
1.0000 | ORAL_TABLET | Freq: Once | ORAL | Status: AC
  Administered 2019-06-20: 1 via ORAL
  Filled 2019-06-20: qty 1

## 2019-06-20 MED ORDER — AMOXICILLIN 500 MG PO CAPS: 500.0000 mg | ORAL_CAPSULE | Freq: Three times a day (TID) | ORAL | 0 refills | Status: DC

## 2019-06-20 MED ORDER — IBUPROFEN 600 MG PO TABS
600.0000 mg | ORAL_TABLET | Freq: Once | ORAL | Status: AC
  Administered 2019-06-20: 600 mg via ORAL
  Filled 2019-06-20: qty 1

## 2019-06-20 NOTE — ED Triage Notes (Signed)
Pt states another person pulled her tooth out 2 days ago.  Pt has pain and swelling to left lower jaw area.  Pt alert  Speech clear.

## 2019-06-20 NOTE — ED Provider Notes (Signed)
[facility] Emergency Department Provider Note   ____________________________________________   First MD Initiated Contact with Patient 06/20/19 1659     (approximate)  I have reviewed the triage vital signs and the nursing notes.   HISTORY  Chief Complaint Dental Pain    HPI Autumn [name] is a 43 y.o. female patient presents with dental pain and gum swelling secondary to having a tooth pulled 2 days ago.  Patient tooth was pulled by her friend.  Patient states today she noticed mild edema lateral aspect of the lower mandible.  Patient denies fever/chills.  Patient denies drainage.  Patient rates her pain is 8/10.  Patient described the pain is "achy".  No palliative measure for complaint.         Past Medical History:  Diagnosis Date  . Allergy    Seasonal, Ultram (seizures)  . Anemia 2005  . Bipolar 1 disorder ([place])   . Glaucoma   . Seizures ([place]) 2008    Patient Active Problem List   Diagnosis Date Noted  . Bipolar I disorder, most recent episode (or current) manic ([place]) 06/02/2019  . Affective psychosis, bipolar ([place]) 05/25/2019  . Bipolar affective disorder, current episode manic ([place]) 05/20/2019  . Opiate abuse, continuous ([place]) 11/21/2018  . Bipolar I disorder, most recent episode depressed, severe without psychotic features ([place]) 07/05/2017  . Tobacco use disorder 06/27/2017  . 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 Opioid use disorder, severe, dependence ([place]) 06/27/2017  . Cannabis use disorder, severe, dependence ([place]) 06/27/2017  . Hx of glaucoma 10/02/2013    Past Surgical History:  Procedure Laterality Date  . BREAST BIOPSY Right    x 2  . CESAREAN SECTION     x 2    Prior to Admission medications   Medication Sig Start Date End Date Taking? Authorizing Provider  amoxicillin (AMOXIL) 500 MG capsule Take 1 capsule (500 mg total) by mouth 3 (three) times daily. 06/20/19   Sable Feil, PA-C   ARIPiprazole (ABILIFY) 20 MG tablet Take 1 tablet (20 mg total) by mouth daily. 06/08/19   Clapacs, [name], MD  divalproex (DEPAKOTE) 500 MG DR tablet Take 1 tablet (500 mg total) by mouth every 12 (twelve) hours. 06/08/19   Clapacs, [name], MD  hydrOXYzine (ATARAX/VISTARIL) 25 MG tablet Take 1 tablet (25 mg total) by mouth 3 (three) times daily as needed for anxiety. 06/08/19   Clapacs, [name], MD  lidocaine (XYLOCAINE) 2 % solution Use as directed 5 mLs in the mouth or throat every 6 (six) hours as needed for mouth pain. Oral swish 06/20/19   Sable Feil, PA-C  lithium carbonate (ESKALITH) 450 MG CR tablet Take 1 tablet (450 mg total) by mouth every 12 (twelve) hours. 06/08/19   Clapacs, [name], MD  Norgestimate-Ethinyl Estradiol Triphasic (ORTHO TRI-CYCLEN, 28,) 0.18/0.215/0.25 MG-35 MCG tablet Take 1 tablet by mouth daily.    [provider]  oxyCODONE-acetaminophen (PERCOCET) 7.5-325 MG tablet Take 1 tablet by mouth every 6 (six) hours as needed for up to 3 days. 06/20/19 06/23/19  Sable Feil, PA-C  traZODone (DESYREL) 100 MG tablet Take 2 tablets (200 mg total) by mouth at bedtime. 06/08/19   Clapacs, [name], MD    Allergies Haldol [haloperidol lactate] and Ultram [tramadol]  Family History  Problem Relation Age of Onset  . Hypertension Mother   . Hyperlipidemia Mother   . Parkinson's disease Father     Social History Social History  Tobacco Use  . Smoking status: Current Every Day Smoker    Packs/day: 1.00    Years: 9.00    Pack years: 9.00  . Smokeless tobacco: Never Used  . Tobacco comment: Patient not interested  Substance Use Topics  . Alcohol use: Not Currently    Alcohol/week: 4.0 standard drinks    Types: 4 Cans of beer per week    Comment: Ocassional  . Drug use: Yes    Types: Marijuana    Comment: percocet -last use over 1 week ago. Heroin-last used 2/11    Review of Systems Constitutional: No fever/chills Eyes: No visual changes. ENT: No sore  throat. Cardiovascular: Denies chest pain. Respiratory: Denies shortness of breath. Gastrointestinal: No abdominal pain.  No nausea, no vomiting.  No diarrhea.  No constipation. Genitourinary: Negative for dysuria. Musculoskeletal: Negative for back pain. Skin: Negative for rash. Neurological: Negative for headaches, focal weakness or numbness. Psychiatric:  Bipolar Allergic/Immunilogical: Haldol and tramadol ____________________________________________   PHYSICAL EXAM:  VITAL SIGNS: ED Triage Vitals  Enc Vitals Group     BP 06/20/19 1642 116/72     Pulse Rate 06/20/19 1642 97     Resp --      Temp 06/20/19 1642 98.5 F (36.9 C)     Temp Source 06/20/19 1642 Oral     SpO2 06/20/19 1642 99 %     Weight 06/20/19 1643 115 lb (52.2 kg)     Height 06/20/19 1643 5\' 2"  (1.575 m)     Head Circumference --      Peak Flow --      Pain Score 06/20/19 1654 8     Pain Loc --      Pain Edu? --      Excl. in [place]? --     Constitutional: Alert and oriented. Well appearing and in no acute distress. Mouth/Throat: Mucous membranes are moist.  Oropharynx non-erythematous.  Extraction #20 with gingival edema. Neck: No stridor.   Hematological/Lymphatic/Immunilogical: No cervical lymphadenopathy. Cardiovascular: Normal rate, regular rhythm. Grossly normal heart sounds.  Good peripheral circulation. Respiratory: Normal respiratory effort.  No retractions. Lungs CTAB. Neurologic:  Normal speech and language. No gross focal neurologic deficits are appreciated. No gait instability. Skin:  Skin is warm, dry and intact. No rash noted. Psychiatric: Mood and affect are normal. Speech and behavior are normal.  ____________________________________________   LABS (all labs ordered are listed, but only abnormal results are displayed)  Labs Reviewed - No data to display ____________________________________________  EKG   ____________________________________________  RADIOLOGY  ED MD  interpretation:    Official radiology report(s): No results found.  ____________________________________________   PROCEDURES  Procedure(s) performed (including Critical Care):  Procedures   ____________________________________________   INITIAL IMPRESSION / ASSESSMENT AND PLAN / ED COURSE  As part of my medical decision making, I reviewed the following data within the electronic MEDICAL RECORD NUMBER         Luwana Butrick was evaluated in Emergency Department on 06/20/2019 for the symptoms described in the history of present illness. She was evaluated in the context of the global COVID-19 pandemic, which necessitated consideration that the patient might be at risk for infection with the SARS-CoV-2 virus that causes COVID-19. Institutional protocols and algorithms that pertain to the evaluation of patients at risk for COVID-19 are in a state of rapid change based on information released by regulatory bodies including the CDC and federal and state organizations. These policies and algorithms were followed during the patient's care in  the ED.  Patient presents with dental pain and edema secondary to a  tooth extraction by her friend.  Patient given discharge care instruction advised to follow-up with dental clinic from list provided on her discharge paperwork.  Take medications as directed.      ____________________________________________   FINAL CLINICAL IMPRESSION(S) / ED DIAGNOSES  Final diagnoses:  Dental abscess     ED Discharge Orders         Ordered    amoxicillin (AMOXIL) 500 MG capsule  3 times daily     06/20/19 1754    oxyCODONE-acetaminophen (PERCOCET) 7.5-325 MG tablet  Every 6 hours PRN     06/20/19 1754    lidocaine (XYLOCAINE) 2 % solution  Every 6 hours PRN     06/20/19 1754           Note:  This document was prepared using Dragon voice recognition software and may include unintentional dictation errors.    Sable Feil, PA-C 06/20/19  1800    Harvest Dark, MD 06/20/19 1954

## 2019-06-20 NOTE — Discharge Instructions (Addendum)
Follow discharge care instruction.  Follow-up with dental clinic from list provided in your discharge care instructions. OPTIONS FOR DENTAL FOLLOW UP CARE  [place] [facility] and [place] [email or web address] [phone])  [facility] [phone])  [place] [phone])  [place] [phone])  [facility] [phone]) This clinic caters to the indigent population and is on a lottery system. Location: [employer] [facility], [employer], [place], [place] [facility] Hours: Wednesdays from 6pm - 9pm, patients seen by a lottery system. For dates, call or go to [email or web address] Services: Cleanings, fillings and simple extractions. Payment Options: DENTAL WORK IS FREE OF CHARGE. Bring proof of income or support. Best way to get seen: Arrive at 5:15 pm - this is a lottery, NOT first come/first serve, so arriving earlier will not increase your chances of being seen.     [facility] [phone] Select option 1 for emergencies   Location: [facility], [place], [address], [place] Clinic Hours: No walk-ins accepted - call the day before to schedule an appointment. Check in times are 9:30 am and 1:30 pm. Services: Simple extractions, temporary fillings, pulpectomy/pulp debridement, uncomplicated abscess drainage. Payment Options: PAYMENT IS DUE AT THE TIME OF SERVICE.  Fee is usually $100-200, additional surgical procedures (e.g. abscess drainage) may be extra. Cash, checks, Visa/MasterCard accepted.  Can file Medicaid if patient is covered for dental - patient should call case worker to check. No discount for [facility] patients. Best way to get seen: MUST call the day before and get onto the schedule.  Can usually be seen the next 1-2 days. No walk-ins accepted.     [place] [phone]   Location: [place], [place] [facility] Hours: M, W, Th, F 8am or 1:30pm, Tues 9a or 1:30 - first come/first served. Services: Simple extractions, temporary fillings, uncomplicated abscess drainage.  You do not need to be an [facility] resident. Payment Options: PAYMENT IS DUE AT THE TIME OF SERVICE. Dental insurance, otherwise sliding scale - bring proof of income or support. Depending on income and treatment needed, cost is usually $50-200. Best way to get seen: Arrive early as it is first come/first served.     [facility] [phone]   Location: [place] [facility] Hours: Mon-Thu 8a-5p Services: Most basic dental services including extractions and fillings. Payment Options: PAYMENT IS DUE AT THE TIME OF SERVICE. Sliding scale, up to 50% off - bring proof if income or support. Medicaid with dental option accepted. Best way to get seen: Call to schedule an appointment, can usually be seen within 2 weeks OR they will try to see walk-ins - show up at [place] or 2p (you may have to wait).     [facility] [place] RESIDENTS ONLY   Location: [facility], [place] [address] [place], [place] [address] Clinic Hours: By appointment only. Monday - Thursday 8am-5pm, Friday 8am-12pm Services: Cleanings, fillings, extractions. Payment Options: PAYMENT IS DUE AT THE TIME OF SERVICE. Cash, Visa or MasterCard. Sliding scale - $30 minimum per service. Best way to get seen: Come in to office, complete packet and make an appointment - need proof of income or support monies for each household member and proof of [facility] residence. Usually takes about a month to get  in.     [facility] [phone]    Location: [place] [place] [facility] Hours: Walk-in Urgent Care Dental Services are offered Monday-Friday mornings only. The numbers of emergencies accepted daily is limited to the number of providers available. Maximum 15 - Mondays, Wednesdays & Thursdays Maximum 10 - Tuesdays & Fridays Services: You do not need to be a [facility] resident to be seen for a dental emergency. Emergencies are defined as pain, swelling, abnormal bleeding, or dental trauma. Walkins will receive x-rays if needed. NOTE: Dental cleaning is not an emergency. Payment Options: PAYMENT IS DUE AT THE TIME OF SERVICE. Minimum co-pay is $40.00 for uninsured patients. Minimum co-pay is $3.00 for Medicaid with dental coverage. Dental Insurance is accepted and must be presented at time of visit. Medicare does not cover dental. Forms of payment: Cash, credit card, checks. Best way to get seen: If not previously registered with the clinic, walk-in dental registration begins at 7:15 am and is on a first come/first serve basis. If previously registered with the clinic, call to make an appointment.     [facility] [place] ONLY   Location: [address] [place], [place] Clinic Hours: Mon-Thu 10a-2p Services: Extractions only! Payment Options: FREE (donations accepted) - bring proof of income or support Best way to get seen: Call and schedule an appointment OR come at 8am on the 1st Monday of every month (except for holidays) when it is first come/first served.     [facility] [phone]   Location: [place], [place] Clinic Hours: Friday mornings Services, Payment Options, Best way to get seen: Call for info

## 2019-06-27 ENCOUNTER — Other Ambulatory Visit: Payer: Self-pay

## 2019-06-27 ENCOUNTER — Emergency Department
Admission: EM | Admit: 2019-06-27 | Discharge: 2019-06-27 | Disposition: A | Discharge status: Home / Self Care | Payer: Self-pay | Attending: Emergency Medicine | Admitting: Emergency Medicine

## 2019-06-27 DIAGNOSIS — F17210 Nicotine dependence, cigarettes, uncomplicated: Secondary | ICD-10-CM | POA: Insufficient documentation

## 2019-06-27 DIAGNOSIS — Z79899 Other long term (current) drug therapy: Secondary | ICD-10-CM | POA: Insufficient documentation

## 2019-06-27 DIAGNOSIS — F200 Paranoid schizophrenia: Secondary | ICD-10-CM

## 2019-06-27 DIAGNOSIS — F209 Schizophrenia, unspecified: Secondary | ICD-10-CM | POA: Insufficient documentation

## 2019-06-27 LAB — URINE DRUG SCREEN, QUALITATIVE (ARMC ONLY)
Amphetamines, Ur Screen: NOT DETECTED
Barbiturates, Ur Screen: NOT DETECTED
Benzodiazepine, Ur Scrn: NOT DETECTED
Cannabinoid 50 Ng, Ur ~~LOC~~: NOT DETECTED
Cocaine Metabolite,Ur ~~LOC~~: NOT DETECTED
MDMA (Ecstasy)Ur Screen: NOT DETECTED
Methadone Scn, Ur: NOT DETECTED
Opiate, Ur Screen: NOT DETECTED
Phencyclidine (PCP) Ur S: NOT DETECTED
Tricyclic, Ur Screen: NOT DETECTED

## 2019-06-27 LAB — CBC
HCT: 41 % (ref 36.0–46.0)
Hemoglobin: 13.7 g/dL (ref 12.0–15.0)
MCH: 31.7 pg (ref 26.0–34.0)
MCHC: 33.4 g/dL (ref 30.0–36.0)
MCV: 94.9 fL (ref 80.0–100.0)
Platelets: 324 10*3/uL (ref 150–400)
RBC: 4.32 MIL/uL (ref 3.87–5.11)
RDW: 12.1 % (ref 11.5–15.5)
WBC: 13.2 10*3/uL — ABNORMAL HIGH (ref 4.0–10.5)
nRBC: 0 % (ref 0.0–0.2)

## 2019-06-27 LAB — COMPREHENSIVE METABOLIC PANEL
ALT: 11 U/L (ref 0–44)
AST: 8 U/L — ABNORMAL LOW (ref 15–41)
Albumin: 4.1 g/dL (ref 3.5–5.0)
Alkaline Phosphatase: 54 U/L (ref 38–126)
Anion gap: 10 (ref 5–15)
BUN: 7 mg/dL (ref 6–20)
CO2: 23 mmol/L (ref 22–32)
Calcium: 9.6 mg/dL (ref 8.9–10.3)
Chloride: 103 mmol/L (ref 98–111)
Creatinine, Ser: 0.59 mg/dL (ref 0.44–1.00)
GFR calc Af Amer: >60 mL/min (ref >60)
GFR calc non Af Amer: >60 mL/min (ref >60)
Glucose, Bld: 121 mg/dL — ABNORMAL HIGH (ref 70–99)
Potassium: 3.7 mmol/L (ref 3.5–5.1)
Sodium: 136 mmol/L (ref 135–145)
Total Bilirubin: 0.4 mg/dL (ref 0.3–1.2)
Total Protein: 7.2 g/dL (ref 6.5–8.1)

## 2019-06-27 LAB — PREGNANCY, URINE: Preg Test, Ur: NEGATIVE

## 2019-06-27 LAB — ACETAMINOPHEN LEVEL: Acetaminophen (Tylenol), Serum: <10 ug/mL — ABNORMAL LOW (ref 10–30)

## 2019-06-27 LAB — ETHANOL: Alcohol, Ethyl (B): <10 mg/dL (ref <10)

## 2019-06-27 LAB — SALICYLATE LEVEL: Salicylate Lvl: <7 mg/dL (ref 2.8–30.0)

## 2019-06-27 NOTE — ED Notes (Signed)
Pt was given phone to call for ride. Says no on can take her. Will get bus pass.

## 2019-06-27 NOTE — ED Notes (Signed)
2 belly button rings, sneakers, grey shirt, purple bra, grey shorts, green underwear and hair clip placed in labeled belonging bag 1 of 1.

## 2019-06-27 NOTE — ED Provider Notes (Signed)
Patient denies suicidal homicidal ideations, has been cleared by psychiatry for discharge.   Earleen Newport, MD 06/27/19 1215

## 2019-06-27 NOTE — ED Provider Notes (Signed)
[facility] Emergency Department Provider Note       Time seen: ----------------------------------------- 9:27 AM on 06/27/2019 -----------------------------------------   I have reviewed the triage vital signs and the nursing notes.  HISTORY   Chief Complaint Other (IVC)    HPI Autumn [name] is a 43 y.o. female with a history of allergies, anemia, bipolar disorder, seizures who presents to the ED for involuntary commitment.  Patient has a history of schizophrenia reports her current medications are not working.  Her boyfriend reported homicidal ideation.  She denies wanting to hurt him at this time.  Past Medical History:  Diagnosis Date  . Allergy    Seasonal, Ultram (seizures)  . Anemia 2005  . Bipolar 1 disorder ([place])   . Glaucoma   . Seizures ([place]) 2008    Patient Active Problem List   Diagnosis Date Noted  . Bipolar I disorder, most recent episode (or current) manic ([place]) 06/02/2019  . Affective psychosis, bipolar ([place]) 05/25/2019  . Bipolar affective disorder, current episode manic ([place]) 05/20/2019  . Opiate abuse, continuous ([place]) 11/21/2018  . Bipolar I disorder, most recent episode depressed, severe without psychotic features ([place]) 07/05/2017  . Tobacco use disorder 06/27/2017  . Amphetamine use disorder, severe ([place]) 06/27/2017  . Opioid use disorder, severe, dependence ([place]) 06/27/2017  . Cannabis use disorder, severe, dependence ([place]) 06/27/2017  . Hx of glaucoma 10/02/2013    Past Surgical History:  Procedure Laterality Date  . BREAST BIOPSY Right    x 2  . CESAREAN SECTION     x 2    Allergies Haldol [haloperidol lactate] and Ultram [tramadol]  Social History Social History   Tobacco Use  . Smoking status: Current Every Day Smoker    Packs/day: 1.00    Years: 9.00    Pack years: 9.00  . Smokeless tobacco: Never Used  . Tobacco comment: Patient not interested  Substance Use Topics  . Alcohol use: Not  Currently    Alcohol/week: 4.0 standard drinks    Types: 4 Cans of beer per week    Comment: Ocassional  . Drug use: Yes    Types: Marijuana    Comment: percocet -last use over 1 week ago. Heroin-last used 2/11   Review of Systems Constitutional: Negative for fever. Cardiovascular: Negative for chest pain. Respiratory: Negative for shortness of breath. Gastrointestinal: Negative for abdominal pain, vomiting and diarrhea. Musculoskeletal: Negative for back pain. Skin: Negative for rash. Neurological: Negative for headaches, focal weakness or numbness. Psychiatric: Negative for suicidal or homicidal ideation at this time  All systems negative/normal/unremarkable except as stated in the HPI  ____________________________________________   PHYSICAL EXAM:  VITAL SIGNS: ED Triage Vitals  Enc Vitals Group     BP 06/27/19 0832 122/81     Pulse Rate 06/27/19 0832 79     Resp 06/27/19 0832 14     Temp 06/27/19 0832 98.9 F (37.2 C)     Temp Source 06/27/19 0832 Oral     SpO2 06/27/19 0832 100 %     Weight 06/27/19 0833 100 lb (45.4 kg)     Height 06/27/19 0833 5\' 2"  (1.575 m)     Head Circumference --      Peak Flow --      Pain Score 06/27/19 0833 2     Pain Loc --      Pain Edu? --      Excl. in [place]? --    Constitutional: Alert and oriented. Well appearing and in  no distress. Eyes: Conjunctivae are normal. Normal extraocular movements. Cardiovascular: Normal rate, regular rhythm. No murmurs, rubs, or gallops. Respiratory: Normal respiratory effort without tachypnea nor retractions. Breath sounds are clear and equal bilaterally. No wheezes/rales/rhonchi. Gastrointestinal: Soft and nontender. Normal bowel sounds Musculoskeletal: Nontender with normal range of motion in extremities. No lower extremity tenderness nor edema. Neurologic:  Normal speech and language. No gross focal neurologic deficits are appreciated.  Skin:  Skin is warm, dry and intact. No rash  noted. Psychiatric: Mood and affect are within normal limits ____________________________________________  ED COURSE:  As part of my medical decision making, I reviewed the following data within the [place] History obtained from family if available, nursing notes, old chart and ekg, as well as notes from prior ED visits. Patient presented for IVC, we will assess with labs as indicated at this time.   Procedures  Brendan Gruwell was evaluated in Emergency Department on 06/27/2019 for the symptoms described in the history of present illness. She was evaluated in the context of the global COVID-19 pandemic, which necessitated consideration that the patient might be at risk for infection with the SARS-CoV-2 virus that causes COVID-19. Institutional protocols and algorithms that pertain to the evaluation of patients at risk for COVID-19 are in a state of rapid change based on information released by regulatory bodies including the CDC and federal and state organizations. These policies and algorithms were followed during the patient's care in the ED.  ____________________________________________   LABS (pertinent positives/negatives)  Labs Reviewed  COMPREHENSIVE METABOLIC PANEL - Abnormal; Notable for the following components:      Result Value   Glucose, Bld 121 (*)    AST 8 (*)    All other components within normal limits  CBC - Abnormal; Notable for the following components:   WBC 13.2 (*)    All other components within normal limits  ETHANOL  URINE DRUG SCREEN, QUALITATIVE ([facility] ONLY)  PREGNANCY, URINE  SALICYLATE LEVEL  ACETAMINOPHEN LEVEL   ____________________________________________   DIFFERENTIAL DIAGNOSIS   Schizophrenia, medication noncompliance, substance abuse  FINAL ASSESSMENT AND PLAN  Schizophrenia   Plan: The patient had presented for involuntary commitment and schizophrenia. Patient's labs revealed leukocytosis of an uncertain etiology.   She is medically clear for psychiatric evaluation and disposition.   Laurence Aly, MD    Note: This note was generated in part or whole with voice recognition software. Voice recognition is usually quite accurate but there are transcription errors that can and very often do occur. I apologize for any typographical errors that were not detected and corrected.     Earleen Newport, MD 06/27/19 [phone]

## 2019-06-27 NOTE — ED Notes (Signed)
Pt says she's here because "my doctor didn't put me on an antipsychotic" for her schizophrenia. Pt she has been compliant with only prescribed med, lithium. She denies SI, HI, and AVH. "I just want something to eat." She is calm/cooperative with disheveled appearance. Pt is now in restroom to supply urine specimen.

## 2019-06-27 NOTE — ED Triage Notes (Signed)
Brought in by BPD under IVC. Hx of schizophrenia and reports current medications are not working.

## 2019-06-27 NOTE — ED Notes (Signed)
Pt discharged per order. Discharge instructions, including AVS, were discussed with pt. Reviewed info about crisis interventions, including suicide hotline and calling 911. Pt voiced understanding and receipt of belongings before she was escorted to the lobby with a bus pass. Pt was ambulatory and in no acute distress.
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
## 2019-06-27 NOTE — ED Notes (Signed)
Patient given a warm blanket and a cup of ginger ale

## 2019-06-29 ENCOUNTER — Ambulatory Visit: Payer: Self-pay | Admitting: Maternal Newborn

## 2019-08-21 ENCOUNTER — Emergency Department
Admission: EM | Admit: 2019-08-21 | Discharge: 2019-08-21 | Disposition: A | Discharge status: Home / Self Care | Payer: Self-pay | Attending: Student in an Organized Health Care Education/Training Program | Admitting: Student in an Organized Health Care Education/Training Program

## 2019-08-21 ENCOUNTER — Other Ambulatory Visit: Payer: Self-pay

## 2019-08-21 ENCOUNTER — Encounter: Payer: Self-pay | Admitting: *Deleted

## 2019-08-21 DIAGNOSIS — Z590 Homelessness: Secondary | ICD-10-CM | POA: Insufficient documentation

## 2019-08-21 DIAGNOSIS — F29 Unspecified psychosis not due to a substance or known physiological condition: Secondary | ICD-10-CM | POA: Insufficient documentation

## 2019-08-21 DIAGNOSIS — F17200 Nicotine dependence, unspecified, uncomplicated: Secondary | ICD-10-CM | POA: Insufficient documentation

## 2019-08-21 DIAGNOSIS — Z79899 Other long term (current) drug therapy: Secondary | ICD-10-CM | POA: Insufficient documentation

## 2019-08-21 LAB — COMPREHENSIVE METABOLIC PANEL
ALT: 16 U/L (ref 0–44)
AST: 12 U/L — ABNORMAL LOW (ref 15–41)
Albumin: 4.8 g/dL (ref 3.5–5.0)
Alkaline Phosphatase: 77 U/L (ref 38–126)
Anion gap: 13 (ref 5–15)
BUN: 10 mg/dL (ref 6–20)
CO2: 21 mmol/L — ABNORMAL LOW (ref 22–32)
Calcium: 9.8 mg/dL (ref 8.9–10.3)
Chloride: 103 mmol/L (ref 98–111)
Creatinine, Ser: 0.6 mg/dL (ref 0.44–1.00)
GFR calc Af Amer: >60 mL/min (ref >60)
GFR calc non Af Amer: >60 mL/min (ref >60)
Glucose, Bld: 102 mg/dL — ABNORMAL HIGH (ref 70–99)
Potassium: 3.4 mmol/L — ABNORMAL LOW (ref 3.5–5.1)
Sodium: 137 mmol/L (ref 135–145)
Total Bilirubin: 1.3 mg/dL — ABNORMAL HIGH (ref 0.3–1.2)
Total Protein: 7.6 g/dL (ref 6.5–8.1)

## 2019-08-21 LAB — VALPROIC ACID LEVEL: Valproic Acid Lvl: <10 ug/mL — ABNORMAL LOW (ref 50.0–100.0)

## 2019-08-21 LAB — CBC
HCT: 45.9 % (ref 36.0–46.0)
Hemoglobin: 15.5 g/dL — ABNORMAL HIGH (ref 12.0–15.0)
MCH: 31.3 pg (ref 26.0–34.0)
MCHC: 33.8 g/dL (ref 30.0–36.0)
MCV: 92.7 fL (ref 80.0–100.0)
Platelets: 278 10*3/uL (ref 150–400)
RBC: 4.95 MIL/uL (ref 3.87–5.11)
RDW: 11.8 % (ref 11.5–15.5)
WBC: 14.5 10*3/uL — ABNORMAL HIGH (ref 4.0–10.5)
nRBC: 0 % (ref 0.0–0.2)

## 2019-08-21 LAB — ETHANOL: Alcohol, Ethyl (B): <10 mg/dL (ref <10)

## 2019-08-21 LAB — LITHIUM LEVEL: Lithium Lvl: <0.06 mmol/L — ABNORMAL LOW (ref 0.60–1.20)

## 2019-08-21 MED ORDER — LORAZEPAM 1 MG PO TABS
1.0000 mg | ORAL_TABLET | Freq: Four times a day (QID) | ORAL | Status: DC | PRN
  Administered 2019-08-21: 1 mg via ORAL
  Filled 2019-08-21: qty 1

## 2019-08-21 MED ORDER — NICOTINE 21 MG/24HR TD PT24
21.0000 mg | MEDICATED_PATCH | Freq: Once | TRANSDERMAL | Status: DC
  Administered 2019-08-21: 21 mg via TRANSDERMAL
  Filled 2019-08-21: qty 1

## 2019-08-21 NOTE — ED Notes (Signed)
Report to include Situation, Background, Assessment, and Recommendations received from Jeannette RN. Patient alert and oriented, warm and dry, in no acute distress. Patient denies SI, HI, AVH and pain. Patient made aware of Q15 minute rounds and Rover and Officer presence for their safety. Patient instructed to come to me with needs or concerns.   

## 2019-08-21 NOTE — ED Triage Notes (Signed)
Pt brought in by [employer].  Pt is IVC  Pt denies SI or HI.  Pt reports using etoh and drugs.  Pt calm.

## 2019-08-21 NOTE — Discharge Instructions (Signed)
Follow up with [facility].

## 2019-08-21 NOTE — ED Notes (Signed)
Patient is stable in NAD. She is discharged to home via cab. VS within Normal limits. Discharge instruction and patient belongings given to patient. No issues.
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
## 2019-08-21 NOTE — ED Notes (Signed)
Patient yelled " I need medication". Patient appears to be anxious and agitated. MD notified.

## 2019-08-21 NOTE — ED Notes (Signed)
Hourly rounding reveals patient in room. No complaints, stable, in no acute distress. Q15 minute rounds and monitoring via Rover and Officer to continue.   

## 2019-08-21 NOTE — ED Notes (Signed)
Patient asked for Nicotine patch. MD notified.

## 2019-08-21 NOTE — ED Provider Notes (Signed)
[facility] Emergency Department Provider Note    First MD Initiated Contact with Patient 08/21/19 1728     (approximate)  I have reviewed the triage vital signs and the nursing notes.   HISTORY  Chief Complaint Behavior Problem  Level V Caveat:  psychosis   HPI Autumn [name] is a 43 y.o. female resents under IVC by [employer] after being found wandering around the street reportedly urinating on herself and trying to eat her hand.  Was also pounding herself in the head.  Patient uncooperative not providing much additional history.  Appears very disorganized and disheveled.    Past Medical History:  Diagnosis Date   Allergy    Seasonal, Ultram (seizures)   Anemia 2005   Bipolar 1 disorder (HCC)    Glaucoma    Seizures ([place]) 2008   Family History  Problem Relation Age of Onset   Hypertension Mother    Hyperlipidemia Mother    Asthma Mother    Parkinson's disease Father    Lung cancer Maternal Grandmother    Other Paternal Grandfather 28       Cardiac arrest   Heart disease Paternal Grandfather    Past Surgical History:  Procedure Laterality Date   BREAST BIOPSY Right    x 2   CESAREAN SECTION     x 2   LEEP  2000   Patient Active Problem List   Diagnosis Date Noted   Bipolar I disorder, most recent episode (or current) manic (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) 06/27/2017   Hx of glaucoma 10/02/2013      Prior to Admission medications   Medication  Sig Start Date End Date Taking? Authorizing Provider  amoxicillin (AMOXIL) 500 MG capsule Take 1 capsule (500 mg total) by mouth 3 (three) times daily. 06/20/19   Sable Feil, PA-C  ARIPiprazole (ABILIFY) 20 MG tablet Take 1 tablet (20 mg total) by mouth daily. 06/08/19   Clapacs, [name], MD  divalproex (DEPAKOTE) 500 MG DR tablet Take 1 tablet (500 mg total) by mouth every 12 (twelve) hours. 06/08/19   Clapacs, [name], MD  hydrOXYzine (ATARAX/VISTARIL) 25 MG tablet Take 1 tablet (25 mg total) by mouth 3 (three) times daily as needed for anxiety. 06/08/19   Clapacs, [name], MD  lidocaine (XYLOCAINE) 2 % solution Use as directed 5 mLs in the mouth or throat every 6 (six) hours as needed for mouth pain. Oral swish 06/20/19   Sable Feil, PA-C  lithium carbonate (ESKALITH) 450 MG CR tablet Take 1 tablet (450 mg total) by mouth every 12 (twelve) hours. 06/08/19   Clapacs, [name], MD  Norgestimate-Ethinyl Estradiol Triphasic (ORTHO TRI-CYCLEN, 28,) 0.18/0.215/0.25 MG-35 MCG tablet Take 1 tablet by mouth daily.    [provider]  traZODone (DESYREL) 100 MG tablet Take 2 tablets (200 mg total) by mouth at bedtime. 06/08/19   Clapacs, [name], MD    Allergies Haldol [haloperidol lactate] and Ultram [tramadol]    Social History Social History   Tobacco Use   Smoking status: Current Every Day Smoker  Packs/day: 1.00    Years: 9.00    Pack years: 9.00   Smokeless tobacco: Never Used   Tobacco comment: Patient not interested  Substance Use Topics   Alcohol use: Yes    Alcohol/week: 4.0 standard drinks    Types: 4 Cans of beer per week    Comment: Ocassional   Drug use: Yes    Types: Marijuana    Comment: percocet -last use over 1 week ago. Heroin-last used 2/11    Review of Systems Patient denies headaches, rhinorrhea, blurry vision, numbness, shortness of breath, chest pain, edema, cough, abdominal pain, nausea, vomiting, diarrhea, dysuria, fevers, rashes or hallucinations  unless otherwise stated above in HPI. ____________________________________________   PHYSICAL EXAM:  VITAL SIGNS: Vitals:   08/21/19 1717  BP: 114/78  Pulse: 98  Resp: 18  Temp: 98.3 F (36.8 C)  SpO2: 98%    Constitutional: Alert , disheveled Eyes: Conjunctivae are normal.  Head: Atraumatic. Nose: No congestion/rhinnorhea. Mouth/Throat: Mucous membranes are moist.   Neck: No stridor. Painless ROM.  Cardiovascular: Normal rate, regular rhythm. Grossly normal heart sounds.  Good peripheral circulation. Respiratory: Normal respiratory effort.  No retractions. Lungs CTAB. Gastrointestinal: Soft and nontender. No distention. No abdominal bruits. No CVA tenderness. Genitourinary:  Musculoskeletal: No lower extremity tenderness nor edema. . Neurologic:  Normal speech and language. No gross focal neurologic deficits are appreciated.  MAE spontaneously, ambulates with steady gait Skin:  Skin is warm, dry and intact. No rash noted. Psychiatric: odd, disorganized  ____________________________________________   LABS (all labs ordered are listed, but only abnormal results are displayed)  Results for orders placed or performed during the hospital encounter of 08/21/19 (from the past 24 hour(s))  Comprehensive metabolic panel     Status: Abnormal   Collection Time: 08/21/19  5:20 PM  Result Value Ref Range   Sodium 137 135 - 145 mmol/L   Potassium 3.4 (L) 3.5 - 5.1 mmol/L   Chloride 103 98 - 111 mmol/L   CO2 21 (L) 22 - 32 mmol/L   Glucose, Bld 102 (H) 70 - 99 mg/dL   BUN 10 6 - 20 mg/dL   Creatinine, Ser 0.60 0.44 - 1.00 mg/dL   Calcium 9.8 8.9 - 10.3 mg/dL   Total Protein 7.6 6.5 - 8.1 g/dL   Albumin 4.8 3.5 - 5.0 g/dL   AST 12 (L) 15 - 41 U/L   ALT 16 0 - 44 U/L   Alkaline Phosphatase 77 38 - 126 U/L   Total Bilirubin 1.3 (H) 0.3 - 1.2 mg/dL   GFR calc non Af Amer >60 >60 mL/min   GFR calc Af Amer >60 >60 mL/min   Anion gap 13 5 - 15  Ethanol     Status: None    Collection Time: 08/21/19  5:20 PM  Result Value Ref Range   Alcohol, Ethyl (B) <10 <10 mg/dL  cbc     Status: Abnormal   Collection Time: 08/21/19  5:20 PM  Result Value Ref Range   WBC 14.5 (H) 4.0 - 10.5 K/uL   RBC 4.95 3.87 - 5.11 MIL/uL   Hemoglobin 15.5 (H) 12.0 - 15.0 g/dL   HCT 45.9 36.0 - 46.0 %   MCV 92.7 80.0 - 100.0 fL   MCH 31.3 26.0 - 34.0 pg   MCHC 33.8 30.0 - 36.0 g/dL   RDW 11.8 11.5 - 15.5 %   Platelets 278 150 - 400 K/uL   nRBC 0.0 0.0 - 0.2 %   ____________________________________________ ____________________________________________  RADIOLOGY   ____________________________________________   PROCEDURES  Procedure(s) performed:  Procedures    Critical Care performed: no ____________________________________________   INITIAL IMPRESSION / ASSESSMENT AND PLAN / ED COURSE  Pertinent labs & imaging results that were available during my care of the patient were reviewed by me and considered in my medical decision making (see chart for details).   DDX: Psychosis, delirium, medication effect, noncompliance, polysubstance abuse, Si, Hi, depression   Niyla Zamor is a 43 y.o. who presents to the ED with for evaluation of odd behavior and self harm.  Patient has psych history of bipolar disorder.  Laboratory testing was ordered to evaluation for underlying electrolyte derangement or signs of underlying organic pathology to explain today's presentation.  Based on history and physical and laboratory evaluation, it appears that the patient's presentation is 2/2 underlying psychiatric disorder and will require further evaluation and management by inpatient psychiatry.  Patient was  made an IVC due to odd behavior and self harm.  Disposition pending psychiatric evaluation.      The patient was evaluated in Emergency Department today for the symptoms described in the history of present illness. He/she was evaluated in the context of the global COVID-19  pandemic, which necessitated consideration that the patient might be at risk for infection with the SARS-CoV-2 virus that causes COVID-19. Institutional protocols and algorithms that pertain to the evaluation of patients at risk for COVID-19 are in a state of rapid change based on information released by regulatory bodies including the CDC and federal and state organizations. These policies and algorithms were followed during the patient's care in the ED.  As part of my medical decision making, I reviewed the following data within the [place] notes reviewed and incorporated, Labs reviewed, notes from prior ED visits and [place] Controlled Substance Database   ____________________________________________   FINAL CLINICAL IMPRESSION(S) / ED DIAGNOSES  Final diagnoses:  Psychosis, unspecified psychosis type ([place])      NEW MEDICATIONS STARTED DURING THIS VISIT:  New Prescriptions   No medications on file     Note:  This document was prepared using Dragon voice recognition software and may include unintentional dictation errors.    Merlyn Lot, MD 08/21/19 1753

## 2019-08-21 NOTE — ED Notes (Signed)
IVC, pend SOC

## 2019-08-21 NOTE — ED Notes (Signed)
Awaiting tele psych consult. Patient requesting medication but unable to state what she takes or use to take at home. Safety maintained will continue to monitor.

## 2019-08-21 NOTE — ED Notes (Signed)
Pt starts hitting her head against the wall and starts smacking her face stating that she is going into a diabetic coma; her arms and face are numb; demanding food before she is discharged. Pt given sandwich tray and ice water; states that we are treating her like an animal because we did not give her a spoon for her applesauce; explained to pt that it is a safety precaution for herself and staff; pt verbalizes understanding.

## 2019-08-21 NOTE — ED Provider Notes (Signed)
The patient has been evaluated at bedside by Dr. Christophe Louis, psychiatry.  Patient is clinically stable.  Not felt to be a danger to self or others.  No SI or Hi.  No indication for inpatient psychiatric admission at this time.  Appropriate for continued outpatient therapy.     Merlyn Lot, MD 08/21/19 [phone]

## 2019-08-29 ENCOUNTER — Emergency Department
Admission: EM | Admit: 2019-08-29 | Discharge: 2019-08-30 | Discharge status: Against Medical Advice | Payer: Medicaid Other

## 2019-08-29 ENCOUNTER — Other Ambulatory Visit: Payer: Self-pay

## 2019-08-29 ENCOUNTER — Emergency Department
Admission: EM | Admit: 2019-08-29 | Discharge: 2019-08-29 | Disposition: A | Discharge status: Home / Self Care | Payer: Medicaid Other | Attending: Emergency Medicine | Admitting: Emergency Medicine

## 2019-08-29 DIAGNOSIS — Z5321 Procedure and treatment not carried out due to patient leaving prior to being seen by health care provider: Secondary | ICD-10-CM | POA: Insufficient documentation

## 2019-08-29 NOTE — ED Notes (Signed)
BPD in room and pt advised she is free to leave. Pt denied SI and HI. Pt ambulatory with steady gait to outside. First RN Step notified.

## 2019-08-29 NOTE — ED Triage Notes (Signed)
Pt states she is needing to be seen for her leg pain. Pt also states she needs to get her diabetes checked. Pt starts yelling hitting her right side of face. Pt asked why she is hitting herself. Pt states bc she needs helps and needs medication.  Pt asked if she would like to talk to a psychiatrist and pt states yes she would.  Pt now sates she just wants to go home and refused to dress out.

## 2019-08-30 ENCOUNTER — Other Ambulatory Visit: Payer: Self-pay

## 2019-08-30 ENCOUNTER — Encounter: Payer: Self-pay | Admitting: Emergency Medicine

## 2019-08-30 ENCOUNTER — Emergency Department
Admission: EM | Admit: 2019-08-30 | Discharge: 2019-08-31 | Disposition: A | Discharge status: Other Acute Inpatient Hospital | Payer: Medicaid Other | Attending: Emergency Medicine | Admitting: Emergency Medicine

## 2019-08-30 DIAGNOSIS — F1220 Cannabis dependence, uncomplicated: Secondary | ICD-10-CM | POA: Diagnosis present

## 2019-08-30 DIAGNOSIS — Z8669 Personal history of other diseases of the nervous system and sense organs: Secondary | ICD-10-CM

## 2019-08-30 DIAGNOSIS — F1510 Other stimulant abuse, uncomplicated: Secondary | ICD-10-CM | POA: Diagnosis present

## 2019-08-30 DIAGNOSIS — Z5900 Homelessness unspecified: Secondary | ICD-10-CM

## 2019-08-30 DIAGNOSIS — F1110 Opioid abuse, uncomplicated: Secondary | ICD-10-CM | POA: Diagnosis present

## 2019-08-30 DIAGNOSIS — F314 Bipolar disorder, current episode depressed, severe, without psychotic features: Secondary | ICD-10-CM | POA: Diagnosis present

## 2019-08-30 DIAGNOSIS — Z046 Encounter for general psychiatric examination, requested by authority: Secondary | ICD-10-CM | POA: Insufficient documentation

## 2019-08-30 DIAGNOSIS — Z20828 Contact with and (suspected) exposure to other viral communicable diseases: Secondary | ICD-10-CM | POA: Insufficient documentation

## 2019-08-30 DIAGNOSIS — R45851 Suicidal ideations: Secondary | ICD-10-CM | POA: Insufficient documentation

## 2019-08-30 DIAGNOSIS — F22 Delusional disorders: Secondary | ICD-10-CM | POA: Insufficient documentation

## 2019-08-30 DIAGNOSIS — F3110 Bipolar disorder, current episode manic without psychotic features, unspecified: Secondary | ICD-10-CM | POA: Diagnosis present

## 2019-08-30 DIAGNOSIS — F17200 Nicotine dependence, unspecified, uncomplicated: Secondary | ICD-10-CM | POA: Insufficient documentation

## 2019-08-30 DIAGNOSIS — F319 Bipolar disorder, unspecified: Secondary | ICD-10-CM | POA: Insufficient documentation

## 2019-08-30 DIAGNOSIS — F1120 Opioid dependence, uncomplicated: Secondary | ICD-10-CM | POA: Diagnosis present

## 2019-08-30 DIAGNOSIS — R456 Violent behavior: Secondary | ICD-10-CM | POA: Insufficient documentation

## 2019-08-30 DIAGNOSIS — Z79899 Other long term (current) drug therapy: Secondary | ICD-10-CM | POA: Insufficient documentation

## 2019-08-30 DIAGNOSIS — F1520 Other stimulant dependence, uncomplicated: Secondary | ICD-10-CM | POA: Diagnosis present

## 2019-08-30 LAB — COMPREHENSIVE METABOLIC PANEL
ALT: 16 U/L (ref 0–44)
AST: 15 U/L (ref 15–41)
Albumin: 4.7 g/dL (ref 3.5–5.0)
Alkaline Phosphatase: 70 U/L (ref 38–126)
Anion gap: 12 (ref 5–15)
BUN: 15 mg/dL (ref 6–20)
CO2: 23 mmol/L (ref 22–32)
Calcium: 9.6 mg/dL (ref 8.9–10.3)
Chloride: 105 mmol/L (ref 98–111)
Creatinine, Ser: 0.87 mg/dL (ref 0.44–1.00)
GFR calc Af Amer: >60 mL/min (ref >60)
GFR calc non Af Amer: >60 mL/min (ref >60)
Glucose, Bld: 132 mg/dL — ABNORMAL HIGH (ref 70–99)
Potassium: 3.3 mmol/L — ABNORMAL LOW (ref 3.5–5.1)
Sodium: 140 mmol/L (ref 135–145)
Total Bilirubin: 1 mg/dL (ref 0.3–1.2)
Total Protein: 7.7 g/dL (ref 6.5–8.1)

## 2019-08-30 LAB — ETHANOL: Alcohol, Ethyl (B): <10 mg/dL (ref <10)

## 2019-08-30 LAB — CBC
HCT: 43.3 % (ref 36.0–46.0)
Hemoglobin: 14.7 g/dL (ref 12.0–15.0)
MCH: 31.5 pg (ref 26.0–34.0)
MCHC: 33.9 g/dL (ref 30.0–36.0)
MCV: 92.7 fL (ref 80.0–100.0)
Platelets: 282 10*3/uL (ref 150–400)
RBC: 4.67 MIL/uL (ref 3.87–5.11)
RDW: 11.7 % (ref 11.5–15.5)
WBC: 14 10*3/uL — ABNORMAL HIGH (ref 4.0–10.5)
nRBC: 0 % (ref 0.0–0.2)

## 2019-08-30 LAB — ACETAMINOPHEN LEVEL: Acetaminophen (Tylenol), Serum: <10 ug/mL — ABNORMAL LOW (ref 10–30)

## 2019-08-30 LAB — POCT PREGNANCY, URINE: Preg Test, Ur: NEGATIVE

## 2019-08-30 LAB — SALICYLATE LEVEL: Salicylate Lvl: <7 mg/dL (ref 2.8–30.0)

## 2019-08-30 LAB — SARS CORONAVIRUS 2 BY RT PCR (HOSPITAL ORDER, PERFORMED IN ~~LOC~~ HOSPITAL LAB): SARS Coronavirus 2: NEGATIVE

## 2019-08-30 MED ORDER — IBUPROFEN 600 MG PO TABS
600.0000 mg | ORAL_TABLET | Freq: Four times a day (QID) | ORAL | Status: DC | PRN
  Administered 2019-08-30: 05:00:00 600 mg via ORAL
  Filled 2019-08-30 (×2): qty 1

## 2019-08-30 NOTE — ED Notes (Signed)
Hourly rounding reveals patient in room. No complaints, stable, in no acute distress. Q15 minute rounds and monitoring via Security Cameras to continue. 

## 2019-08-30 NOTE — ED Notes (Signed)
Pt observed walking out of her room with no clothes on  Pt immediately told to put her clothing on and that she cannot be out of her room naked  She stated  "fuck you - I need to pee"  Pt told to put her clothes on and then come to the BR    Pt states  "i'll just piss in the floor"  I observed her in her room putting her clothes on - clean shirt and underwear provided  She ambulated to the BR

## 2019-08-30 NOTE — BH Assessment (Signed)
Patient is to be admitted to [facility] BMU by Dr. Ainsley Spinner.  Attending Physician will be Dr. Weber Cooks.   Patient has been assigned to room 319, by [facility] Charge Nurse [place]   Intake Paper Work has been signed and placed on patient chart.  ER staff is aware of the admission:  Glenda, ER Secretary    Dr. Quentin Cornwall, ER MD   Elberta Fortis, Patient Access.

## 2019-08-30 NOTE — ED Notes (Signed)
BEHAVIORAL HEALTH ROUNDING Patient sleeping: No. Patient alert and oriented: yes Behavior appropriate: Yes.  ; If no, describe:  Nutrition and fluids offered: yes Toileting and hygiene offered: Yes  Sitter present: q15 minute observations and security camera monitoring   

## 2019-08-30 NOTE — ED Notes (Signed)
IVC'D/PENDING PSYCH CONSULT

## 2019-08-30 NOTE — ED Notes (Signed)
Again encouraged her to allow me to swab her for covid  - explained to her that not getting the test and results will delay her care  She states  "I don't give a fuck - you are not putting that in my nose"

## 2019-08-30 NOTE — ED Triage Notes (Signed)
Patient ambulatory to triage with steady gait, without difficulty or distress noted, brought in by [facility] for IVC; pt denies SI or HI; apparently sleeping outside at [employer], pt is homeless, began ringing people's doorbells

## 2019-08-30 NOTE — ED Notes (Signed)
She has ambulated to the BR to throw her tray in the bag - unknown amount of intake

## 2019-08-30 NOTE — ED Notes (Addendum)
Pt transferred into ED BHU room 4   Patient assigned to appropriate care area. Patient oriented to unit/care area: Informed that, for her safety, care areas are designed for safety and monitored by security cameras at all times; Visiting hours and phone times explained to patient. Patient verbalizes understanding, and verbal contract for safety obtained.  She was seen by NP overnight  Plan of care discussed including referral for inpt placement  She verbalizes  "I don't give a fuck - get my damn food  - open the damn BR - bitch  I am hurting"  Pt vocalized to tech to get her medicine and get it right   While she was int he BR pt was yelling  "I am not going to behave - I am going to fuck something up"  After coming out of the BR she was polite and went to her room  Lights dimmed per her request

## 2019-08-30 NOTE — ED Notes (Signed)
BEHAVIORAL HEALTH ROUNDING Patient sleeping: No. Patient alert and oriented: yes Behavior appropriate: Yes.  - agitated ; If no, describe:  Nutrition and fluids offered: yes Toileting and hygiene offered: Yes  Sitter present: q15 minute observations and security camera monitoring   ENVIRONMENTAL ASSESSMENT Potentially harmful objects out of patient reach: Yes.   Personal belongings secured: Yes.   Patient dressed in hospital provided attire only: Yes.   Plastic bags out of patient reach: Yes.   Patient care equipment (cords, cables, call bells, lines, and drains) shortened, removed, or accounted for: Yes.   Equipment and supplies removed from bottom of stretcher: Yes.   Potentially toxic materials out of patient reach: Yes.   Sharps container removed or out of patient reach: Yes.

## 2019-08-30 NOTE — ED Notes (Signed)
Pt ambulated to room 23, wanded, and states "Im only here for something to eat.  I need a meal tray".  Crackers and a drink provided and pt states she cannot eat crackers.  Sandwich tray provided.

## 2019-08-30 NOTE — ED Notes (Signed)
Pt had walked in and out ED lobby several times stating she wants to be seen then immed saying no she doesn't; pt eventually leaves prior to being triaged

## 2019-08-30 NOTE — ED Notes (Signed)
BEHAVIORAL HEALTH ROUNDING Patient sleeping: Yes.   Patient alert and oriented: eyes closed  Appears asleep Behavior appropriate: Yes.  ; If no, describe:  Nutrition and fluids offered: Yes  Toileting and hygiene offered: sleeping Sitter present: q 15 minute observations and security camera monitoring  

## 2019-08-30 NOTE — ED Notes (Signed)
ED Provider at bedside. 

## 2019-08-30 NOTE — ED Notes (Signed)
Report to include Situation, Background, Assessment, and Recommendations received from Amy RN. Patient alert and oriented, warm and dry, in no acute distress.Patient appears to be in manic state. She was willing to do her COVID test. She was screaming while doing her COVID test. Patient let us do her VS. Patient made aware of Q15 minute rounds and security cameras for their safety. Patient instructed to come to me with needs or concerns.

## 2019-08-30 NOTE — ED Notes (Signed)
Report given to Amy T., RN.

## 2019-08-30 NOTE — ED Notes (Signed)
Dr. Brown at bedside

## 2019-08-30 NOTE — ED Notes (Signed)
NP talking to patient in room. 

## 2019-08-30 NOTE — Consult Note (Signed)
[facility] Consult   Reason for Consult: Mental health problems Referring Physician: Dr. Owens Shark Patient Identification: Autumn [name] MRN:  [id number] Principal Diagnosis: <principal problem not specified> Diagnosis:  Active Problems:   Hx of glaucoma   Tobacco use disorder   Amphetamine use disorder, severe (HCC)   Opioid use disorder, severe, dependence ([place])   Cannabis use disorder, severe, dependence ([place])   Bipolar I disorder, most recent episode depressed, severe without psychotic features ([place])   Opiate abuse, continuous ([place])   Bipolar affective disorder, current episode manic ([place])   Affective psychosis, bipolar ([place])   Bipolar I disorder, most recent episode (or current) manic ([place])   Total Time spent with patient: 30 minutes  Subjective: "I cannot say anything I do not want [name] to get in trouble Autumn [name] is a 43 y.o. female patient presented to [facility] ED via law enforcement under involuntary commitment status (IVC). The patient was brought in this a.m. due to her being homeless, sleeping outside at [employer], and began going to people's homes and ringing their doorbells trying to get into their homes.  This situation puts the patient at risk of getting injured by attempting to break into someone's home.  During her initial assessment, she does not understand her behaviors, the ramifications, the threat, and the danger she poses. The patient was seen face-to-face by this provider; chart reviewed and consulted with Dr. Owens Shark on 08/30/2019 due to the patient's care. It was discussed with the EDP that the patient does meet the criteria to be admitted to the psychiatric inpatient unit.  The patient s alert and oriented x 2, anxious, uncooperative, and mood-congruent with affect on evaluation. The patient is ambiguous during her assessment.  She continues to jump from one topic to the next and cannot provide a realistic answer to these questions  presented to her. The patient does appear to be responding to internal stimuli. She is presenting with delusional thinking. The patient denies auditory or visual hallucinations. The patient denies any suicidal, homicidal, or self-harm ideations. The patient is presenting with some psychotic and paranoia behaviors. During an encounter with the patient, she was unable to answer questions appropriately.  Plan: The patient is a safety risk to self  and does require psychiatric inpatient admission for stabilization and treatment.  HPI: Per Dr. Owens Shark; Autumn [name] is a 44 y.o. female who is homeless with below list of previous medical conditions including bipolar disorder presents to the emergency department secondary to involuntary committed by [employer].  Patient denies any suicidal or homicidal ideation.  Patient denies any auditory visual hallucinations.  Past Psychiatric History:  Bipolar 1 disorder ([place]) Seizures ([place])  Risk to Self:  Yes Risk to Others:  No Prior Inpatient Therapy:  Yes Prior Outpatient Therapy:  Yes  Past Medical History:  Past Medical History:  Diagnosis Date  . Allergy    Seasonal, Ultram (seizures)  . Anemia 2005  . Bipolar 1 disorder ([place])   . Glaucoma   . Seizures ([place]) 2008    Past Surgical History:  Procedure Laterality Date  . BREAST BIOPSY Right    x 2  . CESAREAN SECTION     x 2  . LEEP  2000   Family History:  Family History  Problem Relation Age of Onset  . Hypertension Mother   . Hyperlipidemia Mother   . Asthma Mother   . Parkinson's disease Father   . Lung cancer Maternal Grandmother   .  Other Paternal Grandfather 73       Cardiac arrest  . Heart disease Paternal Grandfather    Family Psychiatric  History:  Social History:  Social History   Substance and Sexual Activity  Alcohol Use Yes  . Alcohol/week: 4.0 standard drinks  . Types: 4 Cans of beer per week   Comment: Ocassional     Social History    Substance and Sexual Activity  Drug Use Yes  . Types: Marijuana   Comment: percocet -last use over 1 week ago. Heroin-last used 2/11    Social History   Socioeconomic History  . Marital status: Single    Spouse name: Not on file  . Number of children: Not on file  . Years of education: Not on file  . Highest education level: Not on file  Occupational History  . Not on file  Social Needs  . Financial resource strain: Not on file  . Food insecurity    Worry: Not on file    Inability: Not on file  . Transportation needs    Medical: Not on file    Non-medical: Not on file  Tobacco Use  . Smoking status: Current Every Day Smoker    Packs/day: 1.00    Years: 9.00    Pack years: 9.00  . Smokeless tobacco: Never Used  . Tobacco comment: Patient not interested  Substance and Sexual Activity  . Alcohol use: Yes    Alcohol/week: 4.0 standard drinks    Types: 4 Cans of beer per week    Comment: Ocassional  . Drug use: Yes    Types: Marijuana    Comment: percocet -last use over 1 week ago. Heroin-last used 2/11  . Sexual activity: Yes    Birth control/protection: None  Lifestyle  . Physical activity    Days per week: Not on file    Minutes per session: Not on file  . Stress: Not on file  Relationships  . Social Herbalist on phone: Not on file    Gets together: Not on file    Attends religious service: Not on file    Active member of club or organization: Not on file    Attends meetings of clubs or organizations: Not on file    Relationship status: Not on file  Other Topics Concern  . Not on file  Social History Narrative  . Not on file   Additional Social History:    Allergies:   Allergies  Allergen Reactions  . Haldol [Haloperidol Lactate]     Patient states that she gets stiff muscles when taking Haldol  . Ultram [Tramadol] Other (See Comments)    Seizures    Labs:  Results for orders placed or performed during the hospital encounter of  08/30/19 (from the past 48 hour(s))  Comprehensive metabolic panel     Status: Abnormal   Collection Time: 08/30/19  3:28 AM  Result Value Ref Range   Sodium 140 135 - 145 mmol/L   Potassium 3.3 (L) 3.5 - 5.1 mmol/L   Chloride 105 98 - 111 mmol/L   CO2 23 22 - 32 mmol/L   Glucose, Bld 132 (H) 70 - 99 mg/dL   BUN 15 6 - 20 mg/dL   Creatinine, Ser 0.87 0.44 - 1.00 mg/dL   Calcium 9.6 8.9 - 10.3 mg/dL   Total Protein 7.7 6.5 - 8.1 g/dL   Albumin 4.7 3.5 - 5.0 g/dL   AST 15 15 - 41 U/L  ALT 16 0 - 44 U/L   Alkaline Phosphatase 70 38 - 126 U/L   Total Bilirubin 1.0 0.3 - 1.2 mg/dL   GFR calc non Af Amer >60 >60 mL/min   GFR calc Af Amer >60 >60 mL/min   Anion gap 12 5 - 15    Comment: Performed at [facility], [place], [address] [place], [place] [address]  Ethanol     Status: None   Collection Time: 08/30/19  3:28 AM  Result Value Ref Range   Alcohol, Ethyl (B) <10 <10 mg/dL    Comment: (NOTE) Lowest detectable limit for serum alcohol is 10 mg/dL. For medical purposes only. Performed at [facility], [address] [place], [place] [id number]   Salicylate level     Status: None   Collection Time: 08/30/19  3:28 AM  Result Value Ref Range   Salicylate Lvl [id number] 2.8 - 30.0 mg/dL    Comment: Performed at [facility], [address] [place], [place] [address]  Acetaminophen level     Status: Abnormal   Collection Time: 08/30/19  3:28 AM  Result Value Ref Range   Acetaminophen (Tylenol), Serum <10 (L) 10 - 30 ug/mL    Comment: (NOTE) Therapeutic concentrations vary significantly. A range of 10-30 ug/mL  may be an effective concentration for many patients. However, some  are best treated at concentrations outside of this range. Acetaminophen concentrations >150 ug/mL at 4 hours after ingestion  and >50 ug/mL at 12 hours after ingestion are often associated with  toxic reactions. Performed at [facility], [address]  [place], [place] [address]   cbc     Status: Abnormal   Collection Time: 08/30/19  3:28 AM  Result Value Ref Range   WBC 14.0 (H) 4.0 - 10.5 K/uL   RBC 4.67 3.87 - 5.11 MIL/uL   Hemoglobin 14.7 12.0 - 15.0 g/dL   HCT 43.3 36.0 - 46.0 %   MCV 92.7 80.0 - 100.0 fL   MCH 31.5 26.0 - 34.0 pg   MCHC 33.9 30.0 - 36.0 g/dL   RDW 11.7 11.5 - 15.5 %   Platelets 282 150 - 400 K/uL   nRBC 0.0 0.0 - 0.2 %    Comment: Performed at [facility], [address] [place], [place] [address]  Pregnancy, urine POC     Status: None   Collection Time: 08/30/19  3:31 AM  Result Value Ref Range   Preg Test, Ur NEGATIVE NEGATIVE    Comment:        THE SENSITIVITY OF THIS METHODOLOGY IS >24 mIU/mL     Current Facility-Administered Medications  Medication Dose Route Frequency Provider Last Rate Last Dose  . ibuprofen (ADVIL) tablet 600 mg  600 mg Oral Q6H PRN Caroline Sauger, NP   600 mg at 08/30/19 [id number]   Current Outpatient Medications  Medication Sig Dispense Refill  . amoxicillin (AMOXIL) 500 MG capsule Take 1 capsule (500 mg total) by mouth 3 (three) times daily. (Patient not taking: Reported on 08/21/2019) 30 capsule 0  . ARIPiprazole (ABILIFY) 20 MG tablet Take 1 tablet (20 mg total) by mouth daily. (Patient not taking: Reported on 08/21/2019) 30 tablet 2  . divalproex (DEPAKOTE) 500 MG DR tablet Take 1 tablet (500 mg total) by mouth every 12 (twelve) hours. (Patient not taking: Reported on 08/21/2019) 60 tablet 2  . hydrOXYzine (ATARAX/VISTARIL) 25 MG tablet Take 1 tablet (25 mg total) by mouth 3 (three) times daily as  needed for anxiety. (Patient not taking: Reported on 08/21/2019) 60 tablet 2  . lidocaine (XYLOCAINE) 2 % solution Use as directed 5 mLs in the mouth or throat every 6 (six) hours as needed for mouth pain. Oral swish 100 mL 0  . lithium carbonate (ESKALITH) 450 MG CR tablet Take 1 tablet (450 mg total) by mouth every 12 (twelve) hours. (Patient not taking: Reported on  08/21/2019) 60 tablet 2  . Norgestimate-Ethinyl Estradiol Triphasic (ORTHO TRI-CYCLEN, 28,) 0.18/0.215/0.25 MG-35 MCG tablet Take 1 tablet by mouth daily.    . traZODone (DESYREL) 100 MG tablet Take 2 tablets (200 mg total) by mouth at bedtime. (Patient not taking: Reported on 08/21/2019) 60 tablet 2    Musculoskeletal: Strength & Muscle Tone: within normal limits Gait & Station: normal Patient leans: N/A  Psychiatric Specialty Exam: Physical Exam  Nursing note and vitals reviewed. Constitutional: She is oriented to person, place, and time. She appears well-developed.  Neck: Normal range of motion. Neck supple.  Cardiovascular: Normal rate.  Respiratory: Effort normal.  Musculoskeletal: Normal range of motion.  Neurological: She is alert and oriented to person, place, and time.    Review of Systems  Psychiatric/Behavioral: Positive for depression, hallucinations and substance abuse. The patient is nervous/anxious.   All other systems reviewed and are negative.   Blood pressure (!) 141/91, pulse 85, temperature 97.6 F (36.4 C), temperature source Oral, resp. rate 18, height 5\' 1"  (1.549 m), weight 49.9 kg, last menstrual period 08/21/2019, SpO2 97 %.Body mass index is 20.78 kg/m.  General Appearance: Disheveled  Eye Contact:  Minimal  Speech:  Garbled and Pressured  Volume:  Increased  Mood:  Anxious and Euphoric  Affect:  Congruent, Inappropriate and Full Range  Thought Process:  Disorganized  Orientation:  Full (Time, Place, and Person)  Thought Content:  Illogical, Delusions, Obsessions and Paranoid Ideation  Suicidal Thoughts:  No  Homicidal Thoughts:  No  Memory:  Immediate;   Poor Recent;   Fair Remote;   Poor  Judgement:  Impaired  Insight:  Lacking  Psychomotor Activity:  Decreased  Concentration:  Concentration: Poor and Attention Span: Poor  Recall:  Poor  Fund of Knowledge:  Poor  Language:  Fair  Akathisia:  Negative  Handed:  Right  AIMS (if indicated):      Assets:  Desire for Improvement Financial Resources/Insurance Social Support  ADL's:  Intact  Cognition:  Impaired,  Mild  Sleep:   Okay     Treatment Plan Summary: Medication management, Plan Patient meets criteria for psychiatric inpatient admission. and Patient meets criteria for psychiatric inpatient admission once a bed becomes available.  Disposition: Recommend psychiatric Inpatient admission when medically cleared. Supportive therapy provided about ongoing stressors.  Caroline Sauger, NP 08/30/2019 6:10 AM

## 2019-08-30 NOTE — ED Provider Notes (Signed)
[facility] Emergency Department Provider Note   First MD Initiated Contact with Patient 08/30/19 [phone]     (approximate)  I have reviewed the triage vital signs and the nursing notes.   HISTORY  Chief Complaint Mental Health Problem    HPI Autumn [name] is a 43 y.o. female who is homeless with below list of previous medical conditions including bipolar disorder presents to the emergency department secondary to involuntary committed by [facility] Department due to paranoid delusions..  Patient denies any suicidal or homicidal ideation.  Patient denies any auditory visual hallucinations.        Past Medical History:  Diagnosis Date  . Allergy    Seasonal, Ultram (seizures)  . Anemia 2005  . Bipolar 1 disorder ([place])   . Glaucoma   . Seizures ([place]) 2008    Patient Active Problem List   Diagnosis Date Noted  . Bipolar I disorder, most recent episode (or current) manic ([place]) 06/02/2019  . Affective psychosis, bipolar ([place]) 05/25/2019  . Bipolar affective disorder, current episode manic ([place]) 05/20/2019  . Opiate abuse, continuous ([place]) 11/21/2018  . Bipolar I disorder, most recent episode depressed, severe without psychotic features ([place]) 07/05/2017  . Tobacco use disorder 06/27/2017  . Amphetamine use disorder, severe ([place]) 06/27/2017  . Opioid use disorder, severe, dependence ([place]) 06/27/2017  . Cannabis use disorder, severe, dependence ([place]) 06/27/2017  . Hx of glaucoma 10/02/2013    Past Surgical History:  Procedure Laterality Date  . BREAST BIOPSY Right    x 2  . CESAREAN SECTION     x 2  . LEEP  2000    Prior to Admission medications   Medication Sig Start Date End Date Taking? Authorizing Provider  amoxicillin (AMOXIL) 500 MG capsule Take 1 capsule (500 mg total) by mouth 3 (three) times daily. Patient not taking: Reported on 08/21/2019 06/20/19   Sable Feil, PA-C  ARIPiprazole (ABILIFY) 20 MG tablet Take 1 tablet  (20 mg total) by mouth daily. Patient not taking: Reported on 08/21/2019 06/08/19   Clapacs, [name], MD  divalproex (DEPAKOTE) 500 MG DR tablet Take 1 tablet (500 mg total) by mouth every 12 (twelve) hours. Patient not taking: Reported on 08/21/2019 06/08/19   Clapacs, [name], MD  hydrOXYzine (ATARAX/VISTARIL) 25 MG tablet Take 1 tablet (25 mg total) by mouth 3 (three) times daily as needed for anxiety. Patient not taking: Reported on 08/21/2019 06/08/19   Clapacs, [name], MD  lidocaine (XYLOCAINE) 2 % solution Use as directed 5 mLs in the mouth or throat every 6 (six) hours as needed for mouth pain. Oral swish 06/20/19   Sable Feil, PA-C  lithium carbonate (ESKALITH) 450 MG CR tablet Take 1 tablet (450 mg total) by mouth every 12 (twelve) hours. Patient not taking: Reported on 08/21/2019 06/08/19   Clapacs, [name], MD  Norgestimate-Ethinyl Estradiol Triphasic (ORTHO TRI-CYCLEN, 28,) 0.18/0.215/0.25 MG-35 MCG tablet Take 1 tablet by mouth daily.    [provider]  traZODone (DESYREL) 100 MG tablet Take 2 tablets (200 mg total) by mouth at bedtime. Patient not taking: Reported on 08/21/2019 06/08/19   Clapacs, [name], MD    Allergies Haldol [haloperidol lactate] and Ultram [tramadol]  Family History  Problem Relation Age of Onset  . Hypertension Mother   . Hyperlipidemia Mother   . Asthma Mother   . Parkinson's disease Father   . Lung cancer Maternal Grandmother   . Other Paternal Grandfather 52  Cardiac arrest  . Heart disease Paternal Grandfather     Social History Social History   Tobacco Use  . Smoking status: Current Every Day Smoker    Packs/day: 1.00    Years: 9.00    Pack years: 9.00  . Smokeless tobacco: Never Used  . Tobacco comment: Patient not interested  Substance Use Topics  . Alcohol use: Yes    Alcohol/week: 4.0 standard drinks    Types: 4 Cans of beer per week    Comment: Ocassional  . Drug use: Yes    Types: Marijuana    Comment: percocet  -last use over 1 week ago. Heroin-last used 2/11    Review of Systems Constitutional: No fever/chills Eyes: No visual changes. ENT: No sore throat. Cardiovascular: Denies chest pain. Respiratory: Denies shortness of breath. Gastrointestinal: No abdominal pain.  No nausea, no vomiting.  No diarrhea.  No constipation. Genitourinary: Negative for dysuria. Musculoskeletal: Negative for neck pain.  Negative for back pain. Integumentary: Negative for rash. Neurological: Negative for headaches, focal weakness or numbness. Psychiatric: Denies suicidal ideation denies homicidal ideation. ____________________________________________   PHYSICAL EXAM:  VITAL SIGNS: ED Triage Vitals [08/30/19 0320]  Enc Vitals Group     BP (!) 141/91     Pulse Rate 85     Resp 18     Temp 97.6 F (36.4 C)     Temp Source Oral     SpO2 99 %     Weight 49.9 kg (110 lb)     Height 1.549 m (5\' 1" )     Head Circumference      Peak Flow      Pain Score      Pain Loc      Pain Edu?      Excl. in [place]?     Constitutional: Alert and oriented.  Eyes: Conjunctivae are normal.  Head: Atraumatic. Mouth/Throat: Patient is wearing a mask. Neck: No stridor.  No meningeal signs.   Cardiovascular: Normal rate, regular rhythm. Good peripheral circulation. Grossly normal heart sounds. Respiratory: Normal respiratory effort.  No retractions. Gastrointestinal: Soft and nontender. No distention.  Musculoskeletal: No lower extremity tenderness nor edema. No gross deformities of extremities. Neurologic:  Normal speech and language. No gross focal neurologic deficits are appreciated.  Skin:  Skin is warm, dry and intact. Psychiatric: Mood and affect are normal. Speech and behavior are normal.  ____________________________________________   LABS (all labs ordered are listed, but only abnormal results are displayed)  Labs Reviewed  COMPREHENSIVE METABOLIC PANEL - Abnormal; Notable for the following components:       Result Value   Potassium 3.3 (*)    Glucose, Bld 132 (*)    All other components within normal limits  ACETAMINOPHEN LEVEL - Abnormal; Notable for the following components:   Acetaminophen (Tylenol), Serum <10 (*)    All other components within normal limits  CBC - Abnormal; Notable for the following components:   WBC 14.0 (*)    All other components within normal limits  ETHANOL  SALICYLATE LEVEL  URINE DRUG SCREEN, QUALITATIVE ([facility] ONLY)  URINALYSIS, COMPLETE ([facility]) WITH MICROSCOPIC  POC URINE PREG, ED  POCT PREGNANCY, URINE     PROCEDURES   Procedure(s) performed (including Critical Care):  Procedures   ____________________________________________   INITIAL IMPRESSION / MDM / [place] / ED COURSE  As part of my medical decision making, I reviewed the following data within the electronic MEDICAL RECORD NUMBER   43 year old female presented with above-stated  history and physical exam secondary to involuntary commitment due to paranoid delusions.  Patient evaluated by psychiatry staff for plan for admission. ____________________________________________  FINAL CLINICAL IMPRESSION(S) / ED DIAGNOSES  Final diagnoses:  Homeless  Bipolar affective disorder, remission status unspecified ([place])     MEDICATIONS GIVEN DURING THIS VISIT:  Medications  ibuprofen (ADVIL) tablet 600 mg (600 mg Oral Given 08/30/19 0515)     ED Discharge Orders    None      *Please note:  Aliz Musgrave was evaluated in Emergency Department on 08/30/2019 for the symptoms described in the history of present illness. She was evaluated in the context of the global COVID-19 pandemic, which necessitated consideration that the patient might be at risk for infection with the SARS-CoV-2 virus that causes COVID-19. Institutional protocols and algorithms that pertain to the evaluation of patients at risk for COVID-19 are in a state of rapid change based on information released by  regulatory bodies including the CDC and federal and state organizations. These policies and algorithms were followed during the patient's care in the ED.  Some ED evaluations and interventions may be delayed as a result of limited staffing during the pandemic.*  Note:  This document was prepared using Dragon voice recognition software and may include unintentional dictation errors.   Gregor Hams, MD 08/30/19 [phone]

## 2019-08-30 NOTE — ED Notes (Signed)
She is pending admission to [facility]  - inpatient orders are in but no bed has been assigned   COVID testing pending  - pt refuses to allow me to swab her  Pt moved to [place] this am with test being done prior to moving

## 2019-08-30 NOTE — ED Notes (Signed)
Patient states police brought her here.  Pt. Is not a good historian.  Pt. States pain all over.  When asked "When did this pain start, pt. Only states "for awhile".  Pt. States she is also here to control her diabetes.  Pt. Asked "what are you taking to control your diabetes?"  Pt. States "I treat it on the streets"  No record of patient being diabetic.  Pt. Requested and was given meal tray and drink.

## 2019-08-30 NOTE — ED Notes (Addendum)
She was requesting medication for pain  - I went out to administer ibuprofen and to get her covid swab   Pt immediately stated  "I am not doing any lab work - no blood and no swab up my nose"  I told her I needed to swab her so that I can help her get an inpatient bed  "I don't give a fuck - I am staying right here anyway - I am not doing that swab"  She went into her room and slammed the door while yelling and cursing   She then declined her ibuprofen for her pain

## 2019-08-30 NOTE — BH Assessment (Signed)
Assessment Note  Autumn [name] is an 43 y.o. female who presents the ER multiple times in one night, with manic like behaviors. Prior to coming back to the ER again, the patient went to a local neighborhood and was knocking on different doors and ringing their doorbells. She was brought back to the ER by law enforcement. While back in the ER she was IVC in order to stop her from leaving.  Per the ER notes and staff, the patient was speaking in word salad. Her thoughts were incoherent and behaviors were belligerent. Patient is well known to the ER for similar presentation. In the past, most of the time she would clear and return to baseline within in twenty-four hours. However, with this ER visit she continued to be disorganized and appeared to be responding to internal stimuli.  Diagnosis: Bipolar  Past Medical History:  Past Medical History:  Diagnosis Date  . Allergy    Seasonal, Ultram (seizures)  . Anemia 2005  . Bipolar 1 disorder ([place])   . Glaucoma   . Seizures ([place]) 2008    Past Surgical History:  Procedure Laterality Date  . BREAST BIOPSY Right    x 2  . CESAREAN SECTION     x 2  . LEEP  2000    Family History:  Family History  Problem Relation Age of Onset  . Hypertension Mother   . Hyperlipidemia Mother   . Asthma Mother   . Parkinson's disease Father   . Lung cancer Maternal Grandmother   . Other Paternal Grandfather 11       Cardiac arrest  . Heart disease Paternal Grandfather     Social History:  reports that she has been smoking. She has a 9.00 pack-year smoking history. She has never used smokeless tobacco. She reports current alcohol use of about 4.0 standard drinks of alcohol per week. She reports current drug use. Drug: Marijuana.  Additional Social History:  Alcohol / Drug Use Pain Medications: See PTA Prescriptions: See PTA Over the Counter: See PTA History of alcohol / drug use?: Yes Longest period of sobriety (when/how long): Unable to  quantify  CIWA: CIWA-Ar BP: (!) 113/52 Pulse Rate: 97 COWS:    Allergies:  Allergies  Allergen Reactions  . Haldol [Haloperidol Lactate]     Patient states that she gets stiff muscles when taking Haldol  . Ultram [Tramadol] Other (See Comments)    Seizures    Home Medications: (Not in a hospital admission)   OB/GYN Status:  Patient's last menstrual period was 08/21/2019 (lmp unknown).  General Assessment Data Location of Assessment: [facility] ED TTS Assessment: In system Is this a Tele or Face-to-Face Assessment?: Face-to-Face Is this an Initial Assessment or a Re-assessment for this encounter?: Initial Assessment Patient Accompanied by:: N/A Language Other than English: No Living Arrangements: Other (Comment)(Private Home) What gender do you identify as?: Female Marital status: Single Pregnancy Status: No Living Arrangements: Alone Can pt return to current living arrangement?: Yes Admission Status: Involuntary Petitioner: ED Attending Is patient capable of signing voluntary admission?: No(Under IVC) Referral Source: Self/Family/Friend Insurance type: None  Medical Screening Exam ([place]) Medical Exam completed: Yes  Crisis Care Plan Living Arrangements: Alone Legal Guardian: Other:(Self) Name of Psychiatrist: Reports of none Name of Therapist: Reports of none  Education Status Is patient currently in school?: No Is the patient employed, unemployed or receiving disability?: Unemployed  Risk to self with the past 6 months Suicidal Ideation: No Has patient been a risk  to self within the past 6 months prior to admission? : No Suicidal Intent: No Has patient had any suicidal intent within the past 6 months prior to admission? : No Is patient at risk for suicide?: No Suicidal Plan?: No Has patient had any suicidal plan within the past 6 months prior to admission? : No Access to Means: No What has been your use of drugs/alcohol within the last 12 months?:  Reports of none Previous Attempts/Gestures: No How many times?: 0 Other Self Harm Risks: Reports of none Triggers for Past Attempts: None known Intentional Self Injurious Behavior: None Family Suicide History: Unknown Recent stressful life event(s): Other (Comment), Conflict (Comment) Persecutory voices/beliefs?: No Depression: No Depression Symptoms: Insomnia, Feeling angry/irritable Substance abuse history and/or treatment for substance abuse?: No Suicide prevention information given to non-admitted patients: Not applicable  Risk to Others within the past 6 months Homicidal Ideation: No Does patient have any lifetime risk of violence toward others beyond the six months prior to admission? : No Thoughts of Harm to Others: No Current Homicidal Intent: No Current Homicidal Plan: No Access to Homicidal Means: No Identified Victim: Reports of none History of harm to others?: No Assessment of Violence: None Noted Violent Behavior Description: Reports of none Does patient have access to weapons?: No Criminal Charges Pending?: No Does patient have a court date: No Is patient on probation?: No  Psychosis Hallucinations: None noted Delusions: None noted  Mental Status Report Appearance/Hygiene: Unremarkable, In scrubs Eye Contact: Good Motor Activity: Freedom of movement, Unremarkable Speech: Logical/coherent, Unremarkable Level of Consciousness: Alert Mood: Anxious, Ambivalent Affect: Appropriate to circumstance, Anxious, Preoccupied Anxiety Level: Moderate Thought Processes: Irrelevant, Coherent Judgement: Partial Orientation: Person, Place, Time, Appropriate for developmental age Obsessive Compulsive Thoughts/Behaviors: Minimal  Cognitive Functioning Concentration: Normal Memory: Recent Impaired, Remote Intact Is patient IDD: No Insight: Fair Impulse Control: Fair Appetite: Good Have you had any weight changes? : No Change Sleep: Decreased Total Hours of Sleep:  5 Vegetative Symptoms: None  ADLScreening [facility] Assessment Services) Patient's cognitive ability adequate to safely complete daily activities?: Yes Patient able to express need for assistance with ADLs?: Yes Independently performs ADLs?: Yes (appropriate for developmental age)  Prior Inpatient Therapy Prior Inpatient Therapy: Yes Prior Therapy Dates: 05/2019, 11/2018, 04/2018, 08/2017, 06/2017, 01/2014, 07/2013, 06/2012, 07/2010 & 07/2009 Prior Therapy Facilty/Provider(s): [facility] Reason for Treatment: Bipolar/Schizophrenia  Prior Outpatient Therapy Prior Outpatient Therapy: No Does patient have an ACCT team?: No Does patient have Intensive In-House Services?  : No Does patient have [facility] services? : No Does patient have P4CC services?: No  ADL Screening (condition at time of admission) Patient's cognitive ability adequate to safely complete daily activities?: Yes Is the patient deaf or have difficulty hearing?: No Does the patient have difficulty seeing, even when wearing glasses/contacts?: No Does the patient have difficulty concentrating, remembering, or making decisions?: No Patient able to express need for assistance with ADLs?: Yes Does the patient have difficulty dressing or bathing?: No Independently performs ADLs?: Yes (appropriate for developmental age) Does the patient have difficulty walking or climbing stairs?: No Weakness of Legs: None Weakness of Arms/Hands: None  Home Assistive Devices/Equipment Home Assistive Devices/Equipment: None  Therapy Consults (therapy consults require a physician order) PT Evaluation Needed: No OT Evalulation Needed: No SLP Evaluation Needed: No Abuse/Neglect Assessment (Assessment to be complete while patient is alone) Abuse/Neglect Assessment Can Be Completed: Yes Physical Abuse: Denies Verbal Abuse: Denies Sexual Abuse: Denies Exploitation of patient/patient's resources: Denies Self-Neglect: Denies Values /  Beliefs  Cultural Requests During Hospitalization: None Spiritual Requests During Hospitalization: None Consults Spiritual Care Consult Needed: No Social Work Consult Needed: No Regulatory affairs officer (For Healthcare) Does Patient Have a Medical Advance Directive?: No Would patient like information on creating a medical advance directive?: No - Patient declined       Child/Adolescent Assessment Running Away Risk: Denies(Patient is an adult)  Disposition:  Disposition Initial Assessment Completed for this Encounter: Yes  On Site Evaluation by:   Reviewed with Physician:    Gunnar Fusi MS, LCAS, [facility], [place] Therapeutic Triage Specialist 08/30/2019 2:13 PM

## 2019-08-31 ENCOUNTER — Inpatient Hospital Stay
Admission: AD | Admit: 2019-08-31 | Discharge: 2019-09-10 | DRG: 885 | Disposition: A | Discharge status: Home / Self Care | Payer: No Typology Code available for payment source | Source: Intra-hospital | Attending: Psychiatry | Admitting: Psychiatry

## 2019-08-31 DIAGNOSIS — F29 Unspecified psychosis not due to a substance or known physiological condition: Secondary | ICD-10-CM | POA: Diagnosis present

## 2019-08-31 DIAGNOSIS — Z590 Homelessness: Secondary | ICD-10-CM

## 2019-08-31 DIAGNOSIS — Z9114 Patient's other noncompliance with medication regimen: Secondary | ICD-10-CM | POA: Diagnosis not present

## 2019-08-31 DIAGNOSIS — G4700 Insomnia, unspecified: Secondary | ICD-10-CM | POA: Diagnosis present

## 2019-08-31 DIAGNOSIS — F1010 Alcohol abuse, uncomplicated: Secondary | ICD-10-CM

## 2019-08-31 DIAGNOSIS — F17210 Nicotine dependence, cigarettes, uncomplicated: Secondary | ICD-10-CM | POA: Diagnosis present

## 2019-08-31 DIAGNOSIS — F3112 Bipolar disorder, current episode manic without psychotic features, moderate: Secondary | ICD-10-CM | POA: Diagnosis not present

## 2019-08-31 DIAGNOSIS — Z7289 Other problems related to lifestyle: Secondary | ICD-10-CM

## 2019-08-31 LAB — VALPROIC ACID LEVEL: Valproic Acid Lvl: 62 ug/mL (ref 50.0–100.0)

## 2019-08-31 LAB — LITHIUM LEVEL: Lithium Lvl: 0.45 mmol/L — ABNORMAL LOW (ref 0.60–1.20)

## 2019-08-31 MED ORDER — MAGNESIUM HYDROXIDE 400 MG/5ML PO SUSP
30.0000 mL | Freq: Every day | ORAL | Status: DC | PRN
  Administered 2019-09-03 – 2019-09-10 (×4): 30 mL via ORAL
  Filled 2019-08-31 (×4): qty 30

## 2019-08-31 MED ORDER — NORGESTIM-ETH ESTRAD TRIPHASIC 0.18/0.215/0.25 MG-35 MCG PO TABS: 1.0000 | ORAL_TABLET | Freq: Every day | ORAL | Status: DC

## 2019-08-31 MED ORDER — IBUPROFEN 600 MG PO TABS
600.0000 mg | ORAL_TABLET | Freq: Four times a day (QID) | ORAL | Status: DC | PRN
  Administered 2019-09-07 – 2019-09-09 (×5): 600 mg via ORAL
  Filled 2019-08-31 (×5): qty 1

## 2019-08-31 MED ORDER — ARIPIPRAZOLE 10 MG PO TABS
20.0000 mg | ORAL_TABLET | Freq: Every day | ORAL | Status: DC
  Administered 2019-08-31 – 2019-09-10 (×11): 20 mg via ORAL
  Filled 2019-08-31 (×12): qty 2

## 2019-08-31 MED ORDER — ACETAMINOPHEN 325 MG PO TABS
650.0000 mg | ORAL_TABLET | Freq: Four times a day (QID) | ORAL | Status: DC | PRN
  Administered 2019-08-31 – 2019-09-10 (×6): 650 mg via ORAL
  Filled 2019-08-31 (×6): qty 2

## 2019-08-31 MED ORDER — HYDROXYZINE HCL 25 MG PO TABS
25.0000 mg | ORAL_TABLET | Freq: Three times a day (TID) | ORAL | Status: DC | PRN
  Administered 2019-08-31 – 2019-09-09 (×5): 25 mg via ORAL
  Filled 2019-08-31 (×6): qty 1

## 2019-08-31 MED ORDER — DIVALPROEX SODIUM 500 MG PO DR TAB
500.0000 mg | DELAYED_RELEASE_TABLET | Freq: Two times a day (BID) | ORAL | Status: DC
  Administered 2019-08-31 – 2019-09-10 (×19): 500 mg via ORAL
  Filled 2019-08-31 (×22): qty 1

## 2019-08-31 MED ORDER — NICOTINE 21 MG/24HR TD PT24
21.0000 mg | MEDICATED_PATCH | Freq: Every day | TRANSDERMAL | Status: DC
  Administered 2019-08-31 – 2019-09-10 (×11): 21 mg via TRANSDERMAL
  Filled 2019-08-31 (×11): qty 1

## 2019-08-31 MED ORDER — LITHIUM CARBONATE ER 450 MG PO TBCR
450.0000 mg | EXTENDED_RELEASE_TABLET | Freq: Two times a day (BID) | ORAL | Status: DC
  Administered 2019-08-31 – 2019-09-10 (×19): 450 mg via ORAL
  Filled 2019-08-31 (×22): qty 1

## 2019-08-31 MED ORDER — ALUM & MAG HYDROXIDE-SIMETH 200-200-20 MG/5ML PO SUSP
30.0000 mL | ORAL | Status: DC | PRN
  Administered 2019-09-09: 23:00:00 30 mL via ORAL
  Filled 2019-08-31: qty 30

## 2019-08-31 NOTE — Plan of Care (Signed)
Patient newly admitted, hasn't had time to progress.   Problem: Education: Goal: Knowledge of  General Education information/materials will improve Outcome: Not Progressing Goal: Emotional status will improve Outcome: Not Progressing Goal: Mental status will improve Outcome: Not Progressing Goal: Verbalization of understanding the information provided will improve Outcome: Not Progressing   Problem: Safety: Goal: Periods of time without injury will increase Outcome: Not Progressing   Problem: Education: Goal: Will be free of psychotic symptoms Outcome: Not Progressing Goal: Knowledge of the prescribed therapeutic regimen will improve Outcome: Not Progressing   Problem: Safety: Goal: Ability to redirect hostility and anger into socially appropriate behaviors will improve Outcome: Not Progressing Goal: Ability to remain free from injury will improve Outcome: Not Progressing   

## 2019-08-31 NOTE — Plan of Care (Signed)
Patient stayed in bed most of the shift.Patient states "I am scared because they can hear what I am thinking."Patient talks disorganized and does not make much sense.Denies SI,HI and AVH at this time.Poor appetite for breakfast and lunch.Had dinner with good appetite.Compliant with medications.Support and encouragement given.

## 2019-08-31 NOTE — ED Notes (Signed)
Patient is stable in NAD. She is transferred to [facility] via NT and officer. Patient belongings and IVC paper given to the unit. Report given to [facility] RN. No issues.

## 2019-08-31 NOTE — Progress Notes (Signed)
Recreation Therapy Notes  INPATIENT RECREATION THERAPY ASSESSMENT  Patient Details Name: Quiera Barberio MRN: [id number] DOB: May 27, 1976 Today's Date: 08/31/2019       Information Obtained From: Patient(Patient refused)  Able to Participate in Assessment/Interview:    Patient Presentation:    Reason for Admission (Per Patient):    Patient Stressors:    Coping Skills:      Leisure Interests (2+):     Frequency of Recreation/Participation:    Awareness of Community Resources:     [employer]:     Current Use:    If no, Barriers?:    Expressed Interest in [place] of Residence:     Patient Main Form of Transportation:    Patient Strengths:     Patient Identified Areas of Improvement:     Patient Goal for Hospitalization:     Current SI (including self-harm):     Current HI:     Current AVH:    Staff Intervention Plan:    Consent to Intern Participation:    Cletis Muma 08/31/2019, 4:04 PM

## 2019-08-31 NOTE — ED Notes (Signed)
Hourly rounding reveals patient in room. No complaints, stable, in no acute distress. Q15 minute rounds and monitoring via Security Cameras to continue. 

## 2019-08-31 NOTE — H&P (Signed)
Psychiatric Admission Assessment Adult  Patient Identification: Kassey Khaled MRN:  [id number] Date of Evaluation:  08/31/2019 Chief Complaint:  schizophrenia Principal Diagnosis: Bipolar 1 disorder, manic, moderate ([place]) Diagnosis:  Principal Problem:   Bipolar 1 disorder, manic, moderate ([place]) Active Problems:   Alcohol abuse  History of Present Illness: Patient seen and chart reviewed.  Patient well-known to our service with chronic psychotic symptoms chronic noncompliance with treatment.  She evidently was picked up by police after they were called because she was ringing people's doorbells in the middle of the night and acting bizarrely.  Patient says that she was engaging in "self injurious behavior".  I asked her to tell me what that was and she said it was walking around without any shoes on.  She still seems confused and disorganized.  She tells me that she has been drinking but cannot tell me anything about how much and there is no real evidence that she has been drinking.  She definitely has not been taking any of her psychiatric medicines appropriately recently and cannot remember where she has been living. Associated Signs/Symptoms: Depression Symptoms:  depressed mood, psychomotor retardation, difficulty concentrating, hopelessness, impaired memory, (Hypo) Manic Symptoms:  Distractibility, Anxiety Symptoms:  Excessive Worry, Psychotic Symptoms:  Hallucinations: Auditory Paranoia, PTSD Symptoms: Negative Total Time spent with patient: 1 hour  Past Psychiatric History: Patient has a history of bipolar disorder with frequent presentations to the hospital psychotic sometimes agitated sometimes depressed.  Has made suicidal threats in the past.  Has had substance abuse problems occasionally in the past but most recently seems to have not been having much substance abuse issue included.  Frequently noncompliant with medication.  Last time she was here she was supposedly  living with her boyfriend and I think that is where she was going back.  She tells me that person is no longer in her life.  She is a extremely poor historian at this point.  Is the patient at risk to self? Yes.    Has the patient been a risk to self in the past 6 months? Yes.    Has the patient been a risk to self within the distant past? Yes.    Is the patient a risk to others? No.  Has the patient been a risk to others in the past 6 months? No.  Has the patient been a risk to others within the distant past? No.   Prior Inpatient Therapy:   Prior Outpatient Therapy:    Alcohol Screening: 1. How often do you have a drink containing alcohol?: Never 2. How many drinks containing alcohol do you have on a typical day when you are drinking?: 1 or 2 3. How often do you have six or more drinks on one occasion?: Never AUDIT-C Score: 0 4. How often during the last year have you found that you were not able to stop drinking once you had started?: Never 5. How often during the last year have you failed to do what was normally expected from you becasue of drinking?: Never 6. How often during the last year have you needed a first drink in the morning to get yourself going after a heavy drinking session?: Never 7. How often during the last year have you had a feeling of guilt of remorse after drinking?: Never 8. How often during the last year have you been unable to remember what happened the night before because you had been drinking?: Never 9. Have you or someone else been injured  as a result of your drinking?: No 10. Has a relative or friend or a doctor or another health worker been concerned about your drinking or suggested you cut down?: No Alcohol Use Disorder Identification Test Final Score (AUDIT): 0 Alcohol Brief Interventions/Follow-up: AUDIT Score <7 follow-up not indicated Substance Abuse History in the last 12 months:  Yes.   Consequences of Substance Abuse: Negative Previous Psychotropic  Medications: No  Psychological Evaluations: Yes  Past Medical History:  Past Medical History:  Diagnosis Date  . Allergy    Seasonal, Ultram (seizures)  . Anemia 2005  . Bipolar 1 disorder ([place])   . Glaucoma   . Seizures ([place]) 2008    Past Surgical History:  Procedure Laterality Date  . BREAST BIOPSY Right    x 2  . CESAREAN SECTION     x 2  . LEEP  2000   Family History:  Family History  Problem Relation Age of Onset  . Hypertension Mother   . Hyperlipidemia Mother   . Asthma Mother   . Parkinson's disease Father   . Lung cancer Maternal Grandmother   . Other Paternal Grandfather 22       Cardiac arrest  . Heart disease Paternal Grandfather    Family Psychiatric  History: None known Tobacco Screening: Have you used any form of tobacco in the last 30 days? (Cigarettes, Smokeless Tobacco, Cigars, and/or Pipes): Yes Tobacco use, Select all that apply: 5 or more cigarettes per day Are you interested in Tobacco Cessation Medications?: No, patient refused Counseled patient on smoking cessation including recognizing danger situations, developing coping skills and basic information about quitting provided: Refused/Declined practical counseling Social History:  Social History   Substance and Sexual Activity  Alcohol Use Yes  . Alcohol/week: 4.0 standard drinks  . Types: 4 Cans of beer per week   Comment: Ocassional     Social History   Substance and Sexual Activity  Drug Use Yes  . Types: Marijuana   Comment: percocet -last use over 1 week ago. Heroin-last used 2/11    Additional Social History:                           Allergies:   Allergies  Allergen Reactions  . Haldol [Haloperidol Lactate]     Patient states that she gets stiff muscles when taking Haldol  . Ultram [Tramadol] Other (See Comments)    Seizures   Lab Results:  Results for orders placed or performed during the hospital encounter of 08/31/19 (from the past 48 hour(s))  Lithium level      Status: Abnormal   Collection Time: 08/31/19  4:05 PM  Result Value Ref Range   Lithium Lvl 0.45 (L) 0.60 - 1.20 mmol/L    Comment: Performed at [facility], [address] [place], [place] [address]  Valproic acid level     Status: None   Collection Time: 08/31/19  4:05 PM  Result Value Ref Range   Valproic Acid Lvl 62 50.0 - 100.0 ug/mL    Comment: Performed at [facility], [address] [place], [place] [address]    Blood Alcohol level:  Lab Results  Component Value Date   [facility] <10 08/30/2019   ETH <10 0000000    Metabolic Disorder Labs:  Lab Results  Component Value Date   HGBA1C 5.0 11/21/2018   MPG 96.8 11/21/2018   MPG 93.93 04/13/2018   No results found for: PROLACTIN Lab Results  Component Value Date   CHOL 196 11/21/2018   TRIG 97 11/21/2018   HDL 53 11/21/2018   CHOLHDL 3.7 11/21/2018   VLDL 19 11/21/2018   LDLCALC 124 (H) 11/21/2018   LDLCALC 117 (H) 04/13/2018    Current Medications: Current Facility-Administered Medications  Medication Dose Route Frequency Provider Last Rate Last Dose  . acetaminophen (TYLENOL) tablet 650 mg  650 mg Oral Q6H PRN Caroline Sauger, NP   650 mg at 08/31/19 1558  . alum & mag hydroxide-simeth (MAALOX/MYLANTA) 200-200-20 MG/5ML suspension 30 mL  30 mL Oral Q4H PRN Caroline Sauger, NP      . ARIPiprazole (ABILIFY) tablet 20 mg  20 mg Oral Daily Caroline Sauger, NP   20 mg at 08/31/19 0754  . divalproex (DEPAKOTE) DR tablet 500 mg  500 mg Oral Q12H Caroline Sauger, NP   500 mg at 08/31/19 0754  . hydrOXYzine (ATARAX/VISTARIL) tablet 25 mg  25 mg Oral TID PRN Caroline Sauger, NP   25 mg at 08/31/19 0808  . ibuprofen (ADVIL) tablet 600 mg  600 mg Oral Q6H PRN Caroline Sauger, NP      . lithium carbonate (ESKALITH) CR tablet 450 mg  450 mg Oral Q12H Caroline Sauger, NP   450 mg at 08/31/19 0754  . magnesium hydroxide (MILK OF MAGNESIA) suspension 30 mL  30 mL Oral  Daily PRN Caroline Sauger, NP      . nicotine (NICODERM CQ - dosed in mg/24 hours) patch 21 mg  21 mg Transdermal Daily Novak Stgermaine, [name], MD   21 mg at 08/31/19 0811  . Norgestimate-Ethinyl Estradiol Triphasic 0.18/0.215/0.25 MG-35 MCG tablet 1 tablet  1 tablet Oral Daily Caroline Sauger, NP       PTA Medications: Medications Prior to Admission  Medication Sig Dispense Refill Last Dose  . amoxicillin (AMOXIL) 500 MG capsule Take 1 capsule (500 mg total) by mouth 3 (three) times daily. (Patient not taking: Reported on 08/21/2019) 30 capsule 0   . ARIPiprazole (ABILIFY) 20 MG tablet Take 1 tablet (20 mg total) by mouth daily. (Patient not taking: Reported on 08/21/2019) 30 tablet 2   . divalproex (DEPAKOTE) 500 MG DR tablet Take 1 tablet (500 mg total) by mouth every 12 (twelve) hours. (Patient not taking: Reported on 08/21/2019) 60 tablet 2   . hydrOXYzine (ATARAX/VISTARIL) 25 MG tablet Take 1 tablet (25 mg total) by mouth 3 (three) times daily as needed for anxiety. (Patient not taking: Reported on 08/21/2019) 60 tablet 2   . lidocaine (XYLOCAINE) 2 % solution Use as directed 5 mLs in the mouth or throat every 6 (six) hours as needed for mouth pain. Oral swish (Patient not taking: Reported on 08/30/2019) 100 mL 0   . lithium carbonate (ESKALITH) 450 MG CR tablet Take 1 tablet (450 mg total) by mouth every 12 (twelve) hours. (Patient not taking: Reported on 08/21/2019) 60 tablet 2   . Norgestimate-Ethinyl Estradiol Triphasic (ORTHO TRI-CYCLEN, 28,) 0.18/0.215/0.25 MG-35 MCG tablet Take 1 tablet by mouth daily.     . traZODone (DESYREL) 100 MG tablet Take 2 tablets (200 mg total) by mouth at bedtime. (Patient not taking: Reported on 08/21/2019) 60 tablet 2     Musculoskeletal: Strength & Muscle Tone: within normal limits Gait & Station: normal Patient leans: N/A  Psychiatric Specialty Exam: Physical Exam  Nursing note and vitals reviewed. Constitutional: She appears well-developed and  well-nourished.  HENT:  Head: Normocephalic and atraumatic.  Eyes: Pupils are equal, round, and reactive to light. Conjunctivae are  normal.  Neck: Normal range of motion.  Cardiovascular: Regular rhythm and normal heart sounds.  Respiratory: Effort normal. No respiratory distress.  GI: Soft.  Musculoskeletal: Normal range of motion.  Neurological: She is alert.  Skin: Skin is warm and dry.  Psychiatric: Her affect is blunt. Her speech is delayed. She is slowed and withdrawn. Thought content is paranoid. Cognition and memory are impaired. She expresses impulsivity. She expresses no suicidal ideation.    Review of Systems  Constitutional: Negative.   HENT: Negative.   Eyes: Negative.   Respiratory: Negative.   Cardiovascular: Negative.   Gastrointestinal: Negative.   Musculoskeletal: Negative.   Skin: Negative.   Neurological: Negative.   Psychiatric/Behavioral: Positive for depression, hallucinations and substance abuse. Negative for suicidal ideas. The patient is nervous/anxious and has insomnia.     Blood pressure 118/84, pulse 80, temperature 97.8 F (36.6 C), temperature source Oral, resp. rate 16, height 5\' 1"  (1.549 m), weight 47.6 kg, last menstrual period 08/21/2019, SpO2 100 %.Body mass index is 19.84 kg/m.  General Appearance: Disheveled  Eye Contact:  Fair  Speech:  Slow  Volume:  Decreased  Mood:  Dysphoric  Affect:  Flat  Thought Process:  Disorganized  Orientation:  Full (Time, Place, and Person)  Thought Content:  Illogical  Suicidal Thoughts:  No  Homicidal Thoughts:  No  Memory:  Immediate;   Fair Recent;   Poor Remote;   Poor  Judgement:  Impaired  Insight:  Shallow  Psychomotor Activity:  Decreased  Concentration:  Concentration: Poor  Recall:  Poor  Fund of Knowledge:  Fair  Language:  Fair  Akathisia:  No  Handed:  Right  AIMS (if indicated):     Assets:  Desire for Improvement  ADL's:  Impaired  Cognition:  Impaired,  Mild  Sleep:  Number of  Hours: 4    Treatment Plan Summary: Plan Restart psychiatric medicine lithium and Depakote and antipsychotic.  Patient agreeable.  Monitor for behavior changes.  Once she is a little more clear in her thought we can start working on possible discharge options as she currently seems to be homeless.  Observation Level/Precautions:  15 minute checks  Laboratory:  Chemistry Profile  Psychotherapy:    Medications:    Consultations:    Discharge Concerns:    Estimated LOS:  Other:     Physician Treatment Plan for Primary Diagnosis: Bipolar 1 disorder, manic, moderate ([place]) Long Term Goal(s): Improvement in symptoms so as ready for discharge  Short Term Goals: Ability to verbalize feelings will improve and Ability to demonstrate self-control will improve  Physician Treatment Plan for Secondary Diagnosis: Principal Problem:   Bipolar 1 disorder, manic, moderate ([facility]) Active Problems:   Alcohol abuse  Long Term Goal(s): Improvement in symptoms so as ready for discharge  Short Term Goals: Compliance with prescribed medications will improve  I certify that inpatient services furnished can reasonably be expected to improve the patient's condition.    Alethia Berthold, MD 10/23/20207:01 PM

## 2019-08-31 NOTE — Progress Notes (Signed)
Recreation Therapy Notes    Date: 08/31/2019  Time: 9:30 am   Location: Craft room   Behavioral response: N/A   Intervention Topic: Leisure  Discussion/Intervention: Patient did not attend group.   Clinical Observations/Feedback:  Patient did not attend group.   Kasumi Ditullio LRT/CTRS          Ilissa Rosner 08/31/2019 12:04 PM

## 2019-08-31 NOTE — BHH Suicide Risk Assessment (Signed)
[facility] Admission Suicide Risk Assessment   Nursing information obtained from:  Patient Demographic factors:  Caucasian, Low socioeconomic status, Living alone Current Mental Status:  NA Loss Factors:  NA Historical Factors:  NA Risk Reduction Factors:  NA  Total Time spent with patient: 1 hour Principal Problem: Bipolar 1 disorder, manic, moderate (HCC) Diagnosis:  Principal Problem:   Bipolar 1 disorder, manic, moderate (HCC) Active Problems:   Alcohol abuse  Subjective Data: Patient seen and chart reviewed.  Patient well-known to the service multiple hospitalizations.  Brought in after being presented to law enforcement wringing people's doorbells acting bizarre showing psychotic symptoms.  Patient is denying any suicidal thought or plan.  Denies any homicidal thought.  She is calm and agreeable to treatment  Continued Clinical Symptoms:  Alcohol Use Disorder Identification Test Final Score (AUDIT): 0 The "Alcohol Use Disorders Identification Test", Guidelines for Use in Primary Care, Second Edition.  World Pharmacologist [facility]). Score between 0-7:  no or low risk or alcohol related problems. Score between 8-15:  moderate risk of alcohol related problems. Score between 16-19:  high risk of alcohol related problems. Score 20 or above:  warrants further diagnostic evaluation for alcohol dependence and treatment.   CLINICAL FACTORS:   Bipolar Disorder:   Mixed State   Musculoskeletal: Strength & Muscle Tone: within normal limits Gait & Station: normal Patient leans: N/A  Psychiatric Specialty Exam: Physical Exam  Nursing note and vitals reviewed. Constitutional: She appears well-developed and well-nourished.  HENT:  Head: Normocephalic and atraumatic.  Eyes: Pupils are equal, round, and reactive to light. Conjunctivae are normal.  Neck: Normal range of motion.  Cardiovascular: Regular rhythm and normal heart sounds.  Respiratory: Effort normal. No respiratory distress.   GI: Soft.  Musculoskeletal: Normal range of motion.  Neurological: She is alert.  Skin: Skin is warm and dry.  Psychiatric: Her affect is blunt. Her speech is delayed. She is slowed. Thought content is paranoid. Cognition and memory are impaired. She expresses impulsivity. She expresses no suicidal ideation.    Review of Systems  Constitutional: Negative.   HENT: Negative.   Eyes: Negative.   Respiratory: Negative.   Cardiovascular: Negative.   Gastrointestinal: Negative.   Musculoskeletal: Negative.   Skin: Negative.   Neurological: Negative.   Psychiatric/Behavioral: Positive for hallucinations and memory loss. Negative for depression, substance abuse and suicidal ideas. The patient is nervous/anxious and has insomnia.     Blood pressure 118/84, pulse 80, temperature 97.8 F (36.6 C), temperature source Oral, resp. rate 16, height 5\' 1"  (1.549 m), weight 47.6 kg, last menstrual period 08/21/2019, SpO2 100 %.Body mass index is 19.84 kg/m.  General Appearance: Disheveled  Eye Contact:  Fair  Speech:  Slow  Volume:  Decreased  Mood:  Depressed  Affect:  Constricted  Thought Process:  Disorganized  Orientation:  Full (Time, Place, and Person)  Thought Content:  Illogical, Hallucinations: Auditory, Paranoid Ideation and Rumination  Suicidal Thoughts:  No  Homicidal Thoughts:  No  Memory:  Immediate;   Fair Recent;   Fair Remote;   Fair  Judgement:  Impaired  Insight:  Shallow  Psychomotor Activity:  Decreased  Concentration:  Concentration: Fair  Recall:  [employer] of Knowledge:  Fair  Language:  Fair  Akathisia:  No  Handed:  Right  AIMS (if indicated):     Assets:  Desire for Improvement  ADL's:  Intact  Cognition:  Impaired,  Mild  Sleep:  Number of Hours: 4  COGNITIVE FEATURES THAT CONTRIBUTE TO RISK:  Thought constriction (tunnel vision)    SUICIDE RISK:   Mild:  Suicidal ideation of limited frequency, intensity, duration, and specificity.  There are  no identifiable plans, no associated intent, mild dysphoria and related symptoms, good self-control (both objective and subjective assessment), few other risk factors, and identifiable protective factors, including available and accessible social support.  PLAN OF CARE: Continue 15-minute checks.  Restart medication.  Monitor for behavior problems.  Engage in individual and group therapy.  I certify that inpatient services furnished can reasonably be expected to improve the patient's condition.   Alethia Berthold, MD 08/31/2019, 6:58 PM

## 2019-09-01 NOTE — Progress Notes (Signed)
Patient alert and oriented x 3 with periods of confusion to situation, , she was noted asleep  in the room not interacting with peers and staff, she was encouraged to come out but she wasn't receptive. Patient refused evening medication even when writer took to bedside she refused to open her eyes and take medication. 15 minutes safety checks maintained will continue to monitor.

## 2019-09-01 NOTE — Progress Notes (Signed)
[facility] MD Progress Note  09/01/2019 2:59 PM Autumn [name]  MRN:  [id number] Subjective: Follow-up for this patient with bipolar disorder manic or mixed.  Patient seen this morning.  She is awake in bed and very withdrawn.  She indicates that she is still feeling very bad.  I noticed that she came out of bed earlier and immediately seemed to be angry and irritable and stormed back into her room.  Admits to still having hallucinations.  Tolerating medicines however.  No acute side effects complained of.  Vital signs all stable.  Most recent labs do show she has impaired levels of lithium and Depakote on first try. Principal Problem: Bipolar 1 disorder, manic, moderate (HCC) Diagnosis: Principal Problem:   Bipolar 1 disorder, manic, moderate (HCC) Active Problems:   Alcohol abuse  Total Time spent with patient: 30 minutes  Past Psychiatric History: Past history of recurrent hospitalizations with psychosis and mood instability  Past Medical History:  Past Medical History:  Diagnosis Date  . Allergy    Seasonal, Ultram (seizures)  . Anemia 2005  . Bipolar 1 disorder ([place])   . Glaucoma   . Seizures ([place]) 2008    Past Surgical History:  Procedure Laterality Date  . BREAST BIOPSY Right    x 2  . CESAREAN SECTION     x 2  . LEEP  2000   Family History:  Family History  Problem Relation Age of Onset  . Hypertension Mother   . Hyperlipidemia Mother   . Asthma Mother   . Parkinson's disease Father   . Lung cancer Maternal Grandmother   . Other Paternal Grandfather 49       Cardiac arrest  . Heart disease Paternal Grandfather    Family Psychiatric  History: See previous Social History:  Social History   Substance and Sexual Activity  Alcohol Use Yes  . Alcohol/week: 4.0 standard drinks  . Types: 4 Cans of beer per week   Comment: Ocassional     Social History   Substance and Sexual Activity  Drug Use Yes  . Types: Marijuana   Comment: percocet -last use over 1 week  ago. Heroin-last used 2/11    Social History   Socioeconomic History  . Marital status: Single    Spouse name: Not on file  . Number of children: Not on file  . Years of education: Not on file  . Highest education level: Not on file  Occupational History  . Not on file  Social Needs  . Financial resource strain: Not on file  . Food insecurity    Worry: Not on file    Inability: Not on file  . Transportation needs    Medical: Not on file    Non-medical: Not on file  Tobacco Use  . Smoking status: Current Every Day Smoker    Packs/day: 1.00    Years: 9.00    Pack years: 9.00  . Smokeless tobacco: Never Used  . Tobacco comment: Patient not interested  Substance and Sexual Activity  . Alcohol use: Yes    Alcohol/week: 4.0 standard drinks    Types: 4 Cans of beer per week    Comment: Ocassional  . Drug use: Yes    Types: Marijuana    Comment: percocet -last use over 1 week ago. Heroin-last used 2/11  . Sexual activity: Yes    Birth control/protection: None  Lifestyle  . Physical activity    Days per week: Not on file    Minutes  per session: Not on file  . Stress: Not on file  Relationships  . Social Herbalist on phone: Not on file    Gets together: Not on file    Attends religious service: Not on file    Active member of club or organization: Not on file    Attends meetings of clubs or organizations: Not on file    Relationship status: Not on file  Other Topics Concern  . Not on file  Social History Narrative  . Not on file   Additional Social History:                         Sleep: Fair  Appetite:  Fair  Current Medications: Current Facility-Administered Medications  Medication Dose Route Frequency Provider Last Rate Last Dose  . acetaminophen (TYLENOL) tablet 650 mg  650 mg Oral Q6H PRN Caroline Sauger, NP   650 mg at 08/31/19 1558  . alum & mag hydroxide-simeth (MAALOX/MYLANTA) 200-200-20 MG/5ML suspension 30 mL  30 mL Oral Q4H  PRN Caroline Sauger, NP      . ARIPiprazole (ABILIFY) tablet 20 mg  20 mg Oral Daily Caroline Sauger, NP   20 mg at 09/01/19 0841  . divalproex (DEPAKOTE) DR tablet 500 mg  500 mg Oral Q12H Caroline Sauger, NP   500 mg at 09/01/19 0841  . hydrOXYzine (ATARAX/VISTARIL) tablet 25 mg  25 mg Oral TID PRN Caroline Sauger, NP   25 mg at 08/31/19 0808  . ibuprofen (ADVIL) tablet 600 mg  600 mg Oral Q6H PRN Caroline Sauger, NP      . lithium carbonate (ESKALITH) CR tablet 450 mg  450 mg Oral Q12H Caroline Sauger, NP   450 mg at 09/01/19 0841  . magnesium hydroxide (MILK OF MAGNESIA) suspension 30 mL  30 mL Oral Daily PRN Caroline Sauger, NP      . nicotine (NICODERM CQ - dosed in mg/24 hours) patch 21 mg  21 mg Transdermal Daily Debara Kamphuis, [name], MD   21 mg at 09/01/19 0840  . Norgestimate-Ethinyl Estradiol Triphasic 0.18/0.215/0.25 MG-35 MCG tablet 1 tablet  1 tablet Oral Daily Caroline Sauger, NP        Lab Results:  Results for orders placed or performed during the hospital encounter of 08/31/19 (from the past 48 hour(s))  Lithium level     Status: Abnormal   Collection Time: 08/31/19  4:05 PM  Result Value Ref Range   Lithium Lvl 0.45 (L) 0.60 - 1.20 mmol/L    Comment: Performed at [facility], [address] [place], [place] [address]  Valproic acid level     Status: None   Collection Time: 08/31/19  4:05 PM  Result Value Ref Range   Valproic Acid Lvl 62 50.0 - 100.0 ug/mL    Comment: Performed at [facility], [address] [place], [place] [address]    Blood Alcohol level:  Lab Results  Component Value Date   [facility] <10 08/30/2019   ETH <10 0000000    Metabolic Disorder Labs: Lab Results  Component Value Date   HGBA1C 5.0 11/21/2018   MPG 96.8 11/21/2018   MPG 93.93 04/13/2018   No results found for: PROLACTIN Lab Results  Component Value Date   CHOL 196 11/21/2018   TRIG 97 11/21/2018   HDL 53 11/21/2018    CHOLHDL 3.7 11/21/2018   VLDL 19 11/21/2018   LDLCALC 124 (H) 11/21/2018   LDLCALC 117 (H)  04/13/2018    Physical Findings: AIMS:  , ,  ,  ,    CIWA:    COWS:     Musculoskeletal: Strength & Muscle Tone: decreased Gait & Station: unsteady Patient leans: N/A  Psychiatric Specialty Exam: Physical Exam  Nursing note and vitals reviewed. Constitutional: She appears well-developed and well-nourished.  HENT:  Head: Normocephalic and atraumatic.  Eyes: Pupils are equal, round, and reactive to light. Conjunctivae are normal.  Neck: Normal range of motion.  Cardiovascular: Regular rhythm and normal heart sounds.  Respiratory: Effort normal. No respiratory distress.  GI: Soft.  Musculoskeletal: Normal range of motion.  Neurological: She is alert.  Skin: Skin is warm and dry.  Psychiatric: Her affect is blunt. Her speech is delayed. She is slowed and withdrawn. Thought content is paranoid. Cognition and memory are impaired. She expresses impulsivity. She exhibits a depressed mood. She expresses suicidal ideation. She expresses no suicidal plans.    Review of Systems  Constitutional: Negative.   HENT: Negative.   Eyes: Negative.   Respiratory: Negative.   Cardiovascular: Negative.   Gastrointestinal: Negative.   Musculoskeletal: Negative.   Skin: Negative.   Neurological: Negative.   Psychiatric/Behavioral: Positive for depression and hallucinations. Negative for substance abuse and suicidal ideas. The patient is nervous/anxious and has insomnia.     Blood pressure 114/82, pulse 85, temperature 98 F (36.7 C), temperature source Oral, resp. rate 16, height 5\' 1"  (1.549 m), weight 47.6 kg, last menstrual period 08/21/2019, SpO2 100 %.Body mass index is 19.84 kg/m.  General Appearance: Disheveled  Eye Contact:  Minimal  Speech:  Slow  Volume:  Decreased  Mood:  Anxious, Depressed, Dysphoric and Irritable  Affect:  Congruent  Thought Process:  Coherent  Orientation:  Full  (Time, Place, and Person)  Thought Content:  Illogical and Rumination  Suicidal Thoughts:  Yes.  without intent/plan  Homicidal Thoughts:  No  Memory:  Immediate;   Fair Recent;   Fair Remote;   Fair  Judgement:  Impaired  Insight:  Shallow  Psychomotor Activity:  Decreased  Concentration:  Concentration: Fair  Recall:  [employer] of Knowledge:  Fair  Language:  Fair  Akathisia:  No  Handed:  Right  AIMS (if indicated):     Assets:  Desire for Improvement Physical Health  ADL's:  Impaired  Cognition:  Impaired,  Mild  Sleep:  Number of Hours: 8.15     Treatment Plan Summary: Daily contact with patient to assess and evaluate symptoms and progress in treatment, Medication management and Plan Patient with chronic mental illness and chronic noncompliance who has not been able to stay stable continually living outside and then getting herself in trouble.  Remains psychotic withdrawn but at least is cooperative with medicine.  Denies any intention to hurt herself or anyone else.  Continue 15-minute checks.  Continue current medication dosages since we know it had been effective for her in the past.  Probably recheck blood levels in another couple days.  Encourage patient to ask for what she needs especially if she is feeling much worse.  Alethia Berthold, MD 09/01/2019, 2:59 PM

## 2019-09-01 NOTE — BHH Group Notes (Addendum)
LCSW Group Therapy Note  Date/Time:  09/01/2019   1:00PM  Type of Therapy and Topic:  Group Therapy:  Healthy vs Unhealthy Coping Skills  Participation Level:  Did Not Attend   Description of Group:  The focus of this group was to determine what unhealthy coping techniques typically are used by group members and what healthy coping techniques would be helpful in coping with various problems. Patients were guided in becoming aware of the differences between healthy and unhealthy coping techniques.  Patients were asked to identify 1 unhealthy coping skill they used prior to this hospitalization. Patients were then asked to identify 1-2 healthy coping skills they like to use, and many mentioned listening to music, coloring and taking a hot shower. These were further explored on how to implement them more effectively after discharge.   At the end of group, additional ideas of healthy coping skills were shared in discussion.   Therapeutic Goals 1. Patients learned that coping is what human beings do all day long to deal with various situations in their lives 2. Patients defined and discussed healthy vs unhealthy coping techniques 3. Patients identified their preferred coping techniques and identified whether these were healthy or unhealthy 4. Patients determined 1-2 healthy coping skills they would like to become more familiar with and use more often, and practiced a few meditations 5. Patients provided support and ideas to each other  Summary of Patient Progress: Patient did not attend group.    Therapeutic Modalities Cognitive Behavioral Therapy Motivational Interviewing Solution Focused Therapy Brief Therapy    Netta Neat, MSW, LCSW Clinical Social Work 09/01/2019
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
## 2019-09-01 NOTE — Tx Team (Signed)
Interdisciplinary Treatment and Diagnostic Plan Update  09/01/2019 Time of Session: 10:30am Autumn [name] MRN: [id number]  Principal Diagnosis: Bipolar 1 disorder, manic, moderate (HCC)  Secondary Diagnoses: Principal Problem:   Bipolar 1 disorder, manic, moderate ([place]) Active Problems:   Alcohol abuse   Current Medications:  Current Facility-Administered Medications  Medication Dose Route Frequency Provider Last Rate Last Dose  . acetaminophen (TYLENOL) tablet 650 mg  650 mg Oral Q6H PRN Caroline Sauger, NP   650 mg at 08/31/19 1558  . alum & mag hydroxide-simeth (MAALOX/MYLANTA) 200-200-20 MG/5ML suspension 30 mL  30 mL Oral Q4H PRN Caroline Sauger, NP      . ARIPiprazole (ABILIFY) tablet 20 mg  20 mg Oral Daily Caroline Sauger, NP   20 mg at 09/01/19 0841  . divalproex (DEPAKOTE) DR tablet 500 mg  500 mg Oral Q12H Caroline Sauger, NP   500 mg at 09/01/19 0841  . hydrOXYzine (ATARAX/VISTARIL) tablet 25 mg  25 mg Oral TID PRN Caroline Sauger, NP   25 mg at 08/31/19 0808  . ibuprofen (ADVIL) tablet 600 mg  600 mg Oral Q6H PRN Caroline Sauger, NP      . lithium carbonate (ESKALITH) CR tablet 450 mg  450 mg Oral Q12H Caroline Sauger, NP   450 mg at 09/01/19 0841  . magnesium hydroxide (MILK OF MAGNESIA) suspension 30 mL  30 mL Oral Daily PRN Caroline Sauger, NP      . nicotine (NICODERM CQ - dosed in mg/24 hours) patch 21 mg  21 mg Transdermal Daily Clapacs, [name], MD   21 mg at 09/01/19 0840  . Norgestimate-Ethinyl Estradiol Triphasic 0.18/0.215/0.25 MG-35 MCG tablet 1 tablet  1 tablet Oral Daily Caroline Sauger, NP       PTA Medications: Medications Prior to Admission  Medication Sig Dispense Refill Last Dose  . amoxicillin (AMOXIL) 500 MG capsule Take 1 capsule (500 mg total) by mouth 3 (three) times daily. (Patient not taking: Reported on 08/21/2019) 30 capsule 0   . ARIPiprazole (ABILIFY) 20 MG tablet Take 1 tablet (20 mg total)  by mouth daily. (Patient not taking: Reported on 08/21/2019) 30 tablet 2   . divalproex (DEPAKOTE) 500 MG DR tablet Take 1 tablet (500 mg total) by mouth every 12 (twelve) hours. (Patient not taking: Reported on 08/21/2019) 60 tablet 2   . hydrOXYzine (ATARAX/VISTARIL) 25 MG tablet Take 1 tablet (25 mg total) by mouth 3 (three) times daily as needed for anxiety. (Patient not taking: Reported on 08/21/2019) 60 tablet 2   . lidocaine (XYLOCAINE) 2 % solution Use as directed 5 mLs in the mouth or throat every 6 (six) hours as needed for mouth pain. Oral swish (Patient not taking: Reported on 08/30/2019) 100 mL 0   . lithium carbonate (ESKALITH) 450 MG CR tablet Take 1 tablet (450 mg total) by mouth every 12 (twelve) hours. (Patient not taking: Reported on 08/21/2019) 60 tablet 2   . Norgestimate-Ethinyl Estradiol Triphasic (ORTHO TRI-CYCLEN, 28,) 0.18/0.215/0.25 MG-35 MCG tablet Take 1 tablet by mouth daily.     . traZODone (DESYREL) 100 MG tablet Take 2 tablets (200 mg total) by mouth at bedtime. (Patient not taking: Reported on 08/21/2019) 60 tablet 2     Patient Stressors:    Patient Strengths:    Treatment Modalities: Medication Management, Group therapy, Case management,  1 to 1 session with clinician, Psychoeducation, Recreational therapy.   Physician Treatment Plan for Primary Diagnosis: Bipolar 1 disorder, manic, moderate (HCC) Long Term Goal(s): Improvement in symptoms  so as ready for discharge Improvement in symptoms so as ready for discharge   Short Term Goals: Ability to verbalize feelings will improve Ability to demonstrate self-control will improve Compliance with prescribed medications will improve  Medication Management: Evaluate patient's response, side effects, and tolerance of medication regimen.  Therapeutic Interventions: 1 to 1 sessions, Unit Group sessions and Medication administration.  Evaluation of Outcomes: Progressing  Physician Treatment Plan for Secondary  Diagnosis: Principal Problem:   Bipolar 1 disorder, manic, moderate (HCC) Active Problems:   Alcohol abuse  Long Term Goal(s): Improvement in symptoms so as ready for discharge Improvement in symptoms so as ready for discharge   Short Term Goals: Ability to verbalize feelings will improve Ability to demonstrate self-control will improve Compliance with prescribed medications will improve     Medication Management: Evaluate patient's response, side effects, and tolerance of medication regimen.  Therapeutic Interventions: 1 to 1 sessions, Unit Group sessions and Medication administration.  Evaluation of Outcomes: Progressing   RN Treatment Plan for Primary Diagnosis: Bipolar 1 disorder, manic, moderate ([place]) Long Term Goal(s): Knowledge of disease and therapeutic regimen to maintain health will improve  Short Term Goals: Ability to remain Autumn from injury will improve, Ability to verbalize frustration and anger appropriately will improve, Ability to demonstrate self-control, Ability to participate in decision making will improve, Ability to verbalize feelings will improve, Ability to disclose and discuss suicidal ideas, Ability to identify and develop effective coping behaviors will improve and Compliance with prescribed medications will improve  Medication Management: RN will administer medications as ordered by provider, will assess and evaluate patient's response and provide education to patient for prescribed medication. RN will report any adverse and/or side effects to prescribing provider.  Therapeutic Interventions: 1 on 1 counseling sessions, Psychoeducation, Medication administration, Evaluate responses to treatment, Monitor vital signs and CBGs as ordered, Perform/monitor CIWA, COWS, AIMS and Fall Risk screenings as ordered, Perform wound care treatments as ordered.  Evaluation of Outcomes: Progressing   LCSW Treatment Plan for Primary Diagnosis: Bipolar 1 disorder, manic,  moderate ([place]) Long Term Goal(s): Safe transition to appropriate next level of care at discharge, Engage patient in therapeutic group addressing interpersonal concerns.  Short Term Goals: Engage patient in aftercare planning with referrals and resources, Increase social support, Increase ability to appropriately verbalize feelings, Increase emotional regulation, Facilitate acceptance of mental health diagnosis and concerns, Facilitate patient progression through stages of change regarding substance use diagnoses and concerns, Identify triggers associated with mental health/substance abuse issues and Increase skills for wellness and recovery  Therapeutic Interventions: Assess for all discharge needs, 1 to 1 time with Social worker, Explore available resources and support systems, Assess for adequacy in community support network, Educate family and significant other(s) on suicide prevention, Complete Psychosocial Assessment, Interpersonal group therapy.  Evaluation of Outcomes: Progressing   Progress in Treatment: Attending groups: Yes. Participating in groups: Yes. Taking medication as prescribed: Yes. Toleration medication: Yes. Family/Significant other contact made: No, will contact:  Patient responds there is no one to contactl because she is homeless. Patient understands diagnosis: Yes. Discussing patient identified problems/goals with staff: Yes. Medical problems stabilized or resolved: Yes. Denies suicidal/homicidal ideation: Patient able to contract for safety on unit.  Issues/concerns per patient self-inventory: No. Other: NA  New problem(s) identified: No, Describe:  None  New Short Term/Long Term Goal(s):  Engage patient in aftercare planning with referrals and resources, Increase social support, Increase emotional regulation, Facilitate acceptance of mental health diagnosis and concerns, Increase skills for wellness  and recovery  Patient Goals:  "I want to go to groups to learn  how to cope with PTSD."  Discharge Plan or Barriers: Patient is homeless and is asking if she can live with a particular female after she discharges.  Reason for Continuation of Hospitalization: Mania  Estimated Length of Stay:  3-5 days Attendees: Patient:  Autumn [name] 09/01/2019 10:59 AM  Physician: Dr. Weber Cooks 09/01/2019 10:59 AM  Nursing: Javier Glazier, RN 09/01/2019 10:59 AM  RN Care Manager: 09/01/2019 10:59 AM  Social Worker: Netta Neat, LCSW 09/01/2019 10:59 AM  Recreational Therapist:  09/01/2019 10:59 AM  Other:  09/01/2019 10:59 AM  Other:  09/01/2019 10:59 AM  Other: 09/01/2019 10:59 AM    Scribe for Treatment Team:  Netta Neat, MSW, LCSW Clinical Social Work 09/01/2019 10:59 AM

## 2019-09-01 NOTE — Plan of Care (Signed)
Patient denies SI/HI/AVH. Patient is bizarre and mildly confused. Patient becomes agitated easily. Patient's safety is maintained on unit. Patient is adherent with scheduled medications.    Problem: Education: Goal: Knowledge of [facility] Education information/materials will improve Outcome: Not Progressing Goal: Emotional status will improve Outcome: Not Progressing Goal: Mental status will improve Outcome: Not Progressing

## 2019-09-02 NOTE — Plan of Care (Signed)
D- Patient alert and oriented. Patient presents in a depressed, but pleasant mood on assessment stating that she slept good last night and had no complaints to voice to this Probation officer. Patient denies signs/symptoms of depression/anxiety, stating "no ma'am". Patient also denies SI, HI, AVH, and pain at this time. Patient's goal for today is to "work on anxiety coping skills", in which she will "try to block hurtful thoughts", in order to achieve her goal.  A- Scheduled medications administered to patient, per MD orders. Support and encouragement provided.  Routine safety checks conducted every 15 minutes.  Patient informed to notify staff with problems or concerns.  R- No adverse drug reactions noted. Patient contracts for safety at this time. Patient compliant with medications and treatment plan. Patient receptive, calm, and cooperative. Patient interacts well with others on the unit.  Patient remains safe at this time.  Problem: Education: Goal: Knowledge of [facility] Education information/materials will improve Outcome: Progressing Goal: Emotional status will improve Outcome: Progressing Goal: Mental status will improve Outcome: Progressing Goal: Verbalization of understanding the information provided will improve Outcome: Progressing   Problem: Safety: Goal: Periods of time without injury will increase Outcome: Progressing   Problem: Education: Goal: Will be free of psychotic symptoms Outcome: Progressing Goal: Knowledge of the prescribed therapeutic regimen will improve Outcome: Progressing   Problem: Safety: Goal: Ability to redirect hostility and anger into socially appropriate behaviors will improve Outcome: Progressing Goal: Ability to remain free from injury will improve Outcome: Progressing

## 2019-09-02 NOTE — Plan of Care (Signed)
Patient denies SI/HI/AVH. Patient is bizarre and mildly confused. Patient becomes agitated easily. The patient did come out of her room for snack time. Patient's safety is maintained on unit. Patient is adherent with scheduled medications. Patient is asleep.   Problem: Education: Goal: Knowledge of [facility] Education information/materials will improve Outcome: Not Progressing Goal: Emotional status will improve Outcome: Not Progressing Goal: Mental status will improve Outcome: Not Progressing

## 2019-09-02 NOTE — Progress Notes (Signed)
Patient came up to this writer and asked for her morning medications.

## 2019-09-02 NOTE — BHH Group Notes (Signed)
LCSW Group Therapy Note 09/02/2019 1:15pm  Type of Therapy and Topic: Group Therapy: Feelings Around Returning Home & Establishing a Supportive Framework and Supporting Oneself When Supports Not Available  Participation Level: Did Not Attend  Description of Group:  Patients first processed thoughts and feelings about upcoming discharge. These included fears of upcoming changes, lack of change, new living environments, judgements and expectations from others and overall stigma of mental health issues. The group then discussed the definition of a supportive framework, what that looks and feels like, and how do to discern it from an unhealthy non-supportive network. The group identified different types of supports as well as what to do when your family/friends are less than helpful or unavailable  Therapeutic Goals  1. Patient will identify one healthy supportive network that they can use at discharge. 2. Patient will identify one factor of a supportive framework and how to tell it from an unhealthy network. 3. Patient able to identify one coping skill to use when they do not have positive supports from others. 4. Patient will demonstrate ability to communicate their needs through discussion and/or role plays.  Summary of Patient Progress:  Patient was invited to group, however she did not attend.    Therapeutic Modalities Cognitive Behavioral Therapy Motivational Interviewing   Autumn [name]  CUEBAS-COLON, LCSW 09/02/2019 10:24 AM

## 2019-09-02 NOTE — Progress Notes (Signed)
Patient refused her morning medications. When this writer asked patient why she didn't want to take her medications, she stated "I don't know". MD will be notified.

## 2019-09-02 NOTE — Progress Notes (Signed)
[facility] MD Progress Note  09/02/2019 1:19 PM Autumn [name]  MRN:  [id number] Subjective: Follow-up for this patient with bipolar disorder with mixed manic symptoms.  Still not getting out of her room very much but has come to some groups.  Slept a little bit better last night.  Refused to take her medicine last night and when asked about it has no rational reason for it but says that she is agreeable to continuing her medicines at this point.  She is still having hallucinations and feels paranoid at times.  Denies suicidal plans not aggressive or violent right now. Principal Problem: Bipolar 1 disorder, manic, moderate (HCC) Diagnosis: Principal Problem:   Bipolar 1 disorder, manic, moderate (HCC) Active Problems:   Alcohol abuse  Total Time spent with patient: 30 minutes  Past Psychiatric History: Patient has a long history of bipolar disorder or schizoaffective disorder with recurrent psychoses and noncompliance  Past Medical History:  Past Medical History:  Diagnosis Date  . Allergy    Seasonal, Ultram (seizures)  . Anemia 2005  . Bipolar 1 disorder ([place])   . Glaucoma   . Seizures ([place]) 2008    Past Surgical History:  Procedure Laterality Date  . BREAST BIOPSY Right    x 2  . CESAREAN SECTION     x 2  . LEEP  2000   Family History:  Family History  Problem Relation Age of Onset  . Hypertension Mother   . Hyperlipidemia Mother   . Asthma Mother   . Parkinson's disease Father   . Lung cancer Maternal Grandmother   . Other Paternal Grandfather 64       Cardiac arrest  . Heart disease Paternal Grandfather    Family Psychiatric  History: See previous Social History:  Social History   Substance and Sexual Activity  Alcohol Use Yes  . Alcohol/week: 4.0 standard drinks  . Types: 4 Cans of beer per week   Comment: Ocassional     Social History   Substance and Sexual Activity  Drug Use Yes  . Types: Marijuana   Comment: percocet -last use over 1 week ago.  Heroin-last used 2/11    Social History   Socioeconomic History  . Marital status: Single    Spouse name: Not on file  . Number of children: Not on file  . Years of education: Not on file  . Highest education level: Not on file  Occupational History  . Not on file  Social Needs  . Financial resource strain: Not on file  . Food insecurity    Worry: Not on file    Inability: Not on file  . Transportation needs    Medical: Not on file    Non-medical: Not on file  Tobacco Use  . Smoking status: Current Every Day Smoker    Packs/day: 1.00    Years: 9.00    Pack years: 9.00  . Smokeless tobacco: Never Used  . Tobacco comment: Patient not interested  Substance and Sexual Activity  . Alcohol use: Yes    Alcohol/week: 4.0 standard drinks    Types: 4 Cans of beer per week    Comment: Ocassional  . Drug use: Yes    Types: Marijuana    Comment: percocet -last use over 1 week ago. Heroin-last used 2/11  . Sexual activity: Yes    Birth control/protection: None  Lifestyle  . Physical activity    Days per week: Not on file    Minutes per session: Not  on file  . Stress: Not on file  Relationships  . Social Herbalist on phone: Not on file    Gets together: Not on file    Attends religious service: Not on file    Active member of club or organization: Not on file    Attends meetings of clubs or organizations: Not on file    Relationship status: Not on file  Other Topics Concern  . Not on file  Social History Narrative  . Not on file   Additional Social History:                         Sleep: Fair  Appetite:  Fair  Current Medications: Current Facility-Administered Medications  Medication Dose Route Frequency Provider Last Rate Last Dose  . acetaminophen (TYLENOL) tablet 650 mg  650 mg Oral Q6H PRN Caroline Sauger, NP   650 mg at 08/31/19 1558  . alum & mag hydroxide-simeth (MAALOX/MYLANTA) 200-200-20 MG/5ML suspension 30 mL  30 mL Oral Q4H PRN  Caroline Sauger, NP      . ARIPiprazole (ABILIFY) tablet 20 mg  20 mg Oral Daily Caroline Sauger, NP   20 mg at 09/02/19 1208  . divalproex (DEPAKOTE) DR tablet 500 mg  500 mg Oral Q12H Caroline Sauger, NP   500 mg at 09/02/19 1208  . hydrOXYzine (ATARAX/VISTARIL) tablet 25 mg  25 mg Oral TID PRN Caroline Sauger, NP   25 mg at 08/31/19 0808  . ibuprofen (ADVIL) tablet 600 mg  600 mg Oral Q6H PRN Caroline Sauger, NP      . lithium carbonate (ESKALITH) CR tablet 450 mg  450 mg Oral Q12H Caroline Sauger, NP   450 mg at 09/02/19 1208  . magnesium hydroxide (MILK OF MAGNESIA) suspension 30 mL  30 mL Oral Daily PRN Caroline Sauger, NP      . nicotine (NICODERM CQ - dosed in mg/24 hours) patch 21 mg  21 mg Transdermal Daily Johnnye Sandford, [name], MD   21 mg at 09/02/19 1209  . Norgestimate-Ethinyl Estradiol Triphasic 0.18/0.215/0.25 MG-35 MCG tablet 1 tablet  1 tablet Oral Daily Caroline Sauger, NP        Lab Results:  Results for orders placed or performed during the hospital encounter of 08/31/19 (from the past 48 hour(s))  Lithium level     Status: Abnormal   Collection Time: 08/31/19  4:05 PM  Result Value Ref Range   Lithium Lvl 0.45 (L) 0.60 - 1.20 mmol/L    Comment: Performed at [facility], [address] [place], [place] [address]  Valproic acid level     Status: None   Collection Time: 08/31/19  4:05 PM  Result Value Ref Range   Valproic Acid Lvl 62 50.0 - 100.0 ug/mL    Comment: Performed at [facility], [address] [place], [place] [address]    Blood Alcohol level:  Lab Results  Component Value Date   [facility] <10 08/30/2019   ETH <10 0000000    Metabolic Disorder Labs: Lab Results  Component Value Date   HGBA1C 5.0 11/21/2018   MPG 96.8 11/21/2018   MPG 93.93 04/13/2018   No results found for: PROLACTIN Lab Results  Component Value Date   CHOL 196 11/21/2018   TRIG 97 11/21/2018   HDL 53 11/21/2018   CHOLHDL  3.7 11/21/2018   VLDL 19 11/21/2018   LDLCALC 124 (H) 11/21/2018   LDLCALC 117 (H) 04/13/2018  Physical Findings: AIMS:  , ,  ,  ,    CIWA:    COWS:     Musculoskeletal: Strength & Muscle Tone: within normal limits Gait & Station: normal Patient leans: N/A  Psychiatric Specialty Exam: Physical Exam  Nursing note and vitals reviewed. Constitutional: She appears well-developed and well-nourished.  HENT:  Head: Normocephalic and atraumatic.  Eyes: Pupils are equal, round, and reactive to light. Conjunctivae are normal.  Neck: Normal range of motion.  Cardiovascular: Regular rhythm and normal heart sounds.  Respiratory: Effort normal. No respiratory distress.  GI: Soft.  Musculoskeletal: Normal range of motion.  Neurological: She is alert.  Skin: Skin is warm and dry.  Psychiatric: Her mood appears anxious. Her affect is blunt. Her speech is delayed. She is slowed and actively hallucinating. Thought content is paranoid. She expresses inappropriate judgment. She expresses no homicidal and no suicidal ideation. She exhibits abnormal recent memory.    Review of Systems  Constitutional: Negative.   HENT: Negative.   Eyes: Negative.   Respiratory: Negative.   Cardiovascular: Negative.   Gastrointestinal: Negative.   Musculoskeletal: Negative.   Skin: Negative.   Neurological: Negative.   Psychiatric/Behavioral: Positive for depression and hallucinations. Negative for substance abuse and suicidal ideas. The patient is nervous/anxious and has insomnia.     Blood pressure 107/67, pulse 74, temperature 97.7 F (36.5 C), temperature source Oral, resp. rate 18, height 5\' 1"  (1.549 m), weight 47.6 kg, last menstrual period 08/21/2019, SpO2 100 %.Body mass index is 19.84 kg/m.  General Appearance: Casual  Eye Contact:  Good  Speech:  Slow  Volume:  Decreased  Mood:  Anxious  Affect:  Blunt  Thought Process:  Coherent  Orientation:  Full (Time, Place, and Person)  Thought  Content:  Illogical and Hallucinations: Auditory  Suicidal Thoughts:  No  Homicidal Thoughts:  No  Memory:  Immediate;   Fair Recent;   Fair Remote;   Fair  Judgement:  Impaired  Insight:  Shallow  Psychomotor Activity:  Decreased  Concentration:  Concentration: Fair  Recall:  [employer] of Knowledge:  Fair  Language:  Fair  Akathisia:  No  Handed:  Right  AIMS (if indicated):     Assets:  Desire for Improvement Physical Health  ADL's:  Impaired  Cognition:  Impaired,  Mild  Sleep:  Number of Hours: 6.25     Treatment Plan Summary: Daily contact with patient to assess and evaluate symptoms and progress in treatment, Medication management and Plan Continue current psychiatric medicine.  Reviewed with the patient why it is important to be compliant.  She agrees to do so and has no argument.  Probably check a blood level in another 2 days given that she missed a dose.  Probable length of stay on the order of 2 to 4 days.  No change in dosage or treatment plan right now.  Encourage her going to group therapy.  Alethia Berthold, MD 09/02/2019, 1:19 PM

## 2019-09-03 DIAGNOSIS — F3112 Bipolar disorder, current episode manic without psychotic features, moderate: Principal | ICD-10-CM

## 2019-09-03 NOTE — BHH Counselor (Signed)
CSW spoke with the patient regarding treatment referrals.  Patient declines a referral to [facility], [facility] or [facility].    CSW attempted to talk with patient about [facility] program and [facility] at the [facility] and patient walked away without stating anything.    Patient earlier in the conversation stated that she wants a referral to [facility].  Assunta Curtis, MSW, LCSW 09/03/2019 2:20 PM

## 2019-09-03 NOTE — Progress Notes (Signed)
D - Patient was in her room upon arrival to the unit. Patient was pleasant during assessment denying SI/HI/AVH, pain, anxiety and depression with this Probation officer. Patient presents with a better affect than she had upon admission. Patient calm and cooperative, coming out for snack and medication administration.   A - Patient compliant with medication administration per MD orders and procedures on the unit. Patient given education. Patient given support and encouragement. Patient informed to let staff know if there are any issues or problems on the unit.   R - Patient being monitored Q 15 minutes for safety per unit protocol. Patient remains safe on the unit.

## 2019-09-03 NOTE — B[facility] Counselor (Signed)
CSW faxed referral to [facility].  CSW received confirmation fax was successful.    CSW called [phone] to find out what information was needed.  CSW was informed that the following.  Assunta Curtis, MSW, LCSW 09/05/2019 8:22 AM

## 2019-09-03 NOTE — Progress Notes (Signed)
[facility] MD Progress Note  09/03/2019 5:09 PM Autumn [name]  MRN:  [id number] Subjective: Follow-up with this patient with bipolar disorder.  Patient continues to be pretty withdrawn stays in her bed most of the time.  Not going to many groups.  She says she is still feeling depressed and anxious.  She admits to still hearing voices part of the day.  Denies any suicidal intent or plan but feels very anxious.  No complaints about her medicines and she is back on the medicines that have been effective for her in the past.  No new physical complaints. Principal Problem: Bipolar 1 disorder, manic, moderate (HCC) Diagnosis: Principal Problem:   Bipolar 1 disorder, manic, moderate (HCC) Active Problems:   Alcohol abuse  Total Time spent with patient: 30 minutes  Past Psychiatric History: Past history of recurrent episodes of bipolar disorder with mixed and depressive episodes  Past Medical History:  Past Medical History:  Diagnosis Date  . Allergy    Seasonal, Ultram (seizures)  . Anemia 2005  . Bipolar 1 disorder ([place])   . Glaucoma   . Seizures ([place]) 2008    Past Surgical History:  Procedure Laterality Date  . BREAST BIOPSY Right    x 2  . CESAREAN SECTION     x 2  . LEEP  2000   Family History:  Family History  Problem Relation Age of Onset  . Hypertension Mother   . Hyperlipidemia Mother   . Asthma Mother   . Parkinson's disease Father   . Lung cancer Maternal Grandmother   . Other Paternal Grandfather 49       Cardiac arrest  . Heart disease Paternal Grandfather    Family Psychiatric  History: See previous Social History:  Social History   Substance and Sexual Activity  Alcohol Use Yes  . Alcohol/week: 4.0 standard drinks  . Types: 4 Cans of beer per week   Comment: Ocassional     Social History   Substance and Sexual Activity  Drug Use Yes  . Types: Marijuana   Comment: percocet -last use over 1 week ago. Heroin-last used 2/11    Social History    Socioeconomic History  . Marital status: Single    Spouse name: Not on file  . Number of children: Not on file  . Years of education: Not on file  . Highest education level: Not on file  Occupational History  . Not on file  Social Needs  . Financial resource strain: Not on file  . Food insecurity    Worry: Not on file    Inability: Not on file  . Transportation needs    Medical: Not on file    Non-medical: Not on file  Tobacco Use  . Smoking status: Current Every Day Smoker    Packs/day: 1.00    Years: 9.00    Pack years: 9.00  . Smokeless tobacco: Never Used  . Tobacco comment: Patient not interested  Substance and Sexual Activity  . Alcohol use: Yes    Alcohol/week: 4.0 standard drinks    Types: 4 Cans of beer per week    Comment: Ocassional  . Drug use: Yes    Types: Marijuana    Comment: percocet -last use over 1 week ago. Heroin-last used 2/11  . Sexual activity: Yes    Birth control/protection: None  Lifestyle  . Physical activity    Days per week: Not on file    Minutes per session: Not on file  .  Stress: Not on file  Relationships  . Social Herbalist on phone: Not on file    Gets together: Not on file    Attends religious service: Not on file    Active member of club or organization: Not on file    Attends meetings of clubs or organizations: Not on file    Relationship status: Not on file  Other Topics Concern  . Not on file  Social History Narrative  . Not on file   Additional Social History:                         Sleep: Fair  Appetite:  Fair  Current Medications: Current Facility-Administered Medications  Medication Dose Route Frequency Provider Last Rate Last Dose  . acetaminophen (TYLENOL) tablet 650 mg  650 mg Oral Q6H PRN Caroline Sauger, NP   650 mg at 08/31/19 1558  . alum & mag hydroxide-simeth (MAALOX/MYLANTA) 200-200-20 MG/5ML suspension 30 mL  30 mL Oral Q4H PRN Caroline Sauger, NP      .  ARIPiprazole (ABILIFY) tablet 20 mg  20 mg Oral Daily Caroline Sauger, NP   20 mg at 09/03/19 0751  . divalproex (DEPAKOTE) DR tablet 500 mg  500 mg Oral Q12H Caroline Sauger, NP   500 mg at 09/03/19 0751  . hydrOXYzine (ATARAX/VISTARIL) tablet 25 mg  25 mg Oral TID PRN Caroline Sauger, NP   25 mg at 08/31/19 0808  . ibuprofen (ADVIL) tablet 600 mg  600 mg Oral Q6H PRN Caroline Sauger, NP      . lithium carbonate (ESKALITH) CR tablet 450 mg  450 mg Oral Q12H Caroline Sauger, NP   450 mg at 09/03/19 0751  . magnesium hydroxide (MILK OF MAGNESIA) suspension 30 mL  30 mL Oral Daily PRN Caroline Sauger, NP   30 mL at 09/03/19 0751  . nicotine (NICODERM CQ - dosed in mg/24 hours) patch 21 mg  21 mg Transdermal Daily Clapacs, [name], MD   21 mg at 09/03/19 0752  . Norgestimate-Ethinyl Estradiol Triphasic 0.18/0.215/0.25 MG-35 MCG tablet 1 tablet  1 tablet Oral Daily Caroline Sauger, NP        Lab Results: No results found for this or any previous visit (from the past 48 hour(s)).  Blood Alcohol level:  Lab Results  Component Value Date   ETH <10 08/30/2019   ETH <10 0000000    Metabolic Disorder Labs: Lab Results  Component Value Date   HGBA1C 5.0 11/21/2018   MPG 96.8 11/21/2018   MPG 93.93 04/13/2018   No results found for: PROLACTIN Lab Results  Component Value Date   CHOL 196 11/21/2018   TRIG 97 11/21/2018   HDL 53 11/21/2018   CHOLHDL 3.7 11/21/2018   VLDL 19 11/21/2018   LDLCALC 124 (H) 11/21/2018   LDLCALC 117 (H) 04/13/2018    Physical Findings: AIMS:  , ,  ,  ,    CIWA:    COWS:     Musculoskeletal: Strength & Muscle Tone: within normal limits Gait & Station: normal Patient leans: N/A  Psychiatric Specialty Exam: Physical Exam  Constitutional: She appears well-developed and well-nourished.  HENT:  Head: Normocephalic and atraumatic.  Eyes: Pupils are equal, round, and reactive to light. Conjunctivae are normal.  Neck:  Normal range of motion.  Cardiovascular: Normal heart sounds.  Respiratory: Effort normal.  GI: Soft.  Musculoskeletal: Normal range of motion.  Neurological: She is alert.  Skin: Skin is  warm and dry.  Psychiatric: Her affect is blunt. Her speech is delayed. She is slowed, withdrawn and actively hallucinating. Thought content is paranoid. Cognition and memory are impaired. She expresses impulsivity. She expresses no suicidal ideation.    Review of Systems  Constitutional: Negative.   HENT: Negative.   Eyes: Negative.   Respiratory: Negative.   Cardiovascular: Negative.   Gastrointestinal: Negative.   Musculoskeletal: Negative.   Skin: Negative.   Neurological: Negative.   Psychiatric/Behavioral: Positive for depression and hallucinations. Negative for substance abuse and suicidal ideas. The patient is nervous/anxious.     Blood pressure 99/74, pulse 75, temperature 97.7 F (36.5 C), temperature source Oral, resp. rate 18, height 5\' 1"  (1.549 m), weight 47.6 kg, last menstrual period 08/21/2019, SpO2 100 %.Body mass index is 19.84 kg/m.  General Appearance: Casual  Eye Contact:  Fair  Speech:  Blocked  Volume:  Decreased  Mood:  Dysphoric  Affect:  Congruent  Thought Process:  Goal Directed  Orientation:  Full (Time, Place, and Person)  Thought Content:  Logical  Suicidal Thoughts:  No  Homicidal Thoughts:  No  Memory:  Immediate;   Fair Recent;   Fair Remote;   Fair  Judgement:  Fair  Insight:  Fair  Psychomotor Activity:  Decreased  Concentration:  Concentration: Fair  Recall:  [employer] of Knowledge:  Fair  Language:  Fair  Akathisia:  No  Handed:  Right  AIMS (if indicated):     Assets:  Desire for Improvement Physical Health  ADL's:  Impaired  Cognition:  Impaired,  Mild  Sleep:  Number of Hours: 7     Treatment Plan Summary: Daily contact with patient to assess and evaluate symptoms and progress in treatment, Medication management and Plan Continue  medication.  Check blood levels tomorrow.  Spent some time doing supportive counseling and encouraging the patient to get out of bed and be more active.  Alethia Berthold, MD 09/03/2019, 5:09 PM

## 2019-09-03 NOTE — Progress Notes (Signed)
Recreation Therapy Notes  Date: 09/03/2019  Time: 9:30 am   Location: Craft room   Behavioral response: N/A   Intervention Topic: Happiness  Discussion/Intervention: Patient did not attend group.   Clinical Observations/Feedback:  Patient did not attend group.   Ameena Vesey LRT/CTRS        Garo Heidelberg 09/03/2019 11:04 AM

## 2019-09-03 NOTE — Plan of Care (Signed)
D- Patient alert and oriented. Patient presents in a pleasant mood on assessment stating that she slept "good" last night and had no complaints to voice to this Probation officer. Patient denies any signs/symptoms of depression/anxiety. Patient also denies SI, HI, AVH, and pain at this time, stating "not right now". Patient's goal for today is "to try to attend groups", in which she will "work on anxiety myself" in order to achieve her goal.  A- Scheduled medications administered to patient, per MD orders. Support and encouragement provided.  Routine safety checks conducted every 15 minutes.  Patient informed to notify staff with problems or concerns.  R- No adverse drug reactions noted. Patient contracts for safety at this time. Patient compliant with medications and treatment plan. Patient receptive, calm, and cooperative. Patient interacts well with others on the unit.  Patient remains safe at this time.  Problem: Education: Goal: Knowledge of [facility] Education information/materials will improve Outcome: Not Progressing Goal: Emotional status will improve Outcome: Not Progressing Goal: Mental status will improve Outcome: Not Progressing Goal: Verbalization of understanding the information provided will improve Outcome: Not Progressing   Problem: Safety: Goal: Periods of time without injury will increase Outcome: Not Progressing   Problem: Education: Goal: Will be free of psychotic symptoms Outcome: Not Progressing Goal: Knowledge of the prescribed therapeutic regimen will improve Outcome: Not Progressing   Problem: Safety: Goal: Ability to redirect hostility and anger into socially appropriate behaviors will improve Outcome: Not Progressing Goal: Ability to remain free from injury will improve Outcome: Not Progressing

## 2019-09-03 NOTE — Plan of Care (Signed)
Patient improving stating she had a good day today and is feeling better  Problem: Education: Goal: Emotional status will improve Outcome: Progressing Goal: Mental status will improve Outcome: Progressing

## 2019-09-03 NOTE — Plan of Care (Signed)
Patient denies SI/HI/AVH. Patient is bizarre and mildly confused. Patient becomes agitated easily. The patient did come out of her room for snack time. Patient's safety is maintained on unit. Patient is adherent with scheduled medications. Patient is asleep.   Problem: Education: Goal: Knowledge of [facility] Education information/materials will improve Outcome: Not Progressing Goal: Emotional status will improve Outcome: Not Progressing Goal: Mental status will improve Outcome: Not Progressing

## 2019-09-03 NOTE — [facility] Group Notes (Signed)

## 2019-09-04 DIAGNOSIS — F3112 Bipolar disorder, current episode manic without psychotic features, moderate: Secondary | ICD-10-CM | POA: Diagnosis not present

## 2019-09-04 LAB — LITHIUM LEVEL: Lithium Lvl: 0.94 mmol/L (ref 0.60–1.20)

## 2019-09-04 LAB — VALPROIC ACID LEVEL: Valproic Acid Lvl: 97 ug/mL (ref 50.0–100.0)

## 2019-09-04 NOTE — Progress Notes (Signed)
[facility] MD Progress Note  09/04/2019 3:40 PM Autumn [name]  MRN:  [id number] Subjective: Patient seen chart reviewed.  Depakote level and lithium level were both drawn this morning and are both in the therapeutic range.  Patient has barely been out of bed today.  Said that she could not attend groups because she was too afraid.  Still having hallucinations part of the day.  Denies any active suicidal intent.  She is able to hold a lucid conversation and has some insight into the need for treatment.  She has been eating adequately.  No physical complaints of side effects of medicine Principal Problem: Bipolar 1 disorder, manic, moderate (HCC) Diagnosis: Principal Problem:   Bipolar 1 disorder, manic, moderate (HCC) Active Problems:   Alcohol abuse  Total Time spent with patient: 30 minutes  Past Psychiatric History: Past history of recurrent episodes of psychosis exacerbated by noncompliance  Past Medical History:  Past Medical History:  Diagnosis Date  . Allergy    Seasonal, Ultram (seizures)  . Anemia 2005  . Bipolar 1 disorder ([place])   . Glaucoma   . Seizures ([place]) 2008    Past Surgical History:  Procedure Laterality Date  . BREAST BIOPSY Right    x 2  . CESAREAN SECTION     x 2  . LEEP  2000   Family History:  Family History  Problem Relation Age of Onset  . Hypertension Mother   . Hyperlipidemia Mother   . Asthma Mother   . Parkinson's disease Father   . Lung cancer Maternal Grandmother   . Other Paternal Grandfather 80       Cardiac arrest  . Heart disease Paternal Grandfather    Family Psychiatric  History: See previous Social History:  Social History   Substance and Sexual Activity  Alcohol Use Yes  . Alcohol/week: 4.0 standard drinks  . Types: 4 Cans of beer per week   Comment: Ocassional     Social History   Substance and Sexual Activity  Drug Use Yes  . Types: Marijuana   Comment: percocet -last use over 1 week ago. Heroin-last used 2/11     Social History   Socioeconomic History  . Marital status: Single    Spouse name: Not on file  . Number of children: Not on file  . Years of education: Not on file  . Highest education level: Not on file  Occupational History  . Not on file  Social Needs  . Financial resource strain: Not on file  . Food insecurity    Worry: Not on file    Inability: Not on file  . Transportation needs    Medical: Not on file    Non-medical: Not on file  Tobacco Use  . Smoking status: Current Every Day Smoker    Packs/day: 1.00    Years: 9.00    Pack years: 9.00  . Smokeless tobacco: Never Used  . Tobacco comment: Patient not interested  Substance and Sexual Activity  . Alcohol use: Yes    Alcohol/week: 4.0 standard drinks    Types: 4 Cans of beer per week    Comment: Ocassional  . Drug use: Yes    Types: Marijuana    Comment: percocet -last use over 1 week ago. Heroin-last used 2/11  . Sexual activity: Yes    Birth control/protection: None  Lifestyle  . Physical activity    Days per week: Not on file    Minutes per session: Not on file  .  Stress: Not on file  Relationships  . Social Herbalist on phone: Not on file    Gets together: Not on file    Attends religious service: Not on file    Active member of club or organization: Not on file    Attends meetings of clubs or organizations: Not on file    Relationship status: Not on file  Other Topics Concern  . Not on file  Social History Narrative  . Not on file   Additional Social History:                         Sleep: Fair  Appetite:  Fair  Current Medications: Current Facility-Administered Medications  Medication Dose Route Frequency Provider Last Rate Last Dose  . acetaminophen (TYLENOL) tablet 650 mg  650 mg Oral Q6H PRN Caroline Sauger, NP   650 mg at 08/31/19 1558  . alum & mag hydroxide-simeth (MAALOX/MYLANTA) 200-200-20 MG/5ML suspension 30 mL  30 mL Oral Q4H PRN Caroline Sauger, NP       . ARIPiprazole (ABILIFY) tablet 20 mg  20 mg Oral Daily Caroline Sauger, NP   20 mg at 09/04/19 0814  . divalproex (DEPAKOTE) DR tablet 500 mg  500 mg Oral Q12H Caroline Sauger, NP   500 mg at 09/04/19 [id number]  . hydrOXYzine (ATARAX/VISTARIL) tablet 25 mg  25 mg Oral TID PRN Caroline Sauger, NP   25 mg at 08/31/19 0808  . ibuprofen (ADVIL) tablet 600 mg  600 mg Oral Q6H PRN Caroline Sauger, NP      . lithium carbonate (ESKALITH) CR tablet 450 mg  450 mg Oral Q12H Caroline Sauger, NP   450 mg at 09/04/19 0813  . magnesium hydroxide (MILK OF MAGNESIA) suspension 30 mL  30 mL Oral Daily PRN Caroline Sauger, NP   30 mL at 09/03/19 0751  . nicotine (NICODERM CQ - dosed in mg/24 hours) patch 21 mg  21 mg Transdermal Daily Letti Towell, [name], MD   21 mg at 09/04/19 [id number]  . Norgestimate-Ethinyl Estradiol Triphasic 0.18/0.215/0.25 MG-35 MCG tablet 1 tablet  1 tablet Oral Daily Caroline Sauger, NP        Lab Results:  Results for orders placed or performed during the hospital encounter of 08/31/19 (from the past 48 hour(s))  Lithium level     Status: None   Collection Time: 09/04/19  9:05 AM  Result Value Ref Range   Lithium Lvl 0.94 0.60 - 1.20 mmol/L    Comment: Performed at [facility], [address] [place], [place] [address]  Valproic acid level     Status: None   Collection Time: 09/04/19  9:05 AM  Result Value Ref Range   Valproic Acid Lvl 97 50.0 - 100.0 ug/mL    Comment: Performed at [facility], [address] [place],  [address]    Blood Alcohol level:  Lab Results  Component Value Date   [facility] <10 08/30/2019   ETH <10 0000000    Metabolic Disorder Labs: Lab Results  Component Value Date   HGBA1C 5.0 11/21/2018   MPG 96.8 11/21/2018   MPG 93.93 04/13/2018   No results found for: PROLACTIN Lab Results  Component Value Date   CHOL 196 11/21/2018   TRIG 97 11/21/2018   HDL 53 11/21/2018   CHOLHDL 3.7  11/21/2018   VLDL 19 11/21/2018   LDLCALC 124 (H) 11/21/2018   LDLCALC 117 (H) 04/13/2018    Physical  Findings: AIMS:  , ,  ,  ,    CIWA:    COWS:     Musculoskeletal: Strength & Muscle Tone: within normal limits Gait & Station: normal Patient leans: N/A  Psychiatric Specialty Exam: Physical Exam  Nursing note and vitals reviewed. Constitutional: She appears well-developed and well-nourished.  HENT:  Head: Normocephalic and atraumatic.  Eyes: Pupils are equal, round, and reactive to light. Conjunctivae are normal.  Neck: Normal range of motion.  Cardiovascular: Regular rhythm and normal heart sounds.  Respiratory: Effort normal. No respiratory distress.  GI: Soft.  Musculoskeletal: Normal range of motion.  Neurological: She is alert.  Skin: Skin is warm and dry.  Psychiatric: Her affect is blunt. Her speech is delayed. She is slowed and actively hallucinating. Thought content is paranoid. Cognition and memory are impaired. She expresses inappropriate judgment. She expresses no homicidal and no suicidal ideation.    Review of Systems  Constitutional: Negative.   HENT: Negative.   Eyes: Negative.   Respiratory: Negative.   Cardiovascular: Negative.   Gastrointestinal: Negative.   Musculoskeletal: Negative.   Skin: Negative.   Neurological: Negative.   Psychiatric/Behavioral: Positive for depression and hallucinations. Negative for substance abuse and suicidal ideas. The patient is nervous/anxious and has insomnia.     Blood pressure 97/65, pulse 74, temperature 97.7 F (36.5 C), temperature source Oral, resp. rate 18, height 5\' 1"  (1.549 m), weight 47.6 kg, last menstrual period 08/21/2019, SpO2 100 %.Body mass index is 19.84 kg/m.  General Appearance: Casual  Eye Contact:  Fair  Speech:  Slow  Volume:  Decreased  Mood:  Depressed and Dysphoric  Affect:  Constricted  Thought Process:  Coherent  Orientation:  Full (Time, Place, and Person)  Thought Content:   Hallucinations: Auditory and Paranoid Ideation  Suicidal Thoughts:  No  Homicidal Thoughts:  No  Memory:  Immediate;   Fair Recent;   Poor Remote;   Fair  Judgement:  Impaired  Insight:  Shallow  Psychomotor Activity:  Decreased  Concentration:  Concentration: Poor  Recall:  Poor  Fund of Knowledge:  Poor  Language:  Fair  Akathisia:  No  Handed:  Right  AIMS (if indicated):     Assets:  Desire for Improvement  ADL's:  Impaired  Cognition:  Impaired,  Mild  Sleep:  Number of Hours: 8     Treatment Plan Summary: Daily contact with patient to assess and evaluate symptoms and progress in treatment, Medication management and Plan Lithium and Depakote both in the therapeutic range.  No sign of any side effects.  Encourage patient to get up out of bed and interact in groups more.  Patient stated she would not did get up out of bed to go outdoors.  No change to observation level no change to medicine for now.  Alethia Berthold, MD 09/04/2019, 3:40 PM

## 2019-09-04 NOTE — [facility] Group Notes (Signed)
  LCSW Group Therapy Note  09/04/2019 10:52 AM   Type of Therapy/Topic:  Group Therapy:  Feelings about Diagnosis  Participation Level:  Did Not Attend   Description of Group:   This group will allow patients to explore their thoughts and feelings about diagnoses they have received. Patients will be guided to explore their level of understanding and acceptance of these diagnoses. Facilitator will encourage patients to process their thoughts and feelings about the reactions of others to their diagnosis and will guide patients in identifying ways to discuss their diagnosis with significant others in their lives. This group will be process-oriented, with patients participating in exploration of their own experiences, giving and receiving support, and processing challenge from other group members.   Therapeutic Goals: 1. Patient will demonstrate understanding of diagnosis as evidenced by identifying two or more symptoms of the disorder 2. Patient will be able to express two feelings regarding the diagnosis 3. Patient will demonstrate their ability to communicate their needs through discussion and/or role play  Summary of Patient Progress: x   Therapeutic Modalities:   Cognitive Behavioral Therapy Brief Therapy Feelings Identification    Evalina Field, MSW, LCSW Clinical Social Work 09/04/2019 10:52 AM

## 2019-09-04 NOTE — Plan of Care (Signed)
Patient knowledgeable of information   received  regarding unit programing  able  to verbalize understanding . Patient attending unit  therapy groups able to identify lifestyle changes  .  Aware of medication received Mental and emotional status improving. Patient working on coping , decision making and anxiety.  Voice no concerns around sleep.  No anger outburst , able to control behavior.  Problem: Safety: Goal: Ability to redirect hostility and anger into socially appropriate behaviors will improve Outcome: Progressing Goal: Ability to remain free from injury will improve Outcome: Progressing   Problem: Education: Goal: Will be free of psychotic symptoms Outcome: Progressing Goal: Knowledge of the prescribed therapeutic regimen will improve Outcome: Progressing   Problem: Education: Goal: Knowledge of [facility] Education information/materials will improve Outcome: Progressing Goal: Emotional status will improve Outcome: Progressing Goal: Mental status will improve Outcome: Progressing Goal: Verbalization of understanding the information provided will improve Outcome: Progressing

## 2019-09-04 NOTE — Progress Notes (Signed)
Recreation Therapy Notes  Date: 09/04/2019  Time: 9:30 am   Location: Craft room   Behavioral response: N/A   Intervention Topic: Goals  Discussion/Intervention: Patient did not attend group.   Clinical Observations/Feedback:  Patient did not attend group.   Kanae Ignatowski LRT/CTRS        Yared Barefoot 09/04/2019 11:04 AM

## 2019-09-04 NOTE — Progress Notes (Signed)
[facility] MD Progress Note  09/04/2019 10:08 AM Autumn [name]  MRN:  [id number] Subjective: I am tired. I just got my blood drawn and I am tired.   Objective: Patient seen and case assessed. Patient is observed lying in bed during group time. She appears to be guarded and restricted with her presentation, and doesn't appear to be vested in her treatment. She has not been attending groups, and despite many prompts to engage and participate in groups and therapeutic milieu she continues to shuffle in bed and get comfortable.  During this evaluation patient appears grudgingly cooperative and irritable. She is observed with eye rolling and poor eye contact. She is not vested in her treatment here, and is ok with doing the bare minimum in order to discharge. She is encouraged to utilize and take advantage of her treatment in the hospital to reduce any further symptoms of depression and or suicide attempts.  She is unable to identify any readiness for change  She denies any psychiatric symptoms or homicidal thoughts. She remains on ABilify 20mg  po daily for depression, Depakote 500mg  po Q12, and Lithium 450mg  po Q12  which she is tolerating well at this time.  Her Lithium levels and Depakote levels are within therapeutic range. Despite initial concerns and original presentation she has not displayed any psychotic or bizarre behaviors while on the unit.  She denies any suicidal ideations and is able to contract for safety while on the unit. She denies any hallucinations at this time.   Principal Problem: Bipolar 1 disorder, manic, moderate (HCC) Diagnosis: Principal Problem:   Bipolar 1 disorder, manic, moderate (HCC) Active Problems:   Alcohol abuse  Total Time spent with patient: 30 minutes  Past Psychiatric History: Past history of recurrent episodes of bipolar disorder with mixed and depressive episodes  Past Medical History:  Past Medical History:  Diagnosis Date  . Allergy    Seasonal, Ultram  (seizures)  . Anemia 2005  . Bipolar 1 disorder ([place])   . Glaucoma   . Seizures ([place]) 2008    Past Surgical History:  Procedure Laterality Date  . BREAST BIOPSY Right    x 2  . CESAREAN SECTION     x 2  . LEEP  2000   Family History:  Family History  Problem Relation Age of Onset  . Hypertension Mother   . Hyperlipidemia Mother   . Asthma Mother   . Parkinson's disease Father   . Lung cancer Maternal Grandmother   . Other Paternal Grandfather 27       Cardiac arrest  . Heart disease Paternal Grandfather    Family Psychiatric  History: See previous Social History:  Social History   Substance and Sexual Activity  Alcohol Use Yes  . Alcohol/week: 4.0 standard drinks  . Types: 4 Cans of beer per week   Comment: Ocassional     Social History   Substance and Sexual Activity  Drug Use Yes  . Types: Marijuana   Comment: percocet -last use over 1 week ago. Heroin-last used 2/11    Social History   Socioeconomic History  . Marital status: Single    Spouse name: Not on file  . Number of children: Not on file  . Years of education: Not on file  . Highest education level: Not on file  Occupational History  . Not on file  Social Needs  . Financial resource strain: Not on file  . Food insecurity    Worry: Not on file  Inability: Not on file  . Transportation needs    Medical: Not on file    Non-medical: Not on file  Tobacco Use  . Smoking status: Current Every Day Smoker    Packs/day: 1.00    Years: 9.00    Pack years: 9.00  . Smokeless tobacco: Never Used  . Tobacco comment: Patient not interested  Substance and Sexual Activity  . Alcohol use: Yes    Alcohol/week: 4.0 standard drinks    Types: 4 Cans of beer per week    Comment: Ocassional  . Drug use: Yes    Types: Marijuana    Comment: percocet -last use over 1 week ago. Heroin-last used 2/11  . Sexual activity: Yes    Birth control/protection: None  Lifestyle  . Physical activity    Days per  week: Not on file    Minutes per session: Not on file  . Stress: Not on file  Relationships  . Social Herbalist on phone: Not on file    Gets together: Not on file    Attends religious service: Not on file    Active member of club or organization: Not on file    Attends meetings of clubs or organizations: Not on file    Relationship status: Not on file  Other Topics Concern  . Not on file  Social History Narrative  . Not on file   Additional Social History:    Sleep: Fair  Appetite:  Fair  Current Medications: Current Facility-Administered Medications  Medication Dose Route Frequency Provider Last Rate Last Dose  . acetaminophen (TYLENOL) tablet 650 mg  650 mg Oral Q6H PRN Caroline Sauger, NP   650 mg at 08/31/19 1558  . alum & mag hydroxide-simeth (MAALOX/MYLANTA) 200-200-20 MG/5ML suspension 30 mL  30 mL Oral Q4H PRN Caroline Sauger, NP      . ARIPiprazole (ABILIFY) tablet 20 mg  20 mg Oral Daily Caroline Sauger, NP   20 mg at 09/04/19 0814  . divalproex (DEPAKOTE) DR tablet 500 mg  500 mg Oral Q12H Caroline Sauger, NP   500 mg at 09/04/19 [id number]  . hydrOXYzine (ATARAX/VISTARIL) tablet 25 mg  25 mg Oral TID PRN Caroline Sauger, NP   25 mg at 08/31/19 0808  . ibuprofen (ADVIL) tablet 600 mg  600 mg Oral Q6H PRN Caroline Sauger, NP      . lithium carbonate (ESKALITH) CR tablet 450 mg  450 mg Oral Q12H Caroline Sauger, NP   450 mg at 09/04/19 0813  . magnesium hydroxide (MILK OF MAGNESIA) suspension 30 mL  30 mL Oral Daily PRN Caroline Sauger, NP   30 mL at 09/03/19 0751  . nicotine (NICODERM CQ - dosed in mg/24 hours) patch 21 mg  21 mg Transdermal Daily Clapacs, [name], MD   21 mg at 09/04/19 [id number]  . Norgestimate-Ethinyl Estradiol Triphasic 0.18/0.215/0.25 MG-35 MCG tablet 1 tablet  1 tablet Oral Daily Caroline Sauger, NP        Lab Results:  Results for orders placed or performed during the hospital encounter of 08/31/19  (from the past 48 hour(s))  Lithium level     Status: None   Collection Time: 09/04/19  9:05 AM  Result Value Ref Range   Lithium Lvl 0.94 0.60 - 1.20 mmol/L    Comment: Performed at [facility], [address], [place], [place] [address]  Valproic acid level     Status: None   Collection Time: 09/04/19  9:05 AM  Result Value Ref Range   Valproic Acid Lvl 97 50.0 - 100.0 ug/mL    Comment: Performed at [facility], [address] [place], [place] [address]    Blood Alcohol level:  Lab Results  Component Value Date   [facility] <10 08/30/2019   ETH <10 0000000    Metabolic Disorder Labs: Lab Results  Component Value Date   HGBA1C 5.0 11/21/2018   MPG 96.8 11/21/2018   MPG 93.93 04/13/2018   No results found for: PROLACTIN Lab Results  Component Value Date   CHOL 196 11/21/2018   TRIG 97 11/21/2018   HDL 53 11/21/2018   CHOLHDL 3.7 11/21/2018   VLDL 19 11/21/2018   LDLCALC 124 (H) 11/21/2018   LDLCALC 117 (H) 04/13/2018    Musculoskeletal: Strength & Muscle Tone: within normal limits Gait & Station: normal Patient leans: N/A  Psychiatric Specialty Exam: Physical Exam  Constitutional: She appears well-developed and well-nourished.  HENT:  Head: Normocephalic and atraumatic.  Eyes: Pupils are equal, round, and reactive to light. Conjunctivae are normal.  Neck: Normal range of motion.  Cardiovascular: Normal heart sounds.  Respiratory: Effort normal.  GI: Soft.  Musculoskeletal: Normal range of motion.  Neurological: She is alert.  Skin: Skin is warm and dry.  Psychiatric: Her affect is blunt. Her speech is delayed. She is slowed, withdrawn and actively hallucinating. Thought content is paranoid. Cognition and memory are impaired. She expresses impulsivity. She expresses no suicidal ideation.    Review of Systems  Constitutional: Negative.   HENT: Negative.   Eyes: Negative.   Respiratory: Negative.   Cardiovascular: Negative.    Gastrointestinal: Negative.   Musculoskeletal: Negative.   Skin: Negative.   Neurological: Negative.   Psychiatric/Behavioral: Positive for depression and hallucinations. Negative for substance abuse and suicidal ideas. The patient is nervous/anxious.     Blood pressure 97/65, pulse 74, temperature 97.7 F (36.5 C), temperature source Oral, resp. rate 18, height 5\' 1"  (1.549 m), weight 47.6 kg, last menstrual period 08/21/2019, SpO2 100 %.Body mass index is 19.84 kg/m.  General Appearance: Disheveled  Eye Contact:  Poor  Speech:  Clear and Coherent  Volume:  Normal  Mood:  Irritable  Affect:  Restricted  Thought Process:  Linear and Descriptions of Associations: Intact  Orientation:  Other:  A&O x3  Thought Content:  WDL  Suicidal Thoughts:  Denies  Homicidal Thoughts:  Denies  Memory:  Immediate;   Good Recent;   Poor Remote;   Poor  Judgement:  Poor  Insight:  Lacking  Psychomotor Activity:  Psychomotor Retardation  Concentration:  Concentration: Good  Recall:  Negative  Fund of Knowledge:  Poor  Language:  Good  Akathisia:  Negative  Handed:  Right  AIMS (if indicated):     Assets:  Desire for Improvement Physical Health  ADL's:  Intact  Cognition:  Impaired,  Mild  Sleep:  Number of Hours: 8     Treatment Plan Summary: Daily contact with patient to assess and evaluate symptoms and progress in treatment and Medication management. As noted patient continues to remain restricted to her room and bed despite prompts, encouragement and engagement by staff. Patient does not appear to be vested in treatment here at the hospital, as her resistance to attend groups and denying of depressive symptoms. Patient states she is waiting to go to [facility] after discharge. Continue current medications as directed   Suella Broad, FNP 09/04/2019, 10:08 AM

## 2019-09-04 NOTE — Progress Notes (Signed)
D: Bipolar   A: Patient stated slept poor last night .Stated appetite  good and energy level   normal. Stated concentration is good . Stated on Depression scale  1, hopeless and anxiety .( low 0-10 high) Denies suicidal  homicidal ideations  .  No auditory hallucinations  No pain concerns . Appropriate ADL'S. Interacting with peers and staff. Patient knowledgeable of information   received  regarding unit programing  able  to verbalize understanding . Patient attending unit  therapy groups able to identify lifestyle changes  .  Aware of medication received Mental and emotional status improving. Patient working on coping , decision making and anxiety.  Voice no concerns around sleep.  No anger outburst , able to control behavior. Encourage patient participation with unit programming . Instruction  Given on  Medication , verbalize understanding.  R: Voice no other concerns. Staff continue to monitor

## 2019-09-04 NOTE — Progress Notes (Addendum)
CSW called [facility] who reported they have no beds available currently. There is a 4-6 week wait at this time.  CSW called [facility] to see if they have any female openings. CSW left a voicemail requesting a call back.   Evalina Field, MSW, LCSW Clinical Social Work 09/04/2019 2:52 PM

## 2019-09-05 DIAGNOSIS — F1010 Alcohol abuse, uncomplicated: Secondary | ICD-10-CM | POA: Diagnosis not present

## 2019-09-05 DIAGNOSIS — F3112 Bipolar disorder, current episode manic without psychotic features, moderate: Secondary | ICD-10-CM | POA: Diagnosis not present

## 2019-09-05 MED ORDER — ENSURE ENLIVE PO LIQD
237.0000 mL | Freq: Two times a day (BID) | ORAL | Status: DC
  Administered 2019-09-05 – 2019-09-10 (×9): 237 mL via ORAL

## 2019-09-05 MED ORDER — TRAZODONE HCL 50 MG PO TABS
50.0000 mg | ORAL_TABLET | Freq: Every evening | ORAL | Status: DC | PRN
  Administered 2019-09-05 – 2019-09-06 (×3): 50 mg via ORAL
  Filled 2019-09-05 (×2): qty 1

## 2019-09-05 NOTE — Progress Notes (Signed)
Pt was encouraged to get out of the bed and do some activities as in group. Gave pt gatorade and encouraged her to drink supplements and eat meals. Collier Bullock RN

## 2019-09-05 NOTE — Progress Notes (Signed)
Recreation Therapy Notes    Date: 09/05/2019  Time: 9:30 am   Location: Craft room   Behavioral response: N/A   Intervention Topic: Coping skills  Discussion/Intervention: Patient did not attend group.   Clinical Observations/Feedback:  Patient did not attend group.   Brainard Highfill LRT/CTRS        Kyelle Urbas 09/05/2019 10:51 AM

## 2019-09-05 NOTE — BHH Group Notes (Signed)
LCSW Group Therapy Note  09/05/2019 1:00 PM  Type of Therapy/Topic:  Group Therapy:  Emotion Regulation  Participation Level:  Active   Description of Group:   The purpose of this group is to assist patients in learning to regulate negative emotions and experience positive emotions. Patients will be guided to discuss ways in which they have been vulnerable to their negative emotions. These vulnerabilities will be juxtaposed with experiences of positive emotions or situations, and patients will be challenged to use positive emotions to combat negative ones. Special emphasis will be placed on coping with negative emotions in conflict situations, and patients will process healthy conflict resolution skills.  Therapeutic Goals: 1. Patient will identify two positive emotions or experiences to reflect on in order to balance out negative emotions 2. Patient will label two or more emotions that they find the most difficult to experience 3. Patient will demonstrate positive conflict resolution skills through discussion and/or role plays  Summary of Patient Progress: Patient was present in group and active in group discussions.  Patient shared that she has felt frightened and that has led to her most recent hospitalization.  She reports that she has been around others who have made assumptions and that has had a negative impact on her.    Therapeutic Modalities:   Cognitive Behavioral Therapy Feelings Identification Dialectical Behavioral Therapy  Assunta Curtis, MSW, LCSW 09/05/2019 10:32 AM

## 2019-09-05 NOTE — Progress Notes (Signed)
Patient alert and oriented x 4, affect is flat but she brightens upon approach , no distress noted interacting appropriately with peers and staff, thoughts are organized and coherent, compliant with medication regimen no distress noted. 15 minutes safety checks mantained.

## 2019-09-05 NOTE — BHH Counselor (Signed)
CSW received message from [place] at [place]. She reports that patient is declined due to needing a higher level of care and report of active hallucinations and that patient is a danger to self and others.   Assunta Curtis, MSW, LCSW 09/05/2019 8:23 AM

## 2019-09-05 NOTE — Progress Notes (Signed)
[facility] MD Progress Note  09/05/2019 10:07 AM Autumn [name]  MRN:  [id number]   Subjective:  Patient is a 43 year-old female and is well-known to our service with chronic psychotic symptoms chronic noncompliance with treatment.  She evidently was picked up by police after they were called because she was ringing people's doorbells in the middle of the night and acting bizarrely.  Patient says that she was engaging in "self injurious behavior". Patient reports today that she is feeling better.  She denies any suicidal or homicidal ideations and denies any hallucinations.  Patient's only complaint is that she has not been sleeping well even though it is been documented that she was sleep for 8 hours last night.  She states that she just lies in the bed and is not sleeping.  She also reports that her appetite is not been very well and would like to have some Ensure.  She states she does not know how much weight that she has lost but she is feels that she has lost a significant amount.  She does report discussing treatment at [facility] with Lanae Boast the [place] liaison.  She does report recently using crack cocaine and marijuana.  Principal Problem: Bipolar 1 disorder, manic, moderate (HCC) Diagnosis: Principal Problem:   Bipolar 1 disorder, manic, moderate (HCC) Active Problems:   Alcohol abuse  Total Time spent with patient: 30 minutes  Past Psychiatric History: Patient has a history of bipolar disorder with frequent presentations to the hospital psychotic sometimes agitated sometimes depressed.  Has made suicidal threats in the past.  Has had substance abuse problems occasionally in the past but most recently seems to have not been having much substance abuse issue included.  Frequently noncompliant with medication.  Last time she was here she was supposedly living with her boyfriend and I think that is where she was going back.  She tells me that person is no longer in her life.  She is a extremely poor  historian at this point.  Past Medical History:  Past Medical History:  Diagnosis Date  . Allergy    Seasonal, Ultram (seizures)  . Anemia 2005  . Bipolar 1 disorder ([place])   . Glaucoma   . Seizures ([place]) 2008    Past Surgical History:  Procedure Laterality Date  . BREAST BIOPSY Right    x 2  . CESAREAN SECTION     x 2  . LEEP  2000   Family History:  Family History  Problem Relation Age of Onset  . Hypertension Mother   . Hyperlipidemia Mother   . Asthma Mother   . Parkinson's disease Father   . Lung cancer Maternal Grandmother   . Other Paternal Grandfather 20       Cardiac arrest  . Heart disease Paternal Grandfather    Family Psychiatric  History: None known Social History:  Social History   Substance and Sexual Activity  Alcohol Use Yes  . Alcohol/week: 4.0 standard drinks  . Types: 4 Cans of beer per week   Comment: Ocassional     Social History   Substance and Sexual Activity  Drug Use Yes  . Types: Marijuana   Comment: percocet -last use over 1 week ago. Heroin-last used 2/11    Social History   Socioeconomic History  . Marital status: Single    Spouse name: Not on file  . Number of children: Not on file  . Years of education: Not on file  . Highest education level: Not  on file  Occupational History  . Not on file  Social Needs  . Financial resource strain: Not on file  . Food insecurity    Worry: Not on file    Inability: Not on file  . Transportation needs    Medical: Not on file    Non-medical: Not on file  Tobacco Use  . Smoking status: Current Every Day Smoker    Packs/day: 1.00    Years: 9.00    Pack years: 9.00  . Smokeless tobacco: Never Used  . Tobacco comment: Patient not interested  Substance and Sexual Activity  . Alcohol use: Yes    Alcohol/week: 4.0 standard drinks    Types: 4 Cans of beer per week    Comment: Ocassional  . Drug use: Yes    Types: Marijuana    Comment: percocet -last use over 1 week ago. Heroin-last  used 2/11  . Sexual activity: Yes    Birth control/protection: None  Lifestyle  . Physical activity    Days per week: Not on file    Minutes per session: Not on file  . Stress: Not on file  Relationships  . Social Herbalist on phone: Not on file    Gets together: Not on file    Attends religious service: Not on file    Active member of club or organization: Not on file    Attends meetings of clubs or organizations: Not on file    Relationship status: Not on file  Other Topics Concern  . Not on file  Social History Narrative  . Not on file   Additional Social History:                         Sleep: Fair  Appetite:  Good  Current Medications: Current Facility-Administered Medications  Medication Dose Route Frequency Provider Last Rate Last Dose  . acetaminophen (TYLENOL) tablet 650 mg  650 mg Oral Q6H PRN Caroline Sauger, NP   650 mg at 08/31/19 1558  . alum & mag hydroxide-simeth (MAALOX/MYLANTA) 200-200-20 MG/5ML suspension 30 mL  30 mL Oral Q4H PRN Caroline Sauger, NP      . ARIPiprazole (ABILIFY) tablet 20 mg  20 mg Oral Daily Caroline Sauger, NP   20 mg at 09/05/19 0756  . divalproex (DEPAKOTE) DR tablet 500 mg  500 mg Oral Q12H Caroline Sauger, NP   500 mg at 09/05/19 0756  . hydrOXYzine (ATARAX/VISTARIL) tablet 25 mg  25 mg Oral TID PRN Caroline Sauger, NP   25 mg at 08/31/19 0808  . ibuprofen (ADVIL) tablet 600 mg  600 mg Oral Q6H PRN Caroline Sauger, NP      . lithium carbonate (ESKALITH) CR tablet 450 mg  450 mg Oral Q12H Caroline Sauger, NP   450 mg at 09/05/19 0756  . magnesium hydroxide (MILK OF MAGNESIA) suspension 30 mL  30 mL Oral Daily PRN Caroline Sauger, NP   30 mL at 09/03/19 0751  . nicotine (NICODERM CQ - dosed in mg/24 hours) patch 21 mg  21 mg Transdermal Daily Clapacs, [name], MD   21 mg at 09/05/19 0759  . Norgestimate-Ethinyl Estradiol Triphasic 0.18/0.215/0.25 MG-35 MCG tablet 1 tablet  1  tablet Oral Daily Caroline Sauger, NP        Lab Results:  Results for orders placed or performed during the hospital encounter of 08/31/19 (from the past 48 hour(s))  Lithium level     Status: None  Collection Time: 09/04/19  9:05 AM  Result Value Ref Range   Lithium Lvl 0.94 0.60 - 1.20 mmol/L    Comment: Performed at [facility], [address] [place], [place] [address]  Valproic acid level     Status: None   Collection Time: 09/04/19  9:05 AM  Result Value Ref Range   Valproic Acid Lvl 97 50.0 - 100.0 ug/mL    Comment: Performed at [facility], [address] [place], [place] [address]    Blood Alcohol level:  Lab Results  Component Value Date   [facility] <10 08/30/2019   ETH <10 0000000    Metabolic Disorder Labs: Lab Results  Component Value Date   HGBA1C 5.0 11/21/2018   MPG 96.8 11/21/2018   MPG 93.93 04/13/2018   No results found for: PROLACTIN Lab Results  Component Value Date   CHOL 196 11/21/2018   TRIG 97 11/21/2018   HDL 53 11/21/2018   CHOLHDL 3.7 11/21/2018   VLDL 19 11/21/2018   LDLCALC 124 (H) 11/21/2018   LDLCALC 117 (H) 04/13/2018    Physical Findings: AIMS:  , ,  ,  ,    CIWA:    COWS:     Musculoskeletal: Strength & Muscle Tone: within normal limits Gait & Station: normal Patient leans: N/A  Psychiatric Specialty Exam: Physical Exam  Nursing note and vitals reviewed. Constitutional: She is oriented to person, place, and time. She appears well-developed and well-nourished.  Cardiovascular: Normal rate.  Respiratory: Effort normal.  Musculoskeletal: Normal range of motion.  Neurological: She is alert and oriented to person, place, and time.  Skin: Skin is warm.    Review of Systems  Constitutional: Negative.   HENT: Negative.   Eyes: Negative.   Respiratory: Negative.   Cardiovascular: Negative.   Gastrointestinal: Negative.   Genitourinary: Negative.   Musculoskeletal: Negative.   Skin: Negative.    Neurological: Negative.   Endo/Heme/Allergies: Negative.   Psychiatric/Behavioral: Positive for depression and substance abuse. Negative for hallucinations and suicidal ideas. The patient is nervous/anxious and has insomnia.     Blood pressure (!) 86/65, pulse 71, temperature 97.7 F (36.5 C), temperature source Oral, resp. rate 17, height 5\' 1"  (1.549 m), weight 47.6 kg, last menstrual period 08/21/2019, SpO2 100 %.Body mass index is 19.84 kg/m.  General Appearance: Disheveled  Eye Contact:  Good  Speech:  Clear and Coherent and Normal Rate  Volume:  Decreased  Mood:  Depressed  Affect:  Congruent  Thought Process:  Coherent and Descriptions of Associations: Intact  Orientation:  Full (Time, Place, and Person)  Thought Content:  WDL  Suicidal Thoughts:  No  Homicidal Thoughts:  No  Memory:  Immediate;   Good Recent;   Good Remote;   Good  Judgement:  Fair  Insight:  Fair  Psychomotor Activity:  Normal  Concentration:  Concentration: Good  Recall:  Good  Fund of Knowledge:  Good  Language:  Good  Akathisia:  No  Handed:  Right  AIMS (if indicated):     Assets:  Communication Skills Desire for Improvement Housing Resilience Social Support Transportation  ADL's:  Intact  Cognition:  WNL  Sleep:  Number of Hours: 8   Assessment: Patient presents lying in the bed and is pleasant, calm, cooperative.  Patient is still appears to be somewhat disheveled with a flat affect.  She is still remained in the bed this morning but is awake.  She is not attending group this morning.  Discussed with the  patient about getting out of her room more and interacting with peers and attending groups.  Discussed with patient about her sleep and agreed to start trazodone to assist with sleep and encourage the patient to get out of bed more during the day and attend groups.  Also agreed to start patient on Ensure twice a day in between meals to assist with nourishment.  Patient states that her other  medications have been doing well and feels that she is stabilizing.  Current lithium level is 0.94 and current valproic acid level is 97.  Vital signs are within normal limits except for blood pressure was last read as 86/65.  We will have staff rechecked patient's blood pressure  Treatment Plan Summary: Daily contact with patient to assess and evaluate symptoms and progress in treatment and Medication management  Continue Abilify 20 mg p.o. daily for bipolar 1 disorder Continue Depakote DR 500 mg p.o. every 12 hours for mood stability Continue Eskalith 450 mg p.o. every 12 hours for bipolar 1 disorder Start trazodone 50 mg p.o. nightly as needed for insomnia Start Ensure feeding supplement twice a day in between meals for nourishment Continue Vistaril 25 mg p.o. 3 times daily as needed for anxiety Encourage group therapy participation Continue every 15 minute safety checks  Lewis Shock, FNP 09/05/2019, 10:07 AM

## 2019-09-05 NOTE — Progress Notes (Signed)
Pt went to one group and said that she stayed. She also said that she did not have an appetite today and it is a challenge for her to eat in front of others because she gets nervous. She also says that she is "trying". She spoke with her mother on the phone. Collier Bullock RN

## 2019-09-05 NOTE — Plan of Care (Signed)
Pt denies depression, SI, HI and AVH. Pt rates anxiety 3/10. Pt was educated on care plan and verbalizes understanding. Pt was encouraged to attend groups. Collier Bullock RN Problem: Education: Goal: Knowledge of [place] [facility] Education information/materials will improve Outcome: Progressing Goal: Emotional status will improve Outcome: Progressing Goal: Mental status will improve Outcome: Progressing Goal: Verbalization of understanding the information provided will improve Outcome: Progressing   Problem: Safety: Goal: Periods of time without injury will increase Outcome: Progressing   Problem: Education: Goal: Will be free of psychotic symptoms Outcome: Progressing Goal: Knowledge of the prescribed therapeutic regimen will improve Outcome: Progressing   Problem: Safety: Goal: Ability to redirect hostility and anger into socially appropriate behaviors will improve Outcome: Progressing Goal: Ability to remain free from injury will improve Outcome: Progressing   Problem: Education: Goal: Will be free of psychotic symptoms Outcome: Progressing Goal: Knowledge of the prescribed therapeutic regimen will improve Outcome: Progressing

## 2019-09-05 NOTE — BHH Counselor (Signed)
CSW met with patient to inform that the patient was denied at [facility].  CSW reviewed [facility], [facility], [facility] and [facility] with the patient.  Patient declined all referrals as they were not local.    Patient declined inpatient and requested referral to [facility] or [facility].  CSW obtained consents for both.    Patient reports that she is homeless.  CSW discussed with patient that at last conversation [facility] in the are was full and that patient could be referred to [facility] or [facility].  Patient declined the shelters in [place] stating that they were not close enough.  Patient reports plans to call her boyfriend to see if she can return to his home.   CSW discussed with the patient the SA program at [facility] and the patient again declined.  Assunta Curtis, MSW, LCSW 09/05/2019 1:52 PM

## 2019-09-06 DIAGNOSIS — F3112 Bipolar disorder, current episode manic without psychotic features, moderate: Secondary | ICD-10-CM | POA: Diagnosis not present

## 2019-09-06 NOTE — Progress Notes (Signed)
Recreation Therapy Notes  Date: 09/06/2019  Time: 9:30 am   Location: Craft room   Behavioral response: N/A   Intervention Topic: Leisure   Discussion/Intervention: Patient did not attend group.   Clinical Observations/Feedback:  Patient did not attend group.   Gabby Rackers LRT/CTRS        Clifford Benninger 09/06/2019 11:19 AM

## 2019-09-06 NOTE — Progress Notes (Signed)
Patient alert and oriented x 4, affect is bright upon approach, she is interacting appropriately with peers and staff, no isolation, thoughts are organized and coherent, she appears less anxious rated depression a 4/10, compliant with medication regimen no distress noted. 15 minutes safety checks mantained

## 2019-09-06 NOTE — Progress Notes (Signed)
Pt has went to group today. She has been calm and cooperative. She says that she is feeling better. Collier Bullock RN

## 2019-09-06 NOTE — Tx Team (Signed)
Interdisciplinary Treatment and Diagnostic Plan Update  09/06/2019 Time of Session: 8:30am Autumn [name] MRN: [id number]  Principal Diagnosis: Bipolar 1 disorder, manic, moderate (HCC)  Secondary Diagnoses: Principal Problem:   Bipolar 1 disorder, manic, moderate (HCC) Active Problems:   Alcohol abuse   Current Medications:  Current Facility-Administered Medications  Medication Dose Route Frequency Provider Last Rate Last Dose  . acetaminophen (TYLENOL) tablet 650 mg  650 mg Oral Q6H PRN Caroline Sauger, NP   650 mg at 09/05/19 1428  . alum & mag hydroxide-simeth (MAALOX/MYLANTA) 200-200-20 MG/5ML suspension 30 mL  30 mL Oral Q4H PRN Caroline Sauger, NP      . ARIPiprazole (ABILIFY) tablet 20 mg  20 mg Oral Daily Caroline Sauger, NP   20 mg at 09/06/19 0807  . divalproex (DEPAKOTE) DR tablet 500 mg  500 mg Oral Q12H Caroline Sauger, NP   500 mg at 09/06/19 0807  . feeding supplement (ENSURE ENLIVE) (ENSURE ENLIVE) liquid 237 mL  237 mL Oral BID BM Money, Lowry Ram, FNP   237 mL at 09/05/19 1115  . hydrOXYzine (ATARAX/VISTARIL) tablet 25 mg  25 mg Oral TID PRN Caroline Sauger, NP   25 mg at 08/31/19 0808  . ibuprofen (ADVIL) tablet 600 mg  600 mg Oral Q6H PRN Caroline Sauger, NP      . lithium carbonate (ESKALITH) CR tablet 450 mg  450 mg Oral Q12H Caroline Sauger, NP   450 mg at 09/06/19 0807  . magnesium hydroxide (MILK OF MAGNESIA) suspension 30 mL  30 mL Oral Daily PRN Caroline Sauger, NP   30 mL at 09/03/19 0751  . nicotine (NICODERM CQ - dosed in mg/24 hours) patch 21 mg  21 mg Transdermal Daily Clapacs, [name], MD   21 mg at 09/06/19 0810  . Norgestimate-Ethinyl Estradiol Triphasic 0.18/0.215/0.25 MG-35 MCG tablet 1 tablet  1 tablet Oral Daily Caroline Sauger, NP      . traZODone (DESYREL) tablet 50 mg  50 mg Oral QHS,MR X 1 Money, Lowry Ram, FNP   50 mg at 09/05/19 2122   PTA Medications: Medications Prior to Admission   Medication Sig Dispense Refill Last Dose  . amoxicillin (AMOXIL) 500 MG capsule Take 1 capsule (500 mg total) by mouth 3 (three) times daily. (Patient not taking: Reported on 08/21/2019) 30 capsule 0   . ARIPiprazole (ABILIFY) 20 MG tablet Take 1 tablet (20 mg total) by mouth daily. (Patient not taking: Reported on 08/21/2019) 30 tablet 2   . divalproex (DEPAKOTE) 500 MG DR tablet Take 1 tablet (500 mg total) by mouth every 12 (twelve) hours. (Patient not taking: Reported on 08/21/2019) 60 tablet 2   . hydrOXYzine (ATARAX/VISTARIL) 25 MG tablet Take 1 tablet (25 mg total) by mouth 3 (three) times daily as needed for anxiety. (Patient not taking: Reported on 08/21/2019) 60 tablet 2   . lidocaine (XYLOCAINE) 2 % solution Use as directed 5 mLs in the mouth or throat every 6 (six) hours as needed for mouth pain. Oral swish (Patient not taking: Reported on 08/30/2019) 100 mL 0   . lithium carbonate (ESKALITH) 450 MG CR tablet Take 1 tablet (450 mg total) by mouth every 12 (twelve) hours. (Patient not taking: Reported on 08/21/2019) 60 tablet 2   . Norgestimate-Ethinyl Estradiol Triphasic (ORTHO TRI-CYCLEN, 28,) 0.18/0.215/0.25 MG-35 MCG tablet Take 1 tablet by mouth daily.     . traZODone (DESYREL) 100 MG tablet Take 2 tablets (200 mg total) by mouth at bedtime. (Patient not taking: Reported on  08/21/2019) 60 tablet 2     Patient Stressors:    Patient Strengths:    Treatment Modalities: Medication Management, Group therapy, Case management,  1 to 1 session with clinician, Psychoeducation, Recreational therapy.   Physician Treatment Plan for Primary Diagnosis: Bipolar 1 disorder, manic, moderate ([place]) Long Term Goal(s): Improvement in symptoms so as ready for discharge Improvement in symptoms so as ready for discharge   Short Term Goals: Ability to verbalize feelings will improve Ability to demonstrate self-control will improve Compliance with prescribed medications will improve  Medication  Management: Evaluate patient's response, side effects, and tolerance of medication regimen.  Therapeutic Interventions: 1 to 1 sessions, Unit Group sessions and Medication administration.  Evaluation of Outcomes: Progressing  Physician Treatment Plan for Secondary Diagnosis: Principal Problem:   Bipolar 1 disorder, manic, moderate (HCC) Active Problems:   Alcohol abuse  Long Term Goal(s): Improvement in symptoms so as ready for discharge Improvement in symptoms so as ready for discharge   Short Term Goals: Ability to verbalize feelings will improve Ability to demonstrate self-control will improve Compliance with prescribed medications will improve     Medication Management: Evaluate patient's response, side effects, and tolerance of medication regimen.  Therapeutic Interventions: 1 to 1 sessions, Unit Group sessions and Medication administration.  Evaluation of Outcomes: Progressing   RN Treatment Plan for Primary Diagnosis: Bipolar 1 disorder, manic, moderate ([place]) Long Term Goal(s): Knowledge of disease and therapeutic regimen to maintain health will improve  Short Term Goals: Ability to remain free from injury will improve, Ability to verbalize frustration and anger appropriately will improve, Ability to demonstrate self-control, Ability to participate in decision making will improve, Ability to verbalize feelings will improve, Ability to disclose and discuss suicidal ideas, Ability to identify and develop effective coping behaviors will improve and Compliance with prescribed medications will improve  Medication Management: RN will administer medications as ordered by provider, will assess and evaluate patient's response and provide education to patient for prescribed medication. RN will report any adverse and/or side effects to prescribing provider.  Therapeutic Interventions: 1 on 1 counseling sessions, Psychoeducation, Medication administration, Evaluate responses to treatment,  Monitor vital signs and CBGs as ordered, Perform/monitor CIWA, COWS, AIMS and Fall Risk screenings as ordered, Perform wound care treatments as ordered.  Evaluation of Outcomes: Progressing   LCSW Treatment Plan for Primary Diagnosis: Bipolar 1 disorder, manic, moderate ([place]) Long Term Goal(s): Safe transition to appropriate next level of care at discharge, Engage patient in therapeutic group addressing interpersonal concerns.  Short Term Goals: Engage patient in aftercare planning with referrals and resources, Increase social support, Increase ability to appropriately verbalize feelings, Increase emotional regulation, Facilitate acceptance of mental health diagnosis and concerns, Facilitate patient progression through stages of change regarding substance use diagnoses and concerns, Identify triggers associated with mental health/substance abuse issues and Increase skills for wellness and recovery  Therapeutic Interventions: Assess for all discharge needs, 1 to 1 time with Social worker, Explore available resources and support systems, Assess for adequacy in community support network, Educate family and significant other(s) on suicide prevention, Complete Psychosocial Assessment, Interpersonal group therapy.  Evaluation of Outcomes: Progressing   Progress in Treatment: Attending groups: Yes. Participating in groups: Yes. Taking medication as prescribed: Yes. Toleration medication: Yes. Family/Significant other contact made: No, will contact:  when consent is given Patient understands diagnosis: Yes. Discussing patient identified problems/goals with staff: Yes. Medical problems stabilized or resolved: Yes. Denies suicidal/homicidal ideation: Yes Issues/concerns per patient self-inventory: No. Other: NA  New  problem(s) identified: No, Describe:  None  New Short Term/Long Term Goal(s):  Engage patient in aftercare planning with referrals and resources, Increase social support, Increase  emotional regulation, Facilitate acceptance of mental health diagnosis and concerns, Increase skills for wellness and recovery  Patient Goals:  "I want to go to groups to learn how to cope with PTSD."  Discharge Plan or Barriers: Patient is homeless and is asking if she can live with a particular female after she discharges. 09/06/19 update: SPE pamphlet, Mobile Crisis information, and AA/NA information provided to patient for additional community support and resources. Pt agreeable to outpatient treatment, but deciding between [place] and [place] depending on where she will be living at discharge. Pt declined inpatient referrals at this time and reports plans to contact her boyfriend to see if she can live with him.  Reason for Continuation of Hospitalization: Mania  Estimated Length of Stay:  5-7 days Attendees: Patient:   09/06/2019 10:04 AM  Physician: Dr. Weber Cooks MD 09/06/2019 10:04 AM  Nursing:  09/06/2019 10:04 AM  RN Care Manager: 09/06/2019 10:04 AM  Social Worker: [place], LCSW 09/06/2019 10:04 AM  Recreational Therapist:  09/06/2019 10:04 AM  Other:  09/06/2019 10:04 AM  Other:  09/06/2019 10:04 AM  Other: 09/06/2019 10:04 AM    Scribe for Treatment Team:  Netta Neat, MSW, LCSW Clinical Social Work 09/06/2019 10:04 AM

## 2019-09-06 NOTE — BHH Suicide Risk Assessment (Signed)
[place] INPATIENT:  Family/Significant Other Suicide Prevention Education  Suicide Prevention Education:  Patient Refusal for Family/Significant Other Suicide Prevention Education: The patient Autumn [name] has refused to provide written consent for family/significant other to be provided Family/Significant Other Suicide Prevention Education during admission and/or prior to discharge.  Physician notified.  SPE completed with pt, as pt refused to consent to family contact. SPI pamphlet provided to pt and pt was encouraged to share information with support network, ask questions, and talk about any concerns relating to SPE. Pt denies access to guns/firearms and verbalized understanding of information provided. Mobile Crisis information also provided to pt.   [place] MSW LCSW 09/06/2019, 10:06 AM

## 2019-09-06 NOTE — Progress Notes (Signed)
Patient has improved in social awareness , good participation with groups , and has not complained , medication compliant , denies any SI/HI/AVH , patient is stable and eating well, no psychotic symptoms , only requiring 15 minutes safety checks no distress.

## 2019-09-06 NOTE — Plan of Care (Signed)
Pt denies depression, anxiety, SI, HI and AVH. Pt has a goal to attend groups. Pt was educated on care plan and verbalizes understanding. Collier Bullock RN Problem: Education: Goal: Knowledge of [facility] Education information/materials will improve Outcome: Progressing Goal: Emotional status will improve Outcome: Progressing Goal: Mental status will improve Outcome: Progressing Goal: Verbalization of understanding the information provided will improve Outcome: Progressing   Problem: Safety: Goal: Periods of time without injury will increase Outcome: Progressing   Problem: Education: Goal: Will be free of psychotic symptoms Outcome: Progressing Goal: Knowledge of the prescribed therapeutic regimen will improve Outcome: Progressing   Problem: Safety: Goal: Ability to redirect hostility and anger into socially appropriate behaviors will improve Outcome: Progressing Goal: Ability to remain free from injury will improve Outcome: Progressing

## 2019-09-06 NOTE — Progress Notes (Signed)
[facility] MD Progress Note  09/06/2019 3:48 PM Autumn [name]  MRN:  [id number] Subjective: Follow-up for this patient with bipolar disorder or schizoaffective disorder.  Patient has been up out of her bed a little more regularly today.  She tells me that her mood is feeling better.  She says she is no longer having any suicidal thoughts and not having hallucinations.  Mood still feels down at times and a little hopeless.  She has been going to a few more groups and eating better.  Taking care of her hygiene a little better.  Has made an effort to get in touch with her boyfriend.  It sounds like he is considering allowing her to come back home but only if he feels pretty confident that she is going to stick with treatment and stay stable. Principal Problem: Bipolar 1 disorder, manic, moderate (HCC) Diagnosis: Principal Problem:   Bipolar 1 disorder, manic, moderate (HCC) Active Problems:   Alcohol abuse  Total Time spent with patient: 30 minutes  Past Psychiatric History: Past history of several prior admissions usually related to noncompliance.  Homeless.  Lots of social difficulties.  Past Medical History:  Past Medical History:  Diagnosis Date  . Allergy    Seasonal, Ultram (seizures)  . Anemia 2005  . Bipolar 1 disorder ([place])   . Glaucoma   . Seizures ([place]) 2008    Past Surgical History:  Procedure Laterality Date  . BREAST BIOPSY Right    x 2  . CESAREAN SECTION     x 2  . LEEP  2000   Family History:  Family History  Problem Relation Age of Onset  . Hypertension Mother   . Hyperlipidemia Mother   . Asthma Mother   . Parkinson's disease Father   . Lung cancer Maternal Grandmother   . Other Paternal Grandfather 49       Cardiac arrest  . Heart disease Paternal Grandfather    Family Psychiatric  History: See previous Social History:  Social History   Substance and Sexual Activity  Alcohol Use Yes  . Alcohol/week: 4.0 standard drinks  . Types: 4 Cans of beer per  week   Comment: Ocassional     Social History   Substance and Sexual Activity  Drug Use Yes  . Types: Marijuana   Comment: percocet -last use over 1 week ago. Heroin-last used 2/11    Social History   Socioeconomic History  . Marital status: Single    Spouse name: Not on file  . Number of children: Not on file  . Years of education: Not on file  . Highest education level: Not on file  Occupational History  . Not on file  Social Needs  . Financial resource strain: Not on file  . Food insecurity    Worry: Not on file    Inability: Not on file  . Transportation needs    Medical: Not on file    Non-medical: Not on file  Tobacco Use  . Smoking status: Current Every Day Smoker    Packs/day: 1.00    Years: 9.00    Pack years: 9.00  . Smokeless tobacco: Never Used  . Tobacco comment: Patient not interested  Substance and Sexual Activity  . Alcohol use: Yes    Alcohol/week: 4.0 standard drinks    Types: 4 Cans of beer per week    Comment: Ocassional  . Drug use: Yes    Types: Marijuana    Comment: percocet -last use over 1  week ago. Heroin-last used 2/11  . Sexual activity: Yes    Birth control/protection: None  Lifestyle  . Physical activity    Days per week: Not on file    Minutes per session: Not on file  . Stress: Not on file  Relationships  . Social Herbalist on phone: Not on file    Gets together: Not on file    Attends religious service: Not on file    Active member of club or organization: Not on file    Attends meetings of clubs or organizations: Not on file    Relationship status: Not on file  Other Topics Concern  . Not on file  Social History Narrative  . Not on file   Additional Social History:                         Sleep: Fair  Appetite:  Fair  Current Medications: Current Facility-Administered Medications  Medication Dose Route Frequency Provider Last Rate Last Dose  . acetaminophen (TYLENOL) tablet 650 mg  650 mg  Oral Q6H PRN Caroline Sauger, NP   650 mg at 09/05/19 1428  . alum & mag hydroxide-simeth (MAALOX/MYLANTA) 200-200-20 MG/5ML suspension 30 mL  30 mL Oral Q4H PRN Caroline Sauger, NP      . ARIPiprazole (ABILIFY) tablet 20 mg  20 mg Oral Daily Caroline Sauger, NP   20 mg at 09/06/19 0807  . divalproex (DEPAKOTE) DR tablet 500 mg  500 mg Oral Q12H Caroline Sauger, NP   500 mg at 09/06/19 0807  . feeding supplement (ENSURE ENLIVE) (ENSURE ENLIVE) liquid 237 mL  237 mL Oral BID BM Money, Lowry Ram, FNP   237 mL at 09/06/19 1130  . hydrOXYzine (ATARAX/VISTARIL) tablet 25 mg  25 mg Oral TID PRN Caroline Sauger, NP   25 mg at 08/31/19 0808  . ibuprofen (ADVIL) tablet 600 mg  600 mg Oral Q6H PRN Caroline Sauger, NP      . lithium carbonate (ESKALITH) CR tablet 450 mg  450 mg Oral Q12H Caroline Sauger, NP   450 mg at 09/06/19 0807  . magnesium hydroxide (MILK OF MAGNESIA) suspension 30 mL  30 mL Oral Daily PRN Caroline Sauger, NP   30 mL at 09/03/19 0751  . nicotine (NICODERM CQ - dosed in mg/24 hours) patch 21 mg  21 mg Transdermal Daily Rosine Solecki, [name], MD   21 mg at 09/06/19 0810  . Norgestimate-Ethinyl Estradiol Triphasic 0.18/0.215/0.25 MG-35 MCG tablet 1 tablet  1 tablet Oral Daily Caroline Sauger, NP      . traZODone (DESYREL) tablet 50 mg  50 mg Oral QHS,MR X 1 Money, Lowry Ram, FNP   50 mg at 09/05/19 2122    Lab Results: No results found for this or any previous visit (from the past 48 hour(s)).  Blood Alcohol level:  Lab Results  Component Value Date   ETH <10 08/30/2019   ETH <10 0000000    Metabolic Disorder Labs: Lab Results  Component Value Date   HGBA1C 5.0 11/21/2018   MPG 96.8 11/21/2018   MPG 93.93 04/13/2018   No results found for: PROLACTIN Lab Results  Component Value Date   CHOL 196 11/21/2018   TRIG 97 11/21/2018   HDL 53 11/21/2018   CHOLHDL 3.7 11/21/2018   VLDL 19 11/21/2018   LDLCALC 124 (H) 11/21/2018   LDLCALC  117 (H) 04/13/2018    Physical Findings: AIMS:  , ,  ,  ,  CIWA:    COWS:     Musculoskeletal: Strength & Muscle Tone: within normal limits Gait & Station: normal Patient leans: N/A  Psychiatric Specialty Exam: Physical Exam  Nursing note and vitals reviewed. Constitutional: She appears well-developed and well-nourished.  HENT:  Head: Normocephalic and atraumatic.  Eyes: Pupils are equal, round, and reactive to light. Conjunctivae are normal.  Neck: Normal range of motion.  Cardiovascular: Regular rhythm and normal heart sounds.  Respiratory: Effort normal. No respiratory distress.  GI: Soft.  Musculoskeletal: Normal range of motion.  Neurological: She is alert.  Skin: Skin is warm and dry.  Psychiatric: Judgment normal. Her affect is blunt. Her speech is delayed. She is slowed. Thought content is not paranoid. Cognition and memory are impaired. She expresses no homicidal and no suicidal ideation.    Review of Systems  Constitutional: Negative.   HENT: Negative.   Eyes: Negative.   Respiratory: Negative.   Cardiovascular: Negative.   Gastrointestinal: Negative.   Musculoskeletal: Negative.   Skin: Negative.   Neurological: Negative.   Psychiatric/Behavioral: Positive for depression and memory loss. Negative for hallucinations, substance abuse and suicidal ideas. The patient has insomnia. The patient is not nervous/anxious.     Blood pressure 92/68, pulse 64, temperature (!) 97.5 F (36.4 C), temperature source Oral, resp. rate 15, height 5\' 1"  (1.549 m), weight 47.6 kg, last menstrual period 08/21/2019, SpO2 100 %.Body mass index is 19.84 kg/m.  General Appearance: Casual  Eye Contact:  Fair  Speech:  Clear and Coherent  Volume:  Normal  Mood:  Dysphoric  Affect:  Blunt  Thought Process:  Coherent  Orientation:  Full (Time, Place, and Person)  Thought Content:  Logical  Suicidal Thoughts:  Yes.  without intent/plan  Homicidal Thoughts:  No  Memory:  Immediate;    Fair Recent;   Fair Remote;   Fair  Judgement:  Fair  Insight:  Fair  Psychomotor Activity:  Decreased  Concentration:  Concentration: Fair  Recall:  [employer] of Knowledge:  Fair  Language:  Fair  Akathisia:  No  Handed:  Right  AIMS (if indicated):     Assets:  Desire for Improvement Physical Health Resilience  ADL's:  Intact  Cognition:  Impaired,  Mild  Sleep:  Number of Hours: 8     Treatment Plan Summary: Daily contact with patient to assess and evaluate symptoms and progress in treatment, Medication management and Plan Patient seems to be gradually getting better in a way that 1 would expect.  Not acutely dangerous.  Tolerating medicine well.  Going to more groups.  She seems to be showing more interest in discharge planning I am hoping that within the next day or so she or somebody from the treatment team can talk with her boyfriend and get enough reassurance that she will feel comfortable being discharged.  No change to medicine today.  Supportive counseling and encouragement.`  Alethia Berthold, MD 09/06/2019, 3:48 PM

## 2019-09-06 NOTE — Plan of Care (Signed)
  Problem: Education: Goal: Knowledge of [facility] Education information/materials will improve 09/06/2019 2252 by Clemens Catholic, RN Outcome: Progressing 09/06/2019 2250 by Clemens Catholic, RN Outcome: Progressing Goal: Emotional status will improve 09/06/2019 2252 by Clemens Catholic, RN Outcome: Progressing 09/06/2019 2250 by Clemens Catholic, RN Outcome: Progressing Goal: Mental status will improve 09/06/2019 2252 by Clemens Catholic, RN Outcome: Progressing 09/06/2019 2250 by Clemens Catholic, RN Outcome: Progressing Goal: Verbalization of understanding the information provided will improve 09/06/2019 2252 by Clemens Catholic, RN Outcome: Progressing 09/06/2019 2250 by Clemens Catholic, RN Outcome: Progressing   Problem: Safety: Goal: Periods of time without injury will increase 09/06/2019 2252 by Clemens Catholic, RN Outcome: Progressing 09/06/2019 2250 by Clemens Catholic, RN Outcome: Progressing   Problem: Education: Goal: Will be free of psychotic symptoms 09/06/2019 2252 by Clemens Catholic, RN Outcome: Progressing 09/06/2019 2250 by Clemens Catholic, RN Outcome: Progressing Goal: Knowledge of the prescribed therapeutic regimen will improve 09/06/2019 2252 by Clemens Catholic, RN Outcome: Progressing 09/06/2019 2250 by Clemens Catholic, RN Outcome: Progressing   Problem: Safety: Goal: Ability to redirect hostility and anger into socially appropriate behaviors will improve 09/06/2019 2252 by Clemens Catholic, RN Outcome: Progressing 09/06/2019 2250 by Clemens Catholic, RN Outcome: Progressing Goal: Ability to remain free from injury will improve 09/06/2019 2252 by Clemens Catholic, RN Outcome: Progressing 09/06/2019 2250 by Clemens Catholic, RN Outcome: Progressing

## 2019-09-06 NOTE — [facility] Group Notes (Signed)
LCSW Group Therapy Note  09/06/2019 2:00 PM  Type of Therapy/Topic:  Group Therapy:  Balance in Life  Participation Level:  None  Description of Group:    This group will address the concept of balance and how it feels and looks when one is unbalanced. Patients will be encouraged to process areas in their lives that are out of balance and identify reasons for remaining unbalanced. Facilitators will guide patients in utilizing problem-solving interventions to address and correct the stressor making their life unbalanced. Understanding and applying boundaries will be explored and addressed for obtaining and maintaining a balanced life. Patients will be encouraged to explore ways to assertively make their unbalanced needs known to significant others in their lives, using other group members and facilitator for support and feedback.  Therapeutic Goals: 1. Patient will identify two or more emotions or situations they have that consume much of in their lives. 2. Patient will identify signs/triggers that life has become out of balance:  3. Patient will identify two ways to set boundaries in order to achieve balance in their lives:  4. Patient will demonstrate ability to communicate their needs through discussion and/or role plays  Summary of Patient Progress:  Pt was present for the majority of group, but did not engage in the group discussion.    Therapeutic Modalities:   Cognitive Behavioral Therapy Solution-Focused Therapy Assertiveness Training  Evalina Field, MSW, LCSW Clinical Social Work 09/06/2019 2:00 PM

## 2019-09-07 DIAGNOSIS — F1010 Alcohol abuse, uncomplicated: Secondary | ICD-10-CM | POA: Diagnosis not present

## 2019-09-07 DIAGNOSIS — F3112 Bipolar disorder, current episode manic without psychotic features, moderate: Secondary | ICD-10-CM | POA: Diagnosis not present

## 2019-09-07 MED ORDER — TRAZODONE HCL 100 MG PO TABS
100.0000 mg | ORAL_TABLET | Freq: Every evening | ORAL | Status: DC | PRN
  Administered 2019-09-07 – 2019-09-08 (×2): 100 mg via ORAL
  Filled 2019-09-07 (×2): qty 1

## 2019-09-07 NOTE — Plan of Care (Signed)
Pt rates depression at 2/10, anxiety 1/10 and hopelessness 1/10. Pt was educated on care plan and verbalizes understanding. Collier Bullock RN Problem: Education: Goal: Knowledge of [facility] Education information/materials will improve Outcome: Progressing Goal: Emotional status will improve Outcome: Progressing Goal: Mental status will improve Outcome: Progressing Goal: Verbalization of understanding the information provided will improve Outcome: Progressing   Problem: Safety: Goal: Periods of time without injury will increase Outcome: Progressing   Problem: Education: Goal: Will be free of psychotic symptoms Outcome: Progressing Goal: Knowledge of the prescribed therapeutic regimen will improve Outcome: Progressing   Problem: Safety: Goal: Ability to redirect hostility and anger into socially appropriate behaviors will improve Outcome: Progressing Goal: Ability to remain free from injury will improve Outcome: Progressing

## 2019-09-07 NOTE — Progress Notes (Signed)
Recreation Therapy Notes    Date: 09/07/2019  Time: 9:30 am   Location: Craft room   Behavioral response: N/A   Intervention Topic: Communication   Discussion/Intervention: Patient did not attend group.   Clinical Observations/Feedback:  Patient did not attend group.   Katrese Shell LRT/CTRS         Anabelen Kaminsky 09/07/2019 10:40 AM

## 2019-09-07 NOTE — [facility] Group Notes (Signed)
LCSW Group Therapy Note   09/07/2019 1:00 PM  Type of Therapy and Topic:  Group Therapy:  Overcoming Obstacles   Participation Level:  Did Not Attend   Description of Group:    In this group patients will be encouraged to explore what they see as obstacles to their own wellness and recovery. They will be guided to discuss their thoughts, feelings, and behaviors related to these obstacles. The group will process together ways to cope with barriers, with attention given to specific choices patients can make. Each patient will be challenged to identify changes they are motivated to make in order to overcome their obstacles. This group will be process-oriented, with patients participating in exploration of their own experiences as well as giving and receiving support and challenge from other group members.   Therapeutic Goals: 1. Patient will identify personal and current obstacles as they relate to admission. 2. Patient will identify barriers that currently interfere with their wellness or overcoming obstacles.  3. Patient will identify feelings, thought process and behaviors related to these barriers. 4. Patient will identify two changes they are willing to make to overcome these obstacles:      Summary of Patient Progress X   Therapeutic Modalities:   Cognitive Behavioral Therapy Solution Focused Therapy Motivational Interviewing Relapse Prevention Therapy  Assunta Curtis, MSW, LCSW 09/07/2019 12:52 PM

## 2019-09-07 NOTE — Progress Notes (Signed)
Pt was calm,  cooperative and went to groups today. She is looking forward to being discharged. Collier Bullock RN

## 2019-09-07 NOTE — Progress Notes (Signed)
[facility] MD Progress Note  09/07/2019 3:03 PM Joycie Zunker  MRN:  [id number]   Subjective: Patient is seen for follow-up for admission for bipolar 1 disorder manic episode.  Patient reports today that her only concern is she does not know where she is going to live that when she discharges from the hospital.  She states that she cannot return to live with her boyfriend because someone there took out a restraining order against her and she has to stay away until after December 1.  Patient requested to be discharged to the group home however patient does not have any insurance to pay for the group home nor finds.  Then asked if she can stay in the hospital until December 1 and the patient is informed that she cannot stay here for that long.  Patient then states that she will just go to a shelter when she discharges from the hospital.  Patient denies any suicidal homicidal ideations and denies any hallucinations.  Patient reports that she has been sleeping good and that her appetite is improving.  Currently the patient states that she has no other concerns that she wishes to have addressed at this time.  Principal Problem: Bipolar 1 disorder, manic, moderate (HCC) Diagnosis: Principal Problem:   Bipolar 1 disorder, manic, moderate (HCC) Active Problems:   Alcohol abuse  Total Time spent with patient: 20 minutes  Past Psychiatric History: Past history of several prior admissions usually related to noncompliance.  Homeless.  Lots of social difficulties.  Past Medical History:  Past Medical History:  Diagnosis Date  . Allergy    Seasonal, Ultram (seizures)  . Anemia 2005  . Bipolar 1 disorder ([place])   . Glaucoma   . Seizures ([place]) 2008    Past Surgical History:  Procedure Laterality Date  . BREAST BIOPSY Right    x 2  . CESAREAN SECTION     x 2  . LEEP  2000   Family History:  Family History  Problem Relation Age of Onset  . Hypertension Mother   . Hyperlipidemia Mother   . Asthma  Mother   . Parkinson's disease Father   . Lung cancer Maternal Grandmother   . Other Paternal Grandfather 62       Cardiac arrest  . Heart disease Paternal Grandfather    Family Psychiatric  History: See previous Social History:  Social History   Substance and Sexual Activity  Alcohol Use Yes  . Alcohol/week: 4.0 standard drinks  . Types: 4 Cans of beer per week   Comment: Ocassional     Social History   Substance and Sexual Activity  Drug Use Yes  . Types: Marijuana   Comment: percocet -last use over 1 week ago. Heroin-last used 2/11    Social History   Socioeconomic History  . Marital status: Single    Spouse name: Not on file  . Number of children: Not on file  . Years of education: Not on file  . Highest education level: Not on file  Occupational History  . Not on file  Social Needs  . Financial resource strain: Not on file  . Food insecurity    Worry: Not on file    Inability: Not on file  . Transportation needs    Medical: Not on file    Non-medical: Not on file  Tobacco Use  . Smoking status: Current Every Day Smoker    Packs/day: 1.00    Years: 9.00    Pack years: 9.00  .  Smokeless tobacco: Never Used  . Tobacco comment: Patient not interested  Substance and Sexual Activity  . Alcohol use: Yes    Alcohol/week: 4.0 standard drinks    Types: 4 Cans of beer per week    Comment: Ocassional  . Drug use: Yes    Types: Marijuana    Comment: percocet -last use over 1 week ago. Heroin-last used 2/11  . Sexual activity: Yes    Birth control/protection: None  Lifestyle  . Physical activity    Days per week: Not on file    Minutes per session: Not on file  . Stress: Not on file  Relationships  . Social Herbalist on phone: Not on file    Gets together: Not on file    Attends religious service: Not on file    Active member of club or organization: Not on file    Attends meetings of clubs or organizations: Not on file    Relationship status:  Not on file  Other Topics Concern  . Not on file  Social History Narrative  . Not on file   Additional Social History:                         Sleep: Good  Appetite:  Good  Current Medications: Current Facility-Administered Medications  Medication Dose Route Frequency Provider Last Rate Last Dose  . acetaminophen (TYLENOL) tablet 650 mg  650 mg Oral Q6H PRN Caroline Sauger, NP   650 mg at 09/06/19 1654  . alum & mag hydroxide-simeth (MAALOX/MYLANTA) 200-200-20 MG/5ML suspension 30 mL  30 mL Oral Q4H PRN Caroline Sauger, NP      . ARIPiprazole (ABILIFY) tablet 20 mg  20 mg Oral Daily Caroline Sauger, NP   20 mg at 09/07/19 0807  . divalproex (DEPAKOTE) DR tablet 500 mg  500 mg Oral Q12H Caroline Sauger, NP   500 mg at 09/07/19 0807  . feeding supplement (ENSURE ENLIVE) (ENSURE ENLIVE) liquid 237 mL  237 mL Oral BID BM Samreen Seltzer, Darnelle Maffucci B, FNP   237 mL at 09/07/19 1000  . hydrOXYzine (ATARAX/VISTARIL) tablet 25 mg  25 mg Oral TID PRN Caroline Sauger, NP   25 mg at 08/31/19 0808  . ibuprofen (ADVIL) tablet 600 mg  600 mg Oral Q6H PRN Caroline Sauger, NP   600 mg at 09/07/19 1215  . lithium carbonate (ESKALITH) CR tablet 450 mg  450 mg Oral Q12H Caroline Sauger, NP   450 mg at 09/07/19 0807  . magnesium hydroxide (MILK OF MAGNESIA) suspension 30 mL  30 mL Oral Daily PRN Caroline Sauger, NP   30 mL at 09/03/19 0751  . nicotine (NICODERM CQ - dosed in mg/24 hours) patch 21 mg  21 mg Transdermal Daily Clapacs, [name], MD   21 mg at 09/07/19 0808  . Norgestimate-Ethinyl Estradiol Triphasic 0.18/0.215/0.25 MG-35 MCG tablet 1 tablet  1 tablet Oral Daily Caroline Sauger, NP      . traZODone (DESYREL) tablet 50 mg  50 mg Oral QHS,MR X 1 Gerardo Caiazzo B, FNP   50 mg at 09/06/19 2330    Lab Results: No results found for this or any previous visit (from the past 48 hour(s)).  Blood Alcohol level:  Lab Results  Component Value Date   [facility] <10  08/30/2019   ETH <10 0000000    Metabolic Disorder Labs: Lab Results  Component Value Date   HGBA1C 5.0 11/21/2018   MPG 96.8 11/21/2018  MPG 93.93 04/13/2018   No results found for: PROLACTIN Lab Results  Component Value Date   CHOL 196 11/21/2018   TRIG 97 11/21/2018   HDL 53 11/21/2018   CHOLHDL 3.7 11/21/2018   VLDL 19 11/21/2018   LDLCALC 124 (H) 11/21/2018   LDLCALC 117 (H) 04/13/2018    Physical Findings: AIMS:  , ,  ,  ,    CIWA:    COWS:     Musculoskeletal: Strength & Muscle Tone: within normal limits Gait & Station: normal Patient leans: N/A  Psychiatric Specialty Exam: Physical Exam  Nursing note and vitals reviewed. Constitutional: She is oriented to person, place, and time. She appears well-developed and well-nourished.  Cardiovascular: Normal rate.  Respiratory: Effort normal.  Musculoskeletal: Normal range of motion.  Neurological: She is alert and oriented to person, place, and time.  Skin: Skin is warm.    Review of Systems  Constitutional: Negative.   HENT: Negative.   Eyes: Negative.   Respiratory: Negative.   Cardiovascular: Negative.   Gastrointestinal: Negative.   Genitourinary: Negative.   Musculoskeletal: Negative.   Skin: Negative.   Neurological: Negative.   Endo/Heme/Allergies: Negative.   Psychiatric/Behavioral: Positive for depression.    Blood pressure 91/61, pulse 65, temperature 97.6 F (36.4 C), temperature source Oral, resp. rate 15, height 5\' 1"  (1.549 m), weight 47.6 kg, last menstrual period 08/21/2019, SpO2 99 %.Body mass index is 19.84 kg/m.  General Appearance: Casual and Disheveled  Eye Contact:  Good  Speech:  Clear and Coherent and Normal Rate  Volume:  Normal  Mood:  Depressed  Affect:  Flat  Thought Process:  Coherent and Descriptions of Associations: Intact  Orientation:  Full (Time, Place, and Person)  Thought Content:  WDL  Suicidal Thoughts:  No  Homicidal Thoughts:  No  Memory:  Immediate;    Good Recent;   Good Remote;   Good  Judgement:  Fair  Insight:  Fair  Psychomotor Activity:  Normal  Concentration:  Concentration: Good  Recall:  Good  Fund of Knowledge:  Good  Language:  Good  Akathisia:  No  Handed:  Right  AIMS (if indicated):     Assets:  Communication Skills Desire for Improvement Physical Health Resilience  ADL's:  Intact  Cognition:  WNL  Sleep:  Number of Hours: 4.75   Assessment: Patient presents in her bed and is lying down but is awake.  Patient is pleasant, calm, and cooperative.  She does have a flat affect and at first seem to be concerned about where she was going to live but then once she decided on going to a shelter she did not seem as concerned.  Patient has been compliant with her medications and treatment thus far and is nearing a baseline state for discharge.  Patient has not been completely interactive with staff and group therapy sessions.  She has been more active than she was when she first admitted and with very recent concern of being homeless again there is concern for the patient stability.  Discussed with Dr. Weber Cooks and social work and they were trying to work on getting the patient to [facility].  It was discussed about discharging the patient on Monday with transportation to [facility].  Treatment Plan Summary: Daily contact with patient to assess and evaluate symptoms and progress in treatment and Medication management Continue Abilify 20 mg p.o. daily for bipolar 1 disorder Continue Depakote DR 500 mg p.o. every 12 hours for bipolar 1 disorder  Continue Vistaril 25 mg p.o. 3 times daily as needed for anxiety Continue Eskalith 450 mg p.o. every 12 hours for bipolar 1 disorder Increase trazodone to 100 mg p.o. nightly as needed for insomnia Encourage group therapy participation Continue every 15 minute safety checks CSW is working on disposition planning for patient to go to [facility], FNP 09/07/2019, 3:03 PM

## 2019-09-08 DIAGNOSIS — F3112 Bipolar disorder, current episode manic without psychotic features, moderate: Secondary | ICD-10-CM | POA: Diagnosis not present

## 2019-09-08 DIAGNOSIS — F1010 Alcohol abuse, uncomplicated: Secondary | ICD-10-CM | POA: Diagnosis not present

## 2019-09-08 NOTE — Plan of Care (Signed)
  Problem: Education: Goal: Knowledge of [facility] Education information/materials will improve Outcome: Progressing Goal: Emotional status will improve Outcome: Progressing Goal: Mental status will improve Outcome: Progressing Goal: Verbalization of understanding the information provided will improve Outcome: Progressing   Problem: Safety: Goal: Periods of time without injury will increase Outcome: Progressing   Problem: Education: Goal: Will be free of psychotic symptoms Outcome: Progressing Goal: Knowledge of the prescribed therapeutic regimen will improve Outcome: Progressing   Problem: Safety: Goal: Ability to redirect hostility and anger into socially appropriate behaviors will improve Outcome: Progressing Goal: Ability to remain free from injury will improve Outcome: Progressing   

## 2019-09-08 NOTE — Progress Notes (Signed)
Patient alert and oriented x 4, affect is flat but she brightens upon approach , no distress noted, she denies SI/HI/AVH, interacting appropriately with peers and staff, thoughts are organized and coherent, compliant with medication regimen no distress noted. 15 minutes safety checks maintained, will continue to monitor.

## 2019-09-08 NOTE — Plan of Care (Signed)
D: Patient denies SI/HI/AVH. Patient verbally contracts for safety. Patient is calm, cooperative and pleasant. Patient is seen in milieu interacting with peers. Patient has no complaints at this time.  A: Patient was assessed by this nurse. Patient was oriented to unit. Patient's safety was maintained on unit. Q x 15 minute observation checks were completed for safety. Patient care plan was reviewed. Patient was offered support and encouragement. Patient was encourage to attend groups, participate in unit activities and continue with plan of care.    R: Patient has no complaints of pain at this time. Patient is receptive to treatment and safety maintained on unit.     Problem: Education: Goal: Knowledge of [facility] Education information/materials will improve Outcome: Not Progressing Goal: Emotional status will improve Outcome: Not Progressing Goal: Mental status will improve Outcome: Not Progressing

## 2019-09-08 NOTE — Progress Notes (Signed)
Patient has been pleasant and  and appropriate in the unit , complying  with her medical regimen and has shown improvements and responding well mentally and physically, attending groups and participating in peer activities with out any issues , patient denies any SI/HI/AVH , sleep is good and long , appetite is good as well, denies any complain only requiring 15 minutes safety checks no distress.

## 2019-09-08 NOTE — [facility] Group Notes (Signed)
LCSW Aftercare Discharge Planning Group Note   09/08/2019 1300  Type of Group and Topic: Psychoeducational Group:  Discharge Planning  Participation Level:  Active  Description of Group  Discharge planning group reviews patient's anticipated discharge plans and assists patients to anticipate and address any barriers to wellness/recovery in the community.  Suicide prevention education is reviewed with patients in group.  Therapeutic Goals 1. Patients will state their anticipated discharge plan and mental health aftercare 2. Patients will identify potential barriers to wellness in the community setting 3. Patients will engage in problem solving, solution focused discussion of ways to anticipate and address barriers to wellness/recovery  Summary of Patient Progress: Pt came to group late but was active in the discussion regarding wellness and use of social supports.  Pt made several comments when we discussed scenarios that could occur after discharge.     Plan for Discharge/Comments:    Transportation Means:   Supports:  Therapeutic Modalities: Motivational Interviewing    Joanne Chars, LCSW 09/08/2019 2:20 PM

## 2019-09-08 NOTE — Progress Notes (Signed)
[facility] MD Progress Note  09/08/2019 11:15 AM Autumn [name]  MRN:  [id number] Subjective:    This 43 year old patient is well-known for her bipolar type disorder and repeated admissions complicated by alcohol abuse and poor compliance, disorganization and disruptive public behaviors, and confusion led to this admission.    Patient prefers to stay in bed for the interview makes no eye contact her speech is soft and slow she states she is here to "get rest" she denies wanting to harm self or others and denies hallucinations but does not answer many questions more meaningfully than that. Denies racing thoughts, no EPS or TD Principal Problem: Bipolar 1 disorder, manic, moderate (HCC) Diagnosis: Principal Problem:   Bipolar 1 disorder, manic, moderate (HCC) Active Problems:   Alcohol abuse  Total Time spent with patient: 20 minutes  Past Psychiatric History: Extensive  Past Medical History:  Past Medical History:  Diagnosis Date  . Allergy    Seasonal, Ultram (seizures)  . Anemia 2005  . Bipolar 1 disorder ([place])   . Glaucoma   . Seizures ([place]) 2008    Past Surgical History:  Procedure Laterality Date  . BREAST BIOPSY Right    x 2  . CESAREAN SECTION     x 2  . LEEP  2000   Family History:  Family History  Problem Relation Age of Onset  . Hypertension Mother   . Hyperlipidemia Mother   . Asthma Mother   . Parkinson's disease Father   . Lung cancer Maternal Grandmother   . Other Paternal Grandfather 36       Cardiac arrest  . Heart disease Paternal Grandfather    Family Psychiatric  History: No new data Social History:  Social History   Substance and Sexual Activity  Alcohol Use Yes  . Alcohol/week: 4.0 standard drinks  . Types: 4 Cans of beer per week   Comment: Ocassional     Social History   Substance and Sexual Activity  Drug Use Yes  . Types: Marijuana   Comment: percocet -last use over 1 week ago. Heroin-last used 2/11    Social History    Socioeconomic History  . Marital status: Single    Spouse name: Not on file  . Number of children: Not on file  . Years of education: Not on file  . Highest education level: Not on file  Occupational History  . Not on file  Social Needs  . Financial resource strain: Not on file  . Food insecurity    Worry: Not on file    Inability: Not on file  . Transportation needs    Medical: Not on file    Non-medical: Not on file  Tobacco Use  . Smoking status: Current Every Day Smoker    Packs/day: 1.00    Years: 9.00    Pack years: 9.00  . Smokeless tobacco: Never Used  . Tobacco comment: Patient not interested  Substance and Sexual Activity  . Alcohol use: Yes    Alcohol/week: 4.0 standard drinks    Types: 4 Cans of beer per week    Comment: Ocassional  . Drug use: Yes    Types: Marijuana    Comment: percocet -last use over 1 week ago. Heroin-last used 2/11  . Sexual activity: Yes    Birth control/protection: None  Lifestyle  . Physical activity    Days per week: Not on file    Minutes per session: Not on file  . Stress: Not on file  Relationships  .  Social Herbalist on phone: Not on file    Gets together: Not on file    Attends religious service: Not on file    Active member of club or organization: Not on file    Attends meetings of clubs or organizations: Not on file    Relationship status: Not on file  Other Topics Concern  . Not on file  Social History Narrative  . Not on file   Additional Social History:                         Sleep: Fair  Appetite:  Fair  Current Medications: Current Facility-Administered Medications  Medication Dose Route Frequency Provider Last Rate Last Dose  . acetaminophen (TYLENOL) tablet 650 mg  650 mg Oral Q6H PRN Caroline Sauger, NP   650 mg at 09/06/19 1654  . alum & mag hydroxide-simeth (MAALOX/MYLANTA) 200-200-20 MG/5ML suspension 30 mL  30 mL Oral Q4H PRN Caroline Sauger, NP      .  ARIPiprazole (ABILIFY) tablet 20 mg  20 mg Oral Daily Caroline Sauger, NP   20 mg at 09/08/19 0836  . divalproex (DEPAKOTE) DR tablet 500 mg  500 mg Oral Q12H Caroline Sauger, NP   500 mg at 09/08/19 0835  . feeding supplement (ENSURE ENLIVE) (ENSURE ENLIVE) liquid 237 mL  237 mL Oral BID BM Money, Darnelle Maffucci B, FNP   237 mL at 09/08/19 1040  . hydrOXYzine (ATARAX/VISTARIL) tablet 25 mg  25 mg Oral TID PRN Caroline Sauger, NP   25 mg at 09/07/19 2139  . ibuprofen (ADVIL) tablet 600 mg  600 mg Oral Q6H PRN Caroline Sauger, NP   600 mg at 09/07/19 1800  . lithium carbonate (ESKALITH) CR tablet 450 mg  450 mg Oral Q12H Caroline Sauger, NP   450 mg at 09/08/19 0835  . magnesium hydroxide (MILK OF MAGNESIA) suspension 30 mL  30 mL Oral Daily PRN Caroline Sauger, NP   30 mL at 09/07/19 1516  . nicotine (NICODERM CQ - dosed in mg/24 hours) patch 21 mg  21 mg Transdermal Daily Clapacs, [name], MD   21 mg at 09/08/19 0836  . Norgestimate-Ethinyl Estradiol Triphasic 0.18/0.215/0.25 MG-35 MCG tablet 1 tablet  1 tablet Oral Daily Caroline Sauger, NP      . traZODone (DESYREL) tablet 100 mg  100 mg Oral QHS PRN Money, Lowry Ram, FNP   100 mg at 09/07/19 2138    Lab Results: No results found for this or any previous visit (from the past 48 hour(s)).  Blood Alcohol level:  Lab Results  Component Value Date   ETH <10 08/30/2019   ETH <10 0000000    Metabolic Disorder Labs: Lab Results  Component Value Date   HGBA1C 5.0 11/21/2018   MPG 96.8 11/21/2018   MPG 93.93 04/13/2018   No results found for: PROLACTIN Lab Results  Component Value Date   CHOL 196 11/21/2018   TRIG 97 11/21/2018   HDL 53 11/21/2018   CHOLHDL 3.7 11/21/2018   VLDL 19 11/21/2018   LDLCALC 124 (H) 11/21/2018   LDLCALC 117 (H) 04/13/2018    Physical Findings: AIMS:  , ,  ,  ,    CIWA:    COWS:     Musculoskeletal: Strength & Muscle Tone: within normal limits Gait & Station:  normal Patient leans: N/A  Psychiatric Specialty Exam: Physical Exam  ROS  Blood pressure (!) 87/61, pulse 66, temperature 98.2 F (36.8  C), temperature source Oral, resp. rate 18, height 5\' 1"  (1.549 m), weight 47.6 kg, last menstrual period 08/21/2019, SpO2 100 %.Body mass index is 19.84 kg/m.  General Appearance: Casual  Eye Contact:  Minimal  Speech:  Slow  Volume:  Decreased  Mood:  Anxious and Depressed  Affect:  Blunt  Thought Process:  Disorganized and Descriptions of Associations: Circumstantial  Orientation:  Other:  Will not answer other than person place situation  Thought Content:  Illogical and Versus poverty of content  Suicidal Thoughts:  No  Homicidal Thoughts:  No  Memory:  Recent;   Poor Remote;   Poor  Judgement:  Impaired  Insight:  Lacking  Psychomotor Activity:  Decreased  Concentration:  Concentration: Fair and Attention Span: Poor  Recall:  Poor  Fund of Knowledge:  Poor  Language:  Poor  Akathisia:  Negative  Handed:  Right  AIMS (if indicated):     Assets:  Leisure Time Physical Health  ADL's:  Intact  Cognition:  WNL  Sleep:  Number of Hours: 7     Treatment Plan Summary: Daily contact with patient to assess and evaluate symptoms and progress in treatment and Medication management  Continue lithium and aripiprazole treatment continue reality based in cognitive therapy no change in precautions monitor for safety/monitor drug levels  Conda Wannamaker, MD 09/08/2019, 11:15 AM

## 2019-09-09 MED ORDER — TRAZODONE HCL 50 MG PO TABS
150.0000 mg | ORAL_TABLET | Freq: Every day | ORAL | Status: DC
  Administered 2019-09-09: 21:00:00 150 mg via ORAL
  Filled 2019-09-09: qty 3

## 2019-09-09 NOTE — BHH Group Notes (Signed)
LCSW Group Therapy Note 09/09/2019 1:15pm  Type of Therapy and Topic: Group Therapy: Feelings Around Returning Home & Establishing a Supportive Framework and Supporting Oneself When Supports Not Available  Participation Level: Did Not Attend  Description of Group:  Patients first processed thoughts and feelings about upcoming discharge. These included fears of upcoming changes, lack of change, new living environments, judgements and expectations from others and overall stigma of mental health issues. The group then discussed the definition of a supportive framework, what that looks and feels like, and how do to discern it from an unhealthy non-supportive network. The group identified different types of supports as well as what to do when your family/friends are less than helpful or unavailable  Therapeutic Goals  1. Patient will identify one healthy supportive network that they can use at discharge. 2. Patient will identify one factor of a supportive framework and how to tell it from an unhealthy network. 3. Patient able to identify one coping skill to use when they do not have positive supports from others. 4. Patient will demonstrate ability to communicate their needs through discussion and/or role plays.  Summary of Patient Progress:  Pt was invited to attend group but chose not to attend. CSW will continue to encourage pt to attend group throughout their admission.   Therapeutic Modalities Cognitive Behavioral Therapy Motivational Interviewing   Cheree Ditto, LCSW 09/09/2019 12:39 PM

## 2019-09-09 NOTE — Plan of Care (Signed)
D- Patient alert and oriented. Patient presented in a pleasant mood on assessment stating that she slept good last night and the only complaint that she had was of left side tooth pain. Patient rated her pain a "7/10", in which she did request medication from this Probation officer. Patient denied any signs/symptoms of depression/anxiety to this Probation officer. Patient also denied SI, HI, AVH at this time. Patient's goal for today was to "attend groups", however, she has only come out of her room for meals and medication. Although, this afternoon, patient did come out for a little while and was present in the dayroom and then went back to her room.  A- Scheduled medications administered to patient, per MD orders. Support and encouragement provided.  Routine safety checks conducted every 15 minutes.  Patient informed to notify staff with problems or concerns.  R- No adverse drug reactions noted. Patient contracts for safety at this time. Patient compliant with medications and treatment plan. Patient receptive, calm, and cooperative. Patient interacts well with others on the unit.  Patient remains safe at this time.  Problem: Education: Goal: Knowledge of [place] [facility] Education information/materials will improve Outcome: Progressing Goal: Emotional status will improve Outcome: Progressing Goal: Mental status will improve Outcome: Progressing Goal: Verbalization of understanding the information provided will improve Outcome: Progressing   Problem: Safety: Goal: Periods of time without injury will increase Outcome: Progressing   Problem: Education: Goal: Will be free of psychotic symptoms Outcome: Progressing Goal: Knowledge of the prescribed therapeutic regimen will improve Outcome: Progressing   Problem: Safety: Goal: Ability to redirect hostility and anger into socially appropriate behaviors will improve Outcome: Progressing Goal: Ability to remain free from injury will improve Outcome:  Progressing

## 2019-09-09 NOTE — Progress Notes (Signed)
[facility] MD Progress Note  09/09/2019 9:03 AM Autumn [name]  MRN:  [id number] Subjective:   Patient is seen on morning rounds she is cordial and appropriate she has several specific requests-firstly she states "cannot go home and go to a shelter" stating "you can use the bed for someone else" she further asks for more is "sleeping meds" states she cannot sleep.  She denies manic symptoms such as racing thoughts or feeling hyperactivity, she denies auditory or visual hallucinations or thoughts of harming self or others.  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  Principal Problem: Bipolar 1 disorder, manic, moderate (HCC) Diagnosis: Principal Problem:   Bipolar 1 disorder, manic, moderate (HCC) Active Problems:   Alcohol abuse  Total Time spent with patient: 20 minutes  Past Psychiatric History: Bipolar disorder  Past Medical History:  Past Medical History:  Diagnosis Date  . Allergy    Seasonal, Ultram (seizures)  . Anemia 2005  . Bipolar 1 disorder ([place])   . Glaucoma   . Seizures ([place]) 2008    Past Surgical History:  Procedure Laterality Date  . BREAST BIOPSY Right    x 2  . CESAREAN SECTION     x 2  . LEEP  2000   Family History:  Family History  Problem Relation Age of Onset  . Hypertension Mother   . Hyperlipidemia Mother   . Asthma Mother   . Parkinson's disease Father   . Lung cancer Maternal Grandmother   . Other Paternal Grandfather 52       Cardiac arrest  . Heart disease Paternal Grandfather    Family Psychiatric  History: No new data Social History:  Social History   Substance and Sexual Activity  Alcohol Use Yes  .  Alcohol/week: 4.0 standard drinks  . Types: 4 Cans of beer per week   Comment: Ocassional     Social History   Substance and Sexual Activity  Drug Use Yes  . Types: Marijuana   Comment: percocet -last use over 1 week ago. Heroin-last used 2/11    Social History   Socioeconomic History  . Marital status: Single    Spouse name: Not on file  . Number of children: Not on file  . Years of education: Not on file  . Highest education level: Not on file  Occupational History  . Not on file  Social Needs  . Financial resource strain: Not on file  . Food insecurity    Worry: Not on file    Inability: Not on file  . Transportation needs    Medical: Not on file    Non-medical: Not on file  Tobacco Use  . Smoking status: Current Every Day Smoker    Packs/day: 1.00    Years: 9.00    Pack years: 9.00  . Smokeless tobacco: Never Used  . Tobacco comment: Patient not interested  Substance and Sexual Activity  . Alcohol use: Yes    Alcohol/week: 4.0 standard drinks    Types: 4 Cans of beer per week    Comment: Ocassional  . Drug use: Yes    Types: Marijuana    Comment: percocet -last  use over 1 week ago. Heroin-last used 2/11  . Sexual activity: Yes    Birth control/protection: None  Lifestyle  . Physical activity    Days per week: Not on file    Minutes per session: Not on file  . Stress: Not on file  Relationships  . Social Herbalist on phone: Not on file    Gets together: Not on file    Attends religious service: Not on file    Active member of club or organization: Not on file    Attends meetings of clubs or organizations: Not on file    Relationship status: Not on file  Other Topics Concern  . Not on file  Social History Narrative  . Not on file   Additional Social History:                         Sleep: Poor  Appetite:  Fair  Current Medications: Current Facility-Administered Medications  Medication Dose Route Frequency Provider Last  Rate Last Dose  . acetaminophen (TYLENOL) tablet 650 mg  650 mg Oral Q6H PRN Caroline Sauger, NP   650 mg at 09/08/19 2357  . alum & mag hydroxide-simeth (MAALOX/MYLANTA) 200-200-20 MG/5ML suspension 30 mL  30 mL Oral Q4H PRN Caroline Sauger, NP      . ARIPiprazole (ABILIFY) tablet 20 mg  20 mg Oral Daily Caroline Sauger, NP   20 mg at 09/09/19 0800  . divalproex (DEPAKOTE) DR tablet 500 mg  500 mg Oral Q12H Caroline Sauger, NP   500 mg at 09/09/19 0800  . feeding supplement (ENSURE ENLIVE) (ENSURE ENLIVE) liquid 237 mL  237 mL Oral BID BM Money, Darnelle Maffucci B, FNP   237 mL at 09/08/19 1400  . hydrOXYzine (ATARAX/VISTARIL) tablet 25 mg  25 mg Oral TID PRN Caroline Sauger, NP   25 mg at 09/08/19 1957  . ibuprofen (ADVIL) tablet 600 mg  600 mg Oral Q6H PRN Caroline Sauger, NP   600 mg at 09/09/19 0759  . lithium carbonate (ESKALITH) CR tablet 450 mg  450 mg Oral Q12H Caroline Sauger, NP   450 mg at 09/09/19 0759  . magnesium hydroxide (MILK OF MAGNESIA) suspension 30 mL  30 mL Oral Daily PRN Caroline Sauger, NP   30 mL at 09/07/19 1516  . nicotine (NICODERM CQ - dosed in mg/24 hours) patch 21 mg  21 mg Transdermal Daily Clapacs, [name], MD   21 mg at 09/09/19 0800  . Norgestimate-Ethinyl Estradiol Triphasic 0.18/0.215/0.25 MG-35 MCG tablet 1 tablet  1 tablet Oral Daily Caroline Sauger, NP      . traZODone (DESYREL) tablet 100 mg  100 mg Oral QHS PRN Money, Lowry Ram, FNP   100 mg at 09/08/19 2120    Lab Results: No results found for this or any previous visit (from the past 48 hour(s)).  Blood Alcohol level:  Lab Results  Component Value Date   ETH <10 08/30/2019   ETH <10 0000000    Metabolic Disorder Labs: Lab Results  Component Value Date   HGBA1C 5.0 11/21/2018   MPG 96.8 11/21/2018   MPG 93.93 04/13/2018   No results found for: PROLACTIN Lab Results  Component Value Date   CHOL 196 11/21/2018   TRIG 97 11/21/2018   HDL 53 11/21/2018    CHOLHDL 3.7 11/21/2018   VLDL 19 11/21/2018   LDLCALC 124 (H) 11/21/2018   LDLCALC 117 (H) 04/13/2018    Physical Findings: AIMS:  , ,  ,  ,  0 CIWA:    0COWS:   0  Musculoskeletal: Strength & Muscle Tone: within normal limits Gait & Station: normal Patient leans: N/A  Psychiatric Specialty Exam: Physical Exam  ROS  Blood pressure 90/70, pulse 63, temperature 97.8 F (36.6 C), temperature source Oral, resp. rate 18, height 5\' 1"  (1.549 m), weight 47.6 kg, last menstrual period 08/21/2019, SpO2 100 %.Body mass index is 19.84 kg/m.  General Appearance: Generally normal/casual  Eye Contact:  Good  Speech:  Normal Rate  Volume:  Decreased  Mood:  Dysphoric  Affect:  Flat  Thought Process:  Linear and Descriptions of Associations: Circumstantial  Orientation:  Full (Time, Place, and Person)  Thought Content:  Logical  Suicidal Thoughts:  No  Homicidal Thoughts:  No  Memory:  Immediate;   Poor Recent;   Fair Remote;   Fair  Judgement:  Fair  Insight:  Fair  Psychomotor Activity:  Normal  Concentration:  Concentration: Fair and Attention Span: Fair  Recall:  [employer] of Knowledge:  Fair  Language:  Fair  Akathisia:  neg  Handed:  Right  AIMS (if indicated):     Assets:  Communication Skills Desire for Improvement  ADL's:  Intact  Cognition:  WNL  Sleep:  Number of Hours: 6     Treatment Plan Summary: Daily contact with patient to assess and evaluate symptoms and progress in treatment and Medication management  For bipolar affective disorder continue current combination of Depakote, lithium, and aripiprazole oral, for insomnia escalate trazodone to 150 at bedtime and make it a standing rather than as needed order.  No change in precautions continue med and illness education standard warnings given  Johnn Hai, MD 09/09/2019, 9:03 AM

## 2019-09-09 NOTE — Progress Notes (Signed)
Patient has been present in the milieu since after dinner and stated that she is trying to stay up in the dayroom and not go back into her room to be depressed.

## 2019-09-10 MED ORDER — DIVALPROEX SODIUM 500 MG PO DR TAB: 500.0000 mg | DELAYED_RELEASE_TABLET | Freq: Two times a day (BID) | ORAL | 0 refills | Status: DC

## 2019-09-10 MED ORDER — NORGESTIM-ETH ESTRAD TRIPHASIC 0.18/0.215/0.25 MG-35 MCG PO TABS: 1.0000 | ORAL_TABLET | Freq: Every day | ORAL | 1 refills | Status: DC

## 2019-09-10 MED ORDER — ARIPIPRAZOLE 20 MG PO TABS: 20.0000 mg | ORAL_TABLET | Freq: Every day | ORAL | 1 refills | Status: DC

## 2019-09-10 MED ORDER — DIVALPROEX SODIUM 500 MG PO DR TAB: 500.0000 mg | DELAYED_RELEASE_TABLET | Freq: Two times a day (BID) | ORAL | 1 refills | Status: DC

## 2019-09-10 MED ORDER — HYDROXYZINE HCL 25 MG PO TABS: 25.0000 mg | ORAL_TABLET | Freq: Three times a day (TID) | ORAL | 1 refills | Status: DC | PRN

## 2019-09-10 MED ORDER — ARIPIPRAZOLE 20 MG PO TABS: 20.0000 mg | ORAL_TABLET | Freq: Every day | ORAL | 0 refills | Status: DC

## 2019-09-10 MED ORDER — HYDROXYZINE HCL 25 MG PO TABS: 25.0000 mg | ORAL_TABLET | Freq: Three times a day (TID) | ORAL | 0 refills | Status: DC | PRN

## 2019-09-10 MED ORDER — TRAZODONE HCL 100 MG PO TABS: 200.0000 mg | ORAL_TABLET | Freq: Every day | ORAL | 1 refills | Status: DC

## 2019-09-10 MED ORDER — LITHIUM CARBONATE ER 450 MG PO TBCR: 450.0000 mg | EXTENDED_RELEASE_TABLET | Freq: Two times a day (BID) | ORAL | 0 refills | Status: DC

## 2019-09-10 MED ORDER — LITHIUM CARBONATE ER 450 MG PO TBCR: 450.0000 mg | EXTENDED_RELEASE_TABLET | Freq: Two times a day (BID) | ORAL | 1 refills | Status: DC

## 2019-09-10 MED ORDER — TRAZODONE HCL 100 MG PO TABS: 200.0000 mg | ORAL_TABLET | Freq: Every day | ORAL | 0 refills | Status: DC

## 2019-09-10 MED ORDER — NORGESTIM-ETH ESTRAD TRIPHASIC 0.18/0.215/0.25 MG-35 MCG PO TABS: 1.0000 | ORAL_TABLET | Freq: Every day | ORAL | 0 refills | Status: DC

## 2019-09-10 MED ORDER — TRAZODONE HCL 100 MG PO TABS: 200.0000 mg | ORAL_TABLET | Freq: Every day | ORAL | Status: DC

## 2019-09-10 NOTE — Plan of Care (Signed)
  Problem: Education: Goal: Knowledge of [place] [facility] Education information/materials will improve Outcome: Progressing Goal: Emotional status will improve Outcome: Progressing Goal: Mental status will improve Outcome: Progressing Goal: Verbalization of understanding the information provided will improve Outcome: Progressing   D: Patient has been calm and cooperative. Out of room more. Denies SI, HI and AVH. Contracts for safety A: continue to monitor for safety R: Safety maintained.

## 2019-09-10 NOTE — Plan of Care (Signed)
  Problem: Group Participation Goal: STG - Patient will engage in groups without prompting or encouragement from LRT x3 group sessions within 5 recreation therapy group sessions Description: STG - Patient will engage in groups without prompting or encouragement from LRT x3 group sessions within 5 recreation therapy group sessions 09/10/2019 1219 by Ernest Haber, LRT Outcome: Not Applicable 11/14/7937 0300 by Ernest Haber, LRT Outcome: Not Met (add Reason) Note: Patient did not attend any groups.

## 2019-09-10 NOTE — BHH Counselor (Signed)
CSW provided patien's nurse with a print out of patients phone and facesheet to support admission to [facility].  Assunta Curtis, MSW, LCSW 09/10/2019 11:11 AM

## 2019-09-10 NOTE — Progress Notes (Signed)
Recreation Therapy Notes  INPATIENT RECREATION TR PLAN  Patient Details Name: Autumn [name] MRN: [id number] DOB: [birth year] Today's Date: 09/10/2019  Rec Therapy Plan Is patient appropriate for Therapeutic Recreation?: Yes Treatment times per week: at least 3 Estimated Length of Stay: 5-7 days TR Treatment/Interventions: Group participation (Comment)  Discharge Criteria Pt will be discharged from therapy if:: Discharged Treatment plan/goals/alternatives discussed and agreed upon by:: Patient/family  Discharge Summary Short term goals set: Patient will engage in groups without prompting or encouragement from LRT x3 group sessions within 5 recreation therapy group sessions Short term goals met: Not met Reason goals not met: Patient did not attend any groups Therapeutic equipment acquired: N/A Reason patient discharged from therapy: Discharge from hospital Pt/family agrees with progress & goals achieved: Yes Date patient discharged from therapy: 09/10/19   Ishaq Maffei 09/10/2019, 12:21 PM

## 2019-09-10 NOTE — Progress Notes (Signed)
Recreation Therapy Notes  Date: 09/10/2019  Time: 9:30 am   Location: Craft room   Behavioral response: N/A   Intervention Topic: Anger Management  Discussion/Intervention: Patient did not attend group.   Clinical Observations/Feedback:  Patient did not attend group.   Randy Whitener LRT/CTRS        Jenniferlynn Saad 09/10/2019 12:07 PM

## 2019-09-10 NOTE — BHH Counselor (Signed)
CSW spoke with the patient regarding her court date on 09/13/2019.  Patient reports that she wants to go to [facility] and will have her boyfriend provide transportation to court.    Assunta Curtis, MSW, LCSW 09/10/2019 9:45 AM

## 2019-09-10 NOTE — Discharge Summary (Signed)
Physician Discharge Summary Note  Patient:  Autumn [name] is an 43 y.o., female MRN:  [id number] DOB:  02/07/1976 Patient phone:  [phone] (home)  Patient address:   [place] [address],  Total Time spent with patient: 30 minutes  Date of Admission:  08/31/2019 Date of Discharge: 09/10/19  Reason for Admission:  Patient well-known to our service with chronic psychotic symptoms chronic noncompliance with treatment.  She evidently was picked up by police after they were called because she was ringing people's doorbells in the middle of the night and acting bizarrely.  Patient says that she was engaging in "self injurious behavior".  I asked her to tell me what that was and she said it was walking around without any shoes on.  She still seems confused and disorganized.  She tells me that she has been drinking but cannot tell me anything about how much and there is no real evidence that she has been drinking.  She definitely has not been taking any of her psychiatric medicines appropriately recently and cannot remember where she has been living.  Principal Problem: Bipolar 1 disorder, manic, moderate ([place]) Discharge Diagnoses: Principal Problem:   Bipolar 1 disorder, manic, moderate ([facility]) Active Problems:   Alcohol abuse   Past Psychiatric History: Patient has a history of bipolar disorder with frequent presentations to the hospital psychotic sometimes agitated sometimes depressed.  Has made suicidal threats in the past.  Has had substance abuse problems occasionally in the past but most recently seems to have not been having much substance abuse issue included.  Frequently noncompliant with medication.  Last time she was here she was supposedly living with her boyfriend and I think that is where she was going back.  She tells me that person is no longer in her life.  She is a extremely poor historian at this point  Past Medical History:  Past Medical History:  Diagnosis Date   . Allergy    Seasonal, Ultram (seizures)  . Anemia 2005  . Bipolar 1 disorder ([place])   . Glaucoma   . Seizures ([place]) 2008    Past Surgical History:  Procedure Laterality Date  . BREAST BIOPSY Right    x 2  . CESAREAN SECTION     x 2  . LEEP  2000   Family History:  Family History  Problem Relation Age of Onset  . Hypertension Mother   . Hyperlipidemia Mother   . Asthma Mother   . Parkinson's disease Father   . Lung cancer Maternal Grandmother   . Other Paternal Grandfather 56       Cardiac arrest  . Heart disease Paternal Grandfather    Family Psychiatric  History: None known Social History:  Social History   Substance and Sexual Activity  Alcohol Use Yes  . Alcohol/week: 4.0 standard drinks  . Types: 4 Cans of beer per week   Comment: Ocassional     Social History   Substance and Sexual Activity  Drug Use Yes  . Types: Marijuana   Comment: percocet -last use over 1 week ago. Heroin-last used 2/11    Social History   Socioeconomic History  . Marital status: Single    Spouse name: Not on file  . Number of children: Not on file  . Years of education: Not on file  . Highest education level: Not on file  Occupational History  . Not on file  Social Needs  . Financial resource strain: Not on file  . Food  insecurity    Worry: Not on file    Inability: Not on file  . Transportation needs    Medical: Not on file    Non-medical: Not on file  Tobacco Use  . Smoking status: Current Every Day Smoker    Packs/day: 1.00    Years: 9.00    Pack years: 9.00  . Smokeless tobacco: Never Used  . Tobacco comment: Patient not interested  Substance and Sexual Activity  . Alcohol use: Yes    Alcohol/week: 4.0 standard drinks    Types: 4 Cans of beer per week    Comment: Ocassional  . Drug use: Yes    Types: Marijuana    Comment: percocet -last use over 1 week ago. Heroin-last used 2/11  . Sexual activity: Yes    Birth control/protection: None  Lifestyle  .  Physical activity    Days per week: Not on file    Minutes per session: Not on file  . Stress: Not on file  Relationships  . Social Herbalist on phone: Not on file    Gets together: Not on file    Attends religious service: Not on file    Active member of club or organization: Not on file    Attends meetings of clubs or organizations: Not on file    Relationship status: Not on file  Other Topics Concern  . Not on file  Social History Narrative  . Not on file    Hospital Course:  Patient remained on the [facility] unit for 10 days. The patient stabilized on medication and therapy. Patient was discharged on Abilify 20 mg PO Daily, Depakote DR 500 mg Q12H, Vistaril 25 mg PO TID PRN, Eskalith 450 mg PO Q12H, and Trazodone 200 mg PO QHS. Patient has shown improvement with improved mood, affect, sleep, appetite, and interaction. Patient has attended group and participated. Patient has been seen in the day room interacting with peers and staff appropriately. Patient denies any SI/HI/AVH and contracts for safety. Patient agrees to follow up at [facility]. Patient is provided with prescriptions for their medications upon discharge.  After patient was restarted on her medication she showed stabilization and was calm and cooperative on the unit.  Patient did find out that she could not return to live with her boyfriend because she had a restraining order until December 1.  Patient did agree to follow-up at [facility] and to stay at [place] for housing.  Patient will be provided with assistance for transportation to get to the [facility] rescue mission.  Patient does state concerns about being in quarantine and was provided with 14-day samples for quarantine.  She is also concerned with court dates that she has upcoming and she is instructed to contact the court house to notify them of where she is going to and being placed under quarantine if they are going to interfere with her  court dates.  Physical Findings: AIMS:  , ,  ,  ,    CIWA:    COWS:     Musculoskeletal: Strength & Muscle Tone: within normal limits Gait & Station: normal Patient leans: N/A  Psychiatric Specialty Exam: Physical Exam  Nursing note and vitals reviewed. Constitutional: She is oriented to person, place, and time. She appears well-developed and well-nourished.  Cardiovascular: Normal rate.  Respiratory: Effort normal.  Musculoskeletal: Normal range of motion.  Neurological: She is alert and oriented to person, place, and time.  Skin: Skin is warm.  Review of Systems  Constitutional: Negative.   HENT: Negative.   Eyes: Negative.   Respiratory: Negative.   Cardiovascular: Negative.   Gastrointestinal: Negative.   Genitourinary: Negative.   Musculoskeletal: Negative.   Skin: Negative.   Neurological: Negative.   Endo/Heme/Allergies: Negative.   Psychiatric/Behavioral: Negative.     Blood pressure 92/60, pulse 70, temperature 98.2 F (36.8 C), resp. rate 16, height 5\' 1"  (1.549 m), weight 47.6 kg, last menstrual period 08/21/2019, SpO2 98 %.Body mass index is 19.84 kg/m.  General Appearance: Casual and Fairly Groomed  Eye Contact:  Good  Speech:  Clear and Coherent and Normal Rate  Volume:  Normal  Mood:  Euthymic  Affect:  Congruent  Thought Process:  Coherent and Descriptions of Associations: Intact  Orientation:  Full (Time, Place, and Person)  Thought Content:  WDL  Suicidal Thoughts:  No  Homicidal Thoughts:  No  Memory:  Immediate;   Good Recent;   Good Remote;   Good  Judgement:  Fair  Insight:  Fair  Psychomotor Activity:  Normal  Concentration:  Concentration: Good  Recall:  Good  Fund of Knowledge:  Good  Language:  Good  Akathisia:  No  Handed:  Right  AIMS (if indicated):     Assets:  Communication Skills Desire for Improvement Housing Resilience Social Support  ADL's:  Intact  Cognition:  WNL  Sleep:  Number of Hours: 5.75     Have  you used any form of tobacco in the last 30 days? (Cigarettes, Smokeless Tobacco, Cigars, and/or Pipes): Yes  Has this patient used any form of tobacco in the last 30 days? (Cigarettes, Smokeless Tobacco, Cigars, and/or Pipes) Yes, Yes, A prescription for an FDA-approved tobacco cessation medication was offered at discharge and the patient refused  Blood Alcohol level:  Lab Results  Component Value Date   [facility] <10 08/30/2019   ETH <10 0000000    Metabolic Disorder Labs:  Lab Results  Component Value Date   HGBA1C 5.0 11/21/2018   MPG 96.8 11/21/2018   MPG 93.93 04/13/2018   No results found for: PROLACTIN Lab Results  Component Value Date   CHOL 196 11/21/2018   TRIG 97 11/21/2018   HDL 53 11/21/2018   CHOLHDL 3.7 11/21/2018   VLDL 19 11/21/2018   LDLCALC 124 (H) 11/21/2018   LDLCALC 117 (H) 04/13/2018    See Psychiatric Specialty Exam and Suicide Risk Assessment completed by Attending Physician prior to discharge.  Discharge destination:  Home  Is patient on multiple antipsychotic therapies at discharge:  No   Has Patient had three or more failed trials of antipsychotic monotherapy by history:  No  Recommended Plan for Multiple Antipsychotic Therapies: NA  Discharge Instructions    Diet - low sodium heart healthy   Complete by: As directed    Increase activity slowly   Complete by: As directed      Allergies as of 09/10/2019      Reactions   Haldol [haloperidol Lactate]    Patient states that she gets stiff muscles when taking Haldol   Ultram [tramadol] Other (See Comments)   Seizures      Medication List    STOP taking these medications   amoxicillin 500 MG capsule Commonly known as: AMOXIL   lidocaine 2 % solution Commonly known as: XYLOCAINE     TAKE these medications     Indication  ARIPiprazole 20 MG tablet Commonly known as: ABILIFY Take 1 tablet (20 mg total) by mouth daily.  Indication: MIXED BIPOLAR AFFECTIVE DISORDER   divalproex 500  MG DR tablet Commonly known as: DEPAKOTE Take 1 tablet (500 mg total) by mouth every 12 (twelve) hours.  Indication: Depressive Phase of Manic-Depression, mood stability   hydrOXYzine 25 MG tablet Commonly known as: ATARAX/VISTARIL Take 1 tablet (25 mg total) by mouth 3 (three) times daily as needed for anxiety.  Indication: Feeling Anxious   lithium carbonate 450 MG CR tablet Commonly known as: ESKALITH Take 1 tablet (450 mg total) by mouth every 12 (twelve) hours.  Indication: Hypomanic Episode of Bipolar Disorder, Major Depressive Disorder   Norgestimate-Ethinyl Estradiol Triphasic 0.18/0.215/0.25 MG-35 MCG tablet Commonly known as: Ortho Tri-Cyclen (28) Take 1 tablet by mouth daily.  Indication: Dysfunctional Bleeding From the Uterus   traZODone 100 MG tablet Commonly known as: DESYREL Take 2 tablets (200 mg total) by mouth at bedtime.  Indication: [facility] Follow up.   Specialty: Behavioral Health Why: Walk in hours are Monday through Friday at 8:30AM, it is first come first serve. Contact information: [place] [phone]          Follow-up recommendations:  Continue activity as tolerated. Continue diet as recommended by your PCP. Ensure to keep all appointments with outpatient providers.  Comments:  Patient is instructed prior to discharge to: Take all medications as prescribed by his/her mental healthcare provider. Report any adverse effects and or reactions from the medicines to his/her outpatient provider promptly. Patient has been instructed & cautioned: To not engage in alcohol and or illegal drug use while on prescription medicines. In the event of worsening symptoms, patient is instructed to call the crisis hotline, 911 and or go to the nearest ED for appropriate evaluation and treatment of symptoms. To follow-up with his/her primary  care provider for your other medical issues, concerns and or health care needs.    Signed: [place], FNP 09/10/2019, 10:28 AM

## 2019-09-10 NOTE — BHH Suicide Risk Assessment (Signed)
[facility] Discharge Suicide Risk Assessment   Principal Problem: Bipolar 1 disorder, manic, moderate ([place]) Discharge Diagnoses: Principal Problem:   Bipolar 1 disorder, manic, moderate ([facility]) Active Problems:   Alcohol abuse   Total Time spent with patient: 30 minutes  Musculoskeletal: Strength & Muscle Tone: within normal limits Gait & Station: normal Patient leans: N/A  Psychiatric Specialty Exam: Review of Systems  Constitutional: Negative.   HENT: Negative.   Eyes: Negative.   Respiratory: Negative.   Cardiovascular: Negative.   Gastrointestinal: Negative.   Musculoskeletal: Negative.   Skin: Negative.   Neurological: Negative.   Psychiatric/Behavioral: Negative.     Blood pressure 92/60, pulse 70, temperature 98.2 F (36.8 C), resp. rate 16, height 5\' 1"  (1.549 m), weight 47.6 kg, last menstrual period 08/21/2019, SpO2 98 %.Body mass index is 19.84 kg/m.  General Appearance: Casual  Eye Contact::  Good  Speech:  Clear and [id number]  Volume:  Normal  Mood:  Euthymic  Affect:  Congruent  Thought Process:  Goal Directed  Orientation:  Full (Time, Place, and Person)  Thought Content:  Logical  Suicidal Thoughts:  No  Homicidal Thoughts:  No  Memory:  Immediate;   Fair Recent;   Fair Remote;   Fair  Judgement:  Fair  Insight:  Fair  Psychomotor Activity:  Normal  Concentration:  Fair  Recall:  [employer] of Knowledge:Fair  Language: Fair  Akathisia:  No  Handed:  Right  AIMS (if indicated):     Assets:  Desire for Improvement Physical Health Resilience  Sleep:  Number of Hours: 5.75  Cognition: WNL  ADL's:  Intact   Mental Status Per Nursing Assessment::   On Admission:  NA  Demographic Factors:  Caucasian, Low socioeconomic status and Living alone  Loss Factors: Financial problems/change in socioeconomic status  Historical Factors: Impulsivity  Risk Reduction Factors:   Religious beliefs about death and Positive coping skills or problem solving  skills  Continued Clinical Symptoms:  Bipolar Disorder:   Mixed State Alcohol/Substance Abuse/Dependencies Schizophrenia:   Paranoid or undifferentiated type  Cognitive Features That Contribute To Risk:  Polarized thinking    Suicide Risk:  Minimal: No identifiable suicidal ideation.  Patients presenting with no risk factors but with morbid ruminations; may be classified as minimal risk based on the severity of the depressive symptoms  Follow-up [facility] Follow up.   Specialty: Behavioral Health Why: Walk in hours are Monday through Friday at 8:30AM, it is first come first serve. Contact information: [place] [phone]          Plan Of Care/Follow-up recommendations:  Activity:  Activity as tolerated Diet:  Regular diet Other:  Being referred to [facility] as she is going to the [place] rescue mission.  Prescriptions provided at discharge.  Alethia Berthold, MD 09/10/2019, 11:33 AM

## 2019-09-10 NOTE — Progress Notes (Signed)
Patient ID: Autumn [name], female   DOB: June 24, 1976, 43 y.o.   MRN: [id number]   Discharge Note:  Patient denies SI/HI/AVH at this time. Discharge instructions, AVS, prescriptions, transition record and seven-day supply gone over with patient. Patient agrees to comply with medication management, follow-up visit, and outpatient therapy. Patient belongings returned to patient. Patient questions and concerns addressed and answered. Patient ambulatory off unit. Patient discharged to [facility] via [employer] services.

## 2019-09-10 NOTE — Progress Notes (Signed)
  [facility] Adult Case Management Discharge Plan :  Will you be returning to the same living situation after discharge:  No. Patient discharging to [facility]. At discharge, do you have transportation home?: Yes,  CSW will provide with a taxi voucher.. Do you have the ability to pay for your medications: No.  Release of information consent forms completed and in the chart;  [name]  Patient to Follow up at: Follow-up Information    [facility] Follow up.   Specialty: Behavioral Health Why: Walk in hours are Monday through Friday at 8:30AM, it is first come first serve. Contact information: [place] [phone]          Next level of care provider has access to [facility] and Suicide Prevention discussed: No.  Patient declined.   Have you used any form of tobacco in the last 30 days? (Cigarettes, Smokeless Tobacco, Cigars, and/or Pipes): Yes  Has patient been referred to the Quitline?: Patient refused referral  Patient has been referred for addiction treatment: Pt. refused referral  Rozann Lesches, LCSW 09/10/2019, 9:48 AM

## 2019-09-10 NOTE — Progress Notes (Signed)
D: Patient has been calm and cooperative. Out of room more. Denies SI, HI and AVH. Contracts for safety A: continue to monitor for safety R: Safety maintained.

## 2019-11-15 ENCOUNTER — Telehealth: Payer: Self-pay | Admitting: Pharmacy Technician

## 2019-11-15 NOTE — Telephone Encounter (Signed)
Patient failed to provide requested 2020 financial documentation. No additional medication assistance will be provided by [facility] without the required proof of income documentation. Patient notified by letter  Vaidehi Braddy CPhT [facility] 

## 2020-05-30 ENCOUNTER — Ambulatory Visit (INDEPENDENT_AMBULATORY_CARE_PROVIDER_SITE_OTHER): Payer: Medicaid Other | Admitting: Advanced Practice Midwife

## 2020-05-30 ENCOUNTER — Encounter: Payer: Self-pay | Admitting: Advanced Practice Midwife

## 2020-05-30 ENCOUNTER — Other Ambulatory Visit: Payer: Self-pay

## 2020-05-30 ENCOUNTER — Other Ambulatory Visit (HOSPITAL_COMMUNITY)
Admission: RE | Admit: 2020-05-30 | Discharge: 2020-05-30 | Disposition: A | Discharge status: Home / Self Care | Payer: Medicaid Other | Source: Ambulatory Visit | Attending: Advanced Practice Midwife | Admitting: Advanced Practice Midwife

## 2020-05-30 VITALS — BP 98/68 | Ht 61.0 in | Wt 113.6 lb

## 2020-05-30 DIAGNOSIS — Z01419 Encounter for gynecological examination (general) (routine) without abnormal findings: Secondary | ICD-10-CM

## 2020-05-30 DIAGNOSIS — Z30013 Encounter for initial prescription of injectable contraceptive: Secondary | ICD-10-CM

## 2020-05-30 DIAGNOSIS — Z124 Encounter for screening for malignant neoplasm of cervix: Secondary | ICD-10-CM | POA: Diagnosis present

## 2020-05-30 DIAGNOSIS — Z113 Encounter for screening for infections with a predominantly sexual mode of transmission: Secondary | ICD-10-CM | POA: Diagnosis present

## 2020-05-30 DIAGNOSIS — Z1239 Encounter for other screening for malignant neoplasm of breast: Secondary | ICD-10-CM

## 2020-05-30 LAB — POCT URINE PREGNANCY: Preg Test, Ur: NEGATIVE

## 2020-05-30 MED ORDER — MEDROXYPROGESTERONE ACETATE 150 MG/ML IM SUSP: 150.0000 mg | INTRAMUSCULAR | 3 refills | Status: DC

## 2020-05-30 NOTE — Patient Instructions (Signed)
Menorrhagia  Menorrhagia is a condition in which menstrual periods are heavy or last longer than normal. With menorrhagia, most periods a woman has may cause enough blood loss and cramping that she becomes unable to take part in her usual activities. What are the causes? Common causes of this condition include:  Noncancerous growths in the uterus (polyps or fibroids).  An imbalance of the estrogen and progesterone hormones.  One of the ovaries not releasing an egg during one or more months.  A problem with the thyroid gland (hypothyroid).  Side effects of having an intrauterine device (IUD).  Side effects of some medicines, such as anti-inflammatory medicines or blood thinners.  A bleeding disorder that stops the blood from clotting normally. In some cases, the cause of this condition is not known. What are the signs or symptoms? Symptoms of this condition include:  Routinely having to change your pad or tampon every 1-2 hours because it is completely soaked.  Needing to use pads and tampons at the same time because of heavy bleeding.  Needing to wake up to change your pads or tampons during the night.  Passing blood clots larger than 1 inch (2.5 cm) in size.  Having bleeding that lasts for more than 7 days.  Having symptoms of low iron levels (anemia), such as tiredness, fatigue, or shortness of breath. How is this diagnosed? This condition may be diagnosed based on:  A physical exam.  Your symptoms and menstrual history.  Tests, such as: ? Blood tests to check if you are pregnant or have hormonal changes, a bleeding or thyroid disorder, anemia, or other problems. ? Pap test to check for cancerous changes, infections, or inflammation. ? Endometrial biopsy. This test involves removing a tissue sample from the lining of the uterus (endometrium) to be examined under a microscope. ? Pelvic ultrasound. This test uses sound waves to create images of your uterus, ovaries, and  vagina. The images can show if you have fibroids or other growths. ? Hysteroscopy. For this test, a small telescope is used to look inside your uterus. How is this treated? Treatment may not be needed for this condition. If it is needed, the best treatment for you will depend on:  Whether you need to prevent pregnancy.  Your desire to have children in the future.  The cause and severity of your bleeding.  Your personal preference. Medicines are the first step in treatment. You may be treated with:  Hormonal birth control methods. These treatments reduce bleeding during your menstrual period. They include: ? Birth control pills. ? Skin patch. ? Vaginal ring. ? Shots (injections) that you get every 3 months. ? Hormonal IUD (intrauterine device). ? Implants that go under the skin.  Medicines that thicken blood and slow bleeding.  Medicines that reduce swelling, such as ibuprofen.  Medicines that contain an artificial (synthetic) hormone called progestin.  Medicines that make the ovaries stop working for a short time.  Iron supplements to treat anemia. If medicines do not work, surgery may be done. Surgical options may include:  Dilation and curettage (D&C). In this procedure, your health care provider opens (dilates) your cervix and then scrapes or suctions tissue from the endometrium to reduce menstrual bleeding.  Operative hysteroscopy. In this procedure, a small tube with a light on the end (hysteroscope) is used to view your uterus and help remove polyps that may be causing heavy periods.  Endometrial ablation. This is when various techniques are used to permanently destroy your entire endometrium.  After endometrial ablation, most women have little or no menstrual flow. This procedure reduces your ability to become pregnant.  Endometrial resection. In this procedure, an electrosurgical wire loop is used to remove the endometrium. This procedure reduces your ability to become  pregnant.  Hysterectomy. This is surgical removal of the uterus. This is a permanent procedure that stops menstrual periods. Pregnancy is not possible after a hysterectomy. Follow these instructions at home: Medicines  Take over-the-counter and prescription medicines exactly as told by your health care provider. This includes iron pills.  Do not change or switch medicines without asking your health care provider.  Do not take aspirin or medicines that contain aspirin 1 week before or during your menstrual period. Aspirin may make bleeding worse. General instructions  If you need to change your sanitary pad or tampon more than once every 2 hours, limit your activity until the bleeding stops.  Iron pills can cause constipation. To prevent or treat constipation while you are taking prescription iron supplements, your health care provider may recommend that you: ? Drink enough fluid to keep your urine clear or pale yellow. ? Take over-the-counter or prescription medicines. ? Eat foods that are high in fiber, such as fresh fruits and vegetables, whole grains, and beans. ? Limit foods that are high in fat and processed sugars, such as fried and sweet foods.  Eat well-balanced meals, including foods that are high in iron. Foods that have a lot of iron include leafy green vegetables, meat, liver, eggs, and whole grain breads and cereals.  Do not try to lose weight until the abnormal bleeding has stopped and your blood iron level is back to normal. If you need to lose weight, work with your health care provider to lose weight safely.  Keep all follow-up visits as told by your health care provider. This is important. Contact a health care provider if:  You soak through a pad or tampon every 1 or 2 hours, and this happens every time you have a period.  You need to use pads and tampons at the same time because you are bleeding so much.  You have nausea, vomiting, diarrhea, or other problems  related to medicines you are taking. Get help right away if:  You soak through more than a pad or tampon in 1 hour.  You pass clots bigger than 1 inch (2.5 cm) wide.  You feel short of breath.  You feel like your heart is beating too fast.  You feel dizzy or faint.  You feel very weak or tired. Summary  Menorrhagia is a condition in which menstrual periods are heavy or last longer than normal.  Treatment will depend on the cause of the condition and may include medicines or procedures.  Take over-the-counter and prescription medicines exactly as told by your health care provider. This includes iron pills.  Get help right away if you have heavy bleeding that soaks through more than a pad or tampon in 1 hour, you are passing large clots, or you feel dizzy, faint or short of breath. This information is not intended to replace advice given to you by your health care provider. Make sure you discuss any questions you have with your health care provider. Document Revised: 02/01/2018 Document Reviewed: 10/18/2016 Elsevier Patient Education  2020 [employer]  

## 2020-05-30 NOTE — Progress Notes (Signed)
Gynecology Annual Exam  PCP: Patient, No Pcp Per  Chief Complaint:  Chief Complaint  Patient presents with  . Gynecologic Exam    annual exam    History of Present Illness: Patient is a 44 y.o. No obstetric history on file. presents for annual exam. The patient has no gyn complaints today. She admits breast tenderness and thinks her fibrocystic breast tissue has gotten bigger. She requests pregnancy test today due to unprotected intercourse on July 2nd.  LMP: Patient's last menstrual period was 05/02/2020. Average Interval: regular, 30 days Duration of flow: 5 days Heavy Menses: yes Clots: no Intermenstrual Bleeding: no Postcoital Bleeding: no Dysmenorrhea: no   The patient is sexually active. She currently uses coitus interruptus for contraception. She denies dyspareunia.  The patient does perform self breast exams.  There is no notable family history of breast or ovarian cancer in her family. The patient has a history of fibroadenoma.  The patient wears seatbelts: yes.   The patient has regular exercise: she walks daily. She admits healthy diet and has increased intake of h2o. She admits adequate sleep.    The patient reports current symptoms of depression and is seeing a psychiatrist and taking abilify.    Review of Systems: Review of Systems  Constitutional: Negative for chills and fever.  HENT: Negative for congestion, ear discharge, ear pain, hearing loss, sinus pain and sore throat.   Eyes: Negative for blurred vision and double vision.  Respiratory: Negative for cough, shortness of breath and wheezing.   Cardiovascular: Negative for chest pain, palpitations and leg swelling.  Gastrointestinal: Negative for abdominal pain, blood in stool, constipation, diarrhea, heartburn, melena, nausea and vomiting.  Genitourinary: Negative for dysuria, flank pain, frequency, hematuria and urgency.  Musculoskeletal: Negative for back pain, joint pain and myalgias.  Skin: Negative  for itching and rash.  Neurological: Negative for dizziness, tingling, tremors, sensory change, speech change, focal weakness, seizures, loss of consciousness, weakness and headaches.  Endo/Heme/Allergies: Negative for environmental allergies. Does not bruise/bleed easily.  Psychiatric/Behavioral: Negative for depression, hallucinations, memory loss, substance abuse and suicidal ideas. The patient is not nervous/anxious and does not have insomnia.        Positive anxiety and depression  Breast: Positive lumps and tenderness  Past Medical History:  Patient Active Problem List   Diagnosis Date Noted  . Bipolar 1 disorder, manic, moderate ([place]) 08/31/2019  . Alcohol abuse 08/31/2019  . Bipolar I disorder, most recent episode (or current) manic ([place]) 06/02/2019  . Affective psychosis, bipolar ([place]) 05/25/2019  . Bipolar affective disorder, current episode manic ([place]) 05/20/2019  . Opiate abuse, continuous ([place]) 11/21/2018  . Bipolar I disorder, most recent episode depressed, severe without psychotic features ([place]) 07/05/2017  . Tobacco use disorder 06/27/2017  . 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 Opioid use disorder, severe, dependence ([place]) 06/27/2017  . Cannabis use disorder, severe, dependence ([place]) 06/27/2017  . Hx of glaucoma 10/02/2013    Past Surgical History:  Past Surgical History:  Procedure Laterality Date  . BREAST BIOPSY Right    x 2  . CESAREAN SECTION     x 2  . LEEP  2000    Gynecologic History:  Patient's last menstrual period was 05/02/2020. Contraception: coitus interruptus Last [facility] Results were:  no abnormalities  Last mammogram: unknown Results were: BI-RAD II  Obstetric History: No obstetric history on file.  Family History:  Family History  Problem Relation Age of Onset  .  Hypertension Mother   . Hyperlipidemia Mother   . Asthma Mother   . Parkinson's disease Father   . Lung cancer Maternal  Grandmother   . Other Paternal Grandfather 54       Cardiac arrest  . Heart disease Paternal Grandfather     Social History:  Social History   Socioeconomic History  . Marital status: Single    Spouse name: Not on file  . Number of children: Not on file  . Years of education: Not on file  . Highest education level: Not on file  Occupational History  . Not on file  Tobacco Use  . Smoking status: Current Every Day Smoker    Packs/day: 1.00    Years: 9.00    Pack years: 9.00    Types: E-cigarettes  . Smokeless tobacco: Never Used  Vaping Use  . Vaping Use: Never used  Substance and Sexual Activity  . Alcohol use: Yes    Alcohol/week: 4.0 standard drinks    Types: 4 Cans of beer per week    Comment: Ocassional  . Drug use: Yes    Types: Marijuana    Comment: percocet -last use over 1 week ago. Heroin-last used 2/11  . Sexual activity: Yes    Birth control/protection: None  Other Topics Concern  . Not on file  Social History Narrative  . Not on file   Social Determinants of Health   Financial Resource Strain:   . Difficulty of Paying Living Expenses:   Food Insecurity:   . Worried About Charity fundraiser in the Last Year:   . Arboriculturist in the Last Year:   Transportation Needs:   . Film/video editor (Medical):   Marland Kitchen Lack of Transportation (Non-Medical):   Physical Activity:   . Days of Exercise per Week:   . Minutes of Exercise per Session:   Stress:   . Feeling of Stress :   Social Connections:   . Frequency of Communication with Friends and Family:   . Frequency of Social Gatherings with Friends and Family:   . Attends Religious Services:   . Active Member of Clubs or Organizations:   . Attends Archivist Meetings:   Marland Kitchen Marital Status:   Intimate Partner Violence:   . Fear of Current or Ex-Partner:   . Emotionally Abused:   Marland Kitchen Physically Abused:   . Sexually Abused:     Allergies:  Allergies  Allergen Reactions  . Haldol  [Haloperidol Lactate]     Patient states that she gets stiff muscles when taking Haldol  . Ultram [Tramadol] Other (See Comments)    Seizures    Medications: Prior to Admission medications   Medication Sig Start Date End Date Taking? Authorizing Provider  ARIPiprazole (ABILIFY) 20 MG tablet Take 1 tablet (20 mg total) by mouth daily. 09/10/19   Money, Lowry Ram, FNP  medroxyPROGESTERone (DEPO-PROVERA) 150 MG/ML injection Inject 1 mL (150 mg total) into the muscle every 3 (three) months. 05/30/20   Rod Can, CNM    Physical Exam Vitals: Blood pressure 98/68, height 5\' 1"  (1.549 m), weight 113 lb 9.6 oz (51.5 kg), last menstrual period 05/02/2020.  General: NAD HEENT: normocephalic, anicteric Thyroid: no enlargement, no palpable nodules Pulmonary: No increased work of breathing, CTAB Cardiovascular: RRR, distal pulses 2+ Breast: Breast symmetrical, tenderness to palpation, no palpable nodules or masses, no skin or nipple retraction present, no nipple discharge.  No axillary or supraclavicular lymphadenopathy. Prominent fibrocystic tissue palpated. Abdomen:  NABS, soft, non-tender, non-distended.  Umbilicus without lesions.  No hepatomegaly, splenomegaly or masses palpable. No evidence of hernia  Genitourinary:  External: Normal external female genitalia.  Normal urethral meatus, normal Bartholin's and Skene's glands.    Vagina: Normal vaginal mucosa, no evidence of prolapse.    Cervix: Grossly normal in appearance, no bleeding, no CMT  Uterus: Non-enlarged, mobile, normal contour.    Adnexa: ovaries non-enlarged, no adnexal masses  Rectal: deferred  Lymphatic: no evidence of inguinal lymphadenopathy Extremities: no edema, erythema, or tenderness Neurologic: Grossly intact Psychiatric: mood appropriate, affect full  Urine pregnancy test today is negative  Assessment: 44 y.o. No obstetric history on file. routine annual exam  Plan: Problem List Items Addressed This Visit     None    Visit Diagnoses    Well woman exam with routine gynecological exam    -  Primary   Relevant Orders   HIV Antibody (routine testing w rflx)   RPR Qual   Hepatitis panel, acute   Cytology - PAP   POCT urine pregnancy (Completed)   Encounter for initial prescription of injectable contraceptive       Relevant Medications   medroxyPROGESTERone (DEPO-PROVERA) 150 MG/ML injection   Other Relevant Orders   POCT urine pregnancy (Completed)   Screen for sexually transmitted diseases       Relevant Orders   HIV Antibody (routine testing w rflx)   RPR Qual   Hepatitis panel, acute   Cytology - PAP   POCT urine pregnancy (Completed)   Screening for cervical cancer       Relevant Orders   Cytology - PAP   POCT urine pregnancy (Completed)      1) Mammogram - recommend yearly screening mammogram.  Mammogram Was ordered today   2) STI screening  was offered and accepted  3) ASCCP guidelines and rationale discussed.  Patient opts for every 3 years screening interval. New partner since last year's PAP. PAP today.  4) Contraception - the patient is currently using  coitus interruptus.  She is interested in changing to permanent method  5) Colonoscopy -- Screening recommended starting at age 24 for average risk individuals, age 83 for individuals deemed at increased risk (including African Americans) and recommended to continue until age 81.  For patient age 58-85 individualized approach is recommended.  Gold standard screening is via colonoscopy, Cologuard screening is an acceptable alternative for patient unwilling or unable to undergo colonoscopy.  "Colorectal cancer screening for average?risk adults: 2018 guideline update from the American Cancer Society"CA: A Cancer Journal for Clinicians: Apr 06, 2017   6) Routine healthcare maintenance including cholesterol, diabetes screening discussed Declines  7) Return for schedule MD visit to discuss permanent BC options/ablation heavy  periods.   Rod Can, [facility] 05/30/2020, 3:17 PM

## 2020-05-31 LAB — HEPATITIS PANEL, ACUTE
Hep A IgM: NEGATIVE
Hep B C IgM: NEGATIVE
Hep C Virus Ab: <0.1 s/co ratio (ref 0.0–0.9)
Hepatitis B Surface Ag: NEGATIVE

## 2020-05-31 LAB — HIV ANTIBODY (ROUTINE TESTING W REFLEX): HIV Screen 4th Generation wRfx: NONREACTIVE

## 2020-05-31 LAB — RPR QUALITATIVE: RPR Ser Ql: NONREACTIVE

## 2020-06-02 ENCOUNTER — Ambulatory Visit: Payer: Medicaid Other

## 2020-06-05 ENCOUNTER — Ambulatory Visit: Payer: Medicaid Other

## 2020-06-05 LAB — CYTOLOGY - PAP
Chlamydia: NEGATIVE
Comment: NEGATIVE
Comment: NEGATIVE
Comment: NEGATIVE
Comment: NORMAL
Diagnosis: NEGATIVE
High risk HPV: NEGATIVE
Neisseria Gonorrhea: NEGATIVE
Trichomonas: NEGATIVE

## 2020-06-10 ENCOUNTER — Ambulatory Visit: Payer: Medicaid Other | Admitting: Obstetrics & Gynecology

## 2020-06-25 DIAGNOSIS — Z8659 Personal history of other mental and behavioral disorders: Secondary | ICD-10-CM

## 2020-06-25 DIAGNOSIS — N871 Moderate cervical dysplasia: Secondary | ICD-10-CM

## 2020-08-20 ENCOUNTER — Emergency Department
Admission: EM | Admit: 2020-08-20 | Discharge: 2020-08-20 | Disposition: A | Discharge status: Home / Self Care | Payer: Medicaid Other | Attending: Emergency Medicine | Admitting: Emergency Medicine

## 2020-08-20 ENCOUNTER — Other Ambulatory Visit: Payer: Self-pay

## 2020-08-20 DIAGNOSIS — Z20822 Contact with and (suspected) exposure to covid-19: Secondary | ICD-10-CM | POA: Insufficient documentation

## 2020-08-20 DIAGNOSIS — A084 Viral intestinal infection, unspecified: Secondary | ICD-10-CM | POA: Insufficient documentation

## 2020-08-20 DIAGNOSIS — F17290 Nicotine dependence, other tobacco product, uncomplicated: Secondary | ICD-10-CM | POA: Insufficient documentation

## 2020-08-20 LAB — RESPIRATORY PANEL BY RT PCR (FLU A&B, COVID)
Influenza A by PCR: NEGATIVE
Influenza B by PCR: NEGATIVE
SARS Coronavirus 2 by RT PCR: NEGATIVE

## 2020-08-20 MED ORDER — ONDANSETRON 4 MG PO TBDP
4.0000 mg | ORAL_TABLET | Freq: Once | ORAL | Status: AC
  Administered 2020-08-20: 4 mg via ORAL
  Filled 2020-08-20: qty 1

## 2020-08-20 MED ORDER — ONDANSETRON 4 MG PO TBDP: 4.0000 mg | ORAL_TABLET | Freq: Three times a day (TID) | ORAL | 0 refills | Status: DC | PRN

## 2020-08-20 NOTE — Discharge Instructions (Signed)
Follow-up with your primary care provider if any continued problems or return to the emergency department for any worsening of your symptoms or inability to to keep fluids down.  Zofran was sent to your pharmacy this is 1 on your tongue every 8 hours as needed for nausea.  Drink clear fluids for the next 24 hours.  You may also take Tylenol if needed for body aches, headache or fever.

## 2020-08-20 NOTE — ED Triage Notes (Addendum)
Pt comes via POV from home with c/o N/V for two days. Pt denies any fever.

## 2020-08-20 NOTE — ED Notes (Signed)
See triage note, pt reports N/V/D x 2 days. Denies fevers.  States has not been drinking water, able to keep soda down without difficulty.  Ambulatory to treatment room. NAD noted.

## 2020-08-20 NOTE — ED Provider Notes (Signed)
[facility] Emergency Department Provider Note  ____________________________________________   First MD Initiated Contact with Patient 08/20/20 1003     (approximate)  I have reviewed the triage vital signs and the nursing notes.   HISTORY  Chief Complaint Emesis and Nausea   HPI Autumn [name] is a 44 y.o. female presents to the ED with complaint of nausea, vomiting and diarrhea for the last 2 days.  Patient reports diarrhea x5 and vomiting x5 in the last 24 to 48 hours.  Patient denies any fever or chills.  Patient reports that she is able to keep those down without any difficulty.  She denies any change in taste or smell.  She is concerned about Covid and also states that she cannot return to work until she has a note for a negative Covid test.  She rates her pain as 3 out of 10.         Past Medical History:  Diagnosis Date  . Allergy    Seasonal, Ultram (seizures)  . Anemia 2005  . Bipolar 1 disorder ([place])   . Glaucoma   . Seizures ([place]) 2008    Patient Active Problem List   Diagnosis Date Noted  . Bipolar 1 disorder, manic, moderate ([place]) 08/31/2019  . Alcohol abuse 08/31/2019  . Bipolar I disorder, most recent episode (or current) manic ([place]) 06/02/2019  . Affective psychosis, bipolar ([place]) 05/25/2019  . Bipolar affective disorder, current episode manic ([place]) 05/20/2019  . Opiate abuse, continuous ([place]) 11/21/2018  . Bipolar I disorder, most recent episode depressed, severe without psychotic features ([place]) 07/05/2017  . Tobacco use disorder 06/27/2017  . 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 Opioid use disorder, severe, dependence ([place]) 06/27/2017  . Cannabis use disorder, severe, dependence ([place]) 06/27/2017  . CIN II (cervical intraepithelial neoplasia II) 03/06/2014  . History of anorexia nervosa 03/06/2014  . Hx of glaucoma 10/02/2013    Past Surgical History:  Procedure Laterality Date  . BREAST BIOPSY Right     x 2  . CESAREAN SECTION     x 2  . LEEP  2000    Prior to Admission medications   Medication Sig Start Date End Date Taking? Authorizing Provider  ARIPiprazole (ABILIFY) 20 MG tablet Take 1 tablet (20 mg total) by mouth daily. 09/10/19   Money, Lowry Ram, FNP  medroxyPROGESTERone (DEPO-PROVERA) 150 MG/ML injection Inject 1 mL (150 mg total) into the muscle every 3 (three) months. 05/30/20   Rod Can, CNM  ondansetron (ZOFRAN ODT) 4 MG disintegrating tablet Take 1 tablet (4 mg total) by mouth every 8 (eight) hours as needed for nausea or vomiting. 08/20/20   Johnn Hai, PA-C    Allergies Haldol [haloperidol lactate] and Ultram [tramadol]  Family History  Problem Relation Age of Onset  . Hypertension Mother   . Hyperlipidemia Mother   . Asthma Mother   . Parkinson's disease Father   . Lung cancer Maternal Grandmother   . Other Paternal Grandfather 31       Cardiac arrest  . Heart disease Paternal Grandfather   . Tourette syndrome Daughter     Social History Social History   Tobacco Use  . Smoking status: Current Every Day Smoker    Packs/day: 1.00    Years: 9.00    Pack years: 9.00    Types: E-cigarettes  . Smokeless tobacco: Never Used  Vaping Use  . Vaping Use: Never used  Substance Use Topics  . Alcohol use: Yes  Alcohol/week: 4.0 standard drinks    Types: 4 Cans of beer per week    Comment: Ocassional  . Drug use: Yes    Types: Marijuana    Comment: percocet -last use over 1 week ago. Heroin-last used 2/11    Review of Systems Constitutional: No fever/chills Eyes: No visual changes. ENT: No sore throat. Cardiovascular: Denies chest pain. Respiratory: Denies shortness of breath. Gastrointestinal: No abdominal pain.  Positive nausea, negative vomiting.  Positive diarrhea.  No constipation. Genitourinary: Negative for dysuria. Musculoskeletal: Positive for mild muscle aches. Skin: Negative for rash. Neurological: Negative for headaches, focal  weakness or numbness. ____________________________________________   PHYSICAL EXAM:  VITAL SIGNS: ED Triage Vitals  Enc Vitals Group     BP 08/20/20 0944 104/77     Pulse Rate 08/20/20 0944 78     Resp 08/20/20 0944 (!) 22     Temp 08/20/20 0944 98.1 F (36.7 C)     Temp Source 08/20/20 0944 Oral     SpO2 08/20/20 0944 100 %     Weight 08/20/20 0928 120 lb (54.4 kg)     Height 08/20/20 0928 5\' 2"  (1.575 m)     Head Circumference --      Peak Flow --      Pain Score 08/20/20 0927 3     Pain Loc --      Pain Edu? --      Excl. in [place]? --     Constitutional: Alert and oriented. Well appearing and in no acute distress. Eyes: Conjunctivae are normal.  Head: Atraumatic. Neck: No stridor.   Cardiovascular: Normal rate, regular rhythm. Grossly normal heart sounds.  Good peripheral circulation. Respiratory: Normal respiratory effort.  No retractions. Lungs CTAB. Gastrointestinal: Soft and nontender. No distention.  Bowel sounds are hyperactive x4 quadrants. Musculoskeletal: Moves upper and lower extremities any difficulty normal gait was noted. Neurologic:  Normal speech and language. No gross focal neurologic deficits are appreciated. No gait instability. Skin:  Skin is warm, dry and intact. No rash noted. Psychiatric: Mood and affect are normal. Speech and behavior are normal.  ____________________________________________   LABS (all labs ordered are listed, but only abnormal results are displayed)  Labs Reviewed  RESPIRATORY PANEL BY RT PCR (FLU A&B, COVID)    PROCEDURES  Procedure(s) performed (including Critical Care):  Procedures   ____________________________________________   INITIAL IMPRESSION / ASSESSMENT AND PLAN / ED COURSE  As part of my medical decision making, I reviewed the following data within the electronic MEDICAL RECORD NUMBER Notes from prior ED visits and [place] Controlled Substance Database  44 year old female presents to the ED with complaint of  nausea, vomiting and diarrhea for the last 2 days.  Patient denies any fever.  She states that she needs a negative Covid screening before she can return to work.  Patient was given Zofran while in the ED and no vomiting was noted during her ED stay.  She was able to drink fluids without any difficulty.  Patient is encouraged to drink fluids at home and also a prescription for Zofran was sent to her pharmacy.  She is to return to the emergency department if any severe worsening of her symptoms or urgent concerns.  ____________________________________________   FINAL CLINICAL IMPRESSION(S) / ED DIAGNOSES  Final diagnoses:  Viral gastroenteritis     ED Discharge Orders         Ordered    ondansetron (ZOFRAN ODT) 4 MG disintegrating tablet  Every 8 hours PRN  08/20/20 1152          *Please note:  Monya Kozakiewicz was evaluated in Emergency Department on 08/20/2020 for the symptoms described in the history of present illness. She was evaluated in the context of the global COVID-19 pandemic, which necessitated consideration that the patient might be at risk for infection with the SARS-CoV-2 virus that causes COVID-19. Institutional protocols and algorithms that pertain to the evaluation of patients at risk for COVID-19 are in a state of rapid change based on information released by regulatory bodies including the CDC and federal and state organizations. These policies and algorithms were followed during the patient's care in the ED.  Some ED evaluations and interventions may be delayed as a result of limited staffing during and the pandemic.*   Note:  This document was prepared using Dragon voice recognition software and may include unintentional dictation errors.    Johnn Hai, PA-C 08/20/20 1612    Carrie Mew, MD 08/21/20 [id number]

## 2020-09-09 ENCOUNTER — Other Ambulatory Visit: Payer: Self-pay

## 2020-09-09 ENCOUNTER — Emergency Department
Admission: EM | Admit: 2020-09-09 | Discharge: 2020-09-10 | Disposition: A | Discharge status: Home / Self Care | Payer: Medicaid Other | Attending: Emergency Medicine | Admitting: Emergency Medicine

## 2020-09-09 DIAGNOSIS — F1120 Opioid dependence, uncomplicated: Secondary | ICD-10-CM | POA: Diagnosis present

## 2020-09-09 DIAGNOSIS — R45851 Suicidal ideations: Secondary | ICD-10-CM | POA: Insufficient documentation

## 2020-09-09 DIAGNOSIS — F314 Bipolar disorder, current episode depressed, severe, without psychotic features: Secondary | ICD-10-CM | POA: Diagnosis present

## 2020-09-09 DIAGNOSIS — F3170 Bipolar disorder, currently in remission, most recent episode unspecified: Secondary | ICD-10-CM | POA: Insufficient documentation

## 2020-09-09 DIAGNOSIS — F17210 Nicotine dependence, cigarettes, uncomplicated: Secondary | ICD-10-CM | POA: Insufficient documentation

## 2020-09-09 DIAGNOSIS — Z20822 Contact with and (suspected) exposure to covid-19: Secondary | ICD-10-CM | POA: Insufficient documentation

## 2020-09-09 DIAGNOSIS — F1220 Cannabis dependence, uncomplicated: Secondary | ICD-10-CM | POA: Diagnosis present

## 2020-09-09 LAB — URINE DRUG SCREEN, QUALITATIVE (ARMC ONLY)
Amphetamines, Ur Screen: NOT DETECTED
Barbiturates, Ur Screen: NOT DETECTED
Benzodiazepine, Ur Scrn: NOT DETECTED
Cannabinoid 50 Ng, Ur ~~LOC~~: POSITIVE — AB
Cocaine Metabolite,Ur ~~LOC~~: POSITIVE — AB
MDMA (Ecstasy)Ur Screen: NOT DETECTED
Methadone Scn, Ur: NOT DETECTED
Opiate, Ur Screen: POSITIVE — AB
Phencyclidine (PCP) Ur S: NOT DETECTED
Tricyclic, Ur Screen: NOT DETECTED

## 2020-09-09 LAB — CBC
HCT: 42.5 % (ref 36.0–46.0)
Hemoglobin: 14.5 g/dL (ref 12.0–15.0)
MCH: 31 pg (ref 26.0–34.0)
MCHC: 34.1 g/dL (ref 30.0–36.0)
MCV: 90.8 fL (ref 80.0–100.0)
Platelets: 287 10*3/uL (ref 150–400)
RBC: 4.68 MIL/uL (ref 3.87–5.11)
RDW: 11.6 % (ref 11.5–15.5)
WBC: 9.6 10*3/uL (ref 4.0–10.5)
nRBC: 0 % (ref 0.0–0.2)

## 2020-09-09 LAB — COMPREHENSIVE METABOLIC PANEL
ALT: 11 U/L (ref 0–44)
AST: 10 U/L — ABNORMAL LOW (ref 15–41)
Albumin: 4.9 g/dL (ref 3.5–5.0)
Alkaline Phosphatase: 52 U/L (ref 38–126)
Anion gap: 11 (ref 5–15)
BUN: 11 mg/dL (ref 6–20)
CO2: 28 mmol/L (ref 22–32)
Calcium: 9.4 mg/dL (ref 8.9–10.3)
Chloride: 103 mmol/L (ref 98–111)
Creatinine, Ser: 0.66 mg/dL (ref 0.44–1.00)
GFR, Estimated: >60 mL/min (ref >60)
Glucose, Bld: 97 mg/dL (ref 70–99)
Potassium: 3.7 mmol/L (ref 3.5–5.1)
Sodium: 142 mmol/L (ref 135–145)
Total Bilirubin: 0.8 mg/dL (ref 0.3–1.2)
Total Protein: 7.9 g/dL (ref 6.5–8.1)

## 2020-09-09 LAB — ACETAMINOPHEN LEVEL: Acetaminophen (Tylenol), Serum: <10 ug/mL — ABNORMAL LOW (ref 10–30)

## 2020-09-09 LAB — SALICYLATE LEVEL: Salicylate Lvl: <7 mg/dL — ABNORMAL LOW (ref 7.0–30.0)

## 2020-09-09 LAB — POC URINE PREG, ED: Preg Test, Ur: NEGATIVE

## 2020-09-09 LAB — ETHANOL: Alcohol, Ethyl (B): <10 mg/dL (ref <10)

## 2020-09-09 MED ORDER — NICOTINE 21 MG/24HR TD PT24
21.0000 mg | MEDICATED_PATCH | Freq: Once | TRANSDERMAL | Status: DC
  Administered 2020-09-09: 21 mg via TRANSDERMAL
  Filled 2020-09-09: qty 1

## 2020-09-09 NOTE — ED Triage Notes (Addendum)
Pt arrived via POV with boyfriend, reports she has been feeling suicidal for 3 months, pt states she has been doing heroin and was scared she would do too much and kill herself on purpose.  Pt report she snorted the heroin did not use IV.  Pt also states she smoked crack and weed today, pt states she does not usually smoke crack.   Pt also asking for nicotine patch states she vapes.

## 2020-09-09 NOTE — ED Notes (Addendum)
Pt is being dressed out by this tech and Armed forces technical officer. Pt was provided with hospital attire scrubs (burgundy Top and Bottom). Belongings are being placed inside bag that was also provided. Belongings are named and labeled.   Belongings are :  Jewelry inside specimen cup with label named.  Purple jacket  Pink Underwear Blue Jeans Black pair of socks  Pink T-shirt 2 personal book bags (Big Purple Bag and A Teal Green Small Bag)  3 bags out of 3

## 2020-09-09 NOTE — ED Provider Notes (Signed)
[facility] Emergency Department Provider Note   ____________________________________________   First MD Initiated Contact with Patient 09/09/20 2156     (approximate)  I have reviewed the triage vital signs and the nursing notes.   HISTORY  Chief Complaint Suicidal    HPI Autumn [name] is a 44 y.o. female who reports she has been still feeling suicidal for 3 months.  She was doing heroin today and was scared she was going to overdose.  She also smoked crack and weed today does not usually do crack she says.  She has a psychiatrist but she does not think he believes her when she tells them that she is suicidal.         Past Medical History:  Diagnosis Date  . Allergy    Seasonal, Ultram (seizures)  . Anemia 2005  . Bipolar 1 disorder ([place])   . Glaucoma   . Seizures ([place]) 2008    Patient Active Problem List   Diagnosis Date Noted  . Bipolar 1 disorder, manic, moderate ([place]) 08/31/2019  . Alcohol abuse 08/31/2019  . Bipolar I disorder, most recent episode (or current) manic ([place]) 06/02/2019  . Affective psychosis, bipolar ([place]) 05/25/2019  . Bipolar affective disorder, current episode manic ([place]) 05/20/2019  . Opiate abuse, continuous ([place]) 11/21/2018  . Bipolar I disorder, most recent episode depressed, severe without psychotic features ([place]) 07/05/2017  . Tobacco use disorder 06/27/2017  . 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 Opioid use disorder, severe, dependence ([place]) 06/27/2017  . Cannabis use disorder, severe, dependence ([place]) 06/27/2017  . CIN II (cervical intraepithelial neoplasia II) 03/06/2014  . History of anorexia nervosa 03/06/2014  . Hx of glaucoma 10/02/2013    Past Surgical History:  Procedure Laterality Date  . BREAST BIOPSY Right    x 2  . CESAREAN SECTION     x 2  . LEEP  2000    Prior to Admission medications   Medication Sig Start Date End Date Taking? Authorizing Provider  ARIPiprazole  (ABILIFY) 20 MG tablet Take 1 tablet (20 mg total) by mouth daily. 09/10/19   Money, Lowry Ram, FNP  medroxyPROGESTERone (DEPO-PROVERA) 150 MG/ML injection Inject 1 mL (150 mg total) into the muscle every 3 (three) months. 05/30/20   Rod Can, CNM  ondansetron (ZOFRAN ODT) 4 MG disintegrating tablet Take 1 tablet (4 mg total) by mouth every 8 (eight) hours as needed for nausea or vomiting. 08/20/20   Johnn Hai, PA-C    Allergies Haldol [haloperidol lactate], Ultram [tramadol], and Ziprasidone hcl  Family History  Problem Relation Age of Onset  . Hypertension Mother   . Hyperlipidemia Mother   . Asthma Mother   . Parkinson's disease Father   . Lung cancer Maternal Grandmother   . Other Paternal Grandfather 67       Cardiac arrest  . Heart disease Paternal Grandfather   . Tourette syndrome Daughter     Social History Social History   Tobacco Use  . Smoking status: Current Every Day Smoker    Packs/day: 1.00    Years: 9.00    Pack years: 9.00    Types: E-cigarettes  . Smokeless tobacco: Never Used  Vaping Use  . Vaping Use: Never used  Substance Use Topics  . Alcohol use: Yes    Alcohol/week: 4.0 standard drinks    Types: 4 Cans of beer per week    Comment: Ocassional  . Drug use: Yes    Types: Marijuana  Comment: percocet -last use over 1 week ago. Heroin-last used 2/11    Review of Systems Constitutional: No fever/chills Eyes: No visual changes. ENT: No sore throat. Cardiovascular: Denies chest pain. Respiratory: Denies shortness of breath. Gastrointestinal: No abdominal pain.  No nausea, no vomiting.  No diarrhea.  No constipation. Genitourinary: Negative for dysuria. Musculoskeletal: Negative for back pain. Skin: Negative for rash. Neurological: Negative for headaches, focal weakness  ____________________________________________   PHYSICAL EXAM:  VITAL SIGNS: ED Triage Vitals  Enc Vitals Group     BP 09/09/20 2116 103/68     Pulse Rate  09/09/20 2116 71     Resp 09/09/20 2116 18     Temp 09/09/20 2116 97.8 F (36.6 C)     Temp Source 09/09/20 2116 Oral     SpO2 09/09/20 2116 98 %     Weight 09/09/20 2116 113 lb (51.3 kg)     Height 09/09/20 2116 5' 1.5" (1.562 m)     Head Circumference --      Peak Flow --      Pain Score 09/09/20 2129 0     Pain Loc --      Pain Edu? --      Excl. in [place]? --     Constitutional: Alert and oriented. Well appearing and in no acute distress. Eyes: Conjunctivae are normal. PER. EOMI. Head: Atraumatic. Nose: No congestion/rhinnorhea. Mouth/Throat: Mucous membranes are moist.  Oropharynx non-erythematous. Neck: No stridor.   Cardiovascular: Normal rate, regular rhythm. Grossly normal heart sounds.  Good peripheral circulation. Respiratory: Normal respiratory effort.  No retractions. Lungs CTAB. Gastrointestinal: Soft and nontender. No distention. No abdominal bruits. No CVA tenderness. Musculoskeletal: No lower extremity tenderness nor edema.  Neurologic:  Normal speech and language. No gross focal neurologic deficits are appreciated.. Skin:  Skin is warm, dry and intact. No rash noted.   ____________________________________________   LABS (all labs ordered are listed, but only abnormal results are displayed)  Labs Reviewed  CBC  COMPREHENSIVE METABOLIC PANEL  URINE DRUG SCREEN, QUALITATIVE ([place])  ACETAMINOPHEN LEVEL  ETHANOL  SALICYLATE LEVEL  POC URINE PREG, ED   ____________________________________________  EKG   ____________________________________________  RADIOLOGY Gertha Calkin, personally viewed and evaluated these images (plain radiographs) as part of my medical decision making, as well as reviewing the written report by the radiologist.  ED MD interpretation:    Official radiology report(s): No results found.  ____________________________________________   PROCEDURES  Procedure(s) performed (including Critical  Care):  Procedures   ____________________________________________   INITIAL IMPRESSION / ASSESSMENT AND PLAN / ED COURSE                ____________________________________________   FINAL CLINICAL IMPRESSION(S) / ED DIAGNOSES  Final diagnoses:  Suicidal ideation     ED Discharge Orders    None      *Please note:  Autumn [name] was evaluated in Emergency Department on 09/09/2020 for the symptoms described in the history of present illness. She was evaluated in the context of the global COVID-19 pandemic, which necessitated consideration that the patient might be at risk for infection with the SARS-CoV-2 virus that causes COVID-19. Institutional protocols and algorithms that pertain to the evaluation of patients at risk for COVID-19 are in a state of rapid change based on information released by regulatory bodies including the CDC and federal and state organizations. These policies and algorithms were followed during the patient's care in the ED.  Some ED evaluations and interventions may  be delayed as a result of limited staffing during and the pandemic.*   Note:  This document was prepared using Dragon voice recognition software and may include unintentional dictation errors.    Nena Polio, MD 09/09/20 2204

## 2020-09-09 NOTE — ED Notes (Signed)
Pt. Alert and oriented, warm and dry, in no distress. Pt. Denies HI, and AVH. Patient states having SI thoughts to OD on heroin and did some along with smoked some crack. Patient states she has been stressed due to work and her boyfriend and was making bad decisions. Patient states she  Pt. Encouraged to let nursing staff know of any concerns or needs.

## 2020-09-10 ENCOUNTER — Encounter: Payer: Self-pay | Admitting: Psychiatry

## 2020-09-10 ENCOUNTER — Inpatient Hospital Stay
Admission: RE | Admit: 2020-09-10 | Discharge: 2020-09-16 | DRG: 885 | Disposition: A | Discharge status: Home / Self Care | Payer: No Typology Code available for payment source | Source: Intra-hospital | Attending: Behavioral Health | Admitting: Behavioral Health

## 2020-09-10 DIAGNOSIS — F1120 Opioid dependence, uncomplicated: Secondary | ICD-10-CM | POA: Diagnosis present

## 2020-09-10 DIAGNOSIS — F319 Bipolar disorder, unspecified: Secondary | ICD-10-CM | POA: Diagnosis present

## 2020-09-10 DIAGNOSIS — Z8249 Family history of ischemic heart disease and other diseases of the circulatory system: Secondary | ICD-10-CM | POA: Diagnosis not present

## 2020-09-10 DIAGNOSIS — Z885 Allergy status to narcotic agent status: Secondary | ICD-10-CM

## 2020-09-10 DIAGNOSIS — F314 Bipolar disorder, current episode depressed, severe, without psychotic features: Secondary | ICD-10-CM | POA: Diagnosis present

## 2020-09-10 DIAGNOSIS — F17290 Nicotine dependence, other tobacco product, uncomplicated: Secondary | ICD-10-CM | POA: Diagnosis present

## 2020-09-10 DIAGNOSIS — Z9114 Patient's other noncompliance with medication regimen: Secondary | ICD-10-CM | POA: Diagnosis not present

## 2020-09-10 DIAGNOSIS — R45851 Suicidal ideations: Secondary | ICD-10-CM | POA: Diagnosis present

## 2020-09-10 DIAGNOSIS — F419 Anxiety disorder, unspecified: Secondary | ICD-10-CM | POA: Diagnosis present

## 2020-09-10 DIAGNOSIS — F1520 Other stimulant dependence, uncomplicated: Secondary | ICD-10-CM | POA: Diagnosis present

## 2020-09-10 DIAGNOSIS — G4700 Insomnia, unspecified: Secondary | ICD-10-CM | POA: Diagnosis present

## 2020-09-10 DIAGNOSIS — Z820 Family history of epilepsy and other diseases of the nervous system: Secondary | ICD-10-CM | POA: Diagnosis not present

## 2020-09-10 DIAGNOSIS — Z825 Family history of asthma and other chronic lower respiratory diseases: Secondary | ICD-10-CM

## 2020-09-10 DIAGNOSIS — Z888 Allergy status to other drugs, medicaments and biological substances status: Secondary | ICD-10-CM

## 2020-09-10 DIAGNOSIS — Z801 Family history of malignant neoplasm of trachea, bronchus and lung: Secondary | ICD-10-CM

## 2020-09-10 DIAGNOSIS — Z83438 Family history of other disorder of lipoprotein metabolism and other lipidemia: Secondary | ICD-10-CM

## 2020-09-10 DIAGNOSIS — F17200 Nicotine dependence, unspecified, uncomplicated: Secondary | ICD-10-CM | POA: Diagnosis present

## 2020-09-10 DIAGNOSIS — F1220 Cannabis dependence, uncomplicated: Secondary | ICD-10-CM | POA: Diagnosis present

## 2020-09-10 DIAGNOSIS — F1510 Other stimulant abuse, uncomplicated: Secondary | ICD-10-CM | POA: Diagnosis present

## 2020-09-10 LAB — RESPIRATORY PANEL BY RT PCR (FLU A&B, COVID)
Influenza A by PCR: NEGATIVE
Influenza B by PCR: NEGATIVE
SARS Coronavirus 2 by RT PCR: NEGATIVE

## 2020-09-10 MED ORDER — ACETAMINOPHEN 325 MG PO TABS
650.0000 mg | ORAL_TABLET | Freq: Four times a day (QID) | ORAL | Status: DC | PRN
  Administered 2020-09-13 – 2020-09-15 (×2): 650 mg via ORAL
  Filled 2020-09-10 (×2): qty 2

## 2020-09-10 MED ORDER — NICOTINE 21 MG/24HR TD PT24
21.0000 mg | MEDICATED_PATCH | Freq: Once | TRANSDERMAL | Status: AC
  Administered 2020-09-11: 21 mg via TRANSDERMAL
  Filled 2020-09-10: qty 1

## 2020-09-10 MED ORDER — ALUM & MAG HYDROXIDE-SIMETH 200-200-20 MG/5ML PO SUSP: 30.0000 mL | ORAL | Status: DC | PRN

## 2020-09-10 MED ORDER — MAGNESIUM HYDROXIDE 400 MG/5ML PO SUSP: 30.0000 mL | Freq: Every day | ORAL | Status: DC | PRN

## 2020-09-10 MED ORDER — ARIPIPRAZOLE 10 MG PO TABS
20.0000 mg | ORAL_TABLET | Freq: Every day | ORAL | Status: DC
  Administered 2020-09-11 – 2020-09-16 (×6): 20 mg via ORAL
  Filled 2020-09-10 (×6): qty 2

## 2020-09-10 MED ORDER — BUPRENORPHINE HCL-NALOXONE HCL 8-2 MG SL SUBL
1.0000 | SUBLINGUAL_TABLET | Freq: Every day | SUBLINGUAL | Status: AC
  Administered 2020-09-11 – 2020-09-12 (×2): 1 via SUBLINGUAL
  Filled 2020-09-10 (×2): qty 1

## 2020-09-10 MED ORDER — BUPRENORPHINE HCL-NALOXONE HCL 8-2 MG SL SUBL: 1.0000 | SUBLINGUAL_TABLET | Freq: Every day | SUBLINGUAL | Status: DC

## 2020-09-10 MED ORDER — ARIPIPRAZOLE 10 MG PO TABS: 20.0000 mg | ORAL_TABLET | Freq: Every day | ORAL | Status: DC

## 2020-09-10 MED ORDER — DIVALPROEX SODIUM 500 MG PO DR TAB
500.0000 mg | DELAYED_RELEASE_TABLET | Freq: Two times a day (BID) | ORAL | Status: DC
  Administered 2020-09-10 – 2020-09-16 (×12): 500 mg via ORAL
  Filled 2020-09-10 (×12): qty 1

## 2020-09-10 MED ORDER — TRAZODONE HCL 100 MG PO TABS
200.0000 mg | ORAL_TABLET | Freq: Every evening | ORAL | Status: DC | PRN
  Administered 2020-09-10 – 2020-09-15 (×5): 200 mg via ORAL
  Filled 2020-09-10 (×5): qty 2

## 2020-09-10 MED ORDER — DIVALPROEX SODIUM 500 MG PO DR TAB: 500.0000 mg | DELAYED_RELEASE_TABLET | Freq: Two times a day (BID) | ORAL | Status: DC

## 2020-09-10 MED ORDER — LITHIUM CARBONATE ER 450 MG PO TBCR
450.0000 mg | EXTENDED_RELEASE_TABLET | Freq: Two times a day (BID) | ORAL | Status: DC
  Administered 2020-09-10 – 2020-09-16 (×12): 450 mg via ORAL
  Filled 2020-09-10 (×12): qty 1

## 2020-09-10 MED ORDER — LITHIUM CARBONATE ER 450 MG PO TBCR: 450.0000 mg | EXTENDED_RELEASE_TABLET | Freq: Two times a day (BID) | ORAL | Status: DC

## 2020-09-10 NOTE — ED Notes (Signed)
Report given to receiving nurse. Security called for escort to lower level [employer]

## 2020-09-10 NOTE — Consult Note (Signed)
[facility] Consult   Reason for Consult: Consult for this 44 year old woman with a history of bipolar disorder and substance abuse who came into the hospital reporting extreme depression and suicidal ideation Referring Physician: Corky Downs Patient Identification: Autumn [name] MRN:  [id number] Principal Diagnosis: Bipolar I disorder, most recent episode depressed, severe without psychotic features ([place]) Diagnosis:  Principal Problem:   Bipolar I disorder, most recent episode depressed, severe without psychotic features ([place]) Active Problems:   Opioid use disorder, severe, dependence ([place])   Cannabis use disorder, severe, dependence ([place])   Total Time spent with patient: 1 hour  Subjective:   Autumn [name] is a 44 y.o. female patient admitted with "I have been feeling suicidal".  HPI: Patient seen chart reviewed.  Patient known from previous encounters.  44 year old woman with a history of bipolar disorder and substance abuse.  Came to the hospital saying that she has been extremely depressed.  Mood is been worse for about a month at least if not more.  Sleep is poor.  Energy poor.  She says that she has been using heroin daily snorting it also admits to recently using cocaine as well as ongoing cannabis abuse.  Not drinking regularly.  Patient has not been compliant with prescribed medications for her bipolar disorder.  She denies any hallucinations or psychosis.  Denies any homicidal ideation.  No new physical complaints.  Past Psychiatric History: Patient has a history of bipolar disorder and has presented with mixed psychosis and depression in the past.  Has done well on mood stabilizers but is currently noncompliant.  History of recurrent substance abuse problems especially opiates in the past.  When compliant with medication and treatment has had some stability before.  Risk to Self: Suicidal Ideation: No Suicidal Intent: No Is patient at risk for suicide?: Yes Suicidal  Plan?: No Access to Means: No What has been your use of drugs/alcohol within the last 12 months?: Heroin, Cocaine, and Marijuana How many times?: 1 Other Self Harm Risks: High Risk Triggers for Past Attempts: Other (Comment) (Pt reported stress as her trigger) Intentional Self Injurious Behavior: None Risk to Others: Homicidal Ideation: No Thoughts of Harm to Others: No Current Homicidal Intent: No Current Homicidal Plan: No Access to Homicidal Means: No Identified Victim: n/a History of harm to others?: No Assessment of Violence: None Noted Violent Behavior Description: n/a Does patient have access to weapons?: No Criminal Charges Pending?: No Does patient have a court date: No Prior Inpatient Therapy: Prior Inpatient Therapy: Yes Prior Therapy Dates: 2018, 2019, 2020 Prior Therapy Facilty/Provider(s): [facility] Reason for Treatment: Bipolar Affectiveness; Schizophrenia; Mania Prior Outpatient Therapy: Prior Outpatient Therapy: No Does patient have an ACCT team?: No Does patient have Intensive In-House Services?  : No Does patient have [facility] services? : No Does patient have P4CC services?: No  Past Medical History:  Past Medical History:  Diagnosis Date  . Allergy    Seasonal, Ultram (seizures)  . Anemia 2005  . Bipolar 1 disorder ([place])   . Glaucoma   . Seizures ([place]) 2008    Past Surgical History:  Procedure Laterality Date  . BREAST BIOPSY Right    x 2  . CESAREAN SECTION     x 2  . LEEP  2000   Family History:  Family History  Problem Relation Age of Onset  . Hypertension Mother   . Hyperlipidemia Mother   . Asthma Mother   . Parkinson's disease Father   . Lung cancer Maternal Grandmother   .  Other Paternal Grandfather 3       Cardiac arrest  . Heart disease Paternal Grandfather   . Tourette syndrome Daughter    Family Psychiatric  History: None reported Social History:  Social History   Substance and Sexual Activity  Alcohol Use Yes  .  Alcohol/week: 4.0 standard drinks  . Types: 4 Cans of beer per week   Comment: Ocassional     Social History   Substance and Sexual Activity  Drug Use Yes  . Types: Marijuana   Comment: percocet -last use over 1 week ago. Heroin-last used 2/11    Social History   Socioeconomic History  . Marital status: Single    Spouse name: Not on file  . Number of children: Not on file  . Years of education: Not on file  . Highest education level: Not on file  Occupational History  . Not on file  Tobacco Use  . Smoking status: Current Every Day Smoker    Packs/day: 1.00    Years: 9.00    Pack years: 9.00    Types: E-cigarettes  . Smokeless tobacco: Never Used  Vaping Use  . Vaping Use: Never used  Substance and Sexual Activity  . Alcohol use: Yes    Alcohol/week: 4.0 standard drinks    Types: 4 Cans of beer per week    Comment: Ocassional  . Drug use: Yes    Types: Marijuana    Comment: percocet -last use over 1 week ago. Heroin-last used 2/11  . Sexual activity: Yes    Birth control/protection: None  Other Topics Concern  . Not on file  Social History Narrative  . Not on file   Social Determinants of Health   Financial Resource Strain:   . Difficulty of Paying Living Expenses: Not on file  Food Insecurity:   . Worried About Charity fundraiser in the Last Year: Not on file  . Ran Out of Food in the Last Year: Not on file  Transportation Needs:   . Lack of Transportation (Medical): Not on file  . Lack of Transportation (Non-Medical): Not on file  Physical Activity:   . Days of Exercise per Week: Not on file  . Minutes of Exercise per Session: Not on file  Stress:   . Feeling of Stress : Not on file  Social Connections:   . Frequency of Communication with Friends and Family: Not on file  . Frequency of Social Gatherings with Friends and Family: Not on file  . Attends Religious Services: Not on file  . Active Member of Clubs or Organizations: Not on file  . Attends  Archivist Meetings: Not on file  . Marital Status: Not on file   Additional Social History:    Allergies:   Allergies  Allergen Reactions  . Haldol [Haloperidol Lactate]     Patient states that she gets stiff muscles when taking Haldol  . Ultram [Tramadol] Other (See Comments)    Seizures  . Ziprasidone Hcl     Other reaction(s): Other (See Comments) PER PT WHEN SHE TAKES THIS SHE CANT MOVE HER NECK    Labs:  Results for orders placed or performed during the hospital encounter of 09/09/20 (from the past 48 hour(s))  Comprehensive metabolic panel     Status: Abnormal   Collection Time: 09/09/20  9:20 PM  Result Value Ref Range   Sodium 142 135 - 145 mmol/L   Potassium 3.7 3.5 - 5.1 mmol/L   Chloride 103 98 -  111 mmol/L   CO2 28 22 - 32 mmol/L   Glucose, Bld 97 70 - 99 mg/dL    Comment: Glucose reference range applies only to samples taken after fasting for at least 8 hours.   BUN 11 6 - 20 mg/dL   Creatinine, Ser 0.66 0.44 - 1.00 mg/dL   Calcium 9.4 8.9 - 10.3 mg/dL   Total Protein 7.9 6.5 - 8.1 g/dL   Albumin 4.9 3.5 - 5.0 g/dL   AST 10 (L) 15 - 41 U/L   ALT 11 0 - 44 U/L   Alkaline Phosphatase 52 38 - 126 U/L   Total Bilirubin 0.8 0.3 - 1.2 mg/dL   GFR, Estimated >60 >60 mL/min    Comment: (NOTE) Calculated using the CKD-EPI Creatinine Equation (2021)    Anion gap 11 5 - 15    Comment: Performed at [facility], [address] [place], [place] [address]  cbc     Status: None   Collection Time: 09/09/20  9:20 PM  Result Value Ref Range   WBC 9.6 4.0 - 10.5 K/uL   RBC 4.68 3.87 - 5.11 MIL/uL   Hemoglobin 14.5 12.0 - 15.0 g/dL   HCT 42.5 36 - 46 %   MCV 90.8 80.0 - 100.0 fL   MCH 31.0 26.0 - 34.0 pg   MCHC 34.1 30.0 - 36.0 g/dL   RDW 11.6 11.5 - 15.5 %   Platelets 287 150 - 400 K/uL   nRBC 0.0 0.0 - 0.2 %    Comment: Performed at [facility], [address] [place], [place] [address]  Urine Drug Screen, Qualitative     Status:  Abnormal   Collection Time: 09/09/20  9:20 PM  Result Value Ref Range   Tricyclic, Ur Screen NONE DETECTED NONE DETECTED   Amphetamines, Ur Screen NONE DETECTED NONE DETECTED   MDMA (Ecstasy)Ur Screen NONE DETECTED NONE DETECTED   Cocaine Metabolite,Ur [place] POSITIVE (A) NONE DETECTED   Opiate, Ur Screen POSITIVE (A) NONE DETECTED   Phencyclidine (PCP) Ur S NONE DETECTED NONE DETECTED   Cannabinoid 50 Ng, Ur [place] POSITIVE (A) NONE DETECTED   Barbiturates, Ur Screen NONE DETECTED NONE DETECTED   Benzodiazepine, Ur Scrn NONE DETECTED NONE DETECTED   Methadone Scn, Ur NONE DETECTED NONE DETECTED    Comment: (NOTE) Tricyclics + metabolites, urine    Cutoff 1000 ng/mL Amphetamines + metabolites, urine  Cutoff 1000 ng/mL MDMA (Ecstasy), urine              Cutoff 500 ng/mL Cocaine Metabolite, urine          Cutoff 300 ng/mL Opiate + metabolites, urine        Cutoff 300 ng/mL Phencyclidine (PCP), urine         Cutoff 25 ng/mL Cannabinoid, urine                 Cutoff 50 ng/mL Barbiturates + metabolites, urine  Cutoff 200 ng/mL Benzodiazepine, urine              Cutoff 200 ng/mL Methadone, urine                   Cutoff 300 ng/mL  The urine drug screen provides only a preliminary, unconfirmed analytical test result and should not be used for non-medical purposes. Clinical consideration and professional judgment should be applied to any positive drug screen result due to possible interfering substances. A more specific alternate chemical method must be used in order  to obtain a confirmed analytical result. Gas chromatography / mass spectrometry (GC/MS) is the preferred confirm atory method. Performed at [facility], [address] [place], [place] [address]   POC urine preg, ED     Status: None   Collection Time: 09/09/20  9:43 PM  Result Value Ref Range   Preg Test, Ur NEGATIVE NEGATIVE    Comment:        THE SENSITIVITY OF THIS METHODOLOGY IS >24 mIU/mL   Acetaminophen  level     Status: Abnormal   Collection Time: 09/09/20 10:23 PM  Result Value Ref Range   Acetaminophen (Tylenol), Serum <10 (L) 10 - 30 ug/mL    Comment: (NOTE) Therapeutic concentrations vary significantly. A range of 10-30 ug/mL  may be an effective concentration for many patients. However, some  are best treated at concentrations outside of this range. Acetaminophen concentrations >150 ug/mL at 4 hours after ingestion  and >50 ug/mL at 12 hours after ingestion are often associated with  toxic reactions.  Performed at [facility], [address] [place], [place] [address]   Ethanol     Status: None   Collection Time: 09/09/20 10:23 PM  Result Value Ref Range   Alcohol, Ethyl (B) <10 <10 mg/dL    Comment: (NOTE) Lowest detectable limit for serum alcohol is 10 mg/dL.  For medical purposes only. Performed at [facility], [address] [place], [place] [address]   Salicylate level     Status: Abnormal   Collection Time: 09/09/20 10:23 PM  Result Value Ref Range   Salicylate Lvl <2.3 (L) 7.0 - 30.0 mg/dL    Comment: Performed at [facility], [address] [place], [place] [address]  Respiratory Panel by RT PCR (Flu A&B, Covid) - Nasopharyngeal Swab     Status: None   Collection Time: 09/10/20  9:47 AM   Specimen: Nasopharyngeal Swab  Result Value Ref Range   SARS Coronavirus 2 by RT PCR NEGATIVE NEGATIVE    Comment: (NOTE) SARS-CoV-2 target nucleic acids are NOT DETECTED.  The SARS-CoV-2 RNA is generally detectable in upper respiratoy specimens during the acute phase of infection. The lowest concentration of SARS-CoV-2 viral copies this assay can detect is 131 copies/mL. A negative result does not preclude SARS-Cov-2 infection and should not be used as the sole basis for treatment or other patient management decisions. A negative result may occur with  improper specimen collection/handling, submission of specimen other than  nasopharyngeal swab, presence of viral mutation(s) within the areas targeted by this assay, and inadequate number of viral copies (<131 copies/mL). A negative result must be combined with clinical observations, patient history, and epidemiological information. The expected result is Negative.  Fact Sheet for Patients:  PinkCheek.be  Fact Sheet for Healthcare Providers:  GravelBags.it  This test is no t yet approved or cleared by the [place] FDA and  has been authorized for detection and/or diagnosis of SARS-CoV-2 by FDA under an Emergency Use Authorization (EUA). This EUA will remain  in effect (meaning this test can be used) for the duration of the COVID-19 declaration under Section 564(b)(1) of the Act, 21 U.S.C. section 360bbb-3(b)(1), unless the authorization is terminated or revoked sooner.     Influenza A by PCR NEGATIVE NEGATIVE   Influenza B by PCR NEGATIVE NEGATIVE    Comment: (NOTE) The Xpert Xpress SARS-CoV-2/FLU/RSV assay is intended as an aid in  the diagnosis of influenza from Nasopharyngeal swab specimens and  should not be  used as a sole basis for treatment. Nasal washings and  aspirates are unacceptable for Xpert Xpress SARS-CoV-2/FLU/RSV  testing.  Fact Sheet for Patients: PinkCheek.be  Fact Sheet for Healthcare Providers: GravelBags.it  This test is not yet approved or cleared by the [place] FDA and  has been authorized for detection and/or diagnosis of SARS-CoV-2 by  FDA under an Emergency Use Authorization (EUA). This EUA will remain  in effect (meaning this test can be used) for the duration of the  Covid-19 declaration under Section 564(b)(1) of the Act, 21  U.S.C. section 360bbb-3(b)(1), unless the authorization is  terminated or revoked. Performed at [facility], [address], [place], [place] [address]      Current Facility-Administered Medications  Medication Dose Route Frequency Provider Last Rate Last Admin  . ARIPiprazole (ABILIFY) tablet 20 mg  20 mg Oral Daily Romelo Sciandra T, MD      . buprenorphine-naloxone (SUBOXONE) 8-2 mg per SL tablet 1 tablet  1 tablet Sublingual Daily Kortni Hasten T, MD      . divalproex (DEPAKOTE) DR tablet 500 mg  500 mg Oral Q12H Lyle Leisner T, MD      . lithium carbonate (ESKALITH) CR tablet 450 mg  450 mg Oral Q12H Ameenah Prosser T, MD      . nicotine (NICODERM CQ - dosed in mg/24 hours) patch 21 mg  21 mg Transdermal Once Nena Polio, MD   21 mg at 09/09/20 2303   Current Outpatient Medications  Medication Sig Dispense Refill  . ARIPiprazole (ABILIFY) 20 MG tablet Take 1 tablet (20 mg total) by mouth daily. 30 tablet 1  . medroxyPROGESTERone (DEPO-PROVERA) 150 MG/ML injection Inject 1 mL (150 mg total) into the muscle every 3 (three) months. 1 mL 3  . ondansetron (ZOFRAN ODT) 4 MG disintegrating tablet Take 1 tablet (4 mg total) by mouth every 8 (eight) hours as needed for nausea or vomiting. 15 tablet 0    Musculoskeletal: Strength & Muscle Tone: within normal limits Gait & Station: normal Patient leans: N/A  Psychiatric Specialty Exam: Physical Exam Vitals and nursing note reviewed.  Constitutional:      Appearance: She is well-developed.  HENT:     Head: Normocephalic and atraumatic.  Eyes:     Conjunctiva/sclera: Conjunctivae normal.     Pupils: Pupils are equal, round, and reactive to light.  Cardiovascular:     Heart sounds: Normal heart sounds.  Pulmonary:     Effort: Pulmonary effort is normal.  Abdominal:     Palpations: Abdomen is soft.  Musculoskeletal:        General: Normal range of motion.     Cervical back: Normal range of motion.  Skin:    General: Skin is warm and dry.  Neurological:     General: No focal deficit present.     Mental Status: She is alert.  Psychiatric:        Attention and Perception: She is  inattentive.        Mood and Affect: Mood is depressed. Affect is blunt.        Speech: Speech is delayed.        Behavior: Behavior is slowed.        Thought Content: Thought content includes suicidal ideation. Thought content does not include suicidal plan.        Cognition and Memory: Cognition is impaired.        Judgment: Judgment is impulsive.     Review of Systems  Constitutional: Negative.   HENT: Negative.   Eyes: Negative.   Respiratory: Negative.   Cardiovascular: Negative.   Gastrointestinal: Negative.   Musculoskeletal: Positive for arthralgias and myalgias.  Skin: Negative.   Neurological: Negative.   Psychiatric/Behavioral: Positive for dysphoric mood and suicidal ideas.    Blood pressure (!) 87/59, pulse 82, temperature (!) 97.5 F (36.4 C), temperature source Oral, resp. rate 16, height 5' 1.5" (1.562 m), weight 51.3 kg, last menstrual period 09/01/2020, SpO2 99 %.Body mass index is 21.01 kg/m.  General Appearance: Casual  Eye Contact:  Minimal  Speech:  Slow  Volume:  Decreased  Mood:  Depressed  Affect:  Congruent  Thought Process:  Coherent  Orientation:  Full (Time, Place, and Person)  Thought Content:  Rumination and Tangential  Suicidal Thoughts:  Yes.  with intent/plan  Homicidal Thoughts:  No  Memory:  Immediate;   Fair Recent;   Poor Remote;   Fair  Judgement:  Impaired  Insight:  Fair  Psychomotor Activity:  Decreased  Concentration:  Concentration: Poor  Recall:  Poor  Fund of Knowledge:  Fair  Language:  Fair  Akathisia:  No  Handed:  Right  AIMS (if indicated):     Assets:  Desire for Improvement Resilience  ADL's:  Impaired  Cognition:  Impaired,  Mild  Sleep:        Treatment Plan Summary: Daily contact with patient to assess and evaluate symptoms and progress in treatment, Medication management and Plan Patient with bipolar disorder currently presenting extremely depressed with active suicidal ideation but cooperative with  treatment and insightful.  Not reporting acute psychotic symptoms.  Patient has been abusing narcotics and says that she has been snorting what she believes to be heroin.  Drug screen is positive for opiates.  Currently she is complaining of opiate withdrawal symptoms mostly moderate myalgias.  Also used cocaine at least once.  Off of medicine.  Poor self-care.  Patient meets criteria for inpatient treatment.  She is agreeable to the plan.  Orders completed to readmit.  Orders to restart lithium Depakote and Abilify which had been a combination previously that had been well-tolerated and worked for her.  3 days of Suboxone treatment for withdrawal from opiates.  Orders completed.  Case reviewed with ER physician.  Case reviewed with TTS.  Disposition: Recommend psychiatric Inpatient admission when medically cleared.  Alethia Berthold, MD 09/10/2020 2:10 PM

## 2020-09-10 NOTE — BH Assessment (Signed)
Patient is to be admitted to [facility] by Dr. Weber Cooks.  Attending Physician will be Dr. Domingo Cocking.   Patient has been assigned to room 306, by [place].   ER staff is aware of the admission:  [place], ER Secretary    Dr. Corky Downs, ER MD   Jeanett Schlein, Patient's Nurse   Elberta Fortis, Patient Access.

## 2020-09-10 NOTE — ED Notes (Signed)
Assumed care of patient patient patient calm and cooperative. Continues to have SI thoughts, contracted for safety. Lunch order obtained. Patient laying calmly in room will continue to assess. Awaiting psych consult and further plan of care.

## 2020-09-10 NOTE — BH Assessment (Signed)
Writer spoke with the patient to complete an updated/reassessment. She reports of having SI but it has decreased, while in the ER. However, she has concerns of it  worsened while in another environment. She states she need her medications adjusted because she's not doing well.  Patient denies HI and AV/H.
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
## 2020-09-10 NOTE — ED Notes (Signed)
Breakfast tray given. °

## 2020-09-10 NOTE — ED Notes (Signed)
Lunch tray given. 

## 2020-09-11 DIAGNOSIS — F314 Bipolar disorder, current episode depressed, severe, without psychotic features: Secondary | ICD-10-CM | POA: Diagnosis not present

## 2020-09-11 MED ORDER — ADULT MULTIVITAMIN W/MINERALS CH
1.0000 | ORAL_TABLET | Freq: Every day | ORAL | Status: DC
  Administered 2020-09-12 – 2020-09-16 (×5): 1 via ORAL
  Filled 2020-09-11 (×5): qty 1

## 2020-09-11 MED ORDER — ENSURE ENLIVE PO LIQD
237.0000 mL | Freq: Two times a day (BID) | ORAL | Status: DC
  Administered 2020-09-11 – 2020-09-16 (×9): 237 mL via ORAL

## 2020-09-11 NOTE — H&P (Addendum)
Psychiatric Admission Assessment Adult  Patient Identification: Autumn [name] MRN:  [id number] Date of Evaluation:  09/11/2020 Chief Complaint:  Bipolar depression ([place]) [F31.9] Principal Diagnosis: Bipolar I disorder, most recent episode depressed, severe without psychotic features ([place]) Diagnosis:  Principal Problem:   Bipolar I disorder, most recent episode depressed, severe without psychotic features ([place]) Active Problems:   Tobacco use disorder   Amphetamine use disorder, severe (HCC)   Opioid use disorder, severe, dependence ([place])   Cannabis use disorder, severe, dependence ([place])  History of Present Illness: Patient assessed one-on-one at bedside today. She states she came to the hospital for severe depression and suicidal ideation in the context of medication noncompliance and relapse on cocaine, cannabis, and heroin. Today she reports she continues to feel very depressed, but does not have active suicidal plans at this time. She has been restarted on Abilify, Depakote, and Lithium which she states work well for her. She is able to afford medications, and denies having side effects to them previously. She is also experiencing withdrawal symptoms of chills, fatigue, yawning, and body cramps. She denies diarrhea, rhinorrhea, nausea, or vomiting. She is currently on Suboxone for acute heroin detox. Her goal for the hospital stay is getting back on her medications to assist with depression, and staying sober. She is hoping to return to [facility] for psychiatric care at discharge.   Associated Signs/Symptoms: Depression Symptoms:  depressed mood, anhedonia, insomnia, feelings of worthlessness/guilt, hopelessness, suicidal thoughts with specific plan, anxiety, Duration of Depression Symptoms: No data recorded (Hypo) Manic Symptoms:  Impulsivity, Irritable Mood, Anxiety Symptoms:  Excessive Worry, Psychotic Symptoms:  None Duration of Psychotic Symptoms: No data recorded PTSD  Symptoms: NA Total Time spent with patient: 1 hour  Past Psychiatric History: Patient has a history of bipolar disorder and has presented with mixed psychosis and depression in the past.  Has done well on mood stabilizers but is currently noncompliant.  History of recurrent substance abuse problems especially opiates in the past.  When compliant with medication and treatment has had some stability before.  Is the patient at risk to self? Yes.    Has the patient been a risk to self in the past 6 months? Yes.    Has the patient been a risk to self within the distant past? Yes.    Is the patient a risk to others? No.  Has the patient been a risk to others in the past 6 months? No.  Has the patient been a risk to others within the distant past? No.   Prior Inpatient Therapy:   Prior Outpatient Therapy:    Alcohol Screening: Patient refused Alcohol Screening Tool: Yes 1. How often do you have a drink containing alcohol?: Never 2. How many drinks containing alcohol do you have on a typical day when you are drinking?: 1 or 2 3. How often do you have six or more drinks on one occasion?: Never AUDIT-C Score: 0 4. How often during the last year have you found that you were not able to stop drinking once you had started?: Never 5. How often during the last year have you failed to do what was normally expected from you because of drinking?: Never 6. How often during the last year have you needed a first drink in the morning to get yourself going after a heavy drinking session?: Never 7. How often during the last year have you had a feeling of guilt of remorse after drinking?: Never 8. How often during the last year have  you been unable to remember what happened the night before because you had been drinking?: Never 9. Have you or someone else been injured as a result of your drinking?: No 10. Has a relative or friend or a doctor or another health worker been concerned about your drinking or suggested  you cut down?: No Alcohol Use Disorder Identification Test Final Score (AUDIT): 0 Alcohol Brief Interventions/Follow-up: AUDIT Score <7 follow-up not indicated Substance Abuse History in the last 12 months:  Yes.   Consequences of Substance Abuse: Withdrawal Symptoms:   Cramps Diaphoresis Diarrhea Previous Psychotropic Medications: Yes  Psychological Evaluations: Yes  Past Medical History:  Past Medical History:  Diagnosis Date  . Allergy    Seasonal, Ultram (seizures)  . Anemia 2005  . Bipolar 1 disorder ([place])   . Glaucoma   . Seizures ([place]) 2008    Past Surgical History:  Procedure Laterality Date  . BREAST BIOPSY Right    x 2  . CESAREAN SECTION     x 2  . LEEP  2000   Family History:  Family History  Problem Relation Age of Onset  . Hypertension Mother   . Hyperlipidemia Mother   . Asthma Mother   . Parkinson's disease Father   . Lung cancer Maternal Grandmother   . Other Paternal Grandfather 26       Cardiac arrest  . Heart disease Paternal Grandfather   . Tourette syndrome Daughter    Family Psychiatric  History: None reported Tobacco Screening: Have you used any form of tobacco in the last 30 days? (Cigarettes, Smokeless Tobacco, Cigars, and/or Pipes): Yes Tobacco use, Select all that apply: 4 or less cigarettes per day Are you interested in Tobacco Cessation Medications?: Yes, will notify MD for an order Counseled patient on smoking cessation including recognizing danger situations, developing coping skills and basic information about quitting provided: Yes Social History:  Social History   Substance and Sexual Activity  Alcohol Use Yes  . Alcohol/week: 4.0 standard drinks  . Types: 4 Cans of beer per week   Comment: Ocassional     Social History   Substance and Sexual Activity  Drug Use Yes  . Types: Marijuana   Comment: percocet -last use over 1 week ago. Heroin-last used 2/11    Additional Social History:                            Allergies:   Allergies  Allergen Reactions  . Haldol [Haloperidol Lactate]     Patient states that she gets stiff muscles when taking Haldol  . Ultram [Tramadol] Other (See Comments)    Seizures  . Ziprasidone Hcl     Other reaction(s): Other (See Comments) PER PT WHEN SHE TAKES THIS SHE CANT MOVE HER NECK   Lab Results:  Results for orders placed or performed during the hospital encounter of 09/09/20 (from the past 48 hour(s))  Comprehensive metabolic panel     Status: Abnormal   Collection Time: 09/09/20  9:20 PM  Result Value Ref Range   Sodium 142 135 - 145 mmol/L   Potassium 3.7 3.5 - 5.1 mmol/L   Chloride 103 98 - 111 mmol/L   CO2 28 22 - 32 mmol/L   Glucose, Bld 97 70 - 99 mg/dL    Comment: Glucose reference range applies only to samples taken after fasting for at least 8 hours.   BUN 11 6 - 20 mg/dL   Creatinine, Ser  0.66 0.44 - 1.00 mg/dL   Calcium 9.4 8.9 - 10.3 mg/dL   Total Protein 7.9 6.5 - 8.1 g/dL   Albumin 4.9 3.5 - 5.0 g/dL   AST 10 (L) 15 - 41 U/L   ALT 11 0 - 44 U/L   Alkaline Phosphatase 52 38 - 126 U/L   Total Bilirubin 0.8 0.3 - 1.2 mg/dL   GFR, Estimated >60 >60 mL/min    Comment: (NOTE) Calculated using the CKD-EPI Creatinine Equation (2021)    Anion gap 11 5 - 15    Comment: Performed at [facility], [address] [place], [place] [address]  cbc     Status: None   Collection Time: 09/09/20  9:20 PM  Result Value Ref Range   WBC 9.6 4.0 - 10.5 K/uL   RBC 4.68 3.87 - 5.11 MIL/uL   Hemoglobin 14.5 12.0 - 15.0 g/dL   HCT 42.5 36 - 46 %   MCV 90.8 80.0 - 100.0 fL   MCH 31.0 26.0 - 34.0 pg   MCHC 34.1 30.0 - 36.0 g/dL   RDW 11.6 11.5 - 15.5 %   Platelets 287 150 - 400 K/uL   nRBC 0.0 0.0 - 0.2 %    Comment: Performed at [facility], [address] [place], [place] [address]  Urine Drug Screen, Qualitative     Status: Abnormal   Collection Time: 09/09/20  9:20 PM  Result Value Ref Range   Tricyclic, Ur Screen NONE  DETECTED NONE DETECTED   Amphetamines, Ur Screen NONE DETECTED NONE DETECTED   MDMA (Ecstasy)Ur Screen NONE DETECTED NONE DETECTED   Cocaine Metabolite,Ur [place] POSITIVE (A) NONE DETECTED   Opiate, Ur Screen POSITIVE (A) NONE DETECTED   Phencyclidine (PCP) Ur S NONE DETECTED NONE DETECTED   Cannabinoid 50 Ng, Ur [place] POSITIVE (A) NONE DETECTED   Barbiturates, Ur Screen NONE DETECTED NONE DETECTED   Benzodiazepine, Ur Scrn NONE DETECTED NONE DETECTED   Methadone Scn, Ur NONE DETECTED NONE DETECTED    Comment: (NOTE) Tricyclics + metabolites, urine    Cutoff 1000 ng/mL Amphetamines + metabolites, urine  Cutoff 1000 ng/mL MDMA (Ecstasy), urine              Cutoff 500 ng/mL Cocaine Metabolite, urine          Cutoff 300 ng/mL Opiate + metabolites, urine        Cutoff 300 ng/mL Phencyclidine (PCP), urine         Cutoff 25 ng/mL Cannabinoid, urine                 Cutoff 50 ng/mL Barbiturates + metabolites, urine  Cutoff 200 ng/mL Benzodiazepine, urine              Cutoff 200 ng/mL Methadone, urine                   Cutoff 300 ng/mL  The urine drug screen provides only a preliminary, unconfirmed analytical test result and should not be used for non-medical purposes. Clinical consideration and professional judgment should be applied to any positive drug screen result due to possible interfering substances. A more specific alternate chemical method must be used in order to obtain a confirmed analytical result. Gas chromatography / mass spectrometry (GC/MS) is the preferred confirm atory method. Performed at [facility], [address], [place], [place] [address]   POC urine preg, ED     Status: None   Collection Time: 09/09/20  9:43 PM  Result Value Ref Range   Preg Test, Ur NEGATIVE NEGATIVE    Comment:        THE SENSITIVITY OF THIS METHODOLOGY IS >24 mIU/mL   Acetaminophen level     Status: Abnormal   Collection Time: 09/09/20 10:23 PM  Result Value Ref Range    Acetaminophen (Tylenol), Serum <10 (L) 10 - 30 ug/mL    Comment: (NOTE) Therapeutic concentrations vary significantly. A range of 10-30 ug/mL  may be an effective concentration for many patients. However, some  are best treated at concentrations outside of this range. Acetaminophen concentrations >150 ug/mL at 4 hours after ingestion  and >50 ug/mL at 12 hours after ingestion are often associated with  toxic reactions.  Performed at [facility], [address] [place], [place] [address]   Ethanol     Status: None   Collection Time: 09/09/20 10:23 PM  Result Value Ref Range   Alcohol, Ethyl (B) <10 <10 mg/dL    Comment: (NOTE) Lowest detectable limit for serum alcohol is 10 mg/dL.  For medical purposes only. Performed at [facility], [address] [place], [place] [address]   Salicylate level     Status: Abnormal   Collection Time: 09/09/20 10:23 PM  Result Value Ref Range   Salicylate Lvl <0.3 (L) 7.0 - 30.0 mg/dL    Comment: Performed at [facility], [address] [place], [place] [address]  Respiratory Panel by RT PCR (Flu A&B, Covid) - Nasopharyngeal Swab     Status: None   Collection Time: 09/10/20  9:47 AM   Specimen: Nasopharyngeal Swab  Result Value Ref Range   SARS Coronavirus 2 by RT PCR NEGATIVE NEGATIVE    Comment: (NOTE) SARS-CoV-2 target nucleic acids are NOT DETECTED.  The SARS-CoV-2 RNA is generally detectable in upper respiratoy specimens during the acute phase of infection. The lowest concentration of SARS-CoV-2 viral copies this assay can detect is 131 copies/mL. A negative result does not preclude SARS-Cov-2 infection and should not be used as the sole basis for treatment or other patient management decisions. A negative result may occur with  improper specimen collection/handling, submission of specimen other than nasopharyngeal swab, presence of viral mutation(s) within the areas targeted by this assay, and  inadequate number of viral copies (<131 copies/mL). A negative result must be combined with clinical observations, patient history, and epidemiological information. The expected result is Negative.  Fact Sheet for Patients:  PinkCheek.be  Fact Sheet for Healthcare Providers:  GravelBags.it  This test is no t yet approved or cleared by the [place] FDA and  has been authorized for detection and/or diagnosis of SARS-CoV-2 by FDA under an Emergency Use Authorization (EUA). This EUA will remain  in effect (meaning this test can be used) for the duration of the COVID-19 declaration under Section 564(b)(1) of the Act, 21 U.S.C. section 360bbb-3(b)(1), unless the authorization is terminated or revoked sooner.     Influenza A by PCR NEGATIVE NEGATIVE   Influenza B by PCR NEGATIVE NEGATIVE    Comment: (NOTE) The Xpert Xpress SARS-CoV-2/FLU/RSV assay is intended as an aid in  the diagnosis of influenza from Nasopharyngeal swab specimens and  should not be used as a sole basis for treatment. Nasal washings and  aspirates are unacceptable for Xpert Xpress SARS-CoV-2/FLU/RSV  testing.  Fact Sheet for Patients: PinkCheek.be  Fact Sheet for Healthcare Providers: GravelBags.it  This test is not yet approved or cleared by the [place] FDA and  has been  authorized for detection and/or diagnosis of SARS-CoV-2 by  FDA under an Emergency Use Authorization (EUA). This EUA will remain  in effect (meaning this test can be used) for the duration of the  Covid-19 declaration under Section 564(b)(1) of the Act, 21  U.S.C. section 360bbb-3(b)(1), unless the authorization is  terminated or revoked. Performed at [facility], [address] [place], [place] [address]     Blood Alcohol level:  Lab Results  Component Value Date   [facility] <10 09/09/2020   ETH <10  25/36/6440    Metabolic Disorder Labs:  Lab Results  Component Value Date   HGBA1C 5.0 11/21/2018   MPG 96.8 11/21/2018   MPG 93.93 04/13/2018   No results found for: PROLACTIN Lab Results  Component Value Date   CHOL 196 11/21/2018   TRIG 97 11/21/2018   HDL 53 11/21/2018   CHOLHDL 3.7 11/21/2018   VLDL 19 11/21/2018   LDLCALC 124 (H) 11/21/2018   LDLCALC 117 (H) 04/13/2018    Current Medications: Current Facility-Administered Medications  Medication Dose Route Frequency Provider Last Rate Last Admin  . acetaminophen (TYLENOL) tablet 650 mg  650 mg Oral Q6H PRN Clapacs, [name], MD      . alum & mag hydroxide-simeth (MAALOX/MYLANTA) 200-200-20 MG/5ML suspension 30 mL  30 mL Oral Q4H PRN Clapacs, [name], MD      . ARIPiprazole (ABILIFY) tablet 20 mg  20 mg Oral Daily Clapacs, [name], MD   20 mg at 09/11/20 3474  . buprenorphine-naloxone (SUBOXONE) 8-2 mg per SL tablet 1 tablet  1 tablet Sublingual Daily Clapacs, [name], MD   1 tablet at 09/11/20 [phone]  . divalproex (DEPAKOTE) DR tablet 500 mg  500 mg Oral Q12H Clapacs, [name], MD   500 mg at 09/11/20 0813  . feeding supplement (ENSURE ENLIVE / ENSURE PLUS) liquid 237 mL  237 mL Oral BID BM Salley Scarlet, MD   237 mL at 09/11/20 1355  . lithium carbonate (ESKALITH) CR tablet 450 mg  450 mg Oral Q12H Clapacs, [name], MD   450 mg at 09/11/20 6387  . magnesium hydroxide (MILK OF MAGNESIA) suspension 30 mL  30 mL Oral Daily PRN Clapacs, [name], MD      . Derrill Memo ON 09/12/2020] multivitamin with minerals tablet 1 tablet  1 tablet Oral Daily Salley Scarlet, MD      . nicotine (NICODERM CQ - dosed in mg/24 hours) patch 21 mg  21 mg Transdermal Once Clapacs, [name], MD      . traZODone (DESYREL) tablet 200 mg  200 mg Oral QHS PRN Clapacs, [name], MD   200 mg at 09/10/20 2031   PTA Medications: Medications Prior to Admission  Medication Sig Dispense Refill Last Dose  . ARIPiprazole (ABILIFY) 20 MG tablet Take 1 tablet (20 mg total) by  mouth daily. 30 tablet 1   . medroxyPROGESTERone (DEPO-PROVERA) 150 MG/ML injection Inject 1 mL (150 mg total) into the muscle every 3 (three) months. 1 mL 3   . ondansetron (ZOFRAN ODT) 4 MG disintegrating tablet Take 1 tablet (4 mg total) by mouth every 8 (eight) hours as needed for nausea or vomiting. 15 tablet 0     Musculoskeletal: Strength & Muscle Tone: within normal limits Gait & Station: normal Patient leans: N/A  Psychiatric Specialty Exam: Physical Exam Vitals and nursing note reviewed.  Constitutional:      General: She is in acute distress.  HENT:  Head: Normocephalic and atraumatic.     Right Ear: External ear normal.     Left Ear: External ear normal.     Nose: Nose normal.     Mouth/Throat:     Mouth: Mucous membranes are moist.     Pharynx: Oropharynx is clear.  Eyes:     Extraocular Movements: Extraocular movements intact.     Conjunctiva/sclera: Conjunctivae normal.     Pupils: Pupils are equal, round, and reactive to light.  Cardiovascular:     Rate and Rhythm: Normal rate.     Pulses: Normal pulses.  Pulmonary:     Effort: Pulmonary effort is normal.     Breath sounds: Normal breath sounds.  Abdominal:     General: Abdomen is flat.     Palpations: Abdomen is soft.  Musculoskeletal:        General: No swelling. Normal range of motion.     Cervical back: Normal range of motion and neck supple.  Skin:    General: Skin is warm and dry.  Neurological:     General: No focal deficit present.     Mental Status: She is alert and oriented to person, place, and time.  Psychiatric:        Attention and Perception: Attention and perception normal.        Mood and Affect: Mood is depressed. Affect is tearful.        Speech: Speech normal.        Behavior: Behavior is cooperative.        Thought Content: Thought content includes suicidal ideation.        Cognition and Memory: Cognition and memory normal.        Judgment: Judgment is impulsive.     Review  of Systems  Constitutional: Positive for appetite change, chills and fatigue.  HENT: Negative for rhinorrhea and sore throat.   Eyes: Negative for photophobia and visual disturbance.  Respiratory: Negative for cough and shortness of breath.   Cardiovascular: Negative for chest pain and palpitations.  Gastrointestinal: Negative for constipation, diarrhea, nausea and vomiting.  Endocrine: Negative for cold intolerance and heat intolerance.  Genitourinary: Negative for difficulty urinating and dysuria.  Musculoskeletal: Positive for arthralgias and myalgias.  Skin: Negative for rash and wound.  Allergic/Immunologic: Negative for food allergies and immunocompromised state.  Neurological: Negative for dizziness and headaches.  Hematological: Negative for adenopathy. Does not bruise/bleed easily.  Psychiatric/Behavioral: Positive for dysphoric mood, sleep disturbance and suicidal ideas.    Blood pressure (!) 100/59, pulse 73, temperature 98 F (36.7 C), temperature source Oral, resp. rate 17, height 5\' 1"  (1.549 m), weight 49.9 kg, last menstrual period 09/01/2020, SpO2 100 %.Body mass index is 20.78 kg/m.  General Appearance: Fairly Groomed  Eye Contact:  Fair  Speech:  Clear and Coherent  Volume:  Decreased  Mood:  Depressed and Dysphoric  Affect:  Congruent  Thought Process:  Coherent  Orientation:  Full (Time, Place, and Person)  Thought Content:  Logical  Suicidal Thoughts:  Yes.  without intent/plan  Homicidal Thoughts:  No  Memory:  Immediate;   Fair Recent;   Fair Remote;   Fair  Judgement:  Intact  Insight:  Fair  Psychomotor Activity:  Normal  Concentration:  Concentration: Fair and Attention Span: Fair  Recall:  [employer] of Knowledge:  Fair  Language:  Fair  Akathisia:  Negative  Handed:  Right  AIMS (if indicated):     Assets:  Communication Skills Desire for Improvement  Intimacy Physical Health Resilience Social Support  ADL's:  Intact  Cognition:  WNL   Sleep:  Number of Hours: 7.5       Treatment Plan Summary: Daily contact with patient to assess and evaluate symptoms and progress in treatment and Medication management PLAN OF CARE: Continue inpatient admission. Allow patient to sign in for voluntary treatment. Abilify 20 mg daily, Depakote 500 mg BID, Lithium CR 450 mg BID for bipolar I disorder. Suboxone 1 tab daily for 3 days for acute detox from heroin.    Observation Level/Precautions:  15 minute checks  Laboratory:  Valproic acid level, lithium level, lipid panel, and hemoglobin A1C on Nov 6  Psychotherapy:    Medications:    Consultations:    Discharge Concerns:    Estimated LOS:  Other:     Physician Treatment Plan for Primary Diagnosis: Bipolar I disorder, most recent episode depressed, severe without psychotic features ([place]) Long Term Goal(s): Improvement in symptoms so as ready for discharge  Short Term Goals: Ability to identify changes in lifestyle to reduce recurrence of condition will improve, Ability to verbalize feelings will improve, Ability to disclose and discuss suicidal ideas, Ability to demonstrate self-control will improve, Ability to identify and develop effective coping behaviors will improve, Ability to maintain clinical measurements within normal limits will improve, Compliance with prescribed medications will improve and Ability to identify triggers associated with substance abuse/mental health issues will improve  Physician Treatment Plan for Secondary Diagnosis: Principal Problem:   Bipolar I disorder, most recent episode depressed, severe without psychotic features ([place]) Active Problems:   Tobacco use disorder   Amphetamine use disorder, severe (HCC)   Opioid use disorder, severe, dependence ([place])   Cannabis use disorder, severe, dependence ([place])  Long Term Goal(s): Improvement in symptoms so as ready for discharge  Short Term Goals: Ability to identify changes in lifestyle to reduce recurrence of  condition will improve, Ability to verbalize feelings will improve, Ability to disclose and discuss suicidal ideas, Ability to demonstrate self-control will improve, Ability to identify and develop effective coping behaviors will improve, Ability to maintain clinical measurements within normal limits will improve, Compliance with prescribed medications will improve and Ability to identify triggers associated with substance abuse/mental health issues will improve  I certify that inpatient services furnished can reasonably be expected to improve the patient's condition.    Salley Scarlet, MD 11/4/20212:29 PM
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
## 2020-09-11 NOTE — Plan of Care (Signed)
Patient stated that she feels tired and have generalized body aches.Patient stayed in bed.Stated that his depression is getting better.Denies SI,HI and AVH.Compliant with medications.Appetite fair.Encouraged fluids.Support and encouragement given.

## 2020-09-11 NOTE — BHH Group Notes (Signed)
LCSW Group Therapy Note  09/11/2020 2:33 PM  Type of Therapy/Topic:  Group Therapy:  Balance in Life  Participation Level:  Did Not Attend  Description of Group:    This group will address the concept of balance and how it feels and looks when one is unbalanced. Patients will be encouraged to process areas in their lives that are out of balance and identify reasons for remaining unbalanced. Facilitators will guide patients in utilizing problem-solving interventions to address and correct the stressor making their life unbalanced. Understanding and applying boundaries will be explored and addressed for obtaining and maintaining a balanced life. Patients will be encouraged to explore ways to assertively make their unbalanced needs known to significant others in their lives, using other group members and facilitator for support and feedback.  Therapeutic Goals: 1. Patient will identify two or more emotions or situations they have that consume much of in their lives. 2. Patient will identify signs/triggers that life has become out of balance:  3. Patient will identify two ways to set boundaries in order to achieve balance in their lives:  4. Patient will demonstrate ability to communicate their needs through discussion and/or role plays  Summary of Patient Progress: X  Therapeutic Modalities:   Cognitive Behavioral Therapy Solution-Focused Therapy Assertiveness Training  Hedy Camara R. Guerry Bruin, MSW, [place], [place] 09/11/2020 2:33 PM

## 2020-09-11 NOTE — Plan of Care (Signed)
  Problem: Education: Goal: Emotional status will improve Outcome: Not Progressing Goal: Mental status will improve Outcome: Not Applicable   Problem: Activity: Goal: Sleeping patterns will improve Outcome: Progressing   Problem: Safety: Goal: Periods of time without injury will increase Outcome: Progressing

## 2020-09-11 NOTE — BHH Suicide Risk Assessment (Signed)
[place] INPATIENT:  Family/Significant Other Suicide Prevention Education  Suicide Prevention Education:  Patient Refusal for Family/Significant Other Suicide Prevention Education: The patient Autumn [name] has refused to provide written consent for family/significant other to be provided Family/Significant Other Suicide Prevention Education during admission and/or prior to discharge.  Physician notified.  SPE completed with pt, as pt refused to consent to family contact. SPI pamphlet provided to pt and pt was encouraged to share information with support network, ask questions, and talk about any concerns relating to SPE. Pt denies access to guns/firearms and verbalized understanding of information provided. Mobile Crisis information also provided to pt.  Shirl Harris 09/11/2020, 11:34 AM

## 2020-09-11 NOTE — Progress Notes (Signed)
Initial Nutrition Assessment  DOCUMENTATION CODES:   Not applicable  INTERVENTION:   Ensure Enlive po BID, each supplement provides 350 kcal and 20 grams of protein  MVI daily  NUTRITION DIAGNOSIS:   Predicted suboptimal nutrient intake related to social / environmental circumstances (depression and substance abuse) as evidenced by other (comment) (per chart review).  GOAL:   Patient will meet greater than or equal to 90% of their needs  MONITOR:   PO intake, Supplement acceptance  REASON FOR ASSESSMENT:   Malnutrition Screening Tool    ASSESSMENT:   44 year old woman with a history of bipolar disorder and substance abuse who is admitted with depression and suicidal thoughts   Suspect pt with decreased appetite and oral intake pta r/t substance abuse and depression. There are no documented intakes from this admit in chart. RD will add supplements and MVI to help pt meet her estimated needs. Per chart, pt appears weight stable at baseline.   Medications reviewed and include: nicotine   Labs reviewed:    Diet Order:   Diet Order            Diet regular Room service appropriate? Yes; Fluid consistency: Thin  Diet effective now                EDUCATION NEEDS:   No education needs have been identified at this time  Skin:  Skin Assessment: Reviewed RN Assessment  Last BM:  pta  Height:   Ht Readings from Last 1 Encounters:  09/10/20 5\' 1"  (1.549 m)    Weight:   Wt Readings from Last 1 Encounters:  09/10/20 49.9 kg    Ideal Body Weight:  47.7 kg  BMI:  Body mass index is 20.78 kg/m.  Estimated Nutritional Needs:   Kcal:  1400-1600kcal/day  Protein:  70-80g/day  Fluid:  1.4-1.6L/day  Koleen Distance MS, RD, LDN Please refer to [facility] for RD and/or RD on-call/weekend/after hours pager
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
## 2020-09-11 NOTE — Progress Notes (Signed)
Patient pleasant and cooperative. Stayed in room most of night other than with telephone calls. Denies SI, HI, AVH. Minimal interaction with staff and peers. Forwards minimal. Encouragement and support provided. Safety checks maintained. Medications given as prescribed. Pt receptive and remains safe on  Unit with q 15 min checks.

## 2020-09-11 NOTE — Progress Notes (Signed)
Recreation Therapy Notes   Date: 09/11/2020  Time: 9:30 am   Location: Craft room     Behavioral response: N/A   Intervention Topic: Leisure   Discussion/Intervention: Patient did not attend group.   Clinical Observations/Feedback:  Patient did not attend group.   Kyndal Heringer LRT/CTRS        Tomorrow [place] 09/11/2020 12:42 PM

## 2020-09-11 NOTE — BHH Group Notes (Signed)
[place] Group Notes:  (Nursing/MHT/Case Management/Adjunct)  Date:  09/11/2020  Time:  10:38 PM  Type of Therapy:  Group Therapy  Participation Level:  Did Not Attend   Autumn [name] 09/11/2020, 10:38 PM

## 2020-09-11 NOTE — Progress Notes (Signed)
Recreation Therapy Notes  INPATIENT RECREATION TR PLAN  Patient Details Name: Autumn [name] MRN: [id number] DOB: 1976/06/08 Today's Date: 09/11/2020  Rec Therapy Plan Is patient appropriate for Therapeutic Recreation?: Yes Treatment times per week: at least 3 Estimated Length of Stay: 5-7 days TR Treatment/Interventions: Group participation (Comment)  Discharge Criteria    Discharge Summary     Autumn [name] 09/11/2020, 3:42 PM

## 2020-09-11 NOTE — Progress Notes (Signed)
Recreation Therapy Notes  INPATIENT RECREATION THERAPY ASSESSMENT  Patient Details Name: Autumn [name] MRN: [id number] DOB: May 25, 1976 Today's Date: 09/11/2020       Information Obtained From: Patient  Able to Participate in Assessment/Interview: Yes  Patient Presentation: Responsive  Reason for Admission (Per Patient): Active Symptoms, Substance Abuse  Patient Stressors:    Coping Skills:   Talk, TV  Leisure Interests (2+):  Social - Friends, Medical laboratory scientific officer - Geneticist, molecular, Individual - Reading  Frequency of Recreation/Participation: Monthly  Awareness of Community Resources:  Yes  Community Resources:  Library  Current Use:    If no, Barriers?:    Expressed Interest in [place] of Residence:  Insurance underwriter  Patient Main Form of Transportation: Musician  Patient Strengths:  Generous, good listener, good friend  Patient Identified Areas of Improvement:  N/A  Patient Goal for Hospitalization:  Go to more groups  Current SI (including self-harm):  No  Current HI:  No  Current AVH: No  Staff Intervention Plan: Group Attendance, Collaborate with Interdisciplinary Treatment Team  Consent to Intern Participation: N/A  Jariel Drost 09/11/2020, 3:41 PM

## 2020-09-12 DIAGNOSIS — F314 Bipolar disorder, current episode depressed, severe, without psychotic features: Secondary | ICD-10-CM | POA: Diagnosis not present

## 2020-09-12 MED ORDER — ONDANSETRON HCL 4 MG PO TABS
8.0000 mg | ORAL_TABLET | Freq: Three times a day (TID) | ORAL | Status: DC | PRN
  Administered 2020-09-12 – 2020-09-13 (×3): 8 mg via ORAL
  Filled 2020-09-12 (×3): qty 2

## 2020-09-12 NOTE — Progress Notes (Signed)
D- Patient alert and oriented. Affect/mood is withdrawn and sullen. Pt denies SI, HI, AVH, and pain. Pt did not attend groups.  A- Scheduled medications administered to patient, per MD orders. Support and encouragement provided.  Routine safety checks conducted every 15 minutes.  Patient informed to notify staff with problems or concerns.  R- No adverse drug reactions noted. Patient contracts for safety at this time. Patient compliant with medications and treatment plan. Patient receptive, calm, and cooperative. Patient interacts well with others on the unit.  Patient remains safe at this time.  Collier Bullock RN

## 2020-09-12 NOTE — Progress Notes (Signed)
Cooperative with treatment. Patient spent most of shift resting in bed. She remains sad ,anxious and depressed. She was medication complaint. She appears to be in bed resting quietly at this time.

## 2020-09-12 NOTE — Tx Team (Signed)
Interdisciplinary Treatment and Diagnostic Plan Update  09/12/2020 Time of Session: 9:00AM Autumn [name] MRN: [id number]  Principal Diagnosis: Bipolar I disorder, most recent episode depressed, severe without psychotic features ([place])  Secondary Diagnoses: Principal Problem:   Bipolar I disorder, most recent episode depressed, severe without psychotic features ([place]) Active Problems:   Tobacco use disorder   Amphetamine use disorder, severe (HCC)   Opioid use disorder, severe, dependence ([place])   Cannabis use disorder, severe, dependence ([place])   Current Medications:  Current Facility-Administered Medications  Medication Dose Route Frequency Provider Last Rate Last Admin  . acetaminophen (TYLENOL) tablet 650 mg  650 mg Oral Q6H PRN Clapacs, [name], MD      . alum & mag hydroxide-simeth (MAALOX/MYLANTA) 200-200-20 MG/5ML suspension 30 mL  30 mL Oral Q4H PRN Clapacs, [name], MD      . ARIPiprazole (ABILIFY) tablet 20 mg  20 mg Oral Daily Clapacs, [name], MD   20 mg at 09/12/20 0809  . divalproex (DEPAKOTE) DR tablet 500 mg  500 mg Oral Q12H Clapacs, [name], MD   500 mg at 09/12/20 0809  . feeding supplement (ENSURE ENLIVE / ENSURE PLUS) liquid 237 mL  237 mL Oral BID BM Salley Scarlet, MD   237 mL at 09/11/20 1355  . lithium carbonate (ESKALITH) CR tablet 450 mg  450 mg Oral Q12H Clapacs, [name], MD   450 mg at 09/12/20 0809  . magnesium hydroxide (MILK OF MAGNESIA) suspension 30 mL  30 mL Oral Daily PRN Clapacs, [name], MD      . multivitamin with minerals tablet 1 tablet  1 tablet Oral Daily Salley Scarlet, MD   1 tablet at 09/12/20 0809  . nicotine (NICODERM CQ - dosed in mg/24 hours) patch 21 mg  21 mg Transdermal Once Clapacs, [name], MD   21 mg at 09/11/20 1632  . traZODone (DESYREL) tablet 200 mg  200 mg Oral QHS PRN Clapacs, [name], MD   200 mg at 09/11/20 2117   PTA Medications: Medications Prior to Admission  Medication Sig Dispense Refill Last Dose  . ARIPiprazole (ABILIFY)  20 MG tablet Take 1 tablet (20 mg total) by mouth daily. 30 tablet 1   . medroxyPROGESTERone (DEPO-PROVERA) 150 MG/ML injection Inject 1 mL (150 mg total) into the muscle every 3 (three) months. 1 mL 3   . ondansetron (ZOFRAN ODT) 4 MG disintegrating tablet Take 1 tablet (4 mg total) by mouth every 8 (eight) hours as needed for nausea or vomiting. 15 tablet 0     Patient Stressors:    Patient Strengths:    Treatment Modalities: Medication Management, Group therapy, Case management,  1 to 1 session with clinician, Psychoeducation, Recreational therapy.   Physician Treatment Plan for Primary Diagnosis: Bipolar I disorder, most recent episode depressed, severe without psychotic features ([place]) Long Term Goal(s): Improvement in symptoms so as ready for discharge Improvement in symptoms so as ready for discharge   Short Term Goals: Ability to identify changes in lifestyle to reduce recurrence of condition [name] improve Ability to verbalize feelings [name] improve Ability to disclose and discuss suicidal ideas Ability to demonstrate self-control [name] improve Ability to identify and develop effective coping behaviors [name] improve Ability to maintain clinical measurements within normal limits [name] improve Compliance with prescribed medications [name] improve Ability to identify triggers associated with substance abuse/mental health issues [name] improve Ability to identify changes in lifestyle to reduce recurrence of condition [name] improve Ability to verbalize feelings  [name] improve Ability to disclose and discuss suicidal ideas Ability to demonstrate self-control [name] improve Ability to identify and develop effective coping behaviors [name] improve Ability to maintain clinical measurements within normal limits [name] improve Compliance with prescribed medications [name] improve Ability to identify triggers associated with substance abuse/mental health issues [name] improve  Medication Management: Evaluate  patient's response, side effects, and tolerance of medication regimen.  Therapeutic Interventions: 1 to 1 sessions, Unit Group sessions and Medication administration.  Evaluation of Outcomes: Not Progressing  Physician Treatment Plan for Secondary Diagnosis: Principal Problem:   Bipolar I disorder, most recent episode depressed, severe without psychotic features ([place]) Active Problems:   Tobacco use disorder   Amphetamine use disorder, severe (HCC)   Opioid use disorder, severe, dependence ([place])   Cannabis use disorder, severe, dependence ([place])  Long Term Goal(s): Improvement in symptoms so as ready for discharge Improvement in symptoms so as ready for discharge   Short Term Goals: Ability to identify changes in lifestyle to reduce recurrence of condition [name] improve Ability to verbalize feelings [name] improve Ability to disclose and discuss suicidal ideas Ability to demonstrate self-control [name] improve Ability to identify and develop effective coping behaviors [name] improve Ability to maintain clinical measurements within normal limits [name] improve Compliance with prescribed medications [name] improve Ability to identify triggers associated with substance abuse/mental health issues [name] improve Ability to identify changes in lifestyle to reduce recurrence of condition [name] improve Ability to verbalize feelings [name] improve Ability to disclose and discuss suicidal ideas Ability to demonstrate self-control [name] improve Ability to identify and develop effective coping behaviors [name] improve Ability to maintain clinical measurements within normal limits [name] improve Compliance with prescribed medications [name] improve Ability to identify triggers associated with substance abuse/mental health issues [name] improve     Medication Management: Evaluate patient's response, side effects, and tolerance of medication regimen.  Therapeutic Interventions: 1 to 1 sessions, Unit Group sessions and  Medication administration.  Evaluation of Outcomes: Not Progressing   RN Treatment Plan for Primary Diagnosis: Bipolar I disorder, most recent episode depressed, severe without psychotic features ([place]) Long Term Goal(s): Knowledge of disease and therapeutic regimen to maintain health [name] improve  Short Term Goals: Ability to participate in decision making [name] improve, Ability to verbalize feelings [name] improve, Ability to identify and develop effective coping behaviors [name] improve and Compliance with prescribed medications [name] improve  Medication Management: RN [name] administer medications as ordered by provider, [name] assess and evaluate patient's response and provide education to patient for prescribed medication. RN [name] report any adverse and/or side effects to prescribing provider.  Therapeutic Interventions: 1 on 1 counseling sessions, Psychoeducation, Medication administration, Evaluate responses to treatment, Monitor vital signs and CBGs as ordered, Perform/monitor CIWA, COWS, AIMS and Fall Risk screenings as ordered, Perform wound care treatments as ordered.  Evaluation of Outcomes: Not Progressing   LCSW Treatment Plan for Primary Diagnosis: Bipolar I disorder, most recent episode depressed, severe without psychotic features ([place]) Long Term Goal(s): Safe transition to appropriate next level of care at discharge, Engage patient in therapeutic group addressing interpersonal concerns.  Short Term Goals: Engage patient in aftercare planning with referrals and resources, Increase social support, Increase ability to appropriately verbalize feelings, Increase emotional regulation, Facilitate acceptance of mental health diagnosis and concerns, Facilitate patient progression through stages of change regarding substance use diagnoses and concerns, Identify triggers associated with mental health/substance abuse issues and Increase skills for wellness and recovery  Therapeutic Interventions:  Assess for all discharge needs,  1 to 1 time with Education officer, museum, Explore available resources and support systems, Assess for adequacy in community support network, Educate family and significant other(s) on suicide prevention, Complete Psychosocial Assessment, Interpersonal group therapy.  Evaluation of Outcomes: Not Progressing   Progress in Treatment: Attending groups: No. Participating in groups: No. Taking medication as prescribed: Yes. Toleration medication: Yes. Family/Significant other contact made: No, [name] contact:  when given permission. Patient understands diagnosis: Yes. Discussing patient identified problems/goals with staff: Yes. Medical problems stabilized or resolved: Yes. Denies suicidal/homicidal ideation: Yes. Issues/concerns per patient self-inventory: No. Other: None.  New problem(s) identified: No, Describe:  none.  New Short Term/Long Term Goal(s): detox, medication management for mood stabilization; elimination of SI thoughts; development of comprehensive mental wellness/sobriety plan.  Patient Goals: "Try to feel better so I can go to groups."  Discharge Plan or Barriers: Pt plans to discharge home with aftercare through [facility] due to needing evening/night groups.  Reason for Continuation of Hospitalization: Depression Medication stabilization Withdrawal symptoms  Estimated Length of Stay: 1-7 days  Attendees: Patient: Autumn [name] 09/12/2020 10:02 AM  Physician: Selina Cooley, MD 09/12/2020 10:02 AM  Nursing: Collier Bullock, RN 09/12/2020 10:02 AM  RN Care Manager: 09/12/2020 10:02 AM  Social Worker: Assunta Curtis, MSW, LCSW 09/12/2020 10:02 AM  Recreational Therapist: Roanna Epley, Reather Converse, LRT 09/12/2020 10:02 AM  Other: Chalmers Guest. Guerry Bruin, MSW, [place], [place] 09/12/2020 10:02 AM  Other:  09/12/2020 10:02 AM  Other: 09/12/2020 10:02 AM    Scribe for Treatment Team: Shirl Harris, LCSW 09/12/2020 10:02 AM

## 2020-09-12 NOTE — [facility] Group Notes (Signed)
LCSW Group Therapy Note  09/12/2020 2:46 PM  Type of Therapy/Topic:  Group Therapy:  Emotion Regulation  Participation Level:  Did Not Attend   Description of Group:   The purpose of this group is to assist patients in learning to regulate negative emotions and experience positive emotions. Patients will be guided to discuss ways in which they have been vulnerable to their negative emotions. These vulnerabilities will be juxtaposed with experiences of positive emotions or situations, and patients will be challenged to use positive emotions to combat negative ones. Special emphasis will be placed on coping with negative emotions in conflict situations, and patients will process healthy conflict resolution skills.  Therapeutic Goals: 1. Patient will identify two positive emotions or experiences to reflect on in order to balance out negative emotions 2. Patient will label two or more emotions that they find the most difficult to experience 3. Patient will demonstrate positive conflict resolution skills through discussion and/or role plays  Summary of Patient Progress: X  Therapeutic Modalities:   Cognitive Behavioral Therapy Feelings Identification Dialectical Behavioral Therapy  Assunta Curtis, MSW, LCSW 09/12/2020 2:46 PM

## 2020-09-12 NOTE — Progress Notes (Signed)
Recreation Therapy Notes   Date: 09/12/2020  Time: 9:30 am   Location: Craft room     Behavioral response: N/A   Intervention Topic: Emotions   Discussion/Intervention: Patient did not attend group.   Clinical Observations/Feedback:  Patient did not attend group.   Manasvini Whatley LRT/CTRS           Dema Timmons 09/12/2020 11:53 AM

## 2020-09-12 NOTE — Progress Notes (Signed)
Pt complains of anxiety and depression. Pt denies depression, anxiety and AVH. Pt was educated on care plan and verbalizes understanding.Pt was encouraged to attend groups. Collier Bullock RN

## 2020-09-12 NOTE — Progress Notes (Signed)
[facility] MD Progress Note  09/12/2020 11:50 AM Autumn [name]  MRN:  [id number] Subjective:  Patient seen during treatment team, and again one-on-one at bedside. Today she states she is feeling somewhat better, but she continues to experience acute withdrawal symptoms. She endorses chills, nausea, vomiting, and body aches today. She was initially out of bed this morning for breakfast and to interact with peers, however, quickly had to return to bed due to nausea. Her mood remains depressed at this time. She continues to have thoughts of being better off dead, but contracts for safety in the hospital. She is tolerating her medications well. Will check lithium level, depakote level, lipid panel, and hemoglobin A1c tomorrow morning. She is hopeful to attend [facility] for outpatient substance abuse treatment once stabilize on medications, and finished with acute detox.  Principal Problem: Bipolar I disorder, most recent episode depressed, severe without psychotic features ([place]) Diagnosis: Principal Problem:   Bipolar I disorder, most recent episode depressed, severe without psychotic features ([place]) Active Problems:   Tobacco use disorder   Amphetamine use disorder, severe (HCC)   Opioid use disorder, severe, dependence ([place])   Cannabis use disorder, severe, dependence ([place])  Total Time spent with patient: 45 minutes  Past Psychiatric History: Patient has a history of bipolar disorder and has presented with mixed psychosis and depression in the past. Has done well on mood stabilizers but is currently noncompliant. History of recurrent substance abuse problems especially opiates in the past. When compliant with medication and treatment has had some stability before.  Past Medical History:  Past Medical History:  Diagnosis Date  . Allergy    Seasonal, Ultram (seizures)  . Anemia 2005  . Bipolar 1 disorder ([place])   . Glaucoma   . Seizures ([place]) 2008    Past Surgical History:  Procedure Laterality Date   . BREAST BIOPSY Right    x 2  . CESAREAN SECTION     x 2  . LEEP  2000   Family History:  Family History  Problem Relation Age of Onset  . Hypertension Mother   . Hyperlipidemia Mother   . Asthma Mother   . Parkinson's disease Father   . Lung cancer Maternal Grandmother   . Other Paternal Grandfather 35       Cardiac arrest  . Heart disease Paternal Grandfather   . Tourette syndrome Daughter    Family Psychiatric  History: None reported Social History:  Social History   Substance and Sexual Activity  Alcohol Use Yes  . Alcohol/week: 4.0 standard drinks  . Types: 4 Cans of beer per week   Comment: Ocassional     Social History   Substance and Sexual Activity  Drug Use Yes  . Types: Marijuana   Comment: percocet -last use over 1 week ago. Heroin-last used 2/11    Social History   Socioeconomic History  . Marital status: Legally Separated    Spouse name: Not on file  . Number of children: Not on file  . Years of education: Not on file  . Highest education level: Not on file  Occupational History  . Not on file  Tobacco Use  . Smoking status: Current Every Day Smoker    Packs/day: 1.00    Years: 9.00    Pack years: 9.00    Types: E-cigarettes  . Smokeless tobacco: Never Used  Vaping Use  . Vaping Use: Never used  Substance and Sexual Activity  . Alcohol use: Yes    Alcohol/week: 4.0 standard  drinks    Types: 4 Cans of beer per week    Comment: Ocassional  . Drug use: Yes    Types: Marijuana    Comment: percocet -last use over 1 week ago. Heroin-last used 2/11  . Sexual activity: Yes    Birth control/protection: None  Other Topics Concern  . Not on file  Social History Narrative  . Not on file   Social Determinants of Health   Financial Resource Strain:   . Difficulty of Paying Living Expenses: Not on file  Food Insecurity:   . Worried About Charity fundraiser in the Last Year: Not on file  . Ran Out of Food in the Last Year: Not on file   Transportation Needs:   . Lack of Transportation (Medical): Not on file  . Lack of Transportation (Non-Medical): Not on file  Physical Activity:   . Days of Exercise per Week: Not on file  . Minutes of Exercise per Session: Not on file  Stress:   . Feeling of Stress : Not on file  Social Connections:   . Frequency of Communication with Friends and Family: Not on file  . Frequency of Social Gatherings with Friends and Family: Not on file  . Attends Religious Services: Not on file  . Active Member of Clubs or Organizations: Not on file  . Attends Archivist Meetings: Not on file  . Marital Status: Not on file   Additional Social History:      Sleep: Fair  Appetite:  Poor  Current Medications: Current Facility-Administered Medications  Medication Dose Route Frequency Provider Last Rate Last Admin  . acetaminophen (TYLENOL) tablet 650 mg  650 mg Oral Q6H PRN Clapacs, [name], MD      . alum & mag hydroxide-simeth (MAALOX/MYLANTA) 200-200-20 MG/5ML suspension 30 mL  30 mL Oral Q4H PRN Clapacs, [name], MD      . ARIPiprazole (ABILIFY) tablet 20 mg  20 mg Oral Daily Clapacs, [name], MD   20 mg at 09/12/20 0809  . divalproex (DEPAKOTE) DR tablet 500 mg  500 mg Oral Q12H Clapacs, [name], MD   500 mg at 09/12/20 0809  . feeding supplement (ENSURE ENLIVE / ENSURE PLUS) liquid 237 mL  237 mL Oral BID BM Salley Scarlet, MD   237 mL at 09/11/20 1355  . lithium carbonate (ESKALITH) CR tablet 450 mg  450 mg Oral Q12H Clapacs, [name], MD   450 mg at 09/12/20 0809  . magnesium hydroxide (MILK OF MAGNESIA) suspension 30 mL  30 mL Oral Daily PRN Clapacs, [name], MD      . multivitamin with minerals tablet 1 tablet  1 tablet Oral Daily Salley Scarlet, MD   1 tablet at 09/12/20 0809  . nicotine (NICODERM CQ - dosed in mg/24 hours) patch 21 mg  21 mg Transdermal Once Clapacs, [name], MD   21 mg at 09/11/20 1632  . ondansetron (ZOFRAN) tablet 8 mg  8 mg Oral Q8H PRN Salley Scarlet, MD       . traZODone (DESYREL) tablet 200 mg  200 mg Oral QHS PRN Clapacs, [name], MD   200 mg at 09/11/20 2117    Lab Results: No results found for this or any previous visit (from the past 48 hour(s)).  Blood Alcohol level:  Lab Results  Component Value Date   [facility] <10 09/09/2020   ETH <10 17/40/8144    Metabolic Disorder Labs: Lab Results  Component  Value Date   HGBA1C 5.0 11/21/2018   MPG 96.8 11/21/2018   MPG 93.93 04/13/2018   No results found for: PROLACTIN Lab Results  Component Value Date   CHOL 196 11/21/2018   TRIG 97 11/21/2018   HDL 53 11/21/2018   CHOLHDL 3.7 11/21/2018   VLDL 19 11/21/2018   LDLCALC 124 (H) 11/21/2018   LDLCALC 117 (H) 04/13/2018    Physical Findings: AIMS:  , ,  ,  ,    CIWA:    COWS:     Musculoskeletal: Strength & Muscle Tone: within normal limits Gait & Station: normal Patient leans: N/A  Psychiatric Specialty Exam: Physical Exam Vitals and nursing note reviewed.  Constitutional:      Appearance: She is ill-appearing.  HENT:     Head: Normocephalic and atraumatic.     Right Ear: External ear normal.     Left Ear: External ear normal.     Nose: Nose normal.     Mouth/Throat:     Mouth: Mucous membranes are moist.     Pharynx: Oropharynx is clear.  Eyes:     Extraocular Movements: Extraocular movements intact.     Conjunctiva/sclera: Conjunctivae normal.     Pupils: Pupils are equal, round, and reactive to light.  Cardiovascular:     Rate and Rhythm: Normal rate.     Pulses: Normal pulses.  Pulmonary:     Effort: Pulmonary effort is normal.     Breath sounds: Normal breath sounds.  Abdominal:     General: Abdomen is flat.     Palpations: Abdomen is soft.  Musculoskeletal:        General: No swelling. Normal range of motion.     Cervical back: Normal range of motion and neck supple.  Skin:    General: Skin is warm and dry.  Neurological:     General: No focal deficit present.     Mental Status: She is alert and  oriented to person, place, and time.  Psychiatric:        Attention and Perception: Attention and perception normal.        Mood and Affect: Mood is depressed. Affect is tearful.        Speech: Speech normal.        Behavior: Behavior is cooperative.        Thought Content: Thought content includes suicidal ideation.        Cognition and Memory: Cognition and memory normal.        Judgment: Judgment normal.     Review of Systems  Constitutional: Positive for chills and fatigue.  HENT: Positive for rhinorrhea. Negative for sore throat.   Eyes: Negative for photophobia and visual disturbance.  Respiratory: Negative for cough and shortness of breath.   Cardiovascular: Negative for chest pain and palpitations.  Gastrointestinal: Positive for nausea and vomiting. Negative for constipation and diarrhea.  Endocrine: Positive for cold intolerance. Negative for heat intolerance.  Genitourinary: Negative for difficulty urinating and dysuria.  Musculoskeletal: Positive for arthralgias and myalgias.  Skin: Negative for rash and wound.  Allergic/Immunologic: Negative for food allergies and immunocompromised state.  Neurological: Negative for dizziness and headaches.  Hematological: Negative for adenopathy. Does not bruise/bleed easily.  Psychiatric/Behavioral: Positive for dysphoric mood and suicidal ideas.    Blood pressure 91/70, pulse 88, temperature (!) 97.5 F (36.4 C), temperature source Oral, resp. rate 17, height 5\' 1"  (1.549 m), weight 49.9 kg, last menstrual period 09/01/2020, SpO2 96 %.Body mass index is 20.78 kg/m.  General Appearance: Casual and Fairly Groomed  Eye Contact:  Good  Speech:  Clear and Coherent  Volume:  Normal  Mood:  Depressed and Dysphoric  Affect:  Congruent and Depressed  Thought Process:  Coherent  Orientation:  Full (Time, Place, and Person)  Thought Content:  Logical  Suicidal Thoughts:  Yes.  without intent/plan  Homicidal Thoughts:  No  Memory:   Immediate;   Fair Recent;   Fair Remote;   Fair  Judgement:  Fair  Insight:  Fair  Psychomotor Activity:  Normal  Concentration:  Concentration: Fair and Attention Span: Fair  Recall:  [employer] of Knowledge:  Fair  Language:  Fair  Akathisia:  Negative  Handed:  Right  AIMS (if indicated):     Assets:  Communication Skills Desire for Improvement Housing Intimacy Resilience Social Support  ADL's:  Intact  Cognition:  WNL  Sleep:  Number of Hours: 8     Treatment Plan Summary: Daily contact with patient to assess and evaluate symptoms and progress in treatment and Medication management PLAN OF CARE:Continue inpatient admission. Abilify 20 mg daily, Depakote 500 mg BID, Lithium CR 450 mg BID for bipolar I disorder. Will check valproic acid level, lithium level, lipid panel, and hemoglobin A1c in the morning. Patient has completed 3 day taper of Suboxone for heroin withdrawals. Zofran 8 mg Q8hr PRN for nausea and vomiting in place.   Salley Scarlet, MD 09/12/2020, 11:50 AM

## 2020-09-13 DIAGNOSIS — F314 Bipolar disorder, current episode depressed, severe, without psychotic features: Secondary | ICD-10-CM | POA: Diagnosis not present

## 2020-09-13 LAB — HEMOGLOBIN A1C
Hgb A1c MFr Bld: 5.4 % (ref 4.8–5.6)
Mean Plasma Glucose: 108.28 mg/dL

## 2020-09-13 LAB — VALPROIC ACID LEVEL: Valproic Acid Lvl: 89 ug/mL (ref 50.0–100.0)

## 2020-09-13 LAB — LIPID PANEL
Cholesterol: 166 mg/dL (ref 0–200)
HDL: 60 mg/dL (ref >40)
LDL Cholesterol: 97 mg/dL (ref 0–99)
Total CHOL/HDL Ratio: 2.8 RATIO
Triglycerides: 45 mg/dL (ref <150)
VLDL: 9 mg/dL (ref 0–40)

## 2020-09-13 LAB — LITHIUM LEVEL: Lithium Lvl: 0.79 mmol/L (ref 0.60–1.20)

## 2020-09-13 MED ORDER — NICOTINE 21 MG/24HR TD PT24
21.0000 mg | MEDICATED_PATCH | Freq: Every day | TRANSDERMAL | Status: DC
  Administered 2020-09-13 – 2020-09-16 (×4): 21 mg via TRANSDERMAL
  Filled 2020-09-13 (×4): qty 1

## 2020-09-13 NOTE — Plan of Care (Signed)
D- Patient alert and oriented. Patient presents in a pleasant mood on assessment stating that she slept ok last night and had no complaints to voice to this Probation officer. Patient denies anxiety, however, she endorsed depression, but stated that it's "a lot better now". Patient also denies SI, HI, AVH, and pain at this time. Patient's goal for today is to "go to groups", in which "take meds", will help her achieve this goal.   A- Scheduled medications administered to patient, per MD orders. Support and encouragement provided.  Routine safety checks conducted every 15 minutes.  Patient informed to notify staff with problems or concerns.  R- No adverse drug reactions noted. Patient contracts for safety at this time. Patient compliant with medications. Patient receptive, calm, and cooperative. Patient interacts well with others on the unit. Patient remains safe at this time.  Problem: Education: Goal: Knowledge of [facility] Education information/materials will improve Outcome: Progressing Goal: Emotional status will improve Outcome: Progressing Goal: Verbalization of understanding the information provided will improve Outcome: Progressing   Problem: Activity: Goal: Interest or engagement in activities will improve Outcome: Progressing Goal: Sleeping patterns will improve Outcome: Progressing   Problem: Coping: Goal: Ability to verbalize frustrations and anger appropriately will improve Outcome: Progressing Goal: Ability to demonstrate self-control will improve Outcome: Progressing   Problem: Health Behavior/Discharge Planning: Goal: Identification of resources available to assist in meeting health care needs will improve Outcome: Progressing Goal: Compliance with treatment plan for underlying cause of condition will improve Outcome: Progressing   Problem: Physical Regulation: Goal: Ability to maintain clinical measurements within normal limits will improve Outcome: Progressing    Problem: Safety: Goal: Periods of time without injury will increase Outcome: Progressing

## 2020-09-13 NOTE — Progress Notes (Signed)
[facility] MD Progress Note  09/13/2020 9:56 AM Autumn [name]  MRN:  [id number] Subjective:   Can I get back on nicotine patch?" Pt reports bodyache and chills from heroin withdrawal, but feels better than yesterday,, pt asking to continue nicotine patch for smoking. Pt anxious abut cooperative.   Her mood remains depressed at this time. Denies suicidal thoughts, intent, plan. She is tolerating her medications well.  lithium level- 0.79, depakote level,-89,  lipid panel-wnl,  and hemoglobin A1c -pending.  She is hopeful to attend [facility] for outpatient substance abuse treatment once stabilize on medications, and finished with acute detox.  Principal Problem: Bipolar I disorder, most recent episode depressed, severe without psychotic features ([place]) Diagnosis: Principal Problem:   Bipolar I disorder, most recent episode depressed, severe without psychotic features ([place]) Active Problems:   Tobacco use disorder   Amphetamine use disorder, severe (HCC)   Opioid use disorder, severe, dependence ([place])   Cannabis use disorder, severe, dependence ([place])  Total Time spent with patient: 45 minutes  Past Psychiatric History: Patient has a history of bipolar disorder and has presented with mixed psychosis and depression in the past. Has done well on mood stabilizers but is currently noncompliant. History of recurrent substance abuse problems especially opiates in the past. When compliant with medication and treatment has had some stability before.  Past Medical History:  Past Medical History:  Diagnosis Date  . Allergy    Seasonal, Ultram (seizures)  . Anemia 2005  . Bipolar 1 disorder ([place])   . Glaucoma   . Seizures ([place]) 2008    Past Surgical History:  Procedure Laterality Date  . BREAST BIOPSY Right    x 2  . CESAREAN SECTION     x 2  . LEEP  2000   Family History:  Family History  Problem Relation Age of Onset  . Hypertension Mother   . Hyperlipidemia Mother   . Asthma Mother   .  Parkinson's disease Father   . Lung cancer Maternal Grandmother   . Other Paternal Grandfather 46       Cardiac arrest  . Heart disease Paternal Grandfather   . Tourette syndrome Daughter    Family Psychiatric  History: None reported Social History:  Social History   Substance and Sexual Activity  Alcohol Use Yes  . Alcohol/week: 4.0 standard drinks  . Types: 4 Cans of beer per week   Comment: Ocassional     Social History   Substance and Sexual Activity  Drug Use Yes  . Types: Marijuana   Comment: percocet -last use over 1 week ago. Heroin-last used 2/11    Social History   Socioeconomic History  . Marital status: Legally Separated    Spouse name: Not on file  . Number of children: Not on file  . Years of education: Not on file  . Highest education level: Not on file  Occupational History  . Not on file  Tobacco Use  . Smoking status: Current Every Day Smoker    Packs/day: 1.00    Years: 9.00    Pack years: 9.00    Types: E-cigarettes  . Smokeless tobacco: Never Used  Vaping Use  . Vaping Use: Never used  Substance and Sexual Activity  . Alcohol use: Yes    Alcohol/week: 4.0 standard drinks    Types: 4 Cans of beer per week    Comment: Ocassional  . Drug use: Yes    Types: Marijuana    Comment: percocet -last use over 1  week ago. Heroin-last used 2/11  . Sexual activity: Yes    Birth control/protection: None  Other Topics Concern  . Not on file  Social History Narrative  . Not on file   Social Determinants of Health   Financial Resource Strain:   . Difficulty of Paying Living Expenses: Not on file  Food Insecurity:   . Worried About Charity fundraiser in the Last Year: Not on file  . Ran Out of Food in the Last Year: Not on file  Transportation Needs:   . Lack of Transportation (Medical): Not on file  . Lack of Transportation (Non-Medical): Not on file  Physical Activity:   . Days of Exercise per Week: Not on file  . Minutes of Exercise per  Session: Not on file  Stress:   . Feeling of Stress : Not on file  Social Connections:   . Frequency of Communication with Friends and Family: Not on file  . Frequency of Social Gatherings with Friends and Family: Not on file  . Attends Religious Services: Not on file  . Active Member of Clubs or Organizations: Not on file  . Attends Archivist Meetings: Not on file  . Marital Status: Not on file   Additional Social History:      Sleep: Fair  Appetite:  Poor  Current Medications: Current Facility-Administered Medications  Medication Dose Route Frequency Provider Last Rate Last Admin  . acetaminophen (TYLENOL) tablet 650 mg  650 mg Oral Q6H PRN Clapacs, [name], MD      . alum & mag hydroxide-simeth (MAALOX/MYLANTA) 200-200-20 MG/5ML suspension 30 mL  30 mL Oral Q4H PRN Clapacs, [name], MD      . ARIPiprazole (ABILIFY) tablet 20 mg  20 mg Oral Daily Clapacs, [name], MD   20 mg at 09/13/20 0858  . divalproex (DEPAKOTE) DR tablet 500 mg  500 mg Oral Q12H Clapacs, [name], MD   500 mg at 09/13/20 0858  . feeding supplement (ENSURE ENLIVE / ENSURE PLUS) liquid 237 mL  237 mL Oral BID BM Salley Scarlet, MD   237 mL at 09/12/20 1400  . lithium carbonate (ESKALITH) CR tablet 450 mg  450 mg Oral Q12H Clapacs, [name], MD   450 mg at 09/13/20 0858  . magnesium hydroxide (MILK OF MAGNESIA) suspension 30 mL  30 mL Oral Daily PRN Clapacs, [name], MD      . multivitamin with minerals tablet 1 tablet  1 tablet Oral Daily Salley Scarlet, MD   1 tablet at 09/13/20 0858  . nicotine (NICODERM CQ - dosed in mg/24 hours) patch 21 mg  21 mg Transdermal Daily Clapacs, [name], MD   21 mg at 09/13/20 0857  . ondansetron (ZOFRAN) tablet 8 mg  8 mg Oral Q8H PRN Salley Scarlet, MD   8 mg at 09/12/20 1658  . traZODone (DESYREL) tablet 200 mg  200 mg Oral QHS PRN Clapacs, [name], MD   200 mg at 09/11/20 2117    Lab Results:  Results for orders placed or performed during the hospital encounter of  09/10/20 (from the past 48 hour(s))  Valproic acid level     Status: None   Collection Time: 09/13/20  6:42 AM  Result Value Ref Range   Valproic Acid Lvl 89 50.0 - 100.0 ug/mL    Comment: Performed at [facility], [address], [place], [place] [address]  Lithium level     Status: None  Collection Time: 09/13/20  6:42 AM  Result Value Ref Range   Lithium Lvl 0.79 0.60 - 1.20 mmol/L    Comment: Performed at [facility], [address] [place], [place] [address]  Lipid panel     Status: None   Collection Time: 09/13/20  6:42 AM  Result Value Ref Range   Cholesterol 166 0 - 200 mg/dL   Triglycerides 45 <150 mg/dL   HDL 60 >40 mg/dL   Total CHOL/HDL Ratio 2.8 RATIO   VLDL 9 0 - 40 mg/dL   LDL Cholesterol 97 0 - 99 mg/dL    Comment:        Total Cholesterol/HDL:CHD Risk Coronary Heart Disease Risk Table                     Men   Women  1/2 Average Risk   3.4   3.3  Average Risk       5.0   4.4  2 X Average Risk   9.6   7.1  3 X Average Risk  23.4   11.0        Use the calculated Patient Ratio above and the CHD Risk Table to determine the patient's CHD Risk.        ATP III CLASSIFICATION (LDL):  <100     mg/dL   Optimal  100-129  mg/dL   Near or Above                    Optimal  130-159  mg/dL   Borderline  160-189  mg/dL   High  >190     mg/dL   Very High Performed at [facility], [address] [place], [place] [address]     Blood Alcohol level:  Lab Results  Component Value Date   [facility] <10 09/09/2020   ETH <10 89/37/3428    Metabolic Disorder Labs: Lab Results  Component Value Date   HGBA1C 5.0 11/21/2018   MPG 96.8 11/21/2018   MPG 93.93 04/13/2018   No results found for: PROLACTIN Lab Results  Component Value Date   CHOL 166 09/13/2020   TRIG 45 09/13/2020   HDL 60 09/13/2020   CHOLHDL 2.8 09/13/2020   VLDL 9 09/13/2020   LDLCALC 97 09/13/2020   LDLCALC 124 (H) 11/21/2018    Physical Findings: AIMS:  , ,  ,  ,     CIWA:    COWS:     Musculoskeletal: Strength & Muscle Tone: within normal limits Gait & Station: normal Patient leans: N/A  Psychiatric Specialty Exam: Physical Exam Vitals and nursing note reviewed.  HENT:     Head: Normocephalic and atraumatic.     Right Ear: External ear normal.     Left Ear: External ear normal.     Nose: Nose normal.     Mouth/Throat:     Mouth: Mucous membranes are moist.     Pharynx: Oropharynx is clear.  Cardiovascular:     Rate and Rhythm: Normal rate.     Pulses: Normal pulses.  Pulmonary:     Effort: Pulmonary effort is normal.     Breath sounds: Normal breath sounds.  Musculoskeletal:        General: No swelling. Normal range of motion.  Neurological:     General: No focal deficit present.     Mental Status: She is alert and oriented to person, place, and time.  Psychiatric:        Attention  and Perception: Attention and perception normal.        Mood and Affect: Mood is depressed. Affect is tearful.        Speech: Speech normal.        Behavior: Behavior is cooperative.        Thought Content: Thought content includes suicidal ideation.        Cognition and Memory: Cognition and memory normal.        Judgment: Judgment normal.     Review of Systems  Constitutional: Positive for chills and fatigue.  HENT: Positive for rhinorrhea. Negative for sore throat.   Eyes: Negative for photophobia and visual disturbance.  Respiratory: Negative for cough and shortness of breath.   Cardiovascular: Negative for chest pain and palpitations.  Gastrointestinal: Positive for nausea and vomiting. Negative for constipation and diarrhea.  Endocrine: Positive for cold intolerance. Negative for heat intolerance.  Genitourinary: Negative for difficulty urinating and dysuria.  Musculoskeletal: Positive for arthralgias and myalgias.  Skin: Negative for rash and wound.  Allergic/Immunologic: Negative for food allergies and immunocompromised state.   Neurological: Negative for dizziness and headaches.  Hematological: Negative for adenopathy. Does not bruise/bleed easily.  Psychiatric/Behavioral: Positive for dysphoric mood and suicidal ideas.    Blood pressure 118/81, pulse 81, temperature 98.6 F (37 C), resp. rate 18, height 5\' 1"  (1.549 m), weight 49.9 kg, last menstrual period 09/01/2020, SpO2 98 %.Body mass index is 20.78 kg/m.  General Appearance: Casual and Fairly Groomed  Eye Contact:  Good  Speech:  Clear and Coherent  Volume:  Normal  Mood:  Depressed and Dysphoric  Affect:  Congruent and Depressed  Thought Process:  Coherent  Orientation:  Full (Time, Place, and Person)  Thought Content:  Logical  Suicidal Thoughts:  denies  Homicidal Thoughts:  No  Memory:  Immediate;   Fair Recent;   Fair Remote;   Fair  Judgement:  Fair  Insight:  Fair  Psychomotor Activity:  Normal  Concentration:  Concentration: Fair and Attention Span: Fair  Recall:  [employer] of Knowledge:  Fair  Language:  Fair  Akathisia:  Negative  Handed:  Right  AIMS (if indicated):     Assets:  Communication Skills Desire for Improvement Housing Intimacy Resilience Social Support  ADL's:  Intact  Cognition:  WNL  Sleep:  Number of Hours: 8.25     Treatment Plan Summary: Daily contact with patient to assess and evaluate symptoms and progress in treatment and Medication management PLAN OF CARE:Continue inpatient admission. Abilify 20 mg daily, Depakote 500 mg BID, Lithium CR 450 mg BID for bipolar I disorder.  lithium level- 0.79, depakote level,-89,  lipid panel-wnl,  and hemoglobin A1c -pending.  Patient has completed 3 day taper of Suboxone for heroin withdrawals. Zofran 8 mg Q8hr PRN for nausea and vomiting in place.   [name], MD 09/13/2020, 9:56 AMPatient ID: Autumn [name], female   DOB: [birth year], 44 y.o.   MRN: [id number]

## 2020-09-13 NOTE — [facility] Group Notes (Signed)
LCSW Aftercare Discharge Planning Group Note   09/13/2020 1:20pm-2:00pm  Type of Group and Topic: Psychoeducational Group:  Discharge Planning  Participation Level:  Did Not Attend  Description of Group  Discharge planning group reviews patient's anticipated discharge plans and assists patients to anticipate and address any barriers to wellness/recovery in the community.  Suicide prevention education is reviewed with patients in group.   Therapeutic Goals 1. Patients will state their anticipated discharge plan and mental health aftercare 2. Patients will identify potential barriers to wellness in the community setting 3. Patients will engage in problem solving, solution focused discussion of ways to anticipate and address barriers to wellness/recovery   Summary of Patient Progress: Did not attend     Therapeutic Modalities: Motivational Interviewing    Raina Mina, Latanya Presser 09/13/2020 2:39 PM

## 2020-09-13 NOTE — Plan of Care (Signed)
  Problem: Education: Goal: Knowledge of [facility] Education information/materials will improve Outcome: Progressing Goal: Emotional status will improve Outcome: Progressing Goal: Verbalization of understanding the information provided will improve Outcome: Progressing   Problem: Activity: Goal: Interest or engagement in activities will improve Outcome: Progressing Goal: Sleeping patterns will improve Outcome: Progressing   Problem: Coping: Goal: Ability to verbalize frustrations and anger appropriately will improve Outcome: Progressing Goal: Ability to demonstrate self-control will improve Outcome: Progressing   Problem: Health Behavior/Discharge Planning: Goal: Identification of resources available to assist in meeting health care needs will improve Outcome: Progressing Goal: Compliance with treatment plan for underlying cause of condition will improve Outcome: Progressing   Problem: Physical Regulation: Goal: Ability to maintain clinical measurements within normal limits will improve Outcome: Progressing   Problem: Safety: Goal: Periods of time without injury will increase Outcome: Progressing

## 2020-09-13 NOTE — Progress Notes (Signed)
Pleasant and cooperative. Sad. Isolates to room. Denies Si, HI and AVH

## 2020-09-14 DIAGNOSIS — F17200 Nicotine dependence, unspecified, uncomplicated: Secondary | ICD-10-CM | POA: Diagnosis not present

## 2020-09-14 DIAGNOSIS — F314 Bipolar disorder, current episode depressed, severe, without psychotic features: Secondary | ICD-10-CM | POA: Diagnosis not present

## 2020-09-14 DIAGNOSIS — F1120 Opioid dependence, uncomplicated: Secondary | ICD-10-CM

## 2020-09-14 NOTE — Plan of Care (Signed)
  Problem: Education: Goal: Knowledge of [facility] Education information/materials will improve Outcome: Progressing Goal: Emotional status will improve Outcome: Progressing Goal: Verbalization of understanding the information provided will improve Outcome: Progressing   Problem: Activity: Goal: Interest or engagement in activities will improve Outcome: Progressing Goal: Sleeping patterns will improve Outcome: Progressing   Problem: Coping: Goal: Ability to verbalize frustrations and anger appropriately will improve Outcome: Progressing Goal: Ability to demonstrate self-control will improve Outcome: Progressing   Problem: Health Behavior/Discharge Planning: Goal: Identification of resources available to assist in meeting health care needs will improve Outcome: Progressing Goal: Compliance with treatment plan for underlying cause of condition will improve Outcome: Progressing   Problem: Physical Regulation: Goal: Ability to maintain clinical measurements within normal limits will improve Outcome: Progressing   Problem: Safety: Goal: Periods of time without injury will increase Outcome: Progressing

## 2020-09-14 NOTE — Progress Notes (Signed)
[facility] MD Progress Note  09/14/2020 12:39 PM Autumn [name]  MRN:  [id number] Subjective:   I 'm doing ok" Pt seen for follow up. Pt needed oprn zofran yesterday for nausea/vomiting. Pt reports feeling better. bodyache is better,  Pt less anxious, cooperative.   Her mood remains depressed , but reports feeling better than before,  Denies suicidal thoughts, intent, plan. She is tolerating her medications well.   lithium level- 0.79, depakote level,-89,  lipid panel-wnl,  and hemoglobin A1c -5.4.   She is hopeful to attend [place] for outpatient substance abuse treatment once stabilize on medications, and finished with acute detox.  Principal Problem: Bipolar I disorder, most recent episode depressed, severe without psychotic features ([place]) Diagnosis: Principal Problem:   Bipolar I disorder, most recent episode depressed, severe without psychotic features ([place]) Active Problems:   Tobacco use disorder   Amphetamine use disorder, severe (HCC)   Opioid use disorder, severe, dependence ([place])   Cannabis use disorder, severe, dependence ([place])  Total Time spent with patient: 45 minutes  Past Psychiatric History: Patient has a history of bipolar disorder and has presented with mixed psychosis and depression in the past. Has done well on mood stabilizers but is currently noncompliant. History of recurrent substance abuse problems especially opiates in the past. When compliant with medication and treatment has had some stability before.  Past Medical History:  Past Medical History:  Diagnosis Date  . Allergy    Seasonal, Ultram (seizures)  . Anemia 2005  . Bipolar 1 disorder ([place])   . Glaucoma   . Seizures ([place]) 2008    Past Surgical History:  Procedure Laterality Date  . BREAST BIOPSY Right    x 2  . CESAREAN SECTION     x 2  . LEEP  2000   Family History:  Family History  Problem Relation Age of Onset  . Hypertension Mother   . Hyperlipidemia Mother   . Asthma Mother   .  Parkinson's disease Father   . Lung cancer Maternal Grandmother   . Other Paternal Grandfather 74       Cardiac arrest  . Heart disease Paternal Grandfather   . Tourette syndrome Daughter    Family Psychiatric  History: None reported Social History:  Social History   Substance and Sexual Activity  Alcohol Use Yes  . Alcohol/week: 4.0 standard drinks  . Types: 4 Cans of beer per week   Comment: Ocassional     Social History   Substance and Sexual Activity  Drug Use Yes  . Types: Marijuana   Comment: percocet -last use over 1 week ago. Heroin-last used 2/11    Social History   Socioeconomic History  . Marital status: Legally Separated    Spouse name: Not on file  . Number of children: Not on file  . Years of education: Not on file  . Highest education level: Not on file  Occupational History  . Not on file  Tobacco Use  . Smoking status: Current Every Day Smoker    Packs/day: 1.00    Years: 9.00    Pack years: 9.00    Types: E-cigarettes  . Smokeless tobacco: Never Used  Vaping Use  . Vaping Use: Never used  Substance and Sexual Activity  . Alcohol use: Yes    Alcohol/week: 4.0 standard drinks    Types: 4 Cans of beer per week    Comment: Ocassional  . Drug use: Yes    Types: Marijuana    Comment: percocet -last  use over 1 week ago. Heroin-last used 2/11  . Sexual activity: Yes    Birth control/protection: None  Other Topics Concern  . Not on file  Social History Narrative  . Not on file   Social Determinants of Health   Financial Resource Strain:   . Difficulty of Paying Living Expenses: Not on file  Food Insecurity:   . Worried About Charity fundraiser in the Last Year: Not on file  . Ran Out of Food in the Last Year: Not on file  Transportation Needs:   . Lack of Transportation (Medical): Not on file  . Lack of Transportation (Non-Medical): Not on file  Physical Activity:   . Days of Exercise per Week: Not on file  . Minutes of Exercise per  Session: Not on file  Stress:   . Feeling of Stress : Not on file  Social Connections:   . Frequency of Communication with Friends and Family: Not on file  . Frequency of Social Gatherings with Friends and Family: Not on file  . Attends Religious Services: Not on file  . Active Member of Clubs or Organizations: Not on file  . Attends Archivist Meetings: Not on file  . Marital Status: Not on file   Additional Social History:      Sleep: Fair  Appetite:  Poor  Current Medications: Current Facility-Administered Medications  Medication Dose Route Frequency Provider Last Rate Last Admin  . acetaminophen (TYLENOL) tablet 650 mg  650 mg Oral Q6H PRN Clapacs, [name], MD   650 mg at 09/13/20 1441  . alum & mag hydroxide-simeth (MAALOX/MYLANTA) 200-200-20 MG/5ML suspension 30 mL  30 mL Oral Q4H PRN Clapacs, [name], MD      . ARIPiprazole (ABILIFY) tablet 20 mg  20 mg Oral Daily Clapacs, [name], MD   20 mg at 09/14/20 0816  . divalproex (DEPAKOTE) DR tablet 500 mg  500 mg Oral Q12H Clapacs, [name], MD   500 mg at 09/14/20 0816  . feeding supplement (ENSURE ENLIVE / ENSURE PLUS) liquid 237 mL  237 mL Oral BID BM Salley Scarlet, MD   237 mL at 09/13/20 1000  . lithium carbonate (ESKALITH) CR tablet 450 mg  450 mg Oral Q12H Clapacs, [name], MD   450 mg at 09/14/20 0816  . magnesium hydroxide (MILK OF MAGNESIA) suspension 30 mL  30 mL Oral Daily PRN Clapacs, [name], MD      . multivitamin with minerals tablet 1 tablet  1 tablet Oral Daily Salley Scarlet, MD   1 tablet at 09/14/20 0816  . nicotine (NICODERM CQ - dosed in mg/24 hours) patch 21 mg  21 mg Transdermal Daily Clapacs, [name], MD   21 mg at 09/14/20 0817  . ondansetron (ZOFRAN) tablet 8 mg  8 mg Oral Q8H PRN Salley Scarlet, MD   8 mg at 09/13/20 2045  . traZODone (DESYREL) tablet 200 mg  200 mg Oral QHS PRN Clapacs, [name], MD   200 mg at 09/13/20 2044    Lab Results:  Results for orders placed or performed during the  hospital encounter of 09/10/20 (from the past 48 hour(s))  Valproic acid level     Status: None   Collection Time: 09/13/20  6:42 AM  Result Value Ref Range   Valproic Acid Lvl 89 50.0 - 100.0 ug/mL    Comment: Performed at [facility], [address] [place], [place] [address]  Lithium level  Status: None   Collection Time: 09/13/20  6:42 AM  Result Value Ref Range   Lithium Lvl 0.79 0.60 - 1.20 mmol/L    Comment: Performed at [facility], [address] [place], [place] [address]  Lipid panel     Status: None   Collection Time: 09/13/20  6:42 AM  Result Value Ref Range   Cholesterol 166 0 - 200 mg/dL   Triglycerides 45 <150 mg/dL   HDL 60 >40 mg/dL   Total CHOL/HDL Ratio 2.8 RATIO   VLDL 9 0 - 40 mg/dL   LDL Cholesterol 97 0 - 99 mg/dL    Comment:        Total Cholesterol/HDL:CHD Risk Coronary Heart Disease Risk Table                     Men   Women  1/2 Average Risk   3.4   3.3  Average Risk       5.0   4.4  2 X Average Risk   9.6   7.1  3 X Average Risk  23.4   11.0        Use the calculated Patient Ratio above and the CHD Risk Table to determine the patient's CHD Risk.        ATP III CLASSIFICATION (LDL):  <100     mg/dL   Optimal  100-129  mg/dL   Near or Above                    Optimal  130-159  mg/dL   Borderline  160-189  mg/dL   High  >190     mg/dL   Very High Performed at [facility], [address] [place], [place] [address]   Hemoglobin A1c     Status: None   Collection Time: 09/13/20  6:42 AM  Result Value Ref Range   Hgb A1c MFr Bld 5.4 4.8 - 5.6 %    Comment: (NOTE) Pre diabetes:          5.7%-6.4%  Diabetes:              >6.4%  Glycemic control for   <7.0% adults with diabetes    Mean Plasma Glucose 108.28 mg/dL    Comment: Performed at [facility] [address], [place], [place] [address]    Blood Alcohol level:  Lab Results  Component Value Date   [facility] <10 09/09/2020   ETH <10 63/84/6659     Metabolic Disorder Labs: Lab Results  Component Value Date   HGBA1C 5.4 09/13/2020   MPG 108.28 09/13/2020   MPG 96.8 11/21/2018   No results found for: PROLACTIN Lab Results  Component Value Date   CHOL 166 09/13/2020   TRIG 45 09/13/2020   HDL 60 09/13/2020   CHOLHDL 2.8 09/13/2020   VLDL 9 09/13/2020   LDLCALC 97 09/13/2020   LDLCALC 124 (H) 11/21/2018    Physical Findings: AIMS:  , ,  ,  ,    CIWA:    COWS:     Musculoskeletal: Strength & Muscle Tone: within normal limits Gait & Station: normal Patient leans: N/A  Psychiatric Specialty Exam: Physical Exam Vitals and nursing note reviewed.  HENT:     Head: Normocephalic and atraumatic.     Right Ear: External ear normal.     Left Ear: External ear normal.     Nose: Nose normal.     Mouth/Throat:  Mouth: Mucous membranes are moist.  Cardiovascular:     Rate and Rhythm: Normal rate.  Pulmonary:     Effort: Pulmonary effort is normal.  Musculoskeletal:        General: No swelling. Normal range of motion.  Neurological:     General: No focal deficit present.     Mental Status: She is alert and oriented to person, place, and time.  Psychiatric:        Attention and Perception: Attention and perception normal.        Mood and Affect: Mood is depressed. Affect is tearful.        Speech: Speech normal.        Behavior: Behavior is cooperative.        Thought Content: Thought content includes suicidal ideation.        Cognition and Memory: Cognition and memory normal.        Judgment: Judgment normal.     Review of Systems  Constitutional: Positive for chills and fatigue.  HENT: Positive for rhinorrhea. Negative for sore throat.   Eyes: Negative for photophobia and visual disturbance.  Respiratory: Negative for cough and shortness of breath.   Cardiovascular: Negative for chest pain and palpitations.  Gastrointestinal: Positive for nausea and vomiting. Negative for constipation and diarrhea.   Endocrine: Positive for cold intolerance. Negative for heat intolerance.  Genitourinary: Negative for difficulty urinating and dysuria.  Musculoskeletal: Positive for arthralgias and myalgias.  Skin: Negative for rash and wound.  Allergic/Immunologic: Negative for food allergies and immunocompromised state.  Neurological: Negative for dizziness and headaches.  Hematological: Negative for adenopathy. Does not bruise/bleed easily.  Psychiatric/Behavioral: Positive for dysphoric mood and suicidal ideas.    Blood pressure 104/66, pulse 66, temperature 97.6 F (36.4 C), temperature source Oral, resp. rate 18, height 5\' 1"  (1.549 m), weight 49.9 kg, last menstrual period 09/01/2020, SpO2 99 %.Body mass index is 20.78 kg/m.  General Appearance: Casual and Fairly Groomed  Eye Contact:  Good  Speech:  Clear and Coherent  Volume:  Normal  Mood:  Depressed and Dysphoric  Affect:  Congruent and Depressed  Thought Process:  Coherent  Orientation:  Full (Time, Place, and Person)  Thought Content:  Logical  Suicidal Thoughts:  denies  Homicidal Thoughts:  No  Memory:  Immediate;   Fair Recent;   Fair Remote;   Fair  Judgement:  Fair  Insight:  Fair  Psychomotor Activity:  Normal  Concentration:  Concentration: Fair and Attention Span: Fair  Recall:  [employer] of Knowledge:  Fair  Language:  Fair  Akathisia:  Negative  Handed:  Right  AIMS (if indicated):     Assets:  Communication Skills Desire for Improvement Housing Intimacy Resilience Social Support  ADL's:  Intact  Cognition:  WNL  Sleep:  Number of Hours: 7.5     Treatment Plan Summary: Daily contact with patient to assess and evaluate symptoms and progress in treatment and Medication management PLAN OF CARE:Continue inpatient admission.  Cont Abilify 20 mg daily for mood , Depakote 500 mg BID, Lithium CR 450 mg BID for bipolar I disorder.  lithium level- 0.79, depakote level,-89,  lipid panel-wnl,  and hemoglobin A1c  -5.4   Patient has completed 3 day taper of Suboxone for heroin withdrawals. Zofran 8 mg Q8hr PRN for nausea and vomiting in place.   [name], MD 09/14/2020, 12:39 PMPatient ID: Autumn [name], female   DOB: September 23, 1976, 44 y.o.   MRN: [id number] Patient ID: Autumn [name],  female   DOB: 01/21/1976, 44 y.o.   MRN: [id number]

## 2020-09-14 NOTE — Progress Notes (Signed)
Patient has been sad and isolative but denies SI, HI and AVH. Asking for prn sleep medication and something for nausea and given prn trazodone and zofran per order. No further complaints

## 2020-09-14 NOTE — [facility] Group Notes (Signed)
[facility] Notes: (Clinical Social Work)   09/14/2020      Type of Therapy:  Group Therapy   Participation Level:  Did Not Attend - was invited individually by Nurse/MHT and chose not to attend.   Raina Mina, Okay 09/14/2020  2:52 PM

## 2020-09-14 NOTE — Progress Notes (Signed)
Patient pleasant and cooperative. Denies SI HI and AVH. No complaints of nausea or pain

## 2020-09-14 NOTE — Plan of Care (Signed)
  Problem: Education: Goal: Knowledge of [facility] Education information/materials will improve Outcome: Progressing Goal: Emotional status will improve Outcome: Progressing Goal: Verbalization of understanding the information provided will improve Outcome: Progressing   Problem: Activity: Goal: Interest or engagement in activities will improve Outcome: Progressing Goal: Sleeping patterns will improve Outcome: Progressing   Problem: Coping: Goal: Ability to verbalize frustrations and anger appropriately will improve Outcome: Progressing Goal: Ability to demonstrate self-control will improve Outcome: Progressing   Problem: Health Behavior/Discharge Planning: Goal: Identification of resources available to assist in meeting health care needs will improve Outcome: Progressing Goal: Compliance with treatment plan for underlying cause of condition will improve Outcome: Progressing   Problem: Physical Regulation: Goal: Ability to maintain clinical measurements within normal limits will improve Outcome: Progressing   Problem: Safety: Goal: Periods of time without injury will increase Outcome: Progressing

## 2020-09-14 NOTE — Plan of Care (Signed)
D- Patient alert and oriented. Patient presents in a pleasant mood on assessment stating that she slept "pretty good" last night and had no complaints to voice to this Probation officer. Patient denies SI, HI, AVH, and pain at this time. Patient also denies any signs/symptoms of depression/anxiety, reporting that overall, she is feeling "pretty well". Patient's goal for today is to "go to groups", in which she will "get up", in order to achieve her goal.   A- Scheduled medications administered to patient, per MD orders. Support and encouragement provided.  Routine safety checks conducted every 15 minutes.  Patient informed to notify staff with problems or concerns.  R- No adverse drug reactions noted. Patient contracts for safety at this time. Patient compliant with medications and treatment plan. Patient receptive, calm, and cooperative. Patient interacts well with others on the unit.  Patient remains safe at this time.  Problem: Education: Goal: Knowledge of [facility] Education information/materials will improve Outcome: Progressing Goal: Emotional status will improve Outcome: Progressing Goal: Verbalization of understanding the information provided will improve Outcome: Progressing   Problem: Activity: Goal: Interest or engagement in activities will improve Outcome: Progressing Goal: Sleeping patterns will improve Outcome: Progressing   Problem: Coping: Goal: Ability to verbalize frustrations and anger appropriately will improve Outcome: Progressing Goal: Ability to demonstrate self-control will improve Outcome: Progressing   Problem: Health Behavior/Discharge Planning: Goal: Identification of resources available to assist in meeting health care needs will improve Outcome: Progressing Goal: Compliance with treatment plan for underlying cause of condition will improve Outcome: Progressing   Problem: Physical Regulation: Goal: Ability to maintain clinical measurements within normal  limits will improve Outcome: Progressing   Problem: Safety: Goal: Periods of time without injury will increase Outcome: Progressing

## 2020-09-15 ENCOUNTER — Other Ambulatory Visit: Payer: Self-pay | Admitting: Behavioral Health

## 2020-09-15 DIAGNOSIS — F314 Bipolar disorder, current episode depressed, severe, without psychotic features: Secondary | ICD-10-CM | POA: Diagnosis not present

## 2020-09-15 MED ORDER — LITHIUM CARBONATE ER 450 MG PO TBCR: 450.0000 mg | EXTENDED_RELEASE_TABLET | Freq: Two times a day (BID) | ORAL | 1 refills | Status: DC

## 2020-09-15 MED ORDER — LITHIUM CARBONATE ER 450 MG PO TBCR
450.0000 mg | EXTENDED_RELEASE_TABLET | Freq: Two times a day (BID) | ORAL | 0 refills | Status: DC
Start: 2020-09-15 — End: 2020-09-15

## 2020-09-15 MED ORDER — TRAZODONE HCL 100 MG PO TABS
200.0000 mg | ORAL_TABLET | Freq: Every evening | ORAL | 1 refills | Status: DC | PRN
Start: 2020-09-15 — End: 2020-12-18

## 2020-09-15 MED ORDER — ARIPIPRAZOLE 20 MG PO TABS: 20.0000 mg | ORAL_TABLET | Freq: Every day | ORAL | 1 refills | Status: DC

## 2020-09-15 MED ORDER — DIVALPROEX SODIUM 500 MG PO DR TAB: 500.0000 mg | DELAYED_RELEASE_TABLET | Freq: Two times a day (BID) | ORAL | 0 refills | Status: DC

## 2020-09-15 MED ORDER — ARIPIPRAZOLE 20 MG PO TABS: 20.0000 mg | ORAL_TABLET | Freq: Every day | ORAL | 0 refills | Status: DC

## 2020-09-15 MED ORDER — TRAZODONE HCL 100 MG PO TABS: 200.0000 mg | ORAL_TABLET | Freq: Every evening | ORAL | 0 refills | Status: DC | PRN

## 2020-09-15 MED ORDER — DIVALPROEX SODIUM 500 MG PO DR TAB: 500.0000 mg | DELAYED_RELEASE_TABLET | Freq: Two times a day (BID) | ORAL | 1 refills | Status: DC

## 2020-09-15 MED ORDER — NICOTINE 21 MG/24HR TD PT24: 21.0000 mg | MEDICATED_PATCH | Freq: Every day | TRANSDERMAL | 0 refills | Status: DC

## 2020-09-15 NOTE — Progress Notes (Signed)
[facility] MD Progress Note  09/15/2020 1:37 PM Roshanna Cimino  MRN:  [id number] Subjective:   "I'm doing much better today" Patient seen one-on-one at bedside today. She notes that she feels much better today. She no longer feels she is in active withdrawals. Specifically denies nausea, vomiting, diarrhea, rhinorrhea, headaches or tremors. She feels her mood has improved where she is no longer depressed, and also not feeling manic. She denies suicidal ideations, homicidal ideations, visual hallucinations, and auditory hallucinations.  She is tolerating her medications well. Lithium level- 0.79, depakote level,-89,  lipid panel-wnl,  and hemoglobin A1c -5.4.  She is hopeful to attend [facility] for outpatient substance abuse treatment at discharge.  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 Has done well on mood stabilizers but is currently noncompliant. History of recurrent substance abuse problems especially opiates in the past. When compliant with medication and treatment has had some stability before.  Past Medical History:  Past Medical History:  Diagnosis Date  . Allergy    Seasonal, Ultram (seizures)  . Anemia 2005  . Bipolar 1 disorder ([place])   . Glaucoma   . Seizures ([place]) 2008    Past Surgical History:  Procedure Laterality Date  . BREAST BIOPSY Right    x 2  . CESAREAN SECTION     x 2  . LEEP  2000   Family History:  Family History   Problem Relation Age of Onset  . Hypertension Mother   . Hyperlipidemia Mother   . Asthma Mother   . Parkinson's disease Father   . Lung cancer Maternal Grandmother   . Other Paternal Grandfather 8       Cardiac arrest  . Heart disease Paternal Grandfather   . Tourette syndrome Daughter    Family Psychiatric  History: None reported Social History:  Social History   Substance and Sexual Activity  Alcohol Use Yes  . Alcohol/week: 4.0 standard drinks  . Types: 4 Cans of beer per week   Comment: Ocassional     Social History   Substance and Sexual Activity  Drug Use Yes  . Types: Marijuana   Comment: percocet -last use over 1 week ago. Heroin-last used 2/11    Social History   Socioeconomic History  . Marital status: Legally Separated    Spouse name: Not on file  . Number of children: Not on file  . Years of education: Not on file  . Highest education level: Not on file  Occupational History  . Not on file  Tobacco Use  . Smoking status: Current Every Day Smoker    Packs/day: 1.00    Years: 9.00    Pack years: 9.00    Types: E-cigarettes  . Smokeless tobacco: Never Used  Vaping Use  . Vaping Use: Never used  Substance and Sexual Activity  . Alcohol use: Yes    Alcohol/week: 4.0 standard drinks    Types: 4 Cans of beer per week    Comment: Ocassional  . Drug use: Yes  Types: Marijuana    Comment: percocet -last use over 1 week ago. Heroin-last used 2/11  . Sexual activity: Yes    Birth control/protection: None  Other Topics Concern  . Not on file  Social History Narrative  . Not on file   Social Determinants of Health   Financial Resource Strain:   . Difficulty of Paying Living Expenses: Not on file  Food Insecurity:   . Worried About Charity fundraiser in the Last Year: Not on file  . Ran Out of Food in the Last Year: Not on file  Transportation Needs:   . Lack of Transportation (Medical): Not on file  . Lack of Transportation (Non-Medical):  Not on file  Physical Activity:   . Days of Exercise per Week: Not on file  . Minutes of Exercise per Session: Not on file  Stress:   . Feeling of Stress : Not on file  Social Connections:   . Frequency of Communication with Friends and Family: Not on file  . Frequency of Social Gatherings with Friends and Family: Not on file  . Attends Religious Services: Not on file  . Active Member of Clubs or Organizations: Not on file  . Attends Archivist Meetings: Not on file  . Marital Status: Not on file   Additional Social History:      Sleep: Fair  Appetite:  Poor  Current Medications: Current Facility-Administered Medications  Medication Dose Route Frequency Provider Last Rate Last Admin  . acetaminophen (TYLENOL) tablet 650 mg  650 mg Oral Q6H PRN Clapacs, [name], MD   650 mg at 09/13/20 1441  . alum & mag hydroxide-simeth (MAALOX/MYLANTA) 200-200-20 MG/5ML suspension 30 mL  30 mL Oral Q4H PRN Clapacs, [name], MD      . ARIPiprazole (ABILIFY) tablet 20 mg  20 mg Oral Daily Clapacs, [name], MD   20 mg at 09/15/20 0814  . divalproex (DEPAKOTE) DR tablet 500 mg  500 mg Oral Q12H Clapacs, [name], MD   500 mg at 09/15/20 0814  . feeding supplement (ENSURE ENLIVE / ENSURE PLUS) liquid 237 mL  237 mL Oral BID BM Salley Scarlet, MD   237 mL at 09/15/20 0930  . lithium carbonate (ESKALITH) CR tablet 450 mg  450 mg Oral Q12H Clapacs, [name], MD   450 mg at 09/15/20 0814  . magnesium hydroxide (MILK OF MAGNESIA) suspension 30 mL  30 mL Oral Daily PRN Clapacs, [name], MD      . multivitamin with minerals tablet 1 tablet  1 tablet Oral Daily Salley Scarlet, MD   1 tablet at 09/15/20 0814  . nicotine (NICODERM CQ - dosed in mg/24 hours) patch 21 mg  21 mg Transdermal Daily Clapacs, [name], MD   21 mg at 09/15/20 0815  . ondansetron (ZOFRAN) tablet 8 mg  8 mg Oral Q8H PRN Salley Scarlet, MD   8 mg at 09/13/20 2045  . traZODone (DESYREL) tablet 200 mg  200 mg Oral QHS PRN Clapacs, [name], MD   200 mg at 09/14/20 2106    Lab Results:  No results found for this or any previous visit (from the past 48 hour(s)).  Blood Alcohol level:  Lab Results  Component Value Date   [facility] <10 09/09/2020   ETH <10 16/05/3709    Metabolic Disorder Labs: Lab Results  Component Value Date   HGBA1C 5.4 09/13/2020   MPG 108.28 09/13/2020   MPG 96.8 11/21/2018  No results found for: PROLACTIN Lab Results  Component Value Date   CHOL 166 09/13/2020   TRIG 45 09/13/2020   HDL 60 09/13/2020   CHOLHDL 2.8 09/13/2020   VLDL 9 09/13/2020   LDLCALC 97 09/13/2020   LDLCALC 124 (H) 11/21/2018    Physical Findings: AIMS: Facial and Oral Movements Muscles of Facial Expression: None, normal Lips and Perioral Area: None, normal Jaw: None, normal Tongue: None, normal,Extremity Movements Upper (arms, wrists, hands, fingers): None, normal Lower (legs, knees, ankles, toes): None, normal, Trunk Movements Neck, shoulders, hips: None, normal, Overall Severity Severity of abnormal movements (highest score from questions above): None, normal Incapacitation due to abnormal movements: None, normal Patient's awareness of abnormal movements (rate only patient's report): No Awareness, Dental Status Current problems with teeth and/or dentures?: No Does patient usually wear dentures?: No  CIWA:    COWS:     Musculoskeletal: Strength & Muscle Tone: within normal limits Gait & Station: normal Patient leans: N/A  Psychiatric Specialty Exam: Physical Exam Vitals and nursing note reviewed.  HENT:     Head: Normocephalic and atraumatic.     Right Ear: External ear normal.     Left Ear: External ear normal.     Nose: Nose normal.     Mouth/Throat:     Mouth: Mucous membranes are moist.  Cardiovascular:     Rate and Rhythm: Normal rate.  Pulmonary:     Effort: Pulmonary effort is normal.  Musculoskeletal:        General: No swelling. Normal range of motion.  Neurological:     General: No  focal deficit present.     Mental Status: She is alert and oriented to person, place, and time.  Psychiatric:        Attention and Perception: Attention and perception normal.        Mood and Affect: Mood and affect normal. Mood is not depressed. Affect is not tearful.        Speech: Speech normal.        Behavior: Behavior is cooperative.        Thought Content: Thought content does not include suicidal ideation.        Cognition and Memory: Cognition and memory normal.        Judgment: Judgment normal.     Review of Systems  Constitutional: Positive for fatigue. Negative for chills.  HENT: Negative for rhinorrhea and sore throat.   Eyes: Negative for photophobia and visual disturbance.  Respiratory: Negative for cough and shortness of breath.   Cardiovascular: Negative for chest pain and palpitations.  Gastrointestinal: Negative for constipation, diarrhea, nausea and vomiting.  Endocrine: Negative for cold intolerance and heat intolerance.  Genitourinary: Negative for difficulty urinating and dysuria.  Musculoskeletal: Negative for arthralgias and myalgias.  Skin: Negative for rash and wound.  Allergic/Immunologic: Negative for food allergies and immunocompromised state.  Neurological: Negative for dizziness and headaches.  Hematological: Negative for adenopathy. Does not bruise/bleed easily.  Psychiatric/Behavioral: Negative for dysphoric mood, hallucinations and suicidal ideas.    Blood pressure 105/68, pulse 63, temperature 97.9 F (36.6 C), temperature source Oral, resp. rate 18, height 5\' 1"  (1.549 m), weight 49.9 kg, last menstrual period 09/01/2020, SpO2 99 %.Body mass index is 20.78 kg/m.  General Appearance: Casual and Fairly Groomed  Eye Contact:  Good  Speech:  Clear and Coherent  Volume:  Normal  Mood:  Euthymic  Affect:  Congruent and brighter  Thought Process:  Coherent  Orientation:  Full (Time, Place,  and Person)  Thought Content:  Logical  Suicidal Thoughts:   denies  Homicidal Thoughts:  No  Memory:  Immediate;   Fair Recent;   Fair Remote;   Fair  Judgement:  Fair  Insight:  Fair  Psychomotor Activity:  Normal  Concentration:  Concentration: Fair and Attention Span: Fair  Recall:  [employer] of Knowledge:  Fair  Language:  Fair  Akathisia:  Negative  Handed:  Right  AIMS (if indicated):     Assets:  Communication Skills Desire for Improvement Housing Intimacy Resilience Social Support  ADL's:  Intact  Cognition:  WNL  Sleep:  Number of Hours: 8.5     Treatment Plan Summary: Daily contact with patient to assess and evaluate symptoms and progress in treatment and Medication management PLAN OF CARE:Continue inpatient admission. Cont Abilify 20 mg daily for mood , Depakote 500 mg BID, Lithium CR 450 mg BID for bipolar I disorder. Lithium level- 0.79, depakote level,-89,  lipid panel-wnl,  and hemoglobin A1c -5.4  Anticipate discharge tomorrow based on clinical improvement today.    Salley Scarlet, MD 09/15/2020, 1:37 PMPatient ID: [name], female   DOB: November 11, 1975, 44 y.o.   MRN: [id number] Patient ID: Tiasia Weberg, female   DOB: 1975/12/31, 44 y.o.   MRN: [id number]

## 2020-09-15 NOTE — Plan of Care (Signed)
D- Patient alert and oriented. Patient presents in a pleasant mood on assessment stating that she slept ok last night and had no complaints to voice to this Probation officer. Patient denies SI, HI, AVH, and pain at this time. Patient also denies any signs/symptoms of depression and anxiety, reporting "I'm ready to go home". Patient's goal, again, for today is to "go to groups", in which she will "get up", in order to achieve her goal, but she did not attend any unit groups today.  A- Scheduled medications administered to patient, per MD orders. Support and encouragement provided.  Routine safety checks conducted every 15 minutes.  Patient informed to notify staff with problems or concerns.  R- No adverse drug reactions noted. Patient contracts for safety at this time. Patient compliant with medications and treatment plan. Patient receptive, calm, and cooperative. Patient interacts well with others on the unit.  Patient remains safe at this time.  Problem: Education: Goal: Knowledge of [facility] Education information/materials will improve Outcome: Progressing Goal: Emotional status will improve Outcome: Progressing Goal: Verbalization of understanding the information provided will improve Outcome: Progressing   Problem: Activity: Goal: Interest or engagement in activities will improve Outcome: Progressing Goal: Sleeping patterns will improve Outcome: Progressing   Problem: Coping: Goal: Ability to verbalize frustrations and anger appropriately will improve Outcome: Progressing Goal: Ability to demonstrate self-control will improve Outcome: Progressing   Problem: Health Behavior/Discharge Planning: Goal: Identification of resources available to assist in meeting health care needs will improve Outcome: Progressing Goal: Compliance with treatment plan for underlying cause of condition will improve Outcome: Progressing   Problem: Physical Regulation: Goal: Ability to maintain clinical  measurements within normal limits will improve Outcome: Progressing   Problem: Safety: Goal: Periods of time without injury will increase Outcome: Progressing

## 2020-09-15 NOTE — Discharge Summary (Signed)
Physician Discharge Summary Note  Patient:  Autumn [name] is an 44 y.o., female MRN:  [id number] DOB:  31-Aug-1976 Patient phone:  [phone] (home)  Patient address:   [place] [address],  Total Time spent with patient: 30 minutes  Date of Admission:  09/10/2020 Date of Discharge: 09/16/2020  Reason for Admission:  Worsening depression and suicidal ideation in the context of medication noncompliance and relpase on cocaine, cannabis, and heroin  Principal Problem: Bipolar I disorder, most recent episode depressed, severe without psychotic features [facility]) Discharge Diagnoses: Principal Problem:   Bipolar I disorder, most recent episode depressed, severe without psychotic features ([place]) Active Problems:   Tobacco use disorder   Amphetamine use disorder, severe (HCC)   Opioid use disorder, severe, dependence ([place])   Cannabis use disorder, severe, dependence ([place])   Past Psychiatric History: Patient has a history of bipolar disorder and has presented with mixed psychosis and depression in the past. Has done well on mood stabilizers but is currently noncompliant. History of recurrent substance abuse problems especially opiates in the past. When compliant with medication and treatment has had some stability before.  Past Medical History:  Past Medical History:  Diagnosis Date  . Allergy    Seasonal, Ultram (seizures)  . Anemia 2005  . Bipolar 1 disorder ([place])   . Glaucoma   . Seizures ([place]) 2008    Past Surgical History:  Procedure Laterality Date  . BREAST BIOPSY Right    x 2  . CESAREAN SECTION     x 2  . LEEP  2000   Family History:  Family History  Problem Relation Age of Onset  . Hypertension Mother   . Hyperlipidemia Mother   . Asthma Mother   . Parkinson's disease Father   . Lung cancer Maternal Grandmother   . Other Paternal Grandfather 68       Cardiac arrest  . Heart disease Paternal Grandfather   . Tourette syndrome Daughter    Family  Psychiatric  History: None reported Social History:  Social History   Substance and Sexual Activity  Alcohol Use Yes  . Alcohol/week: 4.0 standard drinks  . Types: 4 Cans of beer per week   Comment: Ocassional     Social History   Substance and Sexual Activity  Drug Use Yes  . Types: Marijuana   Comment: percocet -last use over 1 week ago. Heroin-last used 2/11    Social History   Socioeconomic History  . Marital status: Legally Separated    Spouse name: Not on file  . Number of children: Not on file  . Years of education: Not on file  . Highest education level: Not on file  Occupational History  . Not on file  Tobacco Use  . Smoking status: Current Every Day Smoker    Packs/day: 1.00    Years: 9.00    Pack years: 9.00    Types: E-cigarettes  . Smokeless tobacco: Never Used  Vaping Use  . Vaping Use: Never used  Substance and Sexual Activity  . Alcohol use: Yes    Alcohol/week: 4.0 standard drinks    Types: 4 Cans of beer per week    Comment: Ocassional  . Drug use: Yes    Types: Marijuana    Comment: percocet -last use over 1 week ago. Heroin-last used 2/11  . Sexual activity: Yes    Birth control/protection: None  Other Topics Concern  . Not on file  Social History Narrative  . Not  on file   Social Determinants of Health   Financial Resource Strain:   . Difficulty of Paying Living Expenses: Not on file  Food Insecurity:   . Worried About Charity fundraiser in the Last Year: Not on file  . Ran Out of Food in the Last Year: Not on file  Transportation Needs:   . Lack of Transportation (Medical): Not on file  . Lack of Transportation (Non-Medical): Not on file  Physical Activity:   . Days of Exercise per Week: Not on file  . Minutes of Exercise per Session: Not on file  Stress:   . Feeling of Stress : Not on file  Social Connections:   . Frequency of Communication with Friends and Family: Not on file  . Frequency of Social Gatherings with Friends  and Family: Not on file  . Attends Religious Services: Not on file  . Active Member of Clubs or Organizations: Not on file  . Attends Archivist Meetings: Not on file  . Marital Status: Not on file    Hospital Course:  Patient admitted for worsening depression and suicidal ideation in context of medication noncompliance and relapse on cocaine, heroin, and cannabis. She was restarted on her home medications of Abilify 20 mg, Lithium CR 450 mg BID, and Depakote 500 mg BID, and trazodone 200 mg QHS PRN for sleep on admission. Her lithium level on Nov 6th was 0.79, and valproic acid level was 89. She tolerated these medications well without side effect, and mood gradually improved. She also completed a 3-day worth of Suboxone for acute heroin withdrawals. She was also treated symptomatically with zofran for nausea and vomiting. Her withdrawal symptoms resolved by Saturday, Nov 6th. During her stay she gradually attended more groups and interacted with peers as withdrawal symptoms improved. By time of discharge she had euthymic mood, and was future-oriented on getting back to work, and attending outpatient substance abuse groups. She denied suicidal ideations, homicidal ideations, visual hallucinations, and auditory hallucinations. At time of discharge patient and treatment team felt she was safe to discharge with close outpatient follow-up.   Physical Findings: AIMS: Facial and Oral Movements Muscles of Facial Expression: None, normal Lips and Perioral Area: None, normal Jaw: None, normal Tongue: None, normal,Extremity Movements Upper (arms, wrists, hands, fingers): None, normal Lower (legs, knees, ankles, toes): None, normal, Trunk Movements Neck, shoulders, hips: None, normal, Overall Severity Severity of abnormal movements (highest score from questions above): None, normal Incapacitation due to abnormal movements: None, normal Patient's awareness of abnormal movements (rate only  patient's report): No Awareness, Dental Status Current problems with teeth and/or dentures?: No Does patient usually wear dentures?: No  CIWA:    COWS:     Musculoskeletal: Strength & Muscle Tone: within normal limits Gait & Station: normal Patient leans: N/A  Psychiatric Specialty Exam: Physical Exam Vitals and nursing note reviewed.  Constitutional:      Appearance: Normal appearance.  HENT:     Head: Normocephalic and atraumatic.     Right Ear: External ear normal.     Left Ear: External ear normal.     Nose: Nose normal.     Mouth/Throat:     Mouth: Mucous membranes are moist.     Pharynx: Oropharynx is clear.  Eyes:     Extraocular Movements: Extraocular movements intact.     Conjunctiva/sclera: Conjunctivae normal.     Pupils: Pupils are equal, round, and reactive to light.  Cardiovascular:     Rate and Rhythm:  Normal rate.     Pulses: Normal pulses.  Pulmonary:     Effort: Pulmonary effort is normal.     Breath sounds: Normal breath sounds.  Abdominal:     General: Abdomen is flat.     Palpations: Abdomen is soft.  Musculoskeletal:        General: No swelling. Normal range of motion.     Cervical back: Normal range of motion and neck supple.  Skin:    General: Skin is warm and dry.  Neurological:     General: No focal deficit present.     Mental Status: She is alert and oriented to person, place, and time.  Psychiatric:        Mood and Affect: Mood normal.        Behavior: Behavior normal.        Thought Content: Thought content normal.        Judgment: Judgment normal.     Review of Systems  Constitutional: Negative for activity change and fatigue.  HENT: Negative for rhinorrhea and sore throat.   Eyes: Negative for photophobia and visual disturbance.  Respiratory: Negative for cough and shortness of breath.   Cardiovascular: Negative for chest pain and palpitations.  Gastrointestinal: Negative for constipation, diarrhea, nausea and vomiting.   Endocrine: Negative for cold intolerance and heat intolerance.  Genitourinary: Negative for difficulty urinating and dysuria.  Musculoskeletal: Negative for arthralgias and myalgias.  Skin: Negative for rash and wound.  Allergic/Immunologic: Negative for food allergies and immunocompromised state.  Neurological: Negative for dizziness and headaches.  Hematological: Negative for adenopathy. Does not bruise/bleed easily.  Psychiatric/Behavioral: Negative for dysphoric mood, hallucinations, sleep disturbance and suicidal ideas. The patient is not nervous/anxious.   Blood pressure 102/67, pulse 66, temperature 97.8 F (36.6 C), temperature source Oral, resp. rate 16, height 5\' 1"  (1.549 m), weight 49.9 kg, last menstrual period 09/01/2020, SpO2 100 %.Body mass index is 20.78 kg/m.  General Appearance: Well Groomed  Engineer, water::  Good  Speech:  Clear and Coherent  Volume:  Normal  Mood:  Euthymic  Affect:  Congruent  Thought Process:  Coherent  Orientation:  Full (Time, Place, and Person)  Thought Content:  Logical  Suicidal Thoughts:  No  Homicidal Thoughts:  No  Memory:  Immediate;   Fair Recent;   Fair Remote;   Fair  Judgement:  Fair  Insight:  Fair  Psychomotor Activity:  Normal  Concentration:  Good  Recall:  Good  Fund of Knowledge:Good  Language: Good  Akathisia:  Negative  Handed:  Right  AIMS (if indicated):     Assets:  Communication Skills Desire for Improvement Housing Intimacy Leisure Time Physical Health Resilience Talents/Skills Transportation Vocational/Educational  Sleep:  Number of Hours: 8.5  Cognition: WNL  ADL's:  Intact        Have you used any form of tobacco in the last 30 days? (Cigarettes, Smokeless Tobacco, Cigars, and/or Pipes): Yes  Has this patient used any form of tobacco in the last 30 days? (Cigarettes, Smokeless Tobacco, Cigars, and/or Pipes) Yes, prescription for nicotine 21 mg patches provided  Blood Alcohol level:  Lab  Results  Component Value Date   ETH <10 09/09/2020   ETH <10 81/85/6314    Metabolic Disorder Labs:  Lab Results  Component Value Date   HGBA1C 5.4 09/13/2020   MPG 108.28 09/13/2020   MPG 96.8 11/21/2018   No results found for: PROLACTIN Lab Results  Component Value Date   CHOL 166 09/13/2020  TRIG 45 09/13/2020   HDL 60 09/13/2020   CHOLHDL 2.8 09/13/2020   VLDL 9 09/13/2020   LDLCALC 97 09/13/2020   LDLCALC 124 (H) 11/21/2018    See Psychiatric Specialty Exam and Suicide Risk Assessment completed by Attending Physician prior to discharge.  Discharge destination:  Home  Is patient on multiple antipsychotic therapies at discharge:  No   Has Patient had three or more failed trials of antipsychotic monotherapy by history:  No  Recommended Plan for Multiple Antipsychotic Therapies: NA  Discharge Instructions    Diet general   Complete by: As directed    Increase activity slowly   Complete by: As directed      Allergies as of 09/16/2020      Reactions   Haldol [haloperidol Lactate]    Patient states that she gets stiff muscles when taking Haldol   Ultram [tramadol] Other (See Comments)   Seizures   Ziprasidone Hcl    Other reaction(s): Other (See Comments) PER PT WHEN SHE TAKES THIS SHE CANT MOVE HER NECK      Medication List    TAKE these medications     Indication  ARIPiprazole 20 MG tablet Commonly known as: ABILIFY Take 1 tablet (20 mg total) by mouth daily.  Indication: MIXED BIPOLAR AFFECTIVE DISORDER   divalproex 500 MG DR tablet Commonly known as: DEPAKOTE Take 1 tablet (500 mg total) by mouth every 12 (twelve) hours.  Indication: Manic-Depression   lithium carbonate 450 MG CR tablet Commonly known as: ESKALITH Take 1 tablet (450 mg total) by mouth every 12 (twelve) hours.  Indication: Manic-Depression   medroxyPROGESTERone 150 MG/ML injection Commonly known as: DEPO-PROVERA Inject 1 mL (150 mg total) into the muscle every 3 (three)  months.  Indication: Birth Control Treatment   nicotine 21 mg/24hr patch Commonly known as: NICODERM CQ - dosed in mg/24 hours Place 1 patch (21 mg total) onto the skin daily.  Indication: Nicotine Addiction   ondansetron 4 MG disintegrating tablet Commonly known as: Zofran ODT Take 1 tablet (4 mg total) by mouth every 8 (eight) hours as needed for nausea or vomiting.  Indication: Nausea and Vomiting   traZODone 100 MG tablet Commonly known as: DESYREL Take 2 tablets (200 mg total) by mouth at bedtime as needed for sleep.  Indication: Trouble Sleeping       Follow-up Information    [facility] Follow up.   Why: Walk in appointments are first come first serve Monday through Friday from 9AM to [place]. Thanks! Contact information: [address] [place] [address] [phone]               Follow-up recommendations:  Activity:  as tolerated Diet:  regular diet  Comments:  7-day worth of free medications provided at discharge. Printed prescription for 30-day supply and one refill provided along with a work note.   Signed: Salley Scarlet, MD 09/16/2020, 9:11 AM

## 2020-09-15 NOTE — BHH Counselor (Signed)
CSW met with pt regarding discharge plans. She states that she was connected with [facility] in the past. Pt shares that she has also called them to get information but she would never follow up. CSW informs her that it will probably be a walk-in appointment through [facility]. She agreed. Pt and CSW discuss other discharge plans. She endorses plans to return home and states that she has transportation. Pt denies any need for clothes. CSW will arrange aftercare. No other concerns expressed. Contact ends without incident.   Chalmers Guest. Guerry Bruin, MSW, LCSW, [place] 09/15/2020 2:26 PM

## 2020-09-15 NOTE — Plan of Care (Signed)
Patient denies SI/HI/AVH, presents with pleasant affect. Patient stated she is ready to leave tomorrow  Problem: Education: Goal: Emotional status will improve Outcome: Progressing Goal: Verbalization of understanding the information provided will improve Outcome: Progressing

## 2020-09-15 NOTE — BHH Group Notes (Signed)
LCSW Group Therapy Note   09/15/2020 2:10 PM  Type of Therapy and Topic:  Group Therapy:  Overcoming Obstacles   Participation Level:  Did Not Attend   Description of Group:    In this group patients will be encouraged to explore what they see as obstacles to their own wellness and recovery. They will be guided to discuss their thoughts, feelings, and behaviors related to these obstacles. The group will process together ways to cope with barriers, with attention given to specific choices patients can make. Each patient will be challenged to identify changes they are motivated to make in order to overcome their obstacles. This group will be process-oriented, with patients participating in exploration of their own experiences as well as giving and receiving support and challenge from other group members.   Therapeutic Goals: 1. Patient will identify personal and current obstacles as they relate to admission. 2. Patient will identify barriers that currently interfere with their wellness or overcoming obstacles.  3. Patient will identify feelings, thought process and behaviors related to these barriers. 4. Patient will identify two changes they are willing to make to overcome these obstacles:      Summary of Patient Progress X    Therapeutic Modalities:   Cognitive Behavioral Therapy Solution Focused Therapy Motivational Interviewing Relapse Prevention Therapy  Hedy Camara R. Guerry Bruin, MSW, [place], [place] 09/15/2020 2:10 PM

## 2020-09-15 NOTE — BHH Suicide Risk Assessment (Signed)
[facility] Discharge Suicide Risk Assessment   Principal Problem: Bipolar I disorder, most recent episode depressed, severe without psychotic features [facility]) Discharge Diagnoses: Principal Problem:   Bipolar I disorder, most recent episode depressed, severe without psychotic features ([place]) Active Problems:   Tobacco use disorder   Amphetamine use disorder, severe (HCC)   Opioid use disorder, severe, dependence ([place])   Cannabis use disorder, severe, dependence ([place])   Total Time spent with patient: 30 minutes  Musculoskeletal: Strength & Muscle Tone: within normal limits Gait & Station: normal Patient leans: N/A  Psychiatric Specialty Exam: Review of Systems  Constitutional: Negative for activity change and fatigue.  HENT: Negative for rhinorrhea and sore throat.   Eyes: Negative for photophobia and visual disturbance.  Respiratory: Negative for cough and shortness of breath.   Cardiovascular: Negative for chest pain and palpitations.  Gastrointestinal: Negative for constipation, diarrhea, nausea and vomiting.  Endocrine: Negative for cold intolerance and heat intolerance.  Genitourinary: Negative for difficulty urinating and dysuria.  Musculoskeletal: Negative for arthralgias and myalgias.  Skin: Negative for rash and wound.  Allergic/Immunologic: Negative for food allergies and immunocompromised state.  Neurological: Negative for dizziness and headaches.  Hematological: Negative for adenopathy. Does not bruise/bleed easily.  Psychiatric/Behavioral: Negative for dysphoric mood, hallucinations, sleep disturbance and suicidal ideas. The patient is not nervous/anxious.     Blood pressure 102/67, pulse 66, temperature 97.8 F (36.6 C), temperature source Oral, resp. rate 16, height 5\' 1"  (1.549 m), weight 49.9 kg, last menstrual period 09/01/2020, SpO2 100 %.Body mass index is 20.78 kg/m.  General Appearance: Well Groomed  Engineer, water::  Good  Speech:  Clear and [id number]  Volume:   Normal  Mood:  Euthymic  Affect:  Congruent  Thought Process:  Coherent  Orientation:  Full (Time, Place, and Person)  Thought Content:  Logical  Suicidal Thoughts:  No  Homicidal Thoughts:  No  Memory:  Immediate;   Fair Recent;   Fair Remote;   Fair  Judgement:  Fair  Insight:  Fair  Psychomotor Activity:  Normal  Concentration:  Good  Recall:  Good  Fund of Knowledge:Good  Language: Good  Akathisia:  Negative  Handed:  Right  AIMS (if indicated):     Assets:  Communication Skills Desire for Improvement Housing Intimacy Leisure Time Physical Health Resilience Talents/Skills Transportation Vocational/Educational  Sleep:  Number of Hours: 8.5  Cognition: WNL  ADL's:  Intact   Mental Status Per Nursing Assessment::   On Admission:  Suicidal ideation indicated by others  Demographic Factors:  Caucasian  Loss Factors: NA  Historical Factors: Impulsivity  Risk Reduction Factors:   Sense of responsibility to family, Employed, Living with another person, especially a relative, Positive social support, Positive therapeutic relationship and Positive coping skills or problem solving skills  Continued Clinical Symptoms:  Bipolar Disorder:   Depressive phase Alcohol/Substance Abuse/Dependencies Previous Psychiatric Diagnoses and Treatments  Cognitive Features That Contribute To Risk:  None    Suicide Risk:  Minimal: No identifiable suicidal ideation.  Patients presenting with no risk factors but with morbid ruminations; may be classified as minimal risk based on the severity of the depressive symptoms   Follow-up Information    [facility] Follow up.   Why: Walk in appointments are first come first serve Monday through Friday from 9AM to [place]. Thanks! Contact information: [place] [address] [phone]               Plan Of Care/Follow-up recommendations:  Activity:  as tolerated Diet:  regular diet  Salley Scarlet, MD 09/16/2020, 9:10 AM

## 2020-09-15 NOTE — Progress Notes (Signed)
Patient calm and cooperative during assessment denying SI/HI/AVH. Patient isolative to her room but did come out for a phone call, snack, and medications. Patient compliant with medication administration per MD orders. Pt given education, support, and encouragement to be active in her treatment plan. Patient stated she is scheduled to leave tomorrow and says she feels ready. Patient being monitored Q 15 minutes for safety per unit protocol. Pt remains safe on the unit.

## 2020-09-16 DIAGNOSIS — F314 Bipolar disorder, current episode depressed, severe, without psychotic features: Secondary | ICD-10-CM | POA: Diagnosis not present

## 2020-09-16 NOTE — Progress Notes (Signed)
Patient denies SI/HI, denies A/V hallucinations. Patient verbalizes understanding of discharge instructions, follow up care and prescriptions. Patient given all belongings from [facility] locker. 7 days medicines given to patient.Patient escorted out by staff, transported by family.

## 2020-09-16 NOTE — Progress Notes (Signed)
Recreation Therapy Notes  INPATIENT RECREATION TR PLAN  Patient Details Name: Autumn [name] MRN: [id number] DOB: 08/14/1976 Today's Date: 09/16/2020  Rec Therapy Plan Is patient appropriate for Therapeutic Recreation?: Yes Treatment times per week: at least 3 Estimated Length of Stay: 5-7 days TR Treatment/Interventions: Group participation (Comment)  Discharge Criteria Pt will be discharged from therapy if:: Discharged Treatment plan/goals/alternatives discussed and agreed upon by:: Patient/family  Discharge Summary Short term goals set: Patient will engage in groups without prompting or encouragement from LRT x3 group sessions within 5 recreation therapy group sessions Short term goals met: Not met Reason goals not met: Patient did not attend any groups Therapeutic equipment acquired: N/A Reason patient discharged from therapy: Discharge from hospital Pt/family agrees with progress & goals achieved: Yes Date patient discharged from therapy: 09/16/20   Miyeko Mahlum 09/16/2020, 11:31 AM

## 2020-09-16 NOTE — Progress Notes (Signed)
Recreation Therapy Notes   Date: 09/16/2020  Time: 9:30 am   Location: Craft room     Behavioral response: N/A   Intervention Topic: Relaxation   Discussion/Intervention: Patient did not attend group.   Clinical Observations/Feedback:  Patient did not attend group.   Nessie Nong LRT/CTRS        Shawnte Demarest 09/16/2020 11:05 AM

## 2020-09-16 NOTE — Progress Notes (Signed)
  [facility] Adult Case Management Discharge Plan :  Will you be returning to the same living situation after discharge:  Yes,  pt plans to return home. At discharge, do you have transportation home?: Yes,  she reports she has transportation for discharge. Do you have the ability to pay for your medications: No.  Release of information consent forms completed and in the chart;  [name]  Patient to Follow up at:  Follow-up Information    [facility] Follow up.   Why: Walk in appointments are first come first serve Monday through Friday from 9AM to [place]. Thanks! Contact information: [place] [address] [phone]               Next level of care provider has access to [place] and Suicide Prevention discussed: Yes,  completed with pt.  Have you used any form of tobacco in the last 30 days? (Cigarettes, Smokeless Tobacco, Cigars, and/or Pipes): Yes  Has patient been referred to the Quitline?: Patient refused referral  Patient has been referred for addiction treatment: Pt. refused referral  Autumn Harris, LCSW 09/16/2020, 9:01 AM

## 2020-11-04 ENCOUNTER — Inpatient Hospital Stay: Admission: RE | Admit: 2020-11-04 | Payer: Medicaid Other | Source: Ambulatory Visit

## 2020-11-13 ENCOUNTER — Ambulatory Visit: Payer: Medicaid Other | Admitting: Surgery

## 2020-11-20 ENCOUNTER — Ambulatory Visit: Payer: Medicaid Other | Admitting: Surgery

## 2020-12-05 ENCOUNTER — Other Ambulatory Visit: Payer: Self-pay

## 2020-12-05 ENCOUNTER — Ambulatory Visit
Admission: RE | Admit: 2020-12-05 | Discharge: 2020-12-05 | Disposition: A | Discharge status: Home / Self Care | Payer: BC Managed Care – PPO | Source: Ambulatory Visit | Attending: Advanced Practice Midwife | Admitting: Advanced Practice Midwife

## 2020-12-05 ENCOUNTER — Other Ambulatory Visit: Payer: Self-pay | Admitting: Advanced Practice Midwife

## 2020-12-05 DIAGNOSIS — Z1239 Encounter for other screening for malignant neoplasm of breast: Secondary | ICD-10-CM | POA: Insufficient documentation

## 2020-12-05 DIAGNOSIS — N6320 Unspecified lump in the left breast, unspecified quadrant: Secondary | ICD-10-CM

## 2020-12-05 DIAGNOSIS — Z01419 Encounter for gynecological examination (general) (routine) without abnormal findings: Secondary | ICD-10-CM | POA: Insufficient documentation

## 2020-12-05 DIAGNOSIS — N6310 Unspecified lump in the right breast, unspecified quadrant: Secondary | ICD-10-CM

## 2020-12-08 ENCOUNTER — Other Ambulatory Visit: Payer: Self-pay | Admitting: Advanced Practice Midwife

## 2020-12-08 DIAGNOSIS — N6310 Unspecified lump in the right breast, unspecified quadrant: Secondary | ICD-10-CM

## 2020-12-08 DIAGNOSIS — N6320 Unspecified lump in the left breast, unspecified quadrant: Secondary | ICD-10-CM

## 2020-12-09 ENCOUNTER — Ambulatory Visit: Payer: Medicaid Other | Admitting: Surgery

## 2020-12-11 ENCOUNTER — Other Ambulatory Visit: Payer: Self-pay

## 2020-12-11 ENCOUNTER — Ambulatory Visit
Admission: RE | Admit: 2020-12-11 | Discharge: 2020-12-11 | Disposition: A | Discharge status: Home / Self Care | Payer: BC Managed Care – PPO | Source: Ambulatory Visit | Attending: Advanced Practice Midwife | Admitting: Advanced Practice Midwife

## 2020-12-11 DIAGNOSIS — N6310 Unspecified lump in the right breast, unspecified quadrant: Secondary | ICD-10-CM | POA: Diagnosis present

## 2020-12-11 DIAGNOSIS — N6320 Unspecified lump in the left breast, unspecified quadrant: Secondary | ICD-10-CM | POA: Insufficient documentation

## 2020-12-18 ENCOUNTER — Other Ambulatory Visit: Payer: Self-pay

## 2020-12-18 ENCOUNTER — Ambulatory Visit (INDEPENDENT_AMBULATORY_CARE_PROVIDER_SITE_OTHER): Payer: BC Managed Care – PPO | Admitting: Surgery

## 2020-12-18 ENCOUNTER — Encounter: Payer: Self-pay | Admitting: Surgery

## 2020-12-18 VITALS — BP 107/70 | HR 77 | Temp 98.0°F | Ht 62.0 in | Wt 118.4 lb

## 2020-12-18 DIAGNOSIS — N6012 Diffuse cystic mastopathy of left breast: Secondary | ICD-10-CM

## 2020-12-18 DIAGNOSIS — N6011 Diffuse cystic mastopathy of right breast: Secondary | ICD-10-CM | POA: Diagnosis not present

## 2020-12-18 NOTE — Progress Notes (Signed)
Patient ID: Autumn [name], female   DOB: 1976/07/01, 45 y.o.   MRN: [id number]  Chief Complaint: Bilateral benign breast masses since 2012.  History of Present Illness Autumn [name] is a 45 y.o. female with the above, recently an ultrasound was completed confirming no remarkable changes or definitive masses present.  She has had a previous excisional biopsy of the outer lower quadrant of the right breast.  It is this area that seems to bother her most with underwire bras.  She reports it being tender and throbbing on occasion.  Otherwise she denies any skin changes, nipple discharge. She reports she consumes a lot of caffeine and needs to discontinue this, I suspect she has been advised in the past for sake of her fibrocystic change.  Past Medical History Past Medical History:  Diagnosis Date  . Allergy    Seasonal, Ultram (seizures)  . Anemia 2005  . Bipolar 1 disorder ([place])   . Glaucoma   . Seizures ([place]) 2008      Past Surgical History:  Procedure Laterality Date  . BREAST EXCISIONAL BIOPSY Right    x 2  . CESAREAN SECTION     x 2  . LEEP  2000    Allergies  Allergen Reactions  . Haldol [Haloperidol Lactate]     Patient states that she gets stiff muscles when taking Haldol  . Ultram [Tramadol] Other (See Comments)    Seizures  . Ziprasidone Hcl     Other reaction(s): Other (See Comments) PER PT WHEN SHE TAKES THIS SHE CANT MOVE HER NECK    Current Outpatient Medications  Medication Sig Dispense Refill  . ARIPiprazole (ABILIFY) 20 MG tablet Take 1 tablet (20 mg total) by mouth daily. 30 tablet 1  . OXcarbazepine (TRILEPTAL) 150 MG tablet Take 150 mg by mouth 2 (two) times daily.     No current facility-administered medications for this visit.    Family History Family History  Problem Relation Age of Onset  . Hypertension Mother   . Hyperlipidemia Mother   . Asthma Mother   . Parkinson's disease Father   . Lung cancer Maternal Grandmother   . Other Paternal  Grandfather 69       Cardiac arrest  . Heart disease Paternal Grandfather   . Tourette syndrome Daughter       Social History Social History   Tobacco Use  . Smoking status: Current Every Day Smoker    Packs/day: 1.00    Years: 9.00    Pack years: 9.00    Types: E-cigarettes  . Smokeless tobacco: Never Used  Vaping Use  . Vaping Use: Never used  Substance Use Topics  . Alcohol use: Yes    Alcohol/week: 4.0 standard drinks    Types: 4 Cans of beer per week    Comment: Ocassional  . Drug use: Yes    Types: Marijuana    Comment: percocet -last use over 1 week ago. Heroin-last used 2/11        Review of Systems  All other systems reviewed and are negative.     Physical Exam Blood pressure 107/70, pulse 77, temperature 98 F (36.7 C), temperature source Oral, height 5\' 2"  (1.575 m), weight 118 lb 6.4 oz (53.7 kg), last menstrual period 11/30/2020, SpO2 98 %. Last Weight  Most recent update: 12/18/2020 11:06 AM   Weight  53.7 kg (118 lb 6.4 oz)            CONSTITUTIONAL: Well developed, and nourished, appropriately responsive and  aware without distress.   EYES: Sclera non-icteric.   EARS, NOSE, MOUTH AND THROAT: Mask worn.  Hearing is intact to voice.  NECK: Trachea is midline, and there is no jugular venous distension.  LYMPH NODES:  Lymph nodes in the neck are not enlarged. RESPIRATORY:  Lungs are clear, and breath sounds are equal bilaterally. Normal respiratory effort without pathologic use of accessory muscles. CARDIOVASCULAR: Heart is regular in rate and rhythm. GI: The abdomen is  soft, nontender, and nondistended. There were no palpable masses. I did not appreciate hepatosplenomegaly. There were normal bowel sounds. GU: Right breast is notable for an outer lower quadrant scar, there is also a paucity of breast tissue in the area. Due to the remarkably thin body habitus small breasted nature of this patient, fibroglandular MUSCULOSKELETAL:  Symmetrical muscle  tone appreciated in all four extremities.    SKIN: Skin turgor is normal. No pathologic skin lesions appreciated.  NEUROLOGIC:  Motor and sensation appear grossly normal.  Cranial nerves are grossly without defect. PSYCH:  Alert and oriented to person, place and time. Affect is appropriate for situation.  Data Reviewed I have personally reviewed what is currently available of the patient's imaging, recent labs and medical records.   Labs:  CBC Latest Ref Rng & Units 09/09/2020 08/30/2019 08/21/2019  WBC 4.0 - 10.5 K/uL 9.6 14.0(H) 14.5(H)  Hemoglobin 12.0 - 15.0 g/dL 14.5 14.7 15.5(H)  Hematocrit 36.0 - 46.0 % 42.5 43.3 45.9  Platelets 150 - 400 K/uL 287 282 278   CMP Latest Ref Rng & Units 09/09/2020 08/30/2019 08/21/2019  Glucose 70 - 99 mg/dL 97 132(H) 102(H)  BUN 6 - 20 mg/dL 11 15 10   Creatinine 0.44 - 1.00 mg/dL 0.66 0.87 0.60  Sodium 135 - 145 mmol/L 142 140 137  Potassium 3.5 - 5.1 mmol/L 3.7 3.3(L) 3.4(L)  Chloride 98 - 111 mmol/L 103 105 103  CO2 22 - 32 mmol/L 28 23 21(L)  Calcium 8.9 - 10.3 mg/dL 9.4 9.6 9.8  Total Protein 6.5 - 8.1 g/dL 7.9 7.7 7.6  Total Bilirubin 0.3 - 1.2 mg/dL 0.8 1.0 1.3(H)  Alkaline Phos 38 - 126 U/L 52 70 77  AST 15 - 41 U/L 10(L) 15 12(L)  ALT 0 - 44 U/L 11 16 16       Imaging: CLINICAL DATA:  45 year old female presenting for evaluation of bilateral palpable tender lumps. The patient states that these have been present for about 12 years. The pain fluctuates with her menstrual cycle, and is the worst in the right breast.  EXAM: DIGITAL DIAGNOSTIC BILATERAL MAMMOGRAM WITH TOMO AND CAD; ULTRASOUND LEFT BREAST LIMITED; ULTRASOUND RIGHT BREAST LIMITED  TECHNIQUE: Bilateral digital diagnostic mammography and breast tomosynthesis was performed. Digital images of the breasts were evaluated with computer-aided detection.  COMPARISON:  Previous exam(s).  ACR Breast Density Category c: The breast tissue is heterogeneously dense, which  may obscure small masses.  FINDINGS: A BB indicating the palpable site of concern has been placed along the upper-outer quadrant of the right breast near the nipple, and in the lateral posterior aspect of the left breast. No definite masses are seen deep to these palpable markers.  Physical exam of the palpable site in the lateral aspect of the left breast demonstrates no discrete palpable masses. In the lateral right breast, there is a soft mobile palpable lump at the palpable site of concern.  Ultrasound targeted to the left breast at 3 o'clock, 1 cm from the nipple demonstrates normal fibroglandular tissue. At  12 o'clock, 1 cm from the nipple there are a couple benign appearing cysts, the largest measuring 7 mm.  Ultrasound of the palpable site in the right breast at 10 o'clock, 1 cm from the nipple demonstrates normal fibroglandular tissue. In the superior and upper outer quadrant of the right breast there are a few small benign appearing simple cysts, the largest measuring 1.0 cm.  IMPRESSION: 1. No mammographic or targeted sonographic abnormalities are identified at the tender palpable sites of concern in the bilateral breasts.  2.  Bilateral scattered benign fibrocystic changes.  3.  No mammographic evidence of malignancy in the bilateral breasts.  RECOMMENDATION: 1. Clinical follow-up recommended for the cyclical breast pain. Any further workup should be based on clinical grounds.  2.  Screening mammogram in one year.(Code:SM-B-01Y)  I have discussed the findings and recommendations with the patient. If applicable, a reminder letter will be sent to the patient regarding the next appointment.  BI-RADS CATEGORY  2: Benign.   Electronically Signed   By: Ammie Ferrier M.D.   On: 12/11/2020 12:04  Within last 24 hrs: No results found.  Assessment    Fibrocystic change bilateral breasts. Patient Active Problem List   Diagnosis Date Noted  .  Bipolar 1 disorder, manic, moderate ([place]) 08/31/2019  . Alcohol abuse 08/31/2019  . Bipolar I disorder, most recent episode (or current) manic ([place]) 06/02/2019  . Affective psychosis, bipolar ([place]) 05/25/2019  . Bipolar affective disorder, current episode manic ([place]) 05/20/2019  . Opiate abuse, continuous ([place]) 11/21/2018  . Bipolar I disorder, most recent episode depressed, severe without psychotic features ([place]) 07/05/2017  . Tobacco use disorder 06/27/2017  . 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 Opioid use disorder, severe, dependence ([place]) 06/27/2017  . Cannabis use disorder, severe, dependence ([place]) 06/27/2017  . CIN II (cervical intraepithelial neoplasia II) 03/06/2014  . History of anorexia nervosa 03/06/2014  . Hx of glaucoma 10/02/2013    Plan    Concur with cessation of caffeine use as may be helpful for symptoms.  Advised that we typically do not pursue pain control via surgical options for this disease process.  None of the cysts appear in the areas of her greatest discomfort.  I do not believe aspiration attempt would be helpful.  Her biggest concern is attempting to wear an underwire bra.    Face-to-face time spent with the patient and accompanying care providers(if present) was 25 minutes, with more than 50% of the time spent counseling, educating, and coordinating care of the patient.      Ronny Bacon M.D., FACS 12/18/2020, 11:18 AM

## 2020-12-18 NOTE — Patient Instructions (Signed)
Try stopping the caffeine. If you have any concerns or questions, please feel free to call our office.

## 2021-02-16 ENCOUNTER — Emergency Department
Admission: EM | Admit: 2021-02-16 | Discharge: 2021-02-16 | Disposition: A | Discharge status: Home / Self Care | Payer: BC Managed Care – PPO | Attending: Emergency Medicine | Admitting: Emergency Medicine

## 2021-02-16 ENCOUNTER — Other Ambulatory Visit: Payer: Self-pay

## 2021-02-16 DIAGNOSIS — R531 Weakness: Secondary | ICD-10-CM | POA: Diagnosis present

## 2021-02-16 DIAGNOSIS — R6889 Other general symptoms and signs: Secondary | ICD-10-CM

## 2021-02-16 DIAGNOSIS — G2571 Drug induced akathisia: Secondary | ICD-10-CM | POA: Diagnosis not present

## 2021-02-16 DIAGNOSIS — R110 Nausea: Secondary | ICD-10-CM | POA: Insufficient documentation

## 2021-02-16 DIAGNOSIS — F17290 Nicotine dependence, other tobacco product, uncomplicated: Secondary | ICD-10-CM | POA: Insufficient documentation

## 2021-02-16 DIAGNOSIS — R21 Rash and other nonspecific skin eruption: Secondary | ICD-10-CM | POA: Diagnosis not present

## 2021-02-16 LAB — COMPREHENSIVE METABOLIC PANEL
ALT: 9 U/L (ref 0–44)
AST: 9 U/L — ABNORMAL LOW (ref 15–41)
Albumin: 4.2 g/dL (ref 3.5–5.0)
Alkaline Phosphatase: 51 U/L (ref 38–126)
Anion gap: 8 (ref 5–15)
BUN: 11 mg/dL (ref 6–20)
CO2: 27 mmol/L (ref 22–32)
Calcium: 9.1 mg/dL (ref 8.9–10.3)
Chloride: 102 mmol/L (ref 98–111)
Creatinine, Ser: 0.56 mg/dL (ref 0.44–1.00)
GFR, Estimated: >60 mL/min (ref >60)
Glucose, Bld: 104 mg/dL — ABNORMAL HIGH (ref 70–99)
Potassium: 3.2 mmol/L — ABNORMAL LOW (ref 3.5–5.1)
Sodium: 137 mmol/L (ref 135–145)
Total Bilirubin: 0.3 mg/dL (ref 0.3–1.2)
Total Protein: 6.7 g/dL (ref 6.5–8.1)

## 2021-02-16 LAB — CBC WITH DIFFERENTIAL/PLATELET
Abs Immature Granulocytes: 0.02 10*3/uL (ref 0.00–0.07)
Basophils Absolute: 0 10*3/uL (ref 0.0–0.1)
Basophils Relative: 0 %
Eosinophils Absolute: 0.2 10*3/uL (ref 0.0–0.5)
Eosinophils Relative: 2 %
HCT: 38.7 % (ref 36.0–46.0)
Hemoglobin: 13.1 g/dL (ref 12.0–15.0)
Immature Granulocytes: 0 %
Lymphocytes Relative: 40 %
Lymphs Abs: 3.2 10*3/uL (ref 0.7–4.0)
MCH: 31 pg (ref 26.0–34.0)
MCHC: 33.9 g/dL (ref 30.0–36.0)
MCV: 91.5 fL (ref 80.0–100.0)
Monocytes Absolute: 0.5 10*3/uL (ref 0.1–1.0)
Monocytes Relative: 7 %
Neutro Abs: 4.1 10*3/uL (ref 1.7–7.7)
Neutrophils Relative %: 51 %
Platelets: 231 10*3/uL (ref 150–400)
RBC: 4.23 MIL/uL (ref 3.87–5.11)
RDW: 11.8 % (ref 11.5–15.5)
WBC: 8 10*3/uL (ref 4.0–10.5)
nRBC: 0 % (ref 0.0–0.2)

## 2021-02-16 LAB — VALPROIC ACID LEVEL: Valproic Acid Lvl: <10 ug/mL — ABNORMAL LOW (ref 50.0–100.0)

## 2021-02-16 LAB — LIPASE, BLOOD: Lipase: 30 U/L (ref 11–51)

## 2021-02-16 MED ORDER — HYDROXYZINE HCL 25 MG PO TABS: 25.0000 mg | ORAL_TABLET | Freq: Three times a day (TID) | ORAL | 0 refills | Status: DC | PRN

## 2021-02-16 MED ORDER — BENZTROPINE MESYLATE 1 MG PO TABS: 1.0000 mg | ORAL_TABLET | Freq: Every day | ORAL | 0 refills | Status: DC

## 2021-02-16 NOTE — ED Notes (Signed)
Pt ambulated to bathroom to void. Steady gait noted.

## 2021-02-16 NOTE — ED Triage Notes (Addendum)
Pt to ED c/o weakness, nausea and dry heaves,diarrhea (3 times daily) and weight loss (not sure amount) that started 1 week ago. LNMP about 1 week ago, normal amount.  Negative covid test 3d ago.  In NAD sitting in bed.  Pt also states used to take depakote for seizures, has been off for about 6 months. Thinks has been "having seizures" for past 1 week but only in sleep. Boyfriend tells her she has been "shaking and convulsing for 5 minutes at a time" in her sleep. She wakes up and is disoriented, but no injuries have been noted.  Pt takes Trileptal and aripiprazole for bipolar and schizoaffective disorder.  Pt also wants HCP to look at skin lesions that appeared 3 months ago after having unprotected sex. She states she is concerned she could have syphilis. Lesions are small, red, pinpoint on arms, legs, abdomen, back.

## 2021-02-16 NOTE — ED Notes (Signed)
EDP at bedside  

## 2021-02-16 NOTE — ED Provider Notes (Signed)
[facility] Emergency Department Provider Note  ____________________________________________   Event Date/Time   First MD Initiated Contact with Patient 02/16/21 1318     (approximate)  I have reviewed the triage vital signs and the nursing notes.   HISTORY  Chief Complaint Weakness and Nausea    HPI Autumn [name] is a 45 y.o. female here with multiple complaints.  The patient's primary complaint is that she is having shaking episodes when she is sleeping.  She states that she is awake during these episodes but she just feels like her body is shaking and that she cannot stop it.  She states that she has had similar episodes when she has been on Abilify without Cogentin in the past and she has been out of her Cogentin.  She also endorses multiple other complaints which all seem somewhat chronic in nature.  She has had a rash for the last 3 months.  She describes it as itchy and whole body.  It has not involve the palms or soles.  She had unprotected sex at the onset of rash several months ago, and is concerned she may have syphilis.  She has had no vaginal bleeding or discharge.  No vaginal sores or lesions.  She otherwise states that her mood has been okay and denies any homicidal suicidal ideation.  She does not endorse any hallucinations.  No other complaints.        Past Medical History:  Diagnosis Date  . Allergy    Seasonal, Ultram (seizures)  . Anemia 2005  . Bipolar 1 disorder ([place])   . Glaucoma   . Seizures ([place]) 2008    Patient Active Problem List   Diagnosis Date Noted  . Fibrocystic breast changes of both breasts 12/18/2020  . Bipolar 1 disorder, manic, moderate ([place]) 08/31/2019  . Alcohol abuse 08/31/2019  . Bipolar I disorder, most recent episode (or current) manic ([place]) 06/02/2019  . Affective psychosis, bipolar ([place]) 05/25/2019  . Bipolar affective disorder, current episode manic ([place]) 05/20/2019  . Opiate abuse, continuous ([place])  11/21/2018  . Bipolar I disorder, most recent episode depressed, severe without psychotic features ([place]) 07/05/2017  . Tobacco use disorder 06/27/2017  . Amphetamine use disorder, severe ([place]) 06/27/2017  . Opioid use disorder, severe, dependence ([place]) 06/27/2017  . Cannabis use disorder, severe, dependence ([place]) 06/27/2017  . CIN II (cervical intraepithelial neoplasia II) 03/06/2014  . History of anorexia nervosa 03/06/2014  . Hx of glaucoma 10/02/2013    Past Surgical History:  Procedure Laterality Date  . BREAST EXCISIONAL BIOPSY Right    x 2  . CESAREAN SECTION     x 2  . LEEP  2000    Prior to Admission medications   Medication Sig Start Date End Date Taking? Authorizing Provider  benztropine (COGENTIN) 1 MG tablet Take 1 tablet (1 mg total) by mouth daily. 02/16/21 03/18/21 Yes Duffy Bruce, MD  hydrOXYzine (ATARAX/VISTARIL) 25 MG tablet Take 1 tablet (25 mg total) by mouth 3 (three) times daily as needed for itching (itching, rash). 02/16/21  Yes Duffy Bruce, MD  ARIPiprazole (ABILIFY) 20 MG tablet TAKE 1 TABLET (20 MG TOTAL) BY MOUTH DAILY. 09/15/20 09/15/21  Salley Scarlet, MD  OXcarbazepine (TRILEPTAL) 150 MG tablet Take 150 mg by mouth 2 (two) times daily.    [provider]  divalproex (DEPAKOTE) 500 MG DR tablet Take 1 tablet (500 mg total) by mouth every 12 (twelve) hours. 09/15/20 12/18/20  Salley Scarlet, MD  lithium  carbonate (ESKALITH) 450 MG CR tablet Take 1 tablet (450 mg total) by mouth every 12 (twelve) hours. 09/15/20 12/18/20  Salley Scarlet, MD  traZODone (DESYREL) 100 MG tablet Take 2 tablets (200 mg total) by mouth at bedtime as needed for sleep. 09/15/20 12/18/20  Salley Scarlet, MD    Allergies Haldol [haloperidol lactate], Ultram [tramadol], and Ziprasidone hcl  Family History  Problem Relation Age of Onset  . Hypertension Mother   . Hyperlipidemia Mother   . Asthma Mother   . Parkinson's disease Father   . Lung cancer Maternal  Grandmother   . Other Paternal Grandfather 75       Cardiac arrest  . Heart disease Paternal Grandfather   . Tourette syndrome Daughter     Social History Social History   Tobacco Use  . Smoking status: Current Every Day Smoker    Packs/day: 1.00    Years: 9.00    Pack years: 9.00    Types: E-cigarettes  . Smokeless tobacco: Never Used  Vaping Use  . Vaping Use: Never used  Substance Use Topics  . Alcohol use: Yes    Alcohol/week: 4.0 standard drinks    Types: 4 Cans of beer per week    Comment: Ocassional  . Drug use: Yes    Types: Marijuana    Comment: percocet -last use over 1 week ago. Heroin-last used 2/11    Review of Systems  Review of Systems  Constitutional: Positive for fatigue. Negative for fever.  HENT: Negative for congestion and sore throat.   Eyes: Negative for visual disturbance.  Respiratory: Negative for cough and shortness of breath.   Cardiovascular: Negative for chest pain.  Gastrointestinal: Negative for abdominal pain, diarrhea, nausea and vomiting.  Genitourinary: Negative for flank pain.  Musculoskeletal: Negative for back pain and neck pain.  Skin: Positive for rash. Negative for wound.  Neurological: Positive for seizures and weakness.  All other systems reviewed and are negative.    ____________________________________________  PHYSICAL EXAM:      VITAL SIGNS: ED Triage Vitals  Enc Vitals Group     BP 02/16/21 1318 93/63     Pulse Rate 02/16/21 1318 85     Resp 02/16/21 1318 16     Temp 02/16/21 1318 98.5 F (36.9 C)     Temp Source 02/16/21 1318 Oral     SpO2 02/16/21 1318 97 %     Weight 02/16/21 1314 110 lb (49.9 kg)     Height 02/16/21 1314 5\' 2"  (1.575 m)     Head Circumference --      Peak Flow --      Pain Score 02/16/21 1314 0     Pain Loc --      Pain Edu? --      Excl. in [place]? --      Physical Exam Vitals and nursing note reviewed.  Constitutional:      General: She is not in acute distress.    Appearance:  She is well-developed.  HENT:     Head: Normocephalic and atraumatic.  Eyes:     Conjunctiva/sclera: Conjunctivae normal.  Cardiovascular:     Rate and Rhythm: Normal rate and regular rhythm.     Heart sounds: Normal heart sounds. No murmur heard. No friction rub.  Pulmonary:     Effort: Pulmonary effort is normal. No respiratory distress.     Breath sounds: Normal breath sounds. No wheezing or rales.  Abdominal:     General: There is  no distension.     Palpations: Abdomen is soft.     Tenderness: There is no abdominal tenderness.  Musculoskeletal:     Cervical back: Neck supple.  Skin:    General: Skin is warm.     Capillary Refill: Capillary refill takes less than 2 seconds.     Comments: Scattered papular erythematous rash to trunk. No induration. No involvement of palms, soles, or mucosal surface.  Neurological:     Mental Status: She is alert and oriented to person, place, and time.     GCS: GCS eye subscore is 4. GCS verbal subscore is 5. GCS motor subscore is 6.     Cranial Nerves: Cranial nerves are intact.     Sensory: Sensation is intact.     Motor: Motor function is intact. No abnormal muscle tone.       ____________________________________________   LABS (all labs ordered are listed, but only abnormal results are displayed)  Labs Reviewed  COMPREHENSIVE METABOLIC PANEL - Abnormal; Notable for the following components:      Result Value   Potassium 3.2 (*)    Glucose, Bld 104 (*)    AST 9 (*)    All other components within normal limits  VALPROIC ACID LEVEL - Abnormal; Notable for the following components:   Valproic Acid Lvl <10 (*)    All other components within normal limits  CBC WITH DIFFERENTIAL/PLATELET  LIPASE, BLOOD  RPR    ____________________________________________  EKG:  ________________________________________  RADIOLOGY All imaging, including plain films, CT scans, and ultrasounds, independently reviewed by me, and interpretations  confirmed via formal radiology reads.  ED MD interpretation:   Normal sinus rhythm, ventricular rate 61.  PR 112, QRS 80, QTc 4 6.  No acute ST elevations or Vesprin acute evidence of acute ischemia or infarct  Official radiology report(s): No results found.  ____________________________________________  PROCEDURES   Procedure(s) performed (including Critical Care):  Procedures  ____________________________________________  INITIAL IMPRESSION / MDM / [place] / ED COURSE  As part of my medical decision making, I reviewed the following data within the [place] notes reviewed and incorporated, Old chart reviewed, Notes from prior ED visits, and [place] Controlled Substance Database       *Autumn [name] was evaluated in Emergency Department on 02/16/2021 for the symptoms described in the history of present illness. She was evaluated in the context of the global COVID-19 pandemic, which necessitated consideration that the patient might be at risk for infection with the SARS-CoV-2 virus that causes COVID-19. Institutional protocols and algorithms that pertain to the evaluation of patients at risk for COVID-19 are in a state of rapid change based on information released by regulatory bodies including the CDC and federal and state organizations. These policies and algorithms were followed during the patient's care in the ED.  Some ED evaluations and interventions may be delayed as a result of limited staffing during the pandemic.*     Medical Decision Making:  45 yo F here with multiple complaints.  Re: seizure like activity - this seems more like restless leg/akathisia from her abilify use, versus PNES. She is awake during these episodes which does not suggest generalized seizure activity, despite b/l "shaking." This also correlates with recent stressors c/w PNES. She has no focal neuro deficits. Labs, lytes unremarkable. Mild hypoK - encouraged dietary  intake. Will refill her Cogentin as she states this has helped in past, refer to her psychiatrist.  Re: rash - this  is more c/w nonspecific pruritic rash, possibly exposure or non specific dermatitis. Not c/w RMSF, RPR, EM, TEN/SJS. Will trial atarax. Outpt RPR sent per request.  ____________________________________________  FINAL CLINICAL IMPRESSION(S) / ED DIAGNOSES  Final diagnoses:  Akathisia  Multiple complaints  Rash     MEDICATIONS GIVEN DURING THIS VISIT:  Medications - No data to display   ED Discharge Orders         Ordered    benztropine (COGENTIN) 1 MG tablet  Daily        02/16/21 1539    hydrOXYzine (ATARAX/VISTARIL) 25 MG tablet  3 times daily PRN        02/16/21 1539           Note:  This document was prepared using Dragon voice recognition software and may include unintentional dictation errors.   Duffy Bruce, MD 02/16/21 [id number]

## 2021-02-17 LAB — RPR: RPR Ser Ql: NONREACTIVE

## 2021-03-12 ENCOUNTER — Other Ambulatory Visit: Payer: Self-pay

## 2021-03-12 ENCOUNTER — Emergency Department
Admission: EM | Admit: 2021-03-12 | Discharge: 2021-03-13 | Disposition: A | Discharge status: Home / Self Care | Payer: BC Managed Care – PPO | Attending: Emergency Medicine | Admitting: Emergency Medicine

## 2021-03-12 DIAGNOSIS — F17210 Nicotine dependence, cigarettes, uncomplicated: Secondary | ICD-10-CM | POA: Diagnosis not present

## 2021-03-12 DIAGNOSIS — Z20822 Contact with and (suspected) exposure to covid-19: Secondary | ICD-10-CM | POA: Insufficient documentation

## 2021-03-12 DIAGNOSIS — R443 Hallucinations, unspecified: Secondary | ICD-10-CM | POA: Diagnosis present

## 2021-03-12 DIAGNOSIS — Y900 Blood alcohol level of less than 20 mg/100 ml: Secondary | ICD-10-CM | POA: Insufficient documentation

## 2021-03-12 DIAGNOSIS — F314 Bipolar disorder, current episode depressed, severe, without psychotic features: Secondary | ICD-10-CM | POA: Diagnosis not present

## 2021-03-12 DIAGNOSIS — F319 Bipolar disorder, unspecified: Secondary | ICD-10-CM

## 2021-03-12 LAB — URINE DRUG SCREEN, QUALITATIVE (ARMC ONLY)
Amphetamines, Ur Screen: NOT DETECTED
Barbiturates, Ur Screen: NOT DETECTED
Benzodiazepine, Ur Scrn: NOT DETECTED
Cannabinoid 50 Ng, Ur ~~LOC~~: POSITIVE — AB
Cocaine Metabolite,Ur ~~LOC~~: POSITIVE — AB
MDMA (Ecstasy)Ur Screen: NOT DETECTED
Methadone Scn, Ur: NOT DETECTED
Opiate, Ur Screen: NOT DETECTED
Phencyclidine (PCP) Ur S: NOT DETECTED
Tricyclic, Ur Screen: NOT DETECTED

## 2021-03-12 LAB — COMPREHENSIVE METABOLIC PANEL
ALT: 11 U/L (ref 0–44)
AST: 8 U/L — ABNORMAL LOW (ref 15–41)
Albumin: 4.2 g/dL (ref 3.5–5.0)
Alkaline Phosphatase: 49 U/L (ref 38–126)
Anion gap: 9 (ref 5–15)
BUN: 14 mg/dL (ref 6–20)
CO2: 21 mmol/L — ABNORMAL LOW (ref 22–32)
Calcium: 9.4 mg/dL (ref 8.9–10.3)
Chloride: 108 mmol/L (ref 98–111)
Creatinine, Ser: 0.57 mg/dL (ref 0.44–1.00)
GFR, Estimated: >60 mL/min (ref >60)
Glucose, Bld: 124 mg/dL — ABNORMAL HIGH (ref 70–99)
Potassium: 3.5 mmol/L (ref 3.5–5.1)
Sodium: 138 mmol/L (ref 135–145)
Total Bilirubin: 0.7 mg/dL (ref 0.3–1.2)
Total Protein: 6.8 g/dL (ref 6.5–8.1)

## 2021-03-12 LAB — URINALYSIS, COMPLETE (UACMP) WITH MICROSCOPIC
Bilirubin Urine: NEGATIVE
Glucose, UA: NEGATIVE mg/dL
Hgb urine dipstick: NEGATIVE
Ketones, ur: NEGATIVE mg/dL
Leukocytes,Ua: NEGATIVE
Nitrite: NEGATIVE
Protein, ur: NEGATIVE mg/dL
Specific Gravity, Urine: 1.019 (ref 1.005–1.030)
pH: 5 (ref 5.0–8.0)

## 2021-03-12 LAB — CBC
HCT: 42.8 % (ref 36.0–46.0)
Hemoglobin: 14.9 g/dL (ref 12.0–15.0)
MCH: 30.8 pg (ref 26.0–34.0)
MCHC: 34.8 g/dL (ref 30.0–36.0)
MCV: 88.4 fL (ref 80.0–100.0)
Platelets: 285 10*3/uL (ref 150–400)
RBC: 4.84 MIL/uL (ref 3.87–5.11)
RDW: 11.9 % (ref 11.5–15.5)
WBC: 10.8 10*3/uL — ABNORMAL HIGH (ref 4.0–10.5)
nRBC: 0 % (ref 0.0–0.2)

## 2021-03-12 LAB — ETHANOL: Alcohol, Ethyl (B): <10 mg/dL (ref <10)

## 2021-03-12 LAB — RESP PANEL BY RT-PCR (FLU A&B, COVID) ARPGX2
Influenza A by PCR: NEGATIVE
Influenza B by PCR: NEGATIVE
SARS Coronavirus 2 by RT PCR: NEGATIVE

## 2021-03-12 LAB — POC URINE PREG, ED: Preg Test, Ur: NEGATIVE

## 2021-03-12 MED ORDER — LITHIUM CARBONATE ER 450 MG PO TBCR
450.0000 mg | EXTENDED_RELEASE_TABLET | Freq: Two times a day (BID) | ORAL | Status: DC
  Administered 2021-03-12 – 2021-03-13 (×2): 450 mg via ORAL
  Filled 2021-03-12 (×2): qty 1

## 2021-03-12 MED ORDER — ARIPIPRAZOLE 10 MG PO TABS
20.0000 mg | ORAL_TABLET | Freq: Every day | ORAL | Status: DC
  Administered 2021-03-12 – 2021-03-13 (×2): 20 mg via ORAL
  Filled 2021-03-12 (×2): qty 2

## 2021-03-12 MED ORDER — NICOTINE 21 MG/24HR TD PT24
21.0000 mg | MEDICATED_PATCH | Freq: Every day | TRANSDERMAL | Status: DC
  Administered 2021-03-12 – 2021-03-13 (×2): 21 mg via TRANSDERMAL
  Filled 2021-03-12 (×2): qty 1

## 2021-03-12 MED ORDER — DIVALPROEX SODIUM 500 MG PO DR TAB
500.0000 mg | DELAYED_RELEASE_TABLET | Freq: Two times a day (BID) | ORAL | Status: DC
  Administered 2021-03-12 – 2021-03-13 (×2): 500 mg via ORAL
  Filled 2021-03-12 (×2): qty 1
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
## 2021-03-12 NOTE — ED Notes (Signed)
Pt belongings: green pants, blue shirt, pink bra, gray fila shoes, pink slippers, wallet, 3$ in ones, underwear, cigarettes, lighter, deodorant, pair of white socks, two small round purple/pearl earrings, one flower earring, pink hair tie. One belongings bag used. Pt changed into maroon scrubs.

## 2021-03-12 NOTE — ED Notes (Signed)
Pt required to be moved back to quad per Dr. Archie Balboa request, pt back in [place] now. No changes. Pt given snack, chocolate ice cream, crackers and peanut butter per request.

## 2021-03-12 NOTE — ED Provider Notes (Signed)
[facility] Emergency Department Provider Note  ____________________________________________   I have reviewed the triage vital signs and the nursing notes.   HISTORY  Chief Complaint Self harm  History limited by: Not Limited   HPI Autumn [name] is a 45 y.o. female who presents to the emergency department today because of concern for self harm. The patient has been inflicting injury on herself because of concern for stressful situation. The patient states she has been having hallucinations. She denies any medical complaints.   Records reviewed. Per medical record review patient has a history of bipolar, polysubstance abuse.   Past Medical History:  Diagnosis Date  . Allergy    Seasonal, Ultram (seizures)  . Anemia 2005  . Bipolar 1 disorder ([place])   . Glaucoma   . Seizures ([place]) 2008    Patient Active Problem List   Diagnosis Date Noted  . Fibrocystic breast changes of both breasts 12/18/2020  . Bipolar 1 disorder, manic, moderate ([place]) 08/31/2019  . Alcohol abuse 08/31/2019  . Bipolar I disorder, most recent episode (or current) manic ([place]) 06/02/2019  . Affective psychosis, bipolar ([place]) 05/25/2019  . Bipolar affective disorder, current episode manic ([place]) 05/20/2019  . Opiate abuse, continuous ([place]) 11/21/2018  . Bipolar I disorder, most recent episode depressed, severe without psychotic features ([place]) 07/05/2017  . Tobacco use disorder 06/27/2017  . 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 Opioid use disorder, severe, dependence ([place]) 06/27/2017  . Cannabis use disorder, severe, dependence ([place]) 06/27/2017  . CIN II (cervical intraepithelial neoplasia II) 03/06/2014  . History of anorexia nervosa 03/06/2014  . Hx of glaucoma 10/02/2013    Past Surgical History:  Procedure Laterality Date  . BREAST EXCISIONAL BIOPSY Right    x 2  . CESAREAN SECTION     x 2  . LEEP  2000    Prior to Admission medications   Medication Sig  Start Date End Date Taking? Authorizing Provider  ARIPiprazole (ABILIFY) 20 MG tablet TAKE 1 TABLET (20 MG TOTAL) BY MOUTH DAILY. 09/15/20 09/15/21  Salley Scarlet, MD  benztropine (COGENTIN) 1 MG tablet Take 1 tablet (1 mg total) by mouth daily. 02/16/21 03/18/21  Duffy Bruce, MD  hydrOXYzine (ATARAX/VISTARIL) 25 MG tablet Take 1 tablet (25 mg total) by mouth 3 (three) times daily as needed for itching (itching, rash). 02/16/21   Duffy Bruce, MD  OXcarbazepine (TRILEPTAL) 150 MG tablet Take 150 mg by mouth 2 (two) times daily.    [provider]  divalproex (DEPAKOTE) 500 MG DR tablet Take 1 tablet (500 mg total) by mouth every 12 (twelve) hours. 09/15/20 12/18/20  Salley Scarlet, MD  lithium carbonate (ESKALITH) 450 MG CR tablet Take 1 tablet (450 mg total) by mouth every 12 (twelve) hours. 09/15/20 12/18/20  Salley Scarlet, MD  traZODone (DESYREL) 100 MG tablet Take 2 tablets (200 mg total) by mouth at bedtime as needed for sleep. 09/15/20 12/18/20  Salley Scarlet, MD    Allergies Haldol [haloperidol lactate], Ultram [tramadol], and Ziprasidone hcl  Family History  Problem Relation Age of Onset  . Hypertension Mother   . Hyperlipidemia Mother   . Asthma Mother   . Parkinson's disease Father   . Lung cancer Maternal Grandmother   . Other Paternal Grandfather 81       Cardiac arrest  . Heart disease Paternal Grandfather   . Tourette syndrome Daughter     Social History Social History   Tobacco Use  . Smoking  status: Current Every Day Smoker    Packs/day: 1.00    Years: 9.00    Pack years: 9.00    Types: E-cigarettes  . Smokeless tobacco: Never Used  Vaping Use  . Vaping Use: Never used  Substance Use Topics  . Alcohol use: Yes    Alcohol/week: 4.0 standard drinks    Types: 4 Cans of beer per week    Comment: Ocassional  . Drug use: Yes    Types: Marijuana    Comment: percocet -last use over 1 week ago. Heroin-last used 2/11    Review of  Systems Constitutional: No fever/chills Eyes: No visual changes. ENT: No sore throat. Cardiovascular: Denies chest pain. Respiratory: Denies shortness of breath. Gastrointestinal: No abdominal pain.  No nausea, no vomiting.  No diarrhea.   Genitourinary: Negative for dysuria. Musculoskeletal: Positive for left upper back pain.  Skin: Negative for rash. Neurological: Negative for headaches, focal weakness or numbness.  ____________________________________________   PHYSICAL EXAM:  VITAL SIGNS: ED Triage Vitals  Enc Vitals Group     BP 03/12/21 1833 102/79     Pulse Rate 03/12/21 1833 84     Resp 03/12/21 1833 18     Temp 03/12/21 1833 98 F (36.7 C)     Temp Source 03/12/21 1833 Oral     SpO2 03/12/21 1833 98 %     Weight 03/12/21 1834 105 lb (47.6 kg)     Height 03/12/21 1834 5\' 1"  (1.549 m)     Head Circumference --      Peak Flow --      Pain Score 03/12/21 1834 7   Constitutional: Alert and oriented.  Eyes: Conjunctivae are normal.  ENT      Head: Normocephalic and atraumatic.      Nose: No congestion/rhinnorhea.      Mouth/Throat: Mucous membranes are moist.      Neck: No stridor. Hematological/Lymphatic/Immunilogical: No cervical lymphadenopathy. Cardiovascular: Normal rate, regular rhythm.  No murmurs, rubs, or gallops.  Respiratory: Normal respiratory effort without tachypnea nor retractions. Breath sounds are clear and equal bilaterally. No wheezes/rales/rhonchi. Gastrointestinal: Soft and non tender. No rebound. No guarding.  Genitourinary: Deferred Musculoskeletal: Normal range of motion in all extremities. No lower extremity edema. Neurologic:  Normal speech and language. No gross focal neurologic deficits are appreciated.  Skin:  Skin is warm, dry and intact. No rash noted. Psychiatric: Somewhat flat affect.   ____________________________________________    LABS (pertinent positives/negatives)  Upreg neg Ethanol <10 CBC wbc 10.8, hgb 14.9, plt  285 CMP wnl except co2 21, glu 124, ast 8 UDS positive for cocaine and cannabinoid UA hazy, 0-5 rbc and wbc, rare bacteria, squamous 11-20 ____________________________________________   EKG  None  ____________________________________________    RADIOLOGY  None  ____________________________________________   PROCEDURES  Procedures  ____________________________________________   INITIAL IMPRESSION / ASSESSMENT AND PLAN / ED COURSE  Pertinent labs & imaging results that were available during my care of the patient were reviewed by me and considered in my medical decision making (see chart for details).   Patient presented to the emergency department today because of concerns for self-harm.  Patient was seen by psychiatry who will plan on admission.  Patient denies any medical complaints.  The patient has been placed in psychiatric observation due to the need to provide a safe environment for the patient while obtaining psychiatric consultation and evaluation, as well as ongoing medical and medication management to treat the patient's condition.  The patient has not been  placed under full IVC at this time.   ____________________________________________   FINAL CLINICAL IMPRESSION(S) / ED DIAGNOSES  Final diagnoses:  Bipolar affective disorder, remission status unspecified ([place])     Note: This dictation was prepared with Dragon dictation. Any transcriptional errors that result from this process are unintentional     Nance Pear, MD 03/12/21 2114

## 2021-03-12 NOTE — ED Notes (Signed)
Hourly rounding performed, patient currently asleep in room. Patient has no complaints at this time. Q15 minute rounds and monitoring via [employer] to continue.

## 2021-03-12 NOTE — ED Notes (Addendum)
ED Provider (TTS) at bedside. (Patient eating a sandwich lunch box per request)

## 2021-03-12 NOTE — ED Notes (Signed)
Patient transferred from ED to [facility] room 4 after screening for contraband. Report received from [place], RN including Situation, Background, Assessment and Recommendations. Pt oriented to unit including Q15 minute rounds as well as the security cameras for their protection. Patient is alert and oriented, warm and dry in no acute distress. Patient denies SI, HI, and AVH. Pt. Encouraged to let this nurse know if needs arise.
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
## 2021-03-12 NOTE — ED Triage Notes (Signed)
Pt states she is hitting herself and cutting herself to punish herself for making bad decisions. Pt states she wants to kill herself. Pt denies HI. Pt denies having a plan or planning to act on SI. Pt calm, answering appropriately. Pt disheveled, pt states use of cocaine, marijuana, and fentanyl in the past few days. Pt denies [place] use.

## 2021-03-13 MED ORDER — ARIPIPRAZOLE 20 MG PO TABS: 20.0000 mg | ORAL_TABLET | Freq: Every day | ORAL | 1 refills | Status: DC

## 2021-03-13 MED ORDER — LITHIUM CARBONATE ER 450 MG PO TBCR: 450.0000 mg | EXTENDED_RELEASE_TABLET | Freq: Two times a day (BID) | ORAL | 1 refills | Status: DC

## 2021-03-13 MED ORDER — DIVALPROEX SODIUM 500 MG PO DR TAB: 500.0000 mg | DELAYED_RELEASE_TABLET | Freq: Two times a day (BID) | ORAL | 1 refills | Status: DC

## 2021-03-13 NOTE — ED Notes (Signed)
Called [facility] [phone] to inform receiving facility that patient had refused bed assignment. Spoke Tremaine Grady RN Admissions intake who will cancel admission. @1228 

## 2021-03-13 NOTE — ED Provider Notes (Signed)
Emergency Medicine Observation Re-evaluation Note  Autumn [name] is a 45 y.o. female, seen on rounds today.  Pt initially presented to the ED for complaints of Suicidal  Currently, the patient is calm, no acute complaints.  Physical Exam  Blood pressure 102/79, pulse 84, temperature 98 F (36.7 C), temperature source Oral, resp. rate 18, height 5\' 1"  (1.549 m), weight 47.6 kg, last menstrual period 03/02/2021, SpO2 98 %. Physical Exam General: NAD Lungs: CTAB Psych: not agitated  ED Course / MDM  EKG:    I have reviewed the labs performed to date as well as medications administered while in observation.  Recent changes in the last 24 hours include no acute events overnight.  Patient accepted for transfer to [facility] for inpatient care today  Plan  Current plan is for transfer to [facility]. Patient is not under full IVC at this time.   Carrie Mew, MD 03/13/21 [phone]

## 2021-03-13 NOTE — ED Notes (Signed)
Hourly rounding performed, patient currently asleep in room. Patient has no complaints at this time. Q15 minute rounds and monitoring via [employer] to continue.

## 2021-03-13 NOTE — ED Notes (Signed)
[facility] [phone] to inform receiving facility that patient had refused bed assignment. Spoke Kaleen Odea RN Admissions intake who will cancel admission. @1228 

## 2021-03-13 NOTE — BH Assessment (Signed)
Patient has been accepted to [facility] on today 03/13/21.  Patient assigned to [facility]. Accepting physician is [employer].  Call report to [phone].  Representative was [place].   ER Staff is aware of it:  Luann, ER Secretary  Dr.Stafford, ER MD  Rush Landmark, Patient's Nurse

## 2021-03-13 NOTE — ED Notes (Signed)
Pt given cab voucher.  

## 2021-03-13 NOTE — BH Assessment (Signed)
Adult MH  Referral information for Psychiatric Hospitalization faxed to:   [facility] ([phone]-or- [phone]),   [facility] ([phone]),   [facility] ([phone] -or- [phone]),   [facility] [phone] -or- [phone]) [phone]   [place] ([phone]---[phone]---[phone]),   [facility] ([phone] or [phone])   [facility] ([phone] or [phone]),   [place] ([phone]). 

## 2021-03-13 NOTE — ED Notes (Signed)
Pt up to restroom at this time

## 2021-03-13 NOTE — ED Notes (Signed)
Pt asleep at this time, unable to collect vitals. Will collect pt vitals once awake. 

## 2021-03-13 NOTE — ED Notes (Signed)
Pt notified that she had a room at [facility] in [place]. Pt asked how long she would be there and was told that I did not know, that she would be evaluated and treated and when appropriate she would be discharged. She then asked how would she get home and I told her I wasn't sure but I would find out and let her know. Pt asked if she had to go, that she did not want to go out of town and I told her that at this time she was voluntary and did not have to go but that we recommended it. Pt said that she was not going to go to [facility]. I told pt that Dr Weber Cooks would come talk to her about it and she seemed ok with that.  Dr. Joni Fears and Luisa Dago, secretary, notified of above.  [facility] not notified at this time pending Dr. Weber Cooks seeing pt.

## 2021-03-13 NOTE — Consult Note (Signed)
[place] [facility] Consult   Reason for Consult: Follow-up consult for 45 year old woman with a history of bipolar or schizoaffective disorder Referring Physician: Joni Fears Patient Identification: Autumn [name] MRN:  [id number] Principal Diagnosis: Bipolar I disorder, most recent episode depressed, severe without psychotic features ([place]) Diagnosis:  Principal Problem:   Bipolar I disorder, most recent episode depressed, severe without psychotic features ([place])   Total Time spent with patient: 30 minutes  Subjective:   Autumn [name] is a 45 y.o. female patient admitted with "I am better".  HPI: Patient had been pended for inpatient hospitalization and was offered a bed at [facility].  The patient is now stating she does not want to go to [facility] it is too far away.  She says that she is feeling calmer today and feels comfortable with going back home.  She says her mood is more stable.  She denies suicidal or homicidal ideation.  We talked about her statements that she had been hitting and cutting herself and she insists that she had done little or no actual harm to herself.  Last cutting was a couple weeks ago.  Hallucinations are improved.  She has tolerated medicine well.  Patient shows good insight and is agreeable to follow-up with [facility] and continued medication management  Past Psychiatric History: Long history of schizoaffective or bipolar disorder with intermittent psychotic episodes as well as substance abuse  Risk to Self:   Risk to Others:   Prior Inpatient Therapy:   Prior Outpatient Therapy:    Past Medical History:  Past Medical History:  Diagnosis Date  . Allergy    Seasonal, Ultram (seizures)  . Anemia 2005  . Bipolar 1 disorder ([place])   . Glaucoma   . Seizures ([place]) 2008    Past Surgical History:  Procedure Laterality Date  . BREAST EXCISIONAL BIOPSY Right    x 2  . CESAREAN SECTION     x 2  . LEEP  2000   Family History:  Family  History  Problem Relation Age of Onset  . Hypertension Mother   . Hyperlipidemia Mother   . Asthma Mother   . Parkinson's disease Father   . Lung cancer Maternal Grandmother   . Other Paternal Grandfather 60       Cardiac arrest  . Heart disease Paternal Grandfather   . Tourette syndrome Daughter    Family Psychiatric  History: See previous Social History:  Social History   Substance and Sexual Activity  Alcohol Use Yes  . Alcohol/week: 4.0 standard drinks  . Types: 4 Cans of beer per week   Comment: Ocassional     Social History   Substance and Sexual Activity  Drug Use Yes  . Types: Marijuana   Comment: percocet -last use over 1 week ago. Heroin-last used 2/11    Social History   Socioeconomic History  . Marital status: Legally Separated    Spouse name: Not on file  . Number of children: Not on file  . Years of education: Not on file  . Highest education level: Not on file  Occupational History  . Not on file  Tobacco Use  . Smoking status: Current Every Day Smoker    Packs/day: 1.00    Years: 9.00    Pack years: 9.00    Types: E-cigarettes  . Smokeless tobacco: Never Used  Vaping Use  . Vaping Use: Never used  Substance and Sexual Activity  . Alcohol use: Yes    Alcohol/week: 4.0  standard drinks    Types: 4 Cans of beer per week    Comment: Ocassional  . Drug use: Yes    Types: Marijuana    Comment: percocet -last use over 1 week ago. Heroin-last used 2/11  . Sexual activity: Yes    Birth control/protection: None  Other Topics Concern  . Not on file  Social History Narrative  . Not on file   Social Determinants of Health   Financial Resource Strain: Not on file  Food Insecurity: Not on file  Transportation Needs: Not on file  Physical Activity: Not on file  Stress: Not on file  Social Connections: Not on file   Additional Social History:    Allergies:   Allergies  Allergen Reactions  . Haldol [Haloperidol Lactate]     Patient states  that she gets stiff muscles when taking Haldol  . Ultram [Tramadol] Other (See Comments)    Seizures  . Ziprasidone Hcl     Other reaction(s): Other (See Comments) PER PT WHEN SHE TAKES THIS SHE CANT MOVE HER NECK    Labs:  Results for orders placed or performed during the hospital encounter of 03/12/21 (from the past 48 hour(s))  Urine Drug Screen, Qualitative     Status: Abnormal   Collection Time: 03/12/21  6:30 PM  Result Value Ref Range   Tricyclic, Ur Screen NONE DETECTED NONE DETECTED   Amphetamines, Ur Screen NONE DETECTED NONE DETECTED   MDMA (Ecstasy)Ur Screen NONE DETECTED NONE DETECTED   Cocaine Metabolite,Ur [place] POSITIVE (A) NONE DETECTED   Opiate, Ur Screen NONE DETECTED NONE DETECTED   Phencyclidine (PCP) Ur S NONE DETECTED NONE DETECTED   Cannabinoid 50 Ng, Ur [place] POSITIVE (A) NONE DETECTED   Barbiturates, Ur Screen NONE DETECTED NONE DETECTED   Benzodiazepine, Ur Scrn NONE DETECTED NONE DETECTED   Methadone Scn, Ur NONE DETECTED NONE DETECTED    Comment: (NOTE) Tricyclics + metabolites, urine    Cutoff 1000 ng/mL Amphetamines + metabolites, urine  Cutoff 1000 ng/mL MDMA (Ecstasy), urine              Cutoff 500 ng/mL Cocaine Metabolite, urine          Cutoff 300 ng/mL Opiate + metabolites, urine        Cutoff 300 ng/mL Phencyclidine (PCP), urine         Cutoff 25 ng/mL Cannabinoid, urine                 Cutoff 50 ng/mL Barbiturates + metabolites, urine  Cutoff 200 ng/mL Benzodiazepine, urine              Cutoff 200 ng/mL Methadone, urine                   Cutoff 300 ng/mL  The urine drug screen provides only a preliminary, unconfirmed analytical test result and should not be used for non-medical purposes. Clinical consideration and professional judgment should be applied to any positive drug screen result due to possible interfering substances. A more specific alternate chemical method must be used in order to obtain a confirmed analytical result. Gas  chromatography / mass spectrometry (GC/MS) is the preferred confirm atory method. Performed at [facility], [address] [place], [place] [address]   Urinalysis, Complete w Microscopic     Status: Abnormal   Collection Time: 03/12/21  6:30 PM  Result Value Ref Range   Color, Urine YELLOW (A) YELLOW   APPearance HAZY (A) CLEAR   Specific  Gravity, Urine 1.019 1.005 - 1.030   pH 5.0 5.0 - 8.0   Glucose, UA NEGATIVE NEGATIVE mg/dL   Hgb urine dipstick NEGATIVE NEGATIVE   Bilirubin Urine NEGATIVE NEGATIVE   Ketones, ur NEGATIVE NEGATIVE mg/dL   Protein, ur NEGATIVE NEGATIVE mg/dL   Nitrite NEGATIVE NEGATIVE   Leukocytes,Ua NEGATIVE NEGATIVE   RBC / HPF 0-5 0 - 5 RBC/hpf   WBC, UA 0-5 0 - 5 WBC/hpf   Bacteria, UA RARE (A) NONE SEEN   Squamous Epithelial / LPF 11-20 0 - 5   Mucus PRESENT     Comment: Performed at [facility], [address] [place], [place] [address]  Comprehensive metabolic panel     Status: Abnormal   Collection Time: 03/12/21  6:36 PM  Result Value Ref Range   Sodium 138 135 - 145 mmol/L   Potassium 3.5 3.5 - 5.1 mmol/L   Chloride 108 98 - 111 mmol/L   CO2 21 (L) 22 - 32 mmol/L   Glucose, Bld 124 (H) 70 - 99 mg/dL    Comment: Glucose reference range applies only to samples taken after fasting for at least 8 hours.   BUN 14 6 - 20 mg/dL   Creatinine, Ser 0.57 0.44 - 1.00 mg/dL   Calcium 9.4 8.9 - 10.3 mg/dL   Total Protein 6.8 6.5 - 8.1 g/dL   Albumin 4.2 3.5 - 5.0 g/dL   AST 8 (L) 15 - 41 U/L   ALT 11 0 - 44 U/L   Alkaline Phosphatase 49 38 - 126 U/L   Total Bilirubin 0.7 0.3 - 1.2 mg/dL   GFR, Estimated >60 >60 mL/min    Comment: (NOTE) Calculated using the CKD-EPI Creatinine Equation (2021)    Anion gap 9 5 - 15    Comment: Performed at [facility], [address] [place], [place] [address]  Ethanol     Status: None   Collection Time: 03/12/21  6:36 PM  Result Value Ref Range   Alcohol, Ethyl (B) <10 <10 mg/dL     Comment: (NOTE) Lowest detectable limit for serum alcohol is 10 mg/dL.  For medical purposes only. Performed at [facility] [address] [place], [place] [address]   cbc     Status: Abnormal   Collection Time: 03/12/21  6:36 PM  Result Value Ref Range   WBC 10.8 (H) 4.0 - 10.5 K/uL   RBC 4.84 3.87 - 5.11 MIL/uL   Hemoglobin 14.9 12.0 - 15.0 g/dL   HCT 42.8 36.0 - 46.0 %   MCV 88.4 80.0 - 100.0 fL   MCH 30.8 26.0 - 34.0 pg   MCHC 34.8 30.0 - 36.0 g/dL   RDW 11.9 11.5 - 15.5 %   Platelets 285 150 - 400 K/uL   nRBC 0.0 0.0 - 0.2 %    Comment: Performed at [facility], [address] [place], [place] [address]  POC urine preg, ED     Status: None   Collection Time: 03/12/21  6:46 PM  Result Value Ref Range   Preg Test, Ur NEGATIVE NEGATIVE    Comment:        THE SENSITIVITY OF THIS METHODOLOGY IS >24 mIU/mL   Resp Panel by RT-PCR (Flu A&B, Covid) Nasopharyngeal Swab     Status: None   Collection Time: 03/12/21  8:43 PM   Specimen: Nasopharyngeal Swab; Nasopharyngeal(NP) swabs in vial transport medium  Result Value Ref Range   SARS Coronavirus 2 by RT PCR  NEGATIVE NEGATIVE    Comment: (NOTE) SARS-CoV-2 target nucleic acids are NOT DETECTED.  The SARS-CoV-2 RNA is generally detectable in upper respiratory specimens during the acute phase of infection. The lowest concentration of SARS-CoV-2 viral copies this assay can detect is 138 copies/mL. A negative result does not preclude SARS-Cov-2 infection and should not be used as the sole basis for treatment or other patient management decisions. A negative result may occur with  improper specimen collection/handling, submission of specimen other than nasopharyngeal swab, presence of viral mutation(s) within the areas targeted by this assay, and inadequate number of viral copies(<138 copies/mL). A negative result must be combined with clinical observations, patient history, and  epidemiological information. The expected result is Negative.  Fact Sheet for Patients:  EntrepreneurPulse.com.au  Fact Sheet for Healthcare Providers:  IncredibleEmployment.be  This test is no t yet approved or cleared by the [place] FDA and  has been authorized for detection and/or diagnosis of SARS-CoV-2 by FDA under an Emergency Use Authorization (EUA). This EUA will remain  in effect (meaning this test can be used) for the duration of the COVID-19 declaration under Section 564(b)(1) of the Act, 21 U.S.C.section 360bbb-3(b)(1), unless the authorization is terminated  or revoked sooner.       Influenza A by PCR NEGATIVE NEGATIVE   Influenza B by PCR NEGATIVE NEGATIVE    Comment: (NOTE) The Xpert Xpress SARS-CoV-2/FLU/RSV plus assay is intended as an aid in the diagnosis of influenza from Nasopharyngeal swab specimens and should not be used as a sole basis for treatment. Nasal washings and aspirates are unacceptable for Xpert Xpress SARS-CoV-2/FLU/RSV testing.  Fact Sheet for Patients: EntrepreneurPulse.com.au  Fact Sheet for Healthcare Providers: IncredibleEmployment.be  This test is not yet approved or cleared by the [place] FDA and has been authorized for detection and/or diagnosis of SARS-CoV-2 by FDA under an Emergency Use Authorization (EUA). This EUA will remain in effect (meaning this test can be used) for the duration of the COVID-19 declaration under Section 564(b)(1) of the Act, 21 U.S.C. section 360bbb-3(b)(1), unless the authorization is terminated or revoked.  Performed at [facility], [address], [place], [place] [address]     Current Facility-Administered Medications  Medication Dose Route Frequency Provider Last Rate Last Admin  . ARIPiprazole (ABILIFY) tablet 20 mg  20 mg Oral Daily Alysa Duca, [name], MD   20 mg at 03/13/21 0937  . divalproex (DEPAKOTE)  DR tablet 500 mg  500 mg Oral Q12H Taiylor Virden, [name], MD   500 mg at 03/13/21 0937  . lithium carbonate (ESKALITH) CR tablet 450 mg  450 mg Oral Q12H Jahfari Ambers T, MD   450 mg at 03/13/21 1829  . nicotine (NICODERM CQ - dosed in mg/24 hours) patch 21 mg  21 mg Transdermal Daily Pattijo Juste, [name], MD   21 mg at 03/13/21 9371   Current Outpatient Medications  Medication Sig Dispense Refill  . benztropine (COGENTIN) 1 MG tablet Take 1 tablet (1 mg total) by mouth daily. 30 tablet 0  . hydrOXYzine (ATARAX/VISTARIL) 25 MG tablet Take 1 tablet (25 mg total) by mouth 3 (three) times daily as needed for itching (itching, rash). 20 tablet 0  . ARIPiprazole (ABILIFY) 20 MG tablet TAKE 1 TABLET (20 MG TOTAL) BY MOUTH DAILY. (Patient not taking: No sig reported) 7 tablet 0  . OXcarbazepine (TRILEPTAL) 150 MG tablet Take 150 mg by mouth 2 (two) times daily. (Patient not taking: No sig reported)  Musculoskeletal: Strength & Muscle Tone: within normal limits Gait & Station: normal Patient leans: N/A            Psychiatric Specialty Exam:  Presentation  General Appearance: No data recorded Eye Contact:No data recorded Speech:No data recorded Speech Volume:No data recorded Handedness:No data recorded  Mood and Affect  Mood:No data recorded Affect:No data recorded  Thought Process  Thought Processes:No data recorded Descriptions of Associations:No data recorded Orientation:No data recorded Thought Content:No data recorded History of Schizophrenia/Schizoaffective disorder:No  Duration of Psychotic Symptoms:No data recorded Hallucinations:No data recorded Ideas of Reference:No data recorded Suicidal Thoughts:No data recorded Homicidal Thoughts:No data recorded  Sensorium  Memory:No data recorded Judgment:No data recorded Insight:No data recorded  Executive Functions  Concentration:No data recorded Attention Span:No data recorded Recall:No data recorded Fund of  Knowledge:No data recorded Language:No data recorded  Psychomotor Activity  Psychomotor Activity:No data recorded  Assets  Assets:No data recorded  Sleep  Sleep:No data recorded  Physical Exam: Physical Exam Vitals and nursing note reviewed.  Constitutional:      Appearance: Normal appearance.  HENT:     Head: Normocephalic and atraumatic.     Mouth/Throat:     Pharynx: Oropharynx is clear.  Eyes:     Pupils: Pupils are equal, round, and reactive to light.  Cardiovascular:     Rate and Rhythm: Normal rate and regular rhythm.  Pulmonary:     Effort: Pulmonary effort is normal.     Breath sounds: Normal breath sounds.  Abdominal:     General: Abdomen is flat.     Palpations: Abdomen is soft.  Musculoskeletal:        General: Normal range of motion.  Skin:    General: Skin is warm and dry.  Neurological:     General: No focal deficit present.     Mental Status: She is alert. Mental status is at baseline.  Psychiatric:        Mood and Affect: Mood normal.        Thought Content: Thought content normal.    Review of Systems  Constitutional: Negative.   HENT: Negative.   Eyes: Negative.   Respiratory: Negative.   Cardiovascular: Negative.   Gastrointestinal: Negative.   Musculoskeletal: Negative.   Skin: Negative.   Neurological: Negative.   Psychiatric/Behavioral: Negative.    Blood pressure 105/68, pulse 78, temperature 98 F (36.7 C), temperature source Oral, resp. rate 18, height 5\' 1"  (1.549 m), weight 47.6 kg, last menstrual period 03/02/2021, SpO2 98 %. Body mass index is 19.84 kg/m.  Treatment Plan Summary: Medication management and Plan Prescriptions will be provided for her medication and she is strongly encouraged to follow-up with outpatient mental health history.  Patient is aware that she can return to the emergency room if symptoms worsen or her situation changes such that she would need our assistance.  At this point she does not meet commitment  criteria and appears safe for discharge.  Case reviewed with emergency room physician.  Disposition: No evidence of imminent risk to self or others at present.   Supportive therapy provided about ongoing stressors. Discussed crisis plan, support from social network, calling 911, coming to the Emergency Department, and calling Suicide Hotline.  Alethia Berthold, MD 03/13/2021 11:16 AM

## 2021-03-30 ENCOUNTER — Emergency Department: Payer: BC Managed Care – PPO

## 2021-03-30 ENCOUNTER — Other Ambulatory Visit: Payer: Self-pay

## 2021-03-30 ENCOUNTER — Emergency Department
Admission: EM | Admit: 2021-03-30 | Discharge: 2021-03-30 | Disposition: A | Discharge status: Home / Self Care | Payer: BC Managed Care – PPO | Attending: Emergency Medicine | Admitting: Emergency Medicine

## 2021-03-30 DIAGNOSIS — R110 Nausea: Secondary | ICD-10-CM | POA: Diagnosis not present

## 2021-03-30 DIAGNOSIS — Z20822 Contact with and (suspected) exposure to covid-19: Secondary | ICD-10-CM | POA: Diagnosis not present

## 2021-03-30 DIAGNOSIS — R519 Headache, unspecified: Secondary | ICD-10-CM | POA: Diagnosis present

## 2021-03-30 DIAGNOSIS — F17210 Nicotine dependence, cigarettes, uncomplicated: Secondary | ICD-10-CM | POA: Diagnosis not present

## 2021-03-30 DIAGNOSIS — B349 Viral infection, unspecified: Secondary | ICD-10-CM | POA: Insufficient documentation

## 2021-03-30 DIAGNOSIS — Z28310 Unvaccinated for covid-19: Secondary | ICD-10-CM | POA: Insufficient documentation

## 2021-03-30 LAB — SARS CORONAVIRUS 2 (TAT 6-24 HRS): SARS Coronavirus 2: NEGATIVE

## 2021-03-30 MED ORDER — ONDANSETRON 4 MG PO TBDP
4.0000 mg | ORAL_TABLET | Freq: Once | ORAL | Status: AC
  Administered 2021-03-30: 4 mg via ORAL
  Filled 2021-03-30: qty 1

## 2021-03-30 MED ORDER — NAPROXEN 500 MG PO TABS
500.0000 mg | ORAL_TABLET | Freq: Once | ORAL | Status: AC
  Administered 2021-03-30: 500 mg via ORAL
  Filled 2021-03-30: qty 1

## 2021-03-30 MED ORDER — ACETAMINOPHEN 500 MG PO TABS
1000.0000 mg | ORAL_TABLET | Freq: Once | ORAL | Status: AC
  Administered 2021-03-30: 1000 mg via ORAL
  Filled 2021-03-30: qty 2

## 2021-03-30 NOTE — ED Provider Notes (Signed)
[facility] Emergency Department Provider Note ____________________________________________   Event Date/Time   First MD Initiated Contact with Patient 03/30/21 1320     (approximate)  I have reviewed the triage vital signs and the nursing notes.  HISTORY  Chief Complaint Headache and Nausea   HPI Autumn [name] is a 45 y.o. femalewho presents to the ED for evaluation of myalgias, headache after recent COVID exposure.  Chart review indicates history of bipolar disorder.   Patient reports being supposed to COVID-19 last week, she is not vaccinated, and she reports developing symptoms of diffuse myalgias, headache, nausea and poor p.o. intake over the past couple days.  Denies syncope, chest pain.  She does report dyspnea on exertion but denies lower extremity edema, productive cough.  Reports "just feeling crappy."  Past Medical History:  Diagnosis Date  . Allergy    Seasonal, Ultram (seizures)  . Anemia 2005  . Bipolar 1 disorder ([place])   . Glaucoma   . Seizures ([place]) 2008    Patient Active Problem List   Diagnosis Date Noted  . Fibrocystic breast changes of both breasts 12/18/2020  . Bipolar 1 disorder, manic, moderate ([place]) 08/31/2019  . Alcohol abuse 08/31/2019  . Bipolar I disorder, most recent episode (or current) manic ([place]) 06/02/2019  . Affective psychosis, bipolar () 05/25/2019  . Bipolar affective disorder, current episode manic ([place]) 05/20/2019  . Opiate abuse, continuous ([place]) 11/21/2018  . Bipolar I disorder, most recent episode depressed, severe without psychotic features ([place]) 07/05/2017  . Tobacco use disorder 06/27/2017  . 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 Opioid use disorder, severe, dependence ([place]) 06/27/2017  . Cannabis use disorder, severe, dependence ([place]) 06/27/2017  . CIN II (cervical intraepithelial neoplasia II) 03/06/2014  . History of anorexia nervosa 03/06/2014  . Hx of glaucoma 10/02/2013     Past Surgical History:  Procedure Laterality Date  . BREAST EXCISIONAL BIOPSY Right    x 2  . CESAREAN SECTION     x 2  . LEEP  2000    Prior to Admission medications   Medication Sig Start Date End Date Taking? Authorizing Provider  ARIPiprazole (ABILIFY) 20 MG tablet Take 1 tablet (20 mg total) by mouth daily. 03/14/21   Clapacs, [name], MD  benztropine (COGENTIN) 1 MG tablet Take 1 tablet (1 mg total) by mouth daily. 02/16/21 03/18/21  Duffy Bruce, MD  divalproex (DEPAKOTE) 500 MG DR tablet Take 1 tablet (500 mg total) by mouth every 12 (twelve) hours. 03/13/21   Clapacs, [name], MD  hydrOXYzine (ATARAX/VISTARIL) 25 MG tablet Take 1 tablet (25 mg total) by mouth 3 (three) times daily as needed for itching (itching, rash). 02/16/21   Duffy Bruce, MD  lithium carbonate (ESKALITH) 450 MG CR tablet Take 1 tablet (450 mg total) by mouth every 12 (twelve) hours. 03/13/21   Clapacs, [name], MD  OXcarbazepine (TRILEPTAL) 150 MG tablet Take 150 mg by mouth 2 (two) times daily. Patient not taking: No sig reported    [provider]  traZODone (DESYREL) 100 MG tablet Take 2 tablets (200 mg total) by mouth at bedtime as needed for sleep. 09/15/20 12/18/20  Salley Scarlet, MD    Allergies Haldol [haloperidol lactate], Ultram [tramadol], and Ziprasidone hcl  Family History  Problem Relation Age of Onset  . Hypertension Mother   . Hyperlipidemia Mother   . Asthma Mother   . Parkinson's disease Father   . Lung cancer Maternal Grandmother   .  Other Paternal Grandfather 71       Cardiac arrest  . Heart disease Paternal Grandfather   . Tourette syndrome Daughter     Social History Social History   Tobacco Use  . Smoking status: Current Every Day Smoker    Packs/day: 1.00    Years: 9.00    Pack years: 9.00    Types: E-cigarettes  . Smokeless tobacco: Never Used  Vaping Use  . Vaping Use: Never used  Substance Use Topics  . Alcohol use: Yes    Alcohol/week: 4.0 standard  drinks    Types: 4 Cans of beer per week    Comment: Ocassional  . Drug use: Yes    Types: Marijuana    Comment: percocet -last use over 1 week ago. Heroin-last used 2/11    Review of Systems  Constitutional: Positive for subjective fever/chills Eyes: No visual changes. ENT: No sore throat. Cardiovascular: Denies chest pain. Respiratory: Positive for shortness of breath. Gastrointestinal: No abdominal pain.   no vomiting.  No diarrhea.  No constipation. Positive for nausea Genitourinary: Negative for dysuria. Musculoskeletal: Negative for back pain.  Positive for myalgias Skin: Negative for rash. Neurological: Negative for headaches, focal weakness or numbness.  ____________________________________________   PHYSICAL EXAM:  VITAL SIGNS: Vitals:   03/30/21 1151  BP: 90/65  Pulse: 84  Resp: 16  Temp: 97.8 F (36.6 C)  SpO2: 100%     Constitutional: Alert and oriented. In no acute distress.  Laying on her side and wrapped up in a blanket.  Sits up in bed and is conversational in full sentences. Eyes: Conjunctivae are normal. PERRL. EOMI. Head: Atraumatic. Nose: No congestion/rhinnorhea. Mouth/Throat: Mucous membranes are moist.  Oropharynx non-erythematous. Neck: No stridor. No cervical spine tenderness to palpation. Cardiovascular: Normal rate, regular rhythm. Grossly normal heart sounds.  Good peripheral circulation. Respiratory: Normal respiratory effort.  No retractions. Lungs CTAB. Gastrointestinal: Soft , nondistended, nontender to palpation. No CVA tenderness. Musculoskeletal: No lower extremity tenderness nor edema.  No joint effusions. No signs of acute trauma. Neurologic:  Normal speech and language. No gross focal neurologic deficits are appreciated. No gait instability noted. Skin:  Skin is warm, dry and intact. No rash noted. Psychiatric: Mood and affect are normal. Speech and behavior are normal.  ____________________________________________    LABS (all labs ordered are listed, but only abnormal results are displayed)  Labs Reviewed  SARS CORONAVIRUS 2 (TAT 6-24 HRS)    ____________________________________________  RADIOLOGY  ED MD interpretation: 2 view CXR reviewed by me without evidence of acute cardiopulmonary pathology.  Official radiology report(s): DG Chest 2 View  Result Date: 03/30/2021 CLINICAL DATA:  Shortness of breath after recent COVID exposure. Cough. EXAM: CHEST - 2 VIEW COMPARISON:  07/16/2010 FINDINGS: The cardiomediastinal silhouette is unchanged with normal heart size. The lungs are well inflated. No airspace consolidation, edema, pleural effusion, pneumothorax is identified. No acute osseous abnormality is seen. IMPRESSION: No active cardiopulmonary disease. Electronically Signed   By: Logan Bores M.D.   On: 03/30/2021 14:41    ____________________________________________   PROCEDURES and INTERVENTIONS  Procedure(s) performed (including Critical Care):  Procedures  Medications  ondansetron (ZOFRAN-ODT) disintegrating tablet 4 mg (4 mg Oral Given 03/30/21 1351)  acetaminophen (TYLENOL) tablet 1,000 mg (1,000 mg Oral Given 03/30/21 1351)  naproxen (NAPROSYN) tablet 500 mg (500 mg Oral Given 03/30/21 1351)    ____________________________________________   MDM / ED COURSE   Unvaccinated 45 year old woman presents to the ED with flulike symptoms after recent COVID exposure,  likely due to COVID-19, and amenable to outpatient management.  Normal vitals on room air.  Exam without evidence of dehydration, distress, neurologic or vascular deficits.  She appears a little uncomfortable, but clinically well.  CXR demonstrates no infiltrates to suggest CAP, no PTX.  I suspect [id number] and she was swabbed for this.  Return precautions for the ED were discussed. ____________________________________________   FINAL CLINICAL IMPRESSION(S) / ED DIAGNOSES  Final diagnoses:  Viral syndrome  Close exposure to  COVID-19 virus     ED Discharge Orders    None       Isra Lindy   Note:  This document was prepared using Dragon voice recognition software and may include unintentional dictation errors.    Vladimir Crofts, MD 03/30/21 [id number]

## 2021-03-30 NOTE — Discharge Instructions (Addendum)
X-ray shows no pneumonia.  Considering your exposure, I suspect you have COVID-19.  Use Tylenol for pain and fevers.  Up to 1000 mg per dose, up to 4 times per day.  Do not take more than 4000 mg of Tylenol/acetaminophen within 24 hours.. Use naproxen/Aleve for anti-inflammatory pain relief. Use up to 500mg  every 12 hours. Do not take more frequently than this. Do not use other NSAIDs (ibuprofen, Advil) while taking this medication. It is safe to take Tylenol with this.   Check your MyChart results for your COVID-19 result later this afternoon.  Please stay masked and quarantine until he gets result.  If positive, please follow CDC guidance  Return to the ED with any worsening symptoms, inability to breathe.

## 2021-03-30 NOTE — ED Triage Notes (Signed)
Pt to ED via POV stating that she was exposed to [place]. Pt states that she is having headache, body aches, nausea and feels like she is having trouble breathing, pt SpO2 100% on room air and pt has a soda with her at this time.

## 2021-03-30 NOTE — ED Notes (Signed)
See triage note - pt a/o, c/o covid like symptoms. nad

## 2021-05-31 ENCOUNTER — Encounter: Payer: Self-pay | Admitting: Behavioral Health

## 2021-05-31 ENCOUNTER — Inpatient Hospital Stay
Admission: RE | Admit: 2021-05-31 | Discharge: 2021-06-06 | DRG: 885 | Disposition: A | Discharge status: Rehabilitation Facility | Payer: BC Managed Care – PPO | Source: Intra-hospital | Attending: Psychiatry | Admitting: Psychiatry

## 2021-05-31 ENCOUNTER — Other Ambulatory Visit: Payer: Self-pay

## 2021-05-31 ENCOUNTER — Encounter: Payer: Self-pay | Admitting: Emergency Medicine

## 2021-05-31 ENCOUNTER — Emergency Department
Admission: EM | Admit: 2021-05-31 | Discharge: 2021-05-31 | Disposition: A | Discharge status: Home / Self Care | Payer: BC Managed Care – PPO | Source: Home / Self Care | Attending: Emergency Medicine | Admitting: Emergency Medicine

## 2021-05-31 DIAGNOSIS — Z20822 Contact with and (suspected) exposure to covid-19: Secondary | ICD-10-CM | POA: Insufficient documentation

## 2021-05-31 DIAGNOSIS — F322 Major depressive disorder, single episode, severe without psychotic features: Secondary | ICD-10-CM | POA: Diagnosis present

## 2021-05-31 DIAGNOSIS — F1010 Alcohol abuse, uncomplicated: Secondary | ICD-10-CM | POA: Diagnosis present

## 2021-05-31 DIAGNOSIS — Z888 Allergy status to other drugs, medicaments and biological substances status: Secondary | ICD-10-CM | POA: Diagnosis not present

## 2021-05-31 DIAGNOSIS — G4700 Insomnia, unspecified: Secondary | ICD-10-CM | POA: Diagnosis present

## 2021-05-31 DIAGNOSIS — F419 Anxiety disorder, unspecified: Secondary | ICD-10-CM | POA: Diagnosis present

## 2021-05-31 DIAGNOSIS — F17290 Nicotine dependence, other tobacco product, uncomplicated: Secondary | ICD-10-CM | POA: Diagnosis present

## 2021-05-31 DIAGNOSIS — F1520 Other stimulant dependence, uncomplicated: Secondary | ICD-10-CM | POA: Diagnosis present

## 2021-05-31 DIAGNOSIS — F314 Bipolar disorder, current episode depressed, severe, without psychotic features: Secondary | ICD-10-CM | POA: Insufficient documentation

## 2021-05-31 DIAGNOSIS — F1120 Opioid dependence, uncomplicated: Secondary | ICD-10-CM | POA: Diagnosis present

## 2021-05-31 DIAGNOSIS — E876 Hypokalemia: Secondary | ICD-10-CM | POA: Diagnosis present

## 2021-05-31 DIAGNOSIS — Z79899 Other long term (current) drug therapy: Secondary | ICD-10-CM

## 2021-05-31 DIAGNOSIS — F1220 Cannabis dependence, uncomplicated: Secondary | ICD-10-CM | POA: Diagnosis present

## 2021-05-31 DIAGNOSIS — F1510 Other stimulant abuse, uncomplicated: Secondary | ICD-10-CM | POA: Diagnosis present

## 2021-05-31 DIAGNOSIS — Z635 Disruption of family by separation and divorce: Secondary | ICD-10-CM | POA: Diagnosis not present

## 2021-05-31 DIAGNOSIS — F1123 Opioid dependence with withdrawal: Secondary | ICD-10-CM | POA: Diagnosis not present

## 2021-05-31 DIAGNOSIS — F17210 Nicotine dependence, cigarettes, uncomplicated: Secondary | ICD-10-CM | POA: Diagnosis present

## 2021-05-31 DIAGNOSIS — Z885 Allergy status to narcotic agent status: Secondary | ICD-10-CM | POA: Diagnosis not present

## 2021-05-31 DIAGNOSIS — F17200 Nicotine dependence, unspecified, uncomplicated: Secondary | ICD-10-CM | POA: Diagnosis present

## 2021-05-31 DIAGNOSIS — F332 Major depressive disorder, recurrent severe without psychotic features: Secondary | ICD-10-CM | POA: Insufficient documentation

## 2021-05-31 DIAGNOSIS — R45851 Suicidal ideations: Secondary | ICD-10-CM | POA: Insufficient documentation

## 2021-05-31 LAB — URINE DRUG SCREEN, QUALITATIVE (ARMC ONLY)
Amphetamines, Ur Screen: NOT DETECTED
Barbiturates, Ur Screen: NOT DETECTED
Benzodiazepine, Ur Scrn: NOT DETECTED
Cannabinoid 50 Ng, Ur ~~LOC~~: POSITIVE — AB
Cocaine Metabolite,Ur ~~LOC~~: NOT DETECTED
MDMA (Ecstasy)Ur Screen: NOT DETECTED
Methadone Scn, Ur: NOT DETECTED
Opiate, Ur Screen: NOT DETECTED
Phencyclidine (PCP) Ur S: NOT DETECTED
Tricyclic, Ur Screen: NOT DETECTED

## 2021-05-31 LAB — COMPREHENSIVE METABOLIC PANEL
ALT: 12 U/L (ref 0–44)
AST: 9 U/L — ABNORMAL LOW (ref 15–41)
Albumin: 4.5 g/dL (ref 3.5–5.0)
Alkaline Phosphatase: 69 U/L (ref 38–126)
Anion gap: 10 (ref 5–15)
BUN: 13 mg/dL (ref 6–20)
CO2: 29 mmol/L (ref 22–32)
Calcium: 9.4 mg/dL (ref 8.9–10.3)
Chloride: 96 mmol/L — ABNORMAL LOW (ref 98–111)
Creatinine, Ser: 0.76 mg/dL (ref 0.44–1.00)
GFR, Estimated: >60 mL/min (ref >60)
Glucose, Bld: 126 mg/dL — ABNORMAL HIGH (ref 70–99)
Potassium: 3.2 mmol/L — ABNORMAL LOW (ref 3.5–5.1)
Sodium: 135 mmol/L (ref 135–145)
Total Bilirubin: 0.6 mg/dL (ref 0.3–1.2)
Total Protein: 7.5 g/dL (ref 6.5–8.1)

## 2021-05-31 LAB — CBC
HCT: 40.3 % (ref 36.0–46.0)
Hemoglobin: 14.1 g/dL (ref 12.0–15.0)
MCH: 31.3 pg (ref 26.0–34.0)
MCHC: 35 g/dL (ref 30.0–36.0)
MCV: 89.4 fL (ref 80.0–100.0)
Platelets: 203 10*3/uL (ref 150–400)
RBC: 4.51 MIL/uL (ref 3.87–5.11)
RDW: 11.6 % (ref 11.5–15.5)
WBC: 11.8 10*3/uL — ABNORMAL HIGH (ref 4.0–10.5)
nRBC: 0 % (ref 0.0–0.2)

## 2021-05-31 LAB — LITHIUM LEVEL
Lithium Lvl: 0.1 mmol/L — ABNORMAL LOW (ref 0.60–1.20)
Lithium Lvl: <0.06 mmol/L — ABNORMAL LOW (ref 0.60–1.20)

## 2021-05-31 LAB — RESP PANEL BY RT-PCR (FLU A&B, COVID) ARPGX2
Influenza A by PCR: NEGATIVE
Influenza B by PCR: NEGATIVE
SARS Coronavirus 2 by RT PCR: NEGATIVE

## 2021-05-31 LAB — VALPROIC ACID LEVEL
Valproic Acid Lvl: <10 ug/mL — ABNORMAL LOW (ref 50.0–100.0)
Valproic Acid Lvl: <10 ug/mL — ABNORMAL LOW (ref 50.0–100.0)

## 2021-05-31 LAB — SALICYLATE LEVEL: Salicylate Lvl: <7 mg/dL — ABNORMAL LOW (ref 7.0–30.0)

## 2021-05-31 LAB — ETHANOL: Alcohol, Ethyl (B): <10 mg/dL (ref <10)

## 2021-05-31 LAB — POC URINE PREG, ED: Preg Test, Ur: NEGATIVE

## 2021-05-31 LAB — ACETAMINOPHEN LEVEL: Acetaminophen (Tylenol), Serum: <10 ug/mL — ABNORMAL LOW (ref 10–30)

## 2021-05-31 MED ORDER — NICOTINE 21 MG/24HR TD PT24
21.0000 mg | MEDICATED_PATCH | Freq: Once | TRANSDERMAL | Status: DC
  Administered 2021-05-31: 21 mg via TRANSDERMAL
  Filled 2021-05-31: qty 1

## 2021-05-31 MED ORDER — ALUM & MAG HYDROXIDE-SIMETH 200-200-20 MG/5ML PO SUSP: 30.0000 mL | ORAL | Status: DC | PRN

## 2021-05-31 MED ORDER — MAGNESIUM HYDROXIDE 400 MG/5ML PO SUSP: 30.0000 mL | Freq: Every day | ORAL | Status: DC | PRN

## 2021-05-31 MED ORDER — TRAZODONE HCL 50 MG PO TABS
50.0000 mg | ORAL_TABLET | Freq: Every evening | ORAL | Status: DC | PRN
  Administered 2021-06-01 – 2021-06-05 (×5): 50 mg via ORAL
  Filled 2021-05-31 (×6): qty 1

## 2021-05-31 MED ORDER — DIVALPROEX SODIUM 500 MG PO DR TAB
500.0000 mg | DELAYED_RELEASE_TABLET | Freq: Two times a day (BID) | ORAL | Status: DC
  Administered 2021-05-31 – 2021-06-05 (×10): 500 mg via ORAL
  Filled 2021-05-31 (×10): qty 1

## 2021-05-31 MED ORDER — ACETAMINOPHEN 325 MG PO TABS: 650.0000 mg | ORAL_TABLET | Freq: Four times a day (QID) | ORAL | Status: DC | PRN

## 2021-05-31 MED ORDER — ACETAMINOPHEN 325 MG PO TABS
650.0000 mg | ORAL_TABLET | Freq: Four times a day (QID) | ORAL | Status: DC | PRN
  Administered 2021-05-31 – 2021-06-05 (×3): 650 mg via ORAL
  Filled 2021-05-31 (×3): qty 2

## 2021-05-31 MED ORDER — ARIPIPRAZOLE 5 MG PO TABS
5.0000 mg | ORAL_TABLET | Freq: Every day | ORAL | Status: DC
  Administered 2021-05-31 – 2021-06-04 (×5): 5 mg via ORAL
  Filled 2021-05-31 (×5): qty 1

## 2021-05-31 MED ORDER — NICOTINE 14 MG/24HR TD PT24
14.0000 mg | MEDICATED_PATCH | Freq: Every day | TRANSDERMAL | Status: DC
  Administered 2021-06-02 – 2021-06-06 (×5): 14 mg via TRANSDERMAL
  Filled 2021-05-31 (×5): qty 1

## 2021-05-31 MED ORDER — HYDROXYZINE HCL 25 MG PO TABS
25.0000 mg | ORAL_TABLET | Freq: Three times a day (TID) | ORAL | Status: DC | PRN
  Administered 2021-05-31 – 2021-06-05 (×8): 25 mg via ORAL
  Filled 2021-05-31 (×8): qty 1

## 2021-05-31 MED ORDER — POTASSIUM CHLORIDE CRYS ER 20 MEQ PO TBCR
40.0000 meq | EXTENDED_RELEASE_TABLET | Freq: Once | ORAL | Status: AC
  Administered 2021-05-31: 40 meq via ORAL
  Filled 2021-05-31: qty 2

## 2021-05-31 NOTE — Plan of Care (Signed)
Newly admitted. Sad, and tearful, expressing grief related to recent loss of a loved one. Expressing hopelessness and helplessness. Contracts for safety and motivated for treatment and growth.

## 2021-05-31 NOTE — ED Notes (Signed)
Pt discharged under IVC to BMU.  VS stable. Belongings will be sent with patient.  Pt calm and cooperative.

## 2021-05-31 NOTE — ED Notes (Signed)
Pt has grey shoes, white socks, blue jeans , gray underwear, black shirt, pink bra, purple watch, vape pen and black phone

## 2021-05-31 NOTE — ED Triage Notes (Signed)
Pt comes into the ED via POV c/o SI.  Pt states she has known bipolar disorder and presents tearful.  Pt states "My boyfriend just died, I have bipolar, and I feel like killing myself".

## 2021-05-31 NOTE — Progress Notes (Signed)
Patient sad and tearful but cooperative during admission assessment. Patient verbalized suicidal thoughts.Contracts for safety in the hospital. Patient denies HI and AVH. Patient informed of fall risk status, fall risk assessed "low" at this time. Patient oriented to unit/staff/room. Patient denies any questions/concerns at this time. Patient safe on unit with Q15 minute checks for safety. Skin assessment and body search done with Louanne Skye RN. No contraband found.

## 2021-05-31 NOTE — ED Provider Notes (Signed)
[facility] Emergency Department Provider Note   ____________________________________________   Event Date/Time   First MD Initiated Contact with Patient 05/31/21 [phone]     (approximate)  I have reviewed the triage vital signs and the nursing notes.   HISTORY  Chief Complaint Suicidal    HPI Autumn [name] is a 45 y.o. female with past medical history of bipolar disorder and polysubstance abuse who presents to the ED complaining of suicidal ideation.  Patient states that she snorted fentanyl last night with her boyfriend of 20 years and that he subsequently died from an overdose.  She is now having thoughts of ending her own life, reports a plan to overdose.  She states she has attempted to harm herself in the past.  She denies any medical complaints at this time, denies alcohol or drug use beyond snorting fentanyl.        Past Medical History:  Diagnosis Date   Allergy    Seasonal, Ultram (seizures)   Anemia 2005   Bipolar 1 disorder ([place])    Glaucoma    Seizures ([place]) 2008    Patient Active Problem List   Diagnosis Date Noted   Fibrocystic breast changes of both breasts 12/18/2020   Bipolar 1 disorder, manic, moderate ([place]) 08/31/2019   Alcohol abuse 08/31/2019   Bipolar I disorder, most recent episode (or current) manic ([place]) 06/02/2019   Affective psychosis, bipolar ([place]) 05/25/2019   Bipolar affective disorder, current episode manic ([place]) 05/20/2019   Opiate abuse, continuous ([place]) 11/21/2018   Bipolar I disorder, most recent episode depressed, severe without psychotic features ([place]) 07/05/2017   Tobacco use disorder 06/27/2017   Amphetamine use disorder, 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 Authorizing Provider  ARIPiprazole (ABILIFY) 20 MG tablet Take 1 tablet (20 mg total) by mouth daily. 03/14/21   Clapacs, [name], MD  benztropine (COGENTIN) 1 MG tablet Take 1 tablet (1 mg total) by mouth daily. 02/16/21 03/18/21  Duffy Bruce, MD  divalproex (DEPAKOTE) 500 MG DR tablet Take 1 tablet (500 mg total) by mouth every 12 (twelve) hours. 03/13/21   Clapacs, [name], MD  hydrOXYzine (ATARAX/VISTARIL) 25 MG tablet Take 1 tablet (25 mg total) by mouth 3 (three) times daily as needed for itching (itching, rash). 02/16/21   Duffy Bruce, MD  lithium carbonate (ESKALITH) 450 MG CR tablet Take 1 tablet (450 mg total) by mouth every 12 (twelve) hours. 03/13/21   Clapacs, [name], MD  OXcarbazepine (TRILEPTAL) 150 MG tablet Take 150 mg by mouth 2 (two) times daily. Patient not taking: No sig reported    [provider]  traZODone (DESYREL) 100 MG tablet Take 2 tablets (200 mg total) by mouth at bedtime as needed for sleep. 09/15/20 12/18/20  Salley Scarlet, MD    Allergies Haldol [haloperidol lactate], Ultram [tramadol], and Ziprasidone hcl  Family History  Problem Relation Age of Onset   Hypertension Mother    Hyperlipidemia Mother    Asthma  Mother    Parkinson's disease Father    Lung cancer Maternal Grandmother    Other Paternal Grandfather 34       Cardiac arrest   Heart disease Paternal Grandfather    Tourette syndrome Daughter     Social History Social History   Tobacco Use   Smoking status: Every Day    Packs/day: 1.00    Years: 9.00    Pack years: 9.00    Types: E-cigarettes, Cigarettes   Smokeless tobacco: Never  Vaping Use   Vaping Use: Never used  Substance Use Topics   Alcohol use: Yes    Alcohol/week: 4.0 standard drinks    Types: 4 Cans of beer  per week    Comment: Ocassional   Drug use: Yes    Types: Marijuana    Comment: percocet -last use over 1 week ago. Heroin-last used 2/11    Review of Systems  Constitutional: No fever/chills Eyes: No visual changes. ENT: No sore throat. Cardiovascular: Denies chest pain. Respiratory: Denies shortness of breath. Gastrointestinal: No abdominal pain.  No nausea, no vomiting.  No diarrhea.  No constipation. Genitourinary: Negative for dysuria. Musculoskeletal: Negative for back pain. Skin: Negative for rash. Neurological: Negative for headaches, focal weakness or numbness.  Positive for suicidal ideation.  ____________________________________________   PHYSICAL EXAM:  VITAL SIGNS: ED Triage Vitals  Enc Vitals Group     BP 05/31/21 0606 (!) 112/92     Pulse Rate 05/31/21 0606 84     Resp 05/31/21 0606 18     Temp 05/31/21 0606 97.7 F (36.5 C)     Temp Source 05/31/21 0606 Oral     SpO2 05/31/21 0606 100 %     Weight 05/31/21 0559 110 lb 0.2 oz (49.9 kg)     Height 05/31/21 0559 '5\' 2"'$  (1.575 m)     Head Circumference --      Peak Flow --      Pain Score 05/31/21 0559 0     Pain Loc --      Pain Edu? --      Excl. in [place]? --     Constitutional: Alert and oriented. Eyes: Conjunctivae are normal. Head: Atraumatic. Nose: No congestion/rhinnorhea. Mouth/Throat: Mucous membranes are moist. Neck: Normal ROM Cardiovascular: Normal rate, regular rhythm. Grossly normal heart sounds. Respiratory: Normal respiratory effort.  No retractions. Lungs CTAB. Gastrointestinal: Soft and nontender. No distention. Genitourinary: deferred Musculoskeletal: No lower extremity tenderness nor edema. Neurologic:  Normal speech and language. No gross focal neurologic deficits are appreciated. Skin:  Skin is warm, dry and intact. No rash noted. Psychiatric: Mood and affect are normal. Speech and behavior are normal.  ____________________________________________   LABS (all labs ordered  are listed, but only abnormal results are displayed)  Labs Reviewed  CBC - Abnormal; Notable for the following components:      Result Value   WBC 11.8 (*)    All other components within normal limits  RESP PANEL BY RT-PCR (FLU A&B, COVID) ARPGX2  ETHANOL  COMPREHENSIVE METABOLIC PANEL  SALICYLATE LEVEL  ACETAMINOPHEN LEVEL  URINE DRUG SCREEN, QUALITATIVE ([facility] ONLY)  LITHIUM LEVEL  VALPROIC ACID LEVEL  POC URINE PREG, ED    PROCEDURES  Procedure(s) performed (including Critical Care):  Procedures   ____________________________________________   INITIAL IMPRESSION / ASSESSMENT AND PLAN / ED COURSE      45 year old female with past medical history of bipolar disorder and polysubstance abuse presents to the ED complaining of suicidal ideation after finding that  her boyfriend had died from an overdose.  Given patient's acute SI with plan, we will place her under IVC.  She denies any medical complaints at this time.  Psychiatry evaluation is pending.  The patient has been placed in psychiatric observation due to the need to provide a safe environment for the patient while obtaining psychiatric consultation and evaluation, as well as ongoing medical and medication management to treat the patient's condition.  The patient has been placed under full IVC at this time.       ____________________________________________   FINAL CLINICAL IMPRESSION(S) / ED DIAGNOSES  Final diagnoses:  Suicidal ideation     ED Discharge Orders     None        Note:  This document was prepared using Dragon voice recognition software and may include unintentional dictation errors.    Blake Divine, MD 05/31/21 [phone]

## 2021-05-31 NOTE — Tx Team (Signed)
Initial Treatment Plan 05/31/2021 3:39 PM [name] [id number]    PATIENT STRESSORS: Financial difficulties Loss of boyfriend Traumatic event   PATIENT STRENGTHS: Ability for insight Communication skills Motivation for treatment/growth   PATIENT IDENTIFIED PROBLEMS: Depression  Grief  Suicidal ideations                 DISCHARGE CRITERIA:  Ability to meet basic life and health needs Improved stabilization in mood, thinking, and/or behavior Motivation to continue treatment in a less acute level of care Verbal commitment to aftercare and medication compliance  PRELIMINARY DISCHARGE PLAN: Outpatient therapy Return to previous living arrangement Return to previous work or school arrangements  PATIENT/FAMILY INVOLVEMENT: This treatment plan has been presented to and reviewed with the patient, Nirvana Jawor. The patient has been given the opportunity to ask questions and make suggestions.  Ronelle Nigh, RN 05/31/2021, 3:39 PM

## 2021-05-31 NOTE — BH Assessment (Addendum)
Patient is to be admitted to [facility] by Dr.  Jerre Simon .  Admission orders entered by T. Aggie Moats NP Attending Physician will be Dr. Domingo Cocking.   Patient has been assigned to room 307, by [place].   ER staff is aware of the admission: Olivia Mackie, ER Secretary   Dr. Tamala Julian, ER MD  Amy B., Patient's Nurse  Carver Fila., Patient Access.

## 2021-05-31 NOTE — BH Assessment (Addendum)
Comprehensive Clinical Assessment (CCA) Note  05/31/2021 Autumn [name] [id number]  Autumn [name] is a 45 year old female who presents to the ER due to having thoughts of ending her life. Per the patient, she woke up this morning to find her boyfriend deceased lying beside her, from an unintentional drug overdose. Patient further reports she is feeling overwhelmed, confused and doesn't feel safe being alone because she doesn't know what she would do. [facility] Peer Support Lanae Boast B.), verified what happed.  During the interview the patient was calm, cooperative and pleasant. She was able to provide appropriate answers to the questions. She denies HI and AV/H. She shared she haven't had all her medications because she didn't follow up with her outpatient appointment, but she has had some medications.  Spoke with Psych MD (Dr.Joshi) and patient recommended for inpatient treatment and accepted to [facility], pending medical clearance.    Chief Complaint:  Chief Complaint  Patient presents with   Suicidal   Visit Diagnosis: Major Depression    CCA Screening, Triage and Referral (STR)  Patient Reported Information How did you hear about [place]? Self  What Is the Reason for Your Visit/Call Today? Having thoughts of ending her life. She found her deceased boyfriend this morning.  How Long Has This Been Causing You Problems? <Week  What Do You Feel Would Help You the Most Today? Alcohol or Drug Use Treatment; Treatment for Depression or other mood problem   Have You Recently Had Any Thoughts About Hurting Yourself? Yes  Are You Planning to Commit Suicide/Harm Yourself At This time? No   Have you Recently Had Thoughts About [place]? No  Are You Planning to Harm Someone at This Time? No  Explanation: No data recorded  Have You Used Any Alcohol or Drugs in the Past 24 Hours? No  How Long Ago Did You Use Drugs or Alcohol? No data recorded What Did You Use and How Much? Crack, THC  and fentanyl   Do You Currently Have a Therapist/Psychiatrist? No  Name of Therapist/Psychiatrist: No data recorded  Have You Been Recently Discharged From Any Office Practice or Programs? No  Explanation of Discharge From Practice/Program: No data recorded    CCA Screening Triage Referral Assessment Type of Contact: Face-to-Face  Telemedicine Service Delivery:   Is this Initial or Reassessment? No data recorded Date Telepsych consult ordered in [facility]:  09/09/20  Time Telepsych consult ordered in [facility]:  2200  Location of Assessment: [facility] ED  Provider Location: [facility] ED   Collateral Involvement: [facility] Peer Support Harvey B.   Does Patient Have a Stage manager Guardian? No data recorded Name and Contact of Legal Guardian: Self  If Minor and Not Living with Parent(s), Who has Custody? n/a  Is CPS involved or ever been involved? Never  Is APS involved or ever been involved? Never   Patient Determined To Be At Risk for Harm To Self or Others Based on Review of Patient Reported Information or Presenting Complaint? Yes, for Self-Harm  Method: No data recorded Availability of Means: No data recorded Intent: No data recorded Notification Required: No data recorded Additional Information for Danger to Others Potential: No data recorded Additional Comments for Danger to Others Potential: No data recorded Are There Guns or Other Weapons in Your Home? No data recorded Types of Guns/Weapons: No data recorded Are These Weapons Safely Secured?  No data recorded Who Could Verify You Are Able To Have These Secured: No data recorded Do You Have any Outstanding Charges, Pending Court Dates, Parole/Probation? No data recorded Contacted To Inform of Risk of Harm To Self or Others: No data recorded   Does Patient Present under Involuntary Commitment? Yes  IVC Papers Initial File Date: 05/31/21   [place] of Residence: [place]   Patient Currently  Receiving the Following Services: Not Receiving Services   Determination of Need: Emergent (2 hours)   Options For Referral: Inpatient Hospitalization     CCA Biopsychosocial Patient Reported Schizophrenia/Schizoaffective Diagnosis in Past: No   Strengths: Have some insight, know how to ask for help and able to care of her basic needs.   Mental Health Symptoms Depression:   Change in energy/activity; Fatigue; Hopelessness; Increase/decrease in appetite; Sleep (too much or little); Tearfulness; Worthlessness   Duration of Depressive symptoms:  Duration of Depressive Symptoms: Greater than two weeks   Mania:   Racing thoughts   Anxiety:    Difficulty concentrating; Restlessness; Sleep; Tension; Worrying   Psychosis:   None   Duration of Psychotic symptoms:    Trauma:   Difficulty staying/falling asleep; Emotional numbing; Guilt/shame   Obsessions:   None   Compulsions:   None   Inattention:   None   Hyperactivity/Impulsivity:   None   Oppositional/Defiant Behaviors:   None   Emotional Irregularity:   None   Other Mood/Personality Symptoms:   Hopelessness    Mental Status Exam Appearance and self-care  Stature:   Average   Weight:   Average weight   Clothing:   Neat/clean; Age-appropriate   Grooming:   Normal   Cosmetic use:   None   Posture/gait:   Normal   Motor activity:   -- (Within normal range)   Sensorium  Attention:   Normal   Concentration:   Normal   Orientation:   X5   Recall/memory:   Normal   Affect and Mood  Affect:   Depressed; Full Range; Anxious   Mood:   Anxious; Hopeless; Worthless; Depressed   Relating  Eye contact:   Fleeting   Facial expression:   Depressed; Sad   Attitude toward examiner:   Cooperative   Thought and Language  Speech flow:  Clear and Coherent; Normal   Thought content:   Appropriate to Mood and Circumstances   Preoccupation:   Guilt; Suicide   Hallucinations:    None   Organization:  No data recorded  [employer] of Knowledge:   Average   Intelligence:   Average   Abstraction:   Normal   Judgement:   Fair; Impaired   Reality Testing:   Realistic; Adequate   Insight:   Poor; Fair   Decision Making:   Normal   Social Functioning  Social Maturity:   Responsible   Social Judgement:   Publishing rights manager"; Normal   Stress  Stressors:   Relationship; Grief/losses   Coping Ability:   Overwhelmed; Exhausted   Skill Deficits:   None   Supports:   Family; Friends/Service system     Religion: Religion/Spirituality Are You A Religious Person?: No  Leisure/Recreation: Leisure / Recreation Do You Have Hobbies?: No  Exercise/Diet: Exercise/Diet Do You Exercise?: No Have You Gained or Lost A Significant Amount of Weight in the Past Six Months?: No Do You Follow a Special Diet?: No Do You Have Any Trouble Sleeping?: No   CCA Employment/Education Employment/Work Situation: Employment / Work Situation Employment Situation:  Unemployed  Education: Education Is Patient Currently Attending School?: No Did You Have An Individualized Education Program (IIEP): No Did You Have Any Difficulty At School?: No Patient's Education Has Been Impacted by Current Illness: No   CCA Family/Childhood History Family and Relationship History: Family history Marital status: Single Does patient have children?: Yes How is patient's relationship with their children?: Children are not in her care  Childhood History:  Childhood History Did patient suffer any verbal/emotional/physical/sexual abuse as a child?: No Did patient suffer from severe childhood neglect?: No Has patient ever been sexually abused/assaulted/raped as an adolescent or adult?: No Was the patient ever a victim of a crime or a disaster?: No Witnessed domestic violence?: No Has patient been affected by domestic violence as an adult?: No  Child/Adolescent  Assessment:     CCA Substance Use Alcohol/Drug Use: Alcohol / Drug Use Pain Medications: See PTA Prescriptions: See PTA Over the Counter: See PTA History of alcohol / drug use?: Yes Longest period of sobriety (when/how long): Unable to quantify Substance #1 Name of Substance 1: Cannabis Substance #2 Name of Substance 2: Cocaine   ASAM's:  Six Dimensions of Multidimensional Assessment  Dimension 1:  Acute Intoxication and/or Withdrawal Potential:      Dimension 2:  Biomedical Conditions and Complications:      Dimension 3:  Emotional, Behavioral, or Cognitive Conditions and Complications:     Dimension 4:  Readiness to Change:     Dimension 5:  Relapse, Continued use, or Continued Problem Potential:     Dimension 6:  Recovery/Living Environment:     ASAM Severity Score:    ASAM Recommended Level of Treatment:     Substance use Disorder (SUD)    Recommendations for Services/Supports/Treatments:    Discharge Disposition:    DSM5 Diagnoses: Patient Active Problem List   Diagnosis Date Noted   Fibrocystic breast changes of both breasts 12/18/2020   Bipolar 1 disorder, manic, moderate ([place]) 08/31/2019   Alcohol abuse 08/31/2019   Bipolar I disorder, most recent episode (or current) manic ([place]) 06/02/2019   Affective psychosis, bipolar ([place]) 05/25/2019   Bipolar affective disorder, current episode manic ([place]) 05/20/2019   Opiate abuse, continuous ([place]) 11/21/2018   Bipolar I disorder, most recent episode depressed, severe without psychotic features ([place]) 07/05/2017   Tobacco use disorder 06/27/2017   Amphetamine use disorder, 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   Opioid use disorder, severe, dependence ([place]) 06/27/2017   Cannabis use disorder, severe, dependence ([place]) 06/27/2017   CIN II (cervical intraepithelial neoplasia II) 03/06/2014   History of anorexia nervosa 03/06/2014   Hx of glaucoma 10/02/2013    Referrals to Alternative Service(s): Referred to Alternative  Service(s):   Place:   Date:   Time:    Referred to Alternative Service(s):   Place:   Date:   Time:    Referred to Alternative Service(s):   Place:   Date:   Time:    Referred to Alternative Service(s):   Place:   Date:   Time:     Gunnar Fusi MS, LCAS, [facility], [facility] Therapeutic Triage Specialist 05/31/2021 12:07 PM

## 2021-06-01 DIAGNOSIS — F314 Bipolar disorder, current episode depressed, severe, without psychotic features: Secondary | ICD-10-CM | POA: Diagnosis not present

## 2021-06-01 LAB — LIPID PANEL
Cholesterol: 127 mg/dL (ref 0–200)
HDL: 48 mg/dL (ref >40)
LDL Cholesterol: 63 mg/dL (ref 0–99)
Total CHOL/HDL Ratio: 2.6 RATIO
Triglycerides: 80 mg/dL (ref <150)
VLDL: 16 mg/dL (ref 0–40)

## 2021-06-01 LAB — TSH: TSH: 1.076 u[IU]/mL (ref 0.350–4.500)

## 2021-06-01 LAB — PREGNANCY, URINE: Preg Test, Ur: NEGATIVE

## 2021-06-01 MED ORDER — POTASSIUM CHLORIDE CRYS ER 20 MEQ PO TBCR
40.0000 meq | EXTENDED_RELEASE_TABLET | Freq: Once | ORAL | Status: AC
  Administered 2021-06-01: 40 meq via ORAL
  Filled 2021-06-01: qty 2
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
## 2021-06-01 NOTE — Progress Notes (Signed)
Recreation Therapy Notes  INPATIENT RECREATION TR PLAN  Patient Details Name: Autumn [name] MRN: [id number] DOB: July 13, 1976 Today's Date: 06/01/2021  Rec Therapy Plan Is patient appropriate for Therapeutic Recreation?: Yes Treatment times per week: at least 3 Estimated Length of Stay: 5-7 days TR Treatment/Interventions: Group participation (Comment)  Discharge Criteria Pt will be discharged from therapy if:: Discharged Treatment plan/goals/alternatives discussed and agreed upon by:: Patient/family  Discharge Summary     Aldean Pipe 06/01/2021, 3:13 PM

## 2021-06-01 NOTE — BHH Group Notes (Signed)
LCSW Group Therapy Note   06/01/2021 2:09 PM  Type of Therapy and Topic:  Group Therapy:  Overcoming Obstacles   Participation Level:  Did Not Attend   Description of Group:    In this group patients will be encouraged to explore what they see as obstacles to their own wellness and recovery. They will be guided to discuss their thoughts, feelings, and behaviors related to these obstacles. The group will process together ways to cope with barriers, with attention given to specific choices patients can make. Each patient will be challenged to identify changes they are motivated to make in order to overcome their obstacles. This group will be process-oriented, with patients participating in exploration of their own experiences as well as giving and receiving support and challenge from other group members.   Therapeutic Goals: Patient will identify personal and current obstacles as they relate to admission. Patient will identify barriers that currently interfere with their wellness or overcoming obstacles.  Patient will identify feelings, thought process and behaviors related to these barriers. Patient will identify two changes they are willing to make to overcome these obstacles:      Summary of Patient Progress X   Therapeutic Modalities:   Cognitive Behavioral Therapy Solution Focused Therapy Motivational Interviewing Relapse Prevention Therapy  Assunta Curtis, MSW, LCSW 06/01/2021 2:09 PM

## 2021-06-01 NOTE — Plan of Care (Signed)
Patient during assessments this morning observed laying in bed in her patient room with the lights all on and her head under the covers. During our engagement she presented oddly related with an atypical interpersonal style. Her affect was constricted to flat and she appeared internally preoccupied, though unclear if RTIS. She denied HI but endorsed passive suicidal ideations. She endorsed her mood is sad and depressed. Her orientation appeared grossly intact. Her thought process was superficially linear, and vague, but logical. She denied safety concerns on the unit and endorsed ability to not harm self on the unit. She endorsed poor sleep over the night and no appetite this morning. Her speech was flat in prosody.   Patient has been complaint with medications and unit procedures thus far. Pt. Has been observed skipping breakfast d/t limited appetite, but did go to lunch and ate well. Pt. Has been able to remain safe on the unit thus far. Pt. Has primarily been isolative to room, not attending groups or activities.   Q x 15 minute observation checks in place/maintained for safety. Patient is provided with education throughout shift when appropriate and able.  Patient is given/offered medications per orders. Patient is encouraged to attend groups, participate in unit activities and continue with plan of care. Pt. Chart and plans of care reviewed. Pt. Given support and encouragement when appropriate and able.     Problem: Education: Goal: Emotional status will improve Outcome: Not Progressing Note: Pt. Presents sad and depressed with passive suicidal thoughts.   Goal: Mental status will improve Outcome: Not Progressing Note: Patient presents with a concrete and superficial thought process. Pt. Presents flat and constricted in her affect. Pt. Appears internally preoccupied. Pt. Presents with passive suicidal ideations, but with no endorsed specific plan and or intent.

## 2021-06-01 NOTE — Plan of Care (Signed)
  Problem: Education: Goal: Ability to state activities that reduce stress will improve Outcome: Progressing   Problem: Coping: Goal: Ability to identify and develop effective coping behavior will improve Outcome: Progressing   Problem: Self-Concept: Goal: Level of anxiety will decrease Outcome: Progressing Goal: Ability to modify response to factors that promote anxiety will improve Outcome: Progressing   Problem: Education: Goal: Utilization of techniques to improve thought processes will improve Outcome: Progressing Goal: Knowledge of the prescribed therapeutic regimen will improve Outcome: Progressing   Problem: Coping: Goal: Coping ability will improve Outcome: Progressing   Problem: Education: Goal: Knowledge of [facility] Education information/materials will improve Outcome: Progressing Goal: Emotional status will improve Outcome: Progressing Goal: Mental status will improve Outcome: Progressing Goal: Verbalization of understanding the information provided will improve Outcome: Progressing

## 2021-06-01 NOTE — Tx Team (Addendum)
Interdisciplinary Treatment and Diagnostic Plan Update  06/01/2021 Time of Session: 0900 [name] MRN: [id number]  Principal Diagnosis: <principal problem not specified>  Secondary Diagnoses: Active Problems:   MDD (major depressive disorder), recurrent severe, without psychosis ([place])   MDD (major depressive disorder), recurrent episode, severe ([place])   Current Medications:  Current Facility-Administered Medications  Medication Dose Route Frequency Provider Last Rate Last Admin   acetaminophen (TYLENOL) tablet 650 mg  650 mg Oral Q6H PRN Derrill Center, NP   650 mg at 05/31/21 1546   alum & mag hydroxide-simeth (MAALOX/MYLANTA) 200-200-20 MG/5ML suspension 30 mL  30 mL Oral Q4H PRN Derrill Center, NP       ARIPiprazole (ABILIFY) tablet 5 mg  5 mg Oral Daily Derrill Center, NP   5 mg at 06/01/21 0757   divalproex (DEPAKOTE) DR tablet 500 mg  500 mg Oral Q12H Derrill Center, NP   500 mg at 06/01/21 0757   hydrOXYzine (ATARAX/VISTARIL) tablet 25 mg  25 mg Oral TID PRN Derrill Center, NP   25 mg at 05/31/21 1546   magnesium hydroxide (MILK OF MAGNESIA) suspension 30 mL  30 mL Oral Daily PRN Derrill Center, NP       nicotine (NICODERM CQ - dosed in mg/24 hours) patch 14 mg  14 mg Transdermal Daily Salley Scarlet, MD       traZODone (DESYREL) tablet 50 mg  50 mg Oral QHS PRN Derrill Center, NP       PTA Medications: Medications Prior to Admission  Medication Sig Dispense Refill Last Dose   ARIPiprazole (ABILIFY) 20 MG tablet Take 1 tablet (20 mg total) by mouth daily. 30 tablet 1    benztropine (COGENTIN) 1 MG tablet Take 1 tablet (1 mg total) by mouth daily. 30 tablet 0    divalproex (DEPAKOTE) 500 MG DR tablet Take 1 tablet (500 mg total) by mouth every 12 (twelve) hours. 60 tablet 1    hydrOXYzine (ATARAX/VISTARIL) 25 MG tablet Take 1 tablet (25 mg total) by mouth 3 (three) times daily as needed for itching (itching, rash). 20 tablet 0    lithium carbonate (ESKALITH) 450 MG  CR tablet Take 1 tablet (450 mg total) by mouth every 12 (twelve) hours. 60 tablet 1    OXcarbazepine (TRILEPTAL) 150 MG tablet Take 150 mg by mouth 2 (two) times daily. (Patient not taking: No sig reported)       Patient Stressors: Financial difficulties Loss of boyfriend Traumatic event  Patient Strengths: Ability for Science writer Motivation for treatment/growth  Treatment Modalities: Medication Management, Group therapy, Case management,  1 to 1 session with clinician, Psychoeducation, Recreational therapy.   Physician Treatment Plan for Primary Diagnosis: <principal problem not specified> Long Term Goal(s):     Short Term Goals:    Medication Management: Evaluate patient's response, side effects, and tolerance of medication regimen.  Therapeutic Interventions: 1 to 1 sessions, Unit Group sessions and Medication administration.  Evaluation of Outcomes: Not Met  Physician Treatment Plan for Secondary Diagnosis: Active Problems:   MDD (major depressive disorder), recurrent severe, without psychosis ([place])   MDD (major depressive disorder), recurrent episode, severe ([place])  Long Term Goal(s):     Short Term Goals:       Medication Management: Evaluate patient's response, side effects, and tolerance of medication regimen.  Therapeutic Interventions: 1 to 1 sessions, Unit Group sessions and Medication administration.  Evaluation of Outcomes: Not Met   RN Treatment Plan for  Primary Diagnosis: <principal problem not specified> Long Term Goal(s): Knowledge of disease and therapeutic regimen to maintain health will improve  Short Term Goals: Ability to remain free from injury will improve, Ability to verbalize frustration and anger appropriately will improve, Ability to demonstrate self-control, Ability to participate in decision making will improve, Ability to verbalize feelings will improve, Ability to disclose and discuss suicidal ideas, Ability to identify and  develop effective coping behaviors will improve, and Compliance with prescribed medications will improve  Medication Management: RN will administer medications as ordered by provider, will assess and evaluate patient's response and provide education to patient for prescribed medication. RN will report any adverse and/or side effects to prescribing provider.  Therapeutic Interventions: 1 on 1 counseling sessions, Psychoeducation, Medication administration, Evaluate responses to treatment, Monitor vital signs and CBGs as ordered, Perform/monitor CIWA, COWS, AIMS and Fall Risk screenings as ordered, Perform wound care treatments as ordered.  Evaluation of Outcomes: Not Met   LCSW Treatment Plan for Primary Diagnosis: <principal problem not specified> Long Term Goal(s): Safe transition to appropriate next level of care at discharge, Engage patient in therapeutic group addressing interpersonal concerns.  Short Term Goals: Engage patient in aftercare planning with referrals and resources, Increase social support, Increase ability to appropriately verbalize feelings, Increase emotional regulation, Facilitate acceptance of mental health diagnosis and concerns, Facilitate patient progression through stages of change regarding substance use diagnoses and concerns, Identify triggers associated with mental health/substance abuse issues, and Increase skills for wellness and recovery  Therapeutic Interventions: Assess for all discharge needs, 1 to 1 time with Social worker, Explore available resources and support systems, Assess for adequacy in community support network, Educate family and significant other(s) on suicide prevention, Complete Psychosocial Assessment, Interpersonal group therapy.  Evaluation of Outcomes: Not Met   Progress in Treatment: Attending groups: No. Participating in groups: No. Taking medication as prescribed: Yes. Toleration medication: Yes. Family/Significant other contact made:  No, will contact:  CSW will obtain consent to reach collateral.  Patient understands diagnosis: Yes. Discussing patient identified problems/goals with staff: Yes. Medical problems stabilized or resolved: Yes. Denies suicidal/homicidal ideation: Yes. Issues/concerns per patient self-inventory: Yes. Other: none   New problem(s) identified: No, Describe:  No additional concerns expressed.   New Short Term/Long Term Goal(s): detox, elimination of symptoms of psychosis, medication management for mood stabilization; elimination of SI thoughts; development of comprehensive mental wellness/sobriety plan.  Patient Goals: "Try to go to groups . . . Worried I am going to use again" Endorsed desire to attend residential SUD treatment.   Discharge Plan or Barriers: No barriers foreseen at this time.   Reason for Continuation of Hospitalization: Anxiety Depression Suicidal ideation  Estimated Length of Stay: 1-7 days   Recreational Therapy: Patient Stressors: Death  Patient Goal: Patient will engage in groups without prompting or encouragement from LRT x3 group sessions within 5 recreation therapy group sessions  Attendees: Patient: Autumn [name] 06/01/2021 9:38 AM  Physician:  06/01/2021 9:38 AM  Nursing: Tyler Pita, RN  06/01/2021 9:38 AM  RN Care Manager: 06/01/2021 9:38 AM  Social Worker: Paulla Dolly, MSW, [place], [place]  06/01/2021 9:38 AM  Recreational Therapist:  06/01/2021 9:38 AM  Other: Waldon Merl, NP 06/01/2021 9:38 AM  Other: Assunta Curtis, MSW, LCSW 06/01/2021 9:38 AM  Other: Kiva [place], [place] 06/01/2021 9:38 AM    Scribe for Treatment Team: Durenda Hurt, [place] 06/01/2021 9:38 AM

## 2021-06-01 NOTE — BHH Suicide Risk Assessment (Signed)
[facility] Admission Suicide Risk Assessment   Nursing information obtained from:  Patient (Simultaneous filing. User may not have seen previous data.) Demographic factors:  Unemployed, Caucasian (Simultaneous filing. User may not have seen previous data.) Current Mental Status:  NA (Simultaneous filing. User may not have seen previous data.) Loss Factors:  Loss of significant relationship (Simultaneous filing. User may not have seen previous data.) Historical Factors:  NA (Simultaneous filing. User may not have seen previous data.) Risk Reduction Factors:  Religious beliefs about death (Simultaneous filing. User may not have seen previous data.)  Total Time spent with patient: 1 hour Principal Problem: <principal problem not specified> Diagnosis:  Active Problems:   MDD (major depressive disorder), recurrent severe, without psychosis ([place])   MDD (major depressive disorder), recurrent episode, severe ([place])  Subjective Data: 45 year old female presenting with suicidal ideations with plan to overdose after waking up to find her boyfriend deceased next to her from unintentional drug overdose. See H&P for details.   Continued Clinical Symptoms:  Alcohol Use Disorder Identification Test Final Score (AUDIT): 1 The "Alcohol Use Disorders Identification Test", Guidelines for Use in Primary Care, Second Edition.  World Pharmacologist [facility]). Score between 0-7:  no or low risk or alcohol related problems. Score between 8-15:  moderate risk of alcohol related problems. Score between 16-19:  high risk of alcohol related problems. Score 20 or above:  warrants further diagnostic evaluation for alcohol dependence and treatment.   CLINICAL FACTORS:   Severe Anxiety and/or Agitation Bipolar Disorder:   Depressive phase Alcohol/Substance Abuse/Dependencies More than one psychiatric diagnosis Unstable or Poor Therapeutic Relationship Previous Psychiatric Diagnoses and  Treatments   Musculoskeletal: Strength & Muscle Tone: within normal limits Gait & Station: normal Patient leans: N/A  Psychiatric Specialty Exam:  Presentation  General Appearance: Disheveled  Eye Contact:Fleeting  Speech:Normal Rate  Speech Volume:Decreased  Handedness:Right   Mood and Affect  Mood:Depressed; Hopeless  Affect:Congruent   Thought Process  Thought Processes:Coherent  Descriptions of Associations:Intact  Orientation:Full (Time, Place and Person)  Thought Content:Rumination  History of Schizophrenia/Schizoaffective disorder:No  Duration of Psychotic Symptoms:No data recorded Hallucinations:Hallucinations: None  Ideas of Reference:None  Suicidal Thoughts:Suicidal Thoughts: Yes, Active SI Active Intent and/or Plan: With Intent; With Plan  Homicidal Thoughts:Homicidal Thoughts: No   Sensorium  Memory:Immediate Fair; Recent Fair; Remote Fair  Judgment:Impaired  Insight:Present   Executive Functions  Concentration:Fair  Attention Span:Fair  [place]   Psychomotor Activity  Psychomotor Activity:Psychomotor Activity: Decreased   Assets  Assets:Communication Skills; Financial Resources/Insurance; Housing   Sleep  Sleep:Sleep: Fair Number of Hours of Sleep: 7.5    Physical Exam: Physical Exam ROS Blood pressure 103/66, pulse 69, temperature 97.9 F (36.6 C), temperature source Oral, resp. rate 18, height '5\' 2"'$  (1.575 m), weight 46.7 kg, SpO2 98 %. Body mass index is 18.84 kg/m.   COGNITIVE FEATURES THAT CONTRIBUTE TO RISK:  Loss of executive function and Polarized thinking    SUICIDE RISK:   Moderate:  Frequent suicidal ideation with limited intensity, and duration, some specificity in terms of plans, no associated intent, good self-control, limited dysphoria/symptomatology, some risk factors present, and identifiable protective factors, including available and accessible  social support.  PLAN OF CARE: Continue inpatient admission, see H&P for details.   I certify that inpatient services furnished can reasonably be expected to improve the patient's condition.   Salley Scarlet, MD 06/01/2021, 12:46 PM

## 2021-06-01 NOTE — H&P (Signed)
Psychiatric Admission Assessment Adult  Patient Identification: Autumn [name] MRN:  [id number] Date of Evaluation:  06/01/2021 Chief Complaint:  MDD (major depressive disorder), recurrent severe, without psychosis ([place]) [F33.2] MDD (major depressive disorder), recurrent episode, severe ([place]) [F33.2] Principal Diagnosis: Bipolar I disorder, most recent episode depressed, severe without psychotic features ([place]) Diagnosis:  Principal Problem:   Bipolar I disorder, most recent episode depressed, severe without psychotic features ([place]) Active Problems:   Tobacco use disorder   Amphetamine use disorder, severe ([place])   Opioid use disorder, severe, dependence ([place])   Cannabis use disorder, severe, dependence ([place])   Alcohol abuse  CC: " I do not want what happened to him to happen to me." History of Present Illness: Autumn [name] is a 45 year old with a history of bipolar 1 disorder, major depressive disorder, and substance misuse who was admitted for suicidal ideation subsequent to finding her boyfriend deceased from a drug overdose. Patient was seen and interviewed today.  Patient is understandably very depressed and slow to engage.  Patient states that she does not have any current suicidal ideations and would like to go to rehab because she does not want to to overdose and die like her boyfriend did.  She states that she has had multiple trials of medications and does not feel like anything has really helped.  Discussed with patient that using medications with illicit drugs can certainly have something to do with why she is not feeling better.  Patient agrees.  Patient states she is generally feeling sad and unwell, due to "withdrawal from fentanyl."  She is polite and cooperative.  Patient states that she feels safe on the unit and feels that she will be able to alert staff to feelings of being unsafe.  Patient denies homicidal ideations, paranoia, auditory or visual hallucinations.  Patient  verbalizes that she really wants to learn to "not use."  Patient reports that her appetite is poor and her sleep is just "okay."  No behavioral problems overnight.  Patient is taking her medications as scheduled.  depression Symptoms:  depressed mood, anhedonia, insomnia, psychomotor retardation, fatigue, feelings of worthlessness/guilt, hopelessness, Duration of Depression Symptoms: Greater than two weeks  (Hypo) Manic Symptoms:   Denies, none observed Anxiety Symptoms:  Excessive Worry, Psychotic Symptoms:   Denies, none observed PTSD Symptoms: Death of long-time boyfriend Total Time spent with patient: 1 hour  Past Psychiatric History: Bipolar 1 disorder.  At least 2 hospitalizations prior to this one   Is the patient at risk to self? Yes.    Has the patient been a risk to self in the past 6 months? Yes.    Has the patient been a risk to self within the distant past? Yes.    Is the patient a risk to others? No.  Has the patient been a risk to others in the past 6 months? No.  Has the patient been a risk to others within the distant past? No.   Prior Inpatient Therapy:   Prior Outpatient Therapy:    Alcohol Screening: 1. How often do you have a drink containing alcohol?: Monthly or less 2. How many drinks containing alcohol do you have on a typical day when you are drinking?: 1 or 2 3. How often do you have six or more drinks on one occasion?: Never AUDIT-C Score: 1 4. How often during the last year have you found that you were not able to stop drinking once you had started?: Never 5. How often during the last year  have you failed to do what was normally expected from you because of drinking?: Never 6. How often during the last year have you needed a first drink in the morning to get yourself going after a heavy drinking session?: Never 7. How often during the last year have you had a feeling of guilt of remorse after drinking?: Never 8. How often during the last year have  you been unable to remember what happened the night before because you had been drinking?: Never 9. Have you or someone else been injured as a result of your drinking?: No 10. Has a relative or friend or a doctor or another health worker been concerned about your drinking or suggested you cut down?: No Alcohol Use Disorder Identification Test Final Score (AUDIT): 1 Substance Abuse History in the last 12 months:  Yes.   Consequences of Substance Abuse: Medical Consequences:  declining health, death Legal Consequences:  fines, jail Family Consequences:  discord Previous Psychotropic Medications: Yes  Psychological Evaluations: No  Past Medical History:  Past Medical History:  Diagnosis Date   Allergy    Seasonal, Ultram (seizures)   Anemia 2005   Bipolar 1 disorder ([place])    Glaucoma    Seizures ([place]) 2008    Past Surgical History:  Procedure Laterality Date   BREAST EXCISIONAL BIOPSY Right    x 2   CESAREAN SECTION     x 2   LEEP  2000   Family History:  Family History  Problem Relation Age of Onset   Hypertension Mother    Hyperlipidemia Mother    Asthma Mother    Parkinson's disease Father    Lung cancer Maternal Grandmother    Other Paternal Grandfather 62       Cardiac arrest   Heart disease Paternal Grandfather    Tourette syndrome Daughter    Family Psychiatric  History: Patient denies.  None reported Tobacco Screening:   Social History:  Social History   Substance and Sexual Activity  Alcohol Use Yes   Alcohol/week: 4.0 standard drinks   Types: 4 Cans of beer per week   Comment: Ocassional     Social History   Substance and Sexual Activity  Drug Use Yes   Types: Marijuana   Comment: percocet -last use over 1 week ago. Heroin-last used 2/11    Additional Social History:      Allergies:   Allergies  Allergen Reactions   Haldol [Haloperidol Lactate]     Patient states that she gets stiff muscles when taking Haldol   Ultram [Tramadol] Other (See  Comments)    Seizures   Ziprasidone Hcl     Other reaction(s): Other (See Comments) PER PT WHEN SHE TAKES THIS SHE CANT MOVE HER NECK   Lab Results:  Results for orders placed or performed during the hospital encounter of 05/31/21 (from the past 48 hour(s))  Pregnancy, urine     Status: None   Collection Time: 05/31/21  6:07 AM  Result Value Ref Range   Preg Test, Ur NEGATIVE NEGATIVE    Comment: Performed at [facility], [address] [place], [place] [address]  Lithium level     Status: Abnormal   Collection Time: 05/31/21  5:00 PM  Result Value Ref Range   Lithium Lvl <0.06 (L) 0.60 - 1.20 mmol/L    Comment: Performed at [facility], [address] [place],  [address]  Valproic acid level     Status: Abnormal   Collection Time:  05/31/21  5:00 PM  Result Value Ref Range   Valproic Acid Lvl <10 (L) 50.0 - 100.0 ug/mL    Comment: Performed at [facility], [address] [place], [place] [address]  Lipid panel     Status: None   Collection Time: 06/01/21  6:30 AM  Result Value Ref Range   Cholesterol 127 0 - 200 mg/dL   Triglycerides 80 <150 mg/dL   HDL 48 >40 mg/dL   Total CHOL/HDL Ratio 2.6 RATIO   VLDL 16 0 - 40 mg/dL   LDL Cholesterol 63 0 - 99 mg/dL    Comment:        Total Cholesterol/HDL:CHD Risk Coronary Heart Disease Risk Table                     Men   Women  1/2 Average Risk   3.4   3.3  Average Risk       5.0   4.4  2 X Average Risk   9.6   7.1  3 X Average Risk  23.4   11.0        Use the calculated Patient Ratio above and the CHD Risk Table to determine the patient's CHD Risk.        ATP III CLASSIFICATION (LDL):  <100     mg/dL   Optimal  100-129  mg/dL   Near or Above                    Optimal  130-159  mg/dL   Borderline  160-189  mg/dL   High  >190     mg/dL   Very High Performed at [facility], [address] [place], [place] [address]   TSH     Status: None   Collection Time: 06/01/21   6:30 AM  Result Value Ref Range   TSH 1.076 0.350 - 4.500 uIU/mL    Comment: Performed by a 3rd Generation assay with a functional sensitivity of <=0.01 uIU/mL. Performed at [facility], [address] [place],  [address]     Blood Alcohol level:  Lab Results  Component Value Date   [facility] <10 05/31/2021   ETH <10 [id number]    Metabolic Disorder Labs:  Lab Results  Component Value Date   HGBA1C 5.4 09/13/2020   MPG 108.28 09/13/2020   MPG 96.8 11/21/2018   No results found for: PROLACTIN Lab Results  Component Value Date   CHOL 127 06/01/2021   TRIG 80 06/01/2021   HDL 48 06/01/2021   CHOLHDL 2.6 06/01/2021   VLDL 16 06/01/2021   LDLCALC 63 06/01/2021   LDLCALC 97 09/13/2020    Current Medications: Current Facility-Administered Medications  Medication Dose Route Frequency Provider Last Rate Last Admin   acetaminophen (TYLENOL) tablet 650 mg  650 mg Oral Q6H PRN Derrill Center, NP   650 mg at 05/31/21 1546   alum & mag hydroxide-simeth (MAALOX/MYLANTA) 200-200-20 MG/5ML suspension 30 mL  30 mL Oral Q4H PRN Derrill Center, NP       ARIPiprazole (ABILIFY) tablet 5 mg  5 mg Oral Daily Derrill Center, NP   5 mg at 06/01/21 0757   divalproex (DEPAKOTE) DR tablet 500 mg  500 mg Oral Q12H Derrill Center, NP   500 mg at 06/01/21 0757   hydrOXYzine (ATARAX/VISTARIL) tablet 25 mg  25 mg Oral TID PRN Derrill Center, NP   25 mg at 05/31/21 1546  magnesium hydroxide (MILK OF MAGNESIA) suspension 30 mL  30 mL Oral Daily PRN Derrill Center, NP       nicotine (NICODERM CQ - dosed in mg/24 hours) patch 14 mg  14 mg Transdermal Daily Salley Scarlet, MD       traZODone (DESYREL) tablet 50 mg  50 mg Oral QHS PRN Derrill Center, NP       PTA Medications: Medications Prior to Admission  Medication Sig Dispense Refill Last Dose   ARIPiprazole (ABILIFY) 20 MG tablet Take 1 tablet (20 mg total) by mouth daily. 30 tablet 1    benztropine (COGENTIN) 1 MG tablet Take 1  tablet (1 mg total) by mouth daily. 30 tablet 0    divalproex (DEPAKOTE) 500 MG DR tablet Take 1 tablet (500 mg total) by mouth every 12 (twelve) hours. 60 tablet 1    hydrOXYzine (ATARAX/VISTARIL) 25 MG tablet Take 1 tablet (25 mg total) by mouth 3 (three) times daily as needed for itching (itching, rash). 20 tablet 0    lithium carbonate (ESKALITH) 450 MG CR tablet Take 1 tablet (450 mg total) by mouth every 12 (twelve) hours. 60 tablet 1    OXcarbazepine (TRILEPTAL) 150 MG tablet Take 150 mg by mouth 2 (two) times daily. (Patient not taking: No sig reported)       Musculoskeletal: Strength & Muscle Tone: within normal limits Gait & Station: normal Patient leans: N/A  Psychiatric Specialty Exam:  Presentation  General Appearance:  Disheveled Eye Contact: Fair Speech: Clear and Coherent Speech Volume: Normal Handedness: Right  Mood and Affect  Mood: Depressed Affect: Blunt; Congruent; Depressed  Thought Process  Thought Processes: Coherent Duration of Psychotic Symptoms: No data recorded Past Diagnosis of Schizophrenia or Psychoactive disorder: No  Descriptions of Associations:Intact Orientation:Full (Time, Place and Person) Thought Content:WDL Hallucinations:Hallucinations: None (Denies, none observed) Ideas of Reference:None (Denies, none observed) Suicidal Thoughts:Suicidal Thoughts: No (Denies currently) SI Active Intent and/or Plan: With Intent; With Plan Homicidal Thoughts:Homicidal Thoughts: No (Denies)  Sensorium  Memory: Immediate Good; Recent Good Judgment: Poor Insight: Fair  Community education officer  Concentration: Poor Attention Span: Poor Recall: [employer] of Knowledge: Fair Language: Fair  Psychomotor Activity  Psychomotor Activity: Psychomotor Activity: Decreased; Psychomotor Retardation  Assets  Assets: Desire for Improvement; Housing; Resilience; Social Support; Physical Health; Leisure Time  Sleep  Sleep: Sleep: Fair Number  of Hours of Sleep: 7   Physical Exam: Physical Exam Vitals and nursing note reviewed.  HENT:     Head: Normocephalic.     Nose: No congestion or rhinorrhea.  Eyes:     General:        Right eye: No discharge.        Left eye: No discharge.  Cardiovascular:     Rate and Rhythm: Normal rate.  Pulmonary:     Effort: Pulmonary effort is normal.  Musculoskeletal:        General: Normal range of motion.     Cervical back: Normal range of motion.  Neurological:     Mental Status: She is alert and oriented to person, place, and time.   Review of Systems  Musculoskeletal:        States generally achy from [facility] withdrawal  Psychiatric/Behavioral:  Positive for depression and substance abuse (History of). Negative for hallucinations, memory loss and suicidal ideas (Denies current). The patient is nervous/anxious. The patient does not have insomnia.   All other systems reviewed and are negative. Blood pressure 103/66, pulse 69, temperature 97.9  F (36.6 C), temperature source Oral, resp. rate 18, height '5\' 2"'$  (1.575 m), weight 46.7 kg, SpO2 98 %. Body mass index is 18.84 kg/m.  Treatment Plan Summary: Daily contact with patient to assess and evaluate symptoms and progress in treatment and Medication management  06/01/21: Continue with 15-minute checks.  Restart medications and observe for efficacy.  Investigate inpatient treatment for substance misuse.   Observation Level/Precautions:  15 minute checks  Laboratory:  HbAIC Lipid panel, BMP  Psychotherapy: Psychosocial groups  Medications: See MAR  Consultations: As indicated  Discharge Concerns: Safety and stability  Estimated LOS: 3 to 5 days  Other:     Bipolar disorder -Continue Depakote DR tablet 500 mg p.o. every 12 hours - Continue Abilify 5 mg p.o. daily for augmentation  Nicotine replacement/smoking cessation - Continue NicoDerm CQ transdermal patch 14 mg daily  Hypokalemia - Klor-Con CR tablet 40 mEq x 1 dose,  given 06/01/2021 (completed)  Insomnia - Continue trazodone 50 mg as needed at bedtime as needed  PRN, Other  --Continue Tylenol 650 mg po every 6 hrs prn pain --Continue MAALOX/MYLANTA 30 mL po every 4 hrs prn indigestion --Continue Milk of Magnesia 30 mL po daily prn constipation    -Continue hydroxyzine tablets 25 mg p.o. 3 times daily as needed anxiety, vomiting   Physician Treatment Plan for Primary Diagnosis: Bipolar I disorder, most recent episode depressed, severe without psychotic features ([place]) Long Term Goal(s): Improvement in symptoms so as ready for discharge  Short Term Goals: Ability to identify changes in lifestyle to reduce recurrence of condition will improve, Ability to verbalize feelings will improve, Ability to disclose and discuss suicidal ideas, Ability to demonstrate self-control will improve, Ability to identify and develop effective coping behaviors will improve, Ability to maintain clinical measurements within normal limits will improve, Compliance with prescribed medications will improve, and Ability to identify triggers associated with substance abuse/mental health issues will improve  Physician Treatment Plan for Secondary Diagnosis: Principal Problem:   Bipolar I disorder, most recent episode depressed, severe without psychotic features ([place]) Active Problems:   Tobacco use disorder   Amphetamine use disorder, severe (H[facility])   Opioid use disorder, severe, dependence ([place])   Cannabis use disorder, severe, dependence ([place])   Alcohol abuse  Long Term Goal(s): Improvement in symptoms so as ready for discharge  Short Term Goals: Ability to identify changes in lifestyle to reduce recurrence of condition will improve, Ability to verbalize feelings will improve, Ability to disclose and discuss suicidal ideas, Ability to demonstrate self-control will improve, Ability to identify and develop effective coping behaviors will improve, Ability to maintain clinical  measurements within normal limits will improve, Compliance with prescribed medications will improve, and Ability to identify triggers associated with substance abuse/mental health issues will improve  I certify that inpatient services furnished can reasonably be expected to improve the patient's condition.    Sherlon Handing, NP 7/25/20222:02 PM

## 2021-06-01 NOTE — Progress Notes (Signed)
Recreation Therapy Notes    Date: 06/01/2021  Time: 9:30 am   Location: Craft room  Behavioral response: N/A   Intervention Topic: Self-care   Discussion/Intervention: Patient did not attend group.   Clinical Observations/Feedback:  Patient did not attend group.   Ezabella Teska LRT/CTRS        Jeson Camacho 06/01/2021 10:56 AM

## 2021-06-01 NOTE — Progress Notes (Signed)
Patient has been isolative to room. Mood is sad. Affect is flat and withdrawn. Denies SI and contracts for safety.

## 2021-06-01 NOTE — Progress Notes (Signed)
Recreation Therapy Notes  INPATIENT RECREATION THERAPY ASSESSMENT  Patient Details Name: Autumn [name] MRN: [id number] DOB: Sep 23, 1976 Today's Date: 06/01/2021       Information Obtained From: Patient  Able to Participate in Assessment/Interview: Yes  Patient Presentation: Responsive  Reason for Admission (Per Patient): Active Symptoms, Suicidal Ideation  Patient Stressors: Death  Coping Skills:   Substance Abuse  Leisure Interests (2+):  Individual - TV  Frequency of Recreation/Participation: Weekly  Awareness of Community Resources:  No  Community Resources:     Current Use:    If no, Barriers?:    Expressed Interest in [employer] Information: No  County of Residence:  [place]urance under[place]  Patient Main Form of Transportation: Other (Comment) (Boyfriend)  Patient Strengths:  Caring/Thoughtful  Patient Identified Areas of Improvement:  Not using again  Patient Goal for Hospitalization:  Go to group more and have a good discharge plan  Current SI (including self-harm):  No  Current HI:  No  Current AVH: No  Staff Intervention Plan: Group Attendance, Collaborate with Interdisciplinary Treatment Team  Consent to Intern Participation: N/A  Siddalee Vanderheiden 06/01/2021, 3:11 PM

## 2021-06-02 DIAGNOSIS — F314 Bipolar disorder, current episode depressed, severe, without psychotic features: Secondary | ICD-10-CM | POA: Diagnosis not present

## 2021-06-02 LAB — BASIC METABOLIC PANEL
Anion gap: 10 (ref 5–15)
BUN: 15 mg/dL (ref 6–20)
CO2: 25 mmol/L (ref 22–32)
Calcium: 9.3 mg/dL (ref 8.9–10.3)
Chloride: 100 mmol/L (ref 98–111)
Creatinine, Ser: 0.61 mg/dL (ref 0.44–1.00)
GFR, Estimated: >60 mL/min (ref >60)
Glucose, Bld: 108 mg/dL — ABNORMAL HIGH (ref 70–99)
Potassium: 4.2 mmol/L (ref 3.5–5.1)
Sodium: 135 mmol/L (ref 135–145)

## 2021-06-02 MED ORDER — BUPRENORPHINE HCL-NALOXONE HCL 2-0.5 MG SL SUBL
2.0000 | SUBLINGUAL_TABLET | Freq: Every day | SUBLINGUAL | Status: AC
  Administered 2021-06-03: 2 via SUBLINGUAL
  Filled 2021-06-02: qty 2

## 2021-06-02 MED ORDER — BUPRENORPHINE HCL-NALOXONE HCL 8-2 MG SL SUBL
1.0000 | SUBLINGUAL_TABLET | Freq: Every day | SUBLINGUAL | Status: AC
  Administered 2021-06-02: 1 via SUBLINGUAL
  Filled 2021-06-02: qty 1

## 2021-06-02 MED ORDER — ENSURE ENLIVE PO LIQD
237.0000 mL | Freq: Three times a day (TID) | ORAL | Status: DC
  Administered 2021-06-02 – 2021-06-06 (×13): 237 mL via ORAL

## 2021-06-02 MED ORDER — BUPRENORPHINE HCL-NALOXONE HCL 2-0.5 MG SL SUBL
1.0000 | SUBLINGUAL_TABLET | Freq: Every day | SUBLINGUAL | Status: AC
Start: 2021-06-04 — End: 2021-06-04
  Administered 2021-06-04: 1 via SUBLINGUAL
  Filled 2021-06-02: qty 1

## 2021-06-02 MED ORDER — ERYTHROMYCIN 5 MG/GM OP OINT
TOPICAL_OINTMENT | Freq: Four times a day (QID) | OPHTHALMIC | Status: DC
  Administered 2021-06-02 – 2021-06-06 (×11): 1 via OPHTHALMIC
  Filled 2021-06-02 (×2): qty 1

## 2021-06-02 NOTE — Progress Notes (Signed)
Patient presents with a flat and depressed affect. Patient given support. Patient isolative to her room but did get up for snack and medication administration. Patient compliant with medications tonight per MD orders. Patient given education, and support to be active in her treatment plan. Patient being monitored Q 15 minutes for safety per unit protocol. Pt remains safe on the unit.

## 2021-06-02 NOTE — [facility] Suicide Risk Assessment (Signed)
[place] INPATIENT:  Family/Significant Other Suicide Prevention Education  Suicide Prevention Education:  Education Completed; Contractor  (name of family member/significant other) has been identified by the patient as the family member/significant other with whom the patient will be residing, and identified as the person(s) who will aid the patient in the event of a mental health crisis (suicidal ideations/suicide attempt).  With written consent from the patient, the family member/significant other has been provided the following suicide prevention education, prior to the and/or following the discharge of the patient.  The suicide prevention education provided includes the following: Suicide risk factors Suicide prevention and interventions National Suicide Hotline telephone number [facility] assessment telephone number [facility] Emergency Assistance [place] and/or Residential Mobile Crisis Unit telephone number  Request made of family/significant other to: Remove weapons (e.g., guns, rifles, knives), all items previously/currently identified as safety concern.   Remove drugs/medications (over-the-counter, prescriptions, illicit drugs), all items previously/currently identified as a safety concern.  The family member/significant other verbalizes understanding of the suicide prevention education information provided.  The family member/significant other agrees to remove the items of safety concern listed above.  Autumn [name] 06/02/2021, 2:20 PM

## 2021-06-02 NOTE — Plan of Care (Signed)
  Problem: Education: Goal: Ability to state activities that reduce stress will improve Outcome: Progressing   Problem: Coping: Goal: Ability to identify and develop effective coping behavior will improve Outcome: Progressing   Problem: Self-Concept: Goal: Level of anxiety will decrease Outcome: Progressing Goal: Ability to modify response to factors that promote anxiety will improve Outcome: Progressing   Problem: Education: Goal: Utilization of techniques to improve thought processes will improve Outcome: Progressing Goal: Knowledge of the prescribed therapeutic regimen will improve Outcome: Progressing   Problem: Coping: Goal: Coping ability will improve Outcome: Progressing   Problem: Education: Goal: Knowledge of [place] [facility] Education information/materials will improve Outcome: Progressing Goal: Emotional status will improve Outcome: Progressing Goal: Mental status will improve Outcome: Progressing Goal: Verbalization of understanding the information provided will improve Outcome: Progressing

## 2021-06-02 NOTE — Plan of Care (Signed)
Patient is often in room and has been expressing hopelessness and helplessness. Endorsing passive SI. Contracts for safety. Alert and oriented and pleasant during encounter. Appetite remains poor: ensure given per order protocol. Was encouraged to participate in group activities. Emotional support provided. Safety monitored as recommended.

## 2021-06-02 NOTE — Plan of Care (Signed)
Patient presents with and endorses anxiety  Problem: Self-Concept: Goal: Level of anxiety will decrease Outcome: Not Progressing

## 2021-06-02 NOTE — [facility] Group Notes (Signed)
LCSW Group Therapy Note     06/02/2021 2:30 PM     Type of Therapy/Topic:  Group Therapy:  Feelings about Diagnosis     Participation Level:  Did Not Attend     Description of Group:   This group will allow patients to explore their thoughts and feelings about diagnoses they have received. Patients will be guided to explore their level of understanding and acceptance of these diagnoses. Facilitator will encourage patients to process their thoughts and feelings about the reactions of others to their diagnosis and will guide patients in identifying ways to discuss their diagnosis with significant others in their lives. This group will be process-oriented, with patients participating in exploration of their own experiences, giving and receiving support, and processing challenge from other group members.        Therapeutic Goals:  1.    Patient will demonstrate understanding of diagnosis as evidenced by identifying two or more symptoms of the disorder  2.    Patient will be able to express two feelings regarding the diagnosis  3.    Patient will demonstrate their ability to communicate their needs through discussion and/or role play     Summary of Patient Progress: X  Therapeutic Modalities:   Cognitive Behavioral Therapy  Brief Therapy  Feelings Identification    Adalida Garver [place], MSW, LCSW-A  06/02/2021 2:30 PM

## 2021-06-02 NOTE — Progress Notes (Signed)
[facility] MD Progress Note  06/02/2021 10:54 AM Autumn [name]  MRN:  [id number] CC: " I am feeling a little better.  Still achy." Subjective:  Autumn [name] is a 45 year old with a history of bipolar 1 disorder, major depressive disorder, and substance misuse who was admitted for suicidal ideation subsequent to finding her boyfriend deceased from a drug overdose. Patient was seen and interviewed this morning as she was in bed.  Patient still appears quite depressed and admits to same.  She states that she does not have any thoughts of suicide.  Reiterates that she wants treatment for drug misuse.  She denies homicidal ideation, auditory or visual hallucinations.  Patient is pleasant and cooperative, reserved.  Patient complains of achiness, withdrawal.  Dr. Domingo Cocking ordered Suboxone taper to help with withdrawal symptoms.  Patient has red sclerae, indicating possible infection from having contact in eye for several weeks.  Dr. Domingo Cocking ordered erythromycin ointment for that.  Patient states that she feels safe on the unit.  No behavioral issues noted.  She is eating some.  No vomiting or diarrhea.  Sleep has been adequate.  Patient mostly isolates to her room, will come out for some meals.  Limited interaction with staff and peers.    Principal Problem: Bipolar I disorder, most recent episode depressed, severe without psychotic features ([place]) Diagnosis: Principal Problem:   Bipolar I disorder, most recent episode depressed, severe without psychotic features ([place]) Active Problems:   Tobacco use disorder   Amphetamine use disorder, severe (HCC)   Opioid use disorder, severe, dependence ([place])   Cannabis use disorder, severe, dependence ([place])   Alcohol abuse  Total Time spent with patient: 15 minutes  Past Psychiatric History:  Bipolar 1 disorder.  At least 2 hospitalizations prior to this one.  Illicit substance misuse    Past Medical History:  Past Medical History:  Diagnosis Date   Allergy     Seasonal, Ultram (seizures)   Anemia 2005   Bipolar 1 disorder (HCC)    Glaucoma    Seizures ([place]) 2008    Past Surgical History:  Procedure Laterality Date   BREAST EXCISIONAL BIOPSY Right    x 2   CESAREAN SECTION     x 2   LEEP  2000   Family History:  Family History  Problem Relation Age of Onset   Hypertension Mother    Hyperlipidemia Mother    Asthma Mother    Parkinson's disease Father    Lung cancer Maternal Grandmother    Other Paternal Grandfather 20       Cardiac arrest   Heart disease Paternal Grandfather    Tourette syndrome Daughter    Family Psychiatric  History: Patient denies.  None reported Social History:  Social History   Substance and Sexual Activity  Alcohol Use Yes   Alcohol/week: 4.0 standard drinks   Types: 4 Cans of beer per week   Comment: Ocassional     Social History   Substance and Sexual Activity  Drug Use Yes   Types: Marijuana   Comment: percocet -last use over 1 week ago. Heroin-last used 2/11    Social History   Socioeconomic History   Marital status: Legally Separated    Spouse name: Not on file   Number of children: Not on file   Years of education: Not on file   Highest education level: Not on file  Occupational History   Not on file  Tobacco Use   Smoking status: Every Day  Packs/day: 1.00    Years: 9.00    Pack years: 9.00    Types: E-cigarettes, Cigarettes   Smokeless tobacco: Never  Vaping Use   Vaping Use: Never used  Substance and Sexual Activity   Alcohol use: Yes    Alcohol/week: 4.0 standard drinks    Types: 4 Cans of beer per week    Comment: Ocassional   Drug use: Yes    Types: Marijuana    Comment: percocet -last use over 1 week ago. Heroin-last used 2/11   Sexual activity: Yes    Birth control/protection: None  Other Topics Concern   Not on file  Social History Narrative   Not on file   Social Determinants of Health   Financial Resource Strain: Not on file  Food Insecurity: Not on  file  Transportation Needs: Not on file  Physical Activity: Not on file  Stress: Not on file  Social Connections: Not on file   Additional Social History:     Sleep: Good  Appetite:  Fair  Current Medications: Current Facility-Administered Medications  Medication Dose Route Frequency Provider Last Rate Last Admin   acetaminophen (TYLENOL) tablet 650 mg  650 mg Oral Q6H PRN Derrill Center, NP   650 mg at 05/31/21 1546   alum & mag hydroxide-simeth (MAALOX/MYLANTA) 200-200-20 MG/5ML suspension 30 mL  30 mL Oral Q4H PRN Derrill Center, NP       ARIPiprazole (ABILIFY) tablet 5 mg  5 mg Oral Daily Derrill Center, NP   5 mg at 06/02/21 0807   buprenorphine-naloxone (SUBOXONE) 8-2 mg per SL tablet 1 tablet  1 tablet Sublingual Daily Salley Scarlet, MD       Followed by   Derrill Memo ON 06/03/2021] buprenorphine-naloxone (SUBOXONE) 2-0.5 mg per SL tablet 2 tablet  2 tablet Sublingual Daily Salley Scarlet, MD       Followed by   Derrill Memo ON 06/04/2021] buprenorphine-naloxone (SUBOXONE) 2-0.5 mg per SL tablet 1 tablet  1 tablet Sublingual Daily Selina Cooley M, MD       divalproex (DEPAKOTE) DR tablet 500 mg  500 mg Oral Q12H Derrill Center, NP   500 mg at 06/02/21 [id number]   erythromycin ophthalmic ointment   Left Eye Q6H Salley Scarlet, MD       feeding supplement (ENSURE ENLIVE / ENSURE PLUS) liquid 237 mL  237 mL Oral TID BM Salley Scarlet, MD       hydrOXYzine (ATARAX/VISTARIL) tablet 25 mg  25 mg Oral TID PRN Derrill Center, NP   25 mg at 05/31/21 1546   magnesium hydroxide (MILK OF MAGNESIA) suspension 30 mL  30 mL Oral Daily PRN Derrill Center, NP       nicotine (NICODERM CQ - dosed in mg/24 hours) patch 14 mg  14 mg Transdermal Daily Salley Scarlet, MD   14 mg at 06/02/21 0809   traZODone (DESYREL) tablet 50 mg  50 mg Oral QHS PRN Derrill Center, NP   50 mg at 06/01/21 2100    Lab Results:  Results for orders placed or performed during the hospital encounter of 05/31/21 (from the  past 48 hour(s))  Lithium level     Status: Abnormal   Collection Time: 05/31/21  5:00 PM  Result Value Ref Range   Lithium Lvl <0.06 (L) 0.60 - 1.20 mmol/L    Comment: Performed at [facility], [address], [place], [place] [address]  Valproic acid level  Status: Abnormal   Collection Time: 05/31/21  5:00 PM  Result Value Ref Range   Valproic Acid Lvl <10 (L) 50.0 - 100.0 ug/mL    Comment: Performed at [facility], [address] [place], [place] [address]  Lipid panel     Status: None   Collection Time: 06/01/21  6:30 AM  Result Value Ref Range   Cholesterol 127 0 - 200 mg/dL   Triglycerides 80 <150 mg/dL   HDL 48 >40 mg/dL   Total CHOL/HDL Ratio 2.6 RATIO   VLDL 16 0 - 40 mg/dL   LDL Cholesterol 63 0 - 99 mg/dL    Comment:        Total Cholesterol/HDL:CHD Risk Coronary Heart Disease Risk Table                     Men   Women  1/2 Average Risk   3.4   3.3  Average Risk       5.0   4.4  2 X Average Risk   9.6   7.1  3 X Average Risk  23.4   11.0        Use the calculated Patient Ratio above and the CHD Risk Table to determine the patient's CHD Risk.        ATP III CLASSIFICATION (LDL):  <100     mg/dL   Optimal  100-129  mg/dL   Near or Above                    Optimal  130-159  mg/dL   Borderline  160-189  mg/dL   High  >190     mg/dL   Very High Performed at [facility], [address] [place], [place] [address]   TSH     Status: None   Collection Time: 06/01/21  6:30 AM  Result Value Ref Range   TSH 1.076 0.350 - 4.500 uIU/mL    Comment: Performed by a 3rd Generation assay with a functional sensitivity of <=0.01 uIU/mL. Performed at [facility], [address] [place], [place] [id number]   Basic metabolic panel     Status: Abnormal   Collection Time: 06/02/21  7:22 AM  Result Value Ref Range   Sodium 135 135 - 145 mmol/L   Potassium 4.2 3.5 - 5.1 mmol/L   Chloride 100 98 - 111 mmol/L   CO2 25 22 - 32 mmol/L    Glucose, Bld 108 (H) 70 - 99 mg/dL    Comment: Glucose reference range applies only to samples taken after fasting for at least 8 hours.   BUN 15 6 - 20 mg/dL   Creatinine, Ser 0.61 0.44 - 1.00 mg/dL   Calcium 9.3 8.9 - 10.3 mg/dL   GFR, Estimated >60 >60 mL/min    Comment: (NOTE) Calculated using the CKD-EPI Creatinine Equation (2021)    Anion gap 10 5 - 15    Comment: Performed at [facility], [address] [place], [place] [address]    Blood Alcohol level:  Lab Results  Component Value Date   [facility] <10 05/31/2021   ETH <10 [id number]    Metabolic Disorder Labs: Lab Results  Component Value Date   HGBA1C 5.4 09/13/2020   MPG 108.28 09/13/2020   MPG 96.8 11/21/2018   No results found for: PROLACTIN Lab Results  Component Value Date   CHOL 127 06/01/2021   TRIG 80 06/01/2021   HDL 48 06/01/2021  CHOLHDL 2.6 06/01/2021   VLDL 16 06/01/2021   LDLCALC 63 06/01/2021   LDLCALC 97 09/13/2020    Physical Findings: AIMS:  , ,  ,  ,    CIWA:    COWS:     Musculoskeletal: Strength & Muscle Tone: within normal limits Gait & Station: normal Patient leans: N/A  Psychiatric Specialty Exam:  Presentation  General Appearance: Appropriate for Environment  Eye Contact:Fair  Speech:Clear and Coherent  Speech Volume:Normal  Handedness:Right   Mood and Affect  Mood:Depressed  Affect:Congruent   Thought Process  Thought Processes:Coherent  Descriptions of Associations:Intact  Orientation:Full (Time, Place and Person)  Thought Content:WDL  History of Schizophrenia/Schizoaffective disorder:No  Duration of Psychotic Symptoms:No data recorded Hallucinations:Hallucinations: -- (Denies,)  Ideas of Reference:None (Denies, none observed)  Suicidal Thoughts:Suicidal Thoughts: No (Denies) SI Active Intent and/or Plan: With Intent; With Plan  Homicidal Thoughts:Homicidal Thoughts: No (Denies)   Sensorium  Memory:Immediate Good; Recent  Good  Judgment:Poor  Insight:Fair   Executive Functions  Concentration:Poor  Attention Span:Poor  [place]   Psychomotor Activity  Psychomotor Activity:Psychomotor Activity: Decreased; Psychomotor Retardation   Assets  Assets:Desire for Improvement; Housing; Resilience; Social Support; Physical Health; Leisure Time   Sleep  Sleep:Sleep: Fair Number of Hours of Sleep: 7.5    Physical Exam: Physical Exam HENT:     Head: Normocephalic.     Nose: No congestion or rhinorrhea.  Eyes:     General:        Left eye: Discharge (Redness of conjunctiva) present. Cardiovascular:     Rate and Rhythm: Tachycardia present.     Comments: Patient in no acute distress Pulmonary:     Effort: Pulmonary effort is normal.  Musculoskeletal:        General: Normal range of motion.     Cervical back: Normal range of motion.  Skin:    General: Skin is warm and dry.  Neurological:     Mental Status: She is alert and oriented to person, place, and time.  Psychiatric:        Attention and Perception: Attention normal.        Mood and Affect: Mood is depressed.        Speech: Speech normal.        Behavior: Behavior normal.        Thought Content: Thought content normal.        Cognition and Memory: Cognition normal.        Judgment: Judgment normal.   Review of Systems  Psychiatric/Behavioral:  Positive for depression and substance abuse (History of). Negative for hallucinations, memory loss and suicidal ideas. The patient is nervous/anxious. The patient does not have insomnia.   Blood pressure 102/81, pulse (!) 128, temperature 97.8 F (36.6 C), temperature source Oral, resp. rate 17, height '5\' 2"'$  (1.575 m), weight 46.7 kg, SpO2 97 %. Body mass index is 18.84 kg/m.   Treatment Plan Summary: Daily contact with patient to assess and evaluate symptoms and progress in treatment and Medication management  06/02/2021 update: Patient was  stable overnight.  Continues to complain of withdrawal symptoms of achiness and chills.  No vomiting or diarrhea.  Alert and oriented x4.  Suboxone taper initiated.  Erythromycin ophthalmic ointment for left eye irritation/possible infection.  Bipolar disorder -Continue Depakote DR tablet 500 mg p.o. every 12 hours - Continue Abilify 5 mg p.o. daily for augmentation  Nicotine replacement/smoking cessation - Continue NicoDerm CQ transdermal patch 14 mg daily  Hypokalemia -  Klor-Con CR tablet 40 mEq x 1 dose, given 06/01/2021 (completed)  Insomnia - Continue trazodone 50 mg as needed at bedtime as needed  Opioid withdrawal -Start Suboxone taper: Suboxone 8-2 mg per SL tablet .Take 1 tablet sublingual daily x1 dose (on 7/26) Followed by Suboxone 2-0.5 mg per SL tablet.  Take 2 tablets sublingual daily x1 dose (on  7/27).  Followed by Suboxone 2-0.5 mg per SL tablet.  Take 1 tablet sublingual daily x1 dose (on 7/28).  Eye infection - Start erythromycin ophthalmic ointment.  Use in left eye every 6 hours for 5 days   PRN, Other --Continue Tylenol 650 mg po every 6 hrs prn pain --Continue MAALOX/MYLANTA 30 mL po every 4 hrs prn indigestion --Continue Milk of Magnesia 30 mL po daily prn constipation    -Continue hydroxyzine tablets 25 mg p.o. 3 times daily as needed anxiety, vomiting  Sherlon Handing, NP 06/02/2021, 10:54 AM

## 2021-06-02 NOTE — Progress Notes (Signed)
Patient requested medication as soon as they could be released. Stating she was ready for bed.  She was med compliant and received meds without incident.  She continues to endorse passive SI and depression, although she reports feeling a little better today. She continue to isolate in her room, but will come for snacks and medication. Will continue to monitor with Q 15 minute safety checks and encouraged her to seek staff with any concerns.    Cleo Butler-Nicholson, LPN

## 2021-06-03 DIAGNOSIS — F314 Bipolar disorder, current episode depressed, severe, without psychotic features: Secondary | ICD-10-CM | POA: Diagnosis not present

## 2021-06-03 LAB — HEMOGLOBIN A1C
Hgb A1c MFr Bld: 5.6 % (ref 4.8–5.6)
Mean Plasma Glucose: 114 mg/dL

## 2021-06-03 MED ORDER — LORAZEPAM 1 MG PO TABS
1.0000 mg | ORAL_TABLET | Freq: Once | ORAL | Status: AC
  Administered 2021-06-03: 1 mg via ORAL
  Filled 2021-06-03: qty 1

## 2021-06-03 NOTE — [facility] Group Notes (Signed)
LCSW Group Therapy Note  06/03/2021 2:44 PM  Type of Therapy/Topic:  Group Therapy:  Emotion Regulation  Participation Level:  Did Not Attend   Description of Group:   The purpose of this group is to assist patients in learning to regulate negative emotions and experience positive emotions. Patients will be guided to discuss ways in which they have been vulnerable to their negative emotions. These vulnerabilities will be juxtaposed with experiences of positive emotions or situations, and patients will be challenged to use positive emotions to combat negative ones. Special emphasis will be placed on coping with negative emotions in conflict situations, and patients will process healthy conflict resolution skills.  Therapeutic Goals: Patient will identify two positive emotions or experiences to reflect on in order to balance out negative emotions Patient will label two or more emotions that they find the most difficult to experience Patient will demonstrate positive conflict resolution skills through discussion and/or role plays  Summary of Patient Progress: Patient did not attend group despite encouraged participation.     Therapeutic Modalities:   Cognitive Behavioral Therapy Feelings Identification Dialectical Behavioral Therapy  Autumn [name], MSW, [place] 06/03/2021 2:44 PM

## 2021-06-03 NOTE — Progress Notes (Signed)
[facility] MD Progress Note  06/03/2021 4:09 PM [name]  MRN:  [id number] CC: " My mother is old-fashioned.  She does not believe in therapy for mental health." Subjective:  Autumn [name] is a 45 year old with a history of bipolar 1 disorder, major depressive disorder, and substance misuse who was admitted for suicidal ideation subsequent to finding her boyfriend deceased from a drug overdose.  Patient was seen and interviewed this afternoon and she was lying in bed, awake and alert.  Drapes are open so that she can see outside.  Patient appears sad, but expresses hope that things will get better.  She expresses that she does want to seek mental health treatment, including psychotherapy.  When I asked if she had spoken to anyone outside of the hospital, such as family, she states she has spoken to her mother but her mother is not very supportive of mental health services.  Patient currently denies any suicidal ideations.  Denies homicidal ideations, paranoia, auditory or visual hallucinations.  Patient has eaten very little today but maintains that she is drinking fluids and urinating.  She is taking her medications as prescribed.  Her left conjunctiva still red but appears to be improving.  Patient met with detectives this morning regarding incident that preceded admission.  She states that was difficult for her and is glad it is over.  She received Ativan as a one-time dose prior to interview and stated that it was helpful.  She also received hydroxyzine after the interview.   Ms. Luscher remains cooperative and pleasant and is exploring information that social worker gave her about substance misuse inpatient treatment.  She maintains interest in receiving inpatient treatment for substance use issues.  Principal Problem: Bipolar I disorder, most recent episode depressed, severe without psychotic features ([place]) Diagnosis: Principal Problem:   Bipolar I disorder, most recent episode depressed, severe  without psychotic features ([place]) Active Problems:   Tobacco use disorder   Amphetamine use disorder, severe (HCC)   Opioid use disorder, severe, dependence ([place])   Cannabis use disorder, severe, dependence ([place])   Alcohol abuse  Total Time spent with patient: 15 minutes  Past Psychiatric History:  Bipolar 1 disorder.  At least 2 hospitalizations prior to this one.  Illicit substance misuse    Past Medical History:  Past Medical History:  Diagnosis Date   Allergy    Seasonal, Ultram (seizures)   Anemia 2005   Bipolar 1 disorder (HCC)    Glaucoma    Seizures ([place]) 2008    Past Surgical History:  Procedure Laterality Date   BREAST EXCISIONAL BIOPSY Right    x 2   CESAREAN SECTION     x 2   LEEP  2000   Family History:  Family History  Problem Relation Age of Onset   Hypertension Mother    Hyperlipidemia Mother    Asthma Mother    Parkinson's disease Father    Lung cancer Maternal Grandmother    Other Paternal Grandfather 64       Cardiac arrest   Heart disease Paternal Grandfather    Tourette syndrome Daughter    Family Psychiatric  History: Patient denies.  None reported Social History:  Social History   Substance and Sexual Activity  Alcohol Use Yes   Alcohol/week: 4.0 standard drinks   Types: 4 Cans of beer per week   Comment: Ocassional     Social History   Substance and Sexual Activity  Drug Use Yes   Types: Marijuana  Comment: percocet -last use over 1 week ago. Heroin-last used 2/11    Social History   Socioeconomic History   Marital status: Legally Separated    Spouse name: Not on file   Number of children: Not on file   Years of education: Not on file   Highest education level: Not on file  Occupational History   Not on file  Tobacco Use   Smoking status: Every Day    Packs/day: 1.00    Years: 9.00    Pack years: 9.00    Types: E-cigarettes, Cigarettes   Smokeless tobacco: Never  Vaping Use   Vaping Use: Never used  Substance and  Sexual Activity   Alcohol use: Yes    Alcohol/week: 4.0 standard drinks    Types: 4 Cans of beer per week    Comment: Ocassional   Drug use: Yes    Types: Marijuana    Comment: percocet -last use over 1 week ago. Heroin-last used 2/11   Sexual activity: Yes    Birth control/protection: None  Other Topics Concern   Not on file  Social History Narrative   Not on file   Social Determinants of Health   Financial Resource Strain: Not on file  Food Insecurity: Not on file  Transportation Needs: Not on file  Physical Activity: Not on file  Stress: Not on file  Social Connections: Not on file   Additional Social History:     Sleep: Good  Appetite:  Fair  Current Medications: Current Facility-Administered Medications  Medication Dose Route Frequency Provider Last Rate Last Admin   acetaminophen (TYLENOL) tablet 650 mg  650 mg Oral Q6H PRN Derrill Center, NP   650 mg at 06/02/21 1652   alum & mag hydroxide-simeth (MAALOX/MYLANTA) 200-200-20 MG/5ML suspension 30 mL  30 mL Oral Q4H PRN Derrill Center, NP       ARIPiprazole (ABILIFY) tablet 5 mg  5 mg Oral Daily Derrill Center, NP   5 mg at 06/03/21 0802   [START ON 06/04/2021] buprenorphine-naloxone (SUBOXONE) 2-0.5 mg per SL tablet 1 tablet  1 tablet Sublingual Daily Salley Scarlet, MD       divalproex (DEPAKOTE) DR tablet 500 mg  500 mg Oral Q12H Derrill Center, NP   500 mg at 06/03/21 0802   erythromycin ophthalmic ointment   Left Eye Q6H Salley Scarlet, MD   1 application at 09/38/18 1339   feeding supplement (ENSURE ENLIVE / ENSURE PLUS) liquid 237 mL  237 mL Oral TID BM Salley Scarlet, MD   237 mL at 06/03/21 1000   hydrOXYzine (ATARAX/VISTARIL) tablet 25 mg  25 mg Oral TID PRN Derrill Center, NP   25 mg at 06/03/21 1305   magnesium hydroxide (MILK OF MAGNESIA) suspension 30 mL  30 mL Oral Daily PRN Derrill Center, NP       nicotine (NICODERM CQ - dosed in mg/24 hours) patch 14 mg  14 mg Transdermal Daily Salley Scarlet, MD   14 mg at 06/03/21 0805   traZODone (DESYREL) tablet 50 mg  50 mg Oral QHS PRN Derrill Center, NP   50 mg at 06/02/21 2112    Lab Results:  Results for orders placed or performed during the hospital encounter of 05/31/21 (from the past 48 hour(s))  Basic metabolic panel     Status: Abnormal   Collection Time: 06/02/21  7:22 AM  Result Value Ref Range   Sodium 135 135 - 145  mmol/L   Potassium 4.2 3.5 - 5.1 mmol/L   Chloride 100 98 - 111 mmol/L   CO2 25 22 - 32 mmol/L   Glucose, Bld 108 (H) 70 - 99 mg/dL    Comment: Glucose reference range applies only to samples taken after fasting for at least 8 hours.   BUN 15 6 - 20 mg/dL   Creatinine, Ser 0.61 0.44 - 1.00 mg/dL   Calcium 9.3 8.9 - 10.3 mg/dL   GFR, Estimated >60 >60 mL/min    Comment: (NOTE) Calculated using the CKD-EPI Creatinine Equation (2021)    Anion gap 10 5 - 15    Comment: Performed at [facility], [address] [place], [place] [address]  Hemoglobin A1c     Status: None   Collection Time: 06/02/21  7:22 AM  Result Value Ref Range   Hgb A1c MFr Bld 5.6 4.8 - 5.6 %    Comment: (NOTE)         Prediabetes: 5.7 - 6.4         Diabetes: >6.4         Glycemic control for adults with diabetes: <7.0    Mean Plasma Glucose 114 mg/dL    Comment: (NOTE) Performed At: [facility]  [address] [place], [place] [id number] Rush Farmer MD LT:[id number]     Blood Alcohol level:  Lab Results  Component Value Date   [facility] <10 05/31/2021   ETH <10 68/61/6837    Metabolic Disorder Labs: Lab Results  Component Value Date   HGBA1C 5.6 06/02/2021   MPG 114 06/02/2021   MPG 108.28 09/13/2020   No results found for: PROLACTIN Lab Results  Component Value Date   CHOL 127 06/01/2021   TRIG 80 06/01/2021   HDL 48 06/01/2021   CHOLHDL 2.6 06/01/2021   VLDL 16 06/01/2021   LDLCALC 63 06/01/2021   LDLCALC 97 09/13/2020    Physical Findings: AIMS:  , ,  ,  ,    CIWA:    COWS:      Musculoskeletal: Strength & Muscle Tone: within normal limits Gait & Station: normal Patient leans: N/A  Psychiatric Specialty Exam:  Presentation  General Appearance: Appropriate for Environment  Eye Contact:Good  Speech:Clear and Coherent  Speech Volume:Normal  Handedness:Right   Mood and Affect  Mood:Depressed  Affect:Congruent   Thought Process  Thought Processes:Coherent  Descriptions of Associations:Intact  Orientation:Full (Time, Place and Person)  Thought Content:WDL  History of Schizophrenia/Schizoaffective disorder:No  Duration of Psychotic Symptoms:No data recorded Hallucinations:Hallucinations: None (Denies, none observed)  Ideas of Reference:None (Denies)  Suicidal Thoughts:Suicidal Thoughts: No (Denies)  Homicidal Thoughts:Homicidal Thoughts: No (Denies)   Sensorium  Memory:Immediate Good; Recent Good  Judgment:Good (Exhibited in the hospital)  Insight:Fair   Executive Functions  Concentration:Poor  Attention Span:Poor  [place]   Psychomotor Activity  Psychomotor Activity:Psychomotor Activity: Decreased   Assets  Assets:Communication Skills; Desire for Improvement; Financial Resources/Insurance; Housing; Resilience; Physical Health   Sleep  Sleep:Sleep: Good Number of Hours of Sleep: 8    Physical Exam: Physical Exam HENT:     Head: Normocephalic.     Nose: No congestion or rhinorrhea.  Eyes:     General:        Left eye: Discharge (Redness of conjunctiva) present. Cardiovascular:     Rate and Rhythm: Tachycardia present.     Comments: Patient in no acute distress Pulmonary:     Effort: Pulmonary effort is normal.  Musculoskeletal:  General: Normal range of motion.     Cervical back: Normal range of motion.  Skin:    General: Skin is warm and dry.  Neurological:     Mental Status: She is alert and oriented to person, place, and time.  Psychiatric:         Attention and Perception: Attention normal.        Mood and Affect: Mood is depressed.        Speech: Speech normal.        Behavior: Behavior normal.        Thought Content: Thought content normal.        Cognition and Memory: Cognition normal.        Judgment: Judgment normal.   Review of Systems  Psychiatric/Behavioral:  Positive for depression and substance abuse (History of). Negative for hallucinations, memory loss and suicidal ideas. The patient is nervous/anxious. The patient does not have insomnia.   Blood pressure 91/67, pulse 90, temperature 97.8 F (36.6 C), temperature source Oral, resp. rate 17, height '5\' 2"'  (1.575 m), weight 46.7 kg, SpO2 99 %. Body mass index is 18.84 kg/m.   Treatment Plan Summary: Daily contact with patient to assess and evaluate symptoms and progress in treatment and Medication management  06/03/2021 update: Patient was stable overnight.  Patient required Ativan for severe anxiety prior to meeting the detectives today.  Physical symptoms of withdrawal are improving.  No vomiting or diarrhea.  Alert and oriented x4.  Suboxone taper to continue, with last dose being tomorrow.  Erythromycin ophthalmic ointment f continues or left eye irritation/possible infection.  Patient is exploring inpatient substance misuse programs with social work.  Bipolar disorder -Continue Depakote DR tablet 500 mg p.o. every 12 hours - Continue Abilify 5 mg p.o. daily for augmentation  Nicotine replacement/smoking cessation - Continue NicoDerm CQ transdermal patch 14 mg daily  Hypokalemia - Klor-Con CR tablet 40 mEq x 1 dose, given 06/01/2021 (completed)  Insomnia - Continue trazodone 50 mg as needed at bedtime as needed  Opioid withdrawal -Continue Suboxone taper: Suboxone 8-2 mg per SL tablet .Take 1 tablet sublingual daily x1 dose (completed 726). Followed by Suboxone 2-0.5 mg per SL tablet, 2 tablets sublingual  x1 dose (completed 7/27).  Followed by Suboxone 2-0.5  mg per SL tablet x 1 on 7/28.   Eye infection -Continue erythromycin ophthalmic ointment.  Use in left eye every 6 hours for 5 days   PRN, Other --Continue Tylenol 650 mg po every 6 hrs prn pain --Continue MAALOX/MYLANTA 30 mL po every 4 hrs prn indigestion --Continue Milk of Magnesia 30 mL po daily prn constipation    -Continue hydroxyzine tablets 25 mg p.o. 3 times daily as needed anxiety, vomiting  Severe anxiety - One-time dose of Ativan 1 mg p.o. given  Sherlon Handing, NP 06/03/2021, 4:09 PM

## 2021-06-03 NOTE — Progress Notes (Signed)
Pt has been sullen today and weepy at times. She has been eating her meals but isolative to her room. She is calm, cooperative, med compliant and pleasant. Collier Bullock RN

## 2021-06-03 NOTE — Progress Notes (Signed)
Patient presents with a flat and depressed affect. Patient stated that she had a tough day. Patient given support. Patient isolative to her room but did get up for snack and medication administration. Patient compliant with medications tonight per MD orders. Patient given education, and support to be active in her treatment plan. Patient being monitored Q 15 minutes for safety per unit protocol. Pt remains safe on the unit.

## 2021-06-03 NOTE — Progress Notes (Signed)
Recreation Therapy Notes   Date: 06/03/2021  Time: 10:00 am   Location: Craft room  Behavioral response: N/A   Intervention Topic: Goals  Discussion/Intervention: Patient did not attend group.   Clinical Observations/Feedback:  Patient did not attend group.   Tagg Eustice LRT/CTRS        Kellen Hover 06/03/2021 12:23 PM

## 2021-06-03 NOTE — [facility] Counselor (Signed)
  CSW sent referral information to [facility] for pt since pt stated she is interested in their residential treatment program.   CSW provided treatment center information from the following places: [place]  CSW will follow up with pt regarding interest in these treatment programs.   Pleasant Britz [place], MSW, LCSW-A 7/27/20224:02 PM

## 2021-06-03 NOTE — Plan of Care (Signed)
Patient endorses anxiety  Problem: Self-Concept: Goal: Level of anxiety will decrease Outcome: Not Progressing

## 2021-06-04 DIAGNOSIS — F314 Bipolar disorder, current episode depressed, severe, without psychotic features: Secondary | ICD-10-CM | POA: Diagnosis not present

## 2021-06-04 MED ORDER — ARIPIPRAZOLE 10 MG PO TABS
10.0000 mg | ORAL_TABLET | Freq: Every day | ORAL | Status: DC
  Administered 2021-06-05 – 2021-06-06 (×2): 10 mg via ORAL
  Filled 2021-06-04 (×2): qty 1

## 2021-06-04 NOTE — Plan of Care (Signed)
Patient endorses anxiety  Problem: Self-Concept: Goal: Level of anxiety will decrease Outcome: Not Progressing

## 2021-06-04 NOTE — Plan of Care (Signed)
Pt rates depression, anxiety and hopelessness all at 3/10. Pt denies SI, HI and AVH.  Pt was educated on care plan and verbalizes understanding. Collier Bullock RN Problem: Education: Goal: Ability to state activities that reduce stress will improve Outcome: Progressing   Problem: Coping: Goal: Ability to identify and develop effective coping behavior will improve Outcome: Progressing   Problem: Self-Concept: Goal: Level of anxiety will decrease Outcome: Not Progressing Goal: Ability to modify response to factors that promote anxiety will improve Outcome: Progressing   Problem: Education: Goal: Utilization of techniques to improve thought processes will improve Outcome: Progressing Goal: Knowledge of the prescribed therapeutic regimen will improve Outcome: Progressing   Problem: Coping: Goal: Coping ability will improve Outcome: Progressing   Problem: Education: Goal: Knowledge of [place] [facility] Education information/materials will improve Outcome: Progressing Goal: Emotional status will improve Outcome: Progressing Goal: Mental status will improve Outcome: Not Progressing Goal: Verbalization of understanding the information provided will improve Outcome: Progressing

## 2021-06-04 NOTE — Progress Notes (Signed)
[facility] MD Progress Note  06/04/2021 4:38 PM Autumn [name]  MRN:  [id number] CC: " I would like to go to the treatment center in [place]." Subjective:  Autumn [name] is a 45 year old with a history of bipolar 1 disorder, major depressive disorder, and substance misuse who was admitted for suicidal ideation subsequent to finding her boyfriend deceased from a drug overdose. Patient was slightly more interactive today.  She states that she wants to go to treatment center in [place].  Social work aware and trying to make contact with Center.  Patient continues to deny suicidal ideations, but continues to be very sad and for the most part withdrawn to her room.  I asked if she would like to speak with the chaplain and she indicated that she would.  Order put in.  Patient denies homicidal ideations, auditory or visual hallucinations.  Her appetite is poor.  Encouraged patient to continue to drink more, as her pulse rate tends to run high at times.  She denies any symptoms of lightheadedness, headache or any pain.  She does not complain of any body aches anymore.  Patient's behavior has been appropriate, no unsafe behavior noted.  Conjunctiva of left eye continues to be red, but is improved over yesterday.  Continue with erythromycin ophthalmic ointment through Sunday, 7/31.    Principal Problem: Bipolar I disorder, most recent episode depressed, severe without psychotic features ([place]) Diagnosis: Principal Problem:   Bipolar I disorder, most recent episode depressed, severe without psychotic features ([place]) Active Problems:   Tobacco use disorder   Amphetamine use disorder, severe (HCC)   Opioid use disorder, severe, dependence ([place])   Cannabis use disorder, severe, dependence ([place])   Alcohol abuse  Total Time spent with patient: 15 minutes  Past Psychiatric History:  Bipolar 1 disorder.  At least 2 hospitalizations prior to this one.  Illicit substance misuse    Past Medical History:  Past  Medical History:  Diagnosis Date   Allergy    Seasonal, Ultram (seizures)   Anemia 2005   Bipolar 1 disorder (HCC)    Glaucoma    Seizures ([place]) 2008    Past Surgical History:  Procedure Laterality Date   BREAST EXCISIONAL BIOPSY Right    x 2   CESAREAN SECTION     x 2   LEEP  2000   Family History:  Family History  Problem Relation Age of Onset   Hypertension Mother    Hyperlipidemia Mother    Asthma Mother    Parkinson's disease Father    Lung cancer Maternal Grandmother    Other Paternal Grandfather 31       Cardiac arrest   Heart disease Paternal Grandfather    Tourette syndrome Daughter    Family Psychiatric  History: Patient denies.  None reported Social History:  Social History   Substance and Sexual Activity  Alcohol Use Yes   Alcohol/week: 4.0 standard drinks   Types: 4 Cans of beer per week   Comment: Ocassional     Social History   Substance and Sexual Activity  Drug Use Yes   Types: Marijuana   Comment: percocet -last use over 1 week ago. Heroin-last used 2/11    Social History   Socioeconomic History   Marital status: Legally Separated    Spouse name: Not on file   Number of children: Not on file   Years of education: Not on file   Highest education level: Not on file  Occupational History   Not on file  Tobacco Use   Smoking status: Every Day    Packs/day: 1.00    Years: 9.00    Pack years: 9.00    Types: E-cigarettes, Cigarettes   Smokeless tobacco: Never  Vaping Use   Vaping Use: Never used  Substance and Sexual Activity   Alcohol use: Yes    Alcohol/week: 4.0 standard drinks    Types: 4 Cans of beer per week    Comment: Ocassional   Drug use: Yes    Types: Marijuana    Comment: percocet -last use over 1 week ago. Heroin-last used 2/11   Sexual activity: Yes    Birth control/protection: None  Other Topics Concern   Not on file  Social History Narrative   Not on file   Social Determinants of Health   Financial Resource  Strain: Not on file  Food Insecurity: Not on file  Transportation Needs: Not on file  Physical Activity: Not on file  Stress: Not on file  Social Connections: Not on file   Additional Social History:     Sleep: Good  Appetite:  Fair  Current Medications: Current Facility-Administered Medications  Medication Dose Route Frequency Provider Last Rate Last Admin   acetaminophen (TYLENOL) tablet 650 mg  650 mg Oral Q6H PRN Derrill Center, NP   650 mg at 06/02/21 1652   alum & mag hydroxide-simeth (MAALOX/MYLANTA) 200-200-20 MG/5ML suspension 30 mL  30 mL Oral Q4H PRN Derrill Center, NP       ARIPiprazole (ABILIFY) tablet 5 mg  5 mg Oral Daily Derrill Center, NP   5 mg at 06/04/21 0817   divalproex (DEPAKOTE) DR tablet 500 mg  500 mg Oral Q12H Derrill Center, NP   500 mg at 06/04/21 0817   erythromycin ophthalmic ointment   Left Eye Q6H Salley Scarlet, MD   1 application at [id number]   feeding supplement (ENSURE ENLIVE / ENSURE PLUS) liquid 237 mL  237 mL Oral TID BM Salley Scarlet, MD   237 mL at 06/04/21 1030   hydrOXYzine (ATARAX/VISTARIL) tablet 25 mg  25 mg Oral TID PRN Derrill Center, NP   25 mg at 06/04/21 0816   magnesium hydroxide (MILK OF MAGNESIA) suspension 30 mL  30 mL Oral Daily PRN Derrill Center, NP       nicotine (NICODERM CQ - dosed in mg/24 hours) patch 14 mg  14 mg Transdermal Daily Salley Scarlet, MD   14 mg at 06/04/21 0818   traZODone (DESYREL) tablet 50 mg  50 mg Oral QHS PRN Derrill Center, NP   50 mg at 06/03/21 2119    Lab Results:  No results found for this or any previous visit (from the past 48 hour(s)).   Blood Alcohol level:  Lab Results  Component Value Date   ETH <10 05/31/2021   ETH <10 123456    Metabolic Disorder Labs: Lab Results  Component Value Date   HGBA1C 5.6 06/02/2021   MPG 114 06/02/2021   MPG 108.28 09/13/2020   No results found for: PROLACTIN Lab Results  Component Value Date   CHOL 127 06/01/2021   TRIG  80 06/01/2021   HDL 48 06/01/2021   CHOLHDL 2.6 06/01/2021   VLDL 16 06/01/2021   LDLCALC 63 06/01/2021   LDLCALC 97 09/13/2020    Physical Findings: AIMS:  , ,  ,  ,    CIWA:    COWS:     Musculoskeletal: Strength & Muscle Tone:  within normal limits Gait & Station: normal Patient leans: N/A  Psychiatric Specialty Exam:  Presentation  General Appearance: Appropriate for Environment  Eye Contact:Good  Speech:Clear and Coherent  Speech Volume:Normal  Handedness:Right   Mood and Affect  Mood:Depressed  Affect:Congruent   Thought Process  Thought Processes:Coherent  Descriptions of Associations:Intact  Orientation:Full (Time, Place and Person)  Thought Content:WDL  History of Schizophrenia/Schizoaffective disorder:No  Duration of Psychotic Symptoms:No data recorded Hallucinations:Hallucinations: None (Denies, none observed)  Ideas of Reference:None (Denies)  Suicidal Thoughts:Suicidal Thoughts: No (Denies)  Homicidal Thoughts:Homicidal Thoughts: No (Denies)   Sensorium  Memory:Immediate Good; Recent Good  Judgment:Good (Exhibited in the hospital)  Insight:Fair   Executive Functions  Concentration:Poor  Attention Span:Poor  [place]   Psychomotor Activity  Psychomotor Activity:Psychomotor Activity: Decreased   Assets  Assets:Communication Skills; Desire for Improvement; Financial Resources/Insurance; Housing; Resilience; Physical Health   Sleep  Sleep:Sleep: Good Number of Hours of Sleep: 8    Physical Exam: Physical Exam HENT:     Head: Normocephalic.     Nose: No congestion or rhinorrhea.  Eyes:     General:        Left eye: Discharge (Redness of conjunctiva) present. Cardiovascular:     Comments: Patient in no acute distress Pulmonary:     Effort: Pulmonary effort is normal.  Musculoskeletal:        General: Normal range of motion.     Cervical back: Normal range of  motion.  Skin:    General: Skin is warm and dry.  Neurological:     Mental Status: She is alert and oriented to person, place, and time.  Psychiatric:        Attention and Perception: Attention normal.        Mood and Affect: Mood is depressed.        Speech: Speech normal.        Behavior: Behavior normal.        Thought Content: Thought content normal.        Cognition and Memory: Cognition normal.        Judgment: Judgment normal.   Review of Systems  Psychiatric/Behavioral:  Positive for depression and substance abuse (History of). Negative for hallucinations, memory loss and suicidal ideas. The patient is nervous/anxious. The patient does not have insomnia.   All other systems reviewed and are negative. Blood pressure 93/67, pulse (!) 101, temperature 97.8 F (36.6 C), temperature source Oral, resp. rate 17, height '5\' 2"'$  (1.575 m), weight 46.7 kg, SpO2 98 %. Body mass index is 18.84 kg/m.   Treatment Plan Summary: Daily contact with patient to assess and evaluate symptoms and progress in treatment and Medication management  06/04/2021 update: Patient was stable overnight.  Encouraged patient on eating and drinking fluids.  Order placed for visit from the chaplain at patient request.  Patient would like to go to treatment center in [place].  Get a trough level prior to morning dose of Depakote 7/29.  Bipolar disorder -Continue Depakote DR tablet 500 mg p.o. every 12 hours (trough to be drawn on 7/29) - Continue Abilify: Increase from 5 mg p.o. daily to 10 mg daily starting 7/29 for augmentation  Nicotine replacement/smoking cessation - Continue NicoDerm CQ transdermal patch 14 mg daily  Hypokalemia - Klor-Con CR tablet 40 mEq x 1 dose, given 06/01/2021 (completed)  Insomnia - Continue trazodone 50 mg as needed at bedtime as needed  Opioid withdrawal -Suboxone taper completed today   Feeding supplement -  Continue Ensure Enlive Ensure Plus 3 times daily  Eye  infection, improving -Continue erythromycin ophthalmic ointment.  Use in left eye every 6 hours for 5 days.   PRN, Other --Continue Tylenol 650 mg po every 6 hrs prn pain --Continue MAALOX/MYLANTA 30 mL po every 4 hrs prn indigestion --Continue Milk of Magnesia 30 mL po daily prn constipation    -Continue hydroxyzine tablets 25 mg p.o. 3 times daily as needed anxiety, vomiting  Sherlon Handing, NP 06/04/2021, 4:38 PM

## 2021-06-04 NOTE — Progress Notes (Signed)
Recreation Therapy Notes   Date: 06/04/2021  Time: 10:15 am   Location: Courtyard   Behavioral response: N/A   Intervention Topic: Leisure  Discussion/Intervention: Patient did not attend group.   Clinical Observations/Feedback:  Patient did not attend group.   Karolynn Infantino LRT/CTRS        Rajeev Escue 06/04/2021 11:11 AM

## 2021-06-04 NOTE — BHH Counselor (Signed)
CSW contacted [facility] to confirm fax referral for pt. No one was available, so CSW left voicemail with contact information. CSW will follow up with referral next steps.   Pt stated that she was interested in [facility].  Pt contacted Center for initial prescreening.  CSW will send referral information for pt to [facility] and follow up with [facility] staff.   Mekisha Bittel [place], MSW, LCSW-A 7/28/20223:31 PM

## 2021-06-04 NOTE — Progress Notes (Signed)
Patient presents with a flat and depressed affect. Patient stated that she had a tough day. Patient given support. Patient isolative to her room but did get up for snack and medication administration. Patient compliant with medications tonight per MD orders. Patient given education, and support to be active in her treatment plan. Patient being monitored Q 15 minutes for safety per unit protocol. Pt remains safe on the unit.

## 2021-06-04 NOTE — BHH Group Notes (Signed)
[facility] Group Notes:  (Nursing/MHT/Case Management/Adjunct)  Date:  06/04/2021  Time:  9:47 PM  Type of Therapy:  Group Therapy  Participation Level:  Active  Participation Quality:  Appropriate  Affect:  Appropriate and Depressed  Cognitive:  Alert  Insight:  Good  Engagement in Group:  Engaged and she is grieving and it a working progress.  Modes of Intervention:  Support  Summary of Progress/Problems:  Autumn [name] 06/04/2021, 9:47 PM

## 2021-06-04 NOTE — Progress Notes (Signed)
Chaplain Maggie made initial visit to meet patient per Order Requisition from physician. Patient was seated on her bed during the visit. Chaplain offered space for storytelling, empathetic listening, and prayerfully reading a psalm of lament. Slowly reading psalm 42 with the patient created space for talking about hope, forgiveness and kindness toward self. Patient spoke positively about transitioning to a place of rehabilitation. She expressed gratitude for the time together.

## 2021-06-04 NOTE — BHH Group Notes (Signed)
LCSW Group Therapy Note  06/04/2021 12:31 PM  Type of Therapy/Topic:  Group Therapy:  Balance in Life  Participation Level:  Did Not Attend  Description of Group:    This group will address the concept of balance and how it feels and looks when one is unbalanced. Patients will be encouraged to process areas in their lives that are out of balance and identify reasons for remaining unbalanced. Facilitators will guide patients in utilizing problem-solving interventions to address and correct the stressor making their life unbalanced. Understanding and applying boundaries will be explored and addressed for obtaining and maintaining a balanced life. Patients will be encouraged to explore ways to assertively make their unbalanced needs known to significant others in their lives, using other group members and facilitator for support and feedback.  Therapeutic Goals: Patient will identify two or more emotions or situations they have that consume much of in their lives. Patient will identify signs/triggers that life has become out of balance:  Patient will identify two ways to set boundaries in order to achieve balance in their lives:  Patient will demonstrate ability to communicate their needs through discussion and/or role plays  Summary of Patient Progress: X  Therapeutic Modalities:   Cognitive Behavioral Therapy Solution-Focused Therapy Assertiveness Training  Assunta Curtis MSW, LCSW 06/04/2021 12:31 PM

## 2021-06-04 NOTE — Progress Notes (Signed)
Pt has been cooperative, med compliant and withdrawn. She has a depressed affect. She has had a PRN for anxiety twice. She rates depression 5/10 and anxiety 4/10. Pt denies SI, HI and AVH. Collier Bullock RN

## 2021-06-05 DIAGNOSIS — F314 Bipolar disorder, current episode depressed, severe, without psychotic features: Secondary | ICD-10-CM | POA: Diagnosis not present

## 2021-06-05 LAB — VALPROIC ACID LEVEL: Valproic Acid Lvl: 116 ug/mL — ABNORMAL HIGH (ref 50.0–100.0)

## 2021-06-05 MED ORDER — DIVALPROEX SODIUM 500 MG PO DR TAB
500.0000 mg | DELAYED_RELEASE_TABLET | Freq: Every day | ORAL | Status: DC
  Administered 2021-06-05: 500 mg via ORAL
  Filled 2021-06-05: qty 1

## 2021-06-05 MED ORDER — DIVALPROEX SODIUM 250 MG PO DR TAB
250.0000 mg | DELAYED_RELEASE_TABLET | Freq: Every morning | ORAL | Status: DC
  Administered 2021-06-06: 250 mg via ORAL
  Filled 2021-06-05: qty 1

## 2021-06-05 NOTE — Progress Notes (Signed)
Recreation Therapy Notes   Date: 06/05/2021  Time: 10:00 am   Location: Craft room  Behavioral response: N/A   Intervention Topic: Creative expressions   Discussion/Intervention: Patient did not attend group.   Clinical Observations/Feedback:  Patient did not attend group.   Juli Odom LRT/CTRS        Ryu Cerreta 06/05/2021 11:36 AM

## 2021-06-05 NOTE — Plan of Care (Signed)
Patient stated that she is feeling tired otherwise her depression and anxiety getting little better. Denies SI,HI and AVH. Appropriate with staff & peers. Compliant with medications. Appetite good.Looking forward for discharge tomorrow to treatment center. Support and encouragement given.

## 2021-06-05 NOTE — BHH Counselor (Signed)
CSW informed that patient has been ACCEPTED to [facility].  CSW spoke with [employer].  Patient should arrive around [facility] on 06/06/2021.  CSW spoke with [place] with [employer] who confirmed that transportation was available for this trip and CSW was informed that 23 mile form will need to be handed to the driver.   CSW has shared this info with the treatment team.  Assunta Curtis, MSW, LCSW 06/05/2021 4:27 PM

## 2021-06-05 NOTE — Progress Notes (Signed)
[facility] MD Progress Note  06/05/2021 12:19 PM Autumn [name]  MRN:  [id number] CC: "I'm okay. " Subjective: 45 year old female presenting for worsening depression and suicidal ideation.  No acute events overnight, medication compliant, attending to ADLs.  Patient seen one-on-one this morning and notes that she is feeling okay.  She denies any active suicidal ideations, homicidal ideations, visual hallucinations, auditory hallucinations.  No side effects from current medication regimen.  Valproic acid level from this morning is elevated at 116, will decrease Depakote to 250 mg in the morning and 500 mg in the evening.  She continues to desire residential substance abuse treatment.  She has a telephone interview with [facility] treatment center today.     Principal Problem: Bipolar I disorder, most recent episode depressed, severe without psychotic features ([place]) Diagnosis: Principal Problem:   Bipolar I disorder, most recent episode depressed, severe without psychotic features ([place]) Active Problems:   Tobacco use disorder   Amphetamine use disorder, severe (HCC)   Opioid use disorder, severe, dependence ([place])   Cannabis use disorder, severe, dependence ([place])   Alcohol abuse  Total Time spent with patient: 20 minutes  Past Psychiatric History: See H&P Past Medical History:  Past Medical History:  Diagnosis Date   Allergy    Seasonal, Ultram (seizures)   Anemia 2005   Bipolar 1 disorder ([place])    Glaucoma    Seizures ([place]) 2008    Past Surgical History:  Procedure Laterality Date   BREAST EXCISIONAL BIOPSY Right    x 2   CESAREAN SECTION     x 2   LEEP  2000   Family History:  Family History  Problem Relation Age of Onset   Hypertension Mother    Hyperlipidemia Mother    Asthma Mother    Parkinson's disease Father    Lung cancer Maternal Grandmother    Other Paternal Grandfather 35       Cardiac arrest   Heart disease Paternal Grandfather    Tourette syndrome Daughter     Family Psychiatric  History: See H&P Social History:  Social History   Substance and Sexual Activity  Alcohol Use Yes   Alcohol/week: 4.0 standard drinks   Types: 4 Cans of beer per week   Comment: Ocassional     Social History   Substance and Sexual Activity  Drug Use Yes   Types: Marijuana   Comment: percocet -last use over 1 week ago. Heroin-last used 2/11    Social History   Socioeconomic History   Marital status: Legally Separated    Spouse name: Not on file   Number of children: Not on file   Years of education: Not on file   Highest education level: Not on file  Occupational History   Not on file  Tobacco Use   Smoking status: Every Day    Packs/day: 1.00    Years: 9.00    Pack years: 9.00    Types: E-cigarettes, Cigarettes   Smokeless tobacco: Never  Vaping Use   Vaping Use: Never used  Substance and Sexual Activity   Alcohol use: Yes    Alcohol/week: 4.0 standard drinks    Types: 4 Cans of beer per week    Comment: Ocassional   Drug use: Yes    Types: Marijuana    Comment: percocet -last use over 1 week ago. Heroin-last used 2/11   Sexual activity: Yes    Birth control/protection: None  Other Topics Concern   Not on file  Social History Narrative  Not on file   Social Determinants of Health   Financial Resource Strain: Not on file  Food Insecurity: Not on file  Transportation Needs: Not on file  Physical Activity: Not on file  Stress: Not on file  Social Connections: Not on file   Additional Social History:     Sleep: Good  Appetite:  Fair  Current Medications: Current Facility-Administered Medications  Medication Dose Route Frequency Provider Last Rate Last Admin   acetaminophen (TYLENOL) tablet 650 mg  650 mg Oral Q6H PRN Derrill Center, NP   650 mg at 06/02/21 1652   alum & mag hydroxide-simeth (MAALOX/MYLANTA) 200-200-20 MG/5ML suspension 30 mL  30 mL Oral Q4H PRN Derrill Center, NP       ARIPiprazole (ABILIFY) tablet 10 mg   10 mg Oral Daily Waldon Merl F, NP   10 mg at 06/05/21 0756   [START ON 06/06/2021] divalproex (DEPAKOTE) DR tablet 250 mg  250 mg Oral q AM Salley Scarlet, MD       divalproex (DEPAKOTE) DR tablet 500 mg  500 mg Oral QHS Salley Scarlet, MD       erythromycin ophthalmic ointment   Left Eye Q6H Salley Scarlet, MD   1 application at 0000000 0640   feeding supplement (ENSURE ENLIVE / ENSURE PLUS) liquid 237 mL  237 mL Oral TID BM Salley Scarlet, MD   237 mL at 06/05/21 1024   hydrOXYzine (ATARAX/VISTARIL) tablet 25 mg  25 mg Oral TID PRN Derrill Center, NP   25 mg at 06/04/21 2130   magnesium hydroxide (MILK OF MAGNESIA) suspension 30 mL  30 mL Oral Daily PRN Derrill Center, NP       nicotine (NICODERM CQ - dosed in mg/24 hours) patch 14 mg  14 mg Transdermal Daily Salley Scarlet, MD   14 mg at 06/05/21 0756   traZODone (DESYREL) tablet 50 mg  50 mg Oral QHS PRN Derrill Center, NP   50 mg at 06/04/21 2130    Lab Results:  Results for orders placed or performed during the hospital encounter of 05/31/21 (from the past 48 hour(s))  Valproic acid level     Status: Abnormal   Collection Time: 06/05/21  6:50 AM  Result Value Ref Range   Valproic Acid Lvl 116 (H) 50.0 - 100.0 ug/mL    Comment: Performed at [facility], [address] [place], [place] [address]     Blood Alcohol level:  Lab Results  Component Value Date   [facility] <10 05/31/2021   ETH <10 123456    Metabolic Disorder Labs: Lab Results  Component Value Date   HGBA1C 5.6 06/02/2021   MPG 114 06/02/2021   MPG 108.28 09/13/2020   No results found for: PROLACTIN Lab Results  Component Value Date   CHOL 127 06/01/2021   TRIG 80 06/01/2021   HDL 48 06/01/2021   CHOLHDL 2.6 06/01/2021   VLDL 16 06/01/2021   LDLCALC 63 06/01/2021   LDLCALC 97 09/13/2020    Physical Findings: AIMS:  , ,  ,  ,    CIWA:    COWS:     Musculoskeletal: Strength & Muscle Tone: within normal limits Gait &  Station: normal Patient leans: N/A  Psychiatric Specialty Exam:  Presentation  General Appearance: Appropriate for Environment  Eye Contact:Good  Speech:Clear and Coherent  Speech Volume:Normal  Handedness:Right   Mood and Affect  Mood:Depressed  Affect:Flat; Congruent   Thought Process  Thought Processes:Coherent  Descriptions of Associations:Intact  Orientation:Full (Time, Place and Person)  Thought Content:WDL  History of Schizophrenia/Schizoaffective disorder:No  Duration of Psychotic Symptoms:No data recorded Hallucinations:Hallucinations: None (Denies)  Ideas of Reference:None (Denies)  Suicidal Thoughts:Suicidal Thoughts: No (Denies)  Homicidal Thoughts:Homicidal Thoughts: No (Denies)   Sensorium  Memory:Immediate Good; Recent Good  Judgment:Good (In the hospital)  Insight:Good   Executive Functions  Concentration:Fair  Attention Span:Good  [place]   Psychomotor Activity  Psychomotor Activity:No data recorded   Assets  Assets:Communication Skills; Desire for Improvement; Financial Resources/Insurance; Housing; Resilience; Physical Health   Sleep  Sleep:Sleep: Good Number of Hours of Sleep: 7.75    Blood pressure 90/69, pulse 79, temperature 98.2 F (36.8 C), temperature source Oral, resp. rate 18, height '5\' 2"'$  (1.575 m), weight 46.7 kg, SpO2 98 %. Body mass index is 18.84 kg/m.   Treatment Plan Summary: Daily contact with patient to assess and evaluate symptoms and progress in treatment and Medication management   Bipolar disorder, current episode depressed without psychotic features - Valproic acid level elevated at 116.  Decrease Depakote 250 mg in the morning 500 mg in the evening. - Continue Abilify 10 mg daily  Nicotine replacement/smoking cessation - Continue NicoDerm CQ transdermal patch 14 mg daily  Insomnia - Continue trazodone 50 mg as needed at bedtime as  needed  Opioid use disorder, severe -Suboxone taper completed, residential substance abuse treatment  Feeding supplement - Continue Ensure Enlive Ensure Plus 3 times daily  Eye infection, improving -Continue erythromycin ophthalmic ointment.  Use in left eye every 6 hours for 5 days.   PRN, Other --Continue Tylenol 650 mg po every 6 hrs prn pain --Continue MAALOX/MYLANTA 30 mL po every 4 hrs prn indigestion --Continue Milk of Magnesia 30 mL po daily prn constipation    -Continue hydroxyzine tablets 25 mg p.o. 3 times daily as needed anxiety, vomiting  Salley Scarlet, MD 06/05/2021, 12:19 PM

## 2021-06-05 NOTE — Progress Notes (Signed)
Patient pleasant and cooperative. Denies SI/HI/AVH reports depression and anxiety are better. Received medication without incident.  Has been active on the unit in the day room and engaging with others.Will continue to monitor with Q 15 minute safety checks.   Cleo Butler-Nicholson, LPN

## 2021-06-05 NOTE — H&P (Deleted)
CSW met with the patient and completed the [facility] application.  CSW faxed the referral and received confirmation that fax was successful.  CSW called [facility] to obtain a fax number so that follow up information can be faxed.  CSW obtained the following admission fax number [phone].  CSW sent the fax and received confirmation the fax was successful.   CSW followed up with [facility], CSW was informed that the patient has [employer] and [facility] does not accept private insurance.   Assunta Curtis, MSW, LCSW 06/05/2021 11:37 AM

## 2021-06-05 NOTE — BHH Counselor (Signed)
CSW met with the patient and completed the BATS application.  CSW faxed the referral and received confirmation that fax was successful.  CSW called [employer] to obtain a fax number so that follow up information can be faxed.  CSW obtained the following admission fax number [phone].  CSW sent the fax and received confirmation the fax was successful.   CSW followed up with [facility], CSW was informed that the patient has [employer]BS and [facility] does not accept private insurance.   CSW was informed that [facility] has a no beds available for the next two weeks.    Assunta Curtis, MSW, LCSW 06/05/2021 11:41 AM

## 2021-06-05 NOTE — Plan of Care (Signed)
  Problem: Education: Goal: Ability to state activities that reduce stress will improve Outcome: Progressing   Problem: Coping: Goal: Ability to identify and develop effective coping behavior will improve Outcome: Progressing   Problem: Self-Concept: Goal: Level of anxiety will decrease Outcome: Progressing Goal: Ability to modify response to factors that promote anxiety will improve Outcome: Progressing   Problem: Education: Goal: Utilization of techniques to improve thought processes will improve Outcome: Progressing Goal: Knowledge of the prescribed therapeutic regimen will improve Outcome: Progressing   Problem: Coping: Goal: Coping ability will improve Outcome: Progressing   Problem: Education: Goal: Knowledge of [facility] Education information/materials will improve Outcome: Progressing Goal: Emotional status will improve Outcome: Progressing Goal: Mental status will improve Outcome: Progressing Goal: Verbalization of understanding the information provided will improve Outcome: Progressing

## 2021-06-05 NOTE — BHH Group Notes (Signed)
LCSW Group Therapy Note     06/05/2021 2:19 PM  Type of Therapy and Topic:  Group Therapy:  Feelings around Relapse and Recovery  Participation Level:  Did Not Attend  Description of Group:    Patients in this group will discuss emotions they experience before and after a relapse. They will process how experiencing these feelings, or avoidance of experiencing them, relates to having a relapse. Facilitator will guide patients to explore emotions they have related to recovery. Patients will be encouraged to process which emotions are more powerful. They will be guided to discuss the emotional reaction significant others in their lives may have to their relapse or recovery. Patients will be assisted in exploring ways to respond to the emotions of others without this contributing to a relapse.     Therapeutic Goals:  1.    Patient will identify two or more emotions that lead to a relapse for them 2.    Patient will identify two emotions that result when they relapse 3.    Patient will identify two emotions related to recovery 4.    Patient will demonstrate ability to communicate their needs through discussion and/or role plays    Summary of Patient Progress: Patient did not attend group despite encouraged participation.     Therapeutic Modalities:  Cognitive Behavioral Therapy Solution-Focused Therapy Assertiveness Training Relapse Prevention Therapy    Paulla Dolly, LCSW, LCAS-A  06/05/2021 2:19 PM

## 2021-06-05 NOTE — BHH Counselor (Signed)
Patient provided contact information to [facility] [phone]) and observed speaking with representative on the phone.   Signed:  Durenda Hurt, MSW, [place], LCASA 06/05/2021 2:56 PM

## 2021-06-05 NOTE — BHH Counselor (Signed)
CSW received a call back from [place] at [facility], she reports that patient's referral has been reviewed with no major red flags.  She reports that patient needs to complete a prescreening and should call [phone].  CSW has passed this information on to treatment team.  Assunta Curtis, MSW, LCSW 06/05/2021 11:54 AM

## 2021-06-06 DIAGNOSIS — F314 Bipolar disorder, current episode depressed, severe, without psychotic features: Secondary | ICD-10-CM | POA: Diagnosis not present

## 2021-06-06 MED ORDER — HYDROXYZINE HCL 25 MG PO TABS: 25.0000 mg | ORAL_TABLET | Freq: Three times a day (TID) | ORAL | 1 refills | Status: DC | PRN

## 2021-06-06 MED ORDER — DIVALPROEX SODIUM 250 MG PO DR TAB: 250.0000 mg | DELAYED_RELEASE_TABLET | Freq: Every morning | ORAL | 1 refills | Status: DC

## 2021-06-06 MED ORDER — DIVALPROEX SODIUM 500 MG PO DR TAB: 500.0000 mg | DELAYED_RELEASE_TABLET | Freq: Every day | ORAL | 1 refills | Status: DC

## 2021-06-06 MED ORDER — ARIPIPRAZOLE 10 MG PO TABS: 10.0000 mg | ORAL_TABLET | Freq: Every day | ORAL | 1 refills | Status: DC

## 2021-06-06 MED ORDER — TRAZODONE HCL 50 MG PO TABS: 50.0000 mg | ORAL_TABLET | Freq: Every evening | ORAL | 1 refills | Status: DC | PRN

## 2021-06-06 NOTE — Progress Notes (Signed)
CSW unable to provide transportation to [facility] until discharge orders are placed.

## 2021-06-06 NOTE — Progress Notes (Signed)
D:Patient denies SI/HI/AVH, able to contract for safety at this time. Pt appears calm and cooperative, and no distress noted.   A: All Personal items in locker returned to pt. Upon discharge. All applicable medications given.   R:  Pt States she will comply with discharge planning put into place and take MEDS as prescribed. Pt escorted out of the building by this writer/staff.

## 2021-06-06 NOTE — Tx Team (Cosign Needed Addendum)
Interdisciplinary Treatment and Diagnostic Plan Update  06/06/2021 Time of Session: 10:33 AM  Autumn [name] MRN: [id number]  Principal Diagnosis: Bipolar I disorder, most recent episode depressed, severe without psychotic features ([place])  Secondary Diagnoses: Principal Problem:   Bipolar I disorder, most recent episode depressed, severe without psychotic features ([place]) Active Problems:   Tobacco use disorder   Amphetamine use disorder, severe (HCC)   Opioid use disorder, severe, dependence ([place])   Cannabis use disorder, severe, dependence ([place])   Alcohol abuse   Current Medications:  Current Facility-Administered Medications  Medication Dose Route Frequency Provider Last Rate Last Admin   acetaminophen (TYLENOL) tablet 650 mg  650 mg Oral Q6H PRN Derrill Center, NP   650 mg at 06/05/21 1711   alum & mag hydroxide-simeth (MAALOX/MYLANTA) 200-200-20 MG/5ML suspension 30 mL  30 mL Oral Q4H PRN Derrill Center, NP       ARIPiprazole (ABILIFY) tablet 10 mg  10 mg Oral Daily Waldon Merl F, NP   10 mg at 06/06/21 0756   divalproex (DEPAKOTE) DR tablet 250 mg  250 mg Oral q AM Salley Scarlet, MD   250 mg at 06/06/21 0757   divalproex (DEPAKOTE) DR tablet 500 mg  500 mg Oral QHS Salley Scarlet, MD   500 mg at 06/05/21 2118   erythromycin ophthalmic ointment   Left Eye Q6H Salley Scarlet, MD   1 application at [id number]   feeding supplement (ENSURE ENLIVE / ENSURE PLUS) liquid 237 mL  237 mL Oral TID BM Salley Scarlet, MD   237 mL at 06/06/21 [id number]   hydrOXYzine (ATARAX/VISTARIL) tablet 25 mg  25 mg Oral TID PRN Derrill Center, NP   25 mg at 06/05/21 1631   magnesium hydroxide (MILK OF MAGNESIA) suspension 30 mL  30 mL Oral Daily PRN Derrill Center, NP       nicotine (NICODERM CQ - dosed in mg/24 hours) patch 14 mg  14 mg Transdermal Daily Salley Scarlet, MD   14 mg at 06/06/21 0756   traZODone (DESYREL) tablet 50 mg  50 mg Oral QHS PRN Derrill Center, NP   50 mg at 06/05/21  2118   PTA Medications: Medications Prior to Admission  Medication Sig Dispense Refill Last Dose   ARIPiprazole (ABILIFY) 20 MG tablet Take 1 tablet (20 mg total) by mouth daily. 30 tablet 1    benztropine (COGENTIN) 1 MG tablet Take 1 tablet (1 mg total) by mouth daily. 30 tablet 0    divalproex (DEPAKOTE) 500 MG DR tablet Take 1 tablet (500 mg total) by mouth every 12 (twelve) hours. 60 tablet 1    hydrOXYzine (ATARAX/VISTARIL) 25 MG tablet Take 1 tablet (25 mg total) by mouth 3 (three) times daily as needed for itching (itching, rash). 20 tablet 0    lithium carbonate (ESKALITH) 450 MG CR tablet Take 1 tablet (450 mg total) by mouth every 12 (twelve) hours. 60 tablet 1    OXcarbazepine (TRILEPTAL) 150 MG tablet Take 150 mg by mouth 2 (two) times daily. (Patient not taking: No sig reported)       Patient Stressors: Financial difficulties Loss of boyfriend Traumatic event  Patient Strengths: Ability for Science writer Motivation for treatment/growth  Treatment Modalities: Medication Management, Group therapy, Case management,  1 to 1 session with clinician, Psychoeducation, Recreational therapy.   Physician Treatment Plan for Primary Diagnosis: Bipolar I disorder, most recent episode depressed, severe without psychotic features ([place]) Long Term  Goal(s): Improvement in symptoms so as ready for discharge   Short Term Goals: Ability to identify changes in lifestyle to reduce recurrence of condition will improve Ability to verbalize feelings will improve Ability to disclose and discuss suicidal ideas Ability to demonstrate self-control will improve Ability to identify and develop effective coping behaviors will improve Ability to maintain clinical measurements within normal limits will improve Compliance with prescribed medications will improve Ability to identify triggers associated with substance abuse/mental health issues will improve  Medication Management: Evaluate  patient's response, side effects, and tolerance of medication regimen.  Therapeutic Interventions: 1 to 1 sessions, Unit Group sessions and Medication administration.  Evaluation of Outcomes: Progressing  Physician Treatment Plan for Secondary Diagnosis: Principal Problem:   Bipolar I disorder, most recent episode depressed, severe without psychotic features ([place]) Active Problems:   Tobacco use disorder   Amphetamine use disorder, severe (HCC)   Opioid use disorder, severe, dependence ([place])   Cannabis use disorder, severe, dependence ([place])   Alcohol abuse  Long Term Goal(s): Improvement in symptoms so as ready for discharge   Short Term Goals: Ability to identify changes in lifestyle to reduce recurrence of condition will improve Ability to verbalize feelings will improve Ability to disclose and discuss suicidal ideas Ability to demonstrate self-control will improve Ability to identify and develop effective coping behaviors will improve Ability to maintain clinical measurements within normal limits will improve Compliance with prescribed medications will improve Ability to identify triggers associated with substance abuse/mental health issues will improve     Medication Management: Evaluate patient's response, side effects, and tolerance of medication regimen.  Therapeutic Interventions: 1 to 1 sessions, Unit Group sessions and Medication administration.  Evaluation of Outcomes: Progressing   RN Treatment Plan for Primary Diagnosis: Bipolar I disorder, most recent episode depressed, severe without psychotic features ([place]) Long Term Goal(s): Knowledge of disease and therapeutic regimen to maintain health will improve  Short Term Goals: Ability to remain free from injury will improve, Ability to verbalize frustration and anger appropriately will improve, Ability to demonstrate self-control, Ability to participate in decision making will improve, Ability to verbalize feelings will  improve, Ability to disclose and discuss suicidal ideas, Ability to identify and develop effective coping behaviors will improve, and Compliance with prescribed medications will improve  Medication Management: RN will administer medications as ordered by provider, will assess and evaluate patient's response and provide education to patient for prescribed medication. RN will report any adverse and/or side effects to prescribing provider.  Therapeutic Interventions: 1 on 1 counseling sessions, Psychoeducation, Medication administration, Evaluate responses to treatment, Monitor vital signs and CBGs as ordered, Perform/monitor CIWA, COWS, AIMS and Fall Risk screenings as ordered, Perform wound care treatments as ordered.  Evaluation of Outcomes: Progressing   LCSW Treatment Plan for Primary Diagnosis: Bipolar I disorder, most recent episode depressed, severe without psychotic features ([place]) Long Term Goal(s): Safe transition to appropriate next level of care at discharge, Engage patient in therapeutic group addressing interpersonal concerns.  Short Term Goals: Engage patient in aftercare planning with referrals and resources, Increase social support, Increase ability to appropriately verbalize feelings, Increase emotional regulation, Facilitate acceptance of mental health diagnosis and concerns, Facilitate patient progression through stages of change regarding substance use diagnoses and concerns, Identify triggers associated with mental health/substance abuse issues, and Increase skills for wellness and recovery  Therapeutic Interventions: Assess for all discharge needs, 1 to 1 time with Social worker, Explore available resources and support systems, Assess for adequacy in community support  network, Educate family and significant other(s) on suicide prevention, Complete Psychosocial Assessment, Interpersonal group therapy.  Evaluation of Outcomes: Progressing   Progress in Treatment: Attending  groups: No. Participating in groups: No. Taking medication as prescribed: Yes. Toleration medication: Yes. Family/Significant other contact made: Yes, individual(s) contacted:  Mother was contacted Patient understands diagnosis: Yes. Discussing patient identified problems/goals with staff: Yes. Medical problems stabilized or resolved: Yes. Denies suicidal/homicidal ideation: Yes. Issues/concerns per patient self-inventory: Yes. Other: None  New problem(s) identified: No, Describe:  None  New Short Term/Long Term Goal(s): Elimination of symptoms of psychosis, medication management for mood stabilization; development of comprehensive mental wellness plan.    Patient Goals: "Try to go to groups . . . Worried I am going to use again" Endorsed desire to attend residential SUD treatment. Update: 06/06/21. Patient was accepted into [facility]   Discharge Plan or Barriers: No barriers foreseen at this time Update: 06/06/21- Patient is to be discharged on 06/06/21 to [facility]   Reason for Continuation of Hospitalization: Anxiety Depression Suicidal ideation  Estimated Length of Stay:1-3 days   Attendees: Patient: 06/06/2021 10:34 AM  Physician: Dr. Weber Cooks, MD  06/06/2021 10:34 AM  Nursing:  06/06/2021 10:34 AM  RN Care Manager: 06/06/2021 10:34 AM  Social Worker: Raina Mina, [place] 06/06/2021 10:34 AM  Recreational Therapist:  06/06/2021 10:34 AM  Other:  06/06/2021 10:34 AM  Other:  06/06/2021 10:34 AM  Other: 06/06/2021 10:34 AM    Scribe for Treatment Team: Raina Mina, [place] 06/06/2021 10:34 AM

## 2021-06-06 NOTE — Progress Notes (Addendum)
Patient during assessments this morning endorsed an euthymic mood with a mildly constricted but relaxed affect and brief to fair eye contact. She denied si/hi/avh and endorsed ability to continue to remain safe on the unit. She endorsed an ability to remain safe upon discharge with safety plans in place to avoid self harm. She endorsed ability to take medications as prescribed upon discharge. Her orientation appeared grossly intact. She denied physical pain and endorsed toleration of medications thus far. She endorsed mild problems with sleeping over the night d/t nightmares, but appetite is normal. Pt. Had no complaints for this Probation officer. Discussed additionally that discharge information would be provided as information is being processed for pending discharge. Her thought process was grossly intact to conversation.   Patient has been complaint with medications and unit procedures thus far. Pt. Has been observed eating good thus far, observed up to eat breakfast in the milieu with peers. Pt. Has been able to remain safe on the unit thus far. Pt. Outside of breakfast this morning has been observed resting in her room.   Q x 15 minute observation checks in place/maintained for safety. Patient is provided with education throughout shift when appropriate and able.  Patient is given/offered medications per orders. Patient is encouraged to attend groups, participate in unit activities and continue with plan of care, until pending discharge is complete. Pt. Chart and plans of care reviewed. Pt. Given support and encouragement when appropriate and able. Pt. Given discharge information as it is processed for pending discharge.

## 2021-06-06 NOTE — Discharge Summary (Signed)
Physician Discharge Summary Note  Patient:  Autumn [name] is an 45 y.o., female MRN:  [id number] DOB:  08/19/1976 Patient phone:  [phone] (home)  Patient address:   [place] [address],  Total Time spent with patient: 30 minutes  Date of Admission:  05/31/2021 Date of Discharge: 06/06/2021  Reason for Admission: Patient with schizoaffective disorder and substance abuse problems came into the hospital with depressed mood disorganized thinking hopelessness suicidal ideation.  Principal Problem: Bipolar I disorder, most recent episode depressed, severe without psychotic features [facility]) Discharge Diagnoses: Principal Problem:   Bipolar I disorder, most recent episode depressed, severe without psychotic features ([place]) Active Problems:   Tobacco use disorder   Amphetamine use disorder, severe (HCC)   Opioid use disorder, severe, dependence ([place])   Cannabis use disorder, severe, dependence ([place])   Alcohol abuse   Past Psychiatric History: History of chronic illness with both mood and psychotic disorder and substance abuse.  Multiple prior hospitalizations  Past Medical History:  Past Medical History:  Diagnosis Date   Allergy    Seasonal, Ultram (seizures)   Anemia 2005   Bipolar 1 disorder ([place])    Glaucoma    Seizures ([place]) 2008    Past Surgical History:  Procedure Laterality Date   BREAST EXCISIONAL BIOPSY Right    x 2   CESAREAN SECTION     x 2   LEEP  2000   Family History:  Family History  Problem Relation Age of Onset   Hypertension Mother    Hyperlipidemia Mother    Asthma Mother    Parkinson's disease Father    Lung cancer Maternal Grandmother    Other Paternal Grandfather 72       Cardiac arrest   Heart disease Paternal Grandfather    Tourette syndrome Daughter    Family Psychiatric  History: See previous Social History:  Social History   Substance and Sexual Activity  Alcohol Use Yes   Alcohol/week: 4.0 standard drinks    Types: 4 Cans of beer per week   Comment: Ocassional     Social History   Substance and Sexual Activity  Drug Use Yes   Types: Marijuana   Comment: percocet -last use over 1 week ago. Heroin-last used 2/11    Social History   Socioeconomic History   Marital status: Legally Separated    Spouse name: Not on file   Number of children: Not on file   Years of education: Not on file   Highest education level: Not on file  Occupational History   Not on file  Tobacco Use   Smoking status: Every Day    Packs/day: 1.00    Years: 9.00    Pack years: 9.00    Types: E-cigarettes, Cigarettes   Smokeless tobacco: Never  Vaping Use   Vaping Use: Never used  Substance and Sexual Activity   Alcohol use: Yes    Alcohol/week: 4.0 standard drinks    Types: 4 Cans of beer per week    Comment: Ocassional   Drug use: Yes    Types: Marijuana    Comment: percocet -last use over 1 week ago. Heroin-last used 2/11   Sexual activity: Yes    Birth control/protection: None  Other Topics Concern   Not on file  Social History Narrative   Not on file   Social Determinants of Health   Financial Resource Strain: Not on file  Food Insecurity: Not on file  Transportation Needs: Not on file  Physical  Activity: Not on file  Stress: Not on file  Social Connections: Not on file    Hospital Course: Patient treated with medication as appropriate for illness including antipsychotic and mood stabilizing medicine.  Patient engaged in groups appropriately and assessment by full treatment team.  Application was made for inpatient substance abuse rehab.  Patient has been accepted at [facility] and will be transferred there today.  She is agreeable to the plan.  Prescriptions provided at discharge.  At this point not showing signs of active psychosis and denying suicidal ideation and willing to engage in appropriate rehab treatment.  Physical Findings: AIMS:  , ,  ,  ,    CIWA:    COWS:      Musculoskeletal: Strength & Muscle Tone: within normal limits Gait & Station: normal Patient leans: N/A   Psychiatric Specialty Exam:  Presentation  General Appearance: Appropriate for Environment  Eye Contact:Good  Speech:Clear and Coherent  Speech Volume:Normal  Handedness:Right   Mood and Affect  Mood:Depressed  Affect:Flat; Congruent   Thought Process  Thought Processes:Coherent  Descriptions of Associations:Intact  Orientation:Full (Time, Place and Person)  Thought Content:WDL  History of Schizophrenia/Schizoaffective disorder:No  Duration of Psychotic Symptoms:No data recorded Hallucinations:No data recorded Ideas of Reference:None (Denies)  Suicidal Thoughts:No data recorded Homicidal Thoughts:No data recorded  Sensorium  Memory:Immediate Good; Recent Good  Judgment:Good (In the hospital)  Insight:Good   Executive Functions  Concentration:Fair  Attention Span:Good  [place]   Psychomotor Activity  Psychomotor Activity: No data recorded  Assets  Assets:Communication Skills; Desire for Improvement; Financial Resources/Insurance; Housing; Resilience; Physical Health   Sleep  Sleep: No data recorded   Physical Exam: Physical Exam Vitals and nursing note reviewed.  Constitutional:      Appearance: Normal appearance.  HENT:     Head: Normocephalic and atraumatic.     Mouth/Throat:     Pharynx: Oropharynx is clear.  Eyes:     Pupils: Pupils are equal, round, and reactive to light.  Cardiovascular:     Rate and Rhythm: Normal rate and regular rhythm.  Pulmonary:     Effort: Pulmonary effort is normal.     Breath sounds: Normal breath sounds.  Abdominal:     General: Abdomen is flat.     Palpations: Abdomen is soft.  Musculoskeletal:        General: Normal range of motion.  Skin:    General: Skin is warm and dry.  Neurological:     General: No focal deficit present.      Mental Status: She is alert. Mental status is at baseline.  Psychiatric:        Mood and Affect: Mood normal.        Thought Content: Thought content normal.   Review of Systems  Constitutional: Negative.   HENT: Negative.    Eyes: Negative.   Respiratory: Negative.    Cardiovascular: Negative.   Gastrointestinal: Negative.   Musculoskeletal: Negative.   Skin: Negative.   Neurological: Negative.   Psychiatric/Behavioral: Negative.    Blood pressure 93/69, pulse 85, temperature (!) 97.5 F (36.4 C), temperature source Oral, resp. rate 18, height '5\' 2"'$  (1.575 m), weight 46.7 kg, SpO2 100 %. Body mass index is 18.84 kg/m.   Social History   Tobacco Use  Smoking Status Every Day   Packs/day: 1.00   Years: 9.00   Pack years: 9.00   Types: E-cigarettes, Cigarettes  Smokeless Tobacco Never   Tobacco Cessation:  N/A, patient does not currently use tobacco products   Blood Alcohol level:  Lab Results  Component Value Date   ETH <10 05/31/2021   ETH <10 [id number]    Metabolic Disorder Labs:  Lab Results  Component Value Date   HGBA1C 5.6 06/02/2021   MPG 114 06/02/2021   MPG 108.28 09/13/2020   No results found for: PROLACTIN Lab Results  Component Value Date   CHOL 127 06/01/2021   TRIG 80 06/01/2021   HDL 48 06/01/2021   CHOLHDL 2.6 06/01/2021   VLDL 16 06/01/2021   LDLCALC 63 06/01/2021   LDLCALC 97 09/13/2020    See Psychiatric Specialty Exam and Suicide Risk Assessment completed by Attending Physician prior to discharge.  Discharge destination: [place]  Is patient on multiple antipsychotic therapies at discharge:  No   Has Patient had three or more failed trials of antipsychotic monotherapy by history:  No  Recommended Plan for Multiple Antipsychotic Therapies: NA  Discharge Instructions     Diet - low sodium heart healthy   Complete by: As directed    Increase activity slowly   Complete by: As directed       Allergies as  of 06/06/2021       Reactions   Haldol [haloperidol Lactate]    Patient states that she gets stiff muscles when taking Haldol   Ultram [tramadol] Other (See Comments)   Seizures   Ziprasidone Hcl    Other reaction(s): Other (See Comments) PER PT WHEN SHE TAKES THIS SHE CANT MOVE HER NECK        Medication List     STOP taking these medications    benztropine 1 MG tablet Commonly known as: COGENTIN   lithium carbonate 450 MG CR tablet Commonly known as: ESKALITH   OXcarbazepine 150 MG tablet Commonly known as: TRILEPTAL       TAKE these medications      Indication  ARIPiprazole 10 MG tablet Commonly known as: ABILIFY Take 1 tablet (10 mg total) by mouth daily. Start taking on: June 07, 2021 What changed:  medication strength how much to take  Indication: MIXED BIPOLAR AFFECTIVE DISORDER   divalproex 500 MG DR tablet Commonly known as: DEPAKOTE Take 1 tablet (500 mg total) by mouth at bedtime. What changed: You were already taking a medication with the same name, and this prescription was added. Make sure you understand how and when to take each.  Indication: Depressive Phase of Manic-Depression   divalproex 250 MG DR tablet Commonly known as: DEPAKOTE Take 1 tablet (250 mg total) by mouth in the morning. Start taking on: June 07, 2021 What changed:  medication strength how much to take when to take this  Indication: Manic-Depression   hydrOXYzine 25 MG tablet Commonly known as: ATARAX/VISTARIL Take 1 tablet (25 mg total) by mouth 3 (three) times daily as needed for itching (itching, rash).  Indication: Feeling Anxious   traZODone 50 MG tablet Commonly known as: DESYREL Take 1 tablet (50 mg total) by mouth at bedtime as needed for sleep.  Indication: [place] Follow up.   Why: Patient accepted into program. Patient is to report on 06/06/21 Contact information: [place] [id number] [phone]                 Follow-up recommendations:  Activity:    Diet:  regular Other:  see discharge  plan  Comments: Patient reviewed with inpatient treatment team.  Team and patient agreeable to current discharge plan.  Signed: Alethia Berthold, MD 06/06/2021, 11:20 AM

## 2021-06-06 NOTE — BHH Suicide Risk Assessment (Signed)
[facility] Discharge Suicide Risk Assessment   Principal Problem: Bipolar I disorder, most recent episode depressed, severe without psychotic features ([place]) Discharge Diagnoses: Principal Problem:   Bipolar I disorder, most recent episode depressed, severe without psychotic features ([place]) Active Problems:   Tobacco use disorder   Amphetamine use disorder, severe (HCC)   Opioid use disorder, severe, dependence ([place])   Cannabis use disorder, severe, dependence ([place])   Alcohol abuse   Total Time spent with patient: 30 minutes  Musculoskeletal: Strength & Muscle Tone: within normal limits Gait & Station: normal Patient leans: N/A  Psychiatric Specialty Exam  Presentation  General Appearance: Appropriate for Environment  Eye Contact:Good  Speech:Clear and Coherent  Speech Volume:Normal  Handedness:Right   Mood and Affect  Mood:Depressed  Duration of Depression Symptoms: Greater than two weeks  Affect:Flat; Congruent   Thought Process  Thought Processes:Coherent  Descriptions of Associations:Intact  Orientation:Full (Time, Place and Person)  Thought Content:WDL  History of Schizophrenia/Schizoaffective disorder:No  Duration of Psychotic Symptoms:No data recorded Hallucinations:No data recorded Ideas of Reference:None (Denies)  Suicidal Thoughts:No data recorded Homicidal Thoughts:No data recorded  Sensorium  Memory:Immediate Good; Recent Good  Judgment:Good (In the hospital)  Insight:Good   Executive Functions  Concentration:Fair  Attention Span:Good  [place] of Knowledge:Fair  Language:Fair   Psychomotor Activity  Psychomotor Activity: No data recorded  Assets  Assets:Communication Skills; Desire for Improvement; Financial Resources/Insurance; Housing; Resilience; Physical Health   Sleep  Sleep: No data recorded  Physical Exam: Physical Exam Vitals and nursing note reviewed.  Constitutional:      Appearance: Normal  appearance.  HENT:     Head: Normocephalic and atraumatic.     Mouth/Throat:     Pharynx: Oropharynx is clear.  Eyes:     Pupils: Pupils are equal, round, and reactive to light.  Cardiovascular:     Rate and Rhythm: Normal rate and regular rhythm.  Pulmonary:     Effort: Pulmonary effort is normal.     Breath sounds: Normal breath sounds.  Abdominal:     General: Abdomen is flat.     Palpations: Abdomen is soft.  Musculoskeletal:        General: Normal range of motion.  Skin:    General: Skin is warm and dry.  Neurological:     General: No focal deficit present.     Mental Status: She is alert. Mental status is at baseline.  Psychiatric:        Attention and Perception: Attention normal.        Mood and Affect: Mood is anxious. Affect is blunt.        Speech: Speech normal.        Behavior: Behavior is cooperative.        Thought Content: Thought content normal.        Cognition and Memory: Cognition normal.        Judgment: Judgment normal.   Review of Systems  Constitutional: Negative.   HENT: Negative.    Eyes: Negative.   Respiratory: Negative.    Cardiovascular: Negative.   Gastrointestinal: Negative.   Musculoskeletal: Negative.   Skin: Negative.   Neurological: Negative.   Psychiatric/Behavioral: Negative.    Blood pressure 93/69, pulse 85, temperature (!) 97.5 F (36.4 C), temperature source Oral, resp. rate 18, height '5\' 2"'$  (1.575 m), weight 46.7 kg, SpO2 100 %. Body mass index is 18.84 kg/m.  Mental Status Per Nursing Assessment::   On Admission:  NA (Simultaneous filing. User may not have seen  previous data.)  Demographic Factors:  Caucasian, Low socioeconomic status, and Unemployed  Loss Factors: Financial problems/change in socioeconomic status  Historical Factors: Prior suicide attempts and Impulsivity  Risk Reduction Factors:   Positive therapeutic relationship  Continued Clinical Symptoms:  Bipolar Disorder:   Mixed  State Alcohol/Substance Abuse/Dependencies  Cognitive Features That Contribute To Risk:  None    Suicide Risk:  Minimal: No identifiable suicidal ideation.  Patients presenting with no risk factors but with morbid ruminations; may be classified as minimal risk based on the severity of the depressive symptoms   Follow-up [place] Follow up.   Why: Patient accepted into program. Patient is to report on 06/06/21 Contact information: [place] [address] [phone]                 Plan Of Care/Follow-up recommendations:  Activity:  acticity as tolerated Diet:  regular Other:  conitinue medication and go to rehab [facility] treatment  Alethia Berthold, MD 06/06/2021, 11:16 AM

## 2021-06-06 NOTE — Progress Notes (Signed)
CSW contacted [employer] to see if there would be an issue with patient coming after 2:00 PM. CSW left a message.

## 2021-06-06 NOTE — Plan of Care (Signed)
  Care Planning Closed Out for Pending Discharge  Problem: Education: Goal: Ability to state activities that reduce stress will improve Outcome: Adequate for Discharge Problem: Coping: Goal: Ability to identify and develop effective coping behavior will improve Outcome: Adequate for Discharge Problem: Self-Concept: Goal: Level of anxiety will decrease Outcome: Adequate for Discharge Goal: Ability to modify response to factors that promote anxiety will improve Outcome: Adequate for Discharge Problem: Education: Goal: Utilization of techniques to improve thought processes will improve Outcome: Adequate for Discharge Goal: Knowledge of the prescribed therapeutic regimen will improve Outcome: Adequate for Discharge Problem: Coping: Goal: Coping ability will improve Outcome: Adequate for Discharge Problem: Education: Goal: Knowledge of [place] [facility] Education information/materials will improve Outcome: Adequate for Discharge Goal: Emotional status will improve Outcome: Adequate for Discharge Goal: Mental status will improve Outcome: Adequate for Discharge Goal: Verbalization of understanding the information provided will improve Outcome: Adequate for Discharge

## 2021-06-06 NOTE — Progress Notes (Signed)
CSW received a call back from [place] who told CSW that its fine that patient comes after 2 PM. Margarita Grizzle stated they have a 2:30, 3:30, 4:30, and 5:30 PM slot times. Margarita Grizzle stated she will place patient under the 3:30 PM for now.

## 2021-06-06 NOTE — Progress Notes (Signed)
  [facility] Adult Case Management Discharge Plan :  Will you be returning to the same living situation after discharge:  No. At discharge, do you have transportation home?: Yes,  Patient will be going to the [facility]  Do you have the ability to pay for your medications: Yes,     Release of information consent forms completed and in the chart;  [name]  Patient to Follow up at:  [place] Follow up.   Why: Patient accepted into program. Patient is to report on 06/06/21 Contact information: [place] [address] [phone]                 Next level of care provider has access to [place] and Suicide Prevention discussed: Yes,  SPE completed      Has patient been referred to the Quitline?: N/A patient is not a smoker  Patient has been referred for addiction treatment: Yes  Raina Mina, [place] 06/06/2021, 8:46 AM

## 2021-11-04 ENCOUNTER — Ambulatory Visit: Payer: Medicaid Other

## 2021-11-20 ENCOUNTER — Encounter: Payer: Self-pay | Admitting: Emergency Medicine

## 2021-11-20 ENCOUNTER — Emergency Department
Admission: EM | Admit: 2021-11-20 | Discharge: 2021-11-20 | Disposition: A | Discharge status: Home / Self Care | Payer: BC Managed Care – PPO | Attending: Emergency Medicine | Admitting: Emergency Medicine

## 2021-11-20 ENCOUNTER — Other Ambulatory Visit: Payer: Self-pay

## 2021-11-20 DIAGNOSIS — Z5321 Procedure and treatment not carried out due to patient leaving prior to being seen by health care provider: Secondary | ICD-10-CM | POA: Insufficient documentation

## 2021-11-20 DIAGNOSIS — R350 Frequency of micturition: Secondary | ICD-10-CM | POA: Insufficient documentation

## 2021-11-20 DIAGNOSIS — K047 Periapical abscess without sinus: Secondary | ICD-10-CM | POA: Insufficient documentation

## 2021-11-20 DIAGNOSIS — N898 Other specified noninflammatory disorders of vagina: Secondary | ICD-10-CM | POA: Insufficient documentation

## 2021-11-20 LAB — URINALYSIS, COMPLETE (UACMP) WITH MICROSCOPIC
Bilirubin Urine: NEGATIVE
Glucose, UA: NEGATIVE mg/dL
Hgb urine dipstick: NEGATIVE
Ketones, ur: NEGATIVE mg/dL
Leukocytes,Ua: NEGATIVE
Nitrite: NEGATIVE
Protein, ur: NEGATIVE mg/dL
Specific Gravity, Urine: 1.016 (ref 1.005–1.030)
WBC, UA: NONE SEEN WBC/hpf (ref 0–5)
pH: 9 — ABNORMAL HIGH (ref 5.0–8.0)

## 2021-11-20 LAB — POC URINE PREG, ED: Preg Test, Ur: NEGATIVE

## 2021-11-20 NOTE — ED Triage Notes (Signed)
C/O urinary frequency x 1 week.  Has been taking cipro 1 tab 2 times a day -- taken medication for 5-6 days.  Also c/o vaginal burring.  Also c/o right lower jaw dental abscess x 2 days.

## 2021-11-24 ENCOUNTER — Emergency Department
Admission: EM | Admit: 2021-11-24 | Discharge: 2021-11-24 | Disposition: A | Discharge status: Home / Self Care | Payer: Medicaid Other | Attending: Emergency Medicine | Admitting: Emergency Medicine

## 2021-11-24 ENCOUNTER — Other Ambulatory Visit: Payer: Self-pay

## 2021-11-24 ENCOUNTER — Encounter: Payer: Self-pay | Admitting: Emergency Medicine

## 2021-11-24 DIAGNOSIS — K0889 Other specified disorders of teeth and supporting structures: Secondary | ICD-10-CM | POA: Insufficient documentation

## 2021-11-24 DIAGNOSIS — Z202 Contact with and (suspected) exposure to infections with a predominantly sexual mode of transmission: Secondary | ICD-10-CM | POA: Insufficient documentation

## 2021-11-24 LAB — URINALYSIS, COMPLETE (UACMP) WITH MICROSCOPIC
Bilirubin Urine: NEGATIVE
Glucose, UA: NEGATIVE mg/dL
Ketones, ur: 20 mg/dL — AB
Leukocytes,Ua: NEGATIVE
Nitrite: NEGATIVE
Protein, ur: NEGATIVE mg/dL
RBC / HPF: >50 RBC/hpf — ABNORMAL HIGH (ref 0–5)
Specific Gravity, Urine: 1.016 (ref 1.005–1.030)
pH: 5 (ref 5.0–8.0)

## 2021-11-24 LAB — CHLAMYDIA/NGC RT PCR (ARMC ONLY)
Chlamydia Tr: NOT DETECTED
N gonorrhoeae: NOT DETECTED

## 2021-11-24 LAB — POC URINE PREG, ED: Preg Test, Ur: NEGATIVE

## 2021-11-24 MED ORDER — ONDANSETRON HCL 4 MG PO TABS: 4.0000 mg | ORAL_TABLET | Freq: Every day | ORAL | 0 refills | Status: DC | PRN

## 2021-11-24 MED ORDER — PENICILLIN V POTASSIUM 500 MG PO TABS: 500.0000 mg | ORAL_TABLET | Freq: Four times a day (QID) | ORAL | 0 refills | Status: DC

## 2021-11-24 MED ORDER — CEFTRIAXONE SODIUM 250 MG IJ SOLR
250.0000 mg | Freq: Once | INTRAMUSCULAR | Status: AC
  Administered 2021-11-24: 250 mg via INTRAMUSCULAR
  Filled 2021-11-24: qty 250

## 2021-11-24 MED ORDER — AZITHROMYCIN 500 MG PO TABS
1000.0000 mg | ORAL_TABLET | Freq: Once | ORAL | Status: AC
  Administered 2021-11-24: 1000 mg via ORAL
  Filled 2021-11-24: qty 2

## 2021-11-24 MED ORDER — ONDANSETRON 4 MG PO TBDP
4.0000 mg | ORAL_TABLET | Freq: Once | ORAL | Status: AC
  Administered 2021-11-24: 4 mg via ORAL
  Filled 2021-11-24: qty 1

## 2021-11-24 NOTE — ED Provider Notes (Signed)
[facility] Provider Note    Event Date/Time   First MD Initiated Contact with Patient 11/24/21 [phone]     (approximate)  History   Chief Complaint: SEXUALLY TRANSMITTED DISEASE  HPI  Autumn [name] is a 46 y.o. female with a past medical history of bipolar, presents to the emergency department for possible STD as well as dental pain.  According to the patient she recently found out that her sexual partner of hers tested positive for an STD but she does not know which one.  Patient denies any vaginal discharge but states she has had some mild burning at times.  States she recently started her period as well.  Also states she has been intermittently nauseated.  Patient has a secondary complaint states 1 week of right lower molar dental pain.  No known fever.  Physical Exam   Triage Vital Signs: ED Triage Vitals [11/24/21 0925]  Enc Vitals Group     BP 115/65     Pulse Rate 94     Resp 16     Temp 97.8 F (36.6 C)     Temp Source Oral     SpO2 98 %     Weight 98 lb (44.5 kg)     Height 5\' 1"  (1.549 m)     Head Circumference      Peak Flow      Pain Score 7     Pain Loc      Pain Edu?      Excl. in [place]?     Most recent vital signs: Vitals:   11/24/21 0925  BP: 115/65  Pulse: 94  Resp: 16  Temp: 97.8 F (36.6 C)  SpO2: 98%    General: Awake, no distress.  CV:  Good peripheral perfusion.  Regular rate and rhythm  Resp:  Normal effort.  Equal breath sounds bilaterally.  Abd:  No distention.  Soft, nontender.  No rebound or guarding. Other:  Patient does have mild tenderness to the base of the right lower molars.  No signs of abscess no fluctuance no drainage.   ED Results / Procedures / Treatments   MEDICATIONS ORDERED IN ED: Medications  azithromycin (ZITHROMAX) tablet 1,000 mg (has no administration in time range)  ondansetron (ZOFRAN-ODT) disintegrating tablet 4 mg (has no administration in time range)  cefTRIAXone (ROCEPHIN) injection  250 mg (has no administration in time range)     IMPRESSION / MDM / ASSESSMENT AND PLAN / ED COURSE  I reviewed the triage vital signs and the nursing notes.  Patient presents emergency department with multiple complaints.  First patient states his sexual partner of hers recently tested positive for an STD she is having some mild burning with urination would like to be treated for STDs.  Denies any vaginal discharge.  Patient's urinalysis does show red blood cells but the patient states she just darted her menstrual cycle.  We will send a gonorrhea/chlamydia test.  We will treat the patient with oral Zithromax and Rocephin intramuscularly in the emergency department.  Given the patient's dental pain we will cover with penicillin.  Patient agreeable to plan of care.  I discussed dental follow-up as well as PCP follow-up.  We will discharge with nausea medication given the patient's intermittent nausea as well.  Provided return precautions.    FINAL CLINICAL IMPRESSION(S) / ED DIAGNOSES   Dental pain STD evaluation  Rx / DC Orders   Penicillin VK Zofran  Note:  This document was prepared  using Systems analyst and may include unintentional dictation errors.   Harvest Dark, MD 11/24/21 1015

## 2021-11-24 NOTE — ED Notes (Signed)
See triage note  presents with lower abd pain  denies any vaginal discharge  but states she thinks she has PID   also states she may have an abscess to gumline

## 2021-11-24 NOTE — Discharge Instructions (Signed)
OPTIONS FOR DENTAL FOLLOW UP CARE ° °[place] Department of Health and Human Services - Local Safety Net Dental Clinics °http://www.ncdhhs.gov/dph/oralhealth/services/safetynetclinics.htm °  °[facility] (336-562-3123) ° °[facility] (919-933-9087) ° °[facility] (919-663-1744 ext [phone]) ° °[facility] ([phone]) ° °[facility] ([phone]) °This clinic caters to the indigent population and is on a lottery system. °Location: °[facility], [address], [place] °Clinic Hours: °Wednesdays from 6pm - 9pm, patients seen by a lottery system. °For dates, call or go to [email or web address].edu/[facility]ac/patients/[email or web address]-[email or web address] °Services: °Cleanings, fillings and simple extractions. °Payment Options: °DENTAL WORK IS FREE OF CHARGE. Bring proof of income or support. °Best way to get seen: °Arrive at 5:15 pm - this is a lottery, NOT first come/first serve, so arriving earlier will not increase your chances of being seen. °  °  °[facility] °[phone] °Select option 1 for emergencies °  °Location: °[facility], [address], [place] °Clinic Hours: °No walk-ins accepted - call the day before to schedule an appointment. °Check in times are 9:30 am and 1:30 pm. °Services: °Simple extractions, temporary fillings, pulpectomy/pulp debridement, uncomplicated abscess drainage. °Payment Options: °PAYMENT IS DUE AT THE TIME OF SERVICE.  Fee is usually $100-200, additional surgical procedures (e.g. abscess drainage) may be extra. °Cash, checks, Visa/MasterCard accepted.  Can file Medicaid if patient is covered for dental - patient should call case worker to check. °No discount for [facility] patients. °Best way to get seen: °MUST call the day before and get onto the schedule. Can usually be seen the next 1-2 days. No walk-ins accepted. °  °  °[facility] °[phone] °   °Location: °[facility], [address], [place] °Clinic Hours: °M, W, Th, F 8am or 1:30pm, Tues 9a or 1:30 - first come/first served. °Services: °Simple extractions, temporary fillings, uncomplicated abscess drainage.  You do not need to be an [place] resident. °Payment Options: °PAYMENT IS DUE AT THE TIME OF SERVICE. °Dental insurance, otherwise sliding scale - bring proof of income or support. °Depending on income and treatment needed, cost is usually $50-200. °Best way to get seen: °Arrive early as it is first come/first served. °  °  °[facility] °[phone] °  °Location: °[address] °Clinic Hours: °Mon-Thu 8a-5p °Services: °Most basic dental services including extractions and fillings. °Payment Options: °PAYMENT IS DUE AT THE TIME OF SERVICE. °Sliding scale, up to 50% off - bring proof if income or support. °Medicaid with dental option accepted. °Best way to get seen: °Call to schedule an appointment, can usually be seen within 2 weeks OR they will try to see walk-ins - show up at 8a or 2p (you may have to wait). °  °  °[facility] °919-245-2435 °[place] RESIDENTS ONLY °  °Location: °[facility], [address], [place], [place] [address] °Clinic Hours: By appointment only. °Monday - Thursday 8am-5pm, Friday 8am-12pm °Services: Cleanings, fillings, extractions. °Payment Options: °PAYMENT IS DUE AT THE TIME OF SERVICE. °Cash, Visa or MasterCard. Sliding scale - $30 minimum per service. °Best way to get seen: °Come in to office, complete packet and make an appointment - need proof of income °or support monies for each household member and proof of [place] residence. °Usually takes about a month to get in. °  °  °[facility] °[phone] °  °Location: °[address]   [place] °Clinic Hours: Walk-in Urgent Care Dental Services are offered Monday-Friday  mornings only. °The numbers of emergencies accepted daily is limited to the number of °providers available. °Maximum 15 - Mondays, Wednesdays & Thursdays °Maximum 10 - Tuesdays & Fridays °Services: °You do not need to be a [place] resident to be seen for a dental emergency. °Emergencies are defined as pain, swelling, abnormal bleeding, or dental trauma. Walkins will receive x-rays if needed. °NOTE: Dental cleaning is not an emergency. °Payment Options: °PAYMENT IS DUE AT THE TIME OF SERVICE. °Minimum co-pay is $40.00 for uninsured patients. °Minimum co-pay is $3.00 for Medicaid with dental coverage. °Dental Insurance is accepted and must be presented at time of visit. °Medicare does not cover dental. °Forms of payment: Cash, credit card, checks. °Best way to get seen: °If not previously registered with the clinic, walk-in dental registration begins at 7:15 am and is on a first come/first serve basis. °If previously registered with the clinic, call to make an appointment. °  °  °[facility] °[phone] °[place] RESIDENTS ONLY °  °Location: °[address], [place], [place] °Clinic Hours: °Mon-Thu 10a-2p °Services: Extractions only! °Payment Options: °FREE (donations accepted) - bring proof of income or support °Best way to get seen: °Call and schedule an appointment OR come at 8am on the 1st Monday of every month (except for holidays) when it is first come/first served. °  °  °[facility] °[phone] °  °Location: °[address], [place] °Clinic Hours: °Friday mornings °Services, Payment Options, Best way to get seen: °Call for info °

## 2021-11-24 NOTE — ED Triage Notes (Signed)
Pt here with a possible STI. Pt states it burns when she urinates and her lower abd is in pain. Pt has had STIs in the past. Pt also c/o of a abscess on her lower right gum that is causing her pain. Pt in NAD in triage.

## 2021-12-24 ENCOUNTER — Emergency Department
Admission: EM | Admit: 2021-12-24 | Discharge: 2021-12-25 | Disposition: A | Discharge status: Other Acute Inpatient Hospital | Payer: 59 | Attending: Emergency Medicine | Admitting: Emergency Medicine

## 2021-12-24 DIAGNOSIS — F319 Bipolar disorder, unspecified: Secondary | ICD-10-CM | POA: Diagnosis present

## 2021-12-24 DIAGNOSIS — F17210 Nicotine dependence, cigarettes, uncomplicated: Secondary | ICD-10-CM | POA: Insufficient documentation

## 2021-12-24 DIAGNOSIS — F3110 Bipolar disorder, current episode manic without psychotic features, unspecified: Secondary | ICD-10-CM | POA: Diagnosis present

## 2021-12-24 DIAGNOSIS — F29 Unspecified psychosis not due to a substance or known physiological condition: Secondary | ICD-10-CM | POA: Insufficient documentation

## 2021-12-24 DIAGNOSIS — F17200 Nicotine dependence, unspecified, uncomplicated: Secondary | ICD-10-CM | POA: Diagnosis present

## 2021-12-24 DIAGNOSIS — F1410 Cocaine abuse, uncomplicated: Secondary | ICD-10-CM

## 2021-12-24 DIAGNOSIS — F3112 Bipolar disorder, current episode manic without psychotic features, moderate: Secondary | ICD-10-CM | POA: Diagnosis present

## 2021-12-24 DIAGNOSIS — R059 Cough, unspecified: Secondary | ICD-10-CM

## 2021-12-24 DIAGNOSIS — F332 Major depressive disorder, recurrent severe without psychotic features: Secondary | ICD-10-CM | POA: Diagnosis present

## 2021-12-24 DIAGNOSIS — Z20822 Contact with and (suspected) exposure to covid-19: Secondary | ICD-10-CM | POA: Insufficient documentation

## 2021-12-24 DIAGNOSIS — R4689 Other symptoms and signs involving appearance and behavior: Secondary | ICD-10-CM

## 2021-12-24 LAB — RESP PANEL BY RT-PCR (FLU A&B, COVID) ARPGX2
Influenza A by PCR: NEGATIVE
Influenza B by PCR: NEGATIVE
SARS Coronavirus 2 by RT PCR: NEGATIVE

## 2021-12-24 LAB — COMPREHENSIVE METABOLIC PANEL
ALT: 14 U/L (ref 0–44)
AST: 13 U/L — ABNORMAL LOW (ref 15–41)
Albumin: 3.7 g/dL (ref 3.5–5.0)
Alkaline Phosphatase: 43 U/L (ref 38–126)
Anion gap: 6 (ref 5–15)
BUN: 13 mg/dL (ref 6–20)
CO2: 25 mmol/L (ref 22–32)
Calcium: 8.6 mg/dL — ABNORMAL LOW (ref 8.9–10.3)
Chloride: 107 mmol/L (ref 98–111)
Creatinine, Ser: 0.39 mg/dL — ABNORMAL LOW (ref 0.44–1.00)
GFR, Estimated: >60 mL/min (ref >60)
Glucose, Bld: 73 mg/dL (ref 70–99)
Potassium: 3.2 mmol/L — ABNORMAL LOW (ref 3.5–5.1)
Sodium: 138 mmol/L (ref 135–145)
Total Bilirubin: 0.7 mg/dL (ref 0.3–1.2)
Total Protein: 5.9 g/dL — ABNORMAL LOW (ref 6.5–8.1)

## 2021-12-24 LAB — CBC WITH DIFFERENTIAL/PLATELET
Abs Immature Granulocytes: 0.03 10*3/uL (ref 0.00–0.07)
Basophils Absolute: 0 10*3/uL (ref 0.0–0.1)
Basophils Relative: 0 %
Eosinophils Absolute: 0.1 10*3/uL (ref 0.0–0.5)
Eosinophils Relative: 1 %
HCT: 36.6 % (ref 36.0–46.0)
Hemoglobin: 12.1 g/dL (ref 12.0–15.0)
Immature Granulocytes: 0 %
Lymphocytes Relative: 23 %
Lymphs Abs: 1.8 10*3/uL (ref 0.7–4.0)
MCH: 30.3 pg (ref 26.0–34.0)
MCHC: 33.1 g/dL (ref 30.0–36.0)
MCV: 91.5 fL (ref 80.0–100.0)
Monocytes Absolute: 0.6 10*3/uL (ref 0.1–1.0)
Monocytes Relative: 7 %
Neutro Abs: 5.6 10*3/uL (ref 1.7–7.7)
Neutrophils Relative %: 69 %
Platelets: 224 10*3/uL (ref 150–400)
RBC: 4 MIL/uL (ref 3.87–5.11)
RDW: 12.3 % (ref 11.5–15.5)
WBC: 8.2 10*3/uL (ref 4.0–10.5)
nRBC: 0 % (ref 0.0–0.2)

## 2021-12-24 LAB — URINE DRUG SCREEN, QUALITATIVE (ARMC ONLY)
Amphetamines, Ur Screen: NOT DETECTED
Barbiturates, Ur Screen: NOT DETECTED
Benzodiazepine, Ur Scrn: NOT DETECTED
Cannabinoid 50 Ng, Ur ~~LOC~~: NOT DETECTED
Cocaine Metabolite,Ur ~~LOC~~: POSITIVE — AB
MDMA (Ecstasy)Ur Screen: NOT DETECTED
Methadone Scn, Ur: NOT DETECTED
Opiate, Ur Screen: NOT DETECTED
Phencyclidine (PCP) Ur S: NOT DETECTED
Tricyclic, Ur Screen: NOT DETECTED

## 2021-12-24 LAB — SALICYLATE LEVEL: Salicylate Lvl: <7 mg/dL — ABNORMAL LOW (ref 7.0–30.0)

## 2021-12-24 LAB — POC URINE PREG, ED: Preg Test, Ur: NEGATIVE

## 2021-12-24 LAB — ETHANOL: Alcohol, Ethyl (B): <10 mg/dL (ref <10)

## 2021-12-24 LAB — ACETAMINOPHEN LEVEL: Acetaminophen (Tylenol), Serum: <10 ug/mL — ABNORMAL LOW (ref 10–30)

## 2021-12-24 MED ORDER — LORAZEPAM 0.5 MG PO TABS
0.5000 mg | ORAL_TABLET | Freq: Once | ORAL | Status: AC
Start: 2021-12-24 — End: 2021-12-24
  Administered 2021-12-24: 0.5 mg via ORAL
  Filled 2021-12-24: qty 1

## 2021-12-24 MED ORDER — LORAZEPAM 2 MG/ML IJ SOLN
2.0000 mg | Freq: Once | INTRAMUSCULAR | Status: AC
  Administered 2021-12-24: 2 mg via INTRAMUSCULAR
  Filled 2021-12-24: qty 1

## 2021-12-24 MED ORDER — NICOTINE 14 MG/24HR TD PT24
14.0000 mg | MEDICATED_PATCH | Freq: Once | TRANSDERMAL | Status: DC
  Administered 2021-12-24: 14 mg via TRANSDERMAL
  Filled 2021-12-24: qty 1

## 2021-12-24 MED ORDER — HALOPERIDOL LACTATE 5 MG/ML IJ SOLN
5.0000 mg | Freq: Once | INTRAMUSCULAR | Status: AC
  Administered 2021-12-24: 5 mg via INTRAMUSCULAR
  Filled 2021-12-24: qty 1

## 2021-12-24 MED ORDER — DIPHENHYDRAMINE HCL 50 MG/ML IJ SOLN
50.0000 mg | Freq: Once | INTRAMUSCULAR | Status: AC
  Administered 2021-12-24: 50 mg via INTRAMUSCULAR
  Filled 2021-12-24: qty 1
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
## 2021-12-24 NOTE — ED Notes (Signed)
Report received from Kelly, RN including SBAR. Patient alert and oriented, warm and dry, and in no acute distress. Patient denies SI, HI, AVH and pain. Patient made aware of Q15 minute rounds and Rover and Officer presence for their safety. Patient instructed to come to this nurse with needs or concerns.  

## 2021-12-24 NOTE — ED Notes (Signed)
Pt eating during assessment, pt cold-blanket provided. Pt uninterested but cooperative

## 2021-12-24 NOTE — ED Notes (Signed)
Refusing EKG and bloodwork at this time
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
## 2021-12-24 NOTE — ED Notes (Signed)
Pt out of room and walking to bathroom naked. This RN and EDT back in room with pt. Pt provided with scrub pants, top and underwear. Pt with blood soiled bed linens and pants laying in floor of room. Pt offered menstrual pad but pt declined at this time. Pt allowing this RN to update vitals.
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
## 2021-12-24 NOTE — ED Notes (Signed)
Dinner at bedside, pt currently sleeping.

## 2021-12-24 NOTE — ED Notes (Signed)
Lunch placed at bedside

## 2021-12-24 NOTE — ED Notes (Addendum)
[place] [employer] [place] gas station under IVC. Pt found in gas station by PD naked, stated that she had her daughter inside of her and that she is "[place]' hoe". Pt with hx of mental illness, has not been  taking her medications. On arrival pt naked yelling, manic and unable to be redirected, refusing blood draw.

## 2021-12-24 NOTE — ED Notes (Signed)
IVC pending consult   
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
## 2021-12-24 NOTE — ED Notes (Signed)
Refused vital signs

## 2021-12-24 NOTE — ED Notes (Signed)
Belongings include 2 socks, 1 white appearing stud earring with no back placed in labeled belonging bag.

## 2021-12-25 ENCOUNTER — Emergency Department: Payer: Self-pay

## 2021-12-25 ENCOUNTER — Inpatient Hospital Stay
Admission: AD | Admit: 2021-12-25 | Discharge: 2022-01-08 | DRG: 885 | Disposition: A | Discharge status: Home / Self Care | Payer: 59 | Source: Intra-hospital | Attending: Psychiatry | Admitting: Psychiatry

## 2021-12-25 DIAGNOSIS — F312 Bipolar disorder, current episode manic severe with psychotic features: Secondary | ICD-10-CM | POA: Diagnosis not present

## 2021-12-25 DIAGNOSIS — Z888 Allergy status to other drugs, medicaments and biological substances status: Secondary | ICD-10-CM

## 2021-12-25 DIAGNOSIS — F411 Generalized anxiety disorder: Secondary | ICD-10-CM | POA: Diagnosis present

## 2021-12-25 DIAGNOSIS — H409 Unspecified glaucoma: Secondary | ICD-10-CM | POA: Diagnosis present

## 2021-12-25 DIAGNOSIS — F1490 Cocaine use, unspecified, uncomplicated: Secondary | ICD-10-CM | POA: Diagnosis present

## 2021-12-25 DIAGNOSIS — Z885 Allergy status to narcotic agent status: Secondary | ICD-10-CM

## 2021-12-25 DIAGNOSIS — F1410 Cocaine abuse, uncomplicated: Secondary | ICD-10-CM

## 2021-12-25 DIAGNOSIS — F1520 Other stimulant dependence, uncomplicated: Secondary | ICD-10-CM | POA: Diagnosis not present

## 2021-12-25 DIAGNOSIS — F1590 Other stimulant use, unspecified, uncomplicated: Secondary | ICD-10-CM | POA: Diagnosis present

## 2021-12-25 DIAGNOSIS — Z635 Disruption of family by separation and divorce: Secondary | ICD-10-CM

## 2021-12-25 DIAGNOSIS — F17290 Nicotine dependence, other tobacco product, uncomplicated: Secondary | ICD-10-CM | POA: Diagnosis present

## 2021-12-25 DIAGNOSIS — F3112 Bipolar disorder, current episode manic without psychotic features, moderate: Secondary | ICD-10-CM | POA: Diagnosis not present

## 2021-12-25 DIAGNOSIS — F17210 Nicotine dependence, cigarettes, uncomplicated: Secondary | ICD-10-CM | POA: Diagnosis present

## 2021-12-25 DIAGNOSIS — F3132 Bipolar disorder, current episode depressed, moderate: Secondary | ICD-10-CM | POA: Diagnosis present

## 2021-12-25 DIAGNOSIS — F19959 Other psychoactive substance use, unspecified with psychoactive substance-induced psychotic disorder, unspecified: Secondary | ICD-10-CM | POA: Diagnosis not present

## 2021-12-25 DIAGNOSIS — Z9114 Patient's other noncompliance with medication regimen: Secondary | ICD-10-CM

## 2021-12-25 LAB — LIPID PANEL
Cholesterol: 160 mg/dL (ref 0–200)
HDL: 58 mg/dL (ref >40)
LDL Cholesterol: 83 mg/dL (ref 0–99)
Total CHOL/HDL Ratio: 2.8 RATIO
Triglycerides: 95 mg/dL (ref <150)
VLDL: 19 mg/dL (ref 0–40)

## 2021-12-25 MED ORDER — BENZTROPINE MESYLATE 1 MG PO TABS
1.0000 mg | ORAL_TABLET | Freq: Every day | ORAL | Status: DC
  Administered 2021-12-25: 1 mg via ORAL
  Filled 2021-12-25: qty 1

## 2021-12-25 MED ORDER — LORAZEPAM 1 MG PO TABS
1.0000 mg | ORAL_TABLET | Freq: Once | ORAL | Status: AC
Start: 2021-12-25 — End: 2021-12-25
  Administered 2021-12-25: 1 mg via ORAL
  Filled 2021-12-25: qty 1

## 2021-12-25 MED ORDER — ARIPIPRAZOLE 10 MG PO TABS
20.0000 mg | ORAL_TABLET | Freq: Every day | ORAL | Status: DC
  Administered 2021-12-26 – 2021-12-28 (×3): 20 mg via ORAL
  Filled 2021-12-25 (×3): qty 2

## 2021-12-25 MED ORDER — NICOTINE 21 MG/24HR TD PT24
21.0000 mg | MEDICATED_PATCH | Freq: Every day | TRANSDERMAL | Status: DC
Start: 2021-12-26 — End: 2022-01-08
  Administered 2021-12-26 – 2022-01-08 (×14): 21 mg via TRANSDERMAL
  Filled 2021-12-25 (×13): qty 1

## 2021-12-25 MED ORDER — LITHIUM CARBONATE ER 450 MG PO TBCR
450.0000 mg | EXTENDED_RELEASE_TABLET | Freq: Two times a day (BID) | ORAL | Status: DC
  Filled 2021-12-25: qty 1

## 2021-12-25 MED ORDER — ARIPIPRAZOLE 10 MG PO TABS
20.0000 mg | ORAL_TABLET | Freq: Every day | ORAL | Status: DC
  Administered 2021-12-25: 20 mg via ORAL
  Filled 2021-12-25: qty 2

## 2021-12-25 MED ORDER — POTASSIUM CHLORIDE 20 MEQ PO PACK
40.0000 meq | PACK | Freq: Every day | ORAL | Status: DC
  Filled 2021-12-25: qty 2

## 2021-12-25 MED ORDER — DIVALPROEX SODIUM 500 MG PO DR TAB: 500.0000 mg | DELAYED_RELEASE_TABLET | Freq: Two times a day (BID) | ORAL | Status: DC

## 2021-12-25 MED ORDER — DIVALPROEX SODIUM 500 MG PO DR TAB
500.0000 mg | DELAYED_RELEASE_TABLET | Freq: Two times a day (BID) | ORAL | Status: DC
Start: 2021-12-25 — End: 2021-12-31
  Administered 2021-12-25 – 2021-12-31 (×12): 500 mg via ORAL
  Filled 2021-12-25 (×12): qty 1

## 2021-12-25 MED ORDER — POTASSIUM CHLORIDE CRYS ER 20 MEQ PO TBCR
40.0000 meq | EXTENDED_RELEASE_TABLET | Freq: Once | ORAL | Status: DC
Start: 2021-12-25 — End: 2021-12-25

## 2021-12-25 MED ORDER — LITHIUM CARBONATE ER 450 MG PO TBCR
450.0000 mg | EXTENDED_RELEASE_TABLET | Freq: Two times a day (BID) | ORAL | Status: DC
  Administered 2021-12-25 – 2021-12-31 (×12): 450 mg via ORAL
  Filled 2021-12-25 (×12): qty 1

## 2021-12-25 MED ORDER — ALUM & MAG HYDROXIDE-SIMETH 200-200-20 MG/5ML PO SUSP: 30.0000 mL | ORAL | Status: DC | PRN

## 2021-12-25 MED ORDER — BENZTROPINE MESYLATE 1 MG PO TABS
1.0000 mg | ORAL_TABLET | Freq: Every day | ORAL | Status: DC
  Administered 2021-12-26 – 2022-01-07 (×12): 1 mg via ORAL
  Filled 2021-12-25 (×13): qty 1

## 2021-12-25 MED ORDER — MAGNESIUM HYDROXIDE 400 MG/5ML PO SUSP: 30.0000 mL | Freq: Every day | ORAL | Status: DC | PRN

## 2021-12-25 MED ORDER — ACETAMINOPHEN 325 MG PO TABS
650.0000 mg | ORAL_TABLET | Freq: Four times a day (QID) | ORAL | Status: DC | PRN
  Administered 2021-12-26 – 2022-01-08 (×30): 650 mg via ORAL
  Filled 2021-12-25 (×32): qty 2

## 2021-12-25 NOTE — ED Notes (Signed)
Pt states she bit her thumb on accident, is bleeding. Pt given bandaid, voicing no other complaints at this time.

## 2021-12-25 NOTE — ED Notes (Signed)
Pt refused shower. Refused clean underwear. Refusing to wear a pad as she is on her period.

## 2021-12-25 NOTE — ED Provider Notes (Signed)
Emergency Medicine Observation Re-evaluation Note  Autumn [name] is a 46 y.o. female, seen on rounds today.  Pt initially presented to the ED for complaints of Manic Behavior Currently, the patient is resting.  Physical Exam  BP 103/63 (BP Location: Right Arm)    Pulse (!) 103    Temp 97.7 F (36.5 C) (Oral)    Resp 17    SpO2 100%  Physical Exam Gen: No acute distress  Resp: Normal rise and fall of chest Neuro: Moving all four extremities Psych: Resting currently, agitated and manic when awake    ED Course / MDM  EKG:EKG Interpretation  Date/Time:  Thursday December 24 2021 06:24:47 EST Ventricular Rate:  77 PR Interval:  110 QRS Duration: 78 QT Interval:  384 QTC Calculation: 434 R Axis:   53 Text Interpretation: Sinus rhythm with short PR Low voltage QRS Borderline ECG When compared with ECG of 16-Feb-2021 15:14, No significant change was found Confirmed by UNCONFIRMED, DOCTOR ([id number]), editor Dwaine Deter ([id number]) on 12/24/2021 12:30:01 PM  I have reviewed the labs performed to date as well as medications administered while in observation.  Recent changes in the last 24 hours include no acute events overnight.  Plan  Current plan is for inpatient psychiatric treatment. Autumn [name] is under involuntary commitment.      Autumn [name], Delice Bison, DO 12/25/21 [phone]

## 2021-12-25 NOTE — ED Notes (Signed)
IVC/pending admit to [facility]

## 2021-12-25 NOTE — ED Notes (Signed)
This RN called [facility] to see what the delay was in accepting their patient. RN Skyler advised they were too busy to take her right now. Night shift would have to take her.

## 2021-12-25 NOTE — ED Notes (Signed)

## 2021-12-25 NOTE — ED Notes (Signed)
Pt given dinner tray.
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
## 2021-12-25 NOTE — ED Notes (Signed)
Pt was agitated and kept going out of her room. Advised ED physician of pt's behavior and the order was placed for 1mg  of ativan orally. Pt was administered the medication without complications.

## 2021-12-25 NOTE — ED Notes (Signed)
LUNCH TRAY GIVEN. NO OTHER NEEDS FOUND AT THIS MOMENT.

## 2021-12-25 NOTE — ED Notes (Signed)
[facility] called for report. They will cal, back when ready to transfer patient downstairs.

## 2021-12-25 NOTE — BH Assessment (Signed)
Patient to be reviewed with [facility]

## 2021-12-25 NOTE — Consult Note (Signed)
[place] [facility] Consult   Reason for Consult: Consult following up with this 46 year old woman with a known history of bipolar disorder and substance abuse Referring Physician: Quentin Cornwall Patient Identification: Autumn [name] MRN:  [id number] Principal Diagnosis: Bipolar affective disorder, current episode manic ([place]) Diagnosis:  Principal Problem:   Bipolar affective disorder, current episode manic ([place]) Active Problems:   Tobacco use disorder   Affective psychosis, bipolar ([place])   Bipolar I disorder, most recent episode (or current) manic ([place])   Bipolar 1 disorder, manic, moderate ([place])   MDD (major depressive disorder), recurrent severe, without psychosis ([place])   MDD (major depressive disorder), recurrent episode, severe ([place])   Cocaine abuse ([place])   Total Time spent with patient: 1 hour  Subjective:   Barabara [name] is a 46 y.o. female patient admitted with "I know I have let myself get out of control".  HPI: Patient seen and chart reviewed.  Patient known from previous encounters.  46 year old woman with history of bipolar disorder and substance abuse came into the emergency room extremely agitated with descriptions of bizarre psychotic driven behavior.  She had been given medication and has now rested and was capable of having a conversation.  Patient tells me that ever since she got out of her last substance abuse rehab she has not taken any medication and has not followed up with any outpatient treatment.  She says recently she has been binging on drugs.  She claims she has been using cocaine and meth.  Drug screen is just positive for cocaine.  When she came into the emergency room she was documented as being grossly psychotic and agitated.  Patient says she has very shaky memories of what has been happening recently.  Not sure exactly where she has been staying.  Think she still has a room in a boardinghouse but has not been staying there most of the time.  Patient is  currently tearful withdrawn and requesting admission.  She denies any current hallucinations now but admits that she has had some recently.  Denies suicidal ideation denies homicidal ideation.  Past Psychiatric History: Past history of bipolar disorder with multiple presentations often with mania in the context of substance abuse.  Patient gets psychotic when she is in the grip of her bipolar disorder.  Unfortunately also has a history of noncompliance with treatment in the past.  Has had suicidal behavior in the past.  Risk to Self:   Risk to Others:   Prior Inpatient Therapy:   Prior Outpatient Therapy:    Past Medical History:  Past Medical History:  Diagnosis Date   Allergy    Seasonal, Ultram (seizures)   Anemia 2005   Bipolar 1 disorder ([place])    Glaucoma    Seizures ([place]) 2008    Past Surgical History:  Procedure Laterality Date   BREAST EXCISIONAL BIOPSY Right    x 2   CESAREAN SECTION     x 2   LEEP  2000   Family History:  Family History  Problem Relation Age of Onset   Hypertension Mother    Hyperlipidemia Mother    Asthma Mother    Parkinson's disease Father    Lung cancer Maternal Grandmother    Other Paternal Grandfather 51       Cardiac arrest   Heart disease Paternal Grandfather    Tourette syndrome Daughter    Family Psychiatric  History: Positive for mood disorder Social History:  Social History   Substance and Sexual Activity  Alcohol  Use Yes   Alcohol/week: 4.0 standard drinks   Types: 4 Cans of beer per week   Comment: Ocassional     Social History   Substance and Sexual Activity  Drug Use Yes   Types: Marijuana   Comment: percocet -last use over 1 week ago. Heroin-last used 2/11    Social History   Socioeconomic History   Marital status: Legally Separated    Spouse name: Not on file   Number of children: Not on file   Years of education: Not on file   Highest education level: Not on file  Occupational History   Not on file   Tobacco Use   Smoking status: Every Day    Packs/day: 1.00    Years: 9.00    Pack years: 9.00    Types: E-cigarettes, Cigarettes   Smokeless tobacco: Never  Vaping Use   Vaping Use: Never used  Substance and Sexual Activity   Alcohol use: Yes    Alcohol/week: 4.0 standard drinks    Types: 4 Cans of beer per week    Comment: Ocassional   Drug use: Yes    Types: Marijuana    Comment: percocet -last use over 1 week ago. Heroin-last used 2/11   Sexual activity: Yes    Birth control/protection: None  Other Topics Concern   Not on file  Social History Narrative   Not on file   Social Determinants of Health   Financial Resource Strain: Not on file  Food Insecurity: Not on file  Transportation Needs: Not on file  Physical Activity: Not on file  Stress: Not on file  Social Connections: Not on file   Additional Social History:    Allergies:   Allergies  Allergen Reactions   Haldol [Haloperidol Lactate]     Patient states that she gets stiff muscles when taking Haldol   Ultram [Tramadol] Other (See Comments)    Seizures   Ziprasidone Hcl     Other reaction(s): Other (See Comments) PER PT WHEN SHE TAKES THIS SHE CANT MOVE HER NECK    Labs:  Results for orders placed or performed during the hospital encounter of 12/24/21 (from the past 48 hour(s))  Resp Panel by RT-PCR (Flu A&B, Covid) Nasopharyngeal Swab     Status: None   Collection Time: 12/24/21  5:39 AM   Specimen: Nasopharyngeal Swab; Nasopharyngeal(NP) swabs in vial transport medium  Result Value Ref Range   SARS Coronavirus 2 by RT PCR NEGATIVE NEGATIVE    Comment: (NOTE) SARS-CoV-2 target nucleic acids are NOT DETECTED.  The SARS-CoV-2 RNA is generally detectable in upper respiratory specimens during the acute phase of infection. The lowest concentration of SARS-CoV-2 viral copies this assay can detect is 138 copies/mL. A negative result does not preclude SARS-Cov-2 infection and should not be used as the  sole basis for treatment or other patient management decisions. A negative result may occur with  improper specimen collection/handling, submission of specimen other than nasopharyngeal swab, presence of viral mutation(s) within the areas targeted by this assay, and inadequate number of viral copies(<138 copies/mL). A negative result must be combined with clinical observations, patient history, and epidemiological information. The expected result is Negative.  Fact Sheet for Patients:  EntrepreneurPulse.com.au  Fact Sheet for Healthcare Providers:  IncredibleEmployment.be  This test is no t yet approved or cleared by the [place] FDA and  has been authorized for detection and/or diagnosis of SARS-CoV-2 by FDA under an Emergency Use Authorization (EUA). This EUA will remain  in effect (meaning this test can be used) for the duration of the COVID-19 declaration under Section 564(b)(1) of the Act, 21 U.S.C.section 360bbb-3(b)(1), unless the authorization is terminated  or revoked sooner.       Influenza A by PCR NEGATIVE NEGATIVE   Influenza B by PCR NEGATIVE NEGATIVE    Comment: (NOTE) The Xpert Xpress SARS-CoV-2/FLU/RSV plus assay is intended as an aid in the diagnosis of influenza from Nasopharyngeal swab specimens and should not be used as a sole basis for treatment. Nasal washings and aspirates are unacceptable for Xpert Xpress SARS-CoV-2/FLU/RSV testing.  Fact Sheet for Patients: EntrepreneurPulse.com.au  Fact Sheet for Healthcare Providers: IncredibleEmployment.be  This test is not yet approved or cleared by the [place] FDA and has been authorized for detection and/or diagnosis of SARS-CoV-2 by FDA under an Emergency Use Authorization (EUA). This EUA will remain in effect (meaning this test can be used) for the duration of the COVID-19 declaration under Section 564(b)(1) of the Act, 21  U.S.C. section 360bbb-3(b)(1), unless the authorization is terminated or revoked.  Performed at [facility], [address] [place], [place] [address]   Comprehensive metabolic panel     Status: Abnormal   Collection Time: 12/24/21  5:39 AM  Result Value Ref Range   Sodium 138 135 - 145 mmol/L   Potassium 3.2 (L) 3.5 - 5.1 mmol/L   Chloride 107 98 - 111 mmol/L   CO2 25 22 - 32 mmol/L   Glucose, Bld 73 70 - 99 mg/dL    Comment: Glucose reference range applies only to samples taken after fasting for at least 8 hours.   BUN 13 6 - 20 mg/dL   Creatinine, Ser 0.39 (L) 0.44 - 1.00 mg/dL   Calcium 8.6 (L) 8.9 - 10.3 mg/dL   Total Protein 5.9 (L) 6.5 - 8.1 g/dL   Albumin 3.7 3.5 - 5.0 g/dL   AST 13 (L) 15 - 41 U/L   ALT 14 0 - 44 U/L   Alkaline Phosphatase 43 38 - 126 U/L   Total Bilirubin 0.7 0.3 - 1.2 mg/dL   GFR, Estimated >60 >60 mL/min    Comment: (NOTE) Calculated using the CKD-EPI Creatinine Equation (2021)    Anion gap 6 5 - 15    Comment: Performed at [facility], [address] [place], [place] [address]  Ethanol     Status: None   Collection Time: 12/24/21  5:39 AM  Result Value Ref Range   Alcohol, Ethyl (B) <10 <10 mg/dL    Comment: (NOTE) Lowest detectable limit for serum alcohol is 10 mg/dL.  For medical purposes only. Performed at [facility], [address] [place],  [address]   Urine Drug Screen, Qualitative     Status: Abnormal   Collection Time: 12/24/21  5:39 AM  Result Value Ref Range   Tricyclic, Ur Screen NONE DETECTED NONE DETECTED   Amphetamines, Ur Screen NONE DETECTED NONE DETECTED   MDMA (Ecstasy)Ur Screen NONE DETECTED NONE DETECTED   Cocaine Metabolite,Ur [place] POSITIVE (A) NONE DETECTED   Opiate, Ur Screen NONE DETECTED NONE DETECTED   Phencyclidine (PCP) Ur S NONE DETECTED NONE DETECTED   Cannabinoid 50 Ng, Ur [place] NONE DETECTED NONE DETECTED   Barbiturates, Ur Screen NONE DETECTED NONE DETECTED    Benzodiazepine, Ur Scrn NONE DETECTED NONE DETECTED   Methadone Scn, Ur NONE DETECTED NONE DETECTED    Comment: (NOTE) Tricyclics + metabolites, urine    Cutoff 1000 ng/mL Amphetamines +  metabolites, urine  Cutoff 1000 ng/mL MDMA (Ecstasy), urine              Cutoff 500 ng/mL Cocaine Metabolite, urine          Cutoff 300 ng/mL Opiate + metabolites, urine        Cutoff 300 ng/mL Phencyclidine (PCP), urine         Cutoff 25 ng/mL Cannabinoid, urine                 Cutoff 50 ng/mL Barbiturates + metabolites, urine  Cutoff 200 ng/mL Benzodiazepine, urine              Cutoff 200 ng/mL Methadone, urine                   Cutoff 300 ng/mL  The urine drug screen provides only a preliminary, unconfirmed analytical test result and should not be used for non-medical purposes. Clinical consideration and professional judgment should be applied to any positive drug screen result due to possible interfering substances. A more specific alternate chemical method must be used in order to obtain a confirmed analytical result. Gas chromatography / mass spectrometry (GC/MS) is the preferred confirm atory method. Performed at [facility], [address] [place], [place] [address]   CBC with Diff     Status: None   Collection Time: 12/24/21  5:39 AM  Result Value Ref Range   WBC 8.2 4.0 - 10.5 K/uL   RBC 4.00 3.87 - 5.11 MIL/uL   Hemoglobin 12.1 12.0 - 15.0 g/dL   HCT 36.6 36.0 - 46.0 %   MCV 91.5 80.0 - 100.0 fL   MCH 30.3 26.0 - 34.0 pg   MCHC 33.1 30.0 - 36.0 g/dL   RDW 12.3 11.5 - 15.5 %   Platelets 224 150 - 400 K/uL   nRBC 0.0 0.0 - 0.2 %   Neutrophils Relative % 69 %   Neutro Abs 5.6 1.7 - 7.7 K/uL   Lymphocytes Relative 23 %   Lymphs Abs 1.8 0.7 - 4.0 K/uL   Monocytes Relative 7 %   Monocytes Absolute 0.6 0.1 - 1.0 K/uL   Eosinophils Relative 1 %   Eosinophils Absolute 0.1 0.0 - 0.5 K/uL   Basophils Relative 0 %   Basophils Absolute 0.0 0.0 - 0.1 K/uL   Immature  Granulocytes 0 %   Abs Immature Granulocytes 0.03 0.00 - 0.07 K/uL    Comment: Performed at [facility], [address] [place], [place] [address]  Acetaminophen level     Status: Abnormal   Collection Time: 12/24/21  5:39 AM  Result Value Ref Range   Acetaminophen (Tylenol), Serum <10 (L) 10 - 30 ug/mL    Comment: (NOTE) Therapeutic concentrations vary significantly. A range of 10-30 ug/mL  may be an effective concentration for many patients. However, some  are best treated at concentrations outside of this range. Acetaminophen concentrations >150 ug/mL at 4 hours after ingestion  and >50 ug/mL at 12 hours after ingestion are often associated with  toxic reactions.  Performed at [facility], [address] [place], [place] [address]   Salicylate level     Status: Abnormal   Collection Time: 12/24/21  5:39 AM  Result Value Ref Range   Salicylate Lvl <2.7 (L) 7.0 - 30.0 mg/dL    Comment: Performed at [facility], [address] [place], [place] [address]  POC urine preg, ED     Status: None  Collection Time: 12/24/21  5:41 AM  Result Value Ref Range   Preg Test, Ur NEGATIVE NEGATIVE    Comment:        THE SENSITIVITY OF THIS METHODOLOGY IS >24 mIU/mL     Current Facility-Administered Medications  Medication Dose Route Frequency Provider Last Rate Last Admin   nicotine (NICODERM CQ - dosed in mg/24 hours) patch 14 mg  14 mg Transdermal Once Vanessa [place], MD   14 mg at 12/24/21 2136   Current Outpatient Medications  Medication Sig Dispense Refill   ARIPiprazole (ABILIFY) 10 MG tablet Take 1 tablet (10 mg total) by mouth daily. (Patient not taking: Reported on 12/24/2021) 30 tablet 1   divalproex (DEPAKOTE) 250 MG DR tablet Take 1 tablet (250 mg total) by mouth in the morning. (Patient not taking: Reported on 12/24/2021) 30 tablet 1   divalproex (DEPAKOTE) 500 MG DR tablet Take 1 tablet (500 mg total) by mouth at bedtime. (Patient not taking:  Reported on 12/24/2021) 30 tablet 1   hydrOXYzine (ATARAX/VISTARIL) 25 MG tablet Take 1 tablet (25 mg total) by mouth 3 (three) times daily as needed for itching (itching, rash). (Patient not taking: Reported on 12/24/2021) 60 tablet 1   traZODone (DESYREL) 50 MG tablet Take 1 tablet (50 mg total) by mouth at bedtime as needed for sleep. (Patient not taking: Reported on 12/24/2021) 30 tablet 1    Musculoskeletal: Strength & Muscle Tone: within normal limits Gait & Station: normal Patient leans: N/A            Psychiatric Specialty Exam:  Presentation  General Appearance: Bizarre; Disheveled  Eye Contact:Good  Speech:Pressured; Slurred  Speech Volume:Decreased  Handedness:Right   Mood and Affect  Mood:Euphoric  Affect:Inappropriate; Full Range   Thought Process  Thought Processes:Disorganized  Descriptions of Associations:Tangential  Orientation:Partial  Thought Content:Obsessions; Paranoid Ideation; Illogical  History of Schizophrenia/Schizoaffective disorder:Yes  Duration of Psychotic Symptoms:Greater than six months  Hallucinations:Hallucinations: None  Ideas of Reference:Delusions  Suicidal Thoughts:Suicidal Thoughts: No  Homicidal Thoughts:Homicidal Thoughts: No   Sensorium  Memory:Immediate Poor; Recent Poor; Remote Poor  Judgment:Poor  Insight:Poor   Executive Functions  Concentration:Poor  Attention Span:Poor  Recall:Poor  Fund of Knowledge:Poor  Language:Poor   Psychomotor Activity  Psychomotor Activity:Psychomotor Activity: Normal   Assets  Assets:Communication Skills; Desire for Improvement; Resilience; Social Support   Sleep  Sleep:Sleep: Fair   Physical Exam: Physical Exam Vitals and nursing note reviewed.  Constitutional:      Appearance: Normal appearance.  HENT:     Head: Normocephalic and atraumatic.     Mouth/Throat:     Pharynx: Oropharynx is clear.  Eyes:     Pupils: Pupils are equal, round, and  reactive to light.  Cardiovascular:     Rate and Rhythm: Normal rate and regular rhythm.  Pulmonary:     Effort: Pulmonary effort is normal.     Breath sounds: Normal breath sounds.  Abdominal:     General: Abdomen is flat.     Palpations: Abdomen is soft.  Musculoskeletal:        General: Normal range of motion.  Skin:    General: Skin is warm and dry.  Neurological:     General: No focal deficit present.     Mental Status: She is alert. Mental status is at baseline.  Psychiatric:        Attention and Perception: She is inattentive.        Mood and Affect: Mood is anxious and depressed. Affect is  tearful.        Speech: Speech is tangential.        Behavior: Behavior is slowed and withdrawn.        Thought Content: Thought content is paranoid and delusional.        Cognition and Memory: Cognition is impaired. Memory is impaired.        Judgment: Judgment is impulsive.   Review of Systems  Constitutional: Negative.   HENT: Negative.    Eyes: Negative.   Respiratory: Negative.    Cardiovascular: Negative.   Gastrointestinal: Negative.   Musculoskeletal: Negative.   Skin: Negative.   Neurological: Negative.   Psychiatric/Behavioral:  Positive for depression, hallucinations and substance abuse. The patient is nervous/anxious.   Blood pressure 108/66, pulse 88, temperature 97.7 F (36.5 C), temperature source Oral, resp. rate 16, SpO2 100 %. There is no height or weight on file to calculate BMI.  Treatment Plan Summary: Medication management and Plan chart reviewed.  Patient is now calm and not threatening and capable of behaving safely on the ward.  Case reviewed with TTS and inpatient nursing staff.  Plan will be for admission to the inpatient psychiatric ward.  Labs will be reviewed.  Anything missing will be ordered.  We will try to get her back on medicine for her bipolar disorder consistent with what she has been taking in the past.  15-minute checks will be in  place.  Disposition: Recommend psychiatric Inpatient admission when medically cleared.  Alethia Berthold, MD 12/25/2021 4:13 PM

## 2021-12-25 NOTE — ED Notes (Signed)
Breakfast tray given. °

## 2021-12-25 NOTE — ED Notes (Signed)
IVC/Consult completed/ Recommend Inpt Admit 

## 2021-12-26 ENCOUNTER — Other Ambulatory Visit: Payer: Self-pay

## 2021-12-26 ENCOUNTER — Encounter: Payer: Self-pay | Admitting: Psychiatry

## 2021-12-26 DIAGNOSIS — F312 Bipolar disorder, current episode manic severe with psychotic features: Secondary | ICD-10-CM | POA: Diagnosis not present

## 2021-12-26 DIAGNOSIS — F19959 Other psychoactive substance use, unspecified with psychoactive substance-induced psychotic disorder, unspecified: Secondary | ICD-10-CM | POA: Diagnosis not present

## 2021-12-26 DIAGNOSIS — F1520 Other stimulant dependence, uncomplicated: Secondary | ICD-10-CM | POA: Diagnosis not present

## 2021-12-26 LAB — COMPREHENSIVE METABOLIC PANEL
ALT: 15 U/L (ref 0–44)
AST: 11 U/L — ABNORMAL LOW (ref 15–41)
Albumin: 3.8 g/dL (ref 3.5–5.0)
Alkaline Phosphatase: 55 U/L (ref 38–126)
Anion gap: 8 (ref 5–15)
BUN: 14 mg/dL (ref 6–20)
CO2: 28 mmol/L (ref 22–32)
Calcium: 9.4 mg/dL (ref 8.9–10.3)
Chloride: 104 mmol/L (ref 98–111)
Creatinine, Ser: 0.72 mg/dL (ref 0.44–1.00)
GFR, Estimated: >60 mL/min (ref >60)
Glucose, Bld: 99 mg/dL (ref 70–99)
Potassium: 4.3 mmol/L (ref 3.5–5.1)
Sodium: 140 mmol/L (ref 135–145)
Total Bilirubin: 0.6 mg/dL (ref 0.3–1.2)
Total Protein: 6.5 g/dL (ref 6.5–8.1)

## 2021-12-26 LAB — HEMOGLOBIN A1C
Hgb A1c MFr Bld: 4.9 % (ref 4.8–5.6)
Mean Plasma Glucose: 93.93 mg/dL

## 2021-12-26 LAB — TSH: TSH: 0.569 u[IU]/mL (ref 0.350–4.500)

## 2021-12-26 MED ORDER — TRAZODONE HCL 100 MG PO TABS
100.0000 mg | ORAL_TABLET | Freq: Every evening | ORAL | Status: DC | PRN
  Administered 2021-12-26 – 2021-12-31 (×6): 100 mg via ORAL
  Filled 2021-12-26 (×6): qty 1
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
## 2021-12-26 NOTE — Progress Notes (Addendum)
Admission Note:  46 yr female who presents IVC in no acute distress for the treatment of Substance abuse and Bipolar disorder. Patient appears flat, anxious and sad, upon arrival she was noted  restless but was later calm and cooperative with admission process. Patient denies SI/HI/AVH but was noted responding to internal stimuli. Patient endorsed she was doing cocaine for a while and UDS was positive for cocaine. Patient  contracts for safety upon admission. Patient expressed to writer that she is not complaint with her medication since her discharge from hospital.   Pt has Past medical Hx of Bipolar disorder, and Substance Abuse. Patient's skin was assessed and found dry and intact. Patient was also searched and no contraband found, plan of care, unit policies explained and understanding verbalized. Consents was also obtained. 15 minutes safety checks maintained will continue to monitor

## 2021-12-26 NOTE — H&P (Signed)
Psychiatric Admission Assessment Adult  Patient Identification: Autumn [name] MRN:  [id number] Date of Evaluation:  12/26/2021 Chief Complaint:  Bipolar affective disorder, current episode manic with psychotic symptoms ([place]) [F31.2] Principal Diagnosis: Bipolar affective disorder, current episode manic with psychotic symptoms ([place]) Diagnosis:  Principal Problem:   Bipolar affective disorder, current episode manic with psychotic symptoms ([place])  History of Present Illness: 46yo WF with hx of bipolar and stimulant (cocaine and Meth) use disorder, admitted fro severe agitation and bizarre behaviors, suspect substance-induced psychosis/mania.  UDS is + for cocaine on arrival.   Pt was seen in the day room. She is awake, alert, but not coherent.  She asked the writer in a dramatic fashion "could I please kill myself when I get home? Please!"  She admitted that she has not been taking her medicines and was doing cocaine, but could not tell the writer how much she did.    She tolerates the current regimen, which was started in ED, including ability, lithium and depakote. However, she continues to be somewhat disorganized.   She denied Si.   Per ED note, she was disorganized and disoriented yesterday, thought she was in a boarding house.    Associated Signs/Symptoms: Depression Symptoms:  depressed mood, psychomotor agitation, Duration of Depression Symptoms: Greater than two weeks  (Hypo) Manic Symptoms:  Delusions, Anxiety Symptoms:  Excessive Worry, Psychotic Symptoms:  Delusions, PTSD Symptoms: Negative Total Time spent with patient: 45 minutes  Past Psychiatric History: Past history of bipolar disorder with multiple presentations often with mania in the context of substance abuse.  Patient gets psychotic when she is in the grip of her bipolar disorder.  Unfortunately also has a history of noncompliance with treatment in the past.  Has had suicidal behavior in the past.    Is the  patient at risk to self? Yes.    Has the patient been a risk to self in the past 6 months? Yes.    Has the patient been a risk to self within the distant past? Yes.    Is the patient a risk to others? No.  Has the patient been a risk to others in the past 6 months? No.  Has the patient been a risk to others within the distant past? No.   Prior Inpatient Therapy:   Prior Outpatient Therapy:    Alcohol Screening: 1. How often do you have a drink containing alcohol?: Never 2. How many drinks containing alcohol do you have on a typical day when you are drinking?: 1 or 2 3. How often do you have six or more drinks on one occasion?: Never AUDIT-C Score: 0 4. How often during the last year have you found that you were not able to stop drinking once you had started?: Never 5. How often during the last year have you failed to do what was normally expected from you because of drinking?: Never 6. How often during the last year have you needed a first drink in the morning to get yourself going after a heavy drinking session?: Never 7. How often during the last year have you had a feeling of guilt of remorse after drinking?: Never 8. How often during the last year have you been unable to remember what happened the night before because you had been drinking?: Never 9. Have you or someone else been injured as a result of your drinking?: No 10. Has a relative or friend or a doctor or another health worker been concerned about your drinking or  suggested you cut down?: No Alcohol Use Disorder Identification Test Final Score (AUDIT): 0 Substance Abuse History in the last 12 months:  Yes.   Consequences of Substance Abuse: Medical Consequences:  psychosis Previous Psychotropic Medications: Yes  Psychological Evaluations: Yes  Past Medical History:  Past Medical History:  Diagnosis Date   Allergy    Seasonal, Ultram (seizures)   Anemia 2005   Bipolar 1 disorder ([place])    Glaucoma    Seizures ([place]) 2008     Past Surgical History:  Procedure Laterality Date   BREAST EXCISIONAL BIOPSY Right    x 2   CESAREAN SECTION     x 2   LEEP  2000   Family History:  Family History  Problem Relation Age of Onset   Hypertension Mother    Hyperlipidemia Mother    Asthma Mother    Parkinson's disease Father    Lung cancer Maternal Grandmother    Other Paternal Grandfather 29       Cardiac arrest   Heart disease Paternal Grandfather    Tourette syndrome Daughter    Family Psychiatric  History:  + for mood disorder Tobacco Screening:   Social History:  Social History   Substance and Sexual Activity  Alcohol Use Yes   Alcohol/week: 4.0 standard drinks   Types: 4 Cans of beer per week   Comment: Ocassional     Social History   Substance and Sexual Activity  Drug Use Yes   Types: Marijuana   Comment: percocet -last use over 1 week ago. Heroin-last used 2/11    Additional Social History: Marital status: Separated Separated, when?: 2008 What types of issues is patient dealing with in the relationship?: Patient declined to answer Are you sexually active?: Yes What is your sexual orientation?: Bisexual Has your sexual activity been affected by drugs, alcohol, medication, or emotional stress?: Patient states yes but was unable to identify the specific cause. Does patient have children?: Yes How many children?: 2 (Patient states she gave one of her daughters up for adoption and lost custody of her 2nd daughter.) How is patient's relationship with their children?: Patient states she does not have a relationship with her children.    Allergies:   Allergies  Allergen Reactions   Haldol [Haloperidol Lactate]     Patient states that she gets stiff muscles when taking Haldol   Ultram [Tramadol] Other (See Comments)    Seizures   Ziprasidone Hcl     Other reaction(s): Other (See Comments) PER PT WHEN SHE TAKES THIS SHE CANT MOVE HER NECK   Lab Results:  Results for orders placed or  performed during the hospital encounter of 12/25/21 (from the past 48 hour(s))  Lipid panel     Status: None   Collection Time: 12/25/21  8:32 PM  Result Value Ref Range   Cholesterol 160 0 - 200 mg/dL   Triglycerides 95 <150 mg/dL   HDL 58 >40 mg/dL   Total CHOL/HDL Ratio 2.8 RATIO   VLDL 19 0 - 40 mg/dL   LDL Cholesterol 83 0 - 99 mg/dL    Comment:        Total Cholesterol/HDL:CHD Risk Coronary Heart Disease Risk Table                     Men   Women  1/2 Average Risk   3.4   3.3  Average Risk       5.0   4.4  2 X Average Risk  9.6   7.1  3 X Average Risk  23.4   11.0        Use the calculated Patient Ratio above and the CHD Risk Table to determine the patient's CHD Risk.        ATP III CLASSIFICATION (LDL):  <100     mg/dL   Optimal  100-129  mg/dL   Near or Above                    Optimal  130-159  mg/dL   Borderline  160-189  mg/dL   High  >190     mg/dL   Very High Performed at [facility], [address] [place], [place] [address]   Hemoglobin A1c     Status: None   Collection Time: 12/25/21  8:32 PM  Result Value Ref Range   Hgb A1c MFr Bld 4.9 4.8 - 5.6 %    Comment: (NOTE) Pre diabetes:          5.7%-6.4%  Diabetes:              >6.4%  Glycemic control for   <7.0% adults with diabetes    Mean Plasma Glucose 93.93 mg/dL    Comment: Performed at [facility] [address], [place], [place] [address]  TSH     Status: None   Collection Time: 12/26/21  6:55 AM  Result Value Ref Range   TSH 0.569 0.350 - 4.500 uIU/mL    Comment: Performed by a 3rd Generation assay with a functional sensitivity of <=0.01 uIU/mL. Performed at [facility], [address] [place], [place] [address]   Comprehensive metabolic panel     Status: Abnormal   Collection Time: 12/26/21  6:55 AM  Result Value Ref Range   Sodium 140 135 - 145 mmol/L   Potassium 4.3 3.5 - 5.1 mmol/L   Chloride 104 98 - 111 mmol/L   CO2 28 22 - 32 mmol/L   Glucose, Bld  99 70 - 99 mg/dL    Comment: Glucose reference range applies only to samples taken after fasting for at least 8 hours.   BUN 14 6 - 20 mg/dL   Creatinine, Ser 0.72 0.44 - 1.00 mg/dL   Calcium 9.4 8.9 - 10.3 mg/dL   Total Protein 6.5 6.5 - 8.1 g/dL   Albumin 3.8 3.5 - 5.0 g/dL   AST 11 (L) 15 - 41 U/L   ALT 15 0 - 44 U/L   Alkaline Phosphatase 55 38 - 126 U/L   Total Bilirubin 0.6 0.3 - 1.2 mg/dL   GFR, Estimated >60 >60 mL/min    Comment: (NOTE) Calculated using the CKD-EPI Creatinine Equation (2021)    Anion gap 8 5 - 15    Comment: Performed at [facility], [address] [place], [place] [address]    Blood Alcohol level:  Lab Results  Component Value Date   [facility] <10 12/24/2021   ETH <10 83/66/2947    Metabolic Disorder Labs:  Lab Results  Component Value Date   HGBA1C 4.9 12/25/2021   MPG 93.93 12/25/2021   MPG 114 06/02/2021   No results found for: PROLACTIN Lab Results  Component Value Date   CHOL 160 12/25/2021   TRIG 95 12/25/2021   HDL 58 12/25/2021   CHOLHDL 2.8 12/25/2021   VLDL 19 12/25/2021   LDLCALC 83 12/25/2021   LDLCALC 63 06/01/2021    Current Medications: Current Facility-Administered Medications  Medication  Dose Route Frequency Provider Last Rate Last Admin   acetaminophen (TYLENOL) tablet 650 mg  650 mg Oral Q6H PRN Clapacs, [name], MD   650 mg at 12/26/21 1234   alum & mag hydroxide-simeth (MAALOX/MYLANTA) 200-200-20 MG/5ML suspension 30 mL  30 mL Oral Q4H PRN Clapacs, [name], MD       ARIPiprazole (ABILIFY) tablet 20 mg  20 mg Oral Daily Clapacs, [name], MD   20 mg at 12/26/21 0815   benztropine (COGENTIN) tablet 1 mg  1 mg Oral Daily Clapacs, [name], MD   1 mg at 12/26/21 0816   divalproex (DEPAKOTE) DR tablet 500 mg  500 mg Oral Q12H Clapacs, [name], MD   500 mg at 12/26/21 0816   lithium carbonate (ESKALITH) CR tablet 450 mg  450 mg Oral Q12H Clapacs, [name], MD   450 mg at 12/26/21 0816   magnesium hydroxide (MILK OF MAGNESIA)  suspension 30 mL  30 mL Oral Daily PRN Clapacs, [name], MD       nicotine (NICODERM CQ - dosed in mg/24 hours) patch 21 mg  21 mg Transdermal Q0600 Clapacs, [name], MD   21 mg at 12/26/21 0659   traZODone (DESYREL) tablet 100 mg  100 mg Oral QHS PRN Deloria Lair, NP   100 mg at 12/26/21 0024   PTA Medications: Medications Prior to Admission  Medication Sig Dispense Refill Last Dose   ARIPiprazole (ABILIFY) 10 MG tablet Take 1 tablet (10 mg total) by mouth daily. (Patient not taking: Reported on 12/24/2021) 30 tablet 1    divalproex (DEPAKOTE) 250 MG DR tablet Take 1 tablet (250 mg total) by mouth in the morning. (Patient not taking: Reported on 12/24/2021) 30 tablet 1    divalproex (DEPAKOTE) 500 MG DR tablet Take 1 tablet (500 mg total) by mouth at bedtime. (Patient not taking: Reported on 12/24/2021) 30 tablet 1    hydrOXYzine (ATARAX/VISTARIL) 25 MG tablet Take 1 tablet (25 mg total) by mouth 3 (three) times daily as needed for itching (itching, rash). (Patient not taking: Reported on 12/24/2021) 60 tablet 1    traZODone (DESYREL) 50 MG tablet Take 1 tablet (50 mg total) by mouth at bedtime as needed for sleep. (Patient not taking: Reported on 12/24/2021) 30 tablet 1     Musculoskeletal: Strength & Muscle Tone: within normal limits Gait & Station: normal Patient leans: N/A            Psychiatric Specialty Exam:  Presentation  General Appearance: Bizarre; Disheveled  Eye Contact:Good  Speech:Pressured; Slurred  Speech Volume:Decreased  Handedness:Right   Mood and Affect  Mood:Euphoric  Affect:Inappropriate; Full Range   Thought Process  Thought Processes:Disorganized  Duration of Psychotic Symptoms: Greater than six months  Past Diagnosis of Schizophrenia or Psychoactive disorder: Yes  Descriptions of Associations:Tangential  Orientation:Partial  Thought Content:Obsessions; Paranoid Ideation; Illogical  Hallucinations:No data recorded Ideas of  Reference:Delusions  Suicidal Thoughts:No data recorded Homicidal Thoughts:No data recorded  Sensorium  Memory:Immediate Poor; Recent Poor; Remote Poor  Judgment:Poor  Insight:Poor   Executive Functions  Concentration:Poor  Attention Span:Poor  Recall:Poor  Fund of Knowledge:Poor  Language:Poor   Psychomotor Activity  Psychomotor Activity:No data recorded  Assets  Assets:Communication Skills; Desire for Improvement; Resilience; Social Support   Sleep  Sleep:No data recorded   Physical Exam: Physical Exam ROS Blood pressure 104/71, pulse 97, temperature 97.7 F (36.5 C), temperature source Oral, resp. rate 17, SpO2 100 %. There is no height or weight on  file to calculate BMI.  Treatment Plan Summary: Daily contact with patient to assess and evaluate symptoms and progress in treatment and Medication management   Psychosis, r/o Stimulant induced psychosis, r/o Bipolar mania, r/o substance induced mania.  -- continue Abilify 20mg  daily.  -- continue Lithium 450mg  PO BID -- continue Depakote 500mg  po BID>   Stimulant use disorder -- recommend sbustance use treatment.    Observation Level/Precautions:  15 minute checks  Laboratory:  CBC Chemistry Profile UDS  Psychotherapy:    Medications:    Consultations:    Discharge Concerns:    Estimated LOS:  Other:     Physician Treatment Plan for Primary Diagnosis: Bipolar affective disorder, current episode manic with psychotic symptoms ([place]) Long Term Goal(s): Improvement in symptoms so as ready for discharge  Short Term Goals: Ability to identify changes in lifestyle to reduce recurrence of condition will improve, Ability to verbalize feelings will improve, Ability to disclose and discuss suicidal ideas, Ability to demonstrate self-control will improve, Ability to identify and develop effective coping behaviors will improve, Ability to maintain clinical measurements within normal limits will improve,  Compliance with prescribed medications will improve, and Ability to identify triggers associated with substance abuse/mental health issues will improve  Physician Treatment Plan for Secondary Diagnosis: Principal Problem:   Bipolar affective disorder, current episode manic with psychotic symptoms ([place])  Long Term Goal(s): Improvement in symptoms so as ready for discharge  Short Term Goals: Ability to identify changes in lifestyle to reduce recurrence of condition will improve, Ability to verbalize feelings will improve, Ability to disclose and discuss suicidal ideas, Ability to demonstrate self-control will improve, Ability to identify and develop effective coping behaviors will improve, Ability to maintain clinical measurements within normal limits will improve, Compliance with prescribed medications will improve, and Ability to identify triggers associated with substance abuse/mental health issues will improve  I certify that inpatient services furnished can reasonably be expected to improve the patient's condition.    Kellyjo Edgren, MD 2/18/20234:56 PM
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
## 2021-12-26 NOTE — Tx Team (Signed)
Initial Treatment Plan 12/26/2021 5:45 AM [name] WER:[id number]    PATIENT STRESSORS: Health problems   Medication change or noncompliance   Substance abuse   Traumatic event     PATIENT STRENGTHS: Ability for insight  Supportive family/friends    PATIENT IDENTIFIED PROBLEMS: Substance Abuse    Bipolar Disorder                    DISCHARGE CRITERIA:  Improved stabilization in mood, thinking, and/or behavior Motivation to continue treatment in a less acute level of care  PRELIMINARY DISCHARGE PLAN: Outpatient therapy Referrals indicated:  Rehab facility   PATIENT/FAMILY INVOLVEMENT: This treatment plan has been presented to and reviewed with the patient, Autumn [name], The patient and family have been given the opportunity to ask questions and make suggestions.  Harl Bowie, RN 12/26/2021, 5:45 AM

## 2021-12-26 NOTE — Group Note (Signed)
LCSW Group Therapy Note  Group Date: 12/26/2021 Start Time: 1254 End Time: 1415   Type of Therapy and Topic:  Group Therapy - Healthy vs Unhealthy Coping Skills  Participation Level:  Active   Description of Group The focus of this group was to determine what unhealthy coping techniques typically are used by group members and what healthy coping techniques would be helpful in coping with various problems. Patients were guided in becoming aware of the differences between healthy and unhealthy coping techniques. Patients were asked to identify 2-3 healthy coping skills they would like to learn to use more effectively.  Therapeutic Goals Patients learned that coping is what human beings do all day long to deal with various situations in their lives Patients defined and discussed healthy vs unhealthy coping techniques Patients identified their preferred coping techniques and identified whether these were healthy or unhealthy Patients determined 2-3 healthy coping skills they would like to become more familiar with and use more often. Patients provided support and ideas to each other   Summary of Patient Progress:  Patient was the only patient on the unit who presented on time for group. CSW and patient met 1:1. Patient presented with a depressed affect. Patient shared openly about her recent substance use. Patient states she is not interested in substance use treatment at this time but is agreeable to psychiatric medication management. Patient appears to be in the pre-contemplation stage of change.    Therapeutic Modalities Cognitive Behavioral Therapy Motivational Interviewing  Sherilyn Dacosta 12/26/2021  4:49 PM

## 2021-12-27 DIAGNOSIS — F1520 Other stimulant dependence, uncomplicated: Secondary | ICD-10-CM | POA: Diagnosis not present

## 2021-12-27 DIAGNOSIS — F312 Bipolar disorder, current episode manic severe with psychotic features: Secondary | ICD-10-CM | POA: Diagnosis not present

## 2021-12-27 DIAGNOSIS — F19959 Other psychoactive substance use, unspecified with psychoactive substance-induced psychotic disorder, unspecified: Secondary | ICD-10-CM | POA: Diagnosis not present

## 2021-12-27 NOTE — Plan of Care (Signed)
D- Patient alert and oriented. Patient presented in a sad but pleasant mood on assessment stating that she slept "off and on" last night and had no complaints to voice to this Probation officer. When this writer asked patient if the pain intervention that she received on the previous shift helped, she stated that it didn't, rating her pain a "5/10". Patient endorsed both depression and anxiety stating "you remember my boyfriend that used to come see me, well he died about six months ago, and I've been going through hell ever since". Patient did not request any additional medication other than her scheduled meds. Patient denied SI, HI, AVH at this time. Patient had no stated goals for today.  A- Scheduled medications administered to patient, per MD orders. Support and encouragement provided.  Routine safety checks conducted every 15 minutes.  Patient informed to notify staff with problems or concerns.  R- No adverse drug reactions noted. Patient contracts for safety at this time. Patient compliant with medications and treatment plan. Patient receptive, calm, and cooperative. Patient interacts well with others on the unit.  Patient remains safe at this time.  Problem: Education: Goal: Knowledge of [name] Education information/materials will improve Outcome: Not Progressing Goal: Emotional status will improve Outcome: Not Progressing Goal: Mental status will improve Outcome: Not Progressing Goal: Verbalization of understanding the information provided will improve Outcome: Not Progressing   Problem: Activity: Goal: Interest or engagement in activities will improve Outcome: Not Progressing Goal: Sleeping patterns will improve Outcome: Not Progressing   Problem: Coping: Goal: Ability to verbalize frustrations and anger appropriately will improve Outcome: Not Progressing Goal: Ability to demonstrate self-control will improve Outcome: Not Progressing   Problem: Health Behavior/Discharge  Planning: Goal: Identification of resources available to assist in meeting health care needs will improve Outcome: Not Progressing Goal: Compliance with treatment plan for underlying cause of condition will improve Outcome: Not Progressing   Problem: Physical Regulation: Goal: Ability to maintain clinical measurements within normal limits will improve Outcome: Not Progressing   Problem: Safety: Goal: Periods of time without injury will increase Outcome: Not Progressing   Problem: Education: Goal: Knowledge of disease or condition will improve Outcome: Not Progressing Goal: Understanding of discharge needs will improve Outcome: Not Progressing   Problem: Health Behavior/Discharge Planning: Goal: Ability to identify changes in lifestyle to reduce recurrence of condition will improve Outcome: Not Progressing Goal: Identification of resources available to assist in meeting health care needs will improve Outcome: Not Progressing   Problem: Physical Regulation: Goal: Complications related to the disease process, condition or treatment will be avoided or minimized Outcome: Not Progressing   Problem: Safety: Goal: Ability to remain free from injury will improve Outcome: Not Progressing

## 2021-12-27 NOTE — Progress Notes (Signed)
[facility] MD Progress Note  12/27/2021 12:11 PM Autumn [name]  MRN:  [id number] Subjective:  Pt seen in the day room.  Comparatively calmer than yesterday, still quite agitated.  She begged for "an antidepressant" and when attempted to explain that she has already been on 2 mood stabilizer and 1 antipsychotic, which can partially function as mood enhancer, she doesn't seem to be able to comprehend and got very upset, cursed at the Probation officer.   Denied SI, denied racing thoughts.   Principal Problem: Bipolar affective disorder, current episode manic with psychotic symptoms ([place]) Diagnosis: Principal Problem:   Bipolar affective disorder, current episode manic with psychotic symptoms ([place])  Total Time spent with patient: 30 minutes  Past Psychiatric History: Past history of bipolar disorder with multiple presentations often with mania in the context of substance abuse.  Patient gets psychotic when she is in the grip of her bipolar disorder.  Unfortunately also has a history of noncompliance with treatment in the past.  Has had suicidal behavior in the past.  Past Medical History:  Past Medical History:  Diagnosis Date   Allergy    Seasonal, Ultram (seizures)   Anemia 2005   Bipolar 1 disorder ([place])    Glaucoma    Seizures ([place]) 2008    Past Surgical History:  Procedure Laterality Date   BREAST EXCISIONAL BIOPSY Right    x 2   CESAREAN SECTION     x 2   LEEP  2000   Family History:  Family History  Problem Relation Age of Onset   Hypertension Mother    Hyperlipidemia Mother    Asthma Mother    Parkinson's disease Father    Lung cancer Maternal Grandmother    Other Paternal Grandfather 74       Cardiac arrest   Heart disease Paternal Grandfather    Tourette syndrome Daughter    Family Psychiatric  History: unknown Social History:  Social History   Substance and Sexual Activity  Alcohol Use Yes   Alcohol/week: 4.0 standard drinks   Types: 4 Cans of beer per week   Comment:  Ocassional     Social History   Substance and Sexual Activity  Drug Use Yes   Types: Marijuana   Comment: percocet -last use over 1 week ago. Heroin-last used 2/11    Social History   Socioeconomic History   Marital status: Legally Separated    Spouse name: Not on file   Number of children: Not on file   Years of education: Not on file   Highest education level: Not on file  Occupational History   Not on file  Tobacco Use   Smoking status: Every Day    Packs/day: 1.00    Years: 9.00    Pack years: 9.00    Types: E-cigarettes, Cigarettes   Smokeless tobacco: Never  Vaping Use   Vaping Use: Never used  Substance and Sexual Activity   Alcohol use: Yes    Alcohol/week: 4.0 standard drinks    Types: 4 Cans of beer per week    Comment: Ocassional   Drug use: Yes    Types: Marijuana    Comment: percocet -last use over 1 week ago. Heroin-last used 2/11   Sexual activity: Yes    Birth control/protection: None  Other Topics Concern   Not on file  Social History Narrative   Not on file   Social Determinants of Health   Financial Resource Strain: Not on file  Food Insecurity: Not on file  Transportation Needs: Not on file  Physical Activity: Not on file  Stress: Not on file  Social Connections: Not on file   Additional Social History:   Sleep: Fair  Appetite:  Fair  Current Medications: Current Facility-Administered Medications  Medication Dose Route Frequency Provider Last Rate Last Admin   acetaminophen (TYLENOL) tablet 650 mg  650 mg Oral Q6H PRN Clapacs, [name], MD   650 mg at 12/27/21 0647   alum & mag hydroxide-simeth (MAALOX/MYLANTA) 200-200-20 MG/5ML suspension 30 mL  30 mL Oral Q4H PRN Clapacs, [name], MD       ARIPiprazole (ABILIFY) tablet 20 mg  20 mg Oral Daily Clapacs, [name], MD   20 mg at 12/27/21 0856   benztropine (COGENTIN) tablet 1 mg  1 mg Oral Daily Clapacs, [name], MD   1 mg at 12/27/21 0856   divalproex (DEPAKOTE) DR tablet 500 mg  500 mg  Oral Q12H Clapacs, [name], MD   500 mg at 12/27/21 0856   lithium carbonate (ESKALITH) CR tablet 450 mg  450 mg Oral Q12H Clapacs, [name], MD   450 mg at 12/27/21 0856   magnesium hydroxide (MILK OF MAGNESIA) suspension 30 mL  30 mL Oral Daily PRN Clapacs, [name], MD       nicotine (NICODERM CQ - dosed in mg/24 hours) patch 21 mg  21 mg Transdermal Q0600 Clapacs, [name], MD   21 mg at 12/27/21 7494   traZODone (DESYREL) tablet 100 mg  100 mg Oral QHS PRN Deloria Lair, NP   100 mg at 12/26/21 2147    Lab Results:  Results for orders placed or performed during the hospital encounter of 12/25/21 (from the past 48 hour(s))  Lipid panel     Status: None   Collection Time: 12/25/21  8:32 PM  Result Value Ref Range   Cholesterol 160 0 - 200 mg/dL   Triglycerides 95 <150 mg/dL   HDL 58 >40 mg/dL   Total CHOL/HDL Ratio 2.8 RATIO   VLDL 19 0 - 40 mg/dL   LDL Cholesterol 83 0 - 99 mg/dL    Comment:        Total Cholesterol/HDL:CHD Risk Coronary Heart Disease Risk Table                     Men   Women  1/2 Average Risk   3.4   3.3  Average Risk       5.0   4.4  2 X Average Risk   9.6   7.1  3 X Average Risk  23.4   11.0        Use the calculated Patient Ratio above and the CHD Risk Table to determine the patient's CHD Risk.        ATP III CLASSIFICATION (LDL):  <100     mg/dL   Optimal  100-129  mg/dL   Near or Above                    Optimal  130-159  mg/dL   Borderline  160-189  mg/dL   High  >190     mg/dL   Very High Performed at [facility], [address] [place], [place] [address]   Hemoglobin A1c     Status: None   Collection Time: 12/25/21  8:32 PM  Result Value Ref Range   Hgb A1c MFr Bld 4.9 4.8 - 5.6 %    Comment: (NOTE)  Pre diabetes:          5.7%-6.4%  Diabetes:              >6.4%  Glycemic control for   <7.0% adults with diabetes    Mean Plasma Glucose 93.93 mg/dL    Comment: Performed at [facility] [address] [place], [place]  [address]  TSH     Status: None   Collection Time: 12/26/21  6:55 AM  Result Value Ref Range   TSH 0.569 0.350 - 4.500 uIU/mL    Comment: Performed by a 3rd Generation assay with a functional sensitivity of <=0.01 uIU/mL. Performed at [facility], [address] [place], [place] [address]   Comprehensive metabolic panel     Status: Abnormal   Collection Time: 12/26/21  6:55 AM  Result Value Ref Range   Sodium 140 135 - 145 mmol/L   Potassium 4.3 3.5 - 5.1 mmol/L   Chloride 104 98 - 111 mmol/L   CO2 28 22 - 32 mmol/L   Glucose, Bld 99 70 - 99 mg/dL    Comment: Glucose reference range applies only to samples taken after fasting for at least 8 hours.   BUN 14 6 - 20 mg/dL   Creatinine, Ser 0.72 0.44 - 1.00 mg/dL   Calcium 9.4 8.9 - 10.3 mg/dL   Total Protein 6.5 6.5 - 8.1 g/dL   Albumin 3.8 3.5 - 5.0 g/dL   AST 11 (L) 15 - 41 U/L   ALT 15 0 - 44 U/L   Alkaline Phosphatase 55 38 - 126 U/L   Total Bilirubin 0.6 0.3 - 1.2 mg/dL   GFR, Estimated >60 >60 mL/min    Comment: (NOTE) Calculated using the CKD-EPI Creatinine Equation (2021)    Anion gap 8 5 - 15    Comment: Performed at [facility], [address] [place], [place] [address]    Blood Alcohol level:  Lab Results  Component Value Date   [facility] <10 12/24/2021   ETH <10 19/37/9024    Metabolic Disorder Labs: Lab Results  Component Value Date   HGBA1C 4.9 12/25/2021   MPG 93.93 12/25/2021   MPG 114 06/02/2021   No results found for: PROLACTIN Lab Results  Component Value Date   CHOL 160 12/25/2021   TRIG 95 12/25/2021   HDL 58 12/25/2021   CHOLHDL 2.8 12/25/2021   VLDL 19 12/25/2021   LDLCALC 83 12/25/2021   LDLCALC 63 06/01/2021    Physical Findings: AIMS:  , ,  ,  ,    CIWA:    COWS:     Musculoskeletal: Strength & Muscle Tone: within normal limits Gait & Station: normal Patient leans: N/A  Psychiatric Specialty Exam:  Presentation  General Appearance: Bizarre; Disheveled  Eye  Contact:Good  Speech:Pressured; Slurred  Speech Volume:Decreased  Handedness:Right   Mood and Affect  Mood:Euphoric  Affect:Inappropriate; Full Range   Thought Process  Thought Processes:Disorganized  Descriptions of Associations:Tangential  Orientation:Partial  Thought Content:Obsessions; Paranoid Ideation; Illogical  History of Schizophrenia/Schizoaffective disorder:Yes  Duration of Psychotic Symptoms:Greater than six months  Hallucinations:No data recorded Ideas of Reference:Delusions  Suicidal Thoughts:No data recorded Homicidal Thoughts:No data recorded  Sensorium  Memory:Immediate Poor; Recent Poor; Remote Poor  Judgment:Poor  Insight:Poor   Executive Functions  Concentration:Poor  Attention Span:Poor  Recall:Poor  Fund of Knowledge:Poor  Language:Poor   Psychomotor Activity  Psychomotor Activity:No data recorded  Assets  Assets:Communication Skills; Desire for Improvement; Resilience; Social Support   Sleep  Sleep:No data recorded   Physical Exam: Physical Exam ROS Blood pressure 111/79, pulse (!) 106, temperature 97.9 F (36.6 C), temperature source Oral, resp. rate 18, SpO2 100 %. There is no height or weight on file to calculate BMI.   Treatment Plan Summary: Daily contact with patient to assess and evaluate symptoms and progress in treatment and Medication management  Psychosis, r/o Stimulant induced psychosis, r/o Bipolar mania, r/o substance induced mania.  -- continue Abilify 20mg  daily.  -- continue Lithium 450mg  PO BID -- continue Depakote 500mg  po BID.  -- Qtc 434   Stimulant use disorder -- recommend sbustance use treatment.   Latonyia Lopata, MD 12/27/2021, 12:11 PM

## 2021-12-27 NOTE — Progress Notes (Signed)
Patient came to nurses station asking for a nail clipper, and this writer explained to patient that we don't keep nail clippers on the unit. Patient was offered an Estate manager/land agent and she stated "oh God no". Patient proceeds to ask if this writer could cut her bangs because her mother is coming to visit her and she used to keep her hair in bangs. This Probation officer stated that staff could not cut her hair and that she could not use any scissors to cut it. Patient then states "I'll just bite it off". This Probation officer advised patient against doing that, so she then states "I'll figure it out", and walks off towards her room.

## 2021-12-28 DIAGNOSIS — F312 Bipolar disorder, current episode manic severe with psychotic features: Principal | ICD-10-CM

## 2021-12-28 MED ORDER — ARIPIPRAZOLE 10 MG PO TABS
30.0000 mg | ORAL_TABLET | Freq: Every day | ORAL | Status: DC
Start: 2021-12-29 — End: 2022-01-08
  Administered 2021-12-29 – 2022-01-08 (×11): 30 mg via ORAL
  Filled 2021-12-28 (×11): qty 3

## 2021-12-28 NOTE — Progress Notes (Signed)
D: Pt alert and oriented. Pt rates depression 5/10, hopelessness 5/10, and anxiety 6/10. Pt goal: "Go to groups." Pt reports energy level as normal and concentration as being good. Pt reports sleep last night as being fair. Pt did receive medications for sleep and did not find them helpful. Pt reports experiencing 6/10 left hand/ankle pain at this time, pt refuses prn pain management medications. Pt denies experiencing any SI/HI, or AVH at this time.   During treatment team the pt stated her goals were to "Go to group, get more education, say goodbye to friends and family when I get out of here.   This evening this pt reported experiencing lower left tooth pain and requested tylenol. This observed this writer pull the tylenol from the pyxis, scan the tylenol, and open it in front of the pt, to which she stated what is this Wellbutrin. This stated no it's not, you watched me pull the medication, scan it, and open it in front of you, it's Tylenol. The pt stated no I didn't and no it's not. The pt then walked away and the medication had to be wasted.   Later the pt approached this writer in the community room stating from now on I'm refusing all pain medications. This Probation officer stated that's okay, you have that right however you approached me and you asked me for the tylenol, watched me pull it, scan it, and open it in front of you. To this the pt stated whatever, I'm not going to argue about it and walked out of the community room shaking her head all around making an odd noise.   A: Scheduled medications administered to pt, per MD orders. Support and encouragement provided. Frequent verbal contact made. Routine safety checks conducted q15 minutes.   R: No adverse drug reactions noted. Pt verbally contracts for safety at this time. Pt complaint with medications and treatment plan. Pt interacts poorly/inappropriately with others on the unit. Pt remains safe at this time. Will continue to monitor.
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
## 2021-12-28 NOTE — Progress Notes (Signed)
Recreation Therapy Notes  INPATIENT RECREATION TR PLAN  Patient Details Name: Autumn [name] MRN: [id number] DOB: May 26, 1976 Today's Date: 12/28/2021  Rec Therapy Plan Is patient appropriate for Therapeutic Recreation?: Yes Treatment times per week: at least 3 Estimated Length of Stay: 5-7 days TR Treatment/Interventions: Group participation (Comment)  Discharge Criteria Pt will be discharged from therapy if:: Discharged Treatment plan/goals/alternatives discussed and agreed upon by:: Patient/family  Discharge Summary     Autumn [name] 12/28/2021, 3:49 PM

## 2021-12-28 NOTE — Progress Notes (Signed)
Patient ID: Autumn [name], female   DOB: [birth year], 46 y.o.   MRN: [id number]  Pt presents anxious and somewhat preoccupied. She however denied SI/HI/AVH or self harm thoughts or intent. She was noted to mumble to self under her breath and would not speak out when asked what she was saying. She has poor hygiene and unkempt hair. She was encouraged to tend to her ADL's and she stated that she will. She was med compliant, ate her HS snacks and asked for Tylenol for L ankle pain 5/10 with good effect. Upon reassessment, she stated that "Am not bothered by it that much anymore." She was also administered PRN Trazodone for sleep pending results as she is in the dayroom watching TV. Q15 min observations maintained for safety and support provided as needed.

## 2021-12-28 NOTE — Progress Notes (Signed)
Recreation Therapy Notes  INPATIENT RECREATION THERAPY ASSESSMENT  Patient Details Name: Autumn [name] MRN: [id number] DOB: December 17, 1975 Today's Date: 12/28/2021       Information Obtained From: Patient  Able to Participate in Assessment/Interview: Yes  Patient Presentation: Responsive, [place]  Reason for Admission (Per Patient): Active Symptoms  Patient Stressors:    Coping Skills:   Building control surveyor, Exercise, Talk, Avoidance  Leisure Interests (2+):  Individual - Reading, Art - Coloring, Sports - Swimming, Exercise - Walking, Individual - TV  Frequency of Recreation/Participation: Monthly  Awareness of Community Resources:     [employer] Resources:  [employer], [employer], Art therapist, [employer], [employer], Tax inspector, Patent examiner  Current Use: No  If no, Barriers?: Museum/gallery curator, Transport planner, Hydrographic surveyor  Expressed Interest in [place]: Yes  [place]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

## 2021-12-28 NOTE — Progress Notes (Signed)
Patient is alert and oriented x 3 with periods of confusion to situation, her speech is tangential, thoughts are disorganized and incoherent at times. She is denies SI/HI/AVH but appears responding to internal stimuli. Patient is not interacting appropriately to peers and staff. 15 minutes safety checks maintained will continue to closely monitor.

## 2021-12-28 NOTE — Progress Notes (Signed)
Recreation Therapy Notes  Date: 12/28/2021  Time: 10:30 am    Location: Craft room    Behavioral response: Appropriate  Intervention Topic:  Self-care     Discussion/Intervention:  Group content today was focused on Self-Care. The group defined self-care and some positive ways they care for themselves. Individuals expressed ways and reasons why they neglected any self-care in the past. Patients described ways to improve self-care in the future. The group explained what could happen if they did not do any self-care activities at all. The group participated in the intervention self-care assessment where they had a chance to discover some of their weaknesses and strengths in self- care. Patient came up with a self-care plan to improve themselves in the future.  Clinical Observations/Feedback: Patient came to group and defined self-care as taking care of self and loving yourself. Individual was social with peers and staff while participating in the intervention.   Autumn [name] LRT/CTRS         Autumn [name] 12/28/2021 12:30 PM

## 2021-12-28 NOTE — Progress Notes (Signed)
[facility] MD Progress Note  12/28/2021 4:16 PM Autumn [name]  MRN:  [id number] Subjective: Follow-up for this 46 year old woman with bipolar disorder.  Patient is calm her today.  Came to treatment team.  Did not voice any delusions during interaction.  Still a little disheveled but has not been showing any truly bizarre behavior.  Seems to tolerate medicine okay Principal Problem: Bipolar affective disorder, current episode manic with psychotic symptoms ([place]) Diagnosis: Principal Problem:   Bipolar affective disorder, current episode manic with psychotic symptoms ([place])  Total Time spent with patient: 30 minutes  Past Psychiatric History: Past history of recurrent episodes of bizarre behavior mania and substance abuse  Past Medical History:  Past Medical History:  Diagnosis Date   Allergy    Seasonal, Ultram (seizures)   Anemia 2005   Bipolar 1 disorder ([place])    Glaucoma    Seizures ([place]) 2008    Past Surgical History:  Procedure Laterality Date   BREAST EXCISIONAL BIOPSY Right    x 2   CESAREAN SECTION     x 2   LEEP  2000   Family History:  Family History  Problem Relation Age of Onset   Hypertension Mother    Hyperlipidemia Mother    Asthma Mother    Parkinson's disease Father    Lung cancer Maternal Grandmother    Other Paternal Grandfather 39       Cardiac arrest   Heart disease Paternal Grandfather    Tourette syndrome Daughter    Family Psychiatric  History: See previous Social History:  Social History   Substance and Sexual Activity  Alcohol Use Yes   Alcohol/week: 4.0 standard drinks   Types: 4 Cans of beer per week   Comment: Ocassional     Social History   Substance and Sexual Activity  Drug Use Yes   Types: Marijuana   Comment: percocet -last use over 1 week ago. Heroin-last used 2/11    Social History   Socioeconomic History   Marital status: Legally Separated    Spouse name: Not on file   Number of children: Not on file   Years of education:  Not on file   Highest education level: Not on file  Occupational History   Not on file  Tobacco Use   Smoking status: Every Day    Packs/day: 1.00    Years: 9.00    Pack years: 9.00    Types: E-cigarettes, Cigarettes   Smokeless tobacco: Never  Vaping Use   Vaping Use: Never used  Substance and Sexual Activity   Alcohol use: Yes    Alcohol/week: 4.0 standard drinks    Types: 4 Cans of beer per week    Comment: Ocassional   Drug use: Yes    Types: Marijuana    Comment: percocet -last use over 1 week ago. Heroin-last used 2/11   Sexual activity: Yes    Birth control/protection: None  Other Topics Concern   Not on file  Social History Narrative   Not on file   Social Determinants of Health   Financial Resource Strain: Not on file  Food Insecurity: Not on file  Transportation Needs: Not on file  Physical Activity: Not on file  Stress: Not on file  Social Connections: Not on file   Additional Social History:                         Sleep: Fair  Appetite:  Fair  Current Medications: Current  Facility-Administered Medications  Medication Dose Route Frequency Provider Last Rate Last Admin   acetaminophen (TYLENOL) tablet 650 mg  650 mg Oral Q6H PRN Francisco Eyerly, [name], MD   650 mg at 12/28/21 1047   alum & mag hydroxide-simeth (MAALOX/MYLANTA) 200-200-20 MG/5ML suspension 30 mL  30 mL Oral Q4H PRN Orlandria Kissner, [name], MD       Derrill Memo ON 12/29/2021] ARIPiprazole (ABILIFY) tablet 30 mg  30 mg Oral Daily Tammie Yanda T, MD       benztropine (COGENTIN) tablet 1 mg  1 mg Oral Daily Malasia Torain T, MD   1 mg at 12/28/21 3335   divalproex (DEPAKOTE) DR tablet 500 mg  500 mg Oral Q12H Phillipe Clemon T, MD   500 mg at 12/28/21 4562   lithium carbonate (ESKALITH) CR tablet 450 mg  450 mg Oral Q12H Arshi Duarte T, MD   450 mg at 12/28/21 5638   magnesium hydroxide (MILK OF MAGNESIA) suspension 30 mL  30 mL Oral Daily PRN Cielle Aguila T, MD       nicotine (NICODERM CQ - dosed in  mg/24 hours) patch 21 mg  21 mg Transdermal Q0600 Geovany Trudo T, MD   21 mg at 12/28/21 9373   traZODone (DESYREL) tablet 100 mg  100 mg Oral QHS PRN Deloria Lair, NP   100 mg at 12/27/21 2158    Lab Results: No results found for this or any previous visit (from the past 48 hour(s)).  Blood Alcohol level:  Lab Results  Component Value Date   ETH <10 12/24/2021   ETH <10 42/87/6811    Metabolic Disorder Labs: Lab Results  Component Value Date   HGBA1C 4.9 12/25/2021   MPG 93.93 12/25/2021   MPG 114 06/02/2021   No results found for: PROLACTIN Lab Results  Component Value Date   CHOL 160 12/25/2021   TRIG 95 12/25/2021   HDL 58 12/25/2021   CHOLHDL 2.8 12/25/2021   VLDL 19 12/25/2021   LDLCALC 83 12/25/2021   LDLCALC 63 06/01/2021    Physical Findings: AIMS:  , ,  ,  ,    CIWA:    COWS:     Musculoskeletal: Strength & Muscle Tone: within normal limits Gait & Station: normal Patient leans: N/A  Psychiatric Specialty Exam:  Presentation  General Appearance: Bizarre; Disheveled  Eye Contact:Good  Speech:Pressured; Slurred  Speech Volume:Decreased  Handedness:Right   Mood and Affect  Mood:Euphoric  Affect:Inappropriate; Full Range   Thought Process  Thought Processes:Disorganized  Descriptions of Associations:Tangential  Orientation:Partial  Thought Content:Obsessions; Paranoid Ideation; Illogical  History of Schizophrenia/Schizoaffective disorder:Yes  Duration of Psychotic Symptoms:Greater than six months  Hallucinations:No data recorded Ideas of Reference:Delusions  Suicidal Thoughts:No data recorded Homicidal Thoughts:No data recorded  Sensorium  Memory:Immediate Poor; Recent Poor; Remote Poor  Judgment:Poor  Insight:Poor   Executive Functions  Concentration:Poor  Attention Span:Poor  Recall:Poor  Fund of Knowledge:Poor  Language:Poor   Psychomotor Activity  Psychomotor Activity:No data recorded  Assets   Assets:Communication Skills; Desire for Improvement; Resilience; Social Support   Sleep  Sleep:No data recorded   Physical Exam: Physical Exam Vitals and nursing note reviewed.  Constitutional:      Appearance: Normal appearance.  HENT:     Head: Normocephalic and atraumatic.     Mouth/Throat:     Pharynx: Oropharynx is clear.  Eyes:     Pupils: Pupils are equal, round, and reactive to light.  Cardiovascular:     Rate and Rhythm: Normal rate and  regular rhythm.  Pulmonary:     Effort: Pulmonary effort is normal.     Breath sounds: Normal breath sounds.  Abdominal:     General: Abdomen is flat.     Palpations: Abdomen is soft.  Musculoskeletal:        General: Normal range of motion.  Skin:    General: Skin is warm and dry.  Neurological:     General: No focal deficit present.     Mental Status: She is alert. Mental status is at baseline.  Psychiatric:        Attention and Perception: Attention normal.        Mood and Affect: Mood is anxious.        Speech: Speech is delayed.        Behavior: Behavior is slowed.        Thought Content: Thought content is paranoid.   Review of Systems  Constitutional: Negative.   HENT: Negative.    Eyes: Negative.   Respiratory: Negative.    Cardiovascular: Negative.   Gastrointestinal: Negative.   Musculoskeletal: Negative.   Skin: Negative.   Neurological: Negative.   Psychiatric/Behavioral:  Positive for substance abuse. Negative for hallucinations and suicidal ideas. The patient is nervous/anxious.   Blood pressure 114/79, pulse 98, temperature 97.9 F (36.6 C), temperature source Oral, resp. rate 18, height 5\' 3"  (1.6 m), weight 44 kg, SpO2 99 %. Body mass index is 17.18 kg/m.   Treatment Plan Summary: Medication management and Plan I am going to increase the dose of the Abilify to 30 mg a day.  I am going to check a lithium level tomorrow morning.  Patient is looking much better.  Encourage group attendance.  Possibly  ready for discharge in a couple days  Alethia Berthold, MD 12/28/2021, 4:16 PM

## 2021-12-29 DIAGNOSIS — F312 Bipolar disorder, current episode manic severe with psychotic features: Secondary | ICD-10-CM | POA: Diagnosis not present

## 2021-12-29 MED ORDER — ENSURE ENLIVE PO LIQD
237.0000 mL | Freq: Two times a day (BID) | ORAL | Status: DC
  Administered 2021-12-29 – 2022-01-08 (×20): 237 mL via ORAL

## 2021-12-29 MED ORDER — HYDROXYZINE HCL 50 MG PO TABS
50.0000 mg | ORAL_TABLET | Freq: Four times a day (QID) | ORAL | Status: DC | PRN
  Administered 2021-12-29 – 2022-01-08 (×23): 50 mg via ORAL
  Filled 2021-12-29 (×24): qty 1

## 2021-12-29 NOTE — Progress Notes (Addendum)
Pt refused to have her Lithium level lab draw. She started running around the unit in circles. Was redirected and she ran to the dayroom and sat down.

## 2021-12-29 NOTE — Progress Notes (Signed)
Patient presents with a bizarre affect and is disorganized. Pt stated that she thought she was shot in the head, then started laughing. Pt denies SI/HI/AVH but presents as responding to internal stimuli. Pt compliant with medication administration per MD orders. Pt given education, support, and encouragement to be active in her treatment plan. Pt being monitored Q 15 minutes for safety per unit protocol. Pt remains safe on the unit.

## 2021-12-29 NOTE — Group Note (Signed)
[place] LCSW Group Therapy Note   Group Date: 12/29/2021 Start Time: 1300 End Time: 1400   Type of Therapy/Topic:  Group Therapy:  Emotion Regulation  Participation Level:  Minimal   Mood:  Description of Group:    The purpose of this group is to assist patients in learning to regulate negative emotions and experience positive emotions. Patients will be guided to discuss ways in which they have been vulnerable to their negative emotions. These vulnerabilities will be juxtaposed with experiences of positive emotions or situations, and patients challenged to use positive emotions to combat negative ones. Special emphasis will be placed on coping with negative emotions in conflict situations, and patients will process healthy conflict resolution skills.  Therapeutic Goals: Patient will identify two positive emotions or experiences to reflect on in order to balance out negative emotions:  Patient will label two or more emotions that they find the most difficult to experience:  Patient will be able to demonstrate positive conflict resolution skills through discussion or role plays:   Summary of Patient Progress:  Patient was present for the entire group; participated to the best of their ability. Patient attempted to make suggestive conversation but it was unclear with racing thoughts.    Therapeutic Modalities:   Cognitive Behavioral Therapy Feelings Identification Dialectical Behavioral Therapy   Jarrett Ables, Student-Social Work

## 2021-12-29 NOTE — Progress Notes (Addendum)
Recreation Therapy Notes   Date: 12/29/2021  Time: 11:00 am   Location: Court yard   Behavioral response: Appropriate  Intervention Topic:  Wellness   Discussion/Intervention:  Group content today was focused on Wellness. The group defined wellness and some positive ways they make decisions for themselves. Individuals expressed reasons why they neglected any wellness in the past. Patients described ways to improve wellness skills in the future. The group explained what could happen if they did not do any wellness at all. Participants express how bad choices has affected them and others around them. Individual explained the importance of wellness. The group participated in the intervention Testing my Wellness where they had a chance to identify some of their weaknesses and strengths in wellness.  Clinical Observations/Feedback: Patient came to group and was focused on what peers and staff had to say about wellness. Individual was social with peers and staff while participating in the intervention.   Autumn [name] LRT/CTRS         Autumn [name] 12/29/2021 12:20 PM

## 2021-12-29 NOTE — Progress Notes (Signed)
[facility] MD Progress Note  12/29/2021 4:00 PM Autumn [name]  MRN:  [id number] Subjective: Follow-up with this patient with bipolar disorder.  Last night she was up much of the night.  Patient is still psychotic paranoid and confused during conversation. Principal Problem: Bipolar affective disorder, current episode manic with psychotic symptoms ([place]) Diagnosis: Principal Problem:   Bipolar affective disorder, current episode manic with psychotic symptoms ([place])  Total Time spent with patient: 30 minutes  Past Psychiatric History: Past history of bipolar disorder and substance abuse  Past Medical History:  Past Medical History:  Diagnosis Date   Allergy    Seasonal, Ultram (seizures)   Anemia 2005   Bipolar 1 disorder ([place])    Glaucoma    Seizures ([place]) 2008    Past Surgical History:  Procedure Laterality Date   BREAST EXCISIONAL BIOPSY Right    x 2   CESAREAN SECTION     x 2   LEEP  2000   Family History:  Family History  Problem Relation Age of Onset   Hypertension Mother    Hyperlipidemia Mother    Asthma Mother    Parkinson's disease Father    Lung cancer Maternal Grandmother    Other Paternal Grandfather 37       Cardiac arrest   Heart disease Paternal Grandfather    Tourette syndrome Daughter    Family Psychiatric  History: See previous Social History:  Social History   Substance and Sexual Activity  Alcohol Use Yes   Alcohol/week: 4.0 standard drinks   Types: 4 Cans of beer per week   Comment: Ocassional     Social History   Substance and Sexual Activity  Drug Use Yes   Types: Marijuana   Comment: percocet -last use over 1 week ago. Heroin-last used 2/11    Social History   Socioeconomic History   Marital status: Legally Separated    Spouse name: Not on file   Number of children: Not on file   Years of education: Not on file   Highest education level: Not on file  Occupational History   Not on file  Tobacco Use   Smoking status: Every Day     Packs/day: 1.00    Years: 9.00    Pack years: 9.00    Types: E-cigarettes, Cigarettes   Smokeless tobacco: Never  Vaping Use   Vaping Use: Never used  Substance and Sexual Activity   Alcohol use: Yes    Alcohol/week: 4.0 standard drinks    Types: 4 Cans of beer per week    Comment: Ocassional   Drug use: Yes    Types: Marijuana    Comment: percocet -last use over 1 week ago. Heroin-last used 2/11   Sexual activity: Yes    Birth control/protection: None  Other Topics Concern   Not on file  Social History Narrative   Not on file   Social Determinants of Health   Financial Resource Strain: Not on file  Food Insecurity: Not on file  Transportation Needs: Not on file  Physical Activity: Not on file  Stress: Not on file  Social Connections: Not on file   Additional Social History:                         Sleep: Poor  Appetite:  Poor  Current Medications: Current Facility-Administered Medications  Medication Dose Route Frequency Provider Last Rate Last Admin   acetaminophen (TYLENOL) tablet 650 mg  650 mg Oral Q6H  PRN Arissa Fagin, [name], MD   650 mg at 12/29/21 0752   alum & mag hydroxide-simeth (MAALOX/MYLANTA) 200-200-20 MG/5ML suspension 30 mL  30 mL Oral Q4H PRN Jlynn Ly T, MD       ARIPiprazole (ABILIFY) tablet 30 mg  30 mg Oral Daily Erika Slaby T, MD   30 mg at 12/29/21 7989   benztropine (COGENTIN) tablet 1 mg  1 mg Oral Daily Ave Scharnhorst T, MD   1 mg at 12/29/21 0752   divalproex (DEPAKOTE) DR tablet 500 mg  500 mg Oral Q12H Anastazja Isaac T, MD   500 mg at 12/29/21 2119   feeding supplement (ENSURE ENLIVE / ENSURE PLUS) liquid 237 mL  237 mL Oral BID BM Dilana Mcphie T, MD   237 mL at 12/29/21 1402   lithium carbonate (ESKALITH) CR tablet 450 mg  450 mg Oral Q12H Tiny Chaudhary T, MD   450 mg at 12/29/21 4174   magnesium hydroxide (MILK OF MAGNESIA) suspension 30 mL  30 mL Oral Daily PRN Jaqualin Serpa T, MD       nicotine (NICODERM CQ - dosed in mg/24  hours) patch 21 mg  21 mg Transdermal Q0600 Dorothye Berni T, MD   21 mg at 12/29/21 0754   traZODone (DESYREL) tablet 100 mg  100 mg Oral QHS PRN Deloria Lair, NP   100 mg at 12/28/21 2149    Lab Results: No results found for this or any previous visit (from the past 48 hour(s)).  Blood Alcohol level:  Lab Results  Component Value Date   ETH <10 12/24/2021   ETH <10 06/21/4817    Metabolic Disorder Labs: Lab Results  Component Value Date   HGBA1C 4.9 12/25/2021   MPG 93.93 12/25/2021   MPG 114 06/02/2021   No results found for: PROLACTIN Lab Results  Component Value Date   CHOL 160 12/25/2021   TRIG 95 12/25/2021   HDL 58 12/25/2021   CHOLHDL 2.8 12/25/2021   VLDL 19 12/25/2021   LDLCALC 83 12/25/2021   LDLCALC 63 06/01/2021    Physical Findings: AIMS:  , ,  ,  ,    CIWA:    COWS:     Musculoskeletal: Strength & Muscle Tone: within normal limits Gait & Station: normal Patient leans: N/A  Psychiatric Specialty Exam:  Presentation  General Appearance: Bizarre; Disheveled  Eye Contact:Good  Speech:Pressured; Slurred  Speech Volume:Decreased  Handedness:Right   Mood and Affect  Mood:Euphoric  Affect:Inappropriate; Full Range   Thought Process  Thought Processes:Disorganized  Descriptions of Associations:Tangential  Orientation:Partial  Thought Content:Obsessions; Paranoid Ideation; Illogical  History of Schizophrenia/Schizoaffective disorder:Yes  Duration of Psychotic Symptoms:Greater than six months  Hallucinations:No data recorded Ideas of Reference:Delusions  Suicidal Thoughts:No data recorded Homicidal Thoughts:No data recorded  Sensorium  Memory:Immediate Poor; Recent Poor; Remote Poor  Judgment:Poor  Insight:Poor   Executive Functions  Concentration:Poor  Attention Span:Poor  Recall:Poor  Fund of Knowledge:Poor  Language:Poor   Psychomotor Activity  Psychomotor Activity:No data recorded  Assets   Assets:Communication Skills; Desire for Improvement; Resilience; Social Support   Sleep  Sleep:No data recorded   Physical Exam: Physical Exam Vitals and nursing note reviewed.  Constitutional:      Appearance: Normal appearance.  HENT:     Head: Normocephalic and atraumatic.     Mouth/Throat:     Pharynx: Oropharynx is clear.  Eyes:     Pupils: Pupils are equal, round, and reactive to light.  Cardiovascular:     Rate  and Rhythm: Normal rate and regular rhythm.  Pulmonary:     Effort: Pulmonary effort is normal.     Breath sounds: Normal breath sounds.  Abdominal:     General: Abdomen is flat.     Palpations: Abdomen is soft.  Musculoskeletal:        General: Normal range of motion.  Skin:    General: Skin is warm and dry.  Neurological:     General: No focal deficit present.     Mental Status: She is alert. Mental status is at baseline.  Psychiatric:        Attention and Perception: Attention normal.        Mood and Affect: Mood is anxious. Affect is labile.        Speech: Speech is tangential.        Behavior: Behavior is agitated. Behavior is not aggressive.        Thought Content: Thought content is paranoid and delusional.        Cognition and Memory: Cognition is impaired.        Judgment: Judgment is inappropriate.   Review of Systems  Constitutional: Negative.   HENT: Negative.    Eyes: Negative.   Respiratory: Negative.    Cardiovascular: Negative.   Gastrointestinal: Negative.   Musculoskeletal: Negative.   Skin: Negative.   Neurological: Negative.   Psychiatric/Behavioral:  Positive for depression and hallucinations. Negative for substance abuse and suicidal ideas. The patient is nervous/anxious and has insomnia.   Blood pressure 128/81, pulse 99, temperature 98.2 F (36.8 C), temperature source Oral, resp. rate 17, height 5\' 3"  (1.6 m), weight 44 kg, SpO2 100 %. Body mass index is 17.18 kg/m.   Treatment Plan Summary: Plan check lithium and  Depakote levels tomorrow.  Continue medicine.  Patient is better but not back to her baseline.  Still having psychotic symptoms  Alethia Berthold, MD 12/29/2021, 4:00 PM

## 2021-12-29 NOTE — Progress Notes (Signed)
Patient is calm and cooperative with initial assessment this morning. She denies SI, HI, and AVH. She endorses poor sleep last night, and night shift staff reported that she only slept 2-3 hours. Patient stated she was hungry and asked for Ensure after eating breakfast. MD notified and order obtained. Patient is compliant with scheduled morning medications. She complained of pain in her left ankle and wrist. Tylenol PRN was given. Patient stated her goal was to go to groups today. Patient remains safe on the unit at this time.

## 2021-12-29 NOTE — Progress Notes (Signed)
Pt oob running the hall, pulled her mattress out in the hallway and started doing back flips in the hall without responding to redirection. She started arguing with staff in a taunting manner stating "what are you going to do, I am an adult." Pt then ran to the dayroom and jumped on top of the chair and jumping from one chair to the next. She was redirected and sat down for a few minutes. She was then escorted to her room and she exposed self to a female staff by raising up her shirt. Pt was redirected to the smaller dayroom to watch the TV by herself which she complied. She then asked for some ensure but she has no order. She was offered cereal and milk and she accepted it but changed her mind because she wanted chocolate milk not regular milk. Pt then stomped off to her room and closed her door.  Q15 minutes observations maintained for safety and support provided as needed.

## 2021-12-30 DIAGNOSIS — F312 Bipolar disorder, current episode manic severe with psychotic features: Secondary | ICD-10-CM | POA: Diagnosis not present

## 2021-12-30 NOTE — Plan of Care (Signed)
°  Problem: Education: Goal: Knowledge of [place] General Education information/materials will improve Outcome: Progressing Goal: Verbalization of understanding the information provided will improve Outcome: Progressing   Problem: Activity: Goal: Sleeping patterns will improve Outcome: Not Progressing   Problem: Coping: Goal: Ability to verbalize frustrations and anger appropriately will improve Outcome: Progressing   Problem: Health Behavior/Discharge Planning: Goal: Identification of resources available to assist in meeting health care needs will improve Outcome: Progressing Goal: Compliance with treatment plan for underlying cause of condition will improve Outcome: Progressing   Problem: Physical Regulation: Goal: Ability to maintain clinical measurements within normal limits will improve Outcome: Progressing   Problem: Safety: Goal: Periods of time without injury will increase Outcome: Progressing

## 2021-12-30 NOTE — Progress Notes (Signed)
Recreation Therapy Notes  Date: 12/30/2021  Time: 11:00 am   Location: Courtyard    Behavioral response: Appropriate  Intervention Topic:  Leisure   Discussion/Intervention:  Group content today was focused on leisure. The group defined what leisure is and some positive leisure activities they participate in. Individuals identified the difference between good and bad leisure. Participants expressed how they feel after participating in the leisure of their choice. The group discussed how they go about picking a leisure activity and if others are involved in their leisure activities. The patient stated how many leisure activities they have to choose from and reasons why it is important to have leisure time. Individuals participated in the intervention Exploration of Leisure where they had a chance to identify new leisure activities as well as benefits of leisure. Clinical Observations/Feedback: Patient came to group and was focused on what peers and staff had to say about leisure. She identified listening to happy music as a leisure activity she enjoys. Individual was social with peers and staff while participating in the intervention.   Morayma [place]own LRT/CTRS        Jeanene Mena 12/30/2021 11:55 AM

## 2021-12-30 NOTE — Group Note (Signed)
LCSW Group Therapy Note   Group Date: 12/30/2021 Start Time: 1300 End Time: 1400   Type of Therapy and Topic:  Group Therapy: Boundaries  Participation Level:  Active  Description of Group: This group will address the use of boundaries in their personal lives. Patients will explore why boundaries are important, the difference between healthy and unhealthy boundaries, and negative and postive outcomes of different boundaries and will look at how boundaries can be crossed.  Patients will be encouraged to identify current boundaries in their own lives and identify what kind of boundary is being set. Facilitators will guide patients in utilizing problem-solving interventions to address and correct types boundaries being used and to address when no boundary is being used. Understanding and applying boundaries will be explored and addressed for obtaining and maintaining a balanced life. Patients will be encouraged to explore ways to assertively make their boundaries and needs known to significant others in their lives, using other group members and facilitator for role play, support, and feedback.  Therapeutic Goals:  1.  Patient will identify areas in their life where setting clear boundaries could be  used to improve their life.  2.  Patient will identify signs/triggers that a boundary is not being respected. 3.  Patient will identify two ways to set boundaries in order to achieve balance in  their lives: 4.  Patient will demonstrate ability to communicate their needs and set boundaries  through discussion and/or role plays  Summary of Patient Progress:  Autumn [name] was present/active throughout the session and proved open to feedback from Autumn [name] and peers. Patient demonstrated fair insight into the subject matter, was respectful of peers, and was present throughout the entire session.  Therapeutic Modalities:   Cognitive Behavioral Therapy Solution-Focused Therapy  Autumn [name] 12/30/2021  2:06 PM

## 2021-12-30 NOTE — [facility] Suicide Risk Assessment (Signed)
[place] INPATIENT:  Family/Significant Other Suicide Prevention Education  Suicide Prevention Education:  Education Completed; Autumn [name],  (Mother) has been identified by the patient as the family member/significant other with whom the patient will be residing, and identified as the person(s) who will aid the patient in the event of a mental health crisis (suicidal ideations/suicide attempt).  With written consent from the patient, the family member/significant other has been provided the following suicide prevention education, prior to the and/or following the discharge of the patient.  The suicide prevention education provided includes the following: Suicide risk factors Suicide prevention and interventions National Suicide Hotline telephone number [facility] assessment telephone number [facility] Emergency Assistance [place] and/or Residential Mobile Crisis Unit telephone number  Request made of family/significant other to: Remove weapons (e.g., guns, rifles, knives), all items previously/currently identified as safety concern.   Remove drugs/medications (over-the-counter, prescriptions, illicit drugs), all items previously/currently identified as a safety concern.  The family member/significant other verbalizes understanding of the suicide prevention education information provided.  The family member/significant other agrees to remove the items of safety concern listed above.  Autumn [name] 12/30/2021, 6:19 PM

## 2021-12-30 NOTE — Progress Notes (Signed)
Patient denies pain. Patient rates anxiety 7/10. PRN hydroxyzine provided. Patient denies depression. Patient denies SI/HI/AVH. Patient states "I shouldn't have tried to break into my own house". She explained that the marks on her elbows are from her trying to break into the house.  Patient later rates anxiety 9/10. PRN hydroxyzine provided to patient.  Q15 minute safety checks maintained. Patient remains safe on the unit at this time.

## 2021-12-30 NOTE — Plan of Care (Signed)
Pt visible on the unit, minimal interaction with peers and staff. She denies SI/HI and AVH. Pt delusional, she stated " I am here because I was walking around on the street naked. My daughter is on the inside if me and she wanted me to be close to her, that's why I took off all my clothes." The conversation did not make any sense. She is liable going from smiling to crying. She stated RN and her use to "run the streets together". I presented reality and she started to cry , then apologized for her comment. Pt complained of mouth/tooth pain and got tylenol.  She has anxiety 8/10. She took a shower, but her hair is still matted in the back. She refused assistance with her hair grooming. Problem: Education: Goal: Knowledge of [place] General Education information/materials will improve Outcome: Not Progressing Goal: Emotional status will improve Outcome: Not Progressing Goal: Mental status will improve Outcome: Not Progressing Goal: Verbalization of understanding the information provided will improve Outcome: Not Progressing   Problem: Activity: Goal: Interest or engagement in activities will improve Outcome: Not Progressing Goal: Sleeping patterns will improve Outcome: Not Progressing   Problem: Coping: Goal: Ability to verbalize frustrations and anger appropriately will improve Outcome: Not Progressing Goal: Ability to demonstrate self-control will improve Outcome: Not Progressing   Problem: Health Behavior/Discharge Planning: Goal: Identification of resources available to assist in meeting health care needs will improve Outcome: Not Progressing Goal: Compliance with treatment plan for underlying cause of condition will improve Outcome: Not Progressing   Problem: Physical Regulation: Goal: Ability to maintain clinical measurements within normal limits will improve Outcome: Not Progressing   Problem: Safety: Goal: Periods of time without injury will increase Outcome: Not  Progressing   Problem: Education: Goal: Knowledge of disease or condition will improve Outcome: Not Progressing Goal: Understanding of discharge needs will improve Outcome: Not Progressing   Problem: Health Behavior/Discharge Planning: Goal: Ability to identify changes in lifestyle to reduce recurrence of condition will improve Outcome: Not Progressing Goal: Identification of resources available to assist in meeting health care needs will improve Outcome: Not Progressing   Problem: Physical Regulation: Goal: Complications related to the disease process, condition or treatment will be avoided or minimized Outcome: Not Progressing   Problem: Safety: Goal: Ability to remain free from injury will improve Outcome: Not Progressing

## 2021-12-30 NOTE — Progress Notes (Signed)
[name] Progress Note  12/30/2021 4:31 PM Autumn [name]  MRN:  [id number] Subjective: Follow-up with this patient with bipolar or schizoaffective disorder.  Patient is much less agitated at least during the day but still has trouble sleeping at night.  Still has hallucinations at times.  Disorganized thinking.  Poor judgment.  Nevertheless has been cooperative with medicine.  No new physical complaints Principal Problem: Bipolar affective disorder, current episode manic with psychotic symptoms ([place]) Diagnosis: Principal Problem:   Bipolar affective disorder, current episode manic with psychotic symptoms ([place])  Total Time spent with patient: 30 minutes  Past Psychiatric History: Longstanding history of bipolar disorder or schizoaffective disorder complicated by substance abuse  Past Medical History:  Past Medical History:  Diagnosis Date   Allergy    Seasonal, Ultram (seizures)   Anemia 2005   Bipolar 1 disorder (Autumn [name])    Glaucoma    Seizures ([place]) 2008    Past Surgical History:  Procedure Laterality Date   BREAST EXCISIONAL BIOPSY Right    x 2   CESAREAN SECTION     x 2   LEEP  2000   Family History:  Family History  Problem Relation Age of Onset   Hypertension Mother    Hyperlipidemia Mother    Asthma Mother    Parkinson's disease Father    Lung cancer Maternal Grandmother    Other Paternal Grandfather 19       Cardiac arrest   Heart disease Paternal Grandfather    Tourette syndrome Daughter    Family Psychiatric  History: See previous Social History:  Social History   Substance and Sexual Activity  Alcohol Use Yes   Alcohol/week: 4.0 standard drinks   Types: 4 Cans of beer per week   Comment: Ocassional     Social History   Substance and Sexual Activity  Drug Use Yes   Types: Marijuana   Comment: percocet -last use over 1 week ago. Heroin-last used 2/11    Social History   Socioeconomic History   Marital status: Legally Separated    Spouse name: Not on  file   Number of children: Not on file   Years of education: Not on file   Highest education level: Not on file  Occupational History   Not on file  Tobacco Use   Smoking status: Every Day    Packs/day: 1.00    Years: 9.00    Pack years: 9.00    Types: E-cigarettes, Cigarettes   Smokeless tobacco: Never  Vaping Use   Vaping Use: Never used  Substance and Sexual Activity   Alcohol use: Yes    Alcohol/week: 4.0 standard drinks    Types: 4 Cans of beer per week    Comment: Ocassional   Drug use: Yes    Types: Marijuana    Comment: percocet -last use over 1 week ago. Heroin-last used 2/11   Sexual activity: Yes    Birth control/protection: None  Other Topics Concern   Not on file  Social History Narrative   Not on file   Social Determinants of Health   Financial Resource Strain: Not on file  Food Insecurity: Not on file  Transportation Needs: Not on file  Physical Activity: Not on file  Stress: Not on file  Social Connections: Not on file   Additional Social History:                         Sleep: Fair  Appetite:  Fair  Current Medications: Current Facility-Administered Medications  Medication Dose Route Frequency Provider Last Rate Last Admin   acetaminophen (TYLENOL) tablet 650 mg  650 mg Oral Q6H PRN Balen Woolum T, MD   650 mg at 12/30/21 1143   alum & mag hydroxide-simeth (MAALOX/MYLANTA) 200-200-20 MG/5ML suspension 30 mL  30 mL Oral Q4H PRN Justyn Boyson T, MD       ARIPiprazole (ABILIFY) tablet 30 mg  30 mg Oral Daily Jamesina Gaugh T, MD   30 mg at 12/30/21 9833   benztropine (COGENTIN) tablet 1 mg  1 mg Oral Daily Ahmed Inniss T, MD   1 mg at 12/30/21 8250   divalproex (DEPAKOTE) DR tablet 500 mg  500 mg Oral Q12H Kristain Hu T, MD   500 mg at 12/30/21 5397   feeding supplement (ENSURE ENLIVE / ENSURE PLUS) liquid 237 mL  237 mL Oral BID BM Toya Palacios T, MD   237 mL at 12/30/21 1344   hydrOXYzine (ATARAX) tablet 50 mg  50 mg Oral Q6H PRN  Alley Neils T, MD   50 mg at 12/30/21 1444   lithium carbonate (ESKALITH) CR tablet 450 mg  450 mg Oral Q12H Iyah Laguna T, MD   450 mg at 12/30/21 6734   magnesium hydroxide (MILK OF MAGNESIA) suspension 30 mL  30 mL Oral Daily PRN Nance Mccombs T, MD       nicotine (NICODERM CQ - dosed in mg/24 hours) patch 21 mg  21 mg Transdermal Q0600 Curry Dulski T, MD   21 mg at 12/30/21 1937   traZODone (DESYREL) tablet 100 mg  100 mg Oral QHS PRN Deloria Lair, NP   100 mg at 12/29/21 2103    Lab Results: No results found for this or any previous visit (from the past 48 hour(s)).  Blood Alcohol level:  Lab Results  Component Value Date   ETH <10 12/24/2021   ETH <10 90/24/0973    Metabolic Disorder Labs: Lab Results  Component Value Date   HGBA1C 4.9 12/25/2021   MPG 93.93 12/25/2021   MPG 114 06/02/2021   No results found for: PROLACTIN Lab Results  Component Value Date   CHOL 160 12/25/2021   TRIG 95 12/25/2021   HDL 58 12/25/2021   CHOLHDL 2.8 12/25/2021   VLDL 19 12/25/2021   LDLCALC 83 12/25/2021   LDLCALC 63 06/01/2021    Physical Findings: AIMS:  , ,  ,  ,    CIWA:    COWS:     Musculoskeletal: Strength & Muscle Tone: within normal limits Gait & Station: normal Patient leans: N/A  Psychiatric Specialty Exam:  Presentation  General Appearance: Bizarre; Disheveled  Eye Contact:Good  Speech:Pressured; Slurred  Speech Volume:Decreased  Handedness:Right   Mood and Affect  Mood:Euphoric  Affect:Inappropriate; Full Range   Thought Process  Thought Processes:Disorganized  Descriptions of Associations:Tangential  Orientation:Partial  Thought Content:Obsessions; Paranoid Ideation; Illogical  History of Schizophrenia/Schizoaffective disorder:Yes  Duration of Psychotic Symptoms:Greater than six months  Hallucinations:No data recorded Ideas of Reference:Delusions  Suicidal Thoughts:No data recorded Homicidal Thoughts:No data  recorded  Sensorium  Memory:Immediate Poor; Recent Poor; Remote Poor  Judgment:Poor  Insight:Poor   Executive Functions  Concentration:Poor  Attention Span:Poor  Recall:Poor  Fund of Knowledge:Poor  Language:Poor   Psychomotor Activity  Psychomotor Activity:No data recorded  Assets  Assets:Communication Skills; Desire for Improvement; Resilience; Social Support   Sleep  Sleep:No data recorded   Physical Exam: Physical Exam Vitals and nursing note reviewed.  HENT:  Head: Normocephalic and atraumatic.     Mouth/Throat:     Pharynx: Oropharynx is clear.  Eyes:     Pupils: Pupils are equal, round, and reactive to light.  Cardiovascular:     Rate and Rhythm: Normal rate and regular rhythm.  Pulmonary:     Effort: Pulmonary effort is normal.     Breath sounds: Normal breath sounds.  Abdominal:     General: Abdomen is flat.     Palpations: Abdomen is soft.  Musculoskeletal:        General: Normal range of motion.  Skin:    General: Skin is warm and dry.  Neurological:     General: No focal deficit present.     Mental Status: She is alert. Mental status is at baseline.  Psychiatric:        Attention and Perception: She is inattentive.        Mood and Affect: Mood normal. Affect is blunt.        Speech: Speech is delayed.        Behavior: Behavior is slowed.        Thought Content: Thought content is paranoid and delusional.        Cognition and Memory: She exhibits impaired recent memory.   Review of Systems  Constitutional: Negative.   HENT: Negative.    Eyes: Negative.   Respiratory: Negative.    Cardiovascular: Negative.   Gastrointestinal: Negative.   Musculoskeletal: Negative.   Skin: Negative.   Neurological: Negative.   Psychiatric/Behavioral:  Positive for depression and substance abuse. The patient is nervous/anxious and has insomnia.   Blood pressure 104/64, pulse 87, temperature 98 F (36.7 C), temperature source Oral, resp. rate 18,  height 5\' 3"  (1.6 m), weight 44 kg, SpO2 100 %. Body mass index is 17.18 kg/m.   Treatment Plan Summary: Medication management and Plan no change to medication for today.  Check lithium and Depakote levels tomorrow.  Supportive therapy and encouragement.  Alethia Berthold, MD 12/30/2021, 4:31 PM

## 2021-12-31 DIAGNOSIS — F312 Bipolar disorder, current episode manic severe with psychotic features: Secondary | ICD-10-CM | POA: Diagnosis not present

## 2021-12-31 LAB — VALPROIC ACID LEVEL: Valproic Acid Lvl: 69 ug/mL (ref 50.0–100.0)

## 2021-12-31 LAB — LITHIUM LEVEL: Lithium Lvl: 0.58 mmol/L — ABNORMAL LOW (ref 0.60–1.20)

## 2021-12-31 MED ORDER — DIVALPROEX SODIUM 500 MG PO DR TAB
750.0000 mg | DELAYED_RELEASE_TABLET | Freq: Two times a day (BID) | ORAL | Status: DC
  Administered 2021-12-31 – 2022-01-08 (×16): 750 mg via ORAL
  Filled 2021-12-31 (×16): qty 1

## 2021-12-31 MED ORDER — LITHIUM CARBONATE ER 300 MG PO TBCR
600.0000 mg | EXTENDED_RELEASE_TABLET | Freq: Two times a day (BID) | ORAL | Status: DC
  Administered 2021-12-31 – 2022-01-08 (×16): 600 mg via ORAL
  Filled 2021-12-31 (×16): qty 2

## 2021-12-31 MED ORDER — QUETIAPINE FUMARATE 100 MG PO TABS
100.0000 mg | ORAL_TABLET | Freq: Every day | ORAL | Status: DC
  Administered 2021-12-31 – 2022-01-02 (×3): 100 mg via ORAL
  Filled 2021-12-31 (×3): qty 1

## 2021-12-31 NOTE — Plan of Care (Signed)
°  Problem: Education: Goal: Emotional status will improve Outcome: Progressing Goal: Mental status will improve Outcome: Progressing   Problem: Activity: Goal: Sleeping patterns will improve Outcome: Not Progressing   Problem: Coping: Goal: Ability to verbalize frustrations and anger appropriately will improve Outcome: Progressing

## 2021-12-31 NOTE — Plan of Care (Signed)
Patient presents A&O x4, disheveled with anxious affect.  Rates anxiety 8/10 and depression 2/10.  Atarax prn given with relief reported.  Denies AVH, SI or HI.   Tylenol given for 8/10 right rib plain with relief reported.  Patient compliant with all scheduled meds.  VSS.  Ongoing Q15 minute safety check rounds per unit protocol.  Problem: Education: Goal: Knowledge of [facility] Education information/materials will improve Outcome: Progressing Goal: Emotional status will improve Outcome: Progressing Goal: Mental status will improve Outcome: Progressing   Problem: Activity: Goal: Sleeping patterns will improve Outcome: Progressing   Problem: Health Behavior/Discharge Planning: Goal: Compliance with treatment plan for underlying cause of condition will improve Outcome: Progressing
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
## 2021-12-31 NOTE — Group Note (Signed)
[facility] LCSW Group Therapy Note   Group Date: 12/31/2021 Start Time: 1300 End Time: 1400  Type of Therapy/Topic:  Group Therapy:  Feelings about Diagnosis  Participation Level:  Active   Mood: euthymic     Description of Group:    This group will allow patients to explore their thoughts and feelings about diagnoses they have received. Patients will be guided to explore their level of understanding and acceptance of these diagnoses. Facilitator will encourage patients to process their thoughts and feelings about the reactions of others to their diagnosis, and will guide patients in identifying ways to discuss their diagnosis with significant others in their lives. This group will be process-oriented, with patients participating in exploration of their own experiences as well as giving and receiving support and challenge from other group members.   Therapeutic Goals: 1. Patient will demonstrate understanding of diagnosis as evidence by identifying two or more symptoms of the disorder:  2. Patient will be able to express two feelings regarding the diagnosis 3. Patient will demonstrate ability to communicate their needs through discussion and/or role plays  Summary of Patient Progress: Patient was present for the entirety of the group session. Patient was an active listener and participated in the topic of discussion, provided helpful advice to others, and added nuance to topic of conversation. Patient has improved in her ability to engage appropriately, albeit some tangential remarks. Patient states is it true that people with a mental health diagnosis can not buy a box cutter. Patient was redirected successfully.     Therapeutic Modalities:   Cognitive Behavioral Therapy Brief Therapy Feelings Identification    Durenda Hurt, [place]
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
## 2021-12-31 NOTE — Progress Notes (Signed)
Started new medication regimen tonight. Received without incident. Clear and logical thought as she discusses not being on her medication and the need to be med compliant. Discussed her self medicating with street drugs instead prescribed medication. She denies si hi avh, does endorse depression and high anxiety.  Also received Tylenol for tooth pain. Bottom gum appears to have abscess, discussed oral hygiene to which she politely refused. Said she will let all of her current teeth fall out so she can grow more. Poor insight on the need for, and importance of oral care. Encourage her to seek staff with any concerns or if mouth pain worsens. Will continue to monitor with q15 minute safety rounds.   C butler-Nicholson, LPN

## 2021-12-31 NOTE — Progress Notes (Signed)
Recreation Therapy Notes  Date: 12/31/2021  Time: 10:30 am  Location: Courtyard    Behavioral response: Appropriate  Intervention Topic:  Social skills    Discussion/Intervention:  Group content on today was focused on social skills. The group defined social skills and identified ways they use social skills. Patients expressed what obstacles they face when trying to be social. Participants described the importance of social skills. The group listed ways to improve social skills and reasons to improve social skills. Individuals had an opportunity to learn new and improve social skills as well as identify their weaknesses.  Clinical Observations/Feedback: Patient came to group and was able to identify social skills and how they participate in socialization. Individual was social with peers and staff while participating in the intervention.   Francia Verry LRT/CTRS             Demarus Latterell 12/31/2021 12:00 PM

## 2022-01-01 DIAGNOSIS — F312 Bipolar disorder, current episode manic severe with psychotic features: Secondary | ICD-10-CM | POA: Diagnosis not present

## 2022-01-01 NOTE — Group Note (Signed)
[facility] LCSW Group Therapy Note   Group Date: 01/01/2022 Start Time: 1300 End Time: 1400  Type of Therapy and Topic:  Group Therapy:  Feelings around Relapse and Recovery  Participation Level:  Active   Mood: Euthyic   Description of Group:    Patients in this group will discuss emotions they experience before and after a relapse. They will process how experiencing these feelings, or avoidance of experiencing them, relates to having a relapse. Facilitator will guide patients to explore emotions they have related to recovery. Patients will be encouraged to process which emotions are more powerful. They will be guided to discuss the emotional reaction significant others in their lives may have to patients relapse or recovery. Patients will be assisted in exploring ways to respond to the emotions of others without this contributing to a relapse.  Therapeutic Goals: Patient will identify two or more emotions that lead to relapse for them:  Patient will identify two emotions that result when they relapse:  Patient will identify two emotions related to recovery:  Patient will demonstrate ability to communicate their needs through discussion and/or role plays.   Summary of Patient Progress:  Patient was present for the entirety of the group session. Patient required often redirection. Was largely tangential in conversation.   Therapeutic Modalities:   Cognitive Behavioral Therapy Solution-Focused Therapy Assertiveness Training Relapse Prevention Therapy   Durenda Hurt, [place]

## 2022-01-01 NOTE — Progress Notes (Signed)
Patient approached nurses station requesting PRN medication for anxiety and Tylenol for tooth pain.  Received medication and tolerated without incident. Will continue to monitor with safety rounds. Encourage her to seek staff with any concerns.    Genia Del, LPN

## 2022-01-01 NOTE — Progress Notes (Signed)
[facility] MD Progress Note  01/01/2022 2:59 PM Autumn [name]  MRN:  [id number] Subjective: Follow-up for this 46 year old woman with bipolar disorder.  Patient slept slightly better last night.  Affect and mood is been calm her today.Denies having any hallucinations denies any thoughts of harming herself.  No thoughts of harming anyone else Principal Problem: Bipolar affective disorder, current episode manic with psychotic symptoms ([place]) Diagnosis: Principal Problem:   Bipolar affective disorder, current episode manic with psychotic symptoms ([place])  Total Time spent with patient: 30 minutes  Past Psychiatric History: Past history of bipolar disorder complicated by substance abuse  Past Medical History:  Past Medical History:  Diagnosis Date   Allergy    Seasonal, Ultram (seizures)   Anemia 2005   Bipolar 1 disorder ([place])    Glaucoma    Seizures ([place]) 2008    Past Surgical History:  Procedure Laterality Date   BREAST EXCISIONAL BIOPSY Right    x 2   CESAREAN SECTION     x 2   LEEP  2000   Family History:  Family History  Problem Relation Age of Onset   Hypertension Mother    Hyperlipidemia Mother    Asthma Mother    Parkinson's disease Father    Lung cancer Maternal Grandmother    Other Paternal Grandfather 21       Cardiac arrest   Heart disease Paternal Grandfather    Tourette syndrome Daughter    Family Psychiatric  History: See previous Social History:  Social History   Substance and Sexual Activity  Alcohol Use Yes   Alcohol/week: 4.0 standard drinks   Types: 4 Cans of beer per week   Comment: Ocassional     Social History   Substance and Sexual Activity  Drug Use Yes   Types: Marijuana   Comment: percocet -last use over 1 week ago. Heroin-last used 2/11    Social History   Socioeconomic History   Marital status: Legally Separated    Spouse name: Not on file   Number of children: Not on file   Years of education: Not on file   Highest education level:  Not on file  Occupational History   Not on file  Tobacco Use   Smoking status: Every Day    Packs/day: 1.00    Years: 9.00    Pack years: 9.00    Types: E-cigarettes, Cigarettes   Smokeless tobacco: Never  Vaping Use   Vaping Use: Never used  Substance and Sexual Activity   Alcohol use: Yes    Alcohol/week: 4.0 standard drinks    Types: 4 Cans of beer per week    Comment: Ocassional   Drug use: Yes    Types: Marijuana    Comment: percocet -last use over 1 week ago. Heroin-last used 2/11   Sexual activity: Yes    Birth control/protection: None  Other Topics Concern   Not on file  Social History Narrative   Not on file   Social Determinants of Health   Financial Resource Strain: Not on file  Food Insecurity: Not on file  Transportation Needs: Not on file  Physical Activity: Not on file  Stress: Not on file  Social Connections: Not on file   Additional Social History:                         Sleep: Fair  Appetite:  Fair  Current Medications: Current Facility-Administered Medications  Medication Dose Route Frequency Provider Last Rate  Last Admin   acetaminophen (TYLENOL) tablet 650 mg  650 mg Oral Q6H PRN Lilian Fuhs T, MD   650 mg at 01/01/22 0216   alum & mag hydroxide-simeth (MAALOX/MYLANTA) 200-200-20 MG/5ML suspension 30 mL  30 mL Oral Q4H PRN Kashawn Manzano T, MD       ARIPiprazole (ABILIFY) tablet 30 mg  30 mg Oral Daily Carrah Eppolito T, MD   30 mg at 01/01/22 0725   benztropine (COGENTIN) tablet 1 mg  1 mg Oral Daily Tyner Codner T, MD   1 mg at 12/31/21 0737   divalproex (DEPAKOTE) DR tablet 750 mg  750 mg Oral Q12H Addisen Chappelle T, MD   750 mg at 01/01/22 4627   feeding supplement (ENSURE ENLIVE / ENSURE PLUS) liquid 237 mL  237 mL Oral BID BM Versa Craton T, MD   237 mL at 01/01/22 1405   hydrOXYzine (ATARAX) tablet 50 mg  50 mg Oral Q6H PRN Alden Feagan T, MD   50 mg at 01/01/22 1404   lithium carbonate (LITHOBID) CR tablet 600 mg  600 mg  Oral Q12H Charissa Knowles T, MD   600 mg at 01/01/22 0725   magnesium hydroxide (MILK OF MAGNESIA) suspension 30 mL  30 mL Oral Daily PRN Delando Satter, [name], MD       nicotine (NICODERM CQ - dosed in mg/24 hours) patch 21 mg  21 mg Transdermal Q0600 Shaquanta Harkless T, MD   21 mg at 01/01/22 0800   QUEtiapine (SEROQUEL) tablet 100 mg  100 mg Oral QHS Deeya Richeson T, MD   100 mg at 12/31/21 2134    Lab Results:  Results for orders placed or performed during the hospital encounter of 12/25/21 (from the past 48 hour(s))  Lithium level     Status: Abnormal   Collection Time: 12/31/21  6:20 AM  Result Value Ref Range   Lithium Lvl 0.58 (L) 0.60 - 1.20 mmol/L    Comment: Performed at [facility], [address] [place], [place] [address]  Valproic acid level     Status: None   Collection Time: 12/31/21  6:20 AM  Result Value Ref Range   Valproic Acid Lvl 69 50.0 - 100.0 ug/mL    Comment: Performed at [facility], [address] [place], [place] [address]    Blood Alcohol level:  Lab Results  Component Value Date   [facility] <10 12/24/2021   ETH <10 29/93/7169    Metabolic Disorder Labs: Lab Results  Component Value Date   HGBA1C 4.9 12/25/2021   MPG 93.93 12/25/2021   MPG 114 06/02/2021   No results found for: PROLACTIN Lab Results  Component Value Date   CHOL 160 12/25/2021   TRIG 95 12/25/2021   HDL 58 12/25/2021   CHOLHDL 2.8 12/25/2021   VLDL 19 12/25/2021   LDLCALC 83 12/25/2021   LDLCALC 63 06/01/2021    Physical Findings: AIMS:  , ,  ,  ,    CIWA:    COWS:     Musculoskeletal: Strength & Muscle Tone: within normal limits Gait & Station: normal Patient leans: N/A  Psychiatric Specialty Exam:  Presentation  General Appearance: Bizarre; Disheveled  Eye Contact:Good  Speech:Pressured; Slurred  Speech Volume:Decreased  Handedness:Right   Mood and Affect  Mood:Euphoric  Affect:Inappropriate; Full Range   Thought Process  Thought  Processes:Disorganized  Descriptions of Associations:Tangential  Orientation:Partial  Thought Content:Obsessions; Paranoid Ideation; Illogical  History of Schizophrenia/Schizoaffective disorder:Yes  Duration of Psychotic Symptoms:Greater than six  months  Hallucinations:No data recorded Ideas of Reference:Delusions  Suicidal Thoughts:No data recorded Homicidal Thoughts:No data recorded  Sensorium  Memory:Immediate Poor; Recent Poor; Remote Poor  Judgment:Poor  Insight:Poor   Executive Functions  Concentration:Poor  Attention Span:Poor  Recall:Poor  Fund of Knowledge:Poor  Language:Poor   Psychomotor Activity  Psychomotor Activity:No data recorded  Assets  Assets:Communication Skills; Desire for Improvement; Resilience; Social Support   Sleep  Sleep:No data recorded   Physical Exam: Physical Exam Constitutional:      Appearance: Normal appearance.  HENT:     Head: Normocephalic and atraumatic.     Mouth/Throat:     Pharynx: Oropharynx is clear.  Eyes:     Pupils: Pupils are equal, round, and reactive to light.  Cardiovascular:     Rate and Rhythm: Normal rate and regular rhythm.  Pulmonary:     Effort: Pulmonary effort is normal.     Breath sounds: Normal breath sounds.  Abdominal:     General: Abdomen is flat.     Palpations: Abdomen is soft.  Musculoskeletal:        General: Normal range of motion.  Skin:    General: Skin is warm and dry.  Neurological:     General: No focal deficit present.     Mental Status: She is alert. Mental status is at baseline.  Psychiatric:        Attention and Perception: Attention normal.        Mood and Affect: Mood normal. Affect is blunt.        Speech: Speech is delayed.        Behavior: Behavior is slowed.        Thought Content: Thought content normal.        Cognition and Memory: Cognition is impaired. Memory is impaired.   Review of Systems  Constitutional: Negative.   HENT: Negative.    Eyes:  Negative.   Respiratory: Negative.    Cardiovascular: Negative.   Gastrointestinal: Negative.   Musculoskeletal: Negative.   Skin: Negative.   Neurological: Negative.   Psychiatric/Behavioral:  The patient is nervous/anxious.   Blood pressure 109/80, pulse 93, temperature 98.7 F (37.1 C), temperature source Oral, resp. rate 18, height 5\' 3"  (1.6 m), weight 44 kg, SpO2 100 %. Body mass index is 17.18 kg/m.   Treatment Plan Summary: Medication management and Plan substitution of Seroquel for trazodone to help with sleep seems to have been helpful.  Plan to continue current doses of medicine.  Just increased lithium and Depakote yesterday.  Patient is showing gradual improvement.  Alethia Berthold, MD 01/01/2022, 2:59 PM

## 2022-01-01 NOTE — Plan of Care (Signed)
Patient presents disheveled with anxious affect.  Rates anxiety 10/10 and depression 2/10.  Atarax offered; pt declined.  Denies AVH, SI or HI.  Tylenol given for 8/10 mouth/ gum pain due to poor dental hygeine with relief reported.  Reports sleeping poor despite new night meds.  Patient compliant with all scheduled meds this shift except Cogentin- MD aware.  VSS.  Patient states her goal for the day is to go to groups.  Ongoing Q15 minute safety check rounds per unit protocol.  Problem: Education: Goal: Knowledge of [facility] Education information/materials will improve Outcome: Progressing Goal: Emotional status will improve Outcome: Progressing Goal: Mental status will improve Outcome: Progressing Goal: Verbalization of understanding the information provided will improve Outcome: Progressing   Problem: Activity: Goal: Interest or engagement in activities will improve Outcome: Progressing   Problem: Health Behavior/Discharge Planning: Goal: Compliance with treatment plan for underlying cause of condition will improve Outcome: Progressing   Problem: Activity: Goal: Sleeping patterns will improve Outcome: Not Progressing

## 2022-01-01 NOTE — Progress Notes (Signed)
Recreation Therapy Notes   Date: 01/01/2022  Time: 10:10 am    Location: Courtyard    Behavioral response: Appropriate  Intervention Topic:  Relaxation    Discussion/Intervention:  Group content today was focused on relaxation. The group defined relaxation and identified healthy ways to relax. Individuals expressed how much time they spend relaxing. Patients expressed how much their life would be if they did not make time for themselves to relax. The group stated ways they could improve their relaxation techniques in the future.  Individuals participated in the intervention Time to Relax where they had a chance to experience different relaxation techniques.   Clinical Observations/Feedback: Patient came to group and was focused on what peers and staff had to say about relaxation. She stated that she relaxes when she knows she can be herself. Individual was social with peers and staff while participating in the intervention.   Kamica Florance LRT/CTRS        Catlyn Shipton 01/01/2022 12:05 PM

## 2022-01-02 DIAGNOSIS — F312 Bipolar disorder, current episode manic severe with psychotic features: Secondary | ICD-10-CM | POA: Diagnosis not present

## 2022-01-02 MED ORDER — DIPHENHYDRAMINE HCL 25 MG PO CAPS: 50.0000 mg | ORAL_CAPSULE | Freq: Four times a day (QID) | ORAL | Status: DC | PRN

## 2022-01-02 MED ORDER — LORAZEPAM 2 MG PO TABS
2.0000 mg | ORAL_TABLET | ORAL | Status: DC | PRN
Start: 2022-01-02 — End: 2022-01-08
  Administered 2022-01-03 – 2022-01-07 (×5): 2 mg via ORAL
  Filled 2022-01-02 (×5): qty 1

## 2022-01-02 MED ORDER — LORAZEPAM 2 MG/ML IJ SOLN
2.0000 mg | INTRAMUSCULAR | Status: DC | PRN
Start: 2022-01-02 — End: 2022-01-08
  Administered 2022-01-03: 2 mg via INTRAMUSCULAR
  Filled 2022-01-02: qty 1

## 2022-01-02 MED ORDER — DIPHENHYDRAMINE HCL 50 MG/ML IJ SOLN
50.0000 mg | Freq: Four times a day (QID) | INTRAMUSCULAR | Status: DC | PRN
  Administered 2022-01-03: 50 mg via INTRAMUSCULAR
  Filled 2022-01-02: qty 1

## 2022-01-02 NOTE — Progress Notes (Signed)
Patient received nicotine patch.    C Butler-Nicholson, LPN

## 2022-01-02 NOTE — Progress Notes (Signed)
Patient approached nurses station requesting PRN medication. Reports feeling anxious and unable to sleep. Received medication, tolerated without incident. Will continue to monitor with q 15 minte safety rounds, Encouraged to come to staff if symptoms worsen.    Cleo Butler-Nicholson, LPN

## 2022-01-02 NOTE — Progress Notes (Signed)
Patient approached nurses station requesting Tylenol for mouth pain. Received without incident. Will continue to monitor q 15 minute safety checks.    C Butler-Nicholson, LPN

## 2022-01-02 NOTE — BH IP Treatment Plan (Signed)
Interdisciplinary Treatment and Diagnostic Plan Update  01/02/2022 Time of Session: 0930 Andersyn Fragoso MRN: [id number]  Principal Diagnosis: Bipolar affective disorder, current episode manic with psychotic symptoms ([place])  Secondary Diagnoses: Principal Problem:   Bipolar affective disorder, current episode manic with psychotic symptoms ([place])   Current Medications:  Current Facility-Administered Medications  Medication Dose Route Frequency Provider Last Rate Last Admin   acetaminophen (TYLENOL) tablet 650 mg  650 mg Oral Q6H PRN Clapacs, [name], MD   650 mg at 01/02/22 0823   alum & mag hydroxide-simeth (MAALOX/MYLANTA) 200-200-20 MG/5ML suspension 30 mL  30 mL Oral Q4H PRN Clapacs, [name], MD       ARIPiprazole (ABILIFY) tablet 30 mg  30 mg Oral Daily Clapacs, [name], MD   30 mg at 01/02/22 4270   benztropine (COGENTIN) tablet 1 mg  1 mg Oral Daily Clapacs, [name], MD   1 mg at 01/02/22 0824   divalproex (DEPAKOTE) DR tablet 750 mg  750 mg Oral Q12H Clapacs, [name], MD   750 mg at 01/02/22 0824   feeding supplement (ENSURE ENLIVE / ENSURE PLUS) liquid 237 mL  237 mL Oral BID BM Clapacs, [name], MD   237 mL at 01/02/22 0914   hydrOXYzine (ATARAX) tablet 50 mg  50 mg Oral Q6H PRN Clapacs, [name], MD   50 mg at 01/02/22 0911   lithium carbonate (LITHOBID) CR tablet 600 mg  600 mg Oral Q12H Clapacs, [name], MD   600 mg at 01/02/22 6237   magnesium hydroxide (MILK OF MAGNESIA) suspension 30 mL  30 mL Oral Daily PRN Clapacs, [name], MD       nicotine (NICODERM CQ - dosed in mg/24 hours) patch 21 mg  21 mg Transdermal Q0600 Clapacs, [name], MD   21 mg at 01/02/22 0603   QUEtiapine (SEROQUEL) tablet 100 mg  100 mg Oral QHS Clapacs, [name], MD   100 mg at 01/01/22 2109   PTA Medications: Medications Prior to Admission  Medication Sig Dispense Refill Last Dose   ARIPiprazole (ABILIFY) 10 MG tablet Take 1 tablet (10 mg total) by mouth daily. (Patient not taking: Reported on 12/24/2021) 30 tablet 1     divalproex (DEPAKOTE) 250 MG DR tablet Take 1 tablet (250 mg total) by mouth in the morning. (Patient not taking: Reported on 12/24/2021) 30 tablet 1    divalproex (DEPAKOTE) 500 MG DR tablet Take 1 tablet (500 mg total) by mouth at bedtime. (Patient not taking: Reported on 12/24/2021) 30 tablet 1    hydrOXYzine (ATARAX/VISTARIL) 25 MG tablet Take 1 tablet (25 mg total) by mouth 3 (three) times daily as needed for itching (itching, rash). (Patient not taking: Reported on 12/24/2021) 60 tablet 1    traZODone (DESYREL) 50 MG tablet Take 1 tablet (50 mg total) by mouth at bedtime as needed for sleep. (Patient not taking: Reported on 12/24/2021) 30 tablet 1     Patient Stressors: Health problems   Medication change or noncompliance   Substance abuse   Traumatic event    Patient Strengths: Ability for insight  Supportive family/friends   Treatment Modalities: Medication Management, Group therapy, Case management,  1 to 1 session with clinician, Psychoeducation, Recreational therapy.   Physician Treatment Plan for Primary Diagnosis: Bipolar affective disorder, current episode manic with psychotic symptoms ([place]) Long Term Goal(s): Improvement in symptoms so as ready for discharge   Short Term Goals: Ability to identify changes in lifestyle to reduce recurrence of condition  will improve Ability to verbalize feelings will improve Ability to disclose and discuss suicidal ideas Ability to demonstrate self-control will improve Ability to identify and develop effective coping behaviors will improve Ability to maintain clinical measurements within normal limits will improve Compliance with prescribed medications will improve Ability to identify triggers associated with substance abuse/mental health issues will improve  Medication Management: Evaluate patient's response, side effects, and tolerance of medication regimen.  Therapeutic Interventions: 1 to 1 sessions, Unit Group sessions and Medication  administration.  Evaluation of Outcomes: Progressing  Physician Treatment Plan for Secondary Diagnosis: Principal Problem:   Bipolar affective disorder, current episode manic with psychotic symptoms ([place])  Long Term Goal(s): Improvement in symptoms so as ready for discharge   Short Term Goals: Ability to identify changes in lifestyle to reduce recurrence of condition will improve Ability to verbalize feelings will improve Ability to disclose and discuss suicidal ideas Ability to demonstrate self-control will improve Ability to identify and develop effective coping behaviors will improve Ability to maintain clinical measurements within normal limits will improve Compliance with prescribed medications will improve Ability to identify triggers associated with substance abuse/mental health issues will improve     Medication Management: Evaluate patient's response, side effects, and tolerance of medication regimen.  Therapeutic Interventions: 1 to 1 sessions, Unit Group sessions and Medication administration.  Evaluation of Outcomes: Progressing   RN Treatment Plan for Primary Diagnosis: Bipolar affective disorder, current episode manic with psychotic symptoms ([place]) Long Term Goal(s): Knowledge of disease and therapeutic regimen to maintain health will improve  Short Term Goals: Ability to remain free from injury will improve, Ability to verbalize frustration and anger appropriately will improve, Ability to demonstrate self-control, Ability to participate in decision making will improve, Ability to verbalize feelings will improve, Ability to disclose and discuss suicidal ideas, Ability to identify and develop effective coping behaviors will improve, and Compliance with prescribed medications will improve  Medication Management: RN will administer medications as ordered by provider, will assess and evaluate patient's response and provide education to patient for prescribed medication. RN will  report any adverse and/or side effects to prescribing provider.  Therapeutic Interventions: 1 on 1 counseling sessions, Psychoeducation, Medication administration, Evaluate responses to treatment, Monitor vital signs and CBGs as ordered, Perform/monitor CIWA, COWS, AIMS and Fall Risk screenings as ordered, Perform wound care treatments as ordered.  Evaluation of Outcomes: Progressing   LCSW Treatment Plan for Primary Diagnosis: Bipolar affective disorder, current episode manic with psychotic symptoms ([place]) Long Term Goal(s): Safe transition to appropriate next level of care at discharge, Engage patient in therapeutic group addressing interpersonal concerns.  Short Term Goals: Engage patient in aftercare planning with referrals and resources, Increase social support, Increase ability to appropriately verbalize feelings, Increase emotional regulation, Facilitate acceptance of mental health diagnosis and concerns, Facilitate patient progression through stages of change regarding substance use diagnoses and concerns, Identify triggers associated with mental health/substance abuse issues, and Increase skills for wellness and recovery  Therapeutic Interventions: Assess for all discharge needs, 1 to 1 time with Social worker, Explore available resources and support systems, Assess for adequacy in community support network, Educate family and significant other(s) on suicide prevention, Complete Psychosocial Assessment, Interpersonal group therapy.  Evaluation of Outcomes: Progressing   Progress in Treatment: Attending groups: Yes. Participating in groups: Yes. Taking medication as prescribed: Yes. Toleration medication: Yes. Family/Significant other contact made: Yes, individual(s) contacted:  Uvaldo Rising, Mother.  Patient understands diagnosis: Yes. Discussing patient identified problems/goals with staff: Yes. Medical problems  stabilized or resolved: Yes. Denies suicidal/homicidal ideation:  Yes. Issues/concerns per patient self-inventory: Yes. Other: none   New problem(s) identified: No, Describe:  No additional problems identified, patient has made progress in elimination of psychotic features, though remains largely disorganized and tangential in conversation.  New Short Term/Long Term Goal(s): Patient to work towards detox, elimination of symptoms of psychosis, medication management for mood stabilization; development of comprehensive mental wellness/sobriety plan.  Patient Goals:  No additional goals identified at this time.  Discharge Plan or Barriers: No psychosocial barriers identified at this time, patient to return to place of residence when appropriate for discharge.   Reason for Continuation of Hospitalization: Delusions  Other; describe Disorganized thoughts/behaviors.   Estimated Length of Stay: 1-7 days   Scribe for Treatment Team: Larose Kells 01/02/2022 9:20 AM

## 2022-01-02 NOTE — Progress Notes (Signed)
Patient is active on the unit. Engages well with others. She is med compliant and received her QHS medications without incident.  She reported not sleeping well the night before, but reports noise from the overhead intercom and other patients on the unit kept her from sleeping.  Will report in the morning if new  medication regimen has helped her sleep better. Received QHS medication and tolerated without incident. Denies si  hi avh depression at this encounter. Continues to endorse anxiety and pain. Will continue to monitor with q 15 minte safety checks.    C Butler-Nicholson, LPN

## 2022-01-02 NOTE — Progress Notes (Signed)
Patient rates pain 9/10. Patient rates anxiety 9/10. PRN anxiety medication provided to patient throughout shift. Patient denies depression.  Patient denies SI/HI/AVH. Patient cooperative during assessment. Patient compliant during medication administration.  Patient endorses some delusions stating that Buddy was outside her window and she told him not to come back telepathically. Patient has not endorsed any other delusions.  Q15 minute safety checks maintained. Patient remains safe on the unit at this time.

## 2022-01-02 NOTE — Plan of Care (Signed)
°  Problem: Education: Goal: Knowledge of [facility] Education information/materials will improve Outcome: Progressing Goal: Emotional status will improve Outcome: Progressing Goal: Mental status will improve Outcome: Progressing Goal: Verbalization of understanding the information provided will improve Outcome: Progressing   Problem: Activity: Goal: Interest or engagement in activities will improve Outcome: Progressing Goal: Sleeping patterns will improve Outcome: Progressing   Problem: Coping: Goal: Ability to verbalize frustrations and anger appropriately will improve Outcome: Progressing Goal: Ability to demonstrate self-control will improve Outcome: Progressing   

## 2022-01-02 NOTE — Group Note (Signed)
LCSW Group Therapy Note   Group Date: 01/02/2022 Start Time: 1300 End Time: 1400   Type of Therapy and Topic:  Group Therapy: Boundaries  Participation Level:  Did Not Attend  Description of Group: This group will address the use of boundaries in their personal lives. Patients will explore why boundaries are important, the difference between healthy and unhealthy boundaries, and negative and postive outcomes of different boundaries and will look at how boundaries can be crossed.  Patients will be encouraged to identify current boundaries in their own lives and identify what kind of boundary is being set. Facilitators will guide patients in utilizing problem-solving interventions to address and correct types boundaries being used and to address when no boundary is being used. Understanding and applying boundaries will be explored and addressed for obtaining and maintaining a balanced life. Patients will be encouraged to explore ways to assertively make their boundaries and needs known to significant others in their lives, using other group members and facilitator for role play, support, and feedback.  Therapeutic Goals:  1.  Patient will identify areas in their life where setting clear boundaries could be  used to improve their life.  2.  Patient will identify signs/triggers that a boundary is not being respected. 3.  Patient will identify two ways to set boundaries in order to achieve balance in  their lives: 4.  Patient will demonstrate ability to communicate their needs and set boundaries  through discussion and/or role plays  Summary of Patient Progress:  Group was not held due to limited staff available to conduct group. Group will resume at earliest date possible.   Therapeutic Modalities:   Cognitive Behavioral Therapy Solution-Focused Therapy  Larose Kells 01/02/2022  10:33 PM

## 2022-01-02 NOTE — Progress Notes (Signed)
[facility] MD Progress Note  01/02/2022 10:13 AM Autumn [name]  MRN:  [id number]  Principal Problem: Bipolar affective disorder, current episode manic with psychotic symptoms ([place]) Diagnosis: Principal Problem:   Bipolar affective disorder, current episode manic with psychotic symptoms ([place])  Patient is a  46y.o. female who presents to the [facility] unit due to exacerbation of bipolar disorder.  Interval History Patient was seen today for re-evaluation.  Nursing reports no events overnight. The patient has no issues with performing ADLs.  Patient has been medication compliant.    Subjective:  On assessment patient reports "I do not feel good today" and explains" I feel very anxious; I did not take my Atarax at nine". Patient appeared relieved after she was told that she can still take her Atarax at any time. Patient reported that she is feeling "better overall, calmer". She denies current suicidal/homicidal ideations. She denies auditory/visual hallucinations. The patient reports no side effects from medications.    Labs: no new results for review.      Total Time spent with patient: 20 minutes  Past Psychiatric History: see H&P   Past Medical History:  Past Medical History:  Diagnosis Date   Allergy    Seasonal, Ultram (seizures)   Anemia 2005   Bipolar 1 disorder ([place])    Glaucoma    Seizures ([place]) 2008    Past Surgical History:  Procedure Laterality Date   BREAST EXCISIONAL BIOPSY Right    x 2   CESAREAN SECTION     x 2   LEEP  2000   Family History:  Family History  Problem Relation Age of Onset   Hypertension Mother    Hyperlipidemia Mother    Asthma Mother    Parkinson's disease Father    Lung cancer Maternal Grandmother    Other Paternal Grandfather 80       Cardiac arrest   Heart disease Paternal Grandfather    Tourette syndrome Daughter    Family Psychiatric  History: see H&P  Social History:  Social History   Substance and Sexual Activity  Alcohol Use Yes    Alcohol/week: 4.0 standard drinks   Types: 4 Cans of beer per week   Comment: Ocassional     Social History   Substance and Sexual Activity  Drug Use Yes   Types: Marijuana   Comment: percocet -last use over 1 week ago. Heroin-last used 2/11    Social History   Socioeconomic History   Marital status: Legally Separated    Spouse name: Not on file   Number of children: Not on file   Years of education: Not on file   Highest education level: Not on file  Occupational History   Not on file  Tobacco Use   Smoking status: Every Day    Packs/day: 1.00    Years: 9.00    Pack years: 9.00    Types: E-cigarettes, Cigarettes   Smokeless tobacco: Never  Vaping Use   Vaping Use: Never used  Substance and Sexual Activity   Alcohol use: Yes    Alcohol/week: 4.0 standard drinks    Types: 4 Cans of beer per week    Comment: Ocassional   Drug use: Yes    Types: Marijuana    Comment: percocet -last use over 1 week ago. Heroin-last used 2/11   Sexual activity: Yes    Birth control/protection: None  Other Topics Concern   Not on file  Social History Narrative   Not on file   Social Determinants  of Health   Financial Resource Strain: Not on file  Food Insecurity: Not on file  Transportation Needs: Not on file  Physical Activity: Not on file  Stress: Not on file  Social Connections: Not on file   Additional Social History:                         Sleep: Fair  Appetite:  Fair  Current Medications: Current Facility-Administered Medications  Medication Dose Route Frequency Provider Last Rate Last Admin   acetaminophen (TYLENOL) tablet 650 mg  650 mg Oral Q6H PRN Clapacs, [name], MD   650 mg at 01/02/22 0823   alum & mag hydroxide-simeth (MAALOX/MYLANTA) 200-200-20 MG/5ML suspension 30 mL  30 mL Oral Q4H PRN Clapacs, [name], MD       ARIPiprazole (ABILIFY) tablet 30 mg  30 mg Oral Daily Clapacs, [name], MD   30 mg at 01/02/22 7209   benztropine (COGENTIN) tablet 1  mg  1 mg Oral Daily Clapacs, [name], MD   1 mg at 01/02/22 0824   divalproex (DEPAKOTE) DR tablet 750 mg  750 mg Oral Q12H Clapacs, [name], MD   750 mg at 01/02/22 0824   feeding supplement (ENSURE ENLIVE / ENSURE PLUS) liquid 237 mL  237 mL Oral BID BM Clapacs, [name], MD   237 mL at 01/02/22 0914   hydrOXYzine (ATARAX) tablet 50 mg  50 mg Oral Q6H PRN Clapacs, [name], MD   50 mg at 01/02/22 0911   lithium carbonate (LITHOBID) CR tablet 600 mg  600 mg Oral Q12H Clapacs, [name], MD   600 mg at 01/02/22 4709   magnesium hydroxide (MILK OF MAGNESIA) suspension 30 mL  30 mL Oral Daily PRN Clapacs, [name], MD       nicotine (NICODERM CQ - dosed in mg/24 hours) patch 21 mg  21 mg Transdermal Q0600 Clapacs, [name], MD   21 mg at 01/02/22 0603   QUEtiapine (SEROQUEL) tablet 100 mg  100 mg Oral QHS Clapacs, [name], MD   100 mg at 01/01/22 2109    Lab Results: No results found for this or any previous visit (from the past 43 hour(s)).  Blood Alcohol level:  Lab Results  Component Value Date   ETH <10 12/24/2021   ETH <10 62/83/6629    Metabolic Disorder Labs: Lab Results  Component Value Date   HGBA1C 4.9 12/25/2021   MPG 93.93 12/25/2021   MPG 114 06/02/2021   No results found for: PROLACTIN Lab Results  Component Value Date   CHOL 160 12/25/2021   TRIG 95 12/25/2021   HDL 58 12/25/2021   CHOLHDL 2.8 12/25/2021   VLDL 19 12/25/2021   LDLCALC 83 12/25/2021   LDLCALC 63 06/01/2021    Physical Findings: AIMS:  , ,  ,  ,    CIWA:    COWS:     Musculoskeletal: Strength & Muscle Tone: within normal limits Gait & Station: normal Patient leans: N/A  Psychiatric Specialty Exam:  Presentation  General Appearance: Bizarre; Disheveled  Eye Contact:Good  Speech: wnl Speech Volume:Decreased  Handedness:Right   Mood and Affect  Mood: anxiuous Affect:Inappropriate; Full Range   Thought Process  Thought Processes: organized somewhat Descriptions of  Associations:Tangential  Orientation:Partial  Thought Content: preoccupations History of Schizophrenia/Schizoaffective disorder:Yes  Duration of Psychotic Symptoms:Greater than six months  Hallucinations:No data recorded Ideas of Reference:Delusions  Suicidal Thoughts:No data recorded Homicidal Thoughts:No data recorded  Sensorium  Memory:Immediate Poor; Recent Poor; Remote Poor  Judgment:Poor  Insight:Poor   Executive Functions  Concentration:Poor  Attention Span:Poor  Recall:Poor  Fund of Knowledge:Poor  Language:Poor   Psychomotor Activity  Psychomotor Activity:No data recorded  Assets  Assets:Communication Skills; Desire for Improvement; Resilience; Social Support   Sleep  Sleep:No data recorded   Physical Exam: Physical Exam ROS Blood pressure 111/85, pulse 88, temperature 98.4 F (36.9 C), temperature source Oral, resp. rate 18, height 5\' 3"  (1.6 m), weight 44 kg, SpO2 99 %. Body mass index is 17.18 kg/m.   Treatment Plan Summary: Daily contact with patient to assess and evaluate symptoms and progress in treatment and Medication management  Patient is a 46 year old female with the above-stated past psychiatric history who is seen in follow-up.  Chart reviewed. Patient discussed with nursing. Patient appears calmer. Compliant with medicines. No changes in medicines made today (doses of Lithium and Depakote were increased two days ago).    Plan:  -continue inpatient psych admission; 15-minute checks; daily contact with patient to assess and evaluate symptoms and progress in treatment; psychoeducation.  -continue scheduled medications:  ARIPiprazole  30 mg Oral Daily   benztropine  1 mg Oral Daily   divalproex  750 mg Oral Q12H   feeding supplement  237 mL Oral BID BM   lithium carbonate  600 mg Oral Q12H   nicotine  21 mg Transdermal Q0600   QUEtiapine  100 mg Oral QHS    -continue PRN medications.  acetaminophen, alum & mag  hydroxide-simeth, hydrOXYzine, magnesium hydroxide  -Pertinent Labs: no new labs ordered today   -Disposition: Patient is showing gradual improvement, although is not ready for discharge yet.  -  I certify that the patient does need, on a daily basis, active treatment furnished directly by or requiring the supervision of inpatient psychiatric facility personnel.     Larita Fife, MD 01/02/2022, 10:13 AM

## 2022-01-03 DIAGNOSIS — F312 Bipolar disorder, current episode manic severe with psychotic features: Secondary | ICD-10-CM | POA: Diagnosis not present

## 2022-01-03 MED ORDER — QUETIAPINE FUMARATE 25 MG PO TABS
150.0000 mg | ORAL_TABLET | Freq: Every day | ORAL | Status: DC
  Administered 2022-01-03 – 2022-01-07 (×5): 150 mg via ORAL
  Filled 2022-01-03 (×5): qty 2
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
## 2022-01-03 NOTE — Progress Notes (Signed)
Patient rates pain 5/10. Patient rates anxiety and depression 5/10. Patient denies SI/HI/AVH. Patient states she does talk to her self when she is alone.  Patient rates anxiety 7/10. PRN anxiety medication provided to patient. Patient rates anxiety 5/10 when reassessed.  Patient compliant with medication administration. Q15 minute safety checks maintained. Patient remains safe on the unit at this time.

## 2022-01-03 NOTE — Group Note (Signed)
LCSW Group Therapy Note  Group Date: 01/03/2022 Start Time: 1300 End Time: 1400   Type of Therapy and Topic:  Group Therapy - Healthy vs Unhealthy Coping Skills  Participation Level:  Active   Description of Group The focus of this group was to determine what unhealthy coping techniques typically are used by group members and what healthy coping techniques would be helpful in coping with various problems. Patients were guided in becoming aware of the differences between healthy and unhealthy coping techniques. Patients were asked to identify 2-3 healthy coping skills they would like to learn to use more effectively.  Therapeutic Goals Patients learned that coping is what human beings do all day long to deal with various situations in their lives Patients defined and discussed healthy vs unhealthy coping techniques Patients identified their preferred coping techniques and identified whether these were healthy or unhealthy Patients determined 2-3 healthy coping skills they would like to become more familiar with and use more often. Patients provided support and ideas to each other   Summary of Patient Progress:  Patient was present for the entirety of the group session. Patient was an active listener and participated in the topic of discussion. Patient identified substance use as an unhealthy coping skill she relies on. Patient expressed a desire to remain abstinent from drugs after she is discharged from the hospital. However, patient did not express interest in participating in substance use treatment at this time. Patient was receptive to the idea of attending 12-step meetings. Patient identified deep breathing and taking medication as prescribed as healthy coping skills.   Therapeutic Modalities Cognitive Behavioral Therapy Motivational Interviewing  Berniece Salines, Latanya Presser 01/03/2022  2:13 PM

## 2022-01-03 NOTE — Plan of Care (Signed)
°  Problem: Education: Goal: Knowledge of [facility] Education information/materials will improve Outcome: Not Progressing Goal: Emotional status will improve Outcome: Not Progressing Goal: Mental status will improve Outcome: Not Progressing Goal: Verbalization of understanding the information provided will improve Outcome: Not Progressing   Problem: Activity: Goal: Interest or engagement in activities will improve Outcome: Not Progressing Goal: Sleeping patterns will improve Outcome: Not Progressing   Problem: Coping: Goal: Ability to verbalize frustrations and anger appropriately will improve Outcome: Not Progressing Goal: Ability to demonstrate self-control will improve Outcome: Not Progressing   Problem: Health Behavior/Discharge Planning: Goal: Identification of resources available to assist in meeting health care needs will improve Outcome: Not Progressing Goal: Compliance with treatment plan for underlying cause of condition will improve Outcome: Not Progressing   Problem: Physical Regulation: Goal: Ability to maintain clinical measurements within normal limits will improve Outcome: Not Progressing   Problem: Safety: Goal: Periods of time without injury will increase Outcome: Not Progressing   Problem: Education: Goal: Knowledge of disease or condition will improve Outcome: Not Progressing Goal: Understanding of discharge needs will improve Outcome: Not Progressing   Problem: Health Behavior/Discharge Planning: Goal: Ability to identify changes in lifestyle to reduce recurrence of condition will improve Outcome: Not Progressing Goal: Identification of resources available to assist in meeting health care needs will improve Outcome: Not Progressing   Problem: Physical Regulation: Goal: Complications related to the disease process, condition or treatment will be avoided or minimized Outcome: Not Progressing   Problem: Safety: Goal: Ability to remain  free from injury will improve Outcome: Not Progressing

## 2022-01-03 NOTE — Plan of Care (Signed)
°  Problem: Education: Goal: Knowledge of [facility] Education information/materials will improve Outcome: Progressing Goal: Mental status will improve Outcome: Not Progressing   Problem: Health Behavior/Discharge Planning: Goal: Compliance with treatment plan for underlying cause of condition will improve Outcome: Progressing   Problem: Physical Regulation: Goal: Ability to maintain clinical measurements within normal limits will improve Outcome: Progressing   Problem: Safety: Goal: Periods of time without injury will increase Outcome: Progressing   Problem: Safety: Goal: Ability to remain free from injury will improve Outcome: Progressing

## 2022-01-04 DIAGNOSIS — F312 Bipolar disorder, current episode manic severe with psychotic features: Secondary | ICD-10-CM | POA: Diagnosis not present

## 2022-01-04 NOTE — Progress Notes (Signed)
[facility] MD Progress Note  01/04/2022 4:21 PM Autumn [name]  MRN:  [id number] Subjective: Follow-up 46 year old woman with bipolar disorder.  Most of the time during the day she is behaving herself okay but still having episodes of such behavior as stripping her clothes off at night.  Patient is a bit disorganized in her thinking.  She seems calm but then will say things that make no sense.  She has insight into it and recognizes that she is not thinking clearly.  No complaints of any side effects from medicine Principal Problem: Bipolar affective disorder, current episode manic with psychotic symptoms (Autumn [name]) Diagnosis: Principal Problem:   Bipolar affective disorder, current episode manic with psychotic symptoms (Autumn [name])  Total Time spent with patient: 30 minutes  Past Psychiatric History: Past history of bipolar disorder and substance abuse  Past Medical History:  Past Medical History:  Diagnosis Date   Allergy    Seasonal, Ultram (seizures)   Anemia 2005   Bipolar 1 disorder (Autumn [name])    Glaucoma    Seizures (Autumn [name]) 2008    Past Surgical History:  Procedure Laterality Date   BREAST EXCISIONAL BIOPSY Right    x 2   CESAREAN SECTION     x 2   LEEP  2000   Family History:  Family History  Problem Relation Age of Onset   Hypertension Mother    Hyperlipidemia Mother    Asthma Mother    Parkinson's disease Father    Lung cancer Maternal Grandmother    Other Paternal Grandfather 62       Cardiac arrest   Heart disease Paternal Grandfather    Tourette syndrome Daughter    Family Psychiatric  History: See previous Social History:  Social History   Substance and Sexual Activity  Alcohol Use Yes   Alcohol/week: 4.0 standard drinks   Types: 4 Cans of beer per week   Comment: Ocassional     Social History   Substance and Sexual Activity  Drug Use Yes   Types: Marijuana   Comment: percocet -last use over 1 week ago. Heroin-last used 2/11    Social History   Socioeconomic History    Marital status: Legally Separated    Spouse name: Not on file   Number of children: Not on file   Years of education: Not on file   Highest education level: Not on file  Occupational History   Not on file  Tobacco Use   Smoking status: Every Day    Packs/day: 1.00    Years: 9.00    Pack years: 9.00    Types: E-cigarettes, Cigarettes   Smokeless tobacco: Never  Vaping Use   Vaping Use: Never used  Substance and Sexual Activity   Alcohol use: Yes    Alcohol/week: 4.0 standard drinks    Types: 4 Cans of beer per week    Comment: Ocassional   Drug use: Yes    Types: Marijuana    Comment: percocet -last use over 1 week ago. Heroin-last used 2/11   Sexual activity: Yes    Birth control/protection: None  Other Topics Concern   Not on file  Social History Narrative   Not on file   Social Determinants of Health   Financial Resource Strain: Not on file  Food Insecurity: Not on file  Transportation Needs: Not on file  Physical Activity: Not on file  Stress: Not on file  Social Connections: Not on file   Additional Social History:  Sleep: Poor  Appetite:  Fair  Current Medications: Current Facility-Administered Medications  Medication Dose Route Frequency Provider Last Rate Last Admin   acetaminophen (TYLENOL) tablet 650 mg  650 mg Oral Q6H PRN Sherhonda Gaspar, [name], MD   650 mg at 01/04/22 1528   alum & mag hydroxide-simeth (MAALOX/MYLANTA) 200-200-20 MG/5ML suspension 30 mL  30 mL Oral Q4H PRN Julianah Marciel, [name], MD       ARIPiprazole (ABILIFY) tablet 30 mg  30 mg Oral Daily Malaia Buchta T, MD   30 mg at 01/04/22 5643   benztropine (COGENTIN) tablet 1 mg  1 mg Oral Daily Bayan Kushnir T, MD   1 mg at 01/04/22 3295   diphenhydrAMINE (BENADRYL) capsule 50 mg  50 mg Oral Q6H PRN Deloria Lair, NP       Or   diphenhydrAMINE (BENADRYL) injection 50 mg  50 mg Intramuscular Q6H PRN Dixon, Rashaun M, NP   50 mg at 01/03/22 0000   divalproex  (DEPAKOTE) DR tablet 750 mg  750 mg Oral Q12H Genia Perin T, MD   750 mg at 01/04/22 1884   feeding supplement (ENSURE ENLIVE / ENSURE PLUS) liquid 237 mL  237 mL Oral BID Autumn [name] T, MD   237 mL at 01/04/22 1422   hydrOXYzine (ATARAX) tablet 50 mg  50 mg Oral Q6H PRN Regenia Erck T, MD   50 mg at 01/04/22 0825   lithium carbonate (LITHOBID) CR tablet 600 mg  600 mg Oral Q12H Quaid Yeakle T, MD   600 mg at 01/04/22 1660   LORazepam (ATIVAN) tablet 2 mg  2 mg Oral Q4H PRN Deloria Lair, NP   2 mg at 01/03/22 6301   Or   LORazepam (ATIVAN) injection 2 mg  2 mg Intramuscular Q4H PRN Deloria Lair, NP   2 mg at 01/03/22 0000   magnesium hydroxide (MILK OF MAGNESIA) suspension 30 mL  30 mL Oral Daily PRN Autumn [name] T, MD       nicotine (NICODERM CQ - dosed in mg/24 hours) patch 21 mg  21 mg Transdermal Q0600 Benigna Delisi T, MD   21 mg at 01/04/22 0641   QUEtiapine (SEROQUEL) tablet 150 mg  150 mg Oral QHS Larita Fife, MD   150 mg at 01/03/22 2159    Lab Results: No results found for this or any previous visit (from the past 48 hour(s)).  Blood Alcohol level:  Lab Results  Component Value Date   ETH <10 12/24/2021   ETH <10 60/08/9322    Metabolic Disorder Labs: Lab Results  Component Value Date   HGBA1C 4.9 12/25/2021   MPG 93.93 12/25/2021   MPG 114 06/02/2021   No results found for: PROLACTIN Lab Results  Component Value Date   CHOL 160 12/25/2021   TRIG 95 12/25/2021   HDL 58 12/25/2021   CHOLHDL 2.8 12/25/2021   VLDL 19 12/25/2021   LDLCALC 83 12/25/2021   LDLCALC 63 06/01/2021    Physical Findings: AIMS:  , ,  ,  ,    CIWA:    COWS:     Musculoskeletal: Strength & Muscle Tone: within normal limits Gait & Station: normal Patient leans: N/A  Psychiatric Specialty Exam:  Presentation  General Appearance: Bizarre; Disheveled  Eye Contact:Good  Speech:Pressured; Slurred  Speech Volume:Decreased  Handedness:Right   Mood and Affect   Mood:Euphoric  Affect:Inappropriate; Full Range   Thought Process  Thought Processes:Disorganized  Descriptions of Associations:Tangential  Orientation:Partial  Thought Content:Obsessions;  Paranoid Ideation; Illogical  History of Schizophrenia/Schizoaffective disorder:Yes  Duration of Psychotic Symptoms:Greater than six months  Hallucinations:No data recorded Ideas of Reference:Delusions  Suicidal Thoughts:No data recorded Homicidal Thoughts:No data recorded  Sensorium  Memory:Immediate Poor; Recent Poor; Remote Poor  Judgment:Poor  Insight:Poor   Executive Functions  Concentration:Poor  Attention Span:Poor  Recall:Poor  Fund of Knowledge:Poor  Language:Poor   Psychomotor Activity  Psychomotor Activity:No data recorded  Assets  Assets:Communication Skills; Desire for Improvement; Resilience; Social Support   Sleep  Sleep:No data recorded   Physical Exam: Physical Exam Vitals and nursing note reviewed.  Constitutional:      Appearance: Normal appearance.  HENT:     Head: Normocephalic and atraumatic.     Mouth/Throat:     Pharynx: Oropharynx is clear.  Eyes:     Pupils: Pupils are equal, round, and reactive to light.  Cardiovascular:     Rate and Rhythm: Normal rate and regular rhythm.  Pulmonary:     Effort: Pulmonary effort is normal.     Breath sounds: Normal breath sounds.  Abdominal:     General: Abdomen is flat.     Palpations: Abdomen is soft.  Musculoskeletal:        General: Normal range of motion.  Skin:    General: Skin is warm and dry.  Neurological:     General: No focal deficit present.     Mental Status: She is alert. Mental status is at baseline.  Psychiatric:        Attention and Perception: She is inattentive.        Mood and Affect: Mood normal. Affect is blunt.        Speech: Speech is delayed.        Behavior: Behavior is slowed.        Thought Content: Thought content normal.        Cognition and Memory:  Cognition is impaired. Memory is impaired.   Review of Systems  Constitutional: Negative.   HENT: Negative.    Eyes: Negative.   Respiratory: Negative.    Cardiovascular: Negative.   Gastrointestinal: Negative.   Musculoskeletal: Negative.   Skin: Negative.   Neurological: Negative.   Psychiatric/Behavioral:  Negative for depression, hallucinations, substance abuse and suicidal ideas. The patient is nervous/anxious and has insomnia.   Blood pressure 93/68, pulse 94, temperature 98.6 F (37 C), temperature source Oral, resp. rate 18, height 5\' 3"  (1.6 m), weight 44 kg, SpO2 98 %. Body mass index is 17.18 kg/m.   Treatment Plan Summary: Medication management and Plan check levels of Depakote and lithium again tomorrow.  Reviewed medicines with patient.  Her current medicines have been very effective for her in the past we agreed to give them another day or so to see how her behavior is improving.  Alethia Berthold, MD 01/04/2022, 4:21 PM

## 2022-01-04 NOTE — Progress Notes (Signed)
Recreation Therapy Notes    Date: 01/04/2022  Time: 10:15 am    Location: Craft room    Behavioral response: Appropriate  Intervention Topic:  Time Management    Discussion/Intervention:  Group content today was focused on time management. The group defined time management and identified healthy ways to manage time. Individuals expressed how much of the 24 hours they use in a day. Patients expressed how much time they use just for themselves personally. The group expressed how they have managed their time in the past. Individuals participated in the intervention Managing Life where they had a chance to see how much of the 24 hours they use and where it goes.  Clinical Observations/Feedback: Patient came to group and was focused on what peers and staff had to say about time management. She defined time management  as finishing a task. Participant explained that she normally manages her time by writing in her book. Individual was social with peers and staff while participating in the intervention.   Derion Kreiter LRT/CTRS         Saniyah Mondesir 01/04/2022 11:13 AM

## 2022-01-04 NOTE — Group Note (Signed)
LCSW Group Therapy Note  Group Date: 01/04/2022 Start Time: 1300 End Time: 1400   Type of Therapy and Topic:  Group Therapy - How To Cope with Nervousness about Discharge   Participation Level:  Active   Description of Group This process group involved identification of patients' feelings about discharge. Some of them are scheduled to be discharged soon, while others are new admissions, but each of them was asked to share thoughts and feelings surrounding discharge from the hospital. One common theme was that they are excited at the prospect of going home, while another was that many of them are apprehensive about sharing why they were hospitalized. Patients were given the opportunity to discuss these feelings with their peers in preparation for discharge.  Therapeutic Goals  Patient will identify their overall feelings about pending discharge. Patient will think about how they might proactively address issues that they believe will once again arise once they get home (i.e. with parents). Patients will participate in discussion about having hope for change.   Summary of Patient Progress:  Autumn [name] was very active throughout the session. Patient demonstrated poor insight into the subject matter, and proved open to input from peers and feedback from Autumn [name]. She was respectful of peers and participated throughout the entire session. However, she was highly tangential and reckless about speech.    Therapeutic Modalities Cognitive Behavioral Therapy   Autumn [name] 01/04/2022  3:36 PM

## 2022-01-04 NOTE — Progress Notes (Signed)
Patient presents with a brighter affect than when this writer had her previously this admission. Pt denies SI/HI/AVH Pt compliant with medication administration per MD orders. Pt given education, support, and encouragement to be active in her treatment plan. Pt being monitored Q 15 minutes for safety per unit protocol. Pt remains safe on the unit.

## 2022-01-04 NOTE — Plan of Care (Signed)
D: Pt alert and oriented. Pt rates depression 3/10, hopelessness 0/10, and anxiety 6/10. Pt goal: "Going to group." Pt reports energy level as normal and concentration as being good. Pt denies experiencing any pain at this time. Pt denies experiencing any SI/HI, or AVH at this time.   Pt has been extremely apologetic for previous behavior toward this Probation officer. Pt has been calm and cooperative. Pt has also attended group today.   A: Scheduled medications administered to pt, per MD orders. Support and encouragement provided. Frequent verbal contact made. Routine safety checks conducted q15 minutes.   R: No adverse drug reactions noted. Pt verbally contracts for safety at this time. Pt compliant with medications and treatment plan. Pt interacts well with others on the unit. Pt remains safe at this time. Will continue to monitor.   Problem: Education: Goal: Emotional status will improve Outcome: Not Progressing Goal: Mental status will improve Outcome: Not Progressing

## 2022-01-04 NOTE — Progress Notes (Signed)
Patient alert and oriented x 2 with periods of confusion to person and situation, affect is blunted thoughts are disorganized, her speech is tangential minimal interaction with peers and staff, she appears responding to internal stimuli. Patient denies SI/HI/AVH, she is complaint with medication regimen. 15 minutes safety checks maintained, will continue to monitor.

## 2022-01-05 DIAGNOSIS — F312 Bipolar disorder, current episode manic severe with psychotic features: Secondary | ICD-10-CM | POA: Diagnosis not present

## 2022-01-05 LAB — LITHIUM LEVEL: Lithium Lvl: 0.58 mmol/L — ABNORMAL LOW (ref 0.60–1.20)

## 2022-01-05 LAB — VALPROIC ACID LEVEL: Valproic Acid Lvl: 68 ug/mL (ref 50.0–100.0)

## 2022-01-05 NOTE — Progress Notes (Signed)
Recreation Therapy Notes  Date: 01/05/2022  Time: 10:10 am     Location: Craft room    Behavioral response: Appropriate  Intervention Topic:  Stress Management    Discussion/Intervention:  Group content on today was focused on stress. The group defined stress and way to cope with stress. Participants expressed how they know when they are stresses out. Individuals described the different ways they have to cope with stress. The group stated reasons why it is important to cope with stress. Patient explained what good stress is and some examples. The group participated in the intervention Stress Management. Individuals were separated into two group and answered questions related to stress.   Clinical Observations/Feedback: Patient came to group and defined stress as things that get you upset. She stated that she manages her stress by walking and behaving. Individual was social with peers and staff while participating in the intervention.   Zyrell Carmean LRT/CTRS         Arvie Bartholomew 01/05/2022 11:47 AM

## 2022-01-05 NOTE — Progress Notes (Signed)
[facility] MD Progress Note  01/05/2022 1:03 PM Autumn [name]  MRN:  [id number] Subjective: Follow-up for this patient with bipolar disorder and substance abuse.  Lithium level and Depakote level were done this morning and remarkably are almost exactly the same as they were last time even though both medicines had their doses increased by 50%.  The patient's behavior is improving.  She is gone for about a day without doing anything bizarre or destructive.  Still looks distracted and confused much of the time.  Able to hold a conversation without getting bizarre.  Denies any thoughts of hurting self or others Principal Problem: Bipolar affective disorder, current episode manic with psychotic symptoms ([place]) Diagnosis: Principal Problem:   Bipolar affective disorder, current episode manic with psychotic symptoms ([place])  Total Time spent with patient: 30 minutes  Past Psychiatric History: Past history of longstanding bipolar disorder and substance abuse  Past Medical History:  Past Medical History:  Diagnosis Date   Allergy    Seasonal, Ultram (seizures)   Anemia 2005   Bipolar 1 disorder ([place])    Glaucoma    Seizures ([place]) 2008    Past Surgical History:  Procedure Laterality Date   BREAST EXCISIONAL BIOPSY Right    x 2   CESAREAN SECTION     x 2   LEEP  2000   Family History:  Family History  Problem Relation Age of Onset   Hypertension Mother    Hyperlipidemia Mother    Asthma Mother    Parkinson's disease Father    Lung cancer Maternal Grandmother    Other Paternal Grandfather 36       Cardiac arrest   Heart disease Paternal Grandfather    Tourette syndrome Daughter    Family Psychiatric  History: See previous Social History:  Social History   Substance and Sexual Activity  Alcohol Use Yes   Alcohol/week: 4.0 standard drinks   Types: 4 Cans of beer per week   Comment: Ocassional     Social History   Substance and Sexual Activity  Drug Use Yes   Types: Marijuana    Comment: percocet -last use over 1 week ago. Heroin-last used 2/11    Social History   Socioeconomic History   Marital status: Legally Separated    Spouse name: Not on file   Number of children: Not on file   Years of education: Not on file   Highest education level: Not on file  Occupational History   Not on file  Tobacco Use   Smoking status: Every Day    Packs/day: 1.00    Years: 9.00    Pack years: 9.00    Types: E-cigarettes, Cigarettes   Smokeless tobacco: Never  Vaping Use   Vaping Use: Never used  Substance and Sexual Activity   Alcohol use: Yes    Alcohol/week: 4.0 standard drinks    Types: 4 Cans of beer per week    Comment: Ocassional   Drug use: Yes    Types: Marijuana    Comment: percocet -last use over 1 week ago. Heroin-last used 2/11   Sexual activity: Yes    Birth control/protection: None  Other Topics Concern   Not on file  Social History Narrative   Not on file   Social Determinants of Health   Financial Resource Strain: Not on file  Food Insecurity: Not on file  Transportation Needs: Not on file  Physical Activity: Not on file  Stress: Not on file  Social Connections: Not on  file   Additional Social History:                         Sleep: Fair  Appetite:  Fair  Current Medications: Current Facility-Administered Medications  Medication Dose Route Frequency Provider Last Rate Last Admin   acetaminophen (TYLENOL) tablet 650 mg  650 mg Oral Q6H PRN Nijel Flink, [name], MD   650 mg at 01/05/22 0810   alum & mag hydroxide-simeth (MAALOX/MYLANTA) 200-200-20 MG/5ML suspension 30 mL  30 mL Oral Q4H PRN Gaither Biehn, [name], MD       ARIPiprazole (ABILIFY) tablet 30 mg  30 mg Oral Daily Caroly Purewal, [name], MD   30 mg at 01/05/22 0811   benztropine (COGENTIN) tablet 1 mg  1 mg Oral Daily Trenna Kiely, [name], MD   1 mg at 01/05/22 2094   diphenhydrAMINE (BENADRYL) capsule 50 mg  50 mg Oral Q6H PRN Deloria Lair, NP       Or   diphenhydrAMINE  (BENADRYL) injection 50 mg  50 mg Intramuscular Q6H PRN Deloria Lair, NP   50 mg at 01/03/22 0000   divalproex (DEPAKOTE) DR tablet 750 mg  750 mg Oral Q12H Tymothy [name] T, MD   750 mg at 01/05/22 0810   feeding supplement (ENSURE ENLIVE / ENSURE PLUS) liquid 237 mL  237 mL Oral BID BM Tiernan Millikin T, MD   237 mL at 01/05/22 1005   hydrOXYzine (ATARAX) tablet 50 mg  50 mg Oral Q6H PRN Memori Sammon T, MD   50 mg at 01/05/22 0809   lithium carbonate (LITHOBID) CR tablet 600 mg  600 mg Oral Q12H Demarius Archila T, MD   600 mg at 01/05/22 7096   LORazepam (ATIVAN) tablet 2 mg  2 mg Oral Q4H PRN Deloria Lair, NP   2 mg at 01/03/22 2836   Or   LORazepam (ATIVAN) injection 2 mg  2 mg Intramuscular Q4H PRN Deloria Lair, NP   2 mg at 01/03/22 0000   magnesium hydroxide (MILK OF MAGNESIA) suspension 30 mL  30 mL Oral Daily PRN Simren Popson, [name], MD       nicotine (NICODERM CQ - dosed in mg/24 hours) patch 21 mg  21 mg Transdermal Q0600 Sanyia Dini T, MD   21 mg at 01/05/22 6294   QUEtiapine (SEROQUEL) tablet 150 mg  150 mg Oral QHS Larita Fife, MD   150 mg at 01/04/22 2104    Lab Results:  Results for orders placed or performed during the hospital encounter of 12/25/21 (from the past 48 hour(s))  Lithium level     Status: Abnormal   Collection Time: 01/05/22  8:46 AM  Result Value Ref Range   Lithium Lvl 0.58 (L) 0.60 - 1.20 mmol/L    Comment: Performed at [facility], [address] [place], [place] [address]  Valproic acid level     Status: None   Collection Time: 01/05/22  8:46 AM  Result Value Ref Range   Valproic Acid Lvl 68 50.0 - 100.0 ug/mL    Comment: Performed at [facility], [address] [place], [place] [address]    Blood Alcohol level:  Lab Results  Component Value Date   [facility] <10 12/24/2021   ETH <10 65/68/1275    Metabolic Disorder Labs: Lab Results  Component Value Date   HGBA1C 4.9 12/25/2021   MPG 93.93 12/25/2021   MPG 114  06/02/2021   No results  found for: PROLACTIN Lab Results  Component Value Date   CHOL 160 12/25/2021   TRIG 95 12/25/2021   HDL 58 12/25/2021   CHOLHDL 2.8 12/25/2021   VLDL 19 12/25/2021   LDLCALC 83 12/25/2021   LDLCALC 63 06/01/2021    Physical Findings: AIMS:  , ,  ,  ,    CIWA:    COWS:     Musculoskeletal: Strength & Muscle Tone: within normal limits Gait & Station: normal Patient leans: N/A  Psychiatric Specialty Exam:  Presentation  General Appearance: Bizarre; Disheveled  Eye Contact:Good  Speech:Pressured; Slurred  Speech Volume:Decreased  Handedness:Right   Mood and Affect  Mood:Euphoric  Affect:Inappropriate; Full Range   Thought Process  Thought Processes:Disorganized  Descriptions of Associations:Tangential  Orientation:Partial  Thought Content:Obsessions; Paranoid Ideation; Illogical  History of Schizophrenia/Schizoaffective disorder:Yes  Duration of Psychotic Symptoms:Greater than six months  Hallucinations:No data recorded Ideas of Reference:Delusions  Suicidal Thoughts:No data recorded Homicidal Thoughts:No data recorded  Sensorium  Memory:Immediate Poor; Recent Poor; Remote Poor  Judgment:Poor  Insight:Poor   Executive Functions  Concentration:Poor  Attention Span:Poor  Recall:Poor  Fund of Knowledge:Poor  Language:Poor   Psychomotor Activity  Psychomotor Activity:No data recorded  Assets  Assets:Communication Skills; Desire for Improvement; Resilience; Social Support   Sleep  Sleep:No data recorded   Physical Exam: Physical Exam Vitals and nursing note reviewed.  Constitutional:      Appearance: Normal appearance.  HENT:     Head: Normocephalic and atraumatic.     Mouth/Throat:     Pharynx: Oropharynx is clear.  Eyes:     Pupils: Pupils are equal, round, and reactive to light.  Cardiovascular:     Rate and Rhythm: Normal rate and regular rhythm.  Pulmonary:     Effort: Pulmonary effort is  normal.     Breath sounds: Normal breath sounds.  Abdominal:     General: Abdomen is flat.     Palpations: Abdomen is soft.  Musculoskeletal:        General: Normal range of motion.  Skin:    General: Skin is warm and dry.  Neurological:     General: No focal deficit present.     Mental Status: She is alert. Mental status is at baseline.  Psychiatric:        Attention and Perception: She is inattentive.        Mood and Affect: Mood normal. Affect is blunt.        Speech: Speech is delayed.        Behavior: Behavior is slowed.        Thought Content: Thought content normal.        Cognition and Memory: Memory is impaired.        Judgment: Judgment is impulsive.   Review of Systems  Constitutional: Negative.   HENT: Negative.    Eyes: Negative.   Respiratory: Negative.    Cardiovascular: Negative.   Gastrointestinal: Negative.   Musculoskeletal: Negative.   Skin: Negative.   Neurological: Negative.   Psychiatric/Behavioral:  Positive for memory loss. Negative for depression, hallucinations, substance abuse and suicidal ideas. The patient is not nervous/anxious.   Blood pressure (!) 99/59, pulse 86, temperature 98.6 F (37 C), temperature source Oral, resp. rate 18, height 5\' 3"  (1.6 m), weight 44 kg, SpO2 98 %. Body mass index is 17.18 kg/m.   Treatment Plan Summary: Medication management and Plan despite the blood levels for the medicines lithium and Depakote still not being very high I am not going  to increase the doses again.  She is up to doses that really ought to be appropriate for her body size and I do not want her to become toxic.  Patient encouraged to continue being fully compliant with medicine attending groups and trying to get a good night sleep at night.  I think we are starting to see some improvement.  Alethia Berthold, MD 01/05/2022, 1:03 PM

## 2022-01-05 NOTE — Progress Notes (Signed)
Patient irritable with staff tonight but was redirectable. Pt later apologized to staff saying that she wasn't in her right head. Pt denies SI/HI/AVH Pt compliant with medication administration per MD orders. Pt given education, support, and encouragement to be active in her treatment plan. Pt being monitored Q 15 minutes for safety per unit protocol. Pt remains safe on the unit.

## 2022-01-05 NOTE — Progress Notes (Signed)
Patient awake and alert this day. Periods of isolating in room mixed with periods of interacting with some peers. Denies SI/HI/AVH. Endorses Pain 6/10 see assessment for details.   Patient c/o of anxiety and depression. Rates both 7/10. Prn medication given for anxiety throughout the shift for anxiety. Patient is delusional and needs continual reality orientation.     Labs and vital signs monitored. Patient eats all meals. Supported emotionally and encouraged to verbalize concerns. No adverse reactions noted.   Cont Q15 minute check for safety.

## 2022-01-05 NOTE — Plan of Care (Signed)
°  Problem: Education: Goal: Knowledge of [facility] Education information/materials will improve Outcome: Progressing Goal: Emotional status will improve Outcome: Progressing Goal: Mental status will improve Outcome: Progressing Goal: Verbalization of understanding the information provided will improve Outcome: Progressing   Problem: Activity: Goal: Interest or engagement in activities will improve Outcome: Progressing Goal: Sleeping patterns will improve Outcome: Progressing   Problem: Coping: Goal: Ability to verbalize frustrations and anger appropriately will improve Outcome: Progressing Goal: Ability to demonstrate self-control will improve Outcome: Progressing   Problem: Health Behavior/Discharge Planning: Goal: Identification of resources available to assist in meeting health care needs will improve Outcome: Progressing Goal: Compliance with treatment plan for underlying cause of condition will improve Outcome: Progressing   Problem: Physical Regulation: Goal: Ability to maintain clinical measurements within normal limits will improve Outcome: Progressing   Problem: Safety: Goal: Periods of time without injury will increase Outcome: Progressing   Problem: Education: Goal: Knowledge of disease or condition will improve Outcome: Progressing Goal: Understanding of discharge needs will improve Outcome: Progressing   Problem: Education: Goal: Understanding of discharge needs will improve Outcome: Progressing   Problem: Health Behavior/Discharge Planning: Goal: Ability to identify changes in lifestyle to reduce recurrence of condition will improve Outcome: Progressing Goal: Identification of resources available to assist in meeting health care needs will improve Outcome: Progressing   Problem: Physical Regulation: Goal: Complications related to the disease process, condition or treatment will be avoided or minimized Outcome: Progressing

## 2022-01-05 NOTE — Plan of Care (Signed)
Patient compliant with procedures on the unit   Problem: Education: Goal: Knowledge of [place] Education information/materials will improve Outcome: Progressing Goal: Emotional status will improve Outcome: Progressing Goal: Mental status will improve Outcome: Progressing Goal: Verbalization of understanding the information provided will improve Outcome: Progressing

## 2022-01-05 NOTE — Group Note (Signed)
[facility] LCSW Group Therapy Note   Group Date: 01/05/2022 Start Time: 1300 End Time: 1355  Type of Therapy/Topic:  Group Therapy:  Feelings about Diagnosis  Participation Level:  Active   Description of Group:    This group will allow patients to explore their thoughts and feelings about diagnoses they have received. Patients will be guided to explore their level of understanding and acceptance of these diagnoses. Facilitator will encourage patients to process their thoughts and feelings about the reactions of others to their diagnosis, and will guide patients in identifying ways to discuss their diagnosis with significant others in their lives. This group will be process-oriented, with patients participating in exploration of their own experiences as well as giving and receiving support and challenge from other group members.   Therapeutic Goals: 1. Patient will demonstrate understanding of diagnosis as evidence by identifying two or more symptoms of the disorder:  2. Patient will be able to express two feelings regarding the diagnosis 3. Patient will demonstrate ability to communicate their needs through discussion and/or role plays  Summary of Patient Progress: Patient was actively involved in the conversation. She stated that knowing her mental health diagnosis has given her a starting point for treatment. She shared that for awhile she was doing well but her partner died and she stopped taking her medication. At times pt would make comments that had nothing to do with the conversation at hand but was easily redirectable. Pt was present for the entirety of the group discussion.   Therapeutic Modalities:   Cognitive Behavioral Therapy Brief Therapy Feelings Identification    Shirl Harris, LCSW

## 2022-01-06 ENCOUNTER — Other Ambulatory Visit: Payer: Self-pay

## 2022-01-06 DIAGNOSIS — F312 Bipolar disorder, current episode manic severe with psychotic features: Secondary | ICD-10-CM | POA: Diagnosis not present

## 2022-01-06 MED ORDER — NICOTINE 21 MG/24HR TD PT24
21.0000 mg | MEDICATED_PATCH | Freq: Every day | TRANSDERMAL | 0 refills | Status: DC
  Filled 2022-01-06: qty 14, 14d supply, fill #0

## 2022-01-06 MED ORDER — HYDROXYZINE HCL 50 MG PO TABS
50.0000 mg | ORAL_TABLET | Freq: Four times a day (QID) | ORAL | 0 refills | Status: DC | PRN
  Filled 2022-01-06: qty 20, 5d supply, fill #0

## 2022-01-06 MED ORDER — DIVALPROEX SODIUM 250 MG PO DR TAB
750.0000 mg | DELAYED_RELEASE_TABLET | Freq: Two times a day (BID) | ORAL | 0 refills | Status: DC
Start: 2022-01-06 — End: 2022-01-08
  Filled 2022-01-06: qty 60, 10d supply, fill #0

## 2022-01-06 MED ORDER — QUETIAPINE FUMARATE 50 MG PO TABS
150.0000 mg | ORAL_TABLET | Freq: Every day | ORAL | 0 refills | Status: DC
  Filled 2022-01-06: qty 30, 10d supply, fill #0

## 2022-01-06 MED ORDER — ARIPIPRAZOLE 15 MG PO TABS
30.0000 mg | ORAL_TABLET | Freq: Every day | ORAL | 0 refills | Status: DC
  Filled 2022-01-06: qty 20, 10d supply, fill #0

## 2022-01-06 MED ORDER — LITHIUM CARBONATE ER 300 MG PO TBCR
600.0000 mg | EXTENDED_RELEASE_TABLET | Freq: Two times a day (BID) | ORAL | 0 refills | Status: DC
  Filled 2022-01-06: qty 40, 10d supply, fill #0

## 2022-01-06 MED ORDER — BENZTROPINE MESYLATE 0.5 MG PO TABS
1.0000 mg | ORAL_TABLET | Freq: Every day | ORAL | 0 refills | Status: DC
  Filled 2022-01-06: qty 20, 10d supply, fill #0

## 2022-01-06 NOTE — Progress Notes (Signed)
[facility] MD Progress Note  01/06/2022 3:58 PM Autumn [name]  MRN:  [id number] Subjective: Follow-up patient with schizoaffective disorder or bipolar disorder.  Patient is calmer today.  Has not had any overtly psychotic behavior for over a day.  Complains of being tired but does not look fatigued and is up and alert and active throughout the day.  No side effects from medicine evident. Principal Problem: Bipolar affective disorder, current episode manic with psychotic symptoms ([place]) Diagnosis: Principal Problem:   Bipolar affective disorder, current episode manic with psychotic symptoms ([place])  Total Time spent with patient: 30 minutes  Past Psychiatric History: Long history of substance abuse and chronic psychotic disorder  Past Medical History:  Past Medical History:  Diagnosis Date   Allergy    Seasonal, Ultram (seizures)   Anemia 2005   Bipolar 1 disorder ([place])    Glaucoma    Seizures ([place]) 2008    Past Surgical History:  Procedure Laterality Date   BREAST EXCISIONAL BIOPSY Right    x 2   CESAREAN SECTION     x 2   LEEP  2000   Family History:  Family History  Problem Relation Age of Onset   Hypertension Mother    Hyperlipidemia Mother    Asthma Mother    Parkinson's disease Father    Lung cancer Maternal Grandmother    Other Paternal Grandfather 85       Cardiac arrest   Heart disease Paternal Grandfather    Tourette syndrome Daughter    Family Psychiatric  History: See previous Social History:  Social History   Substance and Sexual Activity  Alcohol Use Yes   Alcohol/week: 4.0 standard drinks   Types: 4 Cans of beer per week   Comment: Ocassional     Social History   Substance and Sexual Activity  Drug Use Yes   Types: Marijuana   Comment: percocet -last use over 1 week ago. Heroin-last used 2/11    Social History   Socioeconomic History   Marital status: Legally Separated    Spouse name: Not on file   Number of children: Not on file   Years of  education: Not on file   Highest education level: Not on file  Occupational History   Not on file  Tobacco Use   Smoking status: Every Day    Packs/day: 1.00    Years: 9.00    Pack years: 9.00    Types: E-cigarettes, Cigarettes   Smokeless tobacco: Never  Vaping Use   Vaping Use: Never used  Substance and Sexual Activity   Alcohol use: Yes    Alcohol/week: 4.0 standard drinks    Types: 4 Cans of beer per week    Comment: Ocassional   Drug use: Yes    Types: Marijuana    Comment: percocet -last use over 1 week ago. Heroin-last used 2/11   Sexual activity: Yes    Birth control/protection: None  Other Topics Concern   Not on file  Social History Narrative   Not on file   Social Determinants of Health   Financial Resource Strain: Not on file  Food Insecurity: Not on file  Transportation Needs: Not on file  Physical Activity: Not on file  Stress: Not on file  Social Connections: Not on file   Additional Social History:                         Sleep: Fair  Appetite:  Fair  Current  Medications: Current Facility-Administered Medications  Medication Dose Route Frequency Provider Last Rate Last Admin   acetaminophen (TYLENOL) tablet 650 mg  650 mg Oral Q6H PRN Arleigh Odowd, [name], MD   650 mg at 01/06/22 1308   alum & mag hydroxide-simeth (MAALOX/MYLANTA) 200-200-20 MG/5ML suspension 30 mL  30 mL Oral Q4H PRN Jarel Cuadra, [name], MD       ARIPiprazole (ABILIFY) tablet 30 mg  30 mg Oral Daily Ethal Gotay T, MD   30 mg at 01/06/22 0810   benztropine (COGENTIN) tablet 1 mg  1 mg Oral Daily Raileigh Sabater T, MD   1 mg at 01/06/22 0809   diphenhydrAMINE (BENADRYL) capsule 50 mg  50 mg Oral Q6H PRN Deloria Lair, NP       Or   diphenhydrAMINE (BENADRYL) injection 50 mg  50 mg Intramuscular Q6H PRN Deloria Lair, NP   50 mg at 01/03/22 0000   divalproex (DEPAKOTE) DR tablet 750 mg  750 mg Oral Q12H Antron Seth T, MD   750 mg at 01/06/22 0809   feeding supplement  (ENSURE ENLIVE / ENSURE PLUS) liquid 237 mL  237 mL Oral BID BM Deklynn Charlet T, MD   237 mL at 01/06/22 1343   hydrOXYzine (ATARAX) tablet 50 mg  50 mg Oral Q6H PRN Edris Friedt T, MD   50 mg at 01/06/22 1343   lithium carbonate (LITHOBID) CR tablet 600 mg  600 mg Oral Q12H Lugene Beougher T, MD   600 mg at 01/06/22 0810   LORazepam (ATIVAN) tablet 2 mg  2 mg Oral Q4H PRN Deloria Lair, NP   2 mg at 01/06/22 0441   Or   LORazepam (ATIVAN) injection 2 mg  2 mg Intramuscular Q4H PRN Deloria Lair, NP   2 mg at 01/03/22 0000   magnesium hydroxide (MILK OF MAGNESIA) suspension 30 mL  30 mL Oral Daily PRN Kyndahl Jablon, [name], MD       nicotine (NICODERM CQ - dosed in mg/24 hours) patch 21 mg  21 mg Transdermal Q0600 Norie Latendresse T, MD   21 mg at 01/06/22 0810   QUEtiapine (SEROQUEL) tablet 150 mg  150 mg Oral QHS Larita Fife, MD   150 mg at 01/05/22 2111    Lab Results:  Results for orders placed or performed during the hospital encounter of 12/25/21 (from the past 48 hour(s))  Lithium level     Status: Abnormal   Collection Time: 01/05/22  8:46 AM  Result Value Ref Range   Lithium Lvl 0.58 (L) 0.60 - 1.20 mmol/L    Comment: Performed at [facility], [address] [place], [place] [address]  Valproic acid level     Status: None   Collection Time: 01/05/22  8:46 AM  Result Value Ref Range   Valproic Acid Lvl 68 50.0 - 100.0 ug/mL    Comment: Performed at [facility], [address] [place], [place] [address]    Blood Alcohol level:  Lab Results  Component Value Date   [facility] <10 12/24/2021   ETH <10 64/33/2951    Metabolic Disorder Labs: Lab Results  Component Value Date   HGBA1C 4.9 12/25/2021   MPG 93.93 12/25/2021   MPG 114 06/02/2021   No results found for: PROLACTIN Lab Results  Component Value Date   CHOL 160 12/25/2021   TRIG 95 12/25/2021   HDL 58 12/25/2021   CHOLHDL 2.8 12/25/2021   VLDL 19 12/25/2021   LDLCALC 83 12/25/2021  [place] 63  06/01/2021    Physical Findings: AIMS:  , ,  ,  ,    CIWA:    COWS:     Musculoskeletal: Strength & Muscle Tone: within normal limits Gait & Station: normal Patient leans: N/A  Psychiatric Specialty Exam:  Presentation  General Appearance: Bizarre; Disheveled  Eye Contact:Good  Speech:Pressured; Slurred  Speech Volume:Decreased  Handedness:Right   Mood and Affect  Mood:Euphoric  Affect:Inappropriate; Full Range   Thought Process  Thought Processes:Disorganized  Descriptions of Associations:Tangential  Orientation:Partial  Thought Content:Obsessions; Paranoid Ideation; Illogical  History of Schizophrenia/Schizoaffective disorder:Yes  Duration of Psychotic Symptoms:Greater than six months  Hallucinations:No data recorded Ideas of Reference:Delusions  Suicidal Thoughts:No data recorded Homicidal Thoughts:No data recorded  Sensorium  Memory:Immediate Poor; Recent Poor; Remote Poor  Judgment:Poor  Insight:Poor   Executive Functions  Concentration:Poor  Attention Span:Poor  Recall:Poor  Fund of Knowledge:Poor  Language:Poor   Psychomotor Activity  Psychomotor Activity:No data recorded  Assets  Assets:Communication Skills; Desire for Improvement; Resilience; Social Support   Sleep  Sleep:No data recorded   Physical Exam: Physical Exam Vitals and nursing note reviewed.  Constitutional:      Appearance: Normal appearance.  HENT:     Head: Normocephalic and atraumatic.     Mouth/Throat:     Pharynx: Oropharynx is clear.  Eyes:     Pupils: Pupils are equal, round, and reactive to light.  Cardiovascular:     Rate and Rhythm: Normal rate and regular rhythm.  Pulmonary:     Effort: Pulmonary effort is normal.     Breath sounds: Normal breath sounds.  Abdominal:     General: Abdomen is flat.     Palpations: Abdomen is soft.  Musculoskeletal:        General: Normal range of motion.  Skin:    General: Skin is warm and dry.   Neurological:     General: No focal deficit present.     Mental Status: She is alert. Mental status is at baseline.  Psychiatric:        Mood and Affect: Mood normal.        Thought Content: Thought content normal.   Review of Systems  Constitutional: Negative.   HENT: Negative.    Eyes: Negative.   Respiratory: Negative.    Cardiovascular: Negative.   Gastrointestinal: Negative.   Musculoskeletal: Negative.   Skin: Negative.   Neurological: Negative.   Psychiatric/Behavioral: Negative.    Blood pressure 94/63, pulse 85, temperature 97.8 F (36.6 C), temperature source Oral, resp. rate 18, height 5\' 3"  (1.6 m), weight 44 kg, SpO2 98 %. Body mass index is 17.18 kg/m.   Treatment Plan Summary: Medication management and Plan patient is doing better and is pretty stable.  Not acutely dangerous at this moment.  Expressing agreement with outpatient treatment.  I am going to plan on discharge probably for tomorrow and have requested a 10-day supply of medicine.  Alethia Berthold, MD 01/06/2022, 3:58 PM

## 2022-01-06 NOTE — Progress Notes (Signed)
Patient presents with a better affect tonight compared to last night. Pt denies SI/HI/AVH Pt compliant with medication administration per MD orders. Pt given education, support, and encouragement to be active in her treatment plan. Pt being monitored Q 15 minutes for safety per unit protocol. Pt remains safe on the unit. ?

## 2022-01-06 NOTE — Plan of Care (Signed)
Patient in & out of room. Even with much prompting patient does not wash her hair with the shampoo staff brought. Patient had Vistaril x 1 for anxiety. Patient stated that  she can not see well without the glasses that makes her nervous. Denies SI,HI and AVH. Appetite and energy level good. Support and encouragement given. ?

## 2022-01-06 NOTE — Group Note (Signed)
[facility] LCSW Group Therapy Note ? ? ?Group Date: 01/06/2022 ?Start Time: 1300 ?End Time: 1400 ? ? ?Type of Therapy/Topic:  Group Therapy:  Emotion Regulation ? ?Participation Level:  Active  ? ? ?Description of Group:   ? The purpose of this group is to assist patients in learning to regulate negative emotions and experience positive emotions. Patients will be guided to discuss ways in which they have been vulnerable to their negative emotions. These vulnerabilities will be juxtaposed with experiences of positive emotions or situations, and patients challenged to use positive emotions to combat negative ones. Special emphasis will be placed on coping with negative emotions in conflict situations, and patients will process healthy conflict resolution skills. ? ?Therapeutic Goals: ?Patient will identify two positive emotions or experiences to reflect on in order to balance out negative emotions:  ?Patient will label two or more emotions that they find the most difficult to experience:  ?Patient will be able to demonstrate positive conflict resolution skills through discussion or role plays:  ? ?Summary of Patient Progress: ? ? ?Patient was present for the entirety of the group session. Patient was an active listener and participated in the topic of discussion. Some of the patients responses to the questions asked were disorganized.  ?Therapeutic Modalities:   ?Cognitive Behavioral Therapy ?Feelings Identification ?Dialectical Behavioral Therapy ? ? ?Karalee Height, Student-Social Work ?

## 2022-01-06 NOTE — Progress Notes (Signed)
Recreation Therapy Notes ? ?Date: 01/06/2022  ? ?Time: 10:50 am    ? ?Location: Craft room   ? ?Behavioral response: Appropriate ? ?Intervention Topic:  Coping Skills  ? ?Discussion/Intervention:  ?Group content on today was focused on coping skills. The group defined what coping skills are and when they normally use coping skills. Individuals described how they normally cope with thing and the coping skills they normally use. Patients expressed why it is important to cope with things and how not coping with things can affect you. The group participated in the intervention ?My coping box? and made coping boxes while adding coping skills they could use in the future to the box. ? ?Clinical Observations/Feedback: ?Patient came to group and expressed that she writes in her diary to cope with things. She stated that coping skills are important to stay out of the hospital. Individual was social with peers and staff while participating in the intervention.   ?Zafira Munos LRT/CTRS  ? ? ? ? ? ? ? ? ? ?Autumn [name] ?01/06/2022 12:25 PM ?

## 2022-01-07 ENCOUNTER — Other Ambulatory Visit: Payer: Self-pay

## 2022-01-07 DIAGNOSIS — F312 Bipolar disorder, current episode manic severe with psychotic features: Secondary | ICD-10-CM | POA: Diagnosis not present

## 2022-01-07 LAB — VALPROIC ACID LEVEL: Valproic Acid Lvl: 95 ug/mL (ref 50.0–100.0)

## 2022-01-07 LAB — LITHIUM LEVEL: Lithium Lvl: 0.97 mmol/L (ref 0.60–1.20)

## 2022-01-07 NOTE — Group Note (Signed)
[facility] LCSW Group Therapy Note ? ? ?Group Date: 01/07/2022 ?Start Time: 1300 ?End Time: 1400 ? ? ?Type of Therapy/Topic:  Group Therapy:  Balance in Life ? ?Participation Level:  Active  ? ?Description of Group:   ? This group will address the concept of balance and how it feels and looks when one is unbalanced. Patients will be encouraged to process areas in their lives that are out of balance, and identify reasons for remaining unbalanced. Facilitators will guide patients utilizing problem- solving interventions to address and correct the stressor making their life unbalanced. Understanding and applying boundaries will be explored and addressed for obtaining  and maintaining a balanced life. Patients will be encouraged to explore ways to assertively make their unbalanced needs known to significant others in their lives, using other group members and facilitator for support and feedback. ? ?Therapeutic Goals: ?Patient will identify two or more emotions or situations they have that consume much of in their lives. ?Patient will identify signs/triggers that life has become out of balance:  ?Patient will identify two ways to set boundaries in order to achieve balance in their lives:  ?Patient will demonstrate ability to communicate their needs through discussion and/or role plays ? ?Summary of Patient Progress: ? ? ? ?Patient was present for the entirety of the group session. Patient was an active listener and participated in the topic of discussion, provided helpful advice to others, and added nuance to topic of conversation. Patient shared that she has difficulty with substances use and relationships. Would like to engage in a more consistent schedule.  ? ? ? ?Therapeutic Modalities:   ?Cognitive Behavioral Therapy ?Solution-Focused Therapy ?Assertiveness Training ? ? ?Durenda Hurt, LCSWA ?

## 2022-01-07 NOTE — Plan of Care (Signed)
?  Problem: Education: ?Goal: Knowledge of [facility] Education information/materials will improve ?Outcome: Progressing ?Goal: Emotional status will improve ?Outcome: Progressing ?Goal: Mental status will improve ?Outcome: Not Progressing ?Goal: Verbalization of understanding the information provided will improve ?Outcome: Not Progressing ?  ?Problem: Activity: ?Goal: Interest or engagement in activities will improve ?Outcome: Not Progressing ?Goal: Sleeping patterns will improve ?Outcome: Progressing ?  ?Problem: Coping: ?Goal: Ability to verbalize frustrations and anger appropriately will improve ?Outcome: Progressing ?Goal: Ability to demonstrate self-control will improve ?Outcome: Progressing ?  ?Problem: Health Behavior/Discharge Planning: ?Goal: Compliance with treatment plan for underlying cause of condition will improve ?Outcome: Progressing ?  ?

## 2022-01-07 NOTE — BH IP Treatment Plan (Signed)
Interdisciplinary Treatment and Diagnostic Plan Update  01/07/2022 Time of Session: 0830  Autumn [name] MRN: [id number]  Principal Diagnosis: Bipolar affective disorder, current episode manic with psychotic symptoms ([place])  Secondary Diagnoses: Principal Problem:   Bipolar affective disorder, current episode manic with psychotic symptoms ([place])   Current Medications:  Current Facility-Administered Medications  Medication Dose Route Frequency Provider Last Rate Last Admin   acetaminophen (TYLENOL) tablet 650 mg  650 mg Oral Q6H PRN Clapacs, [name], MD   650 mg at 01/07/22 0803   alum & mag hydroxide-simeth (MAALOX/MYLANTA) 200-200-20 MG/5ML suspension 30 mL  30 mL Oral Q4H PRN Clapacs, [name], MD       ARIPiprazole (ABILIFY) tablet 30 mg  30 mg Oral Daily Clapacs, [name], MD   30 mg at 01/07/22 0803   benztropine (COGENTIN) tablet 1 mg  1 mg Oral Daily Clapacs, [name], MD   1 mg at 01/07/22 0803   diphenhydrAMINE (BENADRYL) capsule 50 mg  50 mg Oral Q6H PRN Deloria Lair, NP       Or   diphenhydrAMINE (BENADRYL) injection 50 mg  50 mg Intramuscular Q6H PRN Dixon, Rashaun M, NP   50 mg at 01/03/22 0000   divalproex (DEPAKOTE) DR tablet 750 mg  750 mg Oral Q12H Clapacs, [name], MD   750 mg at 01/07/22 0803   feeding supplement (ENSURE ENLIVE / ENSURE PLUS) liquid 237 mL  237 mL Oral BID BM Clapacs, [name], MD   237 mL at 01/07/22 0955   hydrOXYzine (ATARAX) tablet 50 mg  50 mg Oral Q6H PRN Clapacs, [name], MD   50 mg at 01/07/22 0803   lithium carbonate (LITHOBID) CR tablet 600 mg  600 mg Oral Q12H Clapacs, [name], MD   600 mg at 01/07/22 0803   LORazepam (ATIVAN) tablet 2 mg  2 mg Oral Q4H PRN Deloria Lair, NP   2 mg at 01/06/22 2057   Or   LORazepam (ATIVAN) injection 2 mg  2 mg Intramuscular Q4H PRN Deloria Lair, NP   2 mg at 01/03/22 0000   magnesium hydroxide (MILK OF MAGNESIA) suspension 30 mL  30 mL Oral Daily PRN Clapacs, [name], MD       nicotine (NICODERM CQ - dosed in  mg/24 hours) patch 21 mg  21 mg Transdermal Q0600 Clapacs, [name], MD   21 mg at 01/07/22 0805   QUEtiapine (SEROQUEL) tablet 150 mg  150 mg Oral QHS Paliy, Delrae Rend, MD   150 mg at 01/06/22 2057   PTA Medications: Medications Prior to Admission  Medication Sig Dispense Refill Last Dose   ARIPiprazole (ABILIFY) 10 MG tablet Take 1 tablet (10 mg total) by mouth daily. (Patient not taking: Reported on 12/24/2021) 30 tablet 1    divalproex (DEPAKOTE) 250 MG DR tablet Take 1 tablet (250 mg total) by mouth in the morning. (Patient not taking: Reported on 12/24/2021) 30 tablet 1    divalproex (DEPAKOTE) 500 MG DR tablet Take 1 tablet (500 mg total) by mouth at bedtime. (Patient not taking: Reported on 12/24/2021) 30 tablet 1    hydrOXYzine (ATARAX/VISTARIL) 25 MG tablet Take 1 tablet (25 mg total) by mouth 3 (three) times daily as needed for itching (itching, rash). (Patient not taking: Reported on 12/24/2021) 60 tablet 1    traZODone (DESYREL) 50 MG tablet Take 1 tablet (50 mg total) by mouth at bedtime as needed for sleep. (Patient not taking: Reported on 12/24/2021) 30 tablet  1     Patient Stressors: Health problems   Medication change or noncompliance   Substance abuse   Traumatic event    Patient Strengths: Ability for insight  Supportive family/friends   Treatment Modalities: Medication Management, Group therapy, Case management,  1 to 1 session with clinician, Psychoeducation, Recreational therapy.   Physician Treatment Plan for Primary Diagnosis: Bipolar affective disorder, current episode manic with psychotic symptoms ([place]) Long Term Goal(s): Improvement in symptoms so as ready for discharge   Short Term Goals: Ability to identify changes in lifestyle to reduce recurrence of condition will improve Ability to verbalize feelings will improve Ability to disclose and discuss suicidal ideas Ability to demonstrate self-control will improve Ability to identify and develop effective coping  behaviors will improve Ability to maintain clinical measurements within normal limits will improve Compliance with prescribed medications will improve Ability to identify triggers associated with substance abuse/mental health issues will improve  Medication Management: Evaluate patient's response, side effects, and tolerance of medication regimen.  Therapeutic Interventions: 1 to 1 sessions, Unit Group sessions and Medication administration.  Evaluation of Outcomes: Progressing  Physician Treatment Plan for Secondary Diagnosis: Principal Problem:   Bipolar affective disorder, current episode manic with psychotic symptoms ([place])  Long Term Goal(s): Improvement in symptoms so as ready for discharge   Short Term Goals: Ability to identify changes in lifestyle to reduce recurrence of condition will improve Ability to verbalize feelings will improve Ability to disclose and discuss suicidal ideas Ability to demonstrate self-control will improve Ability to identify and develop effective coping behaviors will improve Ability to maintain clinical measurements within normal limits will improve Compliance with prescribed medications will improve Ability to identify triggers associated with substance abuse/mental health issues will improve     Medication Management: Evaluate patient's response, side effects, and tolerance of medication regimen.  Therapeutic Interventions: 1 to 1 sessions, Unit Group sessions and Medication administration.  Evaluation of Outcomes: Progressing   RN Treatment Plan for Primary Diagnosis: Bipolar affective disorder, current episode manic with psychotic symptoms ([place]) Long Term Goal(s): Knowledge of disease and therapeutic regimen to maintain health will improve  Short Term Goals: Ability to remain free from injury will improve, Ability to verbalize frustration and anger appropriately will improve, Ability to demonstrate self-control, Ability to participate in  decision making will improve, Ability to verbalize feelings will improve, Ability to disclose and discuss suicidal ideas, Ability to identify and develop effective coping behaviors will improve, and Compliance with prescribed medications will improve  Medication Management: RN will administer medications as ordered by provider, will assess and evaluate patient's response and provide education to patient for prescribed medication. RN will report any adverse and/or side effects to prescribing provider.  Therapeutic Interventions: 1 on 1 counseling sessions, Psychoeducation, Medication administration, Evaluate responses to treatment, Monitor vital signs and CBGs as ordered, Perform/monitor CIWA, COWS, AIMS and Fall Risk screenings as ordered, Perform wound care treatments as ordered.  Evaluation of Outcomes: Progressing   LCSW Treatment Plan for Primary Diagnosis: Bipolar affective disorder, current episode manic with psychotic symptoms ([place]) Long Term Goal(s): Safe transition to appropriate next level of care at discharge, Engage patient in therapeutic group addressing interpersonal concerns.  Short Term Goals: Engage patient in aftercare planning with referrals and resources, Increase social support, Increase ability to appropriately verbalize feelings, Increase emotional regulation, Facilitate acceptance of mental health diagnosis and concerns, Facilitate patient progression through stages of change regarding substance use diagnoses and concerns, Identify triggers associated with mental health/substance abuse issues,  and Increase skills for wellness and recovery  Therapeutic Interventions: Assess for all discharge needs, 1 to 1 time with Social worker, Explore available resources and support systems, Assess for adequacy in community support network, Educate family and significant other(s) on suicide prevention, Complete Psychosocial Assessment, Interpersonal group therapy.  Evaluation of Outcomes:  Progressing   Progress in Treatment: Attending groups: Yes. Participating in groups: Yes. Taking medication as prescribed: Yes. Toleration medication: Yes. Family/Significant other contact made: Yes, individual(s) contacted:  CSW has contacted mother, Gayland Curry to complete SPE  Patient understands diagnosis: Yes. Discussing patient identified problems/goals with staff: Yes. Medical problems stabilized or resolved: Yes. Denies suicidal/homicidal ideation: Yes. Issues/concerns per patient self-inventory: Yes. Other: none   New problem(s) identified: No, Describe:  No additional problems identified at this time. Patient continues to be delusional, paranoid, and often inappropriate in conversation.   New Short Term/Long Term Goal(s): Patient to work towards detox, elimination of symptoms of psychosis, medication management for mood stabilization; development of comprehensive mental wellness/sobriety plan.  Patient Goals:  No additional goals identified at this time.    Discharge Plan or Barriers: No psychosocial barriers identified at this time, patient to return to place of residence when appropriate for discharge.   Reason for Continuation of Hospitalization: Delusions   Estimated Length of Stay: 1-7 days   Scribe for Treatment Team: Larose Kells 01/07/2022 10:01 AM

## 2022-01-07 NOTE — Progress Notes (Signed)
[facility] MD Progress Note  01/07/2022 2:30 PM Autumn [name]  MRN:  [id number] Subjective: Follow-up for this patient with schizoaffective disorder.  Patient reports she is still having some auditory hallucinations.  Feels anxious.  Also feels a little over sedated at times.  Has not shown dangerous behavior in the last day.  Staff still noticed some psychotic features at times. Principal Problem: Bipolar affective disorder, current episode manic with psychotic symptoms ([place]) Diagnosis: Principal Problem:   Bipolar affective disorder, current episode manic with psychotic symptoms ([place])  Total Time spent with patient: 30 minutes  Past Psychiatric History: Past history of recurrent psychosis from schizoaffective disorder  Past Medical History:  Past Medical History:  Diagnosis Date   Allergy    Seasonal, Ultram (seizures)   Anemia 2005   Bipolar 1 disorder ([place])    Glaucoma    Seizures ([place]) 2008    Past Surgical History:  Procedure Laterality Date   BREAST EXCISIONAL BIOPSY Right    x 2   CESAREAN SECTION     x 2   LEEP  2000   Family History:  Family History  Problem Relation Age of Onset   Hypertension Mother    Hyperlipidemia Mother    Asthma Mother    Parkinson's disease Father    Lung cancer Maternal Grandmother    Other Paternal Grandfather 48       Cardiac arrest   Heart disease Paternal Grandfather    Tourette syndrome Daughter    Family Psychiatric  History: See previous Social History:  Social History   Substance and Sexual Activity  Alcohol Use Yes   Alcohol/week: 4.0 standard drinks   Types: 4 Cans of beer per week   Comment: Ocassional     Social History   Substance and Sexual Activity  Drug Use Yes   Types: Marijuana   Comment: percocet -last use over 1 week ago. Heroin-last used 2/11    Social History   Socioeconomic History   Marital status: Legally Separated    Spouse name: Not on file   Number of children: Not on file   Years of education:  Not on file   Highest education level: Not on file  Occupational History   Not on file  Tobacco Use   Smoking status: Every Day    Packs/day: 1.00    Years: 9.00    Pack years: 9.00    Types: E-cigarettes, Cigarettes   Smokeless tobacco: Never  Vaping Use   Vaping Use: Never used  Substance and Sexual Activity   Alcohol use: Yes    Alcohol/week: 4.0 standard drinks    Types: 4 Cans of beer per week    Comment: Ocassional   Drug use: Yes    Types: Marijuana    Comment: percocet -last use over 1 week ago. Heroin-last used 2/11   Sexual activity: Yes    Birth control/protection: None  Other Topics Concern   Not on file  Social History Narrative   Not on file   Social Determinants of Health   Financial Resource Strain: Not on file  Food Insecurity: Not on file  Transportation Needs: Not on file  Physical Activity: Not on file  Stress: Not on file  Social Connections: Not on file   Additional Social History:                         Sleep: Fair  Appetite:  Fair  Current Medications: Current Facility-Administered Medications  Medication Dose Route Frequency Provider Last Rate Last Admin   acetaminophen (TYLENOL) tablet 650 mg  650 mg Oral Q6H PRN Charlise Giovanetti, [name], MD   650 mg at 01/07/22 0803   alum & mag hydroxide-simeth (MAALOX/MYLANTA) 200-200-20 MG/5ML suspension 30 mL  30 mL Oral Q4H PRN Huntleigh Doolen, [name], MD       ARIPiprazole (ABILIFY) tablet 30 mg  30 mg Oral Daily Hall Birchard T, MD   30 mg at 01/07/22 0803   diphenhydrAMINE (BENADRYL) capsule 50 mg  50 mg Oral Q6H PRN Deloria Lair, NP       Or   diphenhydrAMINE (BENADRYL) injection 50 mg  50 mg Intramuscular Q6H PRN Dixon, Rashaun M, NP   50 mg at 01/03/22 0000   divalproex (DEPAKOTE) DR tablet 750 mg  750 mg Oral Q12H Leemon Ayala T, MD   750 mg at 01/07/22 0803   feeding supplement (ENSURE ENLIVE / ENSURE PLUS) liquid 237 mL  237 mL Oral BID BM Kiko Ripp T, MD   237 mL at 01/07/22 1353    hydrOXYzine (ATARAX) tablet 50 mg  50 mg Oral Q6H PRN Christophe Rising T, MD   50 mg at 01/07/22 1359   lithium carbonate (LITHOBID) CR tablet 600 mg  600 mg Oral Q12H Fernando Stoiber T, MD   600 mg at 01/07/22 0803   LORazepam (ATIVAN) tablet 2 mg  2 mg Oral Q4H PRN Deloria Lair, NP   2 mg at 01/06/22 2057   Or   LORazepam (ATIVAN) injection 2 mg  2 mg Intramuscular Q4H PRN Deloria Lair, NP   2 mg at 01/03/22 0000   magnesium hydroxide (MILK OF MAGNESIA) suspension 30 mL  30 mL Oral Daily PRN Kylah Maresh T, MD       nicotine (NICODERM CQ - dosed in mg/24 hours) patch 21 mg  21 mg Transdermal Q0600 Tsuruko Murtha T, MD   21 mg at 01/07/22 0805   QUEtiapine (SEROQUEL) tablet 150 mg  150 mg Oral QHS Larita Fife, MD   150 mg at 01/06/22 2057    Lab Results: No results found for this or any previous visit (from the past 48 hour(s)).  Blood Alcohol level:  Lab Results  Component Value Date   ETH <10 12/24/2021   ETH <10 93/26/7124    Metabolic Disorder Labs: Lab Results  Component Value Date   HGBA1C 4.9 12/25/2021   MPG 93.93 12/25/2021   MPG 114 06/02/2021   No results found for: PROLACTIN Lab Results  Component Value Date   CHOL 160 12/25/2021   TRIG 95 12/25/2021   HDL 58 12/25/2021   CHOLHDL 2.8 12/25/2021   VLDL 19 12/25/2021   LDLCALC 83 12/25/2021   LDLCALC 63 06/01/2021    Physical Findings: AIMS:  , ,  ,  ,    CIWA:    COWS:     Musculoskeletal: Strength & Muscle Tone: within normal limits Gait & Station: normal Patient leans: N/A  Psychiatric Specialty Exam:  Presentation  General Appearance: Bizarre; Disheveled  Eye Contact:Good  Speech:Pressured; Slurred  Speech Volume:Decreased  Handedness:Right   Mood and Affect  Mood:Euphoric  Affect:Inappropriate; Full Range   Thought Process  Thought Processes:Disorganized  Descriptions of Associations:Tangential  Orientation:Partial  Thought Content:Obsessions; Paranoid Ideation;  Illogical  History of Schizophrenia/Schizoaffective disorder:Yes  Duration of Psychotic Symptoms:Greater than six months  Hallucinations:No data recorded Ideas of Reference:Delusions  Suicidal Thoughts:No data recorded Homicidal Thoughts:No data recorded  Sensorium  Memory:Immediate  Poor; Recent Poor; Remote Poor  Judgment:Poor  Insight:Poor   Executive Functions  Concentration:Poor  Attention Span:Poor  Recall:Poor  Fund of Knowledge:Poor  Language:Poor   Psychomotor Activity  Psychomotor Activity:No data recorded  Assets  Assets:Communication Skills; Desire for Improvement; Resilience; Social Support   Sleep  Sleep:No data recorded   Physical Exam: Physical Exam Vitals and nursing note reviewed.  Constitutional:      Appearance: Normal appearance.  HENT:     Head: Normocephalic and atraumatic.     Mouth/Throat:     Pharynx: Oropharynx is clear.  Eyes:     Pupils: Pupils are equal, round, and reactive to light.  Cardiovascular:     Rate and Rhythm: Normal rate and regular rhythm.  Pulmonary:     Effort: Pulmonary effort is normal.     Breath sounds: Normal breath sounds.  Abdominal:     General: Abdomen is flat.     Palpations: Abdomen is soft.  Musculoskeletal:        General: Normal range of motion.  Skin:    General: Skin is warm and dry.  Neurological:     General: No focal deficit present.     Mental Status: She is alert. Mental status is at baseline.  Psychiatric:        Attention and Perception: She is inattentive. She perceives auditory hallucinations.        Mood and Affect: Mood normal. Affect is blunt.        Speech: Speech is delayed.        Behavior: Behavior is slowed.        Thought Content: Thought content normal.        Cognition and Memory: Memory is impaired.   Review of Systems  Constitutional: Negative.   HENT: Negative.    Eyes: Negative.   Respiratory: Negative.    Cardiovascular: Negative.   Gastrointestinal:  Negative.   Musculoskeletal: Negative.   Skin: Negative.   Neurological: Negative.   Psychiatric/Behavioral:  Positive for hallucinations. Negative for depression, substance abuse and suicidal ideas. The patient is nervous/anxious and has insomnia.   Blood pressure 97/65, pulse 80, temperature 97.9 F (36.6 C), temperature source Oral, resp. rate 17, height 5\' 3"  (1.6 m), weight 44 kg, SpO2 99 %. Body mass index is 17.18 kg/m.   Treatment Plan Summary: Medication management and Plan I am going to recheck the lithium and Depakote levels today also hold the Cogentin in the morning in an attempt to find a way to make her feel less sedated.  We are going to plan on likely discharge tomorrow with follow-[facility] at [facility].  Alethia Berthold, MD 01/07/2022, 2:30 PM

## 2022-01-07 NOTE — Progress Notes (Signed)
Recreation Therapy Notes ? ?  ?Date: 01/07/2022 ?  ?Time: 10:25 am  ?  ?Location: Craft room  ?  ?Behavioral response: Appropriate ?  ?Intervention Topic:  Relaxation   ?  ?Discussion/Intervention:  ?Group content today was focused on relaxation. The group defined relaxation and identified healthy ways to relax. Individuals expressed how much time they spend relaxing. Patients expressed how much their life would be if they did not make time for themselves to relax. The group stated ways they could improve their relaxation techniques in the future.  Individuals participated in the intervention ?Time to Relax? where they had a chance to experience different relaxation techniques.  ?  ?Clinical Observations/Feedback: ?Patient came to group and defined relaxation as decompressing from a stressful time. She stated that relaxation is important for your mental health. Individual was social with peers and staff while participating in the intervention.   ?Grainne Knights LRT/CTRS  ? ? ? ? ? ? ?Simmie Camerer ?01/07/2022 12:57 PM ?

## 2022-01-07 NOTE — Progress Notes (Signed)
D: Pt denies SI/HI/AVH. Endorsees anxiety and depression. Prn medication given for anxiety. Rates 6/10 and endorses pain. See pain assessment for details.  ? ?A: Pt provided support and encouragement. Pt given medication per protocol and standing orders. Q85m safety checks implemented and continued.  ? ?R: Pt safe on the unit. Will continue to monitor.  ?

## 2022-01-07 NOTE — Progress Notes (Signed)
Patient presents with a better affect tonight compared to last night. Pt denies SI/HI/AVH Pt compliant with medication administration per MD orders. Pt given education, support, and encouragement to be active in her treatment plan. Pt being monitored Q 15 minutes for safety per unit protocol. Pt remains safe on the unit. ?

## 2022-01-08 MED ORDER — LITHIUM CARBONATE ER 300 MG PO TBCR: 600.0000 mg | EXTENDED_RELEASE_TABLET | Freq: Two times a day (BID) | ORAL | 1 refills | Status: DC

## 2022-01-08 MED ORDER — ARIPIPRAZOLE 30 MG PO TABS: 30.0000 mg | ORAL_TABLET | Freq: Every day | ORAL | 1 refills | Status: DC

## 2022-01-08 MED ORDER — NICOTINE 21 MG/24HR TD PT24: 21.0000 mg | MEDICATED_PATCH | Freq: Every day | TRANSDERMAL | 1 refills | Status: DC

## 2022-01-08 MED ORDER — QUETIAPINE FUMARATE 150 MG PO TABS
150.0000 mg | ORAL_TABLET | Freq: Every day | ORAL | 1 refills | Status: DC
Start: 2022-01-08 — End: 2022-02-05

## 2022-01-08 MED ORDER — DIVALPROEX SODIUM 250 MG PO DR TAB: 750.0000 mg | DELAYED_RELEASE_TABLET | Freq: Two times a day (BID) | ORAL | 1 refills | Status: DC

## 2022-01-08 MED ORDER — HYDROXYZINE HCL 50 MG PO TABS: 50.0000 mg | ORAL_TABLET | Freq: Four times a day (QID) | ORAL | 1 refills | Status: DC | PRN

## 2022-01-08 NOTE — Plan of Care (Signed)
?  Problem: Education: ?Goal: Knowledge of [facility] [place] [facility] Education information/materials will improve ?Outcome: Adequate for Discharge ?Goal: Emotional status will improve ?Outcome: Adequate for Discharge ?Goal: Mental status will improve ?Outcome: Adequate for Discharge ?Goal: Verbalization of understanding the information provided will improve ?Outcome: Adequate for Discharge ?  ?Problem: Activity: ?Goal: Interest or engagement in activities will improve ?Outcome: Adequate for Discharge ?Goal: Sleeping patterns will improve ?Outcome: Adequate for Discharge ?  ?Problem: Coping: ?Goal: Ability to verbalize frustrations and anger appropriately will improve ?Outcome: Adequate for Discharge ?Goal: Ability to demonstrate self-control will improve ?Outcome: Adequate for Discharge ?  ?Problem: Health Behavior/Discharge Planning: ?Goal: Identification of resources available to assist in meeting health care needs will improve ?Outcome: Adequate for Discharge ?Goal: Compliance with treatment plan for underlying cause of condition will improve ?Outcome: Adequate for Discharge ?  ?Problem: Physical Regulation: ?Goal: Ability to maintain clinical measurements within normal limits will improve ?Outcome: Adequate for Discharge ?  ?Problem: Safety: ?Goal: Periods of time without injury will increase ?Outcome: Adequate for Discharge ?  ?Problem: Education: ?Goal: Knowledge of disease or condition will improve ?Outcome: Adequate for Discharge ?Goal: Understanding of discharge needs will improve ?Outcome: Adequate for Discharge ?  ?Problem: Health Behavior/Discharge Planning: ?Goal: Ability to identify changes in lifestyle to reduce recurrence of condition will improve ?Outcome: Adequate for Discharge ?Goal: Identification of resources available to assist in meeting health care needs will improve ?Outcome: Adequate for Discharge ?  ?Problem: Physical Regulation: ?Goal: Complications related to the disease process, condition or  treatment will be avoided or minimized ?Outcome: Adequate for Discharge ?  ?Problem: Safety: ?Goal: Ability to remain free from injury will improve ?Outcome: Adequate for Discharge ?  ?

## 2022-01-08 NOTE — Progress Notes (Signed)
Recreation Therapy Notes ? ?INPATIENT RECREATION TR PLAN ? ?Patient Details ?Name: Autumn [name] ?MRN: [id number] ?DOB: 02/26/1976 ?Today's Date: 01/08/2022 ? ?Rec Therapy Plan ?Is patient appropriate for Therapeutic Recreation?: Yes ?Treatment times per week: at least 3 ?Estimated Length of Stay: 5-7 days ?TR Treatment/Interventions: Group participation (Comment) ? ?Discharge Criteria ?Pt will be discharged from therapy if:: Discharged ?Treatment plan/goals/alternatives discussed and agreed upon by:: Patient/family ? ?Discharge Summary ?Short term goals set: Patient will engage in groups without prompting or encouragement from LRT x3 group sessions within 5 recreation therapy group sessions ?Short term goals met: Complete ?Progress toward goals comments: Groups attended ?Which groups?: Leisure education, Stress management, Coping skills, Wellness, Social skills, Other (Comment) (Time Management, Self-care, Relaxation) ?Reason goals not met: N/A ?Therapeutic equipment acquired: N/A ?Reason patient discharged from therapy: Discharge from hospital ?Pt/family agrees with progress & goals achieved: Yes ?Date patient discharged from therapy: 01/08/22 ? ? ?Samvel Zinn ?01/08/2022, 3:25 PM ?

## 2022-01-08 NOTE — Progress Notes (Signed)
D: Pt alert and oriented. Pt denies experiencing any pain, SI/HI, or AVH at this time. Pt reports she will be able to keep herself safe when she returns home. ?  ?A: Pt received discharge and medication education/information. Pt did not have belongings  ? belongings were returned and signed for at this time to include printed prescriptions and valuables.  ?  ?R: Pt verbalized understanding of discharge and medication education/information. ?  ?Pt escorted by staff to the physician on call  lot where they will patient will catch the link transit.    ?

## 2022-01-08 NOTE — Progress Notes (Signed)
D: Pt awake and alert this shift. Present on the unit. Denies SI/HI/AVH  ? ?A: Pt provided support and encouragement. Pt given medication per protocol and standing orders. Q59m safety checks implemented and continued.  ? ?R: Pt safe on the unit. Will continue to monitor.  ?

## 2022-01-08 NOTE — Progress Notes (Signed)
Recreation Therapy Notes ? ?Date: 01/08/2022 ? ?Time: 10:30 am   ? ?Location: Craft room   ? ?Behavioral response: N/A ?  ?Intervention Topic: Communication   ? ?Discussion/Intervention: ?Patient refused to attend group.  ? ?Clinical Observations/Feedback:  ?Patient refused to attend group.  ?  ?Jewelene Mairena LRT/CTRS ? ? ? ? ? ? ? ?Levis Nazir ?01/08/2022 12:21 PM ?

## 2022-01-08 NOTE — [facility] Suicide Risk Assessment (Signed)
[facility] Discharge Suicide Risk Assessment ? ? ?Principal Problem: Bipolar affective disorder, current episode manic with psychotic symptoms ([place]) ?Discharge Diagnoses: Principal Problem: ?  Bipolar affective disorder, current episode manic with psychotic symptoms ([place]) ? ? ?Total Time spent with patient: 30 minutes ? ?Musculoskeletal: ?Strength & Muscle Tone: within normal limits ?Gait & Station: normal ?Patient leans: N/A ? ?Psychiatric Specialty Exam ? ?Presentation  ?General Appearance: Bizarre; Disheveled ? ?Eye Contact:Good ? ?Speech:Pressured; Slurred ? ?Speech Volume:Decreased ? ?Handedness:Right ? ? ?Mood and Affect  ?Mood:Euphoric ? ?Duration of Depression Symptoms: Greater than two weeks ? ?Affect:Inappropriate; Full Range ? ? ?Thought Process  ?Thought Processes:Disorganized ? ?Descriptions of Associations:Tangential ? ?Orientation:Partial ? ?Thought Content:Obsessions; Paranoid Ideation; Illogical ? ?History of Schizophrenia/Schizoaffective disorder:Yes ? ?Duration of Psychotic Symptoms:Greater than six months ? ?Hallucinations:No data recorded ?Ideas of Reference:Delusions ? ?Suicidal Thoughts:No data recorded ?Homicidal Thoughts:No data recorded ? ?Sensorium  ?Memory:Immediate Poor; Recent Poor; Remote Poor ? ?Judgment:Poor ? ?Insight:Poor ? ? ?Executive Functions  ?Concentration:Poor ? ?Attention Span:Poor ? ?Recall:Poor ? ?Fund of Knowledge:Poor ? ?Language:Poor ? ? ?Psychomotor Activity  ?Psychomotor Activity:No data recorded ? ?Assets  ?Assets:Communication Skills; Desire for Improvement; Resilience; Social Support ? ? ?Sleep  ?Sleep:No data recorded ? ?Physical Exam: ?Physical Exam ?Vitals and nursing note reviewed.  ?Constitutional:   ?   Appearance: Normal appearance.  ?HENT:  ?   Head: Normocephalic and atraumatic.  ?   Mouth/Throat:  ?   Pharynx: Oropharynx is clear.  ?Eyes:  ?   Pupils: Pupils are equal, round, and reactive to light.  ?Cardiovascular:  ?   Rate and Rhythm: Normal rate and regular  rhythm.  ?Pulmonary:  ?   Effort: Pulmonary effort is normal.  ?   Breath sounds: Normal breath sounds.  ?Abdominal:  ?   General: Abdomen is flat.  ?   Palpations: Abdomen is soft.  ?Musculoskeletal:     ?   General: Normal range of motion.  ?Skin: ?   General: Skin is warm and dry.  ?Neurological:  ?   General: No focal deficit present.  ?   Mental Status: She is alert. Mental status is at baseline.  ?Psychiatric:     ?   Mood and Affect: Mood normal.     ?   Thought Content: Thought content normal.  ? ?Review of Systems  ?Constitutional: Negative.   ?HENT: Negative.    ?Eyes: Negative.   ?Respiratory: Negative.    ?Cardiovascular: Negative.   ?Gastrointestinal: Negative.   ?Musculoskeletal: Negative.   ?Skin: Negative.   ?Neurological: Negative.   ?Psychiatric/Behavioral: Negative.    ?Blood pressure (!) 85/66, pulse 79, temperature 98.4 ?F (36.9 ?C), temperature source Oral, resp. rate 18, height 5\' 3"  (1.6 m), weight 44 kg, SpO2 100 %. Body mass index is 17.18 kg/m?. ? ?Mental Status Per Nursing Assessment::   ?On Admission:  NA ? ?Demographic Factors:  ?Low socioeconomic status, Living alone, and Unemployed ? ?Loss Factors: ?Financial problems/change in socioeconomic status ? ?Historical Factors: ?Prior suicide attempts and Impulsivity ? ?Risk Reduction Factors:   ?Positive therapeutic relationship ? ?Continued Clinical Symptoms:  ?Bipolar Disorder:   Mixed State ?Alcohol/Substance Abuse/Dependencies ?Schizophrenia:   Depressive state ? ?Cognitive Features That Contribute To Risk:  ?None   ? ?Suicide Risk:  ?Minimal: No identifiable suicidal ideation.  Patients presenting with no risk factors but with morbid ruminations; may be classified as minimal risk based on the severity of the depressive symptoms ? ? Follow-up Information   ? ? [place].  Go on 01/11/2022.   ?Why: Lanae Boast, peer support, will pick you up for your appointment on Monday 01/11/22 at 0715AM. ?Contact information: ?[address] ?[place] [address] ?[phone] ? ? ?  ?  ? ?  ?  ? ?  ? ? ?Plan Of Care/Follow-up recommendations:  ?Patient is agreeable to following up with [facility] health services and continuing current medication while avoiding drug abuse.  Will meet with [facility] by phone over the weekend and then on Monday.  Patient is denying any suicidal ideation at all and does not appear psychotic ? ?Alethia Berthold, MD ?01/08/2022, 10:22 AM ?

## 2022-01-08 NOTE — Progress Notes (Signed)
?  [facility] Adult Case Management Discharge Plan : ? ?Will you be returning to the same living situation after discharge:  Yes,  Patient to return to boarding house.  ?At discharge, do you have transportation home?: Yes,  Patient requests to leave via [place] bus, courtesy car arranged from physician on-call lot to bus stop.  ?Do you have the ability to pay for your medications: No. Provided w/ 7 day medicine supply  ? ?Release of information consent forms completed and in the chart;  [name] ? ?Patient to Follow up at: ? Follow-up Information   ? ? [facility] on 01/11/2022.   ?Why: Lanae Boast, peer support, will pick you up for your appointment on Monday 01/11/22 at 0715AM. ?Contact information: ?[address] ?[place] [address] ?[phone] ? ? ?  ?  ? ?  ?  ? ?  ? ? ?Next level of care provider has access to [place] [facility] ? ?Safety Planning and Suicide Prevention discussed: Yes,  SPE completed w/ patient and Cyntia ball, mother.  ?  ?Has patient been referred to the Quitline?: Patient refused referral ? ?Patient has been referred for addiction treatment: Yes ? ?Shirl Harris, LCSW ?01/08/2022, 10:42 AM ?

## 2022-01-08 NOTE — Discharge Summary (Signed)
Physician Discharge Summary Note  Patient:  Autumn [name] is an 46 y.o., female MRN:  [id number] DOB:  08-Jan-1976 Patient phone:  [phone] (home)  Patient address:   [place] [address],  Total Time spent with patient: 30 minutes  Date of Admission:  12/25/2021 Date of Discharge: 01-08-2022  Reason for Admission: Patient admitted because of return of psychotic symptoms with bizarre behavior or self-injury substance abuse hallucinations  Principal Problem: Bipolar affective disorder, current episode manic with psychotic symptoms ([place]) Discharge Diagnoses: Principal Problem:   Bipolar affective disorder, current episode manic with psychotic symptoms ([place])   Past Psychiatric History: History of chronic psychotic disorder complicated by substance abuse  Past Medical History:  Past Medical History:  Diagnosis Date   Allergy    Seasonal, Ultram (seizures)   Anemia 2005   Bipolar 1 disorder ([place])    Glaucoma    Seizures ([place]) 2008    Past Surgical History:  Procedure Laterality Date   BREAST EXCISIONAL BIOPSY Right    x 2   CESAREAN SECTION     x 2   LEEP  2000   Family History:  Family History  Problem Relation Age of Onset   Hypertension Mother    Hyperlipidemia Mother    Asthma Mother    Parkinson's disease Father    Lung cancer Maternal Grandmother    Other Paternal Grandfather 90       Cardiac arrest   Heart disease Paternal Grandfather    Tourette syndrome Daughter    Family Psychiatric  History: See previous Social History:  Social History   Substance and Sexual Activity  Alcohol Use Yes   Alcohol/week: 4.0 standard drinks   Types: 4 Cans of beer per week   Comment: Ocassional     Social History   Substance and Sexual Activity  Drug Use Yes   Types: Marijuana   Comment: percocet -last use over 1 week ago. Heroin-last used 2/11    Social History   Socioeconomic History   Marital status: Legally Separated    Spouse name: Not  on file   Number of children: Not on file   Years of education: Not on file   Highest education level: Not on file  Occupational History   Not on file  Tobacco Use   Smoking status: Every Day    Packs/day: 1.00    Years: 9.00    Pack years: 9.00    Types: E-cigarettes, Cigarettes   Smokeless tobacco: Never  Vaping Use   Vaping Use: Never used  Substance and Sexual Activity   Alcohol use: Yes    Alcohol/week: 4.0 standard drinks    Types: 4 Cans of beer per week    Comment: Ocassional   Drug use: Yes    Types: Marijuana    Comment: percocet -last use over 1 week ago. Heroin-last used 2/11   Sexual activity: Yes    Birth control/protection: None  Other Topics Concern   Not on file  Social History Narrative   Not on file   Social Determinants of Health   Financial Resource Strain: Not on file  Food Insecurity: Not on file  Transportation Needs: Not on file  Physical Activity: Not on file  Stress: Not on file  Social Connections: Not on file    Hospital Course: Patient admitted to psychiatric hospital.  15-minute checks continued.  Patient was clearly suffering from psychotic symptoms on admission.  She did not harm anyone else but on a  couple of occasions struck herself in the face.  Seemed to be responding to internal stimuli initially.  She was however compliant with medicines.  Patient was started back on combination Abilify Depakote and lithium.  Tolerated medicines well.  Blood levels early on were low.  Dosage increased somewhat.  Repeat blood level still a little low but after giving it another couple days rechecked and are up into the therapeutic level.  Patient is taking Seroquel at night for sleep and Abilify daily for psychosis.  She has met with the treatment team and met with outpatient providers.  At the time of discharge she completely denies suicidal ideation and presents as attentive cooperative and not reporting current hallucinations.  Agrees with plan to take  medication regularly and stay away from drugs and follow up with [facility].  Physical Findings: AIMS:  , ,  ,  ,    CIWA:    COWS:     Musculoskeletal: Strength & Muscle Tone: within normal limits Gait & Station: normal Patient leans: N/A   Psychiatric Specialty Exam:  Presentation  General Appearance: Bizarre; Disheveled  Eye Contact:Good  Speech:Pressured; Slurred  Speech Volume:Decreased  Handedness:Right   Mood and Affect  Mood:Euphoric  Affect:Inappropriate; Full Range   Thought Process  Thought Processes:Disorganized  Descriptions of Associations:Tangential  Orientation:Partial  Thought Content:Obsessions; Paranoid Ideation; Illogical  History of Schizophrenia/Schizoaffective disorder:Yes  Duration of Psychotic Symptoms:Greater than six months  Hallucinations:No data recorded Ideas of Reference:Delusions  Suicidal Thoughts:No data recorded Homicidal Thoughts:No data recorded  Sensorium  Memory:Immediate Poor; Recent Poor; Remote Poor  Judgment:Poor  Insight:Poor   Executive Functions  Concentration:Poor  Attention Span:Poor  Recall:Poor  Fund of Knowledge:Poor  Language:Poor   Psychomotor Activity  Psychomotor Activity:No data recorded  Assets  Assets:Communication Skills; Desire for Improvement; Resilience; Social Support   Sleep  Sleep:No data recorded   Physical Exam: Physical Exam Vitals and nursing note reviewed.  Constitutional:      Appearance: Normal appearance.  HENT:     Head: Normocephalic and atraumatic.     Mouth/Throat:     Pharynx: Oropharynx is clear.  Eyes:     Pupils: Pupils are equal, round, and reactive to light.  Cardiovascular:     Rate and Rhythm: Normal rate and regular rhythm.  Pulmonary:     Effort: Pulmonary effort is normal.     Breath sounds: Normal breath sounds.  Abdominal:     General: Abdomen is flat.     Palpations: Abdomen is soft.  Musculoskeletal:        General: Normal range of  motion.  Skin:    General: Skin is warm and dry.  Neurological:     General: No focal deficit present.     Mental Status: She is alert. Mental status is at baseline.  Psychiatric:        Mood and Affect: Mood normal.        Thought Content: Thought content normal.   Review of Systems  Constitutional: Negative.   HENT: Negative.    Eyes: Negative.   Respiratory: Negative.    Cardiovascular: Negative.   Gastrointestinal: Negative.   Musculoskeletal: Negative.   Skin: Negative.   Neurological: Negative.   Psychiatric/Behavioral: Negative.    Blood pressure (!) 85/66, pulse 79, temperature 98.4 F (36.9 C), temperature source Oral, resp. rate 18, height 5' 3" (1.6 m), weight 44 kg, SpO2 100 %. Body mass index is 17.18 kg/m.   Social History   Tobacco Use  Smoking Status Every  Day   Packs/day: 1.00   Years: 9.00   Pack years: 9.00   Types: E-cigarettes, Cigarettes  Smokeless Tobacco Never   Tobacco Cessation:  A prescription for an FDA-approved tobacco cessation medication provided at discharge   Blood Alcohol level:  Lab Results  Component Value Date   ETH <10 12/24/2021   ETH <10 41/28/7867    Metabolic Disorder Labs:  Lab Results  Component Value Date   HGBA1C 4.9 12/25/2021   MPG 93.93 12/25/2021   MPG 114 06/02/2021   No results found for: PROLACTIN Lab Results  Component Value Date   CHOL 160 12/25/2021   TRIG 95 12/25/2021   HDL 58 12/25/2021   CHOLHDL 2.8 12/25/2021   VLDL 19 12/25/2021   LDLCALC 83 12/25/2021   LDLCALC 63 06/01/2021    See Psychiatric Specialty Exam and Suicide Risk Assessment completed by Attending Physician prior to discharge.  Discharge destination:  Home  Is patient on multiple antipsychotic therapies at discharge:  Yes,   Do you recommend tapering to monotherapy for antipsychotics?  No   Has Patient had three or more failed trials of antipsychotic monotherapy by history:  Yes,   Antipsychotic medications that previously  failed include:   1.  Abilify., 2.  Seroquel., and 3.  Haldol.  Recommended Plan for Multiple Antipsychotic Therapies: NA  Discharge Instructions     Diet - low sodium heart healthy   Complete by: As directed    Increase activity slowly   Complete by: As directed       Allergies as of 01/08/2022       Reactions   Haldol [haloperidol Lactate]    Patient states that she gets stiff muscles when taking Haldol   Ultram [tramadol] Other (See Comments)   Seizures   Ziprasidone Hcl    Other reaction(s): Other (See Comments) PER PT WHEN SHE TAKES THIS SHE CANT MOVE HER NECK        Medication List     STOP taking these medications    traZODone 50 MG tablet Commonly known as: DESYREL       TAKE these medications      Indication  ARIPiprazole 30 MG tablet Commonly known as: ABILIFY Take 1 tablet (30 mg total) by mouth daily. What changed:  medication strength how much to take  Indication: Schizophrenia   divalproex 250 MG DR tablet Commonly known as: DEPAKOTE Take 3 tablets (750 mg total) by mouth every 12 (twelve) hours. What changed:  how much to take when to take this Another medication with the same name was removed. Continue taking this medication, and follow the directions you see here.  Indication: Manic-Depression, Schizophrenia   hydrOXYzine 50 MG tablet Commonly known as: ATARAX Take 1 tablet (50 mg total) by mouth every 6 (six) hours as needed for anxiety. What changed:  medication strength how much to take when to take this reasons to take this  Indication: Feeling Anxious   lithium carbonate 300 MG CR tablet Commonly known as: LITHOBID Take 2 tablets (600 mg total) by mouth every 12 (twelve) hours.  Indication: Manic-Depression, Schizoaffective Disorder   nicotine 21 mg/24hr patch Commonly known as: NICODERM CQ - dosed in mg/24 hours Place 1 patch (21 mg total) onto the skin daily at 6 (six) AM.  Indication: Nicotine Addiction   QUEtiapine  Fumarate 150 MG Tabs Take 150 mg by mouth at bedtime.  Indication: Manic-Depression, Schizophrenia        Follow-up Information  [facility] on 01/11/2022.   Why: Lanae Boast, peer support, will pick you up for your appointment on Monday 01/11/22 at 0715AM. Contact information: [place] [address] [phone]                 Follow-up recommendations: Follow up with [facility] for substance abuse and mental health treatment.  Continue current medicine  Comments: Prescriptions and 10-day supply provided at discharge  Signed: Alethia Berthold, MD 01/08/2022, 10:26 AM

## 2022-01-19 ENCOUNTER — Encounter: Payer: Self-pay | Admitting: Emergency Medicine

## 2022-01-19 ENCOUNTER — Other Ambulatory Visit: Payer: Self-pay

## 2022-01-19 ENCOUNTER — Emergency Department
Admission: EM | Admit: 2022-01-19 | Discharge: 2022-01-20 | Disposition: A | Discharge status: Other Acute Inpatient Hospital | Payer: 59 | Attending: Emergency Medicine | Admitting: Emergency Medicine

## 2022-01-19 DIAGNOSIS — F1910 Other psychoactive substance abuse, uncomplicated: Secondary | ICD-10-CM | POA: Insufficient documentation

## 2022-01-19 DIAGNOSIS — F1520 Other stimulant dependence, uncomplicated: Secondary | ICD-10-CM | POA: Insufficient documentation

## 2022-01-19 DIAGNOSIS — F312 Bipolar disorder, current episode manic severe with psychotic features: Secondary | ICD-10-CM | POA: Diagnosis not present

## 2022-01-19 DIAGNOSIS — F1510 Other stimulant abuse, uncomplicated: Secondary | ICD-10-CM | POA: Diagnosis present

## 2022-01-19 DIAGNOSIS — F1220 Cannabis dependence, uncomplicated: Secondary | ICD-10-CM | POA: Insufficient documentation

## 2022-01-19 DIAGNOSIS — R451 Restlessness and agitation: Secondary | ICD-10-CM

## 2022-01-19 DIAGNOSIS — F3110 Bipolar disorder, current episode manic without psychotic features, unspecified: Secondary | ICD-10-CM | POA: Diagnosis present

## 2022-01-19 DIAGNOSIS — Z20822 Contact with and (suspected) exposure to covid-19: Secondary | ICD-10-CM | POA: Insufficient documentation

## 2022-01-19 DIAGNOSIS — F319 Bipolar disorder, unspecified: Secondary | ICD-10-CM | POA: Insufficient documentation

## 2022-01-19 DIAGNOSIS — F1010 Alcohol abuse, uncomplicated: Secondary | ICD-10-CM | POA: Diagnosis present

## 2022-01-19 DIAGNOSIS — Y900 Blood alcohol level of less than 20 mg/100 ml: Secondary | ICD-10-CM | POA: Insufficient documentation

## 2022-01-19 LAB — URINE DRUG SCREEN, QUALITATIVE (ARMC ONLY)
Amphetamines, Ur Screen: NOT DETECTED
Barbiturates, Ur Screen: NOT DETECTED
Benzodiazepine, Ur Scrn: NOT DETECTED
Cannabinoid 50 Ng, Ur ~~LOC~~: NOT DETECTED
Cocaine Metabolite,Ur ~~LOC~~: NOT DETECTED
MDMA (Ecstasy)Ur Screen: NOT DETECTED
Methadone Scn, Ur: NOT DETECTED
Opiate, Ur Screen: NOT DETECTED
Phencyclidine (PCP) Ur S: NOT DETECTED
Tricyclic, Ur Screen: NOT DETECTED

## 2022-01-19 LAB — CBC
HCT: 39.1 % (ref 36.0–46.0)
Hemoglobin: 12.6 g/dL (ref 12.0–15.0)
MCH: 30.3 pg (ref 26.0–34.0)
MCHC: 32.2 g/dL (ref 30.0–36.0)
MCV: 94 fL (ref 80.0–100.0)
Platelets: 401 10*3/uL — ABNORMAL HIGH (ref 150–400)
RBC: 4.16 MIL/uL (ref 3.87–5.11)
RDW: 13.2 % (ref 11.5–15.5)
WBC: 13.1 10*3/uL — ABNORMAL HIGH (ref 4.0–10.5)
nRBC: 0 % (ref 0.0–0.2)

## 2022-01-19 LAB — COMPREHENSIVE METABOLIC PANEL
ALT: 17 U/L (ref 0–44)
AST: 10 U/L — ABNORMAL LOW (ref 15–41)
Albumin: 4 g/dL (ref 3.5–5.0)
Alkaline Phosphatase: 59 U/L (ref 38–126)
Anion gap: 8 (ref 5–15)
BUN: 13 mg/dL (ref 6–20)
CO2: 23 mmol/L (ref 22–32)
Calcium: 9 mg/dL (ref 8.9–10.3)
Chloride: 104 mmol/L (ref 98–111)
Creatinine, Ser: 0.54 mg/dL (ref 0.44–1.00)
GFR, Estimated: >60 mL/min (ref >60)
Glucose, Bld: 131 mg/dL — ABNORMAL HIGH (ref 70–99)
Potassium: 3.5 mmol/L (ref 3.5–5.1)
Sodium: 135 mmol/L (ref 135–145)
Total Bilirubin: 0.6 mg/dL (ref 0.3–1.2)
Total Protein: 7 g/dL (ref 6.5–8.1)

## 2022-01-19 LAB — ETHANOL: Alcohol, Ethyl (B): <10 mg/dL (ref <10)

## 2022-01-19 LAB — RESP PANEL BY RT-PCR (FLU A&B, COVID) ARPGX2
Influenza A by PCR: NEGATIVE
Influenza B by PCR: NEGATIVE
SARS Coronavirus 2 by RT PCR: NEGATIVE

## 2022-01-19 LAB — POC URINE PREG, ED: Preg Test, Ur: NEGATIVE

## 2022-01-19 MED ORDER — DIPHENHYDRAMINE HCL 50 MG/ML IJ SOLN: 50.0000 mg | Freq: Once | INTRAMUSCULAR | Status: DC

## 2022-01-19 MED ORDER — DIPHENHYDRAMINE HCL 50 MG/ML IJ SOLN: 50.0000 mg | Freq: Once | INTRAMUSCULAR | Status: AC

## 2022-01-19 MED ORDER — LORAZEPAM 2 MG/ML IJ SOLN
2.0000 mg | Freq: Once | INTRAMUSCULAR | Status: AC
  Administered 2022-01-19: 2 mg via INTRAMUSCULAR

## 2022-01-19 MED ORDER — LORAZEPAM 2 MG/ML IJ SOLN
INTRAMUSCULAR | Status: AC
  Filled 2022-01-19: qty 1

## 2022-01-19 MED ORDER — DIPHENHYDRAMINE HCL 50 MG/ML IJ SOLN
INTRAMUSCULAR | Status: AC
  Administered 2022-01-19: 50 mg via INTRAMUSCULAR
  Filled 2022-01-19: qty 1

## 2022-01-19 MED ORDER — DIVALPROEX SODIUM 500 MG PO DR TAB
750.0000 mg | DELAYED_RELEASE_TABLET | Freq: Two times a day (BID) | ORAL | Status: DC
  Administered 2022-01-19 – 2022-01-20 (×2): 750 mg via ORAL
  Filled 2022-01-19 (×2): qty 1

## 2022-01-19 MED ORDER — QUETIAPINE FUMARATE 25 MG PO TABS
150.0000 mg | ORAL_TABLET | Freq: Every day | ORAL | Status: DC
  Administered 2022-01-19: 150 mg via ORAL
  Filled 2022-01-19: qty 6

## 2022-01-19 MED ORDER — NICOTINE 21 MG/24HR TD PT24
21.0000 mg | MEDICATED_PATCH | Freq: Every day | TRANSDERMAL | Status: DC
  Administered 2022-01-19 – 2022-01-20 (×2): 21 mg via TRANSDERMAL
  Filled 2022-01-19 (×2): qty 1

## 2022-01-19 MED ORDER — DIPHENHYDRAMINE HCL 50 MG/ML IJ SOLN: 25.0000 mg | Freq: Once | INTRAMUSCULAR | Status: DC

## 2022-01-19 MED ORDER — ARIPIPRAZOLE 15 MG PO TABS
30.0000 mg | ORAL_TABLET | Freq: Every day | ORAL | Status: DC
  Administered 2022-01-19 – 2022-01-20 (×2): 30 mg via ORAL
  Filled 2022-01-19 (×2): qty 2

## 2022-01-19 MED ORDER — LITHIUM CARBONATE ER 300 MG PO TBCR
600.0000 mg | EXTENDED_RELEASE_TABLET | Freq: Two times a day (BID) | ORAL | Status: DC
  Administered 2022-01-19 – 2022-01-20 (×2): 600 mg via ORAL
  Filled 2022-01-19 (×3): qty 2

## 2022-01-19 NOTE — Consult Note (Signed)
[facility] Face-to-Face Psychiatry Consult   Reason for Consult: Consult for this 46 year old woman with bipolar disorder and substance abuse well known to the psychiatric service Referring Physician: Paduchowski Patient Identification: Autumn [name] MRN:  [id number] Principal Diagnosis: Bipolar affective disorder, current episode manic (HCC) Diagnosis:  Principal Problem:   Bipolar affective disorder, current episode manic (HCC) Active Problems:   Amphetamine use disorder, severe (HCC)   Cannabis use disorder, severe, dependence (HCC)   Alcohol abuse   Total Time spent with patient: 45 minutes  Subjective:   Autumn [name] is a 46 y.o. female patient admitted with "I did not do what I should have".  HPI: Patient seen and chart reviewed.  Patient was just discharged from the hospital recently.  After leaving she did not remain compliant with her medicine but went back to living in a drug house.  Says that she relapsed on to drug use although her current drug screen is negative.  Came into the emergency room confused agitated almost delirious with psychosis again.  Has required some injections for safety since coming in.  This afternoon was calm enough to have a brief conversation.  Basically oriented.  Expressed regret at being back.  Still having some hallucinations.  Past Psychiatric History: Past history of bipolar disorder or schizoaffective disorder with chronic substance abuse problems  Risk to Self:   Risk to Others:   Prior Inpatient Therapy:   Prior Outpatient Therapy:    Past Medical History:  Past Medical History:  Diagnosis Date   Allergy    Seasonal, Ultram (seizures)   Anemia 2005   Bipolar 1 disorder (HCC)    Glaucoma    Seizures (HCC) 2008    Past Surgical History:  Procedure Laterality Date   BREAST EXCISIONAL BIOPSY Right    x 2   CESAREAN SECTION     x 2   LEEP  2000   Family History:  Family History  Problem Relation Age of Onset   Hypertension Mother     Hyperlipidemia Mother    Asthma Mother    Parkinson's disease Father    Lung cancer Maternal Grandmother    Other Paternal Grandfather 45       Cardiac arrest   Heart disease Paternal Grandfather    Tourette syndrome Daughter    Family Psychiatric  History: See previous Social History:  Social History   Substance and Sexual Activity  Alcohol Use Yes   Alcohol/week: 4.0 standard drinks   Types: 4 Cans of beer per week   Comment: Ocassional     Social History   Substance and Sexual Activity  Drug Use Yes   Types: Marijuana   Comment: percocet -last use over 1 week ago. Heroin-last used 2/11    Social History   Socioeconomic History   Marital status: Legally Separated    Spouse name: Not on file   Number of children: Not on file   Years of education: Not on file   Highest education level: Not on file  Occupational History   Not on file  Tobacco Use   Smoking status: Every Day    Packs/day: 1.00    Years: 9.00    Pack years: 9.00    Types: E-cigarettes, Cigarettes   Smokeless tobacco: Never  Vaping Use   Vaping Use: Never used  Substance and Sexual Activity   Alcohol use: Yes    Alcohol/week: 4.0 standard drinks    Types: 4 Cans of beer per week    Comment: Ocassional  Drug use: Yes    Types: Marijuana    Comment: percocet -last use over 1 week ago. Heroin-last used 2/11   Sexual activity: Yes    Birth control/protection: None  Other Topics Concern   Not on file  Social History Narrative   Not on file   Social Determinants of Health   Financial Resource Strain: Not on file  Food Insecurity: Not on file  Transportation Needs: Not on file  Physical Activity: Not on file  Stress: Not on file  Social Connections: Not on file   Additional Social History:    Allergies:   Allergies  Allergen Reactions   Haldol [Haloperidol Lactate]     Patient states that she gets stiff muscles when taking Haldol   Ultram [Tramadol] Other (See Comments)     Seizures   Ziprasidone Hcl     Other reaction(s): Other (See Comments) PER PT WHEN SHE TAKES THIS SHE CANT MOVE HER NECK    Labs:  Results for orders placed or performed during the hospital encounter of 01/19/22 (from the past 48 hour(s))  Comprehensive metabolic panel     Status: Abnormal   Collection Time: 01/19/22 10:42 AM  Result Value Ref Range   Sodium 135 135 - 145 mmol/L   Potassium 3.5 3.5 - 5.1 mmol/L   Chloride 104 98 - 111 mmol/L   CO2 23 22 - 32 mmol/L   Glucose, Bld 131 (H) 70 - 99 mg/dL    Comment: Glucose reference range applies only to samples taken after fasting for at least 8 hours.   BUN 13 6 - 20 mg/dL   Creatinine, Ser 9.56 0.44 - 1.00 mg/dL   Calcium 9.0 8.9 - 21.3 mg/dL   Total Protein 7.0 6.5 - 8.1 g/dL   Albumin 4.0 3.5 - 5.0 g/dL   AST 10 (L) 15 - 41 U/L   ALT 17 0 - 44 U/L   Alkaline Phosphatase 59 38 - 126 U/L   Total Bilirubin 0.6 0.3 - 1.2 mg/dL   GFR, Estimated >08 >65 mL/min    Comment: (NOTE) Calculated using the CKD-EPI Creatinine Equation (2021)    Anion gap 8 5 - 15    Comment: Performed at [facility], [address] [place], [place] [address]  Ethanol     Status: None   Collection Time: 01/19/22 10:42 AM  Result Value Ref Range   Alcohol, Ethyl (B) <10 <10 mg/dL    Comment: (NOTE) Lowest detectable limit for serum alcohol is 10 mg/dL.  For medical purposes only. Performed at [facility], [address] [place], [place] [address]   cbc     Status: Abnormal   Collection Time: 01/19/22 10:42 AM  Result Value Ref Range   WBC 13.1 (H) 4.0 - 10.5 K/uL   RBC 4.16 3.87 - 5.11 MIL/uL   Hemoglobin 12.6 12.0 - 15.0 g/dL   HCT 84.1 32.4 - 40.1 %   MCV 94.0 80.0 - 100.0 fL   MCH 30.3 26.0 - 34.0 pg   MCHC 32.2 30.0 - 36.0 g/dL   RDW 02.7 25.3 - 66.4 %   Platelets 401 (H) 150 - 400 K/uL   nRBC 0.0 0.0 - 0.2 %    Comment: Performed at [facility], [address] [place], [place] [address]  Resp Panel  by RT-PCR (Flu A&B, Covid)     Status: None   Collection Time: 01/19/22 10:44 AM   Specimen: Nasopharyngeal(NP) swabs in vial transport medium  Result Value Ref Range   SARS Coronavirus 2 by RT PCR NEGATIVE NEGATIVE    Comment: (NOTE) SARS-CoV-2 target nucleic acids are NOT DETECTED.  The SARS-CoV-2 RNA is generally detectable in upper respiratory specimens during the acute phase of infection. The lowest concentration of SARS-CoV-2 viral copies this assay can detect is 138 copies/mL. A negative result does not preclude SARS-Cov-2 infection and should not be used as the sole basis for treatment or other patient management decisions. A negative result may occur with  improper specimen collection/handling, submission of specimen other than nasopharyngeal swab, presence of viral mutation(s) within the areas targeted by this assay, and inadequate number of viral copies(<138 copies/mL). A negative result must be combined with clinical observations, patient history, and epidemiological information. The expected result is Negative.  Fact Sheet for Patients:  BloggerCourse.com  Fact Sheet for Healthcare Providers:  SeriousBroker.it  This test is no t yet approved or cleared by the [place] FDA and  has been authorized for detection and/or diagnosis of SARS-CoV-2 by FDA under an Emergency Use Authorization (EUA). This EUA will remain  in effect (meaning this test can be used) for the duration of the COVID-19 declaration under Section 564(b)(1) of the Act, 21 U.S.C.section 360bbb-3(b)(1), unless the authorization is terminated  or revoked sooner.       Influenza A by PCR NEGATIVE NEGATIVE   Influenza B by PCR NEGATIVE NEGATIVE    Comment: (NOTE) The Xpert Xpress SARS-CoV-2/FLU/RSV plus assay is intended as an aid in the diagnosis of influenza from Nasopharyngeal swab specimens and should not be used as a sole basis for treatment.  Nasal washings and aspirates are unacceptable for Xpert Xpress SARS-CoV-2/FLU/RSV testing.  Fact Sheet for Patients: BloggerCourse.com  Fact Sheet for Healthcare Providers: SeriousBroker.it  This test is not yet approved or cleared by the [place] FDA and has been authorized for detection and/or diagnosis of SARS-CoV-2 by FDA under an Emergency Use Authorization (EUA). This EUA will remain in effect (meaning this test can be used) for the duration of the COVID-19 declaration under Section 564(b)(1) of the Act, 21 U.S.C. section 360bbb-3(b)(1), unless the authorization is terminated or revoked.  Performed at [facility], [address] [place], [place] [address]   Urine Drug Screen, Qualitative     Status: None   Collection Time: 01/19/22 10:59 AM  Result Value Ref Range   Tricyclic, Ur Screen NONE DETECTED NONE DETECTED   Amphetamines, Ur Screen NONE DETECTED NONE DETECTED   MDMA (Ecstasy)Ur Screen NONE DETECTED NONE DETECTED   Cocaine Metabolite,Ur [place] NONE DETECTED NONE DETECTED   Opiate, Ur Screen NONE DETECTED NONE DETECTED   Phencyclidine (PCP) Ur S NONE DETECTED NONE DETECTED   Cannabinoid 50 Ng, Ur [place] NONE DETECTED NONE DETECTED   Barbiturates, Ur Screen NONE DETECTED NONE DETECTED   Benzodiazepine, Ur Scrn NONE DETECTED NONE DETECTED   Methadone Scn, Ur NONE DETECTED NONE DETECTED    Comment: (NOTE) Tricyclics + metabolites, urine    Cutoff 1000 ng/mL Amphetamines + metabolites, urine  Cutoff 1000 ng/mL MDMA (Ecstasy), urine              Cutoff 500 ng/mL Cocaine Metabolite, urine          Cutoff 300 ng/mL Opiate + metabolites, urine        Cutoff 300 ng/mL Phencyclidine (PCP), urine         Cutoff 25 ng/mL Cannabinoid, urine  Cutoff 50 ng/mL Barbiturates + metabolites, urine  Cutoff 200 ng/mL Benzodiazepine, urine              Cutoff 200 ng/mL Methadone, urine                   Cutoff  300 ng/mL  The urine drug screen provides only a preliminary, unconfirmed analytical test result and should not be used for non-medical purposes. Clinical consideration and professional judgment should be applied to any positive drug screen result due to possible interfering substances. A more specific alternate chemical method must be used in order to obtain a confirmed analytical result. Gas chromatography / mass spectrometry (GC/MS) is the preferred confirm atory method. Performed at [facility], [address] [place], [place] [address]   POC urine preg, ED     Status: None   Collection Time: 01/19/22 11:05 AM  Result Value Ref Range   Preg Test, Ur NEGATIVE NEGATIVE    Comment:        THE SENSITIVITY OF THIS METHODOLOGY IS >24 mIU/mL     Current Facility-Administered Medications  Medication Dose Route Frequency Provider Last Rate Last Admin   ARIPiprazole (ABILIFY) tablet 30 mg  30 mg Oral Daily Darcel Frane, Jackquline Denmark, MD       diphenhydrAMINE (BENADRYL) injection 50 mg  50 mg Intravenous Once Fisher, Susan W, PA-C       divalproex (DEPAKOTE) DR tablet 750 mg  750 mg Oral Q12H Warnell Rasnic, Jackquline Denmark, MD       lithium carbonate (LITHOBID) CR tablet 600 mg  600 mg Oral Q12H Desiree Fleming T, MD       nicotine (NICODERM CQ - dosed in mg/24 hours) patch 21 mg  21 mg Transdermal Daily Phineas Semen, MD   21 mg at 01/19/22 1121   QUEtiapine (SEROQUEL) tablet 150 mg  150 mg Oral QHS Nylah Butkus, Jackquline Denmark, MD       Current Outpatient Medications  Medication Sig Dispense Refill   ARIPiprazole (ABILIFY) 30 MG tablet Take 1 tablet (30 mg total) by mouth daily. 30 tablet 1   divalproex (DEPAKOTE) 250 MG DR tablet Take 3 tablets (750 mg total) by mouth every 12 (twelve) hours. 180 tablet 1   hydrOXYzine (ATARAX) 50 MG tablet Take 1 tablet (50 mg total) by mouth every 6 (six) hours as needed for anxiety. 60 tablet 1   lithium carbonate (LITHOBID) 300 MG CR tablet Take 2 tablets (600 mg total)  by mouth every 12 (twelve) hours. 120 tablet 1   nicotine (NICODERM CQ - DOSED IN MG/24 HOURS) 21 mg/24hr patch Place 1 patch (21 mg total) onto the skin daily at 6 (six) AM. 28 patch 1   QUEtiapine 150 MG TABS Take 150 mg by mouth at bedtime. 30 tablet 1    Musculoskeletal: Strength & Muscle Tone: within normal limits Gait & Station: normal Patient leans: N/A            Psychiatric Specialty Exam:  Presentation  General Appearance: Bizarre; Disheveled  Eye Contact:Good  Speech:Pressured; Slurred  Speech Volume:Decreased  Handedness:Right   Mood and Affect  Mood:Euphoric  Affect:Inappropriate; Full Range   Thought Process  Thought Processes:Disorganized  Descriptions of Associations:Tangential  Orientation:Partial  Thought Content:Obsessions; Paranoid Ideation; Illogical  History of Schizophrenia/Schizoaffective disorder:Yes  Duration of Psychotic Symptoms:Greater than six months  Hallucinations:No data recorded Ideas of Reference:Delusions  Suicidal Thoughts:No data recorded Homicidal Thoughts:No data recorded  Sensorium  Memory:Immediate Poor; Recent Poor; Remote Poor  Judgment:Poor  Insight:Poor   Executive Functions  Concentration:Poor  Attention Span:Poor  Recall:Poor  [employer] of Knowledge:Poor  Language:Poor   Psychomotor Activity  Psychomotor Activity:No data recorded  Assets  Assets:Communication Skills; Desire for Improvement; Resilience; Social Support   Sleep  Sleep:No data recorded  Physical Exam: Physical Exam Vitals and nursing note reviewed.  Constitutional:      Appearance: Normal appearance.  HENT:     Head: Normocephalic and atraumatic.     Mouth/Throat:     Pharynx: Oropharynx is clear.  Eyes:     Pupils: Pupils are equal, round, and reactive to light.  Cardiovascular:     Rate and Rhythm: Normal rate and regular rhythm.  Pulmonary:     Effort: Pulmonary effort is normal.     Breath sounds: Normal  breath sounds.  Abdominal:     General: Abdomen is flat.     Palpations: Abdomen is soft.  Musculoskeletal:        General: Normal range of motion.  Skin:    General: Skin is warm and dry.  Neurological:     General: No focal deficit present.     Mental Status: She is alert. Mental status is at baseline.  Psychiatric:        Attention and Perception: She is inattentive.        Mood and Affect: Mood normal. Affect is blunt.        Speech: Speech is delayed.        Behavior: Behavior is withdrawn.        Thought Content: Thought content is paranoid.        Cognition and Memory: Cognition is impaired. Memory is impaired.   Review of Systems  Constitutional: Negative.   HENT: Negative.    Eyes: Negative.   Respiratory: Negative.    Cardiovascular: Negative.   Gastrointestinal: Negative.   Musculoskeletal: Negative.   Skin: Negative.   Neurological: Negative.   Psychiatric/Behavioral:  Positive for hallucinations and substance abuse.   Blood pressure 138/90, pulse (!) 108, temperature 98.6 F (37 C), temperature source Oral, resp. rate 20, height 5\' 1"  (1.549 m), weight 44 kg, SpO2 96 %. Body mass index is 18.33 kg/m.  Treatment Plan Summary: Plan patient will be started back on lithium and Depakote Abilify and Seroquel as she was when she left the hospital.  Plan for likely admission to psychiatric unit once she has calmed down.  Disposition: Recommend psychiatric Inpatient admission when medically cleared.  Mordecai Rasmussen, MD 01/19/2022 6:09 PM
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
## 2022-01-19 NOTE — ED Notes (Signed)
VOLUNTARY awaiting PSYCH consult ?

## 2022-01-19 NOTE — ED Notes (Signed)
Dinner tray given
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
## 2022-01-19 NOTE — ED Notes (Signed)
Report received from [place], [place]. Patient currently sleeping, respirations regular and unlabored. Q15 minute rounds and observation by Engineer, drilling to continue. Will assess patient once awake.  ?

## 2022-01-19 NOTE — ED Notes (Signed)
Pt was brought in by 'caregiver' Thompson Grayer who is a Psychologist, occupational at the [facility]. He left contact info for Pt as follows:  Thompson Grayer [phone] ?

## 2022-01-19 NOTE — ED Provider Notes (Signed)
? ?[facility] ?Provider Note ? ? ? Event Date/Time  ? First MD Initiated Contact with Patient 01/19/22 1105   ?  (approximate) ? ? ?History  ? ?Psychiatric Evaluation ? ? ?HPI ? ?Autumn [name] is a 46 y.o. female  who, per discharge summary dated 01/08/22 had an admission for psychotic symptoms and has history of bipolar with manic episode, who presents to the emergency department today because of concern for psychiatric illness.  Patient states she has not been on her medications for the past week.  She states that she has been abusing recreational drugs.  History is somewhat limited given patient's agitation. ? ?  ? ? ?Physical Exam  ? ?Triage Vital Signs: ?ED Triage Vitals  ?Enc Vitals Group  ?   BP 01/19/22 1033 138/90  ?   Pulse Rate 01/19/22 1033 (!) 108  ?   Resp 01/19/22 1033 20  ?   Temp 01/19/22 1033 98.6 ?F (37 ?C)  ?   Temp Source 01/19/22 1033 Oral  ?   SpO2 01/19/22 1033 96 %  ?   Weight 01/19/22 1034 97 lb (44 kg)  ?   Height 01/19/22 1034 '5\' 1"'$  (1.549 m)  ?   Head Circumference --   ?   Peak Flow --   ?   Pain Score 01/19/22 1034 5  ?   Pain Loc --   ?   Pain Edu? --   ?   Excl. in [place]? --   ? ? ?Most recent vital signs: ?Vitals:  ? 01/19/22 1033  ?BP: 138/90  ?Pulse: (!) 108  ?Resp: 20  ?Temp: 98.6 ?F (37 ?C)  ?SpO2: 96%  ? ?General: Awake, agitated. ?CV:  Good peripheral perfusion.  ?Resp:  Normal effort.  ?Abd:  No distention.  ?Psych:  Agitated. Pressured speech. Labile.  ? ? ?ED Results / Procedures / Treatments  ? ?Labs ?(all labs ordered are listed, but only abnormal results are displayed) ?Labs Reviewed  ?COMPREHENSIVE METABOLIC PANEL - Abnormal; Notable for the following components:  ?    Result Value  ? Glucose, Bld 131 (*)   ? AST 10 (*)   ? All other components within normal limits  ?CBC - Abnormal; Notable for the following components:  ? WBC 13.1 (*)   ? Platelets 401 (*)   ? All other components within normal limits  ?RESP PANEL BY RT-PCR (FLU A&B, COVID) [id number]   ?ETHANOL  ?URINE DRUG SCREEN, QUALITATIVE ([facility] ONLY)  ?POC URINE PREG, ED  ? ? ? ?EKG ? ?None ? ? ?RADIOLOGY ?None ? ?PROCEDURES: ? ?Critical Care performed: No ? ?Procedures ? ? ?MEDICATIONS ORDERED IN ED: ?Medications - No data to display ? ? ?IMPRESSION / MDM / ASSESSMENT AND PLAN / ED COURSE  ?I reviewed the triage vital signs and the nursing notes. ?             ?               ? ?Differential diagnosis includes, but is not limited to, psychiatric illness, medication noncompliance, drug use. ? ?Patient presented to the emergency department today because of concerns for abnormal behavior.  Patient does admit to being off her psychiatric medications for the past week and using drugs.  Patient is quite agitated on my exam.  She was starting to hit herself.  Given concern for safety of patient and staff she was given medication to help calm her down. Will have psychiatry evaluate. ? ?The  patient has been placed in psychiatric observation due to the need to provide a safe environment for the patient while obtaining psychiatric consultation and evaluation, as well as ongoing medical and medication management to treat the patient's condition.  The patient has not been placed under full IVC at this time. ? ? ?FINAL CLINICAL IMPRESSION(S) / ED DIAGNOSES  ? ?Final diagnoses:  ?Agitation  ? ? ? ?Note:  This document was prepared using Dragon voice recognition software and may include unintentional dictation errors. ? ?  ?Nance Pear, MD ?01/19/22 1242 ? ?
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
## 2022-01-20 ENCOUNTER — Other Ambulatory Visit: Payer: Self-pay

## 2022-01-20 ENCOUNTER — Encounter: Payer: Self-pay | Admitting: Psychiatry

## 2022-01-20 ENCOUNTER — Inpatient Hospital Stay
Admission: AD | Admit: 2022-01-20 | Discharge: 2022-02-05 | DRG: 885 | Disposition: A | Discharge status: Home / Self Care | Payer: 59 | Source: Intra-hospital | Attending: Psychiatry | Admitting: Psychiatry

## 2022-01-20 DIAGNOSIS — F1520 Other stimulant dependence, uncomplicated: Secondary | ICD-10-CM | POA: Diagnosis present

## 2022-01-20 DIAGNOSIS — F1220 Cannabis dependence, uncomplicated: Secondary | ICD-10-CM | POA: Diagnosis present

## 2022-01-20 DIAGNOSIS — F1410 Cocaine abuse, uncomplicated: Secondary | ICD-10-CM | POA: Diagnosis present

## 2022-01-20 DIAGNOSIS — Z885 Allergy status to narcotic agent status: Secondary | ICD-10-CM

## 2022-01-20 DIAGNOSIS — F319 Bipolar disorder, unspecified: Secondary | ICD-10-CM | POA: Diagnosis present

## 2022-01-20 DIAGNOSIS — F17290 Nicotine dependence, other tobacco product, uncomplicated: Secondary | ICD-10-CM | POA: Diagnosis present

## 2022-01-20 DIAGNOSIS — F419 Anxiety disorder, unspecified: Secondary | ICD-10-CM | POA: Diagnosis present

## 2022-01-20 DIAGNOSIS — F3160 Bipolar disorder, current episode mixed, unspecified: Secondary | ICD-10-CM | POA: Diagnosis present

## 2022-01-20 DIAGNOSIS — Z635 Disruption of family by separation and divorce: Secondary | ICD-10-CM | POA: Diagnosis not present

## 2022-01-20 DIAGNOSIS — Z888 Allergy status to other drugs, medicaments and biological substances status: Secondary | ICD-10-CM | POA: Diagnosis not present

## 2022-01-20 DIAGNOSIS — F259 Schizoaffective disorder, unspecified: Secondary | ICD-10-CM | POA: Diagnosis present

## 2022-01-20 DIAGNOSIS — F1110 Opioid abuse, uncomplicated: Secondary | ICD-10-CM | POA: Diagnosis present

## 2022-01-20 DIAGNOSIS — A599 Trichomoniasis, unspecified: Secondary | ICD-10-CM | POA: Diagnosis present

## 2022-01-20 DIAGNOSIS — Z5900 Homelessness unspecified: Secondary | ICD-10-CM | POA: Diagnosis not present

## 2022-01-20 DIAGNOSIS — Z79899 Other long term (current) drug therapy: Secondary | ICD-10-CM | POA: Diagnosis not present

## 2022-01-20 DIAGNOSIS — H409 Unspecified glaucoma: Secondary | ICD-10-CM | POA: Diagnosis present

## 2022-01-20 DIAGNOSIS — F1120 Opioid dependence, uncomplicated: Secondary | ICD-10-CM | POA: Diagnosis present

## 2022-01-20 DIAGNOSIS — Z818 Family history of other mental and behavioral disorders: Secondary | ICD-10-CM | POA: Diagnosis not present

## 2022-01-20 DIAGNOSIS — F17210 Nicotine dependence, cigarettes, uncomplicated: Secondary | ICD-10-CM | POA: Diagnosis present

## 2022-01-20 DIAGNOSIS — F1510 Other stimulant abuse, uncomplicated: Secondary | ICD-10-CM | POA: Diagnosis present

## 2022-01-20 DIAGNOSIS — R45851 Suicidal ideations: Secondary | ICD-10-CM | POA: Diagnosis present

## 2022-01-20 MED ORDER — DIVALPROEX SODIUM 500 MG PO DR TAB
750.0000 mg | DELAYED_RELEASE_TABLET | Freq: Two times a day (BID) | ORAL | Status: DC
  Administered 2022-01-20 – 2022-02-05 (×32): 750 mg via ORAL
  Filled 2022-01-20 (×32): qty 1

## 2022-01-20 MED ORDER — ALUM & MAG HYDROXIDE-SIMETH 200-200-20 MG/5ML PO SUSP: 30.0000 mL | ORAL | Status: DC | PRN

## 2022-01-20 MED ORDER — LITHIUM CARBONATE ER 300 MG PO TBCR
600.0000 mg | EXTENDED_RELEASE_TABLET | Freq: Two times a day (BID) | ORAL | Status: DC
  Administered 2022-01-20 – 2022-02-05 (×32): 600 mg via ORAL
  Filled 2022-01-20 (×32): qty 2

## 2022-01-20 MED ORDER — QUETIAPINE FUMARATE 100 MG PO TABS
150.0000 mg | ORAL_TABLET | Freq: Every day | ORAL | Status: DC
  Administered 2022-01-20: 150 mg via ORAL
  Filled 2022-01-20: qty 2

## 2022-01-20 MED ORDER — NICOTINE 21 MG/24HR TD PT24
21.0000 mg | MEDICATED_PATCH | Freq: Every day | TRANSDERMAL | Status: DC
  Administered 2022-01-21 – 2022-02-05 (×16): 21 mg via TRANSDERMAL
  Filled 2022-01-20 (×16): qty 1

## 2022-01-20 MED ORDER — ACETAMINOPHEN 325 MG PO TABS
650.0000 mg | ORAL_TABLET | Freq: Four times a day (QID) | ORAL | Status: DC | PRN
  Administered 2022-01-21 – 2022-02-01 (×16): 650 mg via ORAL
  Filled 2022-01-20 (×16): qty 2

## 2022-01-20 MED ORDER — MAGNESIUM HYDROXIDE 400 MG/5ML PO SUSP: 30.0000 mL | Freq: Every day | ORAL | Status: DC | PRN

## 2022-01-20 MED ORDER — ARIPIPRAZOLE 10 MG PO TABS
30.0000 mg | ORAL_TABLET | Freq: Every day | ORAL | Status: DC
Start: 2022-01-21 — End: 2022-01-21
  Administered 2022-01-21: 30 mg via ORAL
  Filled 2022-01-20: qty 3
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
## 2022-01-20 NOTE — BH Assessment (Signed)
Patient is to be admitted to [facility] by Dr. Weber Cooks.  ?Attending Physician will be Dr.  Weber Cooks .   ?Patient has been assigned to room 303, by [place].   ? ?ER staff is aware of the admission: ?Luann, ER Secretary   ?Dr. Jacqualine Code, ER MD  ?Joellen Jersey, Patient's Nurse  ?Seth Bake,  Patient Access.  ?

## 2022-01-20 NOTE — ED Notes (Signed)
Pt is a/o x4 left ambulatory to BMU for inpatient psych tx.  Pt is calm and compliant all belongings accounted for and transferred with patient ?

## 2022-01-20 NOTE — H&P (Signed)
Psychiatric Admission Assessment Adult  Patient Identification: Autumn [name] MRN:  [id number] Date of Evaluation:  01/20/2022 Chief Complaint:  Bipolar 1 disorder (HCC) [F31.9] Principal Diagnosis: Bipolar 1 disorder (HCC) Diagnosis:  Principal Problem:   Bipolar 1 disorder (HCC) Active Problems:   Amphetamine use disorder, severe (HCC)   Opioid use disorder, severe, dependence (HCC)   Cannabis use disorder, severe, dependence (HCC)   Opiate abuse, continuous (HCC)   Cocaine abuse (HCC)  History of Present Illness: Patient seen and chart reviewed.  Patient known from previous encounters.  46 year old woman with history of bipolar disorder and substance abuse.  Came back to the emergency room disorganized agitated voicing suicidal ideation with delusional paranoid thinking.  Had been off of her medicine and abusing drugs.  Got thrown out of her apartment.  Agitated requiring sedating medicine in the emergency room.  Did not follow up with [facility] Associated Signs/Symptoms: Depression Symptoms:  depressed mood, feelings of worthlessness/guilt, difficulty concentrating, suicidal thoughts without plan, anxiety, Duration of Depression Symptoms: Greater than two weeks  (Hypo) Manic Symptoms:  Flight of Ideas, Impulsivity, Irritable Mood, Anxiety Symptoms:  Excessive Worry, Psychotic Symptoms:  Delusions, Paranoia, PTSD Symptoms: Negative Total Time spent with patient: 1 hour  Past Psychiatric History: Long past psychiatric history of chronic bipolar disorder with frequent presentations with psychotic mixed or manic episodes accompanied by substance abuse  Is the patient at risk to self? Yes.    Has the patient been a risk to self in the past 6 months? Yes.    Has the patient been a risk to self within the distant past? Yes.    Is the patient a risk to others? No.  Has the patient been a risk to others in the past 6 months? No.  Has the patient been a risk to others within the distant  past? No.   Prior Inpatient Therapy:   Prior Outpatient Therapy:    Alcohol Screening: 1. How often do you have a drink containing alcohol?: 2 to 3 times a week 2. How many drinks containing alcohol do you have on a typical day when you are drinking?: 1 or 2 3. How often do you have six or more drinks on one occasion?: Never AUDIT-C Score: 3 4. How often during the last year have you found that you were not able to stop drinking once you had started?: Never 5. How often during the last year have you failed to do what was normally expected from you because of drinking?: Never 6. How often during the last year have you needed a first drink in the morning to get yourself going after a heavy drinking session?: Never 7. How often during the last year have you had a feeling of guilt of remorse after drinking?: Never 8. How often during the last year have you been unable to remember what happened the night before because you had been drinking?: Never 9. Have you or someone else been injured as a result of your drinking?: No 10. Has a relative or friend or a doctor or another health worker been concerned about your drinking or suggested you cut down?: No Alcohol Use Disorder Identification Test Final Score (AUDIT): 3 Substance Abuse History in the last 12 months:  Yes.   Consequences of Substance Abuse: Severe social problems inability to stay stable anywhere failure to follow up with outpatient treatment Previous Psychotropic Medications: Yes  Psychological Evaluations: Yes  Past Medical History:  Past Medical History:  Diagnosis Date   Allergy  Seasonal, Ultram (seizures)   Anemia 2005   Bipolar 1 disorder (HCC)    Glaucoma    Seizures (HCC) 2008    Past Surgical History:  Procedure Laterality Date   BREAST EXCISIONAL BIOPSY Right    x 2   CESAREAN SECTION     x 2   LEEP  2000   Family History:  Family History  Problem Relation Age of Onset   Hypertension Mother     Hyperlipidemia Mother    Asthma Mother    Parkinson's disease Father    Lung cancer Maternal Grandmother    Other Paternal Grandfather 18       Cardiac arrest   Heart disease Paternal Grandfather    Tourette syndrome Daughter    Family Psychiatric  History: None reported Tobacco Screening:   Social History:  Social History   Substance and Sexual Activity  Alcohol Use Yes   Alcohol/week: 4.0 standard drinks   Types: 4 Cans of beer per week   Comment: Ocassional     Social History   Substance and Sexual Activity  Drug Use Yes   Types: Marijuana   Comment: percocet -last use over 1 week ago. Heroin-last used 2/11    Additional Social History: Marital status: Separated Separated, when?: 2008 What types of issues is patient dealing with in the relationship?: none reported Additional relationship information: none reported Are you sexually active?: Yes What is your sexual orientation?: Bisexual Has your sexual activity been affected by drugs, alcohol, medication, or emotional stress?: Patient states yes but was unable to identify the specific cause. Does patient have children?: Yes How many children?: 2 How is patient's relationship with their children?: Patient states she does not have a relationship with her children.                         Allergies:   Allergies  Allergen Reactions   Haldol [Haloperidol Lactate]     Patient states that she gets stiff muscles when taking Haldol   Ultram [Tramadol] Other (See Comments)    Seizures   Ziprasidone Hcl     Other reaction(s): Other (See Comments) PER PT WHEN SHE TAKES THIS SHE CANT MOVE HER NECK   Lab Results:  Results for orders placed or performed during the hospital encounter of 01/19/22 (from the past 48 hour(s))  Comprehensive metabolic panel     Status: Abnormal   Collection Time: 01/19/22 10:42 AM  Result Value Ref Range   Sodium 135 135 - 145 mmol/L   Potassium 3.5 3.5 - 5.1 mmol/L   Chloride 104 98  - 111 mmol/L   CO2 23 22 - 32 mmol/L   Glucose, Bld 131 (H) 70 - 99 mg/dL    Comment: Glucose reference range applies only to samples taken after fasting for at least 8 hours.   BUN 13 6 - 20 mg/dL   Creatinine, Ser 4.09 0.44 - 1.00 mg/dL   Calcium 9.0 8.9 - 81.1 mg/dL   Total Protein 7.0 6.5 - 8.1 g/dL   Albumin 4.0 3.5 - 5.0 g/dL   AST 10 (L) 15 - 41 U/L   ALT 17 0 - 44 U/L   Alkaline Phosphatase 59 38 - 126 U/L   Total Bilirubin 0.6 0.3 - 1.2 mg/dL   GFR, Estimated >91 >47 mL/min    Comment: (NOTE) Calculated using the CKD-EPI Creatinine Equation (2021)    Anion gap 8 5 - 15    Comment: Performed  at [facility], [address] [place], [place] [address]  Ethanol     Status: None   Collection Time: 01/19/22 10:42 AM  Result Value Ref Range   Alcohol, Ethyl (B) <10 <10 mg/dL    Comment: (NOTE) Lowest detectable limit for serum alcohol is 10 mg/dL.  For medical purposes only. Performed at [facility], [address] [place], [place] [address]   cbc     Status: Abnormal   Collection Time: 01/19/22 10:42 AM  Result Value Ref Range   WBC 13.1 (H) 4.0 - 10.5 K/uL   RBC 4.16 3.87 - 5.11 MIL/uL   Hemoglobin 12.6 12.0 - 15.0 g/dL   HCT 64.4 03.4 - 74.2 %   MCV 94.0 80.0 - 100.0 fL   MCH 30.3 26.0 - 34.0 pg   MCHC 32.2 30.0 - 36.0 g/dL   RDW 59.5 63.8 - 75.6 %   Platelets 401 (H) 150 - 400 K/uL   nRBC 0.0 0.0 - 0.2 %    Comment: Performed at [facility], [address], [place], [place] [address]  Resp Panel by RT-PCR (Flu A&B, Covid)     Status: None   Collection Time: 01/19/22 10:44 AM   Specimen: Nasopharyngeal(NP) swabs in vial transport medium  Result Value Ref Range   SARS Coronavirus 2 by RT PCR NEGATIVE NEGATIVE    Comment: (NOTE) SARS-CoV-2 target nucleic acids are NOT DETECTED.  The SARS-CoV-2 RNA is generally detectable in upper respiratory specimens during the acute phase of infection. The lowest concentration of SARS-CoV-2  viral copies this assay can detect is 138 copies/mL. A negative result does not preclude SARS-Cov-2 infection and should not be used as the sole basis for treatment or other patient management decisions. A negative result may occur with  improper specimen collection/handling, submission of specimen other than nasopharyngeal swab, presence of viral mutation(s) within the areas targeted by this assay, and inadequate number of viral copies(<138 copies/mL). A negative result must be combined with clinical observations, patient history, and epidemiological information. The expected result is Negative.  Fact Sheet for Patients:  BloggerCourse.com  Fact Sheet for Healthcare Providers:  SeriousBroker.it  This test is no t yet approved or cleared by the [place] FDA and  has been authorized for detection and/or diagnosis of SARS-CoV-2 by FDA under an Emergency Use Authorization (EUA). This EUA will remain  in effect (meaning this test can be used) for the duration of the COVID-19 declaration under Section 564(b)(1) of the Act, 21 U.S.C.section 360bbb-3(b)(1), unless the authorization is terminated  or revoked sooner.       Influenza A by PCR NEGATIVE NEGATIVE   Influenza B by PCR NEGATIVE NEGATIVE    Comment: (NOTE) The Xpert Xpress SARS-CoV-2/FLU/RSV plus assay is intended as an aid in the diagnosis of influenza from Nasopharyngeal swab specimens and should not be used as a sole basis for treatment. Nasal washings and aspirates are unacceptable for Xpert Xpress SARS-CoV-2/FLU/RSV testing.  Fact Sheet for Patients: BloggerCourse.com  Fact Sheet for Healthcare Providers: SeriousBroker.it  This test is not yet approved or cleared by the [place] FDA and has been authorized for detection and/or diagnosis of SARS-CoV-2 by FDA under an Emergency Use Authorization (EUA). This EUA  will remain in effect (meaning this test can be used) for the duration of the COVID-19 declaration under Section 564(b)(1) of the Act, 21 U.S.C. section 360bbb-3(b)(1), unless the authorization is terminated or revoked.  Performed at [facility], [address] [place], [place] [address]   Urine Drug Screen, Qualitative     Status: None   Collection Time: 01/19/22 10:59 AM  Result Value Ref Range   Tricyclic, Ur Screen NONE DETECTED NONE DETECTED   Amphetamines, Ur Screen NONE DETECTED NONE DETECTED   MDMA (Ecstasy)Ur Screen NONE DETECTED NONE DETECTED   Cocaine Metabolite,Ur [place] NONE DETECTED NONE DETECTED   Opiate, Ur Screen NONE DETECTED NONE DETECTED   Phencyclidine (PCP) Ur S NONE DETECTED NONE DETECTED   Cannabinoid 50 Ng, Ur [place] NONE DETECTED NONE DETECTED   Barbiturates, Ur Screen NONE DETECTED NONE DETECTED   Benzodiazepine, Ur Scrn NONE DETECTED NONE DETECTED   Methadone Scn, Ur NONE DETECTED NONE DETECTED    Comment: (NOTE) Tricyclics + metabolites, urine    Cutoff 1000 ng/mL Amphetamines + metabolites, urine  Cutoff 1000 ng/mL MDMA (Ecstasy), urine              Cutoff 500 ng/mL Cocaine Metabolite, urine          Cutoff 300 ng/mL Opiate + metabolites, urine        Cutoff 300 ng/mL Phencyclidine (PCP), urine         Cutoff 25 ng/mL Cannabinoid, urine                 Cutoff 50 ng/mL Barbiturates + metabolites, urine  Cutoff 200 ng/mL Benzodiazepine, urine              Cutoff 200 ng/mL Methadone, urine                   Cutoff 300 ng/mL  The urine drug screen provides only a preliminary, unconfirmed analytical test result and should not be used for non-medical purposes. Clinical consideration and professional judgment should be applied to any positive drug screen result due to possible interfering substances. A more specific alternate chemical method must be used in order to obtain a confirmed analytical result. Gas chromatography / mass spectrometry (GC/MS)  is the preferred confirm atory method. Performed at [facility], [address] [place], [place] [address]   POC urine preg, ED     Status: None   Collection Time: 01/19/22 11:05 AM  Result Value Ref Range   Preg Test, Ur NEGATIVE NEGATIVE    Comment:        THE SENSITIVITY OF THIS METHODOLOGY IS >24 mIU/mL     Blood Alcohol level:  Lab Results  Component Value Date   ETH <10 01/19/2022   ETH <10 12/24/2021    Metabolic Disorder Labs:  Lab Results  Component Value Date   HGBA1C 4.9 12/25/2021   MPG 93.93 12/25/2021   MPG 114 06/02/2021   No results found for: PROLACTIN Lab Results  Component Value Date   CHOL 160 12/25/2021   TRIG 95 12/25/2021   HDL 58 12/25/2021   CHOLHDL 2.8 12/25/2021   VLDL 19 12/25/2021   LDLCALC 83 12/25/2021   LDLCALC 63 06/01/2021    Current Medications: Current Facility-Administered Medications  Medication Dose Route Frequency Provider Last Rate Last Admin   acetaminophen (TYLENOL) tablet 650 mg  650 mg Oral Q6H PRN Vanetta Mulders, NP       alum & mag hydroxide-simeth (MAALOX/MYLANTA) 200-200-20 MG/5ML suspension 30 mL  30 mL Oral Q4H PRN Vanetta Mulders, NP       [START ON 01/21/2022] ARIPiprazole (ABILIFY) tablet 30 mg  30 mg Oral Daily Vanetta Mulders, NP       divalproex (  DEPAKOTE) DR tablet 750 mg  750 mg Oral Q12H Barthold, Nickola Major, NP       lithium carbonate (LITHOBID) CR tablet 600 mg  600 mg Oral Q12H Barthold, Louise F, NP       magnesium hydroxide (MILK OF MAGNESIA) suspension 30 mL  30 mL Oral Daily PRN Vanetta Mulders, NP       [START ON 01/21/2022] nicotine (NICODERM CQ - dosed in mg/24 hours) patch 21 mg  21 mg Transdermal Daily Barthold, Louise F, NP       QUEtiapine (SEROQUEL) tablet 150 mg  150 mg Oral QHS Vanetta Mulders, NP       PTA Medications: Medications Prior to Admission  Medication Sig Dispense Refill Last Dose   ARIPiprazole (ABILIFY) 30 MG tablet Take 1 tablet (30 mg total) by  mouth daily. 30 tablet 1    divalproex (DEPAKOTE) 250 MG DR tablet Take 3 tablets (750 mg total) by mouth every 12 (twelve) hours. 180 tablet 1    hydrOXYzine (ATARAX) 50 MG tablet Take 1 tablet (50 mg total) by mouth every 6 (six) hours as needed for anxiety. 60 tablet 1    lithium carbonate (LITHOBID) 300 MG CR tablet Take 2 tablets (600 mg total) by mouth every 12 (twelve) hours. 120 tablet 1    nicotine (NICODERM CQ - DOSED IN MG/24 HOURS) 21 mg/24hr patch Place 1 patch (21 mg total) onto the skin daily at 6 (six) AM. 28 patch 1    QUEtiapine 150 MG TABS Take 150 mg by mouth at bedtime. 30 tablet 1     Musculoskeletal: Strength & Muscle Tone: within normal limits Gait & Station: normal Patient leans: N/A            Psychiatric Specialty Exam:  Presentation  General Appearance: Bizarre; Disheveled  Eye Contact:Good  Speech:Pressured; Slurred  Speech Volume:Decreased  Handedness:Right   Mood and Affect  Mood:Euphoric  Affect:Inappropriate; Full Range   Thought Process  Thought Processes:Disorganized  Duration of Psychotic Symptoms: Greater than six months  Past Diagnosis of Schizophrenia or Psychoactive disorder: No  Descriptions of Associations:Tangential  Orientation:Partial  Thought Content:Obsessions; Paranoid Ideation; Illogical  Hallucinations:No data recorded Ideas of Reference:Delusions  Suicidal Thoughts:No data recorded Homicidal Thoughts:No data recorded  Sensorium  Memory:Immediate Poor; Recent Poor; Remote Poor  Judgment:Poor  Insight:Poor   Executive Functions  Concentration:Poor  Attention Span:Poor  Recall:Poor  Fund of Knowledge:Poor  Language:Poor   Psychomotor Activity  Psychomotor Activity:No data recorded  Assets  Assets:Communication Skills; Desire for Improvement; Resilience; Social Support   Sleep  Sleep:No data recorded   Physical Exam: Physical Exam Vitals and nursing note reviewed.   Constitutional:      Appearance: Normal appearance.  HENT:     Head: Normocephalic and atraumatic.     Mouth/Throat:     Pharynx: Oropharynx is clear.  Eyes:     Pupils: Pupils are equal, round, and reactive to light.  Cardiovascular:     Rate and Rhythm: Normal rate and regular rhythm.  Pulmonary:     Effort: Pulmonary effort is normal.     Breath sounds: Normal breath sounds.  Abdominal:     General: Abdomen is flat.     Palpations: Abdomen is soft.  Musculoskeletal:        General: Normal range of motion.  Skin:    General: Skin is warm and dry.  Neurological:     General: No focal deficit present.     Mental Status: She is  alert. Mental status is at baseline.  Psychiatric:        Attention and Perception: She is inattentive.        Mood and Affect: Mood normal. Affect is blunt and inappropriate.        Behavior: Behavior is withdrawn.        Thought Content: Thought content is paranoid.        Cognition and Memory: Cognition is impaired.        Judgment: Judgment is impulsive.   Review of Systems  Constitutional: Negative.   HENT: Negative.    Eyes: Negative.   Respiratory: Negative.    Cardiovascular: Negative.   Gastrointestinal: Negative.   Musculoskeletal: Negative.   Skin: Negative.   Neurological: Negative.   Psychiatric/Behavioral:  Positive for depression, hallucinations, memory loss, substance abuse and suicidal ideas. The patient is nervous/anxious and has insomnia.   Blood pressure 101/62, pulse 93, temperature 98.3 F (36.8 C), temperature source Oral, resp. rate 18, height 5\' 1"  (1.549 m), weight 46.3 kg, SpO2 99 %. Body mass index is 19.27 kg/m.  Treatment Plan Summary: Medication management and Plan restart medicines including antipsychotics and mood stabilizers.  15-minute checks in place.  Engage in individual and group therapy.  Meet with treatment team.  Work on coming up with some kind of plan for appropriate disposition and living  situation  Observation Level/Precautions:  15 minute checks  Laboratory:  Chemistry Profile  Psychotherapy:    Medications:    Consultations:    Discharge Concerns:    Estimated LOS:  Other:     Physician Treatment Plan for Primary Diagnosis: Bipolar 1 disorder (HCC) Long Term Goal(s): Improvement in symptoms so as ready for discharge  Short Term Goals: Ability to verbalize feelings will improve, Ability to disclose and discuss suicidal ideas, and Ability to demonstrate self-control will improve  Physician Treatment Plan for Secondary Diagnosis: Principal Problem:   Bipolar 1 disorder (HCC) Active Problems:   Amphetamine use disorder, severe (HCC)   Opioid use disorder, severe, dependence (HCC)   Cannabis use disorder, severe, dependence (HCC)   Opiate abuse, continuous (HCC)   Cocaine abuse (HCC)  Long Term Goal(s): Improvement in symptoms so as ready for discharge  Short Term Goals: Compliance with prescribed medications will improve and Ability to identify triggers associated with substance abuse/mental health issues will improve  I certify that inpatient services furnished can reasonably be expected to improve the patient's condition.    Mordecai Rasmussen, MD 3/15/20234:47 PM

## 2022-01-20 NOTE — ED Notes (Signed)
Hospital meal provided, pt tolerated w/o complaints.  Waste discarded appropriately.  

## 2022-01-20 NOTE — Progress Notes (Signed)
46 years old female patient admitted from ED with Bipolar affective disorder. Patient relapsed on crack and alcohol after her discharge from [facility] 2 weeks ago. Patient not compliant with medications. Patient states " people coming home and trying to kill me. I need to be on my medicine." Patient sad and tearful when talking about her boy friend who passes away last [name] 17, 2023. Patient denies SI,HI and AVH. Skin assessment and body search done with [facility] RN. No contraband found. Support and encouragement given. Safety maintained with 15 minutes rounds. ?

## 2022-01-20 NOTE — Tx Team (Signed)
Initial Treatment Plan ?01/20/2022 ?11:21 AM ?Autumn [name] ?[id number] ? ? ? ?PATIENT STRESSORS: ?Financial difficulties   ?Medication change or noncompliance   ?Substance abuse   ?Traumatic event   ? ? ?PATIENT STRENGTHS: ?Average or above average intelligence  ?Communication skills  ?Physical Health  ? ? ?PATIENT IDENTIFIED PROBLEMS: ?Non compliant with medicine  ?Substance abuse  ?  ?  ?  ?  ?  ?  ?  ?  ? ?DISCHARGE CRITERIA:  ?Improved stabilization in mood, thinking, and/or behavior ?Medical problems require only outpatient monitoring ?Motivation to continue treatment in a less acute level of care ? ?PRELIMINARY DISCHARGE PLAN: ?Attend aftercare/continuing care group ?Return to previous living arrangement ? ?PATIENT/FAMILY INVOLVEMENT: ?This treatment plan has been presented to and reviewed with the patient, Autumn [name], and/or family member,  The patient and family have been given the opportunity to ask questions and make suggestions. ? ?Merlene Morse, RN ?01/20/2022, 11:21 AM ?

## 2022-01-20 NOTE — [facility] Suicide Risk Assessment (Signed)
[facility] INPATIENT:  Family/Significant Other Suicide Prevention Education ? ?Suicide Prevention Education:  ?Contact Attempts: Autumn [name], (Mother) at [phone] has been identified by the patient as the family member/significant other with whom the patient will be residing, and identified as the person(s) who will aid the patient in the event of a mental health crisis.  With written consent from the patient, two attempts were made to provide suicide prevention education, prior to and/or following the patient's discharge.  We were unsuccessful in providing suicide prevention education.  A suicide education pamphlet was given to the patient to share with family/significant other. ? ?Date and time of first attempt: 01/20/2022 @ 4:06 PM, phone number provided is no longer in service.  ?Date and time of second attempt:CSW will make additional attempts to obtain new phone number and reach collateral contact.  ? ?Durenda Hurt ?01/20/2022, 4:06 PM ?
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
## 2022-01-20 NOTE — ED Provider Notes (Signed)
Emergency Medicine Observation Re-evaluation Note ? ?Autumn [name] is a 46 y.o. female, seen on rounds today.  Pt initially presented to the ED for complaints of Psychiatric Evaluation ?Currently, the patient is resting comfortably. ? ?Physical Exam  ?BP 106/67   Pulse 94   Temp 98.6 ?F (37 ?C) (Oral)   Resp 17   Ht '5\' 1"'$  (1.549 m)   Wt 44 kg   SpO2 98%   BMI 18.33 kg/m?  ?Physical Exam ?Gen: No acute distress  ?Resp: Normal rise and fall of chest ?Neuro: Moving all four extremities ?Psych: Resting currently, calm and cooperative when awake ? ? ? ?ED Course / MDM  ?EKG:  ? ?I have reviewed the labs performed to date as well as medications administered while in observation.  Recent changes in the last 24 hours include no acute events overnight. ? ?Plan  ?Current plan is for inpatient psychiatric treatment. ? Arelia Volpe is not under involuntary commitment. ? ? ?  ?Marchello Rothgeb, Delice Bison, DO ?01/20/22 0315 ? ?

## 2022-01-20 NOTE — BH Assessment (Signed)
Patient to be reviewed with [facility] BMU today ?

## 2022-01-20 NOTE — ED Notes (Signed)
Report received from ED , RN including SBAR. On initial round after report Pt is warm/dry, resting quietly in room without any s/s of distress.  Will continue to monitor throughout shift as ordered for any changes in behaviors and for continued safety.  Pt apologized to this RN for behaviors yesterday stating "I'm so sorry I was acting like that I wasn't in my right mind, I didn't sleep for 5 days and I just need to get back on my meds."  Staff allowed time for pt to voice feelings and concerns.  Cont to monitor as ordered ?

## 2022-01-20 NOTE — BHH Suicide Risk Assessment (Signed)
[facility] Admission Suicide Risk Assessment ? ? ?Nursing information obtained from:  Patient ?Demographic factors:  Unemployed, Low socioeconomic status, Caucasian, Living alone ?Current Mental Status:  NA ?Loss Factors:  Loss of significant relationship ?Historical Factors:  Impulsivity ?Risk Reduction Factors:  Positive therapeutic relationship ? ?Total Time spent with patient: 30 minutes ?Principal Problem: Bipolar 1 disorder ([place]) ?Diagnosis:  Principal Problem: ?  Bipolar 1 disorder ([place]) ?Active Problems: ?  Amphetamine use disorder, severe ([place]) ?  Opioid use disorder, severe, dependence ([place]) ?  Cannabis use disorder, severe, dependence ([name]) ?  Opiate abuse, continuous ([place]) ?  Cocaine abuse ([place]) ? ?Subjective Data: Patient seen and chart reviewed.  Patient known from previous encounters.  46 year old woman with bipolar disorder and substance abuse came to the hospital agitated psychotic using drugs and not taking her medicine.  Passive suicidal thoughts.  Continues to have passive suicidal thoughts and hopelessness but is cooperative with treatment ? ?Continued Clinical Symptoms:  ?Alcohol Use Disorder Identification Test Final Score (AUDIT): 3 ?The "Alcohol Use Disorders Identification Test", Guidelines for Use in Primary Care, Second Edition.  World Pharmacologist [facility]). ?Score between 0-7:  no or low risk or alcohol related problems. ?Score between 8-15:  moderate risk of alcohol related problems. ?Score between 16-19:  high risk of alcohol related problems. ?Score 20 or above:  warrants further diagnostic evaluation for alcohol dependence and treatment. ? ? ?CLINICAL FACTORS:  ? Bipolar Disorder:   Mixed State ? ? ?Musculoskeletal: ?Strength & Muscle Tone: within normal limits ?Gait & Station: normal ?Patient leans: N/A ? ?Psychiatric Specialty Exam: ? ?Presentation  ?General Appearance: Bizarre; Disheveled ? ?Eye Contact:Good ? ?Speech:Pressured; Slurred ? ?Speech  Volume:Decreased ? ?Handedness:Right ? ? ?Mood and Affect  ?Mood:Euphoric ? ?Affect:Inappropriate; Full Range ? ? ?Thought Process  ?Thought Processes:Disorganized ? ?Descriptions of Associations:Tangential ? ?Orientation:Partial ? ?Thought Content:Obsessions; Paranoid Ideation; Illogical ? ?History of Schizophrenia/Schizoaffective disorder:No ? ?Duration of Psychotic Symptoms:Greater than six months ? ?Hallucinations:No data recorded ?Ideas of Reference:Delusions ? ?Suicidal Thoughts:No data recorded ?Homicidal Thoughts:No data recorded ? ?Sensorium  ?Memory:Immediate Poor; Recent Poor; Remote Poor ? ?Judgment:Poor ? ?Insight:Poor ? ? ?Executive Functions  ?Concentration:Poor ? ?Attention Span:Poor ? ?Recall:Poor ? ?Fund of Knowledge:Poor ? ?Language:Poor ? ? ?Psychomotor Activity  ?Psychomotor Activity:No data recorded ? ?Assets  ?Assets:Communication Skills; Desire for Improvement; Resilience; Social Support ? ? ?Sleep  ?Sleep:No data recorded ? ? ?Physical Exam: ?Physical Exam ?Nursing note reviewed.  ?Constitutional:   ?   Appearance: Normal appearance.  ?HENT:  ?   Head: Normocephalic and atraumatic.  ?   Mouth/Throat:  ?   Pharynx: Oropharynx is clear.  ?Eyes:  ?   Pupils: Pupils are equal, round, and reactive to light.  ?Cardiovascular:  ?   Rate and Rhythm: Normal rate and regular rhythm.  ?Pulmonary:  ?   Effort: Pulmonary effort is normal.  ?   Breath sounds: Normal breath sounds.  ?Abdominal:  ?   General: Abdomen is flat.  ?   Palpations: Abdomen is soft.  ?Musculoskeletal:     ?   General: Normal range of motion.  ?Skin: ?   General: Skin is warm and dry.  ?Neurological:  ?   General: No focal deficit present.  ?   Mental Status: She is alert. Mental status is at baseline.  ?Psychiatric:     ?   Attention and Perception: She is inattentive.     ?   Mood and Affect: Mood normal. Affect is inappropriate. Affect is not angry.     ?  Speech: Speech is tangential.     ?   Behavior: Behavior is slowed and  withdrawn.     ?   Thought Content: Thought content is paranoid and delusional.     ?   Cognition and Memory: Cognition is impaired.  ? ?Review of Systems  ?Constitutional: Negative.   ?HENT: Negative.    ?Eyes: Negative.   ?Respiratory: Negative.    ?Cardiovascular: Negative.   ?Gastrointestinal: Negative.   ?Musculoskeletal: Negative.   ?Skin: Negative.   ?Neurological: Negative.   ?Psychiatric/Behavioral:  Positive for depression, hallucinations, substance abuse and suicidal ideas. The patient is nervous/anxious.   ?Blood pressure 101/62, pulse 93, temperature 98.3 ?F (36.8 ?C), temperature source Oral, resp. rate 18, height '5\' 1"'$  (1.549 m), weight 46.3 kg, SpO2 99 %. Body mass index is 19.27 kg/m?. ? ? ?COGNITIVE FEATURES THAT CONTRIBUTE TO RISK:  ?Thought constriction (tunnel vision)   ? ?SUICIDE RISK:  ? Mild:  Suicidal ideation of limited frequency, intensity, duration, and specificity.  There are no identifiable plans, no associated intent, mild dysphoria and related symptoms, good self-control (both objective and subjective assessment), few other risk factors, and identifiable protective factors, including available and accessible social support. ? ?PLAN OF CARE: Continue 15-minute checks.  Restart medicine.  Engage in individual and group therapy and meet with treatment team.  Reassess dangerousness ongoing prior to discharge ? ?I certify that inpatient services furnished can reasonably be expected to improve the patient's condition.  ? ?Alethia Berthold, MD ?01/20/2022, 4:45 PM ? ?

## 2022-01-21 DIAGNOSIS — F319 Bipolar disorder, unspecified: Secondary | ICD-10-CM | POA: Diagnosis not present

## 2022-01-21 MED ORDER — ARIPIPRAZOLE 10 MG PO TABS
15.0000 mg | ORAL_TABLET | Freq: Every day | ORAL | Status: DC
Start: 2022-01-22 — End: 2022-02-05
  Administered 2022-01-22 – 2022-02-05 (×15): 15 mg via ORAL
  Filled 2022-01-21 (×15): qty 1

## 2022-01-21 MED ORDER — QUETIAPINE FUMARATE 200 MG PO TABS
300.0000 mg | ORAL_TABLET | Freq: Every day | ORAL | Status: DC
  Administered 2022-01-21 – 2022-01-26 (×5): 300 mg via ORAL
  Filled 2022-01-21 (×5): qty 1

## 2022-01-21 MED ORDER — ENSURE ENLIVE PO LIQD
237.0000 mL | Freq: Two times a day (BID) | ORAL | Status: DC
  Administered 2022-01-21 – 2022-02-04 (×28): 237 mL via ORAL

## 2022-01-21 NOTE — BHH Suicide Risk Assessment (Addendum)
BHH INPATIENT:  Family/Significant Other Suicide Prevention Education ? ?Suicide Prevention Education:  ?Contact Attempts: Uvaldo Rising, Mother, [phone], (name of family member/significant other) has been identified by the patient as the family member/significant other with whom the patient will be residing, and identified as the person(s) who will aid the patient in the event of a mental health crisis.  With written consent from the patient, two attempts were made to provide suicide prevention education, prior to and/or following the patient's discharge.  We were unsuccessful in providing suicide prevention education.  A suicide education pamphlet was given to the patient to share with family/significant other. ?  ?Date and time of first attempt:01/20/2022 @ 4:06 PM, phone number provided is no longer in service. ?Date and time of second attempt: 9:54am/01/21/2022   ? ?CSW left a voicemail to return call.  ? ?Karalee Height ?01/21/2022, 9:54 AM ?

## 2022-01-21 NOTE — Progress Notes (Signed)
Patient rates pain 5/10. PRN tylenol provided to patient. Patient rates anxiety 6/10. Patient states she has anxiety about men she doesn't know telling her what to do. Patient denies SI/HI/AVH. Patient denies depression. Patient compliant with medication administration.  ?Q15 minute safety checks maintained. Patient remains safe on the unit at this time.  ?

## 2022-01-21 NOTE — Plan of Care (Signed)
?  Problem: Education: ?Goal: Knowledge of [facility] Education information/materials will improve ?Outcome: Progressing ?Goal: Emotional status will improve ?Outcome: Progressing ?Goal: Mental status will improve ?Outcome: Progressing ?Goal: Verbalization of understanding the information provided will improve ?Outcome: Progressing ?  ?Problem: Health Behavior/Discharge Planning: ?Goal: Identification of resources available to assist in meeting health care needs will improve ?Outcome: Progressing ?Goal: Compliance with treatment plan for underlying cause of condition will improve ?Outcome: Progressing ?  ?Problem: Education: ?Goal: Utilization of techniques to improve thought processes will improve ?Outcome: Progressing ?Goal: Knowledge of the prescribed therapeutic regimen will improve ?Outcome: Progressing ?  ?

## 2022-01-21 NOTE — Progress Notes (Signed)
Recreation Therapy Notes ? ?INPATIENT RECREATION THERAPY ASSESSMENT ? ?Patient Details ?Name: Autumn [name] ?MRN: [id number] ?DOB: Aug 13, 1976 ?Today's Date: 01/21/2022 ?      ?Information Obtained From: ?Patient ? ?Able to Participate in Assessment/Interview: ?Yes ? ?Patient Presentation: ?Responsive ? ?Reason for Admission (Per Patient): ?Active Symptoms ? ?Patient Stressors: ?  ? ?Coping Skills:   ?Isolation, Avoidance, Substance Abuse, Music, Exercise ? ?Leisure Interests (2+):  ?Exercise - Walking, Individual - Reading, Art - Coloring, Sports - Swimming, Individual - TV ? ?Frequency of Recreation/Participation: ?Monthly ? ?Awareness of Community Resources:  ?Yes ? ?Community Resources:  ?[name], [employer], [employer], [employer], [place], [employer], Patent examiner ? ?Current Use: ?No ? ?If no, Barriers?: ?Financial, Transportation, Attitudinal ? ?Expressed Interest in [employer] Information: ?Yes ? ?[place] of Residence:  ?[place] ? ?Patient Main Form of Transportation: ?Walk ? ?Patient Strengths:  ?Learning ? ?Patient Identified Areas of Improvement:  ?a lot ? ?Patient Goal for Hospitalization:  ?Being well ? ?Current SI (including self-harm):  ?No ? ?Current HI:  ?No ? ?Current AVH: ?No ? ?Staff Intervention Plan: ?Group Attendance, Collaborate with Interdisciplinary Treatment Team ? ?Consent to Intern Participation: ?N/A ? ?Lenorris Karger ?01/21/2022, 1:07 PM ?

## 2022-01-21 NOTE — Progress Notes (Signed)
Recreation Therapy Notes ? ?Date: 01/21/2022 ? ?Time: 10:05 am ? ?Location: Craft room   ? ?Behavioral response: Appropriate  ? ?Intervention Topic: Animal Assisted Therapy  ? ?Discussion/Intervention:  ?Animal Assisted Therapy took place today during group.  Animal Assisted Therapy is the planned inclusion of an animal in a patient's treatment plan. The patients were able to engage in therapy with an animal during group. Participants were educated on what a service dog is and the different between a support dog and a service dog. Patient were informed on how animal needs are like a person needs. Individuals were enlightened on the process to get a service animal or support animal. Patients got the opportunity to pet the animal and were offered emotional support from the animal and staff.  ?Clinical Observations/Feedback:  ?Patient came to group and was on topic and was focused on what peers and staff had to say. Participant shared their experiences and history with animals. Individual was social with peers, staff and animal while participating in group. Patient was tearful on and off during group; she eventually left group early due to unknown reasons. ?Autumn [name] LRT/CTRS  ? ? ? ? ? ? ? ?Autumn [name] ?01/21/2022 12:13 PM ?
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
## 2022-01-21 NOTE — Group Note (Signed)
LCSW Group Therapy Note ? ?Group Date: 01/21/2022 ?Start Time: 1300 ?End Time: 1400 ? ? ?Type of Therapy and Topic:  Group Therapy - How To Cope with Nervousness about Discharge  ? ?Participation Level:  Did Not Attend  ? ?Description of Group ?This process group involved identification of patients' feelings about discharge. Some of them are scheduled to be discharged soon, while others are new admissions, but each of them was asked to share thoughts and feelings surrounding discharge from the hospital. One common theme was that they are excited at the prospect of going home, while another was that many of them are apprehensive about sharing why they were hospitalized. Patients were given the opportunity to discuss these feelings with their peers in preparation for discharge. ? ?Therapeutic Goals ? ?Patient will identify their overall feelings about pending discharge. ?Patient will think about how they might proactively address issues that they believe will once again arise once they get home (i.e. with parents). ?Patients will participate in discussion about having hope for change. ? ? ?Summary of Patient Progress:   ?X ? ? ?Therapeutic Modalities ?Cognitive Behavioral Therapy ? ? ?Rozann Lesches, LCSWA ?01/21/2022  1:29 PM   ?

## 2022-01-21 NOTE — Progress Notes (Signed)
Recreation Therapy Notes ? ?INPATIENT RECREATION TR PLAN ? ?Patient Details ?Name: Autumn [name] ?MRN: [id number] ?DOB: October 10, 1976 ?Today's Date: 01/21/2022 ? ?Rec Therapy Plan ?Is patient appropriate for Therapeutic Recreation?: Yes ?Treatment times per week: at least 3 ?Estimated Length of Stay: 5-7 days ?TR Treatment/Interventions: Group participation (Comment) ? ?Discharge Criteria ?  ? ?Discharge Summary ?  ? ? ?Rishita Petron ?01/21/2022, 1:09 PM ?
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
## 2022-01-21 NOTE — Plan of Care (Signed)
?  Problem: Education: ?Goal: Knowledge of [facility] Education information/materials will improve ?Outcome: Progressing ?Goal: Verbalization of understanding the information provided will improve ?Outcome: Progressing ?  ?Problem: Health Behavior/Discharge Planning: ?Goal: Compliance with treatment plan for underlying cause of condition will improve ?Outcome: Progressing ?  ?Problem: Education: ?Goal: Knowledge of the prescribed therapeutic regimen will improve ?Outcome: Progressing ?  ?Problem: Coping: ?Goal: Will verbalize feelings ?Outcome: Progressing ?  ?Problem: Self-Concept: ?Goal: Level of anxiety will decrease ?Outcome: Not Progressing ?  ?

## 2022-01-21 NOTE — Progress Notes (Signed)
[facility] MD Progress Note  01/21/2022 2:10 PM Autumn [name]  MRN:  [id number] Subjective: Follow-up patient with bipolar disorder and substance abuse.  Patient blunted flat disorganized disheveled.  Still having hallucinations.  Hitting herself at times although making an effort not to.  I ask her about her mouth pain which apparently she keeps complaining about to nurses and once again she denied to me having any pain in her mouth.  She did say she was not sleeping well and just observing her she looked stiff.  On examination there was some cogwheeling in her arms. Principal Problem: Bipolar 1 disorder (HCC) Diagnosis: Principal Problem:   Bipolar 1 disorder (HCC) Active Problems:   Amphetamine use disorder, severe (HCC)   Opioid use disorder, severe, dependence (HCC)   Cannabis use disorder, severe, dependence (HCC)   Opiate abuse, continuous (HCC)   Cocaine abuse (HCC)  Total Time spent with patient: 30 minutes  Past Psychiatric History: Past history of recurrent psychotic symptoms and substance abuse  Past Medical History:  Past Medical History:  Diagnosis Date   Allergy    Seasonal, Ultram (seizures)   Anemia 2005   Bipolar 1 disorder (HCC)    Glaucoma    Seizures (HCC) 2008    Past Surgical History:  Procedure Laterality Date   BREAST EXCISIONAL BIOPSY Right    x 2   CESAREAN SECTION     x 2   LEEP  2000   Family History:  Family History  Problem Relation Age of Onset   Hypertension Mother    Hyperlipidemia Mother    Asthma Mother    Parkinson's disease Father    Lung cancer Maternal Grandmother    Other Paternal Grandfather 67       Cardiac arrest   Heart disease Paternal Grandfather    Tourette syndrome Daughter    Family Psychiatric  History: See previous Social History:  Social History   Substance and Sexual Activity  Alcohol Use Yes   Alcohol/week: 4.0 standard drinks   Types: 4 Cans of beer per week   Comment: Ocassional     Social History    Substance and Sexual Activity  Drug Use Yes   Types: Marijuana   Comment: percocet -last use over 1 week ago. Heroin-last used 2/11    Social History   Socioeconomic History   Marital status: Legally Separated    Spouse name: Not on file   Number of children: Not on file   Years of education: Not on file   Highest education level: Not on file  Occupational History   Not on file  Tobacco Use   Smoking status: Every Day    Packs/day: 1.00    Years: 9.00    Pack years: 9.00    Types: E-cigarettes, Cigarettes   Smokeless tobacco: Never  Vaping Use   Vaping Use: Never used  Substance and Sexual Activity   Alcohol use: Yes    Alcohol/week: 4.0 standard drinks    Types: 4 Cans of beer per week    Comment: Ocassional   Drug use: Yes    Types: Marijuana    Comment: percocet -last use over 1 week ago. Heroin-last used 2/11   Sexual activity: Yes    Birth control/protection: None  Other Topics Concern   Not on file  Social History Narrative   Not on file   Social Determinants of Health   Financial Resource Strain: Not on file  Food Insecurity: Not on file  Transportation Needs: Not  on file  Physical Activity: Not on file  Stress: Not on file  Social Connections: Not on file   Additional Social History:                         Sleep: Fair  Appetite:  Fair  Current Medications: Current Facility-Administered Medications  Medication Dose Route Frequency Provider Last Rate Last Admin   acetaminophen (TYLENOL) tablet 650 mg  650 mg Oral Q6H PRN Vanetta Mulders, NP   650 mg at 01/21/22 0800   alum & mag hydroxide-simeth (MAALOX/MYLANTA) 200-200-20 MG/5ML suspension 30 mL  30 mL Oral Q4H PRN Vanetta Mulders, NP       [START ON 01/22/2022] ARIPiprazole (ABILIFY) tablet 15 mg  15 mg Oral Daily Kazuo Durnil T, MD       divalproex (DEPAKOTE) DR tablet 750 mg  750 mg Oral Q12H Gabriel Cirri F, NP   750 mg at 01/21/22 0759   feeding supplement (ENSURE  ENLIVE / ENSURE PLUS) liquid 237 mL  237 mL Oral BID BM Naol Ontiveros T, MD       lithium carbonate (LITHOBID) CR tablet 600 mg  600 mg Oral Q12H Gabriel Cirri F, NP   600 mg at 01/21/22 0759   magnesium hydroxide (MILK OF MAGNESIA) suspension 30 mL  30 mL Oral Daily PRN Gabriel Cirri F, NP       nicotine (NICODERM CQ - dosed in mg/24 hours) patch 21 mg  21 mg Transdermal Daily Gabriel Cirri F, NP   21 mg at 01/21/22 0800   QUEtiapine (SEROQUEL) tablet 300 mg  300 mg Oral QHS Kayna Suppa T, MD        Lab Results: No results found for this or any previous visit (from the past 48 hour(s)).  Blood Alcohol level:  Lab Results  Component Value Date   ETH <10 01/19/2022   ETH <10 12/24/2021    Metabolic Disorder Labs: Lab Results  Component Value Date   HGBA1C 4.9 12/25/2021   MPG 93.93 12/25/2021   MPG 114 06/02/2021   No results found for: PROLACTIN Lab Results  Component Value Date   CHOL 160 12/25/2021   TRIG 95 12/25/2021   HDL 58 12/25/2021   CHOLHDL 2.8 12/25/2021   VLDL 19 12/25/2021   LDLCALC 83 12/25/2021   LDLCALC 63 06/01/2021    Physical Findings: AIMS:  , ,  ,  ,    CIWA:  CIWA-Ar Total: 2 COWS:     Musculoskeletal: Strength & Muscle Tone: within normal limits Gait & Station: normal Patient leans: N/A  Psychiatric Specialty Exam:  Presentation  General Appearance: Bizarre; Disheveled  Eye Contact:Good  Speech:Pressured; Slurred  Speech Volume:Decreased  Handedness:Right   Mood and Affect  Mood:Euphoric  Affect:Inappropriate; Full Range   Thought Process  Thought Processes:Disorganized  Descriptions of Associations:Tangential  Orientation:Partial  Thought Content:Obsessions; Paranoid Ideation; Illogical  History of Schizophrenia/Schizoaffective disorder:No  Duration of Psychotic Symptoms:Greater than six months  Hallucinations:No data recorded Ideas of Reference:Delusions  Suicidal Thoughts:No data recorded Homicidal  Thoughts:No data recorded  Sensorium  Memory:Immediate Poor; Recent Poor; Remote Poor  Judgment:Poor  Insight:Poor   Executive Functions  Concentration:Poor  Attention Span:Poor  Recall:Poor  Fund of Knowledge:Poor  Language:Poor   Psychomotor Activity  Psychomotor Activity:No data recorded  Assets  Assets:Communication Skills; Desire for Improvement; Resilience; Social Support   Sleep  Sleep:No data recorded   Physical Exam: Physical Exam Vitals and nursing note reviewed.  Constitutional:  Appearance: Normal appearance.  HENT:     Head: Normocephalic and atraumatic.     Mouth/Throat:     Pharynx: Oropharynx is clear.  Eyes:     Pupils: Pupils are equal, round, and reactive to light.  Cardiovascular:     Rate and Rhythm: Normal rate and regular rhythm.  Pulmonary:     Effort: Pulmonary effort is normal.     Breath sounds: Normal breath sounds.  Abdominal:     General: Abdomen is flat.     Palpations: Abdomen is soft.  Musculoskeletal:        General: Normal range of motion.  Skin:    General: Skin is warm and dry.  Neurological:     General: No focal deficit present.     Mental Status: She is alert. Mental status is at baseline.  Psychiatric:        Attention and Perception: She is inattentive.        Mood and Affect: Mood normal. Affect is blunt.        Speech: Speech is delayed.        Behavior: Behavior is slowed.        Thought Content: Thought content is paranoid.        Cognition and Memory: Cognition is impaired.        Judgment: Judgment is impulsive.   Review of Systems  Constitutional: Negative.   HENT: Negative.    Eyes: Negative.   Respiratory: Negative.    Cardiovascular: Negative.   Gastrointestinal: Negative.   Musculoskeletal: Negative.   Skin: Negative.   Neurological: Negative.   Psychiatric/Behavioral:  Positive for depression, hallucinations and substance abuse. Negative for suicidal ideas. The patient is  nervous/anxious and has insomnia.   Blood pressure 91/61, pulse 91, temperature 98.3 F (36.8 C), temperature source Oral, resp. rate 17, height 5\' 1"  (1.549 m), weight 46.3 kg, SpO2 100 %. Body mass index is 19.27 kg/m.   Treatment Plan Summary: Medication management and Plan I am going to try and switch her over to more Seroquel and less Abilify because of the stiffness I am noticing.  Increased Seroquel to 300 mg at night and cut Abilify down.  We will continue to try to cross taper.  Continue mood stabilizing medicine.  Treatment team will work with patient on outpatient options soon  Mordecai Rasmussen, MD 01/21/2022, 2:10 PM

## 2022-01-22 DIAGNOSIS — F319 Bipolar disorder, unspecified: Secondary | ICD-10-CM | POA: Diagnosis not present

## 2022-01-22 NOTE — Progress Notes (Signed)
?   01/22/22 1400  ?Clinical Encounter Type  ?Visited With Patient  ?Visit Type Initial  ?Referral From Chaplain  ?Consult/Referral To Chaplain  ? ?Chaplain visited with patient in her room when she left in the middle of group. Patient was sad and teary. Chaplain encouraged patient to do something each day she can accomplish. Chaplain also provided a Bible as requested. Patient appreciated [place] visit. ?

## 2022-01-22 NOTE — Progress Notes (Signed)
Recreation Therapy Notes ? ?Date: 01/22/2022 ? ?Time: 10:45 am   ? ?Location: Courtyard  ? ?Behavioral response: Appropriate ? ?Intervention Topic:  Leisure  ? ?Discussion/Intervention:  ?Group content today was focused on leisure. The group defined what leisure is and some positive leisure activities they participate in. Individuals identified the difference between good and bad leisure. Participants expressed how they feel after participating in the leisure of their choice. The group discussed how they go about picking a leisure activity and if others are involved in their leisure activities. The patient stated how many leisure activities they have to choose from and reasons why it is important to have leisure time. Individuals participated in the intervention ?Exploration of Leisure? where they had a chance to identify new leisure activities as well as benefits of leisure. ?Clinical Observations/Feedback: ?Patient came to group late due to unknown reasons. Individual was social with staff and peers while participating in the intervention.  ?Sharetta Ricchio LRT/CTRS  ? ? ? ? ? ? ? ?Autumn [name] ?01/22/2022 12:44 PM ?

## 2022-01-22 NOTE — Plan of Care (Signed)
D: Pt alert and oriented. Pt rates depression 0/10, hopelessness 0/10, and anxiety 5/10. Pt goal: "Go to group." Pt reports energy level as normal and concentration as being good. Pt reports sleep last night as being good. Pt did not receive medications for sleep. Pt reports experiencing 6/10 neck pain at this time, pt does not request any prn medications. Pt denies experiencing any SI/HI, or AVH at this time.  ? ?A: Scheduled medications administered to pt, per MD orders. Support and encouragement provided. Frequent verbal contact made. Routine safety checks conducted q15 minutes.  ? ?R: No adverse drug reactions noted. Pt verbally contracts for safety at this time. Pt compliant with medications. Pt interacts well with others on the unit. Pt remains safe at this time. Will continue to monitor.  ?Problem: Education: ?Goal: Emotional status will improve ?Outcome: Not Progressing ?Goal: Mental status will improve ?Outcome: Not Progressing ?  ?

## 2022-01-22 NOTE — Progress Notes (Signed)
[facility] MD Progress Note ? ?01/22/2022 11:02 AM ?Autumn [name]  ?MRN:  [id number] ?Subjective: Patient seen and chart reviewed.  Patient came to treatment team today.  Patient seems more stable than last time she was here.  She is mostly able to hold a lucid conversation and stay on track.  She denies any current hallucinations and denies suicidal ideation.  Still showing some disorganized thinking.  Social work reports that yesterday in a therapy group the patient spontaneously started talking about inappropriate information about sexual practices and had to be redirected.  Not aggressive with anyone else however.  Mostly stays to herself.  Drug screen did come back negative this time so if she had been back to using cocaine or amphetamines it was not as much as it could have been. ?Principal Problem: Bipolar 1 disorder ([place]) ?Diagnosis: Principal Problem: ?  Bipolar 1 disorder ([place]) ?Active Problems: ?  Amphetamine use disorder, severe ([place]) ?  Opioid use disorder, severe, dependence ([place]) ?  Cannabis use disorder, severe, dependence ([place]) ?  Opiate abuse, continuous ([place]) ?  Cocaine abuse ([place]) ? ?Total Time spent with patient: 30 minutes ? ?Past Psychiatric History: Past history of recurrent mood instability accompanied by psychosis as well as chronic substance abuse problems ? ?Past Medical History:  ?Past Medical History:  ?Diagnosis Date  ? Allergy   ? Seasonal, Ultram (seizures)  ? Anemia 2005  ? Bipolar 1 disorder ([place])   ? Glaucoma   ? Seizures ([place]) 2008  ?  ?Past Surgical History:  ?Procedure Laterality Date  ? BREAST EXCISIONAL BIOPSY Right   ? x 2  ? CESAREAN SECTION    ? x 2  ? LEEP  2000  ? ?Family History:  ?Family History  ?Problem Relation Age of Onset  ? Hypertension Mother   ? Hyperlipidemia Mother   ? Asthma Mother   ? Parkinson's disease Father   ? Lung cancer Maternal Grandmother   ? Other Paternal Grandfather 53  ?     Cardiac arrest  ? Heart disease Paternal Grandfather   ? Tourette syndrome  Daughter   ? ?Family Psychiatric  History: See previous ?Social History:  ?Social History  ? ?Substance and Sexual Activity  ?Alcohol Use Yes  ? Alcohol/week: 4.0 standard drinks  ? Types: 4 Cans of beer per week  ? Comment: Ocassional  ?   ?Social History  ? ?Substance and Sexual Activity  ?Drug Use Yes  ? Types: Marijuana  ? Comment: percocet -last use over 1 week ago. Heroin-last used 2/11  ?  ?Social History  ? ?Socioeconomic History  ? Marital status: Legally Separated  ?  Spouse name: Not on file  ? Number of children: Not on file  ? Years of education: Not on file  ? Highest education level: Not on file  ?Occupational History  ? Not on file  ?Tobacco Use  ? Smoking status: Every Day  ?  Packs/day: 1.00  ?  Years: 9.00  ?  Pack years: 9.00  ?  Types: E-cigarettes, Cigarettes  ? Smokeless tobacco: Never  ?Vaping Use  ? Vaping Use: Never used  ?Substance and Sexual Activity  ? Alcohol use: Yes  ?  Alcohol/week: 4.0 standard drinks  ?  Types: 4 Cans of beer per week  ?  Comment: Ocassional  ? Drug use: Yes  ?  Types: Marijuana  ?  Comment: percocet -last use over 1 week ago. Heroin-last used 2/11  ? Sexual activity: Yes  ?  Birth  control/protection: None  ?Other Topics Concern  ? Not on file  ?Social History Narrative  ? Not on file  ? ?Social Determinants of Health  ? ?Financial Resource Strain: Not on file  ?Food Insecurity: Not on file  ?Transportation Needs: Not on file  ?Physical Activity: Not on file  ?Stress: Not on file  ?Social Connections: Not on file  ? ?Additional Social History:  ?  ?  ?  ?  ?  ?  ?  ?  ?  ?  ?  ? ?Sleep: Fair ? ?Appetite:  Fair ? ?Current Medications: ?Current Facility-Administered Medications  ?Medication Dose Route Frequency Provider Last Rate Last Admin  ? acetaminophen (TYLENOL) tablet 650 mg  650 mg Oral Q6H PRN Sherlon Handing, NP   650 mg at 01/21/22 2119  ? alum & mag hydroxide-simeth (MAALOX/MYLANTA) 200-200-20 MG/5ML suspension 30 mL  30 mL Oral Q4H PRN Sherlon Handing, NP      ? ARIPiprazole (ABILIFY) tablet 15 mg  15 mg Oral Daily Tanashia Ciesla, [name], MD   15 mg at 01/22/22 0816  ? divalproex (DEPAKOTE) DR tablet 750 mg  750 mg Oral Q12H Waldon Merl F, NP   750 mg at 01/22/22 0816  ? feeding supplement (ENSURE ENLIVE / ENSURE PLUS) liquid 237 mL  237 mL Oral BID BM Aloria Looper, [name], MD   237 mL at 01/22/22 0924  ? lithium carbonate (LITHOBID) CR tablet 600 mg  600 mg Oral Q12H Waldon Merl F, NP   600 mg at 01/22/22 5277  ? magnesium hydroxide (MILK OF MAGNESIA) suspension 30 mL  30 mL Oral Daily PRN Waldon Merl F, NP      ? nicotine (NICODERM CQ - dosed in mg/24 hours) patch 21 mg  21 mg Transdermal Daily Waldon Merl F, NP   21 mg at 01/22/22 8242  ? QUEtiapine (SEROQUEL) tablet 300 mg  300 mg Oral QHS Atilano Covelli, [name], MD   300 mg at 01/21/22 2118  ? ? ?Lab Results: No results found for this or any previous visit (from the past 48 hour(s)). ? ?Blood Alcohol level:  ?Lab Results  ?Component Value Date  ? ETH <10 01/19/2022  ? ETH <10 12/24/2021  ? ? ?Metabolic Disorder Labs: ?Lab Results  ?Component Value Date  ? HGBA1C 4.9 12/25/2021  ? MPG 93.93 12/25/2021  ? MPG 114 06/02/2021  ? ?No results found for: PROLACTIN ?Lab Results  ?Component Value Date  ? CHOL 160 12/25/2021  ? TRIG 95 12/25/2021  ? HDL 58 12/25/2021  ? CHOLHDL 2.8 12/25/2021  ? VLDL 19 12/25/2021  ? [place] 83 12/25/2021  ? [place] 63 06/01/2021  ? ? ?Physical Findings: ?AIMS:  , ,  ,  ,    ?CIWA:  CIWA-Ar Total: 2 ?COWS:    ? ?Musculoskeletal: ?Strength & Muscle Tone: within normal limits ?Gait & Station: normal ?Patient leans: N/A ? ?Psychiatric Specialty Exam: ? ?Presentation  ?General Appearance: Bizarre; Disheveled ? ?Eye Contact:Good ? ?Speech:Pressured; Slurred ? ?Speech Volume:Decreased ? ?Handedness:Right ? ? ?Mood and Affect  ?Mood:Euphoric ? ?Affect:Inappropriate; Full Range ? ? ?Thought Process  ?Thought Processes:Disorganized ? ?Descriptions of  Associations:Tangential ? ?Orientation:Partial ? ?Thought Content:Obsessions; Paranoid Ideation; Illogical ? ?History of Schizophrenia/Schizoaffective disorder:No ? ?Duration of Psychotic Symptoms:Greater than six months ? ?Hallucinations:No data recorded ?Ideas of Reference:Delusions ? ?Suicidal Thoughts:No data recorded ?Homicidal Thoughts:No data recorded ? ?Sensorium  ?Memory:Immediate Poor; Recent Poor; Remote Poor ? ?Judgment:Poor ? ?Insight:Poor ? ? ?Executive Functions  ?Concentration:Poor ? ?  Attention Span:Poor ? ?Recall:Poor ? ?Fund of Knowledge:Poor ? ?Language:Poor ? ? ?Psychomotor Activity  ?Psychomotor Activity:No data recorded ? ?Assets  ?Assets:Communication Skills; Desire for Improvement; Resilience; Social Support ? ? ?Sleep  ?Sleep:No data recorded ? ? ?Physical Exam: ?Physical Exam ?Vitals and nursing note reviewed.  ?Constitutional:   ?   Appearance: Normal appearance.  ?HENT:  ?   Head: Normocephalic and atraumatic.  ?   Mouth/Throat:  ?   Pharynx: Oropharynx is clear.  ?Eyes:  ?   Pupils: Pupils are equal, round, and reactive to light.  ?Cardiovascular:  ?   Rate and Rhythm: Normal rate and regular rhythm.  ?Pulmonary:  ?   Effort: Pulmonary effort is normal.  ?   Breath sounds: Normal breath sounds.  ?Abdominal:  ?   General: Abdomen is flat.  ?   Palpations: Abdomen is soft.  ?Musculoskeletal:     ?   General: Normal range of motion.  ?Skin: ?   General: Skin is warm and dry.  ?Neurological:  ?   General: No focal deficit present.  ?   Mental Status: She is alert. Mental status is at baseline.  ?Psychiatric:     ?   Attention and Perception: Attention normal.     ?   Mood and Affect: Mood is anxious. Affect is blunt.     ?   Speech: Speech normal.     ?   Behavior: Behavior is cooperative.     ?   Thought Content: Thought content normal.     ?   Cognition and Memory: Cognition is impaired.     ?   Judgment: Judgment normal.  ? ?Review of Systems  ?Constitutional: Negative.   ?HENT: Negative.     ?Eyes: Negative.   ?Respiratory: Negative.    ?Cardiovascular: Negative.   ?Gastrointestinal: Negative.   ?Musculoskeletal: Negative.   ?Skin: Negative.   ?Neurological: Negative.   ?Psychiatric/Behavioral:  Positive for substance abuse.

## 2022-01-22 NOTE — BH IP Treatment Plan (Signed)
Interdisciplinary Treatment and Diagnostic Plan Update ? ?01/22/2022 ?Time of Session: 9:30 AM ?Autumn [name] ?MRN: [id number] ? ?Principal Diagnosis: Bipolar 1 disorder ([place]) ? ?Secondary Diagnoses: Principal Problem: ?  Bipolar 1 disorder ([place]) ?Active Problems: ?  Amphetamine use disorder, severe ([place]) ?  Opioid use disorder, severe, dependence ([place]) ?  Cannabis use disorder, severe, dependence ([place]) ?  Opiate abuse, continuous ([place]) ?  Cocaine abuse ([place]) ? ? ?Current Medications:  ?Current Facility-Administered Medications  ?Medication Dose Route Frequency Provider Last Rate Last Admin  ? acetaminophen (TYLENOL) tablet 650 mg  650 mg Oral Q6H PRN Sherlon Handing, NP   650 mg at 01/21/22 2119  ? alum & mag hydroxide-simeth (MAALOX/MYLANTA) 200-200-20 MG/5ML suspension 30 mL  30 mL Oral Q4H PRN Sherlon Handing, NP      ? ARIPiprazole (ABILIFY) tablet 15 mg  15 mg Oral Daily Clapacs, [name], MD   15 mg at 01/22/22 0816  ? divalproex (DEPAKOTE) DR tablet 750 mg  750 mg Oral Q12H Waldon Merl F, NP   750 mg at 01/22/22 0816  ? feeding supplement (ENSURE ENLIVE / ENSURE PLUS) liquid 237 mL  237 mL Oral BID BM Clapacs, [name], MD   237 mL at 01/22/22 1446  ? lithium carbonate (LITHOBID) CR tablet 600 mg  600 mg Oral Q12H Waldon Merl F, NP   600 mg at 01/22/22 1740  ? magnesium hydroxide (MILK OF MAGNESIA) suspension 30 mL  30 mL Oral Daily PRN Waldon Merl F, NP      ? nicotine (NICODERM CQ - dosed in mg/24 hours) patch 21 mg  21 mg Transdermal Daily Waldon Merl F, NP   21 mg at 01/22/22 8144  ? QUEtiapine (SEROQUEL) tablet 300 mg  300 mg Oral QHS Clapacs, [name], MD   300 mg at 01/21/22 2118  ? ?PTA Medications: ?Medications Prior to Admission  ?Medication Sig Dispense Refill Last Dose  ? ARIPiprazole (ABILIFY) 30 MG tablet Take 1 tablet (30 mg total) by mouth daily. 30 tablet 1   ? divalproex (DEPAKOTE) 250 MG DR tablet Take 3 tablets (750 mg total) by mouth every 12 (twelve) hours. 180 tablet  1   ? hydrOXYzine (ATARAX) 50 MG tablet Take 1 tablet (50 mg total) by mouth every 6 (six) hours as needed for anxiety. 60 tablet 1   ? lithium carbonate (LITHOBID) 300 MG CR tablet Take 2 tablets (600 mg total) by mouth every 12 (twelve) hours. 120 tablet 1   ? nicotine (NICODERM CQ - DOSED IN MG/24 HOURS) 21 mg/24hr patch Place 1 patch (21 mg total) onto the skin daily at 6 (six) AM. 28 patch 1   ? QUEtiapine 150 MG TABS Take 150 mg by mouth at bedtime. 30 tablet 1   ? ? ?Patient Stressors: Financial difficulties   ?Medication change or noncompliance   ?Substance abuse   ?Traumatic event   ? ?Patient Strengths: Average or above average intelligence  ?Communication skills  ?Physical Health  ? ?Treatment Modalities: Medication Management, Group therapy, Case management,  ?1 to 1 session with clinician, Psychoeducation, Recreational therapy. ? ? ?Physician Treatment Plan for Primary Diagnosis: Bipolar 1 disorder ([place]) ?Long Term Goal(s): Improvement in symptoms so as ready for discharge  ? ?Short Term Goals: Compliance with prescribed medications will improve ?Ability to identify triggers associated with substance abuse/mental health issues will improve ?Ability to verbalize feelings will improve ?Ability to disclose and discuss suicidal ideas ?Ability to demonstrate self-control will improve ? ?Medication  Management: Evaluate patient's response, side effects, and tolerance of medication regimen. ? ?Therapeutic Interventions: 1 to 1 sessions, Unit Group sessions and Medication administration. ? ?Evaluation of Outcomes: Progressing ? ?Physician Treatment Plan for Secondary Diagnosis: Principal Problem: ?  Bipolar 1 disorder ([place]) ?Active Problems: ?  Amphetamine use disorder, severe ([place]) ?  Opioid use disorder, severe, dependence ([place]) ?  Cannabis use disorder, severe, dependence ([place]) ?  Opiate abuse, continuous ([place]) ?  Cocaine abuse ([place]) ? ?Long Term Goal(s): Improvement in symptoms so as ready for discharge   ? ?Short Term Goals: Compliance with prescribed medications will improve ?Ability to identify triggers associated with substance abuse/mental health issues will improve ?Ability to verbalize feelings will improve ?Ability to disclose and discuss suicidal ideas ?Ability to demonstrate self-control will improve    ? ?Medication Management: Evaluate patient's response, side effects, and tolerance of medication regimen. ? ?Therapeutic Interventions: 1 to 1 sessions, Unit Group sessions and Medication administration. ? ?Evaluation of Outcomes: Progressing ? ? ?RN Treatment Plan for Primary Diagnosis: Bipolar 1 disorder ([place]) ?Long Term Goal(s): Knowledge of disease and therapeutic regimen to maintain health will improve ? ?Short Term Goals: Ability to demonstrate self-control, Ability to participate in decision making will improve, Ability to verbalize feelings will improve, Ability to disclose and discuss suicidal ideas, Ability to identify and develop effective coping behaviors will improve, and Compliance with prescribed medications will improve ? ?Medication Management: RN will administer medications as ordered by provider, will assess and evaluate patient's response and provide education to patient for prescribed medication. RN will report any adverse and/or side effects to prescribing provider. ? ?Therapeutic Interventions: 1 on 1 counseling sessions, Psychoeducation, Medication administration, Evaluate responses to treatment, Monitor vital signs and CBGs as ordered, Perform/monitor CIWA, COWS, AIMS and Fall Risk screenings as ordered, Perform wound care treatments as ordered. ? ?Evaluation of Outcomes: Progressing ? ? ?LCSW Treatment Plan for Primary Diagnosis: Bipolar 1 disorder ([place]) ?Long Term Goal(s): Safe transition to appropriate next level of care at discharge, Engage patient in therapeutic group addressing interpersonal concerns. ? ?Short Term Goals: Engage patient in aftercare planning with referrals and  resources, Increase social support, Increase ability to appropriately verbalize feelings, Increase emotional regulation, Facilitate acceptance of mental health diagnosis and concerns, and Increase skills for wellness and recovery ? ?Therapeutic Interventions: Assess for all discharge needs, 1 to 1 time with Education officer, museum, Explore available resources and support systems, Assess for adequacy in community support network, Educate family and significant other(s) on suicide prevention, Complete Psychosocial Assessment, Interpersonal group therapy. ? ?Evaluation of Outcomes: Progressing ? ? ?Progress in Treatment: ?Attending groups: Yes. ?Participating in groups: Yes. ?Taking medication as prescribed: Yes. ?Toleration medication: Yes. ?Family/Significant other contact made: No, will contact:  once permission is given. ?Patient understands diagnosis: Yes. ?Discussing patient identified problems/goals with staff: Yes. ?Medical problems stabilized or resolved: Yes. ?Denies suicidal/homicidal ideation: Yes. ?Issues/concerns per patient self-inventory: No. ?Other: none ? ?New problem(s) identified: No, Describe:  none ? ?New Short Term/Long Term Goal(s): elimination of symptoms of psychosis, medication management for mood stabilization; elimination of SI thoughts; development of comprehensive mental wellness/sobriety plan.  ? ?Patient Goals:  "attend group and get as much out of it as I can" ? ?Discharge Plan or Barriers: Patient reports that she is unable to return to her home and needs assistance in locating somewhere else to live at discharge.  She is requesting assistance in getting in at the homeless shelter in [facility], [employer].  CSW to assist.   ? ?  Reason for Continuation of Hospitalization: Anxiety ?Depression ?Hallucinations ?Medication stabilization ? ?Estimated Length of Stay:  1-7 days ? ? ?Scribe for Treatment Team: ?Rozann Lesches, LCSW ?01/22/2022 ?2:47 PM ?

## 2022-01-22 NOTE — Group Note (Signed)
[facility] LCSW Group Therapy Note ? ? ?Group Date: 01/22/2022 ?Start Time: 1300 ?End Time: 1400 ? ? ?Type of Therapy/Topic:  Group Therapy:  Emotion Regulation ? ?Participation Level:  Minimal  ? ?Mood: ? ?Description of Group:   ? The purpose of this group is to assist patients in learning to regulate negative emotions and experience positive emotions. Patients will be guided to discuss ways in which they have been vulnerable to their negative emotions. These vulnerabilities will be juxtaposed with experiences of positive emotions or situations, and patients challenged to use positive emotions to combat negative ones. Special emphasis will be placed on coping with negative emotions in conflict situations, and patients will process healthy conflict resolution skills. ? ?Therapeutic Goals: ?Patient will identify two positive emotions or experiences to reflect on in order to balance out negative emotions:  ?Patient will label two or more emotions that they find the most difficult to experience:  ?Patient will be able to demonstrate positive conflict resolution skills through discussion or role plays:  ? ?Summary of Patient Progress: ?Patient was present at the beginning of group.  Patient shared that she has felt "frightened, scared and angry recently".  Patient left before she could elaborate further.  Patient left group early and did not return.  ? ?Therapeutic Modalities:   ?Cognitive Behavioral Therapy ?Feelings Identification ?Dialectical Behavioral Therapy ? ? ?Rozann Lesches, LCSW ?

## 2022-01-22 NOTE — Progress Notes (Signed)
Patient received her QHS  medication and Tylenol for pain. Received without incident. Pleasant and cooperative and easy to engage in conversation.  She denies si  hi depression.  She does endorse avh and anxiety at this encounter and jokes about her mental health "following her around."  She is active on the unit and is at times intrusive with both staff and her peers.  She is easily redirectable with o real behavior issues to note.  Will continue to monitor with q 15 minute safety checks.   ? ?C Butler-Nicholson, LPN ?

## 2022-01-22 NOTE — Progress Notes (Signed)
Patient approached nurses station requesting pain medication for both mouth pain and menstrual cramps. Advised that it was too early and explained that the medication is set to be given every 6 hours.  Patient was understanding and accepting.  ? ?C Butler-Nicholson, LPN ?

## 2022-01-23 DIAGNOSIS — F319 Bipolar disorder, unspecified: Secondary | ICD-10-CM | POA: Diagnosis not present

## 2022-01-23 NOTE — Progress Notes (Signed)
[facility] MD Progress Note ? ?01/23/2022 1:50 PM ?Autumn [name]  ?MRN:  [id number] ?Subjective: Patient seen and chart reviewed.  She is calm and cooperative, reports feeling better.  Tolerates meds, eating and sleeping well. Denied SI/H/AVH ? ? ?Principal Problem: Bipolar 1 disorder ([place]) ?Diagnosis: Principal Problem: ?  Bipolar 1 disorder ([place]) ?Active Problems: ?  Amphetamine use disorder, severe ([place]) ?  Opioid use disorder, severe, dependence ([place]) ?  Cannabis use disorder, severe, dependence ([place]) ?  Opiate abuse, continuous ([place]) ?  Cocaine abuse ([place]) ? ?Total Time spent with patient: 20 minutes ? ?Past Psychiatric History: Past history of recurrent mood instability accompanied by psychosis as well as chronic substance abuse problems ? ?Past Medical History:  ?Past Medical History:  ?Diagnosis Date  ? Allergy   ? Seasonal, Ultram (seizures)  ? Anemia 2005  ? Bipolar 1 disorder ([place])   ? Glaucoma   ? Seizures ([place]) 2008  ?  ?Past Surgical History:  ?Procedure Laterality Date  ? BREAST EXCISIONAL BIOPSY Right   ? x 2  ? CESAREAN SECTION    ? x 2  ? LEEP  2000  ? ?Family History:  ?Family History  ?Problem Relation Age of Onset  ? Hypertension Mother   ? Hyperlipidemia Mother   ? Asthma Mother   ? Parkinson's disease Father   ? Lung cancer Maternal Grandmother   ? Other Paternal Grandfather 108  ?     Cardiac arrest  ? Heart disease Paternal Grandfather   ? Tourette syndrome Daughter   ? ?Family Psychiatric  History: See previous ?Social History:  ?Social History  ? ?Substance and Sexual Activity  ?Alcohol Use Yes  ? Alcohol/week: 4.0 standard drinks  ? Types: 4 Cans of beer per week  ? Comment: Ocassional  ?   ?Social History  ? ?Substance and Sexual Activity  ?Drug Use Yes  ? Types: Marijuana  ? Comment: percocet -last use over 1 week ago. Heroin-last used 2/11  ?  ?Social History  ? ?Socioeconomic History  ? Marital status: Legally Separated  ?  Spouse name: Not on file  ? Number of children: Not on file  ? Years  of education: Not on file  ? Highest education level: Not on file  ?Occupational History  ? Not on file  ?Tobacco Use  ? Smoking status: Every Day  ?  Packs/day: 1.00  ?  Years: 9.00  ?  Pack years: 9.00  ?  Types: E-cigarettes, Cigarettes  ? Smokeless tobacco: Never  ?Vaping Use  ? Vaping Use: Never used  ?Substance and Sexual Activity  ? Alcohol use: Yes  ?  Alcohol/week: 4.0 standard drinks  ?  Types: 4 Cans of beer per week  ?  Comment: Ocassional  ? Drug use: Yes  ?  Types: Marijuana  ?  Comment: percocet -last use over 1 week ago. Heroin-last used 2/11  ? Sexual activity: Yes  ?  Birth control/protection: None  ?Other Topics Concern  ? Not on file  ?Social History Narrative  ? Not on file  ? ?Social Determinants of Health  ? ?Financial Resource Strain: Not on file  ?Food Insecurity: Not on file  ?Transportation Needs: Not on file  ?Physical Activity: Not on file  ?Stress: Not on file  ?Social Connections: Not on file  ? ?Additional Social History:  ?  ? ?Sleep: Fair ? ?Appetite:  Fair ? ?Current Medications: ?Current Facility-Administered Medications  ?Medication Dose Route Frequency Provider Last Rate Last Admin  ?  acetaminophen (TYLENOL) tablet 650 mg  650 mg Oral Q6H PRN Sherlon Handing, NP   650 mg at 01/22/22 1750  ? alum & mag hydroxide-simeth (MAALOX/MYLANTA) 200-200-20 MG/5ML suspension 30 mL  30 mL Oral Q4H PRN Sherlon Handing, NP      ? ARIPiprazole (ABILIFY) tablet 15 mg  15 mg Oral Daily Clapacs, [name], MD   15 mg at 01/23/22 0834  ? divalproex (DEPAKOTE) DR tablet 750 mg  750 mg Oral Q12H Waldon Merl F, NP   750 mg at 01/23/22 3329  ? feeding supplement (ENSURE ENLIVE / ENSURE PLUS) liquid 237 mL  237 mL Oral BID BM Clapacs, [name], MD   237 mL at 01/23/22 0930  ? lithium carbonate (LITHOBID) CR tablet 600 mg  600 mg Oral Q12H Waldon Merl F, NP   600 mg at 01/23/22 5188  ? magnesium hydroxide (MILK OF MAGNESIA) suspension 30 mL  30 mL Oral Daily PRN Waldon Merl F, NP      ?  nicotine (NICODERM CQ - dosed in mg/24 hours) patch 21 mg  21 mg Transdermal Daily Waldon Merl F, NP   21 mg at 01/23/22 4166  ? QUEtiapine (SEROQUEL) tablet 300 mg  300 mg Oral QHS Clapacs, [name], MD   300 mg at 01/22/22 2116  ? ? ?Lab Results: No results found for this or any previous visit (from the past 48 hour(s)). ? ?Blood Alcohol level:  ?Lab Results  ?Component Value Date  ? ETH <10 01/19/2022  ? ETH <10 12/24/2021  ? ? ?Metabolic Disorder Labs: ?Lab Results  ?Component Value Date  ? HGBA1C 4.9 12/25/2021  ? MPG 93.93 12/25/2021  ? MPG 114 06/02/2021  ? ?No results found for: PROLACTIN ?Lab Results  ?Component Value Date  ? CHOL 160 12/25/2021  ? TRIG 95 12/25/2021  ? HDL 58 12/25/2021  ? CHOLHDL 2.8 12/25/2021  ? VLDL 19 12/25/2021  ? [place] 83 12/25/2021  ? [place] 63 06/01/2021  ? ? ?Physical Findings: ?AIMS:  , ,  ,  ,    ?CIWA:  CIWA-Ar Total: 2 ?COWS:    ? ?Musculoskeletal: ?Strength & Muscle Tone: within normal limits ?Gait & Station: normal ?Patient leans: N/A ? ?Psychiatric Specialty Exam: ? ?Presentation  ?General Appearance: Bizarre; Disheveled ? ?Eye Contact:Good ? ?Speech:Pressured; Slurred ? ?Speech Volume:Decreased ? ?Handedness:Right ? ? ?Mood and Affect  ?Mood:Euphoric ? ?Affect:Inappropriate; Full Range ? ? ?Thought Process  ?Thought Processes:Disorganized ? ?Descriptions of Associations:Tangential ? ?Orientation:Partial ? ?Thought Content:Obsessions; Paranoid Ideation; Illogical ? ?History of Schizophrenia/Schizoaffective disorder:No ? ?Duration of Psychotic Symptoms:Greater than six months ? ?Hallucinations:No data recorded ?Ideas of Reference:Delusions ? ?Suicidal Thoughts:No data recorded ?Homicidal Thoughts:No data recorded ? ?Sensorium  ?Memory:Immediate Poor; Recent Poor; Remote Poor ? ?Judgment:Poor ? ?Insight:Poor ? ? ?Executive Functions  ?Concentration:Poor ? ?Attention Span:Poor ? ?Recall:Poor ? ?Fund of Knowledge:Poor ? ?Language:Poor ? ? ?Psychomotor Activity   ?Psychomotor Activity:No data recorded ? ?Assets  ?Assets:Communication Skills; Desire for Improvement; Resilience; Social Support ? ? ?Sleep  ?Sleep:No data recorded ? ? ?Physical Exam: ?Physical Exam ?Vitals and nursing note reviewed.  ?Constitutional:   ?   Appearance: Normal appearance.  ?HENT:  ?   Head: Normocephalic and atraumatic.  ?   Mouth/Throat:  ?   Pharynx: Oropharynx is clear.  ?Eyes:  ?   Pupils: Pupils are equal, round, and reactive to light.  ?Cardiovascular:  ?   Rate and Rhythm: Normal rate and regular rhythm.  ?Pulmonary:  ?  Effort: Pulmonary effort is normal.  ?   Breath sounds: Normal breath sounds.  ?Abdominal:  ?   General: Abdomen is flat.  ?   Palpations: Abdomen is soft.  ?Musculoskeletal:     ?   General: Normal range of motion.  ?Skin: ?   General: Skin is warm and dry.  ?Neurological:  ?   General: No focal deficit present.  ?   Mental Status: She is alert. Mental status is at baseline.  ?Psychiatric:     ?   Attention and Perception: Attention normal.     ?   Mood and Affect: Mood is anxious. Affect is blunt.     ?   Speech: Speech normal.     ?   Behavior: Behavior is cooperative.     ?   Thought Content: Thought content normal.     ?   Cognition and Memory: Cognition is impaired.     ?   Judgment: Judgment normal.  ? ?Review of Systems  ?Constitutional: Negative.   ?HENT: Negative.    ?Eyes: Negative.   ?Respiratory: Negative.    ?Cardiovascular: Negative.   ?Gastrointestinal: Negative.   ?Musculoskeletal: Negative.   ?Skin: Negative.   ?Neurological: Negative.   ?Psychiatric/Behavioral:  Positive for substance abuse. Negative for depression, hallucinations, memory loss and suicidal ideas. The patient is nervous/anxious. The patient does not have insomnia.   ?Blood pressure 104/62, pulse 83, temperature 97.9 ?F (36.6 ?C), temperature source Oral, resp. rate 18, height '5\' 1"'$  (1.549 m), weight 46.3 kg, SpO2 100 %. Body mass index is 19.27 kg/m?. ? ? ?Treatment Plan Summary: ?Daily  contact with patient to assess and evaluate symptoms and progress in treatment and Medication management ? ?Bipolar I, with psychotic features ?-- continue Lithium '600mg'$  BID (will check level tomorrow)  ?-- conti

## 2022-01-23 NOTE — Group Note (Addendum)
[facility] LCSW Group Therapy Note ? ? ?Group Date: 01/23/2022 ?Start Time: 1320 ?End Time: 3888 ? ? ?Type of Therapy and Topic: Group Therapy: Avoiding Self-Sabotaging and Enabling Behaviors ? ?Participation Level: Active ? ?Description of Group:  ?In this group, patients will learn how to identify obstacles, self-sabotaging and enabling behaviors, as well as: what are they, why do we do them and what needs these behaviors meet. Discuss unhealthy relationships and how to have positive healthy boundaries with those that sabotage and enable. Explore aspects of self-sabotage and enabling in yourself and how to limit these self-destructive behaviors in everyday life. ? ? ?Therapeutic Goals: ?1. Patient will identify one obstacle that relates to self-sabotage and enabling behaviors ?2. Patient will identify one personal self-sabotaging or enabling behavior they did prior to admission ?3. Patient will state a plan to change the above identified behavior ?4. Patient will demonstrate ability to communicate their needs through discussion and/or role play.  ? ? ?Summary of Patient Progress: Patient was present for the entirety of the group session. Patient was an active listener and participated in the topic of discussion and added nuance to topic of conversation. Patient shared how her recent drug use has led to her engaging in unsafe behaviors. Patient identified that she often is worried about "everyone but myself." Patient identified the homeless shelter as a positive support.  ? ? ? ? ?Therapeutic Modalities:  ?Cognitive Behavioral Therapy ?Person-Centered Therapy ?Motivational Interviewing ? ? ? ?Autumn [name], LCSWA ?

## 2022-01-23 NOTE — Plan of Care (Signed)
D: Patient alert and oriented, able to make needs known. Denies SI/HI, AVH at present. Denies pain at present. Patient goal today "group learning." Denies any feelings of depression or anxiety at present. Patient reports energy level as within her normal limits. She reports she slept well last night. Patient does not request any PRN medication at this time.  ? ?A: Scheduled medications administered to patient per MD order. Support and encouragement provided. Routine safety checks conducted every fifteen minutes. Patient informed to notify staff with problems or concerns. Frequent verbal contact made.  ? ?R: No adverse drug reactions noted. Patient contracts for safety at this time. Patient is compliant with medications and treatment plan. Patient receptive, calm and cooperative. Patient interacts with others appropriately on unit at present. Patient remains safe at present.  ? ? ?Problem: Education: ?Goal: Knowledge of [facility] Education information/materials will improve ?Outcome: Progressing ?Goal: Emotional status will improve ?Outcome: Progressing ?Goal: Mental status will improve ?Outcome: Progressing ?Goal: Verbalization of understanding the information provided will improve ?Outcome: Progressing ?  ?Problem: Health Behavior/Discharge Planning: ?Goal: Identification of resources available to assist in meeting health care needs will improve ?Outcome: Progressing ?Goal: Compliance with treatment plan for underlying cause of condition will improve ?Outcome: Progressing ?  ?Problem: Education: ?Goal: Utilization of techniques to improve thought processes will improve ?Outcome: Progressing ?Goal: Knowledge of the prescribed therapeutic regimen will improve ?Outcome: Progressing ?  ?Problem: Activity: ?Goal: Interest or engagement in leisure activities will improve ?Outcome: Progressing ?Goal: Imbalance in normal sleep/wake cycle will improve ?Outcome: Progressing ?  ?Problem: Coping: ?Goal: Coping  ability will improve ?Outcome: Progressing ?Goal: Will verbalize feelings ?Outcome: Progressing ?  ?Problem: Role Relationship: ?Goal: Will demonstrate positive changes in social behaviors and relationships ?Outcome: Progressing ?  ?Problem: Safety: ?Goal: Ability to disclose and discuss suicidal ideas will improve ?Outcome: Progressing ?Goal: Ability to identify and utilize support systems that promote safety will improve ?Outcome: Progressing ?  ?Problem: Self-Concept: ?Goal: Will verbalize positive feelings about self ?Outcome: Progressing ?Goal: Level of anxiety will decrease ?Outcome: Progressing ?  ?

## 2022-01-23 NOTE — Progress Notes (Signed)
Patient did not sleep well. At the nurses station asking for the time throughout the night. No behaviors to note. Will continue to monitor with q 15 minute safety checks.  ? ? ? ? ? ?C Butler-Nicholson, LPN  ?

## 2022-01-24 DIAGNOSIS — F319 Bipolar disorder, unspecified: Secondary | ICD-10-CM | POA: Diagnosis not present

## 2022-01-24 LAB — LITHIUM LEVEL: Lithium Lvl: 0.79 mmol/L (ref 0.60–1.20)

## 2022-01-24 LAB — VALPROIC ACID LEVEL: Valproic Acid Lvl: 87 ug/mL (ref 50.0–100.0)

## 2022-01-24 NOTE — Plan of Care (Signed)
?  Problem: Education: ?Goal: Knowledge of [facility] Education information/materials will improve ?Outcome: Progressing ?Goal: Emotional status will improve ?Outcome: Progressing ?Goal: Mental status will improve ?Outcome: Progressing ?Goal: Verbalization of understanding the information provided will improve ?Outcome: Progressing ?  ?Problem: Health Behavior/Discharge Planning: ?Goal: Identification of resources available to assist in meeting health care needs will improve ?Outcome: Progressing ?Goal: Compliance with treatment plan for underlying cause of condition will improve ?Outcome: Progressing ?  ?Problem: Education: ?Goal: Utilization of techniques to improve thought processes will improve ?Outcome: Progressing ?Goal: Knowledge of the prescribed therapeutic regimen will improve ?Outcome: Progressing ?  ?Problem: Activity: ?Goal: Interest or engagement in leisure activities will improve ?Outcome: Progressing ?Goal: Imbalance in normal sleep/wake cycle will improve ?Outcome: Progressing ?  ?Problem: Coping: ?Goal: Coping ability will improve ?Outcome: Progressing ?Goal: Will verbalize feelings ?Outcome: Progressing ?  ?Problem: Role Relationship: ?Goal: Will demonstrate positive changes in social behaviors and relationships ?Outcome: Progressing ?  ?Problem: Safety: ?Goal: Ability to disclose and discuss suicidal ideas will improve ?Outcome: Progressing ?Goal: Ability to identify and utilize support systems that promote safety will improve ?Outcome: Progressing ?  ?Problem: Self-Concept: ?Goal: Will verbalize positive feelings about self ?Outcome: Progressing ?Goal: Level of anxiety will decrease ?Outcome: Progressing ?  ?

## 2022-01-24 NOTE — Group Note (Signed)
LCSW Group Therapy Note ? ?Group Date: 01/24/2022 ?Start Time: 1315 ?End Time: 6861 ? ? ?Type of Therapy and Topic:  Group Therapy - Healthy vs Unhealthy Coping Skills ? ?Participation Level:  Did Not Attend  ? ?Description of Group ?The focus of this group was to determine what unhealthy coping techniques typically are used by group members and what healthy coping techniques would be helpful in coping with various problems. Patients were guided in becoming aware of the differences between healthy and unhealthy coping techniques. Patients were asked to identify 2-3 healthy coping skills they would like to learn to use more effectively. ? ?Therapeutic Goals ?Patients learned that coping is what human beings do all day long to deal with various situations in their lives ?Patients defined and discussed healthy vs unhealthy coping techniques ?Patients identified their preferred coping techniques and identified whether these were healthy or unhealthy ?Patients determined 2-3 healthy coping skills they would like to become more familiar with and use more often. ?Patients provided support and ideas to each other ? ? ?Summary of Patient Progress: Due to limited staffing, group was not held on the unit.  ? ? ?Therapeutic Modalities ?Cognitive Behavioral Therapy ?Motivational Interviewing ? ?Kenna Gilbert Raela Bohl, LCSWA ?01/24/2022  3:11 PM   ?

## 2022-01-24 NOTE — Plan of Care (Signed)
D: Pt alert and oriented. Pt denies experiencing any anxiety/depression at this time. Pt denies experiencing any pain at this time. Pt denies experiencing any SI/HI, or AVH at this time.  ? ?Pt did report this evening she was experiencing some pain in her right heel and believed it to be bruised. Pt requested prn tylenol which was given. During this time this patient stated that she was going to get her hair cut when she got to the shelter, that something's got to given. Pt wants her hair cut. ? ?A: Scheduled medications administered to pt, per MD orders. Support and encouragement provided. Frequent verbal contact made. Routine safety checks conducted q15 minutes.  ? ?R: No adverse drug reactions noted. Pt verbally contracts for safety at this time. Pt compliant with medications and treatment plan. Pt interacts well with others on the unit. Pt remains safe at this time. Will continue to monitor.  ?Problem: Education: ?Goal: Knowledge of [facility] Education information/materials will improve ?Outcome: Progressing ?Goal: Emotional status will improve ?Outcome: Progressing ?  ?

## 2022-01-24 NOTE — Progress Notes (Signed)
Patient has been less labile this shift. Hygiene is improved. Denies SI, HI and AVH ?

## 2022-01-24 NOTE — Progress Notes (Signed)
[facility] MD Progress Note ? ?01/24/2022 2:47 PM ?Autumn [name]  ?MRN:  [id number] ?Subjective: Patient seen and chart reviewed.   ?She reports feeling better mood wise and physically stronger, no dizziness.  ? ?She tolerates meds and no SI or Hi.  ? ? ?Principal Problem: Bipolar 1 disorder ([place]) ?Diagnosis: Principal Problem: ?  Bipolar 1 disorder ([place]) ?Active Problems: ?  Amphetamine use disorder, severe ([place]) ?  Opioid use disorder, severe, dependence ([place]) ?  Cannabis use disorder, severe, dependence ([place]) ?  Opiate abuse, continuous ([place]) ?  Cocaine abuse ([place]) ? ?Total Time spent with patient: 20 minutes ? ?Past Psychiatric History: Past history of recurrent mood instability accompanied by psychosis as well as chronic substance abuse problems ? ?Past Medical History:  ?Past Medical History:  ?Diagnosis Date  ? Allergy   ? Seasonal, Ultram (seizures)  ? Anemia 2005  ? Bipolar 1 disorder ([place])   ? Glaucoma   ? Seizures ([place]) 2008  ?  ?Past Surgical History:  ?Procedure Laterality Date  ? BREAST EXCISIONAL BIOPSY Right   ? x 2  ? CESAREAN SECTION    ? x 2  ? LEEP  2000  ? ?Family History:  ?Family History  ?Problem Relation Age of Onset  ? Hypertension Mother   ? Hyperlipidemia Mother   ? Asthma Mother   ? Parkinson's disease Father   ? Lung cancer Maternal Grandmother   ? Other Paternal Grandfather 13  ?     Cardiac arrest  ? Heart disease Paternal Grandfather   ? Tourette syndrome Daughter   ? ?Family Psychiatric  History: See previous ?Social History:  ?Social History  ? ?Substance and Sexual Activity  ?Alcohol Use Yes  ? Alcohol/week: 4.0 standard drinks  ? Types: 4 Cans of beer per week  ? Comment: Ocassional  ?   ?Social History  ? ?Substance and Sexual Activity  ?Drug Use Yes  ? Types: Marijuana  ? Comment: percocet -last use over 1 week ago. Heroin-last used 2/11  ?  ?Social History  ? ?Socioeconomic History  ? Marital status: Legally Separated  ?  Spouse name: Not on file  ? Number of children: Not on file  ?  Years of education: Not on file  ? Highest education level: Not on file  ?Occupational History  ? Not on file  ?Tobacco Use  ? Smoking status: Every Day  ?  Packs/day: 1.00  ?  Years: 9.00  ?  Pack years: 9.00  ?  Types: E-cigarettes, Cigarettes  ? Smokeless tobacco: Never  ?Vaping Use  ? Vaping Use: Never used  ?Substance and Sexual Activity  ? Alcohol use: Yes  ?  Alcohol/week: 4.0 standard drinks  ?  Types: 4 Cans of beer per week  ?  Comment: Ocassional  ? Drug use: Yes  ?  Types: Marijuana  ?  Comment: percocet -last use over 1 week ago. Heroin-last used 2/11  ? Sexual activity: Yes  ?  Birth control/protection: None  ?Other Topics Concern  ? Not on file  ?Social History Narrative  ? Not on file  ? ?Social Determinants of Health  ? ?Financial Resource Strain: Not on file  ?Food Insecurity: Not on file  ?Transportation Needs: Not on file  ?Physical Activity: Not on file  ?Stress: Not on file  ?Social Connections: Not on file  ? ?Additional Social History:  ?  ? ?Sleep: Fair ? ?Appetite:  Fair ? ?Current Medications: ?Current Facility-Administered Medications  ?Medication Dose Route Frequency Provider  Last Rate Last Admin  ? acetaminophen (TYLENOL) tablet 650 mg  650 mg Oral Q6H PRN Sherlon Handing, NP   650 mg at 01/23/22 1613  ? alum & mag hydroxide-simeth (MAALOX/MYLANTA) 200-200-20 MG/5ML suspension 30 mL  30 mL Oral Q4H PRN Sherlon Handing, NP      ? ARIPiprazole (ABILIFY) tablet 15 mg  15 mg Oral Daily Clapacs, [name], MD   15 mg at 01/24/22 1950  ? divalproex (DEPAKOTE) DR tablet 750 mg  750 mg Oral Q12H Waldon Merl F, NP   750 mg at 01/24/22 9326  ? feeding supplement (ENSURE ENLIVE / ENSURE PLUS) liquid 237 mL  237 mL Oral BID BM Clapacs, [name], MD   237 mL at 01/24/22 1344  ? lithium carbonate (LITHOBID) CR tablet 600 mg  600 mg Oral Q12H Waldon Merl F, NP   600 mg at 01/24/22 7124  ? magnesium hydroxide (MILK OF MAGNESIA) suspension 30 mL  30 mL Oral Daily PRN Waldon Merl F, NP       ? nicotine (NICODERM CQ - dosed in mg/24 hours) patch 21 mg  21 mg Transdermal Daily Waldon Merl F, NP   21 mg at 01/24/22 0855  ? QUEtiapine (SEROQUEL) tablet 300 mg  300 mg Oral QHS Clapacs, [name], MD   300 mg at 01/22/22 2116  ? ? ?Lab Results:  ?Results for orders placed or performed during the hospital encounter of 01/20/22 (from the past 48 hour(s))  ?Lithium level     Status: None  ? Collection Time: 01/24/22  5:16 AM  ?Result Value Ref Range  ? Lithium Lvl 0.79 0.60 - 1.20 mmol/L  ?  Comment: Performed at [facility], [address], [place], [place] [address]  ?Valproic acid level     Status: None  ? Collection Time: 01/24/22  5:16 AM  ?Result Value Ref Range  ? Valproic Acid Lvl 87 50.0 - 100.0 ug/mL  ?  Comment: Performed at [facility], [address] [place], [place] [address]  ? ? ?Blood Alcohol level:  ?Lab Results  ?Component Value Date  ? ETH <10 01/19/2022  ? ETH <10 12/24/2021  ? ? ?Metabolic Disorder Labs: ?Lab Results  ?Component Value Date  ? HGBA1C 4.9 12/25/2021  ? MPG 93.93 12/25/2021  ? MPG 114 06/02/2021  ? ?No results found for: PROLACTIN ?Lab Results  ?Component Value Date  ? CHOL 160 12/25/2021  ? TRIG 95 12/25/2021  ? HDL 58 12/25/2021  ? CHOLHDL 2.8 12/25/2021  ? VLDL 19 12/25/2021  ? [place] 83 12/25/2021  ? [place] 63 06/01/2021  ? ? ?Physical Findings: ?AIMS:  , ,  ,  ,    ?CIWA:  CIWA-Ar Total: 2 ?COWS:    ? ?Musculoskeletal: ?Strength & Muscle Tone: within normal limits ?Gait & Station: normal ?Patient leans: N/A ? ?Psychiatric Specialty Exam: ? ?Presentation  ?General Appearance: Bizarre; Disheveled ? ?Eye Contact:Good ? ?Speech:Pressured; Slurred ? ?Speech Volume:Decreased ? ?Handedness:Right ? ? ?Mood and Affect  ?Mood:Euphoric ? ?Affect:Inappropriate; Full Range ? ? ?Thought Process  ?Thought Processes:Disorganized ? ?Descriptions of Associations:Tangential ? ?Orientation:Partial ? ?Thought Content:Obsessions; Paranoid Ideation;  Illogical ? ?History of Schizophrenia/Schizoaffective disorder:No ? ?Duration of Psychotic Symptoms:Greater than six months ? ?Hallucinations:No data recorded ?Ideas of Reference:Delusions ? ?Suicidal Thoughts:No data recorded ?Homicidal Thoughts:No data recorded ? ?Sensorium  ?Memory:Immediate Poor; Recent Poor; Remote Poor ? ?Judgment:Poor ? ?Insight:Poor ? ? ?Executive Functions  ?Concentration:Poor ? ?Attention Span:Poor ? ?Recall:Poor ? ?Fund of Knowledge:Poor ? ?  Language:Poor ? ? ?Psychomotor Activity  ?Psychomotor Activity:No data recorded ? ?Assets  ?Assets:Communication Skills; Desire for Improvement; Resilience; Social Support ? ? ?Sleep  ?Sleep:No data recorded ? ? ?Physical Exam: ?Physical Exam ?Vitals and nursing note reviewed.  ?Constitutional:   ?   Appearance: Normal appearance.  ?HENT:  ?   Head: Normocephalic and atraumatic.  ?   Mouth/Throat:  ?   Pharynx: Oropharynx is clear.  ?Eyes:  ?   Pupils: Pupils are equal, round, and reactive to light.  ?Cardiovascular:  ?   Rate and Rhythm: Normal rate and regular rhythm.  ?Pulmonary:  ?   Effort: Pulmonary effort is normal.  ?   Breath sounds: Normal breath sounds.  ?Abdominal:  ?   General: Abdomen is flat.  ?   Palpations: Abdomen is soft.  ?Musculoskeletal:     ?   General: Normal range of motion.  ?Skin: ?   General: Skin is warm and dry.  ?Neurological:  ?   General: No focal deficit present.  ?   Mental Status: She is alert. Mental status is at baseline.  ?Psychiatric:     ?   Attention and Perception: Attention normal.     ?   Mood and Affect: Mood is anxious. Affect is blunt.     ?   Speech: Speech normal.     ?   Behavior: Behavior is cooperative.     ?   Thought Content: Thought content normal.     ?   Cognition and Memory: Cognition is impaired.     ?   Judgment: Judgment normal.  ? ?Review of Systems  ?Constitutional: Negative.   ?HENT: Negative.    ?Eyes: Negative.   ?Respiratory: Negative.    ?Cardiovascular: Negative.   ?Gastrointestinal:  Negative.   ?Musculoskeletal: Negative.   ?Skin: Negative.   ?Neurological: Negative.   ?Psychiatric/Behavioral:  Positive for substance abuse. Negative for depression, hallucinations, memory loss and suicidal idea

## 2022-01-25 DIAGNOSIS — F319 Bipolar disorder, unspecified: Secondary | ICD-10-CM | POA: Diagnosis not present

## 2022-01-25 MED ORDER — METRONIDAZOLE 500 MG PO TABS
2000.0000 mg | ORAL_TABLET | Freq: Once | ORAL | Status: AC
  Administered 2022-01-25: 2000 mg via ORAL
  Filled 2022-01-25: qty 4

## 2022-01-25 NOTE — Progress Notes (Signed)
[facility] MD Progress Note ? ?01/25/2022 2:50 PM ?Autumn [name]  ?MRN:  [id number] ?Subjective: Follow-up for 46 year old woman with bipolar disorder.  Patient continues to be calm and appropriate on the unit.  Not acting out aggressively.  Denies suicidal ideation.  Able to hold lucid conversations.  She did complain today of having a foul vaginal odor which she identified as trichomoniasis from her experience. ?Principal Problem: Bipolar 1 disorder ([place]) ?Diagnosis: Principal Problem: ?  Bipolar 1 disorder ([place]) ?Active Problems: ?  Amphetamine use disorder, severe ([place]) ?  Opioid use disorder, severe, dependence ([place]) ?  Cannabis use disorder, severe, dependence ([place]) ?  Opiate abuse, continuous ([place]) ?  Cocaine abuse ([place]) ? ?Total Time spent with patient: 30 minutes ? ?Past Psychiatric History: Past history of bipolar disorder frequently psychotic with substance abuse ? ?Past Medical History:  ?Past Medical History:  ?Diagnosis Date  ? Allergy   ? Seasonal, Ultram (seizures)  ? Anemia 2005  ? Bipolar 1 disorder ([place])   ? Glaucoma   ? Seizures ([place]) 2008  ?  ?Past Surgical History:  ?Procedure Laterality Date  ? BREAST EXCISIONAL BIOPSY Right   ? x 2  ? CESAREAN SECTION    ? x 2  ? LEEP  2000  ? ?Family History:  ?Family History  ?Problem Relation Age of Onset  ? Hypertension Mother   ? Hyperlipidemia Mother   ? Asthma Mother   ? Parkinson's disease Father   ? Lung cancer Maternal Grandmother   ? Other Paternal Grandfather 13  ?     Cardiac arrest  ? Heart disease Paternal Grandfather   ? Tourette syndrome Daughter   ? ?Family Psychiatric  History: See previous ?Social History:  ?Social History  ? ?Substance and Sexual Activity  ?Alcohol Use Yes  ? Alcohol/week: 4.0 standard drinks  ? Types: 4 Cans of beer per week  ? Comment: Ocassional  ?   ?Social History  ? ?Substance and Sexual Activity  ?Drug Use Yes  ? Types: Marijuana  ? Comment: percocet -last use over 1 week ago. Heroin-last used 2/11  ?  ?Social History   ? ?Socioeconomic History  ? Marital status: Legally Separated  ?  Spouse name: Not on file  ? Number of children: Not on file  ? Years of education: Not on file  ? Highest education level: Not on file  ?Occupational History  ? Not on file  ?Tobacco Use  ? Smoking status: Every Day  ?  Packs/day: 1.00  ?  Years: 9.00  ?  Pack years: 9.00  ?  Types: E-cigarettes, Cigarettes  ? Smokeless tobacco: Never  ?Vaping Use  ? Vaping Use: Never used  ?Substance and Sexual Activity  ? Alcohol use: Yes  ?  Alcohol/week: 4.0 standard drinks  ?  Types: 4 Cans of beer per week  ?  Comment: Ocassional  ? Drug use: Yes  ?  Types: Marijuana  ?  Comment: percocet -last use over 1 week ago. Heroin-last used 2/11  ? Sexual activity: Yes  ?  Birth control/protection: None  ?Other Topics Concern  ? Not on file  ?Social History Narrative  ? Not on file  ? ?Social Determinants of Health  ? ?Financial Resource Strain: Not on file  ?Food Insecurity: Not on file  ?Transportation Needs: Not on file  ?Physical Activity: Not on file  ?Stress: Not on file  ?Social Connections: Not on file  ? ?Additional Social History:  ?  ?  ?  ?  ?  ?  ?  ?  ?  ?  ?  ? ?  Sleep: Fair ? ?Appetite:  Fair ? ?Current Medications: ?Current Facility-Administered Medications  ?Medication Dose Route Frequency Provider Last Rate Last Admin  ? acetaminophen (TYLENOL) tablet 650 mg  650 mg Oral Q6H PRN Sherlon Handing, NP   650 mg at 01/25/22 1202  ? alum & mag hydroxide-simeth (MAALOX/MYLANTA) 200-200-20 MG/5ML suspension 30 mL  30 mL Oral Q4H PRN Sherlon Handing, NP      ? ARIPiprazole (ABILIFY) tablet 15 mg  15 mg Oral Daily Lameisha Schuenemann, [name], MD   15 mg at 01/25/22 0809  ? divalproex (DEPAKOTE) DR tablet 750 mg  750 mg Oral Q12H Waldon Merl F, NP   750 mg at 01/25/22 0809  ? feeding supplement (ENSURE ENLIVE / ENSURE PLUS) liquid 237 mL  237 mL Oral BID BM Vala Raffo T, MD   237 mL at 01/25/22 1344  ? lithium carbonate (LITHOBID) CR tablet 600 mg  600 mg Oral  Q12H Waldon Merl F, NP   600 mg at 01/25/22 2951  ? magnesium hydroxide (MILK OF MAGNESIA) suspension 30 mL  30 mL Oral Daily PRN Sherlon Handing, NP      ? metroNIDAZOLE (FLAGYL) tablet 2,000 mg  2,000 mg Oral Once Mikinzie Maciejewski, [name], MD      ? nicotine (NICODERM CQ - dosed in mg/24 hours) patch 21 mg  21 mg Transdermal Daily Waldon Merl F, NP   21 mg at 01/25/22 0810  ? QUEtiapine (SEROQUEL) tablet 300 mg  300 mg Oral QHS Jalie Eiland, [name], MD   300 mg at 01/24/22 2129  ? ? ?Lab Results:  ?Results for orders placed or performed during the hospital encounter of 01/20/22 (from the past 48 hour(s))  ?Lithium level     Status: None  ? Collection Time: 01/24/22  5:16 AM  ?Result Value Ref Range  ? Lithium Lvl 0.79 0.60 - 1.20 mmol/L  ?  Comment: Performed at [facility], [address], [place], [place] [address]  ?Valproic acid level     Status: None  ? Collection Time: 01/24/22  5:16 AM  ?Result Value Ref Range  ? Valproic Acid Lvl 87 50.0 - 100.0 ug/mL  ?  Comment: Performed at [facility], [address], [place], [place] [address]  ? ? ?Blood Alcohol level:  ?Lab Results  ?Component Value Date  ? ETH <10 01/19/2022  ? ETH <10 12/24/2021  ? ? ?Metabolic Disorder Labs: ?Lab Results  ?Component Value Date  ? HGBA1C 4.9 12/25/2021  ? MPG 93.93 12/25/2021  ? MPG 114 06/02/2021  ? ?No results found for: PROLACTIN ?Lab Results  ?Component Value Date  ? CHOL 160 12/25/2021  ? TRIG 95 12/25/2021  ? HDL 58 12/25/2021  ? CHOLHDL 2.8 12/25/2021  ? VLDL 19 12/25/2021  ? [place] 83 12/25/2021  ? [place] 63 06/01/2021  ? ? ?Physical Findings: ?AIMS:  , ,  ,  ,    ?CIWA:  CIWA-Ar Total: 2 ?COWS:    ? ?Musculoskeletal: ?Strength & Muscle Tone: within normal limits ?Gait & Station: normal ?Patient leans: N/A ? ?Psychiatric Specialty Exam: ? ?Presentation  ?General Appearance: Bizarre; Disheveled ? ?Eye Contact:Good ? ?Speech:Pressured; Slurred ? ?Speech Volume:Decreased ? ?Handedness:Right ? ? ?Mood  and Affect  ?Mood:Euphoric ? ?Affect:Inappropriate; Full Range ? ? ?Thought Process  ?Thought Processes:Disorganized ? ?Descriptions of Associations:Tangential ? ?Orientation:Partial ? ?Thought Content:Obsessions; Paranoid Ideation; Illogical ? ?History of Schizophrenia/Schizoaffective disorder:No ? ?Duration of Psychotic Symptoms:Greater than six months ? ?Hallucinations:No data recorded ?Ideas of  Reference:Delusions ? ?Suicidal Thoughts:No data recorded ?Homicidal Thoughts:No data recorded ? ?Sensorium  ?Memory:Immediate Poor; Recent Poor; Remote Poor ? ?Judgment:Poor ? ?Insight:Poor ? ? ?Executive Functions  ?Concentration:Poor ? ?Attention Span:Poor ? ?Recall:Poor ? ?Fund of Knowledge:Poor ? ?Language:Poor ? ? ?Psychomotor Activity  ?Psychomotor Activity:No data recorded ? ?Assets  ?Assets:Communication Skills; Desire for Improvement; Resilience; Social Support ? ? ?Sleep  ?Sleep:No data recorded ? ? ?Physical Exam: ?Physical Exam ?Vitals and nursing note reviewed.  ?Constitutional:   ?   Appearance: Normal appearance.  ?HENT:  ?   Head: Normocephalic and atraumatic.  ?   Mouth/Throat:  ?   Pharynx: Oropharynx is clear.  ?Eyes:  ?   Pupils: Pupils are equal, round, and reactive to light.  ?Cardiovascular:  ?   Rate and Rhythm: Normal rate and regular rhythm.  ?Pulmonary:  ?   Effort: Pulmonary effort is normal.  ?   Breath sounds: Normal breath sounds.  ?Abdominal:  ?   General: Abdomen is flat.  ?   Palpations: Abdomen is soft.  ?Musculoskeletal:     ?   General: Normal range of motion.  ?Skin: ?   General: Skin is warm and dry.  ?Neurological:  ?   General: No focal deficit present.  ?   Mental Status: She is alert. Mental status is at baseline.  ?Psychiatric:     ?   Attention and Perception: Attention normal.     ?   Mood and Affect: Mood normal. Affect is blunt.     ?   Speech: Speech normal.     ?   Behavior: Behavior normal.     ?   Thought Content: Thought content normal.     ?   Cognition and Memory:  Cognition normal.  ? ?Review of Systems  ?Constitutional: Negative.   ?HENT: Negative.    ?Eyes: Negative.   ?Respiratory: Negative.    ?Cardiovascular: Negative.   ?Gastrointestinal: Negative.   ?Genitourina

## 2022-01-25 NOTE — Plan of Care (Signed)
?  Problem: Education: ?Goal: Knowledge of [facility] Education information/materials will improve ?Outcome: Progressing ?Goal: Mental status will improve ?Outcome: Progressing ?Goal: Verbalization of understanding the information provided will improve ?Outcome: Progressing ?  ?Problem: Health Behavior/Discharge Planning: ?Goal: Compliance with treatment plan for underlying cause of condition will improve ?Outcome: Progressing ?  ?Problem: Self-Concept: ?Goal: Level of anxiety will decrease ?Outcome: Progressing ?  ?

## 2022-01-25 NOTE — Progress Notes (Signed)
Pt visible on the unit, minimal interaction with peers and staff. She denise SI/HI and AVH. Pt stated she was readmitted  because she could not get her medication. Pt is hair is matted and unkempt. She has been mostly in her room. No complaints.  ?

## 2022-01-25 NOTE — Progress Notes (Signed)
Recreation Therapy Notes ? ?Date: 01/25/2022 ? ?Time: 10:50 am ? ?Location: Craft room  ? ?Behavioral response: N/A ? ?Intervention Topic: Self-care  ? ?Discussion/Intervention: ?Patient refused to attend group. ? ?Clinical Observations/Feedback: ?Patient refused to attend group. ? ?Autumn [name] LRT/CTRS ? ? ? ? ? ? ? ?Verlan Grotz ?01/25/2022 11:59 AM ?

## 2022-01-25 NOTE — Progress Notes (Signed)
Patient denies pain at time of assessment. Patient denies anxiety and depression. Patient denies SI/HI/AVH. Patient compliant with medication administration.  ?Patient rated pain 8/10. PRN tylenol was provided to patient. Patient rated pain 3/10 when reassessed.  ?Q15 minute safety checks maintained. Patient remains safe on the unit at this time.  ?

## 2022-01-25 NOTE — Group Note (Signed)
[facility] LCSW Group Therapy Note ? ? ? ?Group Date: 01/25/2022 ?Start Time: 1300 ?End Time: 1400 ? ?Type of Therapy and Topic:  Group Therapy:  Overcoming Obstacles ? ?Participation Level:  [facility] PARTICIPATION LEVEL: Active ? ?Mood: ? ?Description of Group:   ?In this group patients will be encouraged to explore what they see as obstacles to their own wellness and recovery. They will be guided to discuss their thoughts, feelings, and behaviors related to these obstacles. The group will process together ways to cope with barriers, with attention given to specific choices patients can make. Each patient will be challenged to identify changes they are motivated to make in order to overcome their obstacles. This group will be process-oriented, with patients participating in exploration of their own experiences as well as giving and receiving support and challenge from other group members. ? ?Therapeutic Goals: ?1. Patient will identify personal and current obstacles as they relate to admission. ?2. Patient will identify barriers that currently interfere with their wellness or overcoming obstacles.  ?3. Patient will identify feelings, thought process and behaviors related to these barriers. ?4. Patient will identify two changes they are willing to make to overcome these obstacles:  ? ? ?Summary of Patient Progress ?Patient was present in group and supportive of others.  Patient engaged in group discussions.  Patient shared that she feels that her obstacle has been "too trusting of others" and "my Borderline Personality Disorder makes me easily influenced by others".  Patient also discussed how she can use affirmations and boundaries to decrease her anxiety. ? ?Therapeutic Modalities:   ?Cognitive Behavioral Therapy ?Solution Focused Therapy ?Motivational Interviewing ?Relapse Prevention Therapy ? ? ?Rozann Lesches, LCSW ?

## 2022-01-26 DIAGNOSIS — F319 Bipolar disorder, unspecified: Secondary | ICD-10-CM | POA: Diagnosis not present

## 2022-01-26 NOTE — Progress Notes (Signed)
[facility] MD Progress Note ? ?01/26/2022 1:21 PM ?Autumn [name]  ?MRN:  [id number] ?Subjective: Follow-up 46 year old woman with bipolar disorder.  Patient has not been aggressive or agitated.  Attends groups.  Usually interacts appropriately.  Denies that she is having hallucinations.  Yesterday she did complain of symptoms that were typical of Trichomonas and requested antibiotic which has been started. ?Principal Problem: Bipolar 1 disorder ([place]) ?Diagnosis: Principal Problem: ?  Bipolar 1 disorder ([place]) ?Active Problems: ?  Amphetamine use disorder, severe ([place]) ?  Opioid use disorder, severe, dependence ([place]) ?  Cannabis use disorder, severe, dependence ([place]) ?  Opiate abuse, continuous ([place]) ?  Cocaine abuse ([place]) ? ?Total Time spent with patient: 30 minutes ? ?Past Psychiatric History: Past history of bipolar disorder substance abuse recurrent homelessness and behavior problems putting patient at high risk ? ?Past Medical History:  ?Past Medical History:  ?Diagnosis Date  ? Allergy   ? Seasonal, Ultram (seizures)  ? Anemia 2005  ? Bipolar 1 disorder ([place])   ? Glaucoma   ? Seizures ([place]) 2008  ?  ?Past Surgical History:  ?Procedure Laterality Date  ? BREAST EXCISIONAL BIOPSY Right   ? x 2  ? CESAREAN SECTION    ? x 2  ? LEEP  2000  ? ?Family History:  ?Family History  ?Problem Relation Age of Onset  ? Hypertension Mother   ? Hyperlipidemia Mother   ? Asthma Mother   ? Parkinson's disease Father   ? Lung cancer Maternal Grandmother   ? Other Paternal Grandfather 20  ?     Cardiac arrest  ? Heart disease Paternal Grandfather   ? Tourette syndrome Daughter   ? ?Family Psychiatric  History: See previous ?Social History:  ?Social History  ? ?Substance and Sexual Activity  ?Alcohol Use Yes  ? Alcohol/week: 4.0 standard drinks  ? Types: 4 Cans of beer per week  ? Comment: Ocassional  ?   ?Social History  ? ?Substance and Sexual Activity  ?Drug Use Yes  ? Types: Marijuana  ? Comment: percocet -last use over 1 week ago.  Heroin-last used 2/11  ?  ?Social History  ? ?Socioeconomic History  ? Marital status: Legally Separated  ?  Spouse name: Not on file  ? Number of children: Not on file  ? Years of education: Not on file  ? Highest education level: Not on file  ?Occupational History  ? Not on file  ?Tobacco Use  ? Smoking status: Every Day  ?  Packs/day: 1.00  ?  Years: 9.00  ?  Pack years: 9.00  ?  Types: E-cigarettes, Cigarettes  ? Smokeless tobacco: Never  ?Vaping Use  ? Vaping Use: Never used  ?Substance and Sexual Activity  ? Alcohol use: Yes  ?  Alcohol/week: 4.0 standard drinks  ?  Types: 4 Cans of beer per week  ?  Comment: Ocassional  ? Drug use: Yes  ?  Types: Marijuana  ?  Comment: percocet -last use over 1 week ago. Heroin-last used 2/11  ? Sexual activity: Yes  ?  Birth control/protection: None  ?Other Topics Concern  ? Not on file  ?Social History Narrative  ? Not on file  ? ?Social Determinants of Health  ? ?Financial Resource Strain: Not on file  ?Food Insecurity: Not on file  ?Transportation Needs: Not on file  ?Physical Activity: Not on file  ?Stress: Not on file  ?Social Connections: Not on file  ? ?Additional Social History:  ?  ?  ?  ?  ?  ?  ?  ?  ?  ?  ?  ? ?  Sleep: Fair ? ?Appetite:  Fair ? ?Current Medications: ?Current Facility-Administered Medications  ?Medication Dose Route Frequency Provider Last Rate Last Admin  ? acetaminophen (TYLENOL) tablet 650 mg  650 mg Oral Q6H PRN Sherlon Handing, NP   650 mg at 01/26/22 0818  ? alum & mag hydroxide-simeth (MAALOX/MYLANTA) 200-200-20 MG/5ML suspension 30 mL  30 mL Oral Q4H PRN Sherlon Handing, NP      ? ARIPiprazole (ABILIFY) tablet 15 mg  15 mg Oral Daily Milianna Ericsson, [name], MD   15 mg at 01/26/22 0818  ? divalproex (DEPAKOTE) DR tablet 750 mg  750 mg Oral Q12H Waldon Merl F, NP   750 mg at 01/26/22 0818  ? feeding supplement (ENSURE ENLIVE / ENSURE PLUS) liquid 237 mL  237 mL Oral BID BM Ramon Zanders T, MD   237 mL at 01/26/22 1152  ? lithium carbonate  (LITHOBID) CR tablet 600 mg  600 mg Oral Q12H Waldon Merl F, NP   600 mg at 01/26/22 0818  ? magnesium hydroxide (MILK OF MAGNESIA) suspension 30 mL  30 mL Oral Daily PRN Waldon Merl F, NP      ? nicotine (NICODERM CQ - dosed in mg/24 hours) patch 21 mg  21 mg Transdermal Daily Waldon Merl F, NP   21 mg at 01/26/22 0820  ? QUEtiapine (SEROQUEL) tablet 300 mg  300 mg Oral QHS Peighton Edgin, [name], MD   300 mg at 01/25/22 2119  ? ? ?Lab Results: No results found for this or any previous visit (from the past 48 hour(s)). ? ?Blood Alcohol level:  ?Lab Results  ?Component Value Date  ? ETH <10 01/19/2022  ? ETH <10 12/24/2021  ? ? ?Metabolic Disorder Labs: ?Lab Results  ?Component Value Date  ? HGBA1C 4.9 12/25/2021  ? MPG 93.93 12/25/2021  ? MPG 114 06/02/2021  ? ?No results found for: PROLACTIN ?Lab Results  ?Component Value Date  ? CHOL 160 12/25/2021  ? TRIG 95 12/25/2021  ? HDL 58 12/25/2021  ? CHOLHDL 2.8 12/25/2021  ? VLDL 19 12/25/2021  ? [place] 83 12/25/2021  ? [place] 63 06/01/2021  ? ? ?Physical Findings: ?AIMS:  , ,  ,  ,    ?CIWA:  CIWA-Ar Total: 2 ?COWS:    ? ?Musculoskeletal: ?Strength & Muscle Tone: within normal limits ?Gait & Station: normal ?Patient leans: N/A ? ?Psychiatric Specialty Exam: ? ?Presentation  ?General Appearance: Bizarre; Disheveled ? ?Eye Contact:Good ? ?Speech:Pressured; Slurred ? ?Speech Volume:Decreased ? ?Handedness:Right ? ? ?Mood and Affect  ?Mood:Euphoric ? ?Affect:Inappropriate; Full Range ? ? ?Thought Process  ?Thought Processes:Disorganized ? ?Descriptions of Associations:Tangential ? ?Orientation:Partial ? ?Thought Content:Obsessions; Paranoid Ideation; Illogical ? ?History of Schizophrenia/Schizoaffective disorder:No ? ?Duration of Psychotic Symptoms:Greater than six months ? ?Hallucinations:No data recorded ?Ideas of Reference:Delusions ? ?Suicidal Thoughts:No data recorded ?Homicidal Thoughts:No data recorded ? ?Sensorium  ?Memory:Immediate Poor; Recent Poor;  Remote Poor ? ?Judgment:Poor ? ?Insight:Poor ? ? ?Executive Functions  ?Concentration:Poor ? ?Attention Span:Poor ? ?Recall:Poor ? ?Fund of Knowledge:Poor ? ?Language:Poor ? ? ?Psychomotor Activity  ?Psychomotor Activity:No data recorded ? ?Assets  ?Assets:Communication Skills; Desire for Improvement; Resilience; Social Support ? ? ?Sleep  ?Sleep:No data recorded ? ? ?Physical Exam: ?Physical Exam ?Vitals and nursing note reviewed.  ?Constitutional:   ?   Appearance: Normal appearance.  ?HENT:  ?   Head: Normocephalic and atraumatic.  ?   Mouth/Throat:  ?   Pharynx: Oropharynx is clear.  ?Eyes:  ?   Pupils: Pupils are equal, round,  and reactive to light.  ?Cardiovascular:  ?   Rate and Rhythm: Normal rate and regular rhythm.  ?Pulmonary:  ?   Effort: Pulmonary effort is normal.  ?   Breath sounds: Normal breath sounds.  ?Abdominal:  ?   General: Abdomen is flat.  ?   Palpations: Abdomen is soft.  ?Musculoskeletal:     ?   General: Normal range of motion.  ?Skin: ?   General: Skin is warm and dry.  ?Neurological:  ?   General: No focal deficit present.  ?   Mental Status: She is alert. Mental status is at baseline.  ?Psychiatric:     ?   Mood and Affect: Mood normal.     ?   Thought Content: Thought content normal.  ? ?Review of Systems  ?Constitutional: Negative.   ?HENT: Negative.    ?Eyes: Negative.   ?Respiratory: Negative.    ?Cardiovascular: Negative.   ?Gastrointestinal: Negative.   ?Genitourinary:  Positive for dysuria.  ?Musculoskeletal: Negative.   ?Skin: Negative.   ?Neurological: Negative.   ?Psychiatric/Behavioral: Negative.    ?Blood pressure 92/66, pulse 68, temperature 97.8 ?F (36.6 ?C), temperature source Oral, resp. rate 16, height '5\' 1"'$  (1.549 m), weight 46.3 kg, SpO2 99 %. Body mass index is 19.27 kg/m?. ? ? ?Treatment Plan Summary: ?Medication management and Plan no change to psychiatric medicine.  Her mention of the Trichomonas prompted me to look back and when she had last been tested for  hepatitis and HIV or syphilis.  It has been most of a year or longer for those so I have reordered tests just looking for those things because of her constant high risk behavior.  Patient still wants to try to s

## 2022-01-26 NOTE — Progress Notes (Signed)
Approached nurses station asking for Tylenol for pain. Rated pain at 6/10 for shoulder. Received medication and tolerated without incident. Will continue to monitor with q 15 minute safety checks.   ? ? ?C Butler-Nicholson, LPN ?

## 2022-01-26 NOTE — Group Note (Cosign Needed)
LCSW Group Therapy Note ? ? ?Group Date: 01/26/2022 ?Start Time: 1300 ?End Time: 1400 ? ? ?Type of Therapy and Topic:  Group Therapy: Boundaries ? ?Participation Level:  Active ? ?Description of Group: ?This group will address the use of boundaries in their personal lives. Patients will explore why boundaries are important, the difference between healthy and unhealthy boundaries, and negative and postive outcomes of different boundaries and will look at how boundaries can be crossed.  Patients will be encouraged to identify current boundaries in their own lives and identify what kind of boundary is being set. Facilitators will guide patients in utilizing problem-solving interventions to address and correct types boundaries being used and to address when no boundary is being used. Understanding and applying boundaries will be explored and addressed for obtaining and maintaining a balanced life. Patients will be encouraged to explore ways to assertively make their boundaries and needs known to significant others in their lives, using other group members and facilitator for role play, support, and feedback. ? ?Therapeutic Goals: ? ?1.  Patient will identify areas in their life where setting clear boundaries could be  used to improve their life.  ?2.  Patient will identify signs/triggers that a boundary is not being respected. ?3.  Patient will identify two ways to set boundaries in order to achieve balance in  their lives: ?4.  Patient will demonstrate ability to communicate their needs and set boundaries  through discussion and/or role plays ? ?Summary of Patient Progress:  patient was active throughout the session and proved open to feedback from [place] and peers. Patient demonstrated appropriate  insight into the subject matter, was respectful of peers, and was present throughout the entire session. ? ?Therapeutic Modalities:   ?Cognitive Behavioral Therapy ?Solution-Focused Therapy ? ?Karalee Height,  LCSWA ?01/26/2022  2:03 PM   ? ?

## 2022-01-26 NOTE — Progress Notes (Signed)
Patient denies pain. PRN tylenol provided to patient for mouth pain rated 6/10. Patient rated pain 1/10 when pain was reassessed. Patient denies anxiety and depression. Patient denies SI/HI/AVH. Patient compliant with medication administration.  ?Q15 minute safety checks maintained. Patient remains safe on the unit at this time. ?

## 2022-01-26 NOTE — Progress Notes (Signed)
[facility] LCSW Progress Note ?  ?01/26/2022    10:46 AM ? ?Autumn [name]  ? ?[id number]  ? ?Type of Contact and Topic:  Discharge Planning ?  ? ?CSW met with patient to discuss housing at discharge. CSW provide information for various shelters in the surrounding areas. Patient is solely interested in allied [place]; CSW encouraged patient to consider other options as [facility] has limited availability to take new people. Patient states she would rather be homeless in [facility] than at a shelter elsewhere.  ? ?  ?Signed:  ?Durenda Hurt, MSW, LCSWA, LCAS ?01/26/2022 10:46 AM ?  ?  ?

## 2022-01-26 NOTE — Plan of Care (Signed)
?  Problem: Education: ?Goal: Knowledge of [facility] Education information/materials will improve ?Outcome: Progressing ?Goal: Emotional status will improve ?Outcome: Progressing ?Goal: Mental status will improve ?Outcome: Progressing ?Goal: Verbalization of understanding the information provided will improve ?Outcome: Progressing ?  ?Problem: Health Behavior/Discharge Planning: ?Goal: Identification of resources available to assist in meeting health care needs will improve ?Outcome: Progressing ?Goal: Compliance with treatment plan for underlying cause of condition will improve ?Outcome: Progressing ?  ?Problem: Health Behavior/Discharge Planning: ?Goal: Identification of resources available to assist in meeting health care needs will improve ?Outcome: Progressing ?Goal: Compliance with treatment plan for underlying cause of condition will improve ?Outcome: Progressing ?  ?Problem: Education: ?Goal: Utilization of techniques to improve thought processes will improve ?Outcome: Progressing ?Goal: Knowledge of the prescribed therapeutic regimen will improve ?Outcome: Progressing ?  ?Problem: Activity: ?Goal: Interest or engagement in leisure activities will improve ?Outcome: Progressing ?Goal: Imbalance in normal sleep/wake cycle will improve ?Outcome: Progressing ?  ?Problem: Activity: ?Goal: Imbalance in normal sleep/wake cycle will improve ?Outcome: Progressing ?  ?Problem: Coping: ?Goal: Coping ability will improve ?Outcome: Progressing ?Goal: Will verbalize feelings ?Outcome: Progressing ?  ?Problem: Role Relationship: ?Goal: Will demonstrate positive changes in social behaviors and relationships ?Outcome: Progressing ?  ?Problem: Safety: ?Goal: Ability to disclose and discuss suicidal ideas will improve ?Outcome: Progressing ?Goal: Ability to identify and utilize support systems that promote safety will improve ?Outcome: Progressing ?  ?Problem: Self-Concept: ?Goal: Will verbalize positive feelings  about self ?Outcome: Progressing ?Goal: Level of anxiety will decrease ?Outcome: Progressing ?  ?

## 2022-01-26 NOTE — [facility] Counselor (Signed)
CSW reached out to [facility] on 01/25/2022 and 01/26/2022 to check on bed availability.  ? ?CSW has not heard from [facility] at this time.  ? ?Assunta Curtis, MSW, LCSW ?01/26/2022 10:53 AM  ?

## 2022-01-26 NOTE — Plan of Care (Signed)
?  Problem: Education: ?Goal: Knowledge of  General Education information/materials will improve ?Outcome: Progressing ?Goal: Emotional status will improve ?Outcome: Progressing ?Goal: Mental status will improve ?Outcome: Progressing ?Goal: Verbalization of understanding the information provided will improve ?Outcome: Progressing ?  ?Problem: Health Behavior/Discharge Planning: ?Goal: Identification of resources available to assist in meeting health care needs will improve ?Outcome: Progressing ?Goal: Compliance with treatment plan for underlying cause of condition will improve ?Outcome: Progressing ?  ?Problem: Education: ?Goal: Utilization of techniques to improve thought processes will improve ?Outcome: Progressing ?Goal: Knowledge of the prescribed therapeutic regimen will improve ?Outcome: Progressing ?  ?

## 2022-01-26 NOTE — Plan of Care (Signed)
?  Problem: Education: ?Goal: Knowledge of [facility] Education information/materials will improve ?Outcome: Progressing ?Goal: Mental status will improve ?Outcome: Progressing ?Goal: Verbalization of understanding the information provided will improve ?Outcome: Progressing ?  ?Problem: Health Behavior/Discharge Planning: ?Goal: Compliance with treatment plan for underlying cause of condition will improve ?Outcome: Progressing ?  ?Problem: Education: ?Goal: Knowledge of the prescribed therapeutic regimen will improve ?Outcome: Progressing ?  ?Problem: Activity: ?Goal: Interest or engagement in leisure activities will improve ?Outcome: Progressing ?  ?Problem: Coping: ?Goal: Will verbalize feelings ?Outcome: Progressing ?  ?Problem: Self-Concept: ?Goal: Level of anxiety will decrease ?Outcome: Progressing ?  ?

## 2022-01-26 NOTE — Progress Notes (Signed)
Patient received 8pm meds and allowed this writer to cut her hair this evening.  Discussed starting a new chapter in her life once discharging. Plans to stay med compliant and attend AA meetings.  Encouraged patient with supportive listening. Will continue to monitor with q 15 minute safety checks.  ? ? ?C Butler-Nicholson, LPN ?

## 2022-01-26 NOTE — Progress Notes (Signed)
Recreation Therapy Notes ? ? ?Date: 01/26/2022 ? ?Time: 10:05 am ? ?Location: Craft room  ? ?Behavioral response: N/A ? ?Intervention Topic: Problem Solving   ? ?Discussion/Intervention: ?Patient refused to attend group. ? ?Clinical Observations/Feedback: ?Patient refused to attend group. ? ?Peytan Andringa LRT/CTRS ? ? ? ? ? ? ? ?Kemontae Dunklee ?01/26/2022 11:47 AM ?

## 2022-01-27 DIAGNOSIS — F319 Bipolar disorder, unspecified: Secondary | ICD-10-CM | POA: Diagnosis not present

## 2022-01-27 LAB — RPR: RPR Ser Ql: NONREACTIVE

## 2022-01-27 LAB — HIV ANTIBODY (ROUTINE TESTING W REFLEX): HIV Screen 4th Generation wRfx: NONREACTIVE

## 2022-01-27 LAB — HEPATITIS C ANTIBODY: HCV Ab: NONREACTIVE

## 2022-01-27 MED ORDER — QUETIAPINE FUMARATE 200 MG PO TABS
400.0000 mg | ORAL_TABLET | Freq: Every day | ORAL | Status: DC
Start: 2022-01-27 — End: 2022-02-05
  Administered 2022-01-27 – 2022-02-04 (×9): 400 mg via ORAL
  Filled 2022-01-27 (×9): qty 2

## 2022-01-27 NOTE — Progress Notes (Signed)
Patient much brighter this evening. Reports attending groups today and feeling better overall.  She is active on the unit and gets along well with her peers.  She is med compliant and received her meds without incident.  Will continue to encourage her to come to staff with any concerns. Q 15 minute safety checks in place.  ? ? ?C Butler-Nicholson, LPN ?

## 2022-01-27 NOTE — Progress Notes (Signed)
[facility] ([place]) MD Progress Note  01/27/2022 4:19 PM Autumn [name]  MRN:  [id number] Subjective: Follow-up patient with bipolar disorder.  Patient calm and lucid today.  Complained of not sleeping very well at night but is not reporting any suicidal thinking and denies hallucinations.  Able to hold conversation without getting bizarre.  She was asking me today if she [facility] with which I was [facility].  She claims to have a contact there who may be able to provide some assistance for her.  Encouraged her to make phone calls to try and find discharge planning Principal Problem: Bipolar 1 disorder (HCC) Diagnosis: Principal Problem:   Bipolar 1 disorder (HCC) Active Problems:   Amphetamine use disorder, severe (HCC)   Opioid use disorder, severe, dependence (HCC)   Cannabis use disorder, severe, dependence (HCC)   Opiate abuse, continuous (HCC)   Cocaine abuse (HCC)  Total Time spent with patient: 30 minutes  Past Psychiatric History: Recurrent episodes of psychosis and admission also related to substance abuse  Past Medical History:  Past Medical History:  Diagnosis Date   Allergy    Seasonal, Ultram (seizures)   Anemia 2005   Bipolar 1 disorder (HCC)    Glaucoma    Seizures (HCC) 2008    Past Surgical History:  Procedure Laterality Date   BREAST EXCISIONAL BIOPSY Right    x 2   CESAREAN SECTION     x 2   LEEP  2000   Family History:  Family History  Problem Relation Age of Onset   Hypertension Mother    Hyperlipidemia Mother    Asthma Mother    Parkinson's disease Father    Lung cancer Maternal Grandmother    Other Paternal Grandfather 63       Cardiac arrest   Heart disease Paternal Grandfather    Tourette syndrome Daughter    Family Psychiatric  History: See previous Social History:  Social History   Substance and Sexual Activity  Alcohol Use Yes   Alcohol/week: 4.0 standard drinks   Types: 4 Cans of beer per week   Comment:  Ocassional     Social History   Substance and Sexual Activity  Drug Use Yes   Types: Marijuana   Comment: percocet -last use over 1 week ago. Heroin-last used 2/11    Social History   Socioeconomic History   Marital status: Legally Separated    Spouse name: Not on file   Number of children: Not on file   Years of education: Not on file   Highest education level: Not on file  Occupational History   Not on file  Tobacco Use   Smoking status: Every Day    Packs/day: 1.00    Years: 9.00    Pack years: 9.00    Types: E-cigarettes, Cigarettes   Smokeless tobacco: Never  Vaping Use   Vaping Use: Never used  Substance and Sexual Activity   Alcohol use: Yes    Alcohol/week: 4.0 standard drinks    Types: 4 Cans of beer per week    Comment: Ocassional   Drug use: Yes    Types: Marijuana    Comment: percocet -last use over 1 week ago. Heroin-last used 2/11   Sexual activity: Yes    Birth control/protection: None  Other Topics Concern   Not on file  Social History Narrative   Not on file   Social Determinants of Health   Financial Resource Strain: Not on file  Food Insecurity:  Not on file  Transportation Needs: Not on file  Physical Activity: Not on file  Stress: Not on file  Social Connections: Not on file   Additional Social History:                         Sleep: Fair  Appetite:  Fair  Current Medications: Current Facility-Administered Medications  Medication Dose Route Frequency Provider Last Rate Last Admin   acetaminophen (TYLENOL) tablet 650 mg  650 mg Oral Q6H PRN Vanetta Mulders, NP   650 mg at 01/27/22 1207   alum & mag hydroxide-simeth (MAALOX/MYLANTA) 200-200-20 MG/5ML suspension 30 mL  30 mL Oral Q4H PRN Vanetta Mulders, NP       ARIPiprazole (ABILIFY) tablet 15 mg  15 mg Oral Daily Chaquetta Schlottman T, MD   15 mg at 01/27/22 0900   divalproex (DEPAKOTE) DR tablet 750 mg  750 mg Oral Q12H Gabriel Cirri F, NP   750 mg at 01/27/22 0900    feeding supplement (ENSURE ENLIVE / ENSURE PLUS) liquid 237 mL  237 mL Oral BID BM Malayia Spizzirri T, MD   237 mL at 01/27/22 1500   lithium carbonate (LITHOBID) CR tablet 600 mg  600 mg Oral Q12H Gabriel Cirri F, NP   600 mg at 01/27/22 0900   magnesium hydroxide (MILK OF MAGNESIA) suspension 30 mL  30 mL Oral Daily PRN Gabriel Cirri F, NP       nicotine (NICODERM CQ - dosed in mg/24 hours) patch 21 mg  21 mg Transdermal Daily Gabriel Cirri F, NP   21 mg at 01/27/22 0900   QUEtiapine (SEROQUEL) tablet 400 mg  400 mg Oral QHS Coury Grieger, Jackquline Denmark, MD        Lab Results:  Results for orders placed or performed during the hospital encounter of 01/20/22 (from the past 48 hour(s))  RPR     Status: None   Collection Time: 01/26/22  7:50 PM  Result Value Ref Range   RPR Ser Ql NON REACTIVE NON REACTIVE    Comment: Performed at [facility], [address], [place], [place] [address]  Hepatitis C antibody     Status: None   Collection Time: 01/26/22  7:50 PM  Result Value Ref Range   HCV Ab NON REACTIVE NON REACTIVE    Comment: (NOTE) Nonreactive HCV antibody screen is consistent with no HCV infections,  unless recent infection is suspected or other evidence exists to indicate HCV infection.  Performed at [facility], [address], [place], [place] [address]   HIV Antibody (routine testing w rflx)     Status: None   Collection Time: 01/26/22  7:50 PM  Result Value Ref Range   HIV Screen 4th Generation wRfx Non Reactive Non Reactive    Comment: Performed at [facility], [address] [place], [place] [address]    Blood Alcohol level:  Lab Results  Component Value Date   [facility] <10 01/19/2022   ETH <10 12/24/2021    Metabolic Disorder Labs: Lab Results  Component Value Date   HGBA1C 4.9 12/25/2021   MPG 93.93 12/25/2021   MPG 114 06/02/2021   No results found for: PROLACTIN Lab Results  Component Value Date   CHOL 160 12/25/2021   TRIG 95 12/25/2021    HDL 58 12/25/2021   CHOLHDL 2.8 12/25/2021   VLDL 19 12/25/2021   LDLCALC 83 12/25/2021   LDLCALC 63 06/01/2021  Physical Findings: AIMS:  , ,  ,  ,    CIWA:  CIWA-Ar Total: 2 COWS:     Musculoskeletal: Strength & Muscle Tone: within normal limits Gait & Station: normal Patient leans: N/A  Psychiatric Specialty Exam:  Presentation  General Appearance: Bizarre; Disheveled  Eye Contact:Good  Speech:Pressured; Slurred  Speech Volume:Decreased  Handedness:Right   Mood and Affect  Mood:Euphoric  Affect:Inappropriate; Full Range   Thought Process  Thought Processes:Disorganized  Descriptions of Associations:Tangential  Orientation:Partial  Thought Content:Obsessions; Paranoid Ideation; Illogical  History of Schizophrenia/Schizoaffective disorder:No  Duration of Psychotic Symptoms:Greater than six months  Hallucinations:No data recorded Ideas of Reference:Delusions  Suicidal Thoughts:No data recorded Homicidal Thoughts:No data recorded  Sensorium  Memory:Immediate Poor; Recent Poor; Remote Poor  Judgment:Poor  Insight:Poor   Executive Functions  Concentration:Poor  Attention Span:Poor  Recall:Poor  Fund of Knowledge:Poor  Language:Poor   Psychomotor Activity  Psychomotor Activity:No data recorded  Assets  Assets:Communication Skills; Desire for Improvement; Resilience; Social Support   Sleep  Sleep:No data recorded   Physical Exam: Physical Exam Vitals and nursing note reviewed.  Constitutional:      Appearance: Normal appearance.  HENT:     Head: Normocephalic and atraumatic.     Mouth/Throat:     Pharynx: Oropharynx is clear.  Eyes:     Pupils: Pupils are equal, round, and reactive to light.  Cardiovascular:     Rate and Rhythm: Normal rate and regular rhythm.  Pulmonary:     Effort: Pulmonary effort is normal.     Breath sounds: Normal breath sounds.  Abdominal:     General: Abdomen is flat.     Palpations: Abdomen  is soft.  Musculoskeletal:        General: Normal range of motion.  Skin:    General: Skin is warm and dry.  Neurological:     General: No focal deficit present.     Mental Status: She is alert. Mental status is at baseline.  Psychiatric:        Mood and Affect: Mood normal.        Thought Content: Thought content normal.   Review of Systems  Constitutional: Negative.   HENT: Negative.    Eyes: Negative.   Respiratory: Negative.    Cardiovascular: Negative.   Gastrointestinal: Negative.   Musculoskeletal: Negative.   Skin: Negative.   Neurological: Negative.   Psychiatric/Behavioral:  Negative for hallucinations and suicidal ideas. The patient has insomnia.   Blood pressure (!) 83/59, pulse 67, temperature 97.7 F (36.5 C), temperature source Oral, resp. rate 18, height 5\' 1"  (1.549 m), weight 46.3 kg, SpO2 100 %. Body mass index is 19.27 kg/m.   Treatment Plan Summary: Medication management and Plan increase Seroquel to 400 mg at night because of her complaint of insomnia.  Encourage patient to be more active and trying to find some kind of discharge plan as she seems to be stabilizing toward her baseline again.  Mordecai Rasmussen, MD 01/27/2022, 4:19 PM

## 2022-01-27 NOTE — Plan of Care (Signed)
?  Problem: Education: ?Goal: Knowledge of [facility] Education information/materials will improve ?Outcome: Progressing ?Goal: Emotional status will improve ?Outcome: Progressing ?Goal: Mental status will improve ?Outcome: Progressing ?Goal: Verbalization of understanding the information provided will improve ?Outcome: Progressing ?  ?Problem: Health Behavior/Discharge Planning: ?Goal: Identification of resources available to assist in meeting health care needs will improve ?Outcome: Progressing ?Goal: Compliance with treatment plan for underlying cause of condition will improve ?Outcome: Progressing ?  ?Problem: Education: ?Goal: Utilization of techniques to improve thought processes will improve ?Outcome: Progressing ?Goal: Knowledge of the prescribed therapeutic regimen will improve ?Outcome: Progressing ?  ?Problem: Activity: ?Goal: Interest or engagement in leisure activities will improve ?Outcome: Progressing ?Goal: Imbalance in normal sleep/wake cycle will improve ?Outcome: Progressing ?  ?Problem: Coping: ?Goal: Coping ability will improve ?Outcome: Progressing ?Goal: Will verbalize feelings ?Outcome: Progressing ?  ?Problem: Role Relationship: ?Goal: Will demonstrate positive changes in social behaviors and relationships ?Outcome: Progressing ?  ?Problem: Safety: ?Goal: Ability to disclose and discuss suicidal ideas will improve ?Outcome: Progressing ?Goal: Ability to identify and utilize support systems that promote safety will improve ?Outcome: Progressing ?  ?Problem: Self-Concept: ?Goal: Will verbalize positive feelings about self ?Outcome: Progressing ?Goal: Level of anxiety will decrease ?Outcome: Progressing ?  ?

## 2022-01-27 NOTE — Progress Notes (Signed)
D: Pt alert and oriented. Pt rates depression 0/10, hopelessness 0/10, and anxiety 0/10.  Pt reports energy level as good and concentration as being good. Pt reports sleep last night as being good. Pt denies experiencing any pain at this time. Pt denies experiencing any SI/HI, or AVH at this time.  ? ?A: Scheduled medications administered to pt, per MD orders. Support and encouragement provided. Frequent verbal contact made. Routine safety checks conducted q15 minutes.  ? ?R: No adverse drug reactions noted. Pt verbally contracts for safety at this time. Pt complaint with medications and treatment plan. Pt interacts well with others on the unit. Pt remains safe at this time. Will continue to monitor.    ?

## 2022-01-27 NOTE — Group Note (Signed)
[place] LCSW Group Therapy Note ? ? ?Group Date: 01/27/2022 ?Start Time: 1315 ?End Time: 4782 ? ? ?Type of Therapy/Topic:  Group Therapy:  Emotion Regulation ? ?Participation Level:  Active  ? ?Mood: ? ?Description of Group:   ? The purpose of this group is to assist patients in learning to regulate negative emotions and experience positive emotions. Patients will be guided to discuss ways in which they have been vulnerable to their negative emotions. These vulnerabilities will be juxtaposed with experiences of positive emotions or situations, and patients challenged to use positive emotions to combat negative ones. Special emphasis will be placed on coping with negative emotions in conflict situations, and patients will process healthy conflict resolution skills. ? ?Therapeutic Goals: ?Patient will identify two positive emotions or experiences to reflect on in order to balance out negative emotions:  ?Patient will label two or more emotions that they find the most difficult to experience:  ?Patient will be able to demonstrate positive conflict resolution skills through discussion or role plays:  ? ?Summary of Patient Progress: ?Patient was present in group.  Patient was an active participant in group.  Patient was able to share that for her emotion regulation means "control your emotions".  She identified that she has felt the following emotions recently "grief, mania, happy and sad, and overwhelmed".  She was able to identify that her "actions" were what led to her feeling this way.  She was able to discuss what she could do differently to have changed the outcome of negative situations.   ? ?Therapeutic Modalities:   ?Cognitive Behavioral Therapy ?Feelings Identification ?Dialectical Behavioral Therapy ? ? ?Rozann Lesches, LCSW ?

## 2022-01-27 NOTE — Progress Notes (Signed)
Recreation Therapy Notes ? ? ? ?Date: 01/27/2022 ? ?Time: 11:05 am  ? ?Location: Craft room  ? ?Behavioral response: N/A ? ?Intervention Topic: Stress Management  ? ?Discussion/Intervention: ?Patient refused to attend group. ? ?Clinical Observations/Feedback: ?Patient refused to attend group. ? ?Donnika Kucher LRT/CTRS ? ? ? ? ? ? ? ?Autumn [name] ?01/27/2022 12:25 PM ?

## 2022-01-27 NOTE — BH IP Treatment Plan (Signed)
Interdisciplinary Treatment and Diagnostic Plan Update ? ?01/27/2022 ?Time of Session: 8:30AM ?Autumn [name] ?MRN: [id number] ? ?Principal Diagnosis: Bipolar 1 disorder ([place]) ? ?Secondary Diagnoses: Principal Problem: ?  Bipolar 1 disorder ([place]) ?Active Problems: ?  Amphetamine use disorder, severe ([place]) ?  Opioid use disorder, severe, dependence ([place]) ?  Cannabis use disorder, severe, dependence ([place]) ?  Opiate abuse, continuous ([place]) ?  Cocaine abuse ([place]) ? ? ?Current Medications:  ?Current Facility-Administered Medications  ?Medication Dose Route Frequency Provider Last Rate Last Admin  ? acetaminophen (TYLENOL) tablet 650 mg  650 mg Oral Q6H PRN Sherlon Handing, NP   650 mg at 01/26/22 1433  ? alum & mag hydroxide-simeth (MAALOX/MYLANTA) 200-200-20 MG/5ML suspension 30 mL  30 mL Oral Q4H PRN Sherlon Handing, NP      ? ARIPiprazole (ABILIFY) tablet 15 mg  15 mg Oral Daily Clapacs, [name], MD   15 mg at 01/27/22 0900  ? divalproex (DEPAKOTE) DR tablet 750 mg  750 mg Oral Q12H Waldon Merl F, NP   750 mg at 01/27/22 0900  ? feeding supplement (ENSURE ENLIVE / ENSURE PLUS) liquid 237 mL  237 mL Oral BID BM Clapacs, [name], MD   237 mL at 01/26/22 1402  ? lithium carbonate (LITHOBID) CR tablet 600 mg  600 mg Oral Q12H Waldon Merl F, NP   600 mg at 01/27/22 0900  ? magnesium hydroxide (MILK OF MAGNESIA) suspension 30 mL  30 mL Oral Daily PRN Waldon Merl F, NP      ? nicotine (NICODERM CQ - dosed in mg/24 hours) patch 21 mg  21 mg Transdermal Daily Waldon Merl F, NP   21 mg at 01/27/22 0900  ? QUEtiapine (SEROQUEL) tablet 300 mg  300 mg Oral QHS Clapacs, [name], MD   300 mg at 01/26/22 2113  ? ?PTA Medications: ?Medications Prior to Admission  ?Medication Sig Dispense Refill Last Dose  ? ARIPiprazole (ABILIFY) 30 MG tablet Take 1 tablet (30 mg total) by mouth daily. 30 tablet 1   ? divalproex (DEPAKOTE) 250 MG DR tablet Take 3 tablets (750 mg total) by mouth every 12 (twelve) hours. 180 tablet  1   ? hydrOXYzine (ATARAX) 50 MG tablet Take 1 tablet (50 mg total) by mouth every 6 (six) hours as needed for anxiety. 60 tablet 1   ? lithium carbonate (LITHOBID) 300 MG CR tablet Take 2 tablets (600 mg total) by mouth every 12 (twelve) hours. 120 tablet 1   ? nicotine (NICODERM CQ - DOSED IN MG/24 HOURS) 21 mg/24hr patch Place 1 patch (21 mg total) onto the skin daily at 6 (six) AM. 28 patch 1   ? QUEtiapine 150 MG TABS Take 150 mg by mouth at bedtime. 30 tablet 1   ? ? ?Patient Stressors: Financial difficulties   ?Medication change or noncompliance   ?Substance abuse   ?Traumatic event   ? ?Patient Strengths: Average or above average intelligence  ?Communication skills  ?Physical Health  ? ?Treatment Modalities: Medication Management, Group therapy, Case management,  ?1 to 1 session with clinician, Psychoeducation, Recreational therapy. ? ? ?Physician Treatment Plan for Primary Diagnosis: Bipolar 1 disorder ([place]) ?Long Term Goal(s): Improvement in symptoms so as ready for discharge  ? ?Short Term Goals: Compliance with prescribed medications will improve ?Ability to identify triggers associated with substance abuse/mental health issues will improve ?Ability to verbalize feelings will improve ?Ability to disclose and discuss suicidal ideas ?Ability to demonstrate self-control will improve ? ?Medication Management:  Evaluate patient's response, side effects, and tolerance of medication regimen. ? ?Therapeutic Interventions: 1 to 1 sessions, Unit Group sessions and Medication administration. ? ?Evaluation of Outcomes: Progressing ? ?Physician Treatment Plan for Secondary Diagnosis: Principal Problem: ?  Bipolar 1 disorder ([place]) ?Active Problems: ?  Amphetamine use disorder, severe ([place]) ?  Opioid use disorder, severe, dependence ([place]) ?  Cannabis use disorder, severe, dependence ([place]) ?  Opiate abuse, continuous ([place]) ?  Cocaine abuse ([place]) ? ?Long Term Goal(s): Improvement in symptoms so as ready for discharge   ? ?Short Term Goals: Compliance with prescribed medications will improve ?Ability to identify triggers associated with substance abuse/mental health issues will improve ?Ability to verbalize feelings will improve ?Ability to disclose and discuss suicidal ideas ?Ability to demonstrate self-control will improve    ? ?Medication Management: Evaluate patient's response, side effects, and tolerance of medication regimen. ? ?Therapeutic Interventions: 1 to 1 sessions, Unit Group sessions and Medication administration. ? ?Evaluation of Outcomes: Progressing ? ? ?RN Treatment Plan for Primary Diagnosis: Bipolar 1 disorder ([place]) ?Long Term Goal(s): Knowledge of disease and therapeutic regimen to maintain health will improve ? ?Short Term Goals: Ability to demonstrate self-control, Ability to participate in decision making will improve, Ability to verbalize feelings will improve, Ability to disclose and discuss suicidal ideas, Ability to identify and develop effective coping behaviors will improve, and Compliance with prescribed medications will improve ? ?Medication Management: RN will administer medications as ordered by provider, will assess and evaluate patient's response and provide education to patient for prescribed medication. RN will report any adverse and/or side effects to prescribing provider. ? ?Therapeutic Interventions: 1 on 1 counseling sessions, Psychoeducation, Medication administration, Evaluate responses to treatment, Monitor vital signs and CBGs as ordered, Perform/monitor CIWA, COWS, AIMS and Fall Risk screenings as ordered, Perform wound care treatments as ordered. ? ?Evaluation of Outcomes: Progressing ? ? ?LCSW Treatment Plan for Primary Diagnosis: Bipolar 1 disorder ([place]) ?Long Term Goal(s): Safe transition to appropriate next level of care at discharge, Engage patient in therapeutic group addressing interpersonal concerns. ? ?Short Term Goals: Engage patient in aftercare planning with referrals and  resources, Increase social support, Increase ability to appropriately verbalize feelings, Increase emotional regulation, Facilitate acceptance of mental health diagnosis and concerns, Facilitate patient progression through stages of change regarding substance use diagnoses and concerns, Identify triggers associated with mental health/substance abuse issues, and Increase skills for wellness and recovery ? ?Therapeutic Interventions: Assess for all discharge needs, 1 to 1 time with Education officer, museum, Explore available resources and support systems, Assess for adequacy in community support network, Educate family and significant other(s) on suicide prevention, Complete Psychosocial Assessment, Interpersonal group therapy. ? ?Evaluation of Outcomes: Progressing ? ? ?Progress in Treatment: ?Attending groups: Yes. ?Participating in groups: Yes. ?Taking medication as prescribed: Yes. ?Toleration medication: Yes. ?Family/Significant other contact made: Yes, individual(s) contacted:  SPE attempts were made.  ?Patient understands diagnosis: Yes. ?Discussing patient identified problems/goals with staff: Yes. ?Medical problems stabilized or resolved: Yes. ?Denies suicidal/homicidal ideation: Yes. ?Issues/concerns per patient self-inventory: No. ?Other: none ? ?New problem(s) identified: No, Describe:  none ?  ?New Short Term/Long Term Goal(s): elimination of symptoms of psychosis, medication management for mood stabilization; elimination of SI thoughts; development of comprehensive mental wellness/sobriety plan.  Update 01/27/2022: No changes at this time.  ?  ?Patient Goals:  "attend group and get as much out of it as I can"  Update 01/27/2022: No changes at this time.  ?  ?Discharge Plan or Barriers: Patient reports  that she is unable to return to her home and needs assistance in locating somewhere else to live at discharge.  She is requesting assistance in getting in at the homeless shelter in [facility], [employer].   CSW to assist.  Update 01/27/2022: Patient reports that she would like to be discharged once placement is found.  Patient reports that she would like to be discharged to the shelter in [facility] she de

## 2022-01-28 DIAGNOSIS — F319 Bipolar disorder, unspecified: Secondary | ICD-10-CM | POA: Diagnosis not present

## 2022-01-28 LAB — HEPATITIS B SURFACE ANTIBODY, QUANTITATIVE: Hep B S AB Quant (Post): 32.2 m[IU]/mL (ref >9.9)

## 2022-01-28 NOTE — Progress Notes (Signed)
Patient interacting well with Peers and Staff. Scheduled medications administered per Provider order. Support and encouragement provided. Routine safety checks conducted every 15 minutes. Patient notified to inform Staff with problems or concerns. ? ?No adverse drug reactions noted. Patient contracts for safety at this time.   ?

## 2022-01-28 NOTE — Progress Notes (Signed)
D: Pt alert and oriented. Pt rates depression 0/10, hopelessness 0/10, and anxiety 0/10.  Pt reports energy level as good and concentration as being good. Pt reports sleep last night as being good. Pt denies experiencing any pain at this time. Pt denies experiencing any SI/HI, or AVH at this time.  ?  ?A: Scheduled medications administered to pt, per MD orders. Support and encouragement provided. Frequent verbal contact made. Routine safety checks conducted q15 minutes.  ?  ?R: No adverse drug reactions noted. Pt verbally contracts for safety at this time. Pt complaint with medications and treatment plan. Pt interacts well with others on the unit. Pt remains safe at this time. Will continue to monitor.    ?

## 2022-01-28 NOTE — Plan of Care (Signed)
?  Problem: Education: ?Goal: Knowledge of [facility] Education information/materials will improve ?01/28/2022 2057 by Abigail Miyamoto, RN ?Outcome: Progressing ?01/28/2022 1430 by Abigail Miyamoto, RN ?Outcome: Progressing ?Goal: Emotional status will improve ?01/28/2022 2057 by Abigail Miyamoto, RN ?Outcome: Progressing ?01/28/2022 1430 by Abigail Miyamoto, RN ?Outcome: Progressing ?Goal: Mental status will improve ?01/28/2022 2057 by Abigail Miyamoto, RN ?Outcome: Progressing ?01/28/2022 1430 by Abigail Miyamoto, RN ?Outcome: Progressing ?Goal: Verbalization of understanding the information provided will improve ?01/28/2022 2057 by Abigail Miyamoto, RN ?Outcome: Progressing ?01/28/2022 1430 by Abigail Miyamoto, RN ?Outcome: Progressing ?  ?Problem: Health Behavior/Discharge Planning: ?Goal: Identification of resources available to assist in meeting health care needs will improve ?01/28/2022 2057 by Abigail Miyamoto, RN ?Outcome: Progressing ?01/28/2022 1430 by Abigail Miyamoto, RN ?Outcome: Progressing ?Goal: Compliance with treatment plan for underlying cause of condition will improve ?01/28/2022 2057 by Abigail Miyamoto, RN ?Outcome: Progressing ?01/28/2022 1430 by Abigail Miyamoto, RN ?Outcome: Progressing ?  ?Problem: Education: ?Goal: Utilization of techniques to improve thought processes will improve ?01/28/2022 2057 by Abigail Miyamoto, RN ?Outcome: Progressing ?01/28/2022 1430 by Abigail Miyamoto, RN ?Outcome: Progressing ?Goal: Knowledge of the prescribed therapeutic regimen will improve ?01/28/2022 2057 by Abigail Miyamoto, RN ?Outcome: Progressing ?01/28/2022 1430 by Abigail Miyamoto, RN ?Outcome: Progressing ?  ?Problem: Activity: ?Goal: Interest or engagement in leisure activities will improve ?01/28/2022 2057 by Abigail Miyamoto, RN ?Outcome: Progressing ?01/28/2022 1430 by Abigail Miyamoto, RN ?Outcome: Progressing ?Goal: Imbalance in normal sleep/wake cycle will improve ?01/28/2022 2057 by  Abigail Miyamoto, RN ?Outcome: Progressing ?01/28/2022 1430 by Abigail Miyamoto, RN ?Outcome: Progressing ?  ?Problem: Coping: ?Goal: Coping ability will improve ?01/28/2022 2057 by Abigail Miyamoto, RN ?Outcome: Progressing ?01/28/2022 1430 by Abigail Miyamoto, RN ?Outcome: Progressing ?Goal: Will verbalize feelings ?01/28/2022 2057 by Abigail Miyamoto, RN ?Outcome: Progressing ?01/28/2022 1430 by Abigail Miyamoto, RN ?Outcome: Progressing ?  ?Problem: Role Relationship: ?Goal: Will demonstrate positive changes in social behaviors and relationships ?01/28/2022 2057 by Abigail Miyamoto, RN ?Outcome: Progressing ?01/28/2022 1430 by Abigail Miyamoto, RN ?Outcome: Progressing ?  ?Problem: Safety: ?Goal: Ability to disclose and discuss suicidal ideas will improve ?01/28/2022 2057 by Abigail Miyamoto, RN ?Outcome: Progressing ?01/28/2022 1430 by Abigail Miyamoto, RN ?Outcome: Progressing ?Goal: Ability to identify and utilize support systems that promote safety will improve ?01/28/2022 2057 by Abigail Miyamoto, RN ?Outcome: Progressing ?01/28/2022 1430 by Abigail Miyamoto, RN ?Outcome: Progressing ?  ?Problem: Self-Concept: ?Goal: Will verbalize positive feelings about self ?01/28/2022 2057 by Abigail Miyamoto, RN ?Outcome: Progressing ?01/28/2022 1430 by Abigail Miyamoto, RN ?Outcome: Progressing ?Goal: Level of anxiety will decrease ?01/28/2022 2057 by Abigail Miyamoto, RN ?Outcome: Progressing ?01/28/2022 1430 by Abigail Miyamoto, RN ?Outcome: Progressing ?  ?

## 2022-01-28 NOTE — Progress Notes (Signed)
Recreation Therapy Notes ? ?Date: 01/28/2022 ? ?Time: 10:35 am   ? ?Location: Courtyard      ? ?Behavioral response: N/A ?  ?Intervention Topic: Leisure   ? ?Discussion/Intervention: ?Patient refused to attend group.  ? ?Clinical Observations/Feedback:  ?Patient refused to attend group.  ?  ?Aurelius Gildersleeve LRT/CTRS ? ? ? ? ? ? ? ? ? ?Tanaia Hawkey ?01/28/2022 12:46 PM ?

## 2022-01-28 NOTE — B[facility] Counselor (Signed)
CSW heard from [facility] that beds are not available until mid week next week.  CSW has inquired on if pt can return there as she has stayed there in the past.  ? ?CSW waiting to hear back. ? ?Autumn [name], MSW, LCSW ?01/28/2022 12:34 PM  ?

## 2022-01-28 NOTE — Plan of Care (Signed)
?  Problem: Education: ?Goal: Knowledge of [facility] Education information/materials will improve ?Outcome: Progressing ?Goal: Emotional status will improve ?Outcome: Progressing ?Goal: Mental status will improve ?Outcome: Progressing ?Goal: Verbalization of understanding the information provided will improve ?Outcome: Progressing ?  ?Problem: Health Behavior/Discharge Planning: ?Goal: Identification of resources available to assist in meeting health care needs will improve ?Outcome: Progressing ?Goal: Compliance with treatment plan for underlying cause of condition will improve ?Outcome: Progressing ?  ?Problem: Education: ?Goal: Utilization of techniques to improve thought processes will improve ?Outcome: Progressing ?Goal: Knowledge of the prescribed therapeutic regimen will improve ?Outcome: Progressing ?  ?Problem: Activity: ?Goal: Interest or engagement in leisure activities will improve ?Outcome: Progressing ?Goal: Imbalance in normal sleep/wake cycle will improve ?Outcome: Progressing ?  ?Problem: Coping: ?Goal: Coping ability will improve ?Outcome: Progressing ?Goal: Will verbalize feelings ?Outcome: Progressing ?  ?Problem: Role Relationship: ?Goal: Will demonstrate positive changes in social behaviors and relationships ?Outcome: Progressing ?  ?Problem: Safety: ?Goal: Ability to disclose and discuss suicidal ideas will improve ?Outcome: Progressing ?Goal: Ability to identify and utilize support systems that promote safety will improve ?Outcome: Progressing ?  ?Problem: Self-Concept: ?Goal: Will verbalize positive feelings about self ?Outcome: Progressing ?Goal: Level of anxiety will decrease ?Outcome: Progressing ?  ?

## 2022-01-28 NOTE — Group Note (Signed)
[facility] LCSW Group Therapy Note ? ? ?Group Date: 01/28/2022 ?Start Time: 1300 ?End Time: 1400 ? ? ?Type of Therapy/Topic:  Group Therapy:  Balance in Life ? ?Participation Level:  Active  ? ?Description of Group:   ? This group will address the concept of balance and how it feels and looks when one is unbalanced. Patients will be encouraged to process areas in their lives that are out of balance, and identify reasons for remaining unbalanced. Facilitators will guide patients utilizing problem- solving interventions to address and correct the stressor making their life unbalanced. Understanding and applying boundaries will be explored and addressed for obtaining  and maintaining a balanced life. Patients will be encouraged to explore ways to assertively make their unbalanced needs known to significant others in their lives, using other group members and facilitator for support and feedback. ? ?Therapeutic Goals: ?Patient will identify two or more emotions or situations they have that consume much of in their lives. ?Patient will identify signs/triggers that life has become out of balance:  ?Patient will identify two ways to set boundaries in order to achieve balance in their lives:  ?Patient will demonstrate ability to communicate their needs through discussion and/or role plays ? ?Summary of Patient Progress: ?Patient was present for the entirety of the group session. Patient was an active listener and participated in the topic of discussion, provided helpful advice to others, and added nuance to topic of conversation. Patient shared that she would like to determine her priorities pertaining to balance in life by brainstorming and journaling.  ? ? ? ?Therapeutic Modalities:   ?Cognitive Behavioral Therapy ?Solution-Focused Therapy ?Assertiveness Training ? ? ?Durenda Hurt, LCSWA ?

## 2022-01-28 NOTE — Progress Notes (Signed)
[facility] MD Progress Note ? ?01/28/2022 3:41 PM ?Autumn [name]  ?MRN:  [id number] ?Subjective: Follow-up patient with bipolar disorder and substance abuse.  No new complaint.  Says she is not having any depressed mood.  Does not appear to be acting out on any hallucinations or showing signs of psychosis.  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  Patient still has no place to stay.  Shelter will not have any beds available till next week and we still would not know whether she would be eligible for 1. ?Principal Problem: Bipolar 1 disorder ([place]) ?Diagnosis: Principal Problem: ?  Bipolar 1 disorder ([place]) ?Active Problems: ?  Amphetamine use disorder, severe ([place]) ?  Opioid use disorder, severe, dependence ([place]) ?  Cannabis use disorder, severe, dependence ([place]) ?  Opiate abuse, continuous ([place]) ?  Cocaine abuse ([place]) ? ?Total Time spent with patient: 30 minutes ? ?Past Psychiatric History: Past history of recurrent bipolar disorder with psychosis and substance abuse ? ?Past Medical History:  ?Past Medical History:  ?Diagnosis Date  ? Allergy   ? Seasonal, Ultram (seizures)  ? Anemia 2005  ? Bipolar 1 disorder ([place])   ? Glaucoma   ? Seizures ([place]) 2008  ?  ?Past Surgical History:  ?Procedure Laterality Date  ? BREAST EXCISIONAL BIOPSY Right   ? x 2  ? CESAREAN SECTION    ? x 2  ? LEEP  2000  ? ?Family History:  ?Family History  ?Problem Relation Age of Onset  ? Hypertension Mother   ? Hyperlipidemia Mother   ? Asthma Mother   ? Parkinson's disease Father   ? Lung cancer Maternal Grandmother   ? Other Paternal Grandfather 20  ?     Cardiac arrest  ? Heart disease Paternal Grandfather   ? Tourette syndrome Daughter   ? ?Family Psychiatric  History: See previous ?Social History:  ?Social History  ? ?Substance and Sexual Activity  ?Alcohol Use Yes  ? Alcohol/week: 4.0 standard drinks  ? Types: 4 Cans of beer per week  ? Comment: Ocassional  ?   ?Social History  ? ?Substance  and Sexual Activity  ?Drug Use Yes  ? Types: Marijuana  ? Comment: percocet -last use over 1 week ago. Heroin-last used 2/11  ?  ?Social History  ? ?Socioeconomic History  ? Marital status: Legally Separated  ?  Spouse name: Not on file  ? Number of children: Not on file  ? Years of education: Not on file  ? Highest education level: Not on file  ?Occupational History  ? Not on file  ?Tobacco Use  ? Smoking status: Every Day  ?  Packs/day: 1.00  ?  Years: 9.00  ?  Pack years: 9.00  ?  Types: E-cigarettes, Cigarettes  ? Smokeless tobacco: Never  ?Vaping Use  ? Vaping Use: Never used  ?Substance and Sexual Activity  ? Alcohol use: Yes  ?  Alcohol/week: 4.0 standard drinks  ?  Types: 4 Cans of beer per week  ?  Comment: Ocassional  ? Drug use: Yes  ?  Types: Marijuana  ?  Comment: percocet -last use over 1 week ago. Heroin-last used 2/11  ? Sexual activity: Yes  ?  Birth control/protection: None  ?Other Topics Concern  ? Not on file  ?Social History Narrative  ? Not on file  ? ?Social Determinants of Health  ? ?Financial Resource Strain: Not on file  ?Food Insecurity: Not  on file  ?Transportation Needs: Not on file  ?Physical Activity: Not on file  ?Stress: Not on file  ?Social Connections: Not on file  ? ?Additional Social History:  ?  ?  ?  ?  ?  ?  ?  ?  ?  ?  ?  ? ?Sleep: Fair ? ?Appetite:  Fair ? ?Current Medications: ?Current Facility-Administered Medications  ?Medication Dose Route Frequency Provider Last Rate Last Admin  ? acetaminophen (TYLENOL) tablet 650 mg  650 mg Oral Q6H PRN Sherlon Handing, NP   650 mg at 01/28/22 1538  ? alum & mag hydroxide-simeth (MAALOX/MYLANTA) 200-200-20 MG/5ML suspension 30 mL  30 mL Oral Q4H PRN Sherlon Handing, NP      ? ARIPiprazole (ABILIFY) tablet 15 mg  15 mg Oral Daily Taleisha Kaczynski, [name], MD   15 mg at 01/28/22 4403  ? divalproex (DEPAKOTE) DR tablet 750 mg  750 mg Oral Q12H Waldon Merl F, NP   750 mg at 01/28/22 4742  ? feeding supplement (ENSURE ENLIVE / ENSURE  PLUS) liquid 237 mL  237 mL Oral BID BM Zigmond Trela T, MD   237 mL at 01/28/22 1344  ? lithium carbonate (LITHOBID) CR tablet 600 mg  600 mg Oral Q12H Waldon Merl F, NP   600 mg at 01/28/22 0827  ? magnesium hydroxide (MILK OF MAGNESIA) suspension 30 mL  30 mL Oral Daily PRN Waldon Merl F, NP      ? nicotine (NICODERM CQ - dosed in mg/24 hours) patch 21 mg  21 mg Transdermal Daily Waldon Merl F, NP   21 mg at 01/28/22 5956  ? QUEtiapine (SEROQUEL) tablet 400 mg  400 mg Oral QHS Kolden Dupee, [name], MD   400 mg at 01/27/22 2130  ? ? ?Lab Results:  ?Results for orders placed or performed during the hospital encounter of 01/20/22 (from the past 48 hour(s))  ?RPR     Status: None  ? Collection Time: 01/26/22  7:50 PM  ?Result Value Ref Range  ? RPR Ser Ql NON REACTIVE NON REACTIVE  ?  Comment: Performed at [facility], [place] [address] [place], [place] [address]  ?Hepatitis C antibody     Status: None  ? Collection Time: 01/26/22  7:50 PM  ?Result Value Ref Range  ? HCV Ab NON REACTIVE NON REACTIVE  ?  Comment: (NOTE) ?Nonreactive HCV antibody screen is consistent with no HCV infections,  ?unless recent infection is suspected or other evidence exists to ?indicate HCV infection. ? ?Performed at [facility], [place] [address], [place], [place] ?[address] ?  ?Hepatitis B surface antibody     Status: None  ? Collection Time: 01/26/22  7:50 PM  ?Result Value Ref Range  ? Hepatitis B-Post 32.2 Immunity>9.9 mIU/mL  ?  Comment: (NOTE) ? Status of Immunity                     Anti-HBs Level ? ------------------                     -------------- ?Inconsistent with Immunity                   0.0 - 9.9 ?Consistent with Immunity                          >9.9 ?Performed At: [facility] ?[address] [place], [place] [id number] ?Rush Farmer  MD [id number] ?  ?HIV Antibody (routine testing w rflx)     Status: None  ? Collection Time: 01/26/22  7:50 PM  ?Result Value Ref Range  ? HIV Screen 4th  Generation wRfx Non Reactive Non Reactive  ?  Comment: Performed at [facility], [place] [address], [place], [place] [address]  ? ? ?Blood Alcohol level:  ?Lab Results  ?Component Value Date  ? ETH <10 01/19/2022  ? ETH <10 12/24/2021  ? ? ?Metabolic Disorder Labs: ?Lab Results  ?Component Value Date  ? HGBA1C 4.9 12/25/2021  ? MPG 93.93 12/25/2021  ? MPG 114 06/02/2021  ? ?No results found for: PROLACTIN ?Lab Results  ?Component Value Date  ? CHOL 160 12/25/2021  ? TRIG 95 12/25/2021  ? HDL 58 12/25/2021  ? CHOLHDL 2.8 12/25/2021  ? VLDL 19 12/25/2021  ? [place] 83 12/25/2021  ? [place] 63 06/01/2021  ? ? ?Physical Findings: ?AIMS:  , ,  ,  ,    ?CIWA:  CIWA-Ar Total: 2 ?COWS:    ? ?Musculoskeletal: ?Strength & Muscle Tone: within normal limits ?Gait & Station: normal ?Patient leans: N/A ? ?Psychiatric Specialty Exam: ? ?Presentation  ?General Appearance: Bizarre; Disheveled ? ?Eye Contact:Good ? ?Speech:Pressured; Slurred ? ?Speech Volume:Decreased ? ?Handedness:Right ? ? ?Mood and Affect  ?Mood:Euphoric ? ?Affect:Inappropriate; Full Range ? ? ?Thought Process  ?Thought Processes:Disorganized ? ?Descriptions of Associations:Tangential ? ?Orientation:Partial ? ?Thought Content:Obsessions; Paranoid Ideation; Illogical ? ?History of Schizophrenia/Schizoaffective disorder:No ? ?Duration of Psychotic Symptoms:Greater than six months ? ?Hallucinations:No data recorded ?Ideas of Reference:Delusions ? ?Suicidal Thoughts:No data recorded ?Homicidal Thoughts:No data recorded ? ?Sensorium  ?Memory:Immediate Poor; Recent Poor; Remote Poor ? ?Judgment:Poor ? ?Insight:Poor ? ? ?Executive Functions  ?Concentration:Poor ? ?Attention Span:Poor ? ?Recall:Poor ? ?Fund of Knowledge:Poor ? ?Language:Poor ? ? ?Psychomotor Activity  ?Psychomotor Activity:No data recorded ? ?Assets  ?Assets:Communication Skills; Desire for Improvement; Resilience; Social Support ? ? ?Sleep  ?Sleep:No data recorded ? ? ?Physical Exam: ?Physical  Exam ?Constitutional:   ?   Appearance: Normal appearance.  ?HENT:  ?   Head: Normocephalic and atraumatic.  ?   Mouth/Throat:  ?   Pharynx: Oropharynx is clear.  ?Eyes:  ?   Pupils: Pupils are equal, round, an

## 2022-01-29 DIAGNOSIS — F319 Bipolar disorder, unspecified: Secondary | ICD-10-CM | POA: Diagnosis not present

## 2022-01-29 NOTE — Progress Notes (Signed)
[facility] MD Progress Note ? ?01/29/2022 4:44 PM ?Autumn [name]  ?MRN:  [id number] ?Subjective: Follow-up for this 46 year old woman with bipolar disorder and substance abuse.  Patient has no new complaints today.  Mood has been stable.  No new physical complaints.  Denies any suicidal ideation denies any hallucinations.  Still unsure where she will stay as we have no beds available at any shelters.  Patient was informed today that her syphilis hepatitis C and HIV tests are all negative. ?Principal Problem: Bipolar 1 disorder ([place]) ?Diagnosis: Principal Problem: ?  Bipolar 1 disorder ([place]) ?Active Problems: ?  Amphetamine use disorder, severe ([place]) ?  Opioid use disorder, severe, dependence ([place]) ?  Cannabis use disorder, severe, dependence ([place]) ?  Opiate abuse, continuous ([place]) ?  Cocaine abuse ([place]) ? ?Total Time spent with patient: 30 minutes ? ?Past Psychiatric History: Past history of recurrent psychotic episodes and substance abuse ? ?Past Medical History:  ?Past Medical History:  ?Diagnosis Date  ? Allergy   ? Seasonal, Ultram (seizures)  ? Anemia 2005  ? Bipolar 1 disorder ([place])   ? Glaucoma   ? Seizures ([place]) 2008  ?  ?Past Surgical History:  ?Procedure Laterality Date  ? BREAST EXCISIONAL BIOPSY Right   ? x 2  ? CESAREAN SECTION    ? x 2  ? LEEP  2000  ? ?Family History:  ?Family History  ?Problem Relation Age of Onset  ? Hypertension Mother   ? Hyperlipidemia Mother   ? Asthma Mother   ? Parkinson's disease Father   ? Lung cancer Maternal Grandmother   ? Other Paternal Grandfather 39  ?     Cardiac arrest  ? Heart disease Paternal Grandfather   ? Tourette syndrome Daughter   ? ?Family Psychiatric  History: See previous ?Social History:  ?Social History  ? ?Substance and Sexual Activity  ?Alcohol Use Yes  ? Alcohol/week: 4.0 standard drinks  ? Types: 4 Cans of beer per week  ? Comment: Ocassional  ?   ?Social History  ? ?Substance and Sexual Activity  ?Drug Use Yes  ? Types: Marijuana  ? Comment: percocet -last  use over 1 week ago. Heroin-last used 2/11  ?  ?Social History  ? ?Socioeconomic History  ? Marital status: Legally Separated  ?  Spouse name: Not on file  ? Number of children: Not on file  ? Years of education: Not on file  ? Highest education level: Not on file  ?Occupational History  ? Not on file  ?Tobacco Use  ? Smoking status: Every Day  ?  Packs/day: 1.00  ?  Years: 9.00  ?  Pack years: 9.00  ?  Types: E-cigarettes, Cigarettes  ? Smokeless tobacco: Never  ?Vaping Use  ? Vaping Use: Never used  ?Substance and Sexual Activity  ? Alcohol use: Yes  ?  Alcohol/week: 4.0 standard drinks  ?  Types: 4 Cans of beer per week  ?  Comment: Ocassional  ? Drug use: Yes  ?  Types: Marijuana  ?  Comment: percocet -last use over 1 week ago. Heroin-last used 2/11  ? Sexual activity: Yes  ?  Birth control/protection: None  ?Other Topics Concern  ? Not on file  ?Social History Narrative  ? Not on file  ? ?Social Determinants of Health  ? ?Financial Resource Strain: Not on file  ?Food Insecurity: Not on file  ?Transportation Needs: Not on file  ?Physical Activity: Not on file  ?Stress: Not on file  ?Social Connections:  Not on file  ? ?Additional Social History:  ?  ?  ?  ?  ?  ?  ?  ?  ?  ?  ?  ? ?Sleep: Fair ? ?Appetite:  Fair ? ?Current Medications: ?Current Facility-Administered Medications  ?Medication Dose Route Frequency Provider Last Rate Last Admin  ? acetaminophen (TYLENOL) tablet 650 mg  650 mg Oral Q6H PRN Sherlon Handing, NP   650 mg at 01/29/22 1214  ? alum & mag hydroxide-simeth (MAALOX/MYLANTA) 200-200-20 MG/5ML suspension 30 mL  30 mL Oral Q4H PRN Sherlon Handing, NP      ? ARIPiprazole (ABILIFY) tablet 15 mg  15 mg Oral Daily Alazne Quant, [name], MD   15 mg at 01/29/22 0758  ? divalproex (DEPAKOTE) DR tablet 750 mg  750 mg Oral Q12H Waldon Merl F, NP   750 mg at 01/29/22 0757  ? feeding supplement (ENSURE ENLIVE / ENSURE PLUS) liquid 237 mL  237 mL Oral BID BM [place] Madole T, MD   237 mL at 01/29/22 1022   ? lithium carbonate (LITHOBID) CR tablet 600 mg  600 mg Oral Q12H Waldon Merl F, NP   600 mg at 01/29/22 9518  ? magnesium hydroxide (MILK OF MAGNESIA) suspension 30 mL  30 mL Oral Daily PRN Waldon Merl F, NP      ? nicotine (NICODERM CQ - dosed in mg/24 hours) patch 21 mg  21 mg Transdermal Daily Waldon Merl F, NP   21 mg at 01/29/22 0759  ? QUEtiapine (SEROQUEL) tablet 400 mg  400 mg Oral QHS Towana Stenglein, [name], MD   400 mg at 01/28/22 2130  ? ? ?Lab Results: No results found for this or any previous visit (from the past 48 hour(s)). ? ?Blood Alcohol level:  ?Lab Results  ?Component Value Date  ? ETH <10 01/19/2022  ? ETH <10 12/24/2021  ? ? ?Metabolic Disorder Labs: ?Lab Results  ?Component Value Date  ? HGBA1C 4.9 12/25/2021  ? MPG 93.93 12/25/2021  ? MPG 114 06/02/2021  ? ?No results found for: PROLACTIN ?Lab Results  ?Component Value Date  ? CHOL 160 12/25/2021  ? TRIG 95 12/25/2021  ? HDL 58 12/25/2021  ? CHOLHDL 2.8 12/25/2021  ? VLDL 19 12/25/2021  ? [place] 83 12/25/2021  ? [place] 63 06/01/2021  ? ? ?Physical Findings: ?AIMS:  , ,  ,  ,    ?CIWA:  CIWA-Ar Total: 2 ?COWS:    ? ?Musculoskeletal: ?Strength & Muscle Tone: within normal limits ?Gait & Station: normal ?Patient leans: N/A ? ?Psychiatric Specialty Exam: ? ?Presentation  ?General Appearance: Bizarre; Disheveled ? ?Eye Contact:Good ? ?Speech:Pressured; Slurred ? ?Speech Volume:Decreased ? ?Handedness:Right ? ? ?Mood and Affect  ?Mood:Euphoric ? ?Affect:Inappropriate; Full Range ? ? ?Thought Process  ?Thought Processes:Disorganized ? ?Descriptions of Associations:Tangential ? ?Orientation:Partial ? ?Thought Content:Obsessions; Paranoid Ideation; Illogical ? ?History of Schizophrenia/Schizoaffective disorder:No ? ?Duration of Psychotic Symptoms:Greater than six months ? ?Hallucinations:No data recorded ?Ideas of Reference:Delusions ? ?Suicidal Thoughts:No data recorded ?Homicidal Thoughts:No data recorded ? ?Sensorium  ?Memory:Immediate  Poor; Recent Poor; Remote Poor ? ?Judgment:Poor ? ?Insight:Poor ? ? ?Executive Functions  ?Concentration:Poor ? ?Attention Span:Poor ? ?Recall:Poor ? ?Fund of Knowledge:Poor ? ?Language:Poor ? ? ?Psychomotor Activity  ?Psychomotor Activity:No data recorded ? ?Assets  ?Assets:Communication Skills; Desire for Improvement; Resilience; Social Support ? ? ?Sleep  ?Sleep:No data recorded ? ? ?Physical Exam: ?Physical Exam ?Vitals and nursing note reviewed.  ?Constitutional:   ?   Appearance: Normal appearance.  ?HENT:  ?  Head: Normocephalic and atraumatic.  ?   Mouth/Throat:  ?   Pharynx: Oropharynx is clear.  ?Eyes:  ?   Pupils: Pupils are equal, round, and reactive to light.  ?Cardiovascular:  ?   Rate and Rhythm: Normal rate and regular rhythm.  ?Pulmonary:  ?   Effort: Pulmonary effort is normal.  ?   Breath sounds: Normal breath sounds.  ?Abdominal:  ?   General: Abdomen is flat.  ?   Palpations: Abdomen is soft.  ?Musculoskeletal:     ?   General: Normal range of motion.  ?Skin: ?   General: Skin is warm and dry.  ?Neurological:  ?   General: No focal deficit present.  ?   Mental Status: She is alert. Mental status is at baseline.  ?Psychiatric:     ?   Mood and Affect: Mood normal.     ?   Thought Content: Thought content normal.  ? ?Review of Systems  ?Constitutional: Negative.   ?HENT: Negative.    ?Eyes: Negative.   ?Respiratory: Negative.    ?Cardiovascular: Negative.   ?Gastrointestinal: Negative.   ?Musculoskeletal: Negative.   ?Skin: Negative.   ?Neurological: Negative.   ?Psychiatric/Behavioral: Negative.    ?Blood pressure 92/63, pulse 66, temperature 97.9 ?F (36.6 ?C), temperature source Oral, resp. rate 17, height '5\' 1"'$  (1.549 m), weight 46.3 kg, SpO2 98 %. Body mass index is 19.27 kg/m?. ? ? ?Treatment Plan Summary: ?Plan no change to medication.  Plan is for trying to get her out of the hospital to some kind of safe disposition such as a homeless shelter the first 1 that will be available is likely  this upcoming week. ? ?Alethia Berthold, MD ?01/29/2022, 4:44 PM ? ?

## 2022-01-29 NOTE — Progress Notes (Signed)
Recreation Therapy Notes ? ? ?Date: 01/29/2022 ? ?Time: 10:30 am   ? ?Location: Courtyard      ? ?Behavioral response: N/A ?  ?Intervention Topic: Social Skills   ? ?Discussion/Intervention: ?Patient refused to attend group.  ? ?Clinical Observations/Feedback:  ?Patient refused to attend group.  ?  ?Hlee Fringer LRT/CTRS ? ? ? ? ? ? ? ?Dyron Kawano ?01/29/2022 12:25 PM ?

## 2022-01-29 NOTE — Plan of Care (Signed)
?  Problem: Education: ?Goal: Knowledge of [facility] Education information/materials will improve ?Outcome: Progressing ?Goal: Emotional status will improve ?Outcome: Progressing ?Goal: Mental status will improve ?Outcome: Progressing ?Goal: Verbalization of understanding the information provided will improve ?Outcome: Progressing ?  ?Problem: Health Behavior/Discharge Planning: ?Goal: Identification of resources available to assist in meeting health care needs will improve ?Outcome: Progressing ?Goal: Compliance with treatment plan for underlying cause of condition will improve ?Outcome: Progressing ?  ?Problem: Health Behavior/Discharge Planning: ?Goal: Compliance with treatment plan for underlying cause of condition will improve ?Outcome: Progressing ?  ?Problem: Education: ?Goal: Utilization of techniques to improve thought processes will improve ?Outcome: Progressing ?Goal: Knowledge of the prescribed therapeutic regimen will improve ?Outcome: Progressing ?  ?Problem: Self-Concept: ?Goal: Will verbalize positive feelings about self ?Outcome: Progressing ?Goal: Level of anxiety will decrease ?Outcome: Progressing ?  ?Problem: Safety: ?Goal: Ability to disclose and discuss suicidal ideas will improve ?Outcome: Progressing ?Goal: Ability to identify and utilize support systems that promote safety will improve ?Outcome: Progressing ?  ?Problem: Role Relationship: ?Goal: Will demonstrate positive changes in social behaviors and relationships ?Outcome: Progressing ?  ?

## 2022-01-29 NOTE — Plan of Care (Signed)
?  Problem: Education: ?Goal: Knowledge of [facility] Education information/materials will improve ?Outcome: Progressing ?Goal: Emotional status will improve ?Outcome: Progressing ?Goal: Mental status will improve ?Outcome: Progressing ?Goal: Verbalization of understanding the information provided will improve ?Outcome: Progressing ?  ?Problem: Health Behavior/Discharge Planning: ?Goal: Identification of resources available to assist in meeting health care needs will improve ?Outcome: Progressing ?Goal: Compliance with treatment plan for underlying cause of condition will improve ?Outcome: Progressing ?  ?Problem: Education: ?Goal: Utilization of techniques to improve thought processes will improve ?Outcome: Progressing ?Goal: Knowledge of the prescribed therapeutic regimen will improve ?Outcome: Progressing ?  ?Problem: Activity: ?Goal: Interest or engagement in leisure activities will improve ?Outcome: Progressing ?Goal: Imbalance in normal sleep/wake cycle will improve ?Outcome: Progressing ?  ?Problem: Coping: ?Goal: Coping ability will improve ?Outcome: Progressing ?Goal: Will verbalize feelings ?Outcome: Progressing ?  ?Problem: Role Relationship: ?Goal: Will demonstrate positive changes in social behaviors and relationships ?Outcome: Progressing ?  ?Problem: Safety: ?Goal: Ability to disclose and discuss suicidal ideas will improve ?Outcome: Progressing ?Goal: Ability to identify and utilize support systems that promote safety will improve ?Outcome: Progressing ?  ?Problem: Self-Concept: ?Goal: Will verbalize positive feelings about self ?Outcome: Progressing ?Goal: Level of anxiety will decrease ?Outcome: Progressing ?  ?

## 2022-01-29 NOTE — Progress Notes (Signed)
Patient  is pleasant and cooperative.  She is active on the unit and interacts well with others.  She is med complaint and received medication without incident. Encouraged patient to seek staff with any concerns. Will continue to monitor with q 15 minute safety notes. ? ? ?C Butler-Nicholson, LPN ?

## 2022-01-29 NOTE — Progress Notes (Signed)
D: Pt denies SI/HI/AVH, anxiety, pain and depression. Discharge plan in progress and awaiting bed. Present on the unit off and on for meals and meds.  ? ?A: Pt provided support and encouragement. Pt given medication per protocol and standing orders. Q52msafety checks implemented and continued.  ? ?R: Pt safe on the unit. Will continue to monitor.  ?

## 2022-01-29 NOTE — Plan of Care (Signed)
°  Problem: Group Participation °Goal: STG - Patient will engage in groups without prompting or encouragement from LRT x3 group sessions within 5 recreation therapy group sessions °Description: STG - Patient will engage in groups without prompting or encouragement from LRT x3 group sessions within 5 recreation therapy group sessions °Outcome: Progressing °  °

## 2022-01-30 DIAGNOSIS — F319 Bipolar disorder, unspecified: Secondary | ICD-10-CM | POA: Diagnosis not present

## 2022-01-30 NOTE — Progress Notes (Signed)
[facility] MD Progress Note ? ?01/30/2022 9:07 AM ?Autumn [name]  ?MRN:  [id number] ? ?Principal Problem: Bipolar 1 disorder ([place]) ?Diagnosis: Principal Problem: ?  Bipolar 1 disorder ([place]) ?Active Problems: ?  Amphetamine use disorder, severe ([place]) ?  Opioid use disorder, severe, dependence ([place]) ?  Cannabis use disorder, severe, dependence ([place]) ?  Opiate abuse, continuous ([place]) ?  Cocaine abuse ([place]) ? ?Patient is a  46y.o. female who presents to the [facility] unit due to exacerbation of bipolar disorder. ? ?Interval History ?Patient was seen today for re-evaluation.  Nursing reports no events overnight. The patient has no issues with performing ADLs.  Patient has been medication compliant.   ? ?Subjective:  On assessment patient reports "I feel pretty good today" and denies any complaints, mental or physical. She reports "good" mood. She denies current suicidal/homicidal ideations. She denies auditory/visual hallucinations. ?The patient reports no side effects from medications.   ? ?Labs: no new results for review. ? ?  ? ? ?Total Time spent with patient: 20 minutes ? ?Past Psychiatric History: see H&P ? ? ?Past Medical History:  ?Past Medical History:  ?Diagnosis Date  ? Allergy   ? Seasonal, Ultram (seizures)  ? Anemia 2005  ? Bipolar 1 disorder ([place])   ? Glaucoma   ? Seizures ([place]) 2008  ?  ?Past Surgical History:  ?Procedure Laterality Date  ? BREAST EXCISIONAL BIOPSY Right   ? x 2  ? CESAREAN SECTION    ? x 2  ? LEEP  2000  ? ?Family History:  ?Family History  ?Problem Relation Age of Onset  ? Hypertension Mother   ? Hyperlipidemia Mother   ? Asthma Mother   ? Parkinson's disease Father   ? Lung cancer Maternal Grandmother   ? Other Paternal Grandfather 32  ?     Cardiac arrest  ? Heart disease Paternal Grandfather   ? Tourette syndrome Daughter   ? ?Family Psychiatric  History: see H&P ? ?Social History:  ?Social History  ? ?Substance and Sexual Activity  ?Alcohol Use Yes  ? Alcohol/week: 4.0 standard drinks  ? Types: 4  Cans of beer per week  ? Comment: Ocassional  ?   ?Social History  ? ?Substance and Sexual Activity  ?Drug Use Yes  ? Types: Marijuana  ? Comment: percocet -last use over 1 week ago. Heroin-last used 2/11  ?  ?Social History  ? ?Socioeconomic History  ? Marital status: Legally Separated  ?  Spouse name: Not on file  ? Number of children: Not on file  ? Years of education: Not on file  ? Highest education level: Not on file  ?Occupational History  ? Not on file  ?Tobacco Use  ? Smoking status: Every Day  ?  Packs/day: 1.00  ?  Years: 9.00  ?  Pack years: 9.00  ?  Types: E-cigarettes, Cigarettes  ? Smokeless tobacco: Never  ?Vaping Use  ? Vaping Use: Never used  ?Substance and Sexual Activity  ? Alcohol use: Yes  ?  Alcohol/week: 4.0 standard drinks  ?  Types: 4 Cans of beer per week  ?  Comment: Ocassional  ? Drug use: Yes  ?  Types: Marijuana  ?  Comment: percocet -last use over 1 week ago. Heroin-last used 2/11  ? Sexual activity: Yes  ?  Birth control/protection: None  ?Other Topics Concern  ? Not on file  ?Social History Narrative  ? Not on file  ? ?Social Determinants of Health  ? ?Emergency planning/management officer  Strain: Not on file  ?Food Insecurity: Not on file  ?Transportation Needs: Not on file  ?Physical Activity: Not on file  ?Stress: Not on file  ?Social Connections: Not on file  ? ?Additional Social History:  ?  ?  ?  ?  ?  ?  ?  ?  ?  ?  ?  ? ?Sleep: Fair ? ?Appetite:  Fair ? ?Current Medications: ?Current Facility-Administered Medications  ?Medication Dose Route Frequency Provider Last Rate Last Admin  ? acetaminophen (TYLENOL) tablet 650 mg  650 mg Oral Q6H PRN Sherlon Handing, NP   650 mg at 01/29/22 1214  ? alum & mag hydroxide-simeth (MAALOX/MYLANTA) 200-200-20 MG/5ML suspension 30 mL  30 mL Oral Q4H PRN Sherlon Handing, NP      ? ARIPiprazole (ABILIFY) tablet 15 mg  15 mg Oral Daily Clapacs, [name], MD   15 mg at 01/30/22 0801  ? divalproex (DEPAKOTE) DR tablet 750 mg  750 mg Oral Q12H Waldon Merl  F, NP   750 mg at 01/30/22 0801  ? feeding supplement (ENSURE ENLIVE / ENSURE PLUS) liquid 237 mL  237 mL Oral BID BM Clapacs, [name], MD   237 mL at 01/29/22 1500  ? lithium carbonate (LITHOBID) CR tablet 600 mg  600 mg Oral Q12H Waldon Merl F, NP   600 mg at 01/30/22 6270  ? magnesium hydroxide (MILK OF MAGNESIA) suspension 30 mL  30 mL Oral Daily PRN Waldon Merl F, NP      ? nicotine (NICODERM CQ - dosed in mg/24 hours) patch 21 mg  21 mg Transdermal Daily Waldon Merl F, NP   21 mg at 01/30/22 0801  ? QUEtiapine (SEROQUEL) tablet 400 mg  400 mg Oral QHS Clapacs, [name], MD   400 mg at 01/29/22 2126  ? ? ?Lab Results: No results found for this or any previous visit (from the past 48 hour(s)). ? ?Blood Alcohol level:  ?Lab Results  ?Component Value Date  ? ETH <10 01/19/2022  ? ETH <10 12/24/2021  ? ? ?Metabolic Disorder Labs: ?Lab Results  ?Component Value Date  ? HGBA1C 4.9 12/25/2021  ? MPG 93.93 12/25/2021  ? MPG 114 06/02/2021  ? ?No results found for: PROLACTIN ?Lab Results  ?Component Value Date  ? CHOL 160 12/25/2021  ? TRIG 95 12/25/2021  ? HDL 58 12/25/2021  ? CHOLHDL 2.8 12/25/2021  ? VLDL 19 12/25/2021  ? [place] 83 12/25/2021  ? [place] 63 06/01/2021  ? ? ?Physical Findings: ?AIMS:  , ,  ,  ,    ?CIWA:  CIWA-Ar Total: 2 ?COWS:    ? ?Musculoskeletal: ?Strength & Muscle Tone: within normal limits ?Gait & Station: normal ?Patient leans: N/A ? ?Psychiatric Specialty Exam: ? ?Presentation  ?General Appearance: disheveled ?Eye Contact:Good ? ?Speech: wnl ?Speech Volume:Decreased ? ?Handedness:Right ? ? ?Mood and Affect  ?Mood: anxiuous ?Affect:restricted ? ?Thought Process  ?Thought Processes: organized somewhat ?Descriptions of Associations: wnl ?Orientation: full, AAO x3 ? ?Thought Content: preoccupations ?History of Schizophrenia/Schizoaffective disorder:No ? ?Duration of Psychotic Symptoms:Greater than six months ? ?Hallucinations: denies ?Ideas of Reference: does not express ?] ?Suicidal  Thoughts: denies ?Homicidal Thoughts: denies ? ?Sensorium  ?Memory: fair ? ?Judgment: fair ?Insight: fair ? ?Executive Functions  ?Concentration: fair ?Attention Span:fair ?Recall: fair ?Fund of Knowledge: fair ?Language: good ? ?Psychomotor Activity  ?Psychomotor Activity: no PMA, no PMR ? ?Assets  ?Assets:Communication Skills; Desire for Improvement; Resilience; Social Support ? ? ?Sleep  ?Sleep:No data recorded ? ? ?  Physical Exam: ?Physical Exam ?ROS ?Blood pressure (!) 87/64, pulse 62, temperature 97.9 ?F (36.6 ?C), temperature source Oral, resp. rate 18, height '5\' 1"'$  (1.549 m), weight 46.3 kg, SpO2 97 %. Body mass index is 19.27 kg/m?. ? ? ?Treatment Plan Summary: ?Daily contact with patient to assess and evaluate symptoms and progress in treatment and Medication management ? ?Patient is a 46 year old female with the above-stated past psychiatric history who is seen in follow-up.  Chart reviewed. Patient discussed with nursing. Patient appears calm. Compliant with medicines. No changes in medicines made today. Patient is waiting for appropriate placement. ? ?  ?Plan: ? ?-continue inpatient psych admission; 15-minute checks; daily contact with patient to assess and evaluate symptoms and progress in treatment; psychoeducation. ? ?-continue scheduled medications: ? ARIPiprazole  15 mg Oral Daily  ? divalproex  750 mg Oral Q12H  ? feeding supplement  237 mL Oral BID BM  ? lithium carbonate  600 mg Oral Q12H  ? nicotine  21 mg Transdermal Daily  ? QUEtiapine  400 mg Oral QHS  ? ? ?-continue PRN medications. ? acetaminophen, alum & mag hydroxide-simeth, magnesium hydroxide ? ?-Pertinent Labs: no new labs ordered today ?  ?-Disposition: Patient is showing gradual improvement, and hopefully will be ready for discharge next week. ? ?-  I certify that the patient does need, on a daily basis, active treatment furnished directly by or requiring the supervision of inpatient psychiatric facility personnel.   ? ? ?Larita Fife, MD ?01/30/2022, 9:07 AM ? ?

## 2022-01-30 NOTE — Plan of Care (Signed)
Patient appropriate with staff & peers. Pleasant and cooperative on approach.Participated in unit activities. Denies SI,HI and AVH. Appetite and energy level good. ADLs maintained. Support and encouragement given. ?

## 2022-01-30 NOTE — Progress Notes (Signed)
No distress noted patient denies SI/HI/AVH she appears less anxious interacting appropriately with peers and staff. 15 minutes safety checks maintained willlcontinue to monitor,  ?

## 2022-01-30 NOTE — Group Note (Signed)
[place] LCSW Group Therapy Note ? ? ?Group Date: 01/30/2022 ?Start Time: 1300 ?End Time: 1400 ? ?Type of Therapy and Topic:  Group Therapy:  Feelings around Relapse and Recovery ? ?Participation Level:  Active  ? ?Mood: Euthymic ? ?Description of Group:   ? Patients in this group will discuss emotions they experience before and after a relapse. They will process how experiencing these feelings, or avoidance of experiencing them, relates to having a relapse. Facilitator will guide patients to explore emotions they have related to recovery. Patients will be encouraged to process which emotions are more powerful. They will be guided to discuss the emotional reaction significant others in their lives may have to patients? relapse or recovery. Patients will be assisted in exploring ways to respond to the emotions of others without this contributing to a relapse. ? ?Therapeutic Goals: ?Patient will identify two or more emotions that lead to relapse for them:  ?Patient will identify two emotions that result when they relapse:  ?Patient will identify two emotions related to recovery:  ?Patient will demonstrate ability to communicate their needs through discussion and/or role plays. ? ? ?Summary of Patient Progress: Patient identified feelings of depression, when people insult her intelligence, and not staying current with psychotropic medication as triggers that lead her to relapse. Patient also identified specific people and places that led her to relapse prior to her current hospitalization. Patient identified a positive support and positive places that are conducive to recovery, including the homeless shelter, the [employer], [employer], and [place]. Patient shared about a specific NA meeting time and location that she plans to go to once she is discharged.  ? ? ? ? ?Therapeutic Modalities:   ?Cognitive Behavioral Therapy ?Solution-Focused Therapy ?Assertiveness Training ?Relapse Prevention Therapy ? ? ?Kenna Gilbert Aizik Reh, LCSWA ?

## 2022-01-31 DIAGNOSIS — F319 Bipolar disorder, unspecified: Secondary | ICD-10-CM | POA: Diagnosis not present

## 2022-01-31 MED ORDER — METRONIDAZOLE 500 MG PO TABS: 500.0000 mg | ORAL_TABLET | Freq: Two times a day (BID) | ORAL | Status: DC

## 2022-01-31 MED ORDER — METRONIDAZOLE 500 MG PO TABS
500.0000 mg | ORAL_TABLET | Freq: Two times a day (BID) | ORAL | Status: AC
  Administered 2022-01-31 – 2022-02-04 (×10): 500 mg via ORAL
  Filled 2022-01-31 (×10): qty 1

## 2022-01-31 NOTE — Progress Notes (Signed)
[facility] MD Progress Note ? ?01/31/2022 9:06 AM ?Autumn [name]  ?MRN:  [id number] ? ?Principal Problem: Bipolar 1 disorder ([place]) ?Diagnosis: Principal Problem: ?  Bipolar 1 disorder ([place]) ?Active Problems: ?  Amphetamine use disorder, severe ([place]) ?  Opioid use disorder, severe, dependence ([place]) ?  Cannabis use disorder, severe, dependence ([place]) ?  Opiate abuse, continuous ([place]) ?  Cocaine abuse ([place]) ? ?Patient is a  46y.o. female who presents to the [facility] unit due to exacerbation of bipolar disorder. ? ?Interval History ?Patient was seen today for re-evaluation.  Nursing reports no events overnight. The patient has no issues with performing ADLs.  Patient has been medication compliant.   ? ?Subjective:  On assessment patient complaints of yellow vaginal discharge and foul odor. She reports partial improvement after the medication she was given to her for that last week (Metronidazole '2000mg'$  po once), although states "it came back". Med options discussed; patient  declined offered vaginal cream formulation. Will restart oral Metronidazole for 5 days.  Patient says "I feel pretty good besides that" and denies any mental complaints. He r mood remains "good" . She denies current suicidal/homicidal ideations. She denies auditory/visual hallucinations. ?The patient reports no side effects from medications.   ? ?Labs: no new results for review. ? ?  ? ? ?Total Time spent with patient: 20 minutes ? ?Past Psychiatric History: see H&P ? ? ?Past Medical History:  ?Past Medical History:  ?Diagnosis Date  ? Allergy   ? Seasonal, Ultram (seizures)  ? Anemia 2005  ? Bipolar 1 disorder ([place])   ? Glaucoma   ? Seizures ([place]) 2008  ?  ?Past Surgical History:  ?Procedure Laterality Date  ? BREAST EXCISIONAL BIOPSY Right   ? x 2  ? CESAREAN SECTION    ? x 2  ? LEEP  2000  ? ?Family History:  ?Family History  ?Problem Relation Age of Onset  ? Hypertension Mother   ? Hyperlipidemia Mother   ? Asthma Mother   ? Parkinson's disease Father   ? Lung  cancer Maternal Grandmother   ? Other Paternal Grandfather 2  ?     Cardiac arrest  ? Heart disease Paternal Grandfather   ? Tourette syndrome Daughter   ? ?Family Psychiatric  History: see H&P ? ?Social History:  ?Social History  ? ?Substance and Sexual Activity  ?Alcohol Use Yes  ? Alcohol/week: 4.0 standard drinks  ? Types: 4 Cans of beer per week  ? Comment: Ocassional  ?   ?Social History  ? ?Substance and Sexual Activity  ?Drug Use Yes  ? Types: Marijuana  ? Comment: percocet -last use over 1 week ago. Heroin-last used 2/11  ?  ?Social History  ? ?Socioeconomic History  ? Marital status: Legally Separated  ?  Spouse name: Not on file  ? Number of children: Not on file  ? Years of education: Not on file  ? Highest education level: Not on file  ?Occupational History  ? Not on file  ?Tobacco Use  ? Smoking status: Every Day  ?  Packs/day: 1.00  ?  Years: 9.00  ?  Pack years: 9.00  ?  Types: E-cigarettes, Cigarettes  ? Smokeless tobacco: Never  ?Vaping Use  ? Vaping Use: Never used  ?Substance and Sexual Activity  ? Alcohol use: Yes  ?  Alcohol/week: 4.0 standard drinks  ?  Types: 4 Cans of beer per week  ?  Comment: Ocassional  ? Drug use: Yes  ?  Types: Marijuana  ?  Comment: percocet -last use over 1 week ago. Heroin-last used 2/11  ? Sexual activity: Yes  ?  Birth control/protection: None  ?Other Topics Concern  ? Not on file  ?Social History Narrative  ? Not on file  ? ?Social Determinants of Health  ? ?Financial Resource Strain: Not on file  ?Food Insecurity: Not on file  ?Transportation Needs: Not on file  ?Physical Activity: Not on file  ?Stress: Not on file  ?Social Connections: Not on file  ? ?Additional Social History:  ?  ?  ?  ?  ?  ?  ?  ?  ?  ?  ?  ? ?Sleep: Fair ? ?Appetite:  Fair ? ?Current Medications: ?Current Facility-Administered Medications  ?Medication Dose Route Frequency Provider Last Rate Last Admin  ? acetaminophen (TYLENOL) tablet 650 mg  650 mg Oral Q6H PRN Sherlon Handing, NP    650 mg at 01/31/22 1610  ? alum & mag hydroxide-simeth (MAALOX/MYLANTA) 200-200-20 MG/5ML suspension 30 mL  30 mL Oral Q4H PRN Sherlon Handing, NP      ? ARIPiprazole (ABILIFY) tablet 15 mg  15 mg Oral Daily Clapacs, [name], MD   15 mg at 01/31/22 9604  ? divalproex (DEPAKOTE) DR tablet 750 mg  750 mg Oral Q12H Waldon Merl F, NP   750 mg at 01/31/22 5409  ? feeding supplement (ENSURE ENLIVE / ENSURE PLUS) liquid 237 mL  237 mL Oral BID BM Clapacs, [name], MD   237 mL at 01/30/22 1413  ? lithium carbonate (LITHOBID) CR tablet 600 mg  600 mg Oral Q12H Waldon Merl F, NP   600 mg at 01/31/22 8119  ? magnesium hydroxide (MILK OF MAGNESIA) suspension 30 mL  30 mL Oral Daily PRN Waldon Merl F, NP      ? metroNIDAZOLE (FLAGYL) tablet 500 mg  500 mg Oral Q12H Joyce Leckey, MD      ? nicotine (NICODERM CQ - dosed in mg/24 hours) patch 21 mg  21 mg Transdermal Daily Waldon Merl F, NP   21 mg at 01/31/22 1478  ? QUEtiapine (SEROQUEL) tablet 400 mg  400 mg Oral QHS Clapacs, [name], MD   400 mg at 01/30/22 2202  ? ? ?Lab Results: No results found for this or any previous visit (from the past 48 hour(s)). ? ?Blood Alcohol level:  ?Lab Results  ?Component Value Date  ? ETH <10 01/19/2022  ? ETH <10 12/24/2021  ? ? ?Metabolic Disorder Labs: ?Lab Results  ?Component Value Date  ? HGBA1C 4.9 12/25/2021  ? MPG 93.93 12/25/2021  ? MPG 114 06/02/2021  ? ?No results found for: PROLACTIN ?Lab Results  ?Component Value Date  ? CHOL 160 12/25/2021  ? TRIG 95 12/25/2021  ? HDL 58 12/25/2021  ? CHOLHDL 2.8 12/25/2021  ? VLDL 19 12/25/2021  ? [place] 83 12/25/2021  ? [place] 63 06/01/2021  ? ? ?Physical Findings: ?AIMS:  , ,  ,  ,    ?CIWA:  CIWA-Ar Total: 2 ?COWS:    ? ?Musculoskeletal: ?Strength & Muscle Tone: within normal limits ?Gait & Station: normal ?Patient leans: N/A ? ?Psychiatric Specialty Exam: ? ?Presentation  ?General Appearance: disheveled ?Eye Contact:Good ? ?Speech: wnl ?Speech  Volume:Decreased ? ?Handedness:Right ? ? ?Mood and Affect  ?Mood: anxiuous ?Affect:restricted ? ?Thought Process  ?Thought Processes: organized somewhat ?Descriptions of Associations: wnl ?Orientation: full, AAO x3 ? ?Thought Content: preoccupations ?History of Schizophrenia/Schizoaffective disorder:No ? ?Duration of Psychotic Symptoms:Greater than six months ? ?Hallucinations:  denies ?Ideas of Reference: does not express ?] ?Suicidal Thoughts: denies ?Homicidal Thoughts: denies ? ?Sensorium  ?Memory: fair ? ?Judgment: fair ?Insight: fair ? ?Executive Functions  ?Concentration: fair ?Attention Span:fair ?Recall: fair ?Fund of Knowledge: fair ?Language: good ? ?Psychomotor Activity  ?Psychomotor Activity: no PMA, no PMR ? ?Assets  ?Assets:Communication Skills; Desire for Improvement; Resilience; Social Support ? ? ?Sleep  ?Sleep:No data recorded ? ? ?Physical Exam: ?Physical Exam ?ROS ?Blood pressure 94/66, pulse 60, temperature 97.9 ?F (36.6 ?C), temperature source Oral, resp. rate 18, height '5\' 1"'$  (1.549 m), weight 46.3 kg, SpO2 97 %. Body mass index is 19.27 kg/m?. ? ? ?Treatment Plan Summary: ?Daily contact with patient to assess and evaluate symptoms and progress in treatment and Medication management ? ?Patient is a 46 year old female with the above-stated past psychiatric history who is seen in follow-up.  Chart reviewed. Patient discussed with nursing. Patient appears calm. Compliant with medicines. No changes in psychotropic medicines made today. Patient is waiting for appropriate placement. ?Patient is complaining of vaginal discharge, discomfort and odor. Will restart oral Metronidazole for 5 days (patient declined cream formulation); would consider checking Lithium level in a few days.  ? ?  ?Plan: ? ?-continue inpatient psych admission; 15-minute checks; daily contact with patient to assess and evaluate symptoms and progress in treatment; psychoeducation. ? ?-continue scheduled medications: ?  ARIPiprazole  15 mg Oral Daily  ? divalproex  750 mg Oral Q12H  ? feeding supplement  237 mL Oral BID BM  ? lithium carbonate  600 mg Oral Q12H  ? metroNIDAZOLE  500 mg Oral Q12H  ? nicotine  21 mg Transdermal Daily  ? QUEtiapine  400 mg O

## 2022-01-31 NOTE — Progress Notes (Signed)
The patient was cooperative with treatment on shift, she denies SI,HI & AVH. She spent most of the evening resting in bed with no new complaints or behavioral issues to report at this time.  ?

## 2022-01-31 NOTE — Plan of Care (Signed)
D- Patient alert and oriented. Patient presented in an anxious, but pleasant mood on assessment stating that she slept good last night and had complaints of a toothache. Patient rated her pain a "5/10", in which she requested PRN pain medication from this Probation officer. Patient denies any signs/symptoms of depression, however, she endorsed anxiety. Patient rated her anxiety a "2/10", stating "people I don't know coming on the unit", is making her feel this way. Patient did not request any PRN medication for anxiety from this writer. Patient also denies SI, HI, AVH at this time. Patient's goal for today is to "go to group", in which she will "get out of bed", in order to achieve her goal. ? ?A- Scheduled medications administered to patient, per MD orders. Support and encouragement provided.  Routine safety checks conducted every 15 minutes.  Patient informed to notify staff with problems or concerns. ? ?R- No adverse drug reactions noted. Patient contracts for safety at this time. Patient compliant with medications and treatment plan. Patient receptive, calm, and cooperative. Patient interacts well with others on the unit.  Patient remains safe at this time. ? ?Problem: Education: ?Goal: Knowledge of [facility] Education information/materials will improve ?Outcome: Progressing ?Goal: Emotional status will improve ?Outcome: Progressing ?Goal: Mental status will improve ?Outcome: Progressing ?Goal: Verbalization of understanding the information provided will improve ?Outcome: Progressing ?  ?Problem: Health Behavior/Discharge Planning: ?Goal: Identification of resources available to assist in meeting health care needs will improve ?Outcome: Progressing ?Goal: Compliance with treatment plan for underlying cause of condition will improve ?Outcome: Progressing ?  ?Problem: Education: ?Goal: Utilization of techniques to improve thought processes will improve ?Outcome: Progressing ?Goal: Knowledge of the prescribed  therapeutic regimen will improve ?Outcome: Progressing ?  ?Problem: Activity: ?Goal: Interest or engagement in leisure activities will improve ?Outcome: Progressing ?Goal: Imbalance in normal sleep/wake cycle will improve ?Outcome: Progressing ?  ?Problem: Coping: ?Goal: Coping ability will improve ?Outcome: Progressing ?Goal: Will verbalize feelings ?Outcome: Progressing ?  ?Problem: Role Relationship: ?Goal: Will demonstrate positive changes in social behaviors and relationships ?Outcome: Progressing ?  ?Problem: Safety: ?Goal: Ability to disclose and discuss suicidal ideas will improve ?Outcome: Progressing ?Goal: Ability to identify and utilize support systems that promote safety will improve ?Outcome: Progressing ?  ?Problem: Self-Concept: ?Goal: Will verbalize positive feelings about self ?Outcome: Progressing ?Goal: Level of anxiety will decrease ?Outcome: Progressing ?  ?

## 2022-01-31 NOTE — Progress Notes (Signed)
Patient has been in her room for majority of the day. She did come out for meals, medication, and to make phone calls. She has also been observed sitting quietly in the dayroom, watching television. Patient remains safe on the unit.  ?

## 2022-01-31 NOTE — Group Note (Signed)
LCSW Group Therapy Note ? ?Group Date: 01/31/2022 ?Start Time: 1300 ?End Time: 1400 ? ? ?Type of Therapy and Topic:  Group Therapy - Healthy vs Unhealthy Coping Skills ? ?Participation Level:  Did Not Attend  ? ?Description of Group ?The focus of this group was to determine what unhealthy coping techniques typically are used by group members and what healthy coping techniques would be helpful in coping with various problems. Patients were guided in becoming aware of the differences between healthy and unhealthy coping techniques. Patients were asked to identify 2-3 healthy coping skills they would like to learn to use more effectively. ? ?Therapeutic Goals ?Patients learned that coping is what human beings do all day long to deal with various situations in their lives ?Patients defined and discussed healthy vs unhealthy coping techniques ?Patients identified their preferred coping techniques and identified whether these were healthy or unhealthy ?Patients determined 2-3 healthy coping skills they would like to become more familiar with and use more often. ?Patients provided support and ideas to each other ? ? ?Summary of Patient Progress: Due to limited staffing, group was not held on the unit. ? ? ?Therapeutic Modalities ?Cognitive Behavioral Therapy ?Motivational Interviewing ? ?Kenna Gilbert [place], LCSWA ?01/31/2022  2:56 PM   ?

## 2022-02-01 DIAGNOSIS — F319 Bipolar disorder, unspecified: Secondary | ICD-10-CM | POA: Diagnosis not present

## 2022-02-01 NOTE — BH IP Treatment Plan (Signed)
Interdisciplinary Treatment and Diagnostic Plan Update ? ?02/01/2022 ?Time of Session: 8:30AM ?Shalece Staffa ?MRN: [id number] ? ?Principal Diagnosis: Bipolar 1 disorder ([place]) ? ?Secondary Diagnoses: Principal Problem: ?  Bipolar 1 disorder ([place]) ?Active Problems: ?  Amphetamine use disorder, severe ([place]) ?  Opioid use disorder, severe, dependence ([place]) ?  Cannabis use disorder, severe, dependence ([place]) ?  Opiate abuse, continuous ([place]) ?  Cocaine abuse ([place]) ? ? ?Current Medications:  ?Current Facility-Administered Medications  ?Medication Dose Route Frequency Provider Last Rate Last Admin  ? acetaminophen (TYLENOL) tablet 650 mg  650 mg Oral Q6H PRN Sherlon Handing, NP   650 mg at 01/31/22 3329  ? alum & mag hydroxide-simeth (MAALOX/MYLANTA) 200-200-20 MG/5ML suspension 30 mL  30 mL Oral Q4H PRN Sherlon Handing, NP      ? ARIPiprazole (ABILIFY) tablet 15 mg  15 mg Oral Daily Clapacs, [name], MD   15 mg at 02/01/22 0755  ? divalproex (DEPAKOTE) DR tablet 750 mg  750 mg Oral Q12H Waldon Merl F, NP   750 mg at 02/01/22 0754  ? feeding supplement (ENSURE ENLIVE / ENSURE PLUS) liquid 237 mL  237 mL Oral BID BM Clapacs, [name], MD   237 mL at 01/31/22 1500  ? lithium carbonate (LITHOBID) CR tablet 600 mg  600 mg Oral Q12H Waldon Merl F, NP   600 mg at 02/01/22 5188  ? magnesium hydroxide (MILK OF MAGNESIA) suspension 30 mL  30 mL Oral Daily PRN Waldon Merl F, NP      ? metroNIDAZOLE (FLAGYL) tablet 500 mg  500 mg Oral Q12H Paliy, Delrae Rend, MD   500 mg at 02/01/22 0754  ? nicotine (NICODERM CQ - dosed in mg/24 hours) patch 21 mg  21 mg Transdermal Daily Waldon Merl F, NP   21 mg at 02/01/22 0755  ? QUEtiapine (SEROQUEL) tablet 400 mg  400 mg Oral QHS Clapacs, [name], MD   400 mg at 01/31/22 2118  ? ?PTA Medications: ?Medications Prior to Admission  ?Medication Sig Dispense Refill Last Dose  ? ARIPiprazole (ABILIFY) 30 MG tablet Take 1 tablet (30 mg total) by mouth daily. 30 tablet 1   ? divalproex  (DEPAKOTE) 250 MG DR tablet Take 3 tablets (750 mg total) by mouth every 12 (twelve) hours. 180 tablet 1   ? hydrOXYzine (ATARAX) 50 MG tablet Take 1 tablet (50 mg total) by mouth every 6 (six) hours as needed for anxiety. 60 tablet 1   ? lithium carbonate (LITHOBID) 300 MG CR tablet Take 2 tablets (600 mg total) by mouth every 12 (twelve) hours. 120 tablet 1   ? nicotine (NICODERM CQ - DOSED IN MG/24 HOURS) 21 mg/24hr patch Place 1 patch (21 mg total) onto the skin daily at 6 (six) AM. 28 patch 1   ? QUEtiapine 150 MG TABS Take 150 mg by mouth at bedtime. 30 tablet 1   ? ? ?Patient Stressors: Financial difficulties   ?Medication change or noncompliance   ?Substance abuse   ?Traumatic event   ? ?Patient Strengths: Average or above average intelligence  ?Communication skills  ?Physical Health  ? ?Treatment Modalities: Medication Management, Group therapy, Case management,  ?1 to 1 session with clinician, Psychoeducation, Recreational therapy. ? ? ?Physician Treatment Plan for Primary Diagnosis: Bipolar 1 disorder ([place]) ?Long Term Goal(s): Improvement in symptoms so as ready for discharge  ? ?Short Term Goals: Compliance with prescribed medications will improve ?Ability to identify triggers associated with substance abuse/mental health issues will improve ?  Ability to verbalize feelings will improve ?Ability to disclose and discuss suicidal ideas ?Ability to demonstrate self-control will improve ? ?Medication Management: Evaluate patient's response, side effects, and tolerance of medication regimen. ? ?Therapeutic Interventions: 1 to 1 sessions, Unit Group sessions and Medication administration. ? ?Evaluation of Outcomes: Progressing ? ?Physician Treatment Plan for Secondary Diagnosis: Principal Problem: ?  Bipolar 1 disorder ([employer]) ?Active Problems: ?  Amphetamine use disorder, severe ([place]) ?  Opioid use disorder, severe, dependence ([place]) ?  Cannabis use disorder, severe, dependence ([place]) ?  Opiate abuse, continuous  ([place]) ?  Cocaine abuse ([place]) ? ?Long Term Goal(s): Improvement in symptoms so as ready for discharge  ? ?Short Term Goals: Compliance with prescribed medications will improve ?Ability to identify triggers associated with substance abuse/mental health issues will improve ?Ability to verbalize feelings will improve ?Ability to disclose and discuss suicidal ideas ?Ability to demonstrate self-control will improve    ? ?Medication Management: Evaluate patient's response, side effects, and tolerance of medication regimen. ? ?Therapeutic Interventions: 1 to 1 sessions, Unit Group sessions and Medication administration. ? ?Evaluation of Outcomes: Progressing ? ? ?RN Treatment Plan for Primary Diagnosis: Bipolar 1 disorder ([place]) ?Long Term Goal(s): Knowledge of disease and therapeutic regimen to maintain health will improve ? ?Short Term Goals: Ability to demonstrate self-control, Ability to participate in decision making will improve, Ability to verbalize feelings will improve, Ability to disclose and discuss suicidal ideas, Ability to identify and develop effective coping behaviors will improve, and Compliance with prescribed medications will improve ? ?Medication Management: RN will administer medications as ordered by provider, will assess and evaluate patient's response and provide education to patient for prescribed medication. RN will report any adverse and/or side effects to prescribing provider. ? ?Therapeutic Interventions: 1 on 1 counseling sessions, Psychoeducation, Medication administration, Evaluate responses to treatment, Monitor vital signs and CBGs as ordered, Perform/monitor CIWA, COWS, AIMS and Fall Risk screenings as ordered, Perform wound care treatments as ordered. ? ?Evaluation of Outcomes: Progressing ? ? ?LCSW Treatment Plan for Primary Diagnosis: Bipolar 1 disorder ([place]) ?Long Term Goal(s): Safe transition to appropriate next level of care at discharge, Engage patient in therapeutic group addressing  interpersonal concerns. ? ?Short Term Goals: Engage patient in aftercare planning with referrals and resources, Increase social support, Increase ability to appropriately verbalize feelings, Increase emotional regulation, Facilitate acceptance of mental health diagnosis and concerns, and Increase skills for wellness and recovery ? ?Therapeutic Interventions: Assess for all discharge needs, 1 to 1 time with Education officer, museum, Explore available resources and support systems, Assess for adequacy in community support network, Educate family and significant other(s) on suicide prevention, Complete Psychosocial Assessment, Interpersonal group therapy. ? ?Evaluation of Outcomes: Progressing ? ? ?Progress in Treatment: ?Attending groups: Yes. ?Participating in groups: Yes. ?Taking medication as prescribed: Yes. ?Toleration medication: Yes. ?Family/Significant other contact made: Yes, individual(s) contacted:  SPE completed with the patient.  Patient identified her mother as a contact, however mother has not been reached and has not returned call.  ?Patient understands diagnosis: Yes. ?Discussing patient identified problems/goals with staff: Yes. ?Medical problems stabilized or resolved: Yes. ?Denies suicidal/homicidal ideation: Yes. ?Issues/concerns per patient self-inventory: No. ?Other: none ? ?New problem(s) identified: No, Describe:  none ? ?New Short Term/Long Term Goal(s): elimination of symptoms of psychosis, medication management for mood stabilization; elimination of SI thoughts; development of comprehensive mental wellness/sobriety plan.  Update 01/27/2022: No changes at this time. Update 02/01/2022:  No changes at this time.   ?  ?Patient Goals:  "  attend group and get as much out of it as I can"  Update 01/27/2022: No changes at this time. Update 02/01/2022:  No changes at this time.   ?  ?Discharge Plan or Barriers: Patient reports that she is unable to return to her home and needs assistance in locating somewhere else  to live at discharge.  She is requesting assistance in getting in at the homeless shelter in [facility], [facility].  CSW to assist.  Update 01/27/2022: Patient reports that she would like to be

## 2022-02-01 NOTE — Progress Notes (Signed)
Patient pleasant and cooperative. Appropriate with staff and peers.  Noted in room most of early shift. Out for meds, phone calls and snack. Denies any SI, HI, AVH. No complaints or concerns voiced. Possible discharge on Wednesday. Currently in bed resting, eyes closed. Medications given as prescribed. Patient receptive and remains safe on unit with q 15 min checks. ?

## 2022-02-01 NOTE — Plan of Care (Signed)
Patient appropriate with staff & peers. Pleasant and cooperative on approach.Participated in unit activities. Denies SI,HI and AVH. Appetite and energy level good. ADLs maintained. Support and encouragement given. ?

## 2022-02-01 NOTE — Progress Notes (Signed)
The patient was cooperative with treatment on shift, she denies SI,HI & AVH. The patient reports being anxious about discharge planning, due to being homeless. She spent most of the evening resting in bed with no new complaints or behavioral issues to report at this time.   ?  ?  ?  ? ?

## 2022-02-01 NOTE — Group Note (Signed)
LCSW Group Therapy Note ? ?Group Date: 02/01/2022 ?Start Time: 1300 ?End Time: 1400 ? ? ?Type of Therapy and Topic:  Group Therapy - How To Cope with Nervousness about Discharge  ? ?Participation Level:  Active  ? ?Description of Group ?This process group involved identification of patients' feelings about discharge. Some of them are scheduled to be discharged soon, while others are new admissions, but each of them was asked to share thoughts and feelings surrounding discharge from the hospital. One common theme was that they are excited at the prospect of going home, while another was that many of them are apprehensive about sharing why they were hospitalized. Patients were given the opportunity to discuss these feelings with their peers in preparation for discharge. ? ?Therapeutic Goals ? ?Patient will identify their overall feelings about pending discharge. ?Patient will think about how they might proactively address issues that they believe will once again arise once they get home (i.e. with parents). ?Patients will participate in discussion about having hope for change. ? ? ?Summary of Patient Progress:  Patient was present for the entirety of the group session. Patient was an active listener and participated in the topic of discussion, provided helpful advice to others, and added nuance to topic of conversation. Patient shared she would like to remain sober from stimulants, opiates, and alcohol after discharge. Plans on going to [facility] [employer] after discharge who will support her in her recovery.  ? ? ?Therapeutic Modalities ?Cognitive Behavioral Therapy ? ? ?Durenda Hurt, LCSWA ?02/01/2022  3:25 PM   ?

## 2022-02-01 NOTE — Progress Notes (Signed)
Recreation Therapy Notes ? ?Date: 02/01/2022 ? ?Time: 10:40am  ? ?Location: Craft room     ? ?Behavioral response: N/A ?  ?Intervention Topic: Time Management   ? ?Discussion/Intervention: ?Patient refused to attend group.  ? ?Clinical Observations/Feedback:  ?Patient refused to attend group.  ?  ?Lorilyn Laitinen LRT/CTRS ? ? ? ? ? ? ? ?Joniqua Sidle ?02/01/2022 11:59 AM ?

## 2022-02-01 NOTE — Plan of Care (Signed)
?  Problem: Education: ?Goal: Emotional status will improve ?Outcome: Progressing ?Goal: Mental status will improve ?Outcome: Progressing ?Goal: Verbalization of understanding the information provided will improve ?Outcome: Progressing ?  ?Problem: Health Behavior/Discharge Planning: ?Goal: Identification of resources available to assist in meeting health care needs will improve ?Outcome: Progressing ?  ?

## 2022-02-01 NOTE — Progress Notes (Signed)
[facility] MD Progress Note ? ?02/01/2022 3:12 PM ?[name]  ?MRN:  [id number] ?Subjective: Follow-up for this 46 year old with bipolar disorder and substance abuse.  Patient has no new complaints.  She attends some groups.  Interacts with others appropriately.  Not having any current behavior problems.  Does not show acute signs of psychotic symptoms.  Says her mood is feeling pretty stable.  No acute physical complaints ?Principal Problem: Bipolar 1 disorder ([place]) ?Diagnosis: Principal Problem: ?  Bipolar 1 disorder ([place]) ?Active Problems: ?  Amphetamine use disorder, severe ([place]) ?  Opioid use disorder, severe, dependence ([place]) ?  Cannabis use disorder, severe, dependence ([place]) ?  Opiate abuse, continuous ([place]) ?  Cocaine abuse ([place]) ? ?Total Time spent with patient: 30 minutes ? ?Past Psychiatric History: Past history of recurrent psychotic symptoms and mood symptoms and substance abuse ? ?Past Medical History:  ?Past Medical History:  ?Diagnosis Date  ? Allergy   ? Seasonal, Ultram (seizures)  ? Anemia 2005  ? Bipolar 1 disorder ([place])   ? Glaucoma   ? Seizures ([place]) 2008  ?  ?Past Surgical History:  ?Procedure Laterality Date  ? BREAST EXCISIONAL BIOPSY Right   ? x 2  ? CESAREAN SECTION    ? x 2  ? LEEP  2000  ? ?Family History:  ?Family History  ?Problem Relation Age of Onset  ? Hypertension Mother   ? Hyperlipidemia Mother   ? Asthma Mother   ? Parkinson's disease Father   ? Lung cancer Maternal Grandmother   ? Other Paternal Grandfather 79  ?     Cardiac arrest  ? Heart disease Paternal Grandfather   ? Tourette syndrome Daughter   ? ?Family Psychiatric  History: See previous ?Social History:  ?Social History  ? ?Substance and Sexual Activity  ?Alcohol Use Yes  ? Alcohol/week: 4.0 standard drinks  ? Types: 4 Cans of beer per week  ? Comment: Ocassional  ?   ?Social History  ? ?Substance and Sexual Activity  ?Drug Use Yes  ? Types: Marijuana  ? Comment: percocet -last use over 1 week ago. Heroin-last used 2/11  ?   ?Social History  ? ?Socioeconomic History  ? Marital status: Legally Separated  ?  Spouse name: Not on file  ? Number of children: Not on file  ? Years of education: Not on file  ? Highest education level: Not on file  ?Occupational History  ? Not on file  ?Tobacco Use  ? Smoking status: Every Day  ?  Packs/day: 1.00  ?  Years: 9.00  ?  Pack years: 9.00  ?  Types: E-cigarettes, Cigarettes  ? Smokeless tobacco: Never  ?Vaping Use  ? Vaping Use: Never used  ?Substance and Sexual Activity  ? Alcohol use: Yes  ?  Alcohol/week: 4.0 standard drinks  ?  Types: 4 Cans of beer per week  ?  Comment: Ocassional  ? Drug use: Yes  ?  Types: Marijuana  ?  Comment: percocet -last use over 1 week ago. Heroin-last used 2/11  ? Sexual activity: Yes  ?  Birth control/protection: None  ?Other Topics Concern  ? Not on file  ?Social History Narrative  ? Not on file  ? ?Social Determinants of Health  ? ?Financial Resource Strain: Not on file  ?Food Insecurity: Not on file  ?Transportation Needs: Not on file  ?Physical Activity: Not on file  ?Stress: Not on file  ?Social Connections: Not on file  ? ?Additional Social History:  ?  ?  ?  ?  ?  ?  ?  ?  ?  ?  ?  ? ?  Sleep: Fair ? ?Appetite:  Fair ? ?Current Medications: ?Current Facility-Administered Medications  ?Medication Dose Route Frequency Provider Last Rate Last Admin  ? acetaminophen (TYLENOL) tablet 650 mg  650 mg Oral Q6H PRN Sherlon Handing, NP   650 mg at 01/31/22 0277  ? alum & mag hydroxide-simeth (MAALOX/MYLANTA) 200-200-20 MG/5ML suspension 30 mL  30 mL Oral Q4H PRN Sherlon Handing, NP      ? ARIPiprazole (ABILIFY) tablet 15 mg  15 mg Oral Daily Tani Virgo, [name], MD   15 mg at 02/01/22 0755  ? divalproex (DEPAKOTE) DR tablet 750 mg  750 mg Oral Q12H Waldon Merl F, NP   750 mg at 02/01/22 0754  ? feeding supplement (ENSURE ENLIVE / ENSURE PLUS) liquid 237 mL  237 mL Oral BID BM Alannie Amodio T, MD   237 mL at 02/01/22 1029  ? lithium carbonate (LITHOBID) CR tablet 600  mg  600 mg Oral Q12H Waldon Merl F, NP   600 mg at 02/01/22 4128  ? magnesium hydroxide (MILK OF MAGNESIA) suspension 30 mL  30 mL Oral Daily PRN Waldon Merl F, NP      ? metroNIDAZOLE (FLAGYL) tablet 500 mg  500 mg Oral Q12H Paliy, Delrae Rend, MD   500 mg at 02/01/22 0754  ? nicotine (NICODERM CQ - dosed in mg/24 hours) patch 21 mg  21 mg Transdermal Daily Waldon Merl F, NP   21 mg at 02/01/22 0755  ? QUEtiapine (SEROQUEL) tablet 400 mg  400 mg Oral QHS Prestyn Stanco, [name], MD   400 mg at 01/31/22 2118  ? ? ?Lab Results: No results found for this or any previous visit (from the past 48 hour(s)). ? ?Blood Alcohol level:  ?Lab Results  ?Component Value Date  ? ETH <10 01/19/2022  ? ETH <10 12/24/2021  ? ? ?Metabolic Disorder Labs: ?Lab Results  ?Component Value Date  ? HGBA1C 4.9 12/25/2021  ? MPG 93.93 12/25/2021  ? MPG 114 06/02/2021  ? ?No results found for: PROLACTIN ?Lab Results  ?Component Value Date  ? CHOL 160 12/25/2021  ? TRIG 95 12/25/2021  ? HDL 58 12/25/2021  ? CHOLHDL 2.8 12/25/2021  ? VLDL 19 12/25/2021  ? [place] 83 12/25/2021  ? [place] 63 06/01/2021  ? ? ?Physical Findings: ?AIMS:  , ,  ,  ,    ?CIWA:  CIWA-Ar Total: 2 ?COWS:    ? ?Musculoskeletal: ?Strength & Muscle Tone: within normal limits ?Gait & Station: normal ?Patient leans: N/A ? ?Psychiatric Specialty Exam: ? ?Presentation  ?General Appearance: Bizarre; Disheveled ? ?Eye Contact:Good ? ?Speech:Pressured; Slurred ? ?Speech Volume:Decreased ? ?Handedness:Right ? ? ?Mood and Affect  ?Mood:Euphoric ? ?Affect:Inappropriate; Full Range ? ? ?Thought Process  ?Thought Processes:Disorganized ? ?Descriptions of Associations:Tangential ? ?Orientation:Partial ? ?Thought Content:Obsessions; Paranoid Ideation; Illogical ? ?History of Schizophrenia/Schizoaffective disorder:No ? ?Duration of Psychotic Symptoms:Greater than six months ? ?Hallucinations:No data recorded ?Ideas of Reference:Delusions ? ?Suicidal Thoughts:No data recorded ?Homicidal  Thoughts:No data recorded ? ?Sensorium  ?Memory:Immediate Poor; Recent Poor; Remote Poor ? ?Judgment:Poor ? ?Insight:Poor ? ? ?Executive Functions  ?Concentration:Poor ? ?Attention Span:Poor ? ?Recall:Poor ? ?Fund of Knowledge:Poor ? ?Language:Poor ? ? ?Psychomotor Activity  ?Psychomotor Activity:No data recorded ? ?Assets  ?Assets:Communication Skills; Desire for Improvement; Resilience; Social Support ? ? ?Sleep  ?Sleep:No data recorded ? ? ?Physical Exam: ?Physical Exam ?Vitals and nursing note reviewed.  ?Constitutional:   ?   Appearance: Normal appearance.  ?HENT:  ?   Head: Normocephalic and atraumatic.  ?  Mouth/Throat:  ?   Pharynx: Oropharynx is clear.  ?Eyes:  ?   Pupils: Pupils are equal, round, and reactive to light.  ?Cardiovascular:  ?   Rate and Rhythm: Normal rate and regular rhythm.  ?Pulmonary:  ?   Effort: Pulmonary effort is normal.  ?   Breath sounds: Normal breath sounds.  ?Abdominal:  ?   General: Abdomen is flat.  ?   Palpations: Abdomen is soft.  ?Musculoskeletal:     ?   General: Normal range of motion.  ?Skin: ?   General: Skin is warm and dry.  ?Neurological:  ?   General: No focal deficit present.  ?   Mental Status: She is alert. Mental status is at baseline.  ?Psychiatric:     ?   Attention and Perception: Attention normal.     ?   Mood and Affect: Mood normal.     ?   Speech: Speech normal.     ?   Behavior: Behavior is cooperative.     ?   Thought Content: Thought content normal.     ?   Cognition and Memory: Cognition normal.  ? ?Review of Systems  ?Constitutional: Negative.   ?HENT: Negative.    ?Eyes: Negative.   ?Respiratory: Negative.    ?Cardiovascular: Negative.   ?Gastrointestinal: Negative.   ?Musculoskeletal: Negative.   ?Skin: Negative.   ?Neurological: Negative.   ?Psychiatric/Behavioral: Negative.    ?Blood pressure (!) 87/57, pulse 66, temperature 97.8 ?F (36.6 ?C), temperature source Oral, resp. rate 18, height '5\' 1"'$  (1.549 m), weight 46.3 kg, SpO2 98 %. Body mass  index is 19.27 kg/m?. ? ? ?Treatment Plan Summary: ?Medication management and Plan no change recommended to medication.  We are waiting on the local shelter to let us know if there is a bed available as t

## 2022-02-02 DIAGNOSIS — F319 Bipolar disorder, unspecified: Secondary | ICD-10-CM | POA: Diagnosis not present

## 2022-02-02 NOTE — Group Note (Cosign Needed)
[place] LCSW Group Therapy Note ? ? ?Group Date: 02/02/2022 ?Start Time: 1300 ?End Time: 1400 ? ? ?Type of Therapy/Topic:  Group Therapy:  Balance in Life ? ?Participation Level:  Active  ? ?Description of Group:   ? This group will address the concept of balance and how it feels and looks when one is unbalanced. Patients will be encouraged to process areas in their lives that are out of balance, and identify reasons for remaining unbalanced. Facilitators will guide patients utilizing problem- solving interventions to address and correct the stressor making their life unbalanced. Understanding and applying boundaries will be explored and addressed for obtaining  and maintaining a balanced life. Patients will be encouraged to explore ways to assertively make their unbalanced needs known to significant others in their lives, using other group members and facilitator for support and feedback. ? ?Therapeutic Goals: ?Patient will identify two or more emotions or situations they have that consume much of in their lives. ?Patient will identify signs/triggers that life has become out of balance:  ?Patient will identify two ways to set boundaries in order to achieve balance in their lives:  ?Patient will demonstrate ability to communicate their needs through discussion and/or role plays ? ?Summary of Patient Progress: ?Patient was present for the entirety of the group session. Patient was an active listener and participated in the topic of discussion, provided helpful advice to others, and added nuance to topic of conversation.   ? ? ? ? ? ? ?Therapeutic Modalities:   ?Cognitive Behavioral Therapy ?Solution-Focused Therapy ?Assertiveness Training ? ? ?Karalee Height, Student-Social Work ?

## 2022-02-02 NOTE — Progress Notes (Signed)
[facility] MD Progress Note ? ?02/02/2022 3:23 PM ?Autumn [name]  ?MRN:  [id number] ?Subjective: Follow-up for 46 year old woman with bipolar disorder.  Patient seen and chart reviewed.  Once again she has no new complaint.  Physically feeling stable.  Mentally stable.  No hallucinations no delusions.  Denies suicidal or homicidal thought.  States that her mood is feeling okay.  Does not appear to be bizarre or acting out in any dangerous manner ?Principal Problem: Bipolar 1 disorder ([place]) ?Diagnosis: Principal Problem: ?  Bipolar 1 disorder ([place]) ?Active Problems: ?  Amphetamine use disorder, severe ([place]) ?  Opioid use disorder, severe, dependence ([place]) ?  Cannabis use disorder, severe, dependence ([place]) ?  Opiate abuse, continuous ([place]) ?  Cocaine abuse ([place]) ? ?Total Time spent with patient: 30 minutes ? ?Past Psychiatric History: Past history of recurrent psychotic episodes and substance abuse with poor self-care. ? ?Past Medical History:  ?Past Medical History:  ?Diagnosis Date  ? Allergy   ? Seasonal, Ultram (seizures)  ? Anemia 2005  ? Bipolar 1 disorder ([place])   ? Glaucoma   ? Seizures ([place]) 2008  ?  ?Past Surgical History:  ?Procedure Laterality Date  ? BREAST EXCISIONAL BIOPSY Right   ? x 2  ? CESAREAN SECTION    ? x 2  ? LEEP  2000  ? ?Family History:  ?Family History  ?Problem Relation Age of Onset  ? Hypertension Mother   ? Hyperlipidemia Mother   ? Asthma Mother   ? Parkinson's disease Father   ? Lung cancer Maternal Grandmother   ? Other Paternal Grandfather 30  ?     Cardiac arrest  ? Heart disease Paternal Grandfather   ? Tourette syndrome Daughter   ? ?Family Psychiatric  History: See previous ?Social History:  ?Social History  ? ?Substance and Sexual Activity  ?Alcohol Use Yes  ? Alcohol/week: 4.0 standard drinks  ? Types: 4 Cans of beer per week  ? Comment: Ocassional  ?   ?Social History  ? ?Substance and Sexual Activity  ?Drug Use Yes  ? Types: Marijuana  ? Comment: percocet -last use over 1 week ago.  Heroin-last used 2/11  ?  ?Social History  ? ?Socioeconomic History  ? Marital status: Legally Separated  ?  Spouse name: Not on file  ? Number of children: Not on file  ? Years of education: Not on file  ? Highest education level: Not on file  ?Occupational History  ? Not on file  ?Tobacco Use  ? Smoking status: Every Day  ?  Packs/day: 1.00  ?  Years: 9.00  ?  Pack years: 9.00  ?  Types: E-cigarettes, Cigarettes  ? Smokeless tobacco: Never  ?Vaping Use  ? Vaping Use: Never used  ?Substance and Sexual Activity  ? Alcohol use: Yes  ?  Alcohol/week: 4.0 standard drinks  ?  Types: 4 Cans of beer per week  ?  Comment: Ocassional  ? Drug use: Yes  ?  Types: Marijuana  ?  Comment: percocet -last use over 1 week ago. Heroin-last used 2/11  ? Sexual activity: Yes  ?  Birth control/protection: None  ?Other Topics Concern  ? Not on file  ?Social History Narrative  ? Not on file  ? ?Social Determinants of Health  ? ?Financial Resource Strain: Not on file  ?Food Insecurity: Not on file  ?Transportation Needs: Not on file  ?Physical Activity: Not on file  ?Stress: Not on file  ?Social Connections: Not on file  ? ?  Additional Social History:  ?  ?  ?  ?  ?  ?  ?  ?  ?  ?  ?  ? ?Sleep: Fair ? ?Appetite:  Fair ? ?Current Medications: ?Current Facility-Administered Medications  ?Medication Dose Route Frequency Provider Last Rate Last Admin  ? acetaminophen (TYLENOL) tablet 650 mg  650 mg Oral Q6H PRN Sherlon Handing, NP   650 mg at 02/01/22 1712  ? alum & mag hydroxide-simeth (MAALOX/MYLANTA) 200-200-20 MG/5ML suspension 30 mL  30 mL Oral Q4H PRN Sherlon Handing, NP      ? ARIPiprazole (ABILIFY) tablet 15 mg  15 mg Oral Daily Daryn Hicks, [name], MD   15 mg at 02/02/22 0827  ? divalproex (DEPAKOTE) DR tablet 750 mg  750 mg Oral Q12H Waldon Merl F, NP   750 mg at 02/02/22 0827  ? feeding supplement (ENSURE ENLIVE / ENSURE PLUS) liquid 237 mL  237 mL Oral BID BM Makalia Bare T, MD   237 mL at 02/02/22 0900  ? lithium carbonate  (LITHOBID) CR tablet 600 mg  600 mg Oral Q12H Waldon Merl F, NP   600 mg at 02/02/22 0827  ? magnesium hydroxide (MILK OF MAGNESIA) suspension 30 mL  30 mL Oral Daily PRN Waldon Merl F, NP      ? metroNIDAZOLE (FLAGYL) tablet 500 mg  500 mg Oral Q12H Paliy, Alisa, MD   500 mg at 02/02/22 0827  ? nicotine (NICODERM CQ - dosed in mg/24 hours) patch 21 mg  21 mg Transdermal Daily Waldon Merl F, NP   21 mg at 02/02/22 0827  ? QUEtiapine (SEROQUEL) tablet 400 mg  400 mg Oral QHS Shannette Tabares, [name], MD   400 mg at 02/01/22 2107  ? ? ?Lab Results: No results found for this or any previous visit (from the past 48 hour(s)). ? ?Blood Alcohol level:  ?Lab Results  ?Component Value Date  ? ETH <10 01/19/2022  ? ETH <10 12/24/2021  ? ? ?Metabolic Disorder Labs: ?Lab Results  ?Component Value Date  ? HGBA1C 4.9 12/25/2021  ? MPG 93.93 12/25/2021  ? MPG 114 06/02/2021  ? ?No results found for: PROLACTIN ?Lab Results  ?Component Value Date  ? CHOL 160 12/25/2021  ? TRIG 95 12/25/2021  ? HDL 58 12/25/2021  ? CHOLHDL 2.8 12/25/2021  ? VLDL 19 12/25/2021  ? [place] 83 12/25/2021  ? [place] 63 06/01/2021  ? ? ?Physical Findings: ?AIMS:  , ,  ,  ,    ?CIWA:  CIWA-Ar Total: 2 ?COWS:    ? ?Musculoskeletal: ?Strength & Muscle Tone: within normal limits ?Gait & Station: normal ?Patient leans: N/A ? ?Psychiatric Specialty Exam: ? ?Presentation  ?General Appearance: Bizarre; Disheveled ? ?Eye Contact:Good ? ?Speech:Pressured; Slurred ? ?Speech Volume:Decreased ? ?Handedness:Right ? ? ?Mood and Affect  ?Mood:Euphoric ? ?Affect:Inappropriate; Full Range ? ? ?Thought Process  ?Thought Processes:Disorganized ? ?Descriptions of Associations:Tangential ? ?Orientation:Partial ? ?Thought Content:Obsessions; Paranoid Ideation; Illogical ? ?History of Schizophrenia/Schizoaffective disorder:No ? ?Duration of Psychotic Symptoms:Greater than six months ? ?Hallucinations:No data recorded ?Ideas of Reference:Delusions ? ?Suicidal Thoughts:No  data recorded ?Homicidal Thoughts:No data recorded ? ?Sensorium  ?Memory:Immediate Poor; Recent Poor; Remote Poor ? ?Judgment:Poor ? ?Insight:Poor ? ? ?Executive Functions  ?Concentration:Poor ? ?Attention Span:Poor ? ?Recall:Poor ? ?Fund of Knowledge:Poor ? ?Language:Poor ? ? ?Psychomotor Activity  ?Psychomotor Activity:No data recorded ? ?Assets  ?Assets:Communication Skills; Desire for Improvement; Resilience; Social Support ? ? ?Sleep  ?Sleep:No data recorded ? ? ?Physical Exam: ?Physical Exam ?  Vitals and nursing note reviewed.  ?Constitutional:   ?   Appearance: Normal appearance.  ?HENT:  ?   Head: Normocephalic and atraumatic.  ?   Mouth/Throat:  ?   Pharynx: Oropharynx is clear.  ?Eyes:  ?   Pupils: Pupils are equal, round, and reactive to light.  ?Cardiovascular:  ?   Rate and Rhythm: Normal rate and regular rhythm.  ?Pulmonary:  ?   Effort: Pulmonary effort is normal.  ?   Breath sounds: Normal breath sounds.  ?Abdominal:  ?   General: Abdomen is flat.  ?   Palpations: Abdomen is soft.  ?Musculoskeletal:     ?   General: Normal range of motion.  ?Skin: ?   General: Skin is warm and dry.  ?Neurological:  ?   General: No focal deficit present.  ?   Mental Status: She is alert. Mental status is at baseline.  ?Psychiatric:     ?   Mood and Affect: Mood normal.     ?   Thought Content: Thought content normal.  ? ?Review of Systems  ?Constitutional: Negative.   ?HENT: Negative.    ?Eyes: Negative.   ?Respiratory: Negative.    ?Cardiovascular: Negative.   ?Gastrointestinal: Negative.   ?Musculoskeletal: Negative.   ?Skin: Negative.   ?Neurological: Negative.   ?Psychiatric/Behavioral: Negative.    ?Blood pressure 94/65, pulse 61, temperature 97.6 ?F (36.4 ?C), temperature source Oral, resp. rate 17, height '5\' 1"'$  (1.549 m), weight 46.3 kg, SpO2 100 %. Body mass index is 19.27 kg/m?. ? ? ?Treatment Plan Summary: ?Medication management and Plan no change to medication.  It looks like we may be able to hope for a bed  at the shelter by Thursday. ? ?Alethia Berthold, MD ?02/02/2022, 3:23 PM ? ?

## 2022-02-02 NOTE — Plan of Care (Signed)
D- Patient alert and oriented. Patient presents in a pleasant mood on assessment stating that she slept good last night and had no complaints to voice to this Probation officer. Patient denies SI, HI, AVH, and pain at this time. Patient also denies any signs/symptoms of depression or anxiety, stating that she feels "pretty good about discharge". Patient reports that she may be leaving on Wednesday. Patient's goal for today is to "go to groups", in which she will "get out of bed", in order to do so. ? ?A- Scheduled medications administered to patient, per MD orders. Support and encouragement provided.  Routine safety checks conducted every 15 minutes.  Patient informed to notify staff with problems or concerns. ? ?R- No adverse drug reactions noted. Patient contracts for safety at this time. Patient compliant with medications and treatment plan. Patient receptive, calm, and cooperative. Patient interacts well with others on the unit.  Patient remains safe at this time. ? ?Problem: Education: ?Goal: Knowledge of [place] [facility] Education information/materials will improve ?Outcome: Progressing ?Goal: Emotional status will improve ?Outcome: Progressing ?Goal: Mental status will improve ?Outcome: Progressing ?Goal: Verbalization of understanding the information provided will improve ?Outcome: Progressing ?  ?Problem: Health Behavior/Discharge Planning: ?Goal: Identification of resources available to assist in meeting health care needs will improve ?Outcome: Progressing ?Goal: Compliance with treatment plan for underlying cause of condition will improve ?Outcome: Progressing ?  ?Problem: Education: ?Goal: Utilization of techniques to improve thought processes will improve ?Outcome: Progressing ?Goal: Knowledge of the prescribed therapeutic regimen will improve ?Outcome: Progressing ?  ?Problem: Activity: ?Goal: Interest or engagement in leisure activities will improve ?Outcome: Progressing ?Goal: Imbalance in normal sleep/wake  cycle will improve ?Outcome: Progressing ?  ?Problem: Coping: ?Goal: Coping ability will improve ?Outcome: Progressing ?Goal: Will verbalize feelings ?Outcome: Progressing ?  ?Problem: Role Relationship: ?Goal: Will demonstrate positive changes in social behaviors and relationships ?Outcome: Progressing ?  ?Problem: Safety: ?Goal: Ability to disclose and discuss suicidal ideas will improve ?Outcome: Progressing ?Goal: Ability to identify and utilize support systems that promote safety will improve ?Outcome: Progressing ?  ?Problem: Self-Concept: ?Goal: Will verbalize positive feelings about self ?Outcome: Progressing ?Goal: Level of anxiety will decrease ?Outcome: Progressing ?  ?

## 2022-02-02 NOTE — Progress Notes (Signed)
Pt calm and cooperative during assessment denying SI/HI/AVH. Pt stated she is just waiting for placement. Pt observed interacting appropriately with staff and peers on the unit. Pt compliant with medication administration per MD orders. Pt being monitored Q 15 minutes for safety per unit protocol. Pt remains safe on the unit.  ?

## 2022-02-02 NOTE — Progress Notes (Signed)
Recreation Therapy Notes ? ?Date: 02/02/2022 ? ?Time: 10:35am  ? ?Location: Courtyard   ? ?Behavioral response: N/A ?  ?Intervention Topic: Wellness  ? ?Discussion/Intervention: ?Patient refused to attend group.  ? ?Clinical Observations/Feedback:  ?Patient refused to attend group.  ?  ?Dama Hedgepeth LRT/CTRS ? ? ? ? ? ? ?Ronie Barnhart ?02/02/2022 11:59 AM ?

## 2022-02-03 DIAGNOSIS — F319 Bipolar disorder, unspecified: Secondary | ICD-10-CM | POA: Diagnosis not present

## 2022-02-03 NOTE — Progress Notes (Signed)
Recreation Therapy Notes ? ?Date: 02/03/2022 ? ?Time: 11:00am  ? ?Location: Craft room   ? ?Behavioral response: N/A ?  ?Intervention Topic: Coping Skills  ? ?Discussion/Intervention: ?Patient refused to attend group.  ? ?Clinical Observations/Feedback:  ?Patient refused to attend group.  ?  ?Lyndle Pang LRT/CTRS ? ? ? ? ? ? ? ?Khaleelah Yowell ?02/03/2022 12:08 PM ?

## 2022-02-03 NOTE — Plan of Care (Signed)
Patient rated her depression and anxiety 0/10. Pleasant and cooperative on approach. Patient stated that her goal is work on discharge and talk to Education officer, museum. Denies SI,HI and AVH. Appetite and energy level good. ADLs maintained. Attended groups. Support and encouragement given. ?

## 2022-02-03 NOTE — Group Note (Signed)
[place] LCSW Group Therapy Note ? ? ?Group Date: 02/03/2022 ?Start Time: 1300 ?End Time: 1400 ? ? ?Type of Therapy/Topic:  Group Therapy:  Emotion Regulation ? ?Participation Level:  Did Not Attend  ? ?Mood: ? ?Description of Group:   ? The purpose of this group is to assist patients in learning to regulate negative emotions and experience positive emotions. Patients will be guided to discuss ways in which they have been vulnerable to their negative emotions. These vulnerabilities will be juxtaposed with experiences of positive emotions or situations, and patients challenged to use positive emotions to combat negative ones. Special emphasis will be placed on coping with negative emotions in conflict situations, and patients will process healthy conflict resolution skills. ? ?Therapeutic Goals: ?Patient will identify two positive emotions or experiences to reflect on in order to balance out negative emotions:  ?Patient will label two or more emotions that they find the most difficult to experience:  ?Patient will be able to demonstrate positive conflict resolution skills through discussion or role plays:  ? ?Summary of Patient Progress: ? ? ?Patient did not attend group despite encouraged participation.  ? ? ? ?Therapeutic Modalities:   ?Cognitive Behavioral Therapy ?Feelings Identification ?Dialectical Behavioral Therapy ? ? ?Durenda Hurt, LCSWA ?

## 2022-02-03 NOTE — Progress Notes (Signed)
?   02/03/22 1100  ?Clinical Encounter Type  ?Visited With Patient  ?Visit Type Follow-up  ?Referral From Chaplain  ?Consult/Referral To Chaplain  ? ?Chaplain visited with patient. Chaplain supported patient while she expressed feelings about her stay and discharge planning. Patient appreciated [place] visit. ?

## 2022-02-03 NOTE — Progress Notes (Signed)
Pt calm and cooperative during assessment denying SI/HI/AVH. Pt stated she is just waiting for placement. Pt observed interacting appropriately with staff and peers on the unit. Pt compliant with medication administration per MD orders. Pt being monitored Q 15 minutes for safety per unit protocol. Pt remains safe on the unit.  ?

## 2022-02-03 NOTE — Progress Notes (Signed)
[facility] MD Progress Note ? ?02/03/2022 2:06 PM ?Autumn [name]  ?MRN:  [id number] ?Subjective: Follow-up 46 year old woman with history of bipolar disorder.  Currently stable.  No new complaints.  Not feeling depressed.  No evidence of psychosis or reports of psychotic symptoms.  No suicidal ideation.  No place to live we are hoping for placement by tomorrow. ?Principal Problem: Bipolar 1 disorder ([place]) ?Diagnosis: Principal Problem: ?  Bipolar 1 disorder ([place]) ?Active Problems: ?  Amphetamine use disorder, severe ([place]) ?  Opioid use disorder, severe, dependence ([place]) ?  Cannabis use disorder, severe, dependence ([place]) ?  Opiate abuse, continuous ([place]) ?  Cocaine abuse ([place]) ? ?Total Time spent with patient: 30 minutes ? ?Past Psychiatric History: Past history of longstanding chronic mental health and substance abuse problems ? ?Past Medical History:  ?Past Medical History:  ?Diagnosis Date  ? Allergy   ? Seasonal, Ultram (seizures)  ? Anemia 2005  ? Bipolar 1 disorder ([place])   ? Glaucoma   ? Seizures ([place]) 2008  ?  ?Past Surgical History:  ?Procedure Laterality Date  ? BREAST EXCISIONAL BIOPSY Right   ? x 2  ? CESAREAN SECTION    ? x 2  ? LEEP  2000  ? ?Family History:  ?Family History  ?Problem Relation Age of Onset  ? Hypertension Mother   ? Hyperlipidemia Mother   ? Asthma Mother   ? Parkinson's disease Father   ? Lung cancer Maternal Grandmother   ? Other Paternal Grandfather 60  ?     Cardiac arrest  ? Heart disease Paternal Grandfather   ? Tourette syndrome Daughter   ? ?Family Psychiatric  History: See previous ?Social History:  ?Social History  ? ?Substance and Sexual Activity  ?Alcohol Use Yes  ? Alcohol/week: 4.0 standard drinks  ? Types: 4 Cans of beer per week  ? Comment: Ocassional  ?   ?Social History  ? ?Substance and Sexual Activity  ?Drug Use Yes  ? Types: Marijuana  ? Comment: percocet -last use over 1 week ago. Heroin-last used 2/11  ?  ?Social History  ? ?Socioeconomic History  ? Marital status: Legally  Separated  ?  Spouse name: Not on file  ? Number of children: Not on file  ? Years of education: Not on file  ? Highest education level: Not on file  ?Occupational History  ? Not on file  ?Tobacco Use  ? Smoking status: Every Day  ?  Packs/day: 1.00  ?  Years: 9.00  ?  Pack years: 9.00  ?  Types: E-cigarettes, Cigarettes  ? Smokeless tobacco: Never  ?Vaping Use  ? Vaping Use: Never used  ?Substance and Sexual Activity  ? Alcohol use: Yes  ?  Alcohol/week: 4.0 standard drinks  ?  Types: 4 Cans of beer per week  ?  Comment: Ocassional  ? Drug use: Yes  ?  Types: Marijuana  ?  Comment: percocet -last use over 1 week ago. Heroin-last used 2/11  ? Sexual activity: Yes  ?  Birth control/protection: None  ?Other Topics Concern  ? Not on file  ?Social History Narrative  ? Not on file  ? ?Social Determinants of Health  ? ?Financial Resource Strain: Not on file  ?Food Insecurity: Not on file  ?Transportation Needs: Not on file  ?Physical Activity: Not on file  ?Stress: Not on file  ?Social Connections: Not on file  ? ?Additional Social History:  ?  ?  ?  ?  ?  ?  ?  ?  ?  ?  ?  ? ?  Sleep: Fair ? ?Appetite:  Fair ? ?Current Medications: ?Current Facility-Administered Medications  ?Medication Dose Route Frequency Provider Last Rate Last Admin  ? acetaminophen (TYLENOL) tablet 650 mg  650 mg Oral Q6H PRN Sherlon Handing, NP   650 mg at 02/01/22 1712  ? alum & mag hydroxide-simeth (MAALOX/MYLANTA) 200-200-20 MG/5ML suspension 30 mL  30 mL Oral Q4H PRN Sherlon Handing, NP      ? ARIPiprazole (ABILIFY) tablet 15 mg  15 mg Oral Daily Emryn Flanery, [name], MD   15 mg at 02/03/22 0801  ? divalproex (DEPAKOTE) DR tablet 750 mg  750 mg Oral Q12H Waldon Merl F, NP   750 mg at 02/03/22 0801  ? feeding supplement (ENSURE ENLIVE / ENSURE PLUS) liquid 237 mL  237 mL Oral BID BM Keylani Perlstein, [name], MD   237 mL at 02/03/22 1033  ? lithium carbonate (LITHOBID) CR tablet 600 mg  600 mg Oral Q12H Waldon Merl F, NP   600 mg at 02/03/22 0801   ? magnesium hydroxide (MILK OF MAGNESIA) suspension 30 mL  30 mL Oral Daily PRN Waldon Merl F, NP      ? metroNIDAZOLE (FLAGYL) tablet 500 mg  500 mg Oral Q12H Paliy, Alisa, MD   500 mg at 02/03/22 0801  ? nicotine (NICODERM CQ - dosed in mg/24 hours) patch 21 mg  21 mg Transdermal Daily Waldon Merl F, NP   21 mg at 02/03/22 0801  ? QUEtiapine (SEROQUEL) tablet 400 mg  400 mg Oral QHS Tiane Szydlowski, [name], MD   400 mg at 02/02/22 2107  ? ? ?Lab Results: No results found for this or any previous visit (from the past 48 hour(s)). ? ?Blood Alcohol level:  ?Lab Results  ?Component Value Date  ? ETH <10 01/19/2022  ? ETH <10 12/24/2021  ? ? ?Metabolic Disorder Labs: ?Lab Results  ?Component Value Date  ? HGBA1C 4.9 12/25/2021  ? MPG 93.93 12/25/2021  ? MPG 114 06/02/2021  ? ?No results found for: PROLACTIN ?Lab Results  ?Component Value Date  ? CHOL 160 12/25/2021  ? TRIG 95 12/25/2021  ? HDL 58 12/25/2021  ? CHOLHDL 2.8 12/25/2021  ? VLDL 19 12/25/2021  ? [place] 83 12/25/2021  ? [place] 63 06/01/2021  ? ? ?Physical Findings: ?AIMS:  , ,  ,  ,    ?CIWA:  CIWA-Ar Total: 2 ?COWS:    ? ?Musculoskeletal: ?Strength & Muscle Tone: within normal limits ?Gait & Station: normal ?Patient leans: N/A ? ?Psychiatric Specialty Exam: ? ?Presentation  ?General Appearance: Bizarre; Disheveled ? ?Eye Contact:Good ? ?Speech:Pressured; Slurred ? ?Speech Volume:Decreased ? ?Handedness:Right ? ? ?Mood and Affect  ?Mood:Euphoric ? ?Affect:Inappropriate; Full Range ? ? ?Thought Process  ?Thought Processes:Disorganized ? ?Descriptions of Associations:Tangential ? ?Orientation:Partial ? ?Thought Content:Obsessions; Paranoid Ideation; Illogical ? ?History of Schizophrenia/Schizoaffective disorder:No ? ?Duration of Psychotic Symptoms:Greater than six months ? ?Hallucinations:No data recorded ?Ideas of Reference:Delusions ? ?Suicidal Thoughts:No data recorded ?Homicidal Thoughts:No data recorded ? ?Sensorium  ?Memory:Immediate Poor; Recent  Poor; Remote Poor ? ?Judgment:Poor ? ?Insight:Poor ? ? ?Executive Functions  ?Concentration:Poor ? ?Attention Span:Poor ? ?Recall:Poor ? ?Fund of Knowledge:Poor ? ?Language:Poor ? ? ?Psychomotor Activity  ?Psychomotor Activity:No data recorded ? ?Assets  ?Assets:Communication Skills; Desire for Improvement; Resilience; Social Support ? ? ?Sleep  ?Sleep:No data recorded ? ? ?Physical Exam: ?Physical Exam ?Vitals and nursing note reviewed.  ?Constitutional:   ?   Appearance: Normal appearance.  ?HENT:  ?   Head: Normocephalic and atraumatic.  ?  Mouth/Throat:  ?   Pharynx: Oropharynx is clear.  ?Eyes:  ?   Pupils: Pupils are equal, round, and reactive to light.  ?Cardiovascular:  ?   Rate and Rhythm: Normal rate and regular rhythm.  ?Pulmonary:  ?   Effort: Pulmonary effort is normal.  ?   Breath sounds: Normal breath sounds.  ?Abdominal:  ?   General: Abdomen is flat.  ?   Palpations: Abdomen is soft.  ?Musculoskeletal:     ?   General: Normal range of motion.  ?Skin: ?   General: Skin is warm and dry.  ?Neurological:  ?   General: No focal deficit present.  ?   Mental Status: She is alert. Mental status is at baseline.  ?Psychiatric:     ?   Mood and Affect: Mood normal.     ?   Thought Content: Thought content normal.  ? ?Review of Systems  ?Constitutional: Negative.   ?HENT: Negative.    ?Eyes: Negative.   ?Respiratory: Negative.    ?Cardiovascular: Negative.   ?Gastrointestinal: Negative.   ?Musculoskeletal: Negative.   ?Skin: Negative.   ?Neurological: Negative.   ?Psychiatric/Behavioral: Negative.    ?All other systems reviewed and are negative. ?Blood pressure 90/65, pulse 60, temperature 97.9 ?F (36.6 ?C), temperature source Oral, resp. rate 17, height '5\' 1"'$  (1.549 m), weight 46.3 kg, SpO2 98 %. Body mass index is 19.27 kg/m?. ? ? ?Treatment Plan Summary: ?Medication management and Plan no change to medication management.  Blood pressure a little low today but patient is not symptomatic.  Encouraged her to  attend groups continue to interact with [place].  We were rechecking on the shelter status daily. ? ?Alethia Berthold, MD ?02/03/2022, 2:06 PM ? ?

## 2022-02-04 ENCOUNTER — Other Ambulatory Visit: Payer: Self-pay

## 2022-02-04 MED ORDER — LITHIUM CARBONATE ER 300 MG PO TBCR
600.0000 mg | EXTENDED_RELEASE_TABLET | Freq: Two times a day (BID) | ORAL | 0 refills | Status: DC
  Filled 2022-02-04: qty 37, 9d supply, fill #0

## 2022-02-04 MED ORDER — NICOTINE 21 MG/24HR TD PT24
21.0000 mg | MEDICATED_PATCH | Freq: Every day | TRANSDERMAL | 0 refills | Status: DC
  Filled 2022-02-04: qty 14, 14d supply, fill #0

## 2022-02-04 MED ORDER — DIVALPROEX SODIUM 250 MG PO DR TAB
750.0000 mg | DELAYED_RELEASE_TABLET | Freq: Two times a day (BID) | ORAL | 0 refills | Status: DC
  Filled 2022-02-04: qty 60, 10d supply, fill #0

## 2022-02-04 MED ORDER — ARIPIPRAZOLE 15 MG PO TABS
15.0000 mg | ORAL_TABLET | Freq: Every day | ORAL | 0 refills | Status: DC
  Filled 2022-02-04: qty 10, 10d supply, fill #0

## 2022-02-04 MED ORDER — QUETIAPINE FUMARATE 400 MG PO TABS
400.0000 mg | ORAL_TABLET | Freq: Every day | ORAL | 0 refills | Status: DC
Start: 2022-02-04 — End: 2022-02-05
  Filled 2022-02-04: qty 10, 10d supply, fill #0

## 2022-02-04 NOTE — Plan of Care (Signed)
?  Problem: Education: ?Goal: Knowledge of [facility] [place] [facility] Education information/materials will improve ?Outcome: Progressing ?Goal: Emotional status will improve ?Outcome: Progressing ?Goal: Mental status will improve ?Outcome: Progressing ?Goal: Verbalization of understanding the information provided will improve ?Outcome: Progressing ?  ?Problem: Health Behavior/Discharge Planning: ?Goal: Compliance with treatment plan for underlying cause of condition will improve ?Outcome: Progressing ?  ?Problem: Education: ?Goal: Knowledge of the prescribed therapeutic regimen will improve ?Outcome: Progressing ?  ?Problem: Self-Concept: ?Goal: Level of anxiety will decrease ?Outcome: Progressing ?  ?

## 2022-02-04 NOTE — Progress Notes (Signed)
Patient denies pain. Patient denies anxiety and depression. Patient denies SI/HI/AVH. Patient compliant with medication administration. Patient isolative to room during shift with the exception to eating meals.  ?Q15 minute safety checks maintained. Patient remains safe on the unit at this time.  ?

## 2022-02-04 NOTE — Progress Notes (Addendum)
Recreation Therapy Notes ? ? ?Date: 02/04/2022 ? ?Time: 10:50 am  ? ?Location: Courtyard   ? ?Behavioral response: N/A ?  ?Intervention Topic: Relaxation   ? ?Discussion/Intervention: ?Patient refused to attend group.  ? ?Clinical Observations/Feedback:  ?Patient refused to attend group.  ?  ?Shavaun Osterloh LRT/CTRS ? ? ? ? ? ? ? ?Lashone Stauber ?02/04/2022 12:18 PM ?

## 2022-02-04 NOTE — Group Note (Addendum)
LCSW Group Therapy Note ?  ?02/04/2022 4:03 PM ? ?Type of Therapy and Topic:  Group Therapy:  Overcoming Obstacles ?  ?Participation Level:  Did Not Attend ?  ?Description of Group:   ? In this group patients will be encouraged to explore what they see as obstacles to their own wellness and recovery. They will be guided to discuss their thoughts, feelings, and behaviors related to these obstacles. The group will process together ways to cope with barriers, with attention given to specific choices patients can make. Each patient will be challenged to identify changes they are motivated to make in order to overcome their obstacles. This group will be process-oriented, with patients participating in exploration of their own experiences as well as giving and receiving support and challenge from other group members. ?  ?Therapeutic Goals: ?Patient will identify personal and current obstacles as they relate to admission. ?Patient will identify barriers that currently interfere with their wellness or overcoming obstacles.  ?Patient will identify feelings, thought process and behaviors related to these barriers. ?Patient will identify two changes they are willing to make to overcome these obstacles:  ?  ?  ?Summary of Patient Progress ? ?  ?X ?  ?Therapeutic Modalities:   ?Cognitive Behavioral Therapy ?Solution Focused Therapy ?Motivational Interviewing ?Relapse Prevention Therapy ? ?  ? ?Rozann Lesches, LCSWA ?02/04/2022  4:02 PM   ? ?

## 2022-02-04 NOTE — BHH Counselor (Signed)
CSW followed up with [employer].  CSW was informed that bed is likely to be available on 02/05/2022 "first thing in the morning".  CSW is attempting to clarify a more specific time.  ? ?CSW has made treatment team aware.  ? ?Assunta Curtis, MSW, LCSW ?02/04/2022 12:56 PM  ?

## 2022-02-04 NOTE — Progress Notes (Signed)
Pt calm and cooperative during assessment denying SI/HI/AVH. Pt stated she is just waiting for placement. Pt observed interacting appropriately with staff and peers on the unit. Pt compliant with medication administration per MD orders. Pt being monitored Q 15 minutes for safety per unit protocol. Pt remains safe on the unit.  ?

## 2022-02-04 NOTE — Progress Notes (Signed)
[facility] MD Progress Note ? ?02/04/2022 3:09 PM ?Autumn [name]  ?MRN:  [id number] ?Subjective: Doing well.  No new complaints.  Mood stable ?Principal Problem: Bipolar 1 disorder ([place]) ?Diagnosis: Principal Problem: ?  Bipolar 1 disorder ([place]) ?Active Problems: ?  Amphetamine use disorder, severe ([place]) ?  Opioid use disorder, severe, dependence ([place]) ?  Cannabis use disorder, severe, dependence ([place]) ?  Opiate abuse, continuous ([place]) ?  Cocaine abuse ([place]) ? ?Total Time spent with patient: 30 minutes ? ?Past Psychiatric History: Past history as noted previously multiple episodes of mood and substance abuse issues with hospitalization ? ?Past Medical History:  ?Past Medical History:  ?Diagnosis Date  ? Allergy   ? Seasonal, Ultram (seizures)  ? Anemia 2005  ? Bipolar 1 disorder ([place])   ? Glaucoma   ? Seizures ([place]) 2008  ?  ?Past Surgical History:  ?Procedure Laterality Date  ? BREAST EXCISIONAL BIOPSY Right   ? x 2  ? CESAREAN SECTION    ? x 2  ? LEEP  2000  ? ?Family History:  ?Family History  ?Problem Relation Age of Onset  ? Hypertension Mother   ? Hyperlipidemia Mother   ? Asthma Mother   ? Parkinson's disease Father   ? Lung cancer Maternal Grandmother   ? Other Paternal Grandfather 55  ?     Cardiac arrest  ? Heart disease Paternal Grandfather   ? Tourette syndrome Daughter   ? ?Family Psychiatric  History: See previous ?Social History:  ?Social History  ? ?Substance and Sexual Activity  ?Alcohol Use Yes  ? Alcohol/week: 4.0 standard drinks  ? Types: 4 Cans of beer per week  ? Comment: Ocassional  ?   ?Social History  ? ?Substance and Sexual Activity  ?Drug Use Yes  ? Types: Marijuana  ? Comment: percocet -last use over 1 week ago. Heroin-last used 2/11  ?  ?Social History  ? ?Socioeconomic History  ? Marital status: Legally Separated  ?  Spouse name: Not on file  ? Number of children: Not on file  ? Years of education: Not on file  ? Highest education level: Not on file  ?Occupational History  ? Not on file   ?Tobacco Use  ? Smoking status: Every Day  ?  Packs/day: 1.00  ?  Years: 9.00  ?  Pack years: 9.00  ?  Types: E-cigarettes, Cigarettes  ? Smokeless tobacco: Never  ?Vaping Use  ? Vaping Use: Never used  ?Substance and Sexual Activity  ? Alcohol use: Yes  ?  Alcohol/week: 4.0 standard drinks  ?  Types: 4 Cans of beer per week  ?  Comment: Ocassional  ? Drug use: Yes  ?  Types: Marijuana  ?  Comment: percocet -last use over 1 week ago. Heroin-last used 2/11  ? Sexual activity: Yes  ?  Birth control/protection: None  ?Other Topics Concern  ? Not on file  ?Social History Narrative  ? Not on file  ? ?Social Determinants of Health  ? ?Financial Resource Strain: Not on file  ?Food Insecurity: Not on file  ?Transportation Needs: Not on file  ?Physical Activity: Not on file  ?Stress: Not on file  ?Social Connections: Not on file  ? ?Additional Social History:  ?  ?  ?  ?  ?  ?  ?  ?  ?  ?  ?  ? ?Sleep: Fair ? ?Appetite:  Fair ? ?Current Medications: ?Current Facility-Administered Medications  ?Medication Dose Route Frequency Provider Last Rate Last  Admin  ? acetaminophen (TYLENOL) tablet 650 mg  650 mg Oral Q6H PRN Sherlon Handing, NP   650 mg at 02/01/22 1712  ? alum & mag hydroxide-simeth (MAALOX/MYLANTA) 200-200-20 MG/5ML suspension 30 mL  30 mL Oral Q4H PRN Sherlon Handing, NP      ? ARIPiprazole (ABILIFY) tablet 15 mg  15 mg Oral Daily Cidney Kirkwood, [name], MD   15 mg at 02/04/22 0756  ? divalproex (DEPAKOTE) DR tablet 750 mg  750 mg Oral Q12H Waldon Merl F, NP   750 mg at 02/04/22 0757  ? feeding supplement (ENSURE ENLIVE / ENSURE PLUS) liquid 237 mL  237 mL Oral BID BM Novali Vollman T, MD   237 mL at 02/04/22 1344  ? lithium carbonate (LITHOBID) CR tablet 600 mg  600 mg Oral Q12H Waldon Merl F, NP   600 mg at 02/04/22 2703  ? magnesium hydroxide (MILK OF MAGNESIA) suspension 30 mL  30 mL Oral Daily PRN Waldon Merl F, NP      ? metroNIDAZOLE (FLAGYL) tablet 500 mg  500 mg Oral Q12H Paliy, Delrae Rend, MD    500 mg at 02/04/22 0757  ? nicotine (NICODERM CQ - dosed in mg/24 hours) patch 21 mg  21 mg Transdermal Daily Waldon Merl F, NP   21 mg at 02/04/22 0758  ? QUEtiapine (SEROQUEL) tablet 400 mg  400 mg Oral QHS Sherrie Marsan, [name], MD   400 mg at 02/03/22 2123  ? ? ?Lab Results: No results found for this or any previous visit (from the past 48 hour(s)). ? ?Blood Alcohol level:  ?Lab Results  ?Component Value Date  ? ETH <10 01/19/2022  ? ETH <10 12/24/2021  ? ? ?Metabolic Disorder Labs: ?Lab Results  ?Component Value Date  ? HGBA1C 4.9 12/25/2021  ? MPG 93.93 12/25/2021  ? MPG 114 06/02/2021  ? ?No results found for: PROLACTIN ?Lab Results  ?Component Value Date  ? CHOL 160 12/25/2021  ? TRIG 95 12/25/2021  ? HDL 58 12/25/2021  ? CHOLHDL 2.8 12/25/2021  ? VLDL 19 12/25/2021  ? [place] 83 12/25/2021  ? [place] 63 06/01/2021  ? ? ?Physical Findings: ?AIMS:  , ,  ,  ,    ?CIWA:  CIWA-Ar Total: 2 ?COWS:    ? ?Musculoskeletal: ?Strength & Muscle Tone: within normal limits ?Gait & Station: normal ?Patient leans: N/A ? ?Psychiatric Specialty Exam: ? ?Presentation  ?General Appearance: Bizarre; Disheveled ? ?Eye Contact:Good ? ?Speech:Pressured; Slurred ? ?Speech Volume:Decreased ? ?Handedness:Right ? ? ?Mood and Affect  ?Mood:Euphoric ? ?Affect:Inappropriate; Full Range ? ? ?Thought Process  ?Thought Processes:Disorganized ? ?Descriptions of Associations:Tangential ? ?Orientation:Partial ? ?Thought Content:Obsessions; Paranoid Ideation; Illogical ? ?History of Schizophrenia/Schizoaffective disorder:No ? ?Duration of Psychotic Symptoms:Greater than six months ? ?Hallucinations:No data recorded ?Ideas of Reference:Delusions ? ?Suicidal Thoughts:No data recorded ?Homicidal Thoughts:No data recorded ? ?Sensorium  ?Memory:Immediate Poor; Recent Poor; Remote Poor ? ?Judgment:Poor ? ?Insight:Poor ? ? ?Executive Functions  ?Concentration:Poor ? ?Attention Span:Poor ? ?Recall:Poor ? ?Fund of  Knowledge:Poor ? ?Language:Poor ? ? ?Psychomotor Activity  ?Psychomotor Activity:No data recorded ? ?Assets  ?Assets:Communication Skills; Desire for Improvement; Resilience; Social Support ? ? ?Sleep  ?Sleep:No data recorded ? ? ?Physical Exam: ?Physical Exam ?Vitals and nursing note reviewed.  ?Constitutional:   ?   Appearance: Normal appearance.  ?HENT:  ?   Head: Normocephalic and atraumatic.  ?   Mouth/Throat:  ?   Pharynx: Oropharynx is clear.  ?Eyes:  ?   Pupils: Pupils are equal,  round, and reactive to light.  ?Cardiovascular:  ?   Rate and Rhythm: Normal rate and regular rhythm.  ?Pulmonary:  ?   Effort: Pulmonary effort is normal.  ?   Breath sounds: Normal breath sounds.  ?Abdominal:  ?   General: Abdomen is flat.  ?   Palpations: Abdomen is soft.  ?Musculoskeletal:     ?   General: Normal range of motion.  ?Skin: ?   General: Skin is warm and dry.  ?Neurological:  ?   General: No focal deficit present.  ?   Mental Status: She is alert. Mental status is at baseline.  ?Psychiatric:     ?   Attention and Perception: Attention normal.     ?   Mood and Affect: Mood normal.     ?   Speech: Speech normal.     ?   Behavior: Behavior normal.     ?   Thought Content: Thought content normal.     ?   Cognition and Memory: Cognition normal.  ? ?ROS ?Blood pressure 93/65, pulse 73, temperature 98.5 ?F (36.9 ?C), temperature source Oral, resp. rate 17, height '5\' 1"'$  (1.549 m), weight 46.3 kg, SpO2 97 %. Body mass index is 19.27 kg/m?. ? ? ?Treatment Plan Summary: ?Plan no change to current medication or treatment plan.  Hoping for a bed to be available at the shelter tomorrow ? ?Alethia Berthold, MD ?02/04/2022, 3:09 PM ? ?

## 2022-02-05 ENCOUNTER — Other Ambulatory Visit: Payer: Self-pay

## 2022-02-05 MED ORDER — LITHIUM CARBONATE ER 300 MG PO TBCR: 600.0000 mg | EXTENDED_RELEASE_TABLET | Freq: Two times a day (BID) | ORAL | 1 refills | Status: DC

## 2022-02-05 MED ORDER — NICOTINE 21 MG/24HR TD PT24: 21.0000 mg | MEDICATED_PATCH | Freq: Every day | TRANSDERMAL | 1 refills | Status: DC

## 2022-02-05 MED ORDER — QUETIAPINE FUMARATE 400 MG PO TABS: 400.0000 mg | ORAL_TABLET | Freq: Every day | ORAL | 1 refills | Status: DC

## 2022-02-05 MED ORDER — DIVALPROEX SODIUM 250 MG PO DR TAB: 750.0000 mg | DELAYED_RELEASE_TABLET | Freq: Two times a day (BID) | ORAL | 1 refills | Status: DC

## 2022-02-05 MED ORDER — ARIPIPRAZOLE 15 MG PO TABS: 15.0000 mg | ORAL_TABLET | Freq: Every day | ORAL | 1 refills | Status: DC

## 2022-02-05 NOTE — Progress Notes (Signed)
Recreation Therapy Notes ? ?INPATIENT RECREATION TR PLAN ? ?Patient Details ?Name: Autumn [name] ?MRN: [id number] ?DOB: 03-Jul-1976 ?Today's Date: 02/05/2022 ? ?Rec Therapy Plan ?Is patient appropriate for Therapeutic Recreation?: Yes ?Treatment times per week: at least 3 ?Estimated Length of Stay: 5-7 days ?TR Treatment/Interventions: Group participation (Comment) ? ?Discharge Criteria ?Pt will be discharged from therapy if:: Discharged ?Treatment plan/goals/alternatives discussed and agreed upon by:: Patient/family ? ?Discharge Summary ?Short term goals set: Patient will engage in groups without prompting or encouragement from LRT x3 group sessions within 5 recreation therapy group sessions ?Short term goals met: Not met ?Reason goals not met: Patient spent most of her time in her room ?Therapeutic equipment acquired: N/A ?Reason patient discharged from therapy: Discharge from hospital ?Pt/family agrees with progress & goals achieved: Yes ?Date patient discharged from therapy: 02/05/22 ? ? ?Arlan Birks ?02/05/2022, 3:43 PM ?

## 2022-02-05 NOTE — Discharge Summary (Signed)
Physician Discharge Summary Note  Patient:  Autumn [name] is an 46 y.o., female MRN:  [id number] DOB:  1976-10-15 Patient phone:  [phone] (home)  Patient address:   [address] [place] [address],  Total Time spent with patient: 30 minutes  Date of Admission:  01/20/2022 Date of Discharge: 02/05/2022  Reason for Admission: Admitted after presenting through the emergency room with return of bizarre agitated psychotic behaviors and substance abuse and poor self-care  Principal Problem: Bipolar 1 disorder [facility]) Discharge Diagnoses: Principal Problem:   Bipolar 1 disorder (HCC) Active Problems:   Amphetamine use disorder, severe (HCC)   Opioid use disorder, severe, dependence (HCC)   Cannabis use disorder, severe, dependence (HCC)   Opiate abuse, continuous (HCC)   Cocaine abuse (HCC)   Past Psychiatric History: Past history of recurrent psychotic disorder with diagnosis of bipolar versus schizoaffective disorder.  Chronic severe substance abuse and failure to follow up with appropriate outpatient treatment.  Multiple hospitalizations.  Past Medical History:  Past Medical History:  Diagnosis Date   Allergy    Seasonal, Ultram (seizures)   Anemia 2005   Bipolar 1 disorder (HCC)    Glaucoma    Seizures (HCC) 2008    Past Surgical History:  Procedure Laterality Date   BREAST EXCISIONAL BIOPSY Right    x 2   CESAREAN SECTION     x 2   LEEP  2000   Family History:  Family History  Problem Relation Age of Onset   Hypertension Mother    Hyperlipidemia Mother    Asthma Mother    Parkinson's disease Father    Lung cancer Maternal Grandmother    Other Paternal Grandfather 54       Cardiac arrest   Heart disease Paternal Grandfather    Tourette syndrome Daughter    Family Psychiatric  History: Family history of anxiety and depression Social History:  Social History   Substance and Sexual Activity  Alcohol Use Yes   Alcohol/week: 4.0 standard drinks    Types: 4 Cans of beer per week   Comment: Ocassional     Social History   Substance and Sexual Activity  Drug Use Yes   Types: Marijuana   Comment: percocet -last use over 1 week ago. Heroin-last used 2/11    Social History   Socioeconomic History   Marital status: Legally Separated    Spouse name: Not on file   Number of children: Not on file   Years of education: Not on file   Highest education level: Not on file  Occupational History   Not on file  Tobacco Use   Smoking status: Every Day    Packs/day: 1.00    Years: 9.00    Pack years: 9.00    Types: E-cigarettes, Cigarettes   Smokeless tobacco: Never  Vaping Use   Vaping Use: Never used  Substance and Sexual Activity   Alcohol use: Yes    Alcohol/week: 4.0 standard drinks    Types: 4 Cans of beer per week    Comment: Ocassional   Drug use: Yes    Types: Marijuana    Comment: percocet -last use over 1 week ago. Heroin-last used 2/11   Sexual activity: Yes    Birth control/protection: None  Other Topics Concern   Not on file  Social History Narrative   Not on file   Social Determinants of Health   Financial Resource Strain: Not on file  Food Insecurity: Not on file  Transportation Needs: Not on  file  Physical Activity: Not on file  Stress: Not on file  Social Connections: Not on file    Hospital Course: Patient engaged in individual and group therapy appropriately.  Patient was kept on 15-minute checks.  Did not display any dangerous aggressive or suicidal behavior.  She was cooperative with restarting medication.  Patient seemed to recover to her baseline mental state fairly quickly.  Did not display significant psychosis on the unit and showed good insight.  She was homeless and at high risk of relapse both in terms of mental health and substance abuse problems.  We had to wait to find a bed available at the local homeless shelter which was her request as a discharge plan.  At the time of discharge she has  been showing lucid calm behavior with no acute dangerousness and has been medicine compliant.  She is agreeable to follow up at our HA.  She has had this many times in the past but we are hoping she will really do at this time.  Physical Findings: AIMS:  , ,  ,  ,    CIWA:  CIWA-Ar Total: 2 COWS:     Musculoskeletal: Strength & Muscle Tone: within normal limits Gait & Station: normal Patient leans: N/A   Psychiatric Specialty Exam:  Presentation  General Appearance: Bizarre; Disheveled  Eye Contact:Good  Speech:Pressured; Slurred  Speech Volume:Decreased  Handedness:Right   Mood and Affect  Mood:Euphoric  Affect:Inappropriate; Full Range   Thought Process  Thought Processes:Disorganized  Descriptions of Associations:Tangential  Orientation:Partial  Thought Content:Obsessions; Paranoid Ideation; Illogical  History of Schizophrenia/Schizoaffective disorder:No  Duration of Psychotic Symptoms:Greater than six months  Hallucinations:No data recorded Ideas of Reference:Delusions  Suicidal Thoughts:No data recorded Homicidal Thoughts:No data recorded  Sensorium  Memory:Immediate Poor; Recent Poor; Remote Poor  Judgment:Poor  Insight:Poor   Executive Functions  Concentration:Poor  Attention Span:Poor  Recall:Poor  Fund of Knowledge:Poor  Language:Poor   Psychomotor Activity  Psychomotor Activity:No data recorded  Assets  Assets:Communication Skills; Desire for Improvement; Resilience; Social Support   Sleep  Sleep:No data recorded   Physical Exam: Physical Exam Vitals and nursing note reviewed.  Constitutional:      Appearance: Normal appearance.  HENT:     Head: Normocephalic and atraumatic.     Mouth/Throat:     Pharynx: Oropharynx is clear.  Eyes:     Pupils: Pupils are equal, round, and reactive to light.  Cardiovascular:     Rate and Rhythm: Normal rate and regular rhythm.  Pulmonary:     Effort: Pulmonary effort is normal.      Breath sounds: Normal breath sounds.  Abdominal:     General: Abdomen is flat.     Palpations: Abdomen is soft.  Musculoskeletal:        General: Normal range of motion.  Skin:    General: Skin is warm and dry.  Neurological:     General: No focal deficit present.     Mental Status: She is alert. Mental status is at baseline.  Psychiatric:        Mood and Affect: Mood normal.        Thought Content: Thought content normal.   Review of Systems  Constitutional: Negative.   HENT: Negative.    Eyes: Negative.   Respiratory: Negative.    Cardiovascular: Negative.   Gastrointestinal: Negative.   Musculoskeletal: Negative.   Skin: Negative.   Neurological: Negative.   Psychiatric/Behavioral: Negative.    Blood pressure 100/72, pulse 77, temperature 97.9 F (  36.6 C), temperature source Oral, resp. rate 17, height 5\' 1"  (1.549 m), weight 46.3 kg, SpO2 98 %. Body mass index is 19.27 kg/m.   Social History   Tobacco Use  Smoking Status Every Day   Packs/day: 1.00   Years: 9.00   Pack years: 9.00   Types: E-cigarettes, Cigarettes  Smokeless Tobacco Never   Tobacco Cessation:  A prescription for an FDA-approved tobacco cessation medication provided at discharge   Blood Alcohol level:  Lab Results  Component Value Date   ETH <10 01/19/2022   ETH <10 12/24/2021    Metabolic Disorder Labs:  Lab Results  Component Value Date   HGBA1C 4.9 12/25/2021   MPG 93.93 12/25/2021   MPG 114 06/02/2021   No results found for: PROLACTIN Lab Results  Component Value Date   CHOL 160 12/25/2021   TRIG 95 12/25/2021   HDL 58 12/25/2021   CHOLHDL 2.8 12/25/2021   VLDL 19 12/25/2021   LDLCALC 83 12/25/2021   LDLCALC 63 06/01/2021    See Psychiatric Specialty Exam and Suicide Risk Assessment completed by Attending Physician prior to discharge.  Discharge destination:  Home  Is patient on multiple antipsychotic therapies at discharge:  No   Has Patient had three or more  failed trials of antipsychotic monotherapy by history:  No  Recommended Plan for Multiple Antipsychotic Therapies: NA  Discharge Instructions     Diet - low sodium heart healthy   Complete by: As directed    Increase activity slowly   Complete by: As directed       Allergies as of 02/05/2022       Reactions   Haldol [haloperidol Lactate]    Patient states that she gets stiff muscles when taking Haldol   Ultram [tramadol] Other (See Comments)   Seizures   Ziprasidone Hcl    Other reaction(s): Other (See Comments) PER PT WHEN SHE TAKES THIS SHE CANT MOVE HER NECK        Medication List     STOP taking these medications    hydrOXYzine 50 MG tablet Commonly known as: ATARAX       TAKE these medications      Indication  ARIPiprazole 15 MG tablet Commonly known as: ABILIFY Take 1 tablet (15 mg total) by mouth daily. What changed:  medication strength how much to take  Indication: MIXED BIPOLAR AFFECTIVE DISORDER   divalproex 250 MG DR tablet Commonly known as: DEPAKOTE Take 3 tablets (750 mg total) by mouth every 12 (twelve) hours.  Indication: Manic Phase of Manic-Depression   lithium carbonate 300 MG CR tablet Commonly known as: LITHOBID Take 2 tablets (600 mg total) by mouth every 12 (twelve) hours.  Indication: Schizoaffective Disorder   nicotine 21 mg/24hr patch Commonly known as: NICODERM CQ - dosed in mg/24 hours Place 1 patch (21 mg total) onto the skin daily. What changed: when to take this  Indication: Nicotine Addiction   QUEtiapine 400 MG tablet Commonly known as: SEROQUEL Take 1 tablet (400 mg total) by mouth at bedtime. What changed:  medication strength how much to take  Indication: Depressive Phase of Manic-Depression, Manic Phase of Manic-Depression        Follow-up Information     [facility] Follow up.   Why: Appointment is scheduled for 7:15AM  02/20/2022 with Lorella Nimrod, Peer Support Specialist. Contact  information: [address] [place] [address] [phone]  Follow-up recommendations: Encourage not only medicine compliance and avoidance of drug abuse but follow-up as already agreed upon with our HA starting on Monday morning.  Prescriptions and 10-day supply provided  Comments: Prescriptions and medication prescribed it at discharge  Signed: Mordecai Rasmussen, MD 02/05/2022, 11:11 AM

## 2022-02-05 NOTE — Plan of Care (Signed)
?  Problem: Education: ?Goal: Knowledge of [facility] Education information/materials will improve ?Outcome: Progressing ?Goal: Emotional status will improve ?Outcome: Progressing ?Goal: Mental status will improve ?Outcome: Progressing ?Goal: Verbalization of understanding the information provided will improve ?Outcome: Progressing ?  ?Problem: Health Behavior/Discharge Planning: ?Goal: Identification of resources available to assist in meeting health care needs will improve ?Outcome: Progressing ?  ?Problem: Education: ?Goal: Utilization of techniques to improve thought processes will improve ?Outcome: Progressing ?Goal: Knowledge of the prescribed therapeutic regimen will improve ?Outcome: Progressing ?  ?Problem: Self-Concept: ?Goal: Level of anxiety will decrease ?Outcome: Progressing ?  ?

## 2022-02-05 NOTE — Plan of Care (Signed)
?  Problem: Education: ?Goal: Knowledge of [facility] Education information/materials will improve ?02/05/2022 0908 by Donette Larry, RN ?Outcome: Adequate for Discharge ?02/05/2022 0859 by Donette Larry, RN ?Outcome: Progressing ?Goal: Emotional status will improve ?02/05/2022 0908 by Donette Larry, RN ?Outcome: Adequate for Discharge ?02/05/2022 0859 by Donette Larry, RN ?Outcome: Progressing ?Goal: Mental status will improve ?02/05/2022 0908 by Donette Larry, RN ?Outcome: Adequate for Discharge ?02/05/2022 0859 by Donette Larry, RN ?Outcome: Progressing ?Goal: Verbalization of understanding the information provided will improve ?02/05/2022 0908 by Donette Larry, RN ?Outcome: Adequate for Discharge ?02/05/2022 0859 by Donette Larry, RN ?Outcome: Progressing ?  ?Problem: Health Behavior/Discharge Planning: ?Goal: Identification of resources available to assist in meeting health care needs will improve ?02/05/2022 0908 by Donette Larry, RN ?Outcome: Adequate for Discharge ?02/05/2022 0859 by Donette Larry, RN ?Outcome: Progressing ?Goal: Compliance with treatment plan for underlying cause of condition will improve ?Outcome: Adequate for Discharge ?  ?Problem: Education: ?Goal: Utilization of techniques to improve thought processes will improve ?02/05/2022 0908 by Donette Larry, RN ?Outcome: Adequate for Discharge ?02/05/2022 0859 by Donette Larry, RN ?Outcome: Progressing ?Goal: Knowledge of the prescribed therapeutic regimen will improve ?02/05/2022 0908 by Donette Larry, RN ?Outcome: Adequate for Discharge ?02/05/2022 0859 by Donette Larry, RN ?Outcome: Progressing ?  ?Problem: Activity: ?Goal: Interest or engagement in leisure activities will improve ?Outcome: Adequate for Discharge ?Goal: Imbalance in normal sleep/wake cycle will improve ?Outcome: Adequate for Discharge ?  ?Problem: Coping: ?Goal: Coping ability will improve ?Outcome: Adequate for  Discharge ?Goal: Will verbalize feelings ?Outcome: Adequate for Discharge ?  ?Problem: Role Relationship: ?Goal: Will demonstrate positive changes in social behaviors and relationships ?Outcome: Adequate for Discharge ?  ?Problem: Safety: ?Goal: Ability to disclose and discuss suicidal ideas will improve ?Outcome: Adequate for Discharge ?Goal: Ability to identify and utilize support systems that promote safety will improve ?Outcome: Adequate for Discharge ?  ?Problem: Self-Concept: ?Goal: Will verbalize positive feelings about self ?Outcome: Adequate for Discharge ?Goal: Level of anxiety will decrease ?02/05/2022 0908 by Donette Larry, RN ?Outcome: Adequate for Discharge ?02/05/2022 0859 by Donette Larry, RN ?Outcome: Progressing ?  ?Problem: Group Participation ?Goal: STG - Patient will engage in groups without prompting or encouragement from LRT x3 group sessions within 5 recreation therapy group sessions ?Description: STG - Patient will engage in groups without prompting or encouragement from LRT x3 group sessions within 5 recreation therapy group sessions ?Outcome: Adequate for Discharge ?  ?

## 2022-02-05 NOTE — Progress Notes (Signed)
?  [facility] Adult Case Management Discharge Plan : ? ?Will you be returning to the same living situation after discharge:  No. Patient will be discharged to [facility] ?At discharge, do you have transportation home?: Yes,  CSW to assist with transportation needs.  ?Do you have the ability to pay for your medications: No. ? ?Release of information consent forms completed and in the chart;  [name] ? ?Patient to Follow up at: ? Follow-up Information   ? ? [facility] Follow up.   ?Why: Appointment is scheduled for 7:15AM  02/20/2022 with Lanae Boast, Peer Support Specialist. ?Contact information: ?[address] ?[place] [address] ?[phone] ? ? ?  ?  ? ?  ?  ? ?  ? ? ?Next level of care provider has access to [facility] ? ?Safety Planning and Suicide Prevention discussed: Yes,  SPE completed with thepatient. Attempts were made to contact her mother.  ? ?  ? ?Has patient been referred to the Quitline?: Patient refused referral Patient declined a referral to Quitline.  Stating that she was not yet ready to quit smoking cigarettes.  ? ?Patient has been referred for addiction treatment: Pt. refused referral ? ?Rozann Lesches, LCSW ?02/05/2022, 8:07 AM ?

## 2022-02-05 NOTE — Progress Notes (Signed)
Patient ID: Autumn [name], female   DOB: [birth year], 46 y.o.   MRN: [id number] ?Patient denies SI/HI/AVH. Belongings were returned to patient. Discharge instructions  including medication and follow up information were reviewed with patient and understanding was verbalized. Patient was not observed to be in distress at time of discharge. Patient was escorted out with staff to medical mall to be transported by [facility] to [facility] [place].  ?

## 2022-02-05 NOTE — [facility] Suicide Risk Assessment (Signed)
[facility] Discharge Suicide Risk Assessment ? ? ?Principal Problem: Bipolar 1 disorder ([place]) ?Discharge Diagnoses: Principal Problem: ?  Bipolar 1 disorder ([place]) ?Active Problems: ?  Amphetamine use disorder, severe ([place]) ?  Opioid use disorder, severe, dependence ([place]) ?  Cannabis use disorder, severe, dependence ([place]) ?  Opiate abuse, continuous ([place]) ?  Cocaine abuse ([place]) ? ? ?Total Time spent with patient: 30 minutes ? ?Musculoskeletal: ?Strength & Muscle Tone: within normal limits ?Gait & Station: normal ?Patient leans: N/A ? ?Psychiatric Specialty Exam ? ?Presentation  ?General Appearance: Bizarre; Disheveled ? ?Eye Contact:Good ? ?Speech:Pressured; Slurred ? ?Speech Volume:Decreased ? ?Handedness:Right ? ? ?Mood and Affect  ?Mood:Euphoric ? ?Duration of Depression Symptoms: Greater than two weeks ? ?Affect:Inappropriate; Full Range ? ? ?Thought Process  ?Thought Processes:Disorganized ? ?Descriptions of Associations:Tangential ? ?Orientation:Partial ? ?Thought Content:Obsessions; Paranoid Ideation; Illogical ? ?History of Schizophrenia/Schizoaffective disorder:No ? ?Duration of Psychotic Symptoms:Greater than six months ? ?Hallucinations:No data recorded ?Ideas of Reference:Delusions ? ?Suicidal Thoughts:No data recorded ?Homicidal Thoughts:No data recorded ? ?Sensorium  ?Memory:Immediate Poor; Recent Poor; Remote Poor ? ?Judgment:Poor ? ?Insight:Poor ? ? ?Executive Functions  ?Concentration:Poor ? ?Attention Span:Poor ? ?Recall:Poor ? ?Fund of Knowledge:Poor ? ?Language:Poor ? ? ?Psychomotor Activity  ?Psychomotor Activity:No data recorded ? ?Assets  ?Assets:Communication Skills; Desire for Improvement; Resilience; Social Support ? ? ?Sleep  ?Sleep:No data recorded ? ?Physical Exam: ?Physical Exam ?Vitals and nursing note reviewed.  ?Constitutional:   ?   Appearance: Normal appearance.  ?HENT:  ?   Head: Normocephalic and atraumatic.  ?   Mouth/Throat:  ?   Pharynx: Oropharynx is clear.  ?Eyes:  ?   Pupils:  Pupils are equal, round, and reactive to light.  ?Cardiovascular:  ?   Rate and Rhythm: Normal rate and regular rhythm.  ?Pulmonary:  ?   Effort: Pulmonary effort is normal.  ?   Breath sounds: Normal breath sounds.  ?Abdominal:  ?   General: Abdomen is flat.  ?   Palpations: Abdomen is soft.  ?Musculoskeletal:     ?   General: Normal range of motion.  ?Skin: ?   General: Skin is warm and dry.  ?Neurological:  ?   General: No focal deficit present.  ?   Mental Status: She is alert. Mental status is at baseline.  ?Psychiatric:     ?   Mood and Affect: Mood normal.     ?   Thought Content: Thought content normal.  ? ?Review of Systems  ?Constitutional: Negative.   ?HENT: Negative.    ?Eyes: Negative.   ?Respiratory: Negative.    ?Cardiovascular: Negative.   ?Gastrointestinal: Negative.   ?Musculoskeletal: Negative.   ?Skin: Negative.   ?Neurological: Negative.   ?Psychiatric/Behavioral: Negative.    ?Blood pressure 100/72, pulse 77, temperature 97.9 ?F (36.6 ?C), temperature source Oral, resp. rate 17, height '5\' 1"'$  (1.549 m), weight 46.3 kg, SpO2 98 %. Body mass index is 19.27 kg/m?. ? ?Mental Status Per Nursing Assessment::   ?On Admission:  NA ? ?Demographic Factors:  ?Low socioeconomic status, Living alone, and Unemployed ? ?Loss Factors: ?Financial problems/change in socioeconomic status ? ?Historical Factors: ?Impulsivity ? ?Risk Reduction Factors:   ?NA ? ?Continued Clinical Symptoms:  ?Bipolar Disorder:   Mixed State ?Alcohol/Substance Abuse/Dependencies ? ?Cognitive Features That Contribute To Risk:  ?Thought constriction (tunnel vision)   ? ?Suicide Risk:  ?Minimal: No identifiable suicidal ideation.  Patients presenting with no risk factors but with morbid ruminations; may be classified as minimal risk based on the severity of  the depressive symptoms ? ? Follow-up Information   ? ? [facility] Follow up.   ?Why: Appointment is scheduled for 7:15AM  02/20/2022 with Lanae Boast, Peer Support  Specialist. ?Contact information: ?[address] ?[place] [address] ?[phone] ? ? ?  ?  ? ?  ?  ? ?  ? ? ?Plan Of Care/Follow-up recommendations:  ?Other:  Follow up at [facility] and continue medication. Currently calm and lucid with no SI ? ?Alethia Berthold, MD ?02/05/2022, 8:59 AM ?

## 2022-04-01 ENCOUNTER — Encounter: Payer: Self-pay | Admitting: Advanced Practice Midwife

## 2022-04-01 ENCOUNTER — Ambulatory Visit (LOCAL_COMMUNITY_HEALTH_CENTER): Payer: Medicaid Other | Admitting: Advanced Practice Midwife

## 2022-04-01 VITALS — BP 118/81 | Ht 61.6 in | Wt 101.4 lb

## 2022-04-01 DIAGNOSIS — Z3202 Encounter for pregnancy test, result negative: Secondary | ICD-10-CM | POA: Diagnosis not present

## 2022-04-01 DIAGNOSIS — F1110 Opioid abuse, uncomplicated: Secondary | ICD-10-CM

## 2022-04-01 DIAGNOSIS — T7412XA Child physical abuse, confirmed, initial encounter: Secondary | ICD-10-CM | POA: Insufficient documentation

## 2022-04-01 DIAGNOSIS — Z3009 Encounter for other general counseling and advice on contraception: Secondary | ICD-10-CM | POA: Diagnosis not present

## 2022-04-01 DIAGNOSIS — K589 Irritable bowel syndrome without diarrhea: Secondary | ICD-10-CM | POA: Insufficient documentation

## 2022-04-01 DIAGNOSIS — F17200 Nicotine dependence, unspecified, uncomplicated: Secondary | ICD-10-CM | POA: Diagnosis not present

## 2022-04-01 DIAGNOSIS — T7412XS Child physical abuse, confirmed, sequela: Secondary | ICD-10-CM

## 2022-04-01 DIAGNOSIS — Z30011 Encounter for initial prescription of contraceptive pills: Secondary | ICD-10-CM

## 2022-04-01 DIAGNOSIS — Z9889 Other specified postprocedural states: Secondary | ICD-10-CM

## 2022-04-01 DIAGNOSIS — F312 Bipolar disorder, current episode manic severe with psychotic features: Secondary | ICD-10-CM

## 2022-04-01 DIAGNOSIS — T7421XS Adult sexual abuse, confirmed, sequela: Secondary | ICD-10-CM

## 2022-04-01 DIAGNOSIS — F1410 Cocaine abuse, uncomplicated: Secondary | ICD-10-CM

## 2022-04-01 DIAGNOSIS — R569 Unspecified convulsions: Secondary | ICD-10-CM | POA: Insufficient documentation

## 2022-04-01 DIAGNOSIS — F1510 Other stimulant abuse, uncomplicated: Secondary | ICD-10-CM

## 2022-04-01 DIAGNOSIS — T7421XA Adult sexual abuse, confirmed, initial encounter: Secondary | ICD-10-CM | POA: Insufficient documentation

## 2022-04-01 LAB — HEMOGLOBIN, FINGERSTICK: Hemoglobin: 14.6 g/dL (ref 11.1–15.9)

## 2022-04-01 LAB — HM HEPATITIS C SCREENING LAB: HM Hepatitis Screen: NEGATIVE

## 2022-04-01 LAB — PREGNANCY, URINE: Preg Test, Ur: NEGATIVE

## 2022-04-01 LAB — WET PREP FOR TRICH, YEAST, CLUE
Trichomonas Exam: NEGATIVE
Yeast Exam: NEGATIVE

## 2022-04-01 LAB — HM HIV SCREENING LAB: HM HIV Screening: NEGATIVE

## 2022-04-01 LAB — HEPATITIS B SURFACE ANTIGEN: Hepatitis B Surface Ag: NONREACTIVE

## 2022-04-01 MED ORDER — NORETHINDRONE 0.35 MG PO TABS: 1.0000 | ORAL_TABLET | Freq: Every day | ORAL | 13 refills | Status: DC

## 2022-04-01 NOTE — Progress Notes (Signed)
Pt here for PE and BC.  Wet mount results reviewed, no treatment required per SO.  Pt notified of negative PT and normal Hgb results.  BCCCP pamphlet, PCP and dental list given to pt.  Windle Guard, RN

## 2022-04-01 NOTE — Progress Notes (Signed)
[facility] Number: [phone]    Family Planning Visit- Initial Visit  Subjective:  Autumn [name] is a 46 y.o. seperated WF smoker  G3P2 (16, 12)  being seen today for an initial annual visit and to discuss reproductive life planning.  The patient is currently using No Method - Other Reason for pregnancy prevention. Patient reports she/her/hers  does not want a pregnancy in the next year.    she/her/hers report they are looking for a method that provides High efficacy at preventing pregnancy  Patient has the following medical conditions has Hx of glaucoma; Tobacco use disorder; Methamphetamine abuse ([place]); Opioid use disorder, severe, dependence ([place]); Cannabis use disorder, severe, dependence ([place]); Heroin abuse ([place]); Bipolar affective disorder, current episode manic ([place]); Affective psychosis, bipolar ([place]); Alcohol abuse; CIN II (cervical intraepithelial neoplasia II); History of anorexia nervosa; Fibrocystic breast changes of both breasts; MDD (major depressive disorder), recurrent severe, without psychosis ([place]); MDD (major depressive disorder), recurrent episode, severe ([place]); Cocaine abuse ([place]); Bipolar affective disorder, current episode manic with psychotic symptoms ([place]); Bipolar 1 disorder ([place]); IBS (irritable bowel syndrome); and Seizures ([place]) onset 2003-2008 on their problem list.  Chief Complaint  Patient presents with   Contraception    PE and [facility]    Patient reports here for physical and birth control. LMP 03/20/22. Last sex yesterday without condom; with current partner x 2 mo; 2 partners in last 3 mo. Last pap 05/30/20 neg HPV neg. Hx LEEP 03/1999.  Last PE 06/12/20 @ [place]. Wants ocp's. Smoking 3-10 cpd.  Current vaper. Last cigar yesterday. Last MJ 03/30/22. Last cocaine yesterday (smoking). Last heroin 1 1/2 years ago. Last meth yesterday. Last ecstacy 14 years ago. Last ETOH 03/30/22 (8 shots liquor)  2x/yr. Taking Lithium, Abilify, and Depakote with [facility] as prescriber.  Not working. Not in school. Living sometimes with a female friend.  Hx sex for drugs 12/2021 with 20-30 sex partners in last year as a result.   PHQ-9=20; denies SI/HI and refuses counseling or contact # for [employer], LCSW.   Patient denies/refuses dental list or counseling contact info for Milton Ferguson, LCSW  Body mass index is 18.79 kg/m. - Patient is eligible for diabetes screening based on BMI and age >30?  not applicable SP2Z ordered? not applicable  Patient reports 30-40  partner/s in last year. Desires STI screening?  Yes  Has patient been screened once for HCV in the past?  No   Lab Results  Component Value Date   HCVAB NON REACTIVE 01/26/2022    Does the patient have current drug use (including MJ), have a partner with drug use, and/or has been incarcerated since last result? Yes  If yes-- Screen for HCV through [facility]   Does the patient meet criteria for HBV testing? Yes  Criteria:  -Household, sexual or needle sharing contact with HBV -History of drug use -HIV positive -Those with known Hep C   Health Maintenance Due  Topic Date Due   TETANUS/TDAP  Never done   COVID-19 Vaccine (3 - Booster for Moderna series) 07/23/2020    Review of Systems  Constitutional:  Positive for weight loss (up and down by 7 lbs/wk due to anxiety, hx anorexia).  HENT:  Positive for sore throat (with bronchitis which is resolved).   Gastrointestinal:  Positive for nausea (occassionally when hasn't eaten).  Neurological:  Positive for headaches (1x/mo in different places on head relieved with caffeine or  sleep,-N&V).  All other systems reviewed and are negative.  The following portions of the patient's history were reviewed and updated as appropriate: allergies, current medications, past family history, past medical history, past social history, past surgical history and problem list. Problem list  updated.   See flowsheet for other program required questions.  Objective:   Vitals:   04/01/22 1321  BP: 118/81  Weight: 101 lb 6.4 oz (46 kg)  Height: 5' 1.6" (1.565 m)    Physical Exam Constitutional:      Appearance: Normal appearance. She is normal weight.  HENT:     Head: Normocephalic and atraumatic.     Mouth/Throat:     Mouth: Mucous membranes are moist.     Comments: Missing teeth, caries, last dental exam 3 years ago and refuses to go to dentist Eyes:     Conjunctiva/sclera: Conjunctivae normal.  Neck:     Thyroid: No thyroid mass, thyromegaly or thyroid tenderness.  Cardiovascular:     Rate and Rhythm: Normal rate and regular rhythm.  Pulmonary:     Effort: Pulmonary effort is normal.     Breath sounds: Normal breath sounds.  Chest:  Breasts:    Right: Normal.       Comments: Left breast with sl tender mass 4:00 position at edge of areola .5 cm oval;  pt to call BCCCP for mammogram; extensive bx's and u/s and mammograms done Abdominal:     General: Abdomen is flat.     Palpations: Abdomen is soft.     Comments: Soft, scaphoid without masses and sl suprapubic tenderness  Genitourinary:    General: Normal vulva.     Exam position: Lithotomy position.     Vagina: Vaginal discharge (white creamy leukorrhea, ph<4.5) present.     Cervix: No cervical motion tenderness.     Uterus: Normal.      Adnexa: Right adnexa normal and left adnexa normal.     Rectum: Normal.  Musculoskeletal:        General: Normal range of motion.     Cervical back: Normal range of motion and neck supple.  Skin:    General: Skin is warm and dry.  Neurological:     Mental Status: She is alert.  Psychiatric:        Mood and Affect: Mood normal.      Assessment and Plan:  Autumn [name] is a 46 y.o. female presenting to the [facility] Department for an initial annual wellness/contraceptive visit  Contraception counseling: Reviewed options based on patient desire  and reproductive life plan. Patient is interested in Oral Contraceptive. This was provided to the patient today.  if not why not clearly documented  Risks, benefits, and typical effectiveness rates were reviewed.  Questions were answered.  Written information was also given to the patient to review.    The patient will follow up in  1 years for surveillance.  The patient was told to call with any further questions, or with any concerns about this method of contraception.  Emphasized use of condoms 100% of the time for STI prevention.  Need for ECP was assessed. Patient reported Unprotected sex within past 72 hours.  Reviewed options and patient desired No method of ECP, declined all    1. Family planning If PT neg then may have Micronor #13 I po daily to begin today; please counsel on need for abstinance/back up condoms next 7 days Please counsel pt if no menses after completion of first pack then  do PT and call us if + Pt counseled to stop smoking due to increased risk PE, MI, stroke Pt refuses Plan B today Please give pt BCCCP # and instruct her to have mammogram asap Please give primary care MD list to pt Please give dental list to pt Pt refuses counseling with Milton Ferguson, LCSW Pt refuses assistance to stop drug use Please give pt condoms  - WET PREP FOR [place], YEAST, CLUE - Pregnancy, urine - Chlamydia/Gonorrhea [facility] - HIV/HCV [place] [facility] - HBV Antigen/Antibody [facility] - Syphilis Serology, [facility] - Hemoglobin, venipuncture  2. Encounter for initial prescription of contraceptive pills Micronor #13 I po daily to begin tomorrow; see above note   3. Irritable bowel syndrome, unspecified type Not on meds  4. Cocaine abuse ([place]) Last use yesterday smoked  5. Tobacco use disorder Counseled via [facility] to stop smoking  6. Bipolar affective disorder, current episode manic with psychotic symptoms ([place]) Refuses counseling or contact info for [employer],  LCSW  7. Seizures ([place]) onset 2003-2008 Taking Depakote  8. Heroin abuse ([place]) Last use 1 1/2 years ago  19. Methamphetamine abuse ([place]) Last use yesterday  10. MJ use Last use 03/30/22  11. Ecstacy Last use 14 years ago     No follow-ups on file.  No future appointments.  Herbie Saxon, CNM

## 2022-04-13 ENCOUNTER — Emergency Department
Admission: EM | Admit: 2022-04-13 | Discharge: 2022-04-13 | Disposition: A | Discharge status: Home / Self Care | Payer: Medicaid Other | Attending: Emergency Medicine | Admitting: Emergency Medicine

## 2022-04-13 ENCOUNTER — Other Ambulatory Visit: Payer: Self-pay

## 2022-04-13 ENCOUNTER — Encounter: Payer: Self-pay | Admitting: Emergency Medicine

## 2022-04-13 DIAGNOSIS — K0889 Other specified disorders of teeth and supporting structures: Secondary | ICD-10-CM | POA: Insufficient documentation

## 2022-04-13 DIAGNOSIS — F17210 Nicotine dependence, cigarettes, uncomplicated: Secondary | ICD-10-CM | POA: Insufficient documentation

## 2022-04-13 MED ORDER — PENICILLIN V POTASSIUM 250 MG PO TABS: 250.0000 mg | ORAL_TABLET | Freq: Four times a day (QID) | ORAL | 0 refills | Status: DC

## 2022-04-13 MED ORDER — NAPROXEN 500 MG PO TABS: 500.0000 mg | ORAL_TABLET | Freq: Two times a day (BID) | ORAL | 2 refills | Status: DC

## 2022-04-13 NOTE — ED Triage Notes (Signed)
C/O right sided dental pain

## 2022-04-13 NOTE — ED Provider Notes (Signed)
   [facility] Provider Note    Event Date/Time   First MD Initiated Contact with Patient 04/13/22 1206     (approximate)   History   Dental Pain   HPI  Autumn [name] is a 46 y.o. female who has a history of bipolar disorder, seizures, she does smoke cigarettes who presents with complaints of dental pain.  Patient complains of right lower posterior molar dental pain x1 to 2 days.  No fevers chills, no difficulty swallowing, no intraoral swelling     Physical Exam   Triage Vital Signs: ED Triage Vitals  Enc Vitals Group     BP 04/13/22 1202 103/74     Pulse Rate 04/13/22 1202 94     Resp 04/13/22 1202 16     Temp 04/13/22 1202 98.6 F (37 C)     Temp Source 04/13/22 1202 Oral     SpO2 04/13/22 1202 97 %     Weight 04/13/22 1156 45.9 kg (101 lb 3.1 oz)     Height --      Head Circumference --      Peak Flow --      Pain Score 04/13/22 1155 8     Pain Loc --      Pain Edu? --      Excl. in [place]? --     Most recent vital signs: Vitals:   04/13/22 1202 04/13/22 1216  BP: 103/74 111/77  Pulse: 94 (!) 101  Resp: 16 18  Temp: 98.6 F (37 C) 98.5 F (36.9 C)  SpO2: 97% 98%     General: Awake, no distress.  CV:  Good peripheral perfusion.  Resp:  Normal effort.  Abd:  No distention.  Other:  Poor dentition, mild erythema of the gums surrounding posterior right molar, lower, no abscess, pharynx otherwise normal   ED Results / Procedures / Treatments   Labs (all labs ordered are listed, but only abnormal results are displayed) Labs Reviewed - No data to display   EKG     RADIOLOGY     PROCEDURES:  Critical Care performed:   Procedures   MEDICATIONS ORDERED IN ED: Medications - No data to display   IMPRESSION / MDM / [place] / ED COURSE  I reviewed the triage vital signs and the nursing notes. Patient's presentation is most consistent with acute, uncomplicated illness.  Patient presents with dental pain  as above  Differential includes dental infection, cavity, abscess  Exam is overall reassuring, suspect mild dental infection, will start the patient on penicillin, outpatient follow-up with dentist recommended      FINAL CLINICAL IMPRESSION(S) / ED DIAGNOSES   Final diagnoses:  Pain, dental     Rx / DC Orders   ED Discharge Orders          Ordered    naproxen (NAPROSYN) 500 MG tablet  2 times daily with meals        04/13/22 1208    penicillin v potassium (VEETID) 250 MG tablet  4 times daily        04/13/22 1208             Note:  This document was prepared using Dragon voice recognition software and may include unintentional dictation errors.   Lavonia Drafts, MD 04/13/22 1352

## 2022-04-21 ENCOUNTER — Encounter: Payer: Self-pay | Admitting: Emergency Medicine

## 2022-04-21 DIAGNOSIS — M25512 Pain in left shoulder: Secondary | ICD-10-CM | POA: Insufficient documentation

## 2022-04-21 DIAGNOSIS — M25552 Pain in left hip: Secondary | ICD-10-CM | POA: Insufficient documentation

## 2022-04-21 DIAGNOSIS — M25572 Pain in left ankle and joints of left foot: Secondary | ICD-10-CM | POA: Insufficient documentation

## 2022-04-21 DIAGNOSIS — Z20822 Contact with and (suspected) exposure to covid-19: Secondary | ICD-10-CM | POA: Insufficient documentation

## 2022-04-21 DIAGNOSIS — W19XXXA Unspecified fall, initial encounter: Secondary | ICD-10-CM | POA: Insufficient documentation

## 2022-04-21 DIAGNOSIS — F1510 Other stimulant abuse, uncomplicated: Secondary | ICD-10-CM | POA: Insufficient documentation

## 2022-04-21 DIAGNOSIS — M25562 Pain in left knee: Secondary | ICD-10-CM | POA: Insufficient documentation

## 2022-04-21 NOTE — ED Triage Notes (Addendum)
Pt presents via POV needing assistance with detox from meth. She notes last using this AM and is requesting to go back to [facility] which is a facility shes been to in the past. Endorses pain left knee pain following a fall she had tonight. Denies hitting her head - not on thinners - denies SI/HI.

## 2022-04-22 ENCOUNTER — Emergency Department
Admission: EM | Admit: 2022-04-22 | Discharge: 2022-04-22 | Disposition: A | Discharge status: Home / Self Care | Payer: Self-pay | Attending: Emergency Medicine | Admitting: Emergency Medicine

## 2022-04-22 ENCOUNTER — Emergency Department: Payer: Self-pay

## 2022-04-22 DIAGNOSIS — W19XXXA Unspecified fall, initial encounter: Secondary | ICD-10-CM

## 2022-04-22 DIAGNOSIS — F1510 Other stimulant abuse, uncomplicated: Secondary | ICD-10-CM

## 2022-04-22 LAB — CBC WITH DIFFERENTIAL/PLATELET
Abs Immature Granulocytes: 0.02 10*3/uL (ref 0.00–0.07)
Basophils Absolute: 0 10*3/uL (ref 0.0–0.1)
Basophils Relative: 1 %
Eosinophils Absolute: 0.1 10*3/uL (ref 0.0–0.5)
Eosinophils Relative: 1 %
HCT: 39.6 % (ref 36.0–46.0)
Hemoglobin: 12.7 g/dL (ref 12.0–15.0)
Immature Granulocytes: 0 %
Lymphocytes Relative: 30 %
Lymphs Abs: 1.5 10*3/uL (ref 0.7–4.0)
MCH: 30.5 pg (ref 26.0–34.0)
MCHC: 32.1 g/dL (ref 30.0–36.0)
MCV: 95 fL (ref 80.0–100.0)
Monocytes Absolute: 0.4 10*3/uL (ref 0.1–1.0)
Monocytes Relative: 9 %
Neutro Abs: 3.1 10*3/uL (ref 1.7–7.7)
Neutrophils Relative %: 59 %
Platelets: 205 10*3/uL (ref 150–400)
RBC: 4.17 MIL/uL (ref 3.87–5.11)
RDW: 12.4 % (ref 11.5–15.5)
WBC: 5.2 10*3/uL (ref 4.0–10.5)
nRBC: 0 % (ref 0.0–0.2)

## 2022-04-22 LAB — COMPREHENSIVE METABOLIC PANEL
ALT: 16 U/L (ref 0–44)
AST: 11 U/L — ABNORMAL LOW (ref 15–41)
Albumin: 3.8 g/dL (ref 3.5–5.0)
Alkaline Phosphatase: 56 U/L (ref 38–126)
Anion gap: 6 (ref 5–15)
BUN: 18 mg/dL (ref 6–20)
CO2: 27 mmol/L (ref 22–32)
Calcium: 8.5 mg/dL — ABNORMAL LOW (ref 8.9–10.3)
Chloride: 106 mmol/L (ref 98–111)
Creatinine, Ser: 0.54 mg/dL (ref 0.44–1.00)
GFR, Estimated: >60 mL/min (ref >60)
Glucose, Bld: 93 mg/dL (ref 70–99)
Potassium: 3.4 mmol/L — ABNORMAL LOW (ref 3.5–5.1)
Sodium: 139 mmol/L (ref 135–145)
Total Bilirubin: 0.4 mg/dL (ref 0.3–1.2)
Total Protein: 6.4 g/dL — ABNORMAL LOW (ref 6.5–8.1)

## 2022-04-22 LAB — ETHANOL: Alcohol, Ethyl (B): <10 mg/dL (ref <10)

## 2022-04-22 LAB — RESP PANEL BY RT-PCR (FLU A&B, COVID) ARPGX2
Influenza A by PCR: NEGATIVE
Influenza B by PCR: NEGATIVE
SARS Coronavirus 2 by RT PCR: NEGATIVE

## 2022-04-22 LAB — VALPROIC ACID LEVEL: Valproic Acid Lvl: 28 ug/mL — ABNORMAL LOW (ref 50.0–100.0)

## 2022-04-22 MED ORDER — ACETAMINOPHEN 500 MG PO TABS
1000.0000 mg | ORAL_TABLET | Freq: Four times a day (QID) | ORAL | Status: DC | PRN
  Administered 2022-04-22: 1000 mg via ORAL
  Filled 2022-04-22: qty 2

## 2022-04-22 MED ORDER — NICOTINE 21 MG/24HR TD PT24
21.0000 mg | MEDICATED_PATCH | Freq: Every day | TRANSDERMAL | Status: DC
  Administered 2022-04-22: 21 mg via TRANSDERMAL
  Filled 2022-04-22: qty 1

## 2022-04-22 NOTE — ED Provider Notes (Signed)
[facility] Provider Note    Event Date/Time   First MD Initiated Contact with Patient 04/22/22 0222     (approximate)   History   Detox   HPI  Autumn [name] is a 46 y.o. female with history of bipolar disorder, seizures, methamphetamine abuse who presents to the emergency department requesting rehab/detox from methamphetamine.  Use just prior to arrival.  Denies any other drug or alcohol use.  No SI or HI.  Does state that she fell coming out of the house tonight and is complaining of left shoulder pain, left hip pain, left knee pain and left ankle pain.  Did not hit her head or lose consciousness.  Not on blood thinners.   History provided by patient.    Past Medical History:  Diagnosis Date   Allergy    Seasonal, Ultram (seizures)   Anemia 2005   Bipolar 1 disorder ([place])    Glaucoma    Seizures ([place]) 2008    Past Surgical History:  Procedure Laterality Date   BREAST EXCISIONAL BIOPSY Right    x 2   CESAREAN SECTION     x 2   LEEP  2000    MEDICATIONS:  Prior to Admission medications   Medication Sig Start Date End Date Taking? Authorizing Provider  ARIPiprazole (ABILIFY) 15 MG tablet Take 1 tablet (15 mg total) by mouth daily. 02/05/22   Clapacs, [name], MD  divalproex (DEPAKOTE) 250 MG DR tablet Take 3 tablets (750 mg total) by mouth every 12 (twelve) hours. 02/05/22   Clapacs, [name], MD  lithium carbonate (LITHOBID) 300 MG CR tablet Take 2 tablets (600 mg total) by mouth every 12 (twelve) hours. 02/05/22   Clapacs, [name], MD  naproxen (NAPROSYN) 500 MG tablet Take 1 tablet (500 mg total) by mouth 2 (two) times daily with a meal. 04/13/22   Lavonia Drafts, MD  nicotine (NICODERM CQ - DOSED IN MG/24 HOURS) 21 mg/24hr patch Place 1 patch (21 mg total) onto the skin daily. 02/05/22   Clapacs, [name], MD  norethindrone (MICRONOR) 0.35 MG tablet Take 1 tablet (0.35 mg total) by mouth daily. 04/01/22   Sciora, Real Cons, CNM  penicillin v  potassium (VEETID) 250 MG tablet Take 1 tablet (250 mg total) by mouth 4 (four) times daily. 04/13/22   Lavonia Drafts, MD  QUEtiapine (SEROQUEL) 400 MG tablet Take 1 tablet (400 mg total) by mouth at bedtime. Patient not taking: Reported on 04/01/2022 02/05/22   Gonzella Lex, MD    Physical Exam   Triage Vital Signs: ED Triage Vitals  Enc Vitals Group     BP 04/21/22 2352 112/74     Pulse Rate 04/21/22 2352 78     Resp 04/21/22 2352 20     Temp 04/21/22 2352 98.3 F (36.8 C)     Temp Source 04/21/22 2352 Oral     SpO2 04/21/22 2352 100 %     Weight 04/21/22 2353 105 lb 13.1 oz (48 kg)     Height 04/21/22 2353 '5\' 1"'$  (1.549 m)     Head Circumference --      Peak Flow --      Pain Score 04/21/22 2353 6     Pain Loc --      Pain Edu? --      Excl. in [place]? --     Most recent vital signs: Vitals:   04/21/22 2352  BP: 112/74  Pulse: 78  Resp: 20  Temp: 98.3 F (36.8 C)  SpO2: 100%     CONSTITUTIONAL: Alert and oriented and responds appropriately to questions. Well-appearing; well-nourished; GCS 15 HEAD: Normocephalic; atraumatic EYES: Conjunctivae clear, PERRL, EOMI ENT: normal nose; no rhinorrhea; moist mucous membranes; pharynx without lesions noted; no dental injury; no septal hematoma, no epistaxis; no facial deformity or bony tenderness NECK: Supple, no midline spinal tenderness, step-off or deformity; trachea midline CARD: RRR; S1 and S2 appreciated; no murmurs, no clicks, no rubs, no gallops RESP: Normal chest excursion without splinting or tachypnea; breath sounds clear and equal bilaterally; no wheezes, no rhonchi, no rales; no hypoxia or respiratory distress CHEST:  chest wall stable, no crepitus or ecchymosis or deformity, nontender to palpation; no flail chest ABD/GI: Normal bowel sounds; non-distended; soft, non-tender, no rebound, no guarding; no ecchymosis or other lesions noted PELVIS:  stable, nontender to palpation BACK:  The back appears normal; no midline  spinal tenderness, step-off or deformity EXT: Normal ROM in all joints; non-tender to palpation; no edema; normal capillary refill; no cyanosis, no bony tenderness or bony deformity of patient's extremities, no joint effusion, compartments are soft, extremities are warm and well-perfused, no ecchymosis SKIN: Normal color for age and race; warm NEURO: No facial asymmetry, normal speech, moving all extremities equally PSYCH: No SI or HI.  Not responding to internal stimuli.  Calm and cooperative.  ED Results / Procedures / Treatments   LABS: (all labs ordered are listed, but only abnormal results are displayed) Labs Reviewed  COMPREHENSIVE METABOLIC PANEL - Abnormal; Notable for the following components:      Result Value   Potassium 3.4 (*)    Calcium 8.5 (*)    Total Protein 6.4 (*)    AST 11 (*)    All other components within normal limits  RESP PANEL BY RT-PCR (FLU A&B, COVID) ARPGX2  CBC WITH DIFFERENTIAL/PLATELET  ETHANOL  URINE DRUG SCREEN, QUALITATIVE ([facility] ONLY)  VALPROIC ACID LEVEL  POC URINE PREG, ED     EKG:    RADIOLOGY: My personal review and interpretation of imaging: X-rays show no fracture or dislocation.  I have personally reviewed all radiology reports. DG Ankle Complete Left  Result Date: 04/22/2022 CLINICAL DATA:  Twisting injury. EXAM: LEFT ANKLE COMPLETE - 3+ VIEW COMPARISON:  None Available. FINDINGS: There is no evidence of fracture, dislocation, or joint effusion. There is no evidence of arthropathy or other focal bone abnormality. Soft tissues are unremarkable. IMPRESSION: Negative. Electronically Signed   By: Telford Nab M.D.   On: 04/22/2022 03:01   DG Hip Unilat W or Wo Pelvis 2-3 Views Left  Result Date: 04/22/2022 CLINICAL DATA:  Fall, pain EXAM: DG HIP (WITH OR WITHOUT PELVIS) 2-3V LEFT COMPARISON:  None Available. FINDINGS: There is no evidence of hip fracture or dislocation. There is no evidence of arthropathy or other focal bone  abnormality. IMPRESSION: Negative. Electronically Signed   By: Rolm Baptise M.D.   On: 04/22/2022 01:10   DG Shoulder Left  Result Date: 04/22/2022 CLINICAL DATA:  Fall, pain EXAM: LEFT SHOULDER - 2+ VIEW COMPARISON:  None Available. FINDINGS: There is no evidence of fracture or dislocation. There is no evidence of arthropathy or other focal bone abnormality. Soft tissues are unremarkable. IMPRESSION: Negative. Electronically Signed   By: Rolm Baptise M.D.   On: 04/22/2022 01:10   DG Knee Complete 4 Views Left  Result Date: 04/22/2022 CLINICAL DATA:  Fall, pain EXAM: LEFT KNEE - COMPLETE 4+ VIEW COMPARISON:  None Available. FINDINGS:  No evidence of fracture, dislocation, or joint effusion. No evidence of arthropathy or other focal bone abnormality. Soft tissues are unremarkable. IMPRESSION: Negative. Electronically Signed   By: Rolm Baptise M.D.   On: 04/22/2022 01:10     PROCEDURES:  Critical Care performed: No     Procedures    IMPRESSION / MDM / ASSESSMENT AND PLAN / ED COURSE  I reviewed the triage vital signs and the nursing notes.  Patient here requesting detox, rehabilitation for methamphetamine abuse.  Also reports fall with pain in the left upper and lower extremity.    DIFFERENTIAL DIAGNOSIS (includes but not limited to):   Methamphetamine abuse, intoxication, contusion, less likely fracture or dislocation  Patient's presentation is most consistent with acute presentation with potential threat to life or bodily function.  PLAN: Patient here requesting detox and rehabilitation for methamphetamine abuse.  No signs of withdrawal or intoxication currently.  Also reports a mechanical fall tonight.  Complaining of left shoulder, knee, hip and ankle pain.  Will obtain x-rays.  Will obtain screening labs, urine.  Will consult TTS.  No emergent indication for psychiatric evaluation today.  No SI or HI.   MEDICATIONS GIVEN IN ED: Medications  nicotine (NICODERM CQ - dosed in  mg/24 hours) patch 21 mg (21 mg Transdermal Patch Applied 04/22/22 0241)  acetaminophen (TYLENOL) tablet 1,000 mg (1,000 mg Oral Given 04/22/22 0240)     ED COURSE: X-rays reviewed/interpreted by myself and radiology and show no fracture, dislocation.   3:20 AM  Pt seen by TTS and now states that she no longer wants rehab, detox.  Will discharge and provided with outpatient resources.  Patient's labs are unremarkable.  Normal hemoglobin, electrolytes, renal function, LFTs.   At this time, I do not feel there is any life-threatening condition present. I reviewed all nursing notes, vitals, pertinent previous records.  All lab and urine results, EKGs, imaging ordered have been independently reviewed and interpreted by myself.  I reviewed all available radiology reports from any imaging ordered this visit.  Based on my assessment, I feel the patient is safe to be discharged home without further emergent workup and can continue workup as an outpatient as needed. Discussed all findings, treatment plan as well as usual and customary return precautions with patient.  They verbalize understanding and are comfortable with this plan.  Outpatient follow-up has been provided as needed.  All questions have been answered.   CONSULTS: TTS consulted for further disposition.   OUTSIDE RECORDS REVIEWED: Reviewed patient's last psychiatric admission in March 2023.       FINAL CLINICAL IMPRESSION(S) / ED DIAGNOSES   Final diagnoses:  Methamphetamine abuse ([place])  Fall, initial encounter     Rx / DC Orders   ED Discharge Orders     None        Note:  This document was prepared using Dragon voice recognition software and may include unintentional dictation errors.   Tyjon Bowen, Delice Bison, DO 04/22/22 [phone]

## 2022-04-22 NOTE — ED Notes (Signed)
Pt states she neglected to mention pain in her left shoulder and hip and wishes to have imaging in those areas. Pt declined to take a pregnancy test - pt agreed to sign pregnancy wavier form.

## 2022-04-22 NOTE — Discharge Instructions (Signed)
Steps to find a Primary Care Provider (PCP):  Call [phone] or [phone] to access "[facility] Find a Doctor Service."  2.  You may also go on the [facility] website at [email or web address]  

## 2022-04-22 NOTE — ED Notes (Signed)
Pt received DC instructions at this time.  Pt verbalized understanding of DC instructions.  Pt ambulatory to lobby at this time.  NAD noted.

## 2022-04-28 ENCOUNTER — Emergency Department
Admission: EM | Admit: 2022-04-28 | Discharge: 2022-04-28 | Disposition: A | Discharge status: Home / Self Care | Payer: Medicaid Other | Attending: Emergency Medicine | Admitting: Emergency Medicine

## 2022-04-28 ENCOUNTER — Other Ambulatory Visit: Payer: Self-pay

## 2022-04-28 DIAGNOSIS — Z760 Encounter for issue of repeat prescription: Secondary | ICD-10-CM | POA: Insufficient documentation

## 2022-04-28 DIAGNOSIS — Z5900 Homelessness unspecified: Secondary | ICD-10-CM | POA: Insufficient documentation

## 2022-04-28 MED ORDER — DIVALPROEX SODIUM 250 MG PO DR TAB: 500.0000 mg | DELAYED_RELEASE_TABLET | Freq: Two times a day (BID) | ORAL | 1 refills | Status: DC

## 2022-04-28 MED ORDER — QUETIAPINE FUMARATE 200 MG PO TABS: 200.0000 mg | ORAL_TABLET | Freq: Every day | ORAL | 2 refills | Status: DC

## 2022-04-28 MED ORDER — ARIPIPRAZOLE 15 MG PO TABS: 15.0000 mg | ORAL_TABLET | Freq: Every day | ORAL | 2 refills | Status: DC

## 2022-04-28 NOTE — ED Triage Notes (Signed)
Pt via POV from home. Pt states she needs a refill on her Abilify and Depakote. Pt has a hx of Bipolar I Disorder. Denies SI/HI. Pt is A&Ox4 and NAD.

## 2022-04-28 NOTE — ED Notes (Signed)
Pt reports she needs medications for her mental illnesses including bipolar and multiple personality disorder. Pt states she wants to see a psychiatric doctor. Pt asked if she was going to be tested to see if she was pregnant. Pt reports having sex with multiple partners over the last few weeks. Pt also states that she needs her mental health medications so that she can "stop doing drugs on the street." Pts states "I have done fentanyl three time."

## 2022-04-28 NOTE — ED Provider Notes (Signed)
[facility] Provider Note    Event Date/Time   First MD Initiated Contact with Patient 04/28/22 1644     (approximate)   History   Chief Complaint Medication Refill   HPI  Autumn [name] is a 46 y.o. female with past medical history of bipolar disorder, seizures, and polysubstance abuse who presents to the ED requesting medication refill.  Patient reports that she was recently kicked out of a local homeless shelter due to drug use, reports losing her medications upon leaving the shelter.  She states that she has been clean from substance use for about the past 5 days, was told by her mother that she can come stay with her if she remains clean.  She would like to start back on her medications, but states some of the doses were "too much for me."  She is specifically concerned that lithium, Depakote, and Seroquel were "too strong."  She currently denies any complaints, specifically denies any suicidal or homicidal ideation.     Physical Exam   Triage Vital Signs: ED Triage Vitals  Enc Vitals Group     BP 04/28/22 1514 111/77     Pulse Rate 04/28/22 1514 98     Resp 04/28/22 1514 20     Temp 04/28/22 1514 98.2 F (36.8 C)     Temp Source 04/28/22 1514 Oral     SpO2 04/28/22 1514 98 %     Weight 04/28/22 1514 102 lb 4.7 oz (46.4 kg)     Height 04/28/22 1512 '5\' 1"'$  (1.549 m)     Head Circumference --      Peak Flow --      Pain Score 04/28/22 1512 0     Pain Loc --      Pain Edu? --      Excl. in [place]? --     Most recent vital signs: Vitals:   04/28/22 1514  BP: 111/77  Pulse: 98  Resp: 20  Temp: 98.2 F (36.8 C)  SpO2: 98%    Constitutional: Alert and oriented. Eyes: Conjunctivae are normal. Head: Atraumatic. Nose: No congestion/rhinnorhea. Mouth/Throat: Mucous membranes are moist.  Cardiovascular: Normal rate, regular rhythm. Grossly normal heart sounds.  2+ radial pulses bilaterally. Respiratory: Normal respiratory effort.  No  retractions. Lungs CTAB. Gastrointestinal: Soft and nontender. No distention. Musculoskeletal: No lower extremity tenderness nor edema.  Neurologic:  Normal speech and language. No gross focal neurologic deficits are appreciated.    ED Results / Procedures / Treatments   Labs (all labs ordered are listed, but only abnormal results are displayed) Labs Reviewed - No data to display   PROCEDURES:  Critical Care performed: No  Procedures   MEDICATIONS ORDERED IN ED: Medications - No data to display   IMPRESSION / MDM / [place] / ED COURSE  I reviewed the triage vital signs and the nursing notes.                              46 y.o. female with past medical history of seizures, bipolar disorder, and polysubstance abuse who presents to the ED requesting refills of their medications after there is were lost upon being kicked out of a local homeless shelter.   Patient's presentation is most consistent with acute, uncomplicated illness.  Differential diagnosis includes, but is not limited to, encounter for medication refill, psychiatric illness, substance abuse.  Patient well-appearing and in no acute distress, vital  signs are unremarkable and she denies any medical or psychiatric complaints at this time.  She denies any recent drug use and states she has a place to stay with family as long as she remains clean from substance use.  We will refill her prior medications with lower dose of Depakote and Seroquel at patient request, hold off on refill for lithium until she is able to follow-up with [facility].  She was counseled to reestablish care with [facility] and to return to the ED for new worsening symptoms, patient agrees with plan.      FINAL CLINICAL IMPRESSION(S) / ED DIAGNOSES   Final diagnoses:  Medication refill  Homelessness     Rx / DC Orders   ED Discharge Orders          Ordered    ARIPiprazole (ABILIFY) 15 MG tablet  Daily        04/28/22 1713    divalproex  (DEPAKOTE) 250 MG DR tablet  Every 12 hours        04/28/22 1713    QUEtiapine (SEROQUEL) 200 MG tablet  Daily at bedtime        04/28/22 1713             Note:  This document was prepared using Dragon voice recognition software and may include unintentional dictation errors.   Blake Divine, MD 04/28/22 1721

## 2022-04-29 NOTE — Congregational Nurse Program (Signed)
  Dept: [place] Nurse Program Note  Date of Encounter: 04/29/2022 Client to clinic to today to use the center phone. She was seen in the [facility] ED yesterday for a medication refill, but has been unable to obtain her medications. She reached out to family using the center phone, but was not successful in reaching them. She is hopeful they will contact her. Much emotional support and active listening provided. She was encouraged again to go to [facility] where she had established care at some point. Past Medical History: Past Medical History:  Diagnosis Date   Allergy    Seasonal, Ultram (seizures)   Anemia 2005   Bipolar 1 disorder ([place])    Glaucoma    Seizures ([place]) 2008    Encounter Details:  CNP Questionnaire - 04/29/22 1221       Questionnaire   Do you give verbal consent to treat you today? Yes    Location Patient [place] or Organization    Patient Status Homeless    Insurance Uninsured ([employer]s/Self-Pay)    Insurance Referral N/A    Medication Have Medication Insecurities    Medical Provider No   client has been to [facility] previuosly   Screening Referrals N/A    Medical Referral Other    Medical Appointment Made N/A    Food Have Food Insecurities    Transportation Need transportation assistance    Housing/Utilities No permanent housing    Interpersonal Safety N/A   client is living on the street, which is not a safe enviroment   Intervention Counsel;Support    ED Visit Averted N/A    Life-Saving Intervention Made N/A

## 2022-05-04 NOTE — Congregational Nurse Program (Signed)
  Dept: [phone]   Congregational Nurse Program Note  Date of Encounter: 05/04/2022 Client to clinic today requesting to weigh herself. She appears disheveled and anxious. Encouraged her again to go to [facility]. Aloe lotion applied to sunburn on her face.  Past Medical History: Past Medical History:  Diagnosis Date   Allergy    Seasonal, Ultram (seizures)   Anemia 2005   Bipolar 1 disorder ([facility])    Glaucoma    Seizures ([facility]) 2008    Encounter Details:  CNP Questionnaire - 05/04/22 1126       Questionnaire   Do you give verbal consent to treat you today? Yes    Location Patient Served  [facility]    Visit Setting [place] or Organization    Patient Status Homeless    Insurance Uninsured ([employer]s/Self-Pay)    Insurance Referral N/A    Medication Have Medication Insecurities    Medical Provider No   client has been to [facility] previuosly   Screening Referrals N/A    Medical Referral N/A    Medical Appointment Made N/A    Food Have Food Insecurities    Transportation Need transportation assistance    Housing/Utilities No permanent housing    Interpersonal Safety N/A   client is living on the street, which is not a safe enviroment   Intervention Counsel;Support    ED Visit Averted N/A    Life-Saving Intervention Made N/A

## 2022-05-20 ENCOUNTER — Encounter: Payer: Self-pay | Admitting: Emergency Medicine

## 2022-05-20 ENCOUNTER — Emergency Department
Admission: EM | Admit: 2022-05-20 | Discharge: 2022-05-21 | Disposition: A | Discharge status: Home / Self Care | Payer: Medicaid Other | Attending: Emergency Medicine | Admitting: Emergency Medicine

## 2022-05-20 ENCOUNTER — Other Ambulatory Visit: Payer: Self-pay

## 2022-05-20 DIAGNOSIS — F3110 Bipolar disorder, current episode manic without psychotic features, unspecified: Secondary | ICD-10-CM | POA: Diagnosis present

## 2022-05-20 DIAGNOSIS — F319 Bipolar disorder, unspecified: Secondary | ICD-10-CM | POA: Insufficient documentation

## 2022-05-20 DIAGNOSIS — F3112 Bipolar disorder, current episode manic without psychotic features, moderate: Secondary | ICD-10-CM | POA: Diagnosis not present

## 2022-05-20 DIAGNOSIS — D72829 Elevated white blood cell count, unspecified: Secondary | ICD-10-CM | POA: Insufficient documentation

## 2022-05-20 DIAGNOSIS — F1220 Cannabis dependence, uncomplicated: Secondary | ICD-10-CM | POA: Diagnosis present

## 2022-05-20 DIAGNOSIS — F17210 Nicotine dependence, cigarettes, uncomplicated: Secondary | ICD-10-CM | POA: Insufficient documentation

## 2022-05-20 DIAGNOSIS — Z20822 Contact with and (suspected) exposure to covid-19: Secondary | ICD-10-CM | POA: Insufficient documentation

## 2022-05-20 LAB — ETHANOL: Alcohol, Ethyl (B): <10 mg/dL (ref <10)

## 2022-05-20 LAB — COMPREHENSIVE METABOLIC PANEL
ALT: 17 U/L (ref 0–44)
AST: 9 U/L — ABNORMAL LOW (ref 15–41)
Albumin: 4.1 g/dL (ref 3.5–5.0)
Alkaline Phosphatase: 75 U/L (ref 38–126)
Anion gap: 8 (ref 5–15)
BUN: 12 mg/dL (ref 6–20)
CO2: 23 mmol/L (ref 22–32)
Calcium: 9 mg/dL (ref 8.9–10.3)
Chloride: 108 mmol/L (ref 98–111)
Creatinine, Ser: 0.69 mg/dL (ref 0.44–1.00)
GFR, Estimated: >60 mL/min (ref >60)
Glucose, Bld: 109 mg/dL — ABNORMAL HIGH (ref 70–99)
Potassium: 3.5 mmol/L (ref 3.5–5.1)
Sodium: 139 mmol/L (ref 135–145)
Total Bilirubin: 0.5 mg/dL (ref 0.3–1.2)
Total Protein: 6.9 g/dL (ref 6.5–8.1)

## 2022-05-20 LAB — CBC
HCT: 41.6 % (ref 36.0–46.0)
Hemoglobin: 13.3 g/dL (ref 12.0–15.0)
MCH: 29.6 pg (ref 26.0–34.0)
MCHC: 32 g/dL (ref 30.0–36.0)
MCV: 92.7 fL (ref 80.0–100.0)
Platelets: 353 10*3/uL (ref 150–400)
RBC: 4.49 MIL/uL (ref 3.87–5.11)
RDW: 12.5 % (ref 11.5–15.5)
WBC: 15.3 10*3/uL — ABNORMAL HIGH (ref 4.0–10.5)
nRBC: 0 % (ref 0.0–0.2)

## 2022-05-20 LAB — URINE DRUG SCREEN, QUALITATIVE (ARMC ONLY)
Amphetamines, Ur Screen: NOT DETECTED
Barbiturates, Ur Screen: NOT DETECTED
Benzodiazepine, Ur Scrn: NOT DETECTED
Cannabinoid 50 Ng, Ur ~~LOC~~: POSITIVE — AB
Cocaine Metabolite,Ur ~~LOC~~: NOT DETECTED
MDMA (Ecstasy)Ur Screen: NOT DETECTED
Methadone Scn, Ur: NOT DETECTED
Opiate, Ur Screen: NOT DETECTED
Phencyclidine (PCP) Ur S: NOT DETECTED
Tricyclic, Ur Screen: NOT DETECTED

## 2022-05-20 LAB — LITHIUM LEVEL: Lithium Lvl: <0.06 mmol/L — ABNORMAL LOW (ref 0.60–1.20)

## 2022-05-20 LAB — SALICYLATE LEVEL: Salicylate Lvl: <7 mg/dL — ABNORMAL LOW (ref 7.0–30.0)

## 2022-05-20 LAB — VALPROIC ACID LEVEL: Valproic Acid Lvl: <10 ug/mL — ABNORMAL LOW (ref 50.0–100.0)

## 2022-05-20 LAB — ACETAMINOPHEN LEVEL: Acetaminophen (Tylenol), Serum: <10 ug/mL — ABNORMAL LOW (ref 10–30)

## 2022-05-20 MED ORDER — OLANZAPINE 10 MG IM SOLR
5.0000 mg | Freq: Once | INTRAMUSCULAR | Status: AC
  Administered 2022-05-20: 5 mg via INTRAMUSCULAR
  Filled 2022-05-20: qty 10

## 2022-05-20 MED ORDER — DIPHENHYDRAMINE HCL 50 MG/ML IJ SOLN
25.0000 mg | Freq: Once | INTRAMUSCULAR | Status: AC
Start: 2022-05-20 — End: 2022-05-20
  Administered 2022-05-20: 25 mg via INTRAMUSCULAR
  Filled 2022-05-20: qty 1

## 2022-05-20 MED ORDER — ZIPRASIDONE MESYLATE 20 MG IM SOLR
20.0000 mg | Freq: Once | INTRAMUSCULAR | Status: AC
  Administered 2022-05-20: 20 mg via INTRAMUSCULAR
  Filled 2022-05-20: qty 20

## 2022-05-20 MED ORDER — LITHIUM CARBONATE ER 300 MG PO TBCR
600.0000 mg | EXTENDED_RELEASE_TABLET | Freq: Two times a day (BID) | ORAL | Status: DC
  Administered 2022-05-20 – 2022-05-21 (×2): 600 mg via ORAL
  Filled 2022-05-20 (×3): qty 2

## 2022-05-20 MED ORDER — NICOTINE 21 MG/24HR TD PT24
21.0000 mg | MEDICATED_PATCH | Freq: Every day | TRANSDERMAL | Status: DC
  Administered 2022-05-21: 21 mg via TRANSDERMAL
  Filled 2022-05-20 (×2): qty 1

## 2022-05-20 MED ORDER — ACETAMINOPHEN 325 MG PO TABS: 650.0000 mg | ORAL_TABLET | ORAL | Status: DC | PRN

## 2022-05-20 MED ORDER — DIPHENHYDRAMINE HCL 50 MG/ML IJ SOLN
50.0000 mg | Freq: Once | INTRAMUSCULAR | Status: AC
  Administered 2022-05-20: 50 mg via INTRAMUSCULAR
  Filled 2022-05-20: qty 1

## 2022-05-20 MED ORDER — DIVALPROEX SODIUM 500 MG PO DR TAB
500.0000 mg | DELAYED_RELEASE_TABLET | Freq: Two times a day (BID) | ORAL | Status: DC
  Administered 2022-05-20 – 2022-05-21 (×2): 500 mg via ORAL
  Filled 2022-05-20 (×2): qty 1

## 2022-05-20 MED ORDER — ARIPIPRAZOLE 15 MG PO TABS
15.0000 mg | ORAL_TABLET | Freq: Every day | ORAL | Status: DC
  Administered 2022-05-20 – 2022-05-21 (×2): 15 mg via ORAL
  Filled 2022-05-20 (×2): qty 1

## 2022-05-20 MED ORDER — QUETIAPINE FUMARATE 200 MG PO TABS: 200.0000 mg | ORAL_TABLET | Freq: Every day | ORAL | Status: DC

## 2022-05-20 MED ORDER — LORAZEPAM 2 MG/ML IJ SOLN
2.0000 mg | Freq: Once | INTRAMUSCULAR | Status: AC
Start: 2022-05-20 — End: 2022-05-20
  Administered 2022-05-20: 2 mg via INTRAMUSCULAR
  Filled 2022-05-20: qty 1

## 2022-05-20 NOTE — ED Triage Notes (Addendum)
Pt via [employer] from [place] at [facility], staff states she has been of her medication and has been more aggressive. When asked when the last time she took her medication, she states never. Pt has has random outburst of aggressive. Pt is not physically aggressive but is verbally. Aggression is unprovoked. Pt is calm at certain times and was mostly cooperative. Pt will randomly state, "I'm back to reality"   Denies SI/HI. Denies AVH.   Pt belongings include:  1 pink sports bra  1 pair of pink leggings  1 black sock, pt refused

## 2022-05-20 NOTE — ED Notes (Signed)
IVC PENDING  CONSULT ?
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
## 2022-05-20 NOTE — ED Notes (Signed)
First Nurse Note: Pt to ED via [employer]. Pt here  for voluntary commitment

## 2022-05-20 NOTE — ED Notes (Signed)
Pt up to restroom with steady gait.

## 2022-05-20 NOTE — ED Notes (Signed)
Patient tray was provided. Patient refused.

## 2022-05-20 NOTE — ED Notes (Signed)
Pt yelling in the hall, taking her recliner and knocking it over, yelling about wanting to eat. Pt was informed that food was coming for her shortly. Continued to yell about food. Provider notified.
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
## 2022-05-20 NOTE — ED Notes (Signed)
Patient sleep. Unable to wake up to offer snack.

## 2022-05-20 NOTE — ED Notes (Signed)
Pt resting quietly on recliner in hallway with eyes closed and even respirations. Officer and security guard next to pt to ensure pt and staff safety. No distress noted at this time.

## 2022-05-20 NOTE — Consult Note (Signed)
[facility] Face-to-Face Psychiatry Consult   Reason for Consult:  medication non-adherence  Referring Physician:  Ellender Hose Patient Identification: Afrah Burlison MRN:  [id number] Principal Diagnosis: Bipolar affective disorder, current episode manic ([place]) Diagnosis:  Principal Problem:   Bipolar affective disorder, current episode manic ([place]) Active Problems:   Cannabis use disorder, severe, dependence ([place])   Total Time spent with patient: 30 minutes  Subjective:   Autumn [name] is a 46 y.o. female patient admitted with medication non-compliance from [place] [facility]. In [place]   HPI:  Patient is not cooperative with assessment, intermittently yelling obscenities and then calm, laying down. Her UDS is positive for cannabinoids only. Given patient's condition, diagnoses, and collateral at presentation that she is off her medications, patient will need hospitalization for stabilization.   Past Psychiatric History: bipolar vs schizoaffective disorder; chronic substance abuse;   Risk to Self:   Risk to Others:   Prior Inpatient Therapy:   Prior Outpatient Therapy:    Past Medical History:  Past Medical History:  Diagnosis Date   Allergy    Seasonal, Ultram (seizures)   Anemia 2005   Bipolar 1 disorder ([place])    Glaucoma    Seizures ([place]) 2008    Past Surgical History:  Procedure Laterality Date   BREAST EXCISIONAL BIOPSY Right    x 2   CESAREAN SECTION     x 2   LEEP  2000   Family History:  Family History  Problem Relation Age of Onset   Hypertension Mother    Hyperlipidemia Mother    Asthma Mother    Parkinson's disease Father    Lung cancer Maternal Grandmother    Other Paternal Grandfather 90       Cardiac arrest   Heart disease Paternal Grandfather    Tourette syndrome Daughter    Family Psychiatric  History: unknown Social History:  Social History   Substance and Sexual Activity  Alcohol Use Not Currently   Alcohol/week: 8.0 standard drinks of alcohol    Types: 8 Shots of liquor per week   Comment: last use 03/30/22 4x/yr     Social History   Substance and Sexual Activity  Drug Use Yes   Frequency: 1.0 times per week   Types: Marijuana, Cocaine, Heroin, Methamphetamines, MDMA (Ecstacy)   Comment: last use yesterday    Social History   Socioeconomic History   Marital status: Legally Separated    Spouse name: Not on file   Number of children: Not on file   Years of education: Not on file   Highest education level: Not on file  Occupational History   Not on file  Tobacco Use   Smoking status: Every Day    Packs/day: 0.50    Years: 9.00    Total pack years: 4.50    Types: E-cigarettes, Cigarettes, Cigars   Smokeless tobacco: Former    Types: Nurse, children's Use: Some days   Substances: Nicotine  Substance and Sexual Activity   Alcohol use: Not Currently    Alcohol/week: 8.0 standard drinks of alcohol    Types: 8 Shots of liquor per week    Comment: last use 03/30/22 4x/yr   Drug use: Yes    Frequency: 1.0 times per week    Types: Marijuana, Cocaine, Heroin, Methamphetamines, MDMA (Ecstacy)    Comment: last use yesterday   Sexual activity: Yes    Partners: Male    Birth control/protection: None  Other Topics Concern   Not on  file  Social History Narrative   Not on file   Social Determinants of Health   Financial Resource Strain: Not on file  Food Insecurity: Not on file  Transportation Needs: Not on file  Physical Activity: Not on file  Stress: Not on file  Social Connections: Not on file   Additional Social History:    Allergies:   Allergies  Allergen Reactions   Haldol [Haloperidol Lactate]     Patient states that she gets stiff muscles when taking Haldol   Ultram [Tramadol] Other (See Comments)    Seizures   Ziprasidone Hcl     Other reaction(s): Other (See Comments) PER PT WHEN SHE TAKES THIS SHE CANT MOVE HER NECK    Labs:  Results for orders placed or performed during the hospital  encounter of 05/20/22 (from the past 48 hour(s))  Urine Drug Screen, Qualitative     Status: Abnormal   Collection Time: 05/20/22 11:43 AM  Result Value Ref Range   Tricyclic, Ur Screen NONE DETECTED NONE DETECTED   Amphetamines, Ur Screen NONE DETECTED NONE DETECTED   MDMA (Ecstasy)Ur Screen NONE DETECTED NONE DETECTED   Cocaine Metabolite,Ur [place] NONE DETECTED NONE DETECTED   Opiate, Ur Screen NONE DETECTED NONE DETECTED   Phencyclidine (PCP) Ur S NONE DETECTED NONE DETECTED   Cannabinoid 50 Ng, Ur [place] POSITIVE (A) NONE DETECTED   Barbiturates, Ur Screen NONE DETECTED NONE DETECTED   Benzodiazepine, Ur Scrn NONE DETECTED NONE DETECTED   Methadone Scn, Ur NONE DETECTED NONE DETECTED    Comment: (NOTE) Tricyclics + metabolites, urine    Cutoff 1000 ng/mL Amphetamines + metabolites, urine  Cutoff 1000 ng/mL MDMA (Ecstasy), urine              Cutoff 500 ng/mL Cocaine Metabolite, urine          Cutoff 300 ng/mL Opiate + metabolites, urine        Cutoff 300 ng/mL Phencyclidine (PCP), urine         Cutoff 25 ng/mL Cannabinoid, urine                 Cutoff 50 ng/mL Barbiturates + metabolites, urine  Cutoff 200 ng/mL Benzodiazepine, urine              Cutoff 200 ng/mL Methadone, urine                   Cutoff 300 ng/mL  The urine drug screen provides only a preliminary, unconfirmed analytical test result and should not be used for non-medical purposes. Clinical consideration and professional judgment should be applied to any positive drug screen result due to possible interfering substances. A more specific alternate chemical method must be used in order to obtain a confirmed analytical result. Gas chromatography / mass spectrometry (GC/MS) is the preferred confirm atory method. Performed at [facility], [address] [place], [place] [address]   Comprehensive metabolic panel     Status: Abnormal   Collection Time: 05/20/22 11:44 AM  Result Value Ref Range   Sodium 139 135  - 145 mmol/L   Potassium 3.5 3.5 - 5.1 mmol/L   Chloride 108 98 - 111 mmol/L   CO2 23 22 - 32 mmol/L   Glucose, Bld 109 (H) 70 - 99 mg/dL    Comment: Glucose reference range applies only to samples taken after fasting for at least 8 hours.   BUN 12 6 - 20 mg/dL   Creatinine, Ser 0.69 0.44 - 1.00 mg/dL  Calcium 9.0 8.9 - 10.3 mg/dL   Total Protein 6.9 6.5 - 8.1 g/dL   Albumin 4.1 3.5 - 5.0 g/dL   AST 9 (L) 15 - 41 U/L   ALT 17 0 - 44 U/L   Alkaline Phosphatase 75 38 - 126 U/L   Total Bilirubin 0.5 0.3 - 1.2 mg/dL   GFR, Estimated >60 >60 mL/min    Comment: (NOTE) Calculated using the CKD-EPI Creatinine Equation (2021)    Anion gap 8 5 - 15    Comment: Performed at [facility], [address], [place], [place] [address]  Ethanol     Status: None   Collection Time: 05/20/22 11:44 AM  Result Value Ref Range   Alcohol, Ethyl (B) <10 <10 mg/dL    Comment: (NOTE) Lowest detectable limit for serum alcohol is 10 mg/dL.  For medical purposes only. Performed at [facility], [address] [place], [place] [address]   Salicylate level     Status: Abnormal   Collection Time: 05/20/22 11:44 AM  Result Value Ref Range   Salicylate Lvl <4.7 (L) 7.0 - 30.0 mg/dL    Comment: Performed at [facility], [address] [place], [place] [address]  Acetaminophen level     Status: Abnormal   Collection Time: 05/20/22 11:44 AM  Result Value Ref Range   Acetaminophen (Tylenol), Serum <10 (L) 10 - 30 ug/mL    Comment: (NOTE) Therapeutic concentrations vary significantly. A range of 10-30 ug/mL  may be an effective concentration for many patients. However, some  are best treated at concentrations outside of this range. Acetaminophen concentrations >150 ug/mL at 4 hours after ingestion  and >50 ug/mL at 12 hours after ingestion are often associated with  toxic reactions.  Performed at [facility], [address] [place], [place] [address]   cbc      Status: Abnormal   Collection Time: 05/20/22 11:44 AM  Result Value Ref Range   WBC 15.3 (H) 4.0 - 10.5 K/uL   RBC 4.49 3.87 - 5.11 MIL/uL   Hemoglobin 13.3 12.0 - 15.0 g/dL   HCT 41.6 36.0 - 46.0 %   MCV 92.7 80.0 - 100.0 fL   MCH 29.6 26.0 - 34.0 pg   MCHC 32.0 30.0 - 36.0 g/dL   RDW 12.5 11.5 - 15.5 %   Platelets 353 150 - 400 K/uL   nRBC 0.0 0.0 - 0.2 %    Comment: Performed at [facility], [address] [place], [place] [address]  Valproic acid level     Status: Abnormal   Collection Time: 05/20/22 12:56 PM  Result Value Ref Range   Valproic Acid Lvl <10 (L) 50.0 - 100.0 ug/mL    Comment: Performed at [facility], [address] [place], [place] [address]  Lithium level     Status: Abnormal   Collection Time: 05/20/22 12:57 PM  Result Value Ref Range   Lithium Lvl <0.06 (L) 0.60 - 1.20 mmol/L    Comment: Performed at [facility], [address], [place], [place] [address]    Current Facility-Administered Medications  Medication Dose Route Frequency Provider Last Rate Last Admin   acetaminophen (TYLENOL) tablet 650 mg  650 mg Oral Q4H PRN Duffy Bruce, MD       ARIPiprazole (ABILIFY) tablet 15 mg  15 mg Oral Daily Duffy Bruce, MD   15 mg at 05/20/22 1424   diphenhydrAMINE (BENADRYL) injection 50 mg  50 mg Intramuscular Once Archie Balboa,  Carter Kitten, MD       divalproex (DEPAKOTE) DR tablet 500 mg  500 mg Oral Q12H Duffy Bruce, MD   500 mg at 05/20/22 1424   lithium carbonate (LITHOBID) CR tablet 600 mg  600 mg Oral Q12H Duffy Bruce, MD   600 mg at 05/20/22 1424   LORazepam (ATIVAN) injection 2 mg  2 mg Intramuscular Once Nance Pear, MD       nicotine (NICODERM CQ - dosed in mg/24 hours) patch 21 mg  21 mg Transdermal Daily Duffy Bruce, MD       QUEtiapine (SEROQUEL) tablet 200 mg  200 mg Oral QHS Duffy Bruce, MD       ziprasidone (GEODON) injection 20 mg  20 mg Intramuscular Once Nance Pear, MD       Current  Outpatient Medications  Medication Sig Dispense Refill   ARIPiprazole (ABILIFY) 15 MG tablet Take 1 tablet (15 mg total) by mouth daily. 30 tablet 2   Buprenorphine HCl-Naloxone HCl 8-2 MG FILM Place 2 Film under the tongue daily.     divalproex (DEPAKOTE) 250 MG DR tablet Take 2 tablets (500 mg total) by mouth every 12 (twelve) hours. 180 tablet 1   lithium carbonate (LITHOBID) 300 MG CR tablet Take 2 tablets (600 mg total) by mouth every 12 (twelve) hours. 120 tablet 1   naproxen (NAPROSYN) 500 MG tablet Take 1 tablet (500 mg total) by mouth 2 (two) times daily with a meal. (Patient not taking: Reported on 04/22/2022) 20 tablet 2   nicotine (NICODERM CQ - DOSED IN MG/24 HOURS) 21 mg/24hr patch Place 1 patch (21 mg total) onto the skin daily. 28 patch 1   norethindrone (MICRONOR) 0.35 MG tablet Take 1 tablet (0.35 mg total) by mouth daily. 28 tablet 13   penicillin v potassium (VEETID) 250 MG tablet Take 1 tablet (250 mg total) by mouth 4 (four) times daily. 28 tablet 0   QUEtiapine (SEROQUEL) 200 MG tablet Take 1 tablet (200 mg total) by mouth at bedtime. 30 tablet 2   QUEtiapine (SEROQUEL) 400 MG tablet Take 1 tablet (400 mg total) by mouth at bedtime. (Patient not taking: Reported on 04/01/2022) 30 tablet 1    Musculoskeletal: Strength & Muscle Tone: within normal limits Gait & Station:  did not assess Patient leans: N/A   Psychiatric Specialty Exam:  Presentation  General Appearance: Bizarre; Disheveled  Eye Contact:Minimal  Speech:Clear and Coherent  Speech Volume:Increased  Handedness:Right   Mood and Affect  Mood:Dysphoric; Irritable  Affect:Congruent   Thought Process  Thought Processes:Disorganized  Descriptions of Associations:Loose  Orientation:-- (UTA)  Thought Content:Illogical  History of Schizophrenia/Schizoaffective disorder:No  Duration of Psychotic Symptoms:Greater than six months  Hallucinations:Hallucinations: -- (UTA)  Ideas of  Reference:Other (comment) (May be delusional, yelling nonsensical things)  Suicidal Thoughts:Suicidal Thoughts: -- (UT)  Homicidal Thoughts:Homicidal Thoughts: -- Pincus Badder)   Sensorium  Memory:Immediate Poor  Judgment:Impaired  Insight:Lacking   Executive Functions  Concentration:Poor  Attention Span:Poor  Recall:Poor  Fund of Knowledge:Poor  Language:Poor   Psychomotor Activity  Psychomotor Activity:Psychomotor Activity: Normal   Assets  Assets:Resilience; Physical Health; Social Support; Catering manager   Sleep  Sleep:Sleep: Poor   Physical Exam: Physical Exam Vitals and nursing note reviewed.  HENT:     Head: Normocephalic.     Nose: No congestion or rhinorrhea.  Eyes:     General:        Right eye: No discharge.        Left eye: No discharge.  Cardiovascular:  Rate and Rhythm: Normal rate.  Pulmonary:     Effort: Pulmonary effort is normal.  Musculoskeletal:        General: Normal range of motion.     Cervical back: Normal range of motion.  Skin:    General: Skin is dry.  Neurological:     Mental Status: She is alert.  Psychiatric:        Attention and Perception: She is inattentive.        Mood and Affect: Affect is angry and inappropriate.        Behavior: Behavior is uncooperative.        Cognition and Memory: Cognition is impaired.        Judgment: Judgment is impulsive.     Comments: Unable to fully assess. Apparent that she requires psych admission    Review of Systems  Respiratory: Negative.     Blood pressure 105/62, pulse 93, temperature 98.5 F (36.9 C), temperature source Oral, resp. rate 18, height '5\' 3"'$  (1.6 m), last menstrual period 04/18/2022, SpO2 97 %. Body mass index is 18.6 kg/m.  Treatment Plan Summary: Medication management and Plan admit to inpatient psychiatry  Disposition: Recommend psychiatric Inpatient admission when medically cleared.  Sherlon Handing, NP 05/20/2022 4:07 PM

## 2022-05-21 LAB — CBG MONITORING, ED
Glucose-Capillary: 88 mg/dL (ref 70–99)
Glucose-Capillary: 91 mg/dL (ref 70–99)

## 2022-05-21 LAB — RESP PANEL BY RT-PCR (FLU A&B, COVID) ARPGX2
Influenza A by PCR: NEGATIVE
Influenza B by PCR: NEGATIVE
SARS Coronavirus 2 by RT PCR: NEGATIVE

## 2022-05-21 MED ORDER — ARIPIPRAZOLE 15 MG PO TABS: 15.0000 mg | ORAL_TABLET | Freq: Every day | ORAL | 0 refills | Status: DC

## 2022-05-21 MED ORDER — DIVALPROEX SODIUM 250 MG PO DR TAB: 500.0000 mg | DELAYED_RELEASE_TABLET | Freq: Two times a day (BID) | ORAL | 0 refills | Status: DC

## 2022-05-21 MED ORDER — LITHIUM CARBONATE ER 300 MG PO TBCR: 600.0000 mg | EXTENDED_RELEASE_TABLET | Freq: Two times a day (BID) | ORAL | 0 refills | Status: DC

## 2022-05-21 MED ORDER — QUETIAPINE FUMARATE 200 MG PO TABS: 200.0000 mg | ORAL_TABLET | Freq: Every day | ORAL | 0 refills | Status: DC

## 2022-05-21 NOTE — ED Provider Notes (Signed)
Emergency Medicine Observation Re-evaluation Note  Autumn [name] is a 46 y.o. female, seen on rounds today.  Pt initially presented to the ED for complaints of Psychiatric Evaluation Currently, the patient is resting, voices no medical complaints.  Physical Exam  BP 90/65 (BP Location: Left Arm)   Pulse 66   Temp 98.5 F (36.9 C) (Oral)   Resp 18   Ht '5\' 3"'$  (1.6 m)   LMP 04/18/2022 (Exact Date)   SpO2 98%   BMI 18.60 kg/m  Physical Exam General: Resting in no acute distress Cardiac: No cyanosis Lungs: Equal rise and fall Psych: Not agitated  ED Course / MDM  EKG:   I have reviewed the labs performed to date as well as medications administered while in observation.  Recent changes in the last 24 hours include no events overnight.  Plan  Current plan is for psychiatric disposition. Autumn [name] is under involuntary commitment.      Paulette Blanch, MD 05/21/22 [phone]

## 2022-05-21 NOTE — ED Provider Notes (Signed)
Procedures     ----------------------------------------- 3:18 PM on 05/21/2022 ----------------------------------------- Discussed with psychiatry team who has rescinded IVC after reevaluation today.  Psychiatrically stable for discharge.       Carrie Mew, MD 05/21/22 [phone]

## 2022-05-21 NOTE — ED Notes (Signed)
Pt discharge papers explained to pt, no other questions expressed at this time, discharge signature placed in medical records bin

## 2022-05-21 NOTE — Consult Note (Signed)
[facility] Consult   Reason for Consult:  mania with aggression Referring Physician:  EDP Patient Identification: Autumn [name] MRN:  [id number] Principal Diagnosis: Bipolar affective disorder, current episode manic ([place]) Diagnosis:  Principal Problem:   Bipolar affective disorder, current episode manic ([place]) Active Problems:   Cannabis use disorder, severe, dependence ([place])   Total Time spent with patient: 45 minutes  Subjective:   Autumn [name] is a 46 y.o. female patient admitted with mania with aggression.  HPI:  45 yo female presents after being discharged from a shelter and stopped taking her medications.  Agitation medications provided along with her regular medications.  Calm and cooperative, no threats to self or other.  Denies suicidal/homicidal ideations, hallucinations, or any recent substance abuse.  She wants to discharge and call her mother to let her know.    Past Psychiatric History: bipolar d/o  Risk to Self:  none Risk to Others:  none Prior Inpatient Therapy:  multiple times Prior Outpatient Therapy:  yes, [facility]  Past Medical History:  Past Medical History:  Diagnosis Date   Allergy    Seasonal, Ultram (seizures)   Anemia 2005   Bipolar 1 disorder ([place])    Glaucoma    Seizures ([place]) 2008    Past Surgical History:  Procedure Laterality Date   BREAST EXCISIONAL BIOPSY Right    x 2   CESAREAN SECTION     x 2   LEEP  2000   Family History:  Family History  Problem Relation Age of Onset   Hypertension Mother    Hyperlipidemia Mother    Asthma Mother    Parkinson's disease Father    Lung cancer Maternal Grandmother    Other Paternal Grandfather 31       Cardiac arrest   Heart disease Paternal Grandfather    Tourette syndrome Daughter    Family Psychiatric  History: see above Social History:  Social History   Substance and Sexual Activity  Alcohol Use Not Currently   Alcohol/week: 8.0 standard drinks of alcohol   Types: 8  Shots of liquor per week   Comment: last use 03/30/22 4x/yr     Social History   Substance and Sexual Activity  Drug Use Yes   Frequency: 1.0 times per week   Types: Marijuana, Cocaine, Heroin, Methamphetamines, MDMA (Ecstacy)   Comment: last use yesterday    Social History   Socioeconomic History   Marital status: Legally Separated    Spouse name: Not on file   Number of children: Not on file   Years of education: Not on file   Highest education level: Not on file  Occupational History   Not on file  Tobacco Use   Smoking status: Every Day    Packs/day: 0.50    Years: 9.00    Total pack years: 4.50    Types: E-cigarettes, Cigarettes, Cigars   Smokeless tobacco: Former    Types: Nurse, children's Use: Some days   Substances: Nicotine  Substance and Sexual Activity   Alcohol use: Not Currently    Alcohol/week: 8.0 standard drinks of alcohol    Types: 8 Shots of liquor per week    Comment: last use 03/30/22 4x/yr   Drug use: Yes    Frequency: 1.0 times per week    Types: Marijuana, Cocaine, Heroin, Methamphetamines, MDMA (Ecstacy)    Comment: last use yesterday   Sexual activity: Yes    Partners: Male    Birth control/protection: None  Other Topics Concern   Not on file  Social History Narrative   Not on file   Social Determinants of Health   Financial Resource Strain: Not on file  Food Insecurity: Not on file  Transportation Needs: Not on file  Physical Activity: Not on file  Stress: Not on file  Social Connections: Not on file   Additional Social History:    Allergies:   Allergies  Allergen Reactions   Haldol [Haloperidol Lactate]     Patient states that she gets stiff muscles when taking Haldol   Ultram [Tramadol] Other (See Comments)    Seizures   Ziprasidone Hcl     Other reaction(s): Other (See Comments) PER PT WHEN SHE TAKES THIS SHE CANT MOVE HER NECK    Labs:  Results for orders placed or performed during the hospital encounter of  05/20/22 (from the past 48 hour(s))  Urine Drug Screen, Qualitative     Status: Abnormal   Collection Time: 05/20/22 11:43 AM  Result Value Ref Range   Tricyclic, Ur Screen NONE DETECTED NONE DETECTED   Amphetamines, Ur Screen NONE DETECTED NONE DETECTED   MDMA (Ecstasy)Ur Screen NONE DETECTED NONE DETECTED   Cocaine Metabolite,Ur [place] NONE DETECTED NONE DETECTED   Opiate, Ur Screen NONE DETECTED NONE DETECTED   Phencyclidine (PCP) Ur S NONE DETECTED NONE DETECTED   Cannabinoid 50 Ng, Ur [place] POSITIVE (A) NONE DETECTED   Barbiturates, Ur Screen NONE DETECTED NONE DETECTED   Benzodiazepine, Ur Scrn NONE DETECTED NONE DETECTED   Methadone Scn, Ur NONE DETECTED NONE DETECTED    Comment: (NOTE) Tricyclics + metabolites, urine    Cutoff 1000 ng/mL Amphetamines + metabolites, urine  Cutoff 1000 ng/mL MDMA (Ecstasy), urine              Cutoff 500 ng/mL Cocaine Metabolite, urine          Cutoff 300 ng/mL Opiate + metabolites, urine        Cutoff 300 ng/mL Phencyclidine (PCP), urine         Cutoff 25 ng/mL Cannabinoid, urine                 Cutoff 50 ng/mL Barbiturates + metabolites, urine  Cutoff 200 ng/mL Benzodiazepine, urine              Cutoff 200 ng/mL Methadone, urine                   Cutoff 300 ng/mL  The urine drug screen provides only a preliminary, unconfirmed analytical test result and should not be used for non-medical purposes. Clinical consideration and professional judgment should be applied to any positive drug screen result due to possible interfering substances. A more specific alternate chemical method must be used in order to obtain a confirmed analytical result. Gas chromatography / mass spectrometry (GC/MS) is the preferred confirm atory method. Performed at [facility], [address] [place], [place] [address]   Comprehensive metabolic panel     Status: Abnormal   Collection Time: 05/20/22 11:44 AM  Result Value Ref Range   Sodium 139 135 - 145 mmol/L    Potassium 3.5 3.5 - 5.1 mmol/L   Chloride 108 98 - 111 mmol/L   CO2 23 22 - 32 mmol/L   Glucose, Bld 109 (H) 70 - 99 mg/dL    Comment: Glucose reference range applies only to samples taken after fasting for at least 8 hours.   BUN 12 6 - 20 mg/dL   Creatinine,  Ser 0.69 0.44 - 1.00 mg/dL   Calcium 9.0 8.9 - 10.3 mg/dL   Total Protein 6.9 6.5 - 8.1 g/dL   Albumin 4.1 3.5 - 5.0 g/dL   AST 9 (L) 15 - 41 U/L   ALT 17 0 - 44 U/L   Alkaline Phosphatase 75 38 - 126 U/L   Total Bilirubin 0.5 0.3 - 1.2 mg/dL   GFR, Estimated >60 >60 mL/min    Comment: (NOTE) Calculated using the CKD-EPI Creatinine Equation (2021)    Anion gap 8 5 - 15    Comment: Performed at [facility], [address], [place], [place] [address]  Ethanol     Status: None   Collection Time: 05/20/22 11:44 AM  Result Value Ref Range   Alcohol, Ethyl (B) <10 <10 mg/dL    Comment: (NOTE) Lowest detectable limit for serum alcohol is 10 mg/dL.  For medical purposes only. Performed at [facility], [address] [place], [place] [address]   Salicylate level     Status: Abnormal   Collection Time: 05/20/22 11:44 AM  Result Value Ref Range   Salicylate Lvl <6.0 (L) 7.0 - 30.0 mg/dL    Comment: Performed at [facility], [address] [place], [place] [address]  Acetaminophen level     Status: Abnormal   Collection Time: 05/20/22 11:44 AM  Result Value Ref Range   Acetaminophen (Tylenol), Serum <10 (L) 10 - 30 ug/mL    Comment: (NOTE) Therapeutic concentrations vary significantly. A range of 10-30 ug/mL  may be an effective concentration for many patients. However, some  are best treated at concentrations outside of this range. Acetaminophen concentrations >150 ug/mL at 4 hours after ingestion  and >50 ug/mL at 12 hours after ingestion are often associated with  toxic reactions.  Performed at [facility], [address] [place], [place] [address]   cbc     Status:  Abnormal   Collection Time: 05/20/22 11:44 AM  Result Value Ref Range   WBC 15.3 (H) 4.0 - 10.5 K/uL   RBC 4.49 3.87 - 5.11 MIL/uL   Hemoglobin 13.3 12.0 - 15.0 g/dL   HCT 41.6 36.0 - 46.0 %   MCV 92.7 80.0 - 100.0 fL   MCH 29.6 26.0 - 34.0 pg   MCHC 32.0 30.0 - 36.0 g/dL   RDW 12.5 11.5 - 15.5 %   Platelets 353 150 - 400 K/uL   nRBC 0.0 0.0 - 0.2 %    Comment: Performed at [facility], [address], [place], [place] [address]  Valproic acid level     Status: Abnormal   Collection Time: 05/20/22 12:56 PM  Result Value Ref Range   Valproic Acid Lvl <10 (L) 50.0 - 100.0 ug/mL    Comment: Performed at [facility], [address] [place], [place] [address]  Lithium level     Status: Abnormal   Collection Time: 05/20/22 12:57 PM  Result Value Ref Range   Lithium Lvl <0.06 (L) 0.60 - 1.20 mmol/L    Comment: Performed at [facility], [address] [place], [place] [address]  CBG monitoring, ED     Status: None   Collection Time: 05/21/22  6:05 AM  Result Value Ref Range   Glucose-Capillary 88 70 - 99 mg/dL    Comment: Glucose reference range applies only to samples taken after fasting for at least 8 hours.  Resp Panel by RT-PCR (Flu A&B, Covid) Anterior Nasal Swab  Status: None   Collection Time: 05/21/22  8:50 AM   Specimen: Anterior Nasal Swab  Result Value Ref Range   SARS Coronavirus 2 by RT PCR NEGATIVE NEGATIVE    Comment: (NOTE) SARS-CoV-2 target nucleic acids are NOT DETECTED.  The SARS-CoV-2 RNA is generally detectable in upper respiratory specimens during the acute phase of infection. The lowest concentration of SARS-CoV-2 viral copies this assay can detect is 138 copies/mL. A negative result does not preclude SARS-Cov-2 infection and should not be used as the sole basis for treatment or other patient management decisions. A negative result may occur with  improper specimen collection/handling, submission of specimen other than  nasopharyngeal swab, presence of viral mutation(s) within the areas targeted by this assay, and inadequate number of viral copies(<138 copies/mL). A negative result must be combined with clinical observations, patient history, and epidemiological information. The expected result is Negative.  Fact Sheet for Patients:  EntrepreneurPulse.com.au  Fact Sheet for Healthcare Providers:  IncredibleEmployment.be  This test is no t yet approved or cleared by the [place] FDA and  has been authorized for detection and/or diagnosis of SARS-CoV-2 by FDA under an Emergency Use Authorization (EUA). This EUA will remain  in effect (meaning this test can be used) for the duration of the COVID-19 declaration under Section 564(b)(1) of the Act, 21 U.S.C.section 360bbb-3(b)(1), unless the authorization is terminated  or revoked sooner.       Influenza A by PCR NEGATIVE NEGATIVE   Influenza B by PCR NEGATIVE NEGATIVE    Comment: (NOTE) The Xpert Xpress SARS-CoV-2/FLU/RSV plus assay is intended as an aid in the diagnosis of influenza from Nasopharyngeal swab specimens and should not be used as a sole basis for treatment. Nasal washings and aspirates are unacceptable for Xpert Xpress SARS-CoV-2/FLU/RSV testing.  Fact Sheet for Patients: EntrepreneurPulse.com.au  Fact Sheet for Healthcare Providers: IncredibleEmployment.be  This test is not yet approved or cleared by the [place] FDA and has been authorized for detection and/or diagnosis of SARS-CoV-2 by FDA under an Emergency Use Authorization (EUA). This EUA will remain in effect (meaning this test can be used) for the duration of the COVID-19 declaration under Section 564(b)(1) of the Act, 21 U.S.C. section 360bbb-3(b)(1), unless the authorization is terminated or revoked.  Performed at [facility], [address] [place],  [address]   CBG  monitoring, ED     Status: None   Collection Time: 05/21/22 12:23 PM  Result Value Ref Range   Glucose-Capillary 91 70 - 99 mg/dL    Comment: Glucose reference range applies only to samples taken after fasting for at least 8 hours.    Current Facility-Administered Medications  Medication Dose Route Frequency Provider Last Rate Last Admin   acetaminophen (TYLENOL) tablet 650 mg  650 mg Oral Q4H PRN Duffy Bruce, MD       ARIPiprazole (ABILIFY) tablet 15 mg  15 mg Oral Daily Duffy Bruce, MD   15 mg at 05/21/22 0924   divalproex (DEPAKOTE) DR tablet 500 mg  500 mg Oral Q12H Duffy Bruce, MD   500 mg at 05/21/22 0924   lithium carbonate (LITHOBID) CR tablet 600 mg  600 mg Oral Q12H Duffy Bruce, MD   600 mg at 05/21/22 7035   nicotine (NICODERM CQ - dosed in mg/24 hours) patch 21 mg  21 mg Transdermal Daily Duffy Bruce, MD   21 mg at 05/21/22 0923   QUEtiapine (SEROQUEL) tablet 200 mg  200 mg Oral QHS Duffy Bruce, MD  Current Outpatient Medications  Medication Sig Dispense Refill   ARIPiprazole (ABILIFY) 15 MG tablet Take 1 tablet (15 mg total) by mouth daily. 30 tablet 2   Buprenorphine HCl-Naloxone HCl 8-2 MG FILM Place 2 Film under the tongue daily. (Patient not taking: Reported on 05/20/2022)     divalproex (DEPAKOTE) 250 MG DR tablet Take 2 tablets (500 mg total) by mouth every 12 (twelve) hours. (Patient not taking: Reported on 05/20/2022) 180 tablet 1   lithium carbonate (LITHOBID) 300 MG CR tablet Take 2 tablets (600 mg total) by mouth every 12 (twelve) hours. (Patient not taking: Reported on 05/20/2022) 120 tablet 1   naproxen (NAPROSYN) 500 MG tablet Take 1 tablet (500 mg total) by mouth 2 (two) times daily with a meal. (Patient not taking: Reported on 04/22/2022) 20 tablet 2   nicotine (NICODERM CQ - DOSED IN MG/24 HOURS) 21 mg/24hr patch Place 1 patch (21 mg total) onto the skin daily. (Patient not taking: Reported on 05/20/2022) 28 patch 1   norethindrone  (MICRONOR) 0.35 MG tablet Take 1 tablet (0.35 mg total) by mouth daily. (Patient not taking: Reported on 05/20/2022) 28 tablet 13   penicillin v potassium (VEETID) 250 MG tablet Take 1 tablet (250 mg total) by mouth 4 (four) times daily. (Patient not taking: Reported on 05/20/2022) 28 tablet 0   QUEtiapine (SEROQUEL) 200 MG tablet Take 1 tablet (200 mg total) by mouth at bedtime. (Patient not taking: Reported on 05/20/2022) 30 tablet 2    Musculoskeletal: Strength & Muscle Tone: within normal limits Gait & Station: normal Patient leans: N/A  Psychiatric Specialty Exam: Physical Exam Vitals and nursing note reviewed.  Constitutional:      Appearance: Normal appearance.  HENT:     Head: Normocephalic.     Nose: Nose normal.  Pulmonary:     Effort: Pulmonary effort is normal.  Musculoskeletal:        General: Normal range of motion.     Cervical back: Normal range of motion.  Neurological:     General: No focal deficit present.     Mental Status: She is alert and oriented to person, place, and time.  Psychiatric:        Attention and Perception: Attention and perception normal.        Mood and Affect: Mood is anxious.        Speech: Speech normal.        Behavior: Behavior normal. Behavior is cooperative.        Thought Content: Thought content normal.        Cognition and Memory: Cognition and memory normal.        Judgment: Judgment normal.     Review of Systems  Psychiatric/Behavioral:  The patient is nervous/anxious.   All other systems reviewed and are negative.   Blood pressure 98/64, pulse 60, temperature 98 F (36.7 C), temperature source Oral, resp. rate 16, height '5\' 3"'$  (1.6 m), last menstrual period 04/18/2022, SpO2 98 %.Body mass index is 18.6 kg/m.  General Appearance: Casual  Eye Contact:  Good  Speech:  Normal Rate  Volume:  Normal  Mood:  Anxious  Affect:  Congruent  Thought Process:  Coherent and Descriptions of Associations: Intact  Orientation:  Full  (Time, Place, and Person)  Thought Content:  WDL and Logical  Suicidal Thoughts:  No  Homicidal Thoughts:  No  Memory:  Immediate;   Good Recent;   Good Remote;   Good  Judgement:  Fair  Insight:  Fair  Psychomotor Activity:  Normal  Concentration:  Concentration: Good and Attention Span: Good  Recall:  Good  Fund of Knowledge:  Good  Language:  Good  Akathisia:  No  Handed:  Right  AIMS (if indicated):     Assets:  Leisure Time Physical Health Resilience Social Support  ADL's:  Intact  Cognition:  WNL  Sleep:         Physical Exam: Physical Exam Vitals and nursing note reviewed.  Constitutional:      Appearance: Normal appearance.  HENT:     Head: Normocephalic.     Nose: Nose normal.  Pulmonary:     Effort: Pulmonary effort is normal.  Musculoskeletal:        General: Normal range of motion.     Cervical back: Normal range of motion.  Neurological:     General: No focal deficit present.     Mental Status: She is alert and oriented to person, place, and time.  Psychiatric:        Attention and Perception: Attention and perception normal.        Mood and Affect: Mood is anxious.        Speech: Speech normal.        Behavior: Behavior normal. Behavior is cooperative.        Thought Content: Thought content normal.        Cognition and Memory: Cognition and memory normal.        Judgment: Judgment normal.    Review of Systems  Psychiatric/Behavioral:  The patient is nervous/anxious.   All other systems reviewed and are negative.  Blood pressure 98/64, pulse 60, temperature 98 F (36.7 C), temperature source Oral, resp. rate 16, height '5\' 3"'$  (1.6 m), last menstrual period 04/18/2022, SpO2 98 %. Body mass index is 18.6 kg/m.  Treatment Plan Summary: Bipolar affective disorder, mania: Lithium 600 mg BID Depakote 500 mg BID Abilify 15 mg daily Seroquel 200 mg at bedtime  Disposition: No evidence of imminent risk to self or others at present.   Patient  does not meet criteria for psychiatric inpatient admission.  Waylan Boga, NP 05/21/2022 2:29 PM

## 2022-05-21 NOTE — ED Notes (Signed)
Pt asked me to check her sugar. She advised "I am borderline diabetic and I feel it is dropping and need a soda". MD made aware. This RN checked BGL and was 91

## 2022-05-21 NOTE — ED Notes (Signed)
IVC/pending inpatient psych admit

## 2022-05-24 ENCOUNTER — Other Ambulatory Visit: Payer: Self-pay

## 2022-05-24 ENCOUNTER — Emergency Department
Admission: EM | Admit: 2022-05-24 | Discharge: 2022-05-24 | Disposition: A | Discharge status: Home / Self Care | Payer: Medicaid Other | Attending: Emergency Medicine | Admitting: Emergency Medicine

## 2022-05-24 DIAGNOSIS — F1410 Cocaine abuse, uncomplicated: Secondary | ICD-10-CM | POA: Insufficient documentation

## 2022-05-24 DIAGNOSIS — F1010 Alcohol abuse, uncomplicated: Secondary | ICD-10-CM | POA: Insufficient documentation

## 2022-05-24 DIAGNOSIS — F1510 Other stimulant abuse, uncomplicated: Secondary | ICD-10-CM | POA: Insufficient documentation

## 2022-05-24 DIAGNOSIS — Y900 Blood alcohol level of less than 20 mg/100 ml: Secondary | ICD-10-CM | POA: Insufficient documentation

## 2022-05-24 DIAGNOSIS — F1910 Other psychoactive substance abuse, uncomplicated: Secondary | ICD-10-CM

## 2022-05-24 DIAGNOSIS — D72829 Elevated white blood cell count, unspecified: Secondary | ICD-10-CM | POA: Insufficient documentation

## 2022-05-24 LAB — COMPREHENSIVE METABOLIC PANEL
ALT: 16 U/L (ref 0–44)
AST: 9 U/L — ABNORMAL LOW (ref 15–41)
Albumin: 3.8 g/dL (ref 3.5–5.0)
Alkaline Phosphatase: 72 U/L (ref 38–126)
Anion gap: 7 (ref 5–15)
BUN: 21 mg/dL — ABNORMAL HIGH (ref 6–20)
CO2: 25 mmol/L (ref 22–32)
Calcium: 9.3 mg/dL (ref 8.9–10.3)
Chloride: 106 mmol/L (ref 98–111)
Creatinine, Ser: 0.64 mg/dL (ref 0.44–1.00)
GFR, Estimated: >60 mL/min (ref >60)
Glucose, Bld: 110 mg/dL — ABNORMAL HIGH (ref 70–99)
Potassium: 4 mmol/L (ref 3.5–5.1)
Sodium: 138 mmol/L (ref 135–145)
Total Bilirubin: 0.4 mg/dL (ref 0.3–1.2)
Total Protein: 6.7 g/dL (ref 6.5–8.1)

## 2022-05-24 LAB — URINALYSIS, ROUTINE W REFLEX MICROSCOPIC
Bacteria, UA: NONE SEEN
Bilirubin Urine: NEGATIVE
Glucose, UA: NEGATIVE mg/dL
Hgb urine dipstick: NEGATIVE
Ketones, ur: NEGATIVE mg/dL
Nitrite: NEGATIVE
Protein, ur: NEGATIVE mg/dL
Specific Gravity, Urine: 1.027 (ref 1.005–1.030)
pH: 5 (ref 5.0–8.0)

## 2022-05-24 LAB — CBC WITH DIFFERENTIAL/PLATELET
Abs Immature Granulocytes: 0.05 10*3/uL (ref 0.00–0.07)
Basophils Absolute: 0 10*3/uL (ref 0.0–0.1)
Basophils Relative: 0 %
Eosinophils Absolute: 0.3 10*3/uL (ref 0.0–0.5)
Eosinophils Relative: 2 %
HCT: 38.7 % (ref 36.0–46.0)
Hemoglobin: 12.4 g/dL (ref 12.0–15.0)
Immature Granulocytes: 0 %
Lymphocytes Relative: 22 %
Lymphs Abs: 3.1 10*3/uL (ref 0.7–4.0)
MCH: 29.6 pg (ref 26.0–34.0)
MCHC: 32 g/dL (ref 30.0–36.0)
MCV: 92.4 fL (ref 80.0–100.0)
Monocytes Absolute: 0.7 10*3/uL (ref 0.1–1.0)
Monocytes Relative: 5 %
Neutro Abs: 10.1 10*3/uL — ABNORMAL HIGH (ref 1.7–7.7)
Neutrophils Relative %: 71 %
Platelets: 300 10*3/uL (ref 150–400)
RBC: 4.19 MIL/uL (ref 3.87–5.11)
RDW: 12.6 % (ref 11.5–15.5)
WBC: 14.3 10*3/uL — ABNORMAL HIGH (ref 4.0–10.5)
nRBC: 0 % (ref 0.0–0.2)

## 2022-05-24 LAB — URINE DRUG SCREEN, QUALITATIVE (ARMC ONLY)
Amphetamines, Ur Screen: POSITIVE — AB
Barbiturates, Ur Screen: NOT DETECTED
Benzodiazepine, Ur Scrn: NOT DETECTED
Cannabinoid 50 Ng, Ur ~~LOC~~: POSITIVE — AB
Cocaine Metabolite,Ur ~~LOC~~: POSITIVE — AB
MDMA (Ecstasy)Ur Screen: NOT DETECTED
Methadone Scn, Ur: NOT DETECTED
Opiate, Ur Screen: NOT DETECTED
Phencyclidine (PCP) Ur S: NOT DETECTED
Tricyclic, Ur Screen: NOT DETECTED

## 2022-05-24 NOTE — ED Notes (Signed)
Pt given a sandwich meal and drink

## 2022-05-24 NOTE — ED Triage Notes (Signed)
Pt comes into the ED via [employer] voluntarily. Pt denies SI/HI. Pt c/o blood in urine. Pt speaking about random things, like asking if she will be in the hallway again, will we give her food. Pt is homeless., pt has a hx of multiple personalities. Pt denies any pain.

## 2022-05-24 NOTE — ED Provider Notes (Addendum)
[facility] Provider Note    Event Date/Time   First MD Initiated Contact with Patient 05/24/22 2204     (approximate)  History   Chief Complaint: Hematuria  HPI  Autumn [name] is a 46 y.o. female with a past medical history of anemia, bipolar, seizure disorder, multiple personality per patient, presents to the emergency department hoping to get back on her psychiatric medications.  Patient states she is homeless, states she was trying to eat when somebody called the police on her she is not sure why.  Here the patient denies any medical complaints.  States she has been off all of her psychiatric medications for many months and wishes to get back on them.  Patient is also asking for food.  Patient admits to alcohol cocaine and methamphetamine use she is not sure if she used any of these substances today.  No SI or HI.  Physical Exam   Triage Vital Signs: ED Triage Vitals  Enc Vitals Group     BP 05/24/22 1650 110/73     Pulse Rate 05/24/22 1650 63     Resp 05/24/22 1650 16     Temp 05/24/22 1650 98 F (36.7 C)     Temp Source 05/24/22 1650 Oral     SpO2 05/24/22 1650 99 %     Weight 05/24/22 1651 98 lb (44.5 kg)     Height 05/24/22 1651 '5\' 1"'$  (1.549 m)     Head Circumference --      Peak Flow --      Pain Score 05/24/22 1651 0     Pain Loc --      Pain Edu? --      Excl. in [place]? --     Most recent vital signs: Vitals:   05/24/22 1650 05/24/22 2109  BP: 110/73 106/68  Pulse: 63 69  Resp: 16 16  Temp: 98 F (36.7 C) 98.1 F (36.7 C)  SpO2: 99% 98%    General: Awake, no distress.  CV:  Good peripheral perfusion.  Regular rate and rhythm  Resp:  Normal effort.  Equal breath sounds bilaterally.  Abd:  No distention.  Soft, nontender.  No rebound or guarding.   ED Results / Procedures / Treatments   MEDICATIONS ORDERED IN ED: Medications - No data to display   IMPRESSION / MDM / [place] / ED COURSE  I reviewed the triage  vital signs and the nursing notes.  Patient's presentation is most consistent with acute presentation with potential threat to life or bodily function.  Patient presents to the emergency department for evaluation.  Initially she told the triage nurse she was here for blood in her urine.  Patient denies this to myself.  Patient's urinalysis appears normal with no signs of blood or active infection.  CBC does show mild leukocytosis otherwise normal findings chemistry is normal.  Patient does admit to methamphetamine cocaine and alcohol use but is not sure if she used these substances today.  Patient is requesting to speak to a psychiatrist saying that she needs to get back on all of her psychiatric medications that she has been off of for some time.  No SI or HI.  Does not meet IVC criteria.  I have added on a urine drug screen as well as alcohol level.  We will feed the patient and have psychiatry and TTS evaluate.  Patient remains here voluntarily.  I reviewed the patient's recent note she was seen by psychiatry  on the 14th of this month all of her medications were called into the pharmacy.  I informed the patient of this she states she has not picked them up yet.  As the patient has been seen recently by psychiatry has been restarted on all of her medications that are waiting for her at the pharmacy I do not believe there is anything additional that psychiatry can add.  We will discharge the patient.  She asked if she could sleep in the waiting room which I said she could.    FINAL CLINICAL IMPRESSION(S) / ED DIAGNOSES   Substance abuse  Note:  This document was prepared using Dragon voice recognition software and may include unintentional dictation errors.   Harvest Dark, MD 05/24/22 2212    Harvest Dark, MD 05/24/22 2246

## 2022-05-24 NOTE — ED Provider Triage Note (Signed)
  Emergency Medicine Provider Triage Evaluation Note  Shawndrea Rutkowski , a 46 y.o.female,  was evaluated in triage.  Pt complains of hematuria.  Unknown duration.  Patient states that she is homeless and wants something to eat.  Reports " hurting all over".  She states that she recently ingested crack and alcohol last night.  Denies SI/HI or visual/auditory hallucinations.   Review of Systems  Positive: Hematuria, body aches Negative: Denies fever, chest pain, vomiting  Physical Exam   Vitals:   05/24/22 1650  BP: 110/73  Pulse: 63  Resp: 16  Temp: 98 F (36.7 C)  SpO2: 99%   Gen:   Awake, tangential in her speech.  Difficult to communicate with. Resp:  Normal effort  MSK:   Moves extremities without difficulty  Other:    Medical Decision Making  Given the patient's initial medical screening exam, the following diagnostic evaluation has been ordered. The patient will be placed in the appropriate treatment space, once one is available, to complete the evaluation and treatment. I have discussed the plan of care with the patient and I have advised the patient that an ED physician or mid-level practitioner will reevaluate their condition after the test results have been received, as the results may give them additional insight into the type of treatment they may need.    Diagnostics: Labs, UA  Treatments: none immediately   Teodoro Spray, [place] 05/24/22 1651

## 2022-05-24 NOTE — ED Notes (Signed)
Writer providing pt with meal tray and PO juice to take with her per request. Pt wheeled out by Probation officer and security due to pt cursing and making threats to Conservation officer, nature. Discharge paperwork attempted to be gone over by nursing staff but pt sts, "Fuck you bitch." Pt made aware that her actions are not acceptable and that she will not be able to wait in lobby due to her actions and that if she continued to make threats or yelling at staff by entering back into [facility] that writer would not have a choice but to call local law enforcement. Pt apologizing to Probation officer and agrees to remain calm and cooperative while waiting outside ED.

## 2022-05-24 NOTE — ED Notes (Signed)
Pt provided dc ppw, pt stating "fuck you" when attempting to assist to wheelchair. Pt reluctantly assisted to lobby in wheelchair, with sandwich tray and juice.  Pt accompanied by security

## 2022-05-24 NOTE — Discharge Instructions (Signed)
Please follow-up with your doctor soon as possible.  Please fill your medications that are waiting for you at [facility] in [place].  Please avoid drug or alcohol use.

## 2022-05-25 LAB — ETHANOL: Alcohol, Ethyl (B): <10 mg/dL (ref <10)

## 2022-05-29 ENCOUNTER — Emergency Department
Admission: EM | Admit: 2022-05-29 | Discharge: 2022-05-30 | Disposition: A | Discharge status: Home / Self Care | Payer: Medicaid Other | Attending: Emergency Medicine | Admitting: Emergency Medicine

## 2022-05-29 ENCOUNTER — Other Ambulatory Visit: Payer: Self-pay

## 2022-05-29 ENCOUNTER — Encounter: Payer: Self-pay | Admitting: Emergency Medicine

## 2022-05-29 DIAGNOSIS — F332 Major depressive disorder, recurrent severe without psychotic features: Secondary | ICD-10-CM | POA: Insufficient documentation

## 2022-05-29 DIAGNOSIS — F39 Unspecified mood [affective] disorder: Secondary | ICD-10-CM | POA: Insufficient documentation

## 2022-05-29 DIAGNOSIS — F1410 Cocaine abuse, uncomplicated: Secondary | ICD-10-CM | POA: Insufficient documentation

## 2022-05-29 DIAGNOSIS — F1494 Cocaine use, unspecified with cocaine-induced mood disorder: Secondary | ICD-10-CM | POA: Diagnosis not present

## 2022-05-29 DIAGNOSIS — F17200 Nicotine dependence, unspecified, uncomplicated: Secondary | ICD-10-CM | POA: Diagnosis present

## 2022-05-29 DIAGNOSIS — R4689 Other symptoms and signs involving appearance and behavior: Secondary | ICD-10-CM

## 2022-05-29 DIAGNOSIS — F3110 Bipolar disorder, current episode manic without psychotic features, unspecified: Secondary | ICD-10-CM | POA: Diagnosis not present

## 2022-05-29 DIAGNOSIS — F3132 Bipolar disorder, current episode depressed, moderate: Secondary | ICD-10-CM | POA: Diagnosis present

## 2022-05-29 DIAGNOSIS — F17210 Nicotine dependence, cigarettes, uncomplicated: Secondary | ICD-10-CM | POA: Insufficient documentation

## 2022-05-29 DIAGNOSIS — F319 Bipolar disorder, unspecified: Secondary | ICD-10-CM | POA: Diagnosis present

## 2022-05-29 DIAGNOSIS — F1220 Cannabis dependence, uncomplicated: Secondary | ICD-10-CM | POA: Insufficient documentation

## 2022-05-29 DIAGNOSIS — Z20822 Contact with and (suspected) exposure to covid-19: Secondary | ICD-10-CM | POA: Insufficient documentation

## 2022-05-29 DIAGNOSIS — F312 Bipolar disorder, current episode manic severe with psychotic features: Secondary | ICD-10-CM | POA: Diagnosis present

## 2022-05-29 LAB — URINE DRUG SCREEN, QUALITATIVE (ARMC ONLY)
Amphetamines, Ur Screen: NOT DETECTED
Barbiturates, Ur Screen: NOT DETECTED
Benzodiazepine, Ur Scrn: NOT DETECTED
Cannabinoid 50 Ng, Ur ~~LOC~~: POSITIVE — AB
Cocaine Metabolite,Ur ~~LOC~~: POSITIVE — AB
MDMA (Ecstasy)Ur Screen: NOT DETECTED
Methadone Scn, Ur: NOT DETECTED
Opiate, Ur Screen: NOT DETECTED
Phencyclidine (PCP) Ur S: NOT DETECTED
Tricyclic, Ur Screen: NOT DETECTED

## 2022-05-29 LAB — CBC
HCT: 43.1 % (ref 36.0–46.0)
Hemoglobin: 13.6 g/dL (ref 12.0–15.0)
MCH: 30.3 pg (ref 26.0–34.0)
MCHC: 31.6 g/dL (ref 30.0–36.0)
MCV: 96 fL (ref 80.0–100.0)
Platelets: 325 10*3/uL (ref 150–400)
RBC: 4.49 MIL/uL (ref 3.87–5.11)
RDW: 12.5 % (ref 11.5–15.5)
WBC: 16.2 10*3/uL — ABNORMAL HIGH (ref 4.0–10.5)
nRBC: 0 % (ref 0.0–0.2)

## 2022-05-29 LAB — COMPREHENSIVE METABOLIC PANEL
ALT: 16 U/L (ref 0–44)
AST: 13 U/L — ABNORMAL LOW (ref 15–41)
Albumin: 4 g/dL (ref 3.5–5.0)
Alkaline Phosphatase: 76 U/L (ref 38–126)
Anion gap: 7 (ref 5–15)
BUN: 18 mg/dL (ref 6–20)
CO2: 23 mmol/L (ref 22–32)
Calcium: 8.7 mg/dL — ABNORMAL LOW (ref 8.9–10.3)
Chloride: 108 mmol/L (ref 98–111)
Creatinine, Ser: 0.52 mg/dL (ref 0.44–1.00)
GFR, Estimated: >60 mL/min (ref >60)
Glucose, Bld: 111 mg/dL — ABNORMAL HIGH (ref 70–99)
Potassium: 3.5 mmol/L (ref 3.5–5.1)
Sodium: 138 mmol/L (ref 135–145)
Total Bilirubin: 0.4 mg/dL (ref 0.3–1.2)
Total Protein: 7.2 g/dL (ref 6.5–8.1)

## 2022-05-29 LAB — RESP PANEL BY RT-PCR (FLU A&B, COVID) ARPGX2
Influenza A by PCR: NEGATIVE
Influenza B by PCR: NEGATIVE
SARS Coronavirus 2 by RT PCR: NEGATIVE

## 2022-05-29 LAB — ETHANOL: Alcohol, Ethyl (B): <10 mg/dL (ref <10)

## 2022-05-29 MED ORDER — DIPHENHYDRAMINE HCL 50 MG/ML IJ SOLN
50.0000 mg | Freq: Once | INTRAMUSCULAR | Status: AC
Start: 2022-05-29 — End: 2022-05-29

## 2022-05-29 MED ORDER — OLANZAPINE 10 MG IM SOLR
5.0000 mg | Freq: Once | INTRAMUSCULAR | Status: AC
Start: 2022-05-29 — End: 2022-05-29

## 2022-05-29 MED ORDER — DIPHENHYDRAMINE HCL 50 MG/ML IJ SOLN
25.0000 mg | Freq: Once | INTRAMUSCULAR | Status: AC
  Administered 2022-05-29: 25 mg via INTRAMUSCULAR
  Filled 2022-05-29: qty 1

## 2022-05-29 MED ORDER — LORAZEPAM 2 MG/ML IJ SOLN: 2.0000 mg | Freq: Once | INTRAMUSCULAR | Status: DC

## 2022-05-29 MED ORDER — LORAZEPAM 2 MG/ML IJ SOLN
2.0000 mg | Freq: Once | INTRAMUSCULAR | Status: AC
  Administered 2022-05-29: 2 mg via INTRAMUSCULAR
  Filled 2022-05-29: qty 1

## 2022-05-29 MED ORDER — OLANZAPINE 10 MG IM SOLR
INTRAMUSCULAR | Status: AC
  Administered 2022-05-29: 5 mg via INTRAMUSCULAR
  Filled 2022-05-29: qty 10

## 2022-05-29 MED ORDER — LORAZEPAM 2 MG/ML IJ SOLN
2.0000 mg | Freq: Once | INTRAMUSCULAR | Status: DC
  Filled 2022-05-29: qty 1

## 2022-05-29 MED ORDER — DIPHENHYDRAMINE HCL 50 MG/ML IJ SOLN
INTRAMUSCULAR | Status: AC
  Administered 2022-05-29: 50 mg via INTRAMUSCULAR
  Filled 2022-05-29: qty 1

## 2022-05-29 NOTE — ED Provider Triage Note (Signed)
Emergency Medicine Provider Triage Evaluation Note  Autumn [name] , a 46 y.o. female  was evaluated in triage.  For IVC here via PD. C/O of hematuria.    Review of Systems  Positive:  Smokes crack Negative: Denies alcohol use.    Physical Exam  LMP 05/17/2022 (Approximate)  Gen:   Awake, no distress   Resp:  Normal effort  MSK:   Moves extremities without difficulty  Presently handcuffed.   Other:    Medical Decision Making  Medically screening exam initiated at 7:38 AM.  Appropriate orders placed.  Autumn [name] was informed that the remainder of the evaluation will be completed by another provider, this initial triage assessment does not replace that evaluation, and the importance of remaining in the ED until their evaluation is complete.     Autumn Hai, PA-C 05/29/22 [phone]
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
## 2022-05-29 NOTE — ED Notes (Addendum)
Pt seen on camera taking doors down from other rms and piling them up. Pt then walked over to a different rm and started hitting the wall. Pt grabbed the toilet paper from the restroom and throwing it at the door. Pt punching windows. Pt attempting to open the door and then walks over to the sally port door attempting to open it. This tech and Cam- Animal nutritionist in unit talking to pt. Pt stating she is trying to get out of here. Pt throwing remote at tv in her room and throwing her food items all over the room as well. Psych nurse practitioner Jethro Bastos notified and is ordering meds. Dorothy RN made aware of pt behaviors.

## 2022-05-29 NOTE — ED Notes (Addendum)
Pt banging on walls, kicking the glass door demanding to leave. Yanked all the doors off the walls and throwing the tv remote. Refuses to listen to staff. Pt took IM meds per orders willingly. Observed to be naked and laying exposed on bed and staff covered her with blanket. Will continue to monitor and document any changes.

## 2022-05-29 NOTE — ED Notes (Signed)
Report received from triage , RN including SBAR. On initial round after report Pt is warm/dry, sitting in hallway.  Pt is verbally aggressive and demanding.  Will continue to monitor throughout shift as ordered for any changes in behaviors and for continued safety.

## 2022-05-29 NOTE — ED Notes (Signed)
Pt given lunch tray and drink at this time. 

## 2022-05-29 NOTE — ED Notes (Signed)
This tech gave pt a warm blanket when she arrived and pt threw it on the floor and said, "fuck it".

## 2022-05-29 NOTE — ED Notes (Signed)
IVC / pending reassessment in the AM.
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
## 2022-05-29 NOTE — ED Notes (Signed)
Report to include situation, background, assessment and recommendations from [employer]. Patient sleeping, respirations regular and unlabored. Q15 minute rounds and security camera observation to continue.

## 2022-05-29 NOTE — ED Notes (Signed)
IVC per NP reassess in morning

## 2022-05-29 NOTE — ED Notes (Signed)
Pt continues to be verbally aggressive yelling and spitting on the floor in the direction of staff.  Pt has urinated on self and refuses to change pants.

## 2022-05-29 NOTE — ED Notes (Signed)
Snack and beverage given. 

## 2022-05-30 DIAGNOSIS — F1494 Cocaine use, unspecified with cocaine-induced mood disorder: Secondary | ICD-10-CM | POA: Diagnosis present

## 2022-05-30 MED ORDER — LITHIUM CARBONATE ER 300 MG PO TBCR
600.0000 mg | EXTENDED_RELEASE_TABLET | Freq: Two times a day (BID) | ORAL | Status: DC
  Administered 2022-05-30: 600 mg via ORAL
  Filled 2022-05-30: qty 2

## 2022-05-30 MED ORDER — QUETIAPINE FUMARATE 200 MG PO TABS: 200.0000 mg | ORAL_TABLET | Freq: Every day | ORAL | Status: DC

## 2022-05-30 MED ORDER — DIVALPROEX SODIUM 500 MG PO DR TAB
500.0000 mg | DELAYED_RELEASE_TABLET | Freq: Two times a day (BID) | ORAL | Status: DC
  Administered 2022-05-30: 500 mg via ORAL
  Filled 2022-05-30: qty 1

## 2022-05-30 MED ORDER — ARIPIPRAZOLE 5 MG PO TABS
15.0000 mg | ORAL_TABLET | Freq: Every day | ORAL | Status: DC
  Administered 2022-05-30: 15 mg via ORAL
  Filled 2022-05-30: qty 1
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
## 2022-05-30 NOTE — ED Notes (Signed)
Pt is A/Ox 3, Autumn [name] declines any SI/HI stated that she does not have any A/V hallucinations.  Discharge instructions reviewed with pt, they verbalized understanding.  All Belongings accounted for and returned to PT.  Pt left ambulatory via self care.

## 2022-05-30 NOTE — ED Notes (Signed)
Report received from Karie Fetch, Conservation officer, nature. On initial round after report Pt is warm/dry, resting quietly in room without any s/s of distress.  Will continue to monitor throughout shift as ordered for any changes in behaviors and for continued safety.

## 2022-06-08 ENCOUNTER — Emergency Department
Admission: EM | Admit: 2022-06-08 | Discharge: 2022-06-09 | Disposition: A | Discharge status: Home / Self Care | Payer: Medicaid Other | Attending: Emergency Medicine | Admitting: Emergency Medicine

## 2022-06-08 DIAGNOSIS — Y900 Blood alcohol level of less than 20 mg/100 ml: Secondary | ICD-10-CM | POA: Insufficient documentation

## 2022-06-08 DIAGNOSIS — F1494 Cocaine use, unspecified with cocaine-induced mood disorder: Secondary | ICD-10-CM | POA: Diagnosis present

## 2022-06-08 DIAGNOSIS — F17200 Nicotine dependence, unspecified, uncomplicated: Secondary | ICD-10-CM | POA: Diagnosis present

## 2022-06-08 DIAGNOSIS — F312 Bipolar disorder, current episode manic severe with psychotic features: Secondary | ICD-10-CM | POA: Diagnosis present

## 2022-06-08 DIAGNOSIS — F319 Bipolar disorder, unspecified: Secondary | ICD-10-CM | POA: Insufficient documentation

## 2022-06-08 DIAGNOSIS — F332 Major depressive disorder, recurrent severe without psychotic features: Secondary | ICD-10-CM | POA: Diagnosis present

## 2022-06-08 DIAGNOSIS — F1410 Cocaine abuse, uncomplicated: Secondary | ICD-10-CM | POA: Diagnosis present

## 2022-06-08 DIAGNOSIS — F17210 Nicotine dependence, cigarettes, uncomplicated: Secondary | ICD-10-CM | POA: Insufficient documentation

## 2022-06-08 DIAGNOSIS — F14188 Cocaine abuse with other cocaine-induced disorder: Secondary | ICD-10-CM | POA: Insufficient documentation

## 2022-06-08 DIAGNOSIS — F3110 Bipolar disorder, current episode manic without psychotic features, unspecified: Secondary | ICD-10-CM | POA: Diagnosis present

## 2022-06-08 DIAGNOSIS — F69 Unspecified disorder of adult personality and behavior: Secondary | ICD-10-CM

## 2022-06-08 DIAGNOSIS — F10129 Alcohol abuse with intoxication, unspecified: Secondary | ICD-10-CM | POA: Insufficient documentation

## 2022-06-08 DIAGNOSIS — F22 Delusional disorders: Secondary | ICD-10-CM

## 2022-06-08 DIAGNOSIS — F1510 Other stimulant abuse, uncomplicated: Secondary | ICD-10-CM | POA: Insufficient documentation

## 2022-06-08 DIAGNOSIS — F3132 Bipolar disorder, current episode depressed, moderate: Secondary | ICD-10-CM | POA: Diagnosis present

## 2022-06-08 DIAGNOSIS — Z20822 Contact with and (suspected) exposure to covid-19: Secondary | ICD-10-CM | POA: Insufficient documentation

## 2022-06-08 DIAGNOSIS — F1220 Cannabis dependence, uncomplicated: Secondary | ICD-10-CM | POA: Insufficient documentation

## 2022-06-08 LAB — CBC
HCT: 43.9 % (ref 36.0–46.0)
Hemoglobin: 13.8 g/dL (ref 12.0–15.0)
MCH: 28.9 pg (ref 26.0–34.0)
MCHC: 31.4 g/dL (ref 30.0–36.0)
MCV: 92 fL (ref 80.0–100.0)
Platelets: 328 10*3/uL (ref 150–400)
RBC: 4.77 MIL/uL (ref 3.87–5.11)
RDW: 12.5 % (ref 11.5–15.5)
WBC: 11 10*3/uL — ABNORMAL HIGH (ref 4.0–10.5)
nRBC: 0 % (ref 0.0–0.2)

## 2022-06-08 LAB — COMPREHENSIVE METABOLIC PANEL
ALT: 17 U/L (ref 0–44)
AST: 9 U/L — ABNORMAL LOW (ref 15–41)
Albumin: 4.2 g/dL (ref 3.5–5.0)
Alkaline Phosphatase: 75 U/L (ref 38–126)
Anion gap: 6 (ref 5–15)
BUN: 20 mg/dL (ref 6–20)
CO2: 27 mmol/L (ref 22–32)
Calcium: 9.5 mg/dL (ref 8.9–10.3)
Chloride: 105 mmol/L (ref 98–111)
Creatinine, Ser: 0.7 mg/dL (ref 0.44–1.00)
GFR, Estimated: >60 mL/min (ref >60)
Glucose, Bld: 102 mg/dL — ABNORMAL HIGH (ref 70–99)
Potassium: 3.8 mmol/L (ref 3.5–5.1)
Sodium: 138 mmol/L (ref 135–145)
Total Bilirubin: 0.7 mg/dL (ref 0.3–1.2)
Total Protein: 7.8 g/dL (ref 6.5–8.1)

## 2022-06-08 LAB — URINE DRUG SCREEN, QUALITATIVE (ARMC ONLY)
Amphetamines, Ur Screen: POSITIVE — AB
Barbiturates, Ur Screen: NOT DETECTED
Benzodiazepine, Ur Scrn: NOT DETECTED
Cannabinoid 50 Ng, Ur ~~LOC~~: POSITIVE — AB
Cocaine Metabolite,Ur ~~LOC~~: POSITIVE — AB
MDMA (Ecstasy)Ur Screen: NOT DETECTED
Methadone Scn, Ur: NOT DETECTED
Opiate, Ur Screen: NOT DETECTED
Phencyclidine (PCP) Ur S: NOT DETECTED
Tricyclic, Ur Screen: NOT DETECTED

## 2022-06-08 LAB — SARS CORONAVIRUS 2 BY RT PCR: SARS Coronavirus 2 by RT PCR: NEGATIVE

## 2022-06-08 LAB — SALICYLATE LEVEL: Salicylate Lvl: <7 mg/dL — ABNORMAL LOW (ref 7.0–30.0)

## 2022-06-08 LAB — VALPROIC ACID LEVEL: Valproic Acid Lvl: <10 ug/mL — ABNORMAL LOW (ref 50.0–100.0)

## 2022-06-08 LAB — POC URINE PREG, ED: Preg Test, Ur: NEGATIVE

## 2022-06-08 LAB — ETHANOL: Alcohol, Ethyl (B): <10 mg/dL (ref <10)

## 2022-06-08 LAB — LITHIUM LEVEL: Lithium Lvl: <0.06 mmol/L — ABNORMAL LOW (ref 0.60–1.20)

## 2022-06-08 LAB — ACETAMINOPHEN LEVEL: Acetaminophen (Tylenol), Serum: <10 ug/mL — ABNORMAL LOW (ref 10–30)

## 2022-06-08 MED ORDER — DIPHENHYDRAMINE HCL 50 MG/ML IJ SOLN
50.0000 mg | Freq: Once | INTRAMUSCULAR | Status: AC
Start: 2022-06-08 — End: 2022-06-08
  Administered 2022-06-08: 50 mg via INTRAMUSCULAR
  Filled 2022-06-08: qty 1

## 2022-06-08 MED ORDER — LORAZEPAM 2 MG/ML IJ SOLN
2.0000 mg | Freq: Once | INTRAMUSCULAR | Status: AC
Start: 2022-06-08 — End: 2022-06-08
  Administered 2022-06-08: 2 mg via INTRAMUSCULAR
  Filled 2022-06-08: qty 1

## 2022-06-08 NOTE — ED Notes (Signed)
Patient went to restroom and refused to shut door to use the bathroom. Patient came out bathroom with pants around knees and patient was redirected to pull pants up. Patient is currently in room yelling profanity. Patient redirect but continues even after redirection.

## 2022-06-08 NOTE — ED Provider Notes (Signed)
[facility] Provider Note    Event Date/Time   First MD Initiated Contact with Patient 06/08/22 2205     (approximate)   History   Chief Complaint Alcohol Intoxication   HPI  Autumn [name] is a 46 y.o. female with past medical history of polysubstance abuse, bipolar disorder, and seizures who presents to the ED for alcohol intoxication.  Patient arrives to the ED under IVC after reportedly being found by local PD outside of a home stripping naked.  She was found to be acting erratically and somewhat threatening at times.  Sure reports that she was drinking liquor, also admits to drug use but when asked what she was using she states "it does not matter."  She currently denies any medical complaints, states she does not take any medications.     Physical Exam   Triage Vital Signs: ED Triage Vitals  Enc Vitals Group     BP 06/08/22 2105 96/68     Pulse Rate 06/08/22 2105 80     Resp 06/08/22 2105 20     Temp 06/08/22 2105 99.3 F (37.4 C)     Temp src --      SpO2 06/08/22 2105 100 %     Weight 06/08/22 2053 99 lb 3.3 oz (45 kg)     Height 06/08/22 2053 '5\' 1"'$  (1.549 m)     Head Circumference --      Peak Flow --      Pain Score 06/08/22 2053 0     Pain Loc --      Pain Edu? --      Excl. in [place]? --     Most recent vital signs: Vitals:   06/08/22 2105  BP: 96/68  Pulse: 80  Resp: 20  Temp: 99.3 F (37.4 C)  SpO2: 100%    Constitutional: Awake and alert, behaving erratically. Eyes: Conjunctivae are normal. Head: Atraumatic. Nose: No congestion/rhinnorhea. Mouth/Throat: Mucous membranes are moist.  Cardiovascular: Normal rate, regular rhythm. Grossly normal heart sounds.  2+ radial pulses bilaterally. Respiratory: Normal respiratory effort.  No retractions. Lungs CTAB. Gastrointestinal: Soft and nontender. No distention. Musculoskeletal: No lower extremity tenderness nor edema.  Neurologic:  Normal speech and language. No gross focal  neurologic deficits are appreciated.    ED Results / Procedures / Treatments   Labs (all labs ordered are listed, but only abnormal results are displayed) Labs Reviewed  COMPREHENSIVE METABOLIC PANEL - Abnormal; Notable for the following components:      Result Value   Glucose, Bld 102 (*)    AST 9 (*)    All other components within normal limits  SALICYLATE LEVEL - Abnormal; Notable for the following components:   Salicylate Lvl <3.7 (*)    All other components within normal limits  ACETAMINOPHEN LEVEL - Abnormal; Notable for the following components:   Acetaminophen (Tylenol), Serum <10 (*)    All other components within normal limits  CBC - Abnormal; Notable for the following components:   WBC 11.0 (*)    All other components within normal limits  URINE DRUG SCREEN, QUALITATIVE ([facility] ONLY) - Abnormal; Notable for the following components:   Amphetamines, Ur Screen POSITIVE (*)    Cocaine Metabolite,Ur [place] POSITIVE (*)    Cannabinoid 50 Ng, Ur [place] POSITIVE (*)    All other components within normal limits  LITHIUM LEVEL - Abnormal; Notable for the following components:   Lithium Lvl <0.06 (*)    All other components within normal  limits  VALPROIC ACID LEVEL - Abnormal; Notable for the following components:   Valproic Acid Lvl <10 (*)    All other components within normal limits  SARS CORONAVIRUS 2 BY RT PCR  ETHANOL  POC URINE PREG, ED    PROCEDURES:  Critical Care performed: No  Procedures   MEDICATIONS ORDERED IN ED: Medications  LORazepam (ATIVAN) injection 2 mg (2 mg Intramuscular Given 06/08/22 2305)  diphenhydrAMINE (BENADRYL) injection 50 mg (50 mg Intramuscular Given 06/08/22 2305)     IMPRESSION / MDM / ASSESSMENT AND PLAN / ED COURSE  I reviewed the triage vital signs and the nursing notes.                              46 y.o. female with past medical history of polysubstance abuse, bipolar disorder, and seizures who presents to the ED for psychiatric  evaluation after being found acting erratically outside of a home.  Patient's presentation is most consistent with acute presentation with potential threat to life or bodily function.  Differential diagnosis includes, but is not limited to, psychosis, substance abuse, suicidal ideation, homicidal ideation, electrolyte abnormality, medication noncompliance.  Patient nontoxic-appearing and in no acute distress, denies any medical complaints.  She does appear to be acting erratically, reports alcohol and drug use but does not specify exactly what she has taken.  Ethanol level is undetectable although UDS does come back positive for amphetamines, cannabinoids and cocaine.  Remainder of labs are unremarkable with no significant anemia, leukocytosis, electrolyte abnormality, or AKI.  Tylenol and salicylate levels are undetectable, lithium and Depakote levels are also undetectable.  Patient may be medically cleared for psychiatric disposition, was placed under IVC prior to arrival.  The patient has been placed in psychiatric observation due to the need to provide a safe environment for the patient while obtaining psychiatric consultation and evaluation, as well as ongoing medical and medication management to treat the patient's condition.  The patient has been placed under full IVC at this time.      FINAL CLINICAL IMPRESSION(S) / ED DIAGNOSES   Final diagnoses:  None     Rx / DC Orders   ED Discharge Orders     None        Note:  This document was prepared using Dragon voice recognition software and may include unintentional dictation errors.   Blake Divine, MD 06/08/22 2356

## 2022-06-08 NOTE — ED Triage Notes (Signed)
Patient arrived to ED with BPS with IVC paperwork.  Per officers patient was at someone's home drinking beer and stripping clothes off.  Patient states she was drinking everclear. Patient states she is homeless and has no where to go. Patient dressed out by this Probation officer.    Patient belongings -placed in a bag and placed in secure area. One brown boot One blue sandal One black shorts One black tank top .

## 2022-06-08 NOTE — ED Notes (Signed)
Patient in room screaming, "fuck you bitch."

## 2022-06-09 DIAGNOSIS — F319 Bipolar disorder, unspecified: Secondary | ICD-10-CM

## 2022-06-09 DIAGNOSIS — F1410 Cocaine abuse, uncomplicated: Secondary | ICD-10-CM | POA: Diagnosis not present

## 2022-06-09 NOTE — ED Provider Notes (Signed)
Patient seen and cleared for discharge by psychiatry service.  Discharged with plan for urgent follow-up.   Lucrezia Starch, MD 06/09/22 [phone]

## 2022-06-09 NOTE — ED Notes (Addendum)
Patient could not make it to the restroom and urinated on the floor. We had EVS clean the floor,change sheets, and gave new clothes for the patient.

## 2022-06-09 NOTE — ED Notes (Signed)
This RN reviewed paperwork with pt. No further complaints or questions. Pt ambulated to lobby. Discharge form placed in med rec box.  
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
## 2022-06-09 NOTE — Consult Note (Signed)
[facility] Consult   Reason for Consult:Alcohol Intoxication  Referring Physician: Dr. Charna Archer Patient Identification: Autumn [name] MRN:  [id number] Principal Diagnosis: <principal problem not specified> Diagnosis:  Active Problems:   Tobacco use disorder   Methamphetamine abuse ([place])   Cannabis use disorder, severe, dependence ([place])   Bipolar affective disorder, current episode manic ([place])   Affective psychosis, bipolar ([place])   MDD (major depressive disorder), recurrent severe, without psychosis ([place])   Cocaine abuse ([place])   Bipolar affective disorder, current episode manic with psychotic symptoms ([place])   Bipolar 1 disorder ([place])   Cocaine-induced mood disorder ([place])   Total Time spent with patient: 45 minutes  Subjective: "No, I don't want to hurt myself." Autumn [name] is a 46 y.o. female patient presented to [facility] ED via law enforcement is placed under involuntary commitment status (IVC) due to her being under the influence.     Per the ED triage nurses note, Patient arrived to ED with BPS with IVC paperwork.  Per officers patient was at someone's home drinking beer and stripping clothes off.  Patient states she was drinking everclear. Patient states she is homeless and has no where to go.   This provider saw The patient face-to-face; the chart was reviewed and consulted with Dr.Smith on 06/09/2022 due to the patient's care. It was discussed with the EDP that the patient would remain under observation overnight and be reassessed in the a.m. to determine if he meets the criteria for psychiatric inpatient admission; he could be discharged home. Due to her increased substance use and aggressive behavior, she continues to present during her stay in the ED and has to be medicated.  On evaluation, the patient is alert and oriented x 1-2 when awake and is aggressive to staff, uncooperative, and mood-congruent with affect.  The patient does appear to be responding to  internal and external stimuli. The patient is presenting with delusional thinking. The patient denies auditory or visual hallucinations. The patient denies any suicidal, homicidal, or self-harm ideations. The patient is presenting with some psychotic and paranoid behaviors. During an encounter with the patient, they could not answer questions appropriately.  HPI: Per Dr. Charna Archer, Autumn [name] is a 46 y.o. female with past medical history of polysubstance abuse, bipolar disorder, and seizures who presents to the ED for alcohol intoxication.  Patient arrives to the ED under IVC after reportedly being found by local PD outside of a home stripping naked.  She was found to be acting erratically and somewhat threatening at times.  Sure reports that she was drinking liquor, also admits to drug use but when asked what she was using she states "it does not matter."  She currently denies any medical complaints, states she does not take any medications.  Past Psychiatric History:  Bipolar 1 disorder ([place]) Seizures ([place])  Risk to Self:   Risk to Others:   Prior Inpatient Therapy:   Prior Outpatient Therapy:    Past Medical History:  Past Medical History:  Diagnosis Date   Allergy    Seasonal, Ultram (seizures)   Anemia 2005   Bipolar 1 disorder ([place])    Glaucoma    Seizures ([place]) 2008    Past Surgical History:  Procedure Laterality Date   BREAST EXCISIONAL BIOPSY Right    x 2   CESAREAN SECTION     x 2   LEEP  2000   Family History:  Family History  Problem Relation Age of Onset   Hypertension Mother  Hyperlipidemia Mother    Asthma Mother    Parkinson's disease Father    Lung cancer Maternal Grandmother    Other Paternal Grandfather 55       Cardiac arrest   Heart disease Paternal Grandfather    Tourette syndrome Daughter    Family Psychiatric  History:  Social History:  Social History   Substance and Sexual Activity  Alcohol Use Not Currently   Alcohol/week: 8.0 standard  drinks of alcohol   Types: 8 Shots of liquor per week   Comment: last use 03/30/22 4x/yr     Social History   Substance and Sexual Activity  Drug Use Yes   Frequency: 1.0 times per week   Types: Marijuana, Cocaine, Heroin, Methamphetamines, MDMA (Ecstacy)   Comment: last use yesterday    Social History   Socioeconomic History   Marital status: Legally Separated    Spouse name: Not on file   Number of children: Not on file   Years of education: Not on file   Highest education level: Not on file  Occupational History   Not on file  Tobacco Use   Smoking status: Every Day    Packs/day: 0.50    Years: 9.00    Total pack years: 4.50    Types: E-cigarettes, Cigarettes, Cigars   Smokeless tobacco: Former    Types: Nurse, children's Use: Some days   Substances: Nicotine  Substance and Sexual Activity   Alcohol use: Not Currently    Alcohol/week: 8.0 standard drinks of alcohol    Types: 8 Shots of liquor per week    Comment: last use 03/30/22 4x/yr   Drug use: Yes    Frequency: 1.0 times per week    Types: Marijuana, Cocaine, Heroin, Methamphetamines, MDMA (Ecstacy)    Comment: last use yesterday   Sexual activity: Yes    Partners: Male    Birth control/protection: None  Other Topics Concern   Not on file  Social History Narrative   Not on file   Social Determinants of Health   Financial Resource Strain: Not on file  Food Insecurity: Not on file  Transportation Needs: Not on file  Physical Activity: Not on file  Stress: Not on file  Social Connections: Not on file   Additional Social History:    Allergies:   Allergies  Allergen Reactions   Haldol [Haloperidol Lactate]     Patient states that she gets stiff muscles when taking Haldol   Ultram [Tramadol] Other (See Comments)    Seizures   Ziprasidone Hcl     Other reaction(s): Other (See Comments) PER PT WHEN SHE TAKES THIS SHE CANT MOVE HER NECK    Labs:  Results for orders placed or performed  during the hospital encounter of 06/08/22 (from the past 48 hour(s))  Comprehensive metabolic panel     Status: Abnormal   Collection Time: 06/08/22  8:45 PM  Result Value Ref Range   Sodium 138 135 - 145 mmol/L   Potassium 3.8 3.5 - 5.1 mmol/L   Chloride 105 98 - 111 mmol/L   CO2 27 22 - 32 mmol/L   Glucose, Bld 102 (H) 70 - 99 mg/dL    Comment: Glucose reference range applies only to samples taken after fasting for at least 8 hours.   BUN 20 6 - 20 mg/dL   Creatinine, Ser 0.70 0.44 - 1.00 mg/dL   Calcium 9.5 8.9 - 10.3 mg/dL   Total Protein 7.8 6.5 - 8.1 g/dL  Albumin 4.2 3.5 - 5.0 g/dL   AST 9 (L) 15 - 41 U/L   ALT 17 0 - 44 U/L   Alkaline Phosphatase 75 38 - 126 U/L   Total Bilirubin 0.7 0.3 - 1.2 mg/dL   GFR, Estimated >60 >60 mL/min    Comment: (NOTE) Calculated using the CKD-EPI Creatinine Equation (2021)    Anion gap 6 5 - 15    Comment: Performed at [facility], [address] [place], [place] [address]  Ethanol     Status: None   Collection Time: 06/08/22  8:45 PM  Result Value Ref Range   Alcohol, Ethyl (B) <10 <10 mg/dL    Comment: (NOTE) Lowest detectable limit for serum alcohol is 10 mg/dL.  For medical purposes only. Performed at [facility], [address] [place], [place] [address]   Salicylate level     Status: Abnormal   Collection Time: 06/08/22  8:45 PM  Result Value Ref Range   Salicylate Lvl <3.3 (L) 7.0 - 30.0 mg/dL    Comment: Performed at [facility], [address] [place], [place] [address]  Acetaminophen level     Status: Abnormal   Collection Time: 06/08/22  8:45 PM  Result Value Ref Range   Acetaminophen (Tylenol), Serum <10 (L) 10 - 30 ug/mL    Comment: (NOTE) Therapeutic concentrations vary significantly. A range of 10-30 ug/mL  may be an effective concentration for many patients. However, some  are best treated at concentrations outside of this range. Acetaminophen concentrations >150 ug/mL at 4  hours after ingestion  and >50 ug/mL at 12 hours after ingestion are often associated with  toxic reactions.  Performed at [facility], [address] [place], [place] [address]   cbc     Status: Abnormal   Collection Time: 06/08/22  8:45 PM  Result Value Ref Range   WBC 11.0 (H) 4.0 - 10.5 K/uL   RBC 4.77 3.87 - 5.11 MIL/uL   Hemoglobin 13.8 12.0 - 15.0 g/dL   HCT 43.9 36.0 - 46.0 %   MCV 92.0 80.0 - 100.0 fL   MCH 28.9 26.0 - 34.0 pg   MCHC 31.4 30.0 - 36.0 g/dL   RDW 12.5 11.5 - 15.5 %   Platelets 328 150 - 400 K/uL   nRBC 0.0 0.0 - 0.2 %    Comment: Performed at [facility], [address], [place],  [address]  SARS Coronavirus 2 by RT PCR (hospital order, performed in [name] hospital lab) *cepheid single result test* Anterior Nasal Swab     Status: None   Collection Time: 06/08/22  8:45 PM   Specimen: Anterior Nasal Swab  Result Value Ref Range   SARS Coronavirus 2 by RT PCR NEGATIVE NEGATIVE    Comment: (NOTE) SARS-CoV-2 target nucleic acids are NOT DETECTED.  The SARS-CoV-2 RNA is generally detectable in upper and lower respiratory specimens during the acute phase of infection. The lowest concentration of SARS-CoV-2 viral copies this assay can detect is 250 copies / mL. A negative result does not preclude SARS-CoV-2 infection and should not be used as the sole basis for treatment or other patient management decisions.  A negative result may occur with improper specimen collection / handling, submission of specimen other than nasopharyngeal swab, presence of viral mutation(s) within the areas targeted by this assay, and inadequate number of viral copies (<250 copies / mL). A negative result must be combined with clinical observations, patient history, and  epidemiological information.  Fact Sheet for Patients:   https://www.patel.info/  Fact Sheet for Healthcare  Providers: https://hall.com/  This test is not yet approved or  cleared by the [place] FDA and has been authorized for detection and/or diagnosis of SARS-CoV-2 by FDA under an Emergency Use Authorization (EUA).  This EUA will remain in effect (meaning this test can be used) for the duration of the COVID-19 declaration under Section 564(b)(1) of the Act, 21 U.S.C. section 360bbb-3(b)(1), unless the authorization is terminated or revoked sooner.  Performed at [facility], [address] [place], [place] [address]   Lithium level     Status: Abnormal   Collection Time: 06/08/22  8:45 PM  Result Value Ref Range   Lithium Lvl <0.06 (L) 0.60 - 1.20 mmol/L    Comment: Performed at [facility], [address] [place], [place] [address]  Valproic acid level     Status: Abnormal   Collection Time: 06/08/22  8:45 PM  Result Value Ref Range   Valproic Acid Lvl <10 (L) 50.0 - 100.0 ug/mL    Comment: Performed at [facility], [address], [place], [place] [address]  Urine Drug Screen, Qualitative     Status: Abnormal   Collection Time: 06/08/22 10:38 PM  Result Value Ref Range   Tricyclic, Ur Screen NONE DETECTED NONE DETECTED   Amphetamines, Ur Screen POSITIVE (A) NONE DETECTED   MDMA (Ecstasy)Ur Screen NONE DETECTED NONE DETECTED   Cocaine Metabolite,Ur [place] POSITIVE (A) NONE DETECTED   Opiate, Ur Screen NONE DETECTED NONE DETECTED   Phencyclidine (PCP) Ur S NONE DETECTED NONE DETECTED   Cannabinoid 50 Ng, Ur [place] POSITIVE (A) NONE DETECTED   Barbiturates, Ur Screen NONE DETECTED NONE DETECTED   Benzodiazepine, Ur Scrn NONE DETECTED NONE DETECTED   Methadone Scn, Ur NONE DETECTED NONE DETECTED    Comment: (NOTE) Tricyclics + metabolites, urine    Cutoff 1000 ng/mL Amphetamines + metabolites, urine  Cutoff 1000 ng/mL MDMA (Ecstasy), urine              Cutoff 500 ng/mL Cocaine Metabolite, urine          Cutoff 300  ng/mL Opiate + metabolites, urine        Cutoff 300 ng/mL Phencyclidine (PCP), urine         Cutoff 25 ng/mL Cannabinoid, urine                 Cutoff 50 ng/mL Barbiturates + metabolites, urine  Cutoff 200 ng/mL Benzodiazepine, urine              Cutoff 200 ng/mL Methadone, urine                   Cutoff 300 ng/mL  The urine drug screen provides only a preliminary, unconfirmed analytical test result and should not be used for non-medical purposes. Clinical consideration and professional judgment should be applied to any positive drug screen result due to possible interfering substances. A more specific alternate chemical method must be used in order to obtain a confirmed analytical result. Gas chromatography / mass spectrometry (GC/MS) is the preferred confirm atory method. Performed at [facility], [address] [name], [place] [address]   POC urine preg, ED     Status: None   Collection Time: 06/08/22 10:40 PM  Result Value Ref Range   Preg Test, Ur NEGATIVE NEGATIVE    Comment:        THE SENSITIVITY OF THIS  METHODOLOGY IS >24 mIU/mL     No current facility-administered medications for this encounter.   Current Outpatient Medications  Medication Sig Dispense Refill   ARIPiprazole (ABILIFY) 15 MG tablet Take 1 tablet (15 mg total) by mouth daily. (Patient not taking: Reported on 05/29/2022) 30 tablet 0   divalproex (DEPAKOTE) 250 MG DR tablet Take 2 tablets (500 mg total) by mouth every 12 (twelve) hours. (Patient not taking: Reported on 05/29/2022) 120 tablet 0   lithium carbonate (LITHOBID) 300 MG CR tablet Take 2 tablets (600 mg total) by mouth every 12 (twelve) hours. (Patient not taking: Reported on 05/29/2022) 120 tablet 0   norethindrone (MICRONOR) 0.35 MG tablet Take 1 tablet (0.35 mg total) by mouth daily. (Patient not taking: Reported on 05/20/2022) 28 tablet 13   QUEtiapine (SEROQUEL) 200 MG tablet Take 1 tablet (200 mg total) by mouth at bedtime. (Patient  not taking: Reported on 05/29/2022) 30 tablet 0    Musculoskeletal: Strength & Muscle Tone: within normal limits Gait & Station: normal Patient leans: N/A  Psychiatric Specialty Exam:  Presentation  General Appearance: Disheveled  Eye Contact:Minimal  Speech:Slurred  Speech Volume:Decreased  Handedness:Right   Mood and Affect  Mood:Irritable; Dysphoric  Affect:Inappropriate; Full Range   Thought Process  Thought Processes:Disorganized  Descriptions of Associations:Loose  Orientation:Partial  Thought Content:Illogical  History of Schizophrenia/Schizoaffective disorder:Yes  Duration of Psychotic Symptoms:Greater than six months  Hallucinations:No data recorded Ideas of Reference:None  Suicidal Thoughts:No data recorded Homicidal Thoughts:No data recorded  Sensorium  Memory:Immediate Poor  Judgment:Impaired  Insight:Lacking   Executive Functions  Concentration:Poor  Attention Span:Poor  Recall:Poor  Fund of Knowledge:Poor  Language:Poor   Psychomotor Activity  Psychomotor Activity:Psychomotor Activity: Normal   Assets  Assets:Financial Resources/Insurance; Housing; Physical Health; Resilience; Social Support   Sleep  Sleep:Sleep: Poor   Physical Exam: Physical Exam Vitals and nursing note reviewed.  HENT:     Head: Normocephalic and atraumatic.     Right Ear: External ear normal.     Left Ear: External ear normal.     Nose: Nose normal.     Mouth/Throat:     Mouth: Mucous membranes are dry.  Cardiovascular:     Rate and Rhythm: Normal rate.     Pulses: Normal pulses.  Pulmonary:     Effort: Pulmonary effort is normal.  Musculoskeletal:        General: Normal range of motion.     Cervical back: Normal range of motion and neck supple.  Neurological:     Mental Status: She is alert.  Psychiatric:        Mood and Affect: Mood is depressed. Affect is labile, blunt, flat and inappropriate.        Speech: Speech is delayed.         Behavior: Behavior is uncooperative.        Thought Content: Thought content is paranoid and delusional.        Cognition and Memory: Cognition is impaired.        Judgment: Judgment is impulsive and inappropriate.    ROS Blood pressure 96/68, pulse 80, temperature 99.3 F (37.4 C), resp. rate 20, height '5\' 1"'$  (1.549 m), weight 45 kg, last menstrual period 05/17/2022, SpO2 100 %. Body mass index is 18.74 kg/m.  Treatment Plan Summary: Plan The patient remained under observation overnight and will be reassessed in the a.m. to determine if she meets the criteria for psychiatric inpatient admission; she could be discharged home.  Disposition: Supportive therapy provided about  ongoing stressors. The patient remained under observation overnight and will be reassessed in the a.m. to determine if she meets the criteria for psychiatric inpatient admission; she could be discharged home.  Caroline Sauger, NP 06/09/2022 2:26 AM

## 2022-06-09 NOTE — Consult Note (Signed)
[facility] Psych ED Progress Note  06/09/2022 11:07 AM Autumn [name]  MRN:  [id number]   Method of visit?: Face to Face   Subjective:  "I just want to leave." Patient admits to cocaine use last night. She speaks in coherent sentences. Denies suicidal or homicidal ideation. Is coherent. She denies auditory and visual hallucinations. Does not appear to be responding to internal stimuli. UDS positive for cocaine. Discussed with patient her frequent ED visits and drug use. Patient states she does not want  to get help for her substance use. Discussed follow up with [facility] for outpatient treatment.   Principal Problem: Cocaine abuse ([place]) Diagnosis:  Principal Problem:   Cocaine abuse ([place]) Active Problems:   Cannabis use disorder, severe, dependence (HCC)   Bipolar affective disorder, current episode manic ([place])   Tobacco use disorder   Methamphetamine abuse ([place])   Affective psychosis, bipolar ([place])   MDD (major depressive disorder), recurrent severe, without psychosis ([place])   Bipolar affective disorder, current episode manic with psychotic symptoms ([place])   Bipolar 1 disorder ([place])   Cocaine-induced mood disorder ([place])  Total Time spent with patient: 20 minutes  Past Psychiatric History:   Past Medical History:  Past Medical History:  Diagnosis Date   Allergy    Seasonal, Ultram (seizures)   Anemia 2005   Bipolar 1 disorder ([place])    Glaucoma    Seizures ([place]) 2008    Past Surgical History:  Procedure Laterality Date   BREAST EXCISIONAL BIOPSY Right    x 2   CESAREAN SECTION     x 2   LEEP  2000   Family History:  Family History  Problem Relation Age of Onset   Hypertension Mother    Hyperlipidemia Mother    Asthma Mother    Parkinson's disease Father    Lung cancer Maternal Grandmother    Other Paternal Grandfather 68       Cardiac arrest   Heart disease Paternal Grandfather    Tourette syndrome Daughter    Family Psychiatric  History:  Social History:  Social History    Substance and Sexual Activity  Alcohol Use Not Currently   Alcohol/week: 8.0 standard drinks of alcohol   Types: 8 Shots of liquor per week   Comment: last use 03/30/22 4x/yr     Social History   Substance and Sexual Activity  Drug Use Yes   Frequency: 1.0 times per week   Types: Marijuana, Cocaine, Heroin, Methamphetamines, MDMA (Ecstacy)   Comment: last use yesterday    Social History   Socioeconomic History   Marital status: Legally Separated    Spouse name: Not on file   Number of children: Not on file   Years of education: Not on file   Highest education level: Not on file  Occupational History   Not on file  Tobacco Use   Smoking status: Every Day    Packs/day: 0.50    Years: 9.00    Total pack years: 4.50    Types: E-cigarettes, Cigarettes, Cigars   Smokeless tobacco: Former    Types: Nurse, children's Use: Some days   Substances: Nicotine  Substance and Sexual Activity   Alcohol use: Not Currently    Alcohol/week: 8.0 standard drinks of alcohol    Types: 8 Shots of liquor per week    Comment: last use 03/30/22 4x/yr   Drug use: Yes    Frequency: 1.0 times per week    Types: Marijuana, Cocaine, Heroin,  Methamphetamines, MDMA (Ecstacy)    Comment: last use yesterday   Sexual activity: Yes    Partners: Male    Birth control/protection: None  Other Topics Concern   Not on file  Social History Narrative   Not on file   Social Determinants of Health   Financial Resource Strain: Not on file  Food Insecurity: Not on file  Transportation Needs: Not on file  Physical Activity: Not on file  Stress: Not on file  Social Connections: Not on file    Sleep: Good  Appetite:  Fair  Current Medications: No current facility-administered medications for this encounter.   Current Outpatient Medications  Medication Sig Dispense Refill   ARIPiprazole (ABILIFY) 15 MG tablet Take 1 tablet (15 mg total) by mouth daily. (Patient not taking: Reported on  05/29/2022) 30 tablet 0   divalproex (DEPAKOTE) 250 MG DR tablet Take 2 tablets (500 mg total) by mouth every 12 (twelve) hours. (Patient not taking: Reported on 05/29/2022) 120 tablet 0   lithium carbonate (LITHOBID) 300 MG CR tablet Take 2 tablets (600 mg total) by mouth every 12 (twelve) hours. (Patient not taking: Reported on 05/29/2022) 120 tablet 0   norethindrone (MICRONOR) 0.35 MG tablet Take 1 tablet (0.35 mg total) by mouth daily. (Patient not taking: Reported on 05/20/2022) 28 tablet 13   QUEtiapine (SEROQUEL) 200 MG tablet Take 1 tablet (200 mg total) by mouth at bedtime. (Patient not taking: Reported on 05/29/2022) 30 tablet 0    Lab Results:  Results for orders placed or performed during the hospital encounter of 06/08/22 (from the past 48 hour(s))  Comprehensive metabolic panel     Status: Abnormal   Collection Time: 06/08/22  8:45 PM  Result Value Ref Range   Sodium 138 135 - 145 mmol/L   Potassium 3.8 3.5 - 5.1 mmol/L   Chloride 105 98 - 111 mmol/L   CO2 27 22 - 32 mmol/L   Glucose, Bld 102 (H) 70 - 99 mg/dL    Comment: Glucose reference range applies only to samples taken after fasting for at least 8 hours.   BUN 20 6 - 20 mg/dL   Creatinine, Ser 0.70 0.44 - 1.00 mg/dL   Calcium 9.5 8.9 - 10.3 mg/dL   Total Protein 7.8 6.5 - 8.1 g/dL   Albumin 4.2 3.5 - 5.0 g/dL   AST 9 (L) 15 - 41 U/L   ALT 17 0 - 44 U/L   Alkaline Phosphatase 75 38 - 126 U/L   Total Bilirubin 0.7 0.3 - 1.2 mg/dL   GFR, Estimated >60 >60 mL/min    Comment: (NOTE) Calculated using the CKD-EPI Creatinine Equation (2021)    Anion gap 6 5 - 15    Comment: Performed at [facility], [address] [place], [place] [address]  Ethanol     Status: None   Collection Time: 06/08/22  8:45 PM  Result Value Ref Range   Alcohol, Ethyl (B) <10 <10 mg/dL    Comment: (NOTE) Lowest detectable limit for serum alcohol is 10 mg/dL.  For medical purposes only. Performed at [facility], [address] [place], [place] [address]   Salicylate level     Status: Abnormal   Collection Time: 06/08/22  8:45 PM  Result Value Ref Range   Salicylate Lvl <7.3 (L) 7.0 - 30.0 mg/dL    Comment: Performed at [facility], [address], [place], [place] [address]  Acetaminophen level     Status: Abnormal  Collection Time: 06/08/22  8:45 PM  Result Value Ref Range   Acetaminophen (Tylenol), Serum <10 (L) 10 - 30 ug/mL    Comment: (NOTE) Therapeutic concentrations vary significantly. A range of 10-30 ug/mL  may be an effective concentration for many patients. However, some  are best treated at concentrations outside of this range. Acetaminophen concentrations >150 ug/mL at 4 hours after ingestion  and >50 ug/mL at 12 hours after ingestion are often associated with  toxic reactions.  Performed at [facility], [address] [place], [place] [address]   cbc     Status: Abnormal   Collection Time: 06/08/22  8:45 PM  Result Value Ref Range   WBC 11.0 (H) 4.0 - 10.5 K/uL   RBC 4.77 3.87 - 5.11 MIL/uL   Hemoglobin 13.8 12.0 - 15.0 g/dL   HCT 43.9 36.0 - 46.0 %   MCV 92.0 80.0 - 100.0 fL   MCH 28.9 26.0 - 34.0 pg   MCHC 31.4 30.0 - 36.0 g/dL   RDW 12.5 11.5 - 15.5 %   Platelets 328 150 - 400 K/uL   nRBC 0.0 0.0 - 0.2 %    Comment: Performed at [facility], [address] [place], [place] [address]  SARS Coronavirus 2 by RT PCR (hospital order, performed in [facility] hospital lab) *cepheid single result test* Anterior Nasal Swab     Status: None   Collection Time: 06/08/22  8:45 PM   Specimen: Anterior Nasal Swab  Result Value Ref Range   SARS Coronavirus 2 by RT PCR NEGATIVE NEGATIVE    Comment: (NOTE) SARS-CoV-2 target nucleic acids are NOT DETECTED.  The SARS-CoV-2 RNA is generally detectable in upper and lower respiratory specimens during the acute phase of infection. The lowest concentration of SARS-CoV-2 viral copies this assay can  detect is 250 copies / mL. A negative result does not preclude SARS-CoV-2 infection and should not be used as the sole basis for treatment or other patient management decisions.  A negative result may occur with improper specimen collection / handling, submission of specimen other than nasopharyngeal swab, presence of viral mutation(s) within the areas targeted by this assay, and inadequate number of viral copies (<250 copies / mL). A negative result must be combined with clinical observations, patient history, and epidemiological information.  Fact Sheet for Patients:   https://www.patel.info/  Fact Sheet for Healthcare Providers: https://hall.com/  This test is not yet approved or  cleared by the [place] FDA and has been authorized for detection and/or diagnosis of SARS-CoV-2 by FDA under an Emergency Use Authorization (EUA).  This EUA will remain in effect (meaning this test can be used) for the duration of the COVID-19 declaration under Section 564(b)(1) of the Act, 21 U.S.C. section 360bbb-3(b)(1), unless the authorization is terminated or revoked sooner.  Performed at [facility], [address] [place], [place] [address]   Lithium level     Status: Abnormal   Collection Time: 06/08/22  8:45 PM  Result Value Ref Range   Lithium Lvl <0.06 (L) 0.60 - 1.20 mmol/L    Comment: Performed at [facility], [address] [place], [place] [address]  Valproic acid level     Status: Abnormal   Collection Time: 06/08/22  8:45 PM  Result Value Ref Range   Valproic Acid Lvl <10 (L) 50.0 - 100.0 ug/mL    Comment: Performed at [facility], [address] [place], [place] [address]  Urine Drug Screen, Qualitative  Status: Abnormal   Collection Time: 06/08/22 10:38 PM  Result Value Ref Range   Tricyclic, Ur Screen NONE DETECTED NONE DETECTED   Amphetamines, Ur Screen POSITIVE (A) NONE DETECTED   MDMA  (Ecstasy)Ur Screen NONE DETECTED NONE DETECTED   Cocaine Metabolite,Ur [place] POSITIVE (A) NONE DETECTED   Opiate, Ur Screen NONE DETECTED NONE DETECTED   Phencyclidine (PCP) Ur S NONE DETECTED NONE DETECTED   Cannabinoid 50 Ng, Ur [place] POSITIVE (A) NONE DETECTED   Barbiturates, Ur Screen NONE DETECTED NONE DETECTED   Benzodiazepine, Ur Scrn NONE DETECTED NONE DETECTED   Methadone Scn, Ur NONE DETECTED NONE DETECTED    Comment: (NOTE) Tricyclics + metabolites, urine    Cutoff 1000 ng/mL Amphetamines + metabolites, urine  Cutoff 1000 ng/mL MDMA (Ecstasy), urine              Cutoff 500 ng/mL Cocaine Metabolite, urine          Cutoff 300 ng/mL Opiate + metabolites, urine        Cutoff 300 ng/mL Phencyclidine (PCP), urine         Cutoff 25 ng/mL Cannabinoid, urine                 Cutoff 50 ng/mL Barbiturates + metabolites, urine  Cutoff 200 ng/mL Benzodiazepine, urine              Cutoff 200 ng/mL Methadone, urine                   Cutoff 300 ng/mL  The urine drug screen provides only a preliminary, unconfirmed analytical test result and should not be used for non-medical purposes. Clinical consideration and professional judgment should be applied to any positive drug screen result due to possible interfering substances. A more specific alternate chemical method must be used in order to obtain a confirmed analytical result. Gas chromatography / mass spectrometry (GC/MS) is the preferred confirm atory method. Performed at [facility], [address] [place], [place] [address]   POC urine preg, ED     Status: None   Collection Time: 06/08/22 10:40 PM  Result Value Ref Range   Preg Test, Ur NEGATIVE NEGATIVE    Comment:        THE SENSITIVITY OF THIS METHODOLOGY IS >24 mIU/mL     Blood Alcohol level:  Lab Results  Component Value Date   ETH <10 06/08/2022   ETH <10 05/29/2022    Physical Findings: AIMS:  , ,  ,  ,    CIWA:    COWS:     Musculoskeletal: Strength &  Muscle Tone: within normal limits Gait & Station: normal Patient leans: N/A  Psychiatric Specialty Exam:  Presentation  General Appearance: Casual  Eye Contact:Good  Speech:Clear and Coherent  Speech Volume:Normal  Handedness:Right   Mood and Affect  Mood:Euthymic  Affect:Congruent   Thought Process  Thought Processes:Coherent  Descriptions of Associations:Intact  Orientation:Full (Time, Place and Person)  Thought Content:WDL  History of Schizophrenia/Schizoaffective disorder:No  Duration of Psychotic Symptoms:N/A  Hallucinations:Hallucinations: None  Ideas of Reference:None  Suicidal Thoughts:Suicidal Thoughts: No  Homicidal Thoughts:Homicidal Thoughts: No   Sensorium  Memory:Immediate Good  Judgment:Poor  Insight:Poor   Executive Functions  Concentration:Fair  Attention Span:Fair  Sabin   Psychomotor Activity  Psychomotor Activity:Psychomotor Activity: Normal   Assets  Assets:Resilience; Physical Health   Sleep  Sleep:Sleep: Good    Physical Exam: Physical Exam ROS Blood pressure (!) 97/58, pulse 63,  temperature 98 F (36.7 C), temperature source Oral, resp. rate 18, height '5\' 1"'$  (1.549 m), weight 45 kg, last menstrual period 05/17/2022, SpO2 96 %. Body mass index is 18.74 kg/m.  Treatment Plan Summary: Plan Patient is not suicidal or homicidal. Not responding to internal stimuli. No longer meets criteria for involuntary commitment and is asking for discharge. Reviewed with EDP  Sherlon Handing, NP 06/09/2022, 11:07 AM

## 2022-06-09 NOTE — ED Notes (Signed)
Hospital meal provided, pt tolerated w/o complaints.  Waste discarded appropriately.  
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
## 2022-06-29 ENCOUNTER — Emergency Department
Admission: EM | Admit: 2022-06-29 | Discharge: 2022-06-29 | Disposition: A | Discharge status: Other Acute Inpatient Hospital | Payer: Medicaid Other | Attending: Emergency Medicine | Admitting: Emergency Medicine

## 2022-06-29 ENCOUNTER — Inpatient Hospital Stay
Admission: AD | Admit: 2022-06-29 | Discharge: 2022-07-30 | DRG: 885 | Disposition: A | Discharge status: Home / Self Care | Payer: 59 | Source: Intra-hospital | Attending: Psychiatry | Admitting: Psychiatry

## 2022-06-29 ENCOUNTER — Encounter: Payer: Self-pay | Admitting: Emergency Medicine

## 2022-06-29 ENCOUNTER — Other Ambulatory Visit: Payer: Self-pay

## 2022-06-29 DIAGNOSIS — F1410 Cocaine abuse, uncomplicated: Secondary | ICD-10-CM | POA: Diagnosis present

## 2022-06-29 DIAGNOSIS — F3112 Bipolar disorder, current episode manic without psychotic features, moderate: Secondary | ICD-10-CM | POA: Diagnosis not present

## 2022-06-29 DIAGNOSIS — Z83438 Family history of other disorder of lipoprotein metabolism and other lipidemia: Secondary | ICD-10-CM | POA: Diagnosis not present

## 2022-06-29 DIAGNOSIS — F419 Anxiety disorder, unspecified: Secondary | ICD-10-CM | POA: Diagnosis present

## 2022-06-29 DIAGNOSIS — Z820 Family history of epilepsy and other diseases of the nervous system: Secondary | ICD-10-CM

## 2022-06-29 DIAGNOSIS — F1510 Other stimulant abuse, uncomplicated: Secondary | ICD-10-CM | POA: Diagnosis present

## 2022-06-29 DIAGNOSIS — F1110 Opioid abuse, uncomplicated: Secondary | ICD-10-CM | POA: Diagnosis present

## 2022-06-29 DIAGNOSIS — F1210 Cannabis abuse, uncomplicated: Secondary | ICD-10-CM | POA: Diagnosis present

## 2022-06-29 DIAGNOSIS — F319 Bipolar disorder, unspecified: Secondary | ICD-10-CM | POA: Diagnosis present

## 2022-06-29 DIAGNOSIS — F17210 Nicotine dependence, cigarettes, uncomplicated: Secondary | ICD-10-CM | POA: Diagnosis present

## 2022-06-29 DIAGNOSIS — Z635 Disruption of family by separation and divorce: Secondary | ICD-10-CM | POA: Diagnosis not present

## 2022-06-29 DIAGNOSIS — Z20822 Contact with and (suspected) exposure to covid-19: Secondary | ICD-10-CM | POA: Diagnosis present

## 2022-06-29 DIAGNOSIS — Z801 Family history of malignant neoplasm of trachea, bronchus and lung: Secondary | ICD-10-CM

## 2022-06-29 DIAGNOSIS — F17290 Nicotine dependence, other tobacco product, uncomplicated: Secondary | ICD-10-CM | POA: Diagnosis present

## 2022-06-29 DIAGNOSIS — Z8249 Family history of ischemic heart disease and other diseases of the circulatory system: Secondary | ICD-10-CM

## 2022-06-29 DIAGNOSIS — F3164 Bipolar disorder, current episode mixed, severe, with psychotic features: Secondary | ICD-10-CM | POA: Diagnosis not present

## 2022-06-29 DIAGNOSIS — F29 Unspecified psychosis not due to a substance or known physiological condition: Secondary | ICD-10-CM

## 2022-06-29 LAB — URINE DRUG SCREEN, QUALITATIVE (ARMC ONLY)
Amphetamines, Ur Screen: NOT DETECTED
Barbiturates, Ur Screen: NOT DETECTED
Benzodiazepine, Ur Scrn: NOT DETECTED
Cannabinoid 50 Ng, Ur ~~LOC~~: NOT DETECTED
Cocaine Metabolite,Ur ~~LOC~~: NOT DETECTED
MDMA (Ecstasy)Ur Screen: NOT DETECTED
Methadone Scn, Ur: NOT DETECTED
Opiate, Ur Screen: NOT DETECTED
Phencyclidine (PCP) Ur S: NOT DETECTED
Tricyclic, Ur Screen: NOT DETECTED

## 2022-06-29 LAB — CBC
HCT: 42.5 % (ref 36.0–46.0)
Hemoglobin: 13.6 g/dL (ref 12.0–15.0)
MCH: 29.2 pg (ref 26.0–34.0)
MCHC: 32 g/dL (ref 30.0–36.0)
MCV: 91.4 fL (ref 80.0–100.0)
Platelets: 247 10*3/uL (ref 150–400)
RBC: 4.65 MIL/uL (ref 3.87–5.11)
RDW: 12.6 % (ref 11.5–15.5)
WBC: 11.8 10*3/uL — ABNORMAL HIGH (ref 4.0–10.5)
nRBC: 0 % (ref 0.0–0.2)

## 2022-06-29 LAB — COMPREHENSIVE METABOLIC PANEL
ALT: 12 U/L (ref 0–44)
AST: 8 U/L — ABNORMAL LOW (ref 15–41)
Albumin: 4 g/dL (ref 3.5–5.0)
Alkaline Phosphatase: 62 U/L (ref 38–126)
Anion gap: 6 (ref 5–15)
BUN: 14 mg/dL (ref 6–20)
CO2: 24 mmol/L (ref 22–32)
Calcium: 9 mg/dL (ref 8.9–10.3)
Chloride: 110 mmol/L (ref 98–111)
Creatinine, Ser: 0.66 mg/dL (ref 0.44–1.00)
GFR, Estimated: >60 mL/min (ref >60)
Glucose, Bld: 106 mg/dL — ABNORMAL HIGH (ref 70–99)
Potassium: 3.7 mmol/L (ref 3.5–5.1)
Sodium: 140 mmol/L (ref 135–145)
Total Bilirubin: 0.7 mg/dL (ref 0.3–1.2)
Total Protein: 6.9 g/dL (ref 6.5–8.1)

## 2022-06-29 LAB — SALICYLATE LEVEL: Salicylate Lvl: <7 mg/dL — ABNORMAL LOW (ref 7.0–30.0)

## 2022-06-29 LAB — PREGNANCY, URINE: Preg Test, Ur: NEGATIVE

## 2022-06-29 LAB — ACETAMINOPHEN LEVEL: Acetaminophen (Tylenol), Serum: <10 ug/mL — ABNORMAL LOW (ref 10–30)

## 2022-06-29 LAB — ETHANOL: Alcohol, Ethyl (B): <10 mg/dL (ref <10)

## 2022-06-29 LAB — SARS CORONAVIRUS 2 BY RT PCR: SARS Coronavirus 2 by RT PCR: NEGATIVE

## 2022-06-29 MED ORDER — HYDROXYZINE HCL 25 MG PO TABS
25.0000 mg | ORAL_TABLET | Freq: Three times a day (TID) | ORAL | Status: DC | PRN
  Administered 2022-07-01 – 2022-07-28 (×4): 25 mg via ORAL
  Filled 2022-06-29 (×5): qty 1

## 2022-06-29 MED ORDER — MAGNESIUM HYDROXIDE 400 MG/5ML PO SUSP
30.0000 mL | Freq: Every day | ORAL | Status: DC | PRN
  Administered 2022-07-27: 30 mL via ORAL
  Filled 2022-06-29: qty 30

## 2022-06-29 MED ORDER — ACETAMINOPHEN 325 MG PO TABS
650.0000 mg | ORAL_TABLET | Freq: Four times a day (QID) | ORAL | Status: DC | PRN
  Administered 2022-07-10 – 2022-07-29 (×4): 650 mg via ORAL
  Filled 2022-06-29 (×4): qty 2

## 2022-06-29 MED ORDER — ALUM & MAG HYDROXIDE-SIMETH 200-200-20 MG/5ML PO SUSP: 30.0000 mL | ORAL | Status: DC | PRN

## 2022-06-29 NOTE — ED Notes (Addendum)
Breakfast refused. Requesting to sleep. Lights dimmed for comfort. Noncooperative with psych assessment.

## 2022-06-29 NOTE — ED Notes (Signed)
Report received from Seth Bake, Conservation officer, nature. Patient alert and oriented, warm and dry, in no acute distress. Patient denies SI, HI, AVH and pain. Patient made aware of Q15 minute rounds and security cameras for their safety. Patient instructed to come to this nurse with needs or concerns.

## 2022-06-29 NOTE — BH Assessment (Signed)
Patient is to be admitted to [facility] by Psychiatric Nurse Practitioner  Sharon Mt .  Attending Physician will be Dr.  Weber Cooks .   Patient has been assigned to room 306, by [facility] Charge Nurse Grayland Ormond.   Intake Paper Work has been signed and placed on patient chart.  ER staff is aware of the admission: Glenda, ER Secretary   Dr. Ellender Hose, ER MD  Gerald Stabs, Patient's Nurse  Helene Kelp, Patient Access.   Pt can be transported ASAP.

## 2022-06-29 NOTE — ED Triage Notes (Signed)
Arrives via [employer] under IVC paperwork for ED evaluation.  Patient is awake and alert, calm and cooperative. NAD
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
## 2022-06-29 NOTE — Consult Note (Signed)
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 I want food. I don't want that food. I'm retarded, let's leave it at that."    Autumn [name] is a 46 y.o. female patient admitted with psychotic behaviors.  HPI:  Patient presents to ED via BPD under IVC. She was naked in the street, saying people were writing a book about her.   On evaluation, patient is not speaking in linear sentences. She is tangential and disorganized. Refuses to answer questions. States "I need to go night night."     Past Psychiatric History: substance abuse; bipolar  Risk to Self:   Risk to Others:   Prior Inpatient Therapy:   Prior Outpatient Therapy:    Past Medical History:  Past Medical History:  Diagnosis Date   Allergy    Seasonal, Ultram (seizures)   Anemia 2005   Bipolar 1 disorder ([place])    Glaucoma    Seizures ([place]) 2008    Past Surgical History:  Procedure Laterality Date   BREAST EXCISIONAL BIOPSY Right    x 2   CESAREAN SECTION     x 2   LEEP  2000   Family History:  Family History  Problem Relation Age of Onset   Hypertension Mother    Hyperlipidemia Mother    Asthma Mother    Parkinson's disease Father    Lung cancer Maternal Grandmother    Other Paternal Grandfather 34       Cardiac arrest   Heart disease Paternal Grandfather    Tourette syndrome Daughter    Family Psychiatric  History:  Social History:  Social History   Substance and Sexual Activity  Alcohol Use Not Currently   Alcohol/week: 8.0 standard drinks of alcohol   Types: 8 Shots of liquor per week   Comment: last use 03/30/22 4x/yr     Social History   Substance and Sexual Activity  Drug  Use Yes   Frequency: 1.0 times per week   Types: Marijuana, Cocaine, Heroin, Methamphetamines, MDMA (Ecstacy)   Comment: last use yesterday    Social History   Socioeconomic History   Marital status: Legally Separated    Spouse name: Not on file   Number of children: Not on file   Years of education: Not on file   Highest education level: Not on file  Occupational History   Not on file  Tobacco Use   Smoking status: Every Day    Packs/day: 0.50    Years: 9.00    Total pack years: 4.50    Types: E-cigarettes, Cigarettes, Cigars   Smokeless tobacco: Former    Types: Nurse, children's Use: Some days   Substances: Nicotine  Substance and Sexual Activity   Alcohol use: Not Currently    Alcohol/week: 8.0 standard drinks of alcohol    Types: 8 Shots of liquor per week    Comment: last use 03/30/22 4x/yr   Drug use: Yes    Frequency: 1.0 times per week    Types: Marijuana, Cocaine, Heroin, Methamphetamines, MDMA (Ecstacy)    Comment: last use yesterday   Sexual activity: Yes    Partners: Male    Birth control/protection: None  Other Topics Concern   Not on  file  Social History Narrative   Not on file   Social Determinants of Health   Financial Resource Strain: Not on file  Food Insecurity: Not on file  Transportation Needs: Not on file  Physical Activity: Not on file  Stress: Not on file  Social Connections: Not on file   Additional Social History:    Allergies:   Allergies  Allergen Reactions   Haldol [Haloperidol Lactate]     Patient states that she gets stiff muscles when taking Haldol   Ultram [Tramadol] Other (See Comments)    Seizures   Ziprasidone Hcl     Other reaction(s): Other (See Comments) PER PT WHEN SHE TAKES THIS SHE CANT MOVE HER NECK    Labs:  Results for orders placed or performed during the hospital encounter of 06/29/22 (from the past 48 hour(s))  Comprehensive metabolic panel     Status: Abnormal   Collection Time: 06/29/22   8:13 AM  Result Value Ref Range   Sodium 140 135 - 145 mmol/L   Potassium 3.7 3.5 - 5.1 mmol/L   Chloride 110 98 - 111 mmol/L   CO2 24 22 - 32 mmol/L   Glucose, Bld 106 (H) 70 - 99 mg/dL    Comment: Glucose reference range applies only to samples taken after fasting for at least 8 hours.   BUN 14 6 - 20 mg/dL   Creatinine, Ser 0.66 0.44 - 1.00 mg/dL   Calcium 9.0 8.9 - 10.3 mg/dL   Total Protein 6.9 6.5 - 8.1 g/dL   Albumin 4.0 3.5 - 5.0 g/dL   AST 8 (L) 15 - 41 U/L   ALT 12 0 - 44 U/L   Alkaline Phosphatase 62 38 - 126 U/L   Total Bilirubin 0.7 0.3 - 1.2 mg/dL   GFR, Estimated >60 >60 mL/min    Comment: (NOTE) Calculated using the CKD-EPI Creatinine Equation (2021)    Anion gap 6 5 - 15    Comment: Performed at [facility], [address] [place], [place] [address]  Ethanol     Status: None   Collection Time: 06/29/22  8:13 AM  Result Value Ref Range   Alcohol, Ethyl (B) <10 <10 mg/dL    Comment: (NOTE) Lowest detectable limit for serum alcohol is 10 mg/dL.  For medical purposes only. Performed at [facility], [address] [place], [place] [address]   Salicylate level     Status: Abnormal   Collection Time: 06/29/22  8:13 AM  Result Value Ref Range   Salicylate Lvl <3.6 (L) 7.0 - 30.0 mg/dL    Comment: Performed at [facility], [address] [place], [place] [address]  Acetaminophen level     Status: Abnormal   Collection Time: 06/29/22  8:13 AM  Result Value Ref Range   Acetaminophen (Tylenol), Serum <10 (L) 10 - 30 ug/mL    Comment: (NOTE) Therapeutic concentrations vary significantly. A range of 10-30 ug/mL  may be an effective concentration for many patients. However, some  are best treated at concentrations outside of this range. Acetaminophen concentrations >150 ug/mL at 4 hours after ingestion  and >50 ug/mL at 12 hours after ingestion are often associated with  toxic reactions.  Performed at [facility], [address] [place], [place] [address]   cbc     Status: Abnormal   Collection Time: 06/29/22  8:13 AM  Result Value Ref Range   WBC 11.8 (H) 4.0 - 10.5 K/uL   RBC  4.65 3.87 - 5.11 MIL/uL   Hemoglobin 13.6 12.0 - 15.0 g/dL   HCT 42.5 36.0 - 46.0 %   MCV 91.4 80.0 - 100.0 fL   MCH 29.2 26.0 - 34.0 pg   MCHC 32.0 30.0 - 36.0 g/dL   RDW 12.6 11.5 - 15.5 %   Platelets 247 150 - 400 K/uL   nRBC 0.0 0.0 - 0.2 %    Comment: Performed at [facility], [address] [place], [place] [address]  SARS Coronavirus 2 by RT PCR (hospital order, performed in [facility] hospital lab) *cepheid single result test* Anterior Nasal Swab     Status: None   Collection Time: 06/29/22 10:16 AM   Specimen: Anterior Nasal Swab  Result Value Ref Range   SARS Coronavirus 2 by RT PCR NEGATIVE NEGATIVE    Comment: (NOTE) SARS-CoV-2 target nucleic acids are NOT DETECTED.  The SARS-CoV-2 RNA is generally detectable in upper and lower respiratory specimens during the acute phase of infection. The lowest concentration of SARS-CoV-2 viral copies this assay can detect is 250 copies / mL. A negative result does not preclude SARS-CoV-2 infection and should not be used as the sole basis for treatment or other patient management decisions.  A negative result may occur with improper specimen collection / handling, submission of specimen other than nasopharyngeal swab, presence of viral mutation(s) within the areas targeted by this assay, and inadequate number of viral copies (<250 copies / mL). A negative result must be combined with clinical observations, patient history, and epidemiological information.  Fact Sheet for Patients:   https://www.patel.info/  Fact Sheet for Healthcare Providers: https://hall.com/  This test is not yet approved or  cleared by the [place] FDA and has been authorized for detection and/or diagnosis of SARS-CoV-2 by FDA under  an Emergency Use Authorization (EUA).  This EUA will remain in effect (meaning this test can be used) for the duration of the COVID-19 declaration under Section 564(b)(1) of the Act, 21 U.S.C. section 360bbb-3(b)(1), unless the authorization is terminated or revoked sooner.  Performed at [facility], [address] [place], [place] [address]     No current facility-administered medications for this encounter.   Current Outpatient Medications  Medication Sig Dispense Refill   ARIPiprazole (ABILIFY) 15 MG tablet Take 1 tablet (15 mg total) by mouth daily. (Patient not taking: Reported on 05/29/2022) 30 tablet 0   divalproex (DEPAKOTE) 250 MG DR tablet Take 2 tablets (500 mg total) by mouth every 12 (twelve) hours. (Patient not taking: Reported on 05/29/2022) 120 tablet 0   lithium carbonate (LITHOBID) 300 MG CR tablet Take 2 tablets (600 mg total) by mouth every 12 (twelve) hours. (Patient not taking: Reported on 05/29/2022) 120 tablet 0   norethindrone (MICRONOR) 0.35 MG tablet Take 1 tablet (0.35 mg total) by mouth daily. (Patient not taking: Reported on 05/20/2022) 28 tablet 13   QUEtiapine (SEROQUEL) 200 MG tablet Take 1 tablet (200 mg total) by mouth at bedtime. (Patient not taking: Reported on 05/29/2022) 30 tablet 0    Musculoskeletal: Strength & Muscle Tone: within normal limits Gait & Station: normal Patient leans: N/A   Psychiatric Specialty Exam:  Presentation  General Appearance: Bizarre  Eye Contact:Fair  Speech:-- (non-linear)  Speech Volume:Normal  Handedness:Right   Mood and Affect  Mood:Dysphoric  Affect:Blunt   Thought Process  Thought Processes:Disorganized  Descriptions of Associations:Loose  Orientation:Partial  Thought Content:Illogical  History of Schizophrenia/Schizoaffective disorder:Yes  Duration of Psychotic Symptoms:Greater than six months  Hallucinations:Hallucinations: -- Pincus Badder)  Ideas of Reference:-- Pincus Badder)  Suicidal  Thoughts:Suicidal Thoughts: -- (did not answer)  Homicidal Thoughts:Homicidal Thoughts: -- (did not answer)   Sensorium  Memory:Immediate Poor  Judgment:Impaired  Insight:Lacking   Executive Functions  Concentration:Poor  Attention Span:Poor  Recall:Poor  Fund of Knowledge:Poor  Language:Poor   Psychomotor Activity  Psychomotor Activity:Psychomotor Activity: Normal   Assets  Assets:Resilience   Sleep  Sleep:Sleep: Fair   Physical Exam: Physical Exam Vitals and nursing note reviewed.  HENT:     Head: Normocephalic.     Nose: No congestion or rhinorrhea.  Eyes:     General:        Right eye: No discharge.        Left eye: No discharge.  Pulmonary:     Effort: Pulmonary effort is normal.  Musculoskeletal:        General: Normal range of motion.     Cervical back: Normal range of motion.  Neurological:     Mental Status: She is alert.  Psychiatric:        Attention and Perception: She is inattentive.        Mood and Affect: Affect is inappropriate.        Speech: Speech is tangential.        Behavior: Behavior is uncooperative.        Cognition and Memory: Cognition is impaired.        Judgment: Judgment is impulsive.    Review of Systems  Reason unable to perform ROS: patient uncooperative.  Respiratory: Negative.    Musculoskeletal: Negative.   Psychiatric/Behavioral:  Positive for substance abuse (hx of).    Blood pressure 130/65, pulse (!) 57, temperature 98.6 F (37 C), temperature source Oral, resp. rate 16, height '5\' 1"'$  (1.549 m), weight 45 kg, last menstrual period 05/17/2022, SpO2 100 %. Body mass index is 18.74 kg/m.  Treatment Plan Summary: Medication management and Plan refer for inpatient psychiatric hospitalization   Disposition: Recommend psychiatric Inpatient admission when medically cleared.  Sherlon Handing, NP 06/29/2022 11:07 AM

## 2022-06-29 NOTE — ED Notes (Signed)
Pt moved to [facility] from quad. ED tech had attempted to get urine sample when pt voided prior to being moved. Pt refused and states she is not pregnant. Pt speaking loudly upon entering BHU and asking when she will be moved downstairs. Pt informed she could when she provides a urine sample. Pt pulled down her pants, squatted over the floor and states  "I 'll pee for you". Small amount of urine noted on floor. Pt redirected and encouraged to use restroom to void. Pt oriented to department, security cameras,and safety checks.

## 2022-06-29 NOTE — ED Notes (Signed)
Pt seen standing in doorway of locked three bed unit of ED BHU watching other the patients. Pt currently does not have any clothing on and is asking when she is going downstairs.

## 2022-06-29 NOTE — ED Notes (Signed)
Report to Seth Bake, Therapist, sports. Taken to [facility] by EDT via wheelchair. Security escort.

## 2022-06-29 NOTE — ED Notes (Signed)
Dinner tray given to PT.

## 2022-06-29 NOTE — ED Notes (Signed)
IVC  CONSULT  DONE  PENDING  PLACEMENT  PT  MOVED  TO  BHU  UNIT

## 2022-06-29 NOTE — ED Notes (Signed)
Pt refused snack 

## 2022-06-29 NOTE — BH Assessment (Signed)
Patient is under review with [facility]. Awaiting accepting details.

## 2022-06-29 NOTE — ED Notes (Signed)
Pt belongings:   1 black tank top 1 pair of red shorts
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
## 2022-06-29 NOTE — ED Notes (Signed)
Pt throwing food across unit and taking off magnetic doors from doorway and slamming on ground, knocking on windows and intimidating another pt in area, not intentionally. Pt has now put on her clothes and repeatedly asks to go down stairs. Area is cleared of items to prevent further behavior. She now lays down in bed

## 2022-06-29 NOTE — ED Notes (Signed)
Lunch provided.

## 2022-06-29 NOTE — ED Notes (Signed)
Pt standing in doorway of locked 3 bed until of ED BHU knocking. Asking when she will be going downstairs. Told to take a step away from doorway and encouraged to put on clothing. Pt uncooperative at this time.

## 2022-06-30 ENCOUNTER — Encounter: Payer: Self-pay | Admitting: Psychiatry

## 2022-06-30 DIAGNOSIS — F3164 Bipolar disorder, current episode mixed, severe, with psychotic features: Secondary | ICD-10-CM | POA: Diagnosis not present

## 2022-06-30 MED ORDER — ARIPIPRAZOLE 5 MG PO TABS
15.0000 mg | ORAL_TABLET | Freq: Every day | ORAL | Status: DC
  Administered 2022-07-01 – 2022-07-16 (×16): 15 mg via ORAL
  Filled 2022-06-30 (×17): qty 1

## 2022-06-30 MED ORDER — LITHIUM CARBONATE ER 300 MG PO TBCR
600.0000 mg | EXTENDED_RELEASE_TABLET | Freq: Two times a day (BID) | ORAL | Status: DC
  Administered 2022-07-01 – 2022-07-09 (×16): 600 mg via ORAL
  Filled 2022-06-30 (×17): qty 2

## 2022-06-30 MED ORDER — QUETIAPINE FUMARATE 200 MG PO TABS
200.0000 mg | ORAL_TABLET | Freq: Every day | ORAL | Status: DC
  Filled 2022-06-30: qty 1

## 2022-06-30 MED ORDER — NICOTINE 14 MG/24HR TD PT24
14.0000 mg | MEDICATED_PATCH | Freq: Every day | TRANSDERMAL | Status: DC
  Administered 2022-07-05: 14 mg via TRANSDERMAL
  Filled 2022-06-30 (×8): qty 1

## 2022-06-30 MED ORDER — DIVALPROEX SODIUM 500 MG PO DR TAB
500.0000 mg | DELAYED_RELEASE_TABLET | Freq: Two times a day (BID) | ORAL | Status: DC
  Administered 2022-07-01 – 2022-07-04 (×6): 500 mg via ORAL
  Filled 2022-06-30 (×8): qty 1

## 2022-06-30 NOTE — Progress Notes (Signed)
Attempted to approach patient in her room for status check and medication reminder.  Patient in a hostile mood.  Laying the bed with no clothes. She demands that I get out of her room and leave her alone. She refused her medication for now.  Will attempt again at a later time to see if she will be willing to take her meds.  For now she is safe on the unit with q 15 minute safety checks.      C Butler-Nicholson, lPN

## 2022-06-30 NOTE — Plan of Care (Signed)
  Problem: Education: Goal: Knowledge of [place] [facility] Education information/materials will improve Outcome: Progressing Goal: Verbalization of understanding the information provided will improve Outcome: Progressing   Problem: Safety: Goal: Periods of time without injury will increase Outcome: Progressing

## 2022-06-30 NOTE — Plan of Care (Signed)
  Problem: Education: Goal: Knowledge of General Education information will improve Description: Including pain rating scale, medication(s)/side effects and non-pharmacologic comfort measures Outcome: Not Progressing   Problem: Health Behavior/Discharge Planning: Goal: Ability to manage health-related needs will improve Outcome: Not Progressing   Problem: Clinical Measurements: Goal: Ability to maintain clinical measurements within normal limits will improve Outcome: Not Progressing Goal: Will remain free from infection Outcome: Not Progressing Goal: Diagnostic test results will improve Outcome: Not Progressing Goal: Respiratory complications will improve Outcome: Not Progressing Goal: Cardiovascular complication will be avoided Outcome: Not Progressing   Problem: Skin Integrity: Goal: Risk for impaired skin integrity will decrease Outcome: Not Progressing   Problem: Safety: Goal: Ability to remain free from injury will improve Outcome: Not Progressing   Problem: Pain Managment: Goal: General experience of comfort will improve Outcome: Not Progressing   Problem: Elimination: Goal: Will not experience complications related to bowel motility Outcome: Not Progressing Goal: Will not experience complications related to urinary retention Outcome: Not Progressing   Problem: Coping: Goal: Level of anxiety will decrease Outcome: Not Progressing   Problem: Nutrition: Goal: Adequate nutrition will be maintained Outcome: Not Progressing   Problem: Activity: Goal: Risk for activity intolerance will decrease Outcome: Not Progressing

## 2022-06-30 NOTE — BHH Group Notes (Signed)
[facility] Group Notes:  (Nursing/MHT/Case Management/Adjunct)  Date:  06/30/2022  Time:  9:45 PM  Type of Therapy:   Wrap up  Participation Level:  Did Not Attend   Summary of Progress/Problems:  Autumn [name] 06/30/2022, 9:45 PM

## 2022-06-30 NOTE — Progress Notes (Signed)
Recreation Therapy Notes   Date: 06/30/2022  Time: 10:20 am    Location: Courtyard    Behavioral response: N/A   Intervention Topic: Wellness   Discussion/Intervention: Patient refused to attend group.   Clinical Observations/Feedback:  Patient refused to attend group.    Zyasia Halbleib LRT/CTRS        Chastin Riesgo 06/30/2022 12:24 PM

## 2022-06-30 NOTE — H&P (Signed)
Psychiatric Admission Assessment Adult  Patient Identification: Autumn [name] MRN:  [id number] Date of Evaluation:  06/30/2022 Chief Complaint:  Bipolar affective disorder [facility]) [F31.9] Principal Diagnosis: Bipolar affective disorder ([place]) Diagnosis:  Principal Problem:   Bipolar affective disorder ([place])  History of Present Illness: Patient seen and chart reviewed.  Patient is well-known to [place] from many prior encounters.  46 year old woman with bipolar disorder and a history of substance abuse problems who was brought to the emergency room by police.  They state that they found the patient walking around naked outside at a bus station talking out of her head talking about how people are writing a book about her and burning the [place] down.  Patient was also disorganized but uncooperative in the emergency room.  I attempted to speak to her several times today and each time she told me she needed to sleep and I needed to leave her alone for now.  Said a few other things that made no sense at all.  Did not appear to be in any physical distress just sleeping all day.  Drug screen amazingly is completely negative.  Currently a little hypotensive otherwise does not seem to be in any physical problem. Associated Signs/Symptoms: Depression Symptoms:  psychomotor retardation, Duration of Depression Symptoms: Greater than two weeks  (Hypo) Manic Symptoms:  Irritable Mood, Labiality of Mood, Anxiety Symptoms:  Excessive Worry, Psychotic Symptoms:  Paranoia, PTSD Symptoms: Negative Total Time spent with patient: 45 minutes  Past Psychiatric History: Patient has a long history of bipolar or schizoaffective disorder and substance abuse problems.  Frequent admissions in the past often involved a combination of substance use and psychotic mood disorder.  Last seen here at the hospital in March of this year.  Was discharged on a combination of antipsychotics and mood stabilizers with the plan to follow up  with outpatient treatment.  Patient has often in the past wound up homeless when she is off of her medicine.  Has made suicidal statements in the past but has not tended to act out directly to harm self.  Rarely aggressive physically.  Poor self-care and poor judgment when psychotic  Is the patient at risk to self? Yes.    Has the patient been a risk to self in the past 6 months? Yes.    Has the patient been a risk to self within the distant past? Yes.    Is the patient a risk to others? No.  Has the patient been a risk to others in the past 6 months? No.  Has the patient been a risk to others within the distant past? No.   [place] Scale:  [facility] Admission (Current) from 06/29/2022 in [place] Most recent reading at 06/29/2022  9:15 PM ED from 06/29/2022 in [place] Most recent reading at 06/29/2022  8:08 AM ED from 06/08/2022 in [facility] Most recent reading at 06/08/2022  8:57 PM  C-SSRS RISK CATEGORY No Risk No Risk No Risk        Prior Inpatient Therapy:   Prior Outpatient Therapy:    Alcohol Screening: 1. How often do you have a drink containing alcohol?: Never 2. How many drinks containing alcohol do you have on a typical day when you are drinking?: 1 or 2 3. How often do you have six or more drinks on one occasion?: Never AUDIT-C Score: 0 4. How often during the last year have you found that you were  not able to stop drinking once you had started?: Never 5. How often during the last year have you failed to do what was normally expected from you because of drinking?: Never 6. How often during the last year have you needed a first drink in the morning to get yourself going after a heavy drinking session?: Never 7. How often during the last year have you had a feeling of guilt of remorse after drinking?: Never 8. How often during the last year have you been unable to  remember what happened the night before because you had been drinking?: Never 9. Have you or someone else been injured as a result of your drinking?: No 10. Has a relative or friend or a doctor or another health worker been concerned about your drinking or suggested you cut down?: No Alcohol Use Disorder Identification Test Final Score (AUDIT): 0 Substance Abuse History in the last 12 months:  Yes.   Consequences of Substance Abuse: Although her drug screen this time was negative prior to that at least the last time we saw her she was still actively abusing multiple different substances which clearly contribute to the worsening mental health problems Previous Psychotropic Medications: Yes  Psychological Evaluations: Yes  Past Medical History:  Past Medical History:  Diagnosis Date   Allergy    Seasonal, Ultram (seizures)   Anemia 2005   Bipolar 1 disorder ([place])    Glaucoma    Seizures ([place]) 2008    Past Surgical History:  Procedure Laterality Date   BREAST EXCISIONAL BIOPSY Right    x 2   CESAREAN SECTION     x 2   LEEP  2000   Family History:  Family History  Problem Relation Age of Onset   Hypertension Mother    Hyperlipidemia Mother    Asthma Mother    Parkinson's disease Father    Lung cancer Maternal Grandmother    Other Paternal Grandfather 68       Cardiac arrest   Heart disease Paternal Grandfather    Tourette syndrome Daughter    Family Psychiatric  History: None reported Tobacco Screening:   Social History:  Social History   Substance and Sexual Activity  Alcohol Use Not Currently   Alcohol/week: 8.0 standard drinks of alcohol   Types: 8 Shots of liquor per week   Comment: last use 03/30/22 4x/yr     Social History   Substance and Sexual Activity  Drug Use Yes   Frequency: 1.0 times per week   Types: Marijuana, Cocaine, Heroin, Methamphetamines, MDMA (Ecstacy)   Comment: last use yesterday    Additional Social History:                            Allergies:   Allergies  Allergen Reactions   Haldol [Haloperidol Lactate]     Patient states that she gets stiff muscles when taking Haldol   Ultram [Tramadol] Other (See Comments)    Seizures   Ziprasidone Hcl     Other reaction(s): Other (See Comments) PER PT WHEN SHE TAKES THIS SHE CANT MOVE HER NECK   Lab Results:  Results for orders placed or performed during the hospital encounter of 06/29/22 (from the past 48 hour(s))  Comprehensive metabolic panel     Status: Abnormal   Collection Time: 06/29/22  8:13 AM  Result Value Ref Range   Sodium 140 135 - 145 mmol/L   Potassium 3.7 3.5 - 5.1 mmol/L  Chloride 110 98 - 111 mmol/L   CO2 24 22 - 32 mmol/L   Glucose, Bld 106 (H) 70 - 99 mg/dL    Comment: Glucose reference range applies only to samples taken after fasting for at least 8 hours.   BUN 14 6 - 20 mg/dL   Creatinine, Ser 0.66 0.44 - 1.00 mg/dL   Calcium 9.0 8.9 - 10.3 mg/dL   Total Protein 6.9 6.5 - 8.1 g/dL   Albumin 4.0 3.5 - 5.0 g/dL   AST 8 (L) 15 - 41 U/L   ALT 12 0 - 44 U/L   Alkaline Phosphatase 62 38 - 126 U/L   Total Bilirubin 0.7 0.3 - 1.2 mg/dL   GFR, Estimated >60 >60 mL/min    Comment: (NOTE) Calculated using the CKD-EPI Creatinine Equation (2021)    Anion gap 6 5 - 15    Comment: Performed at [facility], [address] [place], [place] [address]  Ethanol     Status: None   Collection Time: 06/29/22  8:13 AM  Result Value Ref Range   Alcohol, Ethyl (B) <10 <10 mg/dL    Comment: (NOTE) Lowest detectable limit for serum alcohol is 10 mg/dL.  For medical purposes only. Performed at [facility], [address] [place], [place] [address]   Salicylate level     Status: Abnormal   Collection Time: 06/29/22  8:13 AM  Result Value Ref Range   Salicylate Lvl <2.9 (L) 7.0 - 30.0 mg/dL    Comment: Performed at [facility], [address] [place], [place] [address]  Acetaminophen level     Status: Abnormal    Collection Time: 06/29/22  8:13 AM  Result Value Ref Range   Acetaminophen (Tylenol), Serum <10 (L) 10 - 30 ug/mL    Comment: (NOTE) Therapeutic concentrations vary significantly. A range of 10-30 ug/mL  may be an effective concentration for many patients. However, some  are best treated at concentrations outside of this range. Acetaminophen concentrations >150 ug/mL at 4 hours after ingestion  and >50 ug/mL at 12 hours after ingestion are often associated with  toxic reactions.  Performed at [facility], [address] [place], [place] [address]   cbc     Status: Abnormal   Collection Time: 06/29/22  8:13 AM  Result Value Ref Range   WBC 11.8 (H) 4.0 - 10.5 K/uL   RBC 4.65 3.87 - 5.11 MIL/uL   Hemoglobin 13.6 12.0 - 15.0 g/dL   HCT 42.5 36.0 - 46.0 %   MCV 91.4 80.0 - 100.0 fL   MCH 29.2 26.0 - 34.0 pg   MCHC 32.0 30.0 - 36.0 g/dL   RDW 12.6 11.5 - 15.5 %   Platelets 247 150 - 400 K/uL   nRBC 0.0 0.0 - 0.2 %    Comment: Performed at [facility], [address], [place], [place] [address]  Pregnancy, urine     Status: None   Collection Time: 06/29/22  8:42 AM  Result Value Ref Range   Preg Test, Ur NEGATIVE NEGATIVE    Comment: Performed at [facility], [address], [place], [place] [address]  SARS Coronavirus 2 by RT PCR (hospital order, performed in [facility] hospital lab) *cepheid single result test* Anterior Nasal Swab     Status: None   Collection Time: 06/29/22 10:16 AM   Specimen: Anterior Nasal Swab  Result Value Ref Range   SARS Coronavirus 2 by RT PCR NEGATIVE NEGATIVE  Comment: (NOTE) SARS-CoV-2 target nucleic acids are NOT DETECTED.  The SARS-CoV-2 RNA is generally detectable in upper and lower respiratory specimens during the acute phase of infection. The lowest concentration of SARS-CoV-2 viral copies this assay can detect is 250 copies / mL. A negative result does not preclude SARS-CoV-2 infection and should not be  used as the sole basis for treatment or other patient management decisions.  A negative result may occur with improper specimen collection / handling, submission of specimen other than nasopharyngeal swab, presence of viral mutation(s) within the areas targeted by this assay, and inadequate number of viral copies (<250 copies / mL). A negative result must be combined with clinical observations, patient history, and epidemiological information.  Fact Sheet for Patients:   https://www.patel.info/  Fact Sheet for Healthcare Providers: https://hall.com/  This test is not yet approved or  cleared by the [place] FDA and has been authorized for detection and/or diagnosis of SARS-CoV-2 by FDA under an Emergency Use Authorization (EUA).  This EUA will remain in effect (meaning this test can be used) for the duration of the COVID-19 declaration under Section 564(b)(1) of the Act, 21 U.S.C. section 360bbb-3(b)(1), unless the authorization is terminated or revoked sooner.  Performed at [facility], [address] [place], [place] [address]   Urine Drug Screen, Qualitative     Status: None   Collection Time: 06/29/22  4:59 PM  Result Value Ref Range   Tricyclic, Ur Screen NONE DETECTED NONE DETECTED   Amphetamines, Ur Screen NONE DETECTED NONE DETECTED   MDMA (Ecstasy)Ur Screen NONE DETECTED NONE DETECTED   Cocaine Metabolite,Ur [place] NONE DETECTED NONE DETECTED   Opiate, Ur Screen NONE DETECTED NONE DETECTED   Phencyclidine (PCP) Ur S NONE DETECTED NONE DETECTED   Cannabinoid 50 Ng, Ur [place] NONE DETECTED NONE DETECTED   Barbiturates, Ur Screen NONE DETECTED NONE DETECTED   Benzodiazepine, Ur Scrn NONE DETECTED NONE DETECTED   Methadone Scn, Ur NONE DETECTED NONE DETECTED    Comment: (NOTE) Tricyclics + metabolites, urine    Cutoff 1000 ng/mL Amphetamines + metabolites, urine  Cutoff 1000 ng/mL MDMA (Ecstasy), urine               Cutoff 500 ng/mL Cocaine Metabolite, urine          Cutoff 300 ng/mL Opiate + metabolites, urine        Cutoff 300 ng/mL Phencyclidine (PCP), urine         Cutoff 25 ng/mL Cannabinoid, urine                 Cutoff 50 ng/mL Barbiturates + metabolites, urine  Cutoff 200 ng/mL Benzodiazepine, urine              Cutoff 200 ng/mL Methadone, urine                   Cutoff 300 ng/mL  The urine drug screen provides only a preliminary, unconfirmed analytical test result and should not be used for non-medical purposes. Clinical consideration and professional judgment should be applied to any positive drug screen result due to possible interfering substances. A more specific alternate chemical method must be used in order to obtain a confirmed analytical result. Gas chromatography / mass spectrometry (GC/MS) is the preferred confirm atory method. Performed at [facility], [address] [place], [place] [address]     Blood Alcohol level:  Lab Results  Component Value Date   [facility] <10 06/29/2022   ETH <10  23/55/7322    Metabolic Disorder Labs:  Lab Results  Component Value Date   HGBA1C 4.9 12/25/2021   MPG 93.93 12/25/2021   MPG 114 06/02/2021   No results found for: "PROLACTIN" Lab Results  Component Value Date   CHOL 160 12/25/2021   TRIG 95 12/25/2021   HDL 58 12/25/2021   CHOLHDL 2.8 12/25/2021   VLDL 19 12/25/2021   LDLCALC 83 12/25/2021   LDLCALC 63 06/01/2021    Current Medications: Current Facility-Administered Medications  Medication Dose Route Frequency Provider Last Rate Last Admin   acetaminophen (TYLENOL) tablet 650 mg  650 mg Oral Q6H PRN Sherlon Handing, NP       alum & mag hydroxide-simeth (MAALOX/MYLANTA) 200-200-20 MG/5ML suspension 30 mL  30 mL Oral Q4H PRN Barthold, Louise F, NP       ARIPiprazole (ABILIFY) tablet 15 mg  15 mg Oral Daily Okechukwu Regnier T, MD       divalproex (DEPAKOTE) DR tablet 500 mg  500 mg Oral Q12H Labarron Durnin, [name], MD        hydrOXYzine (ATARAX) tablet 25 mg  25 mg Oral TID PRN Sherlon Handing, NP       lithium carbonate (LITHOBID) CR tablet 600 mg  600 mg Oral Q12H Tamasha Laplante T, MD       magnesium hydroxide (MILK OF MAGNESIA) suspension 30 mL  30 mL Oral Daily PRN Waldon Merl F, NP       nicotine (NICODERM CQ - dosed in mg/24 hours) patch 14 mg  14 mg Transdermal Daily Pranshu Lyster T, MD       QUEtiapine (SEROQUEL) tablet 200 mg  200 mg Oral QHS Demitrus Francisco T, MD       PTA Medications: Medications Prior to Admission  Medication Sig Dispense Refill Last Dose   ARIPiprazole (ABILIFY) 15 MG tablet Take 1 tablet (15 mg total) by mouth daily. (Patient not taking: Reported on 05/29/2022) 30 tablet 0    divalproex (DEPAKOTE) 250 MG DR tablet Take 2 tablets (500 mg total) by mouth every 12 (twelve) hours. (Patient not taking: Reported on 05/29/2022) 120 tablet 0    lithium carbonate (LITHOBID) 300 MG CR tablet Take 2 tablets (600 mg total) by mouth every 12 (twelve) hours. (Patient not taking: Reported on 05/29/2022) 120 tablet 0    norethindrone (MICRONOR) 0.35 MG tablet Take 1 tablet (0.35 mg total) by mouth daily. (Patient not taking: Reported on 05/20/2022) 28 tablet 13    QUEtiapine (SEROQUEL) 200 MG tablet Take 1 tablet (200 mg total) by mouth at bedtime. (Patient not taking: Reported on 05/29/2022) 30 tablet 0     Musculoskeletal: Strength & Muscle Tone: within normal limits Gait & Station: normal Patient leans: N/A            Psychiatric Specialty Exam:  Presentation  General Appearance: Bizarre  Eye Contact:Fair  Speech:-- (non-linear)  Speech Volume:Normal  Handedness:Right   Mood and Affect  Mood:Dysphoric  Affect:Blunt   Thought Process  Thought Processes:Disorganized  Duration of Psychotic Symptoms: Greater than six months  Past Diagnosis of Schizophrenia or Psychoactive disorder: Yes  Descriptions of Associations:Loose  Orientation:Partial  Thought  Content:Illogical  Hallucinations:Hallucinations: -- Pincus Badder)  Ideas of Reference:-- (uta)  Suicidal Thoughts:Suicidal Thoughts: -- (did not answer)  Homicidal Thoughts:Homicidal Thoughts: -- (did not answer)   Sensorium  Memory:Immediate Poor  Judgment:Impaired  Insight:Lacking   Executive Functions  Concentration:Poor  Attention Span:Poor  Recall:Poor  Fund of Knowledge:Poor  Language:Poor   Psychomotor  Activity  Psychomotor Activity:Psychomotor Activity: Normal   Assets  Assets:Resilience   Sleep  Sleep:Sleep: Fair    Physical Exam: Physical Exam Vitals and nursing note reviewed.  Constitutional:      Appearance: Normal appearance.  HENT:     Head: Normocephalic and atraumatic.     Mouth/Throat:     Pharynx: Oropharynx is clear.  Eyes:     Pupils: Pupils are equal, round, and reactive to light.  Cardiovascular:     Rate and Rhythm: Normal rate and regular rhythm.  Pulmonary:     Effort: Pulmonary effort is normal.     Breath sounds: Normal breath sounds.  Abdominal:     General: Abdomen is flat.     Palpations: Abdomen is soft.  Musculoskeletal:        General: Normal range of motion.  Skin:    General: Skin is warm and dry.  Neurological:     General: No focal deficit present.     Mental Status: She is alert. Mental status is at baseline.  Psychiatric:        Attention and Perception: She is inattentive.        Mood and Affect: Affect is blunt and angry.        Speech: She is noncommunicative.    Review of Systems  Unable to perform ROS: Psychiatric disorder   Height '5\' 1"'$  (1.549 m), weight 45 kg, last menstrual period 05/17/2022. Body mass index is 18.74 kg/m.  Treatment Plan Summary: Medication management and Plan although she is not currently cooperating she does not appear to be delirious.  Just very tired today.  Probably a mixed psychotic depressed episode.  Could involve substances that are just not showing up in the drug  screen.  Recommend restarting medications based on what she was on last time which was a combination of Depakote lithium and Seroquel.  Reviewed labs and check anything that needs to be looked at.  Engage in individual and group therapy.  We tried to have treatment team with her today but she was not able to cooperate but we will continue to work with her as a team to formulate goals.  Observation Level/Precautions:  15 minute checks  Laboratory:  Chemistry Profile  Psychotherapy:    Medications:    Consultations:    Discharge Concerns:    Estimated LOS:  Other:     Physician Treatment Plan for Primary Diagnosis: Bipolar affective disorder ([place]) Long Term Goal(s): Improvement in symptoms so as ready for discharge  Short Term Goals: Ability to demonstrate self-control will improve, Ability to identify and develop effective coping behaviors will improve, Ability to maintain clinical measurements within normal limits will improve, and Compliance with prescribed medications will improve  Physician Treatment Plan for Secondary Diagnosis: Principal Problem:   Bipolar affective disorder ([place])  Long Term Goal(s): Improvement in symptoms so as ready for discharge  Short Term Goals: Ability to identify and develop effective coping behaviors will improve, Ability to maintain clinical measurements within normal limits will improve, and Compliance with prescribed medications will improve  I certify that inpatient services furnished can reasonably be expected to improve the patient's condition.    Alethia Berthold, MD 8/23/20234:31 PM

## 2022-06-30 NOTE — Group Note (Signed)
[facility] LCSW Group Therapy Note   Group Date: 06/30/2022 Start Time: 1300 End Time: 1400   Type of Therapy/Topic:  Group Therapy:  Emotion Regulation  Participation Level:  Did Not Attend    Description of Group:    The purpose of this group is to assist patients in learning to regulate negative emotions and experience positive emotions. Patients will be guided to discuss ways in which they have been vulnerable to their negative emotions. These vulnerabilities will be juxtaposed with experiences of positive emotions or situations, and patients challenged to use positive emotions to combat negative ones. Special emphasis will be placed on coping with negative emotions in conflict situations, and patients will process healthy conflict resolution skills.  Therapeutic Goals: Patient will identify two positive emotions or experiences to reflect on in order to balance out negative emotions:  Patient will label two or more emotions that they find the most difficult to experience:  Patient will be able to demonstrate positive conflict resolution skills through discussion or role plays:   Summary of Patient Progress: X   Therapeutic Modalities:   Cognitive Behavioral Therapy Feelings Identification Dialectical Behavioral Therapy   Shirl Harris, LCSW
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
## 2022-06-30 NOTE — Progress Notes (Signed)
D: Patient alert and oriented. Patient denies pain. Patient denies anxiety and depression. Patient denies SI/HI/AVH. Patient refused 0800 nicotine patch. Patient refused breakfast and isolative to room during the morning. Patient refused dinner and scheduled 1700 medications.  A:   Support and encouragement provided to patient.  Q15 minute safety checks maintained.   R:  No adverse drug reactions noted. Patient remains safe on the unit at this time.

## 2022-06-30 NOTE — BH IP Treatment Plan (Signed)
Interdisciplinary Treatment and Diagnostic Plan Update  06/30/2022 Time of Session: 09:44 Autumn [name] MRN: [id number]  Principal Diagnosis: Bipolar affective disorder [place])  Secondary Diagnoses: Principal Problem:   Bipolar affective disorder ([place])   Current Medications:  Current Facility-Administered Medications  Medication Dose Route Frequency Provider Last Rate Last Admin   acetaminophen (TYLENOL) tablet 650 mg  650 mg Oral Q6H PRN Sherlon Handing, NP       alum & mag hydroxide-simeth (MAALOX/MYLANTA) 200-200-20 MG/5ML suspension 30 mL  30 mL Oral Q4H PRN Sherlon Handing, NP       hydrOXYzine (ATARAX) tablet 25 mg  25 mg Oral TID PRN Sherlon Handing, NP       magnesium hydroxide (MILK OF MAGNESIA) suspension 30 mL  30 mL Oral Daily PRN Waldon Merl F, NP       nicotine (NICODERM CQ - dosed in mg/24 hours) patch 14 mg  14 mg Transdermal Daily Clapacs, [name], MD       PTA Medications: Medications Prior to Admission  Medication Sig Dispense Refill Last Dose   ARIPiprazole (ABILIFY) 15 MG tablet Take 1 tablet (15 mg total) by mouth daily. (Patient not taking: Reported on 05/29/2022) 30 tablet 0    divalproex (DEPAKOTE) 250 MG DR tablet Take 2 tablets (500 mg total) by mouth every 12 (twelve) hours. (Patient not taking: Reported on 05/29/2022) 120 tablet 0    lithium carbonate (LITHOBID) 300 MG CR tablet Take 2 tablets (600 mg total) by mouth every 12 (twelve) hours. (Patient not taking: Reported on 05/29/2022) 120 tablet 0    norethindrone (MICRONOR) 0.35 MG tablet Take 1 tablet (0.35 mg total) by mouth daily. (Patient not taking: Reported on 05/20/2022) 28 tablet 13    QUEtiapine (SEROQUEL) 200 MG tablet Take 1 tablet (200 mg total) by mouth at bedtime. (Patient not taking: Reported on 05/29/2022) 30 tablet 0     Patient Stressors:    Patient Strengths:    Treatment Modalities: Medication Management, Group therapy, Case management,  1 to 1 session with clinician,  Psychoeducation, Recreational therapy.   Physician Treatment Plan for Primary Diagnosis: Bipolar affective disorder ([place]) Long Term Goal(s):     Short Term Goals:    Medication Management: Evaluate patient's response, side effects, and tolerance of medication regimen.  Therapeutic Interventions: 1 to 1 sessions, Unit Group sessions and Medication administration.  Evaluation of Outcomes: Not Met  Physician Treatment Plan for Secondary Diagnosis: Principal Problem:   Bipolar affective disorder ([place])  Long Term Goal(s):     Short Term Goals:       Medication Management: Evaluate patient's response, side effects, and tolerance of medication regimen.  Therapeutic Interventions: 1 to 1 sessions, Unit Group sessions and Medication administration.  Evaluation of Outcomes: Not Met   RN Treatment Plan for Primary Diagnosis: Bipolar affective disorder ([place]) Long Term Goal(s): Knowledge of disease and therapeutic regimen to maintain health will improve  Short Term Goals: Ability to remain free from injury will improve, Ability to verbalize frustration and anger appropriately will improve, Ability to demonstrate self-control, Ability to participate in decision making will improve, Ability to verbalize feelings will improve, Ability to disclose and discuss suicidal ideas, Ability to identify and develop effective coping behaviors will improve, and Compliance with prescribed medications will improve  Medication Management: RN will administer medications as ordered by provider, will assess and evaluate patient's response and provide education to patient for prescribed medication. RN will report any adverse and/or side effects to prescribing  provider.  Therapeutic Interventions: 1 on 1 counseling sessions, Psychoeducation, Medication administration, Evaluate responses to treatment, Monitor vital signs and CBGs as ordered, Perform/monitor CIWA, COWS, AIMS and Fall Risk screenings as ordered, Perform  wound care treatments as ordered.  Evaluation of Outcomes: Not Met   LCSW Treatment Plan for Primary Diagnosis: Bipolar affective disorder ([place]) Long Term Goal(s): Safe transition to appropriate next level of care at discharge, Engage patient in therapeutic group addressing interpersonal concerns.  Short Term Goals: Engage patient in aftercare planning with referrals and resources, Increase social support, Increase ability to appropriately verbalize feelings, Increase emotional regulation, Facilitate acceptance of mental health diagnosis and concerns, and Increase skills for wellness and recovery  Therapeutic Interventions: Assess for all discharge needs, 1 to 1 time with Social worker, Explore available resources and support systems, Assess for adequacy in community support network, Educate family and significant other(s) on suicide prevention, Complete Psychosocial Assessment, Interpersonal group therapy.  Evaluation of Outcomes: Not Met   Progress in Treatment: Attending groups: No. Participating in groups: No. Taking medication as prescribed: No. Toleration medication: No. Family/Significant other contact made: No, will contact:  if given permission. Patient understands diagnosis: No. Discussing patient identified problems/goals with staff: No. Medical problems stabilized or resolved: Yes. Denies suicidal/homicidal ideation: No. Issues/concerns per patient self-inventory: No. Other: none.  New problem(s) identified: No, Describe:  none identified.   New Short Term/Long Term Goal(s): detox, elimination of symptoms of psychosis, medication management for mood stabilization; elimination of SI thoughts; development of comprehensive mental wellness/sobriety plan.  Patient Goals:  Pt declined to attend treatment team meeting.   Discharge Plan or Barriers: CSW will assist pt with development of an appropriate aftercare/discharge plan.    Reason for Continuation of Hospitalization:  Medication stabilization Other; describe psychosis  Estimated Length of Stay: 1-7 days  Last 3 [place] Suicide Severity Risk Score: Flowsheet Row Admission (Current) from 06/29/2022 in [place] Most recent reading at 06/29/2022  9:15 PM ED from 06/29/2022 in [place] Most recent reading at 06/29/2022  8:08 AM ED from 06/08/2022 in [place] Most recent reading at 06/08/2022  8:57 PM  C-SSRS RISK CATEGORY No Risk No Risk No Risk       Last PHQ 2/9 Scores:    04/01/2022    1:26 PM  Depression screen PHQ 2/9  Decreased Interest 0  Down, Depressed, Hopeless 2  PHQ - 2 Score 2  Altered sleeping 3  Tired, decreased energy 3  Change in appetite 3  Feeling bad or failure about yourself  3  Trouble concentrating 3  Moving slowly or fidgety/restless 3  Suicidal thoughts 0  PHQ-9 Score 20  Difficult doing work/chores Somewhat difficult    Scribe for Treatment Team: Shirl Harris, Marlinda Mike 06/30/2022 2:55 PM

## 2022-06-30 NOTE — Progress Notes (Signed)
Patient arrived from the emergency room.  As soon as the patient arrived, she undressed her pants and urinated on the floor in the hallway while being escorted to the search room.  Patient refuses to sign any forms and refuses to answer questions.  Patient repeatedly refers to this RN as Dr. Bernita Buffy.  She also keeps repeating "I'm 46 years old."  Due to her mental status, this RN is unable to perform a full assessment.  The patient is escorted to her room where she proceeds to undress completely and come out onto the hallway.  Patient redirected back to her room by this RN, the 2nd RN on the unit and security.

## 2022-06-30 NOTE — Progress Notes (Signed)
Patient came out of her fully dressed in scrubs, and asked for food.  Patient was escorted to the snack room and provided with snacks to eat and Gatorade to drink, which she asked for.  Patient then proceeded to take the scrub top off and walk to the day room.  Patient was redirected to put her top back on by the tech on the unit with assistance of security guard.  Patient was then redirected to her room and handed a specimen cup for urine.  Patient provided urine sample stating "you should know I can't get pregnant Dr. Bernita Buffy" as she handed the specimen cup to this RN.  The urine appears cloudy.  Specimen timed and dated and sent to lab with patient label on for UTI testing.

## 2022-06-30 NOTE — Plan of Care (Signed)
  Problem: Education: Goal: Knowledge of [facility] Education information/materials will improve Outcome: Not Progressing Goal: Emotional status will improve Outcome: Not Progressing Goal: Mental status will improve Outcome: Not Progressing Goal: Verbalization of understanding the information provided will improve Outcome: Not Progressing   Problem: Activity: Goal: Interest or engagement in activities will improve Outcome: Not Progressing Goal: Sleeping patterns will improve Outcome: Progressing   Problem: Coping: Goal: Ability to verbalize frustrations and anger appropriately will improve Outcome: Not Progressing Goal: Ability to demonstrate self-control will improve Outcome: Not Progressing   Problem: Health Behavior/Discharge Planning: Goal: Identification of resources available to assist in meeting health care needs will improve Outcome: Not Progressing Goal: Compliance with treatment plan for underlying cause of condition will improve Outcome: Not Progressing   Problem: Physical Regulation: Goal: Ability to maintain clinical measurements within normal limits will improve Outcome: Not Progressing

## 2022-07-01 DIAGNOSIS — F3164 Bipolar disorder, current episode mixed, severe, with psychotic features: Secondary | ICD-10-CM | POA: Diagnosis not present

## 2022-07-01 LAB — URINE CULTURE: Culture: >=100000 — AB

## 2022-07-01 MED ORDER — QUETIAPINE FUMARATE 200 MG PO TABS
300.0000 mg | ORAL_TABLET | Freq: Every day | ORAL | Status: DC
  Administered 2022-07-01: 300 mg via ORAL
  Filled 2022-07-01: qty 1

## 2022-07-01 MED ORDER — ENSURE ENLIVE PO LIQD
237.0000 mL | Freq: Two times a day (BID) | ORAL | Status: DC
Start: 2022-07-02 — End: 2022-07-30
  Administered 2022-07-02 – 2022-07-30 (×54): 237 mL via ORAL

## 2022-07-01 NOTE — BHH Counselor (Signed)
CSW unable to meet with pt to complete PSA due to pt psychosis and agitation. Throughout the day, pt has been floridly psychotic, stripping off her clothes and having to be reminded that she needs to keep her clothes on. Pt actively responding to internal stimuli.   Chalmers Guest. Guerry Bruin, MSW, [place], [place] 07/01/2022 3:44 PM

## 2022-07-01 NOTE — Group Note (Signed)
[facility] LCSW Group Therapy Note   Group Date: 07/01/2022 Start Time: 1300 End Time: 1400   Type of Therapy/Topic:  Group Therapy:  Balance in Life  Participation Level:  Did Not Attend   Description of Group:    This group will address the concept of balance and how it feels and looks when one is unbalanced. Patients will be encouraged to process areas in their lives that are out of balance, and identify reasons for remaining unbalanced. Facilitators will guide patients utilizing problem- solving interventions to address and correct the stressor making their life unbalanced. Understanding and applying boundaries will be explored and addressed for obtaining  and maintaining a balanced life. Patients will be encouraged to explore ways to assertively make their unbalanced needs known to significant others in their lives, using other group members and facilitator for support and feedback.  Therapeutic Goals: Patient will identify two or more emotions or situations they have that consume much of in their lives. Patient will identify signs/triggers that life has become out of balance:  Patient will identify two ways to set boundaries in order to achieve balance in their lives:  Patient will demonstrate ability to communicate their needs through discussion and/or role plays  Summary of Patient Progress: X   Therapeutic Modalities:   Cognitive Behavioral Therapy Solution-Focused Therapy Assertiveness Training   Shirl Harris, LCSW

## 2022-07-01 NOTE — Plan of Care (Signed)
D: Pt alert and oriented. Pt rates depression 10/10, hopelessness 10/10, and anxiety 10/10.  Pt reports energy level as low and concentration as being good. Pt reports sleep last night as being good. Pt did not receive medications for sleep. Pt denies experiencing any pain at this time. Pt denies experiencing any HI, or AVH at this time however, endorses SI without a plan while here. Pt contract for safety while here.   Pt has disrobed multiple times in the milieu. Pt redirected to room and to get dressed each time. Pt educated on the the unit policies, procedures, and expectations.  A: Scheduled medications administered to pt, per MD orders. Support and encouragement provided. Frequent verbal contact made. Routine safety checks conducted q15 minutes.   R: No adverse drug reactions noted. Pt verbally contracts for safety at this time. Pt compliant with medications. Pt interacts poorly with others on the unit. Pt remains safe at this time. Plan of care ongoing.   Problem: Nutrition: Goal: Adequate nutrition will be maintained Outcome: Progressing   Problem: Coping: Goal: Level of anxiety will decrease Outcome: Not Progressing

## 2022-07-01 NOTE — Progress Notes (Signed)
[facility] MD Progress Note  07/01/2022 2:26 PM Autumn [name]  MRN:  [id number] Subjective: Follow-up for this 46 year old woman with a history of bipolar disorder or schizoaffective disorder.  Staff observed the patient walking out in the public areas of the unit completely naked twice this morning.  Spent the rest of the day mostly in bed.  I came to see her this afternoon and once again she told me she did not want to talk yet.  When I tried to further the conversation patient became more bizarre.  She was making strange grimaces with her mouth, she addressed me by a couple of other strange names, ask me if I was going to shoot her.  Then went back to bed covering herself up with a blanket refusing to talk. Principal Problem: Bipolar affective disorder ([place]) Diagnosis: Principal Problem:   Bipolar affective disorder ([place])  Total Time spent with patient: 30 minutes  Past Psychiatric History: Past history of recurrent psychotic disorder  Past Medical History:  Past Medical History:  Diagnosis Date   Allergy    Seasonal, Ultram (seizures)   Anemia 2005   Bipolar 1 disorder ([place])    Glaucoma    Seizures ([place]) 2008    Past Surgical History:  Procedure Laterality Date   BREAST EXCISIONAL BIOPSY Right    x 2   CESAREAN SECTION     x 2   LEEP  2000   Family History:  Family History  Problem Relation Age of Onset   Hypertension Mother    Hyperlipidemia Mother    Asthma Mother    Parkinson's disease Father    Lung cancer Maternal Grandmother    Other Paternal Grandfather 4       Cardiac arrest   Heart disease Paternal Grandfather    Tourette syndrome Daughter    Family Psychiatric  History: See previous Social History:  Social History   Substance and Sexual Activity  Alcohol Use Not Currently   Alcohol/week: 8.0 standard drinks of alcohol   Types: 8 Shots of liquor per week   Comment: last use 03/30/22 4x/yr     Social History   Substance and Sexual Activity  Drug Use Yes    Frequency: 1.0 times per week   Types: Marijuana, Cocaine, Heroin, Methamphetamines, MDMA (Ecstacy)   Comment: last use yesterday    Social History   Socioeconomic History   Marital status: Legally Separated    Spouse name: Not on file   Number of children: Not on file   Years of education: Not on file   Highest education level: Not on file  Occupational History   Not on file  Tobacco Use   Smoking status: Every Day    Packs/day: 0.50    Years: 9.00    Total pack years: 4.50    Types: E-cigarettes, Cigarettes, Cigars   Smokeless tobacco: Former    Types: Nurse, children's Use: Some days   Substances: Nicotine  Substance and Sexual Activity   Alcohol use: Not Currently    Alcohol/week: 8.0 standard drinks of alcohol    Types: 8 Shots of liquor per week    Comment: last use 03/30/22 4x/yr   Drug use: Yes    Frequency: 1.0 times per week    Types: Marijuana, Cocaine, Heroin, Methamphetamines, MDMA (Ecstacy)    Comment: last use yesterday   Sexual activity: Yes    Partners: Male    Birth control/protection: None  Other Topics Concern   Not  on file  Social History Narrative   Not on file   Social Determinants of Health   Financial Resource Strain: Not on file  Food Insecurity: Not on file  Transportation Needs: Not on file  Physical Activity: Not on file  Stress: Not on file  Social Connections: Not on file   Additional Social History:                         Sleep: Fair  Appetite:  Fair  Current Medications: Current Facility-Administered Medications  Medication Dose Route Frequency Provider Last Rate Last Admin   acetaminophen (TYLENOL) tablet 650 mg  650 mg Oral Q6H PRN Sherlon Handing, NP       alum & mag hydroxide-simeth (MAALOX/MYLANTA) 200-200-20 MG/5ML suspension 30 mL  30 mL Oral Q4H PRN Waldon Merl F, NP       ARIPiprazole (ABILIFY) tablet 15 mg  15 mg Oral Daily Varick Keys T, MD   15 mg at 07/01/22 0854   divalproex  (DEPAKOTE) DR tablet 500 mg  500 mg Oral Q12H Brysan Mcevoy T, MD   500 mg at 07/01/22 0854   hydrOXYzine (ATARAX) tablet 25 mg  25 mg Oral TID PRN Sherlon Handing, NP   25 mg at 07/01/22 0902   lithium carbonate (LITHOBID) CR tablet 600 mg  600 mg Oral Q12H Li Fragoso T, MD   600 mg at 07/01/22 0854   magnesium hydroxide (MILK OF MAGNESIA) suspension 30 mL  30 mL Oral Daily PRN Waldon Merl F, NP       nicotine (NICODERM CQ - dosed in mg/24 hours) patch 14 mg  14 mg Transdermal Daily Moataz Tavis T, MD       QUEtiapine (SEROQUEL) tablet 300 mg  300 mg Oral QHS Emalene Welte, [name], MD        Lab Results:  Results for orders placed or performed during the hospital encounter of 06/29/22 (from the past 48 hour(s))  Urine Culture     Status: Abnormal   Collection Time: 06/30/22  3:00 AM   Specimen: Urine, Random  Result Value Ref Range   Specimen Description      URINE, RANDOM Performed at [facility], [address] [place], [place] [address]    Special Requests      NONE Performed at [facility], [address] [place], [place] [address]    Culture (A)     >=100,000 COLONIES/mL GROUP B STREP(S.AGALACTIAE)ISOLATED TESTING AGAINST S. AGALACTIAE NOT ROUTINELY PERFORMED DUE TO PREDICTABILITY OF AMP/PEN/VAN SUSCEPTIBILITY. Performed at [facility], [place] [address], [place],  [address]    Report Status 07/01/2022 FINAL     Blood Alcohol level:  Lab Results  Component Value Date   ETH <10 06/29/2022   ETH <10 29/51/8841    Metabolic Disorder Labs: Lab Results  Component Value Date   HGBA1C 4.9 12/25/2021   MPG 93.93 12/25/2021   MPG 114 06/02/2021   No results found for: "PROLACTIN" Lab Results  Component Value Date   CHOL 160 12/25/2021   TRIG 95 12/25/2021   HDL 58 12/25/2021   CHOLHDL 2.8 12/25/2021   VLDL 19 12/25/2021   LDLCALC 83 12/25/2021   LDLCALC 63 06/01/2021    Physical Findings: AIMS:  , ,  ,  ,    CIWA:     COWS:     Musculoskeletal: Strength & Muscle Tone: within normal limits Gait & Station: normal Patient  leans: N/A  Psychiatric Specialty Exam:  Presentation  General Appearance: Bizarre  Eye Contact:Fair  Speech:-- (non-linear)  Speech Volume:Normal  Handedness:Right   Mood and Affect  Mood:Dysphoric  Affect:Blunt   Thought Process  Thought Processes:Disorganized  Descriptions of Associations:Loose  Orientation:Partial  Thought Content:Illogical  History of Schizophrenia/Schizoaffective disorder:Yes  Duration of Psychotic Symptoms:Greater than six months  Hallucinations:No data recorded Ideas of Reference:-- Pincus Badder)  Suicidal Thoughts:No data recorded Homicidal Thoughts:No data recorded  Sensorium  Memory:Immediate Poor  Judgment:Impaired  Insight:Lacking   Executive Functions  Concentration:Poor  Attention Span:Poor  Recall:Poor  Fund of Knowledge:Poor  Language:Poor   Psychomotor Activity  Psychomotor Activity:No data recorded  Assets  Assets:Resilience   Sleep  Sleep:No data recorded   Physical Exam: Physical Exam Vitals and nursing note reviewed.  Constitutional:      Appearance: Normal appearance.  HENT:     Head: Normocephalic and atraumatic.     Mouth/Throat:     Pharynx: Oropharynx is clear.  Eyes:     Pupils: Pupils are equal, round, and reactive to light.  Cardiovascular:     Rate and Rhythm: Normal rate and regular rhythm.  Pulmonary:     Effort: Pulmonary effort is normal.     Breath sounds: Normal breath sounds.  Abdominal:     General: Abdomen is flat.     Palpations: Abdomen is soft.  Musculoskeletal:        General: Normal range of motion.  Skin:    General: Skin is warm and dry.  Neurological:     General: No focal deficit present.     Mental Status: She is alert. Mental status is at baseline.  Psychiatric:        Attention and Perception: She is inattentive.        Mood and Affect: Affect is  inappropriate.        Speech: She is noncommunicative. Speech is tangential.        Behavior: Behavior is agitated. Behavior is not aggressive.    Review of Systems  Unable to perform ROS: Psychiatric disorder   Blood pressure 95/63, pulse 60, temperature 97.8 F (36.6 C), temperature source Oral, resp. rate 18, height '5\' 1"'$  (1.549 m), weight 45 kg, last menstrual period 05/17/2022, SpO2 100 %. Body mass index is 18.74 kg/m.   Treatment Plan Summary: Plan patient continues to be psychotic disorganized bizarre behavior.  Meds reviewed.  I had started some of her medicine at slightly lower dosages since she had presumably been off of them.  I will go ahead and increase the Seroquel to 300 mg at night.  Continue daily observation and monitoring with full treatment team hoping she will soon get into a state of mind where she can have a conversation  Alethia Berthold, MD 07/01/2022, 2:26 PM

## 2022-07-01 NOTE — Plan of Care (Signed)
  Problem: Education: Goal: Knowledge of General Education information will improve Description: Including pain rating scale, medication(s)/side effects and non-pharmacologic comfort measures Outcome: Not Progressing   Problem: Health Behavior/Discharge Planning: Goal: Ability to manage health-related needs will improve Outcome: Not Progressing   Problem: Clinical Measurements: Goal: Ability to maintain clinical measurements within normal limits will improve Outcome: Not Progressing Goal: Will remain free from infection Outcome: Not Progressing Goal: Diagnostic test results will improve Outcome: Not Progressing Goal: Respiratory complications will improve Outcome: Not Progressing Goal: Cardiovascular complication will be avoided Outcome: Not Progressing   Problem: Safety: Goal: Periods of time without injury will increase Outcome: Not Progressing   Problem: Physical Regulation: Goal: Ability to maintain clinical measurements within normal limits will improve Outcome: Not Progressing   Problem: Health Behavior/Discharge Planning: Goal: Identification of resources available to assist in meeting health care needs will improve Outcome: Not Progressing Goal: Compliance with treatment plan for underlying cause of condition will improve Outcome: Not Progressing   Problem: Coping: Goal: Ability to verbalize frustrations and anger appropriately will improve Outcome: Not Progressing Goal: Ability to demonstrate self-control will improve Outcome: Not Progressing   Problem: Activity: Goal: Interest or engagement in activities will improve Outcome: Not Progressing Goal: Sleeping patterns will improve Outcome: Not Progressing   Problem: Education: Goal: Knowledge of [place] [facility] Education information/materials will improve Outcome: Not Progressing Goal: Emotional status will improve Outcome: Not Progressing Goal: Mental status will improve Outcome: Not Progressing Goal:  Verbalization of understanding the information provided will improve Outcome: Not Progressing   Problem: Skin Integrity: Goal: Risk for impaired skin integrity will decrease Outcome: Not Progressing   Problem: Safety: Goal: Ability to remain free from injury will improve Outcome: Not Progressing

## 2022-07-01 NOTE — B[facility] Group Notes (Signed)
[place] Group Notes:  (Nursing/MHT/Case Management/Adjunct)  Date:  07/01/2022  Time:  10:33 AM  Type of Therapy:   community meeting  Participation Level:  Did Not Attend   Autumn [name] 07/01/2022, 10:33 AM

## 2022-07-01 NOTE — Progress Notes (Addendum)
Recreation Therapy Notes  Date: 07/01/2022  Time: 10:35 am    Location: Craft room    Behavioral response: N/A   Intervention Topic: Stress Management   Discussion/Intervention: Patient refused to attend group.   Clinical Observations/Feedback:  Patient refused to attend group.    Samarra Ridgely LRT/CTRS        Abdallah Hern 07/01/2022 11:45 AM

## 2022-07-02 DIAGNOSIS — F3164 Bipolar disorder, current episode mixed, severe, with psychotic features: Secondary | ICD-10-CM | POA: Diagnosis not present

## 2022-07-02 MED ORDER — QUETIAPINE FUMARATE 200 MG PO TABS
400.0000 mg | ORAL_TABLET | Freq: Every day | ORAL | Status: DC
  Administered 2022-07-03 – 2022-07-09 (×7): 400 mg via ORAL
  Filled 2022-07-02 (×8): qty 2

## 2022-07-02 NOTE — Progress Notes (Signed)
[facility] MD Progress Note  07/02/2022 2:36 PM Autumn [name]  MRN:  [id number] Subjective: Follow-up patient with bipolar disorder.  Patient still mostly uncooperative today but a little better than yesterday.  She did get up out of her bed and came out to eat.  She was able to have a couple of back-and-forth parts of a conversation with me instead of just dismissing me completely.  Still negativistic and irritable no evidence of self-harm.  Took her medicine today. Principal Problem: Bipolar affective disorder ([place]) Diagnosis: Principal Problem:   Bipolar affective disorder ([place])  Total Time spent with patient: 30 minutes  Past Psychiatric History: Past history of bipolar disorder and substance abuse  Past Medical History:  Past Medical History:  Diagnosis Date   Allergy    Seasonal, Ultram (seizures)   Anemia 2005   Bipolar 1 disorder ([place])    Glaucoma    Seizures ([place]) 2008    Past Surgical History:  Procedure Laterality Date   BREAST EXCISIONAL BIOPSY Right    x 2   CESAREAN SECTION     x 2   LEEP  2000   Family History:  Family History  Problem Relation Age of Onset   Hypertension Mother    Hyperlipidemia Mother    Asthma Mother    Parkinson's disease Father    Lung cancer Maternal Grandmother    Other Paternal Grandfather 33       Cardiac arrest   Heart disease Paternal Grandfather    Tourette syndrome Daughter    Family Psychiatric  History: See previous Social History:  Social History   Substance and Sexual Activity  Alcohol Use Not Currently   Alcohol/week: 8.0 standard drinks of alcohol   Types: 8 Shots of liquor per week   Comment: last use 03/30/22 4x/yr     Social History   Substance and Sexual Activity  Drug Use Yes   Frequency: 1.0 times per week   Types: Marijuana, Cocaine, Heroin, Methamphetamines, MDMA (Ecstacy)   Comment: last use yesterday    Social History   Socioeconomic History   Marital status: Legally Separated    Spouse name: Not on  file   Number of children: Not on file   Years of education: Not on file   Highest education level: Not on file  Occupational History   Not on file  Tobacco Use   Smoking status: Every Day    Packs/day: 0.50    Years: 9.00    Total pack years: 4.50    Types: E-cigarettes, Cigarettes, Cigars   Smokeless tobacco: Former    Types: Nurse, children's Use: Some days   Substances: Nicotine  Substance and Sexual Activity   Alcohol use: Not Currently    Alcohol/week: 8.0 standard drinks of alcohol    Types: 8 Shots of liquor per week    Comment: last use 03/30/22 4x/yr   Drug use: Yes    Frequency: 1.0 times per week    Types: Marijuana, Cocaine, Heroin, Methamphetamines, MDMA (Ecstacy)    Comment: last use yesterday   Sexual activity: Yes    Partners: Male    Birth control/protection: None  Other Topics Concern   Not on file  Social History Narrative   Not on file   Social Determinants of Health   Financial Resource Strain: Not on file  Food Insecurity: Not on file  Transportation Needs: Not on file  Physical Activity: Not on file  Stress: Not on file  Social  Connections: Not on file   Additional Social History:                         Sleep: Fair  Appetite:  Fair  Current Medications: Current Facility-Administered Medications  Medication Dose Route Frequency Provider Last Rate Last Admin   acetaminophen (TYLENOL) tablet 650 mg  650 mg Oral Q6H PRN Sherlon Handing, NP       alum & mag hydroxide-simeth (MAALOX/MYLANTA) 200-200-20 MG/5ML suspension 30 mL  30 mL Oral Q4H PRN Sherlon Handing, NP       ARIPiprazole (ABILIFY) tablet 15 mg  15 mg Oral Daily Meryn Sarracino, [name], MD   15 mg at 07/02/22 0852   divalproex (DEPAKOTE) DR tablet 500 mg  500 mg Oral Q12H Skyler Dusing, [name], MD   500 mg at 07/02/22 7673   feeding supplement (ENSURE ENLIVE / ENSURE PLUS) liquid 237 mL  237 mL Oral BID BM Zacheriah Stumpe T, MD   237 mL at 07/02/22 1407   hydrOXYzine  (ATARAX) tablet 25 mg  25 mg Oral TID PRN Sherlon Handing, NP   25 mg at 07/01/22 0902   lithium carbonate (LITHOBID) CR tablet 600 mg  600 mg Oral Q12H Dalynn Jhaveri T, MD   600 mg at 07/02/22 4193   magnesium hydroxide (MILK OF MAGNESIA) suspension 30 mL  30 mL Oral Daily PRN Waldon Merl F, NP       nicotine (NICODERM CQ - dosed in mg/24 hours) patch 14 mg  14 mg Transdermal Daily Girard Koontz T, MD       QUEtiapine (SEROQUEL) tablet 400 mg  400 mg Oral QHS Tyauna Lacaze T, MD        Lab Results: No results found for this or any previous visit (from the past 48 hour(s)).  Blood Alcohol level:  Lab Results  Component Value Date   ETH <10 06/29/2022   ETH <10 79/12/4095    Metabolic Disorder Labs: Lab Results  Component Value Date   HGBA1C 4.9 12/25/2021   MPG 93.93 12/25/2021   MPG 114 06/02/2021   No results found for: "PROLACTIN" Lab Results  Component Value Date   CHOL 160 12/25/2021   TRIG 95 12/25/2021   HDL 58 12/25/2021   CHOLHDL 2.8 12/25/2021   VLDL 19 12/25/2021   LDLCALC 83 12/25/2021   LDLCALC 63 06/01/2021    Physical Findings: AIMS:  , ,  ,  ,    CIWA:    COWS:     Musculoskeletal: Strength & Muscle Tone: within normal limits Gait & Station: normal Patient leans: N/A  Psychiatric Specialty Exam:  Presentation  General Appearance: Bizarre  Eye Contact:Fair  Speech:-- (non-linear)  Speech Volume:Normal  Handedness:Right   Mood and Affect  Mood:Dysphoric  Affect:Blunt   Thought Process  Thought Processes:Disorganized  Descriptions of Associations:Loose  Orientation:Partial  Thought Content:Illogical  History of Schizophrenia/Schizoaffective disorder:Yes  Duration of Psychotic Symptoms:Greater than six months  Hallucinations:No data recorded Ideas of Reference:-- (uta)  Suicidal Thoughts:No data recorded Homicidal Thoughts:No data recorded  Sensorium  Memory:Immediate  Poor  Judgment:Impaired  Insight:Lacking   Executive Functions  Concentration:Poor  Attention Span:Poor  Recall:Poor  Fund of Knowledge:Poor  Language:Poor   Psychomotor Activity  Psychomotor Activity:No data recorded  Assets  Assets:Resilience   Sleep  Sleep:No data recorded   Physical Exam: Physical Exam Vitals and nursing note reviewed.  Constitutional:      Appearance: Normal appearance.  HENT:  Head: Normocephalic and atraumatic.     Mouth/Throat:     Pharynx: Oropharynx is clear.  Eyes:     Pupils: Pupils are equal, round, and reactive to light.  Cardiovascular:     Rate and Rhythm: Normal rate and regular rhythm.  Pulmonary:     Effort: Pulmonary effort is normal.     Breath sounds: Normal breath sounds.  Abdominal:     General: Abdomen is flat.     Palpations: Abdomen is soft.  Musculoskeletal:        General: Normal range of motion.  Skin:    General: Skin is warm and dry.  Neurological:     General: No focal deficit present.     Mental Status: She is alert. Mental status is at baseline.  Psychiatric:        Attention and Perception: She is inattentive.        Mood and Affect: Affect is inappropriate.        Speech: Speech is tangential.        Behavior: Behavior is withdrawn.        Thought Content: Thought content is paranoid.        Cognition and Memory: Cognition is impaired. Memory is impaired.    Review of Systems  Constitutional: Negative.   HENT: Negative.    Eyes: Negative.   Respiratory: Negative.    Cardiovascular: Negative.   Gastrointestinal: Negative.   Musculoskeletal: Negative.   Skin: Negative.   Neurological: Negative.   Psychiatric/Behavioral:  Positive for depression.    Blood pressure 106/72, pulse 80, temperature 97.8 F (36.6 C), temperature source Oral, resp. rate 17, height '5\' 1"'$  (1.549 m), weight 45 kg, last menstrual period 05/17/2022, SpO2 92 %. Body mass index is 18.74 kg/m.   Treatment Plan  Summary: Plan increase Seroquel to 400 mg at night.  Encourage patient in continuing to take her medicine.  Expect continued improvement over the next several days.  Alethia Berthold, MD 07/02/2022, 2:36 PM

## 2022-07-02 NOTE — Progress Notes (Signed)
Recreation Therapy Notes  INPATIENT RECREATION TR PLAN  Patient Details Name: Autumn [name] MRN: [id number] DOB: [birth year] Today's Date: 07/02/2022  Rec Therapy Plan Is patient appropriate for Therapeutic Recreation?: Yes Treatment times per week: at least 3 Estimated Length of Stay: 5-7 days TR Treatment/Interventions: Group participation (Comment)  Discharge Criteria Pt will be discharged from therapy if:: Discharged Treatment plan/goals/alternatives discussed and agreed upon by:: Patient/family  Discharge Summary     Mercia Dowe 07/02/2022, 1:17 PM

## 2022-07-02 NOTE — Plan of Care (Signed)
D: Pt alert and oriented. Pt rates depression 5/10, hopelessness 5/10, and anxiety 5/10. Pt goal: "Go to Group." Pt reports energy level as normal and concentration as being good. Pt reports sleep last night as being good. Pt did not receive medications for sleep. Pt denies experiencing any pain at this time. Pt denies experiencing any SI/HI, or AVH at this time.   A: Scheduled medications administered to pt, per MD orders. Support and encouragement provided. Frequent verbal contact made. Routine safety checks conducted q15 minutes.   R: No adverse drug reactions noted. Pt verbally contracts for safety at this time. Pt compliant with medications and treatment plan. Pt interacts poorly with others on the unit. Pt remains safe at this time. Plan of care ongoing.   Problem: Coping: Goal: Level of anxiety will decrease Outcome: Not Progressing   Problem: Nutrition: Goal: Adequate nutrition will be maintained Outcome: Progressing

## 2022-07-02 NOTE — Group Note (Signed)
[facility] LCSW Group Therapy Note   Group Date: 07/02/2022 Start Time: 1300 End Time: 1400  Type of Therapy and Topic:  Group Therapy:  Feelings around Relapse and Recovery  Participation Level:  Did Not Attend   Description of Group:    Patients in this group will discuss emotions they experience before and after a relapse. They will process how experiencing these feelings, or avoidance of experiencing them, relates to having a relapse. Facilitator will guide patients to explore emotions they have related to recovery. Patients will be encouraged to process which emotions are more powerful. They will be guided to discuss the emotional reaction significant others in their lives may have to patients' relapse or recovery. Patients will be assisted in exploring ways to respond to the emotions of others without this contributing to a relapse.  Therapeutic Goals: Patient will identify two or more emotions that lead to relapse for them:  Patient will identify two emotions that result when they relapse:  Patient will identify two emotions related to recovery:  Patient will demonstrate ability to communicate their needs through discussion and/or role plays.   Summary of Patient Progress: X   Therapeutic Modalities:   Cognitive Behavioral Therapy Solution-Focused Therapy Assertiveness Training Relapse Prevention Therapy   Shirl Harris, LCSW
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
## 2022-07-02 NOTE — Progress Notes (Signed)
Recreation Therapy Notes  INPATIENT RECREATION THERAPY ASSESSMENT  Patient Details Name: Autumn [name] MRN: [id number] DOB: 1976-09-21 Today's Date: 07/02/2022       Information Obtained From: Patient (Patient refused stating she does not answer questions.)  Able to Participate in Assessment/Interview:    Patient Presentation:    Reason for Admission (Per Patient):    Patient Stressors:    Coping Skills:      Leisure Interests (2+):     Frequency of Recreation/Participation:    Awareness of Community Resources:     [employer]:     Current Use:    If no, Barriers?:    Expressed Interest in [place] of Residence:     Patient Main Form of Transportation:    Patient Strengths:     Patient Identified Areas of Improvement:     Patient Goal for Hospitalization:     Current SI (including self-harm):     Current HI:     Current AVH:    Staff Intervention Plan:    Consent to Intern Participation:    Autumn [name] 07/02/2022, 1:17 PM

## 2022-07-02 NOTE — Progress Notes (Signed)
Recreation Therapy Notes  Date: 07/02/2022  Time: 10:50 am    Location: Courtyard    Behavioral response: N/A   Intervention Topic: Social skills    Discussion/Intervention: Patient refused to attend group.   Clinical Observations/Feedback:  Patient refused to attend group.    Shemeca Lukasik LRT/CTRS        Jerrelle Michelsen 07/02/2022 12:58 PM        Abrar Bilton 07/02/2022 12:58 PM

## 2022-07-02 NOTE — Progress Notes (Signed)
Patient is better. Less irritable and verbally aggressive. She  is med compliant and takes her meds without incident.  She denies si/hi/avh depression. She does endorse anxiety.  She has been mostly isolative to her room except for meals and meds. Support encouragement provided.  Will continue to monitor with q 15 minute safety checks in place.     C Butler-Nicholson, LPN

## 2022-07-03 DIAGNOSIS — F3164 Bipolar disorder, current episode mixed, severe, with psychotic features: Secondary | ICD-10-CM | POA: Diagnosis not present

## 2022-07-03 NOTE — Plan of Care (Signed)
D: Pt alert and oriented. Pt rates depression 0/10, hopelessness 0/10, and anxiety 0/10. Pt goal: "Go to group." Pt reports energy level as normal and concentration as being good. Pt reports sleep last night as being good. Pt did not receive medications for sleep. Pt denies experiencing any pain at this time. Pt reports experiencing any SI/HI, or AVH at this time.   Pt has attended a group session today. Pt's mood/affect observed as improving in comparison to prior days. Pt has   A: Scheduled medications administered to pt, per MD orders. Support and encouragement provided. Frequent verbal contact made. Routine safety checks conducted q15 minutes.   R: No adverse drug reactions noted. Pt verbally contracts for safety at this time. Pt compliant with medications and treatment plan. Pt interacts well with others on the unit. Pt remains safe at this time. Plan of care ongoing.   Problem: Activity: Goal: Risk for activity intolerance will decrease Outcome: Progressing   Problem: Nutrition: Goal: Adequate nutrition will be maintained Outcome: Progressing

## 2022-07-03 NOTE — Progress Notes (Signed)
Patient alert and oriented x 3 with periods of confusion to situation. Patient's affect is blunted thoughts are disorganized and speech is tangential she appears responding to internal  stimuli. Writer was unable to assess for SI/HI, patient was offered emotional support , she refused evening medication regimen,  15 minutes safety checks maintained will continue to monitor closely.

## 2022-07-03 NOTE — BHH Group Notes (Signed)
PsychoEducational Group Note- Patients were read a poem by Cristopher Peru Titled '' There's a hole in my sidewalk ''  Patient's were then asked to reflect and identify ways in which they noticed '' before they fell in the hole '' as it relates to hospitalization. Pt participated and was disorganized in her thinking, sharing things off topic at times.

## 2022-07-03 NOTE — Progress Notes (Signed)
[facility] MD Progress Note  07/03/2022 12:29 PM Autumn [name]  MRN:  [id number] Subjective: Follow-up note for this 46 year old woman with bipolar disorder.  Today she is still in bed in the morning but when she sat up she actually was much more appropriate.  She did not address me by any bizarre names and her conversation was mostly on target.  She told me that she needed Depakote lithium and antipsychotic and I told her that she was already getting all 3.  Slept a little better last night.  Still not able to have much of a lucid conversation but behavior is better and she is eating better Principal Problem: Bipolar affective disorder ([place]) Diagnosis: Principal Problem:   Bipolar affective disorder ([place])  Total Time spent with patient: 30 minutes  Past Psychiatric History: Past history of bipolar disorder with recurrent psychotic episodes complicated by medicine noncompliance and substance abuse  Past Medical History:  Past Medical History:  Diagnosis Date   Allergy    Seasonal, Ultram (seizures)   Anemia 2005   Bipolar 1 disorder ([place])    Glaucoma    Seizures ([place]) 2008    Past Surgical History:  Procedure Laterality Date   BREAST EXCISIONAL BIOPSY Right    x 2   CESAREAN SECTION     x 2   LEEP  2000   Family History:  Family History  Problem Relation Age of Onset   Hypertension Mother    Hyperlipidemia Mother    Asthma Mother    Parkinson's disease Father    Lung cancer Maternal Grandmother    Other Paternal Grandfather 7       Cardiac arrest   Heart disease Paternal Grandfather    Tourette syndrome Daughter    Family Psychiatric  History: See previous Social History:  Social History   Substance and Sexual Activity  Alcohol Use Not Currently   Alcohol/week: 8.0 standard drinks of alcohol   Types: 8 Shots of liquor per week   Comment: last use 03/30/22 4x/yr     Social History   Substance and Sexual Activity  Drug Use Yes   Frequency: 1.0 times per week   Types:  Marijuana, Cocaine, Heroin, Methamphetamines, MDMA (Ecstacy)   Comment: last use yesterday    Social History   Socioeconomic History   Marital status: Legally Separated    Spouse name: Not on file   Number of children: Not on file   Years of education: Not on file   Highest education level: Not on file  Occupational History   Not on file  Tobacco Use   Smoking status: Every Day    Packs/day: 0.50    Years: 9.00    Total pack years: 4.50    Types: E-cigarettes, Cigarettes, Cigars   Smokeless tobacco: Former    Types: Nurse, children's Use: Some days   Substances: Nicotine  Substance and Sexual Activity   Alcohol use: Not Currently    Alcohol/week: 8.0 standard drinks of alcohol    Types: 8 Shots of liquor per week    Comment: last use 03/30/22 4x/yr   Drug use: Yes    Frequency: 1.0 times per week    Types: Marijuana, Cocaine, Heroin, Methamphetamines, MDMA (Ecstacy)    Comment: last use yesterday   Sexual activity: Yes    Partners: Male    Birth control/protection: None  Other Topics Concern   Not on file  Social History Narrative   Not on file  Social Determinants of Health   Financial Resource Strain: Not on file  Food Insecurity: Not on file  Transportation Needs: Not on file  Physical Activity: Not on file  Stress: Not on file  Social Connections: Not on file   Additional Social History:                         Sleep: Fair  Appetite:  Fair  Current Medications: Current Facility-Administered Medications  Medication Dose Route Frequency Provider Last Rate Last Admin   acetaminophen (TYLENOL) tablet 650 mg  650 mg Oral Q6H PRN Sherlon Handing, NP       alum & mag hydroxide-simeth (MAALOX/MYLANTA) 200-200-20 MG/5ML suspension 30 mL  30 mL Oral Q4H PRN Waldon Merl F, NP       ARIPiprazole (ABILIFY) tablet 15 mg  15 mg Oral Daily Haddie Bruhl T, MD   15 mg at 07/03/22 0757   divalproex (DEPAKOTE) DR tablet 500 mg  500 mg Oral  Q12H Britten Seyfried T, MD   500 mg at 07/03/22 0757   feeding supplement (ENSURE ENLIVE / ENSURE PLUS) liquid 237 mL  237 mL Oral BID BM Sanjuan Sawa T, MD   237 mL at 07/03/22 0949   hydrOXYzine (ATARAX) tablet 25 mg  25 mg Oral TID PRN Sherlon Handing, NP   25 mg at 07/01/22 0902   lithium carbonate (LITHOBID) CR tablet 600 mg  600 mg Oral Q12H Lynsey Ange T, MD   600 mg at 07/03/22 0757   magnesium hydroxide (MILK OF MAGNESIA) suspension 30 mL  30 mL Oral Daily PRN Waldon Merl F, NP       nicotine (NICODERM CQ - dosed in mg/24 hours) patch 14 mg  14 mg Transdermal Daily Tajai Ihde T, MD       QUEtiapine (SEROQUEL) tablet 400 mg  400 mg Oral QHS Teagan Ozawa T, MD        Lab Results: No results found for this or any previous visit (from the past 48 hour(s)).  Blood Alcohol level:  Lab Results  Component Value Date   ETH <10 06/29/2022   ETH <10 78/24/2353    Metabolic Disorder Labs: Lab Results  Component Value Date   HGBA1C 4.9 12/25/2021   MPG 93.93 12/25/2021   MPG 114 06/02/2021   No results found for: "PROLACTIN" Lab Results  Component Value Date   CHOL 160 12/25/2021   TRIG 95 12/25/2021   HDL 58 12/25/2021   CHOLHDL 2.8 12/25/2021   VLDL 19 12/25/2021   LDLCALC 83 12/25/2021   LDLCALC 63 06/01/2021    Physical Findings: AIMS:  , ,  ,  ,    CIWA:    COWS:     Musculoskeletal: Strength & Muscle Tone: within normal limits Gait & Station: normal Patient leans: N/A  Psychiatric Specialty Exam:  Presentation  General Appearance: Bizarre  Eye Contact:Fair  Speech:-- (non-linear)  Speech Volume:Normal  Handedness:Right   Mood and Affect  Mood:Dysphoric  Affect:Blunt   Thought Process  Thought Processes:Disorganized  Descriptions of Associations:Loose  Orientation:Partial  Thought Content:Illogical  History of Schizophrenia/Schizoaffective disorder:Yes  Duration of Psychotic Symptoms:Greater than six  months  Hallucinations:No data recorded Ideas of Reference:-- (uta)  Suicidal Thoughts:No data recorded Homicidal Thoughts:No data recorded  Sensorium  Memory:Immediate Poor  Judgment:Impaired  Insight:Lacking   Executive Functions  Concentration:Poor  Attention Span:Poor  Recall:Poor  Fund of Knowledge:Poor  Language:Poor   Psychomotor Activity  Psychomotor Activity:No  data recorded  Assets  Assets:Resilience   Sleep  Sleep:No data recorded   Physical Exam: Physical Exam Vitals and nursing note reviewed.  Constitutional:      Appearance: Normal appearance.  HENT:     Head: Normocephalic and atraumatic.     Mouth/Throat:     Pharynx: Oropharynx is clear.  Eyes:     Pupils: Pupils are equal, round, and reactive to light.  Cardiovascular:     Rate and Rhythm: Normal rate and regular rhythm.  Pulmonary:     Effort: Pulmonary effort is normal.     Breath sounds: Normal breath sounds.  Abdominal:     General: Abdomen is flat.     Palpations: Abdomen is soft.  Musculoskeletal:        General: Normal range of motion.  Skin:    General: Skin is warm and dry.  Neurological:     General: No focal deficit present.     Mental Status: She is alert. Mental status is at baseline.  Psychiatric:        Attention and Perception: Attention normal.        Mood and Affect: Mood normal. Affect is blunt.        Speech: Speech is delayed.        Behavior: Behavior is slowed.        Thought Content: Thought content normal.        Cognition and Memory: Memory is impaired.    Review of Systems  Constitutional: Negative.   HENT: Negative.    Eyes: Negative.   Respiratory: Negative.    Cardiovascular: Negative.   Gastrointestinal: Negative.   Musculoskeletal: Negative.   Skin: Negative.   Neurological: Negative.   Psychiatric/Behavioral:  Positive for depression and memory loss.    Blood pressure 90/68, pulse 72, temperature 97.7 F (36.5 C), temperature  source Oral, resp. rate 18, height '5\' 1"'$  (1.549 m), weight 45 kg, last menstrual period 05/17/2022, SpO2 100 %. Body mass index is 18.74 kg/m.   Treatment Plan Summary: Medication management and Plan continue current doses of medicine including the 400 of Seroquel for now.  Psychoeducation with patient including pointing out she is doing better today.  Encouraged her to try and be up out of bed as much as she can during the daytime and reassure her that things are getting better.  Alethia Berthold, MD 07/03/2022, 12:29 PM

## 2022-07-03 NOTE — BHH Group Notes (Signed)
Pt did not attend group. 

## 2022-07-04 DIAGNOSIS — F3164 Bipolar disorder, current episode mixed, severe, with psychotic features: Secondary | ICD-10-CM | POA: Diagnosis not present

## 2022-07-04 MED ORDER — DIVALPROEX SODIUM 500 MG PO DR TAB
750.0000 mg | DELAYED_RELEASE_TABLET | Freq: Two times a day (BID) | ORAL | Status: DC
  Administered 2022-07-04 – 2022-07-09 (×10): 750 mg via ORAL
  Filled 2022-07-04 (×10): qty 1

## 2022-07-04 NOTE — Plan of Care (Signed)
Patient stays in bed except for meals. Patient states that she is feeling better today and stated that she slept good last night. Denies SI,HI and AVH. Appetite and energy level good. Encouraged for shower and change of cloths provided. No aggressive behaviors noted today. Support and encouragement given.

## 2022-07-04 NOTE — BHH Group Notes (Signed)
LCSW Wellness Group Note   07/04/2022 1:00pm  Type of Group and Topic: Psychoeducational Group:  Wellness  Participation Level:  did not attend  Description of Group  Wellness group introduces the topic and its focus on developing healthy habits across the spectrum and its relationship to a decrease in hospital admissions.  Six areas of wellness are discussed: physical, social spiritual, intellectual, occupational, and emotional.  Patients are asked to consider their current wellness habits and to identify areas of wellness where they are interested and able to focus on improvements.    Therapeutic Goals Patients will understand components of wellness and how they can positively impact overall health.  Patients will identify areas of wellness where they have developed good habits. Patients will identify areas of wellness where they would like to make improvements.    Summary of Patient Progress     Therapeutic Modalities: Cognitive Behavioral Therapy Psychoeducation    Joanne Chars, LCSW

## 2022-07-04 NOTE — BHH Group Notes (Signed)
[facility] Group Notes:  (Nursing/MHT/Case Management/Adjunct)  Date:  07/04/2022  Time:  9:18 PM  Type of Therapy:   Wrap up  Participation Level:  Did Not Attend   Autumn [name] 07/04/2022, 9:18 PM

## 2022-07-04 NOTE — Progress Notes (Signed)
[facility] MD Progress Note  07/04/2022 12:01 PM Autumn [name]  MRN:  [id number] Subjective: Patient seen for follow-up.  [name] again today.  Addressed me by the correct name.  Only request was for more food which she is already getting double portions.  Tolerating medicine well.  Sleeping better.  Affect still blunted but not bizarre or agitated Principal Problem: Bipolar affective disorder ([place]) Diagnosis: Principal Problem:   Bipolar affective disorder ([place])  Total Time spent with patient: 30 minutes  Past Psychiatric History: History of bipolar disorder  Past Medical History:  Past Medical History:  Diagnosis Date   Allergy    Seasonal, Ultram (seizures)   Anemia 2005   Bipolar 1 disorder ([place])    Glaucoma    Seizures ([place]) 2008    Past Surgical History:  Procedure Laterality Date   BREAST EXCISIONAL BIOPSY Right    x 2   CESAREAN SECTION     x 2   LEEP  2000   Family History:  Family History  Problem Relation Age of Onset   Hypertension Mother    Hyperlipidemia Mother    Asthma Mother    Parkinson's disease Father    Lung cancer Maternal Grandmother    Other Paternal Grandfather 76       Cardiac arrest   Heart disease Paternal Grandfather    Tourette syndrome Daughter    Family Psychiatric  History: See previous Social History:  Social History   Substance and Sexual Activity  Alcohol Use Not Currently   Alcohol/week: 8.0 standard drinks of alcohol   Types: 8 Shots of liquor per week   Comment: last use 03/30/22 4x/yr     Social History   Substance and Sexual Activity  Drug Use Yes   Frequency: 1.0 times per week   Types: Marijuana, Cocaine, Heroin, Methamphetamines, MDMA (Ecstacy)   Comment: last use yesterday    Social History   Socioeconomic History   Marital status: Legally Separated    Spouse name: Not on file   Number of children: Not on file   Years of education: Not on file   Highest education level: Not on file  Occupational History   Not  on file  Tobacco Use   Smoking status: Every Day    Packs/day: 0.50    Years: 9.00    Total pack years: 4.50    Types: E-cigarettes, Cigarettes, Cigars   Smokeless tobacco: Former    Types: Nurse, children's Use: Some days   Substances: Nicotine  Substance and Sexual Activity   Alcohol use: Not Currently    Alcohol/week: 8.0 standard drinks of alcohol    Types: 8 Shots of liquor per week    Comment: last use 03/30/22 4x/yr   Drug use: Yes    Frequency: 1.0 times per week    Types: Marijuana, Cocaine, Heroin, Methamphetamines, MDMA (Ecstacy)    Comment: last use yesterday   Sexual activity: Yes    Partners: Male    Birth control/protection: None  Other Topics Concern   Not on file  Social History Narrative   Not on file   Social Determinants of Health   Financial Resource Strain: Not on file  Food Insecurity: Not on file  Transportation Needs: Not on file  Physical Activity: Not on file  Stress: Not on file  Social Connections: Not on file   Additional Social History:  Sleep: Fair  Appetite:  Fair  Current Medications: Current Facility-Administered Medications  Medication Dose Route Frequency Provider Last Rate Last Admin   acetaminophen (TYLENOL) tablet 650 mg  650 mg Oral Q6H PRN Sherlon Handing, NP       alum & mag hydroxide-simeth (MAALOX/MYLANTA) 200-200-20 MG/5ML suspension 30 mL  30 mL Oral Q4H PRN Waldon Merl F, NP       ARIPiprazole (ABILIFY) tablet 15 mg  15 mg Oral Daily Nilo Fallin T, MD   15 mg at 07/04/22 0852   divalproex (DEPAKOTE) DR tablet 500 mg  500 mg Oral Q12H Kadance Mccuistion T, MD   500 mg at 07/04/22 0853   feeding supplement (ENSURE ENLIVE / ENSURE PLUS) liquid 237 mL  237 mL Oral BID BM Blinda Turek T, MD   237 mL at 07/03/22 1314   hydrOXYzine (ATARAX) tablet 25 mg  25 mg Oral TID PRN Sherlon Handing, NP   25 mg at 07/01/22 0902   lithium carbonate (LITHOBID) CR tablet 600 mg  600 mg  Oral Q12H Riggin Cuttino T, MD   600 mg at 07/04/22 7943   magnesium hydroxide (MILK OF MAGNESIA) suspension 30 mL  30 mL Oral Daily PRN Waldon Merl F, NP       nicotine (NICODERM CQ - dosed in mg/24 hours) patch 14 mg  14 mg Transdermal Daily Subhan Hoopes T, MD       QUEtiapine (SEROQUEL) tablet 400 mg  400 mg Oral QHS Semiyah Newgent T, MD   400 mg at 07/03/22 2229    Lab Results: No results found for this or any previous visit (from the past 48 hour(s)).  Blood Alcohol level:  Lab Results  Component Value Date   ETH <10 06/29/2022   ETH <10 27/61/4709    Metabolic Disorder Labs: Lab Results  Component Value Date   HGBA1C 4.9 12/25/2021   MPG 93.93 12/25/2021   MPG 114 06/02/2021   No results found for: "PROLACTIN" Lab Results  Component Value Date   CHOL 160 12/25/2021   TRIG 95 12/25/2021   HDL 58 12/25/2021   CHOLHDL 2.8 12/25/2021   VLDL 19 12/25/2021   LDLCALC 83 12/25/2021   LDLCALC 63 06/01/2021    Physical Findings: AIMS:  , ,  ,  ,    CIWA:    COWS:     Musculoskeletal: Strength & Muscle Tone: within normal limits Gait & Station: normal Patient leans: N/A  Psychiatric Specialty Exam:  Presentation  General Appearance: Bizarre  Eye Contact:Fair  Speech:-- (non-linear)  Speech Volume:Normal  Handedness:Right   Mood and Affect  Mood:Dysphoric  Affect:Blunt   Thought Process  Thought Processes:Disorganized  Descriptions of Associations:Loose  Orientation:Partial  Thought Content:Illogical  History of Schizophrenia/Schizoaffective disorder:Yes  Duration of Psychotic Symptoms:Greater than six months  Hallucinations:No data recorded Ideas of Reference:-- (uta)  Suicidal Thoughts:No data recorded Homicidal Thoughts:No data recorded  Sensorium  Memory:Immediate Poor  Judgment:Impaired  Insight:Lacking   Executive Functions  Concentration:Poor  Attention Span:Poor  Recall:Poor  Fund of  Knowledge:Poor  Language:Poor   Psychomotor Activity  Psychomotor Activity:No data recorded  Assets  Assets:Resilience   Sleep  Sleep:No data recorded   Physical Exam: Physical Exam Vitals and nursing note reviewed.  Constitutional:      Appearance: Normal appearance.  HENT:     Head: Normocephalic and atraumatic.     Mouth/Throat:     Pharynx: Oropharynx is clear.  Eyes:     Pupils: Pupils are equal,  round, and reactive to light.  Cardiovascular:     Rate and Rhythm: Normal rate and regular rhythm.  Pulmonary:     Effort: Pulmonary effort is normal.     Breath sounds: Normal breath sounds.  Abdominal:     General: Abdomen is flat.     Palpations: Abdomen is soft.  Musculoskeletal:        General: Normal range of motion.  Skin:    General: Skin is warm and dry.  Neurological:     General: No focal deficit present.     Mental Status: She is alert. Mental status is at baseline.  Psychiatric:        Attention and Perception: Attention normal.        Mood and Affect: Mood normal. Affect is blunt.        Speech: Speech is delayed.        Behavior: Behavior is slowed.        Thought Content: Thought content normal.        Cognition and Memory: Memory is impaired.    Review of Systems  Constitutional: Negative.   HENT: Negative.    Eyes: Negative.   Respiratory: Negative.    Cardiovascular: Negative.   Gastrointestinal: Negative.   Musculoskeletal: Negative.   Skin: Negative.   Neurological: Negative.   Psychiatric/Behavioral: Negative.     Blood pressure 95/62, pulse 76, temperature 98 F (36.7 C), temperature source Oral, resp. rate 18, height '5\' 1"'$  (1.549 m), weight 45 kg, last menstrual period 05/17/2022, SpO2 100 %. Body mass index is 18.74 kg/m.   Treatment Plan Summary: Medication management and Plan continue medication management.  Gradually go back up towards the full doses she had before.  Encourage group attendance.  Encourage eating and taking  care of her hygiene.  Alethia Berthold, MD 07/04/2022, 12:01 PM

## 2022-07-04 NOTE — Progress Notes (Signed)
Patient alert and oriented x 4 no distress noted, her affect is blunted thoughts are organized and speech is coherent, she appears less anxious no restlessness noted, she denies SI/HI/AVH she was offered emotional support. Patient was compliant with medication regimen 15 minutes safety checks maintained will continue to monitor closely.

## 2022-07-05 DIAGNOSIS — F3164 Bipolar disorder, current episode mixed, severe, with psychotic features: Secondary | ICD-10-CM | POA: Diagnosis not present

## 2022-07-05 NOTE — Progress Notes (Signed)
[facility] MD Progress Note  07/05/2022 11:08 AM Autumn [name]  MRN:  [id number] Subjective: Follow-up for this woman with bipolar disorder largely depressed.  Sleeping better.  Affect and mood better.  Still a little confused but oriented to situation.  Tolerating medicines which have been put back to their usual dosage Principal Problem: Bipolar affective disorder ([place]) Diagnosis: Principal Problem:   Bipolar affective disorder ([place])  Total Time spent with patient: 30 minutes  Past Psychiatric History: Past history of recurrent schizoaffective disorder complicated by substance use  Past Medical History:  Past Medical History:  Diagnosis Date   Allergy    Seasonal, Ultram (seizures)   Anemia 2005   Bipolar 1 disorder ([place])    Glaucoma    Seizures ([place]) 2008    Past Surgical History:  Procedure Laterality Date   BREAST EXCISIONAL BIOPSY Right    x 2   CESAREAN SECTION     x 2   LEEP  2000   Family History:  Family History  Problem Relation Age of Onset   Hypertension Mother    Hyperlipidemia Mother    Asthma Mother    Parkinson's disease Father    Lung cancer Maternal Grandmother    Other Paternal Grandfather 60       Cardiac arrest   Heart disease Paternal Grandfather    Tourette syndrome Daughter    Family Psychiatric  History: See previous Social History:  Social History   Substance and Sexual Activity  Alcohol Use Not Currently   Alcohol/week: 8.0 standard drinks of alcohol   Types: 8 Shots of liquor per week   Comment: last use 03/30/22 4x/yr     Social History   Substance and Sexual Activity  Drug Use Yes   Frequency: 1.0 times per week   Types: Marijuana, Cocaine, Heroin, Methamphetamines, MDMA (Ecstacy)   Comment: last use yesterday    Social History   Socioeconomic History   Marital status: Legally Separated    Spouse name: Not on file   Number of children: Not on file   Years of education: Not on file   Highest education level: Not on file   Occupational History   Not on file  Tobacco Use   Smoking status: Every Day    Packs/day: 0.50    Years: 9.00    Total pack years: 4.50    Types: E-cigarettes, Cigarettes, Cigars   Smokeless tobacco: Former    Types: Nurse, children's Use: Some days   Substances: Nicotine  Substance and Sexual Activity   Alcohol use: Not Currently    Alcohol/week: 8.0 standard drinks of alcohol    Types: 8 Shots of liquor per week    Comment: last use 03/30/22 4x/yr   Drug use: Yes    Frequency: 1.0 times per week    Types: Marijuana, Cocaine, Heroin, Methamphetamines, MDMA (Ecstacy)    Comment: last use yesterday   Sexual activity: Yes    Partners: Male    Birth control/protection: None  Other Topics Concern   Not on file  Social History Narrative   Not on file   Social Determinants of Health   Financial Resource Strain: Not on file  Food Insecurity: Not on file  Transportation Needs: Not on file  Physical Activity: Not on file  Stress: Not on file  Social Connections: Not on file   Additional Social History:  Sleep: Fair  Appetite:  Fair  Current Medications: Current Facility-Administered Medications  Medication Dose Route Frequency Provider Last Rate Last Admin   acetaminophen (TYLENOL) tablet 650 mg  650 mg Oral Q6H PRN Sherlon Handing, NP       alum & mag hydroxide-simeth (MAALOX/MYLANTA) 200-200-20 MG/5ML suspension 30 mL  30 mL Oral Q4H PRN Waldon Merl F, NP       ARIPiprazole (ABILIFY) tablet 15 mg  15 mg Oral Daily Quanah Majka T, MD   15 mg at 07/05/22 0811   divalproex (DEPAKOTE) DR tablet 750 mg  750 mg Oral Q12H Maleena Eddleman T, MD   750 mg at 07/05/22 9833   feeding supplement (ENSURE ENLIVE / ENSURE PLUS) liquid 237 mL  237 mL Oral BID BM Angel Weedon T, MD   237 mL at 07/04/22 1407   hydrOXYzine (ATARAX) tablet 25 mg  25 mg Oral TID PRN Sherlon Handing, NP   25 mg at 07/01/22 0902   lithium carbonate  (LITHOBID) CR tablet 600 mg  600 mg Oral Q12H Skye Rodarte T, MD   600 mg at 07/05/22 8250   magnesium hydroxide (MILK OF MAGNESIA) suspension 30 mL  30 mL Oral Daily PRN Waldon Merl F, NP       nicotine (NICODERM CQ - dosed in mg/24 hours) patch 14 mg  14 mg Transdermal Daily Alahna Dunne, [name], MD   14 mg at 07/05/22 5397   QUEtiapine (SEROQUEL) tablet 400 mg  400 mg Oral QHS Erisa Mehlman, [name], MD   400 mg at 07/04/22 2110    Lab Results: No results found for this or any previous visit (from the past 48 hour(s)).  Blood Alcohol level:  Lab Results  Component Value Date   ETH <10 06/29/2022   ETH <10 67/34/1937    Metabolic Disorder Labs: Lab Results  Component Value Date   HGBA1C 4.9 12/25/2021   MPG 93.93 12/25/2021   MPG 114 06/02/2021   No results found for: "PROLACTIN" Lab Results  Component Value Date   CHOL 160 12/25/2021   TRIG 95 12/25/2021   HDL 58 12/25/2021   CHOLHDL 2.8 12/25/2021   VLDL 19 12/25/2021   LDLCALC 83 12/25/2021   LDLCALC 63 06/01/2021    Physical Findings: AIMS:  , ,  ,  ,    CIWA:    COWS:     Musculoskeletal: Strength & Muscle Tone: within normal limits Gait & Station: normal Patient leans: N/A  Psychiatric Specialty Exam:  Presentation  General Appearance: Bizarre  Eye Contact:Fair  Speech:-- (non-linear)  Speech Volume:Normal  Handedness:Right   Mood and Affect  Mood:Dysphoric  Affect:Blunt   Thought Process  Thought Processes:Disorganized  Descriptions of Associations:Loose  Orientation:Partial  Thought Content:Illogical  History of Schizophrenia/Schizoaffective disorder:Yes  Duration of Psychotic Symptoms:Greater than six months  Hallucinations:No data recorded Ideas of Reference:-- (uta)  Suicidal Thoughts:No data recorded Homicidal Thoughts:No data recorded  Sensorium  Memory:Immediate Poor  Judgment:Impaired  Insight:Lacking   Executive Functions  Concentration:Poor  Attention  Span:Poor  Recall:Poor  Fund of Knowledge:Poor  Language:Poor   Psychomotor Activity  Psychomotor Activity:No data recorded  Assets  Assets:Resilience   Sleep  Sleep:No data recorded   Physical Exam: Physical Exam Vitals and nursing note reviewed.  Constitutional:      Appearance: Normal appearance.  HENT:     Head: Normocephalic and atraumatic.     Mouth/Throat:     Pharynx: Oropharynx is clear.  Eyes:     Pupils:  Pupils are equal, round, and reactive to light.  Cardiovascular:     Rate and Rhythm: Normal rate and regular rhythm.  Pulmonary:     Effort: Pulmonary effort is normal.     Breath sounds: Normal breath sounds.  Abdominal:     General: Abdomen is flat.     Palpations: Abdomen is soft.  Musculoskeletal:        General: Normal range of motion.  Skin:    General: Skin is warm and dry.  Neurological:     General: No focal deficit present.     Mental Status: She is alert. Mental status is at baseline.  Psychiatric:        Attention and Perception: She is inattentive.        Mood and Affect: Mood normal. Affect is blunt.        Behavior: Behavior is slowed.        Thought Content: Thought content normal.        Cognition and Memory: Memory is impaired.    Review of Systems  Constitutional: Negative.   HENT: Negative.    Eyes: Negative.   Respiratory: Negative.    Cardiovascular: Negative.   Gastrointestinal: Negative.   Musculoskeletal: Negative.   Skin: Negative.   Neurological: Negative.   Psychiatric/Behavioral:  Positive for depression.    Blood pressure (!) 87/64, pulse 67, temperature 98.3 F (36.8 C), temperature source Oral, resp. rate 18, height '5\' 1"'$  (1.549 m), weight 45 kg, last menstrual period 05/17/2022, SpO2 100 %. Body mass index is 18.74 kg/m.   Treatment Plan Summary: Medication management and Plan review medications.  2 mood stabilizers and antipsychotic.  Encourage patient to be eating and drinking well.  Participate in  groups when possible.  Alethia Berthold, MD 07/05/2022, 11:08 AM

## 2022-07-05 NOTE — Progress Notes (Signed)
Patient alert and oriented x 4 no distress noted, her affect is flat but brightens upon approach.  thoughts are organized and speech is coherent, she appears less anxious no restlessness noted, she denies SI/HI/AVH she was offered emotional support. Patient was compliant with medication regimen 15 minutes safety checks maintained will continue to monitor closely.

## 2022-07-05 NOTE — Plan of Care (Signed)
Patient stayed in bed most of the shift. No inappropriate behaviors noted. Denies SI,HI and AVH. Appetite and energy level good. Support and encouragement given.

## 2022-07-05 NOTE — BH IP Treatment Plan (Signed)
Interdisciplinary Treatment and Diagnostic Plan Update  07/05/2022 Time of Session: 08:30 Autumn [name] MRN: [id number]  Principal Diagnosis: Bipolar affective disorder [facility])  Secondary Diagnoses: Principal Problem:   Bipolar affective disorder ([place])   Current Medications:  Current Facility-Administered Medications  Medication Dose Route Frequency Provider Last Rate Last Admin   acetaminophen (TYLENOL) tablet 650 mg  650 mg Oral Q6H PRN Sherlon Handing, NP       alum & mag hydroxide-simeth (MAALOX/MYLANTA) 200-200-20 MG/5ML suspension 30 mL  30 mL Oral Q4H PRN Waldon Merl F, NP       ARIPiprazole (ABILIFY) tablet 15 mg  15 mg Oral Daily Clapacs, [name], MD   15 mg at 07/05/22 0811   divalproex (DEPAKOTE) DR tablet 750 mg  750 mg Oral Q12H Clapacs, [name], MD   750 mg at 07/05/22 0811   feeding supplement (ENSURE ENLIVE / ENSURE PLUS) liquid 237 mL  237 mL Oral BID BM Clapacs, [name], MD   237 mL at 07/04/22 1407   hydrOXYzine (ATARAX) tablet 25 mg  25 mg Oral TID PRN Sherlon Handing, NP   25 mg at 07/01/22 0902   lithium carbonate (LITHOBID) CR tablet 600 mg  600 mg Oral Q12H Clapacs, [name], MD   600 mg at 07/05/22 0076   magnesium hydroxide (MILK OF MAGNESIA) suspension 30 mL  30 mL Oral Daily PRN Waldon Merl F, NP       nicotine (NICODERM CQ - dosed in mg/24 hours) patch 14 mg  14 mg Transdermal Daily Clapacs, [name], MD   14 mg at 07/05/22 2263   QUEtiapine (SEROQUEL) tablet 400 mg  400 mg Oral QHS Clapacs, [name], MD   400 mg at 07/04/22 2110   PTA Medications: Medications Prior to Admission  Medication Sig Dispense Refill Last Dose   ARIPiprazole (ABILIFY) 15 MG tablet Take 1 tablet (15 mg total) by mouth daily. (Patient not taking: Reported on 05/29/2022) 30 tablet 0    divalproex (DEPAKOTE) 250 MG DR tablet Take 2 tablets (500 mg total) by mouth every 12 (twelve) hours. (Patient not taking: Reported on 05/29/2022) 120 tablet 0    lithium carbonate (LITHOBID) 300  MG CR tablet Take 2 tablets (600 mg total) by mouth every 12 (twelve) hours. (Patient not taking: Reported on 05/29/2022) 120 tablet 0    norethindrone (MICRONOR) 0.35 MG tablet Take 1 tablet (0.35 mg total) by mouth daily. (Patient not taking: Reported on 05/20/2022) 28 tablet 13    QUEtiapine (SEROQUEL) 200 MG tablet Take 1 tablet (200 mg total) by mouth at bedtime. (Patient not taking: Reported on 05/29/2022) 30 tablet 0     Patient Stressors:    Patient Strengths:    Treatment Modalities: Medication Management, Group therapy, Case management,  1 to 1 session with clinician, Psychoeducation, Recreational therapy.   Physician Treatment Plan for Primary Diagnosis: Bipolar affective disorder ([place]) Long Term Goal(s): Improvement in symptoms so as ready for discharge   Short Term Goals: Ability to identify and develop effective coping behaviors will improve Ability to maintain clinical measurements within normal limits will improve Compliance with prescribed medications will improve Ability to demonstrate self-control will improve  Medication Management: Evaluate patient's response, side effects, and tolerance of medication regimen.  Therapeutic Interventions: 1 to 1 sessions, Unit Group sessions and Medication administration.  Evaluation of Outcomes: Progressing  Physician Treatment Plan for Secondary Diagnosis: Principal Problem:   Bipolar affective disorder ([place])  Long Term Goal(s): Improvement in symptoms  so as ready for discharge   Short Term Goals: Ability to identify and develop effective coping behaviors will improve Ability to maintain clinical measurements within normal limits will improve Compliance with prescribed medications will improve Ability to demonstrate self-control will improve     Medication Management: Evaluate patient's response, side effects, and tolerance of medication regimen.  Therapeutic Interventions: 1 to 1 sessions, Unit Group sessions and Medication  administration.  Evaluation of Outcomes: Progressing   RN Treatment Plan for Primary Diagnosis: Bipolar affective disorder ([place]) Long Term Goal(s): Knowledge of disease and therapeutic regimen to maintain health will improve  Short Term Goals: Ability to remain free from injury will improve, Ability to verbalize frustration and anger appropriately will improve, Ability to demonstrate self-control, Ability to participate in decision making will improve, Ability to verbalize feelings will improve, Ability to disclose and discuss suicidal ideas, Ability to identify and develop effective coping behaviors will improve, and Compliance with prescribed medications will improve  Medication Management: RN will administer medications as ordered by provider, will assess and evaluate patient's response and provide education to patient for prescribed medication. RN will report any adverse and/or side effects to prescribing provider.  Therapeutic Interventions: 1 on 1 counseling sessions, Psychoeducation, Medication administration, Evaluate responses to treatment, Monitor vital signs and CBGs as ordered, Perform/monitor CIWA, COWS, AIMS and Fall Risk screenings as ordered, Perform wound care treatments as ordered.  Evaluation of Outcomes: Progressing   LCSW Treatment Plan for Primary Diagnosis: Bipolar affective disorder ([place]) Long Term Goal(s): Safe transition to appropriate next level of care at discharge, Engage patient in therapeutic group addressing interpersonal concerns.  Short Term Goals: Engage patient in aftercare planning with referrals and resources, Increase social support, Increase ability to appropriately verbalize feelings, Increase emotional regulation, Facilitate acceptance of mental health diagnosis and concerns, and Increase skills for wellness and recovery  Therapeutic Interventions: Assess for all discharge needs, 1 to 1 time with Social worker, Explore available resources and support  systems, Assess for adequacy in community support network, Educate family and significant other(s) on suicide prevention, Complete Psychosocial Assessment, Interpersonal group therapy.  Evaluation of Outcomes: Progressing   Progress in Treatment: Attending groups: No. Participating in groups: No. Taking medication as prescribed: Yes. Toleration medication: Yes. Family/Significant other contact made: No, will contact:  when given permission. Patient understands diagnosis: No. Discussing patient identified problems/goals with staff: No. Medical problems stabilized or resolved: Yes. Denies suicidal/homicidal ideation: No. Issues/concerns per patient self-inventory: No. Other: none.  New problem(s) identified: No, Describe:  none identified. 07/05/22 Update: No changes at this time.   New Short Term/Long Term Goal(s): detox, elimination of symptoms of psychosis, medication management for mood stabilization; elimination of SI thoughts; development of comprehensive mental wellness/sobriety plan. 07/05/22 Update: No changes at this time.   Patient Goals:  Pt declined to attend treatment team meeting. 07/05/22 Update: No changes at this time.   Discharge Plan or Barriers: CSW will assist pt with development of an appropriate aftercare/discharge plan. 07/05/22 Update: No changes at this time.     Reason for Continuation of Hospitalization: Medication stabilization Other; describe psychosis   Estimated Length of Stay: 1-7 days 07/05/22 Update: No changes at this time.  Last 3 [place] Suicide Severity Risk Score: Flowsheet Row Admission (Current) from 06/29/2022 in [place] Most recent reading at 06/29/2022  9:15 PM ED from 06/29/2022 in [place] Most recent reading at 06/29/2022  8:08 AM ED from 06/08/2022 in [place]  [place] Most recent reading at 06/08/2022  8:57 PM  C-SSRS RISK CATEGORY No  Risk No Risk No Risk       Last PHQ 2/9 Scores:    04/01/2022    1:26 PM  Depression screen PHQ 2/9  Decreased Interest 0  Down, Depressed, Hopeless 2  PHQ - 2 Score 2  Altered sleeping 3  Tired, decreased energy 3  Change in appetite 3  Feeling bad or failure about yourself  3  Trouble concentrating 3  Moving slowly or fidgety/restless 3  Suicidal thoughts 0  PHQ-9 Score 20  Difficult doing work/chores Somewhat difficult    Scribe for Treatment Team: Shirl Harris, LCSW 07/05/2022 1:26 PM

## 2022-07-05 NOTE — [facility] Group Notes (Signed)
[place] Group Notes:  (Nursing/MHT/Case Management/Adjunct)  Date:  07/05/2022  Time:  10:21 AM  Type of Therapy:   Community Meeting  Participation Level:  Did Not Attend   Adela Lank [facility] 07/05/2022, 10:21 AM

## 2022-07-05 NOTE — Progress Notes (Addendum)
Recreation Therapy Notes  Date: 07/05/2022   Time: 10:50 am     Location: Craft room    Behavioral response: N/A   Intervention Topic: Relaxation     Discussion/Intervention: Patient refused to attend group.    Clinical Observations/Feedback:  Patient refused to attend group.    Breyson Kelm LRT/CTRS        [place] 07/05/2022 12:24 PM

## 2022-07-05 NOTE — Progress Notes (Signed)
Patient pleasant and cooperative. Isolative to self and room. Denies SI, HI, aVH. Endorses depression. Refused snack tonight. Medication given and received as ordered. No complaints voiced. Encouragement and support provided. Safety checks maintained. Medications given as prescribed. Pt receptive and remains safe on unit with q 15 min checks.

## 2022-07-05 NOTE — Group Note (Signed)
[facility] LCSW Group Therapy Note    Group Date: 07/05/2022 Start Time: 1300 End Time: 1400  Type of Therapy and Topic:  Group Therapy:  Overcoming Obstacles  Participation Level:  [facility] PARTICIPATION LEVEL: Did Not Attend   Description of Group:   In this group patients will be encouraged to explore what they see as obstacles to their own wellness and recovery. They will be guided to discuss their thoughts, feelings, and behaviors related to these obstacles. The group will process together ways to cope with barriers, with attention given to specific choices patients can make. Each patient will be challenged to identify changes they are motivated to make in order to overcome their obstacles. This group will be process-oriented, with patients participating in exploration of their own experiences as well as giving and receiving support and challenge from other group members.  Therapeutic Goals: 1. Patient will identify personal and current obstacles as they relate to admission. 2. Patient will identify barriers that currently interfere with their wellness or overcoming obstacles.  3. Patient will identify feelings, thought process and behaviors related to these barriers. 4. Patient will identify two changes they are willing to make to overcome these obstacles:    Summary of Patient Progress Group not held due to acuity.   Therapeutic Modalities:   Cognitive Behavioral Therapy Solution Focused Therapy Motivational Interviewing Relapse Prevention Therapy   Shirl Harris, LCSW

## 2022-07-06 DIAGNOSIS — F3164 Bipolar disorder, current episode mixed, severe, with psychotic features: Secondary | ICD-10-CM | POA: Diagnosis not present

## 2022-07-06 NOTE — Progress Notes (Signed)
Recreation Therapy Notes   Date: 07/06/2022  Time: 10:15 am    Location: Courtyard     Behavioral response: N/A   Intervention Topic: Leisure     Discussion/Intervention: Patient refused to attend group.   Clinical Observations/Feedback:  Patient refused to attend group.    Ginevra Tacker LRT/CTRS        Chad Tiznado 07/06/2022 12:47 PM

## 2022-07-06 NOTE — Progress Notes (Signed)
Pt denies SI/HI/AVH and verbally agrees to approach staff if these become apparent or before harming themselves/others. Rates depression 0/10. Rates anxiety 0/10. Rates pain 0/10. Scheduled medications administered to pt, per MD orders. Pt has been in her room all day. Pt does get up for meals but has not been attending group. Pt denies anxiety but was fidgety when sitting down. Pt was seen on the phone after dinner. RN provided support and encouragement to pt. Q15 min safety checks implemented and continued. Pt safe on the unit. RN will continue to monitor and intervene as needed.   Problem: Activity: Goal: Risk for activity intolerance will decrease Outcome: Progressing   Problem: Coping: Goal: Level of anxiety will decrease Outcome: Progressing    07/06/22 0833  Psych Admission Type (Psych Patients Only)  Admission Status Involuntary  Psychosocial Assessment  Patient Complaints None  Eye Contact Fair  Facial Expression Sad;Anxious  Affect Anxious  Speech Logical/coherent  Interaction Forwards little;Minimal  Motor Activity Fidgety  Appearance/Hygiene In scrubs;Disheveled  Behavior Characteristics Cooperative;Appropriate to situation;Calm  Mood Pleasant  Thought Process  Coherency WDL  Content WDL  Delusions None reported or observed  Perception WDL  Hallucination None reported or observed  Judgment Impaired  Confusion None  Danger to Self  Current suicidal ideation? Denies  Danger to Others  Danger to Others None reported or observed

## 2022-07-06 NOTE — Progress Notes (Signed)
[facility] MD Progress Note  07/06/2022 4:43 PM Autumn [name]  MRN:  [id number] Subjective: Follow-up for this woman with bipolar or schizoaffective disorder.  She is up out of bed and eating normal affect quiet but interactive and mostly appropriate behavior. Principal Problem: Bipolar affective disorder ([place]) Diagnosis: Principal Problem:   Bipolar affective disorder ([place])  Total Time spent with patient: 30 minutes  Past Psychiatric History: Past history of bipolar or schizoaffective disorder complicated by substance abuse  Past Medical History:  Past Medical History:  Diagnosis Date   Allergy    Seasonal, Ultram (seizures)   Anemia 2005   Bipolar 1 disorder ([place])    Glaucoma    Seizures ([place]) 2008    Past Surgical History:  Procedure Laterality Date   BREAST EXCISIONAL BIOPSY Right    x 2   CESAREAN SECTION     x 2   LEEP  2000   Family History:  Family History  Problem Relation Age of Onset   Hypertension Mother    Hyperlipidemia Mother    Asthma Mother    Parkinson's disease Father    Lung cancer Maternal Grandmother    Other Paternal Grandfather 64       Cardiac arrest   Heart disease Paternal Grandfather    Tourette syndrome Daughter    Family Psychiatric  History: See previous Social History:  Social History   Substance and Sexual Activity  Alcohol Use Not Currently   Alcohol/week: 8.0 standard drinks of alcohol   Types: 8 Shots of liquor per week   Comment: last use 03/30/22 4x/yr     Social History   Substance and Sexual Activity  Drug Use Yes   Frequency: 1.0 times per week   Types: Marijuana, Cocaine, Heroin, Methamphetamines, MDMA (Ecstacy)   Comment: last use yesterday    Social History   Socioeconomic History   Marital status: Legally Separated    Spouse name: Not on file   Number of children: Not on file   Years of education: Not on file   Highest education level: Not on file  Occupational History   Not on file  Tobacco Use   Smoking  status: Every Day    Packs/day: 0.50    Years: 9.00    Total pack years: 4.50    Types: E-cigarettes, Cigarettes, Cigars   Smokeless tobacco: Former    Types: Nurse, children's Use: Some days   Substances: Nicotine  Substance and Sexual Activity   Alcohol use: Not Currently    Alcohol/week: 8.0 standard drinks of alcohol    Types: 8 Shots of liquor per week    Comment: last use 03/30/22 4x/yr   Drug use: Yes    Frequency: 1.0 times per week    Types: Marijuana, Cocaine, Heroin, Methamphetamines, MDMA (Ecstacy)    Comment: last use yesterday   Sexual activity: Yes    Partners: Male    Birth control/protection: None  Other Topics Concern   Not on file  Social History Narrative   Not on file   Social Determinants of Health   Financial Resource Strain: Not on file  Food Insecurity: Not on file  Transportation Needs: Not on file  Physical Activity: Not on file  Stress: Not on file  Social Connections: Not on file   Additional Social History:                         Sleep: Fair  Appetite:  Fair  Current Medications: Current Facility-Administered Medications  Medication Dose Route Frequency Provider Last Rate Last Admin   acetaminophen (TYLENOL) tablet 650 mg  650 mg Oral Q6H PRN Sherlon Handing, NP       alum & mag hydroxide-simeth (MAALOX/MYLANTA) 200-200-20 MG/5ML suspension 30 mL  30 mL Oral Q4H PRN Waldon Merl F, NP       ARIPiprazole (ABILIFY) tablet 15 mg  15 mg Oral Daily Skylier Kretschmer T, MD   15 mg at 07/06/22 0833   divalproex (DEPAKOTE) DR tablet 750 mg  750 mg Oral Q12H Maxmillian Carsey T, MD   750 mg at 07/06/22 4098   feeding supplement (ENSURE ENLIVE / ENSURE PLUS) liquid 237 mL  237 mL Oral BID BM Swati Granberry T, MD   237 mL at 07/06/22 1344   hydrOXYzine (ATARAX) tablet 25 mg  25 mg Oral TID PRN Sherlon Handing, NP   25 mg at 07/01/22 0902   lithium carbonate (LITHOBID) CR tablet 600 mg  600 mg Oral Q12H Ruford Dudzinski T, MD   600  mg at 07/06/22 1191   magnesium hydroxide (MILK OF MAGNESIA) suspension 30 mL  30 mL Oral Daily PRN Waldon Merl F, NP       nicotine (NICODERM CQ - dosed in mg/24 hours) patch 14 mg  14 mg Transdermal Daily Tomoko Sandra, [name], MD   14 mg at 07/05/22 4782   QUEtiapine (SEROQUEL) tablet 400 mg  400 mg Oral QHS Dorinda Stehr T, MD   400 mg at 07/05/22 2055    Lab Results: No results found for this or any previous visit (from the past 48 hour(s)).  Blood Alcohol level:  Lab Results  Component Value Date   ETH <10 06/29/2022   ETH <10 95/62/1308    Metabolic Disorder Labs: Lab Results  Component Value Date   HGBA1C 4.9 12/25/2021   MPG 93.93 12/25/2021   MPG 114 06/02/2021   No results found for: "PROLACTIN" Lab Results  Component Value Date   CHOL 160 12/25/2021   TRIG 95 12/25/2021   HDL 58 12/25/2021   CHOLHDL 2.8 12/25/2021   VLDL 19 12/25/2021   LDLCALC 83 12/25/2021   LDLCALC 63 06/01/2021    Physical Findings: AIMS: Facial and Oral Movements Muscles of Facial Expression: None, normal Lips and Perioral Area: None, normal Jaw: None, normal Tongue: None, normal,Extremity Movements Upper (arms, wrists, hands, fingers): None, normal Lower (legs, knees, ankles, toes): None, normal, Trunk Movements Neck, shoulders, hips: None, normal, Overall Severity Severity of abnormal movements (highest score from questions above): None, normal Incapacitation due to abnormal movements: None, normal Patient's awareness of abnormal movements (rate only patient's report): No Awareness, Dental Status Current problems with teeth and/or dentures?: No Does patient usually wear dentures?: No  CIWA:    COWS:     Musculoskeletal: Strength & Muscle Tone: within normal limits Gait & Station: normal Patient leans: N/A  Psychiatric Specialty Exam:  Presentation  General Appearance: Bizarre  Eye Contact:Fair  Speech:-- (non-linear)  Speech Volume:Normal  Handedness:Right   Mood  and Affect  Mood:Dysphoric  Affect:Blunt   Thought Process  Thought Processes:Disorganized  Descriptions of Associations:Loose  Orientation:Partial  Thought Content:Illogical  History of Schizophrenia/Schizoaffective disorder:Yes  Duration of Psychotic Symptoms:Greater than six months  Hallucinations:No data recorded Ideas of Reference:-- Pincus Badder)  Suicidal Thoughts:No data recorded Homicidal Thoughts:No data recorded  Sensorium  Memory:Immediate Poor  Judgment:Impaired  Insight:Lacking   Executive Functions  Concentration:Poor  Attention Span:Poor  Recall:Poor  Fund of Knowledge:Poor  Language:Poor   Psychomotor Activity  Psychomotor Activity:No data recorded  Assets  Assets:Resilience   Sleep  Sleep:No data recorded   Physical Exam: Physical Exam Vitals and nursing note reviewed.  Constitutional:      Appearance: Normal appearance.  HENT:     Head: Normocephalic and atraumatic.     Mouth/Throat:     Pharynx: Oropharynx is clear.  Eyes:     Pupils: Pupils are equal, round, and reactive to light.  Cardiovascular:     Rate and Rhythm: Normal rate and regular rhythm.  Pulmonary:     Effort: Pulmonary effort is normal.     Breath sounds: Normal breath sounds.  Abdominal:     General: Abdomen is flat.     Palpations: Abdomen is soft.  Musculoskeletal:        General: Normal range of motion.  Skin:    General: Skin is warm and dry.  Neurological:     General: No focal deficit present.     Mental Status: She is alert. Mental status is at baseline.  Psychiatric:        Mood and Affect: Mood normal.        Thought Content: Thought content normal.    Review of Systems  Constitutional: Negative.   HENT: Negative.    Eyes: Negative.   Respiratory: Negative.    Cardiovascular: Negative.   Gastrointestinal: Negative.   Musculoskeletal: Negative.   Skin: Negative.   Neurological: Negative.   Psychiatric/Behavioral: Negative.     Blood  pressure (!) 87/67, pulse 71, temperature 97.7 F (36.5 C), temperature source Oral, resp. rate 18, height '5\' 1"'$  (1.549 m), weight 45 kg, last menstrual period 05/17/2022, SpO2 100 %. Body mass index is 18.74 kg/m.   Treatment Plan Summary: Medication management and Plan doing much better.  No indication to change medicine.  Lots of encouragement.  Watching her day by day if she is getting more lucid we can start talking about follow-up options  Alethia Berthold, MD 07/06/2022, 4:43 PM

## 2022-07-06 NOTE — Plan of Care (Signed)
  Problem: Education: Goal: Knowledge of General Education information will improve Description: Including pain rating scale, medication(s)/side effects and non-pharmacologic comfort measures Outcome: Progressing   Problem: Health Behavior/Discharge Planning: Goal: Ability to manage health-related needs will improve Outcome: Progressing   Problem: Clinical Measurements: Goal: Ability to maintain clinical measurements within normal limits will improve Outcome: Progressing Goal: Will remain free from infection Outcome: Progressing Goal: Diagnostic test results will improve Outcome: Progressing Goal: Respiratory complications will improve Outcome: Progressing Goal: Cardiovascular complication will be avoided Outcome: Progressing   Problem: Safety: Goal: Periods of time without injury will increase Outcome: Progressing   Problem: Physical Regulation: Goal: Ability to maintain clinical measurements within normal limits will improve Outcome: Progressing   Problem: Health Behavior/Discharge Planning: Goal: Identification of resources available to assist in meeting health care needs will improve Outcome: Progressing Goal: Compliance with treatment plan for underlying cause of condition will improve Outcome: Progressing   Problem: Coping: Goal: Ability to verbalize frustrations and anger appropriately will improve Outcome: Progressing Goal: Ability to demonstrate self-control will improve Outcome: Progressing   Problem: Activity: Goal: Interest or engagement in activities will improve Outcome: Progressing Goal: Sleeping patterns will improve Outcome: Progressing   Problem: Education: Goal: Mental status will improve Outcome: Progressing   Problem: Education: Goal: Knowledge of [place] [facility] Education information/materials will improve Outcome: Progressing Goal: Emotional status will improve Outcome: Progressing Goal: Mental status will improve Outcome:  Progressing Goal: Verbalization of understanding the information provided will improve Outcome: Progressing

## 2022-07-06 NOTE — Group Note (Signed)
LCSW Group Therapy Note  Group Date: 07/06/2022 Start Time: 1300 End Time: 1400   Type of Therapy and Topic:  Group Therapy: Using "I" Statements  Participation Level:  Did Not Attend  Description of Group:  Patients were asked to provide details of some interpersonal conflicts they have experienced. Patients were then educated about "I" statements, communication which focuses on feelings or views of the speaker rather than what the other person is doing. T group members were asked to reflect on past conflicts and to provide specific examples for utilizing "I" statements.  Therapeutic Goals:  Patients will verbalize understanding of ineffective communication and effective communication. Patients will be able to empathize with whom they are having conflict. Patients will practice effective communication in the form of "I" statements.    Summary of Patient Progress:    Patient did not attend group despite encouraged participation.   Therapeutic Modalities:   Cognitive Behavioral Therapy Solution-Focused Therapy    Durenda Hurt, [place] 07/06/2022  2:18 PM

## 2022-07-06 NOTE — Plan of Care (Signed)
  Problem: Safety: Goal: Ability to remain free from injury will improve Outcome: Progressing   Problem: Education: Goal: Mental status will improve Outcome: Progressing   Problem: Safety: Goal: Periods of time without injury will increase Outcome: Progressing

## 2022-07-07 DIAGNOSIS — F3164 Bipolar disorder, current episode mixed, severe, with psychotic features: Secondary | ICD-10-CM | POA: Diagnosis not present

## 2022-07-07 MED ORDER — SENNOSIDES-DOCUSATE SODIUM 8.6-50 MG PO TABS: 2.0000 | ORAL_TABLET | Freq: Every day | ORAL | Status: DC | PRN

## 2022-07-07 NOTE — Progress Notes (Signed)
Patient has been isolative to her room since the start of the shift.  She did come out for snack and meds.  Her interaction has been minimal with both staff and peers which is not her normal.  She denies si hi avh depression and anxiety. She presents flat and depressed despite what she reports. She is med complaint and received her meds without incident, before returning to her room.  Will continue to offer support and encouragement.  Q 15 minute safety checks in place.    C Butler-Nicholson, LPN

## 2022-07-07 NOTE — Group Note (Signed)
[facility] LCSW Group Therapy Note   Group Date: 07/07/2022 Start Time: 1300 End Time: 1400   Type of Therapy/Topic:  Group Therapy:  Emotion Regulation  Participation Level:  Did Not Attend    Description of Group:    The purpose of this group is to assist patients in learning to regulate negative emotions and experience positive emotions. Patients will be guided to discuss ways in which they have been vulnerable to their negative emotions. These vulnerabilities will be juxtaposed with experiences of positive emotions or situations, and patients challenged to use positive emotions to combat negative ones. Special emphasis will be placed on coping with negative emotions in conflict situations, and patients will process healthy conflict resolution skills.  Therapeutic Goals: Patient will identify two positive emotions or experiences to reflect on in order to balance out negative emotions:  Patient will label two or more emotions that they find the most difficult to experience:  Patient will be able to demonstrate positive conflict resolution skills through discussion or role plays:   Summary of Patient Progress: Pt declined group despite invitation.     Therapeutic Modalities:   Cognitive Behavioral Therapy Feelings Identification Dialectical Behavioral Therapy   Shirl Harris, LCSW

## 2022-07-07 NOTE — [facility] Group Notes (Signed)
[facility] Group Notes:  (Nursing/MHT/Case Management/Adjunct)  Date:  07/07/2022  Time:  6:55 AM  Type of Therapy:   wrap up group  Participation Level:  Did Not Attend    Summary of Progress/Problems:  Autumn [name] 07/07/2022, 6:55 AM

## 2022-07-07 NOTE — Plan of Care (Signed)
D: Pt alert and oriented. Pt rates depression 0/10, hopelessness 0/10, and anxiety 0/10. Pt goal: "Go to groups." Pt reports energy level as low and concentration as being good. Pt reports sleep last night as being good. Pt did not receive medications for sleep. Pt denies experiencing any pain at this time. Pt denies experiencing any SI/HI, or AVH at this time.   A: Scheduled medications administered to pt, per MD orders. Support and encouragement provided. Frequent verbal contact made. Routine safety checks conducted q15 minutes.   R: No adverse drug reactions noted. Pt verbally contracts for safety at this time. Pt compliant with medications and treatment plan. Pt interacts minimally with others on the unit. Pt remains safe at this time. Plan of care ongoing.   Problem: Nutrition: Goal: Adequate nutrition will be maintained Outcome: Progressing   Problem: Coping: Goal: Level of anxiety will decrease Outcome: Progressing

## 2022-07-07 NOTE — Progress Notes (Signed)
Recreation Therapy Notes  Date: 07/07/2022  Time: 11:05 am    Location: Courtyard     Behavioral response: N/A   Intervention Topic: Social Skills    Discussion/Intervention: Patient refused to attend group.   Clinical Observations/Feedback:  Patient refused to attend group.    Hanish Laraia LRT/CTRS        Emmaleah Meroney 07/07/2022 12:45 PM

## 2022-07-07 NOTE — Progress Notes (Signed)
[facility] MD Progress Note  07/07/2022 3:42 PM Autumn [name]  MRN:  [id number] Subjective: Follow-up patient with bipolar disorder.  Patient seen and chart reviewed.  Patient was calm and cooperative made good eye contact and lucid conversation.  Still having some hallucinations at times but has not had any major mood swings evident or behavior problems in the last day.  Tolerating medicine adequately.  Eating better. Principal Problem: Bipolar affective disorder ([place]) Diagnosis: Principal Problem:   Bipolar affective disorder ([place])  Total Time spent with patient: 30 minutes  Past Psychiatric History: Past history of bipolar disorder  Past Medical History:  Past Medical History:  Diagnosis Date   Allergy    Seasonal, Ultram (seizures)   Anemia 2005   Bipolar 1 disorder ([place])    Glaucoma    Seizures ([place]) 2008    Past Surgical History:  Procedure Laterality Date   BREAST EXCISIONAL BIOPSY Right    x 2   CESAREAN SECTION     x 2   LEEP  2000   Family History:  Family History  Problem Relation Age of Onset   Hypertension Mother    Hyperlipidemia Mother    Asthma Mother    Parkinson's disease Father    Lung cancer Maternal Grandmother    Other Paternal Grandfather 61       Cardiac arrest   Heart disease Paternal Grandfather    Tourette syndrome Daughter    Family Psychiatric  History: See previous Social History:  Social History   Substance and Sexual Activity  Alcohol Use Not Currently   Alcohol/week: 8.0 standard drinks of alcohol   Types: 8 Shots of liquor per week   Comment: last use 03/30/22 4x/yr     Social History   Substance and Sexual Activity  Drug Use Yes   Frequency: 1.0 times per week   Types: Marijuana, Cocaine, Heroin, Methamphetamines, MDMA (Ecstacy)   Comment: last use yesterday    Social History   Socioeconomic History   Marital status: Legally Separated    Spouse name: Not on file   Number of children: Not on file   Years of education: Not  on file   Highest education level: Not on file  Occupational History   Not on file  Tobacco Use   Smoking status: Every Day    Packs/day: 0.50    Years: 9.00    Total pack years: 4.50    Types: E-cigarettes, Cigarettes, Cigars   Smokeless tobacco: Former    Types: Nurse, children's Use: Some days   Substances: Nicotine  Substance and Sexual Activity   Alcohol use: Not Currently    Alcohol/week: 8.0 standard drinks of alcohol    Types: 8 Shots of liquor per week    Comment: last use 03/30/22 4x/yr   Drug use: Yes    Frequency: 1.0 times per week    Types: Marijuana, Cocaine, Heroin, Methamphetamines, MDMA (Ecstacy)    Comment: last use yesterday   Sexual activity: Yes    Partners: Male    Birth control/protection: None  Other Topics Concern   Not on file  Social History Narrative   Not on file   Social Determinants of Health   Financial Resource Strain: Not on file  Food Insecurity: Not on file  Transportation Needs: Not on file  Physical Activity: Not on file  Stress: Not on file  Social Connections: Not on file   Additional Social History:  Sleep: Fair  Appetite:  Fair  Current Medications: Current Facility-Administered Medications  Medication Dose Route Frequency Provider Last Rate Last Admin   acetaminophen (TYLENOL) tablet 650 mg  650 mg Oral Q6H PRN Sherlon Handing, NP       alum & mag hydroxide-simeth (MAALOX/MYLANTA) 200-200-20 MG/5ML suspension 30 mL  30 mL Oral Q4H PRN Waldon Merl F, NP       ARIPiprazole (ABILIFY) tablet 15 mg  15 mg Oral Daily Janari Yamada T, MD   15 mg at 07/07/22 0816   divalproex (DEPAKOTE) DR tablet 750 mg  750 mg Oral Q12H Uldine Fuster T, MD   750 mg at 07/07/22 0817   feeding supplement (ENSURE ENLIVE / ENSURE PLUS) liquid 237 mL  237 mL Oral BID BM Zhanna Melin T, MD   237 mL at 07/07/22 1325   hydrOXYzine (ATARAX) tablet 25 mg  25 mg Oral TID PRN Sherlon Handing, NP   25  mg at 07/01/22 0902   lithium carbonate (LITHOBID) CR tablet 600 mg  600 mg Oral Q12H Marnisha Stampley T, MD   600 mg at 07/07/22 0816   magnesium hydroxide (MILK OF MAGNESIA) suspension 30 mL  30 mL Oral Daily PRN Waldon Merl F, NP       nicotine (NICODERM CQ - dosed in mg/24 hours) patch 14 mg  14 mg Transdermal Daily Aryanah Enslow, [name], MD   14 mg at 07/05/22 4854   QUEtiapine (SEROQUEL) tablet 400 mg  400 mg Oral QHS Jemmie Ledgerwood, [name], MD   400 mg at 07/06/22 2054    Lab Results: No results found for this or any previous visit (from the past 48 hour(s)).  Blood Alcohol level:  Lab Results  Component Value Date   ETH <10 06/29/2022   ETH <10 62/70/3500    Metabolic Disorder Labs: Lab Results  Component Value Date   HGBA1C 4.9 12/25/2021   MPG 93.93 12/25/2021   MPG 114 06/02/2021   No results found for: "PROLACTIN" Lab Results  Component Value Date   CHOL 160 12/25/2021   TRIG 95 12/25/2021   HDL 58 12/25/2021   CHOLHDL 2.8 12/25/2021   VLDL 19 12/25/2021   LDLCALC 83 12/25/2021   LDLCALC 63 06/01/2021    Physical Findings: AIMS: Facial and Oral Movements Muscles of Facial Expression: None, normal Lips and Perioral Area: None, normal Jaw: None, normal Tongue: None, normal,Extremity Movements Upper (arms, wrists, hands, fingers): None, normal Lower (legs, knees, ankles, toes): None, normal, Trunk Movements Neck, shoulders, hips: None, normal, Overall Severity Severity of abnormal movements (highest score from questions above): None, normal Incapacitation due to abnormal movements: None, normal Patient's awareness of abnormal movements (rate only patient's report): No Awareness, Dental Status Current problems with teeth and/or dentures?: No Does patient usually wear dentures?: No  CIWA:    COWS:     Musculoskeletal: Strength & Muscle Tone: within normal limits Gait & Station: normal Patient leans: N/A  Psychiatric Specialty Exam:  Presentation  General  Appearance: Bizarre  Eye Contact:Fair  Speech:-- (non-linear)  Speech Volume:Normal  Handedness:Right   Mood and Affect  Mood:Dysphoric  Affect:Blunt   Thought Process  Thought Processes:Disorganized  Descriptions of Associations:Loose  Orientation:Partial  Thought Content:Illogical  History of Schizophrenia/Schizoaffective disorder:Yes  Duration of Psychotic Symptoms:Greater than six months  Hallucinations:No data recorded Ideas of Reference:-- (uta)  Suicidal Thoughts:No data recorded Homicidal Thoughts:No data recorded  Sensorium  Memory:Immediate Poor  Judgment:Impaired  Insight:Lacking   Executive Functions  Concentration:Poor  Attention Span:Poor  Recall:Poor  Fund of Knowledge:Poor  Language:Poor   Psychomotor Activity  Psychomotor Activity:No data recorded  Assets  Assets:Resilience   Sleep  Sleep:No data recorded   Physical Exam: Physical Exam Vitals and nursing note reviewed.  Constitutional:      Appearance: Normal appearance.  HENT:     Head: Normocephalic and atraumatic.     Mouth/Throat:     Pharynx: Oropharynx is clear.  Eyes:     Pupils: Pupils are equal, round, and reactive to light.  Cardiovascular:     Rate and Rhythm: Normal rate and regular rhythm.  Pulmonary:     Effort: Pulmonary effort is normal.     Breath sounds: Normal breath sounds.  Abdominal:     General: Abdomen is flat.     Palpations: Abdomen is soft.  Musculoskeletal:        General: Normal range of motion.  Skin:    General: Skin is warm and dry.  Neurological:     General: No focal deficit present.     Mental Status: She is alert. Mental status is at baseline.  Psychiatric:        Attention and Perception: Attention normal. She is attentive.        Mood and Affect: Mood normal. Affect is blunt.        Speech: Speech is delayed.        Behavior: Behavior is cooperative.        Thought Content: Thought content normal.        Cognition  and Memory: Cognition normal. Memory is impaired.    Review of Systems  Constitutional: Negative.   HENT: Negative.    Eyes: Negative.   Respiratory: Negative.    Cardiovascular: Negative.   Gastrointestinal: Negative.   Musculoskeletal: Negative.   Skin: Negative.   Neurological: Negative.   Psychiatric/Behavioral:  Positive for depression. Negative for substance abuse and suicidal ideas.    Blood pressure (!) 90/59, pulse 68, temperature 98.7 F (37.1 C), temperature source Oral, resp. rate 18, height '5\' 1"'$  (1.549 m), weight 45 kg, last menstrual period 05/17/2022, SpO2 100 %. Body mass index is 18.74 kg/m.   Treatment Plan Summary: Medication management and Plan no change to medicine which is gradually taking effect.  Patient is talking about going to a group home.  Ideally this would be an excellent idea but unfortunately the patient does not receive disability or any other kind of financial support so we will once again be in a difficult situation when it is time for discharge.  For now focus on improving mental state.  Alethia Berthold, MD 07/07/2022, 3:42 PM

## 2022-07-08 DIAGNOSIS — F3164 Bipolar disorder, current episode mixed, severe, with psychotic features: Secondary | ICD-10-CM | POA: Diagnosis not present

## 2022-07-08 NOTE — Progress Notes (Signed)
Pt presents with depressed mood, affect congruent. Autumn [name] reports she slept well and is '' feeling fine. '' Although she appears depressed, withdrawn. Pt has been isolative to her room, only exiting for meals at this time. She denies any SI or HI with Probation officer. Compliant with am medications. Denies any acute concerns and is able to make her needs known. Pt is safe, will con't to monitor.

## 2022-07-08 NOTE — Progress Notes (Signed)
Recreation Therapy Notes  Date: 07/08/2022   Time: 10:50 am    Location: Craft room      Behavioral response: N/A   Intervention Topic: Decision Making     Discussion/Intervention: Patient refused to attend group.    Clinical Observations/Feedback:  Patient refused to attend group.    Pierrette Scheu LRT/CTRS         Zelta Enfield 07/08/2022 11:28 AM

## 2022-07-08 NOTE — Group Note (Signed)
[facility] LCSW Group Therapy Note   Group Date: 07/08/2022 Start Time: 1310 End Time: 1315   Type of Therapy/Topic:  Group Therapy:  Balance in Life  Participation Level:  Did Not Attend   Description of Group:    This group will address the concept of balance and how it feels and looks when one is unbalanced. Patients will be encouraged to process areas in their lives that are out of balance, and identify reasons for remaining unbalanced. Facilitators will guide patients utilizing problem- solving interventions to address and correct the stressor making their life unbalanced. Understanding and applying boundaries will be explored and addressed for obtaining  and maintaining a balanced life. Patients will be encouraged to explore ways to assertively make their unbalanced needs known to significant others in their lives, using other group members and facilitator for support and feedback.  Therapeutic Goals: Patient will identify two or more emotions or situations they have that consume much of in their lives. Patient will identify signs/triggers that life has become out of balance:  Patient will identify two ways to set boundaries in order to achieve balance in their lives:  Patient will demonstrate ability to communicate their needs through discussion and/or role plays  Summary of Patient Progress:    Group unable to be held.  CSW provided patient with worksheets.  CSW assessed for any needs and encouraged patient to follow up if additional needs are identified.     Therapeutic Modalities:   Cognitive Behavioral Therapy Solution-Focused Therapy Assertiveness Training   Rozann Lesches, LCSW

## 2022-07-08 NOTE — Progress Notes (Signed)
Patient is isolative to her room. She does come out for meals and medication.  She denies si hi avh depression and anxiety, but presents depressed. She is med compliant and received her meds without incident.  Support and encouragement offered.  Will continue to monitor with q 15 minute safety checks.   C butler-Nicholson, LPN

## 2022-07-08 NOTE — Plan of Care (Signed)
  Problem: Education: Goal: Knowledge of General Education information will improve Description: Including pain rating scale, medication(s)/side effects and non-pharmacologic comfort measures Outcome: Progressing   Problem: Health Behavior/Discharge Planning: Goal: Ability to manage health-related needs will improve Outcome: Progressing   Problem: Clinical Measurements: Goal: Ability to maintain clinical measurements within normal limits will improve Outcome: Progressing Goal: Will remain free from infection Outcome: Progressing Goal: Diagnostic test results will improve Outcome: Progressing Goal: Respiratory complications will improve Outcome: Progressing Goal: Cardiovascular complication will be avoided Outcome: Progressing   Problem: Safety: Goal: Periods of time without injury will increase Outcome: Progressing   Problem: Physical Regulation: Goal: Ability to maintain clinical measurements within normal limits will improve Outcome: Progressing   Problem: Health Behavior/Discharge Planning: Goal: Identification of resources available to assist in meeting health care needs will improve Outcome: Progressing Goal: Compliance with treatment plan for underlying cause of condition will improve Outcome: Progressing   Problem: Coping: Goal: Ability to verbalize frustrations and anger appropriately will improve Outcome: Progressing Goal: Ability to demonstrate self-control will improve Outcome: Progressing   Problem: Activity: Goal: Interest or engagement in activities will improve Outcome: Progressing Goal: Sleeping patterns will improve Outcome: Progressing

## 2022-07-08 NOTE — Progress Notes (Signed)
[facility] MD Progress Note  07/08/2022 2:06 PM Autumn [name]  MRN:  [id number] Subjective: Follow-up 46 year old woman with bipolar disorder mixed episode.  Patient says she is feeling better today.  No hallucinations today.  Mood still depressed and feeling very tired and confused but behavior much better Principal Problem: Bipolar affective disorder ([place]) Diagnosis: Principal Problem:   Bipolar affective disorder ([place])  Total Time spent with patient: 30 minutes  Past Psychiatric History: Past history of bipolar disorder and substance use disorder  Past Medical History:  Past Medical History:  Diagnosis Date   Allergy    Seasonal, Ultram (seizures)   Anemia 2005   Bipolar 1 disorder ([place])    Glaucoma    Seizures ([place]) 2008    Past Surgical History:  Procedure Laterality Date   BREAST EXCISIONAL BIOPSY Right    x 2   CESAREAN SECTION     x 2   LEEP  2000   Family History:  Family History  Problem Relation Age of Onset   Hypertension Mother    Hyperlipidemia Mother    Asthma Mother    Parkinson's disease Father    Lung cancer Maternal Grandmother    Other Paternal Grandfather 67       Cardiac arrest   Heart disease Paternal Grandfather    Tourette syndrome Daughter    Family Psychiatric  History: See previous Social History:  Social History   Substance and Sexual Activity  Alcohol Use Not Currently   Alcohol/week: 8.0 standard drinks of alcohol   Types: 8 Shots of liquor per week   Comment: last use 03/30/22 4x/yr     Social History   Substance and Sexual Activity  Drug Use Yes   Frequency: 1.0 times per week   Types: Marijuana, Cocaine, Heroin, Methamphetamines, MDMA (Ecstacy)   Comment: last use yesterday    Social History   Socioeconomic History   Marital status: Legally Separated    Spouse name: Not on file   Number of children: Not on file   Years of education: Not on file   Highest education level: Not on file  Occupational History   Not on file   Tobacco Use   Smoking status: Every Day    Packs/day: 0.50    Years: 9.00    Total pack years: 4.50    Types: E-cigarettes, Cigarettes, Cigars   Smokeless tobacco: Former    Types: Nurse, children's Use: Some days   Substances: Nicotine  Substance and Sexual Activity   Alcohol use: Not Currently    Alcohol/week: 8.0 standard drinks of alcohol    Types: 8 Shots of liquor per week    Comment: last use 03/30/22 4x/yr   Drug use: Yes    Frequency: 1.0 times per week    Types: Marijuana, Cocaine, Heroin, Methamphetamines, MDMA (Ecstacy)    Comment: last use yesterday   Sexual activity: Yes    Partners: Male    Birth control/protection: None  Other Topics Concern   Not on file  Social History Narrative   Not on file   Social Determinants of Health   Financial Resource Strain: Not on file  Food Insecurity: Not on file  Transportation Needs: Not on file  Physical Activity: Not on file  Stress: Not on file  Social Connections: Not on file   Additional Social History:  Sleep: Fair  Appetite:  Fair  Current Medications: Current Facility-Administered Medications  Medication Dose Route Frequency Provider Last Rate Last Admin   acetaminophen (TYLENOL) tablet 650 mg  650 mg Oral Q6H PRN Sherlon Handing, NP       alum & mag hydroxide-simeth (MAALOX/MYLANTA) 200-200-20 MG/5ML suspension 30 mL  30 mL Oral Q4H PRN Waldon Merl F, NP       ARIPiprazole (ABILIFY) tablet 15 mg  15 mg Oral Daily Morrell Fluke T, MD   15 mg at 07/08/22 0811   divalproex (DEPAKOTE) DR tablet 750 mg  750 mg Oral Q12H Myana Schlup T, MD   750 mg at 07/08/22 4709   feeding supplement (ENSURE ENLIVE / ENSURE PLUS) liquid 237 mL  237 mL Oral BID BM Chanel Mckesson T, MD   237 mL at 07/08/22 1352   hydrOXYzine (ATARAX) tablet 25 mg  25 mg Oral TID PRN Sherlon Handing, NP   25 mg at 07/01/22 0902   lithium carbonate (LITHOBID) CR tablet 600 mg  600 mg Oral Q12H  Zyrah Wiswell T, MD   600 mg at 07/08/22 6283   magnesium hydroxide (MILK OF MAGNESIA) suspension 30 mL  30 mL Oral Daily PRN Waldon Merl F, NP       nicotine (NICODERM CQ - dosed in mg/24 hours) patch 14 mg  14 mg Transdermal Daily Samanthajo Payano, [name], MD   14 mg at 07/05/22 6629   QUEtiapine (SEROQUEL) tablet 400 mg  400 mg Oral QHS Harrie Cazarez T, MD   400 mg at 07/07/22 2131   senna-docusate (Senokot-S) tablet 2 tablet  2 tablet Oral Daily PRN Teesha Ohm, [name], MD        Lab Results: No results found for this or any previous visit (from the past 48 hour(s)).  Blood Alcohol level:  Lab Results  Component Value Date   ETH <10 06/29/2022   ETH <10 47/65/4650    Metabolic Disorder Labs: Lab Results  Component Value Date   HGBA1C 4.9 12/25/2021   MPG 93.93 12/25/2021   MPG 114 06/02/2021   No results found for: "PROLACTIN" Lab Results  Component Value Date   CHOL 160 12/25/2021   TRIG 95 12/25/2021   HDL 58 12/25/2021   CHOLHDL 2.8 12/25/2021   VLDL 19 12/25/2021   LDLCALC 83 12/25/2021   LDLCALC 63 06/01/2021    Physical Findings: AIMS: Facial and Oral Movements Muscles of Facial Expression: None, normal Lips and Perioral Area: None, normal Jaw: None, normal Tongue: None, normal,Extremity Movements Upper (arms, wrists, hands, fingers): None, normal Lower (legs, knees, ankles, toes): None, normal, Trunk Movements Neck, shoulders, hips: None, normal, Overall Severity Severity of abnormal movements (highest score from questions above): None, normal Incapacitation due to abnormal movements: None, normal Patient's awareness of abnormal movements (rate only patient's report): No Awareness, Dental Status Current problems with teeth and/or dentures?: No Does patient usually wear dentures?: No  CIWA:    COWS:     Musculoskeletal: Strength & Muscle Tone: within normal limits Gait & Station: normal Patient leans: N/A  Psychiatric Specialty Exam:  Presentation   General Appearance: Bizarre  Eye Contact:Fair  Speech:-- (non-linear)  Speech Volume:Normal  Handedness:Right   Mood and Affect  Mood:Dysphoric  Affect:Blunt   Thought Process  Thought Processes:Disorganized  Descriptions of Associations:Loose  Orientation:Partial  Thought Content:Illogical  History of Schizophrenia/Schizoaffective disorder:Yes  Duration of Psychotic Symptoms:Greater than six months  Hallucinations:No data recorded Ideas of Reference:-- (uta)  Suicidal Thoughts:No data  recorded Homicidal Thoughts:No data recorded  Sensorium  Memory:Immediate Poor  Judgment:Impaired  Insight:Lacking   Executive Functions  Concentration:Poor  Attention Span:Poor  Recall:Poor  Fund of Knowledge:Poor  Language:Poor   Psychomotor Activity  Psychomotor Activity:No data recorded  Assets  Assets:Resilience   Sleep  Sleep:No data recorded   Physical Exam: Physical Exam Vitals and nursing note reviewed.  Constitutional:      Appearance: Normal appearance.  HENT:     Head: Normocephalic and atraumatic.     Mouth/Throat:     Pharynx: Oropharynx is clear.  Eyes:     Pupils: Pupils are equal, round, and reactive to light.  Cardiovascular:     Rate and Rhythm: Normal rate and regular rhythm.  Pulmonary:     Effort: Pulmonary effort is normal.     Breath sounds: Normal breath sounds.  Abdominal:     General: Abdomen is flat.     Palpations: Abdomen is soft.  Musculoskeletal:        General: Normal range of motion.  Skin:    General: Skin is warm and dry.  Neurological:     General: No focal deficit present.     Mental Status: She is alert. Mental status is at baseline.  Psychiatric:        Attention and Perception: She is inattentive.        Mood and Affect: Mood normal. Affect is blunt.        Speech: Speech is delayed.        Behavior: Behavior is slowed.        Thought Content: Thought content is paranoid. Thought content does not  include suicidal ideation.        Cognition and Memory: Memory is impaired.    Review of Systems  Constitutional: Negative.   HENT: Negative.    Eyes: Negative.   Respiratory: Negative.    Cardiovascular: Negative.   Gastrointestinal: Negative.   Musculoskeletal: Negative.   Skin: Negative.   Neurological: Negative.   Psychiatric/Behavioral:  Positive for depression. The patient is nervous/anxious.    Blood pressure (!) 96/58, pulse 67, temperature 98.5 F (36.9 C), temperature source Oral, resp. rate 18, height '5\' 1"'$  (1.549 m), weight 45 kg, last menstrual period 05/17/2022, SpO2 99 %. Body mass index is 18.74 kg/m.   Treatment Plan Summary: Medication management and Plan no change to medication management.  Supportive therapy and psychoeducation.  Encourage getting out of bed and interacting more.  Alethia Berthold, MD 07/08/2022, 2:06 PM

## 2022-07-09 DIAGNOSIS — F3164 Bipolar disorder, current episode mixed, severe, with psychotic features: Secondary | ICD-10-CM | POA: Diagnosis not present

## 2022-07-09 MED ORDER — LITHIUM CARBONATE ER 300 MG PO TBCR
300.0000 mg | EXTENDED_RELEASE_TABLET | Freq: Every day | ORAL | Status: DC
  Administered 2022-07-10 – 2022-07-16 (×7): 300 mg via ORAL
  Filled 2022-07-09 (×7): qty 1

## 2022-07-09 MED ORDER — LITHIUM CARBONATE ER 450 MG PO TBCR
900.0000 mg | EXTENDED_RELEASE_TABLET | Freq: Every day | ORAL | Status: DC
  Administered 2022-07-09 – 2022-07-29 (×21): 900 mg via ORAL
  Filled 2022-07-09 (×21): qty 2

## 2022-07-09 MED ORDER — DIVALPROEX SODIUM 500 MG PO DR TAB
1500.0000 mg | DELAYED_RELEASE_TABLET | Freq: Every day | ORAL | Status: DC
  Administered 2022-07-10 – 2022-07-14 (×5): 1500 mg via ORAL
  Filled 2022-07-09 (×5): qty 3

## 2022-07-09 MED ORDER — NICOTINE POLACRILEX 2 MG MT GUM
2.0000 mg | CHEWING_GUM | OROMUCOSAL | Status: DC | PRN
  Administered 2022-07-09 – 2022-07-30 (×8): 2 mg via ORAL
  Filled 2022-07-09 (×10): qty 1
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
## 2022-07-09 NOTE — [facility] Group Notes (Signed)
[facility] Group Notes:  (Nursing/MHT/Case Management/Adjunct)  Date:  07/09/2022  Time:  9:39 AM  Type of Therapy:   community meeting  Participation Level:  Did Not Attend    Antonieta Pert 07/09/2022, 9:39 AM

## 2022-07-09 NOTE — Group Note (Signed)
[facility] LCSW Group Therapy Note   Group Date: 07/09/2022 Start Time: 1430 End Time: 1530   Type of Therapy/Topic:  Group Therapy:  Emotion Regulation  Participation Level:  Did Not Attend    Description of Group:    The purpose of this group is to assist patients in learning to regulate negative emotions and experience positive emotions. Patients will be guided to discuss ways in which they have been vulnerable to their negative emotions. These vulnerabilities will be juxtaposed with experiences of positive emotions or situations, and patients challenged to use positive emotions to combat negative ones. Special emphasis will be placed on coping with negative emotions in conflict situations, and patients will process healthy conflict resolution skills.  Therapeutic Goals: Patient will identify two positive emotions or experiences to reflect on in order to balance out negative emotions:  Patient will label two or more emotions that they find the most difficult to experience:  Patient will be able to demonstrate positive conflict resolution skills through discussion or role plays:   Summary of Patient Progress: X   Therapeutic Modalities:   Cognitive Behavioral Therapy Feelings Identification Dialectical Behavioral Therapy   Shirl Harris, LCSW

## 2022-07-09 NOTE — Plan of Care (Signed)
  Problem: Education: Goal: Knowledge of General Education information will improve Description: Including pain rating scale, medication(s)/side effects and non-pharmacologic comfort measures Outcome: Progressing   Problem: Health Behavior/Discharge Planning: Goal: Ability to manage health-related needs will improve Outcome: Progressing   Problem: Clinical Measurements: Goal: Ability to maintain clinical measurements within normal limits will improve Outcome: Progressing Goal: Will remain free from infection Outcome: Progressing Goal: Diagnostic test results will improve Outcome: Progressing Goal: Respiratory complications will improve Outcome: Progressing Goal: Cardiovascular complication will be avoided Outcome: Progressing   Problem: Safety: Goal: Periods of time without injury will increase Outcome: Progressing   Problem: Physical Regulation: Goal: Ability to maintain clinical measurements within normal limits will improve Outcome: Progressing   Problem: Health Behavior/Discharge Planning: Goal: Identification of resources available to assist in meeting health care needs will improve Outcome: Progressing Goal: Compliance with treatment plan for underlying cause of condition will improve Outcome: Progressing   Problem: Coping: Goal: Ability to verbalize frustrations and anger appropriately will improve Outcome: Progressing Goal: Ability to demonstrate self-control will improve Outcome: Progressing   

## 2022-07-09 NOTE — [facility] Group Notes (Signed)
[facility] Group Notes:  (Nursing/MHT/Case Management/Adjunct)  Date:  07/09/2022  Time:  8:53 PM  Type of Therapy:   Wrap up  Participation Level:  Active  Participation Quality:  Appropriate  Affect:  Appropriate  Cognitive:  Alert  Insight:  Good  Engagement in Group:  Engaged  Modes of Intervention:  Support  Summary of Progress/Problems:  Autumn [name] 07/09/2022, 8:53 PM

## 2022-07-09 NOTE — Progress Notes (Signed)
Patient has been isolative to her room except for food and meds  Reports feeling better although her mood and affect remain depressed. She denies si/hi/avh depression anxiety and pain at this encounter.  Patient is requesting placement into a group home or other facility where she can be housed instead of being out in the streets.  Advised patient to speak to social work dept in the morning for assistance with placement.  For now patient is safe on the unit with q15 minute safety checks.  Will continue to monitor.     C Butler-Nicholson, LPN

## 2022-07-09 NOTE — Plan of Care (Signed)
D: Pt alert and oriented. Pt rates depression 0/10, hopelessness 0/10, and anxiety 0/10. Pt goal: "Figure out discharge plan." Pt reports energy level as low and concentration as being good. Pt reports sleep last night as being good. Pt did not receive medications for sleep. Pt denies experiencing any pain at this time. Pt denies experiencing any SI/HI, or AVH at this time.   Pt reports she really needs a place to stay to be safe. Pt is hopeful to find placement for safety.   A: Scheduled medications administered to pt, per MD orders. Support and encouragement provided. Frequent verbal contact made. Routine safety checks conducted q15 minutes.   R: No adverse drug reactions noted. Pt verbally contracts for safety at this time. Pt compliant with medications and treatment plan. Pt interacts well but minimally with others on the unit. Pt remains safe at this time. Plan of care ongoing.   Problem: Education: Goal: Knowledge of General Education information will improve Description: Including pain rating scale, medication(s)/side effects and non-pharmacologic comfort measures Outcome: Progressing   Problem: Nutrition: Goal: Adequate nutrition will be maintained Outcome: Progressing

## 2022-07-10 DIAGNOSIS — F3164 Bipolar disorder, current episode mixed, severe, with psychotic features: Secondary | ICD-10-CM | POA: Diagnosis not present

## 2022-07-10 MED ORDER — QUETIAPINE FUMARATE 200 MG PO TABS
300.0000 mg | ORAL_TABLET | Freq: Every day | ORAL | Status: DC
  Administered 2022-07-10 – 2022-07-29 (×20): 300 mg via ORAL
  Filled 2022-07-10 (×20): qty 1

## 2022-07-10 NOTE — Progress Notes (Signed)
[facility] MD Progress Note  07/10/2022 1:50 PM Autumn [name]  MRN:  [id number] Subjective: Autumn [name] seen on rounds.  She is complaining of being too tired in the morning and not being able to wake up.  She has been compliant with her medications and denies any other side effects.  Principal Problem: Bipolar affective disorder ([place]) Diagnosis: Principal Problem:   Bipolar affective disorder ([place])  Total Time spent with patient: 15 minutes  Past Psychiatric History: Past history of bipolar disorder or schizoaffective disorder with substance use problems  Past Medical History:  Past Medical History:  Diagnosis Date   Allergy    Seasonal, Ultram (seizures)   Anemia 2005   Bipolar 1 disorder ([place])    Glaucoma    Seizures ([place]) 2008    Past Surgical History:  Procedure Laterality Date   BREAST EXCISIONAL BIOPSY Right    x 2   CESAREAN SECTION     x 2   LEEP  2000   Family History:  Family History  Problem Relation Age of Onset   Hypertension Mother    Hyperlipidemia Mother    Asthma Mother    Parkinson's disease Father    Lung cancer Maternal Grandmother    Other Paternal Grandfather 6       Cardiac arrest   Heart disease Paternal Grandfather    Tourette syndrome Daughter     Social History:  Social History   Substance and Sexual Activity  Alcohol Use Not Currently   Alcohol/week: 8.0 standard drinks of alcohol   Types: 8 Shots of liquor per week   Comment: last use 03/30/22 4x/yr     Social History   Substance and Sexual Activity  Drug Use Yes   Frequency: 1.0 times per week   Types: Marijuana, Cocaine, Heroin, Methamphetamines, MDMA (Ecstacy)   Comment: last use yesterday    Social History   Socioeconomic History   Marital status: Legally Separated    Spouse name: Not on file   Number of children: Not on file   Years of education: Not on file   Highest education level: Not on file  Occupational History   Not on file  Tobacco Use   Smoking status: Every  Day    Packs/day: 0.50    Years: 9.00    Total pack years: 4.50    Types: E-cigarettes, Cigarettes, Cigars   Smokeless tobacco: Former    Types: Nurse, children's Use: Some days   Substances: Nicotine  Substance and Sexual Activity   Alcohol use: Not Currently    Alcohol/week: 8.0 standard drinks of alcohol    Types: 8 Shots of liquor per week    Comment: last use 03/30/22 4x/yr   Drug use: Yes    Frequency: 1.0 times per week    Types: Marijuana, Cocaine, Heroin, Methamphetamines, MDMA (Ecstacy)    Comment: last use yesterday   Sexual activity: Yes    Partners: Male    Birth control/protection: None  Other Topics Concern   Not on file  Social History Narrative   Not on file   Social Determinants of Health   Financial Resource Strain: Not on file  Food Insecurity: Not on file  Transportation Needs: Not on file  Physical Activity: Not on file  Stress: Not on file  Social Connections: Not on file   Additional Social History:  Sleep: Good  Appetite:  Good  Current Medications: Current Facility-Administered Medications  Medication Dose Route Frequency Provider Last Rate Last Admin   acetaminophen (TYLENOL) tablet 650 mg  650 mg Oral Q6H PRN Sherlon Handing, NP       alum & mag hydroxide-simeth (MAALOX/MYLANTA) 200-200-20 MG/5ML suspension 30 mL  30 mL Oral Q4H PRN Waldon Merl F, NP       ARIPiprazole (ABILIFY) tablet 15 mg  15 mg Oral Daily Clapacs, [name], MD   15 mg at 07/10/22 0855   divalproex (DEPAKOTE) DR tablet 1,500 mg  1,500 mg Oral QHS Clapacs, [name], MD       feeding supplement (ENSURE ENLIVE / ENSURE PLUS) liquid 237 mL  237 mL Oral BID BM Clapacs, [name], MD   237 mL at 07/09/22 1347   hydrOXYzine (ATARAX) tablet 25 mg  25 mg Oral TID PRN Sherlon Handing, NP   25 mg at 07/01/22 0902   lithium carbonate (ESKALITH) CR tablet 900 mg  900 mg Oral QHS Clapacs, [name], MD   900 mg at 07/09/22 2118   lithium  carbonate (LITHOBID) CR tablet 300 mg  300 mg Oral Daily Clapacs, [name], MD   300 mg at 07/10/22 0857   magnesium hydroxide (MILK OF MAGNESIA) suspension 30 mL  30 mL Oral Daily PRN Waldon Merl F, NP       nicotine polacrilex (NICORETTE) gum 2 mg  2 mg Oral PRN Clapacs, [name], MD   2 mg at 07/09/22 1551   QUEtiapine (SEROQUEL) tablet 400 mg  400 mg Oral QHS Clapacs, [name], MD   400 mg at 07/09/22 2118   senna-docusate (Senokot-S) tablet 2 tablet  2 tablet Oral Daily PRN Clapacs, [name], MD        Lab Results: No results found for this or any previous visit (from the past 48 hour(s)).  Blood Alcohol level:  Lab Results  Component Value Date   ETH <10 06/29/2022   ETH <10 67/34/1937    Metabolic Disorder Labs: Lab Results  Component Value Date   HGBA1C 4.9 12/25/2021   MPG 93.93 12/25/2021   MPG 114 06/02/2021   No results found for: "PROLACTIN" Lab Results  Component Value Date   CHOL 160 12/25/2021   TRIG 95 12/25/2021   HDL 58 12/25/2021   CHOLHDL 2.8 12/25/2021   VLDL 19 12/25/2021   LDLCALC 83 12/25/2021   LDLCALC 63 06/01/2021    Physical Findings: AIMS: Facial and Oral Movements Muscles of Facial Expression: None, normal Lips and Perioral Area: None, normal Jaw: None, normal Tongue: None, normal,Extremity Movements Upper (arms, wrists, hands, fingers): None, normal Lower (legs, knees, ankles, toes): None, normal, Trunk Movements Neck, shoulders, hips: None, normal, Overall Severity Severity of abnormal movements (highest score from questions above): None, normal Incapacitation due to abnormal movements: None, normal Patient's awareness of abnormal movements (rate only patient's report): No Awareness, Dental Status Current problems with teeth and/or dentures?: No Does patient usually wear dentures?: No  CIWA:    COWS:     Musculoskeletal: Strength & Muscle Tone: within normal limits Gait & Station: normal Patient leans: N/A  Psychiatric Specialty  Exam:  Presentation  General Appearance: Bizarre  Eye Contact:Fair  Speech:-- (non-linear)  Speech Volume:Normal  Handedness:Right   Mood and Affect  Mood:Dysphoric  Affect:Blunt   Thought Process  Thought Processes:Disorganized  Descriptions of Associations:Loose  Orientation:Partial  Thought Content:Illogical  History of Schizophrenia/Schizoaffective disorder:Yes  Duration of  Psychotic Symptoms:Greater than six months  Hallucinations:No data recorded Ideas of Reference:-- Pincus Badder)  Suicidal Thoughts:No data recorded Homicidal Thoughts:No data recorded  Sensorium  Memory:Immediate Poor  Judgment:Impaired  Insight:Lacking   Executive Functions  Concentration:Poor  Attention Span:Poor  Recall:Poor  Fund of Knowledge:Poor  Language:Poor   Psychomotor Activity  Psychomotor Activity:No data recorded  Assets  Assets:Resilience   Sleep  Sleep:No data recorded    Blood pressure (!) 88/62, pulse 60, temperature 97.7 F (36.5 C), temperature source Oral, resp. rate 18, height '5\' 1"'$  (1.549 m), weight 45 kg, last menstrual period 05/17/2022, SpO2 100 %. Body mass index is 18.74 kg/m.   Treatment Plan Summary: Daily contact with patient to assess and evaluate symptoms and progress in treatment, Medication management, and Plan decrease Seroquel to 300 mg at bedtime.  Corrigan, DO 07/10/2022, 1:50 PM

## 2022-07-10 NOTE — Plan of Care (Signed)
PT is calm and cooperative, depressed mood/affect although she denies depression, anxiety and hopelessness.  She denies SI or physical problems.  She is compliant with all medications.  Patient was monitored for safety via q 15 min checks.

## 2022-07-11 DIAGNOSIS — F3164 Bipolar disorder, current episode mixed, severe, with psychotic features: Secondary | ICD-10-CM | POA: Diagnosis not present

## 2022-07-11 NOTE — Group Note (Signed)
LCSW Group Therapy Note  Group Date: 07/11/2022 Start Time: 1300 End Time: 1400   Type of Therapy and Topic:  Group Therapy - How To Cope with Nervousness about Discharge   Participation Level:  Did Not Attend   Description of Group This process group involved identification of patients' feelings about discharge. Some of them are scheduled to be discharged soon, while others are new admissions, but each of them was asked to share thoughts and feelings surrounding discharge from the hospital. One common theme was that they are excited at the prospect of going home, while another was that many of them are apprehensive about sharing why they were hospitalized. Patients were given the opportunity to discuss these feelings with their peers in preparation for discharge.  Therapeutic Goals  Patient will identify their overall feelings about pending discharge. Patient will think about how they might proactively address issues that they believe will once again arise once they get home (i.e. with parents). Patients will participate in discussion about having hope for change.   Summary of Patient Progress:    X   Therapeutic Modalities Cognitive Behavioral Therapy   Shirl Harris, LCSW 07/11/2022  1:31 PM

## 2022-07-11 NOTE — [facility] Group Notes (Signed)
[facility] Group Notes:  (Nursing/MHT/Case Management/Adjunct)  Date:  07/11/2022  Time:  10:15 AM  Type of Therapy:   community meeting  Participation Level:  Did Not Attend    Antonieta Pert 07/11/2022, 10:15 AM

## 2022-07-11 NOTE — Plan of Care (Signed)
Pt is alert and oriented x 3, endorses good sleep without sleep medication, good appetite, but low energy level.  Depression hopelessness and anxiety all rated as "0" on self inventory.  Denies SI or physical problems.  Goal today is to go to groups.  Safety maintained; 15 minute observations continue.

## 2022-07-11 NOTE — Progress Notes (Signed)
Patient alert and oriented x 4 no distress noted, interacting appropriately with peers and staff, he is complaint with medication regimen, 15 minutes safety checks maintained will continue to monitor closely.

## 2022-07-11 NOTE — Progress Notes (Signed)
[facility] MD Progress Note  07/11/2022 4:25 PM Autumn [name]  MRN:  [id number] Subjective: Autumn [name] is seen on rounds.  She has been compliant with her medications and denies any side effects.  No issues and no complaints.  Principal Problem: Bipolar affective disorder ([place]) Diagnosis: Principal Problem:   Bipolar affective disorder ([place])  Total Time spent with patient: 15 minutes  Past Psychiatric History:  Past history of bipolar disorder or schizoaffective disorder with substance use problems  Past Medical History:  Past Medical History:  Diagnosis Date   Allergy    Seasonal, Ultram (seizures)   Anemia 2005   Bipolar 1 disorder ([place])    Glaucoma    Seizures ([place]) 2008    Past Surgical History:  Procedure Laterality Date   BREAST EXCISIONAL BIOPSY Right    x 2   CESAREAN SECTION     x 2   LEEP  2000   Family History:  Family History  Problem Relation Age of Onset   Hypertension Mother    Hyperlipidemia Mother    Asthma Mother    Parkinson's disease Father    Lung cancer Maternal Grandmother    Other Paternal Grandfather 20       Cardiac arrest   Heart disease Paternal Grandfather    Tourette syndrome Daughter     Social History:  Social History   Substance and Sexual Activity  Alcohol Use Not Currently   Alcohol/week: 8.0 standard drinks of alcohol   Types: 8 Shots of liquor per week   Comment: last use 03/30/22 4x/yr     Social History   Substance and Sexual Activity  Drug Use Yes   Frequency: 1.0 times per week   Types: Marijuana, Cocaine, Heroin, Methamphetamines, MDMA (Ecstacy)   Comment: last use yesterday    Social History   Socioeconomic History   Marital status: Legally Separated    Spouse name: Not on file   Number of children: Not on file   Years of education: Not on file   Highest education level: Not on file  Occupational History   Not on file  Tobacco Use   Smoking status: Every Day    Packs/day: 0.50    Years: 9.00    Total pack  years: 4.50    Types: E-cigarettes, Cigarettes, Cigars   Smokeless tobacco: Former    Types: Nurse, children's Use: Some days   Substances: Nicotine  Substance and Sexual Activity   Alcohol use: Not Currently    Alcohol/week: 8.0 standard drinks of alcohol    Types: 8 Shots of liquor per week    Comment: last use 03/30/22 4x/yr   Drug use: Yes    Frequency: 1.0 times per week    Types: Marijuana, Cocaine, Heroin, Methamphetamines, MDMA (Ecstacy)    Comment: last use yesterday   Sexual activity: Yes    Partners: Male    Birth control/protection: None  Other Topics Concern   Not on file  Social History Narrative   Not on file   Social Determinants of Health   Financial Resource Strain: Not on file  Food Insecurity: Not on file  Transportation Needs: Not on file  Physical Activity: Not on file  Stress: Not on file  Social Connections: Not on file   Additional Social History:                         Sleep: Good  Appetite:  Good  Current Medications:  Current Facility-Administered Medications  Medication Dose Route Frequency Provider Last Rate Last Admin   acetaminophen (TYLENOL) tablet 650 mg  650 mg Oral Q6H PRN Waldon Merl F, NP   650 mg at 07/10/22 2106   alum & mag hydroxide-simeth (MAALOX/MYLANTA) 200-200-20 MG/5ML suspension 30 mL  30 mL Oral Q4H PRN Waldon Merl F, NP       ARIPiprazole (ABILIFY) tablet 15 mg  15 mg Oral Daily Clapacs, [name], MD   15 mg at 07/11/22 0802   divalproex (DEPAKOTE) DR tablet 1,500 mg  1,500 mg Oral QHS Clapacs, [name], MD   1,500 mg at 07/10/22 2105   feeding supplement (ENSURE ENLIVE / ENSURE PLUS) liquid 237 mL  237 mL Oral BID BM Clapacs, [name], MD   237 mL at 07/11/22 1411   hydrOXYzine (ATARAX) tablet 25 mg  25 mg Oral TID PRN Sherlon Handing, NP   25 mg at 07/01/22 0902   lithium carbonate (ESKALITH) CR tablet 900 mg  900 mg Oral QHS Clapacs, [name], MD   900 mg at 07/10/22 2105   lithium carbonate  (LITHOBID) CR tablet 300 mg  300 mg Oral Daily Clapacs, [name], MD   300 mg at 07/11/22 0802   magnesium hydroxide (MILK OF MAGNESIA) suspension 30 mL  30 mL Oral Daily PRN Waldon Merl F, NP       nicotine polacrilex (NICORETTE) gum 2 mg  2 mg Oral PRN Clapacs, [name], MD   2 mg at 07/09/22 1551   QUEtiapine (SEROQUEL) tablet 300 mg  300 mg Oral QHS Parks Ranger, DO   300 mg at 07/10/22 2105   senna-docusate (Senokot-S) tablet 2 tablet  2 tablet Oral Daily PRN Clapacs, [name], MD        Lab Results: No results found for this or any previous visit (from the past 48 hour(s)).  Blood Alcohol level:  Lab Results  Component Value Date   ETH <10 06/29/2022   ETH <10 63/87/5643    Metabolic Disorder Labs: Lab Results  Component Value Date   HGBA1C 4.9 12/25/2021   MPG 93.93 12/25/2021   MPG 114 06/02/2021   No results found for: "PROLACTIN" Lab Results  Component Value Date   CHOL 160 12/25/2021   TRIG 95 12/25/2021   HDL 58 12/25/2021   CHOLHDL 2.8 12/25/2021   VLDL 19 12/25/2021   LDLCALC 83 12/25/2021   LDLCALC 63 06/01/2021    Physical Findings: AIMS: Facial and Oral Movements Muscles of Facial Expression: None, normal Lips and Perioral Area: None, normal Jaw: None, normal Tongue: None, normal,Extremity Movements Upper (arms, wrists, hands, fingers): None, normal Lower (legs, knees, ankles, toes): None, normal, Trunk Movements Neck, shoulders, hips: None, normal, Overall Severity Severity of abnormal movements (highest score from questions above): None, normal Incapacitation due to abnormal movements: None, normal Patient's awareness of abnormal movements (rate only patient's report): No Awareness, Dental Status Current problems with teeth and/or dentures?: No Does patient usually wear dentures?: No  CIWA:    COWS:     Musculoskeletal: Strength & Muscle Tone: within normal limits Gait & Station: normal Patient leans: N/A  Psychiatric Specialty  Exam:  Presentation  General Appearance: Bizarre  Eye Contact:Fair  Speech:-- (non-linear)  Speech Volume:Normal  Handedness:Right   Mood and Affect  Mood:Dysphoric  Affect:Blunt   Thought Process  Thought Processes:Disorganized  Descriptions of Associations:Loose  Orientation:Partial  Thought Content:Illogical  History of Schizophrenia/Schizoaffective disorder:Yes  Duration of Psychotic Symptoms:Greater than  six months  Hallucinations:No data recorded Ideas of Reference:-- Pincus Badder)  Suicidal Thoughts:No data recorded Homicidal Thoughts:No data recorded  Sensorium  Memory:Immediate Poor  Judgment:Impaired  Insight:Lacking   Executive Functions  Concentration:Poor  Attention Span:Poor  Recall:Poor  Fund of Knowledge:Poor  Language:Poor   Psychomotor Activity  Psychomotor Activity:No data recorded  Assets  Assets:Resilience   Sleep  Sleep:No data recorded    Blood pressure (!) 85/52, pulse 63, temperature 97.9 F (36.6 C), temperature source Oral, resp. rate 18, height '5\' 1"'$  (1.549 m), weight 45 kg, last menstrual period 05/17/2022, SpO2 100 %. Body mass index is 18.74 kg/m.   Treatment Plan Summary: Daily contact with patient to assess and evaluate symptoms and progress in treatment, Medication management, and Plan continue current medications.  [place] Ranger, DO 07/11/2022, 4:25 PM

## 2022-07-11 NOTE — BH IP Treatment Plan (Signed)
Interdisciplinary Treatment and Diagnostic Plan Update  07/11/2022 Time of Session: 8:30AM Autumn [name] MRN: [id number]  Principal Diagnosis: Bipolar affective disorder [facility])  Secondary Diagnoses: Principal Problem:   Bipolar affective disorder ([place])   Current Medications:  Current Facility-Administered Medications  Medication Dose Route Frequency Provider Last Rate Last Admin   acetaminophen (TYLENOL) tablet 650 mg  650 mg Oral Q6H PRN Waldon Merl F, NP   650 mg at 07/10/22 2106   alum & mag hydroxide-simeth (MAALOX/MYLANTA) 200-200-20 MG/5ML suspension 30 mL  30 mL Oral Q4H PRN Waldon Merl F, NP       ARIPiprazole (ABILIFY) tablet 15 mg  15 mg Oral Daily Clapacs, [name], MD   15 mg at 07/11/22 0802   divalproex (DEPAKOTE) DR tablet 1,500 mg  1,500 mg Oral QHS Clapacs, [name], MD   1,500 mg at 07/10/22 2105   feeding supplement (ENSURE ENLIVE / ENSURE PLUS) liquid 237 mL  237 mL Oral BID BM Clapacs, [name], MD   237 mL at 07/10/22 1446   hydrOXYzine (ATARAX) tablet 25 mg  25 mg Oral TID PRN Sherlon Handing, NP   25 mg at 07/01/22 0902   lithium carbonate (ESKALITH) CR tablet 900 mg  900 mg Oral QHS Clapacs, [name], MD   900 mg at 07/10/22 2105   lithium carbonate (LITHOBID) CR tablet 300 mg  300 mg Oral Daily Clapacs, [name], MD   300 mg at 07/11/22 0802   magnesium hydroxide (MILK OF MAGNESIA) suspension 30 mL  30 mL Oral Daily PRN Waldon Merl F, NP       nicotine polacrilex (NICORETTE) gum 2 mg  2 mg Oral PRN Clapacs, [name], MD   2 mg at 07/09/22 1551   QUEtiapine (SEROQUEL) tablet 300 mg  300 mg Oral QHS Parks Ranger, DO   300 mg at 07/10/22 2105   senna-docusate (Senokot-S) tablet 2 tablet  2 tablet Oral Daily PRN Clapacs, [name], MD       PTA Medications: Medications Prior to Admission  Medication Sig Dispense Refill Last Dose   ARIPiprazole (ABILIFY) 15 MG tablet Take 1 tablet (15 mg total) by mouth daily. (Patient not taking: Reported on 05/29/2022)  30 tablet 0    divalproex (DEPAKOTE) 250 MG DR tablet Take 2 tablets (500 mg total) by mouth every 12 (twelve) hours. (Patient not taking: Reported on 05/29/2022) 120 tablet 0    lithium carbonate (LITHOBID) 300 MG CR tablet Take 2 tablets (600 mg total) by mouth every 12 (twelve) hours. (Patient not taking: Reported on 05/29/2022) 120 tablet 0    norethindrone (MICRONOR) 0.35 MG tablet Take 1 tablet (0.35 mg total) by mouth daily. (Patient not taking: Reported on 05/20/2022) 28 tablet 13    QUEtiapine (SEROQUEL) 200 MG tablet Take 1 tablet (200 mg total) by mouth at bedtime. (Patient not taking: Reported on 05/29/2022) 30 tablet 0     Patient Stressors:    Patient Strengths:    Treatment Modalities: Medication Management, Group therapy, Case management,  1 to 1 session with clinician, Psychoeducation, Recreational therapy.   Physician Treatment Plan for Primary Diagnosis: Bipolar affective disorder ([place]) Long Term Goal(s): Improvement in symptoms so as ready for discharge   Short Term Goals: Ability to identify and develop effective coping behaviors will improve Ability to maintain clinical measurements within normal limits will improve Compliance with prescribed medications will improve Ability to demonstrate self-control will improve  Medication Management: Evaluate patient's response, side effects, and  tolerance of medication regimen.  Therapeutic Interventions: 1 to 1 sessions, Unit Group sessions and Medication administration.  Evaluation of Outcomes: Progressing  Physician Treatment Plan for Secondary Diagnosis: Principal Problem:   Bipolar affective disorder ([place])  Long Term Goal(s): Improvement in symptoms so as ready for discharge   Short Term Goals: Ability to identify and develop effective coping behaviors will improve Ability to maintain clinical measurements within normal limits will improve Compliance with prescribed medications will improve Ability to demonstrate  self-control will improve     Medication Management: Evaluate patient's response, side effects, and tolerance of medication regimen.  Therapeutic Interventions: 1 to 1 sessions, Unit Group sessions and Medication administration.  Evaluation of Outcomes: Progressing   RN Treatment Plan for Primary Diagnosis: Bipolar affective disorder ([place]) Long Term Goal(s): Knowledge of disease and therapeutic regimen to maintain health will improve  Short Term Goals: Ability to remain free from injury will improve, Ability to verbalize frustration and anger appropriately will improve, Ability to demonstrate self-control, Ability to participate in decision making will improve, Ability to verbalize feelings will improve, Ability to disclose and discuss suicidal ideas, Ability to identify and develop effective coping behaviors will improve, and Compliance with prescribed medications will improve  Medication Management: RN will administer medications as ordered by provider, will assess and evaluate patient's response and provide education to patient for prescribed medication. RN will report any adverse and/or side effects to prescribing provider.  Therapeutic Interventions: 1 on 1 counseling sessions, Psychoeducation, Medication administration, Evaluate responses to treatment, Monitor vital signs and CBGs as ordered, Perform/monitor CIWA, COWS, AIMS and Fall Risk screenings as ordered, Perform wound care treatments as ordered.  Evaluation of Outcomes: Progressing   LCSW Treatment Plan for Primary Diagnosis: Bipolar affective disorder ([place]) Long Term Goal(s): Safe transition to appropriate next level of care at discharge, Engage patient in therapeutic group addressing interpersonal concerns.  Short Term Goals: Engage patient in aftercare planning with referrals and resources, Increase social support, Increase ability to appropriately verbalize feelings, Increase emotional regulation, Facilitate acceptance of  mental health diagnosis and concerns, and Increase skills for wellness and recovery  Therapeutic Interventions: Assess for all discharge needs, 1 to 1 time with Social worker, Explore available resources and support systems, Assess for adequacy in community support network, Educate family and significant other(s) on suicide prevention, Complete Psychosocial Assessment, Interpersonal group therapy.  Evaluation of Outcomes: Progressing   Progress in Treatment: Attending groups: No. Participating in groups: No. Taking medication as prescribed: Yes. Toleration medication: Yes. Family/Significant other contact made: No, will contact:  when given permission. Patient understands diagnosis: No. Discussing patient identified problems/goals with staff: No. Medical problems stabilized or resolved: Yes. Denies suicidal/homicidal ideation: No. Issues/concerns per patient self-inventory: No. Other: none.  New problem(s) identified: No, Describe:  none identified. 07/05/22 Update: No changes at this time. 07/11/22 Update: No changes at this time.   New Short Term/Long Term Goal(s): detox, elimination of symptoms of psychosis, medication management for mood stabilization; elimination of SI thoughts; development of comprehensive mental wellness/sobriety plan. 07/05/22 Update: No changes at this time. 07/11/22 Update: No changes at this time.   Patient Goals:  Pt declined to attend treatment team meeting. 07/05/22 Update: No changes at this time. 07/11/22 Update: No changes at this time.   Discharge Plan or Barriers: CSW will assist pt with development of an appropriate aftercare/discharge plan. 07/05/22 Update: No changes at this time. 07/11/22 Update: No changes at this time.   Reason for Continuation of Hospitalization: Medication stabilization  Other; describe psychosis   Estimated Length of Stay: 1-7 days 07/05/22 Update: No changes at this time. 07/11/22 Update: No changes at this time.  Last 3 [place] Suicide  Severity Risk Score: Flowsheet Row Admission (Current) from 06/29/2022 in [place] Most recent reading at 06/29/2022  9:15 PM ED from 06/29/2022 in [place] Most recent reading at 06/29/2022  8:08 AM ED from 06/08/2022 in [place] Most recent reading at 06/08/2022  8:57 PM  C-SSRS RISK CATEGORY No Risk No Risk No Risk       Last PHQ 2/9 Scores:    04/01/2022    1:26 PM  Depression screen PHQ 2/9  Decreased Interest 0  Down, Depressed, Hopeless 2  PHQ - 2 Score 2  Altered sleeping 3  Tired, decreased energy 3  Change in appetite 3  Feeling bad or failure about yourself  3  Trouble concentrating 3  Moving slowly or fidgety/restless 3  Suicidal thoughts 0  PHQ-9 Score 20  Difficult doing work/chores Somewhat difficult    Scribe for Treatment Team: Shirl Harris, LCSW 07/11/2022 8:55 AM

## 2022-07-12 DIAGNOSIS — F3164 Bipolar disorder, current episode mixed, severe, with psychotic features: Secondary | ICD-10-CM | POA: Diagnosis not present

## 2022-07-12 LAB — VALPROIC ACID LEVEL: Valproic Acid Lvl: 102 ug/mL — ABNORMAL HIGH (ref 50.0–100.0)

## 2022-07-12 LAB — LITHIUM LEVEL: Lithium Lvl: 0.93 mmol/L (ref 0.60–1.20)

## 2022-07-12 NOTE — Progress Notes (Signed)
Patient alert and oriented x 4 no distress noted, no bizarre behavior noted, interacting appropriately with peers and staff, he is complaint with medication regimen, 15 minutes safety checks maintained will continue to monitor closely.

## 2022-07-12 NOTE — Progress Notes (Signed)
Pt has been pleasant and cooperative on the unit. She is minimally engaged with peers and is isolative to her room except for meals. Compliant with am medications. Autumn [name] denies any SI or HI or A/V Hallucinations and states she is eating and sleeping well and denies any acute concerns. Pt is safe, will con't to monitor.

## 2022-07-12 NOTE — Group Note (Signed)
[facility] LCSW Group Therapy Note    Group Date: 07/12/2022 Start Time: 1330 End Time: 1345  Type of Therapy and Topic:  Group Therapy:  Overcoming Obstacles  Participation Level:  [facility] PARTICIPATION LEVEL: Did Not Attend  Mood:  Description of Group:   In this group patients will be encouraged to explore what they see as obstacles to their own wellness and recovery. They will be guided to discuss their thoughts, feelings, and behaviors related to these obstacles. The group will process together ways to cope with barriers, with attention given to specific choices patients can make. Each patient will be challenged to identify changes they are motivated to make in order to overcome their obstacles. This group will be process-oriented, with patients participating in exploration of their own experiences as well as giving and receiving support and challenge from other group members.  Therapeutic Goals: 1. Patient will identify personal and current obstacles as they relate to admission. 2. Patient will identify barriers that currently interfere with their wellness or overcoming obstacles.  3. Patient will identify feelings, thought process and behaviors related to these barriers. 4. Patient will identify two changes they are willing to make to overcome these obstacles:    Summary of Patient Progress   Patient was asleep when group offered.  CSW provided patient with a worksheet in lieu of group.    Therapeutic Modalities:   Cognitive Behavioral Therapy Solution Focused Therapy Motivational Interviewing Relapse Prevention Therapy   Rozann Lesches, LCSW

## 2022-07-12 NOTE — Progress Notes (Signed)
[facility] MD Progress Note  07/12/2022 11:54 AM Autumn [name]  MRN:  [id number] Subjective: Follow-up for this 46 year old woman with bipolar disorder.  Patient seen and chart reviewed.  Patient has no new complaints.  Says mood is feeling better.  Denies suicidal thoughts denies hallucinations.  Still fatigued much of the time. Principal Problem: Bipolar affective disorder ([place]) Diagnosis: Principal Problem:   Bipolar affective disorder ([place])  Total Time spent with patient: 30 minutes  Past Psychiatric History: Past history of bipolar disorder  Past Medical History:  Past Medical History:  Diagnosis Date   Allergy    Seasonal, Ultram (seizures)   Anemia 2005   Bipolar 1 disorder ([place])    Glaucoma    Seizures ([place]) 2008    Past Surgical History:  Procedure Laterality Date   BREAST EXCISIONAL BIOPSY Right    x 2   CESAREAN SECTION     x 2   LEEP  2000   Family History:  Family History  Problem Relation Age of Onset   Hypertension Mother    Hyperlipidemia Mother    Asthma Mother    Parkinson's disease Father    Lung cancer Maternal Grandmother    Other Paternal Grandfather 55       Cardiac arrest   Heart disease Paternal Grandfather    Tourette syndrome Daughter    Family Psychiatric  History: See previous Social History:  Social History   Substance and Sexual Activity  Alcohol Use Not Currently   Alcohol/week: 8.0 standard drinks of alcohol   Types: 8 Shots of liquor per week   Comment: last use 03/30/22 4x/yr     Social History   Substance and Sexual Activity  Drug Use Yes   Frequency: 1.0 times per week   Types: Marijuana, Cocaine, Heroin, Methamphetamines, MDMA (Ecstacy)   Comment: last use yesterday    Social History   Socioeconomic History   Marital status: Legally Separated    Spouse name: Not on file   Number of children: Not on file   Years of education: Not on file   Highest education level: Not on file  Occupational History   Not on file   Tobacco Use   Smoking status: Every Day    Packs/day: 0.50    Years: 9.00    Total pack years: 4.50    Types: E-cigarettes, Cigarettes, Cigars   Smokeless tobacco: Former    Types: Nurse, children's Use: Some days   Substances: Nicotine  Substance and Sexual Activity   Alcohol use: Not Currently    Alcohol/week: 8.0 standard drinks of alcohol    Types: 8 Shots of liquor per week    Comment: last use 03/30/22 4x/yr   Drug use: Yes    Frequency: 1.0 times per week    Types: Marijuana, Cocaine, Heroin, Methamphetamines, MDMA (Ecstacy)    Comment: last use yesterday   Sexual activity: Yes    Partners: Male    Birth control/protection: None  Other Topics Concern   Not on file  Social History Narrative   Not on file   Social Determinants of Health   Financial Resource Strain: Not on file  Food Insecurity: Not on file  Transportation Needs: Not on file  Physical Activity: Not on file  Stress: Not on file  Social Connections: Not on file   Additional Social History:  Sleep: Fair  Appetite:  Fair  Current Medications: Current Facility-Administered Medications  Medication Dose Route Frequency Provider Last Rate Last Admin   acetaminophen (TYLENOL) tablet 650 mg  650 mg Oral Q6H PRN Sherlon Handing, NP   650 mg at 07/11/22 2107   alum & mag hydroxide-simeth (MAALOX/MYLANTA) 200-200-20 MG/5ML suspension 30 mL  30 mL Oral Q4H PRN Sherlon Handing, NP       ARIPiprazole (ABILIFY) tablet 15 mg  15 mg Oral Daily Marnae Madani, [name], MD   15 mg at 07/12/22 5397   divalproex (DEPAKOTE) DR tablet 1,500 mg  1,500 mg Oral QHS Huriel Matt T, MD   1,500 mg at 07/11/22 2107   feeding supplement (ENSURE ENLIVE / ENSURE PLUS) liquid 237 mL  237 mL Oral BID BM Erasmo Vertz T, MD   237 mL at 07/12/22 0944   hydrOXYzine (ATARAX) tablet 25 mg  25 mg Oral TID PRN Sherlon Handing, NP   25 mg at 07/01/22 0902   lithium carbonate (ESKALITH) CR  tablet 900 mg  900 mg Oral QHS Asucena Galer T, MD   900 mg at 07/11/22 2107   lithium carbonate (LITHOBID) CR tablet 300 mg  300 mg Oral Daily Clariza Sickman, [name], MD   300 mg at 07/12/22 6734   magnesium hydroxide (MILK OF MAGNESIA) suspension 30 mL  30 mL Oral Daily PRN Sherlon Handing, NP       nicotine polacrilex (NICORETTE) gum 2 mg  2 mg Oral PRN Tanisa Lagace, [name], MD   2 mg at 07/09/22 1551   QUEtiapine (SEROQUEL) tablet 300 mg  300 mg Oral QHS Parks Ranger, DO   300 mg at 07/11/22 2108   senna-docusate (Senokot-S) tablet 2 tablet  2 tablet Oral Daily PRN Bonnee Zertuche, [name], MD        Lab Results:  Results for orders placed or performed during the hospital encounter of 06/29/22 (from the past 48 hour(s))  Lithium level     Status: None   Collection Time: 07/12/22  6:41 AM  Result Value Ref Range   Lithium Lvl 0.93 0.60 - 1.20 mmol/L    Comment: Performed at [facility], [address] [place], [place] [address]  Valproic acid level     Status: Abnormal   Collection Time: 07/12/22  6:41 AM  Result Value Ref Range   Valproic Acid Lvl 102 (H) 50.0 - 100.0 ug/mL    Comment: Performed at [facility], [address] [place], [place] [address]    Blood Alcohol level:  Lab Results  Component Value Date   [facility] <10 06/29/2022   ETH <10 73/53/2992    Metabolic Disorder Labs: Lab Results  Component Value Date   HGBA1C 4.9 12/25/2021   MPG 93.93 12/25/2021   MPG 114 06/02/2021   No results found for: "PROLACTIN" Lab Results  Component Value Date   CHOL 160 12/25/2021   TRIG 95 12/25/2021   HDL 58 12/25/2021   CHOLHDL 2.8 12/25/2021   VLDL 19 12/25/2021   LDLCALC 83 12/25/2021   LDLCALC 63 06/01/2021    Physical Findings: AIMS: Facial and Oral Movements Muscles of Facial Expression: None, normal Lips and Perioral Area: None, normal Jaw: None, normal Tongue: None, normal,Extremity Movements Upper (arms, wrists, hands, fingers): None,  normal Lower (legs, knees, ankles, toes): None, normal, Trunk Movements Neck, shoulders, hips: None, normal, Overall Severity Severity of abnormal movements (highest score from questions above): None, normal Incapacitation due to  abnormal movements: None, normal Patient's awareness of abnormal movements (rate only patient's report): No Awareness, Dental Status Current problems with teeth and/or dentures?: No Does patient usually wear dentures?: No  CIWA:    COWS:     Musculoskeletal: Strength & Muscle Tone: within normal limits Gait & Station: normal Patient leans: N/A  Psychiatric Specialty Exam:  Presentation  General Appearance: Bizarre  Eye Contact:Fair  Speech:-- (non-linear)  Speech Volume:Normal  Handedness:Right   Mood and Affect  Mood:Dysphoric  Affect:Blunt   Thought Process  Thought Processes:Disorganized  Descriptions of Associations:Loose  Orientation:Partial  Thought Content:Illogical  History of Schizophrenia/Schizoaffective disorder:Yes  Duration of Psychotic Symptoms:Greater than six months  Hallucinations:No data recorded Ideas of Reference:-- Pincus Badder)  Suicidal Thoughts:No data recorded Homicidal Thoughts:No data recorded  Sensorium  Memory:Immediate Poor  Judgment:Impaired  Insight:Lacking   Executive Functions  Concentration:Poor  Attention Span:Poor  Recall:Poor  Fund of Knowledge:Poor  Language:Poor   Psychomotor Activity  Psychomotor Activity:No data recorded  Assets  Assets:Resilience   Sleep  Sleep:No data recorded   Physical Exam: Physical Exam Vitals and nursing note reviewed.  Constitutional:      Appearance: Normal appearance.  HENT:     Head: Normocephalic and atraumatic.     Mouth/Throat:     Pharynx: Oropharynx is clear.  Eyes:     Pupils: Pupils are equal, round, and reactive to light.  Cardiovascular:     Rate and Rhythm: Normal rate and regular rhythm.  Pulmonary:     Effort: Pulmonary  effort is normal.     Breath sounds: Normal breath sounds.  Abdominal:     General: Abdomen is flat.     Palpations: Abdomen is soft.  Musculoskeletal:        General: Normal range of motion.  Skin:    General: Skin is warm and dry.  Neurological:     General: No focal deficit present.     Mental Status: She is alert. Mental status is at baseline.  Psychiatric:        Mood and Affect: Mood normal.        Thought Content: Thought content normal.    Review of Systems  Constitutional: Negative.   HENT: Negative.    Eyes: Negative.   Respiratory: Negative.    Cardiovascular: Negative.   Gastrointestinal: Negative.   Musculoskeletal: Negative.   Skin: Negative.   Neurological: Negative.   Psychiatric/Behavioral: Negative.     Blood pressure 99/64, pulse 78, temperature 98.4 F (36.9 C), temperature source Oral, resp. rate 20, height '5\' 1"'$  (1.549 m), weight 45 kg, last menstrual period 05/17/2022, SpO2 99 %. Body mass index is 18.74 kg/m.   Treatment Plan Summary: Medication management and Plan no change to medication management.  Alterations made on Friday helping a little bit with the sedation.  Encourage patient to be out of bed making sure that when possible she follows up on discharge planning.  Alethia Berthold, MD 07/12/2022, 11:54 AM

## 2022-07-12 NOTE — BHH Group Notes (Signed)
[place] Group Notes:  (Nursing/MHT/Case Management/Adjunct)  Date:  07/12/2022  Time:  9:49 PM  Type of Therapy:  Group Therapy  Participation Level:  None  Participation Quality:  Appropriate  Affect:  Flat  Cognitive:  Alert  Insight:  Appropriate  Engagement in Group:  None  Modes of Intervention:  Education  Summary of Progress/Problems:  [name],Autumn Hasting E 07/12/2022, 9:49 PM

## 2022-07-13 DIAGNOSIS — F3164 Bipolar disorder, current episode mixed, severe, with psychotic features: Secondary | ICD-10-CM | POA: Diagnosis not present

## 2022-07-13 NOTE — B[facility] Group Notes (Signed)
[place] [facility] Group Notes:  (Nursing/MHT/Case Management/Adjunct)  Date:  07/13/2022  Time:  8:58 PM  Type of Therapy:  Group Therapy Take away   Participation Level:  Minimal  Participation Quality:  Appropriate  Affect:  Flat  Cognitive:  Alert  Insight:  Appropriate  Engagement in Group:  None  Modes of Intervention:  Discussion  Summary of Progress/Problems: Pt was late to group   [place] E 07/13/2022, 8:58 PM

## 2022-07-13 NOTE — Plan of Care (Signed)
Patient during assessments this morning endorsed doing "better" in her mood today then yesterday, with a neutral to relaxed affect, brief eye contact, and a reserved edge. Pt. Denied si/hi/avh and endorsed an ability to continue to remain safe on the unit. Pt. Orientation appeared grossly intact. Pt. Denied physical pain and endorsed toleration of medications thus far. Pt. Endorsed no problems with sleeping and or eating at this time. Pt. Had no complaints for this Probation officer.   Patient has been complaint with medications and unit procedures thus far. Pt. Has been observed eating good thus far. Pt. Has been able to remain safe on the unit thus far.   Q x 15 minute observation checks in place/maintained for safety. Patient is provided with education throughout shift when appropriate and able.  Patient is given/offered medications per orders. Patient is encouraged to attend groups, participate in unit activities and continue with plan of care. Pt. Chart and plans of care reviewed. Pt. Given support and encouragement when appropriate and able.      Problem: Education: Goal: Knowledge of General Education information will improve Description: Including pain rating scale, medication(s)/side effects and non-pharmacologic comfort measures Outcome: Progressing Note: Patient verbalized understanding of information provided.    Problem: Safety: Goal: Ability to remain free from injury will improve Outcome: Progressing Note: Patient has denied si/hi/avh.

## 2022-07-13 NOTE — Group Note (Signed)
[facility] LCSW Group Therapy Note    Group Date: 07/13/2022 Start Time: 1330 End Time: 1430  Type of Therapy and Topic:  Group Therapy:  Overcoming Obstacles  Participation Level:  B[facility] PARTICIPATION LEVEL: Active   Description of Group:   In this group patients will be encouraged to explore what they see as obstacles to their own wellness and recovery. They will be guided to discuss their thoughts, feelings, and behaviors related to these obstacles. The group will process together ways to cope with barriers, with attention given to specific choices patients can make. Each patient will be challenged to identify changes they are motivated to make in order to overcome their obstacles. This group will be process-oriented, with patients participating in exploration of their own experiences as well as giving and receiving support and challenge from other group members.  Therapeutic Goals: 1. Patient will identify personal and current obstacles as they relate to admission. 2. Patient will identify barriers that currently interfere with their wellness or overcoming obstacles.  3. Patient will identify feelings, thought process and behaviors related to these barriers. 4. Patient will identify two changes they are willing to make to overcome these obstacles:    Summary of Patient Progress Patient was present for the entirety of the group process. She was actively involved in the discussion. Pt stated that she is trying to overcome her alcohol and substance use. Friends who use were identified as potential barriers to overcoming this obstacle. She stated that moving forwards she needs to connect with better supports like church and AA/NA support groups. Pt went on to state that she also needs to reconnect with therapy as she has not done this in 15 years. She appeared open and receptive to feedback/comments from both peers and facilitator.   Therapeutic  Modalities:   Cognitive Behavioral Therapy Solution Focused Therapy Motivational Interviewing Relapse Prevention Therapy   Shirl Harris, LCSW
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
## 2022-07-13 NOTE — Progress Notes (Signed)
[facility] MD Progress Note  07/13/2022 2:26 PM Autumn [name]  MRN:  [id number] Subjective: Follow-up patient with bipolar disorder.  Still stays in bed a lot.  Easily awake [place].  Denies current hallucinations or suicidal thoughts.  Still seems dysphoric but not nearly as psychotic or disorganized as before.  Able to control her affect and behavior.  Has been making some mental preparation for discharge planning. Principal Problem: Bipolar affective disorder ([place]) Diagnosis: Principal Problem:   Bipolar affective disorder ([place])  Total Time spent with patient: 30 minutes  Past Psychiatric History: Past history of bipolar disorder complicated by substance use disorder  Past Medical History:  Past Medical History:  Diagnosis Date   Allergy    Seasonal, Ultram (seizures)   Anemia 2005   Bipolar 1 disorder ([place])    Glaucoma    Seizures ([place]) 2008    Past Surgical History:  Procedure Laterality Date   BREAST EXCISIONAL BIOPSY Right    x 2   CESAREAN SECTION     x 2   LEEP  2000   Family History:  Family History  Problem Relation Age of Onset   Hypertension Mother    Hyperlipidemia Mother    Asthma Mother    Parkinson's disease Father    Lung cancer Maternal Grandmother    Other Paternal Grandfather 51       Cardiac arrest   Heart disease Paternal Grandfather    Tourette syndrome Daughter    Family Psychiatric  History: See previous Social History:  Social History   Substance and Sexual Activity  Alcohol Use Not Currently   Alcohol/week: 8.0 standard drinks of alcohol   Types: 8 Shots of liquor per week   Comment: last use 03/30/22 4x/yr     Social History   Substance and Sexual Activity  Drug Use Yes   Frequency: 1.0 times per week   Types: Marijuana, Cocaine, Heroin, Methamphetamines, MDMA (Ecstacy)   Comment: last use yesterday    Social History   Socioeconomic History   Marital status: Legally Separated    Spouse name: Not on file   Number of children: Not  on file   Years of education: Not on file   Highest education level: Not on file  Occupational History   Not on file  Tobacco Use   Smoking status: Every Day    Packs/day: 0.50    Years: 9.00    Total pack years: 4.50    Types: E-cigarettes, Cigarettes, Cigars   Smokeless tobacco: Former    Types: Nurse, children's Use: Some days   Substances: Nicotine  Substance and Sexual Activity   Alcohol use: Not Currently    Alcohol/week: 8.0 standard drinks of alcohol    Types: 8 Shots of liquor per week    Comment: last use 03/30/22 4x/yr   Drug use: Yes    Frequency: 1.0 times per week    Types: Marijuana, Cocaine, Heroin, Methamphetamines, MDMA (Ecstacy)    Comment: last use yesterday   Sexual activity: Yes    Partners: Male    Birth control/protection: None  Other Topics Concern   Not on file  Social History Narrative   Not on file   Social Determinants of Health   Financial Resource Strain: Not on file  Food Insecurity: Not on file  Transportation Needs: Not on file  Physical Activity: Not on file  Stress: Not on file  Social Connections: Not on file   Additional Social History:  Sleep: Fair  Appetite:  Fair  Current Medications: Current Facility-Administered Medications  Medication Dose Route Frequency Provider Last Rate Last Admin   acetaminophen (TYLENOL) tablet 650 mg  650 mg Oral Q6H PRN Sherlon Handing, NP   650 mg at 07/11/22 2107   alum & mag hydroxide-simeth (MAALOX/MYLANTA) 200-200-20 MG/5ML suspension 30 mL  30 mL Oral Q4H PRN Sherlon Handing, NP       ARIPiprazole (ABILIFY) tablet 15 mg  15 mg Oral Daily Joel Cowin, [name], MD   15 mg at 07/13/22 0724   divalproex (DEPAKOTE) DR tablet 1,500 mg  1,500 mg Oral QHS Carles Florea T, MD   1,500 mg at 07/12/22 2108   feeding supplement (ENSURE ENLIVE / ENSURE PLUS) liquid 237 mL  237 mL Oral BID BM Hart Haas T, MD   237 mL at 07/13/22 1345   hydrOXYzine (ATARAX)  tablet 25 mg  25 mg Oral TID PRN Sherlon Handing, NP   25 mg at 07/01/22 0902   lithium carbonate (ESKALITH) CR tablet 900 mg  900 mg Oral QHS Rees Santistevan T, MD   900 mg at 07/12/22 2109   lithium carbonate (LITHOBID) CR tablet 300 mg  300 mg Oral Daily Jakhai Fant T, MD   300 mg at 07/13/22 0725   magnesium hydroxide (MILK OF MAGNESIA) suspension 30 mL  30 mL Oral Daily PRN Sherlon Handing, NP       nicotine polacrilex (NICORETTE) gum 2 mg  2 mg Oral PRN Eyob Godlewski, [name], MD   2 mg at 07/12/22 2109   QUEtiapine (SEROQUEL) tablet 300 mg  300 mg Oral QHS Parks Ranger, DO   300 mg at 07/12/22 2109   senna-docusate (Senokot-S) tablet 2 tablet  2 tablet Oral Daily PRN Estanislao Harmon, [name], MD        Lab Results:  Results for orders placed or performed during the hospital encounter of 06/29/22 (from the past 48 hour(s))  Lithium level     Status: None   Collection Time: 07/12/22  6:41 AM  Result Value Ref Range   Lithium Lvl 0.93 0.60 - 1.20 mmol/L    Comment: Performed at [facility], [address] [place], [place] [address]  Valproic acid level     Status: Abnormal   Collection Time: 07/12/22  6:41 AM  Result Value Ref Range   Valproic Acid Lvl 102 (H) 50.0 - 100.0 ug/mL    Comment: Performed at [facility], [address] [place], [place] [address]    Blood Alcohol level:  Lab Results  Component Value Date   [facility] <10 06/29/2022   ETH <10 76/16/0737    Metabolic Disorder Labs: Lab Results  Component Value Date   HGBA1C 4.9 12/25/2021   MPG 93.93 12/25/2021   MPG 114 06/02/2021   No results found for: "PROLACTIN" Lab Results  Component Value Date   CHOL 160 12/25/2021   TRIG 95 12/25/2021   HDL 58 12/25/2021   CHOLHDL 2.8 12/25/2021   VLDL 19 12/25/2021   LDLCALC 83 12/25/2021   LDLCALC 63 06/01/2021    Physical Findings: AIMS: Facial and Oral Movements Muscles of Facial Expression: None, normal Lips and Perioral Area: None,  normal Jaw: None, normal Tongue: None, normal,Extremity Movements Upper (arms, wrists, hands, fingers): None, normal Lower (legs, knees, ankles, toes): None, normal, Trunk Movements Neck, shoulders, hips: None, normal, Overall Severity Severity of abnormal movements (highest score from questions above): None, normal Incapacitation due to  abnormal movements: None, normal Patient's awareness of abnormal movements (rate only patient's report): No Awareness, Dental Status Current problems with teeth and/or dentures?: No Does patient usually wear dentures?: No  CIWA:    COWS:     Musculoskeletal: Strength & Muscle Tone: within normal limits Gait & Station: normal Patient leans: N/A  Psychiatric Specialty Exam:  Presentation  General Appearance: Bizarre  Eye Contact:Fair  Speech:-- (non-linear)  Speech Volume:Normal  Handedness:Right   Mood and Affect  Mood:Dysphoric  Affect:Blunt   Thought Process  Thought Processes:Disorganized  Descriptions of Associations:Loose  Orientation:Partial  Thought Content:Illogical  History of Schizophrenia/Schizoaffective disorder:Yes  Duration of Psychotic Symptoms:Greater than six months  Hallucinations:No data recorded Ideas of Reference:-- Pincus Badder)  Suicidal Thoughts:No data recorded Homicidal Thoughts:No data recorded  Sensorium  Memory:Immediate Poor  Judgment:Impaired  Insight:Lacking   Executive Functions  Concentration:Poor  Attention Span:Poor  Recall:Poor  Fund of Knowledge:Poor  Language:Poor   Psychomotor Activity  Psychomotor Activity:No data recorded  Assets  Assets:Resilience   Sleep  Sleep:No data recorded   Physical Exam: Physical Exam Vitals and nursing note reviewed.  Constitutional:      Appearance: Normal appearance.  HENT:     Head: Normocephalic and atraumatic.     Mouth/Throat:     Pharynx: Oropharynx is clear.  Eyes:     Pupils: Pupils are equal, round, and reactive to  light.  Cardiovascular:     Rate and Rhythm: Normal rate and regular rhythm.  Pulmonary:     Effort: Pulmonary effort is normal.     Breath sounds: Normal breath sounds.  Abdominal:     General: Abdomen is flat.     Palpations: Abdomen is soft.  Musculoskeletal:        General: Normal range of motion.  Skin:    General: Skin is warm and dry.  Neurological:     General: No focal deficit present.     Mental Status: She is alert. Mental status is at baseline.  Psychiatric:        Attention and Perception: Attention normal.        Mood and Affect: Mood normal. Affect is blunt.        Speech: Speech is delayed.        Behavior: Behavior is slowed.        Thought Content: Thought content normal.        Cognition and Memory: Memory is impaired.    Review of Systems  Constitutional: Negative.   HENT: Negative.    Eyes: Negative.   Respiratory: Negative.    Cardiovascular: Negative.   Gastrointestinal: Negative.   Musculoskeletal: Negative.   Skin: Negative.   Neurological: Negative.   Psychiatric/Behavioral: Negative.     Blood pressure (!) 86/57, pulse 63, temperature 98.4 F (36.9 C), temperature source Oral, resp. rate 16, height '5\' 1"'$  (1.549 m), weight 45 kg, last menstrual period 05/17/2022, SpO2 100 %. Body mass index is 18.74 kg/m.   Treatment Plan Summary: Medication management and Plan no change to current medicine management.  Once again encouraged her to attend groups to get herself cleaned up get out of bed otherwise it is hard for [place] to work on any kind of reasonable discharge planning.  Case reviewed with treatment team and social work.  Alethia Berthold, MD 07/13/2022, 2:26 PM

## 2022-07-13 NOTE — Progress Notes (Signed)
Recreation Therapy Notes  Date: 07/13/2022   Time: 10:00 am    Location: Craft room      Behavioral response: N/A   Intervention Topic: Time Management    Discussion/Intervention: Patient refused to attend group.    Clinical Observations/Feedback:  Patient refused to attend group.    Lasean Rahming LRT/CTRS        Mekayla Soman 07/13/2022 11:13 AM

## 2022-07-14 DIAGNOSIS — F3164 Bipolar disorder, current episode mixed, severe, with psychotic features: Secondary | ICD-10-CM | POA: Diagnosis not present

## 2022-07-14 NOTE — Progress Notes (Signed)
Patient is pleasant and cooperative. Had been isolative to her room prior to snack time to which she did come out for snack and actively participated in wrap up group.  She is med compliant and received her meds without issue. She denies si  hi avh She does endorse depression and anxiety.  Has worries regarding finding placement in either a women's shelter or even a female group home.  Support and encouragement given.  Will continue to monitor with q15 minute safety checks.       C Butler-Nicholson, LPN

## 2022-07-14 NOTE — Progress Notes (Signed)
[facility] MD Progress Note  07/14/2022 6:08 PM Autumn [name]  MRN:  [id number] Subjective: Patient seen for follow-up.  No new complaints.  Still spends a lot of time in bed during the day with mildly depressed mood but denies suicidal ideation or psychosis. Principal Problem: Bipolar affective disorder ([place]) Diagnosis: Principal Problem:   Bipolar affective disorder ([place])  Total Time spent with patient: 20 minutes  Past Psychiatric History: Past history of schizoaffective disorder and substance use  Past Medical History:  Past Medical History:  Diagnosis Date   Allergy    Seasonal, Ultram (seizures)   Anemia 2005   Bipolar 1 disorder ([place])    Glaucoma    Seizures ([place]) 2008    Past Surgical History:  Procedure Laterality Date   BREAST EXCISIONAL BIOPSY Right    x 2   CESAREAN SECTION     x 2   LEEP  2000   Family History:  Family History  Problem Relation Age of Onset   Hypertension Mother    Hyperlipidemia Mother    Asthma Mother    Parkinson's disease Father    Lung cancer Maternal Grandmother    Other Paternal Grandfather 73       Cardiac arrest   Heart disease Paternal Grandfather    Tourette syndrome Daughter    Family Psychiatric  History: See previous Social History:  Social History   Substance and Sexual Activity  Alcohol Use Not Currently   Alcohol/week: 8.0 standard drinks of alcohol   Types: 8 Shots of liquor per week   Comment: last use 03/30/22 4x/yr     Social History   Substance and Sexual Activity  Drug Use Yes   Frequency: 1.0 times per week   Types: Marijuana, Cocaine, Heroin, Methamphetamines, MDMA (Ecstacy)   Comment: last use yesterday    Social History   Socioeconomic History   Marital status: Legally Separated    Spouse name: Not on file   Number of children: Not on file   Years of education: Not on file   Highest education level: Not on file  Occupational History   Not on file  Tobacco Use   Smoking status: Every Day     Packs/day: 0.50    Years: 9.00    Total pack years: 4.50    Types: E-cigarettes, Cigarettes, Cigars   Smokeless tobacco: Former    Types: Nurse, children's Use: Some days   Substances: Nicotine  Substance and Sexual Activity   Alcohol use: Not Currently    Alcohol/week: 8.0 standard drinks of alcohol    Types: 8 Shots of liquor per week    Comment: last use 03/30/22 4x/yr   Drug use: Yes    Frequency: 1.0 times per week    Types: Marijuana, Cocaine, Heroin, Methamphetamines, MDMA (Ecstacy)    Comment: last use yesterday   Sexual activity: Yes    Partners: Male    Birth control/protection: None  Other Topics Concern   Not on file  Social History Narrative   Not on file   Social Determinants of Health   Financial Resource Strain: Not on file  Food Insecurity: Not on file  Transportation Needs: Not on file  Physical Activity: Not on file  Stress: Not on file  Social Connections: Not on file   Additional Social History:                         Sleep: Fair  Appetite:  Fair  Current Medications: Current Facility-Administered Medications  Medication Dose Route Frequency Provider Last Rate Last Admin   acetaminophen (TYLENOL) tablet 650 mg  650 mg Oral Q6H PRN Sherlon Handing, NP   650 mg at 07/11/22 2107   alum & mag hydroxide-simeth (MAALOX/MYLANTA) 200-200-20 MG/5ML suspension 30 mL  30 mL Oral Q4H PRN Waldon Merl F, NP       ARIPiprazole (ABILIFY) tablet 15 mg  15 mg Oral Daily Jasen Hartstein T, MD   15 mg at 07/14/22 0849   divalproex (DEPAKOTE) DR tablet 1,500 mg  1,500 mg Oral QHS Disney Ruggiero T, MD   1,500 mg at 07/13/22 2100   feeding supplement (ENSURE ENLIVE / ENSURE PLUS) liquid 237 mL  237 mL Oral BID BM Eudell Mcphee T, MD   237 mL at 07/14/22 1435   hydrOXYzine (ATARAX) tablet 25 mg  25 mg Oral TID PRN Sherlon Handing, NP   25 mg at 07/01/22 0902   lithium carbonate (ESKALITH) CR tablet 900 mg  900 mg Oral QHS Furqan Gosselin T, MD    900 mg at 07/13/22 2059   lithium carbonate (LITHOBID) CR tablet 300 mg  300 mg Oral Daily Lind Ausley T, MD   300 mg at 07/14/22 0849   magnesium hydroxide (MILK OF MAGNESIA) suspension 30 mL  30 mL Oral Daily PRN Waldon Merl F, NP       nicotine polacrilex (NICORETTE) gum 2 mg  2 mg Oral PRN Wilferd Ritson T, MD   2 mg at 07/13/22 1704   QUEtiapine (SEROQUEL) tablet 300 mg  300 mg Oral QHS Parks Ranger, DO   300 mg at 07/13/22 2059   senna-docusate (Senokot-S) tablet 2 tablet  2 tablet Oral Daily PRN Taytem Ghattas, [name], MD        Lab Results: No results found for this or any previous visit (from the past 48 hour(s)).  Blood Alcohol level:  Lab Results  Component Value Date   ETH <10 06/29/2022   ETH <10 40/06/6760    Metabolic Disorder Labs: Lab Results  Component Value Date   HGBA1C 4.9 12/25/2021   MPG 93.93 12/25/2021   MPG 114 06/02/2021   No results found for: "PROLACTIN" Lab Results  Component Value Date   CHOL 160 12/25/2021   TRIG 95 12/25/2021   HDL 58 12/25/2021   CHOLHDL 2.8 12/25/2021   VLDL 19 12/25/2021   LDLCALC 83 12/25/2021   LDLCALC 63 06/01/2021    Physical Findings: AIMS: Facial and Oral Movements Muscles of Facial Expression: None, normal Lips and Perioral Area: None, normal Jaw: None, normal Tongue: None, normal,Extremity Movements Upper (arms, wrists, hands, fingers): None, normal Lower (legs, knees, ankles, toes): None, normal, Trunk Movements Neck, shoulders, hips: None, normal, Overall Severity Severity of abnormal movements (highest score from questions above): None, normal Incapacitation due to abnormal movements: None, normal Patient's awareness of abnormal movements (rate only patient's report): No Awareness, Dental Status Current problems with teeth and/or dentures?: No Does patient usually wear dentures?: No  CIWA:    COWS:     Musculoskeletal: Strength & Muscle Tone: within normal limits Gait & Station:  normal Patient leans: N/A  Psychiatric Specialty Exam:  Presentation  General Appearance: Bizarre  Eye Contact:Fair  Speech:-- (non-linear)  Speech Volume:Normal  Handedness:Right   Mood and Affect  Mood:Dysphoric  Affect:Blunt   Thought Process  Thought Processes:Disorganized  Descriptions of Associations:Loose  Orientation:Partial  Thought Content:Illogical  History of Schizophrenia/Schizoaffective disorder:Yes  Duration  of Psychotic Symptoms:Greater than six months  Hallucinations:No data recorded Ideas of Reference:-- Pincus Badder)  Suicidal Thoughts:No data recorded Homicidal Thoughts:No data recorded  Sensorium  Memory:Immediate Poor  Judgment:Impaired  Insight:Lacking   Executive Functions  Concentration:Poor  Attention Span:Poor  Recall:Poor  Fund of Knowledge:Poor  Language:Poor   Psychomotor Activity  Psychomotor Activity:No data recorded  Assets  Assets:Resilience   Sleep  Sleep:No data recorded   Physical Exam: Physical Exam Nursing note reviewed.  Constitutional:      Appearance: Normal appearance.  HENT:     Head: Normocephalic and atraumatic.     Mouth/Throat:     Pharynx: Oropharynx is clear.  Eyes:     Pupils: Pupils are equal, round, and reactive to light.  Cardiovascular:     Rate and Rhythm: Normal rate and regular rhythm.  Pulmonary:     Effort: Pulmonary effort is normal.     Breath sounds: Normal breath sounds.  Abdominal:     General: Abdomen is flat.     Palpations: Abdomen is soft.  Musculoskeletal:        General: Normal range of motion.  Skin:    General: Skin is warm and dry.  Neurological:     General: No focal deficit present.     Mental Status: She is alert. Mental status is at baseline.  Psychiatric:        Attention and Perception: She is inattentive.        Mood and Affect: Mood normal. Affect is blunt.        Speech: Speech is delayed.        Thought Content: Thought content normal.     Review of Systems  Constitutional: Negative.   HENT: Negative.    Eyes: Negative.   Respiratory: Negative.    Cardiovascular: Negative.   Gastrointestinal: Negative.   Musculoskeletal: Negative.   Skin: Negative.   Neurological: Negative.   Psychiatric/Behavioral:  Positive for depression. Negative for hallucinations and suicidal ideas. The patient is not nervous/anxious.    Blood pressure 92/63, pulse 75, temperature 98.7 F (37.1 C), temperature source Oral, resp. rate 16, height '5\' 1"'$  (1.549 m), weight 45 kg, last menstrual period 05/17/2022, SpO2 99 %. Body mass index is 18.74 kg/m.   Treatment Plan Summary: Plan continue current medicine.  She made a call to the women's shelter and we are continuing to work on helping her find some kind of living situation after discharge  Alethia Berthold, MD 07/14/2022, 6:08 PM

## 2022-07-14 NOTE — Progress Notes (Signed)
Recreation Therapy Notes    Date: 07/14/2022   Time: 10:20 am    Location: Court yard     Behavioral response: N/A   Intervention Topic: Decision Making    Discussion/Intervention: Patient refused to attend group.    Clinical Observations/Feedback:  Patient refused to attend group.    Rasheida Broden LRT/CTRS        Tameia Rafferty 07/14/2022 10:42 AM

## 2022-07-14 NOTE — BHH Group Notes (Signed)
[facility] Group Notes:  (Nursing/MHT/Case Management/Adjunct)  Date:  07/14/2022  Time:  8:56 PM  Type of Therapy:   Wrap up  Participation Level:  Active  Participation Quality:  Appropriate  Affect:  Appropriate  Cognitive:  Alert  Insight:  Good  Engagement in Group:  Engaged  Modes of Intervention:   Want pt D/C  Summary of Progress/Problems:  Autumn [name] 07/14/2022, 8:56 PM

## 2022-07-14 NOTE — Plan of Care (Signed)
?  Problem: Coping: ?Goal: Level of anxiety will decrease ?Outcome: Progressing ?  ?Problem: Safety: ?Goal: Ability to remain free from injury will improve ?Outcome: Progressing ?  ?

## 2022-07-14 NOTE — Plan of Care (Signed)
  Problem: Education: Goal: Knowledge of General Education information will improve Description: Including pain rating scale, medication(s)/side effects and non-pharmacologic comfort measures Outcome: Progressing   Problem: Health Behavior/Discharge Planning: Goal: Ability to manage health-related needs will improve Outcome: Progressing   Problem: Clinical Measurements: Goal: Ability to maintain clinical measurements within normal limits will improve Outcome: Progressing Goal: Will remain free from infection Outcome: Progressing Goal: Diagnostic test results will improve Outcome: Progressing Goal: Respiratory complications will improve Outcome: Progressing Goal: Cardiovascular complication will be avoided Outcome: Progressing   Problem: Activity: Goal: Risk for activity intolerance will decrease Outcome: Progressing   Problem: Nutrition: Goal: Adequate nutrition will be maintained Outcome: Progressing   Problem: Coping: Goal: Level of anxiety will decrease Outcome: Progressing   Problem: Elimination: Goal: Will not experience complications related to bowel motility Outcome: Progressing Goal: Will not experience complications related to urinary retention Outcome: Progressing   Problem: Pain Managment: Goal: General experience of comfort will improve Outcome: Progressing   Problem: Safety: Goal: Ability to remain free from injury will improve Outcome: Progressing   Problem: Skin Integrity: Goal: Risk for impaired skin integrity will decrease Outcome: Progressing   Problem: Education: Goal: Knowledge of [place] General Education information/materials will improve Outcome: Progressing Goal: Emotional status will improve Outcome: Progressing Goal: Mental status will improve Outcome: Progressing Goal: Verbalization of understanding the information provided will improve Outcome: Progressing   Problem: Activity: Goal: Interest or engagement in activities will  improve Outcome: Progressing Goal: Sleeping patterns will improve Outcome: Progressing   Problem: Coping: Goal: Ability to verbalize frustrations and anger appropriately will improve Outcome: Progressing Goal: Ability to demonstrate self-control will improve Outcome: Progressing   Problem: Health Behavior/Discharge Planning: Goal: Identification of resources available to assist in meeting health care needs will improve Outcome: Progressing Goal: Compliance with treatment plan for underlying cause of condition will improve Outcome: Progressing   Problem: Physical Regulation: Goal: Ability to maintain clinical measurements within normal limits will improve Outcome: Progressing   Problem: Safety: Goal: Periods of time without injury will increase Outcome: Progressing

## 2022-07-14 NOTE — Progress Notes (Signed)
Patient pleasant and cooperative. Came out for snack this shift. Medication compliant. Appropriate with staff and peers. Reports feeling much better.Encouragement and support provided, safety checks maintained. Meds given as prescribed. Pt receptive and remains safe on unit with q 15 min checks.

## 2022-07-14 NOTE — Plan of Care (Signed)
D- Patient alert and oriented. Patient presented in a pleasant mood on assessment stating that she slept good last night and had no complaints to voice to this Probation officer. Patient denied SI, HI, AVH, and pain at this time. Patient also denied any signs/symptoms of depression/anxiety, stating that overall, she is feeling "well". Patient's goal for today is to "go to groups/discharge", in which she will "get outta bed", in order to achieve her goal. Patient has been seen in the milieu, but she has majority stayed in bed today.   A- Scheduled medications administered to patient, per MD orders. Support and encouragement provided.  Routine safety checks conducted every 15 minutes.  Patient informed to notify staff with problems or concerns.  R- No adverse drug reactions noted. Patient contracts for safety at this time. Patient compliant with medications and treatment plan. Patient receptive, calm, and cooperative. Patient interacts well with others on the unit.  Patient remains safe at this time.  Problem: Education: Goal: Knowledge of General Education information will improve Description: Including pain rating scale, medication(s)/side effects and non-pharmacologic comfort measures Outcome: Progressing   Problem: Health Behavior/Discharge Planning: Goal: Ability to manage health-related needs will improve Outcome: Progressing   Problem: Clinical Measurements: Goal: Ability to maintain clinical measurements within normal limits will improve Outcome: Progressing Goal: Will remain free from infection Outcome: Progressing Goal: Diagnostic test results will improve Outcome: Progressing Goal: Respiratory complications will improve Outcome: Progressing Goal: Cardiovascular complication will be avoided Outcome: Progressing   Problem: Activity: Goal: Risk for activity intolerance will decrease Outcome: Progressing   Problem: Nutrition: Goal: Adequate nutrition will be maintained Outcome:  Progressing   Problem: Coping: Goal: Level of anxiety will decrease Outcome: Progressing   Problem: Elimination: Goal: Will not experience complications related to bowel motility Outcome: Progressing Goal: Will not experience complications related to urinary retention Outcome: Progressing   Problem: Pain Managment: Goal: General experience of comfort will improve Outcome: Progressing   Problem: Safety: Goal: Ability to remain free from injury will improve Outcome: Progressing   Problem: Skin Integrity: Goal: Risk for impaired skin integrity will decrease Outcome: Progressing   Problem: Education: Goal: Knowledge of [place] General Education information/materials will improve Outcome: Progressing Goal: Emotional status will improve Outcome: Progressing Goal: Mental status will improve Outcome: Progressing Goal: Verbalization of understanding the information provided will improve Outcome: Progressing   Problem: Activity: Goal: Interest or engagement in activities will improve Outcome: Progressing Goal: Sleeping patterns will improve Outcome: Progressing   Problem: Coping: Goal: Ability to verbalize frustrations and anger appropriately will improve Outcome: Progressing Goal: Ability to demonstrate self-control will improve Outcome: Progressing   Problem: Health Behavior/Discharge Planning: Goal: Identification of resources available to assist in meeting health care needs will improve Outcome: Progressing Goal: Compliance with treatment plan for underlying cause of condition will improve Outcome: Progressing   Problem: Physical Regulation: Goal: Ability to maintain clinical measurements within normal limits will improve Outcome: Progressing   Problem: Safety: Goal: Periods of time without injury will increase Outcome: Progressing

## 2022-07-14 NOTE — Group Note (Signed)
[facility] LCSW Group Therapy Note   Group Date: 07/14/2022 Start Time: 1300 End Time: 1400   Type of Therapy/Topic:  Group Therapy:  Emotion Regulation  Participation Level:  Did Not Attend    Description of Group:    The purpose of this group is to assist patients in learning to regulate negative emotions and experience positive emotions. Patients will be guided to discuss ways in which they have been vulnerable to their negative emotions. These vulnerabilities will be juxtaposed with experiences of positive emotions or situations, and patients challenged to use positive emotions to combat negative ones. Special emphasis will be placed on coping with negative emotions in conflict situations, and patients will process healthy conflict resolution skills.  Therapeutic Goals: Patient will identify two positive emotions or experiences to reflect on in order to balance out negative emotions:  Patient will label two or more emotions that they find the most difficult to experience:  Patient will be able to demonstrate positive conflict resolution skills through discussion or role plays:   Summary of Patient Progress: Group not held due to acuity.     Therapeutic Modalities:   Cognitive Behavioral Therapy Feelings Identification Dialectical Behavioral Therapy   Shirl Harris, LCSW

## 2022-07-15 DIAGNOSIS — F3164 Bipolar disorder, current episode mixed, severe, with psychotic features: Secondary | ICD-10-CM | POA: Diagnosis not present

## 2022-07-15 MED ORDER — DIVALPROEX SODIUM 500 MG PO DR TAB
1250.0000 mg | DELAYED_RELEASE_TABLET | Freq: Every day | ORAL | Status: DC
  Administered 2022-07-15 – 2022-07-29 (×15): 1250 mg via ORAL
  Filled 2022-07-15 (×15): qty 1

## 2022-07-15 NOTE — Group Note (Signed)
[facility] LCSW Group Therapy Note   Group Date: 07/15/2022 Start Time: 1300 End Time: 1400   Type of Therapy/Topic:  Group Therapy:  Balance in Life  Participation Level:  Minimal   Description of Group:    This group will address the concept of balance and how it feels and looks when one is unbalanced. Patients will be encouraged to process areas in their lives that are out of balance, and identify reasons for remaining unbalanced. Facilitators will guide patients utilizing problem- solving interventions to address and correct the stressor making their life unbalanced. Understanding and applying boundaries will be explored and addressed for obtaining  and maintaining a balanced life. Patients will be encouraged to explore ways to assertively make their unbalanced needs known to significant others in their lives, using other group members and facilitator for support and feedback.  Therapeutic Goals: Patient will identify two or more emotions or situations they have that consume much of in their lives. Patient will identify signs/triggers that life has become out of balance:  Patient will identify two ways to set boundaries in order to achieve balance in their lives:  Patient will demonstrate ability to communicate their needs through discussion and/or role plays  Summary of Patient Progress: Patient was present for the majority of group. She participated in the icebreaker but not much after that. However, her behavior was appropriate for the group discussion.    Therapeutic Modalities:   Cognitive Behavioral Therapy Solution-Focused Therapy Assertiveness Training   Shirl Harris, LCSW

## 2022-07-15 NOTE — Plan of Care (Signed)
  Problem: Education: Goal: Knowledge of General Education information will improve Description: Including pain rating scale, medication(s)/side effects and non-pharmacologic comfort measures Outcome: Progressing   Problem: Health Behavior/Discharge Planning: Goal: Ability to manage health-related needs will improve Outcome: Progressing   Problem: Clinical Measurements: Goal: Ability to maintain clinical measurements within normal limits will improve Outcome: Progressing Goal: Will remain free from infection Outcome: Progressing Goal: Diagnostic test results will improve Outcome: Progressing Goal: Respiratory complications will improve Outcome: Progressing Goal: Cardiovascular complication will be avoided Outcome: Progressing   Problem: Safety: Goal: Periods of time without injury will increase Outcome: Progressing   Problem: Physical Regulation: Goal: Ability to maintain clinical measurements within normal limits will improve Outcome: Progressing   Problem: Health Behavior/Discharge Planning: Goal: Identification of resources available to assist in meeting health care needs will improve Outcome: Progressing Goal: Compliance with treatment plan for underlying cause of condition will improve Outcome: Progressing   Problem: Coping: Goal: Ability to verbalize frustrations and anger appropriately will improve Outcome: Progressing Goal: Ability to demonstrate self-control will improve Outcome: Progressing   

## 2022-07-15 NOTE — Progress Notes (Signed)
Recreation Therapy Notes   Date: 07/15/2022  Time: 10:05 am   Location: Courtyard      Behavioral response: N/A   Intervention Topic: Leisure    Discussion/Intervention: Patient did not attend group.   Clinical Observations/Feedback:  Patient did not attend group.   Chantele Corado LRT/CTRS        Blayton Huttner 07/15/2022 12:31 PM

## 2022-07-15 NOTE — Progress Notes (Signed)
[facility] MD Progress Note  07/15/2022 12:52 PM Autumn [name]  MRN:  [id number] Subjective: Still stays in bed most of the time.  Not getting out and interacting much.  Reports that she feels like mood is better.  Denies suicidal thoughts.  Tried making contact with the women's shelter but has not gotten through. Principal Problem: Bipolar affective disorder ([place]) Diagnosis: Principal Problem:   Bipolar affective disorder ([place])  Total Time spent with patient: 30 minutes  Past Psychiatric History: Past history of bipolar disorder or substance use suicidality  Past Medical History:  Past Medical History:  Diagnosis Date   Allergy    Seasonal, Ultram (seizures)   Anemia 2005   Bipolar 1 disorder ([place])    Glaucoma    Seizures ([place]) 2008    Past Surgical History:  Procedure Laterality Date   BREAST EXCISIONAL BIOPSY Right    x 2   CESAREAN SECTION     x 2   LEEP  2000   Family History:  Family History  Problem Relation Age of Onset   Hypertension Mother    Hyperlipidemia Mother    Asthma Mother    Parkinson's disease Father    Lung cancer Maternal Grandmother    Other Paternal Grandfather 103       Cardiac arrest   Heart disease Paternal Grandfather    Tourette syndrome Daughter    Family Psychiatric  History: See previous Social History:  Social History   Substance and Sexual Activity  Alcohol Use Not Currently   Alcohol/week: 8.0 standard drinks of alcohol   Types: 8 Shots of liquor per week   Comment: last use 03/30/22 4x/yr     Social History   Substance and Sexual Activity  Drug Use Yes   Frequency: 1.0 times per week   Types: Marijuana, Cocaine, Heroin, Methamphetamines, MDMA (Ecstacy)   Comment: last use yesterday    Social History   Socioeconomic History   Marital status: Legally Separated    Spouse name: Not on file   Number of children: Not on file   Years of education: Not on file   Highest education level: Not on file  Occupational History   Not on  file  Tobacco Use   Smoking status: Every Day    Packs/day: 0.50    Years: 9.00    Total pack years: 4.50    Types: E-cigarettes, Cigarettes, Cigars   Smokeless tobacco: Former    Types: Nurse, children's Use: Some days   Substances: Nicotine  Substance and Sexual Activity   Alcohol use: Not Currently    Alcohol/week: 8.0 standard drinks of alcohol    Types: 8 Shots of liquor per week    Comment: last use 03/30/22 4x/yr   Drug use: Yes    Frequency: 1.0 times per week    Types: Marijuana, Cocaine, Heroin, Methamphetamines, MDMA (Ecstacy)    Comment: last use yesterday   Sexual activity: Yes    Partners: Male    Birth control/protection: None  Other Topics Concern   Not on file  Social History Narrative   Not on file   Social Determinants of Health   Financial Resource Strain: Not on file  Food Insecurity: Not on file  Transportation Needs: Not on file  Physical Activity: Not on file  Stress: Not on file  Social Connections: Not on file   Additional Social History:  Sleep: Fair  Appetite:  Fair  Current Medications: Current Facility-Administered Medications  Medication Dose Route Frequency Provider Last Rate Last Admin   acetaminophen (TYLENOL) tablet 650 mg  650 mg Oral Q6H PRN Sherlon Handing, NP   650 mg at 07/11/22 2107   alum & mag hydroxide-simeth (MAALOX/MYLANTA) 200-200-20 MG/5ML suspension 30 mL  30 mL Oral Q4H PRN Waldon Merl F, NP       ARIPiprazole (ABILIFY) tablet 15 mg  15 mg Oral Daily Divinity Kyler T, MD   15 mg at 07/15/22 0819   divalproex (DEPAKOTE) DR tablet 1,250 mg  1,250 mg Oral QHS Marchetta Navratil T, MD       feeding supplement (ENSURE ENLIVE / ENSURE PLUS) liquid 237 mL  237 mL Oral BID BM Dorr Perrot T, MD   237 mL at 07/14/22 1435   hydrOXYzine (ATARAX) tablet 25 mg  25 mg Oral TID PRN Sherlon Handing, NP   25 mg at 07/01/22 0902   lithium carbonate (ESKALITH) CR tablet 900 mg  900 mg  Oral QHS Marley Charlot T, MD   900 mg at 07/14/22 2112   lithium carbonate (LITHOBID) CR tablet 300 mg  300 mg Oral Daily Deion Forgue T, MD   300 mg at 07/15/22 0820   magnesium hydroxide (MILK OF MAGNESIA) suspension 30 mL  30 mL Oral Daily PRN Waldon Merl F, NP       nicotine polacrilex (NICORETTE) gum 2 mg  2 mg Oral PRN Sharri Loya T, MD   2 mg at 07/13/22 1704   QUEtiapine (SEROQUEL) tablet 300 mg  300 mg Oral QHS Parks Ranger, DO   300 mg at 07/14/22 2112   senna-docusate (Senokot-S) tablet 2 tablet  2 tablet Oral Daily PRN Jasen Hartstein, [name], MD        Lab Results: No results found for this or any previous visit (from the past 48 hour(s)).  Blood Alcohol level:  Lab Results  Component Value Date   ETH <10 06/29/2022   ETH <10 76/73/4193    Metabolic Disorder Labs: Lab Results  Component Value Date   HGBA1C 4.9 12/25/2021   MPG 93.93 12/25/2021   MPG 114 06/02/2021   No results found for: "PROLACTIN" Lab Results  Component Value Date   CHOL 160 12/25/2021   TRIG 95 12/25/2021   HDL 58 12/25/2021   CHOLHDL 2.8 12/25/2021   VLDL 19 12/25/2021   LDLCALC 83 12/25/2021   LDLCALC 63 06/01/2021    Physical Findings: AIMS: Facial and Oral Movements Muscles of Facial Expression: None, normal Lips and Perioral Area: None, normal Jaw: None, normal Tongue: None, normal,Extremity Movements Upper (arms, wrists, hands, fingers): None, normal Lower (legs, knees, ankles, toes): None, normal, Trunk Movements Neck, shoulders, hips: None, normal, Overall Severity Severity of abnormal movements (highest score from questions above): None, normal Incapacitation due to abnormal movements: None, normal Patient's awareness of abnormal movements (rate only patient's report): No Awareness, Dental Status Current problems with teeth and/or dentures?: No Does patient usually wear dentures?: No  CIWA:    COWS:     Musculoskeletal: Strength & Muscle Tone: within normal  limits Gait & Station: normal Patient leans: N/A  Psychiatric Specialty Exam:  Presentation  General Appearance: Bizarre  Eye Contact:Fair  Speech:-- (non-linear)  Speech Volume:Normal  Handedness:Right   Mood and Affect  Mood:Dysphoric  Affect:Blunt   Thought Process  Thought Processes:Disorganized  Descriptions of Associations:Loose  Orientation:Partial  Thought Content:Illogical  History of Schizophrenia/Schizoaffective disorder:Yes  Duration of Psychotic Symptoms:Greater than six months  Hallucinations:No data recorded Ideas of Reference:-- Pincus Badder)  Suicidal Thoughts:No data recorded Homicidal Thoughts:No data recorded  Sensorium  Memory:Immediate Poor  Judgment:Impaired  Insight:Lacking   Executive Functions  Concentration:Poor  Attention Span:Poor  Recall:Poor  Fund of Knowledge:Poor  Language:Poor   Psychomotor Activity  Psychomotor Activity:No data recorded  Assets  Assets:Resilience   Sleep  Sleep:No data recorded   Physical Exam: Physical Exam Vitals and nursing note reviewed.  Constitutional:      Appearance: Normal appearance.  HENT:     Head: Normocephalic and atraumatic.     Mouth/Throat:     Pharynx: Oropharynx is clear.  Eyes:     Pupils: Pupils are equal, round, and reactive to light.  Cardiovascular:     Rate and Rhythm: Normal rate and regular rhythm.  Pulmonary:     Effort: Pulmonary effort is normal.     Breath sounds: Normal breath sounds.  Abdominal:     General: Abdomen is flat.     Palpations: Abdomen is soft.  Musculoskeletal:        General: Normal range of motion.  Skin:    General: Skin is warm and dry.  Neurological:     General: No focal deficit present.     Mental Status: She is alert. Mental status is at baseline.  Psychiatric:        Attention and Perception: She is inattentive.        Mood and Affect: Mood normal. Affect is blunt.        Speech: Speech is delayed.        Behavior:  Behavior is withdrawn.        Thought Content: Thought content normal.    Review of Systems  Constitutional: Negative.   HENT: Negative.    Eyes: Negative.   Respiratory: Negative.    Cardiovascular: Negative.   Gastrointestinal: Negative.   Musculoskeletal: Negative.   Skin: Negative.   Neurological: Negative.   Psychiatric/Behavioral: Negative.     Blood pressure 90/62, pulse 64, temperature 97.9 F (36.6 C), temperature source Oral, resp. rate 18, height '5\' 1"'$  (1.549 m), weight 45 kg, last menstrual period 05/17/2022, SpO2 100 %. Body mass index is 18.74 kg/m.   Treatment Plan Summary: Plan challenged patient to get up out of bed trying to be more interactive.  Looking at her recent labs her valproic level was a little on the high side and I do not want her over sedated so I will cut it back to 1250 mg at night.  No other change to medicine  Alethia Berthold, MD 07/15/2022, 12:52 PM

## 2022-07-15 NOTE — [facility] Counselor (Signed)
CSW met with pt briefly, per request. Pt states she has nowhere to go. CSW informed pt that she would be given a Industrial/product designer. She agreed. No other concerns expressed. Contact ended without incident.   Pt received shelter resource list.   Chalmers Guest. Guerry Bruin, MSW, [place], [place] 07/15/2022 4:33 PM

## 2022-07-15 NOTE — Plan of Care (Signed)
D- Patient alert and oriented. Patient presented in a pleasant mood on assessment stating that she slept "good" last night and had no complaints to voice to this Probation officer. Patient denied SI, HI, AVH, and pain at this time. Patient also denied any signs/symptoms of depression/anxiety, stating that overall, she is "ok". Patient's goal for today is "discharge", in which she will "get up-talk about discharge", in order to achieve her goal. Patient has only been up for meals and medication.  A- Scheduled medications administered to patient, per MD orders. Support and encouragement provided.  Routine safety checks conducted every 15 minutes.  Patient informed to notify staff with problems or concerns.  R- No adverse drug reactions noted. Patient contracts for safety at this time. Patient compliant with medications and treatment plan. Patient receptive, calm, and cooperative. Patient interacts well with others on the unit. Patient remains safe at this time.  Problem: Education: Goal: Knowledge of General Education information will improve Description: Including pain rating scale, medication(s)/side effects and non-pharmacologic comfort measures Outcome: Progressing   Problem: Health Behavior/Discharge Planning: Goal: Ability to manage health-related needs will improve Outcome: Progressing   Problem: Clinical Measurements: Goal: Ability to maintain clinical measurements within normal limits will improve Outcome: Progressing Goal: Will remain free from infection Outcome: Progressing Goal: Diagnostic test results will improve Outcome: Progressing Goal: Respiratory complications will improve Outcome: Progressing Goal: Cardiovascular complication will be avoided Outcome: Progressing   Problem: Activity: Goal: Risk for activity intolerance will decrease Outcome: Progressing   Problem: Nutrition: Goal: Adequate nutrition will be maintained Outcome: Progressing   Problem: Coping: Goal: Level of  anxiety will decrease Outcome: Progressing   Problem: Elimination: Goal: Will not experience complications related to bowel motility Outcome: Progressing Goal: Will not experience complications related to urinary retention Outcome: Progressing   Problem: Pain Managment: Goal: General experience of comfort will improve Outcome: Progressing   Problem: Safety: Goal: Ability to remain free from injury will improve Outcome: Progressing   Problem: Skin Integrity: Goal: Risk for impaired skin integrity will decrease Outcome: Progressing   Problem: Education: Goal: Knowledge of [place] General Education information/materials will improve Outcome: Progressing Goal: Emotional status will improve Outcome: Progressing Goal: Mental status will improve Outcome: Progressing Goal: Verbalization of understanding the information provided will improve Outcome: Progressing   Problem: Activity: Goal: Interest or engagement in activities will improve Outcome: Progressing Goal: Sleeping patterns will improve Outcome: Progressing   Problem: Coping: Goal: Ability to verbalize frustrations and anger appropriately will improve Outcome: Progressing Goal: Ability to demonstrate self-control will improve Outcome: Progressing   Problem: Health Behavior/Discharge Planning: Goal: Identification of resources available to assist in meeting health care needs will improve Outcome: Progressing Goal: Compliance with treatment plan for underlying cause of condition will improve Outcome: Progressing   Problem: Physical Regulation: Goal: Ability to maintain clinical measurements within normal limits will improve Outcome: Progressing   Problem: Safety: Goal: Periods of time without injury will increase Outcome: Progressing

## 2022-07-15 NOTE — BH IP Treatment Plan (Signed)
Interdisciplinary Treatment and Diagnostic Plan Update  07/15/2022 Time of Session: 08:30 Shenell Rogalski MRN: [id number]  Principal Diagnosis: Bipolar affective disorder [facility])  Secondary Diagnoses: Principal Problem:   Bipolar affective disorder ([place])   Current Medications:  Current Facility-Administered Medications  Medication Dose Route Frequency Provider Last Rate Last Admin   acetaminophen (TYLENOL) tablet 650 mg  650 mg Oral Q6H PRN Waldon Merl F, NP   650 mg at 07/11/22 2107   alum & mag hydroxide-simeth (MAALOX/MYLANTA) 200-200-20 MG/5ML suspension 30 mL  30 mL Oral Q4H PRN Waldon Merl F, NP       ARIPiprazole (ABILIFY) tablet 15 mg  15 mg Oral Daily Clapacs, [name], MD   15 mg at 07/15/22 0819   divalproex (DEPAKOTE) DR tablet 1,500 mg  1,500 mg Oral QHS Clapacs, [name], MD   1,500 mg at 07/14/22 2112   feeding supplement (ENSURE ENLIVE / ENSURE PLUS) liquid 237 mL  237 mL Oral BID BM Clapacs, [name], MD   237 mL at 07/14/22 1435   hydrOXYzine (ATARAX) tablet 25 mg  25 mg Oral TID PRN Sherlon Handing, NP   25 mg at 07/01/22 0902   lithium carbonate (ESKALITH) CR tablet 900 mg  900 mg Oral QHS Clapacs, [name], MD   900 mg at 07/14/22 2112   lithium carbonate (LITHOBID) CR tablet 300 mg  300 mg Oral Daily Clapacs, [name], MD   300 mg at 07/15/22 0820   magnesium hydroxide (MILK OF MAGNESIA) suspension 30 mL  30 mL Oral Daily PRN Waldon Merl F, NP       nicotine polacrilex (NICORETTE) gum 2 mg  2 mg Oral PRN Clapacs, [name], MD   2 mg at 07/13/22 1704   QUEtiapine (SEROQUEL) tablet 300 mg  300 mg Oral QHS Parks Ranger, DO   300 mg at 07/14/22 2112   senna-docusate (Senokot-S) tablet 2 tablet  2 tablet Oral Daily PRN Clapacs, [name], MD       PTA Medications: Medications Prior to Admission  Medication Sig Dispense Refill Last Dose   ARIPiprazole (ABILIFY) 15 MG tablet Take 1 tablet (15 mg total) by mouth daily. (Patient not taking: Reported on 05/29/2022)  30 tablet 0    divalproex (DEPAKOTE) 250 MG DR tablet Take 2 tablets (500 mg total) by mouth every 12 (twelve) hours. (Patient not taking: Reported on 05/29/2022) 120 tablet 0    lithium carbonate (LITHOBID) 300 MG CR tablet Take 2 tablets (600 mg total) by mouth every 12 (twelve) hours. (Patient not taking: Reported on 05/29/2022) 120 tablet 0    norethindrone (MICRONOR) 0.35 MG tablet Take 1 tablet (0.35 mg total) by mouth daily. (Patient not taking: Reported on 05/20/2022) 28 tablet 13    QUEtiapine (SEROQUEL) 200 MG tablet Take 1 tablet (200 mg total) by mouth at bedtime. (Patient not taking: Reported on 05/29/2022) 30 tablet 0     Patient Stressors:    Patient Strengths:    Treatment Modalities: Medication Management, Group therapy, Case management,  1 to 1 session with clinician, Psychoeducation, Recreational therapy.   Physician Treatment Plan for Primary Diagnosis: Bipolar affective disorder ([place]) Long Term Goal(s): Improvement in symptoms so as ready for discharge   Short Term Goals: Ability to identify and develop effective coping behaviors will improve Ability to maintain clinical measurements within normal limits will improve Compliance with prescribed medications will improve Ability to demonstrate self-control will improve  Medication Management: Evaluate patient's response, side effects, and  tolerance of medication regimen.  Therapeutic Interventions: 1 to 1 sessions, Unit Group sessions and Medication administration.  Evaluation of Outcomes: Progressing  Physician Treatment Plan for Secondary Diagnosis: Principal Problem:   Bipolar affective disorder ([place])  Long Term Goal(s): Improvement in symptoms so as ready for discharge   Short Term Goals: Ability to identify and develop effective coping behaviors will improve Ability to maintain clinical measurements within normal limits will improve Compliance with prescribed medications will improve Ability to demonstrate  self-control will improve     Medication Management: Evaluate patient's response, side effects, and tolerance of medication regimen.  Therapeutic Interventions: 1 to 1 sessions, Unit Group sessions and Medication administration.  Evaluation of Outcomes: Progressing   RN Treatment Plan for Primary Diagnosis: Bipolar affective disorder ([place]) Long Term Goal(s): Knowledge of disease and therapeutic regimen to maintain health will improve  Short Term Goals: Ability to remain free from injury will improve, Ability to verbalize frustration and anger appropriately will improve, Ability to demonstrate self-control, Ability to participate in decision making will improve, Ability to verbalize feelings will improve, Ability to disclose and discuss suicidal ideas, Ability to identify and develop effective coping behaviors will improve, and Compliance with prescribed medications will improve  Medication Management: RN will administer medications as ordered by provider, will assess and evaluate patient's response and provide education to patient for prescribed medication. RN will report any adverse and/or side effects to prescribing provider.  Therapeutic Interventions: 1 on 1 counseling sessions, Psychoeducation, Medication administration, Evaluate responses to treatment, Monitor vital signs and CBGs as ordered, Perform/monitor CIWA, COWS, AIMS and Fall Risk screenings as ordered, Perform wound care treatments as ordered.  Evaluation of Outcomes: Progressing   LCSW Treatment Plan for Primary Diagnosis: Bipolar affective disorder ([place]) Long Term Goal(s): Safe transition to appropriate next level of care at discharge, Engage patient in therapeutic group addressing interpersonal concerns.  Short Term Goals: Engage patient in aftercare planning with referrals and resources, Increase social support, Increase ability to appropriately verbalize feelings, Increase emotional regulation, Facilitate acceptance of  mental health diagnosis and concerns, and Increase skills for wellness and recovery  Therapeutic Interventions: Assess for all discharge needs, 1 to 1 time with Social worker, Explore available resources and support systems, Assess for adequacy in community support network, Educate family and significant other(s) on suicide prevention, Complete Psychosocial Assessment, Interpersonal group therapy.  Evaluation of Outcomes: Progressing   Progress in Treatment: Attending groups: No. Participating in groups: No. Taking medication as prescribed: Yes. Toleration medication: Yes. Family/Significant other contact made: No, will contact:  when given permission.  Patient understands diagnosis: No. Discussing patient identified problems/goals with staff: No. Medical problems stabilized or resolved: Yes. Denies suicidal/homicidal ideation: No. Issues/concerns per patient self-inventory: No. Other: none.  New problem(s) identified: No, Describe:  none identified. 07/05/22 Update: No changes at this time. 07/11/22 Update: No changes at this time. 07/15/22 Update: No changes at this time.   New Short Term/Long Term Goal(s): detox, elimination of symptoms of psychosis, medication management for mood stabilization; elimination of SI thoughts; development of comprehensive mental wellness/sobriety plan. 07/05/22 Update: No changes at this time. 07/11/22 Update: No changes at this time. 07/15/22 Update: No changes at this time.   Patient Goals:  Pt declined to attend treatment team meeting. 07/05/22 Update: No changes at this time. 07/11/22 Update: No changes at this time. 07/15/22 Update: No changes at this time.   Discharge Plan or Barriers: CSW will assist pt with development of an appropriate aftercare/discharge plan. 07/05/22  Update: No changes at this time. 07/11/22 Update: No changes at this time. 07/15/22 Update: No changes at this time.   Reason for Continuation of Hospitalization: Medication stabilization Other;  describe psychosis   Estimated Length of Stay: 1-7 days 07/05/22 Update: No changes at this time. 07/11/22 Update: No changes at this time. 07/15/22 Update: TBD  Last 3 [place] Suicide Severity Risk Score: Flowsheet Row Admission (Current) from 06/29/2022 in [facility] Most recent reading at 06/29/2022  9:15 PM ED from 06/29/2022 in [place] Most recent reading at 06/29/2022  8:08 AM ED from 06/08/2022 in [place] Most recent reading at 06/08/2022  8:57 PM  C-SSRS RISK CATEGORY No Risk No Risk No Risk       Last PHQ 2/9 Scores:    04/01/2022    1:26 PM  Depression screen PHQ 2/9  Decreased Interest 0  Down, Depressed, Hopeless 2  PHQ - 2 Score 2  Altered sleeping 3  Tired, decreased energy 3  Change in appetite 3  Feeling bad or failure about yourself  3  Trouble concentrating 3  Moving slowly or fidgety/restless 3  Suicidal thoughts 0  PHQ-9 Score 20  Difficult doing work/chores Somewhat difficult    Scribe for Treatment Team: Shirl Harris, LCSW 07/15/2022 9:44 AM

## 2022-07-16 DIAGNOSIS — F3164 Bipolar disorder, current episode mixed, severe, with psychotic features: Secondary | ICD-10-CM | POA: Diagnosis not present

## 2022-07-16 MED ORDER — ARIPIPRAZOLE 5 MG PO TABS
15.0000 mg | ORAL_TABLET | Freq: Every day | ORAL | Status: DC
  Administered 2022-07-17 – 2022-07-29 (×13): 15 mg via ORAL
  Filled 2022-07-16 (×13): qty 1

## 2022-07-16 NOTE — Progress Notes (Signed)
Patient presents with depressed mood, affect blunted. Autumn [name] states she is doing well, she denies any acute concerns stating she is hopeful to get to rehab. She states '' it will probably be a good thing if [place] of [place]. '' Patient states she is sleeping and eating well. She denies any acute concerns. Patient compliant with am medications. She denies any SI or HI or A/V Hallucinations. Patient completed self inventory and rates depression, hopelessness and anxiety at 0/10 on scale, 10 being worst 0 being none.  Patient isolative to room but states her plan for today is to make phone calls for rehab. Pt B/P noted to be low, pt asymptomatic. Discussed above with MD . Pt is safe, encouraged po fluids.  Will con't to monitor.

## 2022-07-16 NOTE — Progress Notes (Signed)
[facility] MD Progress Note  07/16/2022 10:37 AM Autumn [name]  MRN:  [id number] Subjective: No new complaint.  Mood gradually better.  No suicidal ideation.  Still stays in her room most of the time and appears to be very tired.  Blood pressure also running even lower than usual although she is not having much in the way of symptoms. 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 complicated by substance use disorder  Past Medical History:  Past Medical History:  Diagnosis Date   Allergy    Seasonal, Ultram (seizures)   Anemia 2005   Bipolar 1 disorder ([place])    Glaucoma    Seizures ([place]) 2008    Past Surgical History:  Procedure Laterality Date   BREAST EXCISIONAL BIOPSY Right    x 2   CESAREAN SECTION     x 2   LEEP  2000   Family History:  Family History  Problem Relation Age of Onset   Hypertension Mother    Hyperlipidemia Mother    Asthma Mother    Parkinson's disease Father    Lung cancer Maternal Grandmother    Other Paternal Grandfather 13       Cardiac arrest   Heart disease Paternal Grandfather    Tourette syndrome Daughter    Family Psychiatric  History: See previous Social History:  Social History   Substance and Sexual Activity  Alcohol Use Not Currently   Alcohol/week: 8.0 standard drinks of alcohol   Types: 8 Shots of liquor per week   Comment: last use 03/30/22 4x/yr     Social History   Substance and Sexual Activity  Drug Use Yes   Frequency: 1.0 times per week   Types: Marijuana, Cocaine, Heroin, Methamphetamines, MDMA (Ecstacy)   Comment: last use yesterday    Social History   Socioeconomic History   Marital status: Legally Separated    Spouse name: Not on file   Number of children: Not on file   Years of education: Not on file    Highest education level: Not on file  Occupational History   Not on file  Tobacco Use   Smoking status: Every Day    Packs/day: 0.50    Years: 9.00    Total pack years: 4.50    Types: E-cigarettes, Cigarettes, Cigars   Smokeless tobacco: Former    Types: Nurse, children's Use: Some days   Substances: Nicotine  Substance and Sexual Activity   Alcohol use: Not Currently    Alcohol/week: 8.0 standard drinks of alcohol    Types: 8 Shots of liquor per week    Comment: last use 03/30/22 4x/yr   Drug use: Yes    Frequency: 1.0 times per week    Types: Marijuana, Cocaine, Heroin, Methamphetamines, MDMA (Ecstacy)    Comment: last use yesterday   Sexual activity: Yes    Partners: Male    Birth control/protection: None  Other Topics Concern   Not on file  Social History Narrative   Not on file   Social Determinants of Health   Financial Resource Strain: Not on file  Food Insecurity: Not on file  Transportation Needs: Not on file  Physical Activity: Not on file  Stress: Not on file  Social Connections: Not on file   Additional Social History:  Sleep: Good  Appetite:  Poor  Current Medications: Current Facility-Administered Medications  Medication Dose Route Frequency Provider Last Rate Last Admin   acetaminophen (TYLENOL) tablet 650 mg  650 mg Oral Q6H PRN Sherlon Handing, NP   650 mg at 07/11/22 2107   alum & mag hydroxide-simeth (MAALOX/MYLANTA) 200-200-20 MG/5ML suspension 30 mL  30 mL Oral Q4H PRN Waldon Merl F, NP       ARIPiprazole (ABILIFY) tablet 15 mg  15 mg Oral Daily Florena Kozma T, MD   15 mg at 07/16/22 0805   divalproex (DEPAKOTE) DR tablet 1,250 mg  1,250 mg Oral QHS Yeiren Whitecotton T, MD   1,250 mg at 07/15/22 2100   feeding supplement (ENSURE ENLIVE / ENSURE PLUS) liquid 237 mL  237 mL Oral BID BM Metzli Pollick T, MD   237 mL at 07/15/22 1400   hydrOXYzine (ATARAX) tablet 25 mg  25 mg Oral TID PRN Sherlon Handing, NP   25 mg at 07/01/22 0902   lithium carbonate (ESKALITH) CR tablet 900 mg  900 mg Oral QHS Alyxandra Tenbrink T, MD   900 mg at 07/15/22 2100   lithium carbonate (LITHOBID) CR tablet 300 mg  300 mg Oral Daily Krish Bailly T, MD   300 mg at 07/16/22 8937   magnesium hydroxide (MILK OF MAGNESIA) suspension 30 mL  30 mL Oral Daily PRN Waldon Merl F, NP       nicotine polacrilex (NICORETTE) gum 2 mg  2 mg Oral PRN Truman Aceituno T, MD   2 mg at 07/13/22 1704   QUEtiapine (SEROQUEL) tablet 300 mg  300 mg Oral QHS Parks Ranger, DO   300 mg at 07/15/22 2100   senna-docusate (Senokot-S) tablet 2 tablet  2 tablet Oral Daily PRN Kavonte Bearse, [name], MD        Lab Results: No results found for this or any previous visit (from the past 48 hour(s)).  Blood Alcohol level:  Lab Results  Component Value Date   ETH <10 06/29/2022   ETH <10 34/28/7681    Metabolic Disorder Labs: Lab Results  Component Value Date   HGBA1C 4.9 12/25/2021   MPG 93.93 12/25/2021   MPG 114 06/02/2021   No results found for: "PROLACTIN" Lab Results  Component Value Date   CHOL 160 12/25/2021   TRIG 95 12/25/2021   HDL 58 12/25/2021   CHOLHDL 2.8 12/25/2021   VLDL 19 12/25/2021   LDLCALC 83 12/25/2021   LDLCALC 63 06/01/2021    Physical Findings: AIMS: Facial and Oral Movements Muscles of Facial Expression: None, normal Lips and Perioral Area: None, normal Jaw: None, normal Tongue: None, normal,Extremity Movements Upper (arms, wrists, hands, fingers): None, normal Lower (legs, knees, ankles, toes): None, normal, Trunk Movements Neck, shoulders, hips: None, normal, Overall Severity Severity of abnormal movements (highest score from questions above): None, normal Incapacitation due to abnormal movements: None, normal Patient's awareness of abnormal movements (rate only patient's report): No Awareness, Dental Status Current problems with teeth and/or dentures?: No Does patient usually wear  dentures?: No  CIWA:    COWS:     Musculoskeletal: Strength & Muscle Tone: within normal limits Gait & Station: normal Patient leans: N/A  Psychiatric Specialty Exam:  Presentation  General Appearance: Bizarre  Eye Contact:Fair  Speech:-- (non-linear)  Speech Volume:Normal  Handedness:Right   Mood and Affect  Mood:Dysphoric  Affect:Blunt   Thought Process  Thought Processes:Disorganized  Descriptions of Associations:Loose  Orientation:Partial  Thought Content:Illogical  History  of Schizophrenia/Schizoaffective disorder:Yes  Duration of Psychotic Symptoms:Greater than six months  Hallucinations:No data recorded Ideas of Reference:-- Pincus Badder)  Suicidal Thoughts:No data recorded Homicidal Thoughts:No data recorded  Sensorium  Memory:Immediate Poor  Judgment:Impaired  Insight:Lacking   Executive Functions  Concentration:Poor  Attention Span:Poor  Recall:Poor  Fund of Knowledge:Poor  Language:Poor   Psychomotor Activity  Psychomotor Activity:No data recorded  Assets  Assets:Resilience   Sleep  Sleep:No data recorded   Physical Exam: Physical Exam Vitals and nursing note reviewed.  Constitutional:      Appearance: Normal appearance.  HENT:     Head: Normocephalic and atraumatic.     Mouth/Throat:     Pharynx: Oropharynx is clear.  Eyes:     Pupils: Pupils are equal, round, and reactive to light.  Cardiovascular:     Rate and Rhythm: Normal rate and regular rhythm.  Pulmonary:     Effort: Pulmonary effort is normal.     Breath sounds: Normal breath sounds.  Abdominal:     General: Abdomen is flat.     Palpations: Abdomen is soft.  Musculoskeletal:        General: Normal range of motion.  Skin:    General: Skin is warm and dry.  Neurological:     General: No focal deficit present.     Mental Status: She is alert. Mental status is at baseline.  Psychiatric:        Attention and Perception: Attention normal.        Mood and  Affect: Mood normal. Affect is blunt.        Speech: Speech is delayed.        Behavior: Behavior is slowed.        Thought Content: Thought content normal.    Review of Systems  Constitutional: Negative.   HENT: Negative.    Eyes: Negative.   Respiratory: Negative.    Cardiovascular: Negative.   Gastrointestinal: Negative.   Musculoskeletal: Negative.   Skin: Negative.   Neurological: Negative.   Psychiatric/Behavioral:  Positive for depression. Negative for suicidal ideas.    Blood pressure (!) 85/64, pulse 61, temperature 97.7 F (36.5 C), temperature source Oral, resp. rate 18, height '5\' 1"'$  (1.549 m), weight 45 kg, last menstrual period 05/17/2022, SpO2 96 %. Body mass index is 18.74 kg/m.   Treatment Plan Summary: Plan probably cut back on some of her medicine because of her oversedation.  Encourage group attendance.  Encourage patient to be working on discharge planning.  Alethia Berthold, MD 07/16/2022, 10:37 AM

## 2022-07-16 NOTE — Progress Notes (Signed)
Recreation Therapy Notes   Date: 07/16/2022  Time: 10:55 am   Location: Craft room       Behavioral response: N/A   Intervention Topic: Self-care    Discussion/Intervention: Patient did not attend group.   Clinical Observations/Feedback:  Patient did not attend group.   Termaine Roupp LRT/CTRS        Ramadan Couey 07/16/2022 1:01 PM

## 2022-07-16 NOTE — [facility] Group Notes (Signed)
[facility] Group Notes:  (Nursing/MHT/Case Management/Adjunct)  Date:  07/16/2022  Time:  1:42 PM  Type of Therapy:   Community Meeting  Participation Level:  Did Not Attend   Adela Lank [facility] 07/16/2022, 1:42 PM

## 2022-07-16 NOTE — [facility] Group Notes (Signed)
[facility] Group Notes:  (Nursing/MHT/Case Management/Adjunct)  Date:  07/16/2022  Time:  3:29 AM  Type of Therapy:  Group Therapy  Participation Level:  Did Not Attend  Participation Quality:      Affect:      Cognitive:      Insight:  None  Engagement in Group:      Modes of Intervention:      Summary of Progress/Problems:  Autumn [name] 07/16/2022, 3:29 AM

## 2022-07-16 NOTE — Plan of Care (Signed)
Pt denies experiencing any physical pain/SI/HI/AVH at this time. Pt mood/affect appears as sad/depressed. Pt interacts minimally on the unit.   Problem: Education: Goal: Knowledge of General Education information will improve Description: Including pain rating scale, medication(s)/side effects and non-pharmacologic comfort measures Outcome: Progressing   Problem: Nutrition: Goal: Adequate nutrition will be maintained Outcome: Progressing

## 2022-07-16 NOTE — Group Note (Signed)
[place] LCSW Group Therapy Note   Group Date: 07/16/2022 Start Time: 1300 End Time: 1400  Type of Therapy and Topic:  Group Therapy:  Feelings around Relapse and Recovery  Participation Level:  Did Not Attend    Description of Group:    Patients in this group will discuss emotions they experience before and after a relapse. They will process how experiencing these feelings, or avoidance of experiencing them, relates to having a relapse. Facilitator will guide patients to explore emotions they have related to recovery. Patients will be encouraged to process which emotions are more powerful. They will be guided to discuss the emotional reaction significant others in their lives may have to patients' relapse or recovery. Patients will be assisted in exploring ways to respond to the emotions of others without this contributing to a relapse.  Therapeutic Goals: Patient will identify two or more emotions that lead to relapse for them:  Patient will identify two emotions that result when they relapse:  Patient will identify two emotions related to recovery:  Patient will demonstrate ability to communicate their needs through discussion and/or role plays.   Summary of Patient Progress: X   Therapeutic Modalities:   Cognitive Behavioral Therapy Solution-Focused Therapy Assertiveness Training Relapse Prevention Therapy   Shirl Harris, LCSW

## 2022-07-16 NOTE — Progress Notes (Signed)
Patient remains isolative to her room.  Reports not feeling well this evening.  Vitals obtained but were normal.  She is med compliant and received her meds without incident.  She endorses depression and high anxiety related to her placement issues. Denies si  hi avh.   Support and encouragement offered. Advised to enlist help form social work in regards to helping her get placed. Will continue to monitor with q 15 minute safety checks.     C Butler-Nicholson, LPN

## 2022-07-17 NOTE — Plan of Care (Signed)
D- Patient alert and oriented. Patient presented in a sullen, but pleasant mood on assessment stating that she slept "good" last night and had no complaints to voice to this Probation officer. Patient denied any signs/symptoms of depression/anxiety. Patient also denied SI, HI, AVH, and pain at this time. Per her self-inventory, patient's goal for today is "discharge", in which she will "make phone calls", in order to achieve her goal. However, patient remains in bed throughout the day, except for meals and medication.  A- Scheduled medications administered to patient, per MD orders. Support and encouragement provided.  Routine safety checks conducted every 15 minutes.  Patient informed to notify staff with problems or concerns.  R- No adverse drug reactions noted. Patient contracts for safety at this time. Patient compliant with medications. Patient receptive, calm, and cooperative. Patient interacts well with others on the unit.  Patient remains safe at this time.  Problem: Education: Goal: Knowledge of General Education information will improve Description: Including pain rating scale, medication(s)/side effects and non-pharmacologic comfort measures Outcome: Progressing   Problem: Health Behavior/Discharge Planning: Goal: Ability to manage health-related needs will improve Outcome: Progressing   Problem: Clinical Measurements: Goal: Ability to maintain clinical measurements within normal limits will improve Outcome: Progressing Goal: Will remain free from infection Outcome: Progressing Goal: Diagnostic test results will improve Outcome: Progressing Goal: Respiratory complications will improve Outcome: Progressing Goal: Cardiovascular complication will be avoided Outcome: Progressing   Problem: Activity: Goal: Risk for activity intolerance will decrease Outcome: Progressing   Problem: Nutrition: Goal: Adequate nutrition will be maintained Outcome: Progressing   Problem: Coping: Goal: Level  of anxiety will decrease Outcome: Progressing   Problem: Elimination: Goal: Will not experience complications related to bowel motility Outcome: Progressing Goal: Will not experience complications related to urinary retention Outcome: Progressing   Problem: Pain Managment: Goal: General experience of comfort will improve Outcome: Progressing   Problem: Safety: Goal: Ability to remain free from injury will improve Outcome: Progressing   Problem: Skin Integrity: Goal: Risk for impaired skin integrity will decrease Outcome: Progressing   Problem: Education: Goal: Knowledge of [facility] [facility] Education information/materials will improve Outcome: Progressing Goal: Emotional status will improve Outcome: Progressing Goal: Mental status will improve Outcome: Progressing Goal: Verbalization of understanding the information provided will improve Outcome: Progressing   Problem: Activity: Goal: Interest or engagement in activities will improve Outcome: Progressing Goal: Sleeping patterns will improve Outcome: Progressing   Problem: Coping: Goal: Ability to verbalize frustrations and anger appropriately will improve Outcome: Progressing Goal: Ability to demonstrate self-control will improve Outcome: Progressing   Problem: Health Behavior/Discharge Planning: Goal: Identification of resources available to assist in meeting health care needs will improve Outcome: Progressing Goal: Compliance with treatment plan for underlying cause of condition will improve Outcome: Progressing   Problem: Physical Regulation: Goal: Ability to maintain clinical measurements within normal limits will improve Outcome: Progressing   Problem: Safety: Goal: Periods of time without injury will increase Outcome: Progressing

## 2022-07-17 NOTE — Plan of Care (Signed)
Pt reports experiencing anxiety/depression at this time. Pt states it's just been one of those kinds of days. Pt is worried about getting into somewhere. Pt denies SI/HI/AVH or pain at this time. Pt requested prn medication for anxiety and was given prn medication to anxiety. Plan of care continued.  Problem: Coping: Goal: Level of anxiety will decrease Outcome: Not Progressing   Problem: Education: Goal: Emotional status will improve Outcome: Not Progressing

## 2022-07-17 NOTE — Progress Notes (Signed)
[facility] MD Progress Note  07/17/2022 11:11 AM Autumn [name]  MRN:  [id number] Subjective:  Pt denies experiencing any physical pain/SI/HI/AVH at this time. Pt mood/affect appears as sad/depressed. Pt interacts minimally on the unit.   Patient evaluate din her room this morning; she has been sleeping in today but is easily awaken. She reports feeling better today with improvement in her mood and is denying current sI/HI or any aVH. She slept well alst night and has fair appetite.  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 substance use disorder  Past Medical History:  Past Medical History:  Diagnosis Date   Allergy    Seasonal, Ultram (seizures)   Anemia 2005   Bipolar 1 disorder ([place])    Glaucoma    Seizures ([place]) 2008    Past Surgical History:  Procedure Laterality Date   BREAST EXCISIONAL BIOPSY Right    x 2   CESAREAN SECTION     x 2   LEEP  2000   Family History:  Family History  Problem Relation Age of Onset   Hypertension Mother    Hyperlipidemia Mother    Asthma Mother    Parkinson's disease Father    Lung cancer Maternal Grandmother    Other Paternal Grandfather 22       Cardiac arrest   Heart disease Paternal Grandfather    Tourette syndrome Daughter    Family Psychiatric  History:  Social History:  Social History   Substance and Sexual Activity  Alcohol Use Not Currently   Alcohol/week: 8.0 standard drinks of alcohol   Types: 8 Shots of liquor per week   Comment: last use 03/30/22 4x/yr     Social History   Substance and Sexual Activity  Drug Use Yes   Frequency: 1.0 times per week   Types: Marijuana, Cocaine, Heroin, Methamphetamines, MDMA (Ecstacy)   Comment: last use yesterday    Social History   Socioeconomic History   Marital  status: Legally Separated    Spouse name: Not on file   Number of children: Not on file   Years of education: Not on file   Highest education level: Not on file  Occupational History   Not on file  Tobacco Use   Smoking status: Every Day    Packs/day: 0.50    Years: 9.00    Total pack years: 4.50    Types: E-cigarettes, Cigarettes, Cigars   Smokeless tobacco: Former    Types: Nurse, children's Use: Some days   Substances: Nicotine  Substance and Sexual Activity   Alcohol use: Not Currently    Alcohol/week: 8.0 standard drinks of alcohol    Types: 8 Shots of liquor per week    Comment: last use 03/30/22 4x/yr   Drug use: Yes    Frequency: 1.0 times per week    Types: Marijuana, Cocaine, Heroin, Methamphetamines, MDMA (Ecstacy)    Comment: last use yesterday   Sexual activity: Yes    Partners: Male    Birth control/protection: None  Other Topics Concern   Not on file  Social History Narrative   Not on file   Social Determinants of Health   Financial Resource Strain: Not on file  Food Insecurity: Not on file  Transportation Needs: Not on file  Physical Activity: Not on file  Stress: Not  on file  Social Connections: Not on file   Additional Social History:                         Sleep: Good  Appetite:  Fair  Current Medications: Current Facility-Administered Medications  Medication Dose Route Frequency Provider Last Rate Last Admin   acetaminophen (TYLENOL) tablet 650 mg  650 mg Oral Q6H PRN Sherlon Handing, NP   650 mg at 07/11/22 2107   alum & mag hydroxide-simeth (MAALOX/MYLANTA) 200-200-20 MG/5ML suspension 30 mL  30 mL Oral Q4H PRN Waldon Merl F, NP       ARIPiprazole (ABILIFY) tablet 15 mg  15 mg Oral QHS Clapacs, [name], MD       divalproex (DEPAKOTE) DR tablet 1,250 mg  1,250 mg Oral QHS Clapacs, [name], MD   1,250 mg at 07/16/22 2108   feeding supplement (ENSURE ENLIVE / ENSURE PLUS) liquid 237 mL  237 mL Oral BID BM Clapacs,  [name], MD   237 mL at 07/17/22 1055   hydrOXYzine (ATARAX) tablet 25 mg  25 mg Oral TID PRN Sherlon Handing, NP   25 mg at 07/01/22 0902   lithium carbonate (ESKALITH) CR tablet 900 mg  900 mg Oral QHS Clapacs, [name], MD   900 mg at 07/16/22 2108   magnesium hydroxide (MILK OF MAGNESIA) suspension 30 mL  30 mL Oral Daily PRN Waldon Merl F, NP       nicotine polacrilex (NICORETTE) gum 2 mg  2 mg Oral PRN Clapacs, [name], MD   2 mg at 07/16/22 1636   QUEtiapine (SEROQUEL) tablet 300 mg  300 mg Oral QHS Parks Ranger, DO   300 mg at 07/16/22 2108   senna-docusate (Senokot-S) tablet 2 tablet  2 tablet Oral Daily PRN Clapacs, [name], MD        Lab Results: No results found for this or any previous visit (from the past 48 hour(s)).  Blood Alcohol level:  Lab Results  Component Value Date   ETH <10 06/29/2022   ETH <10 48/54/6270    Metabolic Disorder Labs: Lab Results  Component Value Date   HGBA1C 4.9 12/25/2021   MPG 93.93 12/25/2021   MPG 114 06/02/2021   No results found for: "PROLACTIN" Lab Results  Component Value Date   CHOL 160 12/25/2021   TRIG 95 12/25/2021   HDL 58 12/25/2021   CHOLHDL 2.8 12/25/2021   VLDL 19 12/25/2021   LDLCALC 83 12/25/2021   LDLCALC 63 06/01/2021    Physical Findings: AIMS: Facial and Oral Movements Muscles of Facial Expression: None, normal Lips and Perioral Area: None, normal Jaw: None, normal Tongue: None, normal,Extremity Movements Upper (arms, wrists, hands, fingers): None, normal Lower (legs, knees, ankles, toes): None, normal, Trunk Movements Neck, shoulders, hips: None, normal, Overall Severity Severity of abnormal movements (highest score from questions above): None, normal Incapacitation due to abnormal movements: None, normal Patient's awareness of abnormal movements (rate only patient's report): No Awareness, Dental Status Current problems with teeth and/or dentures?: No Does patient usually wear dentures?:  No  CIWA:    COWS:     Musculoskeletal: Strength & Muscle Tone: within normal limits Gait & Station: normal Patient leans: N/A  Psychiatric Specialty Exam:  Presentation  General Appearance: Bizarre  Eye Contact:Fair  Speech:-- (non-linear)  Speech Volume:Normal  Handedness:Right   Mood and Affect  Mood:Dysphoric  Affect:Blunt   Thought Process  Thought Processes:Disorganized  Descriptions  of Associations:Loose  Orientation:Partial  Thought Content:Illogical  History of Schizophrenia/Schizoaffective disorder:Yes  Duration of Psychotic Symptoms:Greater than six months  Hallucinations:No data recorded Ideas of Reference:-- Pincus Badder)  Suicidal Thoughts:No data recorded Homicidal Thoughts:No data recorded  Sensorium  Memory:Immediate Poor  Judgment:Impaired  Insight:Lacking   Executive Functions  Concentration:Poor  Attention Span:Poor  Recall:Poor  Fund of Knowledge:Poor  Language:Poor   Psychomotor Activity  Psychomotor Activity:No data recorded  Assets  Assets:Resilience   Sleep  Sleep:No data recorded   Physical Exam: Physical Exam ROS Blood pressure (!) 85/62, pulse 77, temperature 97.8 F (36.6 C), temperature source Oral, resp. rate 16, height '5\' 1"'$  (1.549 m), weight 45 kg, last menstrual period 05/17/2022, SpO2 100 %. Body mass index is 18.74 kg/m.   Treatment Plan Summary: Daily contact with patient to assess and evaluate symptoms and progress in treatment, Medication management, and Plan : Continue current medicines and doses.   Autumn [name] 07/17/2022, 11:11 AM

## 2022-07-18 NOTE — Progress Notes (Signed)
D: Patient alert and oriented. Patient denies pain. Patient denies anxiety and depression. Patient denies SI/HI/AVH. Patient has been isolative to room during shift with exception to coming out for meals. Patient did stay out on the unit after dinner and made phone calls.   A: Patient did not have any scheduled medications during shift, except for scheduled Ensures. Support and encouragement provided to patient.  Q15 minute safety checks maintained.   R: Patient compliant with treatment plan.  Patient remains safe on the unit at this time.

## 2022-07-18 NOTE — Group Note (Signed)
[place] LCSW Group Therapy Note   Group Date: 07/18/2022 Start Time: 1200 End Time: 1300   Type of Therapy/Topic:  Group Therapy:  Emotion Regulation  Participation Level:  Did Not Attend   Mood:  Description of Group:    The purpose of this group is to assist patients in learning to regulate negative emotions and experience positive emotions. Patients will be guided to discuss ways in which they have been vulnerable to their negative emotions. These vulnerabilities will be juxtaposed with experiences of positive emotions or situations, and patients challenged to use positive emotions to combat negative ones. Special emphasis will be placed on coping with negative emotions in conflict situations, and patients will process healthy conflict resolution skills.  Therapeutic Goals: Patient will identify two positive emotions or experiences to reflect on in order to balance out negative emotions:  Patient will label two or more emotions that they find the most difficult to experience:  Patient will be able to demonstrate positive conflict resolution skills through discussion or role plays:   Summary of Patient Progress: Patient did not attend group despite encouraged participation.      Therapeutic Modalities:   Cognitive Behavioral Therapy Feelings Identification Dialectical Behavioral Therapy   Durenda Hurt, [place]

## 2022-07-18 NOTE — Plan of Care (Signed)
  Problem: Education: Goal: Knowledge of [facility] Education information/materials will improve Outcome: Progressing Goal: Verbalization of understanding the information provided will improve Outcome: Progressing   Problem: Health Behavior/Discharge Planning: Goal: Compliance with treatment plan for underlying cause of condition will improve Outcome: Progressing   Problem: Safety: Goal: Periods of time without injury will increase Outcome: Progressing

## 2022-07-18 NOTE — Progress Notes (Signed)
[facility] MD Progress Note  07/18/2022 9:55 AM Autumn [name]  MRN:  [id number] Subjective:  Pt reports experiencing anxiety/depression at this time. Pt states it's just been one of those kinds of days. Pt is worried about getting into somewhere. Pt denies SI/HI/AVH or pain at this time. Pt requested prn medication for anxiety.   Patient reports feeling down and anxious today, mainly worried about her housing. She is calm and compliant with her medicines and is generally laying in her room. She is denying current SI/HI and does not believe she needs any meds adjustment.  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 complicated by substance use disorder  Past Medical History:  Past Medical History:  Diagnosis Date   Allergy    Seasonal, Ultram (seizures)   Anemia 2005   Bipolar 1 disorder ([place])    Glaucoma    Seizures ([place]) 2008    Past Surgical History:  Procedure Laterality Date   BREAST EXCISIONAL BIOPSY Right    x 2   CESAREAN SECTION     x 2   LEEP  2000   Family History:  Family History  Problem Relation Age of Onset   Hypertension Mother    Hyperlipidemia Mother    Asthma Mother    Parkinson's disease Father    Lung cancer Maternal Grandmother    Other Paternal Grandfather 62       Cardiac arrest   Heart disease Paternal Grandfather    Tourette syndrome Daughter    Family Psychiatric  History:  Social History:  Social History   Substance and Sexual Activity  Alcohol Use Not Currently   Alcohol/week: 8.0 standard drinks of alcohol   Types: 8 Shots of liquor per week   Comment: last use 03/30/22 4x/yr     Social History   Substance and Sexual Activity  Drug Use Yes   Frequency: 1.0 times per week   Types: Marijuana, Cocaine, Heroin, Methamphetamines, MDMA (Ecstacy)    Comment: last use yesterday    Social History   Socioeconomic History   Marital status: Legally Separated    Spouse name: Not on file   Number of children: Not on file   Years of education: Not on file   Highest education level: Not on file  Occupational History   Not on file  Tobacco Use   Smoking status: Every Day    Packs/day: 0.50    Years: 9.00    Total pack years: 4.50    Types: E-cigarettes, Cigarettes, Cigars   Smokeless tobacco: Former    Types: Nurse, children's Use: Some days   Substances: Nicotine  Substance and Sexual Activity   Alcohol use: Not Currently    Alcohol/week: 8.0 standard drinks of alcohol    Types: 8 Shots of liquor per week    Comment: last use 03/30/22 4x/yr   Drug use: Yes    Frequency: 1.0 times per week    Types: Marijuana, Cocaine, Heroin, Methamphetamines, MDMA (Ecstacy)    Comment: last use yesterday   Sexual activity: Yes    Partners: Male    Birth control/protection: None  Other Topics Concern   Not on file  Social History Narrative   Not on file   Social Determinants of Health   Financial Resource Strain: Not on file  Food Insecurity: Not on  file  Transportation Needs: Not on file  Physical Activity: Not on file  Stress: Not on file  Social Connections: Not on file   Additional Social History:                         Sleep: Fair  Appetite:  Fair  Current Medications: Current Facility-Administered Medications  Medication Dose Route Frequency Provider Last Rate Last Admin   acetaminophen (TYLENOL) tablet 650 mg  650 mg Oral Q6H PRN Waldon Merl F, NP   650 mg at 07/11/22 2107   alum & mag hydroxide-simeth (MAALOX/MYLANTA) 200-200-20 MG/5ML suspension 30 mL  30 mL Oral Q4H PRN Waldon Merl F, NP       ARIPiprazole (ABILIFY) tablet 15 mg  15 mg Oral QHS Clapacs, [name], MD   15 mg at 07/17/22 2155   divalproex (DEPAKOTE) DR tablet 1,250 mg  1,250 mg Oral QHS Clapacs, [name], MD   1,250 mg at  07/17/22 2155   feeding supplement (ENSURE ENLIVE / ENSURE PLUS) liquid 237 mL  237 mL Oral BID BM Clapacs, [name], MD   237 mL at 07/17/22 1641   hydrOXYzine (ATARAX) tablet 25 mg  25 mg Oral TID PRN Sherlon Handing, NP   25 mg at 07/01/22 0902   lithium carbonate (ESKALITH) CR tablet 900 mg  900 mg Oral QHS Clapacs, [name], MD   900 mg at 07/17/22 2156   magnesium hydroxide (MILK OF MAGNESIA) suspension 30 mL  30 mL Oral Daily PRN Waldon Merl F, NP       nicotine polacrilex (NICORETTE) gum 2 mg  2 mg Oral PRN Clapacs, [name], MD   2 mg at 07/16/22 1636   QUEtiapine (SEROQUEL) tablet 300 mg  300 mg Oral QHS Parks Ranger, DO   300 mg at 07/17/22 2155   senna-docusate (Senokot-S) tablet 2 tablet  2 tablet Oral Daily PRN Clapacs, [name], MD        Lab Results: No results found for this or any previous visit (from the past 48 hour(s)).  Blood Alcohol level:  Lab Results  Component Value Date   ETH <10 06/29/2022   ETH <10 09/01/8526    Metabolic Disorder Labs: Lab Results  Component Value Date   HGBA1C 4.9 12/25/2021   MPG 93.93 12/25/2021   MPG 114 06/02/2021   No results found for: "PROLACTIN" Lab Results  Component Value Date   CHOL 160 12/25/2021   TRIG 95 12/25/2021   HDL 58 12/25/2021   CHOLHDL 2.8 12/25/2021   VLDL 19 12/25/2021   LDLCALC 83 12/25/2021   LDLCALC 63 06/01/2021    Physical Findings: AIMS: Facial and Oral Movements Muscles of Facial Expression: None, normal Lips and Perioral Area: None, normal Jaw: None, normal Tongue: None, normal,Extremity Movements Upper (arms, wrists, hands, fingers): None, normal Lower (legs, knees, ankles, toes): None, normal, Trunk Movements Neck, shoulders, hips: None, normal, Overall Severity Severity of abnormal movements (highest score from questions above): None, normal Incapacitation due to abnormal movements: None, normal Patient's awareness of abnormal movements (rate only patient's report): No  Awareness, Dental Status Current problems with teeth and/or dentures?: No Does patient usually wear dentures?: No  CIWA:    COWS:     Musculoskeletal: Strength & Muscle Tone: within normal limits Gait & Station: normal Patient leans: N/A  Psychiatric Specialty Exam:  Presentation  General Appearance: Casual; Neat  Eye Contact:Fair  Speech:Slow  Speech Volume:Decreased  Handedness:Right   Mood and Affect  Mood:Depressed  Affect:Flat   Thought Process  Thought Processes:Coherent; Goal Directed  Descriptions of Associations:Circumstantial  Orientation:Full (Time, Place and Person)  Thought Content:Logical  History of Schizophrenia/Schizoaffective disorder:Yes  Duration of Psychotic Symptoms:Greater than six months  Hallucinations:Hallucinations: None  Ideas of Reference:None  Suicidal Thoughts:Suicidal Thoughts: No  Homicidal Thoughts:Homicidal Thoughts: No   Sensorium  Memory:Immediate Fair; Immediate Good  Judgment:Fair  Insight:Fair   Executive Functions  Concentration:Fair  Attention Span:Fair  [place]   Psychomotor Activity  Psychomotor Activity:Psychomotor Activity: Decreased   Assets  Assets:Communication Skills; Desire for Improvement   Sleep  Sleep:Sleep: Fair    Physical Exam: Physical Exam Constitutional:      Appearance: Normal appearance. She is normal weight.  HENT:     Head: Normocephalic and atraumatic.     Right Ear: External ear normal.     Left Ear: External ear normal.     Nose: Nose normal.     Mouth/Throat:     Mouth: Mucous membranes are dry.     Pharynx: Oropharynx is clear.  Eyes:     Extraocular Movements: Extraocular movements intact.     Conjunctiva/sclera: Conjunctivae normal.     Pupils: Pupils are equal, round, and reactive to light.  Cardiovascular:     Rate and Rhythm: Normal rate and regular rhythm.     Pulses: Normal pulses.     Heart  sounds: Normal heart sounds.  Pulmonary:     Effort: Pulmonary effort is normal.     Breath sounds: Normal breath sounds.  Abdominal:     General: Abdomen is flat. Bowel sounds are normal.     Palpations: Abdomen is soft.  Musculoskeletal:        General: Normal range of motion.     Cervical back: Normal range of motion and neck supple.  Skin:    General: Skin is warm and dry.  Neurological:     General: No focal deficit present.     Mental Status: She is alert.    Review of Systems  Constitutional: Negative.   HENT: Negative.    Eyes: Negative.   Respiratory: Negative.    Cardiovascular: Negative.   Gastrointestinal: Negative.   Genitourinary: Negative.   Musculoskeletal: Negative.   Skin: Negative.   Neurological: Negative.   Endo/Heme/Allergies: Negative.   Psychiatric/Behavioral:  Positive for depression. The patient is nervous/anxious.    Blood pressure (!) 85/62, pulse 63, temperature 97.9 F (36.6 C), temperature source Oral, resp. rate 18, height '5\' 1"'$  (1.549 m), weight 45 kg, last menstrual period 05/17/2022, SpO2 100 %. Body mass index is 18.74 kg/m.   Treatment Plan Summary: Daily contact with patient to assess and evaluate symptoms and progress in treatment, Medication management, and Plan Continue current medicines and doses.   Autumn [name] 07/18/2022, 9:55 AM

## 2022-07-18 NOTE — Plan of Care (Signed)
  Problem: Education: Goal: Knowledge of General Education information will improve Description: Including pain rating scale, medication(s)/side effects and non-pharmacologic comfort measures Outcome: Progressing   Problem: Health Behavior/Discharge Planning: Goal: Ability to manage health-related needs will improve Outcome: Progressing   Problem: Clinical Measurements: Goal: Ability to maintain clinical measurements within normal limits will improve Outcome: Progressing   Problem: Clinical Measurements: Goal: Will remain free from infection Outcome: Progressing Scheduled medications administered per Provider order. Support and encouragement provided. Routine safety checks conducted every 15 minutes. Patient notified to inform staff with problems or concerns. No adverse drug reactions noted. Patient contracts for safety at this time. Will continue to monitor.

## 2022-07-19 DIAGNOSIS — F3164 Bipolar disorder, current episode mixed, severe, with psychotic features: Secondary | ICD-10-CM | POA: Diagnosis not present

## 2022-07-19 NOTE — Group Note (Signed)
[facility] LCSW Group Therapy Note    Group Date: 07/19/2022 Start Time: 1300 End Time: 1400  Type of Therapy and Topic:  Group Therapy:  Overcoming Obstacles  Participation Level:  BHH PARTICIPATION LEVEL: Minimal   Description of Group:   In this group patients will be encouraged to explore what they see as obstacles to their own wellness and recovery. They will be guided to discuss their thoughts, feelings, and behaviors related to these obstacles. The group will process together ways to cope with barriers, with attention given to specific choices patients can make. Each patient will be challenged to identify changes they are motivated to make in order to overcome their obstacles. This group will be process-oriented, with patients participating in exploration of their own experiences as well as giving and receiving support and challenge from other group members.  Therapeutic Goals: 1. Patient will identify personal and current obstacles as they relate to admission. 2. Patient will identify barriers that currently interfere with their wellness or overcoming obstacles.  3. Patient will identify feelings, thought process and behaviors related to these barriers. 4. Patient will identify two changes they are willing to make to overcome these obstacles:    Summary of Patient Progress Patient was present for the entirety of the meetings. She was minimally engaged in the discussion. Unable to ascertain the level of insight. However, pt was appropriately behaved and her comments, when made, were pertinent to the discussion.    Therapeutic Modalities:   Cognitive Behavioral Therapy Solution Focused Therapy Motivational Interviewing Relapse Prevention Therapy   Shirl Harris, LCSW

## 2022-07-19 NOTE — Plan of Care (Signed)
  Problem: Safety: Goal: Ability to remain free from injury will improve Outcome: Progressing   

## 2022-07-19 NOTE — Progress Notes (Signed)
Recreation Therapy Notes  Date: 07/19/2022  Time: 10:30 am   Location: Craft room       Behavioral response: N/A   Intervention Topic: Problem- Solving    Discussion/Intervention: Patient did not attend group.   Clinical Observations/Feedback:  Patient did not attend group.   Abdirahman Chittum LRT/CTRS        Morad Tal 07/19/2022 12:30 PM

## 2022-07-19 NOTE — Plan of Care (Signed)
D: Patient alert and oriented. Patient denies pain. Patient denies anxiety and depression. Patient denies SI/HI/AVH. Patient has been isolative to room during shift with exception to coming out for meals and making phone calls.   A: Patient did not have scheduled medication during this shift, besides scheduled ensure throughout shift. Q15 minute safety checks maintained.   R: Patient compliant with treatment plan.  Patient remains safe on the unit at this time.  Problem: Education: Goal: Knowledge of [place] [facility] Education information/materials will improve Outcome: Progressing Goal: Emotional status will improve Outcome: Progressing Goal: Mental status will improve Outcome: Progressing Goal: Verbalization of understanding the information provided will improve Outcome: Progressing   Problem: Health Behavior/Discharge Planning: Goal: Compliance with treatment plan for underlying cause of condition will improve Outcome: Progressing   Problem: Safety: Goal: Periods of time without injury will increase Outcome: Progressing

## 2022-07-19 NOTE — BHH Group Notes (Signed)
[place] Group Notes:  (Nursing/MHT/Case Management/Adjunct)  Date:  07/19/2022  Time:  10:48 PM  Type of Therapy:  Group Therapy Wrap Up   Participation Level:  Did Not Attend    Summary of Progress/Problems:  Maglione,Silvano Garofano E 07/19/2022, 10:48 PM

## 2022-07-19 NOTE — Progress Notes (Signed)
[facility] MD Progress Note  07/19/2022 3:04 PM Autumn [name]  MRN:  [id number] Subjective: Autumn [name] says that she is feeling good.  Nearly back to baseline.  Not sad and depressed.  Says that she is looking forward to making contact with some places she may go to live.  Still spends a lot of time in bed but nursing has noticed a general improvement Principal Problem: Bipolar affective disorder ([place]) Diagnosis: Principal Problem:   Bipolar affective disorder ([place])  Total Time spent with patient: 30 minutes  Past Psychiatric History: Past history of bipolar disorder substance use disorder homelessness and multiple prior hospital stays  Past Medical History:  Past Medical History:  Diagnosis Date   Allergy    Seasonal, Ultram (seizures)   Anemia 2005   Bipolar 1 disorder ([place])    Glaucoma    Seizures ([place]) 2008    Past Surgical History:  Procedure Laterality Date   BREAST EXCISIONAL BIOPSY Right    x 2   CESAREAN SECTION     x 2   LEEP  2000   Family History:  Family History  Problem Relation Age of Onset   Hypertension Mother    Hyperlipidemia Mother    Asthma Mother    Parkinson's disease Father    Lung cancer Maternal Grandmother    Other Paternal Grandfather 64       Cardiac arrest   Heart disease Paternal Grandfather    Tourette syndrome Daughter    Family Psychiatric  History: See previous Social History:  Social History   Substance and Sexual Activity  Alcohol Use Not Currently   Alcohol/week: 8.0 standard drinks of alcohol   Types: 8 Shots of liquor per week   Comment: last use 03/30/22 4x/yr     Social History   Substance and Sexual Activity  Drug Use Yes   Frequency: 1.0 times per week   Types: Marijuana, Cocaine, Heroin, Methamphetamines, MDMA (Ecstacy)   Comment: last use yesterday    Social History   Socioeconomic History   Marital status: Legally Separated    Spouse name: Not on file   Number of children: Not on file   Years of education: Not on  file   Highest education level: Not on file  Occupational History   Not on file  Tobacco Use   Smoking status: Every Day    Packs/day: 0.50    Years: 9.00    Total pack years: 4.50    Types: E-cigarettes, Cigarettes, Cigars   Smokeless tobacco: Former    Types: Nurse, children's Use: Some days   Substances: Nicotine  Substance and Sexual Activity   Alcohol use: Not Currently    Alcohol/week: 8.0 standard drinks of alcohol    Types: 8 Shots of liquor per week    Comment: last use 03/30/22 4x/yr   Drug use: Yes    Frequency: 1.0 times per week    Types: Marijuana, Cocaine, Heroin, Methamphetamines, MDMA (Ecstacy)    Comment: last use yesterday   Sexual activity: Yes    Partners: Male    Birth control/protection: None  Other Topics Concern   Not on file  Social History Narrative   Not on file   Social Determinants of Health   Financial Resource Strain: Not on file  Food Insecurity: Not on file  Transportation Needs: Not on file  Physical Activity: Not on file  Stress: Not on file  Social Connections: Not on file   Additional Social History:  Sleep: Fair  Appetite:  Fair  Current Medications: Current Facility-Administered Medications  Medication Dose Route Frequency Provider Last Rate Last Admin   acetaminophen (TYLENOL) tablet 650 mg  650 mg Oral Q6H PRN Sherlon Handing, NP   650 mg at 07/11/22 2107   alum & mag hydroxide-simeth (MAALOX/MYLANTA) 200-200-20 MG/5ML suspension 30 mL  30 mL Oral Q4H PRN Waldon Merl F, NP       ARIPiprazole (ABILIFY) tablet 15 mg  15 mg Oral QHS Christabelle Hanzlik T, MD   15 mg at 07/18/22 2100   divalproex (DEPAKOTE) DR tablet 1,250 mg  1,250 mg Oral QHS Dareth Andrew T, MD   1,250 mg at 07/18/22 2101   feeding supplement (ENSURE ENLIVE / ENSURE PLUS) liquid 237 mL  237 mL Oral BID BM Melesa Lecy T, MD   237 mL at 07/19/22 1407   hydrOXYzine (ATARAX) tablet 25 mg  25 mg Oral TID PRN  Sherlon Handing, NP   25 mg at 07/18/22 2059   lithium carbonate (ESKALITH) CR tablet 900 mg  900 mg Oral QHS Kimarie Coor T, MD   900 mg at 07/18/22 2101   magnesium hydroxide (MILK OF MAGNESIA) suspension 30 mL  30 mL Oral Daily PRN Waldon Merl F, NP       nicotine polacrilex (NICORETTE) gum 2 mg  2 mg Oral PRN Mylez Venable, [name], MD   2 mg at 07/19/22 1202   QUEtiapine (SEROQUEL) tablet 300 mg  300 mg Oral QHS Parks Ranger, DO   300 mg at 07/18/22 2059   senna-docusate (Senokot-S) tablet 2 tablet  2 tablet Oral Daily PRN Lateshia Schmoker, [name], MD        Lab Results: No results found for this or any previous visit (from the past 48 hour(s)).  Blood Alcohol level:  Lab Results  Component Value Date   ETH <10 06/29/2022   ETH <10 74/25/9563    Metabolic Disorder Labs: Lab Results  Component Value Date   HGBA1C 4.9 12/25/2021   MPG 93.93 12/25/2021   MPG 114 06/02/2021   No results found for: "PROLACTIN" Lab Results  Component Value Date   CHOL 160 12/25/2021   TRIG 95 12/25/2021   HDL 58 12/25/2021   CHOLHDL 2.8 12/25/2021   VLDL 19 12/25/2021   LDLCALC 83 12/25/2021   LDLCALC 63 06/01/2021    Physical Findings: AIMS: Facial and Oral Movements Muscles of Facial Expression: None, normal Lips and Perioral Area: None, normal Jaw: None, normal Tongue: None, normal,Extremity Movements Upper (arms, wrists, hands, fingers): None, normal Lower (legs, knees, ankles, toes): None, normal, Trunk Movements Neck, shoulders, hips: None, normal, Overall Severity Severity of abnormal movements (highest score from questions above): None, normal Incapacitation due to abnormal movements: None, normal Patient's awareness of abnormal movements (rate only patient's report): No Awareness, Dental Status Current problems with teeth and/or dentures?: No Does patient usually wear dentures?: No  CIWA:    COWS:     Musculoskeletal: Strength & Muscle Tone: within normal limits Gait  & Station: normal Patient leans: N/A  Psychiatric Specialty Exam:  Presentation  General Appearance: Casual; Neat  Eye Contact:Fair  Speech:Slow  Speech Volume:Decreased  Handedness:Right   Mood and Affect  Mood:Depressed  Affect:Flat   Thought Process  Thought Processes:Coherent; Goal Directed  Descriptions of Associations:Circumstantial  Orientation:Full (Time, Place and Person)  Thought Content:Logical  History of Schizophrenia/Schizoaffective disorder:Yes  Duration of Psychotic Symptoms:Greater than six months  Hallucinations:No data recorded Ideas of Reference:None  Suicidal Thoughts:No data recorded Homicidal Thoughts:No data recorded  Sensorium  Memory:Immediate Fair; Immediate Good  Judgment:Fair  Insight:Fair   Executive Functions  Concentration:Fair  Attention Span:Fair  [place]   Psychomotor Activity  Psychomotor Activity:No data recorded  Assets  Assets:Communication Skills; Desire for Improvement   Sleep  Sleep:No data recorded   Physical Exam: Physical Exam Vitals and nursing note reviewed.  Constitutional:      Appearance: Normal appearance.  HENT:     Head: Normocephalic and atraumatic.     Mouth/Throat:     Pharynx: Oropharynx is clear.  Eyes:     Pupils: Pupils are equal, round, and reactive to light.  Cardiovascular:     Rate and Rhythm: Normal rate and regular rhythm.  Pulmonary:     Effort: Pulmonary effort is normal.     Breath sounds: Normal breath sounds.  Abdominal:     General: Abdomen is flat.     Palpations: Abdomen is soft.  Musculoskeletal:        General: Normal range of motion.  Skin:    General: Skin is warm and dry.  Neurological:     General: No focal deficit present.     Mental Status: She is alert. Mental status is at baseline.  Psychiatric:        Attention and Perception: She is inattentive.        Mood and Affect: Mood normal. Affect  is blunt.        Speech: Speech is delayed.        Behavior: Behavior is slowed.        Thought Content: Thought content normal.        Cognition and Memory: Cognition is impaired.    Review of Systems  Constitutional: Negative.   HENT: Negative.    Eyes: Negative.   Respiratory: Negative.    Cardiovascular: Negative.   Gastrointestinal: Negative.   Musculoskeletal: Negative.   Skin: Negative.   Neurological: Negative.   Psychiatric/Behavioral:  Negative for depression and suicidal ideas. The patient is nervous/anxious.    Blood pressure (!) 85/59, pulse (!) 57, temperature 97.7 F (36.5 C), temperature source Oral, resp. rate 18, height '5\' 1"'$  (1.549 m), weight 45 kg, last menstrual period 05/17/2022, SpO2 97 %. Body mass index is 18.74 kg/m.   Treatment Plan Summary: Medication management and Plan no change to medication.  Encourage her to follow-up with all of the options she has for a place to live.  Encourage group attendance.  Alethia Berthold, MD 07/19/2022, 3:04 PM

## 2022-07-20 DIAGNOSIS — F3164 Bipolar disorder, current episode mixed, severe, with psychotic features: Secondary | ICD-10-CM | POA: Diagnosis not present

## 2022-07-20 NOTE — Plan of Care (Signed)
  Problem: Education: Goal: Knowledge of General Education information will improve Description: Including pain rating scale, medication(s)/side effects and non-pharmacologic comfort measures Outcome: Progressing   Problem: Health Behavior/Discharge Planning: Goal: Ability to manage health-related needs will improve Outcome: Progressing   Problem: Clinical Measurements: Goal: Ability to maintain clinical measurements within normal limits will improve Outcome: Progressing Goal: Will remain free from infection Outcome: Progressing Goal: Diagnostic test results will improve Outcome: Progressing Goal: Respiratory complications will improve Outcome: Progressing Goal: Cardiovascular complication will be avoided Outcome: Progressing   Problem: Safety: Goal: Periods of time without injury will increase Outcome: Progressing   Problem: Physical Regulation: Goal: Ability to maintain clinical measurements within normal limits will improve Outcome: Progressing   Problem: Health Behavior/Discharge Planning: Goal: Identification of resources available to assist in meeting health care needs will improve Outcome: Progressing Goal: Compliance with treatment plan for underlying cause of condition will improve Outcome: Progressing   Problem: Coping: Goal: Ability to verbalize frustrations and anger appropriately will improve Outcome: Progressing Goal: Ability to demonstrate self-control will improve Outcome: Progressing   

## 2022-07-20 NOTE — Progress Notes (Signed)
Recreation Therapy Notes  Date: 07/20/2022  Time: 10:30 am   Location: Courtyard     Behavioral response: N/A   Intervention Topic: Wellness   Discussion/Intervention: Patient did not attend group.   Clinical Observations/Feedback:  Patient did not attend group.   Jace Dowe LRT/CTRS         Garo Heidelberg 07/20/2022 12:59 PM

## 2022-07-20 NOTE — BHH Group Notes (Signed)
[place] Group Notes:  (Nursing/MHT/Case Management/Adjunct)  Date:  07/20/2022  Time:  9:46 AM  Type of Therapy:   community meeting  Participation Level:  Did Not Attend    Antonieta Pert 07/20/2022, 9:46 AM

## 2022-07-20 NOTE — Group Note (Signed)
[facility] LCSW Group Therapy Note   Group Date: 07/20/2022 Start Time: 1300 End Time: 1400  Type of Therapy/Topic:  Group Therapy:  Feelings about Diagnosis  Participation Level:  Minimal    Description of Group:    This group will allow patients to explore their thoughts and feelings about diagnoses they have received. Patients will be guided to explore their level of understanding and acceptance of these diagnoses. Facilitator will encourage patients to process their thoughts and feelings about the reactions of others to their diagnosis, and will guide patients in identifying ways to discuss their diagnosis with significant others in their lives. This group will be process-oriented, with patients participating in exploration of their own experiences as well as giving and receiving support and challenge from other group members.   Therapeutic Goals: 1. Patient will demonstrate understanding of diagnosis as evidence by identifying two or more symptoms of the disorder:  2. Patient will be able to express two feelings regarding the diagnosis 3. Patient will demonstrate ability to communicate their needs through discussion and/or role plays  Summary of Patient Progress: Patient was present for the entirety of group. She participated in the beginning but less so throughout group. She stated that she did not like to accept her diagnosis because it is always changing. Pt did appear to attend to the conversation. She appeared open and receptive to comments/feedback from both peers and facilitator.    Therapeutic Modalities:   Cognitive Behavioral Therapy Brief Therapy Feelings Identification    Shirl Harris, LCSW

## 2022-07-20 NOTE — Progress Notes (Signed)
Patient pleasant and cooperative. Denies all. Medications given as prescribed. Isolative to self and room. No interaction with peers. Minimal with staff. Able to make needs known. Encouragement and support provided. Safety checks maintained. Meds given as prescribed. Pt receptive and remains safe on unit with q 15 min checks.

## 2022-07-20 NOTE — BH IP Treatment Plan (Signed)
Interdisciplinary Treatment and Diagnostic Plan Update  07/20/2022 Time of Session: 8:30AM Autumn [name] MRN: [id number]  Principal Diagnosis: Bipolar affective disorder [facility])  Secondary Diagnoses: Principal Problem:   Bipolar affective disorder ([place])   Current Medications:  Current Facility-Administered Medications  Medication Dose Route Frequency Provider Last Rate Last Admin   acetaminophen (TYLENOL) tablet 650 mg  650 mg Oral Q6H PRN Waldon Merl F, NP   650 mg at 07/11/22 2107   alum & mag hydroxide-simeth (MAALOX/MYLANTA) 200-200-20 MG/5ML suspension 30 mL  30 mL Oral Q4H PRN Waldon Merl F, NP       ARIPiprazole (ABILIFY) tablet 15 mg  15 mg Oral QHS Clapacs, [name], MD   15 mg at 07/19/22 2118   divalproex (DEPAKOTE) DR tablet 1,250 mg  1,250 mg Oral QHS Clapacs, [name], MD   1,250 mg at 07/19/22 2118   feeding supplement (ENSURE ENLIVE / ENSURE PLUS) liquid 237 mL  237 mL Oral BID BM Clapacs, [name], MD   237 mL at 07/19/22 1407   hydrOXYzine (ATARAX) tablet 25 mg  25 mg Oral TID PRN Sherlon Handing, NP   25 mg at 07/18/22 2059   lithium carbonate (ESKALITH) CR tablet 900 mg  900 mg Oral QHS Clapacs, [name], MD   900 mg at 07/19/22 2118   magnesium hydroxide (MILK OF MAGNESIA) suspension 30 mL  30 mL Oral Daily PRN Waldon Merl F, NP       nicotine polacrilex (NICORETTE) gum 2 mg  2 mg Oral PRN Clapacs, [name], MD   2 mg at 07/19/22 1202   QUEtiapine (SEROQUEL) tablet 300 mg  300 mg Oral QHS Parks Ranger, DO   300 mg at 07/19/22 2118   senna-docusate (Senokot-S) tablet 2 tablet  2 tablet Oral Daily PRN Clapacs, [name], MD       PTA Medications: Medications Prior to Admission  Medication Sig Dispense Refill Last Dose   ARIPiprazole (ABILIFY) 15 MG tablet Take 1 tablet (15 mg total) by mouth daily. (Patient not taking: Reported on 05/29/2022) 30 tablet 0    divalproex (DEPAKOTE) 250 MG DR tablet Take 2 tablets (500 mg total) by mouth every 12 (twelve)  hours. (Patient not taking: Reported on 05/29/2022) 120 tablet 0    lithium carbonate (LITHOBID) 300 MG CR tablet Take 2 tablets (600 mg total) by mouth every 12 (twelve) hours. (Patient not taking: Reported on 05/29/2022) 120 tablet 0    norethindrone (MICRONOR) 0.35 MG tablet Take 1 tablet (0.35 mg total) by mouth daily. (Patient not taking: Reported on 05/20/2022) 28 tablet 13    QUEtiapine (SEROQUEL) 200 MG tablet Take 1 tablet (200 mg total) by mouth at bedtime. (Patient not taking: Reported on 05/29/2022) 30 tablet 0     Patient Stressors:    Patient Strengths:    Treatment Modalities: Medication Management, Group therapy, Case management,  1 to 1 session with clinician, Psychoeducation, Recreational therapy.   Physician Treatment Plan for Primary Diagnosis: Bipolar affective disorder ([place]) Long Term Goal(s): Improvement in symptoms so as ready for discharge   Short Term Goals: Ability to identify and develop effective coping behaviors will improve Ability to maintain clinical measurements within normal limits will improve Compliance with prescribed medications will improve Ability to demonstrate self-control will improve  Medication Management: Evaluate patient's response, side effects, and tolerance of medication regimen.  Therapeutic Interventions: 1 to 1 sessions, Unit Group sessions and Medication administration.  Evaluation of Outcomes: Progressing  Physician Treatment  Plan for Secondary Diagnosis: Principal Problem:   Bipolar affective disorder ([place])  Long Term Goal(s): Improvement in symptoms so as ready for discharge   Short Term Goals: Ability to identify and develop effective coping behaviors will improve Ability to maintain clinical measurements within normal limits will improve Compliance with prescribed medications will improve Ability to demonstrate self-control will improve     Medication Management: Evaluate patient's response, side effects, and tolerance of  medication regimen.  Therapeutic Interventions: 1 to 1 sessions, Unit Group sessions and Medication administration.  Evaluation of Outcomes: Progressing   RN Treatment Plan for Primary Diagnosis: Bipolar affective disorder ([place]) Long Term Goal(s): Knowledge of disease and therapeutic regimen to maintain health will improve  Short Term Goals: Ability to remain free from injury will improve, Ability to verbalize frustration and anger appropriately will improve, Ability to demonstrate self-control, Ability to participate in decision making will improve, Ability to verbalize feelings will improve, Ability to disclose and discuss suicidal ideas, Ability to identify and develop effective coping behaviors will improve, and Compliance with prescribed medications will improve  Medication Management: RN will administer medications as ordered by provider, will assess and evaluate patient's response and provide education to patient for prescribed medication. RN will report any adverse and/or side effects to prescribing provider.  Therapeutic Interventions: 1 on 1 counseling sessions, Psychoeducation, Medication administration, Evaluate responses to treatment, Monitor vital signs and CBGs as ordered, Perform/monitor CIWA, COWS, AIMS and Fall Risk screenings as ordered, Perform wound care treatments as ordered.  Evaluation of Outcomes: Progressing   LCSW Treatment Plan for Primary Diagnosis: Bipolar affective disorder ([place]) Long Term Goal(s): Safe transition to appropriate next level of care at discharge, Engage patient in therapeutic group addressing interpersonal concerns.  Short Term Goals: Engage patient in aftercare planning with referrals and resources, Increase social support, Increase ability to appropriately verbalize feelings, Increase emotional regulation, Facilitate acceptance of mental health diagnosis and concerns, and Increase skills for wellness and recovery  Therapeutic Interventions:  Assess for all discharge needs, 1 to 1 time with Social worker, Explore available resources and support systems, Assess for adequacy in community support network, Educate family and significant other(s) on suicide prevention, Complete Psychosocial Assessment, Interpersonal group therapy.  Evaluation of Outcomes: Progressing   Progress in Treatment: Attending groups: Yes. Participating in groups: Yes. Taking medication as prescribed: Yes. Toleration medication: Yes. Family/Significant other contact made: No, will contact:  when given permission.  Patient understands diagnosis: Yes. Discussing patient identified problems/goals with staff: Yes. Medical problems stabilized or resolved: Yes. Denies suicidal/homicidal ideation: Yes. Issues/concerns per patient self-inventory: No. Other: none.  New problem(s) identified: No, Describe:  none identified. 07/05/22 Update: No changes at this time. 07/11/22 Update: No changes at this time. 07/15/22 Update: No changes at this time. 07/20/22 Update: No changes at this time.    New Short Term/Long Term Goal(s): detox, elimination of symptoms of psychosis, medication management for mood stabilization; elimination of SI thoughts; development of comprehensive mental wellness/sobriety plan. 07/05/22 Update: No changes at this time. 07/11/22 Update: No changes at this time. 07/15/22 Update: No changes at this time. 07/20/22 Update: No changes at this time.   Patient Goals:  Pt declined to attend treatment team meeting. 07/05/22 Update: No changes at this time. 07/11/22 Update: No changes at this time. 07/15/22 Update: No changes at this time. 07/20/22 Update: No changes at this time.   Discharge Plan or Barriers: CSW will assist pt with development of an appropriate aftercare/discharge plan. 07/05/22 Update: No changes  at this time. 07/11/22 Update: No changes at this time. 07/15/22 Update: No changes at this time. 07/20/22 Update: No changes at this time.   Reason for Continuation  of Hospitalization: Medication stabilization Other; describe psychosis   Estimated Length of Stay: 1-7 days 07/05/22 Update: No changes at this time. 07/11/22 Update: No changes at this time. 07/15/22 Update: TBD 07/20/22 Update: No changes at this time.  Last 3 [place] Suicide Severity Risk Score: Flowsheet Row Admission (Current) from 06/29/2022 in [place] Most recent reading at 06/29/2022  9:15 PM ED from 06/29/2022 in [place] Most recent reading at 06/29/2022  8:08 AM ED from 06/08/2022 in [place] Most recent reading at 06/08/2022  8:57 PM  C-SSRS RISK CATEGORY No Risk No Risk No Risk       Last PHQ 2/9 Scores:    04/01/2022    1:26 PM  Depression screen PHQ 2/9  Decreased Interest 0  Down, Depressed, Hopeless 2  PHQ - 2 Score 2  Altered sleeping 3  Tired, decreased energy 3  Change in appetite 3  Feeling bad or failure about yourself  3  Trouble concentrating 3  Moving slowly or fidgety/restless 3  Suicidal thoughts 0  PHQ-9 Score 20  Difficult doing work/chores Somewhat difficult    Scribe for Treatment Team: Shirl Harris, LCSW 07/20/2022 9:06 AM

## 2022-07-20 NOTE — Plan of Care (Addendum)
D: Patient alert and oriented. Patient denies pain. Patient denies anxiety and depression. Patient denies SI/HI/AVH. Patient has been isolative to room during shift with exception to coming out for meals and 1600 vitals.  A: Patient did not have scheduled medication during this shift with the exception to scheduled ensures. Q15 minute safety checks maintained.   R: Patient compliant treatment plan.  Patient remains safe on the unit at this time.  Problem: Education: Goal: Knowledge of [facility] Education information/materials will improve Outcome: Progressing Goal: Verbalization of understanding the information provided will improve Outcome: Progressing   Problem: Health Behavior/Discharge Planning: Goal: Identification of resources available to assist in meeting health care needs will improve Outcome: Progressing Goal: Compliance with treatment plan for underlying cause of condition will improve Outcome: Progressing   Problem: Physical Regulation: Goal: Ability to maintain clinical measurements within normal limits will improve Outcome: Progressing

## 2022-07-20 NOTE — Progress Notes (Signed)
Patient is quiet and reserved.  Has been isolative to her room.  Did not come out this evening for snack, but did receive her Ensure with her qhs meds.  Denies si  hi avh depression and anxiety at this encounter.  Has worries related to placement, hoping to hear back soon.  Expressed willingness to go to women's group home if the shelter placement falls through.  Support and encouragement offered.  Reminded her to meet with social work for assistance and follow up on placement. Will continue to monitor with q 15 minute safety checks.    C Butler-Nicholson, LPN

## 2022-07-20 NOTE — Progress Notes (Signed)
[facility] MD Progress Note  07/20/2022 4:43 PM Autumn [name]  MRN:  [id number] Subjective: Follow-up 46 year old woman with bipolar disorder.  Patient says she is feeling better.  Eating better.  More optimistic.  Denies psychotic symptoms Principal Problem: Bipolar affective disorder ([place]) Diagnosis: Principal Problem:   Bipolar affective disorder ([place])  Total Time spent with patient: 30 minutes  Past Psychiatric History: Past history of bipolar disorder and substance use problems  Past Medical History:  Past Medical History:  Diagnosis Date   Allergy    Seasonal, Ultram (seizures)   Anemia 2005   Bipolar 1 disorder ([place])    Glaucoma    Seizures ([place]) 2008    Past Surgical History:  Procedure Laterality Date   BREAST EXCISIONAL BIOPSY Right    x 2   CESAREAN SECTION     x 2   LEEP  2000   Family History:  Family History  Problem Relation Age of Onset   Hypertension Mother    Hyperlipidemia Mother    Asthma Mother    Parkinson's disease Father    Lung cancer Maternal Grandmother    Other Paternal Grandfather 65       Cardiac arrest   Heart disease Paternal Grandfather    Tourette syndrome Daughter    Family Psychiatric  History: See previous Social History:  Social History   Substance and Sexual Activity  Alcohol Use Not Currently   Alcohol/week: 8.0 standard drinks of alcohol   Types: 8 Shots of liquor per week   Comment: last use 03/30/22 4x/yr     Social History   Substance and Sexual Activity  Drug Use Yes   Frequency: 1.0 times per week   Types: Marijuana, Cocaine, Heroin, Methamphetamines, MDMA (Ecstacy)   Comment: last use yesterday    Social History   Socioeconomic History   Marital status: Legally Separated    Spouse name: Not on file   Number of children: Not on file   Years of education: Not on file   Highest education level: Not on file  Occupational History   Not on file  Tobacco Use   Smoking status: Every Day    Packs/day: 0.50     Years: 9.00    Total pack years: 4.50    Types: E-cigarettes, Cigarettes, Cigars   Smokeless tobacco: Former    Types: Nurse, children's Use: Some days   Substances: Nicotine  Substance and Sexual Activity   Alcohol use: Not Currently    Alcohol/week: 8.0 standard drinks of alcohol    Types: 8 Shots of liquor per week    Comment: last use 03/30/22 4x/yr   Drug use: Yes    Frequency: 1.0 times per week    Types: Marijuana, Cocaine, Heroin, Methamphetamines, MDMA (Ecstacy)    Comment: last use yesterday   Sexual activity: Yes    Partners: Male    Birth control/protection: None  Other Topics Concern   Not on file  Social History Narrative   Not on file   Social Determinants of Health   Financial Resource Strain: Not on file  Food Insecurity: Not on file  Transportation Needs: Not on file  Physical Activity: Not on file  Stress: Not on file  Social Connections: Not on file   Additional Social History:                         Sleep: Fair  Appetite:  Fair  Current Medications: Current  Facility-Administered Medications  Medication Dose Route Frequency Provider Last Rate Last Admin   acetaminophen (TYLENOL) tablet 650 mg  650 mg Oral Q6H PRN Waldon Merl F, NP   650 mg at 07/11/22 2107   alum & mag hydroxide-simeth (MAALOX/MYLANTA) 200-200-20 MG/5ML suspension 30 mL  30 mL Oral Q4H PRN Waldon Merl F, NP       ARIPiprazole (ABILIFY) tablet 15 mg  15 mg Oral QHS Sandria Mcenroe T, MD   15 mg at 07/19/22 2118   divalproex (DEPAKOTE) DR tablet 1,250 mg  1,250 mg Oral QHS Tametra Ahart T, MD   1,250 mg at 07/19/22 2118   feeding supplement (ENSURE ENLIVE / ENSURE PLUS) liquid 237 mL  237 mL Oral BID BM Gilmer Kaminsky T, MD   237 mL at 07/20/22 1436   hydrOXYzine (ATARAX) tablet 25 mg  25 mg Oral TID PRN Sherlon Handing, NP   25 mg at 07/18/22 2059   lithium carbonate (ESKALITH) CR tablet 900 mg  900 mg Oral QHS Nabiha Planck T, MD   900 mg at 07/19/22  2118   magnesium hydroxide (MILK OF MAGNESIA) suspension 30 mL  30 mL Oral Daily PRN Waldon Merl F, NP       nicotine polacrilex (NICORETTE) gum 2 mg  2 mg Oral PRN Braxton Weisbecker, [name], MD   2 mg at 07/19/22 1202   QUEtiapine (SEROQUEL) tablet 300 mg  300 mg Oral QHS Parks Ranger, DO   300 mg at 07/19/22 2118   senna-docusate (Senokot-S) tablet 2 tablet  2 tablet Oral Daily PRN Kenshawn Maciolek, [name], MD        Lab Results: No results found for this or any previous visit (from the past 48 hour(s)).  Blood Alcohol level:  Lab Results  Component Value Date   ETH <10 06/29/2022   ETH <10 80/99/8338    Metabolic Disorder Labs: Lab Results  Component Value Date   HGBA1C 4.9 12/25/2021   MPG 93.93 12/25/2021   MPG 114 06/02/2021   No results found for: "PROLACTIN" Lab Results  Component Value Date   CHOL 160 12/25/2021   TRIG 95 12/25/2021   HDL 58 12/25/2021   CHOLHDL 2.8 12/25/2021   VLDL 19 12/25/2021   LDLCALC 83 12/25/2021   LDLCALC 63 06/01/2021    Physical Findings: AIMS: Facial and Oral Movements Muscles of Facial Expression: None, normal Lips and Perioral Area: None, normal Jaw: None, normal Tongue: None, normal,Extremity Movements Upper (arms, wrists, hands, fingers): None, normal Lower (legs, knees, ankles, toes): None, normal, Trunk Movements Neck, shoulders, hips: None, normal, Overall Severity Severity of abnormal movements (highest score from questions above): None, normal Incapacitation due to abnormal movements: None, normal Patient's awareness of abnormal movements (rate only patient's report): No Awareness, Dental Status Current problems with teeth and/or dentures?: No Does patient usually wear dentures?: No  CIWA:    COWS:     Musculoskeletal: Strength & Muscle Tone: within normal limits Gait & Station: normal Patient leans: N/A  Psychiatric Specialty Exam:  Presentation  General Appearance: Casual; Neat  Eye  Contact:Fair  Speech:Slow  Speech Volume:Decreased  Handedness:Right   Mood and Affect  Mood:Depressed  Affect:Flat   Thought Process  Thought Processes:Coherent; Goal Directed  Descriptions of Associations:Circumstantial  Orientation:Full (Time, Place and Person)  Thought Content:Logical  History of Schizophrenia/Schizoaffective disorder:Yes  Duration of Psychotic Symptoms:Greater than six months  Hallucinations:No data recorded Ideas of Reference:None  Suicidal Thoughts:No data recorded Homicidal Thoughts:No data recorded  Sensorium  Memory:Immediate Fair; Immediate Good  Judgment:Fair  Insight:Fair   Executive Functions  Concentration:Fair  Attention Span:Fair  [place]   Psychomotor Activity  Psychomotor Activity:No data recorded  Assets  Assets:Communication Skills; Desire for Improvement   Sleep  Sleep:No data recorded   Physical Exam: Physical Exam Vitals and nursing note reviewed.  Constitutional:      Appearance: Normal appearance.  HENT:     Head: Normocephalic and atraumatic.     Mouth/Throat:     Pharynx: Oropharynx is clear.  Eyes:     Pupils: Pupils are equal, round, and reactive to light.  Cardiovascular:     Rate and Rhythm: Normal rate and regular rhythm.  Pulmonary:     Effort: Pulmonary effort is normal.     Breath sounds: Normal breath sounds.  Abdominal:     General: Abdomen is flat.     Palpations: Abdomen is soft.  Musculoskeletal:        General: Normal range of motion.  Skin:    General: Skin is warm and dry.  Neurological:     General: No focal deficit present.     Mental Status: She is alert. Mental status is at baseline.  Psychiatric:        Mood and Affect: Mood normal.        Thought Content: Thought content normal.    Review of Systems  Constitutional: Negative.   HENT: Negative.    Eyes: Negative.   Respiratory: Negative.    Cardiovascular:  Negative.   Gastrointestinal: Negative.   Musculoskeletal: Negative.   Skin: Negative.   Neurological: Negative.   Psychiatric/Behavioral: Negative.     Blood pressure (!) 82/67, pulse 63, temperature 97.6 F (36.4 C), temperature source Oral, resp. rate 16, height '5\' 1"'$  (1.549 m), weight 45 kg, last menstrual period 05/17/2022, SpO2 100 %. Body mass index is 18.74 kg/m.   Treatment Plan Summary: Medication management and Plan no change to medication management.  Encourage group attendance and encourage interaction with staff.  Hope we will be finding some kind of discharge plan soon if she gets word back from [facility] shelter.  Alethia Berthold, MD 07/20/2022, 4:43 PM

## 2022-07-21 DIAGNOSIS — F3164 Bipolar disorder, current episode mixed, severe, with psychotic features: Secondary | ICD-10-CM | POA: Diagnosis not present

## 2022-07-21 IMAGING — CR DG CHEST 2V
1 series · 2 of 2 positions shown · non-contrast
Comparison: March 30, 2021

CLINICAL DATA: Productive cough with green sputum.

EXAM:
CHEST - 2 VIEW

[Series 1: dg chest 2 view · 0.14mm/px · 2 of 2 slices shown]
[im 1/2]
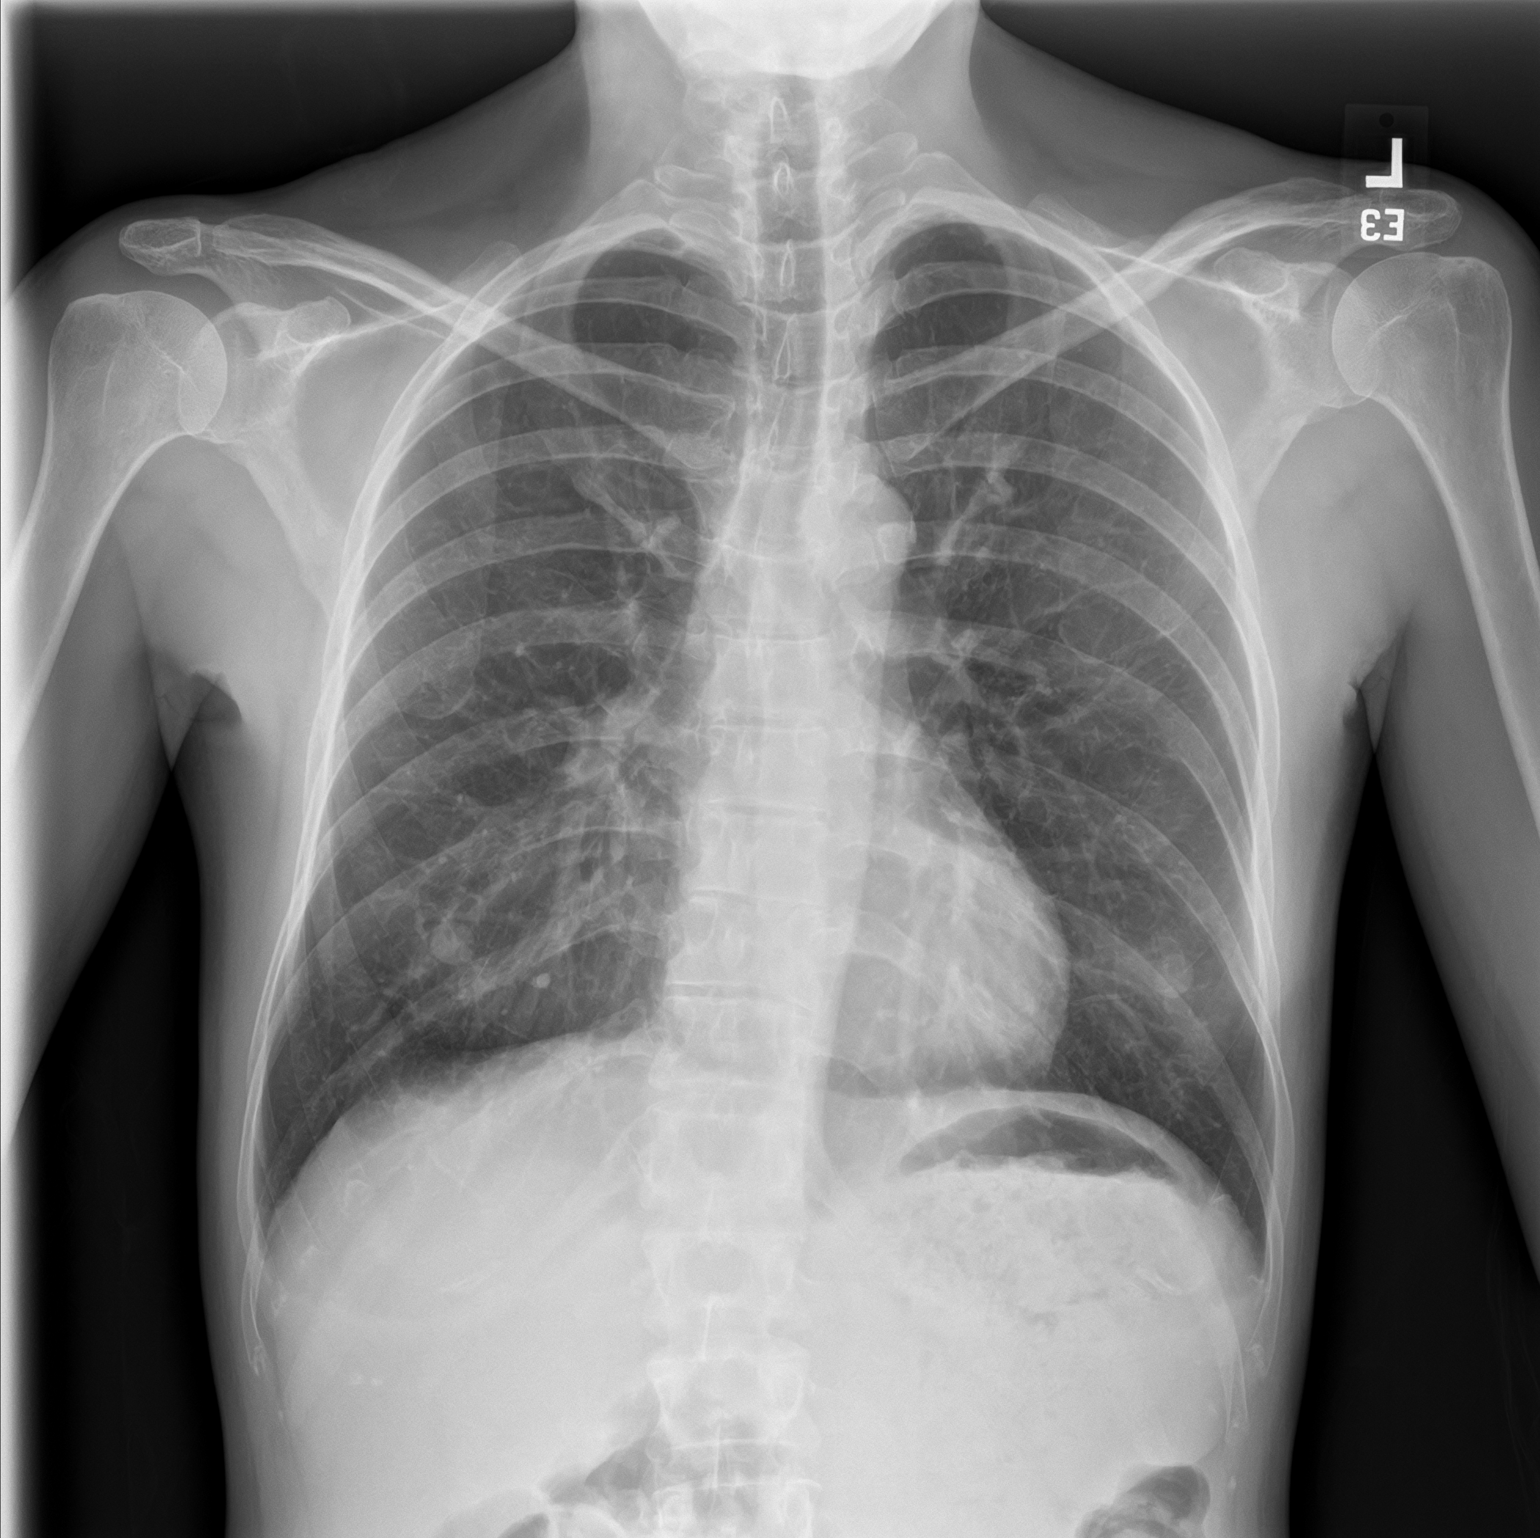
[im 2/2]
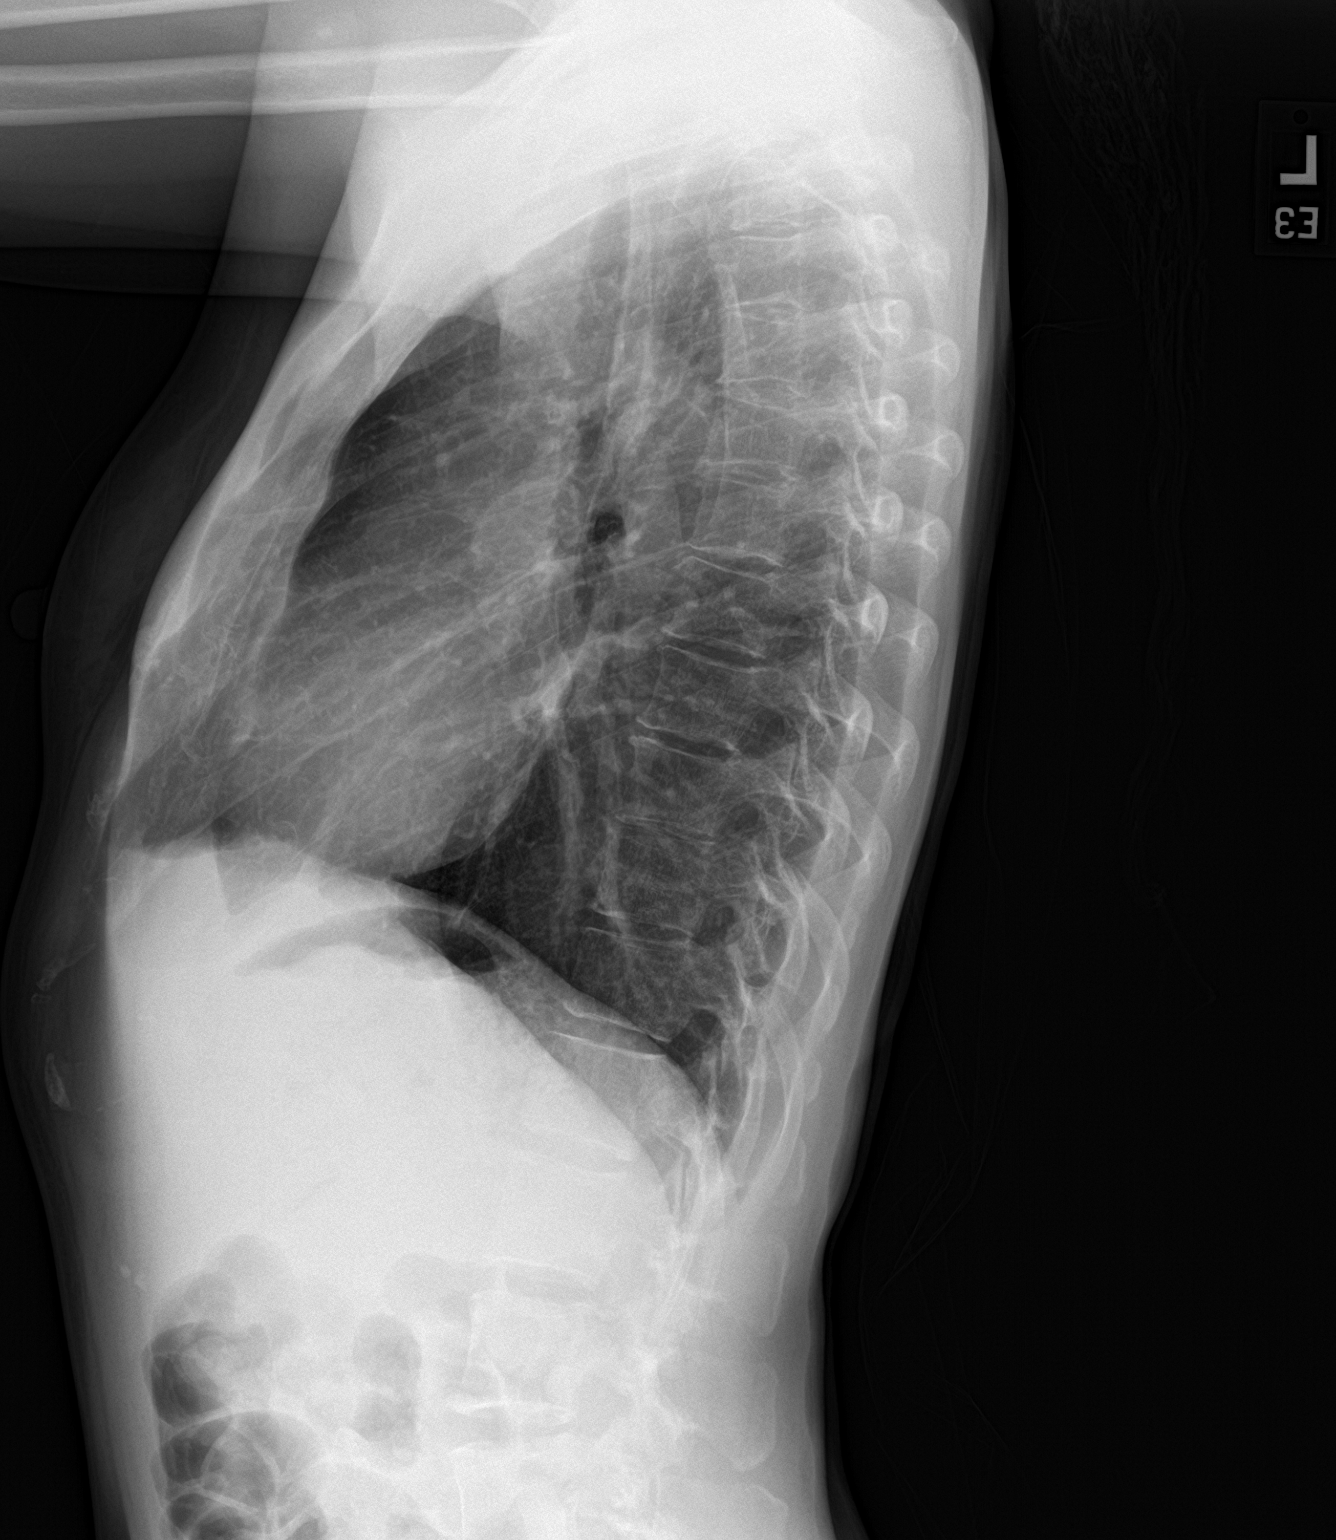

[2 of 2 positions shown; findings below may reference images not displayed]

FINDINGS: The heart size and mediastinal contours are within normal limits. No
focal consolidation. No pleural effusion. No pneumothorax.
Bilateral nipple shadows. The visualized skeletal structures are
unremarkable.
IMPRESSION: No acute cardiopulmonary findings.

## 2022-07-21 NOTE — Progress Notes (Signed)
Recreation Therapy Notes    Date: 07/21/2022   Time: 10:30 am   Location: Craft room    Behavioral response: N/A   Intervention Topic: Stress Management    Discussion/Intervention: Patient refused to attend group.   Clinical Observations/Feedback:  Patient refused to attend group.   Danijela Vessey LRT/CTRS          Shamarion Coots 07/21/2022 11:13 AM

## 2022-07-21 NOTE — Progress Notes (Signed)
[facility] MD Progress Note  07/21/2022 2:36 PM Autumn [name]  MRN:  [id number] Subjective: Patient seen for follow-up.  46 year old woman with bipolar disorder.  Denies acute symptoms.  Denies depression denies suicidal ideation denies psychotic symptoms.  Getting up out of bed a little more.  Attending groups more regularly.  Trying to follow up on discharge planning but her options are very limited.  No new physical complaints. Principal Problem: Bipolar affective disorder ([place]) Diagnosis: Principal Problem:   Bipolar affective disorder ([place])  Total Time spent with patient: 30 minutes  Past Psychiatric History: Long history of chronic mental illness multiple prior admissions long history of substance use problems.  Past Medical History:  Past Medical History:  Diagnosis Date   Allergy    Seasonal, Ultram (seizures)   Anemia 2005   Bipolar 1 disorder ([facility])    Glaucoma    Seizures ([place]) 2008    Past Surgical History:  Procedure Laterality Date   BREAST EXCISIONAL BIOPSY Right    x 2   CESAREAN SECTION     x 2   LEEP  2000   Family History:  Family History  Problem Relation Age of Onset   Hypertension Mother    Hyperlipidemia Mother    Asthma Mother    Parkinson's disease Father    Lung cancer Maternal Grandmother    Other Paternal Grandfather 41       Cardiac arrest   Heart disease Paternal Grandfather    Tourette syndrome Daughter    Family Psychiatric  History: See previous Social History:  Social History   Substance and Sexual Activity  Alcohol Use Not Currently   Alcohol/week: 8.0 standard drinks of alcohol   Types: 8 Shots of liquor per week   Comment: last use 03/30/22 4x/yr     Social History   Substance and Sexual Activity  Drug Use Yes   Frequency: 1.0 times per week   Types: Marijuana, Cocaine, Heroin, Methamphetamines, MDMA (Ecstacy)   Comment: last use yesterday    Social History   Socioeconomic History   Marital status: Legally Separated     Spouse name: Not on file   Number of children: Not on file   Years of education: Not on file   Highest education level: Not on file  Occupational History   Not on file  Tobacco Use   Smoking status: Every Day    Packs/day: 0.50    Years: 9.00    Total pack years: 4.50    Types: E-cigarettes, Cigarettes, Cigars   Smokeless tobacco: Former    Types: Nurse, children's Use: Some days   Substances: Nicotine  Substance and Sexual Activity   Alcohol use: Not Currently    Alcohol/week: 8.0 standard drinks of alcohol    Types: 8 Shots of liquor per week    Comment: last use 03/30/22 4x/yr   Drug use: Yes    Frequency: 1.0 times per week    Types: Marijuana, Cocaine, Heroin, Methamphetamines, MDMA (Ecstacy)    Comment: last use yesterday   Sexual activity: Yes    Partners: Male    Birth control/protection: None  Other Topics Concern   Not on file  Social History Narrative   Not on file   Social Determinants of Health   Financial Resource Strain: Not on file  Food Insecurity: Not on file  Transportation Needs: Not on file  Physical Activity: Not on file  Stress: Not on file  Social Connections: Not on file  Additional Social History:                         Sleep: Fair  Appetite:  Fair  Current Medications: Current Facility-Administered Medications  Medication Dose Route Frequency Provider Last Rate Last Admin   acetaminophen (TYLENOL) tablet 650 mg  650 mg Oral Q6H PRN Sherlon Handing, NP   650 mg at 07/11/22 2107   alum & mag hydroxide-simeth (MAALOX/MYLANTA) 200-200-20 MG/5ML suspension 30 mL  30 mL Oral Q4H PRN Waldon Merl F, NP       ARIPiprazole (ABILIFY) tablet 15 mg  15 mg Oral QHS Melika Reder T, MD   15 mg at 07/20/22 2112   divalproex (DEPAKOTE) DR tablet 1,250 mg  1,250 mg Oral QHS Everlena Mackley T, MD   1,250 mg at 07/20/22 2112   feeding supplement (ENSURE ENLIVE / ENSURE PLUS) liquid 237 mL  237 mL Oral BID BM Tashina Credit T, MD    237 mL at 07/21/22 1003   hydrOXYzine (ATARAX) tablet 25 mg  25 mg Oral TID PRN Sherlon Handing, NP   25 mg at 07/18/22 2059   lithium carbonate (ESKALITH) CR tablet 900 mg  900 mg Oral QHS Geraldina Parrott T, MD   900 mg at 07/20/22 2112   magnesium hydroxide (MILK OF MAGNESIA) suspension 30 mL  30 mL Oral Daily PRN Waldon Merl F, NP       nicotine polacrilex (NICORETTE) gum 2 mg  2 mg Oral PRN Aubriegh Minch, [name], MD   2 mg at 07/19/22 1202   QUEtiapine (SEROQUEL) tablet 300 mg  300 mg Oral QHS Parks Ranger, DO   300 mg at 07/20/22 2112   senna-docusate (Senokot-S) tablet 2 tablet  2 tablet Oral Daily PRN Teara Duerksen, [name], MD        Lab Results: No results found for this or any previous visit (from the past 48 hour(s)).  Blood Alcohol level:  Lab Results  Component Value Date   ETH <10 06/29/2022   ETH <10 07/37/1062    Metabolic Disorder Labs: Lab Results  Component Value Date   HGBA1C 4.9 12/25/2021   MPG 93.93 12/25/2021   MPG 114 06/02/2021   No results found for: "PROLACTIN" Lab Results  Component Value Date   CHOL 160 12/25/2021   TRIG 95 12/25/2021   HDL 58 12/25/2021   CHOLHDL 2.8 12/25/2021   VLDL 19 12/25/2021   LDLCALC 83 12/25/2021   LDLCALC 63 06/01/2021    Physical Findings: AIMS: Facial and Oral Movements Muscles of Facial Expression: None, normal Lips and Perioral Area: None, normal Jaw: None, normal Tongue: None, normal,Extremity Movements Upper (arms, wrists, hands, fingers): None, normal Lower (legs, knees, ankles, toes): None, normal, Trunk Movements Neck, shoulders, hips: None, normal, Overall Severity Severity of abnormal movements (highest score from questions above): None, normal Incapacitation due to abnormal movements: None, normal Patient's awareness of abnormal movements (rate only patient's report): No Awareness, Dental Status Current problems with teeth and/or dentures?: No Does patient usually wear dentures?: No  CIWA:     COWS:     Musculoskeletal: Strength & Muscle Tone: within normal limits Gait & Station: normal Patient leans: N/A  Psychiatric Specialty Exam:  Presentation  General Appearance: Casual; Neat  Eye Contact:Fair  Speech:Slow  Speech Volume:Decreased  Handedness:Right   Mood and Affect  Mood:Depressed  Affect:Flat   Thought Process  Thought Processes:Coherent; Goal Directed  Descriptions of Associations:Circumstantial  Orientation:Full (Time,  Place and Person)  Thought Content:Logical  History of Schizophrenia/Schizoaffective disorder:Yes  Duration of Psychotic Symptoms:Greater than six months  Hallucinations:No data recorded Ideas of Reference:None  Suicidal Thoughts:No data recorded Homicidal Thoughts:No data recorded  Sensorium  Memory:Immediate Fair; Immediate Good  Judgment:Fair  Insight:Fair   Executive Functions  Concentration:Fair  Attention Span:Fair  [place]   Psychomotor Activity  Psychomotor Activity:No data recorded  Assets  Assets:Communication Skills; Desire for Improvement   Sleep  Sleep:No data recorded   Physical Exam: Physical Exam Vitals and nursing note reviewed.  Constitutional:      Appearance: Normal appearance.  HENT:     Head: Normocephalic and atraumatic.     Mouth/Throat:     Pharynx: Oropharynx is clear.  Eyes:     Pupils: Pupils are equal, round, and reactive to light.  Cardiovascular:     Rate and Rhythm: Normal rate and regular rhythm.  Pulmonary:     Effort: Pulmonary effort is normal.     Breath sounds: Normal breath sounds.  Abdominal:     General: Abdomen is flat.     Palpations: Abdomen is soft.  Musculoskeletal:        General: Normal range of motion.  Skin:    General: Skin is warm and dry.  Neurological:     General: No focal deficit present.     Mental Status: She is alert. Mental status is at baseline.  Psychiatric:        Attention  and Perception: Attention normal.        Mood and Affect: Mood normal.        Speech: Speech normal.        Behavior: Behavior normal.        Thought Content: Thought content normal.        Cognition and Memory: Cognition normal.        Judgment: Judgment normal.    Review of Systems  Constitutional: Negative.   HENT: Negative.    Eyes: Negative.   Respiratory: Negative.    Cardiovascular: Negative.   Gastrointestinal: Negative.   Musculoskeletal: Negative.   Skin: Negative.   Neurological: Negative.   Psychiatric/Behavioral: Negative.     Blood pressure (!) 84/55, pulse 62, temperature 97.8 F (36.6 C), temperature source Oral, resp. rate 16, height '5\' 1"'$  (1.549 m), weight 45 kg, last menstrual period 05/17/2022, SpO2 100 %. Body mass index is 18.74 kg/m.   Treatment Plan Summary: Medication management and Plan patient appears stable on current medication.  Not over sedated.  Starting to look more upbeat and affect is appropriate.  We are in a difficult situation with finding any kind of placement for her.  She is working with social work and the treatment team on options.  Alethia Berthold, MD 07/21/2022, 2:36 PM

## 2022-07-21 NOTE — Progress Notes (Addendum)
Pt denies SI/HI/AVH and verbally agrees to approach staff if these become apparent or before harming themselves/others. Rates depression 0/10. Rates anxiety 0/10. Rates pain 0/10.  Pt has been getting up for meals and has been going to groups. Pt was hanging out in the dayroom also at times. Pt has been in her room for the rest of the day. Pt stated she wanted to find more placement options. Scheduled medications administered to pt, per MD orders. RN provided support and encouragement to pt. Q15 min safety checks implemented and continued. Pt safe on the unit. RN will continue to monitor and intervene as needed.  Problem: Health Behavior/Discharge Planning: Goal: Ability to manage health-related needs will improve Outcome: Progressing   Problem: Nutrition: Goal: Adequate nutrition will be maintained Outcome: Progressing      07/21/22 1230  Psych Admission Type (Psych Patients Only)  Admission Status Involuntary  Psychosocial Assessment  Patient Complaints None  Eye Contact Fair  Facial Expression Other (Comment) (WDL)  Affect Appropriate to circumstance  Speech Logical/coherent  Interaction Assertive  Motor Activity Other (Comment) (WDL)  Appearance/Hygiene In scrubs;Unremarkable  Behavior Characteristics Cooperative;Appropriate to situation;Calm  Mood Pleasant  Thought Process  Coherency WDL  Content WDL  Delusions None reported or observed  Perception WDL  Hallucination None reported or observed  Judgment WDL  Confusion None  Danger to Self  Current suicidal ideation? Denies  Danger to Others  Danger to Others None reported or observed  Danger to Others Abnormal  Harmful Behavior to others No threats or harm toward other people  Destructive Behavior No threats or harm toward property

## 2022-07-21 NOTE — Plan of Care (Signed)
  Problem: Health Behavior/Discharge Planning: Goal: Ability to manage health-related needs will improve Outcome: Progressing   Problem: Clinical Measurements: Goal: Ability to maintain clinical measurements within normal limits will improve Outcome: Progressing Goal: Will remain free from infection Outcome: Progressing Goal: Diagnostic test results will improve Outcome: Progressing Goal: Respiratory complications will improve Outcome: Progressing Goal: Cardiovascular complication will be avoided Outcome: Progressing   Problem: Safety: Goal: Periods of time without injury will increase Outcome: Progressing   Problem: Physical Regulation: Goal: Ability to maintain clinical measurements within normal limits will improve Outcome: Progressing   Problem: Health Behavior/Discharge Planning: Goal: Identification of resources available to assist in meeting health care needs will improve Outcome: Progressing Goal: Compliance with treatment plan for underlying cause of condition will improve Outcome: Progressing   Problem: Coping: Goal: Ability to verbalize frustrations and anger appropriately will improve Outcome: Progressing Goal: Ability to demonstrate self-control will improve Outcome: Progressing

## 2022-07-21 NOTE — Group Note (Signed)
[place] LCSW Group Therapy Note   Group Date: 07/21/2022 Start Time: 1330 End Time: 1430   Type of Therapy/Topic:  Group Therapy:  Emotion Regulation  Participation Level:  Did Not Attend    Description of Group:    The purpose of this group is to assist patients in learning to regulate negative emotions and experience positive emotions. Patients will be guided to discuss ways in which they have been vulnerable to their negative emotions. These vulnerabilities will be juxtaposed with experiences of positive emotions or situations, and patients challenged to use positive emotions to combat negative ones. Special emphasis will be placed on coping with negative emotions in conflict situations, and patients will process healthy conflict resolution skills.  Therapeutic Goals: Patient will identify two positive emotions or experiences to reflect on in order to balance out negative emotions:  Patient will label two or more emotions that they find the most difficult to experience:  Patient will be able to demonstrate positive conflict resolution skills through discussion or role plays:   Summary of Patient Progress: X   Therapeutic Modalities:   Cognitive Behavioral Therapy Feelings Identification Dialectical Behavioral Therapy   Shirl Harris, LCSW

## 2022-07-22 DIAGNOSIS — F3164 Bipolar disorder, current episode mixed, severe, with psychotic features: Secondary | ICD-10-CM | POA: Diagnosis not present

## 2022-07-22 LAB — SARS CORONAVIRUS 2 BY RT PCR: SARS Coronavirus 2 by RT PCR: NEGATIVE

## 2022-07-22 NOTE — Progress Notes (Signed)
Recreation Therapy Notes   Date: 07/22/2022  Time: 10:10 am   Location: Courtyard     Behavioral response: N/A   Intervention Topic: Leisure   Discussion/Intervention: Patient refused to attend group.   Clinical Observations/Feedback:  Patient refused to attend group.   Mariam Helbert LRT/CTRS         Marybel Alcott 07/22/2022 12:11 PM

## 2022-07-22 NOTE — Plan of Care (Signed)
  Problem: Education: Goal: Knowledge of General Education information will improve Description: Including pain rating scale, medication(s)/side effects and non-pharmacologic comfort measures Outcome: Progressing   Problem: Health Behavior/Discharge Planning: Goal: Ability to manage health-related needs will improve Outcome: Progressing   Problem: Clinical Measurements: Goal: Ability to maintain clinical measurements within normal limits will improve Outcome: Progressing Goal: Will remain free from infection Outcome: Progressing Goal: Diagnostic test results will improve Outcome: Progressing Goal: Respiratory complications will improve Outcome: Progressing Goal: Cardiovascular complication will be avoided Outcome: Progressing   Problem: Safety: Goal: Periods of time without injury will increase Outcome: Progressing   Problem: Physical Regulation: Goal: Ability to maintain clinical measurements within normal limits will improve Outcome: Progressing   Problem: Health Behavior/Discharge Planning: Goal: Identification of resources available to assist in meeting health care needs will improve Outcome: Progressing Goal: Compliance with treatment plan for underlying cause of condition will improve Outcome: Progressing   Problem: Coping: Goal: Ability to verbalize frustrations and anger appropriately will improve Outcome: Progressing Goal: Ability to demonstrate self-control will improve Outcome: Progressing   

## 2022-07-22 NOTE — Progress Notes (Signed)
Pt denies SI/HI/AVH and verbally agrees to approach staff if these become apparent or before harming themselves/others. Rates depression 0/10. Rates anxiety 0/10. Rates pain 0/10. Pt reported to night shift that she was not feeling well. RN observed this morning that pt has a runny nose. Pt had a covid test done and came back negative. Pt has been in her bed all day long. Scheduled medications administered to pt, per MD orders. RN provided support and encouragement to pt. Q15 min safety checks implemented and continued. Pt safe on the unit. RN will continue to monitor and intervene as needed.  Problem: Education: Goal: Knowledge of General Education information will improve Description: Including pain rating scale, medication(s)/side effects and non-pharmacologic comfort measures Outcome: Progressing   Problem: Health Behavior/Discharge Planning: Goal: Ability to manage health-related needs will improve Outcome: Progressing    07/22/22 0800  Psych Admission Type (Psych Patients Only)  Admission Status Involuntary  Psychosocial Assessment  Patient Complaints None  Eye Contact Fair  Facial Expression Flat;Sad  Affect Sad  Speech Logical/coherent  Interaction Assertive  Motor Activity Other (Comment) (WDL)  Appearance/Hygiene In scrubs;Unremarkable  Behavior Characteristics Cooperative;Appropriate to situation;Calm  Mood Sad;Pleasant  Thought Process  Coherency WDL  Content WDL  Delusions None reported or observed  Perception WDL  Hallucination None reported or observed  Judgment WDL  Confusion None  Danger to Self  Current suicidal ideation? Denies  Danger to Others  Danger to Others None reported or observed  Danger to Others Abnormal  Harmful Behavior to others No threats or harm toward other people  Destructive Behavior No threats or harm toward property

## 2022-07-22 NOTE — [facility] Group Notes (Signed)
[facility] Group Notes:  (Nursing/MHT/Case Management/Adjunct)  Date:  07/22/2022  Time:  4:20 AM  Type of Therapy:   Wrap up  Participation Level:  Did Not Attend  Summary of Progress/Problems:  Autumn [name] 07/22/2022, 4:20 AM

## 2022-07-22 NOTE — Progress Notes (Signed)
[facility] MD Progress Note  07/22/2022 3:14 PM Autumn [name]  MRN:  [id number] Subjective: Follow-up patient with bipolar disorder.  Mood continues to improve.  No depression and no psychosis.  More active and alert.  Better self-care.  Making good eye contact.  Had transient physical complaints but appears to be COVID-negative. Principal Problem: Bipolar affective disorder ([place]) Diagnosis: Principal Problem:   Bipolar affective disorder ([place])  Total Time spent with patient: 20 minutes  Past Psychiatric History: Past history of bipolar disorder  Past Medical History:  Past Medical History:  Diagnosis Date   Allergy    Seasonal, Ultram (seizures)   Anemia 2005   Bipolar 1 disorder ([place])    Glaucoma    Seizures ([place]) 2008    Past Surgical History:  Procedure Laterality Date   BREAST EXCISIONAL BIOPSY Right    x 2   CESAREAN SECTION     x 2   LEEP  2000   Family History:  Family History  Problem Relation Age of Onset   Hypertension Mother    Hyperlipidemia Mother    Asthma Mother    Parkinson's disease Father    Lung cancer Maternal Grandmother    Other Paternal Grandfather 71       Cardiac arrest   Heart disease Paternal Grandfather    Tourette syndrome Daughter    Family Psychiatric  History: See previous Social History:  Social History   Substance and Sexual Activity  Alcohol Use Not Currently   Alcohol/week: 8.0 standard drinks of alcohol   Types: 8 Shots of liquor per week   Comment: last use 03/30/22 4x/yr     Social History   Substance and Sexual Activity  Drug Use Yes   Frequency: 1.0 times per week   Types: Marijuana, Cocaine, Heroin, Methamphetamines, MDMA (Ecstacy)   Comment: last use yesterday    Social History   Socioeconomic History   Marital status: Legally Separated    Spouse name: Not on file   Number of children: Not on file   Years of education: Not on file   Highest education level: Not on file  Occupational History   Not on file   Tobacco Use   Smoking status: Every Day    Packs/day: 0.50    Years: 9.00    Total pack years: 4.50    Types: E-cigarettes, Cigarettes, Cigars   Smokeless tobacco: Former    Types: Nurse, children's Use: Some days   Substances: Nicotine  Substance and Sexual Activity   Alcohol use: Not Currently    Alcohol/week: 8.0 standard drinks of alcohol    Types: 8 Shots of liquor per week    Comment: last use 03/30/22 4x/yr   Drug use: Yes    Frequency: 1.0 times per week    Types: Marijuana, Cocaine, Heroin, Methamphetamines, MDMA (Ecstacy)    Comment: last use yesterday   Sexual activity: Yes    Partners: Male    Birth control/protection: None  Other Topics Concern   Not on file  Social History Narrative   Not on file   Social Determinants of Health   Financial Resource Strain: Not on file  Food Insecurity: Not on file  Transportation Needs: Not on file  Physical Activity: Not on file  Stress: Not on file  Social Connections: Not on file   Additional Social History:  Sleep: Fair  Appetite:  Fair  Current Medications: Current Facility-Administered Medications  Medication Dose Route Frequency Provider Last Rate Last Admin   acetaminophen (TYLENOL) tablet 650 mg  650 mg Oral Q6H PRN Sherlon Handing, NP   650 mg at 07/11/22 2107   alum & mag hydroxide-simeth (MAALOX/MYLANTA) 200-200-20 MG/5ML suspension 30 mL  30 mL Oral Q4H PRN Waldon Merl F, NP       ARIPiprazole (ABILIFY) tablet 15 mg  15 mg Oral QHS Brittney Mucha T, MD   15 mg at 07/21/22 2145   divalproex (DEPAKOTE) DR tablet 1,250 mg  1,250 mg Oral QHS Darrel Baroni T, MD   1,250 mg at 07/21/22 2145   feeding supplement (ENSURE ENLIVE / ENSURE PLUS) liquid 237 mL  237 mL Oral BID BM Nas Wafer T, MD   237 mL at 07/22/22 1336   hydrOXYzine (ATARAX) tablet 25 mg  25 mg Oral TID PRN Sherlon Handing, NP   25 mg at 07/18/22 2059   lithium carbonate (ESKALITH) CR tablet  900 mg  900 mg Oral QHS Shakena Callari T, MD   900 mg at 07/21/22 2146   magnesium hydroxide (MILK OF MAGNESIA) suspension 30 mL  30 mL Oral Daily PRN Waldon Merl F, NP       nicotine polacrilex (NICORETTE) gum 2 mg  2 mg Oral PRN Aloria Looper, [name], MD   2 mg at 07/19/22 1202   QUEtiapine (SEROQUEL) tablet 300 mg  300 mg Oral QHS Parks Ranger, DO   300 mg at 07/21/22 2146   senna-docusate (Senokot-S) tablet 2 tablet  2 tablet Oral Daily PRN Kaedyn Polivka, [name], MD        Lab Results: No results found for this or any previous visit (from the past 48 hour(s)).  Blood Alcohol level:  Lab Results  Component Value Date   ETH <10 06/29/2022   ETH <10 68/34/1962    Metabolic Disorder Labs: Lab Results  Component Value Date   HGBA1C 4.9 12/25/2021   MPG 93.93 12/25/2021   MPG 114 06/02/2021   No results found for: "PROLACTIN" Lab Results  Component Value Date   CHOL 160 12/25/2021   TRIG 95 12/25/2021   HDL 58 12/25/2021   CHOLHDL 2.8 12/25/2021   VLDL 19 12/25/2021   LDLCALC 83 12/25/2021   LDLCALC 63 06/01/2021    Physical Findings: AIMS: Facial and Oral Movements Muscles of Facial Expression: None, normal Lips and Perioral Area: None, normal Jaw: None, normal Tongue: None, normal,Extremity Movements Upper (arms, wrists, hands, fingers): None, normal Lower (legs, knees, ankles, toes): None, normal, Trunk Movements Neck, shoulders, hips: None, normal, Overall Severity Severity of abnormal movements (highest score from questions above): None, normal Incapacitation due to abnormal movements: None, normal Patient's awareness of abnormal movements (rate only patient's report): No Awareness, Dental Status Current problems with teeth and/or dentures?: No Does patient usually wear dentures?: No  CIWA:    COWS:     Musculoskeletal: Strength & Muscle Tone: within normal limits Gait & Station: normal Patient leans: N/A  Psychiatric Specialty Exam:  Presentation   General Appearance: Casual; Neat  Eye Contact:Fair  Speech:Slow  Speech Volume:Decreased  Handedness:Right   Mood and Affect  Mood:Depressed  Affect:Flat   Thought Process  Thought Processes:Coherent; Goal Directed  Descriptions of Associations:Circumstantial  Orientation:Full (Time, Place and Person)  Thought Content:Logical  History of Schizophrenia/Schizoaffective disorder:Yes  Duration of Psychotic Symptoms:Greater than six months  Hallucinations:No data recorded Ideas of Reference:None  Suicidal Thoughts:No data recorded Homicidal Thoughts:No data recorded  Sensorium  Memory:Immediate Fair; Immediate Good  Judgment:Fair  Insight:Fair   Executive Functions  Concentration:Fair  Attention Span:Fair  [place]   Psychomotor Activity  Psychomotor Activity:No data recorded  Assets  Assets:Communication Skills; Desire for Improvement   Sleep  Sleep:No data recorded   Physical Exam: Physical Exam Vitals and nursing note reviewed.  Constitutional:      Appearance: Normal appearance.  HENT:     Head: Normocephalic and atraumatic.     Mouth/Throat:     Pharynx: Oropharynx is clear.  Eyes:     Pupils: Pupils are equal, round, and reactive to light.  Cardiovascular:     Rate and Rhythm: Normal rate and regular rhythm.  Pulmonary:     Effort: Pulmonary effort is normal.     Breath sounds: Normal breath sounds.  Abdominal:     General: Abdomen is flat.     Palpations: Abdomen is soft.  Musculoskeletal:        General: Normal range of motion.  Skin:    General: Skin is warm and dry.  Neurological:     General: No focal deficit present.     Mental Status: She is alert. Mental status is at baseline.  Psychiatric:        Mood and Affect: Mood normal.        Thought Content: Thought content normal.    Review of Systems  Constitutional: Negative.   HENT: Negative.    Eyes: Negative.    Respiratory: Negative.    Cardiovascular: Negative.   Gastrointestinal: Negative.   Musculoskeletal: Negative.   Skin: Negative.   Neurological: Negative.   Psychiatric/Behavioral: Negative.     Blood pressure (!) 87/60, pulse 61, temperature 98 F (36.7 C), temperature source Oral, resp. rate 18, height '5\' 1"'$  (1.549 m), weight 45 kg, last menstrual period 05/17/2022, SpO2 100 %. Body mass index is 18.74 kg/m.   Treatment Plan Summary: Medication management and Plan no change to medication management.  Encourage group attendance and interaction on the unit.  We are still working on some kind of discharge planning.  Alethia Berthold, MD 07/22/2022, 3:14 PM

## 2022-07-22 NOTE — Progress Notes (Signed)
Has been isolative to her room since the start of the shift.  She is pleasant and cooperative, but presents flat and depressed. She is med compliant and received her meds without incident. She denies si hi avh depression and anxiety at this encounter. Reports not feeling well and getting dizzy upon standing.  Blood pressure has been running  low. Educated on the importance of taking her time getting out of bed and not rising quickly. To make sure she was steady on her feet before taking steps. Patient verbalizes understanding. Will continue to monitor with q15 minute safety checks.   C Butler-Nicholson, LPN

## 2022-07-22 NOTE — Group Note (Signed)
[facility] LCSW Group Therapy Note   Group Date: 07/22/2022 Start Time: 1500 End Time: 1600   Type of Therapy/Topic:  Group Therapy:  Balance in Life  Participation Level:  Did Not Attend   Description of Group:    This group will address the concept of balance and how it feels and looks when one is unbalanced. Patients will be encouraged to process areas in their lives that are out of balance, and identify reasons for remaining unbalanced. Facilitators will guide patients utilizing problem- solving interventions to address and correct the stressor making their life unbalanced. Understanding and applying boundaries will be explored and addressed for obtaining  and maintaining a balanced life. Patients will be encouraged to explore ways to assertively make their unbalanced needs known to significant others in their lives, using other group members and facilitator for support and feedback.  Therapeutic Goals: Patient will identify two or more emotions or situations they have that consume much of in their lives. Patient will identify signs/triggers that life has become out of balance:  Patient will identify two ways to set boundaries in order to achieve balance in their lives:  Patient will demonstrate ability to communicate their needs through discussion and/or role plays  Summary of Patient Progress: X   Therapeutic Modalities:   Cognitive Behavioral Therapy Solution-Focused Therapy Assertiveness Training   Shirl Harris, LCSW

## 2022-07-23 DIAGNOSIS — F3164 Bipolar disorder, current episode mixed, severe, with psychotic features: Secondary | ICD-10-CM | POA: Diagnosis not present

## 2022-07-23 NOTE — Progress Notes (Signed)
[facility] MD Progress Note  07/23/2022 11:39 AM Autumn [name]  MRN:  [id number] Subjective: No new complaints.  Patient is sleepy this morning but has been attending groups has been much more appropriate has been working with treatment team.  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  Past Medical History:  Past Medical History:  Diagnosis Date   Allergy    Seasonal, Ultram (seizures)   Anemia 2005   Bipolar 1 disorder ([place])    Glaucoma    Seizures ([place]) 2008    Past Surgical History:  Procedure Laterality Date   BREAST EXCISIONAL BIOPSY Right    x 2   CESAREAN SECTION     x 2   LEEP  2000   Family History:  Family History  Problem Relation Age of Onset   Hypertension Mother    Hyperlipidemia Mother    Asthma Mother    Parkinson's disease Father    Lung cancer Maternal Grandmother    Other Paternal Grandfather 45       Cardiac arrest   Heart disease Paternal Grandfather    Tourette syndrome Daughter    Family Psychiatric  History: See previous Social History:  Social History   Substance and Sexual Activity  Alcohol Use Not Currently   Alcohol/week: 8.0 standard drinks of alcohol   Types: 8 Shots of liquor per week   Comment: last use 03/30/22 4x/yr     Social History   Substance and Sexual Activity  Drug Use Yes   Frequency: 1.0 times per week   Types: Marijuana, Cocaine, Heroin, Methamphetamines, MDMA (Ecstacy)   Comment: last use yesterday    Social History   Socioeconomic History   Marital status: Legally Separated    Spouse name: Not on file   Number of children: Not on file   Years of education: Not on file   Highest education level: Not on file  Occupational History   Not on file   Tobacco Use   Smoking status: Every Day    Packs/day: 0.50    Years: 9.00    Total pack years: 4.50    Types: E-cigarettes, Cigarettes, Cigars   Smokeless tobacco: Former    Types: Nurse, children's Use: Some days   Substances: Nicotine  Substance and Sexual Activity   Alcohol use: Not Currently    Alcohol/week: 8.0 standard drinks of alcohol    Types: 8 Shots of liquor per week    Comment: last use 03/30/22 4x/yr   Drug use: Yes    Frequency: 1.0 times per week    Types: Marijuana, Cocaine, Heroin, Methamphetamines, MDMA (Ecstacy)    Comment: last use yesterday   Sexual activity: Yes    Partners: Male    Birth control/protection: None  Other Topics Concern   Not on file  Social History Narrative   Not on file   Social Determinants of Health   Financial Resource Strain: Not on file  Food Insecurity: Not on file  Transportation Needs: Not on file  Physical Activity: Not on file  Stress: Not on file  Social Connections: Not on file   Additional Social History:  Sleep: Good  Appetite:  Good  Current Medications: Current Facility-Administered Medications  Medication Dose Route Frequency Provider Last Rate Last Admin   acetaminophen (TYLENOL) tablet 650 mg  650 mg Oral Q6H PRN Sherlon Handing, NP   650 mg at 07/11/22 2107   alum & mag hydroxide-simeth (MAALOX/MYLANTA) 200-200-20 MG/5ML suspension 30 mL  30 mL Oral Q4H PRN Waldon Merl F, NP       ARIPiprazole (ABILIFY) tablet 15 mg  15 mg Oral QHS Lockie Bothun T, MD   15 mg at 07/22/22 2112   divalproex (DEPAKOTE) DR tablet 1,250 mg  1,250 mg Oral QHS Janiel Crisostomo T, MD   1,250 mg at 07/22/22 2113   feeding supplement (ENSURE ENLIVE / ENSURE PLUS) liquid 237 mL  237 mL Oral BID BM Javarius Tsosie T, MD   237 mL at 07/23/22 0944   hydrOXYzine (ATARAX) tablet 25 mg  25 mg Oral TID PRN Sherlon Handing, NP   25 mg at 07/18/22 2059   lithium carbonate (ESKALITH) CR tablet  900 mg  900 mg Oral QHS Keiaira Donlan T, MD   900 mg at 07/22/22 2113   magnesium hydroxide (MILK OF MAGNESIA) suspension 30 mL  30 mL Oral Daily PRN Waldon Merl F, NP       nicotine polacrilex (NICORETTE) gum 2 mg  2 mg Oral PRN Rashi Giuliani, [name], MD   2 mg at 07/19/22 1202   QUEtiapine (SEROQUEL) tablet 300 mg  300 mg Oral QHS Parks Ranger, DO   300 mg at 07/22/22 2113   senna-docusate (Senokot-S) tablet 2 tablet  2 tablet Oral Daily PRN Anuar Walgren, [name], MD        Lab Results:  Results for orders placed or performed during the hospital encounter of 06/29/22 (from the past 48 hour(s))  SARS Coronavirus 2 by RT PCR (hospital order, performed in [facility] hospital lab) *cepheid single result test* Anterior Nasal Swab     Status: None   Collection Time: 07/22/22 10:24 AM   Specimen: Anterior Nasal Swab  Result Value Ref Range   SARS Coronavirus 2 by RT PCR NEGATIVE NEGATIVE    Comment: (NOTE) SARS-CoV-2 target nucleic acids are NOT DETECTED.  The SARS-CoV-2 RNA is generally detectable in upper and lower respiratory specimens during the acute phase of infection. The lowest concentration of SARS-CoV-2 viral copies this assay can detect is 250 copies / mL. A negative result does not preclude SARS-CoV-2 infection and should not be used as the sole basis for treatment or other patient management decisions.  A negative result may occur with improper specimen collection / handling, submission of specimen other than nasopharyngeal swab, presence of viral mutation(s) within the areas targeted by this assay, and inadequate number of viral copies (<250 copies / mL). A negative result must be combined with clinical observations, patient history, and epidemiological information.  Fact Sheet for Patients:   https://www.patel.info/  Fact Sheet for Healthcare Providers: https://hall.com/  This test is not yet approved or  cleared by the  [place] FDA and has been authorized for detection and/or diagnosis of SARS-CoV-2 by FDA under an Emergency Use Authorization (EUA).  This EUA will remain in effect (meaning this test can be used) for the duration of the COVID-19 declaration under Section 564(b)(1) of the Act, 21 U.S.C. section 360bbb-3(b)(1), unless the authorization is terminated or revoked sooner.  Performed at [facility] [address] [place],  [address]     Blood Alcohol  level:  Lab Results  Component Value Date   ETH <10 06/29/2022   ETH <10 14/48/1856    Metabolic Disorder Labs: Lab Results  Component Value Date   HGBA1C 4.9 12/25/2021   MPG 93.93 12/25/2021   MPG 114 06/02/2021   No results found for: "PROLACTIN" Lab Results  Component Value Date   CHOL 160 12/25/2021   TRIG 95 12/25/2021   HDL 58 12/25/2021   CHOLHDL 2.8 12/25/2021   VLDL 19 12/25/2021   LDLCALC 83 12/25/2021   LDLCALC 63 06/01/2021    Physical Findings: AIMS: Facial and Oral Movements Muscles of Facial Expression: None, normal Lips and Perioral Area: None, normal Jaw: None, normal Tongue: None, normal,Extremity Movements Upper (arms, wrists, hands, fingers): None, normal Lower (legs, knees, ankles, toes): None, normal, Trunk Movements Neck, shoulders, hips: None, normal, Overall Severity Severity of abnormal movements (highest score from questions above): None, normal Incapacitation due to abnormal movements: None, normal Patient's awareness of abnormal movements (rate only patient's report): No Awareness, Dental Status Current problems with teeth and/or dentures?: No Does patient usually wear dentures?: No  CIWA:    COWS:     Musculoskeletal: Strength & Muscle Tone: within normal limits Gait & Station: normal Patient leans: N/A  Psychiatric Specialty Exam:  Presentation  General Appearance: Casual; Neat  Eye Contact:Fair  Speech:Slow  Speech  Volume:Decreased  Handedness:Right   Mood and Affect  Mood:Depressed  Affect:Flat   Thought Process  Thought Processes:Coherent; Goal Directed  Descriptions of Associations:Circumstantial  Orientation:Full (Time, Place and Person)  Thought Content:Logical  History of Schizophrenia/Schizoaffective disorder:Yes  Duration of Psychotic Symptoms:Greater than six months  Hallucinations:No data recorded Ideas of Reference:None  Suicidal Thoughts:No data recorded Homicidal Thoughts:No data recorded  Sensorium  Memory:Immediate Fair; Immediate Good  Judgment:Fair  Insight:Fair   Executive Functions  Concentration:Fair  Attention Span:Fair  [place]   Psychomotor Activity  Psychomotor Activity:No data recorded  Assets  Assets:Communication Skills; Desire for Improvement   Sleep  Sleep:No data recorded   Physical Exam: Physical Exam Vitals reviewed.  Constitutional:      Appearance: Normal appearance.  HENT:     Head: Normocephalic and atraumatic.     Mouth/Throat:     Pharynx: Oropharynx is clear.  Eyes:     Pupils: Pupils are equal, round, and reactive to light.  Cardiovascular:     Rate and Rhythm: Normal rate and regular rhythm.  Pulmonary:     Effort: Pulmonary effort is normal.     Breath sounds: Normal breath sounds.  Abdominal:     General: Abdomen is flat.     Palpations: Abdomen is soft.  Musculoskeletal:        General: Normal range of motion.  Skin:    General: Skin is warm and dry.  Neurological:     General: No focal deficit present.     Mental Status: She is alert. Mental status is at baseline.  Psychiatric:        Mood and Affect: Mood normal.        Thought Content: Thought content normal.    Review of Systems  Constitutional: Negative.   HENT: Negative.    Eyes: Negative.   Respiratory: Negative.    Cardiovascular: Negative.   Gastrointestinal: Negative.    Musculoskeletal: Negative.   Skin: Negative.   Neurological: Negative.   Psychiatric/Behavioral: Negative.     Blood pressure 94/62, pulse (!) 55, temperature 97.8 F (36.6 C), temperature source Oral, resp. rate  18, height '5\' 1"'$  (1.549 m), weight 45 kg, last menstrual period 05/17/2022, SpO2 96 %. Body mass index is 18.74 kg/m.   Treatment Plan Summary: Medication management and Plan no change to medication management.  Encourage group attendance and participation.  Treatment team still aware of the difficulty finding appropriate disposition.  Alethia Berthold, MD 07/23/2022, 11:39 AM

## 2022-07-23 NOTE — Progress Notes (Signed)
   07/23/22 1940  Psych Admission Type (Psych Patients Only)  Admission Status Involuntary  Psychosocial Assessment  Patient Complaints None  Eye Contact Fair  Facial Expression Flat  Affect Appropriate to circumstance  Speech Logical/coherent  Interaction Minimal  Motor Activity Other (Comment) (pt in bed)  Appearance/Hygiene In scrubs  Behavior Characteristics Cooperative;Appropriate to situation  Mood Pleasant  Thought Process  Coherency WDL  Content WDL  Delusions None reported or observed  Perception WDL  Hallucination None reported or observed  Judgment WDL  Danger to Self  Current suicidal ideation? Denies  Danger to Others  Danger to Others None reported or observed   Progress note   D: Pt seen in her room. Pt denies SI, HI, AVH. Pt rates pain  0/10. Pt rates anxiety  0/10 and depression  0/10. Pt denies any other complaints at this time.  A: Pt provided support and encouragement. Pt given scheduled medication as prescribed. PRNs as appropriate. Q15 min checks for safety.   R: Pt safe on the unit. Will continue to monitor.

## 2022-07-23 NOTE — Progress Notes (Signed)
Isolative to her room this evening.  She is pleasant and cooperative.  She presents flat and depressed.  She did come out for snack this evening and interacted well with her peers. She is med compliant and took meds without issue.  She denies si  hi avh depression anxiety and pain at this encounter.  Will continue to offer support and encouragement.  Q15 minute safety  checks in place.   C Butler-Nicholson, LPN

## 2022-07-23 NOTE — Progress Notes (Signed)
Recreation Therapy Notes   Date: 07/23/2022  Time: 10:50 am   Location: Courtyard     Behavioral response: N/A   Intervention Topic: Social Skills   Discussion/Intervention: Patient refused to attend group.   Clinical Observations/Feedback:  Patient refused to attend group.   Tiea Manninen LRT/CTRS        Sina Sumpter 07/23/2022 1:04 PM

## 2022-07-23 NOTE — Progress Notes (Signed)
Pt denies SI/HI/AVH and verbally agrees to approach staff if these become apparent or before harming themselves/others. Rates depression 6/10. Rates anxiety 3/10. Rates pain 0/10. Pt has gone to groups but been in her room otherwise and has had no complaints. Scheduled medications administered to pt, per MD orders. RN provided support and encouragement to pt. Q15 min safety checks implemented and continued. Pt safe on the unit. RN will continue to monitor and intervene as needed.  Problem: Education: Goal: Emotional status will improve Outcome: Not Progressing   Problem: Activity: Goal: Interest or engagement in activities will improve Outcome: Not Progressing    07/23/22 0944  Psych Admission Type (Psych Patients Only)  Admission Status Involuntary  Psychosocial Assessment  Patient Complaints Anxiety;Depression  Eye Contact Fair  Facial Expression Flat;Sad  Affect Depressed;Anxious  Speech Logical/coherent  Interaction Assertive  Motor Activity Other (Comment) (WDL)  Appearance/Hygiene Disheveled;In scrubs  Behavior Characteristics Cooperative;Appropriate to situation;Anxious  Mood Depressed;Pleasant  Thought Process  Coherency WDL  Content WDL  Delusions None reported or observed  Perception WDL  Hallucination None reported or observed  Judgment WDL  Confusion None  Danger to Self  Current suicidal ideation? Denies  Danger to Others  Danger to Others None reported or observed  Danger to Others Abnormal  Harmful Behavior to others No threats or harm toward other people  Destructive Behavior No threats or harm toward property

## 2022-07-23 NOTE — Group Note (Signed)
[facility] LCSW Group Therapy Note   Group Date: 07/23/2022 Start Time: 1300 End Time: 1400  Type of Therapy and Topic:  Group Therapy:  Feelings around Relapse and Recovery  Participation Level:  Minimal   Mood: Euthymic    Description of Group:    Patients in this group will discuss emotions they experience before and after a relapse. They will process how experiencing these feelings, or avoidance of experiencing them, relates to having a relapse. Facilitator will guide patients to explore emotions they have related to recovery. Patients will be encouraged to process which emotions are more powerful. They will be guided to discuss the emotional reaction significant others in their lives may have to patients' relapse or recovery. Patients will be assisted in exploring ways to respond to the emotions of others without this contributing to a relapse.  Therapeutic Goals: Patient will identify two or more emotions that lead to relapse for them:  Patient will identify two emotions that result when they relapse:  Patient will identify two emotions related to recovery:  Patient will demonstrate ability to communicate their needs through discussion and/or role plays.   Summary of Patient Progress:  Patient was present for the entirety of group session. Patient participated in opening and closing remarks. However, patient did not contribute at all to the topic of discussion despite encouraged participation.    Therapeutic Modalities:   Cognitive Behavioral Therapy Solution-Focused Therapy Assertiveness Training Relapse Prevention Therapy   Durenda Hurt, [place]

## 2022-07-24 DIAGNOSIS — F3164 Bipolar disorder, current episode mixed, severe, with psychotic features: Secondary | ICD-10-CM | POA: Diagnosis not present

## 2022-07-24 LAB — SARS CORONAVIRUS 2 BY RT PCR: SARS Coronavirus 2 by RT PCR: NEGATIVE

## 2022-07-24 NOTE — Progress Notes (Signed)
[facility] MD Progress Note  07/24/2022 11:17 AM Autumn [name]  MRN:  [id number] Subjective: Autumn [name] is seen on rounds.  She has no complaints and no issues.  She has been compliant with her medications and denies any side effects.  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) 2008    Past Surgical History:  Procedure Laterality Date   BREAST EXCISIONAL BIOPSY Right    x 2   CESAREAN SECTION     x 2   LEEP  2000   Family History:  Family History  Problem Relation Age of Onset   Hypertension Mother    Hyperlipidemia Mother    Asthma Mother    Parkinson's disease Father    Lung cancer Maternal Grandmother    Other Paternal Grandfather 67       Cardiac arrest   Heart disease Paternal Grandfather    Tourette syndrome Daughter     Social History:  Social History   Substance and Sexual Activity  Alcohol Use Not Currently   Alcohol/week: 8.0 standard drinks of alcohol   Types: 8 Shots of liquor per week   Comment: last use 03/30/22 4x/yr     Social History   Substance and Sexual Activity  Drug Use Yes   Frequency: 1.0 times per week   Types: Marijuana, Cocaine, Heroin, Methamphetamines, MDMA (Ecstacy)   Comment: last use yesterday    Social History   Socioeconomic History   Marital status: Legally Separated    Spouse name: Not on file   Number of children: Not on file   Years of education: Not on file   Highest education level: Not on file  Occupational History   Not on file  Tobacco Use   Smoking status: Every Day    Packs/day: 0.50    Years: 9.00     Total pack years: 4.50    Types: E-cigarettes, Cigarettes, Cigars   Smokeless tobacco: Former    Types: Nurse, children's Use: Some days   Substances: Nicotine  Substance and Sexual Activity   Alcohol use: Not Currently    Alcohol/week: 8.0 standard drinks of alcohol    Types: 8 Shots of liquor per week    Comment: last use 03/30/22 4x/yr   Drug use: Yes    Frequency: 1.0 times per week    Types: Marijuana, Cocaine, Heroin, Methamphetamines, MDMA (Ecstacy)    Comment: last use yesterday   Sexual activity: Yes    Partners: Male    Birth control/protection: None  Other Topics Concern   Not on file  Social History Narrative   Not on file   Social Determinants of Health   Financial Resource Strain: Not on file  Food Insecurity: Not on file  Transportation Needs: Not on file  Physical Activity: Not on file  Stress: Not on file  Social Connections: Not on file   Additional Social History:                         Sleep: Good  Appetite:  Good  Current  Medications: Current Facility-Administered Medications  Medication Dose Route Frequency Provider Last Rate Last Admin   acetaminophen (TYLENOL) tablet 650 mg  650 mg Oral Q6H PRN Sherlon Handing, NP   650 mg at 07/11/22 2107   alum & mag hydroxide-simeth (MAALOX/MYLANTA) 200-200-20 MG/5ML suspension 30 mL  30 mL Oral Q4H PRN Waldon Merl F, NP       ARIPiprazole (ABILIFY) tablet 15 mg  15 mg Oral QHS Clapacs, [name], MD   15 mg at 07/23/22 2111   divalproex (DEPAKOTE) DR tablet 1,250 mg  1,250 mg Oral QHS Clapacs, [name], MD   1,250 mg at 07/23/22 2111   feeding supplement (ENSURE ENLIVE / ENSURE PLUS) liquid 237 mL  237 mL Oral BID BM Clapacs, [name], MD   237 mL at 07/24/22 1054   hydrOXYzine (ATARAX) tablet 25 mg  25 mg Oral TID PRN Sherlon Handing, NP   25 mg at 07/18/22 2059   lithium carbonate (ESKALITH) CR tablet 900 mg  900 mg Oral QHS Clapacs, [name], MD   900 mg at 07/23/22 2112   magnesium  hydroxide (MILK OF MAGNESIA) suspension 30 mL  30 mL Oral Daily PRN Waldon Merl F, NP       nicotine polacrilex (NICORETTE) gum 2 mg  2 mg Oral PRN Clapacs, [name], MD   2 mg at 07/19/22 1202   QUEtiapine (SEROQUEL) tablet 300 mg  300 mg Oral QHS Parks Ranger, DO   300 mg at 07/23/22 2111   senna-docusate (Senokot-S) tablet 2 tablet  2 tablet Oral Daily PRN Clapacs, [name], MD        Lab Results: No results found for this or any previous visit (from the past 48 hour(s)).  Blood Alcohol level:  Lab Results  Component Value Date   ETH <10 06/29/2022   ETH <10 66/44/0347    Metabolic Disorder Labs: Lab Results  Component Value Date   HGBA1C 4.9 12/25/2021   MPG 93.93 12/25/2021   MPG 114 06/02/2021   No results found for: "PROLACTIN" Lab Results  Component Value Date   CHOL 160 12/25/2021   TRIG 95 12/25/2021   HDL 58 12/25/2021   CHOLHDL 2.8 12/25/2021   VLDL 19 12/25/2021   LDLCALC 83 12/25/2021   LDLCALC 63 06/01/2021    Physical Findings: AIMS: Facial and Oral Movements Muscles of Facial Expression: None, normal Lips and Perioral Area: None, normal Jaw: None, normal Tongue: None, normal,Extremity Movements Upper (arms, wrists, hands, fingers): None, normal Lower (legs, knees, ankles, toes): None, normal, Trunk Movements Neck, shoulders, hips: None, normal, Overall Severity Severity of abnormal movements (highest score from questions above): None, normal Incapacitation due to abnormal movements: None, normal Patient's awareness of abnormal movements (rate only patient's report): No Awareness, Dental Status Current problems with teeth and/or dentures?: No Does patient usually wear dentures?: No  CIWA:    COWS:     Musculoskeletal: Strength & Muscle Tone: within normal limits Gait & Station: normal Patient leans: N/A  Psychiatric Specialty Exam:  Presentation  General Appearance: Casual; Neat  Eye Contact:Fair  Speech:Slow  Speech  Volume:Decreased  Handedness:Right   Mood and Affect  Mood:Depressed  Affect:Flat   Thought Process  Thought Processes:Coherent; Goal Directed  Descriptions of Associations:Circumstantial  Orientation:Full (Time, Place and Person)  Thought Content:Logical  History of Schizophrenia/Schizoaffective disorder:Yes  Duration of Psychotic Symptoms:Greater than six months  Hallucinations:No data recorded Ideas of Reference:None  Suicidal Thoughts:No data recorded Homicidal Thoughts:No data recorded  Sensorium  Memory:Immediate Fair; Immediate Good  Judgment:Fair  Insight:Fair   Executive Functions  Concentration:Fair  Attention Span:Fair  [place]   Psychomotor Activity  Psychomotor Activity:No data recorded  Assets  Assets:Communication Skills; Desire for Improvement   Sleep  Sleep:No data recorded    Blood pressure 96/68, pulse (!) 57, temperature 97.9 F (36.6 C), temperature source Oral, resp. rate 18, height '5\' 1"'$  (1.549 m), weight 45 kg, last menstrual period 05/17/2022, SpO2 100 %. Body mass index is 18.74 kg/m.   Treatment Plan Summary: Daily contact with patient to assess and evaluate symptoms and progress in treatment, Medication management, and Plan continue current medications.  Parks Ranger, DO 07/24/2022, 11:17 AM

## 2022-07-24 NOTE — Progress Notes (Signed)
Patient alert, verbal and able to make needs known. Patient has no acute complaints today. Denies current SI/HI/AVH. Denies any pain at present. Was given her ensure this morning. Contracts for safety. Will continue q66mn checks and monitor.

## 2022-07-25 DIAGNOSIS — F3164 Bipolar disorder, current episode mixed, severe, with psychotic features: Secondary | ICD-10-CM | POA: Diagnosis not present

## 2022-07-25 NOTE — Progress Notes (Signed)
[facility] MD Progress Note  07/25/2022 11:41 AM Autumn [name]  MRN:  [id number] Subjective: Autumn [name] is seen on rounds.  She does not have any complaints.  She has been compliant with her medications and denies any side effects.  She is very pleasant and cooperative.  She slept well.  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  Past Medical History:  Past Medical History:  Diagnosis Date   Allergy    Seasonal, Ultram (seizures)   Anemia 2005   Bipolar 1 disorder ([place])    Glaucoma    Seizures ([place]) 2008    Past Surgical History:  Procedure Laterality Date   BREAST EXCISIONAL BIOPSY Right    x 2   CESAREAN SECTION     x 2   LEEP  2000   Family History:  Family History  Problem Relation Age of Onset   Hypertension Mother    Hyperlipidemia Mother    Asthma Mother    Parkinson's disease Father    Lung cancer Maternal Grandmother    Other Paternal Grandfather 49       Cardiac arrest   Heart disease Paternal Grandfather    Tourette syndrome Daughter     Social History:  Social History   Substance and Sexual Activity  Alcohol Use Not Currently   Alcohol/week: 8.0 standard drinks of alcohol   Types: 8 Shots of liquor per week   Comment: last use 03/30/22 4x/yr     Social History   Substance and Sexual Activity  Drug Use Yes   Frequency: 1.0 times per week   Types: Marijuana, Cocaine, Heroin, Methamphetamines, MDMA (Ecstacy)   Comment: last use yesterday    Social History   Socioeconomic History   Marital status: Legally Separated    Spouse name: Not on file   Number of children: Not on file   Years of education: Not on file   Highest education level: Not on file  Occupational History   Not on file  Tobacco Use   Smoking status:  Every Day    Packs/day: 0.50    Years: 9.00    Total pack years: 4.50    Types: E-cigarettes, Cigarettes, Cigars   Smokeless tobacco: Former    Types: Nurse, children's Use: Some days   Substances: Nicotine  Substance and Sexual Activity   Alcohol use: Not Currently    Alcohol/week: 8.0 standard drinks of alcohol    Types: 8 Shots of liquor per week    Comment: last use 03/30/22 4x/yr   Drug use: Yes    Frequency: 1.0 times per week    Types: Marijuana, Cocaine, Heroin, Methamphetamines, MDMA (Ecstacy)    Comment: last use yesterday   Sexual activity: Yes    Partners: Male    Birth control/protection: None  Other Topics Concern   Not on file  Social History Narrative   Not on file   Social Determinants of Health   Financial Resource Strain: Not on file  Food Insecurity: Not on file  Transportation Needs: Not on file  Physical Activity: Not on file  Stress: Not on file  Social Connections: Not on file   Additional Social History:  Sleep: Good  Appetite:  Good  Current Medications: Current Facility-Administered Medications  Medication Dose Route Frequency Provider Last Rate Last Admin   acetaminophen (TYLENOL) tablet 650 mg  650 mg Oral Q6H PRN Sherlon Handing, NP   650 mg at 07/11/22 2107   alum & mag hydroxide-simeth (MAALOX/MYLANTA) 200-200-20 MG/5ML suspension 30 mL  30 mL Oral Q4H PRN Waldon Merl F, NP       ARIPiprazole (ABILIFY) tablet 15 mg  15 mg Oral QHS Clapacs, [name], MD   15 mg at 07/24/22 2104   divalproex (DEPAKOTE) DR tablet 1,250 mg  1,250 mg Oral QHS Clapacs, [name], MD   1,250 mg at 07/24/22 2104   feeding supplement (ENSURE ENLIVE / ENSURE PLUS) liquid 237 mL  237 mL Oral BID BM Clapacs, [name], MD   237 mL at 07/25/22 0954   hydrOXYzine (ATARAX) tablet 25 mg  25 mg Oral TID PRN Sherlon Handing, NP   25 mg at 07/18/22 2059   lithium carbonate (ESKALITH) CR tablet 900 mg  900 mg Oral QHS Clapacs,  [name], MD   900 mg at 07/24/22 2104   magnesium hydroxide (MILK OF MAGNESIA) suspension 30 mL  30 mL Oral Daily PRN Waldon Merl F, NP       nicotine polacrilex (NICORETTE) gum 2 mg  2 mg Oral PRN Clapacs, [name], MD   2 mg at 07/19/22 1202   QUEtiapine (SEROQUEL) tablet 300 mg  300 mg Oral QHS Parks Ranger, DO   300 mg at 07/24/22 2104   senna-docusate (Senokot-S) tablet 2 tablet  2 tablet Oral Daily PRN Clapacs, [name], MD        Lab Results:  Results for orders placed or performed during the hospital encounter of 06/29/22 (from the past 48 hour(s))  SARS Coronavirus 2 by RT PCR (hospital order, performed in [facility] hospital lab) *cepheid single result test* Anterior Nasal Swab     Status: None   Collection Time: 07/24/22 12:20 PM   Specimen: Anterior Nasal Swab  Result Value Ref Range   SARS Coronavirus 2 by RT PCR NEGATIVE NEGATIVE    Comment: (NOTE) SARS-CoV-2 target nucleic acids are NOT DETECTED.  The SARS-CoV-2 RNA is generally detectable in upper and lower respiratory specimens during the acute phase of infection. The lowest concentration of SARS-CoV-2 viral copies this assay can detect is 250 copies / mL. A negative result does not preclude SARS-CoV-2 infection and should not be used as the sole basis for treatment or other patient management decisions.  A negative result may occur with improper specimen collection / handling, submission of specimen other than nasopharyngeal swab, presence of viral mutation(s) within the areas targeted by this assay, and inadequate number of viral copies (<250 copies / mL). A negative result must be combined with clinical observations, patient history, and epidemiological information.  Fact Sheet for Patients:   https://www.patel.info/  Fact Sheet for Healthcare Providers: https://hall.com/  This test is not yet approved or  cleared by the [place] FDA and has been  authorized for detection and/or diagnosis of SARS-CoV-2 by FDA under an Emergency Use Authorization (EUA).  This EUA will remain in effect (meaning this test can be used) for the duration of the COVID-19 declaration under Section 564(b)(1) of the Act, 21 U.S.C. section 360bbb-3(b)(1), unless the authorization is terminated or revoked sooner.  Performed at [facility], [address], [place], [place] [address]     Blood Alcohol  level:  Lab Results  Component Value Date   ETH <10 06/29/2022   ETH <10 20/25/4270    Metabolic Disorder Labs: Lab Results  Component Value Date   HGBA1C 4.9 12/25/2021   MPG 93.93 12/25/2021   MPG 114 06/02/2021   No results found for: "PROLACTIN" Lab Results  Component Value Date   CHOL 160 12/25/2021   TRIG 95 12/25/2021   HDL 58 12/25/2021   CHOLHDL 2.8 12/25/2021   VLDL 19 12/25/2021   LDLCALC 83 12/25/2021   LDLCALC 63 06/01/2021    Physical Findings: AIMS: Facial and Oral Movements Muscles of Facial Expression: None, normal Lips and Perioral Area: None, normal Jaw: None, normal Tongue: None, normal,Extremity Movements Upper (arms, wrists, hands, fingers): None, normal Lower (legs, knees, ankles, toes): None, normal, Trunk Movements Neck, shoulders, hips: None, normal, Overall Severity Severity of abnormal movements (highest score from questions above): None, normal Incapacitation due to abnormal movements: None, normal Patient's awareness of abnormal movements (rate only patient's report): No Awareness, Dental Status Current problems with teeth and/or dentures?: No Does patient usually wear dentures?: No  CIWA:    COWS:     Musculoskeletal: Strength & Muscle Tone: within normal limits Gait & Station: normal Patient leans: N/A  Psychiatric Specialty Exam:  Presentation  General Appearance: Casual; Neat  Eye Contact:Fair  Speech:Slow  Speech Volume:Decreased  Handedness:Right   Mood and Affect   Mood:Depressed  Affect:Flat   Thought Process  Thought Processes:Coherent; Goal Directed  Descriptions of Associations:Circumstantial  Orientation:Full (Time, Place and Person)  Thought Content:Logical  History of Schizophrenia/Schizoaffective disorder:Yes  Duration of Psychotic Symptoms:Greater than six months  Hallucinations:No data recorded Ideas of Reference:None  Suicidal Thoughts:No data recorded Homicidal Thoughts:No data recorded  Sensorium  Memory:Immediate Fair; Immediate Good  Judgment:Fair  Insight:Fair   Executive Functions  Concentration:Fair  Attention Span:Fair  [place]   Psychomotor Activity  Psychomotor Activity:No data recorded  Assets  Assets:Communication Skills; Desire for Improvement   Sleep  Sleep:No data recorded   Blood pressure (!) 87/69, pulse (!) 58, temperature 97.8 F (36.6 C), temperature source Oral, resp. rate 18, height '5\' 1"'$  (1.549 m), weight 45 kg, last menstrual period 05/17/2022, SpO2 (!) 72 %. Body mass index is 18.74 kg/m.   Treatment Plan Summary: Daily contact with patient to assess and evaluate symptoms and progress in treatment, Medication management, and Plan continue current medications.  Parks Ranger, DO 07/25/2022, 11:41 AM

## 2022-07-25 NOTE — Progress Notes (Signed)
Patient was cooperative with treatment on shift. She denies SI, Hi & AVH. She talked about wanting to find placement out of town but she knows she does not have insurance.  No new behavioral issues to report on shift at this time.

## 2022-07-25 NOTE — Plan of Care (Signed)
D: Patient alert and oriented. Patient denies pain. Patient denies anxiety and depression. Patient denies SI/HI/AVH. Patient has been isolative to room during shift.  A: Patient did not have scheduled medication during shift, with exception to scheduled ensures.  Support and encouragement provided to patient.  Q15 minute safety checks maintained.   R: Patient compliant with  treatment plan. Patient remains safe on the unit at this time.  Problem: Education: Goal: Knowledge of [facility] Education information/materials will improve Outcome: Progressing Goal: Emotional status will improve Outcome: Progressing Goal: Mental status will improve Outcome: Progressing   Problem: Health Behavior/Discharge Planning: Goal: Identification of resources available to assist in meeting health care needs will improve Outcome: Progressing Goal: Compliance with treatment plan for underlying cause of condition will improve Outcome: Progressing   Problem: Physical Regulation: Goal: Ability to maintain clinical measurements within normal limits will improve Outcome: Progressing   Problem: Safety: Goal: Periods of time without injury will increase Outcome: Progressing

## 2022-07-26 NOTE — Plan of Care (Signed)
D: Patient alert and oriented. Patient denies pain. Patient denies anxiety and depression. Patient denies SI/HI/AVH. Patient has been isolative to room during shift.  A: Patient did not have scheduled medications during shift with exception to scheduled ensures.Support and encouragement provided to patient.  Q15 minute safety checks maintained.   R: Patient compliant with treatment plan. Patient remains safe on the unit at this time.    Problem: Education: Goal: Knowledge of [facility] [facility] Education information/materials will improve Outcome: Progressing Goal: Emotional status will improve Outcome: Progressing Goal: Mental status will improve Outcome: Progressing Goal: Verbalization of understanding the information provided will improve Outcome: Progressing   Problem: Health Behavior/Discharge Planning: Goal: Compliance with treatment plan for underlying cause of condition will improve Outcome: Progressing   Problem: Safety: Goal: Periods of time without injury will increase Outcome: Progressing

## 2022-07-26 NOTE — Progress Notes (Signed)
[facility] MD Progress Note  07/26/2022 3:59 PM Autumn [name]  MRN:  [id number] Subjective: No new complaints.  Reports mood is good.  Spends most of her time in bed but affect is brighter and interactions are appropriate. Principal Problem: Bipolar affective disorder ([place]) Diagnosis: Principal Problem:   Bipolar affective disorder ([place])  Total Time spent with patient: 15 minutes  Past Psychiatric History: Past history of schizoaffective or bipolar disorder  Past Medical History:  Past Medical History:  Diagnosis Date   Allergy    Seasonal, Ultram (seizures)   Anemia 2005   Bipolar 1 disorder ([place])    Glaucoma    Seizures ([place]) 2008    Past Surgical History:  Procedure Laterality Date   BREAST EXCISIONAL BIOPSY Right    x 2   CESAREAN SECTION     x 2   LEEP  2000   Family History:  Family History  Problem Relation Age of Onset   Hypertension Mother    Hyperlipidemia Mother    Asthma Mother    Parkinson's disease Father    Lung cancer Maternal Grandmother    Other Paternal Grandfather 96       Cardiac arrest   Heart disease Paternal Grandfather    Tourette syndrome Daughter    Family Psychiatric  History: See previous Social History:  Social History   Substance and Sexual Activity  Alcohol Use Not Currently   Alcohol/week: 8.0 standard drinks of alcohol   Types: 8 Shots of liquor per week   Comment: last use 03/30/22 4x/yr     Social History   Substance and Sexual Activity  Drug Use Yes   Frequency: 1.0 times per week   Types: Marijuana, Cocaine, Heroin, Methamphetamines, MDMA (Ecstacy)   Comment: last use yesterday    Social History   Socioeconomic History   Marital status: Legally Separated    Spouse name: Not on file   Number of children: Not on file   Years of education: Not on file   Highest education level: Not on file  Occupational History   Not on file  Tobacco Use   Smoking status: Every Day    Packs/day: 0.50    Years: 9.00    Total pack  years: 4.50    Types: E-cigarettes, Cigarettes, Cigars   Smokeless tobacco: Former    Types: Nurse, children's Use: Some days   Substances: Nicotine  Substance and Sexual Activity   Alcohol use: Not Currently    Alcohol/week: 8.0 standard drinks of alcohol    Types: 8 Shots of liquor per week    Comment: last use 03/30/22 4x/yr   Drug use: Yes    Frequency: 1.0 times per week    Types: Marijuana, Cocaine, Heroin, Methamphetamines, MDMA (Ecstacy)    Comment: last use yesterday   Sexual activity: Yes    Partners: Male    Birth control/protection: None  Other Topics Concern   Not on file  Social History Narrative   Not on file   Social Determinants of Health   Financial Resource Strain: Not on file  Food Insecurity: Not on file  Transportation Needs: Not on file  Physical Activity: Not on file  Stress: Not on file  Social Connections: Not on file   Additional Social History:                         Sleep: Fair  Appetite:  Fair  Current Medications: Current Facility-Administered  Medications  Medication Dose Route Frequency Provider Last Rate Last Admin   acetaminophen (TYLENOL) tablet 650 mg  650 mg Oral Q6H PRN Waldon Merl F, NP   650 mg at 07/11/22 2107   alum & mag hydroxide-simeth (MAALOX/MYLANTA) 200-200-20 MG/5ML suspension 30 mL  30 mL Oral Q4H PRN Waldon Merl F, NP       ARIPiprazole (ABILIFY) tablet 15 mg  15 mg Oral QHS Anetta Olvera T, MD   15 mg at 07/25/22 2127   divalproex (DEPAKOTE) DR tablet 1,250 mg  1,250 mg Oral QHS Terrius Gentile T, MD   1,250 mg at 07/25/22 2127   feeding supplement (ENSURE ENLIVE / ENSURE PLUS) liquid 237 mL  237 mL Oral BID BM Lido Maske T, MD   237 mL at 07/26/22 1403   hydrOXYzine (ATARAX) tablet 25 mg  25 mg Oral TID PRN Sherlon Handing, NP   25 mg at 07/18/22 2059   lithium carbonate (ESKALITH) CR tablet 900 mg  900 mg Oral QHS Danish Ruffins T, MD   900 mg at 07/25/22 2127   magnesium hydroxide  (MILK OF MAGNESIA) suspension 30 mL  30 mL Oral Daily PRN Waldon Merl F, NP       nicotine polacrilex (NICORETTE) gum 2 mg  2 mg Oral PRN Laurie Lovejoy, [name], MD   2 mg at 07/19/22 1202   QUEtiapine (SEROQUEL) tablet 300 mg  300 mg Oral QHS Parks Ranger, DO   300 mg at 07/25/22 2128   senna-docusate (Senokot-S) tablet 2 tablet  2 tablet Oral Daily PRN Rozena Fierro, [name], MD        Lab Results: No results found for this or any previous visit (from the past 48 hour(s)).  Blood Alcohol level:  Lab Results  Component Value Date   ETH <10 06/29/2022   ETH <10 16/60/6301    Metabolic Disorder Labs: Lab Results  Component Value Date   HGBA1C 4.9 12/25/2021   MPG 93.93 12/25/2021   MPG 114 06/02/2021   No results found for: "PROLACTIN" Lab Results  Component Value Date   CHOL 160 12/25/2021   TRIG 95 12/25/2021   HDL 58 12/25/2021   CHOLHDL 2.8 12/25/2021   VLDL 19 12/25/2021   LDLCALC 83 12/25/2021   LDLCALC 63 06/01/2021    Physical Findings: AIMS: Facial and Oral Movements Muscles of Facial Expression: None, normal Lips and Perioral Area: None, normal Jaw: None, normal Tongue: None, normal,Extremity Movements Upper (arms, wrists, hands, fingers): None, normal Lower (legs, knees, ankles, toes): None, normal, Trunk Movements Neck, shoulders, hips: None, normal, Overall Severity Severity of abnormal movements (highest score from questions above): None, normal Incapacitation due to abnormal movements: None, normal Patient's awareness of abnormal movements (rate only patient's report): No Awareness, Dental Status Current problems with teeth and/or dentures?: No Does patient usually wear dentures?: No  CIWA:    COWS:     Musculoskeletal: Strength & Muscle Tone: within normal limits Gait & Station: normal Patient leans: N/A  Psychiatric Specialty Exam:  Presentation  General Appearance: Casual; Neat  Eye Contact:Fair  Speech:Slow  Speech  Volume:Decreased  Handedness:Right   Mood and Affect  Mood:Depressed  Affect:Flat   Thought Process  Thought Processes:Coherent; Goal Directed  Descriptions of Associations:Circumstantial  Orientation:Full (Time, Place and Person)  Thought Content:Logical  History of Schizophrenia/Schizoaffective disorder:Yes  Duration of Psychotic Symptoms:Greater than six months  Hallucinations:No data recorded Ideas of Reference:None  Suicidal Thoughts:No data recorded Homicidal Thoughts:No data recorded  Sensorium  Memory:Immediate Fair; Immediate Good  Judgment:Fair  Insight:Fair   Executive Functions  Concentration:Fair  Attention Span:Fair  [place]   Psychomotor Activity  Psychomotor Activity:No data recorded  Assets  Assets:Communication Skills; Desire for Improvement   Sleep  Sleep:No data recorded   Physical Exam: Physical Exam Nursing note reviewed.  Constitutional:      Appearance: Normal appearance.  HENT:     Head: Normocephalic and atraumatic.     Mouth/Throat:     Pharynx: Oropharynx is clear.  Eyes:     Pupils: Pupils are equal, round, and reactive to light.  Cardiovascular:     Rate and Rhythm: Normal rate and regular rhythm.  Pulmonary:     Effort: Pulmonary effort is normal.     Breath sounds: Normal breath sounds.  Abdominal:     General: Abdomen is flat.     Palpations: Abdomen is soft.  Musculoskeletal:        General: Normal range of motion.  Skin:    General: Skin is warm and dry.  Neurological:     General: No focal deficit present.     Mental Status: She is alert. Mental status is at baseline.  Psychiatric:        Mood and Affect: Mood normal.        Thought Content: Thought content normal.    Review of Systems  Constitutional: Negative.   HENT: Negative.    Eyes: Negative.   Respiratory: Negative.    Cardiovascular: Negative.   Gastrointestinal: Negative.    Musculoskeletal: Negative.   Skin: Negative.   Neurological: Negative.   Psychiatric/Behavioral: Negative.     Blood pressure 98/64, pulse 62, temperature 97.9 F (36.6 C), temperature source Oral, resp. rate 18, height '5\' 1"'$  (1.549 m), weight 45 kg, last menstrual period 05/17/2022, SpO2 100 %. Body mass index is 18.74 kg/m.   Treatment Plan Summary: Plan advised patient that we really would like to try to get her discharged soon.  Strongly encouraged her to make the calls to contact places where she might be able to stay.  We are at something of a loss for any kind of discharge plan.  She is going to some groups.  Alethia Berthold, MD 07/26/2022, 3:59 PM

## 2022-07-26 NOTE — Group Note (Signed)

## 2022-07-26 NOTE — Progress Notes (Signed)
Recreation Therapy Notes  Date: 07/26/2022  Time: 10:20 am   Location: Craft room    Behavioral response: N/A   Intervention Topic: Coping Skills    Discussion/Intervention: Patient refused to attend group.   Clinical Observations/Feedback:  Patient refused to attend group.   Raylyn Speckman LRT/CTRS         Reverie Vaquera 07/26/2022 12:13 PM

## 2022-07-26 NOTE — BH IP Treatment Plan (Signed)
Interdisciplinary Treatment and Diagnostic Plan Update  07/26/2022 Time of Session: 08:30 Autumn [name] MRN: [id number]  Principal Diagnosis: Bipolar affective disorder [facility])  Secondary Diagnoses: Principal Problem:   Bipolar affective disorder ([place])   Current Medications:  Current Facility-Administered Medications  Medication Dose Route Frequency Provider Last Rate Last Admin   acetaminophen (TYLENOL) tablet 650 mg  650 mg Oral Q6H PRN Waldon Merl F, NP   650 mg at 07/11/22 2107   alum & mag hydroxide-simeth (MAALOX/MYLANTA) 200-200-20 MG/5ML suspension 30 mL  30 mL Oral Q4H PRN Waldon Merl F, NP       ARIPiprazole (ABILIFY) tablet 15 mg  15 mg Oral QHS Clapacs, [name], MD   15 mg at 07/25/22 2127   divalproex (DEPAKOTE) DR tablet 1,250 mg  1,250 mg Oral QHS Clapacs, [name], MD   1,250 mg at 07/25/22 2127   feeding supplement (ENSURE ENLIVE / ENSURE PLUS) liquid 237 mL  237 mL Oral BID BM Clapacs, [name], MD   237 mL at 07/25/22 1430   hydrOXYzine (ATARAX) tablet 25 mg  25 mg Oral TID PRN Sherlon Handing, NP   25 mg at 07/18/22 2059   lithium carbonate (ESKALITH) CR tablet 900 mg  900 mg Oral QHS Clapacs, [name], MD   900 mg at 07/25/22 2127   magnesium hydroxide (MILK OF MAGNESIA) suspension 30 mL  30 mL Oral Daily PRN Waldon Merl F, NP       nicotine polacrilex (NICORETTE) gum 2 mg  2 mg Oral PRN Clapacs, [name], MD   2 mg at 07/19/22 1202   QUEtiapine (SEROQUEL) tablet 300 mg  300 mg Oral QHS Parks Ranger, DO   300 mg at 07/25/22 2128   senna-docusate (Senokot-S) tablet 2 tablet  2 tablet Oral Daily PRN Clapacs, [name], MD       PTA Medications: Medications Prior to Admission  Medication Sig Dispense Refill Last Dose   ARIPiprazole (ABILIFY) 15 MG tablet Take 1 tablet (15 mg total) by mouth daily. (Patient not taking: Reported on 05/29/2022) 30 tablet 0    divalproex (DEPAKOTE) 250 MG DR tablet Take 2 tablets (500 mg total) by mouth every 12 (twelve)  hours. (Patient not taking: Reported on 05/29/2022) 120 tablet 0    lithium carbonate (LITHOBID) 300 MG CR tablet Take 2 tablets (600 mg total) by mouth every 12 (twelve) hours. (Patient not taking: Reported on 05/29/2022) 120 tablet 0    norethindrone (MICRONOR) 0.35 MG tablet Take 1 tablet (0.35 mg total) by mouth daily. (Patient not taking: Reported on 05/20/2022) 28 tablet 13    QUEtiapine (SEROQUEL) 200 MG tablet Take 1 tablet (200 mg total) by mouth at bedtime. (Patient not taking: Reported on 05/29/2022) 30 tablet 0     Patient Stressors:    Patient Strengths:    Treatment Modalities: Medication Management, Group therapy, Case management,  1 to 1 session with clinician, Psychoeducation, Recreational therapy.   Physician Treatment Plan for Primary Diagnosis: Bipolar affective disorder ([place]) Long Term Goal(s): Improvement in symptoms so as ready for discharge   Short Term Goals: Ability to identify and develop effective coping behaviors will improve Ability to maintain clinical measurements within normal limits will improve Compliance with prescribed medications will improve Ability to demonstrate self-control will improve  Medication Management: Evaluate patient's response, side effects, and tolerance of medication regimen.  Therapeutic Interventions: 1 to 1 sessions, Unit Group sessions and Medication administration.  Evaluation of Outcomes: Progressing  Physician Treatment  Plan for Secondary Diagnosis: Principal Problem:   Bipolar affective disorder ([place])  Long Term Goal(s): Improvement in symptoms so as ready for discharge   Short Term Goals: Ability to identify and develop effective coping behaviors will improve Ability to maintain clinical measurements within normal limits will improve Compliance with prescribed medications will improve Ability to demonstrate self-control will improve     Medication Management: Evaluate patient's response, side effects, and tolerance of  medication regimen.  Therapeutic Interventions: 1 to 1 sessions, Unit Group sessions and Medication administration.  Evaluation of Outcomes: Progressing   RN Treatment Plan for Primary Diagnosis: Bipolar affective disorder ([place]) Long Term Goal(s): Knowledge of disease and therapeutic regimen to maintain health will improve  Short Term Goals: Ability to remain free from injury will improve, Ability to verbalize frustration and anger appropriately will improve, Ability to demonstrate self-control, Ability to participate in decision making will improve, Ability to verbalize feelings will improve, Ability to disclose and discuss suicidal ideas, Ability to identify and develop effective coping behaviors will improve, and Compliance with prescribed medications will improve  Medication Management: RN will administer medications as ordered by provider, will assess and evaluate patient's response and provide education to patient for prescribed medication. RN will report any adverse and/or side effects to prescribing provider.  Therapeutic Interventions: 1 on 1 counseling sessions, Psychoeducation, Medication administration, Evaluate responses to treatment, Monitor vital signs and CBGs as ordered, Perform/monitor CIWA, COWS, AIMS and Fall Risk screenings as ordered, Perform wound care treatments as ordered.  Evaluation of Outcomes: Progressing   LCSW Treatment Plan for Primary Diagnosis: Bipolar affective disorder ([place]) Long Term Goal(s): Safe transition to appropriate next level of care at discharge, Engage patient in therapeutic group addressing interpersonal concerns.  Short Term Goals: Engage patient in aftercare planning with referrals and resources, Increase social support, Increase ability to appropriately verbalize feelings, Increase emotional regulation, Facilitate acceptance of mental health diagnosis and concerns, and Increase skills for wellness and recovery  Therapeutic Interventions:  Assess for all discharge needs, 1 to 1 time with Social worker, Explore available resources and support systems, Assess for adequacy in community support network, Educate family and significant other(s) on suicide prevention, Complete Psychosocial Assessment, Interpersonal group therapy.  Evaluation of Outcomes: Progressing   Progress in Treatment: Attending groups: Yes. Participating in groups: Yes. Taking medication as prescribed: Yes. Toleration medication: Yes. Family/Significant other contact made: No, will contact:  if given permission. Patient understands diagnosis: Yes. Discussing patient identified problems/goals with staff: Yes. Medical problems stabilized or resolved: Yes. Denies suicidal/homicidal ideation: Yes. Issues/concerns per patient self-inventory: No. Other: none.  New problem(s) identified: No, Describe:  none identified. 07/05/22 Update: No changes at this time. 07/11/22 Update: No changes at this time. 07/15/22 Update: No changes at this time. 07/20/22 Update: No changes at this time. 07/26/22 Update: No changes at this time.   New Short Term/Long Term Goal(s): detox, elimination of symptoms of psychosis, medication management for mood stabilization; elimination of SI thoughts; development of comprehensive mental wellness/sobriety plan. 07/05/22 Update: No changes at this time. 07/11/22 Update: No changes at this time. 07/15/22 Update: No changes at this time. 07/20/22 Update: No changes at this time. 07/26/22 Update: No changes at this time.   Patient Goals:  Pt declined to attend treatment team meeting. 07/05/22 Update: No changes at this time. 07/11/22 Update: No changes at this time. 07/15/22 Update: No changes at this time. 07/20/22 Update: No changes at this time. 07/26/22 Update: No changes at this time.  Discharge Plan or Barriers: CSW will assist pt with development of an appropriate aftercare/discharge plan. 07/05/22 Update: No changes at this time. 07/11/22 Update: No changes at  this time. 07/15/22 Update: No changes at this time. 07/20/22 Update: No changes at this time. 07/26/22 Update: No changes at this time.   Reason for Continuation of Hospitalization: Medication stabilization Other; describe psychosis   Estimated Length of Stay: 1-7 days 07/05/22 Update: No changes at this time. 07/11/22 Update: No changes at this time. 07/15/22 Update: TBD 07/20/22 Update: No changes at this time. 07/26/22 Update: No changes at this time.  Last 3 [place] Suicide Severity Risk Score: Flowsheet Row Admission (Current) from 06/29/2022 in [place] Most recent reading at 06/29/2022  9:15 PM ED from 06/29/2022 in [place] Most recent reading at 06/29/2022  8:08 AM ED from 06/08/2022 in [place] Most recent reading at 06/08/2022  8:57 PM  C-SSRS RISK CATEGORY No Risk No Risk No Risk       Last PHQ 2/9 Scores:    04/01/2022    1:26 PM  Depression screen PHQ 2/9  Decreased Interest 0  Down, Depressed, Hopeless 2  PHQ - 2 Score 2  Altered sleeping 3  Tired, decreased energy 3  Change in appetite 3  Feeling bad or failure about yourself  3  Trouble concentrating 3  Moving slowly or fidgety/restless 3  Suicidal thoughts 0  PHQ-9 Score 20  Difficult doing work/chores Somewhat difficult    Scribe for Treatment Team: Shirl Harris, LCSW 07/26/2022 8:55 AM

## 2022-07-27 LAB — SARS CORONAVIRUS 2 BY RT PCR: SARS Coronavirus 2 by RT PCR: NEGATIVE

## 2022-07-27 NOTE — BHH Counselor (Addendum)
After group pt approached CSW and stated that she would like some shelters contacted, passing the CSW a list of three she had printed out by staff. CSW agreed, stating he would reach out to them. No other concerns expressed. Contact ended without incident.   CSW reached out to these facilities with the following results:  [facility] [phone]): Number is no longer in service. And when it was googled the result was a provider in [place], [place].   [employer] [facility] [phone]): CSW was able to leave HIPPA compliant voicemail with contact information for follow through.   [facility] [employer] [phone]): Number is no longer in service. Google search revealed that this place is permanently closed.   CSW to update pt regarding contacts.   Chalmers Guest. Guerry Bruin, MSW, LCSW, [employer] 07/27/2022 3:15 PM

## 2022-07-27 NOTE — Plan of Care (Signed)
D: Patient alert and oriented. Patient denies pain. Patient denies anxiety and depression. Patient denies SI/HI/AVH. Patient remains isolative to room with exception to coming out for meals.   A: Patient did not have scheduled medication during this shift with exception to scheduled ensures. Support and encouragement provided to patient.  Q15 minute safety checks maintained.   R: Patient compliant with medication administration and treatment plan. No adverse drug reactions noted. Patient remains safe on the unit at this time.   Problem: Education: Goal: Knowledge of [facility] Education information/materials will improve Outcome: Progressing Goal: Verbalization of understanding the information provided will improve Outcome: Progressing   Problem: Health Behavior/Discharge Planning: Goal: Identification of resources available to assist in meeting health care needs will improve Outcome: Progressing Goal: Compliance with treatment plan for underlying cause of condition will improve Outcome: Progressing   Problem: Safety: Goal: Periods of time without injury will increase Outcome: Progressing

## 2022-07-27 NOTE — Progress Notes (Signed)
Patient more active this evening. In the dayroom interacting well with her peers.  She denies si  hi avh depression and anxiety. She is med compliant and took her meds without issue. Has some worries related to placement.  As it nears time for discharge she is still hoping for placement in shelter or rehab center. Support and encouragement offered along with some new leads on possible placement options.  Will continue to monitor with q15 minute safety checks.    C Butler-Nicholson, LPN

## 2022-07-27 NOTE — Progress Notes (Signed)
Recreation Therapy Notes    Date: 07/27/2022  Time: 10:05 am   Location: Courtyard       Behavioral response: N/A   Intervention Topic: Wellness    Discussion/Intervention: Patient refused to attend group.   Clinical Observations/Feedback:  Patient refused to attend group.    Asbury Hair LRT/CTRS        Sumiko Ceasar 07/27/2022 12:10 PM        Mandeep Ferch 07/27/2022 12:10 PM

## 2022-07-27 NOTE — Group Note (Signed)
[facility] LCSW Group Therapy Note   Group Date: 07/27/2022 Start Time: 1300 End Time: 1400  Type of Therapy/Topic:  Group Therapy:  Feelings about Diagnosis  Participation Level:  Active    Description of Group:    This group will allow patients to explore their thoughts and feelings about diagnoses they have received. Patients will be guided to explore their level of understanding and acceptance of these diagnoses. Facilitator will encourage patients to process their thoughts and feelings about the reactions of others to their diagnosis, and will guide patients in identifying ways to discuss their diagnosis with significant others in their lives. This group will be process-oriented, with patients participating in exploration of their own experiences as well as giving and receiving support and challenge from other group members.   Therapeutic Goals: 1. Patient will demonstrate understanding of diagnosis as evidence by identifying two or more symptoms of the disorder:  2. Patient will be able to express two feelings regarding the diagnosis 3. Patient will demonstrate ability to communicate their needs through discussion and/or role plays  Summary of Patient Progress: Patient was present for the entirety of the group process. She states that she has had many diagnoses over the years. However, she states that she agrees with her current diagnosis, sharing that the three medications that she is on are really helpful. She went on to talk about her needs for continued medication management and therapy/grief counseling. She appeared open and receptive to feedback/comments from both peers and facilitator.    Therapeutic Modalities:   Cognitive Behavioral Therapy Brief Therapy Feelings Identification    Shirl Harris, LCSW

## 2022-07-27 NOTE — Plan of Care (Signed)
  Problem: Education: Goal: Knowledge of General Education information will improve Description: Including pain rating scale, medication(s)/side effects and non-pharmacologic comfort measures Outcome: Progressing   Problem: Health Behavior/Discharge Planning: Goal: Ability to manage health-related needs will improve Outcome: Progressing   Problem: Clinical Measurements: Goal: Ability to maintain clinical measurements within normal limits will improve Outcome: Progressing Goal: Will remain free from infection Outcome: Progressing Goal: Diagnostic test results will improve Outcome: Progressing Goal: Respiratory complications will improve Outcome: Progressing Goal: Cardiovascular complication will be avoided Outcome: Progressing   Problem: Safety: Goal: Periods of time without injury will increase Outcome: Progressing   Problem: Physical Regulation: Goal: Ability to maintain clinical measurements within normal limits will improve Outcome: Progressing   Problem: Health Behavior/Discharge Planning: Goal: Identification of resources available to assist in meeting health care needs will improve Outcome: Progressing Goal: Compliance with treatment plan for underlying cause of condition will improve Outcome: Progressing   Problem: Coping: Goal: Ability to verbalize frustrations and anger appropriately will improve Outcome: Progressing Goal: Ability to demonstrate self-control will improve Outcome: Progressing   
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
## 2022-07-27 NOTE — Progress Notes (Signed)
   07/27/22 0200  Psych Admission Type (Psych Patients Only)  Admission Status Voluntary  Psychosocial Assessment  Patient Complaints None  Eye Contact Fair  Facial Expression Flat  Affect Appropriate to circumstance  Speech Logical/coherent  Interaction Minimal  Motor Activity Other (Comment) (WDL)  Appearance/Hygiene In scrubs  Behavior Characteristics Cooperative  Mood Pleasant  Thought Process  Coherency WDL  Content WDL  Delusions None reported or observed  Perception WDL  Hallucination None reported or observed  Judgment WDL  Confusion None  Danger to Self  Current suicidal ideation? Denies  Danger to Others  Danger to Others None reported or observed
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
## 2022-07-28 NOTE — Progress Notes (Signed)
Pt denies SI/HI/AVH and verbally agrees to approach staff if these become apparent or before harming themselves/others. Rates depression 0/10. Rates anxiety 0/10. Rates pain 0/10. Pt has been in her room most of the day. Pt wanted her clothes washed because she is possibly getting d/c tomorrow and seemed to be happy about that. Though the pt has had a flat affect throughout the day. Scheduled medications administered to pt, per MD orders. RN provided support and encouragement to pt. Q15 min safety checks implemented and continued. Pt safe on the unit. RN will continue to monitor and intervene as needed.  Problem: Clinical Measurements: Goal: Will remain free from infection Outcome: Progressing   Problem: Nutrition: Goal: Adequate nutrition will be maintained Outcome: Progressing    07/28/22 0740  Psych Admission Type (Psych Patients Only)  Admission Status Voluntary  Psychosocial Assessment  Patient Complaints None  Eye Contact Fair  Facial Expression Flat;Sad  Affect Appropriate to circumstance;Flat  Speech Logical/coherent  Interaction Assertive  Motor Activity Slow  Appearance/Hygiene In scrubs;Disheveled  Behavior Characteristics Cooperative;Appropriate to situation;Calm  Mood Pleasant  Aggressive Behavior  Effect No apparent injury  Thought Process  Coherency WDL  Content WDL  Delusions None reported or observed  Perception WDL  Hallucination None reported or observed  Judgment WDL  Confusion None  Danger to Self  Current suicidal ideation? Denies  Danger to Others  Danger to Others None reported or observed

## 2022-07-28 NOTE — Group Note (Signed)
[facility] LCSW Group Therapy Note   Group Date: 07/28/2022 Start Time: 1300 End Time: 1400   Type of Therapy/Topic:  Group Therapy:  Emotion Regulation  Participation Level:  Did Not Attend    Description of Group:    The purpose of this group is to assist patients in learning to regulate negative emotions and experience positive emotions. Patients will be guided to discuss ways in which they have been vulnerable to their negative emotions. These vulnerabilities will be juxtaposed with experiences of positive emotions or situations, and patients challenged to use positive emotions to combat negative ones. Special emphasis will be placed on coping with negative emotions in conflict situations, and patients will process healthy conflict resolution skills.  Therapeutic Goals: Patient will identify two positive emotions or experiences to reflect on in order to balance out negative emotions:  Patient will label two or more emotions that they find the most difficult to experience:  Patient will be able to demonstrate positive conflict resolution skills through discussion or role plays:   Summary of Patient Progress: X   Therapeutic Modalities:   Cognitive Behavioral Therapy Feelings Identification Dialectical Behavioral Therapy   Shirl Harris, LCSW

## 2022-07-28 NOTE — Progress Notes (Signed)
[facility] MD Progress Note  07/28/2022 1:55 PM Autumn [name]  MRN:  [id number] Subjective: Patient seen.  No new complaints.  Still stays in bed most of the time with anxiety.  Denies being depressed however denies suicidal thoughts does not appear psychotic.  Still very lackadaisical about making contact for discharge Principal Problem: Bipolar affective disorder ([place]) Diagnosis: Principal Problem:   Bipolar affective disorder ([place])  Total Time spent with patient: 20 minutes  Past Psychiatric History: Past history of bipolar disorder  Past Medical History:  Past Medical History:  Diagnosis Date   Allergy    Seasonal, Ultram (seizures)   Anemia 2005   Bipolar 1 disorder ([place])    Glaucoma    Seizures ([place]) 2008    Past Surgical History:  Procedure Laterality Date   BREAST EXCISIONAL BIOPSY Right    x 2   CESAREAN SECTION     x 2   LEEP  2000   Family History:  Family History  Problem Relation Age of Onset   Hypertension Mother    Hyperlipidemia Mother    Asthma Mother    Parkinson's disease Father    Lung cancer Maternal Grandmother    Other Paternal Grandfather 59       Cardiac arrest   Heart disease Paternal Grandfather    Tourette syndrome Daughter    Family Psychiatric  History: See previous Social History:  Social History   Substance and Sexual Activity  Alcohol Use Not Currently   Alcohol/week: 8.0 standard drinks of alcohol   Types: 8 Shots of liquor per week   Comment: last use 03/30/22 4x/yr     Social History   Substance and Sexual Activity  Drug Use Yes   Frequency: 1.0 times per week   Types: Marijuana, Cocaine, Heroin, Methamphetamines, MDMA (Ecstacy)   Comment: last use yesterday    Social History   Socioeconomic History   Marital status: Legally Separated    Spouse name: Not on file   Number of children: Not on file   Years of education: Not on file   Highest education level: Not on file  Occupational History   Not on file  Tobacco Use    Smoking status: Every Day    Packs/day: 0.50    Years: 9.00    Total pack years: 4.50    Types: E-cigarettes, Cigarettes, Cigars   Smokeless tobacco: Former    Types: Nurse, children's Use: Some days   Substances: Nicotine  Substance and Sexual Activity   Alcohol use: Not Currently    Alcohol/week: 8.0 standard drinks of alcohol    Types: 8 Shots of liquor per week    Comment: last use 03/30/22 4x/yr   Drug use: Yes    Frequency: 1.0 times per week    Types: Marijuana, Cocaine, Heroin, Methamphetamines, MDMA (Ecstacy)    Comment: last use yesterday   Sexual activity: Yes    Partners: Male    Birth control/protection: None  Other Topics Concern   Not on file  Social History Narrative   Not on file   Social Determinants of Health   Financial Resource Strain: Not on file  Food Insecurity: Not on file  Transportation Needs: Not on file  Physical Activity: Not on file  Stress: Not on file  Social Connections: Not on file   Additional Social History:  Sleep: Fair  Appetite:  Fair  Current Medications: Current Facility-Administered Medications  Medication Dose Route Frequency Provider Last Rate Last Admin   acetaminophen (TYLENOL) tablet 650 mg  650 mg Oral Q6H PRN Waldon Merl F, NP   650 mg at 07/27/22 1204   alum & mag hydroxide-simeth (MAALOX/MYLANTA) 200-200-20 MG/5ML suspension 30 mL  30 mL Oral Q4H PRN Waldon Merl F, NP       ARIPiprazole (ABILIFY) tablet 15 mg  15 mg Oral QHS Johni Narine T, MD   15 mg at 07/27/22 2054   divalproex (DEPAKOTE) DR tablet 1,250 mg  1,250 mg Oral QHS Kourtnee Lahey T, MD   1,250 mg at 07/27/22 2054   feeding supplement (ENSURE ENLIVE / ENSURE PLUS) liquid 237 mL  237 mL Oral BID BM Shaquera Ansley T, MD   237 mL at 07/28/22 1351   hydrOXYzine (ATARAX) tablet 25 mg  25 mg Oral TID PRN Sherlon Handing, NP   25 mg at 07/27/22 2053   lithium carbonate (ESKALITH) CR tablet 900 mg  900 mg  Oral QHS Tylah Mancillas T, MD   900 mg at 07/27/22 2054   magnesium hydroxide (MILK OF MAGNESIA) suspension 30 mL  30 mL Oral Daily PRN Waldon Merl F, NP   30 mL at 07/27/22 1829   nicotine polacrilex (NICORETTE) gum 2 mg  2 mg Oral PRN Luara Faye, [name], MD   2 mg at 07/27/22 1405   QUEtiapine (SEROQUEL) tablet 300 mg  300 mg Oral QHS Parks Ranger, DO   300 mg at 07/27/22 2053   senna-docusate (Senokot-S) tablet 2 tablet  2 tablet Oral Daily PRN Chanya Chrisley, [name], MD        Lab Results:  Results for orders placed or performed during the hospital encounter of 06/29/22 (from the past 48 hour(s))  SARS Coronavirus 2 by RT PCR (hospital order, performed in [facility] hospital lab) *cepheid single result test* Anterior Nasal Swab     Status: None   Collection Time: 07/27/22  9:04 AM   Specimen: Anterior Nasal Swab  Result Value Ref Range   SARS Coronavirus 2 by RT PCR NEGATIVE NEGATIVE    Comment: (NOTE) SARS-CoV-2 target nucleic acids are NOT DETECTED.  The SARS-CoV-2 RNA is generally detectable in upper and lower respiratory specimens during the acute phase of infection. The lowest concentration of SARS-CoV-2 viral copies this assay can detect is 250 copies / mL. A negative result does not preclude SARS-CoV-2 infection and should not be used as the sole basis for treatment or other patient management decisions.  A negative result may occur with improper specimen collection / handling, submission of specimen other than nasopharyngeal swab, presence of viral mutation(s) within the areas targeted by this assay, and inadequate number of viral copies (<250 copies / mL). A negative result must be combined with clinical observations, patient history, and epidemiological information.  Fact Sheet for Patients:   https://www.patel.info/  Fact Sheet for Healthcare Providers: https://hall.com/  This test is not yet approved or  cleared by the  [place] FDA and has been authorized for detection and/or diagnosis of SARS-CoV-2 by FDA under an Emergency Use Authorization (EUA).  This EUA will remain in effect (meaning this test can be used) for the duration of the COVID-19 declaration under Section 564(b)(1) of the Act, 21 U.S.C. section 360bbb-3(b)(1), unless the authorization is terminated or revoked sooner.  Performed at [facility], [address] [place], [place] [address]  Blood Alcohol level:  Lab Results  Component Value Date   ETH <10 06/29/2022   ETH <10 44/11/270    Metabolic Disorder Labs: Lab Results  Component Value Date   HGBA1C 4.9 12/25/2021   MPG 93.93 12/25/2021   MPG 114 06/02/2021   No results found for: "PROLACTIN" Lab Results  Component Value Date   CHOL 160 12/25/2021   TRIG 95 12/25/2021   HDL 58 12/25/2021   CHOLHDL 2.8 12/25/2021   VLDL 19 12/25/2021   LDLCALC 83 12/25/2021   LDLCALC 63 06/01/2021    Physical Findings: AIMS: Facial and Oral Movements Muscles of Facial Expression: None, normal Lips and Perioral Area: None, normal Jaw: None, normal Tongue: None, normal,Extremity Movements Upper (arms, wrists, hands, fingers): None, normal Lower (legs, knees, ankles, toes): None, normal, Trunk Movements Neck, shoulders, hips: None, normal, Overall Severity Severity of abnormal movements (highest score from questions above): None, normal Incapacitation due to abnormal movements: None, normal Patient's awareness of abnormal movements (rate only patient's report): No Awareness, Dental Status Current problems with teeth and/or dentures?: No Does patient usually wear dentures?: No  CIWA:    COWS:     Musculoskeletal: Strength & Muscle Tone: within normal limits Gait & Station: normal Patient leans: N/A  Psychiatric Specialty Exam:  Presentation  General Appearance: Casual; Neat  Eye Contact:Fair  Speech:Slow  Speech  Volume:Decreased  Handedness:Right   Mood and Affect  Mood:Depressed  Affect:Flat   Thought Process  Thought Processes:Coherent; Goal Directed  Descriptions of Associations:Circumstantial  Orientation:Full (Time, Place and Person)  Thought Content:Logical  History of Schizophrenia/Schizoaffective disorder:Yes  Duration of Psychotic Symptoms:Greater than six months  Hallucinations:No data recorded Ideas of Reference:None  Suicidal Thoughts:No data recorded Homicidal Thoughts:No data recorded  Sensorium  Memory:Immediate Fair; Immediate Good  Judgment:Fair  Insight:Fair   Executive Functions  Concentration:Fair  Attention Span:Fair  [place]   Psychomotor Activity  Psychomotor Activity:No data recorded  Assets  Assets:Communication Skills; Desire for Improvement   Sleep  Sleep:No data recorded   Physical Exam: Physical Exam Vitals and nursing note reviewed.  Constitutional:      Appearance: Normal appearance.  HENT:     Head: Normocephalic and atraumatic.     Mouth/Throat:     Pharynx: Oropharynx is clear.  Eyes:     Pupils: Pupils are equal, round, and reactive to light.  Cardiovascular:     Rate and Rhythm: Normal rate and regular rhythm.  Pulmonary:     Effort: Pulmonary effort is normal.     Breath sounds: Normal breath sounds.  Abdominal:     General: Abdomen is flat.     Palpations: Abdomen is soft.  Musculoskeletal:        General: Normal range of motion.  Skin:    General: Skin is warm and dry.  Neurological:     General: No focal deficit present.     Mental Status: She is alert. Mental status is at baseline.  Psychiatric:        Mood and Affect: Mood normal.        Thought Content: Thought content normal.    Review of Systems  Constitutional: Negative.   HENT: Negative.    Eyes: Negative.   Respiratory: Negative.    Cardiovascular: Negative.   Gastrointestinal: Negative.    Musculoskeletal: Negative.   Skin: Negative.   Neurological: Negative.   Psychiatric/Behavioral: Negative.     Blood pressure 94/69, pulse (!) 55, temperature 97.6 F (36.4 C),  temperature source Oral, resp. rate 18, height '5\' 1"'$  (1.549 m), weight 45 kg, last menstrual period 05/17/2022, SpO2 100 %. Body mass index is 18.74 kg/m.   Treatment Plan Summary: Daily contact with patient to assess and evaluate symptoms and progress in treatment, Medication management, and Plan patient is stable and really could benefit from discharge but unfortunately has no resources at all.  We continue to work with her on scanning for any kind of living situation  Alethia Berthold, MD 07/28/2022, 1:55 PM

## 2022-07-29 ENCOUNTER — Other Ambulatory Visit: Payer: Self-pay

## 2022-07-29 MED ORDER — ARIPIPRAZOLE 15 MG PO TABS
15.0000 mg | ORAL_TABLET | Freq: Every day | ORAL | 0 refills | Status: DC
  Filled 2022-07-29: qty 30, 30d supply, fill #0

## 2022-07-29 MED ORDER — ARIPIPRAZOLE 15 MG PO TABS: 15.0000 mg | ORAL_TABLET | Freq: Every day | ORAL | 2 refills | Status: DC

## 2022-07-29 MED ORDER — DIVALPROEX SODIUM 250 MG PO DR TAB
1250.0000 mg | DELAYED_RELEASE_TABLET | Freq: Every day | ORAL | 0 refills | Status: DC
  Filled 2022-07-29: qty 150, 30d supply, fill #0

## 2022-07-29 MED ORDER — HYDROXYZINE HCL 25 MG PO TABS
25.0000 mg | ORAL_TABLET | Freq: Three times a day (TID) | ORAL | 0 refills | Status: DC | PRN
  Filled 2022-07-29: qty 60, 20d supply, fill #0

## 2022-07-29 MED ORDER — QUETIAPINE FUMARATE 300 MG PO TABS: 300.0000 mg | ORAL_TABLET | Freq: Every day | ORAL | 2 refills | Status: DC

## 2022-07-29 MED ORDER — LITHIUM CARBONATE ER 450 MG PO TBCR
900.0000 mg | EXTENDED_RELEASE_TABLET | Freq: Every day | ORAL | 0 refills | Status: DC
  Filled 2022-07-29: qty 60, 30d supply, fill #0

## 2022-07-29 MED ORDER — DIVALPROEX SODIUM 250 MG PO DR TAB: 1250.0000 mg | DELAYED_RELEASE_TABLET | Freq: Every day | ORAL | 2 refills | Status: DC

## 2022-07-29 MED ORDER — LITHIUM CARBONATE ER 450 MG PO TBCR: 900.0000 mg | EXTENDED_RELEASE_TABLET | Freq: Every day | ORAL | 2 refills | Status: DC

## 2022-07-29 MED ORDER — NICOTINE POLACRILEX 2 MG MT GUM
2.0000 mg | CHEWING_GUM | OROMUCOSAL | 0 refills | Status: DC | PRN
  Filled 2022-07-29: qty 50, 10d supply, fill #0

## 2022-07-29 MED ORDER — NICOTINE POLACRILEX 2 MG MT GUM: 2.0000 mg | CHEWING_GUM | OROMUCOSAL | 2 refills | Status: DC | PRN

## 2022-07-29 MED ORDER — QUETIAPINE FUMARATE 300 MG PO TABS
300.0000 mg | ORAL_TABLET | Freq: Every day | ORAL | 0 refills | Status: DC
  Filled 2022-07-29: qty 30, 30d supply, fill #0

## 2022-07-29 MED ORDER — HYDROXYZINE HCL 25 MG PO TABS: 25.0000 mg | ORAL_TABLET | Freq: Three times a day (TID) | ORAL | 2 refills | Status: DC | PRN

## 2022-07-29 NOTE — Progress Notes (Signed)
Pt denies SI/HI/AVH and verbally agrees to approach staff if these become apparent or before harming themselves/others. Rates depression 0/10. Rates anxiety 0/10. Rates pain 0/10. Pt has been out of her room a little bit but has had no complaints and is ready to leave. Scheduled medications administered to pt, per MD orders. RN provided support and encouragement to pt. Q15 min safety checks implemented and continued. Pt safe on the unit. RN will continue to monitor and intervene as needed.  Problem: Coping: Goal: Level of anxiety will decrease Outcome: Progressing   Problem: Education: Goal: Mental status will improve Outcome: Progressing  07/29/22 0938  Psych Admission Type (Psych Patients Only)  Admission Status Voluntary  Psychosocial Assessment  Patient Complaints None  Eye Contact Fair  Facial Expression Sad  Affect Appropriate to circumstance  Speech Logical/coherent  Interaction Assertive  Motor Activity Slow  Appearance/Hygiene In scrubs  Behavior Characteristics Cooperative;Appropriate to situation;Calm  Mood Sad;Pleasant  Aggressive Behavior  Effect No apparent injury  Thought Process  Coherency WDL  Content WDL  Delusions None reported or observed  Perception WDL  Hallucination None reported or observed  Judgment WDL  Confusion None  Danger to Self  Current suicidal ideation? Denies  Danger to Others  Danger to Others None reported or observed

## 2022-07-29 NOTE — BHH Group Notes (Signed)
[facility] Group Notes:  (Nursing/MHT/Case Management/Adjunct)  Date:  07/29/2022  Time:  9:51 AM  Type of Therapy:   Community Meeting  Participation Level:  Did Not Attend   Adela Lank [facility] 07/29/2022, 9:51 AM

## 2022-07-29 NOTE — Progress Notes (Signed)
[facility] MD Progress Note  07/29/2022 2:14 PM Autumn [name]  MRN:  [id number] Subjective: Patient seen for follow-up.  46 year old woman with schizoaffective disorder or bipolar disorder and substance use problems.  No new symptoms today.  Mood is stable.  Denies suicidal or homicidal ideation.  Denies psychotic symptoms.  Has been cooperating with social work and treatment team on discharge planning and understands that we have a placement potentially available tomorrow. Principal Problem: Bipolar affective disorder ([place]) Diagnosis: Principal Problem:   Bipolar affective disorder ([place])  Total Time spent with patient: 30 minutes  Past Psychiatric History: Past history of bipolar disorder and substance use problems  Past Medical History:  Past Medical History:  Diagnosis Date   Allergy    Seasonal, Ultram (seizures)   Anemia 2005   Bipolar 1 disorder ([place])    Glaucoma    Seizures ([place]) 2008    Past Surgical History:  Procedure Laterality Date   BREAST EXCISIONAL BIOPSY Right    x 2   CESAREAN SECTION     x 2   LEEP  2000   Family History:  Family History  Problem Relation Age of Onset   Hypertension Mother    Hyperlipidemia Mother    Asthma Mother    Parkinson's disease Father    Lung cancer Maternal Grandmother    Other Paternal Grandfather 33       Cardiac arrest   Heart disease Paternal Grandfather    Tourette syndrome Daughter    Family Psychiatric  History: See previous Social History:  Social History   Substance and Sexual Activity  Alcohol Use Not Currently   Alcohol/week: 8.0 standard drinks of alcohol   Types: 8 Shots of liquor per week   Comment: last use 03/30/22 4x/yr     Social History   Substance and Sexual Activity  Drug Use Yes   Frequency: 1.0 times per week   Types: Marijuana, Cocaine, Heroin, Methamphetamines, MDMA (Ecstacy)   Comment: last use yesterday    Social History   Socioeconomic History   Marital status: Legally Separated     Spouse name: Not on file   Number of children: Not on file   Years of education: Not on file   Highest education level: Not on file  Occupational History   Not on file  Tobacco Use   Smoking status: Every Day    Packs/day: 0.50    Years: 9.00    Total pack years: 4.50    Types: E-cigarettes, Cigarettes, Cigars   Smokeless tobacco: Former    Types: Nurse, children's Use: Some days   Substances: Nicotine  Substance and Sexual Activity   Alcohol use: Not Currently    Alcohol/week: 8.0 standard drinks of alcohol    Types: 8 Shots of liquor per week    Comment: last use 03/30/22 4x/yr   Drug use: Yes    Frequency: 1.0 times per week    Types: Marijuana, Cocaine, Heroin, Methamphetamines, MDMA (Ecstacy)    Comment: last use yesterday   Sexual activity: Yes    Partners: Male    Birth control/protection: None  Other Topics Concern   Not on file  Social History Narrative   Not on file   Social Determinants of Health   Financial Resource Strain: Not on file  Food Insecurity: Not on file  Transportation Needs: Not on file  Physical Activity: Not on file  Stress: Not on file  Social Connections: Not on file   Additional  Social History:                         Sleep: Fair  Appetite:  Fair  Current Medications: Current Facility-Administered Medications  Medication Dose Route Frequency Provider Last Rate Last Admin   acetaminophen (TYLENOL) tablet 650 mg  650 mg Oral Q6H PRN Waldon Merl F, NP   650 mg at 07/27/22 1204   alum & mag hydroxide-simeth (MAALOX/MYLANTA) 200-200-20 MG/5ML suspension 30 mL  30 mL Oral Q4H PRN Waldon Merl F, NP       ARIPiprazole (ABILIFY) tablet 15 mg  15 mg Oral QHS Hadessah Grennan T, MD   15 mg at 07/28/22 2054   divalproex (DEPAKOTE) DR tablet 1,250 mg  1,250 mg Oral QHS Moxon Messler T, MD   1,250 mg at 07/28/22 2054   feeding supplement (ENSURE ENLIVE / ENSURE PLUS) liquid 237 mL  237 mL Oral BID BM Kimmy Totten T, MD    237 mL at 07/29/22 1317   hydrOXYzine (ATARAX) tablet 25 mg  25 mg Oral TID PRN Sherlon Handing, NP   25 mg at 07/28/22 2055   lithium carbonate (ESKALITH) CR tablet 900 mg  900 mg Oral QHS Zyria Fiscus T, MD   900 mg at 07/28/22 2053   magnesium hydroxide (MILK OF MAGNESIA) suspension 30 mL  30 mL Oral Daily PRN Waldon Merl F, NP   30 mL at 07/27/22 3295   nicotine polacrilex (NICORETTE) gum 2 mg  2 mg Oral PRN Galadriel Shroff, [name], MD   2 mg at 07/28/22 1656   QUEtiapine (SEROQUEL) tablet 300 mg  300 mg Oral QHS Parks Ranger, DO   300 mg at 07/28/22 2053   senna-docusate (Senokot-S) tablet 2 tablet  2 tablet Oral Daily PRN Lubna Stegeman, [name], MD        Lab Results: No results found for this or any previous visit (from the past 48 hour(s)).  Blood Alcohol level:  Lab Results  Component Value Date   ETH <10 06/29/2022   ETH <10 18/84/1660    Metabolic Disorder Labs: Lab Results  Component Value Date   HGBA1C 4.9 12/25/2021   MPG 93.93 12/25/2021   MPG 114 06/02/2021   No results found for: "PROLACTIN" Lab Results  Component Value Date   CHOL 160 12/25/2021   TRIG 95 12/25/2021   HDL 58 12/25/2021   CHOLHDL 2.8 12/25/2021   VLDL 19 12/25/2021   LDLCALC 83 12/25/2021   LDLCALC 63 06/01/2021    Physical Findings: AIMS: Facial and Oral Movements Muscles of Facial Expression: None, normal Lips and Perioral Area: None, normal Jaw: None, normal Tongue: None, normal,Extremity Movements Upper (arms, wrists, hands, fingers): None, normal Lower (legs, knees, ankles, toes): None, normal, Trunk Movements Neck, shoulders, hips: None, normal, Overall Severity Severity of abnormal movements (highest score from questions above): None, normal Incapacitation due to abnormal movements: None, normal Patient's awareness of abnormal movements (rate only patient's report): No Awareness, Dental Status Current problems with teeth and/or dentures?: No Does patient usually wear  dentures?: No  CIWA:    COWS:     Musculoskeletal: Strength & Muscle Tone: within normal limits Gait & Station: normal Patient leans: N/A  Psychiatric Specialty Exam:  Presentation  General Appearance: Casual; Neat  Eye Contact:Fair  Speech:Slow  Speech Volume:Decreased  Handedness:Right   Mood and Affect  Mood:Depressed  Affect:Flat   Thought Process  Thought Processes:Coherent; Goal Directed  Descriptions of Associations:Circumstantial  Orientation:Full (Time, Place and Person)  Thought Content:Logical  History of Schizophrenia/Schizoaffective disorder:Yes  Duration of Psychotic Symptoms:Greater than six months  Hallucinations:No data recorded Ideas of Reference:None  Suicidal Thoughts:No data recorded Homicidal Thoughts:No data recorded  Sensorium  Memory:Immediate Fair; Immediate Good  Judgment:Fair  Insight:Fair   Executive Functions  Concentration:Fair  Attention Span:Fair  [place]   Psychomotor Activity  Psychomotor Activity:No data recorded  Assets  Assets:Communication Skills; Desire for Improvement   Sleep  Sleep:No data recorded   Physical Exam: Physical Exam Vitals and nursing note reviewed.  Constitutional:      Appearance: Normal appearance.  HENT:     Head: Normocephalic and atraumatic.     Mouth/Throat:     Pharynx: Oropharynx is clear.  Eyes:     Pupils: Pupils are equal, round, and reactive to light.  Cardiovascular:     Rate and Rhythm: Normal rate and regular rhythm.  Pulmonary:     Effort: Pulmonary effort is normal.     Breath sounds: Normal breath sounds.  Abdominal:     General: Abdomen is flat.     Palpations: Abdomen is soft.  Musculoskeletal:        General: Normal range of motion.  Skin:    General: Skin is warm and dry.  Neurological:     General: No focal deficit present.     Mental Status: She is alert. Mental status is at baseline.   Psychiatric:        Attention and Perception: Attention normal.        Mood and Affect: Mood normal.        Speech: Speech normal.        Behavior: Behavior is cooperative.        Thought Content: Thought content normal.        Cognition and Memory: Cognition normal.    Review of Systems  Constitutional: Negative.   HENT: Negative.    Eyes: Negative.   Respiratory: Negative.    Cardiovascular: Negative.   Gastrointestinal: Negative.   Musculoskeletal: Negative.   Skin: Negative.   Neurological: Negative.   Psychiatric/Behavioral: Negative.     Blood pressure 97/66, pulse (!) 56, temperature 98.9 F (37.2 C), temperature source Oral, resp. rate 18, height '5\' 1"'$  (1.549 m), weight 45 kg, last menstrual period 05/17/2022, SpO2 100 %. Body mass index is 18.74 kg/m.   Treatment Plan Summary: Medication management and Plan no change to medication.  Reviewed plan with patient.  I am going to make sure we have a request for a 30-day supply of medicine and prescriptions available and start making plans for likely discharge tomorrow.  Alethia Berthold, MD 07/29/2022, 2:14 PM

## 2022-07-29 NOTE — BHH Counselor (Addendum)
CSW met with pt briefly per request. Pt stated that she was interested in a program. CSW encouraged pt to give him information on this program and he would work to figure out what needed to be done. Pt left and returned with information on [facility]. CSW explained that they would be contacted. She asked that she be updated regarding the contact. CSW agreed. No other concerns expressed. Contact ended without incident.   Chalmers Guest. Guerry Bruin, MSW, [place], [place] 07/29/2022 2:56 PM  CSW contacted [facility] [phone]). Contact was unable to be established with the intake department but HIPPA compliant voicemail left with contact information for follow through.   CSW updated pt about contact. CSW provided information about past interactions with program. She voiced understanding. Pt and CSW discussed consents and need for a long sleeve shirt and shoes. She agreed to complete consent and asked that her mother be contacted as well. CSW agreed. Consents and paperwork for clothing closet request were completed. No other concerns expressed. Contact ended without incident.   Chalmers Guest. Guerry Bruin, MSW, [place], [place] 07/29/2022 3:57 PM

## 2022-07-29 NOTE — BHH Suicide Risk Assessment (Signed)
[place] INPATIENT:  Family/Significant Other Suicide Prevention Education  Suicide Prevention Education:  Contact Attempts: Caren Griffins Ball/mother [phone]), has been identified by the patient as the family member/significant other with whom the patient will be residing, and identified as the person(s) who will aid the patient in the event of a mental health crisis.  With written consent from the patient, two attempts were made to provide suicide prevention education, prior to and/or following the patient's discharge.  We were unsuccessful in providing suicide prevention education.  A suicide education pamphlet was given to the patient to share with family/significant other.  Date and time of first attempt: 07/29/22 at 15:50 Date and time of second attempt:  Second attempt needed  Shirl Harris 07/29/2022, 3:50 PM
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
## 2022-07-29 NOTE — Group Note (Signed)
[facility] LCSW Group Therapy Note   Group Date: 07/29/2022 Start Time: 1300 End Time: 1400   Type of Therapy/Topic:  Group Therapy:  Balance in Life  Participation Level:  Minimal   Description of Group:    This group will address the concept of balance and how it feels and looks when one is unbalanced. Patients will be encouraged to process areas in their lives that are out of balance, and identify reasons for remaining unbalanced. Facilitators will guide patients utilizing problem- solving interventions to address and correct the stressor making their life unbalanced. Understanding and applying boundaries will be explored and addressed for obtaining  and maintaining a balanced life. Patients will be encouraged to explore ways to assertively make their unbalanced needs known to significant others in their lives, using other group members and facilitator for support and feedback.  Therapeutic Goals: Patient will identify two or more emotions or situations they have that consume much of in their lives. Patient will identify signs/triggers that life has become out of balance:  Patient will identify two ways to set boundaries in order to achieve balance in their lives:  Patient will demonstrate ability to communicate their needs through discussion and/or role plays  Summary of Patient Progress: Patient was present for the entirety of the group process. She stated that she struggles when her social life is off balanced. Her comments were pertinent to the discussion. Pt was open and receptive to comments/feedback from both peers and facilitator.    Therapeutic Modalities:   Cognitive Behavioral Therapy Solution-Focused Therapy Assertiveness Training   Shirl Harris, LCSW

## 2022-07-29 NOTE — Progress Notes (Signed)
Patient upset and crying. Anxious and agitated. Staff tried consoling and questioned why she was crying.  Patient reported that her close friend of 91 years had just died of Fentanyl overdose.  Patient asking for her night medication to help calm her down so that she can sleep.  Received her medication without incident.  She currently denies si  hi  avh  and pain.  Is endorsing depression and anxiety in lieu of recent news of friends death.  Support and encouragement offered.  Will continue to monitor with q15 minute safety checks.      C Butler-Nicholson, LPN

## 2022-07-29 NOTE — Progress Notes (Signed)
Recreation Therapy Notes  Date: 07/29/2022  Time: 10:40 am   Location: Craft room    Behavioral response: N/A   Intervention Topic: Happiness   Discussion/Intervention: Patient refused to attend group.   Clinical Observations/Feedback:  Patient refused to attend group.   Amaira Safley LRT/CTRS         Cleland Simkins 07/29/2022 12:52 PM

## 2022-07-29 NOTE — Progress Notes (Signed)
Patient provided with information for [facility] substance use treatment services. Website says they are now excepting applications.  Advised to let social work know so that they can help her with the application process.     C Butler-Nicholson, LPN

## 2022-07-30 ENCOUNTER — Other Ambulatory Visit: Payer: Self-pay

## 2022-07-30 MED ORDER — QUETIAPINE FUMARATE 300 MG PO TABS: 300.0000 mg | ORAL_TABLET | Freq: Every day | ORAL | 2 refills | Status: DC

## 2022-07-30 MED ORDER — LITHIUM CARBONATE ER 450 MG PO TBCR: 900.0000 mg | EXTENDED_RELEASE_TABLET | Freq: Every day | ORAL | 2 refills | Status: DC

## 2022-07-30 MED ORDER — DIVALPROEX SODIUM 250 MG PO DR TAB: 1250.0000 mg | DELAYED_RELEASE_TABLET | Freq: Every day | ORAL | 2 refills | Status: DC

## 2022-07-30 MED ORDER — NICOTINE POLACRILEX 2 MG MT GUM: 2.0000 mg | CHEWING_GUM | OROMUCOSAL | 2 refills | Status: DC | PRN

## 2022-07-30 MED ORDER — ARIPIPRAZOLE 15 MG PO TABS: 15.0000 mg | ORAL_TABLET | Freq: Every day | ORAL | 2 refills | Status: DC

## 2022-07-30 MED ORDER — HYDROXYZINE HCL 25 MG PO TABS: 25.0000 mg | ORAL_TABLET | Freq: Three times a day (TID) | ORAL | 2 refills | Status: DC | PRN

## 2022-07-30 NOTE — Progress Notes (Signed)
Recreation Therapy Notes  INPATIENT RECREATION TR PLAN  Patient Details Name: Autumn [name] MRN: [id number] DOB: [birth year] Today's Date: 07/30/2022  Rec Therapy Plan Is patient appropriate for Therapeutic Recreation?: Yes Treatment times per week: at least 3 Estimated Length of Stay: 5-7 days TR Treatment/Interventions: Group participation (Comment)  Discharge Criteria Pt will be discharged from therapy if:: Discharged Treatment plan/goals/alternatives discussed and agreed upon by:: Patient/family  Discharge Summary Short term goals set: Patient will engage in interactions with peers and staff in pro-social manner at least 2x within 5 recreation therapy group sessions Short term goals met: Not met Reason goals not met: Patient did not attend any groups Therapeutic equipment acquired: N/A Reason patient discharged from therapy: Discharge from hospital Pt/family agrees with progress & goals achieved: Yes Date patient discharged from therapy: 07/30/22   Manoj Enriquez 07/30/2022, 12:11 PM

## 2022-07-30 NOTE — Plan of Care (Signed)
  Problem: Group Participation Goal: STG - Patient will engage in interactions with peers and staff in pro-social manner at least 2x within 5 recreation therapy group sessions Description: STG - Patient will engage in interactions with peers and staff in pro-social manner at least 2x within 5 recreation therapy group sessions 07/30/2022 1209 by Ernest Haber, LRT Outcome: Not Applicable 8/34/6219 [id number] by Ernest Haber, LRT Outcome: Not Met (add Reason) Note: Patient did not attend any groups.

## 2022-07-30 NOTE — Discharge Summary (Signed)
Physician Discharge Summary Note  Patient:  Autumn [name] is an 46 y.o., female MRN:  [id number] DOB:  1976-05-27 Patient phone:  [phone] (home)  Patient address:   [address] [place] [id number],  Total Time spent with patient: 30 minutes  Date of Admission:  06/29/2022 Date of Discharge: 07/30/2022  Reason for Admission: Patient was admitted because of psychotic symptoms bizarre behavior poor self-care  Principal Problem: Bipolar affective disorder [facility]) Discharge Diagnoses: Principal Problem:   Bipolar affective disorder ([place])   Past Psychiatric History: Long history of bipolar or schizoaffective disorder complicated by substance use problems  Past Medical History:  Past Medical History:  Diagnosis Date   Allergy    Seasonal, Ultram (seizures)   Anemia 2005   Bipolar 1 disorder ([place])    Glaucoma    Seizures ([place]) 2008    Past Surgical History:  Procedure Laterality Date   BREAST EXCISIONAL BIOPSY Right    x 2   CESAREAN SECTION     x 2   LEEP  2000   Family History:  Family History  Problem Relation Age of Onset   Hypertension Mother    Hyperlipidemia Mother    Asthma Mother    Parkinson's disease Father    Lung cancer Maternal Grandmother    Other Paternal Grandfather 66       Cardiac arrest   Heart disease Paternal Grandfather    Tourette syndrome Daughter    Family Psychiatric  History: See previous Social History:  Social History   Substance and Sexual Activity  Alcohol Use Not Currently   Alcohol/week: 8.0 standard drinks of alcohol   Types: 8 Shots of liquor per week   Comment: last use 03/30/22 4x/yr     Social History   Substance and Sexual Activity  Drug Use Yes   Frequency: 1.0 times per week   Types: Marijuana, Cocaine, Heroin, Methamphetamines, MDMA (Ecstacy)   Comment: last use yesterday    Social History   Socioeconomic History   Marital status: Legally Separated    Spouse name: Not on file   Number of  children: Not on file   Years of education: Not on file   Highest education level: Not on file  Occupational History   Not on file  Tobacco Use   Smoking status: Every Day    Packs/day: 0.50    Years: 9.00    Total pack years: 4.50    Types: E-cigarettes, Cigarettes, Cigars   Smokeless tobacco: Former    Types: Nurse, children's Use: Some days   Substances: Nicotine  Substance and Sexual Activity   Alcohol use: Not Currently    Alcohol/week: 8.0 standard drinks of alcohol    Types: 8 Shots of liquor per week    Comment: last use 03/30/22 4x/yr   Drug use: Yes    Frequency: 1.0 times per week    Types: Marijuana, Cocaine, Heroin, Methamphetamines, MDMA (Ecstacy)    Comment: last use yesterday   Sexual activity: Yes    Partners: Male    Birth control/protection: None  Other Topics Concern   Not on file  Social History Narrative   Not on file   Social Determinants of Health   Financial Resource Strain: Not on file  Food Insecurity: Not on file  Transportation Needs: Not on file  Physical Activity: Not on file  Stress: Not on file  Social Connections: Not on file    Hospital Course: Admitted to psychiatric unit.  Initially very irritable disorganized paranoid.  Restarted on medicines consistent with prior treatment.  Tolerated medication well.  Gradually showed improvement back to her baseline where she no longer was having hallucinations.  Denied any suicidal thought.  Did not show any acting out on the unit.  Became more interactive with groups.  Physically more stable.  Patient is without any significant financial resources so placement is extremely difficult.  Ultimately some arrangement was made for a shelter not too far away.  Patient will be discharged there and encouraged to follow-up with local providers and is given a 30-day supply of medicine as well as prescriptions  Physical Findings: AIMS: Facial and Oral Movements Muscles of Facial Expression: None,  normal Lips and Perioral Area: None, normal Jaw: None, normal Tongue: None, normal,Extremity Movements Upper (arms, wrists, hands, fingers): None, normal Lower (legs, knees, ankles, toes): None, normal, Trunk Movements Neck, shoulders, hips: None, normal, Overall Severity Severity of abnormal movements (highest score from questions above): None, normal Incapacitation due to abnormal movements: None, normal Patient's awareness of abnormal movements (rate only patient's report): No Awareness, Dental Status Current problems with teeth and/or dentures?: No Does patient usually wear dentures?: No  CIWA:    COWS:     Musculoskeletal: Strength & Muscle Tone: within normal limits Gait & Station: normal Patient leans: N/A   Psychiatric Specialty Exam:  Presentation  General Appearance: Casual; Neat  Eye Contact:Fair  Speech:Slow  Speech Volume:Decreased  Handedness:Right   Mood and Affect  Mood:Depressed  Affect:Flat   Thought Process  Thought Processes:Coherent; Goal Directed  Descriptions of Associations:Circumstantial  Orientation:Full (Time, Place and Person)  Thought Content:Logical  History of Schizophrenia/Schizoaffective disorder:Yes  Duration of Psychotic Symptoms:Greater than six months  Hallucinations:No data recorded Ideas of Reference:None  Suicidal Thoughts:No data recorded Homicidal Thoughts:No data recorded  Sensorium  Memory:Immediate Fair; Immediate Good  Judgment:Fair  Insight:Fair   Executive Functions  Concentration:Fair  Attention Span:Fair  [place]   Psychomotor Activity  Psychomotor Activity:No data recorded  Assets  Assets:Communication Skills; Desire for Improvement   Sleep  Sleep:No data recorded   Physical Exam: Physical Exam Vitals and nursing note reviewed.  Constitutional:      Appearance: Normal appearance.  HENT:     Head: Normocephalic and atraumatic.      Mouth/Throat:     Pharynx: Oropharynx is clear.  Eyes:     Pupils: Pupils are equal, round, and reactive to light.  Cardiovascular:     Rate and Rhythm: Normal rate and regular rhythm.  Pulmonary:     Effort: Pulmonary effort is normal.     Breath sounds: Normal breath sounds.  Abdominal:     General: Abdomen is flat.     Palpations: Abdomen is soft.  Musculoskeletal:        General: Normal range of motion.  Skin:    General: Skin is warm and dry.  Neurological:     General: No focal deficit present.     Mental Status: She is alert. Mental status is at baseline.  Psychiatric:        Mood and Affect: Mood normal.        Thought Content: Thought content normal.    Review of Systems  Constitutional: Negative.   HENT: Negative.    Eyes: Negative.   Respiratory: Negative.    Cardiovascular: Negative.   Gastrointestinal: Negative.   Musculoskeletal: Negative.   Skin: Negative.   Neurological: Negative.   Psychiatric/Behavioral: Negative.  Blood pressure 91/69, pulse (!) 59, temperature 98.6 F (37 C), temperature source Oral, resp. rate 18, height '5\' 1"'$  (1.549 m), weight 45 kg, last menstrual period 05/17/2022, SpO2 100 %. Body mass index is 18.74 kg/m.   Social History   Tobacco Use  Smoking Status Every Day   Packs/day: 0.50   Years: 9.00   Total pack years: 4.50   Types: E-cigarettes, Cigarettes, Cigars  Smokeless Tobacco Former   Types: Chew   Tobacco Cessation:  A prescription for an FDA-approved tobacco cessation medication provided at discharge   Blood Alcohol level:  Lab Results  Component Value Date    [facility] <10 06/29/2022   ETH <10 62/94/7654    Metabolic Disorder Labs:  Lab Results  Component Value Date   HGBA1C 4.9 12/25/2021   MPG 93.93 12/25/2021   MPG 114 06/02/2021   No results found for: "PROLACTIN" Lab Results  Component Value Date   CHOL 160 12/25/2021   TRIG 95 12/25/2021   HDL 58 12/25/2021   CHOLHDL 2.8 12/25/2021   VLDL 19  12/25/2021   LDLCALC 83 12/25/2021   LDLCALC 63 06/01/2021    See Psychiatric Specialty Exam and Suicide Risk Assessment completed by Attending Physician prior to discharge.  Discharge destination:  Home  Is patient on multiple antipsychotic therapies at discharge:  Yes,   Do you recommend tapering to monotherapy for antipsychotics?  No   Has Patient had three or more failed trials of antipsychotic monotherapy by history:  Yes,   Antipsychotic medications that previously failed include:   1.  Seroquel., 2.  Risperdal., and 3.  Invega.  Recommended Plan for Multiple Antipsychotic Therapies: NA  Discharge Instructions     Diet - low sodium heart healthy   Complete by: As directed    Increase activity slowly   Complete by: As directed       Allergies as of 07/30/2022       Reactions   Haldol [haloperidol Lactate]    Patient states that she gets stiff muscles when taking Haldol   Ultram [tramadol] Other (See Comments)   Seizures   Ziprasidone Hcl    Other reaction(s): Other (See Comments) PER PT WHEN SHE TAKES THIS SHE CANT MOVE HER NECK        Medication List     STOP taking these medications    norethindrone 0.35 MG tablet Commonly known as: MICRONOR       TAKE these medications      Indication  ARIPiprazole 15 MG tablet Commonly known as: ABILIFY Take 1 tablet (15 mg total) by mouth at bedtime. What changed: when to take this  Indication: MIXED BIPOLAR AFFECTIVE DISORDER   divalproex 250 MG DR tablet Commonly known as: DEPAKOTE Take 5 tablets (1,250 mg total) by mouth at bedtime. What changed:  how much to take when to take this  Indication: Depressive Phase of Manic-Depression   hydrOXYzine 25 MG tablet Commonly known as: ATARAX Take 1 tablet (25 mg total) by mouth 3 (three) times daily as needed for anxiety.  Indication: Feeling Anxious   lithium carbonate 450 MG CR tablet Commonly known as: ESKALITH Take 2 tablets (900 mg total) by mouth at  bedtime. What changed:  medication strength how much to take when to take this  Indication: Schizoaffective Disorder   nicotine polacrilex 2 MG gum Commonly known as: NICORETTE Chew one piece of gum by mouth as directed on package as needed for smoking cessation.  Indication: Nicotine Addiction   QUEtiapine  300 MG tablet Commonly known as: SEROQUEL Take 1 tablet (300 mg total) by mouth at bedtime. What changed:  medication strength how much to take  Indication: Manic-Depression, Depressive Phase of [place] Follow up.   Specialty: Behavioral Health Why: They have walk-in hours on Mondays and Wednesdays from 8-11am for therapy and on Mondays, Wednesdays, Thursdays, and Fridays from 8-11am for psychiatry. It is recommended that you arrive by 7:30am to be seen that day. Thanks! Contact information: [place] [phone]                Follow-up recommendations:  Other:  Discharge with medications and prescriptions and recommendations for follow-up with outpatient mental health providers and strong encouragement to engage in substance abuse treatment and avoid substance use  Comments: See above  Signed: Alethia Berthold, MD 07/30/2022, 11:10 AM
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
## 2022-07-30 NOTE — Plan of Care (Signed)
  Problem: Education: Goal: Knowledge of General Education information will improve Description: Including pain rating scale, medication(s)/side effects and non-pharmacologic comfort measures Outcome: Progressing   Problem: Health Behavior/Discharge Planning: Goal: Ability to manage health-related needs will improve Outcome: Progressing   Problem: Clinical Measurements: Goal: Ability to maintain clinical measurements within normal limits will improve Outcome: Progressing Goal: Will remain free from infection Outcome: Progressing Goal: Diagnostic test results will improve Outcome: Progressing Goal: Respiratory complications will improve Outcome: Progressing Goal: Cardiovascular complication will be avoided Outcome: Progressing   Problem: Safety: Goal: Periods of time without injury will increase Outcome: Progressing   Problem: Physical Regulation: Goal: Ability to maintain clinical measurements within normal limits will improve Outcome: Progressing   Problem: Health Behavior/Discharge Planning: Goal: Identification of resources available to assist in meeting health care needs will improve Outcome: Progressing Goal: Compliance with treatment plan for underlying cause of condition will improve Outcome: Progressing   Problem: Coping: Goal: Ability to verbalize frustrations and anger appropriately will improve Outcome: Progressing Goal: Ability to demonstrate self-control will improve Outcome: Progressing   

## 2022-07-30 NOTE — Progress Notes (Signed)
Discharge note: RN met with pt and reviewed pt's discharge instructions. Pt verbalized understanding of discharge instructions and pt did not have any questions. RN reviewed and provided pt with a copy of SRA, AVS and Transition Record. RN returned pt's belongings to pt.  Prescriptions and samples were given to pt. Pt denied SI/HI/AVH and voiced no concerns. Pt was appreciative of the care pt received at [facility]. Patient discharged to the lobby without incident.  Problem: Education: Goal: Emotional status will improve Outcome: Adequate for Discharge Goal: Mental status will improve 07/30/2022 0935 by Gerrianne Scale, RN Outcome: Adequate for Discharge 07/30/2022 0935 by Gerrianne Scale, RN Outcome: Progressing Goal: Verbalization of understanding the information provided will improve Outcome: Adequate for Discharge    07/30/22 0845  Psych Admission Type (Psych Patients Only)  Admission Status Voluntary  Psychosocial Assessment  Patient Complaints None  Eye Contact Fair  Facial Expression Other (Comment) (WDL)  Affect Appropriate to circumstance  Speech Logical/coherent  Interaction Assertive  Motor Activity Other (Comment) (WDL)  Appearance/Hygiene In scrubs  Behavior Characteristics Appropriate to situation;Cooperative;Calm  Mood Pleasant  Thought Process  Coherency WDL  Content WDL  Delusions None reported or observed  Perception WDL  Hallucination None reported or observed  Judgment WDL  Confusion None  Danger to Self  Current suicidal ideation? Denies  Danger to Others  Danger to Others None reported or observed  Danger to Others Abnormal  Harmful Behavior to others No threats or harm toward other people  Destructive Behavior No threats or harm toward property

## 2022-07-30 NOTE — Progress Notes (Signed)
  [facility] Adult Case Management Discharge Plan :  Will you be returning to the same living situation after discharge:  No. At discharge, do you have transportation home?: Yes,  CSW to arrange transportation. Do you have the ability to pay for your medications: No.  Release of information consent forms completed and in the chart;  [name]  Patient to Follow up at:  [facility].   Specialty: Behavioral Health Why: They have walk-in hours on Mondays and Wednesdays from 8-11am for therapy and on Mondays, Wednesdays, Thursdays, and Fridays from 8-11am for psychiatry. It is recommended that you arrive by 7:30am to be seen that day. Thanks! Contact information: [place] [phone]                Next level of care provider has access to [place] and Suicide Prevention discussed: Yes,  attempts made to contact mother, Uvaldo Rising. SPE completed with pt.     Has patient been referred to the Quitline?: Patient refused referral  Patient has been referred for addiction treatment: Pt. refused referral  Shirl Harris, LCSW 07/30/2022, 2:27 PM

## 2022-07-30 NOTE — Progress Notes (Signed)
Recreation Therapy Notes    Date: 07/30/2022   Time: 10:40 am   Location: Court yard    Behavioral response: N/A   Intervention Topic: Leisure    Discussion/Intervention: Patient refused to attend group.   Clinical Observations/Feedback:  Patient refused to attend group.   Cordarrel Stiefel LRT/CTRS        Shawntelle Ungar 07/30/2022 11:58 AM

## 2022-07-30 NOTE — BHH Counselor (Signed)
CSW met with pt to discuss discharge briefly. She was informed that [facility] has not called back. CSW inquired if [facility] was still the plan for today. She agreed. Pt will need transportation assistance. She agreed to trying to schedule appointment through [facility]. She stated that she is a smoker but declined interest in cessation/referral to [facility]. Pt received clothes which had been requested for discharge yesterday. No other concerns expressed. Contact ended without incident.   CSW attempted to contact outpatient services through [facility]. Contact was unable to be established and HIPPA compliant voicemail left with contact information for follow through.   CSW also attempted to contact mother, Autumn [name] ([phone]) per pt request. Contact unable to be established and HIPPA compliant voicemail left with contact information for follow through.    R. , MSW, LCSW, LCAS 07/30/2022 11:08 AM  

## 2022-07-30 NOTE — BHH Suicide Risk Assessment (Addendum)
[place] INPATIENT:  Family/Significant Other Suicide Prevention Education  Suicide Prevention Education:  Contact Attempts: Caren Griffins Ball/mother [phone]), has been identified by the patient as the family member/significant other with whom the patient will be residing, and identified as the person(s) who will aid the patient in the event of a mental health crisis.  With written consent from the patient, two attempts were made to provide suicide prevention education, prior to and/or following the patient's discharge.  We were unsuccessful in providing suicide prevention education.  A suicide education pamphlet was given to the patient to share with family/significant other.  Date and time of first attempt: 07/29/22 at 15:50 Date and time of second attempt: 07/30/22 at 11:00.  Autumn [name] 07/30/2022, 11:00 AM

## 2022-08-02 ENCOUNTER — Encounter: Payer: Self-pay | Admitting: Emergency Medicine

## 2022-08-02 ENCOUNTER — Other Ambulatory Visit: Payer: Self-pay

## 2022-08-02 ENCOUNTER — Emergency Department
Admission: EM | Admit: 2022-08-02 | Discharge: 2022-08-02 | Disposition: A | Discharge status: Home / Self Care | Payer: Medicaid Other | Attending: Emergency Medicine | Admitting: Emergency Medicine

## 2022-08-02 DIAGNOSIS — K0889 Other specified disorders of teeth and supporting structures: Secondary | ICD-10-CM | POA: Insufficient documentation

## 2022-08-02 DIAGNOSIS — Z760 Encounter for issue of repeat prescription: Secondary | ICD-10-CM | POA: Insufficient documentation

## 2022-08-02 MED ORDER — NICOTINE POLACRILEX 2 MG MT GUM
2.0000 mg | CHEWING_GUM | OROMUCOSAL | 2 refills | Status: DC | PRN
  Filled 2022-09-02: qty 50, 7d supply, fill #0

## 2022-08-02 MED ORDER — QUETIAPINE FUMARATE 300 MG PO TABS
300.0000 mg | ORAL_TABLET | Freq: Every day | ORAL | 2 refills | Status: DC
  Filled 2022-08-04: qty 7, 7d supply, fill #0
  Filled 2022-08-04: qty 30, 30d supply, fill #0
  Filled 2022-08-16: qty 7, 7d supply, fill #1
  Filled 2022-08-25: qty 7, 7d supply, fill #2
  Filled 2022-09-02: qty 7, 7d supply, fill #3
  Filled 2022-09-20: qty 7, 7d supply, fill #4

## 2022-08-02 MED ORDER — ACETAMINOPHEN 500 MG PO TABS
1000.0000 mg | ORAL_TABLET | Freq: Once | ORAL | Status: AC
  Administered 2022-08-02: 1000 mg via ORAL
  Filled 2022-08-02: qty 2

## 2022-08-02 MED ORDER — HYDROXYZINE HCL 25 MG PO TABS
25.0000 mg | ORAL_TABLET | Freq: Three times a day (TID) | ORAL | 2 refills | Status: DC | PRN
  Filled 2022-08-04: qty 21, 7d supply, fill #0
  Filled 2022-08-04: qty 60, 20d supply, fill #0
  Filled 2022-08-16: qty 21, 7d supply, fill #1
  Filled 2022-08-25: qty 21, 7d supply, fill #2
  Filled 2022-09-02: qty 21, 7d supply, fill #3
  Filled 2022-09-20: qty 21, 7d supply, fill #4

## 2022-08-02 MED ORDER — ARIPIPRAZOLE 15 MG PO TABS
15.0000 mg | ORAL_TABLET | Freq: Every day | ORAL | 2 refills | Status: DC
  Filled 2022-08-04: qty 7, 7d supply, fill #0
  Filled 2022-08-04: qty 30, 30d supply, fill #0
  Filled 2022-08-16: qty 7, 7d supply, fill #1
  Filled 2022-08-25: qty 7, 7d supply, fill #2
  Filled 2022-09-02: qty 7, 7d supply, fill #3
  Filled 2022-09-20: qty 7, 7d supply, fill #4

## 2022-08-02 MED ORDER — DIVALPROEX SODIUM 250 MG PO DR TAB
1250.0000 mg | DELAYED_RELEASE_TABLET | Freq: Every day | ORAL | 2 refills | Status: DC
  Filled 2022-08-04: qty 35, 7d supply, fill #0
  Filled 2022-08-04: qty 150, 30d supply, fill #0
  Filled 2022-08-16: qty 35, 7d supply, fill #1
  Filled 2022-08-25: qty 35, 7d supply, fill #2
  Filled 2022-09-02: qty 35, 7d supply, fill #3
  Filled 2022-09-20: qty 35, 7d supply, fill #4

## 2022-08-02 MED ORDER — LITHIUM CARBONATE ER 450 MG PO TBCR
900.0000 mg | EXTENDED_RELEASE_TABLET | Freq: Every day | ORAL | 2 refills | Status: DC
  Filled 2022-08-04: qty 60, 30d supply, fill #0
  Filled 2022-08-04: qty 14, 7d supply, fill #0
  Filled 2022-08-16: qty 14, 7d supply, fill #1
  Filled 2022-08-25: qty 14, 7d supply, fill #2
  Filled 2022-09-02: qty 14, 7d supply, fill #3
  Filled 2022-09-20: qty 14, 7d supply, fill #4
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
## 2022-08-02 NOTE — Discharge Instructions (Signed)
I have sent your prescriptions to [facility].

## 2022-08-04 ENCOUNTER — Other Ambulatory Visit: Payer: Self-pay

## 2022-08-04 MED ORDER — LITHIUM CARBONATE ER 300 MG PO TBCR
EXTENDED_RELEASE_TABLET | ORAL | 0 refills | Status: DC
  Filled 2022-08-04: qty 120, 30d supply, fill #0

## 2022-08-04 MED ORDER — QUETIAPINE FUMARATE 200 MG PO TABS
ORAL_TABLET | ORAL | 0 refills | Status: DC
  Filled 2022-08-04: qty 30, 30d supply, fill #0

## 2022-08-04 NOTE — Congregational Nurse Program (Signed)
  Dept: [phone]   Congregational Nurse Program Note  Date of Encounter: 08/04/2022 Client to [facility] clinic with request for assistance with obtaining her medications. She was discharged from Behavioral health on 9/22 with a 7 day supply. She was seen in the [facility] ED on 9/25 and requested her refills be sent to [facility]. She does not have health insurance and cannot pay for her medications. RN contacted [facility] and  they are able to transfer her prescriptions. Discussed with Adonis Brook, pharmacy supervisor. Client will get a 14 day supply at a time. Client made aware and in agreement. She is currently living in a tent, with another participant, she reports she feels safe and plans to continue coming to [facility].  Past Medical History: Past Medical History:  Diagnosis Date   Allergy    Seasonal, Ultram (seizures)   Anemia 2005   Bipolar 1 disorder ([place])    Glaucoma    Seizures ([place]) 2008    Encounter Details:  CNP Questionnaire - 08/04/22 1145       Questionnaire   Do you give verbal consent to treat you today? Yes    Location Patient [place] or Organization    Patient Status Homeless    Insurance Uninsured ([place] Card/Care Connects/Self-Pay)    Insurance Referral N/A    Medication Have Medication Insecurities    Medical Provider No   client has been to [facility] previuosly   Screening Referrals N/A    Medical Referral N/A    Medical Appointment Made N/A    Food Have Food Insecurities    Transportation Need transportation assistance    Housing/Utilities No permanent housing    Interpersonal Safety N/A   client is living on the street, which is not a safe enviroment   Intervention Counsel;Support;Case Management    ED Visit Averted N/A    Life-Saving Intervention Made N/A

## 2022-08-05 NOTE — Congregational Nurse Program (Signed)
  Dept: [phone]   Congregational Nurse Program Note  Date of Encounter: 08/05/2022 Client to [facility] clinic to get the medications RN picked up for her from [place]. 7 days worth of her medications. RN discussed the need for her to go to [place] [facility] as well as work with this RN to complete an application for the [name]. She also voiced understanding about the importance of being followed by a practitioner as she is on Lithium and will require regular labs. Client voiced understanding.  Past Medical History: Past Medical History:  Diagnosis Date   Allergy    Seasonal, Ultram (seizures)   Anemia 2005   Bipolar 1 disorder ([place])    Glaucoma    Seizures ([place]) 2008    Encounter Details:  CNP Questionnaire - 08/05/22 1000       Questionnaire   Do you give verbal consent to treat you today? Yes    Location Patient [place] or Organization    Patient Status Homeless    Insurance Uninsured ([place] Card/Care Connects/Self-Pay)    Insurance Referral N/A    Medication Have Medication Insecurities    Medical Provider No   client has been to [employer]. RN attempting to assist client with [facility] application   Screening Referrals N/A    Medical Referral N/A    Medical Appointment Made N/A    Food Have Food Insecurities    Transportation N/A    Housing/Utilities No permanent housing    Interpersonal Safety N/A   client is living on the street, which is not a safe enviroment   Intervention Counsel;Support;Case Management    ED Visit Averted N/A    Life-Saving Intervention Made N/A

## 2022-08-16 ENCOUNTER — Other Ambulatory Visit: Payer: Self-pay

## 2022-08-17 NOTE — Congregational Nurse Program (Signed)
  Dept: [phone]   Congregational Nurse Program Note  Date of Encounter: 08/17/2022 Client to [facility] clinic today for nursing visit related to her medications. RN assisted with and completed an application for the [facility] clinic. RN explained that the [facility] clinic, if client attends her appointments regularly, can follow her and monitor her lithium and Depakote levels. Client voiced understanding and appreciation. RN at this time is picking up a 7 day supply at a time from the [facility]. These medications were prescribed at her discharge from [facility] health on 9/22. RN to drop off completed application today .  Past Medical History: Past Medical History:  Diagnosis Date   Allergy    Seasonal, Ultram (seizures)   Anemia 2005   Bipolar 1 disorder ([place])    Glaucoma    Seizures ([place]) 2008    Encounter Details:  CNP Questionnaire - 08/17/22 1010       Questionnaire   Ask client: Do you give verbal consent for me to treat you today? Yes    Student Assistance N/A    Location Patient [place]    Visit Setting with Client Organization    Patient Status Unhoused    Insurance Uninsured ([place] Card/Care Connects/Self-Pay/Medicaid Family Planning)    Insurance/Financial Assistance Referral N/A    Medication Have Medication Insecurities    Medical Provider No   [facility] application completed today 10/10   Screening Referrals Made N/A    Medical Referrals Made [facility]inic    Medical Appointment Made N/A    Recently w/o PCP, now 1st time PCP visit completed due to CNs referral or appointment made N/A    Food Have Food Insecurities   client reports she and is getting food from the [employer] on [address]    Housing/Utilities No permanent housing    Chiropractor N/A   client is living on the street, which is not a safe enviroment   Interventions Advocate/Support;Case  Management;Educate;Reviewed Medications    Abnormal to Normal Screening Since Last CN Visit N/A    Screenings CN Performed Blood Pressure    Sent Client to Lab for: N/A    Did client attend any of the following based off CNs referral or appointments made? N/A    ED Visit Averted N/A    Life-Saving Intervention Made N/A      Questionnaire   Do you give verbal consent to treat you today? Yes    Location Patient [place] or Organization    Patient Status Homeless    Insurance Uninsured ([place] Card/Care Connects/Self-Pay)    Insurance Referral N/A    Medication Have Medication Insecurities    Screening Referrals N/A    Intervention Counsel;Support;Case Management;Blood pressure

## 2022-08-18 ENCOUNTER — Other Ambulatory Visit: Payer: Self-pay

## 2022-08-24 NOTE — Congregational Nurse Program (Signed)
  Dept: [place] Nurse Program Note  Date of Encounter: 08/24/2022 Client to clinic to follow up on [facility] application and Prevent [facility] application. [facility] apt has been made for 10/25 at 12:30. RN has left message at [facility], awaiting return call to set apt. Client aware. Reminder card given for [facility] apt. Past Medical History: Past Medical History:  Diagnosis Date   Allergy    Seasonal, Ultram (seizures)   Anemia 2005   Bipolar 1 disorder ([place])    Glaucoma    Seizures ([place]) 2008    Encounter Details:  CNP Questionnaire - 08/24/22 0900       Questionnaire   Ask client: Do you give verbal consent for me to treat you today? Yes    Student Assistance N/A    Location Patient [place]    Visit Setting with Client Organization    Patient Status Unhoused    Insurance Uninsured ([place] Card/Care Connects/Self-Pay/Medicaid Family Planning)    Insurance/Financial Assistance Referral N/A    Medication Have Medication Insecurities    Medical Provider No   [facility] application completed today 10/10   Screening Referrals Made N/A    Medical Referrals Made [facility]    Medical Appointment Made [facility]   [facility] apt 10/25 at 82:50 pm. VSP certificate received, attempted to make an apt   Recently w/o PCP, now 1st time PCP visit completed due to CNs referral or appointment made [place] Insecurities   client reports she and is getting food from the [employer] on [name]    Housing/Utilities No permanent housing    Chiropractor N/A   client is living on the street, which is not a safe enviroment   Interventions Advocate/Support;Case Management;Educate    Abnormal to Normal Screening Since Last CN Visit N/A    Screenings CN Performed N/A    Sent Client to Lab for: N/A    Did client attend any of the following based off CNs referral or appointments made? N/A    ED  Visit Averted N/A    Life-Saving Intervention Made N/A      Questionnaire   Do you give verbal consent to treat you today? Yes    Location Patient [place] or Organization    Patient Status Homeless    Insurance Uninsured ([place] Card/Care Connects/Self-Pay)    Insurance Referral N/A    Medication Have Medication Insecurities   medications now from [place]   Screening Referrals N/A    Intervention Counsel;Support;Case Management;Educate

## 2022-08-25 ENCOUNTER — Other Ambulatory Visit: Payer: Self-pay

## 2022-09-01 ENCOUNTER — Encounter: Payer: Self-pay | Admitting: Gerontology

## 2022-09-01 ENCOUNTER — Other Ambulatory Visit: Payer: Self-pay

## 2022-09-01 ENCOUNTER — Ambulatory Visit: Payer: Medicaid Other | Admitting: Gerontology

## 2022-09-01 VITALS — BP 102/65 | HR 65 | Temp 97.8°F | Resp 16 | Ht 62.0 in | Wt 108.0 lb

## 2022-09-01 DIAGNOSIS — Z7689 Persons encountering health services in other specified circumstances: Secondary | ICD-10-CM | POA: Insufficient documentation

## 2022-09-01 DIAGNOSIS — F319 Bipolar disorder, unspecified: Secondary | ICD-10-CM

## 2022-09-01 DIAGNOSIS — R051 Acute cough: Secondary | ICD-10-CM

## 2022-09-01 DIAGNOSIS — R059 Cough, unspecified: Secondary | ICD-10-CM | POA: Insufficient documentation

## 2022-09-01 MED ORDER — ALBUTEROL SULFATE HFA 108 (90 BASE) MCG/ACT IN AERS
1.0000 | INHALATION_SPRAY | Freq: Four times a day (QID) | RESPIRATORY_TRACT | 3 refills | Status: DC | PRN
  Filled 2022-09-01: qty 6.7, 25d supply, fill #0

## 2022-09-01 NOTE — Progress Notes (Unsigned)
New Patient Office Visit  Subjective    Patient ID: Autumn [name], female    DOB: 10-17-1976  Age: 46 y.o. MRN: [id number]  CC:  Chief Complaint  Patient presents with   Establish Care   Cough    Patient c/o productive (yellow) cough, chest congestion and sinus congestion x 2 weeks. Patient has been using OTC cold medicine.   Amenorrhea    Patient states she is 1 week late on her period. She has been having unprotected sex with her boyfriend.    HPI Autumn [name] presents to establish care, she has a history of Bipolar 1 disorder and takes Lithium, Abilify, Depakoe, She states that her mood is good, denies suicidal nor homicidal ideation [place] .  She was discharged from [facility] on 9/22/23September and has not seen mental health provider. She c/o productive cough with yellowish phelgm that has been going on for 2 weeks, took otc cough medication, she denies fever, chills,. She also reports that she might be pregnant, late 1 week, unprotected sex.      Past Medical History:  Diagnosis Date   Allergy    Seasonal, Ultram (seizures)   Anemia 2005   Bipolar 1 disorder ([place])    Glaucoma    Seizures ([place]) 2008    Past Surgical History:  Procedure Laterality Date   BREAST EXCISIONAL BIOPSY Right    x 2   CESAREAN SECTION     x 2   LEEP  2000    Family History  Problem Relation Age of Onset   Hypertension Mother    Hyperlipidemia Mother    Asthma Mother    Parkinson's disease Father    Tourette syndrome Daughter    Lung cancer Maternal Grandmother    Alzheimer's disease Maternal Grandmother    Aneurysm Maternal Grandfather        brain   Other Paternal Grandfather 70       Cardiac arrest   Heart disease Paternal Grandfather     Social History   Socioeconomic History   Marital status: Legally Separated    Spouse name: Not on file   Number of children: Not on file   Years of education: Not on file   Highest education level: Not on  file  Occupational History   Not on file  Tobacco Use   Smoking status: Every Day    Packs/day: 0.75    Years: 15.00    Total pack years: 11.25    Types: Cigarettes   Smokeless tobacco: Former    Types: Chew   Tobacco comments:    Patient is homeless, living in a tent with her boyfriend  Vaping Use   Vaping Use: Former   Quit date: 06/01/2022   Substances: Nicotine  Substance and Sexual Activity   Alcohol use: Yes    Alcohol/week: 8.0 standard drinks of alcohol    Types: 8 Shots of liquor per week    Comment: currently 1 drink per week or less   Drug use: Not Currently    Frequency: 1.0 times per week    Types: Marijuana, Cocaine, Heroin, Methamphetamines, MDMA (Ecstacy)    Comment: last use 06/2022   Sexual activity: Yes    Partners: Male    Birth control/protection: None  Other Topics Concern   Not on file  Social History Narrative   Not on file   Social Determinants of Health   Financial Resource Strain: Not on file  Food Insecurity:  Food Insecurity Present (09/01/2022)   Hunger Vital Sign    Worried About Running Out of Food in the Last Year: Sometimes true    Ran Out of Food in the Last Year: Sometimes true  Transportation Needs: Unmet Transportation Needs (09/01/2022)   PRAPARE - Hydrologist (Medical): Yes    Lack of Transportation (Non-Medical): Yes  Physical Activity: Not on file  Stress: Not on file  Social Connections: Not on file  Intimate Partner Violence: Not At Risk (04/01/2022)   Humiliation, Afraid, Rape, and Kick questionnaire    Fear of Current or Ex-Partner: No    Emotionally Abused: No    Physically Abused: No    Sexually Abused: No    Review of Systems  Constitutional: Negative.   HENT:  Negative for sinus pain and sore throat.   Eyes: Negative.   Respiratory:  Positive for cough, sputum production (yellowish) and shortness of breath.   Cardiovascular: Negative.   Gastrointestinal: Negative.   Genitourinary:  Negative.   Musculoskeletal: Negative.   Skin: Negative.   Neurological: Negative.   Endo/Heme/Allergies: Negative.   Psychiatric/Behavioral: Negative.          Objective    BP 102/65 (BP Location: Right Arm, Patient Position: Sitting, Cuff Size: Normal)   Pulse 65   Temp 97.8 F (36.6 C) (Oral)   Resp 16   Ht '5\' 2"'$  (1.575 m)   Wt 108 lb (49 kg)   LMP 07/25/2022 (Exact Date)   SpO2 98%   BMI 19.75 kg/m   Physical Exam  {Labs (Optional):23779}    Assessment & Plan:   Problem List Items Addressed This Visit   None   No follow-ups on file.   Adine Heimann Jerold Coombe, NP

## 2022-09-02 ENCOUNTER — Other Ambulatory Visit: Payer: Self-pay

## 2022-09-02 LAB — CBC WITH DIFFERENTIAL/PLATELET
Basophils Absolute: 0 10*3/uL (ref 0.0–0.2)
Basos: 0 %
EOS (ABSOLUTE): 0.2 10*3/uL (ref 0.0–0.4)
Eos: 2 %
Hematocrit: 40.3 % (ref 34.0–46.6)
Hemoglobin: 13.5 g/dL (ref 11.1–15.9)
Immature Grans (Abs): 0 10*3/uL (ref 0.0–0.1)
Immature Granulocytes: 0 %
Lymphocytes Absolute: 3.1 10*3/uL (ref 0.7–3.1)
Lymphs: 27 %
MCH: 30.8 pg (ref 26.6–33.0)
MCHC: 33.5 g/dL (ref 31.5–35.7)
MCV: 92 fL (ref 79–97)
Monocytes Absolute: 0.4 10*3/uL (ref 0.1–0.9)
Monocytes: 4 %
Neutrophils Absolute: 8 10*3/uL — ABNORMAL HIGH (ref 1.4–7.0)
Neutrophils: 67 %
Platelets: 256 10*3/uL (ref 150–450)
RBC: 4.39 x10E6/uL (ref 3.77–5.28)
RDW: 12.4 % (ref 11.7–15.4)
WBC: 11.8 10*3/uL — ABNORMAL HIGH (ref 3.4–10.8)

## 2022-09-02 LAB — HEMOGLOBIN A1C
Est. average glucose Bld gHb Est-mCnc: 100 mg/dL
Hgb A1c MFr Bld: 5.1 % (ref 4.8–5.6)

## 2022-09-02 LAB — LIPID PANEL
Chol/HDL Ratio: 2.9 ratio (ref 0.0–4.4)
Cholesterol, Total: 163 mg/dL (ref 100–199)
HDL: 56 mg/dL (ref >39)
LDL Chol Calc (NIH): 81 mg/dL (ref 0–99)
Triglycerides: 149 mg/dL (ref 0–149)
VLDL Cholesterol Cal: 26 mg/dL (ref 5–40)

## 2022-09-02 LAB — PREGNANCY, URINE: Preg Test, Ur: NEGATIVE

## 2022-09-02 MED ORDER — BENZONATATE 100 MG PO CAPS
100.0000 mg | ORAL_CAPSULE | Freq: Three times a day (TID) | ORAL | 0 refills | Status: DC | PRN
  Filled 2022-09-02: qty 20, 7d supply, fill #0

## 2022-09-02 MED ORDER — GUAIFENESIN 100 MG/5ML PO LIQD
100.0000 mg | ORAL | 0 refills | Status: DC | PRN
  Filled 2022-09-02: qty 118, 4d supply, fill #0

## 2022-09-02 NOTE — Congregational Nurse Program (Signed)
  Dept: [phone]   Congregational Nurse Program Note  Date of Encounter: 09/02/2022 Client to [facility] clinic as follow up to her first [facility] apt. On 10/25. Client was concerned about the results to her pregnancy test. RN was able to view results, which were negative. Discussed birth control. She reports she [facility] a [facility], RN contacted [facility] at client request. They will fill the prescription through her family planning Medicaid. Refills and the the 3 new medications ordered at the [facility] apt are ready at the [facility]. Client plans to pick these up herself.  Past Medical History: Past Medical History:  Diagnosis Date   Allergy    Seasonal, Ultram (seizures)   Anemia 2005   Bipolar 1 disorder ([place])    Glaucoma    Seizures ([place]) 2008    Encounter Details:  CNP Questionnaire - 09/02/22 1231       Questionnaire   Ask client: Do you give verbal consent for me to treat you today? Yes    Student Assistance N/A    Location Patient [place]    Visit Setting with Client Organization    Patient Status Unhoused    Insurance Uninsured ([place] Card/Care Connects/Self-Pay/Medicaid Family Planning)    Insurance/Financial Assistance Referral N/A    Medication N/A   getting medications from [place] Provider Yes   [facility]   Screening Referrals Made N/A    Medical Referrals Made N/A    Medical Appointment Made N/A   [facility] apt 10/25 at 29:56 pm. VSP certificate received, attempted to make an apt   Recently w/o PCP, now 1st time PCP visit completed due to CNs referral or appointment made Yes   Client had first Open door apt on 10/25   [place] Insecurities   client reports she and is getting food from the [employer] on [place] bus system   Housing/Utilities No permanent housing    Chiropractor N/A   client  is living on the street, which is not a safe enviroment   Interventions Advocate/Support;Case Management;Educate    Abnormal to Normal Screening Since Last CN Visit N/A    Screenings CN Performed N/A    Sent Client to Lab for: N/A    Did client attend any of the following based off CNs referral or appointments made? N/A    ED Visit Averted N/A    Life-Saving Intervention Made N/A      Questionnaire   Do you give verbal consent to treat you today? Yes    Location Patient [place] or Organization    Patient Status Homeless    Insurance Uninsured ([place] Card/Care Connects/Self-Pay)    Insurance Referral N/A    Medication Have Medication Insecurities   medications now from [place]   Screening Referrals N/A    Intervention Support;Case Management;Educate

## 2022-09-06 ENCOUNTER — Other Ambulatory Visit: Payer: Self-pay

## 2022-09-16 NOTE — Congregational Nurse Program (Signed)
  Dept: [phone]   Congregational Nurse Program Note  Date of Encounter: 09/16/2022 Client to [facility] day center for assistance with making an appointment at the health dept for a pregnancy test. Apt made for 11/21 at 3:30, first available. Client given an apt card. No other needs at this time. Emotional support given. Past Medical History: Past Medical History:  Diagnosis Date   Allergy    Seasonal, Ultram (seizures)   Anemia 2005   Bipolar 1 disorder ([place])    Glaucoma    Seizures ([place]) 2008    Encounter Details:  CNP Questionnaire - 09/16/22 1000       Questionnaire   Ask client: Do you give verbal consent for me to treat you today? Yes    Student Assistance N/A    Location Patient [facility]    Visit Setting with Client Organization    Patient Status Unhoused    Insurance Uninsured ([place] Card/Care Connects/Self-Pay/Medicaid Family Planning)    Insurance/Financial Assistance Referral N/A    Medication N/A   getting medications from [place] Provider Yes   [facility]   Screening Referrals Made N/A    Medical Referrals [place] [facility]   [facility] apt 10/25 at 93:23 pm. VSP certificate received, attempted to make an apt   Recently w/o PCP, now 1st time PCP visit completed due to CNs referral or appointment made Yes   Client had first Open door apt on 10/25   [place] Insecurities   client reports she and is getting food from the [employer] on [place] bus system   Housing/Utilities No permanent housing    Chiropractor N/A   client is living on the street, which is not a safe enviroment   Interventions Advocate/Support;Case Management;Educate    Abnormal to Normal Screening Since Last CN Visit N/A    Screenings CN Performed N/A    Sent Client to Lab for: N/A    Did client attend any of the following based off CNs  referral or appointments made? N/A    ED Visit Averted N/A    Life-Saving Intervention Made N/A      Questionnaire   Do you give verbal consent to treat you today? Yes    Location Patient [place] or Organization    Patient Status Homeless    Insurance Uninsured ([place] Card/Care Connects/Self-Pay)    Insurance Referral N/A    Medication Have Medication Insecurities   medications now from [place]   Screening Referrals N/A    Intervention Support;Case Management;Educate

## 2022-09-20 ENCOUNTER — Other Ambulatory Visit: Payer: Self-pay

## 2022-10-21 ENCOUNTER — Emergency Department
Admission: EM | Admit: 2022-10-21 | Discharge: 2022-10-21 | Disposition: A | Discharge status: Home / Self Care | Payer: Medicaid Other | Attending: Emergency Medicine | Admitting: Emergency Medicine

## 2022-10-21 ENCOUNTER — Emergency Department: Payer: Medicaid Other

## 2022-10-21 ENCOUNTER — Other Ambulatory Visit: Payer: Self-pay

## 2022-10-21 DIAGNOSIS — Z720 Tobacco use: Secondary | ICD-10-CM | POA: Diagnosis not present

## 2022-10-21 DIAGNOSIS — N3090 Cystitis, unspecified without hematuria: Secondary | ICD-10-CM | POA: Diagnosis not present

## 2022-10-21 DIAGNOSIS — R1031 Right lower quadrant pain: Secondary | ICD-10-CM | POA: Diagnosis present

## 2022-10-21 LAB — COMPREHENSIVE METABOLIC PANEL
ALT: 10 U/L (ref 0–44)
AST: 9 U/L — ABNORMAL LOW (ref 15–41)
Albumin: 4.5 g/dL (ref 3.5–5.0)
Alkaline Phosphatase: 49 U/L (ref 38–126)
Anion gap: 3 — ABNORMAL LOW (ref 5–15)
BUN: 14 mg/dL (ref 6–20)
CO2: 26 mmol/L (ref 22–32)
Calcium: 8.9 mg/dL (ref 8.9–10.3)
Chloride: 107 mmol/L (ref 98–111)
Creatinine, Ser: 0.56 mg/dL (ref 0.44–1.00)
GFR, Estimated: >60 mL/min (ref >60)
Glucose, Bld: 93 mg/dL (ref 70–99)
Potassium: 3.7 mmol/L (ref 3.5–5.1)
Sodium: 136 mmol/L (ref 135–145)
Total Bilirubin: 0.9 mg/dL (ref 0.3–1.2)
Total Protein: 7.6 g/dL (ref 6.5–8.1)

## 2022-10-21 LAB — URINALYSIS, ROUTINE W REFLEX MICROSCOPIC
Bilirubin Urine: NEGATIVE
Glucose, UA: NEGATIVE mg/dL
Ketones, ur: NEGATIVE mg/dL
Nitrite: NEGATIVE
Protein, ur: 100 mg/dL — AB
RBC / HPF: >50 RBC/hpf — ABNORMAL HIGH (ref 0–5)
Specific Gravity, Urine: 1.021 (ref 1.005–1.030)
WBC, UA: >50 WBC/hpf — ABNORMAL HIGH (ref 0–5)
pH: 7 (ref 5.0–8.0)

## 2022-10-21 LAB — CBC
HCT: 43.7 % (ref 36.0–46.0)
Hemoglobin: 14.3 g/dL (ref 12.0–15.0)
MCH: 30.4 pg (ref 26.0–34.0)
MCHC: 32.7 g/dL (ref 30.0–36.0)
MCV: 92.8 fL (ref 80.0–100.0)
Platelets: 250 10*3/uL (ref 150–400)
RBC: 4.71 MIL/uL (ref 3.87–5.11)
RDW: 11.6 % (ref 11.5–15.5)
WBC: 12.5 10*3/uL — ABNORMAL HIGH (ref 4.0–10.5)
nRBC: 0 % (ref 0.0–0.2)

## 2022-10-21 LAB — POC URINE PREG, ED: Preg Test, Ur: NEGATIVE

## 2022-10-21 LAB — LIPASE, BLOOD: Lipase: 37 U/L (ref 11–51)

## 2022-10-21 MED ORDER — KETOROLAC TROMETHAMINE 15 MG/ML IJ SOLN
15.0000 mg | Freq: Once | INTRAMUSCULAR | Status: AC
  Administered 2022-10-21: 15 mg via INTRAVENOUS
  Filled 2022-10-21: qty 1

## 2022-10-21 MED ORDER — IOHEXOL 300 MG/ML  SOLN
75.0000 mL | Freq: Once | INTRAMUSCULAR | Status: AC | PRN
  Administered 2022-10-21: 75 mL via INTRAVENOUS

## 2022-10-21 MED ORDER — NITROFURANTOIN MONOHYD MACRO 100 MG PO CAPS
100.0000 mg | ORAL_CAPSULE | Freq: Two times a day (BID) | ORAL | 0 refills | Status: AC
  Filled 2022-10-21: qty 10, 5d supply, fill #0

## 2022-10-21 MED ORDER — NITROFURANTOIN MONOHYD MACRO 100 MG PO CAPS
100.0000 mg | ORAL_CAPSULE | Freq: Once | ORAL | Status: AC
  Administered 2022-10-21: 100 mg via ORAL
  Filled 2022-10-21: qty 1

## 2022-10-21 NOTE — ED Triage Notes (Signed)
Pt states coming in for abdominal pain for the last 3 weeks when eating, but consistent abdominal pain with pink urine for the last 3 days.
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
## 2022-11-15 ENCOUNTER — Other Ambulatory Visit: Payer: Self-pay

## 2022-11-25 NOTE — Congregational Nurse Program (Signed)
  Dept: [phone]   Congregational Nurse Program Note  Date of Encounter: 11/25/2022 Client to [facility] day Center today, she has not been coming to the center with any regularity. Client was agreeable to sitting with RN to make an appointment for an eye exam. Rn was able to make an apt at [facility] on [address] For 3 pm today. Client was agreeable to this time. VSP certificate given from [employer]. Client was offered a [place] bus pass for transportation, she declined and preferred to walk with her partner. No other needs at this time. Support given. Past Medical History: Past Medical History:  Diagnosis Date   Allergy    Seasonal, Ultram (seizures)   Anemia 2005   Bipolar 1 disorder ([place])    Glaucoma    Seizures ([place]) 2008    Encounter Details:  CNP Questionnaire - 11/25/22 1030       Questionnaire   Ask client: Do you give verbal consent for me to treat you today? Yes    Student Assistance N/A    Location Patient [place]    Visit Setting with Client Organization    Patient Status Unhoused   client is currenlty [place] with another person   Insurance Uninsured ([place] Card/Care Connects/Self-Pay/Medicaid Family Planning)   client report she has full Medicaid now, but does not have a card.   Insurance/Financial Assistance Referral N/A    Medication N/A   getting medications from [place] Provider Yes   [facility], if client has Medicaid, she will need a new PCP as she can no longer  be seen at the [facility]   Screening Referrals [place]   apt made at [facility] for today 1/17 at 3 pm. Cle[place] given her VSP certificate for [employer]   Recently w/o PCP, now 1st time PCP visit completed due to CNs referral or appointment made [place] [employer]   client reports she and is getting food from the  [employer] on wednesdays, unsure of food stamps at this time.   Transportation N/A   [employer] bus pass offered for vision apt today 1/17, client declined   Housing/Utilities No permanent housing    Interpersonal Safety N/A   client is living on the street, which is not a safe enviroment   Interventions Advocate/Support;Case Management    Abnormal to Normal Screening Since Last CN Visit N/A    Screenings CN Performed N/A    Sent Client to Lab for: N/A    Did client attend any of the following based off CNs referral or appointments made? N/A   vision   ED Visit Averted N/A    Life-Saving Intervention Made N/A

## 2023-01-10 ENCOUNTER — Emergency Department
Admission: EM | Admit: 2023-01-10 | Discharge: 2023-01-10 | Disposition: A | Discharge status: Home / Self Care | Payer: No Typology Code available for payment source | Attending: Emergency Medicine | Admitting: Emergency Medicine

## 2023-01-10 DIAGNOSIS — F1290 Cannabis use, unspecified, uncomplicated: Secondary | ICD-10-CM | POA: Diagnosis not present

## 2023-01-10 DIAGNOSIS — F1510 Other stimulant abuse, uncomplicated: Secondary | ICD-10-CM | POA: Diagnosis not present

## 2023-01-10 DIAGNOSIS — F319 Bipolar disorder, unspecified: Secondary | ICD-10-CM | POA: Diagnosis present

## 2023-01-10 DIAGNOSIS — F17200 Nicotine dependence, unspecified, uncomplicated: Secondary | ICD-10-CM | POA: Diagnosis present

## 2023-01-10 DIAGNOSIS — F3110 Bipolar disorder, current episode manic without psychotic features, unspecified: Secondary | ICD-10-CM | POA: Diagnosis present

## 2023-01-10 DIAGNOSIS — F17210 Nicotine dependence, cigarettes, uncomplicated: Secondary | ICD-10-CM | POA: Diagnosis not present

## 2023-01-10 DIAGNOSIS — R569 Unspecified convulsions: Secondary | ICD-10-CM

## 2023-01-10 DIAGNOSIS — F332 Major depressive disorder, recurrent severe without psychotic features: Secondary | ICD-10-CM | POA: Diagnosis present

## 2023-01-10 DIAGNOSIS — F32A Depression, unspecified: Secondary | ICD-10-CM

## 2023-01-10 DIAGNOSIS — F1010 Alcohol abuse, uncomplicated: Secondary | ICD-10-CM | POA: Diagnosis present

## 2023-01-10 DIAGNOSIS — Z1152 Encounter for screening for COVID-19: Secondary | ICD-10-CM | POA: Diagnosis not present

## 2023-01-10 LAB — URINE DRUG SCREEN, QUALITATIVE (ARMC ONLY)
Amphetamines, Ur Screen: NOT DETECTED
Barbiturates, Ur Screen: NOT DETECTED
Benzodiazepine, Ur Scrn: NOT DETECTED
Cannabinoid 50 Ng, Ur ~~LOC~~: POSITIVE — AB
Cocaine Metabolite,Ur ~~LOC~~: NOT DETECTED
MDMA (Ecstasy)Ur Screen: NOT DETECTED
Methadone Scn, Ur: NOT DETECTED
Opiate, Ur Screen: NOT DETECTED
Phencyclidine (PCP) Ur S: NOT DETECTED
Tricyclic, Ur Screen: NOT DETECTED

## 2023-01-10 LAB — COMPREHENSIVE METABOLIC PANEL
ALT: 11 U/L (ref 0–44)
AST: 11 U/L — ABNORMAL LOW (ref 15–41)
Albumin: 4.2 g/dL (ref 3.5–5.0)
Alkaline Phosphatase: 48 U/L (ref 38–126)
Anion gap: 7 (ref 5–15)
BUN: 10 mg/dL (ref 6–20)
CO2: 24 mmol/L (ref 22–32)
Calcium: 8.3 mg/dL — ABNORMAL LOW (ref 8.9–10.3)
Chloride: 107 mmol/L (ref 98–111)
Creatinine, Ser: 0.54 mg/dL (ref 0.44–1.00)
GFR, Estimated: >60 mL/min (ref >60)
Glucose, Bld: 111 mg/dL — ABNORMAL HIGH (ref 70–99)
Potassium: 3.3 mmol/L — ABNORMAL LOW (ref 3.5–5.1)
Sodium: 138 mmol/L (ref 135–145)
Total Bilirubin: 0.4 mg/dL (ref 0.3–1.2)
Total Protein: 6.4 g/dL — ABNORMAL LOW (ref 6.5–8.1)

## 2023-01-10 LAB — RESP PANEL BY RT-PCR (RSV, FLU A&B, COVID)  RVPGX2
Influenza A by PCR: NEGATIVE
Influenza B by PCR: NEGATIVE
Resp Syncytial Virus by PCR: NEGATIVE
SARS Coronavirus 2 by RT PCR: NEGATIVE

## 2023-01-10 LAB — CBC
HCT: 36.6 % (ref 36.0–46.0)
Hemoglobin: 11.9 g/dL — ABNORMAL LOW (ref 12.0–15.0)
MCH: 29.9 pg (ref 26.0–34.0)
MCHC: 32.5 g/dL (ref 30.0–36.0)
MCV: 92 fL (ref 80.0–100.0)
Platelets: 236 10*3/uL (ref 150–400)
RBC: 3.98 MIL/uL (ref 3.87–5.11)
RDW: 12.7 % (ref 11.5–15.5)
WBC: 8.3 10*3/uL (ref 4.0–10.5)
nRBC: 0 % (ref 0.0–0.2)

## 2023-01-10 LAB — POC URINE PREG, ED: Preg Test, Ur: NEGATIVE

## 2023-01-10 LAB — ACETAMINOPHEN LEVEL: Acetaminophen (Tylenol), Serum: <10 ug/mL — ABNORMAL LOW (ref 10–30)

## 2023-01-10 LAB — SALICYLATE LEVEL: Salicylate Lvl: <7 mg/dL — ABNORMAL LOW (ref 7.0–30.0)

## 2023-01-10 LAB — ETHANOL: Alcohol, Ethyl (B): <10 mg/dL (ref <10)

## 2023-01-10 MED ORDER — NICOTINE 21 MG/24HR TD PT24
21.0000 mg | MEDICATED_PATCH | Freq: Once | TRANSDERMAL | Status: DC
  Administered 2023-01-10: 21 mg via TRANSDERMAL
  Filled 2023-01-10: qty 1

## 2023-01-10 NOTE — ED Notes (Signed)
Pt asleep. Snack not offered at this time.

## 2023-01-10 NOTE — Discharge Instructions (Addendum)
You have been seen in the emergency department for a  psychiatric concern. You have been evaluated both medically as well as psychiatrically. Please follow-up with your outpatient resources provided. Return to the emergency department for any worsening symptoms, or any thoughts of hurting yourself or anyone else so that we may attempt to help you.

## 2023-01-10 NOTE — ED Notes (Addendum)
Pts "fiance" Autumn [name] was in pts room to visit - pt became upset and visitor was asked to leave. Pt stating, "We just argue. That's why I've been drinking so much. I can't take it. I was eating [turkey sandwich tray] but now I can't even eat any more."  Pt repeatedly telling this RN that she does not want Autumn [name] listed as a contact, does not want his number listed in her chart, and does not want him to be able to visit. Per LouAnne (patient relations), a man by the name of "Autumn [name]" came in to lobby in attempt to visit with pt and identified himself as the pts father - pt states, "He is not my father. He is a friend." Pt repeatedly stating that she does not want Autumn [name] listed as a contact, does not want his phone number listed in her chart, and does not want him to be able to visit. Pt had Autumn [name] listed in chart as mother - pt reports she is "not allowed to contact" mother and requested that this RN remove Autumn [name] and associated phone number from patient contacts.

## 2023-01-10 NOTE — ED Provider Notes (Signed)
[facility] Provider Note    Event Date/Time   First MD Initiated Contact with Patient 01/10/23 1749     (approximate)  History   Chief Complaint: Psychiatric Evaluation  HPI  Autumn [name] is a 47 y.o. female with a past medical history bipolar, presents emergency department hoping to get back on her psychiatric medications.  According to the patient for the past 3 months she has been off of her psychiatric medications, states they were causing her to gain weight and feeling somewhat during the daytime.  Patient states she has been drinking more than normal which she states is 1 or 2 times a week.  Does admit to alcohol this morning, 1 beer.  Denies any medical complaints.  Patient does have a small bruise above the left eye when asked about it she states I do not want to talk about it.  Denies SI or HI.  Denies drug use.  States she just wants to get back on her psychiatric medications.  Physical Exam   Triage Vital Signs: ED Triage Vitals  Enc Vitals Group     BP 01/10/23 1727 118/81     Pulse Rate 01/10/23 1727 89     Resp 01/10/23 1727 18     Temp 01/10/23 1727 98.3 F (36.8 C)     Temp Source 01/10/23 1727 Oral     SpO2 01/10/23 1727 100 %     Weight 01/10/23 1722 104 lb 15 oz (47.6 kg)     Height 01/10/23 1722 '5\' 1"'$  (1.549 m)     Head Circumference --      Peak Flow --      Pain Score 01/10/23 1722 0     Pain Loc --      Pain Edu? --      Excl. in [place]? --     Most recent vital signs: Vitals:   01/10/23 1727  BP: 118/81  Pulse: 89  Resp: 18  Temp: 98.3 F (36.8 C)  SpO2: 100%    General: Awake, no distress.  CV:  Good peripheral perfusion.  Regular rate and rhythm  Resp:  Normal effort.  Equal breath sounds bilaterally.  Abd:  No distention.  Soft, nontender.  No rebound or guarding. Other:  Small bruise above left eye.   ED Results / Procedures / Treatments   MEDICATIONS ORDERED IN ED: Medications  nicotine (NICODERM CQ -  dosed in mg/24 hours) patch 21 mg (has no administration in time range)     IMPRESSION / MDM / ASSESSMENT AND PLAN / ED COURSE  I reviewed the triage vital signs and the nursing notes.  Patient's presentation is most consistent with acute presentation with potential threat to life or bodily function.  Patient presents emergency department for being off of her psychiatric medications open to get back on them.  She has no medical complaints.  No SI or HI.  Does not meet IVC criteria.  We will check labs we will closely monitor we will have psychiatry and TTS evaluate.  Patient agreeable to plan of care.  Lab work reassuring clued negative pregnancy test, normal chemistry, negative ethanol, negative salicylate and acetaminophen, normal CBC, normal urine drug screen results cannabinoid, negative COVID/flu/RSV.  Patient has been seen and evaluated by psychiatry.  They believe the patient is safe for discharge home from a psychiatric standpoint.  They recommended that the patient follow-up with [facility] to reestablish her medications as the patient has been off her medications for  over 3 months.  Patient agreeable.  FINAL CLINICAL IMPRESSION(S) / ED DIAGNOSES   Psychiatric evaluation   Note:  This document was prepared using Dragon voice recognition software and may include unintentional dictation errors.   Harvest Dark, MD 01/10/23 2209

## 2023-01-10 NOTE — ED Triage Notes (Signed)
Pt presents to the ED via POV due to Psych Evaluation. Pt states she has been on the same psych medications and they are not helping. Pt states she is drinking more than normal. Pt denies SI/HI. Pt A&Ox4

## 2023-01-10 NOTE — ED Notes (Signed)
Pt sleeping. 

## 2023-01-10 NOTE — ED Notes (Addendum)
BELONGINGS: White socks Black shoes Purple coat Black watch Purple plaid shirt Gray Pants Black backpack Green drawstring bag Purple Bra Black underwear Purple headband

## 2023-01-10 NOTE — Consult Note (Signed)
[facility] Consult   Reason for Consult: Psychiatric Evaluation  Referring Physician: Dr. Kerman Passey Patient Identification: Autumn [name] MRN:  [id number] Principal Diagnosis: <principal problem not specified> Diagnosis:  Active Problems:   Tobacco use disorder   Methamphetamine abuse ([place])   Bipolar affective disorder, current episode manic ([place])   Affective psychosis, bipolar ([place])   Alcohol abuse   MDD (major depressive disorder), recurrent severe, without psychosis ([place])   Bipolar 1 disorder ([place])   Seizures ([place]) onset 2003-2008   Bipolar affective disorder ([place])   Total Time spent with patient: 1 hour  Subjective: "I want to get back on my medication, but I don't to be admitted."  Autumn [name] is a 47 y.o. female patient presented to [facility] ED via POV voluntarily. The patient shared that she is here to get back on her medications. The patient shared that she has been off her psychiatric medications for three months and is beginning to have cravings for methamphetamines. It was suggested to the patient that if she thinks she is going to relapse, then she might want to be admitted to get back on her medications. The patient refused to be admitted and shared that she would rather be seen on an outpatient basis. She shared that she wants to establish a relationship with an outpatient provider and instead not be admitted to the inpatient unit. The patient shared that she is safe and does not want to harm herself or others. The patient stated that her last psychiatric medication regimen was very sedating. Therefore, this makes it difficult for her to get out of bed and go about her daily life.  This provider saw the patient face-to-face, reviewed the chart, and consulted with Dr. Kerman Passey on 01/10/2023 due to the patient's care. This provider discussed with the EDP that the patient does not meet the criteria for admission to the psychiatric inpatient unit.  Upon  evaluation, the patient is alert and oriented x4, calm, cooperative, and mood-congruent with affect. The patient does not appear to be responding to internal or external stimuli. Neither is the patient presenting with any delusional thinking. The patient denies auditory or visual hallucinations. The patient denies any suicidal, homicidal, or self-harm ideations. The patient is not presenting with any psychotic or paranoid behaviors. During an encounter with the patient, she could answer questions appropriately.  HPI: Per Dr. Kerman Passey,   Past Psychiatric History:  Seasonal, Ultram (seizures) Bipolar 1 disorder ([place]) Seizures ([place])   Risk to Self:   Risk to Others:   Prior Inpatient Therapy:   Prior Outpatient Therapy:    Past Medical History:  Past Medical History:  Diagnosis Date   Allergy    Seasonal, Ultram (seizures)   Anemia 2005   Bipolar 1 disorder ([place])    Glaucoma    Seizures ([place]) 2008    Past Surgical History:  Procedure Laterality Date   BREAST EXCISIONAL BIOPSY Right    x 2   CESAREAN SECTION     x 2   LEEP  2000   Family History:  Family History  Problem Relation Age of Onset   Hypertension Mother    Hyperlipidemia Mother    Asthma Mother    Parkinson's disease Father    Tourette syndrome Daughter    Lung cancer Maternal Grandmother    Alzheimer's disease Maternal Grandmother    Aneurysm Maternal Grandfather        brain   Other Paternal Grandfather 73       Cardiac arrest  Heart disease Paternal Grandfather    Family Psychiatric  History:  Parkinson's disease Father Tourette syndrome Daughter Alzheimer's disease Maternal Grandmother  Social History:  Social History   Substance and Sexual Activity  Alcohol Use Yes   Alcohol/week: 8.0 standard drinks of alcohol   Types: 8 Shots of liquor per week   Comment: currently 1 drink per week or less     Social History   Substance and Sexual Activity  Drug Use Not Currently   Frequency: 1.0  times per week   Types: Marijuana, Cocaine, Heroin, Methamphetamines, MDMA (Ecstacy)   Comment: last use 06/2022    Social History   Socioeconomic History   Marital status: Legally Separated    Spouse name: Not on file   Number of children: Not on file   Years of education: Not on file   Highest education level: Not on file  Occupational History   Not on file  Tobacco Use   Smoking status: Every Day    Packs/day: 0.75    Years: 15.00    Total pack years: 11.25    Types: Cigarettes   Smokeless tobacco: Former    Types: Chew   Tobacco comments:    Patient is homeless, living in a tent with her boyfriend  Vaping Use   Vaping Use: Former   Quit date: 06/01/2022   Substances: Nicotine  Substance and Sexual Activity   Alcohol use: Yes    Alcohol/week: 8.0 standard drinks of alcohol    Types: 8 Shots of liquor per week    Comment: currently 1 drink per week or less   Drug use: Not Currently    Frequency: 1.0 times per week    Types: Marijuana, Cocaine, Heroin, Methamphetamines, MDMA (Ecstacy)    Comment: last use 06/2022   Sexual activity: Yes    Partners: Male    Birth control/protection: None  Other Topics Concern   Not on file  Social History Narrative   Not on file   Social Determinants of Health   Financial Resource Strain: Not on file  Food Insecurity: Food Insecurity Present (09/01/2022)   Hunger Vital Sign    Worried About Running Out of Food in the Last Year: Sometimes true    Ran Out of Food in the Last Year: Sometimes true  Transportation Needs: Unmet Transportation Needs (09/01/2022)   PRAPARE - Hydrologist (Medical): Yes    Lack of Transportation (Non-Medical): Yes  Physical Activity: Not on file  Stress: Not on file  Social Connections: Not on file   Additional Social History:    Allergies:   Allergies  Allergen Reactions   Haldol [Haloperidol Lactate]     Patient states that she gets stiff muscles when taking Haldol    Ultram [Tramadol] Other (See Comments)    Seizures   Ziprasidone Hcl     Other reaction(s): Other (See Comments) PER PT WHEN SHE TAKES THIS SHE CANT MOVE HER NECK    Labs:  Results for orders placed or performed during the hospital encounter of 01/10/23 (from the past 48 hour(s))  Comprehensive metabolic panel     Status: Abnormal   Collection Time: 01/10/23  5:32 PM  Result Value Ref Range   Sodium 138 135 - 145 mmol/L   Potassium 3.3 (L) 3.5 - 5.1 mmol/L   Chloride 107 98 - 111 mmol/L   CO2 24 22 - 32 mmol/L   Glucose, Bld 111 (H) 70 - 99 mg/dL  Comment: Glucose reference range applies only to samples taken after fasting for at least 8 hours.   BUN 10 6 - 20 mg/dL   Creatinine, Ser 0.54 0.44 - 1.00 mg/dL   Calcium 8.3 (L) 8.9 - 10.3 mg/dL   Total Protein 6.4 (L) 6.5 - 8.1 g/dL   Albumin 4.2 3.5 - 5.0 g/dL   AST 11 (L) 15 - 41 U/L   ALT 11 0 - 44 U/L   Alkaline Phosphatase 48 38 - 126 U/L   Total Bilirubin 0.4 0.3 - 1.2 mg/dL   GFR, Estimated >60 >60 mL/min    Comment: (NOTE) Calculated using the CKD-EPI Creatinine Equation (2021)    Anion gap 7 5 - 15    Comment: Performed at [facility], [address] [place], [place] [address]  Ethanol     Status: None   Collection Time: 01/10/23  5:32 PM  Result Value Ref Range   Alcohol, Ethyl (B) <10 <10 mg/dL    Comment: (NOTE) Lowest detectable limit for serum alcohol is 10 mg/dL.  For medical purposes only. Performed at [facility], [address] [place], [place] [id number]   Salicylate level     Status: Abnormal   Collection Time: 01/10/23  5:32 PM  Result Value Ref Range   Salicylate Lvl [id number] (L) 7.0 - 30.0 mg/dL    Comment: Performed at [facility], [address] [place], [place] [address]  Acetaminophen level     Status: Abnormal   Collection Time: 01/10/23  5:32 PM  Result Value Ref Range   Acetaminophen (Tylenol), Serum <10 (L) 10 - 30 ug/mL    Comment: (NOTE) Therapeutic  concentrations vary significantly. A range of 10-30 ug/mL  may be an effective concentration for many patients. However, some  are best treated at concentrations outside of this range. Acetaminophen concentrations >150 ug/mL at 4 hours after ingestion  and >50 ug/mL at 12 hours after ingestion are often associated with  toxic reactions.  Performed at [facility], [address] [place], [place] [address]   cbc     Status: Abnormal   Collection Time: 01/10/23  5:32 PM  Result Value Ref Range   WBC 8.3 4.0 - 10.5 K/uL   RBC 3.98 3.87 - 5.11 MIL/uL   Hemoglobin 11.9 (L) 12.0 - 15.0 g/dL   HCT 36.6 36.0 - 46.0 %   MCV 92.0 80.0 - 100.0 fL   MCH 29.9 26.0 - 34.0 pg   MCHC 32.5 30.0 - 36.0 g/dL   RDW 12.7 11.5 - 15.5 %   Platelets 236 150 - 400 K/uL   nRBC 0.0 0.0 - 0.2 %    Comment: Performed at [facility], [address] [place], [place] [address]  Urine Drug Screen, Qualitative     Status: Abnormal   Collection Time: 01/10/23  5:32 PM  Result Value Ref Range   Tricyclic, Ur Screen NONE DETECTED NONE DETECTED   Amphetamines, Ur Screen NONE DETECTED NONE DETECTED   MDMA (Ecstasy)Ur Screen NONE DETECTED NONE DETECTED   Cocaine Metabolite,Ur [place] NONE DETECTED NONE DETECTED   Opiate, Ur Screen NONE DETECTED NONE DETECTED   Phencyclidine (PCP) Ur S NONE DETECTED NONE DETECTED   Cannabinoid 50 Ng, Ur [place] POSITIVE (A) NONE DETECTED   Barbiturates, Ur Screen NONE DETECTED NONE DETECTED   Benzodiazepine, Ur Scrn NONE DETECTED NONE DETECTED   Methadone Scn, Ur NONE DETECTED NONE DETECTED    Comment: (NOTE) Tricyclics + metabolites, urine  Cutoff 1000 ng/mL Amphetamines + metabolites, urine  Cutoff 1000 ng/mL MDMA (Ecstasy), urine              Cutoff 500 ng/mL Cocaine Metabolite, urine          Cutoff 300 ng/mL Opiate + metabolites, urine        Cutoff 300 ng/mL Phencyclidine (PCP), urine         Cutoff 25 ng/mL Cannabinoid, urine                 Cutoff 50  ng/mL Barbiturates + metabolites, urine  Cutoff 200 ng/mL Benzodiazepine, urine              Cutoff 200 ng/mL Methadone, urine                   Cutoff 300 ng/mL  The urine drug screen provides only a preliminary, unconfirmed analytical test result and should not be used for non-medical purposes. Clinical consideration and professional judgment should be applied to any positive drug screen result due to possible interfering substances. A more specific alternate chemical method must be used in order to obtain a confirmed analytical result. Gas chromatography / mass spectrometry (GC/MS) is the preferred confirm atory method. Performed at [facility], [address] [place], [place] [address]   Resp panel by RT-PCR (RSV, Flu A&B, Covid) Urine, Clean Catch     Status: None   Collection Time: 01/10/23  5:32 PM   Specimen: Urine, Clean Catch; Nasal Swab  Result Value Ref Range   SARS Coronavirus 2 by RT PCR NEGATIVE NEGATIVE    Comment: (NOTE) SARS-CoV-2 target nucleic acids are NOT DETECTED.  The SARS-CoV-2 RNA is generally detectable in upper respiratory specimens during the acute phase of infection. The lowest concentration of SARS-CoV-2 viral copies this assay can detect is 138 copies/mL. A negative result does not preclude SARS-Cov-2 infection and should not be used as the sole basis for treatment or other patient management decisions. A negative result may occur with  improper specimen collection/handling, submission of specimen other than nasopharyngeal swab, presence of viral mutation(s) within the areas targeted by this assay, and inadequate number of viral copies(<138 copies/mL). A negative result must be combined with clinical observations, patient history, and epidemiological information. The expected result is Negative.  Fact Sheet for Patients:  EntrepreneurPulse.com.au  Fact Sheet for Healthcare Providers:   IncredibleEmployment.be  This test is no t yet approved or cleared by the [place] FDA and  has been authorized for detection and/or diagnosis of SARS-CoV-2 by FDA under an Emergency Use Authorization (EUA). This EUA will remain  in effect (meaning this test can be used) for the duration of the COVID-19 declaration under Section 564(b)(1) of the Act, 21 U.S.C.section 360bbb-3(b)(1), unless the authorization is terminated  or revoked sooner.       Influenza A by PCR NEGATIVE NEGATIVE   Influenza B by PCR NEGATIVE NEGATIVE    Comment: (NOTE) The Xpert Xpress SARS-CoV-2/FLU/RSV plus assay is intended as an aid in the diagnosis of influenza from Nasopharyngeal swab specimens and should not be used as a sole basis for treatment. Nasal washings and aspirates are unacceptable for Xpert Xpress SARS-CoV-2/FLU/RSV testing.  Fact Sheet for Patients: EntrepreneurPulse.com.au  Fact Sheet for Healthcare Providers: IncredibleEmployment.be  This test is not yet approved or cleared by the [place] FDA and has been authorized for detection and/or diagnosis of SARS-CoV-2 by FDA under an Emergency Use Authorization (EUA). This EUA will remain  in effect (meaning this test can be used) for the duration of the COVID-19 declaration under Section 564(b)(1) of the Act, 21 U.S.C. section 360bbb-3(b)(1), unless the authorization is terminated or revoked.     Resp Syncytial Virus by PCR NEGATIVE NEGATIVE    Comment: (NOTE) Fact Sheet for Patients: EntrepreneurPulse.com.au  Fact Sheet for Healthcare Providers: IncredibleEmployment.be  This test is not yet approved or cleared by the [place] FDA and has been authorized for detection and/or diagnosis of SARS-CoV-2 by FDA under an Emergency Use Authorization (EUA). This EUA will remain in effect (meaning this test can be used) for the duration of  the COVID-19 declaration under Section 564(b)(1) of the Act, 21 U.S.C. section 360bbb-3(b)(1), unless the authorization is terminated or revoked.  Performed at [facility], [address] [place], [place] [address]   POC urine preg, ED     Status: None   Collection Time: 01/10/23  5:43 PM  Result Value Ref Range   Preg Test, Ur Negative Negative    Current Facility-Administered Medications  Medication Dose Route Frequency Provider Last Rate Last Admin   nicotine (NICODERM CQ - dosed in mg/24 hours) patch 21 mg  21 mg Transdermal Once Harvest Dark, MD   21 mg at 01/10/23 1907   Current Outpatient Medications  Medication Sig Dispense Refill   albuterol (VENTOLIN HFA) 108 (90 Base) MCG/ACT inhaler Inhale 1-2 puffs into the lungs every 6 (six) hours as needed for wheezing or shortness of breath. 6.7 g 3   ARIPiprazole (ABILIFY) 15 MG tablet Take 1 tablet (15 mg total) by mouth at bedtime. 30 tablet 2   benzonatate (TESSALON PERLES) 100 MG capsule Take 1 capsule (100 mg total) by mouth 3 (three) times daily as needed. 20 capsule 0   divalproex (DEPAKOTE) 250 MG DR tablet Take 5 tablets (1,250 mg total) by mouth at bedtime. 150 tablet 2   guaiFENesin (ROBITUSSIN) 100 MG/5ML liquid Take 5 mLs (100 mg total) by mouth every 4 (four) hours as needed for cough or to loosen phlegm. 118 mL 0   hydrOXYzine (ATARAX) 25 MG tablet Take 1 tablet (25 mg total) by mouth 3 (three) times daily as needed for anxiety. 60 tablet 2   lithium carbonate (ESKALITH) 450 MG CR tablet Take 2 tablets (900 mg total) by mouth at bedtime. 60 tablet 2   nicotine polacrilex (NICORETTE) 2 MG gum Chew one piece of gum by mouth as directed on package as needed for smoking cessation. (Patient not taking: Reported on 09/01/2022) 50 tablet 2   QUEtiapine (SEROQUEL) 300 MG tablet Take 1 tablet (300 mg total) by mouth at bedtime. 30 tablet 2    Musculoskeletal: Strength & Muscle Tone: within normal limits Gait  & Station: normal Patient leans: N/A Psychiatric Specialty Exam:  Presentation  General Appearance:  Appropriate for Environment  Eye Contact: Fair  Speech: Slow  Speech Volume: Normal  Handedness: Right   Mood and Affect  Mood: Euthymic  Affect: Appropriate   Thought Process  Thought Processes: Coherent  Descriptions of Associations:Intact  Orientation:Full (Time, Place and Person)  Thought Content:Logical  History of Schizophrenia/Schizoaffective disorder:Yes  Duration of Psychotic Symptoms:Greater than six months  Hallucinations:Hallucinations: None  Ideas of Reference:None  Suicidal Thoughts:Suicidal Thoughts: No  Homicidal Thoughts:Homicidal Thoughts: No   Sensorium  Memory: Immediate Good; Recent Good; Remote Good  Judgment: Fair  Insight: None   Executive Functions  Concentration: Good  Attention Span: Good  Recall: Good  Fund of Knowledge: Good  Language: Fair   Psychomotor Activity  Psychomotor Activity: Psychomotor Activity: Increased   Assets  Assets: Communication Skills; Desire for Improvement; Financial Resources/Insurance; Social Support; Physical Health   Sleep  Sleep: Sleep: Poor Number of Hours of Sleep: 8   Physical Exam: Physical Exam Vitals and nursing note reviewed.  Constitutional:      Appearance: Normal appearance. She is normal weight.  HENT:     Head: Normocephalic and atraumatic.     Right Ear: External ear normal.     Left Ear: External ear normal.     Nose: Nose normal.  Cardiovascular:     Rate and Rhythm: Normal rate.     Pulses: Normal pulses.  Pulmonary:     Effort: Pulmonary effort is normal.  Musculoskeletal:        General: Normal range of motion.     Cervical back: Normal range of motion.  Neurological:     General: No focal deficit present.     Mental Status: She is alert and oriented to person, place, and time.  Psychiatric:        Attention and Perception:  Attention and perception normal.        Mood and Affect: Mood and affect normal.        Speech: Speech normal.        Behavior: Behavior normal. Behavior is cooperative.        Thought Content: Thought content normal.        Cognition and Memory: Cognition and memory normal.        Judgment: Judgment normal.   Review of Systems  Psychiatric/Behavioral:  The patient is nervous/anxious.   All other systems reviewed and are negative.  Blood pressure 104/78, pulse 88, temperature 98.1 F (36.7 C), temperature source Oral, resp. rate 14, height '5\' 1"'$  (1.549 m), weight 47.6 kg, SpO2 100 %. Body mass index is 19.83 kg/m.  Treatment Plan Summary: Plan   Patient does not meet criteria for psychiatric inpatient admission.  Disposition: No evidence of imminent risk to self or others at present.   Patient does not meet criteria for psychiatric inpatient admission. Supportive therapy provided about ongoing stressors. Refer to IOP. Discussed crisis plan, support from social network, calling 911, coming to the Emergency Department, and calling Suicide Hotline.  Caroline Sauger, NP 01/10/2023 10:53 PM

## 2023-01-17 ENCOUNTER — Other Ambulatory Visit: Payer: Self-pay

## 2023-01-17 ENCOUNTER — Emergency Department
Admission: EM | Admit: 2023-01-17 | Discharge: 2023-01-17 | Disposition: A | Discharge status: Home / Self Care | Payer: No Typology Code available for payment source | Attending: Emergency Medicine | Admitting: Emergency Medicine

## 2023-01-17 DIAGNOSIS — F331 Major depressive disorder, recurrent, moderate: Secondary | ICD-10-CM

## 2023-01-17 DIAGNOSIS — R45851 Suicidal ideations: Secondary | ICD-10-CM

## 2023-01-17 DIAGNOSIS — F332 Major depressive disorder, recurrent severe without psychotic features: Secondary | ICD-10-CM | POA: Diagnosis not present

## 2023-01-17 DIAGNOSIS — F319 Bipolar disorder, unspecified: Secondary | ICD-10-CM | POA: Diagnosis present

## 2023-01-17 LAB — COMPREHENSIVE METABOLIC PANEL
ALT: 13 U/L (ref 0–44)
AST: 7 U/L — ABNORMAL LOW (ref 15–41)
Albumin: 4.6 g/dL (ref 3.5–5.0)
Alkaline Phosphatase: 59 U/L (ref 38–126)
Anion gap: 11 (ref 5–15)
BUN: 14 mg/dL (ref 6–20)
CO2: 23 mmol/L (ref 22–32)
Calcium: 9.1 mg/dL (ref 8.9–10.3)
Chloride: 105 mmol/L (ref 98–111)
Creatinine, Ser: 0.55 mg/dL (ref 0.44–1.00)
GFR, Estimated: >60 mL/min (ref >60)
Glucose, Bld: 119 mg/dL — ABNORMAL HIGH (ref 70–99)
Potassium: 3.5 mmol/L (ref 3.5–5.1)
Sodium: 139 mmol/L (ref 135–145)
Total Bilirubin: 1.1 mg/dL (ref 0.3–1.2)
Total Protein: 7.6 g/dL (ref 6.5–8.1)

## 2023-01-17 LAB — ACETAMINOPHEN LEVEL: Acetaminophen (Tylenol), Serum: <10 ug/mL — ABNORMAL LOW (ref 10–30)

## 2023-01-17 LAB — URINE DRUG SCREEN, QUALITATIVE (ARMC ONLY)
Amphetamines, Ur Screen: NOT DETECTED
Barbiturates, Ur Screen: NOT DETECTED
Benzodiazepine, Ur Scrn: NOT DETECTED
Cannabinoid 50 Ng, Ur ~~LOC~~: NOT DETECTED
Cocaine Metabolite,Ur ~~LOC~~: NOT DETECTED
MDMA (Ecstasy)Ur Screen: NOT DETECTED
Methadone Scn, Ur: NOT DETECTED
Opiate, Ur Screen: NOT DETECTED
Phencyclidine (PCP) Ur S: NOT DETECTED
Tricyclic, Ur Screen: NOT DETECTED

## 2023-01-17 LAB — SALICYLATE LEVEL: Salicylate Lvl: <7 mg/dL — ABNORMAL LOW (ref 7.0–30.0)

## 2023-01-17 LAB — CBC
HCT: 44.9 % (ref 36.0–46.0)
Hemoglobin: 14.8 g/dL (ref 12.0–15.0)
MCH: 30.1 pg (ref 26.0–34.0)
MCHC: 33 g/dL (ref 30.0–36.0)
MCV: 91.4 fL (ref 80.0–100.0)
Platelets: 330 10*3/uL (ref 150–400)
RBC: 4.91 MIL/uL (ref 3.87–5.11)
RDW: 12.9 % (ref 11.5–15.5)
WBC: 11.2 10*3/uL — ABNORMAL HIGH (ref 4.0–10.5)
nRBC: 0 % (ref 0.0–0.2)

## 2023-01-17 LAB — ETHANOL: Alcohol, Ethyl (B): <10 mg/dL (ref <10)

## 2023-01-17 LAB — PREGNANCY, URINE: Preg Test, Ur: NEGATIVE

## 2023-01-17 LAB — VALPROIC ACID LEVEL: Valproic Acid Lvl: <10 ug/mL — ABNORMAL LOW (ref 50.0–100.0)

## 2023-01-17 LAB — LITHIUM LEVEL: Lithium Lvl: <0.06 mmol/L — ABNORMAL LOW (ref 0.60–1.20)

## 2023-01-17 NOTE — ED Notes (Signed)
Called lab to inquire about pending labs and urine pregnancy. States they will process them

## 2023-01-17 NOTE — ED Notes (Signed)
Hospital meal provided.  100% consumed, pt tolerated w/o complaints.  Waste discarded appropriately.   

## 2023-01-17 NOTE — ED Triage Notes (Signed)
Pt here with SI. Pt states she went to [facility] today to try and get committed and was told that it would takes weeks. Pt endorses thoughts of wanting to hurt herself. Pt cooperative in trige.

## 2023-01-17 NOTE — ED Notes (Signed)
Pt given belongings to dress back into her personal clothing

## 2023-01-17 NOTE — ED Notes (Signed)
EDP at bedside  
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
## 2023-01-17 NOTE — ED Notes (Signed)
VOL/  PENDING  CONSULT 
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
## 2023-01-17 NOTE — ED Notes (Addendum)
Pt belongings:  1 pair of gray sneakers 1 pair of blue jeans 1 purple plaid shirt 1 pair of white and black socks 1 purple bra 1 black watch 1 teal sweater 1 pair of blue underwear 1 purple headband 1 black sweater  1 box of cigarettes

## 2023-01-17 NOTE — ED Provider Notes (Signed)
[facility] Provider Note    Event Date/Time   First MD Initiated Contact with Patient 01/17/23 [phone]     (approximate)   History   Chief Complaint Suicidal   HPI  Arlen Bakare is a 47 y.o. female with past medical history of bipolar disorder, seizures, and polysubstance abuse who presents to the ED for suicidal ideation.  Patient reports that she has been having thoughts of harming herself for about the past week but denies any specific plan.  She states that she went to [facility] earlier today and was told "they would get back to me in about a week."  She feels that she cannot wait that long and would like to be evaluated by a psychiatrist.  She denies any recent drug use, also denies any medical complaints.     Physical Exam   Triage Vital Signs: ED Triage Vitals  Enc Vitals Group     BP 01/17/23 0906 120/85     Pulse Rate 01/17/23 0906 95     Resp 01/17/23 0906 16     Temp 01/17/23 0906 97.8 F (36.6 C)     Temp Source 01/17/23 0906 Oral     SpO2 01/17/23 0906 99 %     Weight 01/17/23 0909 104 lb 15 oz (47.6 kg)     Height 01/17/23 0909 '5\' 1"'$  (1.549 m)     Head Circumference --      Peak Flow --      Pain Score 01/17/23 0909 0     Pain Loc --      Pain Edu? --      Excl. in [place]? --     Most recent vital signs: Vitals:   01/17/23 0906  BP: 120/85  Pulse: 95  Resp: 16  Temp: 97.8 F (36.6 C)  SpO2: 99%    Constitutional: Alert and oriented. Eyes: Conjunctivae are normal. Head: Atraumatic. Nose: No congestion/rhinnorhea. Mouth/Throat: Mucous membranes are moist.  Cardiovascular: Normal rate, regular rhythm. Grossly normal heart sounds.  2+ radial pulses bilaterally. Respiratory: Normal respiratory effort.  No retractions. Lungs CTAB. Gastrointestinal: Soft and nontender. No distention. Musculoskeletal: No lower extremity tenderness nor edema.  Neurologic:  Normal speech and language. No gross focal neurologic deficits are  appreciated.    ED Results / Procedures / Treatments   Labs (all labs ordered are listed, but only abnormal results are displayed) Labs Reviewed  COMPREHENSIVE METABOLIC PANEL - Abnormal; Notable for the following components:      Result Value   Glucose, Bld 119 (*)    AST 7 (*)    All other components within normal limits  SALICYLATE LEVEL - Abnormal; Notable for the following components:   Salicylate Lvl Q000111Q (*)    All other components within normal limits  ACETAMINOPHEN LEVEL - Abnormal; Notable for the following components:   Acetaminophen (Tylenol), Serum <10 (*)    All other components within normal limits  CBC - Abnormal; Notable for the following components:   WBC 11.2 (*)    All other components within normal limits  VALPROIC ACID LEVEL - Abnormal; Notable for the following components:   Valproic Acid Lvl <10 (*)    All other components within normal limits  LITHIUM LEVEL - Abnormal; Notable for the following components:   Lithium Lvl <0.06 (*)    All other components within normal limits  ETHANOL  URINE DRUG SCREEN, QUALITATIVE ([facility] ONLY)  PREGNANCY, URINE    PROCEDURES:  Critical Care performed: No  Procedures   MEDICATIONS ORDERED IN ED: Medications - No data to display   IMPRESSION / MDM / [place] / ED COURSE  I reviewed the triage vital signs and the nursing notes.                              47 y.o. female with past medical history of bipolar disorder, seizures, and polysubstance abuse who presents to the ED for psychiatric evaluation due to 1 week of suicidal ideation without any specific plan.  Patient's presentation is most consistent with acute presentation with potential threat to life or bodily function.  Differential diagnosis includes, but is not limited to, depression, psychosis, anxiety, substance abuse.  Patient nontoxic-appearing and in no acute distress, vital signs are unremarkable and she denies any medical  complaints.  Screening labs are reassuring with no significant anemia, leukocytosis, electrolyte abnormality, or AKI.  Tylenol and salicylate levels are undetectable, patient has been noncompliant with her medications and Depakote as well as lithium levels are undetectable.  Patient was evaluated by psychiatry and cleared for discharged home as she does not represent an acute threat to herself or others.  On my reassessment, patient denies any ongoing suicidal ideation and is agreeable with plan to follow-up as an outpatient.  She was counseled to return to the ED for new or worsening symptoms.      FINAL CLINICAL IMPRESSION(S) / ED DIAGNOSES   Final diagnoses:  Suicidal ideation     Rx / DC Orders   ED Discharge Orders     None        Note:  This document was prepared using Dragon voice recognition software and may include unintentional dictation errors.   Blake Divine, MD 01/17/23 [phone]

## 2023-01-20 ENCOUNTER — Ambulatory Visit: Payer: Medicaid Other

## 2023-03-03 ENCOUNTER — Inpatient Hospital Stay
Admission: EM | Admit: 2023-03-03 | Discharge: 2023-03-12 | DRG: 641 | Disposition: A | Discharge status: Other Acute Inpatient Hospital | Payer: Medicaid Other | Attending: Internal Medicine | Admitting: Internal Medicine

## 2023-03-03 ENCOUNTER — Other Ambulatory Visit: Payer: Self-pay

## 2023-03-03 ENCOUNTER — Encounter: Payer: Self-pay | Admitting: Intensive Care

## 2023-03-03 DIAGNOSIS — F17210 Nicotine dependence, cigarettes, uncomplicated: Secondary | ICD-10-CM | POA: Diagnosis present

## 2023-03-03 DIAGNOSIS — T426X1A Poisoning by other antiepileptic and sedative-hypnotic drugs, accidental (unintentional), initial encounter: Secondary | ICD-10-CM | POA: Insufficient documentation

## 2023-03-03 DIAGNOSIS — F332 Major depressive disorder, recurrent severe without psychotic features: Secondary | ICD-10-CM | POA: Diagnosis present

## 2023-03-03 DIAGNOSIS — F1010 Alcohol abuse, uncomplicated: Secondary | ICD-10-CM | POA: Diagnosis present

## 2023-03-03 DIAGNOSIS — G40909 Epilepsy, unspecified, not intractable, without status epilepticus: Secondary | ICD-10-CM | POA: Diagnosis present

## 2023-03-03 DIAGNOSIS — Z83438 Family history of other disorder of lipoprotein metabolism and other lipidemia: Secondary | ICD-10-CM

## 2023-03-03 DIAGNOSIS — Z888 Allergy status to other drugs, medicaments and biological substances status: Secondary | ICD-10-CM

## 2023-03-03 DIAGNOSIS — K589 Irritable bowel syndrome without diarrhea: Secondary | ICD-10-CM | POA: Diagnosis present

## 2023-03-03 DIAGNOSIS — Z820 Family history of epilepsy and other diseases of the nervous system: Secondary | ICD-10-CM

## 2023-03-03 DIAGNOSIS — Z79899 Other long term (current) drug therapy: Secondary | ICD-10-CM

## 2023-03-03 DIAGNOSIS — F1910 Other psychoactive substance abuse, uncomplicated: Secondary | ICD-10-CM | POA: Insufficient documentation

## 2023-03-03 DIAGNOSIS — Z825 Family history of asthma and other chronic lower respiratory diseases: Secondary | ICD-10-CM

## 2023-03-03 DIAGNOSIS — R7989 Other specified abnormal findings of blood chemistry: Secondary | ICD-10-CM | POA: Insufficient documentation

## 2023-03-03 DIAGNOSIS — Z5902 Unsheltered homelessness: Secondary | ICD-10-CM

## 2023-03-03 DIAGNOSIS — Z8659 Personal history of other mental and behavioral disorders: Secondary | ICD-10-CM

## 2023-03-03 DIAGNOSIS — F3132 Bipolar disorder, current episode depressed, moderate: Secondary | ICD-10-CM | POA: Diagnosis present

## 2023-03-03 DIAGNOSIS — F312 Bipolar disorder, current episode manic severe with psychotic features: Secondary | ICD-10-CM

## 2023-03-03 DIAGNOSIS — E876 Hypokalemia: Principal | ICD-10-CM | POA: Diagnosis present

## 2023-03-03 DIAGNOSIS — Z1152 Encounter for screening for COVID-19: Secondary | ICD-10-CM

## 2023-03-03 DIAGNOSIS — Z8249 Family history of ischemic heart disease and other diseases of the circulatory system: Secondary | ICD-10-CM

## 2023-03-03 DIAGNOSIS — R159 Full incontinence of feces: Secondary | ICD-10-CM | POA: Diagnosis present

## 2023-03-03 DIAGNOSIS — F319 Bipolar disorder, unspecified: Secondary | ICD-10-CM | POA: Diagnosis present

## 2023-03-03 DIAGNOSIS — Z8241 Family history of sudden cardiac death: Secondary | ICD-10-CM

## 2023-03-03 DIAGNOSIS — R32 Unspecified urinary incontinence: Secondary | ICD-10-CM | POA: Diagnosis present

## 2023-03-03 DIAGNOSIS — F1190 Opioid use, unspecified, uncomplicated: Secondary | ICD-10-CM | POA: Diagnosis present

## 2023-03-03 DIAGNOSIS — F1410 Cocaine abuse, uncomplicated: Secondary | ICD-10-CM | POA: Diagnosis present

## 2023-03-03 DIAGNOSIS — Z635 Disruption of family by separation and divorce: Secondary | ICD-10-CM

## 2023-03-03 DIAGNOSIS — F1510 Other stimulant abuse, uncomplicated: Secondary | ICD-10-CM | POA: Diagnosis present

## 2023-03-03 DIAGNOSIS — T426X5A Adverse effect of other antiepileptic and sedative-hypnotic drugs, initial encounter: Secondary | ICD-10-CM | POA: Diagnosis present

## 2023-03-03 LAB — COMPREHENSIVE METABOLIC PANEL
ALT: 15 U/L (ref 0–44)
AST: 9 U/L — ABNORMAL LOW (ref 15–41)
Albumin: 4.6 g/dL (ref 3.5–5.0)
Alkaline Phosphatase: 44 U/L (ref 38–126)
Anion gap: 9 (ref 5–15)
BUN: 16 mg/dL (ref 6–20)
CO2: 26 mmol/L (ref 22–32)
Calcium: 9.3 mg/dL (ref 8.9–10.3)
Chloride: 103 mmol/L (ref 98–111)
Creatinine, Ser: 0.59 mg/dL (ref 0.44–1.00)
GFR, Estimated: >60 mL/min (ref >60)
Glucose, Bld: 91 mg/dL (ref 70–99)
Potassium: 3.3 mmol/L — ABNORMAL LOW (ref 3.5–5.1)
Sodium: 138 mmol/L (ref 135–145)
Total Bilirubin: 1.1 mg/dL (ref 0.3–1.2)
Total Protein: 7.1 g/dL (ref 6.5–8.1)

## 2023-03-03 LAB — CBC
HCT: 40.7 % (ref 36.0–46.0)
Hemoglobin: 13.8 g/dL (ref 12.0–15.0)
MCH: 30.7 pg (ref 26.0–34.0)
MCHC: 33.9 g/dL (ref 30.0–36.0)
MCV: 90.4 fL (ref 80.0–100.0)
Platelets: 222 10*3/uL (ref 150–400)
RBC: 4.5 MIL/uL (ref 3.87–5.11)
RDW: 11.4 % — ABNORMAL LOW (ref 11.5–15.5)
WBC: 5.3 10*3/uL (ref 4.0–10.5)
nRBC: 0 % (ref 0.0–0.2)

## 2023-03-03 LAB — SALICYLATE LEVEL: Salicylate Lvl: <7 mg/dL — ABNORMAL LOW (ref 7.0–30.0)

## 2023-03-03 LAB — ETHANOL: Alcohol, Ethyl (B): <10 mg/dL (ref <10)

## 2023-03-03 LAB — ACETAMINOPHEN LEVEL: Acetaminophen (Tylenol), Serum: <10 ug/mL — ABNORMAL LOW (ref 10–30)

## 2023-03-03 MED ORDER — LORAZEPAM 1 MG PO TABS: 1.0000 mg | ORAL_TABLET | Freq: Four times a day (QID) | ORAL | Status: DC | PRN

## 2023-03-03 MED ORDER — MIDAZOLAM HCL 2 MG/2ML IJ SOLN
2.0000 mg | Freq: Once | INTRAMUSCULAR | Status: AC
  Administered 2023-03-03: 2 mg via INTRAMUSCULAR
  Filled 2023-03-03: qty 2

## 2023-03-03 MED ORDER — QUETIAPINE FUMARATE 100 MG PO TABS
100.0000 mg | ORAL_TABLET | Freq: Every day | ORAL | Status: DC
  Administered 2023-03-04 – 2023-03-11 (×8): 100 mg via ORAL
  Filled 2023-03-03: qty 1
  Filled 2023-03-03 (×4): qty 4
  Filled 2023-03-03: qty 1
  Filled 2023-03-03: qty 4
  Filled 2023-03-03: qty 1
  Filled 2023-03-03: qty 4
  Filled 2023-03-03 (×2): qty 1

## 2023-03-03 MED ORDER — ARIPIPRAZOLE 10 MG PO TABS
10.0000 mg | ORAL_TABLET | Freq: Once | ORAL | Status: DC
  Filled 2023-03-03: qty 1

## 2023-03-03 MED ORDER — LORAZEPAM 1 MG PO TABS: 1.0000 mg | ORAL_TABLET | ORAL | Status: DC | PRN

## 2023-03-03 MED ORDER — DIPHENHYDRAMINE HCL 25 MG PO CAPS: 50.0000 mg | ORAL_CAPSULE | Freq: Four times a day (QID) | ORAL | Status: DC | PRN

## 2023-03-03 MED ORDER — DIPHENHYDRAMINE HCL 50 MG/ML IJ SOLN: 50.0000 mg | Freq: Four times a day (QID) | INTRAMUSCULAR | Status: DC | PRN

## 2023-03-03 MED ORDER — DIPHENHYDRAMINE HCL 50 MG/ML IJ SOLN
50.0000 mg | Freq: Once | INTRAMUSCULAR | Status: AC
  Administered 2023-03-03: 50 mg via INTRAMUSCULAR
  Filled 2023-03-03: qty 1

## 2023-03-03 MED ORDER — ARIPIPRAZOLE 15 MG PO TABS
15.0000 mg | ORAL_TABLET | Freq: Every day | ORAL | Status: DC
  Administered 2023-03-06 – 2023-03-12 (×7): 15 mg via ORAL
  Filled 2023-03-03 (×8): qty 1

## 2023-03-03 MED ORDER — OLANZAPINE 10 MG PO TBDP: 10.0000 mg | ORAL_TABLET | Freq: Three times a day (TID) | ORAL | Status: DC | PRN

## 2023-03-03 MED ORDER — DIPHENHYDRAMINE HCL 50 MG/ML IJ SOLN
25.0000 mg | Freq: Once | INTRAMUSCULAR | Status: AC
  Administered 2023-03-03: 25 mg via INTRAMUSCULAR
  Filled 2023-03-03: qty 1

## 2023-03-03 MED ORDER — DIVALPROEX SODIUM 500 MG PO DR TAB
1250.0000 mg | DELAYED_RELEASE_TABLET | Freq: Every day | ORAL | Status: DC
  Administered 2023-03-04 – 2023-03-10 (×7): 1250 mg via ORAL
  Filled 2023-03-03 (×7): qty 1

## 2023-03-03 MED ORDER — LORAZEPAM 2 MG/ML IJ SOLN: 1.0000 mg | Freq: Four times a day (QID) | INTRAMUSCULAR | Status: DC | PRN

## 2023-03-03 MED ORDER — LITHIUM CARBONATE ER 450 MG PO TBCR
450.0000 mg | EXTENDED_RELEASE_TABLET | Freq: Two times a day (BID) | ORAL | Status: DC
  Administered 2023-03-04 – 2023-03-11 (×13): 450 mg via ORAL
  Filled 2023-03-03 (×15): qty 1
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
## 2023-03-03 NOTE — ED Notes (Signed)
NP recommends Inpatient Admit

## 2023-03-03 NOTE — ED Notes (Signed)
Will give abilify once pt is agreeable to take it and once received from pharm.
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
## 2023-03-03 NOTE — ED Notes (Signed)
Attempted to obtain vitals on patient again, patient asked for food. When food taken in to give to patient, patient stated food was "poisoned" and refused to eat it, also stating that she only would eat food from the "orange box". Patient was compliant with getting temperature, but then began getting combative and ripping everything off prior to getting results of BP and pulse/O2. Patient then ripped all of her clothing off. Staff left room, patient went to bed and covered up with sheet.

## 2023-03-03 NOTE — ED Notes (Signed)
IVC prior to arrival/  Consult ordered/Moved to BHU-2 for safety
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
## 2023-03-03 NOTE — ED Notes (Signed)
Lorenza Cambridge with psych team at bedside.

## 2023-03-03 NOTE — ED Notes (Addendum)
Unable to obtain vitals. Patient becomes combative when blood pressure cuff inflates and refuses to allow staff to hold her hand, threw off pulse ox in floor. Patient states "I've done enough." Patient also attempted to pull pants down again to show staff her genitalia.

## 2023-03-03 NOTE — ED Notes (Signed)
Patient knocking on door requesting food. Writer warmed up patient tray and handed it to patient. Patient states she is not hungry and threw food on floor. Patient making delusional statements towards security. Speech is pressured. Patient is hypersexual refusing to wear gown and walking around without clothing.
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
## 2023-03-03 NOTE — ED Notes (Signed)
This tech, RN Jazmine and security went into pt room to obtain vital signs on pt. This tech put the blood pressure cuff on pt left arm and pulse ox on pt hand and placed thermometer in pt mouth. Pt bit down on thermometer but this tech was able to obtain temperature. While pt was sitting there talking, pt snatched off cuff and pulse ox and claim that we already got vitals. Pt refused to give rest of vitals.

## 2023-03-03 NOTE — ED Notes (Signed)
Per triage RN, pt currently refusing to dress out and to allow for bloodwork collection.

## 2023-03-03 NOTE — ED Provider Notes (Signed)
[facility] Provider Note    Event Date/Time   First MD Initiated Contact with Patient 03/03/23 1324     (approximate)   History    EM caveat: Patient poor historian  HPI  Autumn [name] is a 46 y.o. female medical history of polysubstance abuse bipolar disorder, methamphetamine abuse seizure disorder  Patient reports she just recently left jail.  [facility] department advises that is correct.  She is now under IVC  Patient reports that she likes to use "black tar" and also "pixie sticks" and crystal meth  She reports that the sheriff picked her up, and brought her to the hospital.  Her desire is to live at the [employer] .  Of note, the patient also looks at me and thinks that I am her boyfriend from [place] who is coming to pick her up.  She also identifies me as possibly the Best boy or some other similar political figure.   Evidently patient has been incarcerated since March, sent to ER under IVC for further evaluation due to agitated elevated behavior.      Physical Exam   Triage Vital Signs: ED Triage Vitals  Enc Vitals Group     BP 03/03/23 1304 (!) 115/56     Pulse Rate 03/03/23 1304 (!) 107     Resp 03/03/23 1304 18     Temp 03/03/23 1304 98.4 F (36.9 C)     Temp Source 03/03/23 1304 Oral     SpO2 03/03/23 1304 98 %     Weight 03/03/23 1259 100 lb (45.4 kg)     Height 03/03/23 1259  (1.549 m)     Head Circumference --      Peak Flow --      Pain Score 03/03/23 1259 0     Pain Loc --      Pain Edu? --      Excl. in GC? --     Most recent vital signs: Vitals:   03/03/23 1304  BP: (!) 115/56  Pulse: (!) 107  Resp: 18  Temp: 98.4 F (36.9 C)  SpO2: 98%     General: Awake, no distress.  Patient seems slightly abrasive, but also demonstrates some hallucination thinking that I am her boyfriend or that it might represent a political figure.  She seems slightly confused reports she thinks she was in [place]  when she was arrested, but that does not quite make sense given that she was arrested in [facility]. CV:  Good peripheral perfusion.  Resp:  Normal effort.  Clear bilaterally Abd:  No distention.  Other:  Ambulates with steady gait.  is somewhat anxious  Normocephalic atraumatic.  No clinical history to suggest trauma or injury.  ED Results / Procedures / Treatments   Labs (all labs ordered are listed, but only abnormal results are displayed) Labs Reviewed  COMPREHENSIVE METABOLIC PANEL - Abnormal; Notable for the following components:      Result Value   Potassium 3.3 (*)    AST 9 (*)    All other components within normal limits  SALICYLATE LEVEL - Abnormal; Notable for the following components:   Salicylate Lvl <7.0 (*)    All other components within normal limits  ACETAMINOPHEN LEVEL - Abnormal; Notable for the following components:   Acetaminophen (Tylenol), Serum <10 (*)    All other components within normal limits  CBC - Abnormal; Notable for the following components:   RDW 11.4 (*)    All other  components within normal limits  ETHANOL  URINE DRUG SCREEN, QUALITATIVE ([facility] ONLY)  PREGNANCY, URINE  POC URINE PREG, ED     PROCEDURES:  Critical Care performed: No  Procedures   MEDICATIONS ORDERED IN ED: Medications  diphenhydrAMINE (BENADRYL) capsule 50 mg (has no administration in time range)    Or  diphenhydrAMINE (BENADRYL) injection 50 mg (has no administration in time range)  LORazepam (ATIVAN) tablet 1 mg (has no administration in time range)    Or  LORazepam (ATIVAN) injection 1 mg (has no administration in time range)  lithium carbonate (ESKALITH) ER tablet 450 mg (has no administration in time range)  divalproex (DEPAKOTE) DR tablet 1,250 mg (has no administration in time range)  ARIPiprazole (ABILIFY) tablet 15 mg (has no administration in time range)  QUEtiapine (SEROQUEL) tablet 100 mg (has no administration in time range)  OLANZapine zydis  (ZYPREXA) disintegrating tablet 10 mg (has no administration in time range)    And  LORazepam (ATIVAN) tablet 1 mg (has no administration in time range)  midazolam (VERSED) injection 2 mg (has no administration in time range)  diphenhydrAMINE (BENADRYL) injection 50 mg (has no administration in time range)  midazolam (VERSED) injection 2 mg (2 mg Intramuscular Given 03/03/23 1329)  midazolam (VERSED) injection 2 mg (2 mg Intramuscular Given 03/03/23 1431)  diphenhydrAMINE (BENADRYL) injection 25 mg (25 mg Intramuscular Given 03/03/23 1432)     IMPRESSION / MDM / ASSESSMENT AND PLAN / ED COURSE  I reviewed the triage vital signs and the nursing notes.                              Differential diagnosis includes, but is not limited to, probable exacerbation of underlying psychiatric illness, bipolar disorder, possible polysubstance abuse or mixed picture of both suspected.  She has a stable hemodynamics slightly tachycardic likely because she is somewhat anxious.  She denies any recent medical illness.  She does seem to be hallucinating, or having delusions that others around her are not who they are.  I suspect given the fact that she reports use of multiple substances this may be substance related as well.  She does carry a psychiatric diagnosis as well though  She will be left under IVC.  She will be seen evaluate by psychiatry and TTS whom I have consulted  Patient's presentation is most consistent with acute complicated illness / injury requiring diagnostic workup.   Clinical Course as of 03/03/23 1542  Thu Mar 03, 2023  1426 Patient under IVC.  She has attempted to spit on nursing staff, refusing to allow further evaluation.  Behaviors agitated at times.  Initial dose of midazolam helped briefly, the patient behavior has escalated again.  Noted allergies to Haldol and Zyprexa.  Will give additional dose of Versed now along with IM Benadryl to facilitate safe care and agitation [MQ]     Clinical Course User Index [MQ] Sharyn Creamer, MD   ----------------------------------------- 2:50 PM on 03/03/2023 ----------------------------------------- Patient calm, resting comfortably.  Compliant with care request.  Labs have now been sent.  Ongoing care assigned to Dr. Augustin Schooling at 3:30 PM.  Follow-up on pending psychiatric screening.  Psychiatry team has seen and evaluated, recommend the patient remain under IVC and are currently recommending psychiatric admission.  Again, medical screening labs are pending  FINAL CLINICAL IMPRESSION(S) / ED DIAGNOSES   Final diagnoses:  Bipolar affective disorder, currently manic, severe, with psychotic features  Rx / DC Orders   ED Discharge Orders     None        Note:  This document was prepared using Dragon voice recognition software and may include unintentional dictation errors.   Sharyn Creamer, MD 03/03/23 [phone]

## 2023-03-03 NOTE — ED Notes (Signed)
Pt is sleeping. Will try top obtain vitals on pt when pt wake up.

## 2023-03-03 NOTE — ED Notes (Signed)
Pt to be moved into a room as soon as one is available.

## 2023-03-03 NOTE — ED Notes (Signed)
Pt paranoid stating she doesn't want a food tray "with bed bugs on it". Explained to pt this is a fresh tray. Told pt this RN can order her a warm meal if she will allow this RN to take her blood. Pt still refusing bloodwork.

## 2023-03-03 NOTE — ED Notes (Signed)
Pt making comments about smoking but then when mentioning a nicotine patch pt stated "I'll chew it up".

## 2023-03-03 NOTE — ED Notes (Signed)
Pt given water and food tray. Will give a few more minutes for pt to hopefully calm down before trying to obtain blood. Pt saying random stuff with pressured speech; pt intermittently cussing at random and at staff directly.

## 2023-03-03 NOTE — ED Provider Notes (Addendum)
Emergency Medicine Observation Re-evaluation Note  Autumn [name] is a 47 y.o. female, seen on rounds today.  Pt initially presented to the ED for complaints of No chief complaint on file.  Currently, the patient is is no acute distress. Denies any concerns at this time.  Physical Exam  Blood pressure (!) 115/56, pulse (!) 107, temperature 98.4 F (36.9 C), temperature source Oral, resp. rate 18, height  (1.549 m), weight 45.4 kg, SpO2 98 %.  Physical Exam: General: No apparent distress Pulm: Normal WOB Neuro: Moving all extremities Psych: Yelling in the hallway, attempting to take off her close     ED Course / MDM   Clinical Course as of 03/03/23 1551  Thu Mar 03, 2023  1426 Patient under IVC.  She has attempted to spit on nursing staff, refusing to allow further evaluation.  Behaviors agitated at times.  Initial dose of midazolam helped briefly, the patient behavior has escalated again.  Noted allergies to Haldol and Zyprexa.  Will give additional dose of Versed now along with IM Benadryl to facilitate safe care and agitation [MQ]  1551 Called back to [facility] because patient had a fall when she went to stand because the pants were at her ankles.  No obvious head injury or loss of consciousness.  Able to ambulate in the emergency department without any difficulties.  Moving all extremities.  No external signs of trauma.  Patient just continues to yell expletives. [SM]    Clinical Course User Index [MQ] Sharyn Creamer, MD [SM] Corena Herter, MD      I have reviewed the labs performed to date as well as medications administered while in observation.   Plan   Current plan: Patient awaiting psychiatric disposition -psychiatry recommending inpatient placement.  Currently waiting for screening labs.  Patient agitated and attempting to take off her close in the hallway.  Multiple allergies listed in the chart.  Will give the patient IM benzodiazepine and Benadryl after unable to verbally  de-escalate.  Patient is under full IVC at this time.    Corena Herter, MD 03/03/23 1541    Corena Herter, MD 03/03/23 1551

## 2023-03-03 NOTE — ED Notes (Addendum)
Pt dressed out by this RN with officer in room as witness. Pt refused hospital underwear. Pt put on paper scrubs and non-slip socks. Shackles and handcuffs no longer in use.  Autumn [name] hoodie Jeans Autumn [name] sneakers

## 2023-03-03 NOTE — ED Triage Notes (Signed)
Patient arrived with IVC papers that state "respondent has been diagnosed with pi loar and PTSD. She is currently in the [facility] jail and has been incarcerated since MArch 12th 2024. She is refusing to take her meds. She stripped down when face to face with the female who works pretrial. She called him donald trump. She is very manic in her speech and very agitated"

## 2023-03-03 NOTE — ED Notes (Signed)
Patient medicated due to violent behavior towards staff, cursing at staff, threatening staff, throwing all things in locked unit (toilet paper, door flaps, pillow, clothes, and blanket) to barricade door into unit. Patient refusing to put pants on and screaming "put it in my pussy" over and over.
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
## 2023-03-03 NOTE — ED Notes (Signed)
Pt attempted to spit on staff again. Explained to pt if she does this again a mask will be put on her.
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
## 2023-03-03 NOTE — ED Notes (Addendum)
Patient moved to [facility] by wheelchair from quad hallway due to patient taking clothes off in the hallway and being combative, aggressive, and unwilling to receive treatment. When patient stood up from wheelchair, patient's legs were both in one pant leg. Patient refused to let anyone help her throughout transportation, fell unassisted getting out of wheelchair to go in restroom in separate [place] area. Dr. Arnoldo Morale to [facility] area to assess patient. Patient refused to be fully examined at the time (patient to be further examined once she is not combative and safe for patient and staff).

## 2023-03-04 DIAGNOSIS — F312 Bipolar disorder, current episode manic severe with psychotic features: Secondary | ICD-10-CM

## 2023-03-04 MED ORDER — LORAZEPAM 2 MG PO TABS: 2.0000 mg | ORAL_TABLET | Freq: Four times a day (QID) | ORAL | Status: DC | PRN

## 2023-03-04 MED ORDER — OLANZAPINE 10 MG IM SOLR: 10.0000 mg | Freq: Four times a day (QID) | INTRAMUSCULAR | Status: DC | PRN

## 2023-03-04 MED ORDER — OLANZAPINE 10 MG IM SOLR
15.0000 mg | Freq: Four times a day (QID) | INTRAMUSCULAR | Status: DC | PRN
  Administered 2023-03-04: 15 mg via INTRAMUSCULAR
  Filled 2023-03-04: qty 20

## 2023-03-04 MED ORDER — TRAZODONE HCL 100 MG PO TABS
100.0000 mg | ORAL_TABLET | Freq: Every day | ORAL | Status: DC
  Administered 2023-03-04 – 2023-03-11 (×8): 100 mg via ORAL
  Filled 2023-03-04 (×8): qty 1

## 2023-03-04 MED ORDER — OLANZAPINE 10 MG PO TABS
10.0000 mg | ORAL_TABLET | Freq: Three times a day (TID) | ORAL | Status: DC
  Administered 2023-03-04 – 2023-03-12 (×23): 10 mg via ORAL
  Filled 2023-03-04 (×24): qty 1

## 2023-03-04 MED ORDER — DIPHENHYDRAMINE HCL 50 MG/ML IJ SOLN
50.0000 mg | Freq: Three times a day (TID) | INTRAMUSCULAR | Status: DC
  Administered 2023-03-04 – 2023-03-05 (×2): 50 mg via INTRAMUSCULAR
  Filled 2023-03-04 (×2): qty 1

## 2023-03-04 MED ORDER — OLANZAPINE 10 MG IM SOLR
10.0000 mg | Freq: Three times a day (TID) | INTRAMUSCULAR | Status: DC
  Administered 2023-03-04 – 2023-03-05 (×2): 10 mg via INTRAMUSCULAR
  Filled 2023-03-04 (×3): qty 10

## 2023-03-04 MED ORDER — LORAZEPAM 2 MG/ML IJ SOLN: 2.0000 mg | Freq: Four times a day (QID) | INTRAMUSCULAR | Status: DC | PRN

## 2023-03-04 MED ORDER — DIPHENHYDRAMINE HCL 25 MG PO CAPS
50.0000 mg | ORAL_CAPSULE | Freq: Three times a day (TID) | ORAL | Status: DC
  Administered 2023-03-04 – 2023-03-09 (×13): 50 mg via ORAL
  Filled 2023-03-04 (×16): qty 2

## 2023-03-04 NOTE — ED Notes (Signed)
This EDT attempted to obtain vital signs for patient. This tech had the blood pressure cuff and pulse oximetry on the patient and asked the patient if she could leave the pulse oximetry on for a few minutes and patient proceeded to throw pulse oximetry reader and blood pressure cuff at this Clinical research associate. This EDT left the room before any further escalation.

## 2023-03-04 NOTE — ED Notes (Signed)
Pt awake, ambulating calmly around unit. Provided with lunch tray.

## 2023-03-04 NOTE — ED Notes (Signed)
Administered IM medication per MAR. Security X 3 assisted in physical hold to facilitate safe med administration.

## 2023-03-04 NOTE — ED Provider Notes (Signed)
-----------------------------------------   8:17 AM on 03/04/2023 -----------------------------------------   Blood pressure (!) 115/56, pulse (!) 107, temperature (!) 97.5 F (36.4 C), temperature source Oral, resp. rate 18, height 5\' 1"  (1.549 m), weight 45.4 kg, SpO2 98 %.  Patient currently seated on floor, in no distress.  Current plan is for psychiatric admission   Sharyn Creamer, MD 03/04/23 [phone]

## 2023-03-04 NOTE — ED Notes (Signed)
This tech with security attempted vitals again at this time. Upon entering pt stated "did you see the toilet." After saying no pt stated "let me show you." Pt walked to bathroom where a BM was left in toilet. Pt complied when asked to flush the toilet. This NT tried to explain that we needed to grab her vitals. Pt yelling loudly at both this NT and security stating "NO!" Upon further explanation pt took off clothes and screamed "NO!" While turning around in a circle and smacking buttocks and front of chest.

## 2023-03-04 NOTE — ED Notes (Signed)
Gave pt dinner tray and beverage 

## 2023-03-04 NOTE — BH Assessment (Addendum)
Per [facility] AC Tresa Endo), patient to be referred out of system.  Referral information for Psychiatric Hospitalization faxed to;   [facility] [phone]), Per Eunice Blase, no beds available.   [address] [phone]),   [facility] [phone] -or- [phone]),   [facility] [phone]),  [place] [phone])  [facility] [phone])

## 2023-03-04 NOTE — ED Notes (Signed)
Pt has trashed her unit and continues to throw things around through the hour that nurse has been here

## 2023-03-04 NOTE — ED Notes (Addendum)
Pt awoke, knocking on door, asking for "$5 for the vending machine." Instructed patient to stop beating on the door so that we could deliver her breakfast. Pt then took off her clothes and spit on the window. Pt encouraged to put her clothes down and not spit on the door/window.

## 2023-03-04 NOTE — ED Notes (Signed)
IVC/pending psych inpatient admission when medically cleared. 

## 2023-03-04 NOTE — ED Notes (Signed)
Pt returned to bed but continues to yell.

## 2023-03-04 NOTE — ED Notes (Signed)
Pt resting quietly in bed, covered up by blanket. Respirations even and unlabored. No S/S of distress noted.
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
## 2023-03-04 NOTE — ED Notes (Signed)
Pt in shower for 20+ minutes. Offered pt body wash and wash cloths, pt declined. Offered pt clean set of scrubs, pt accepted then threw them on the floor and refused to put anything on. Continues to refuse PO meds and refuses to allow staff to obtain VS.

## 2023-03-04 NOTE — ED Notes (Signed)
Pt is stripping naked in area and attempting to show herself to patients in another area of BHU. Privacy is created and pt educated on dress

## 2023-03-04 NOTE — ED Notes (Signed)
This NT was going to attempt vitals after pt was done eating. Pt eating on bed when she got up to go to the bathroom. After returning to room pt picked up meal tray and drink and threw at the door. Pt proceeded to take off all her clothes and continued to throw things at the door, yelling unintelligibly.

## 2023-03-04 NOTE — ED Notes (Signed)
This tech offered pt a snack. Pt took the snack and said "thank you" and then threw it against the wall.

## 2023-03-04 NOTE — ED Notes (Signed)
Breakfast tray delivered to patient. Pt is eating some of her breakfast and throwing some on the floor.

## 2023-03-05 NOTE — ED Notes (Signed)
Hospital meal provided, pt tolerated w/o complaints.  Waste discarded appropriately.  
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
## 2023-03-05 NOTE — ED Notes (Signed)
Patient sleeping. Will give medications when pt wakes up.

## 2023-03-05 NOTE — ED Notes (Signed)
Patient is IVC pending placement 

## 2023-03-05 NOTE — ED Notes (Signed)
IVC/Pending Placement 
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
## 2023-03-05 NOTE — ED Notes (Signed)
Patient refused vitals except temp.
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
## 2023-03-05 NOTE — ED Notes (Signed)
Lunch tray provided for pt

## 2023-03-05 NOTE — ED Notes (Signed)
Patient was dressed when two security guards, two RN's and techs were at bedside to given IM's. Patient was cursing and still agitated.

## 2023-03-05 NOTE — ED Notes (Signed)
Patient woke easily when this writer went into the room. Patient did not want to speak with this Clinical research associate and said, "Get out, Bitch," when this Clinical research associate offered to turn on the TV. Patient wanted to speak to the female security guard privately.

## 2023-03-05 NOTE — ED Notes (Signed)
Patient is awake, marching around in the closed unit, started knocking on the door, staff went back to see her and she is asking for a ensure. Nurse will try to order her ensure.

## 2023-03-05 NOTE — ED Provider Notes (Signed)
Emergency Medicine Observation Re-evaluation Note  Autumn [name] is a 47 y.o. female, seen on rounds today.  Pt initially presented to the ED for complaints of No chief complaint on file. Currently, the patient is resting comfortably.  Physical Exam  BP 98/82   Pulse 79   Temp 97.6 F (36.4 C)   Resp 16   Ht 5\' 1"  (1.549 m)   Wt 45.4 kg   SpO2 96%   BMI 18.89 kg/m  Physical Exam General: No acute distress Cardiac: Well-perfused extremities Lungs: No respiratory distress Psych: Appropriate mood and affect  ED Course / MDM  EKG:   I have reviewed the labs performed to date as well as medications administered while in observation.  Recent changes in the last 24 hours include none.  Plan  Current plan is for placement.    Merwyn Katos, MD 03/05/23 1600

## 2023-03-05 NOTE — ED Notes (Signed)
Patient in room BHU 8 talking loudly.

## 2023-03-05 NOTE — ED Notes (Signed)
Patient is lying on stretcher, moving easily while covering up with blanket.

## 2023-03-05 NOTE — ED Notes (Signed)
Patient observed on monitor moving around on her bed and rearranging covers.

## 2023-03-05 NOTE — ED Notes (Signed)
Pt asked this RN to turn on TV. When this RN turned on TV, pt stated, "Get out of here bitch. Fuck you bitch". Pt then took off clothes and began yelling and threw cup of water at wall.

## 2023-03-06 NOTE — BH Assessment (Signed)
Referral Checks;   [facility] [phone]), Per Marylene Land, no beds available.   [facility] [phone]), Per Katrina, pt deflected due to recent incarceration.   [facility] [phone] -or- [phone]), Per Leighton Parody, pt denied due to aggressive behavior.  [facility] [phone]), Left voicemail.  [facility] [phone]) No answer.Left voicemail.  [facility] [phone] answer. Mailbox is full.
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
## 2023-03-06 NOTE — ED Notes (Addendum)
During this NT's safety round, pt asked "can I get some new scrubs I've been shitting and throwing up on these" . Pt given new scrub top and pants as requested.

## 2023-03-06 NOTE — ED Notes (Addendum)
Unable to obtain pt oral temp due to pt biting thermometer probe and unwilling to let this NT make a second attempt. Pt given night time snack.

## 2023-03-06 NOTE — BH Assessment (Signed)
Writer spoke with the patient to complete an updated/reassessment. Patient continue to be psychotic and require inpatient treatment. When asked questions, she answered "stop talking to me. I didn't register to vote."

## 2023-03-06 NOTE — ED Notes (Signed)
Patient is IVC pending pending placement

## 2023-03-06 NOTE — ED Provider Notes (Signed)
Emergency Medicine Observation Re-evaluation Note  Autumn [name] is a 47 y.o. female, seen on rounds today.  Pt initially presented to the ED for complaints of No chief complaint on file.   Physical Exam  BP 102/81   Pulse 81   Temp 98.5 F (36.9 C) (Oral)   Resp 17   Ht 5\' 1"  (1.549 m)   Wt 45.4 kg   SpO2 97%   BMI 18.89 kg/m  Physical Exam General: NAD  ED Course / MDM  EKG:   I have reviewed the labs performed to date as well as medications administered while in observation.  Recent changes in the last 24 hours include none.  Plan  Current plan is for placement.    Willy Eddy, MD 03/06/23 [phone]

## 2023-03-06 NOTE — ED Notes (Signed)
Pt given shower supplies, shower turned on for pt to shower. Pt did not take supplies with her but did rinse off. Pt provided with new scrubs.

## 2023-03-07 DIAGNOSIS — F312 Bipolar disorder, current episode manic severe with psychotic features: Secondary | ICD-10-CM

## 2023-03-07 NOTE — BH Assessment (Signed)
Per [facility], patient to be referred out of system.  Referral information for Psychiatric Hospitalization re-faxed to;   [facility] [phone]- [phone]) No available beds  [facility] [phone]- [phone]),   [facility] [phone]),Facility at capacity  [facility] [phone]),   [facility] [phone]- [phone]),   [facility] [phone])
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
## 2023-03-07 NOTE — ED Notes (Signed)
Pt/Ivc rec pysch admit when medically cleared.

## 2023-03-08 DIAGNOSIS — F312 Bipolar disorder, current episode manic severe with psychotic features: Secondary | ICD-10-CM | POA: Diagnosis not present

## 2023-03-08 NOTE — ED Notes (Addendum)
Provided with lunch tray, ensure, and water in pt room

## 2023-03-08 NOTE — ED Notes (Signed)
Pt given dinner tray and beverage  

## 2023-03-08 NOTE — ED Notes (Signed)
Pt given shower supplies and water turned on so pt can get cleaned up. Pt had loose stool and vomit on her bed and blanket. Pt states "I'm not puking and shitting on myself anymore and I want something to eat after I shower". Pt provided with breakfast tray at the bedside and clean linens.

## 2023-03-08 NOTE — ED Provider Notes (Signed)
Emergency Medicine Observation Re-evaluation Note  Autumn [name] is a 47 y.o. female, seen on rounds today.  Pt initially presented to the ED for complaints of No chief complaint on file.  Currently, the patient is is no acute distress.   Physical Exam  Blood pressure 126/73, pulse 89, temperature 98.4 F (36.9 C), temperature source Oral, resp. rate 17, height 5\' 1"  (1.549 m), weight 45.4 kg, SpO2 96 %.  Physical Exam: General: No apparent distress Pulm: Normal WOB Neuro: Moving all extremities Psych: Resting comfortably     ED Course / MDM    I have reviewed the labs performed to date as well as medications administered while in observation.  Recent changes in the last 24 hours include: No acute events overnight.  Currently restarted on lithium, Abilify, Depakote and Seroquel.  As needed Zyprexa with Benadryl.  Recommended inpatient psychiatric hospitalization.  Patient has been medically cleared.  Plan   Current plan: Patient awaiting inpatient psychiatric hospitalization Patient is under full IVC at this time.    Corena Herter, MD 03/08/23 [phone]

## 2023-03-08 NOTE — ED Notes (Signed)
NP at the bedside for pt assessment

## 2023-03-08 NOTE — ED Notes (Signed)
Pt offered snack and water upon waking. Pt refused.
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
## 2023-03-08 NOTE — ED Notes (Signed)
Pt. refused snack and water

## 2023-03-08 NOTE — BH Assessment (Addendum)
Referral information for Psychiatric Hospitalization re-faxed to;    [facility] [phone]- [phone]) No available beds   [facility] [phone]),    [facility] [phone]), Facility at capacity   [facility] [phone]), No answer   [facility] [phone] -or- [phone]),    [facility] [phone])  [facility] [phone])

## 2023-03-09 MED ORDER — DIPHENHYDRAMINE HCL 25 MG PO CAPS
50.0000 mg | ORAL_CAPSULE | Freq: Two times a day (BID) | ORAL | Status: DC
  Administered 2023-03-10 – 2023-03-12 (×5): 50 mg via ORAL
  Filled 2023-03-09 (×5): qty 2

## 2023-03-09 MED ORDER — DIPHENHYDRAMINE HCL 50 MG/ML IJ SOLN: 50.0000 mg | Freq: Two times a day (BID) | INTRAMUSCULAR | Status: DC

## 2023-03-09 NOTE — ED Notes (Signed)
Pt provided snack.

## 2023-03-09 NOTE — ED Notes (Signed)
Patient talked to the tech and security guard when housekeeping come back to clean the area, she is calm and cooperative, she is dressed and laying on the bed.

## 2023-03-09 NOTE — ED Notes (Signed)
Pt is sleeping.Pt breakfast is being held til pt is up.

## 2023-03-09 NOTE — ED Notes (Signed)
ivc/pending placement.. 

## 2023-03-09 NOTE — ED Provider Notes (Signed)
Emergency Medicine Observation Re-evaluation Note  Autumn [name] is a 47 y.o. female, seen on rounds today.  Pt initially presented to the ED for complaints of No chief complaint on file.   Physical Exam  BP 131/72 (BP Location: Right Arm)   Pulse 94   Temp 99 F (37.2 C) (Oral)   Resp 18   Ht 5\' 1"  (1.549 m)   Wt 45.4 kg   SpO2 97%   BMI 18.89 kg/m  Physical Exam General: No acute distress Lungs: Normal work of breathing Psych: Calm, no agitation  ED Course / MDM  EKG:   I have reviewed the labs performed to date as well as medications administered while in observation.  Recent changes in the last 24 hours include none.   Plan  Current plan is for inpatient psych admission.  Patient is currently under IVC.Marland Kitchen    Georga Hacking, MD 03/09/23 [phone]

## 2023-03-09 NOTE — ED Notes (Signed)
IVC/Pending Placement 

## 2023-03-10 LAB — URINALYSIS, COMPLETE (UACMP) WITH MICROSCOPIC
Bilirubin Urine: NEGATIVE
Glucose, UA: NEGATIVE mg/dL
Hgb urine dipstick: NEGATIVE
Ketones, ur: 5 mg/dL — AB
Leukocytes,Ua: NEGATIVE
Nitrite: NEGATIVE
Protein, ur: NEGATIVE mg/dL
Specific Gravity, Urine: 1.018 (ref 1.005–1.030)
pH: 7 (ref 5.0–8.0)

## 2023-03-10 LAB — PREGNANCY, URINE: Preg Test, Ur: NEGATIVE

## 2023-03-10 MED ORDER — ONDANSETRON 4 MG PO TBDP
4.0000 mg | ORAL_TABLET | Freq: Once | ORAL | Status: AC
  Administered 2023-03-10: 4 mg via ORAL
  Filled 2023-03-10: qty 1

## 2023-03-10 NOTE — ED Notes (Signed)
Dinner tray given

## 2023-03-10 NOTE — ED Notes (Signed)
Patient continues to be loud, but directable. Patient stated she would take shower if orange juice given. Orange juice given, but patient refusing to get in shower. Patient did change clothes to clean set and took AM medications.
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
## 2023-03-10 NOTE — ED Notes (Signed)
After speaking with Dr. Arnoldo Morale on pt condition, pt allowed to eat. Pt offered snacks and drink that would be appropriate for situation, pt refuses and states that she will wait for breakfast in the AM and denies any drink. Pt was offered multiple times but refuses.

## 2023-03-10 NOTE — ED Notes (Signed)
Pt made aware of need for urine sample.  

## 2023-03-10 NOTE — ED Provider Notes (Signed)
Emergency Medicine Observation Re-evaluation Note  Autumn [name] is a 47 y.o. female, seen on rounds today.  Pt initially presented to the ED for complaints of No chief complaint on file.  Currently, the patient is no acute distress. Resting- no issues per bhu nurse   Physical Exam  Blood pressure 105/75, pulse (!) 104, temperature 98.2 F (36.8 C), temperature source Oral, resp. rate 17, height 5\' 1"  (1.549 m), weight 45.4 kg, SpO2 99 %.  Physical Exam General: No apparent distress Pulm: Normal WOB Psych: resting     ED Course / MDM    I have reviewed the labs performed to date as well as medications administered while in observation.  Recent changes in the last 24 hours include none  Plan   Current plan is to continue to wait for psych plan/placement if felt warranted  Patient is under full IVC at this time.   Concha Se, MD 03/10/23 [phone]

## 2023-03-10 NOTE — ED Notes (Addendum)
While doing our nightly round on Pt. RN & I noticed Pt had thrown up and had diarrhea on her floor and her bed in her RM. Pt was lying in her bed that was covered in dry bowel. Pt did not have pants on and had bowel on her top as well. RN & I cleaned her RM &  RM8 where Pt had been using to eat her meals in. EDT Mayra assisted Pt to take a shower. Pt stated she has been sick off and on since she has been in the unit

## 2023-03-10 NOTE — ED Notes (Signed)
IVC/Pending Placement 

## 2023-03-10 NOTE — ED Notes (Signed)
Pt noted to have dried vomit all over floor beside bed and loose stool that trailed from bed to toilet, loose stool on toilet seat that was from last earlier. Pt reports she has had loose stool for several days and vomiting. Pt was sitting in stool on the bed wrapped by blanket with no pants due to everything having stool on it. Unit cleaned by staff and and EVS. Pt is calm and cooperative, reports ill feelings, unable to eat through day today. Pt provided with items to shower, clean self, new linen and scrubs. Dr. Arnoldo Morale is notified of pt condition and complaints, she reports she will be to bedside shortly. Pt is in shower at this time.

## 2023-03-11 ENCOUNTER — Inpatient Hospital Stay: Payer: Medicaid Other

## 2023-03-11 ENCOUNTER — Encounter: Payer: Self-pay | Admitting: Internal Medicine

## 2023-03-11 DIAGNOSIS — E876 Hypokalemia: Secondary | ICD-10-CM

## 2023-03-11 DIAGNOSIS — F319 Bipolar disorder, unspecified: Secondary | ICD-10-CM | POA: Diagnosis not present

## 2023-03-11 DIAGNOSIS — Z820 Family history of epilepsy and other diseases of the nervous system: Secondary | ICD-10-CM | POA: Diagnosis not present

## 2023-03-11 DIAGNOSIS — Z8249 Family history of ischemic heart disease and other diseases of the circulatory system: Secondary | ICD-10-CM | POA: Diagnosis not present

## 2023-03-11 DIAGNOSIS — G40909 Epilepsy, unspecified, not intractable, without status epilepticus: Secondary | ICD-10-CM | POA: Diagnosis present

## 2023-03-11 DIAGNOSIS — Z5902 Unsheltered homelessness: Secondary | ICD-10-CM | POA: Diagnosis not present

## 2023-03-11 DIAGNOSIS — F312 Bipolar disorder, current episode manic severe with psychotic features: Secondary | ICD-10-CM | POA: Diagnosis present

## 2023-03-11 DIAGNOSIS — Z635 Disruption of family by separation and divorce: Secondary | ICD-10-CM | POA: Diagnosis not present

## 2023-03-11 DIAGNOSIS — F1190 Opioid use, unspecified, uncomplicated: Secondary | ICD-10-CM | POA: Diagnosis present

## 2023-03-11 DIAGNOSIS — T426X1A Poisoning by other antiepileptic and sedative-hypnotic drugs, accidental (unintentional), initial encounter: Secondary | ICD-10-CM | POA: Insufficient documentation

## 2023-03-11 DIAGNOSIS — F1510 Other stimulant abuse, uncomplicated: Secondary | ICD-10-CM | POA: Diagnosis present

## 2023-03-11 DIAGNOSIS — K589 Irritable bowel syndrome without diarrhea: Secondary | ICD-10-CM | POA: Diagnosis present

## 2023-03-11 DIAGNOSIS — R7989 Other specified abnormal findings of blood chemistry: Secondary | ICD-10-CM | POA: Insufficient documentation

## 2023-03-11 DIAGNOSIS — R32 Unspecified urinary incontinence: Secondary | ICD-10-CM | POA: Diagnosis present

## 2023-03-11 DIAGNOSIS — T426X5A Adverse effect of other antiepileptic and sedative-hypnotic drugs, initial encounter: Secondary | ICD-10-CM | POA: Diagnosis present

## 2023-03-11 DIAGNOSIS — Z8241 Family history of sudden cardiac death: Secondary | ICD-10-CM | POA: Diagnosis not present

## 2023-03-11 DIAGNOSIS — F1410 Cocaine abuse, uncomplicated: Secondary | ICD-10-CM | POA: Diagnosis present

## 2023-03-11 DIAGNOSIS — F1010 Alcohol abuse, uncomplicated: Secondary | ICD-10-CM | POA: Diagnosis present

## 2023-03-11 DIAGNOSIS — Z8659 Personal history of other mental and behavioral disorders: Secondary | ICD-10-CM | POA: Diagnosis not present

## 2023-03-11 DIAGNOSIS — Z83438 Family history of other disorder of lipoprotein metabolism and other lipidemia: Secondary | ICD-10-CM | POA: Diagnosis not present

## 2023-03-11 DIAGNOSIS — F17210 Nicotine dependence, cigarettes, uncomplicated: Secondary | ICD-10-CM | POA: Diagnosis present

## 2023-03-11 DIAGNOSIS — Z888 Allergy status to other drugs, medicaments and biological substances status: Secondary | ICD-10-CM | POA: Diagnosis not present

## 2023-03-11 DIAGNOSIS — Z79899 Other long term (current) drug therapy: Secondary | ICD-10-CM | POA: Diagnosis not present

## 2023-03-11 DIAGNOSIS — F1910 Other psychoactive substance abuse, uncomplicated: Secondary | ICD-10-CM | POA: Insufficient documentation

## 2023-03-11 DIAGNOSIS — Z1152 Encounter for screening for COVID-19: Secondary | ICD-10-CM | POA: Diagnosis not present

## 2023-03-11 DIAGNOSIS — R159 Full incontinence of feces: Secondary | ICD-10-CM | POA: Diagnosis present

## 2023-03-11 DIAGNOSIS — Z825 Family history of asthma and other chronic lower respiratory diseases: Secondary | ICD-10-CM | POA: Diagnosis not present

## 2023-03-11 LAB — URINALYSIS, COMPLETE (UACMP) WITH MICROSCOPIC
Bilirubin Urine: NEGATIVE
Glucose, UA: NEGATIVE mg/dL
Hgb urine dipstick: NEGATIVE
Ketones, ur: NEGATIVE mg/dL
Leukocytes,Ua: NEGATIVE
Nitrite: NEGATIVE
Protein, ur: NEGATIVE mg/dL
Specific Gravity, Urine: 1.003 — ABNORMAL LOW (ref 1.005–1.030)
pH: 8 (ref 5.0–8.0)

## 2023-03-11 LAB — CBC WITH DIFFERENTIAL/PLATELET
Abs Immature Granulocytes: 0.04 10*3/uL (ref 0.00–0.07)
Basophils Absolute: 0 10*3/uL (ref 0.0–0.1)
Basophils Relative: 0 %
Eosinophils Absolute: 0.4 10*3/uL (ref 0.0–0.5)
Eosinophils Relative: 5 %
HCT: 42.4 % (ref 36.0–46.0)
Hemoglobin: 14.5 g/dL (ref 12.0–15.0)
Immature Granulocytes: 0 %
Lymphocytes Relative: 18 %
Lymphs Abs: 1.6 10*3/uL (ref 0.7–4.0)
MCH: 31.4 pg (ref 26.0–34.0)
MCHC: 34.2 g/dL (ref 30.0–36.0)
MCV: 91.8 fL (ref 80.0–100.0)
Monocytes Absolute: 0.6 10*3/uL (ref 0.1–1.0)
Monocytes Relative: 6 %
Neutro Abs: 6.2 10*3/uL (ref 1.7–7.7)
Neutrophils Relative %: 71 %
Platelets: 170 10*3/uL (ref 150–400)
RBC: 4.62 MIL/uL (ref 3.87–5.11)
RDW: 11.2 % — ABNORMAL LOW (ref 11.5–15.5)
WBC: 8.9 10*3/uL (ref 4.0–10.5)
nRBC: 0 % (ref 0.0–0.2)

## 2023-03-11 LAB — COMPREHENSIVE METABOLIC PANEL
ALT: 11 U/L (ref 0–44)
AST: 13 U/L — ABNORMAL LOW (ref 15–41)
Albumin: 3.6 g/dL (ref 3.5–5.0)
Alkaline Phosphatase: 52 U/L (ref 38–126)
Anion gap: 9 (ref 5–15)
BUN: 11 mg/dL (ref 6–20)
CO2: 29 mmol/L (ref 22–32)
Calcium: 9.2 mg/dL (ref 8.9–10.3)
Chloride: 99 mmol/L (ref 98–111)
Creatinine, Ser: 0.65 mg/dL (ref 0.44–1.00)
GFR, Estimated: >60 mL/min (ref >60)
Glucose, Bld: 93 mg/dL (ref 70–99)
Potassium: 3 mmol/L — ABNORMAL LOW (ref 3.5–5.1)
Sodium: 137 mmol/L (ref 135–145)
Total Bilirubin: 0.5 mg/dL (ref 0.3–1.2)
Total Protein: 6.5 g/dL (ref 6.5–8.1)

## 2023-03-11 LAB — LITHIUM LEVEL
Lithium Lvl: 1.88 mmol/L (ref 0.60–1.20)
Lithium Lvl: 1.72 mmol/L (ref 0.60–1.20)
Lithium Lvl: 1.4 mmol/L — ABNORMAL HIGH (ref 0.60–1.20)
Lithium Lvl: 1.2 mmol/L (ref 0.60–1.20)

## 2023-03-11 LAB — AMMONIA: Ammonia: 136 umol/L — ABNORMAL HIGH (ref 9–35)

## 2023-03-11 LAB — TSH: TSH: 1.467 u[IU]/mL (ref 0.350–4.500)

## 2023-03-11 LAB — LACTIC ACID, PLASMA
Lactic Acid, Venous: 3.4 mmol/L (ref 0.5–1.9)
Lactic Acid, Venous: 2.5 mmol/L (ref 0.5–1.9)

## 2023-03-11 LAB — VALPROIC ACID LEVEL
Valproic Acid Lvl: 139 ug/mL — ABNORMAL HIGH (ref 50.0–100.0)
Valproic Acid Lvl: 138 ug/mL — ABNORMAL HIGH (ref 50.0–100.0)
Valproic Acid Lvl: 98 ug/mL (ref 50.0–100.0)

## 2023-03-11 LAB — MAGNESIUM: Magnesium: 1.9 mg/dL (ref 1.7–2.4)

## 2023-03-11 MED ORDER — SODIUM CHLORIDE 0.9 % IV BOLUS
1000.0000 mL | Freq: Once | INTRAVENOUS | Status: AC
  Administered 2023-03-11: 1000 mL via INTRAVENOUS

## 2023-03-11 MED ORDER — SODIUM CHLORIDE 0.9 % IV SOLN: INTRAVENOUS | Status: DC

## 2023-03-11 MED ORDER — DIVALPROEX SODIUM 500 MG PO DR TAB: 750.0000 mg | DELAYED_RELEASE_TABLET | Freq: Every day | ORAL | Status: DC

## 2023-03-11 MED ORDER — ACETAMINOPHEN 325 MG PO TABS: 650.0000 mg | ORAL_TABLET | Freq: Four times a day (QID) | ORAL | Status: DC | PRN

## 2023-03-11 MED ORDER — NICOTINE 21 MG/24HR TD PT24: 21.0000 mg | MEDICATED_PATCH | Freq: Every day | TRANSDERMAL | Status: DC | PRN

## 2023-03-11 MED ORDER — LEVOCARNITINE 1 GM/10ML PO SOLN
1000.0000 mg | Freq: Two times a day (BID) | ORAL | Status: DC
  Administered 2023-03-11 – 2023-03-12 (×2): 1000 mg via ORAL
  Filled 2023-03-11 (×2): qty 10

## 2023-03-11 MED ORDER — POTASSIUM CITRATE-CITRIC ACID 1100-334 MG/5ML PO SOLN
20.0000 meq | Freq: Three times a day (TID) | ORAL | Status: DC
  Filled 2023-03-11 (×3): qty 10

## 2023-03-11 MED ORDER — DIVALPROEX SODIUM 500 MG PO DR TAB: 500.0000 mg | DELAYED_RELEASE_TABLET | Freq: Every day | ORAL | Status: DC

## 2023-03-11 MED ORDER — ENOXAPARIN SODIUM 40 MG/0.4ML IJ SOSY: 40.0000 mg | PREFILLED_SYRINGE | Freq: Every day | INTRAMUSCULAR | Status: DC

## 2023-03-11 MED ORDER — SODIUM CHLORIDE 0.9 % IV SOLN: Freq: Once | INTRAVENOUS | Status: AC

## 2023-03-11 MED ORDER — ACETAMINOPHEN 325 MG RE SUPP: 650.0000 mg | Freq: Four times a day (QID) | RECTAL | Status: DC | PRN

## 2023-03-11 MED ORDER — ONDANSETRON HCL 4 MG PO TABS: 4.0000 mg | ORAL_TABLET | Freq: Four times a day (QID) | ORAL | Status: DC | PRN

## 2023-03-11 MED ORDER — LACTULOSE 10 GM/15ML PO SOLN: 30.0000 g | Freq: Two times a day (BID) | ORAL | Status: DC

## 2023-03-11 MED ORDER — ONDANSETRON HCL 4 MG/2ML IJ SOLN: 4.0000 mg | Freq: Four times a day (QID) | INTRAMUSCULAR | Status: DC | PRN

## 2023-03-11 MED ORDER — SENNOSIDES-DOCUSATE SODIUM 8.6-50 MG PO TABS: 1.0000 | ORAL_TABLET | Freq: Every evening | ORAL | Status: DC | PRN

## 2023-03-11 MED ORDER — POTASSIUM CITRATE-CITRIC ACID 1100-334 MG/5ML PO SOLN
20.0000 meq | Freq: Three times a day (TID) | ORAL | Status: DC
  Filled 2023-03-11 (×2): qty 10

## 2023-03-11 MED ORDER — ENOXAPARIN SODIUM 40 MG/0.4ML IJ SOSY
40.0000 mg | PREFILLED_SYRINGE | INTRAMUSCULAR | Status: DC
Start: 2023-03-11 — End: 2023-03-11

## 2023-03-11 NOTE — ED Notes (Signed)
Pt offered snack and water. Pt denied both at this time.

## 2023-03-11 NOTE — ED Provider Notes (Signed)
Emergency Medicine Observation Re-evaluation Note  Physical Exam   BP 109/73 (BP Location: Left Arm)   Pulse (!) 105   Temp 98.9 F (37.2 C) (Oral)   Resp 16   Ht 5\' 1"  (1.549 m)   Wt 45.4 kg   SpO2 94%   BMI 18.89 kg/m   Patient appears in no acute distress.  ED Course / MDM   No reported events during my shift at the time of this note.   Pt is awaiting dispo from SW   Pilar Jarvis MD    Pilar Jarvis, MD 03/11/23 [phone]

## 2023-03-11 NOTE — ED Notes (Signed)
Pt moving to room 23, report given to Sue Lush, [place]

## 2023-03-11 NOTE — ED Notes (Signed)
Hospital meal provided, pt tolerated w/o complaints.  Waste discarded appropriately.  

## 2023-03-11 NOTE — BH Assessment (Signed)
Update: Pt was faxed out to [facility].

## 2023-03-11 NOTE — ED Notes (Signed)
Poison control recommendations related to ammonia and drug levels.  If valproic and lithium levels are not rising, may d/c q4hour check and check both levels in AM.  DO NOT recommend lactulose to treat ammonia.  Recommend L-Carnatine 1gm po bid.  Also recommend recheck of ammonia and CMP in AM.  Continue to recommend IV maintenance fluids to keep urine output at 1.2cc/kg/hr.

## 2023-03-11 NOTE — ED Notes (Signed)
IVC papers renewed by provider. Received a phone call from magistrate office stating papers were denied due to not enough info from provider.
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
## 2023-03-11 NOTE — Assessment & Plan Note (Signed)
Etiology workup in progress, check portable chest x-ray stat, procalcitonin, will initiate antibiotic therapy if appropriate

## 2023-03-11 NOTE — ED Provider Notes (Addendum)
11:28 AM Assumed care for off going team.   Blood pressure 100/77, pulse 89, temperature (!) 97.5 F (36.4 C), temperature source Oral, resp. rate 15, height 5\' 1"  (1.549 m), weight 45.4 kg, SpO2 96 %.  See their HPI for full report but in brief pt noted to have elevated depakote and lithium levels after restarting meds-   Recommend patient be moved over to main for IV fluids, cardiac monitoring.  I discussed recommend discussing with poison control who is ordered labs.  I discussed with Dr. Toni Amend who felt most likely will clear in 12-24 hours.  Will hold off on admission and start fluids, motnior labs   EKG is sinus rate of 91 without any ST elevation or T wave inversions, QTc is normal at 415.  Reevaluated patient she reported feeling somewhat nauseous but otherwise denies any significant symptoms.   Marland Kitchen1-3 Lead EKG Interpretation  Performed by: Concha Se, MD Authorized by: Concha Se, MD     Interpretation: normal     ECG rate:  80   ECG rate assessment: normal     Rhythm: sinus rhythm     Ectopy: none     Conduction: normal    LFTS normal but ammonia elevated - pt handed off to oncoming team dr Derrill Kay  going to look into what to do with poison control given ammonia elevation.     Concha Se, MD 03/11/23 1232    Concha Se, MD 03/11/23 1357    Concha Se, MD 03/11/23 [phone]

## 2023-03-11 NOTE — ED Notes (Signed)
Pt refused snack but received a drink 

## 2023-03-11 NOTE — ED Notes (Signed)
Pt. offered shower. Pt. refused at this time and stated "I took one last night".

## 2023-03-11 NOTE — BH Assessment (Addendum)
Patient referred to [facility], pending review.   Pt. referred to [facility]. Verbal Screening not yet completed, will contact facility once referral has been received.   Update 6:15am: Verbal screening completed with [facility]([phone]). Information completed and faxed to [facility]. Referral to be passed along to nurse to be reviewed, facility should contact TTS back if not TTS to contact back to check on referral and wait list status.

## 2023-03-11 NOTE — ED Notes (Signed)
Critical lab called in from lab, lithium 1.88, Molli Knock, NP notified of lab result.

## 2023-03-11 NOTE — Assessment & Plan Note (Signed)
-   Registered dietitian consulted 

## 2023-03-11 NOTE — Assessment & Plan Note (Signed)
Depakote level is downtrending Will continue valproic level checks, scheduled for 2100, every 8 hours, 2 checks ordered

## 2023-03-11 NOTE — Hospital Course (Addendum)
Ms. Nautia Lerner is a 47 year old female with history of bipolar 1, with mania, psychosis, history of IBS, major depressive disorder, alcohol abuse, methamphetamine abuse, history of anorexia nervosa,polysubstance abuse including cocaine, heroin, tobacco, alcohol, methamphetamine, opioid use, who presents emergency department for chief concerns of agitation and mania on 03/03/2023.  On 5/3, critical call from lab, with lithium level of 1.88.  Per report, behavioral health nurse practitioner has been notified of lab results.  Poison control was called by ED staff and per staff note: recommends EKG, CMP, BUN, serum creatinine, lactic acid check, and ammonia concentrate level, liver enzymes, recheck valproic acid level, lithium level every 4 hours to make sure levels are trending down.  Rehydrate patient but do not give a lot of saline all at once, continuous cardiac monitoring to check for QTc prolongation.  Goal urine output is 1.2 mL/kg/hr.  Vitals on day of admission showed temperature 97.5, respiration rate of 15, blood pressure 113/73, heart rate of 89, SpO2 of 96% on room air.  Serum sodium level 137, potassium 3.0, chloride 99, bicarb 29, BUN of 11, serum creatinine of 0.65, EGFR greater than 60, nonfasting blood glucose 93, WBC 8.9, hemoglobin 14.5, platelets of 170.  Lithium level 1.88.  Depakote level 139.  TSH was within normal limits. Ammonia level is 136.  Lactic acid 3.4.  ED treatment: Sodium chloride 1 L x 2 followed by sodium chloride infusion at 150 mL/h  5/4: Vitals normal.  Valproic acid has been normalized rather little less than therapeutic range at 49, lithium levels has been normalized at 0.87, ammonia normalized at 28, CBC consistent with some dilutional effect, potassium 3.6, procalcitonin negative, chest x-ray with no acute abnormality.  Patient is stable to go back to behavioral health.  Holding Depakote and lithium and her psychiatrist can restart at an appropriate doses.   She will continue her current medications which were started at behavioral health.  Patient will need to monitor her electrolytes and levels very closely to avoid any further intoxication.  Patient is being discharged to behavioral health.

## 2023-03-11 NOTE — Assessment & Plan Note (Signed)
Check serum magnesium level Polycitra 20 mill equivalent p.o., 3 times daily, 3 doses ordered Repeat BMP in the a.m.

## 2023-03-11 NOTE — Assessment & Plan Note (Signed)
Lithium level is downtrending as of appropriate Per ED staff, poison control recommends repeating check in the a.m. Lithium level change from every 4 hours to every 8 hours starting at 2100, 2 times check ordered Continue IV fluid maintenance

## 2023-03-11 NOTE — ED Notes (Addendum)
Called poison control, they recommended the following:-  EKG, CMP, BUN, Creatine, lactic acid, ammonia concentrate level, liver enzymes Recheck Valproic acid level and lithium level Q4hrs to make sure levels are trending down To rehydrate pt but not to give lots of saline all at once Continuous cardiac monitoring to check for QTC prolongation Urine output at 1.2 cc/kg/hr

## 2023-03-11 NOTE — ED Notes (Signed)
Patient being medically monitored for admission at this time.  Every 15 minute rounding being done.

## 2023-03-11 NOTE — ED Notes (Signed)
Pt. woke up. Pt. given breakfast tray and juice. No other needs voiced at this time.

## 2023-03-11 NOTE — Assessment & Plan Note (Signed)
Including heroin, methamphetamine, cocaine, alcohol, tobacco, opioids

## 2023-03-11 NOTE — ED Notes (Signed)
IVC papers renewed/pt pending placement

## 2023-03-11 NOTE — ED Provider Notes (Signed)
Poison control also recommending strict output monitoring. Given complexity of case do feel patient would benefit from inpatient admission. Discussed with Dr. Sedalia Muta with the hospitalist service who will plan on admission.   Phineas Semen, MD 03/11/23 [phone]

## 2023-03-11 NOTE — H&P (Signed)
History and Physical   Autumn [name] WUJ:[id number] DOB: 1976-04-01 DOA: 03/03/2023  PCP: Pcp, No  [facility]  I have personally briefly reviewed patient's old medical records in [facility] EMR.  Chief Concern: confusion, agitation  HPI: [name] is a 47 year old female with history of bipolar 1, with mania, psychosis, history of IBS, major depressive disorder, alcohol abuse, methamphetamine abuse, history of anorexia nervosa,polysubstance abuse including cocaine, heroin, tobacco, alcohol, methamphetamine, opioid use, who presents emergency department for chief concerns of agitation and mania on 03/03/2023.  On 5/3, critical call from lab, with lithium level of 1.88.  Per report, behavioral health nurse practitioner has been notified of lab results.  Poison control was called by ED staff and per staff note: recommends EKG, CMP, BUN, serum creatinine, lactic acid check, and ammonia concentrate level, liver enzymes, recheck valproic acid level, lithium level every 4 hours to make sure levels are trending down.  Rehydrate patient but do not give a lot of saline all at once, continuous cardiac monitoring to check for QTc prolongation.  Goal urine output is 1.2 mL/kg/hr.  Vitals on day of admission showed temperature 97.5, respiration rate of 15, blood pressure 113/73, heart rate of 89, SpO2 of 96% on room air.  Serum sodium level 137, potassium 3.0, chloride 99, bicarb 29, BUN of 11, serum creatinine of 0.65, EGFR greater than 60, nonfasting blood glucose 93, WBC 8.9, hemoglobin 14.5, platelets of 170.  Lithium level 1.88.  Depakote level 139.  TSH was within normal limits. Ammonia level is 136.  Lactic acid 3.4.  ED treatment: Sodium chloride 1 L x 2 followed by sodium chloride infusion at 150 mL/h ----------------------------- At bedside, patient was sleeping.  Patient was able to tell me her name, age, location, current year.  She denies something solid.  She  is requesting for nicotine patch and she states she needs to use the restroom.  She denies nausea, vomiting, chest, dysuria, hematuria, diarrhea.  She denies cough.  Social history: She is currently homeless and lives in a tent with her boyfriend.  She endorses tobacco use EtOH and recreational drug use occluding heroin, methamphetamine, cocaine.  ROS: Constitutional: no weight change, no fever ENT/Mouth: no sore throat, no rhinorrhea Eyes: no eye pain, no vision changes Cardiovascular: no chest pain, no dyspnea,  no edema, no palpitations Respiratory: no cough, no sputum, no wheezing Gastrointestinal: no nausea, no vomiting, no diarrhea, no constipation Genitourinary: no urinary incontinence, no dysuria, no hematuria Musculoskeletal: no arthralgias, no myalgias Skin: no skin lesions, no pruritus, Neuro: + weakness, no loss of consciousness, no syncope Psych: no anxiety, no depression, no decrease appetite Heme/Lymph: no bruising, no bleeding  ED Course: Discussed with emergency medicine provider, patient requiring hospitalization for chief concerns of lithium and Depakote toxicity with hypokalemia.  Assessment/Plan  Principal Problem:   Hypokalemia Active Problems:   History of anorexia nervosa   Bipolar affective disorder, current episode manic with psychotic symptoms (HCC)   IBS (irritable bowel syndrome)   Polysubstance abuse (HCC)   Elevated lactic acid level   Elevated lithium level   Valproic acid toxicity   Assessment and Plan:  * Hypokalemia Check serum magnesium level Polycitra 20 mill equivalent p.o., 3 times daily, 3 doses ordered Repeat BMP in the a.m.  Valproic acid toxicity Depakote level is downtrending Will continue valproic level checks, scheduled for 2100, every 8 hours, 2 checks ordered  Elevated lithium level Lithium level is downtrending as of appropriate Per  ED staff, poison control recommends repeating check in the a.m. Lithium level change from  every 4 hours to every 8 hours starting at 2100, 2 times check ordered Continue IV fluid maintenance  Elevated lactic acid level Etiology workup in progress, check portable chest x-ray stat, procalcitonin, will initiate antibiotic therapy if appropriate  Polysubstance abuse (HCC) Including heroin, methamphetamine, cocaine, alcohol, tobacco, opioids  History of anorexia nervosa Registered dietitian consulted  Chart reviewed.   DVT prophylaxis: Enoxaparin Code Status: Full code Diet: Regular diet Family Communication: A phone call was offered, patient declined Disposition Plan: Pending clinical course, anticipate discharge for admission to behavioral health services Consults called: Behavioral health Admission status: Telemetry medical, inpatient  Past Medical History:  Diagnosis Date   Allergy    Seasonal, Ultram (seizures)   Anemia 2005   Bipolar 1 disorder (HCC)    Glaucoma    Seizures (HCC) 2008   Past Surgical History:  Procedure Laterality Date   BREAST EXCISIONAL BIOPSY Right    x 2   CESAREAN SECTION     x 2   LEEP  2000   Social History:  reports that she has been smoking cigarettes. She has a 11.25 pack-year smoking history. She has quit using smokeless tobacco.  Her smokeless tobacco use included chew. She reports current alcohol use of about 8.0 standard drinks of alcohol per week. She reports that she does not currently use drugs after having used the following drugs: Marijuana, Cocaine, Heroin, Methamphetamines, and MDMA (Ecstacy). Frequency: 1.00 time per week.  Allergies  Allergen Reactions   Haldol [Haloperidol Lactate]     Patient states that she gets stiff muscles when taking Haldol   Ultram [Tramadol] Other (See Comments)    Seizures   Wellbutrin [Bupropion]    Ziprasidone Hcl     Other reaction(s): Other (See Comments) PER PT WHEN SHE TAKES THIS SHE CANT MOVE HER NECK   Family History  Problem Relation Age of Onset   Hypertension Mother     Hyperlipidemia Mother    Asthma Mother    Parkinson's disease Father    Tourette syndrome Daughter    Lung cancer Maternal Grandmother    Alzheimer's disease Maternal Grandmother    Aneurysm Maternal Grandfather        brain   Other Paternal Grandfather 61       Cardiac arrest   Heart disease Paternal Grandfather    Family history: Family history reviewed and not pertinent.  Prior to Admission medications   Medication Sig Start Date End Date Taking? Authorizing Provider  albuterol (VENTOLIN HFA) 108 (90 Base) MCG/ACT inhaler Inhale 1-2 puffs into the lungs every 6 (six) hours as needed for wheezing or shortness of breath. 09/01/22   Iloabachie, Chioma E, NP  ARIPiprazole (ABILIFY) 15 MG tablet Take 1 tablet (15 mg total) by mouth at bedtime. 08/02/22   Georga Hacking, MD  benzonatate (TESSALON PERLES) 100 MG capsule Take 1 capsule (100 mg total) by mouth 3 (three) times daily as needed. 09/02/22   Iloabachie, Chioma E, NP  divalproex (DEPAKOTE) 250 MG DR tablet Take 5 tablets (1,250 mg total) by mouth at bedtime. 08/02/22   Georga Hacking, MD  guaiFENesin (ROBITUSSIN) 100 MG/5ML liquid Take 5 mLs (100 mg total) by mouth every 4 (four) hours as needed for cough or to loosen phlegm. 09/02/22   Iloabachie, Chioma E, NP  hydrOXYzine (ATARAX) 25 MG tablet Take 1 tablet (25 mg total) by mouth 3 (three) times daily as  needed for anxiety. 08/02/22   Georga Hacking, MD  lithium carbonate (ESKALITH) 450 MG CR tablet Take 2 tablets (900 mg total) by mouth at bedtime. 08/02/22   Georga Hacking, MD  nicotine polacrilex (NICORETTE) 2 MG gum Chew one piece of gum by mouth as directed on package as needed for smoking cessation. 08/02/22   Georga Hacking, MD  QUEtiapine (SEROQUEL) 300 MG tablet Take 1 tablet (300 mg total) by mouth at bedtime. 08/02/22   Georga Hacking, MD    Physical Exam: Vitals:   03/11/23 1400 03/11/23 1430 03/11/23 1500 03/11/23 1530  BP: 103/70 106/67 92/63  103/65  Pulse: 89 96 96 94  Resp: 17 (!) 22 18 (!) 25  Temp:      TempSrc:      SpO2: 100% 100% 98% 98%  Weight:      Height:       Constitutional: appears older than chronological age, thin, NAD, calm Eyes: PERRL, lids and conjunctivae normal ENMT: Mucous membranes are moist. Posterior pharynx clear of any exudate or lesions. Age-appropriate dentition. Hearing appropriate Neck: normal, supple, no masses, no thyromegaly Respiratory: clear to auscultation bilaterally, no wheezing, no crackles. Normal respiratory effort. No accessory muscle use.  Cardiovascular: Regular rate and rhythm, no murmurs / rubs / gallops. No extremity edema. 2+ pedal pulses. No carotid bruits.  Abdomen: no tenderness, no masses palpated, no hepatosplenomegaly. Bowel sounds positive.  Musculoskeletal: no clubbing / cyanosis. No joint deformity upper and lower extremities. Good ROM, no contractures, no atrophy. Normal muscle tone.  Skin: no rashes, lesions, ulcers. No induration Neurologic: Sensation intact. Strength 5/5 in all 4.  Psychiatric: Normal judgment and insight. Alert and oriented x 3. Normal mood.   EKG: independently reviewed, showing sinus rhythm with rate of 91, QTc 415  Chest x-ray on Admission: I personally reviewed and I agree with radiologist reading as below.  DG Chest Port 1 View  Result Date: 03/11/2023 CLINICAL DATA:  Shortness of breath EXAM: PORTABLE CHEST 1 VIEW COMPARISON:  Radiograph 12/25/2021 FINDINGS: Unchanged cardiomediastinal silhouette. There is no new focal airspace disease. There is no large effusion or evidence of pneumothorax. There is no acute osseous abnormality. IMPRESSION: No evidence of acute cardiopulmonary disease. Electronically Signed   By: Caprice Renshaw M.D.   On: 03/11/2023 17:07    Labs on Admission: I have personally reviewed following labs CBC: Recent Labs  Lab 03/11/23 1237  WBC 8.9  NEUTROABS 6.2  HGB 14.5  HCT 42.4  MCV 91.8  PLT 170   Basic Metabolic  Panel: Recent Labs  Lab 03/11/23 1237  NA 137  K 3.0*  CL 99  CO2 29  GLUCOSE 93  BUN 11  CREATININE 0.65  CALCIUM 9.2   GFR: Estimated Creatinine Clearance: 63.6 mL/min (by C-G formula based on SCr of 0.65 mg/dL).  Liver Function Tests: Recent Labs  Lab 03/11/23 1237  AST 13*  ALT 11  ALKPHOS 52  BILITOT 0.5  PROT 6.5  ALBUMIN 3.6   Recent Labs  Lab 03/11/23 1237  AMMONIA 136*   Thyroid Function Tests: Recent Labs    03/11/23 1034  TSH 1.467   Urine analysis:    Component Value Date/Time   COLORURINE YELLOW (A) 03/10/2023 2030   APPEARANCEUR HAZY (A) 03/10/2023 2030   APPEARANCEUR Clear 01/10/2014 1545   LABSPEC 1.018 03/10/2023 2030   LABSPEC 1.011 01/10/2014 1545   PHURINE 7.0 03/10/2023 2030   GLUCOSEU NEGATIVE 03/10/2023 2030   GLUCOSEU Negative  01/10/2014 1545   HGBUR NEGATIVE 03/10/2023 2030   BILIRUBINUR NEGATIVE 03/10/2023 2030   BILIRUBINUR Negative 01/10/2014 1545   KETONESUR 5 (A) 03/10/2023 2030   PROTEINUR NEGATIVE 03/10/2023 2030   NITRITE NEGATIVE 03/10/2023 2030   LEUKOCYTESUR NEGATIVE 03/10/2023 2030   LEUKOCYTESUR Negative 01/10/2014 1545   This document was prepared using Dragon Voice Recognition software and may include unintentional dictation errors.  Dr. Sedalia Muta [facility]  If 7PM-7AM, please contact overnight-coverage provider If 7AM-7PM, please contact day attending provider [email or web address]  03/11/2023, 5:19 PM

## 2023-03-12 DIAGNOSIS — F319 Bipolar disorder, unspecified: Secondary | ICD-10-CM

## 2023-03-12 DIAGNOSIS — R7989 Other specified abnormal findings of blood chemistry: Secondary | ICD-10-CM

## 2023-03-12 DIAGNOSIS — F312 Bipolar disorder, current episode manic severe with psychotic features: Secondary | ICD-10-CM

## 2023-03-12 DIAGNOSIS — T426X1A Poisoning by other antiepileptic and sedative-hypnotic drugs, accidental (unintentional), initial encounter: Secondary | ICD-10-CM

## 2023-03-12 DIAGNOSIS — E876 Hypokalemia: Secondary | ICD-10-CM | POA: Diagnosis not present

## 2023-03-12 LAB — URINE DRUG SCREEN, QUALITATIVE (ARMC ONLY)
Amphetamines, Ur Screen: NOT DETECTED
Barbiturates, Ur Screen: NOT DETECTED
Benzodiazepine, Ur Scrn: NOT DETECTED
Cannabinoid 50 Ng, Ur ~~LOC~~: NOT DETECTED
Cocaine Metabolite,Ur ~~LOC~~: NOT DETECTED
MDMA (Ecstasy)Ur Screen: NOT DETECTED
Methadone Scn, Ur: NOT DETECTED
Opiate, Ur Screen: NOT DETECTED
Phencyclidine (PCP) Ur S: NOT DETECTED
Tricyclic, Ur Screen: POSITIVE — AB

## 2023-03-12 LAB — CBC
HCT: 33.6 % — ABNORMAL LOW (ref 36.0–46.0)
Hemoglobin: 11 g/dL — ABNORMAL LOW (ref 12.0–15.0)
MCH: 30.9 pg (ref 26.0–34.0)
MCHC: 32.7 g/dL (ref 30.0–36.0)
MCV: 94.4 fL (ref 80.0–100.0)
Platelets: 148 10*3/uL — ABNORMAL LOW (ref 150–400)
RBC: 3.56 MIL/uL — ABNORMAL LOW (ref 3.87–5.11)
RDW: 11.3 % — ABNORMAL LOW (ref 11.5–15.5)
WBC: 9.1 10*3/uL (ref 4.0–10.5)
nRBC: 0 % (ref 0.0–0.2)

## 2023-03-12 LAB — LITHIUM LEVEL: Lithium Lvl: 0.87 mmol/L (ref 0.60–1.20)

## 2023-03-12 LAB — BASIC METABOLIC PANEL
Anion gap: 3 — ABNORMAL LOW (ref 5–15)
BUN: 8 mg/dL (ref 6–20)
CO2: 26 mmol/L (ref 22–32)
Calcium: 8.4 mg/dL — ABNORMAL LOW (ref 8.9–10.3)
Chloride: 110 mmol/L (ref 98–111)
Creatinine, Ser: 0.45 mg/dL (ref 0.44–1.00)
GFR, Estimated: >60 mL/min (ref >60)
Glucose, Bld: 94 mg/dL (ref 70–99)
Potassium: 3.6 mmol/L (ref 3.5–5.1)
Sodium: 139 mmol/L (ref 135–145)

## 2023-03-12 LAB — VALPROIC ACID LEVEL: Valproic Acid Lvl: 49 ug/mL — ABNORMAL LOW (ref 50.0–100.0)

## 2023-03-12 LAB — PROCALCITONIN: Procalcitonin: <0.1 ng/mL

## 2023-03-12 LAB — SARS CORONAVIRUS 2 BY RT PCR: SARS Coronavirus 2 by RT PCR: NEGATIVE

## 2023-03-12 LAB — HIV ANTIBODY (ROUTINE TESTING W REFLEX): HIV Screen 4th Generation wRfx: NONREACTIVE

## 2023-03-12 LAB — AMMONIA: Ammonia: 28 umol/L (ref 9–35)

## 2023-03-12 MED ORDER — OLANZAPINE 10 MG PO TABS: 10.0000 mg | ORAL_TABLET | Freq: Three times a day (TID) | ORAL | Status: DC

## 2023-03-12 MED ORDER — TRAZODONE HCL 100 MG PO TABS: 100.0000 mg | ORAL_TABLET | Freq: Every day | ORAL | Status: DC

## 2023-03-12 MED ORDER — LEVOCARNITINE 1 GM/10ML PO SOLN: 1000.0000 mg | Freq: Two times a day (BID) | ORAL | 12 refills | Status: DC

## 2023-03-12 MED ORDER — QUETIAPINE FUMARATE 100 MG PO TABS: 100.0000 mg | ORAL_TABLET | Freq: Every day | ORAL | Status: DC

## 2023-03-12 MED ORDER — ENSURE ENLIVE PO LIQD: 237.0000 mL | Freq: Two times a day (BID) | ORAL | Status: DC

## 2023-03-12 MED ORDER — DIVALPROEX SODIUM 250 MG PO DR TAB: 1250.0000 mg | DELAYED_RELEASE_TABLET | Freq: Every day | ORAL | 2 refills | Status: DC

## 2023-03-12 MED ORDER — LITHIUM CARBONATE ER 450 MG PO TBCR: 900.0000 mg | EXTENDED_RELEASE_TABLET | Freq: Every day | ORAL | 2 refills | Status: DC

## 2023-03-12 MED ORDER — NICOTINE 21 MG/24HR TD PT24: 21.0000 mg | MEDICATED_PATCH | Freq: Every day | TRANSDERMAL | 0 refills | Status: DC | PRN

## 2023-03-12 MED ORDER — POTASSIUM CITRATE-CITRIC ACID 1100-334 MG/5ML PO SOLN
20.0000 meq | Freq: Three times a day (TID) | ORAL | Status: DC
  Administered 2023-03-12: 20 meq via ORAL
  Filled 2023-03-12 (×2): qty 10

## 2023-03-12 MED ORDER — ENSURE ENLIVE PO LIQD: 237.0000 mL | Freq: Two times a day (BID) | ORAL | 12 refills | Status: DC

## 2023-03-12 NOTE — ED Notes (Signed)
Patient is IVC pending placement 

## 2023-03-12 NOTE — ED Notes (Signed)
Attending MD updated diet order from NPO to regular diet.

## 2023-03-12 NOTE — Consult Note (Addendum)
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  HENT:     Head: Normocephalic.     Nose: Nose normal.  Pulmonary:     Effort: Pulmonary effort is normal.  Musculoskeletal:        General: Normal range of motion.     Cervical back: Normal range of motion.  Neurological:     General: No focal deficit present.     Mental Status: She is alert and oriented to person, place, and time.  Psychiatric:        Attention and Perception: Attention and perception normal.        Mood and Affect: Mood is anxious.        Speech: Speech  normal.        Behavior: Behavior normal. Behavior is cooperative.        Thought Content: Thought content normal. Thought content does not include homicidal or suicidal ideation. Thought content does not include homicidal or suicidal plan.        Cognition and Memory: Memory is impaired.        Judgment: Judgment is impulsive.     Review of Systems  Psychiatric/Behavioral:  Positive for agitation, behavioral problems and confusion. The patient is nervous/anxious.   All other systems reviewed and are negative.   Blood pressure 98/66, pulse 83, temperature 98.2 F (36.8 C), temperature source Oral, resp. rate (!) 21, height 5\' 1"  (1.549 m), weight 45.4 kg, SpO2 98 %.Body mass index is 18.89 kg/m.  General Appearance: Disheveled  Eye Contact:  Good  Speech:  WDL  Volume:  WDL  Mood:  Anxious  Affect:  Congruent  Thought Process:  WDL  Orientation:  A & O x 3  Thought Content:  Logical, clear  Suicidal Thoughts:  No  Homicidal Thoughts:  No  Memory:  Fair  Judgement:  Fair  Insight:  Lacking  Psychomotor Activity:  WDL  Concentration:  Fair  Recall:  [employer] of Knowledge:  Fair  Language:  Good  Akathisia:  No  Handed:  Right  AIMS (if indicated):     Assets:  Leisure Time Resilience  ADL's:  Intact  Cognition:  WDL  Sleep:        Physical Exam: Physical Exam Vitals and nursing note reviewed.  HENT:     Head: Normocephalic.     Nose: Nose normal.  Pulmonary:     Effort: Pulmonary effort is normal.  Musculoskeletal:        General: Normal range of motion.     Cervical back: Normal range of motion.  Neurological:     General: No focal deficit present.     Mental Status: She is alert and oriented to person, place, and time.  Psychiatric:        Attention and Perception: Attention and perception normal.        Mood and Affect: Mood is anxious.        Speech: Speech normal.        Behavior: Behavior normal. Behavior is cooperative.        Thought Content:  Thought content normal. Thought content does not include homicidal or suicidal ideation. Thought content does not include homicidal or suicidal plan.        Cognition and Memory: Memory is impaired.  Judgment: Judgment is impulsive.    Review of Systems  Psychiatric/Behavioral:  Positive for agitation, behavioral problems and confusion. The patient is nervous/anxious.   All other systems reviewed and are negative.  Blood pressure 98/66, pulse 83, temperature 98.2 F (36.8 C), temperature source Oral, resp. rate (!) 21, height 5\' 1"  (1.549 m), weight 45.4 kg, SpO2 98 %. Body mass index is 18.89 kg/m.  Treatment Plan Summary: Daily contact with patient to assess and evaluate symptoms and progress in treatment, Medication management, and Plan   PLAN:  Bipolar disorder: Abilify 15 mg PO daily Seroquel 100 mg PO daily HS  PRNs:  Zyprexa 10 mg IM with Benadryl 50 mg PRN agitation changed to TID as she is not taking oral medications this am--the Zyprexa is working better for her per staff, "the only thing that calmed her"  Disposition: Recommend psychiatric Inpatient admission when medically cleared. Supportive therapy provided about ongoing stressors. Discussed crisis plan, support from social network, calling 911, coming to the Emergency Department, and calling Suicide Hotline.  Nanine Means, NP 03/12/2023 11:04 AM

## 2023-03-12 NOTE — ED Notes (Signed)
IVC/pending medical admission ?

## 2023-03-12 NOTE — BH Assessment (Signed)
TTS faxed updated clinicals along with most recent COVID results (per request of [facility]).   Patient has been accepted to [facility].  Patient assigned to room TBD. Accepting physician is Dr. Pershing Proud.  Call report to [phone].  Representative was Dianna Animator).   ER Staff is aware of it:  Ronnie, ER Secretary  Dr. Marisa Severin, ER MD  Durene Cal, Patient's Nurse        Bed is ready and pt can transport to facility today (03/12/23).

## 2023-03-12 NOTE — Discharge Summary (Signed)
Physician Discharge Summary   Patient: Autumn [name] MRN: [id number] DOB: 1976/04/01  Admit date:     03/03/2023  Discharge date: 03/12/23  Discharge Physician: Arnetha Courser   PCP: Pcp, No   Recommendations at discharge:  Please obtain CBC, BMP, Depakote and lithium levels within a week.   Discharge Diagnoses: Principal Problem:   Hypokalemia Active Problems:   History of anorexia nervosa   Bipolar affective disorder, current episode manic with psychotic symptoms (HCC)   IBS (irritable bowel syndrome)   Polysubstance abuse (HCC)   Elevated lactic acid level   Elevated lithium level   Valproic acid toxicity   Bipolar affective disorder, currently manic, severe, with psychotic features (HCC)  Resolved Problems:   Affective psychosis, bipolar (HCC)   MDD (major depressive disorder), recurrent severe, without psychosis (HCC)   Bipolar 1 disorder [facility])  Hospital Course: Autumn [name] is a 47 year old female with history of bipolar 1, with mania, psychosis, history of IBS, major depressive disorder, alcohol abuse, methamphetamine abuse, history of anorexia nervosa,polysubstance abuse including cocaine, heroin, tobacco, alcohol, methamphetamine, opioid use, who presents emergency department for chief concerns of agitation and mania on 03/03/2023.  On 5/3, critical call from lab, with lithium level of 1.88.  Per report, behavioral health nurse practitioner has been notified of lab results.  Poison control was called by ED staff and per staff note: recommends EKG, CMP, BUN, serum creatinine, lactic acid check, and ammonia concentrate level, liver enzymes, recheck valproic acid level, lithium level every 4 hours to make sure levels are trending down.  Rehydrate patient but do not give a lot of saline all at once, continuous cardiac monitoring to check for QTc prolongation.  Goal urine output is 1.2 mL/kg/hr.  Vitals on day of admission showed temperature 97.5, respiration rate of 15,  blood pressure 113/73, heart rate of 89, SpO2 of 96% on room air.  Serum sodium level 137, potassium 3.0, chloride 99, bicarb 29, BUN of 11, serum creatinine of 0.65, EGFR greater than 60, nonfasting blood glucose 93, WBC 8.9, hemoglobin 14.5, platelets of 170.  Lithium level 1.88.  Depakote level 139.  TSH was within normal limits. Ammonia level is 136.  Lactic acid 3.4.  ED treatment: Sodium chloride 1 L x 2 followed by sodium chloride infusion at 150 mL/h  5/4: Vitals normal.  Valproic acid has been normalized rather little less than therapeutic range at 49, lithium levels has been normalized at 0.87, ammonia normalized at 28, CBC consistent with some dilutional effect, potassium 3.6, procalcitonin negative, chest x-ray with no acute abnormality.  Patient is stable to go back to behavioral health.  Holding Depakote and lithium and her psychiatrist can restart at an appropriate doses.  She will continue her current medications which were started at behavioral health.  Patient will need to monitor her electrolytes and levels very closely to avoid any further intoxication.  Patient is being discharged to behavioral health.  Assessment and Plan: * Hypokalemia Check serum magnesium level Polycitra 20 mill equivalent p.o., 3 times daily, 3 doses ordered Repeat BMP in the a.m.  Valproic acid toxicity Depakote level is downtrending Will continue valproic level checks, scheduled for 2100, every 8 hours, 2 checks ordered  Elevated lithium level Lithium level is downtrending as of appropriate Per ED staff, poison control recommends repeating check in the a.m. Lithium level change from every 4 hours to every 8 hours starting at 2100, 2 times check ordered Continue IV fluid maintenance  Elevated lactic acid level Etiology  workup in progress, check portable chest x-ray stat, procalcitonin, will initiate antibiotic therapy if appropriate  Polysubstance abuse (HCC) Including heroin,  methamphetamine, cocaine, alcohol, tobacco, opioids  History of anorexia nervosa Registered dietitian consulted   Consultants: Psychiatry Procedures performed: None Disposition:  Behavioral health Diet recommendation:  Discharge Diet Orders (From admission, onward)     Start     Ordered   03/12/23 0000  Diet - low sodium heart healthy        03/12/23 1120           Regular diet DISCHARGE MEDICATION: Allergies as of 03/12/2023       Reactions   Haldol [haloperidol Lactate]    Patient states that she gets stiff muscles when taking Haldol   Ultram [tramadol] Other (See Comments)   Seizures   Wellbutrin [bupropion]    Ziprasidone Hcl    Other reaction(s): Other (See Comments) PER PT WHEN SHE TAKES THIS SHE CANT MOVE HER NECK        Medication List     TAKE these medications    albuterol 108 (90 Base) MCG/ACT inhaler Commonly known as: VENTOLIN HFA Inhale 1-2 puffs into the lungs every 6 (six) hours as needed for wheezing or shortness of breath.   ARIPiprazole 15 MG tablet Commonly known as: ABILIFY Take 1 tablet (15 mg total) by mouth at bedtime.   benzonatate 100 MG capsule Commonly known as: Tessalon Perles Take 1 capsule (100 mg total) by mouth 3 (three) times daily as needed.   divalproex 250 MG DR tablet Commonly known as: DEPAKOTE Take 5 tablets (1,250 mg total) by mouth at bedtime. Hold until seen by psychiatrist What changed: additional instructions   feeding supplement Liqd Take 237 mLs by mouth 2 (two) times daily between meals.   hydrOXYzine 25 MG tablet Commonly known as: ATARAX Take 1 tablet (25 mg total) by mouth 3 (three) times daily as needed for anxiety.   levOCARNitine 1 GM/10ML solution Commonly known as: CARNITOR Take 10 mLs (1,000 mg total) by mouth 2 (two) times daily.   lithium carbonate 450 MG ER tablet Commonly known as: ESKALITH Take 2 tablets (900 mg total) by mouth at bedtime. Hold until restarted by a psychiatrist What  changed: additional instructions   nicotine 21 mg/24hr patch Commonly known as: NICODERM CQ - dosed in mg/24 hours Place 1 patch (21 mg total) onto the skin daily as needed (nicotine craving).   nicotine polacrilex 2 MG gum Commonly known as: NICORETTE Chew one piece of gum by mouth as directed on package as needed for smoking cessation.   OLANZapine 10 MG tablet Commonly known as: ZYPREXA Take 1 tablet (10 mg total) by mouth 3 (three) times daily.   QUEtiapine 100 MG tablet Commonly known as: SEROQUEL Take 1 tablet (100 mg total) by mouth at bedtime. What changed:  medication strength how much to take   traZODone 100 MG tablet Commonly known as: DESYREL Take 1 tablet (100 mg total) by mouth at bedtime.   Tusnel-EX 100 MG/5ML liquid Generic drug: guaiFENesin Take 5 mLs (100 mg total) by mouth every 4 (four) hours as needed for cough or to loosen phlegm.        Discharge Exam: Filed Weights   03/03/23 1259  Weight: 45.4 kg   General.  Malnourished lady, in no acute distress. Pulmonary.  Lungs clear bilaterally, normal respiratory effort. CV.  Regular rate and rhythm, no JVD, rub or murmur. Abdomen.  Soft, nontender, nondistended, BS positive. CNS.  Alert and oriented .  No focal neurologic deficit. Extremities.  No edema, no cyanosis, pulses intact and symmetrical. Psychiatry.  Judgment and insight appears normal.   Condition at discharge: stable  The results of significant diagnostics from this hospitalization (including imaging, microbiology, ancillary and laboratory) are listed below for reference.   Imaging Studies: DG Chest Port 1 View  Result Date: 03/11/2023 CLINICAL DATA:  Shortness of breath EXAM: PORTABLE CHEST 1 VIEW COMPARISON:  Radiograph 12/25/2021 FINDINGS: Unchanged cardiomediastinal silhouette. There is no new focal airspace disease. There is no large effusion or evidence of pneumothorax. There is no acute osseous abnormality. IMPRESSION: No  evidence of acute cardiopulmonary disease. Electronically Signed   By: Caprice Renshaw M.D.   On: 03/11/2023 17:07    Microbiology: Results for orders placed or performed during the hospital encounter of 01/10/23  Resp panel by RT-PCR (RSV, Flu A&B, Covid) Urine, Clean Catch     Status: None   Collection Time: 01/10/23  5:32 PM   Specimen: Urine, Clean Catch; Nasal Swab  Result Value Ref Range Status   SARS Coronavirus 2 by RT PCR NEGATIVE NEGATIVE Final    Comment: (NOTE) SARS-CoV-2 target nucleic acids are NOT DETECTED.  The SARS-CoV-2 RNA is generally detectable in upper respiratory specimens during the acute phase of infection. The lowest concentration of SARS-CoV-2 viral copies this assay can detect is 138 copies/mL. A negative result does not preclude SARS-Cov-2 infection and should not be used as the sole basis for treatment or other patient management decisions. A negative result may occur with  improper specimen collection/handling, submission of specimen other than nasopharyngeal swab, presence of viral mutation(s) within the areas targeted by this assay, and inadequate number of viral copies(<138 copies/mL). A negative result must be combined with clinical observations, patient history, and epidemiological information. The expected result is Negative.  Fact Sheet for Patients:  BloggerCourse.com  Fact Sheet for Healthcare Providers:  SeriousBroker.it  This test is no t yet approved or cleared by the [place] FDA and  has been authorized for detection and/or diagnosis of SARS-CoV-2 by FDA under an Emergency Use Authorization (EUA). This EUA will remain  in effect (meaning this test can be used) for the duration of the COVID-19 declaration under Section 564(b)(1) of the Act, 21 U.S.C.section 360bbb-3(b)(1), unless the authorization is terminated  or revoked sooner.       Influenza A by PCR NEGATIVE NEGATIVE Final    Influenza B by PCR NEGATIVE NEGATIVE Final    Comment: (NOTE) The Xpert Xpress SARS-CoV-2/FLU/RSV plus assay is intended as an aid in the diagnosis of influenza from Nasopharyngeal swab specimens and should not be used as a sole basis for treatment. Nasal washings and aspirates are unacceptable for Xpert Xpress SARS-CoV-2/FLU/RSV testing.  Fact Sheet for Patients: BloggerCourse.com  Fact Sheet for Healthcare Providers: SeriousBroker.it  This test is not yet approved or cleared by the [place] FDA and has been authorized for detection and/or diagnosis of SARS-CoV-2 by FDA under an Emergency Use Authorization (EUA). This EUA will remain in effect (meaning this test can be used) for the duration of the COVID-19 declaration under Section 564(b)(1) of the Act, 21 U.S.C. section 360bbb-3(b)(1), unless the authorization is terminated or revoked.     Resp Syncytial Virus by PCR NEGATIVE NEGATIVE Final    Comment: (NOTE) Fact Sheet for Patients: BloggerCourse.com  Fact Sheet for Healthcare Providers: SeriousBroker.it  This test is not yet approved or cleared by the [place] FDA and has been  authorized for detection and/or diagnosis of SARS-CoV-2 by FDA under an Emergency Use Authorization (EUA). This EUA will remain in effect (meaning this test can be used) for the duration of the COVID-19 declaration under Section 564(b)(1) of the Act, 21 U.S.C. section 360bbb-3(b)(1), unless the authorization is terminated or revoked.  Performed at [facility], [address] [place], [place] [address]     Labs: CBC: Recent Labs  Lab 03/11/23 1237 03/12/23 0504  WBC 8.9 9.1  NEUTROABS 6.2  --   HGB 14.5 11.0*  HCT 42.4 33.6*  MCV 91.8 94.4  PLT 170 148*   Basic Metabolic Panel: Recent Labs  Lab 03/11/23 1237 03/11/23 1816 03/12/23 0504  NA 137  --  139  K  3.0*  --  3.6  CL 99  --  110  CO2 29  --  26  GLUCOSE 93  --  94  BUN 11  --  8  CREATININE 0.65  --  0.45  CALCIUM 9.2  --  8.4*  MG  --  1.9  --    Liver Function Tests: Recent Labs  Lab 03/11/23 1237  AST 13*  ALT 11  ALKPHOS 52  BILITOT 0.5  PROT 6.5  ALBUMIN 3.6   CBG: No results for input(s): "GLUCAP" in the last 168 hours.  Discharge time spent: greater than 30 minutes.  This record has been created using Conservation officer, historic buildings. Errors have been sought and corrected,but may not always be located. Such creation errors do not reflect on the standard of care.   Signed: Arnetha Courser, MD [facility] 03/12/2023

## 2023-03-12 NOTE — ED Notes (Signed)
BREAKFAST TRAY GIVEN 

## 2023-03-12 NOTE — BH Assessment (Signed)
This Clinical research associate spoke to [facility] representative ([phone]), who'd called to confirm pt would be transported later today 03/12/23. Writer informed Amore that pt is being medically monitored for admission. Amore verbalized an understanding of the information discussed, agreeing to pass the information to the dayshift provider. Amore advised that there is no guarantee pt's bed will be held; however there is a possibility.

## 2023-07-04 ENCOUNTER — Emergency Department: Payer: MEDICAID

## 2023-07-04 ENCOUNTER — Other Ambulatory Visit: Payer: Self-pay

## 2023-07-04 ENCOUNTER — Encounter: Payer: Self-pay | Admitting: Emergency Medicine

## 2023-07-04 ENCOUNTER — Emergency Department
Admission: EM | Admit: 2023-07-04 | Discharge: 2023-07-04 | Disposition: A | Discharge status: Home / Self Care | Payer: MEDICAID | Attending: Emergency Medicine | Admitting: Emergency Medicine

## 2023-07-04 DIAGNOSIS — Z20822 Contact with and (suspected) exposure to covid-19: Secondary | ICD-10-CM | POA: Diagnosis not present

## 2023-07-04 DIAGNOSIS — R110 Nausea: Secondary | ICD-10-CM

## 2023-07-04 DIAGNOSIS — R1084 Generalized abdominal pain: Secondary | ICD-10-CM | POA: Diagnosis not present

## 2023-07-04 DIAGNOSIS — R109 Unspecified abdominal pain: Secondary | ICD-10-CM | POA: Diagnosis present

## 2023-07-04 LAB — COMPREHENSIVE METABOLIC PANEL
ALT: 10 U/L (ref 0–44)
AST: 10 U/L — ABNORMAL LOW (ref 15–41)
Albumin: 4.3 g/dL (ref 3.5–5.0)
Alkaline Phosphatase: 43 U/L (ref 38–126)
Anion gap: 7 (ref 5–15)
BUN: 17 mg/dL (ref 6–20)
CO2: 23 mmol/L (ref 22–32)
Calcium: 9.2 mg/dL (ref 8.9–10.3)
Chloride: 108 mmol/L (ref 98–111)
Creatinine, Ser: 0.66 mg/dL (ref 0.44–1.00)
GFR, Estimated: >60 mL/min (ref >60)
Glucose, Bld: 127 mg/dL — ABNORMAL HIGH (ref 70–99)
Potassium: 3.5 mmol/L (ref 3.5–5.1)
Sodium: 138 mmol/L (ref 135–145)
Total Bilirubin: 0.6 mg/dL (ref 0.3–1.2)
Total Protein: 7 g/dL (ref 6.5–8.1)

## 2023-07-04 LAB — URINALYSIS, ROUTINE W REFLEX MICROSCOPIC
Bilirubin Urine: NEGATIVE
Glucose, UA: NEGATIVE mg/dL
Ketones, ur: NEGATIVE mg/dL
Leukocytes,Ua: NEGATIVE
Nitrite: NEGATIVE
Protein, ur: 30 mg/dL — AB
Specific Gravity, Urine: 1.028 (ref 1.005–1.030)
pH: 5 (ref 5.0–8.0)

## 2023-07-04 LAB — CBC
HCT: 40.5 % (ref 36.0–46.0)
Hemoglobin: 13.8 g/dL (ref 12.0–15.0)
MCH: 31.4 pg (ref 26.0–34.0)
MCHC: 34.1 g/dL (ref 30.0–36.0)
MCV: 92.3 fL (ref 80.0–100.0)
Platelets: 239 10*3/uL (ref 150–400)
RBC: 4.39 MIL/uL (ref 3.87–5.11)
RDW: 11.4 % — ABNORMAL LOW (ref 11.5–15.5)
WBC: 7.6 10*3/uL (ref 4.0–10.5)
nRBC: 0 % (ref 0.0–0.2)

## 2023-07-04 LAB — LIPASE, BLOOD: Lipase: 40 U/L (ref 11–51)

## 2023-07-04 LAB — SARS CORONAVIRUS 2 BY RT PCR: SARS Coronavirus 2 by RT PCR: NEGATIVE

## 2023-07-04 LAB — POC URINE PREG, ED: Preg Test, Ur: NEGATIVE

## 2023-07-04 MED ORDER — ONDANSETRON HCL 4 MG/2ML IJ SOLN
4.0000 mg | Freq: Once | INTRAMUSCULAR | Status: AC
  Administered 2023-07-04: 4 mg via INTRAVENOUS
  Filled 2023-07-04: qty 2

## 2023-07-04 MED ORDER — ONDANSETRON 4 MG PO TBDP
4.0000 mg | ORAL_TABLET | Freq: Once | ORAL | Status: AC
  Administered 2023-07-04: 4 mg via ORAL
  Filled 2023-07-04: qty 1

## 2023-07-04 MED ORDER — SODIUM CHLORIDE 0.9 % IV BOLUS
1000.0000 mL | Freq: Once | INTRAVENOUS | Status: AC
  Administered 2023-07-04: 1000 mL via INTRAVENOUS

## 2023-07-04 MED ORDER — ONDANSETRON 4 MG PO TBDP: 4.0000 mg | ORAL_TABLET | Freq: Three times a day (TID) | ORAL | 0 refills | Status: DC | PRN

## 2023-07-04 MED ORDER — IOHEXOL 9 MG/ML PO SOLN: 500.0000 mL | Freq: Once | ORAL | Status: DC | PRN

## 2023-07-04 MED ORDER — MORPHINE SULFATE (PF) 4 MG/ML IV SOLN
4.0000 mg | Freq: Once | INTRAVENOUS | Status: AC
  Administered 2023-07-04: 4 mg via INTRAVENOUS
  Filled 2023-07-04: qty 1

## 2023-07-04 MED ORDER — IOHEXOL 300 MG/ML  SOLN
80.0000 mL | Freq: Once | INTRAMUSCULAR | Status: AC | PRN
  Administered 2023-07-04: 80 mL via INTRAVENOUS

## 2023-07-04 NOTE — ED Notes (Signed)
Patient transported to CT 

## 2023-07-04 NOTE — ED Notes (Signed)
Pt ambulated to restroom and back to bed safely.

## 2023-07-04 NOTE — ED Provider Notes (Signed)
[facility] Provider Note    Event Date/Time   First MD Initiated Contact with Patient 07/04/23 1759     (approximate)   History   Abdominal Pain   HPI  Autumn [name] is a 47 y.o. female with history of substance abuse, EtOH abuse, glaucoma and bipolar along with seizures presents emergency department with abdominal pain.  Patient states she has had some brownish blood due to her menstrual cycle.  Denies other vaginal discharge.  Has nausea but no vomiting.  No diarrhea.  No fever or chills.      Physical Exam   Triage Vital Signs: ED Triage Vitals [07/04/23 1627]  Encounter Vitals Group     BP 102/61     Systolic BP Percentile      Diastolic BP Percentile      Pulse Rate (!) 57     Resp 18     Temp 98.3 F (36.8 C)     Temp Source Oral     SpO2 100 %     Weight 105 lb (47.6 kg)     Height 5\' 2"  (1.575 m)     Head Circumference      Peak Flow      Pain Score 8     Pain Loc      Pain Education      Exclude from Growth Chart     Most recent vital signs: Vitals:   07/04/23 1627  BP: 102/61  Pulse: (!) 57  Resp: 18  Temp: 98.3 F (36.8 C)  SpO2: 100%     General: Awake, no distress.   CV:  Good peripheral perfusion. regular rate and  rhythm Resp:  Normal effort. Lungs cta Abd:  No distention.  Minimally tender in the lower quads bilaterally Other:      ED Results / Procedures / Treatments   Labs (all labs ordered are listed, but only abnormal results are displayed) Labs Reviewed  COMPREHENSIVE METABOLIC PANEL - Abnormal; Notable for the following components:      Result Value   Glucose, Bld 127 (*)    AST 10 (*)    All other components within normal limits  CBC - Abnormal; Notable for the following components:   RDW 11.4 (*)    All other components within normal limits  URINALYSIS, ROUTINE W REFLEX MICROSCOPIC - Abnormal; Notable for the following components:   Color, Urine AMBER (*)    APPearance CLOUDY (*)    Hgb  urine dipstick LARGE (*)    Protein, ur 30 (*)    Bacteria, UA RARE (*)    All other components within normal limits  SARS CORONAVIRUS 2 BY RT PCR  LIPASE, BLOOD  POC URINE PREG, ED     EKG     RADIOLOGY CT abdomen pelvis with IV contrast    PROCEDURES:   Procedures   MEDICATIONS ORDERED IN ED: Medications  iohexol (OMNIPAQUE) 9 MG/ML oral solution 500 mL (has no administration in time range)  ondansetron (ZOFRAN) injection 4 mg (4 mg Intravenous Given 07/04/23 1905)  sodium chloride 0.9 % bolus 1,000 mL (0 mLs Intravenous Stopped 07/04/23 2006)  morphine (PF) 4 MG/ML injection 4 mg (4 mg Intravenous Given 07/04/23 1925)  iohexol (OMNIPAQUE) 300 MG/ML solution 80 mL (80 mLs Intravenous Contrast Given 07/04/23 2027)  ondansetron (ZOFRAN-ODT) disintegrating tablet 4 mg (4 mg Oral Given 07/04/23 2138)     IMPRESSION / MDM / ASSESSMENT AND PLAN / ED COURSE  I reviewed  the triage vital signs and the nursing notes.                              Differential diagnosis includes, but is not limited to, COVID, UTI, STD, acute appendicitis, kidney stone, acute cholecystitis  Patient's presentation is most consistent with acute illness / injury with system symptoms.   Patient's labs are reassuring  CT abdomen pelvis with contrast, did independently review and interpret the radiologist reading as being negative for any acute abnormality.  I did explain the findings to the patient.  States she is feeling much better after the Zofran and fluids.  He is requesting another Zofran prior to discharge.  I did reexamine her abdomen which is nontender at this time.  She is to follow-up with her regular doctor in 3 days if not improving.  Return emergency department worsening.  Prescription for Zofran ODT was sent to her pharmacy.      FINAL CLINICAL IMPRESSION(S) / ED DIAGNOSES   Final diagnoses:  Nausea  Generalized abdominal pain     Rx / DC Orders   ED Discharge Orders           Ordered    ondansetron (ZOFRAN-ODT) 4 MG disintegrating tablet  Every 8 hours PRN        07/04/23 2135             Note:  This document was prepared using Dragon voice recognition software and may include unintentional dictation errors.    Faythe Ghee, PA-C 07/04/23 2139    Dionne Bucy, MD 07/04/23 [phone]

## 2023-07-04 NOTE — ED Triage Notes (Signed)
Patient to ED via POV for abd pain. States it started 1.5 days ago with nausea.

## 2023-07-04 NOTE — ED Notes (Signed)
 Pt denied being able to produce a urine sample at this time. Pt provided with a labeled specimen cup and instructions to return cup to triage nurse desk once it has a clean catch urine sample.

## 2023-07-04 NOTE — ED Notes (Signed)
 Provided pt with discharge instructions and education. All of pt questions answered. Pt in possession of all belongings. Pt AAOX4 and stable at time of discharge.Pt ambulated w/ steady gait towards ED exit. Pt accompanied by family member.

## 2023-09-04 ENCOUNTER — Other Ambulatory Visit: Payer: Self-pay

## 2023-09-04 ENCOUNTER — Emergency Department
Admission: EM | Admit: 2023-09-04 | Discharge: 2023-09-04 | Disposition: A | Discharge status: Home / Self Care | Payer: MEDICAID | Attending: Emergency Medicine | Admitting: Emergency Medicine

## 2023-09-04 DIAGNOSIS — Z1152 Encounter for screening for COVID-19: Secondary | ICD-10-CM | POA: Diagnosis not present

## 2023-09-04 DIAGNOSIS — J0100 Acute maxillary sinusitis, unspecified: Secondary | ICD-10-CM | POA: Insufficient documentation

## 2023-09-04 DIAGNOSIS — R0981 Nasal congestion: Secondary | ICD-10-CM | POA: Diagnosis present

## 2023-09-04 LAB — RESP PANEL BY RT-PCR (RSV, FLU A&B, COVID)  RVPGX2
Influenza A by PCR: NEGATIVE
Influenza B by PCR: NEGATIVE
Resp Syncytial Virus by PCR: NEGATIVE
SARS Coronavirus 2 by RT PCR: NEGATIVE

## 2023-09-04 MED ORDER — FLUTICASONE PROPIONATE 50 MCG/ACT NA SUSP
1.0000 | Freq: Two times a day (BID) | NASAL | 0 refills | Status: DC
  Filled 2023-09-04: qty 16, fill #0

## 2023-09-04 MED ORDER — AMOXICILLIN-POT CLAVULANATE 875-125 MG PO TABS
1.0000 | ORAL_TABLET | Freq: Once | ORAL | Status: AC
  Administered 2023-09-04: 1 via ORAL
  Filled 2023-09-04: qty 1

## 2023-09-04 MED ORDER — AMOXICILLIN-POT CLAVULANATE 875-125 MG PO TABS
1.0000 | ORAL_TABLET | Freq: Two times a day (BID) | ORAL | 0 refills | Status: AC
  Filled 2023-09-04: qty 14, 7d supply, fill #0

## 2023-09-04 MED ORDER — CETIRIZINE HCL 10 MG PO TABS
10.0000 mg | ORAL_TABLET | Freq: Every day | ORAL | 0 refills | Status: DC
  Filled 2023-09-04: qty 30, 30d supply, fill #0

## 2023-09-04 MED ORDER — HYDROCODONE-ACETAMINOPHEN 5-325 MG PO TABS
1.0000 | ORAL_TABLET | Freq: Once | ORAL | Status: AC
  Administered 2023-09-04: 1 via ORAL
  Filled 2023-09-04: qty 1

## 2023-09-04 NOTE — ED Triage Notes (Signed)
Pt sts that she has been taking OTC expectorants and decongestants and nothing is helping. Pt sts that she has been coughing up green mucous and also blowing it out of her nose.

## 2023-09-04 NOTE — ED Provider Notes (Signed)
[facility] Provider Note  Patient Contact: 8:19 PM (approximate)   History   Nasal Congestion   HPI  Autumn [name] is a 47 y.o. female who presents to the emergency department with sinus congestion, sinus pressure, postnasal drip and cough.  Patient with a week of symptoms.  She denies fevers, chills, sore throat.  No GI symptoms.     Physical Exam   Triage Vital Signs: ED Triage Vitals  Encounter Vitals Group     BP 09/04/23 1840 122/77     Systolic BP Percentile --      Diastolic BP Percentile --      Pulse Rate 09/04/23 1840 76     Resp 09/04/23 1840 18     Temp 09/04/23 1840 97.7 F (36.5 C)     Temp Source 09/04/23 1840 Oral     SpO2 09/04/23 1840 98 %     Weight 09/04/23 1841 100 lb (45.4 kg)     Height 09/04/23 1841 5\' 4"  (1.626 m)     Head Circumference --      Peak Flow --      Pain Score 09/04/23 1841 6     Pain Loc --      Pain Education --      Exclude from Growth Chart --     Most recent vital signs: Vitals:   09/04/23 1840  BP: 122/77  Pulse: 76  Resp: 18  Temp: 97.7 F (36.5 C)  SpO2: 98%     General: Alert and in no acute distress. ENT:      Ears:       Nose: Moderate purulent congestion/rhinnorhea.  Tender to percussion over the maxillary sinuses.      Mouth/Throat: Mucous membranes are moist. Neck: No stridor. No cervical spine tenderness to palpation.  Cardiovascular:  Good peripheral perfusion Respiratory: Normal respiratory effort without tachypnea or retractions. Lungs CTAB. Good air entry to the bases with no decreased or absent breath sounds. Musculoskeletal: Full range of motion to all extremities.  Neurologic:  No gross focal neurologic deficits are appreciated.  Skin:   No rash noted Other:   ED Results / Procedures / Treatments   Labs (all labs ordered are listed, but only abnormal results are displayed) Labs Reviewed  RESP PANEL BY RT-PCR (RSV, FLU A&B, COVID)  [id number]      EKG     RADIOLOGY    No results found.  PROCEDURES:  Critical Care performed: No  Procedures   MEDICATIONS ORDERED IN ED: Medications  HYDROcodone-acetaminophen (NORCO/VICODIN) 5-325 MG per tablet 1 tablet (has no administration in time range)  amoxicillin-clavulanate (AUGMENTIN) 875-125 MG per tablet 1 tablet (has no administration in time range)     IMPRESSION / MDM / ASSESSMENT AND PLAN / ED COURSE  I reviewed the triage vital signs and the nursing notes.                                 Differential diagnosis includes, but is not limited to, sinusitis, allergic rhinitis, flu, COVID, viral illness   Patient's presentation is most consistent with acute presentation with potential threat to life or bodily function.   Patient's diagnosis is consistent with sinusitis.  Patient presents to the emergency department complaining of sinus pressure with postnasal drip, cough.  Findings on exam are consistent with acute bacterial rhinosinusitis.  Patient will be started on Augmentin, Flonase and Zyrtec.  Follow-up  primary care as needed..  Patient is given ED precautions to return to the ED for any worsening or new symptoms.     FINAL CLINICAL IMPRESSION(S) / ED DIAGNOSES   Final diagnoses:  Acute maxillary sinusitis, recurrence not specified     Rx / DC Orders   ED Discharge Orders          Ordered    amoxicillin-clavulanate (AUGMENTIN) 875-125 MG tablet  2 times daily        09/04/23 2020    fluticasone (FLONASE) 50 MCG/ACT nasal spray  2 times daily        09/04/23 2020    cetirizine (ZYRTEC) 10 MG tablet  Daily        09/04/23 2020             Note:  This document was prepared using Dragon voice recognition software and may include unintentional dictation errors.   Lanette Hampshire 09/04/23 2021    Minna Antis, MD 09/04/23 [phone]

## 2023-10-25 ENCOUNTER — Other Ambulatory Visit (HOSPITAL_COMMUNITY): Payer: Self-pay

## 2023-10-26 ENCOUNTER — Emergency Department: Payer: MEDICAID

## 2023-10-26 ENCOUNTER — Other Ambulatory Visit: Payer: Self-pay

## 2023-10-26 ENCOUNTER — Emergency Department
Admission: EM | Admit: 2023-10-26 | Discharge: 2023-10-26 | Disposition: A | Discharge status: Home / Self Care | Payer: MEDICAID | Attending: Emergency Medicine | Admitting: Emergency Medicine

## 2023-10-26 DIAGNOSIS — S0083XA Contusion of other part of head, initial encounter: Secondary | ICD-10-CM | POA: Insufficient documentation

## 2023-10-26 DIAGNOSIS — M25572 Pain in left ankle and joints of left foot: Secondary | ICD-10-CM | POA: Insufficient documentation

## 2023-10-26 DIAGNOSIS — Z720 Tobacco use: Secondary | ICD-10-CM | POA: Insufficient documentation

## 2023-10-26 DIAGNOSIS — S0993XA Unspecified injury of face, initial encounter: Secondary | ICD-10-CM | POA: Diagnosis present

## 2023-10-26 MED ORDER — ACETAMINOPHEN 325 MG PO TABS
650.0000 mg | ORAL_TABLET | Freq: Once | ORAL | Status: AC
  Administered 2023-10-26: 650 mg via ORAL
  Filled 2023-10-26: qty 2
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
## 2023-10-26 NOTE — Discharge Instructions (Addendum)
Your CT scans and x-ray are normal.  Please follow-up with your outpatient provider.  Please return for any new, worsening, or change in symptoms or other concerns.  It was a pleasure caring for you today.
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
## 2023-11-22 ENCOUNTER — Ambulatory Visit: Payer: MEDICAID | Admitting: Family Medicine

## 2023-12-30 ENCOUNTER — Other Ambulatory Visit (HOSPITAL_COMMUNITY)
Admission: RE | Admit: 2023-12-30 | Discharge: 2023-12-30 | Disposition: A | Discharge status: Home / Self Care | Payer: MEDICAID | Source: Ambulatory Visit | Attending: Family Medicine | Admitting: Family Medicine

## 2023-12-30 ENCOUNTER — Ambulatory Visit (INDEPENDENT_AMBULATORY_CARE_PROVIDER_SITE_OTHER): Payer: MEDICAID | Admitting: Family Medicine

## 2023-12-30 ENCOUNTER — Encounter: Payer: Self-pay | Admitting: Family Medicine

## 2023-12-30 VITALS — BP 94/71 | HR 72 | Resp 16 | Ht 62.0 in | Wt 126.6 lb

## 2023-12-30 DIAGNOSIS — F332 Major depressive disorder, recurrent severe without psychotic features: Secondary | ICD-10-CM | POA: Diagnosis not present

## 2023-12-30 DIAGNOSIS — N898 Other specified noninflammatory disorders of vagina: Secondary | ICD-10-CM

## 2023-12-30 DIAGNOSIS — Z1231 Encounter for screening mammogram for malignant neoplasm of breast: Secondary | ICD-10-CM

## 2023-12-30 DIAGNOSIS — Z1159 Encounter for screening for other viral diseases: Secondary | ICD-10-CM

## 2023-12-30 DIAGNOSIS — M79644 Pain in right finger(s): Secondary | ICD-10-CM

## 2023-12-30 DIAGNOSIS — K047 Periapical abscess without sinus: Secondary | ICD-10-CM | POA: Insufficient documentation

## 2023-12-30 DIAGNOSIS — F17200 Nicotine dependence, unspecified, uncomplicated: Secondary | ICD-10-CM

## 2023-12-30 DIAGNOSIS — R350 Frequency of micturition: Secondary | ICD-10-CM

## 2023-12-30 DIAGNOSIS — Z975 Presence of (intrauterine) contraceptive device: Secondary | ICD-10-CM | POA: Insufficient documentation

## 2023-12-30 DIAGNOSIS — N3000 Acute cystitis without hematuria: Secondary | ICD-10-CM

## 2023-12-30 DIAGNOSIS — Z1211 Encounter for screening for malignant neoplasm of colon: Secondary | ICD-10-CM

## 2023-12-30 DIAGNOSIS — D649 Anemia, unspecified: Secondary | ICD-10-CM | POA: Diagnosis not present

## 2023-12-30 DIAGNOSIS — K0530 Chronic periodontitis, unspecified: Secondary | ICD-10-CM | POA: Insufficient documentation

## 2023-12-30 DIAGNOSIS — Z130 Encounter for screening for diseases of the blood and blood-forming organs and certain disorders involving the immune mechanism: Secondary | ICD-10-CM

## 2023-12-30 DIAGNOSIS — F3132 Bipolar disorder, current episode depressed, moderate: Secondary | ICD-10-CM

## 2023-12-30 DIAGNOSIS — Z7689 Persons encountering health services in other specified circumstances: Secondary | ICD-10-CM

## 2023-12-30 DIAGNOSIS — N644 Mastodynia: Secondary | ICD-10-CM

## 2023-12-30 DIAGNOSIS — G8929 Other chronic pain: Secondary | ICD-10-CM | POA: Insufficient documentation

## 2023-12-30 DIAGNOSIS — F1911 Other psychoactive substance abuse, in remission: Secondary | ICD-10-CM | POA: Insufficient documentation

## 2023-12-30 DIAGNOSIS — Z7251 High risk heterosexual behavior: Secondary | ICD-10-CM

## 2023-12-30 DIAGNOSIS — Z13220 Encounter for screening for lipoid disorders: Secondary | ICD-10-CM

## 2023-12-30 DIAGNOSIS — M5442 Lumbago with sciatica, left side: Secondary | ICD-10-CM

## 2023-12-30 MED ORDER — CHLORHEXIDINE GLUCONATE 0.12 % MT SOLN
15.0000 mL | Freq: Two times a day (BID) | OROMUCOSAL | 0 refills | Status: DC
Start: 2023-12-30 — End: 2024-09-21

## 2023-12-30 MED ORDER — AMOXICILLIN-POT CLAVULANATE 875-125 MG PO TABS: 1.0000 | ORAL_TABLET | Freq: Two times a day (BID) | ORAL | 0 refills | Status: DC

## 2023-12-30 NOTE — Assessment & Plan Note (Signed)
Chronic low back pain with sciatica radiating down the left buttock. Noted nerve damage. Discussed referral to pain management and Medicaid ride services for transportation.   - Send referral to pain management   - Discuss Medicaid ride service for transportation
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
## 2023-12-30 NOTE — Assessment & Plan Note (Addendum)
Requested referral to psychiatry for alcohol cessation and mental health management. Discussed the importance of addressing alcohol use and mental health concurrently.   - Send referral to psychiatry   - Continue Abilify injection, Lamictal, Depakote, Lexapro Defer to specialist management
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
## 2023-12-30 NOTE — Assessment & Plan Note (Signed)
Requested referral to psychiatry for alcohol cessation and mental health management. Discussed the importance of addressing alcohol use and mental health concurrently.   - Send referral to psychiatry

## 2023-12-30 NOTE — Assessment & Plan Note (Signed)
Patient endorses sexual activity with partner who has engaged in intercourse with known-HCV positive person(s) and would like to be tested for sexually transmitted infections. She also endorses vaginal discharge with a bad odor, possibly a yeast infection. Discussed the importance of testing for sexually transmitted infections to determine the cause.   - Provide self-swab for testing   - Test for sexually transmitted infections

## 2023-12-30 NOTE — Patient Instructions (Addendum)
Calcium 1200 mg daily, vitamin D 2000 units daily, and vitamin K 40 mcg daily.

## 2023-12-30 NOTE — Assessment & Plan Note (Signed)
Noted. Has attempted smoking cessation. Not interested today. Recheck on next visit.

## 2023-12-30 NOTE — Assessment & Plan Note (Signed)
Inquired about calcium supplementation due to age and arthritis. No recent colonoscopy. Discussed the importance of calcium and vitamin D supplementation for bone health and options for colon cancer screening, including Cologuard as a non-invasive alternative to colonoscopy.   - Recommend calcium 1200 mg per day with 2000 units of vitamin D   - Order Cologuard test for colon cancer screening

## 2023-12-30 NOTE — Assessment & Plan Note (Signed)
Last use a couple months ago (fentanyl for pain).

## 2023-12-31 LAB — COMPREHENSIVE METABOLIC PANEL
ALT: 6 [IU]/L (ref 0–32)
AST: 8 [IU]/L (ref 0–40)
Albumin: 4.9 g/dL (ref 3.9–4.9)
Alkaline Phosphatase: 69 [IU]/L (ref 44–121)
BUN/Creatinine Ratio: 13 (ref 9–23)
BUN: 9 mg/dL (ref 6–24)
Bilirubin Total: 0.5 mg/dL (ref 0.0–1.2)
CO2: 20 mmol/L (ref 20–29)
Calcium: 9.5 mg/dL (ref 8.7–10.2)
Chloride: 101 mmol/L (ref 96–106)
Creatinine, Ser: 0.67 mg/dL (ref 0.57–1.00)
Globulin, Total: 2.1 g/dL (ref 1.5–4.5)
Glucose: 96 mg/dL (ref 70–99)
Potassium: 4 mmol/L (ref 3.5–5.2)
Sodium: 138 mmol/L (ref 134–144)
Total Protein: 7 g/dL (ref 6.0–8.5)
eGFR: 109 mL/min/{1.73_m2} (ref >59)

## 2023-12-31 LAB — LIPID PANEL
Chol/HDL Ratio: 2.7 {ratio} (ref 0.0–4.4)
Cholesterol, Total: 179 mg/dL (ref 100–199)
HDL: 67 mg/dL (ref >39)
LDL Chol Calc (NIH): 102 mg/dL — ABNORMAL HIGH (ref 0–99)
Triglycerides: 53 mg/dL (ref 0–149)
VLDL Cholesterol Cal: 10 mg/dL (ref 5–40)

## 2023-12-31 LAB — HEPATITIS B SURFACE ANTIGEN: Hepatitis B Surface Ag: NEGATIVE

## 2023-12-31 LAB — CBC
Hematocrit: 42.8 % (ref 34.0–46.6)
Hemoglobin: 14.6 g/dL (ref 11.1–15.9)
MCH: 33 pg (ref 26.6–33.0)
MCHC: 34.1 g/dL (ref 31.5–35.7)
MCV: 97 fL (ref 79–97)
Platelets: 274 10*3/uL (ref 150–450)
RBC: 4.43 x10E6/uL (ref 3.77–5.28)
RDW: 11.8 % (ref 11.7–15.4)
WBC: 8.4 10*3/uL (ref 3.4–10.8)

## 2023-12-31 LAB — HIV ANTIBODY (ROUTINE TESTING W REFLEX): HIV Screen 4th Generation wRfx: NONREACTIVE

## 2023-12-31 LAB — HCG, SERUM, QUALITATIVE: hCG,Beta Subunit,Qual,Serum: NEGATIVE m[IU]/mL (ref <6)

## 2023-12-31 LAB — HEPATITIS B CORE AB W/REFLEX: Hep B Core Total Ab: NEGATIVE

## 2023-12-31 LAB — HCV INTERPRETATION

## 2023-12-31 LAB — VITAMIN B12: Vitamin B-12: 509 pg/mL (ref 232–1245)

## 2023-12-31 LAB — HEPATITIS A ANTIBODY, TOTAL: hep A Total Ab: NEGATIVE

## 2023-12-31 LAB — HCV AB W REFLEX TO QUANT PCR: HCV Ab: NONREACTIVE

## 2024-01-01 ENCOUNTER — Encounter: Payer: Self-pay | Admitting: Family Medicine

## 2024-01-01 MED ORDER — NITROFURANTOIN MONOHYD MACRO 100 MG PO CAPS: 100.0000 mg | ORAL_CAPSULE | Freq: Two times a day (BID) | ORAL | 0 refills | Status: DC

## 2024-01-01 NOTE — Assessment & Plan Note (Addendum)
 Patient reports absent period for two months and is concerned about effectiveness. Pregnancy test ordered.

## 2024-01-02 LAB — CERVICOVAGINAL ANCILLARY ONLY
Bacterial Vaginitis (gardnerella): NEGATIVE
Candida Glabrata: NEGATIVE
Candida Vaginitis: NEGATIVE
Chlamydia: NEGATIVE
Comment: NEGATIVE
Comment: NEGATIVE
Comment: NEGATIVE
Comment: NEGATIVE
Comment: NEGATIVE
Comment: NORMAL
Neisseria Gonorrhea: NEGATIVE
Trichomonas: NEGATIVE

## 2024-01-02 LAB — MICROSCOPIC EXAMINATION
Casts: NONE SEEN /LPF
Epithelial Cells (non renal): >10 /[HPF] — AB (ref 0–10)
WBC, UA: NONE SEEN /HPF (ref 0–5)

## 2024-01-02 LAB — UA/M W/RFLX CULTURE, ROUTINE
Glucose, UA: NEGATIVE
Nitrite, UA: NEGATIVE
RBC, UA: NEGATIVE
Specific Gravity, UA: >=1.03 — AB (ref 1.005–1.030)
Urobilinogen, Ur: 1 mg/dL (ref 0.2–1.0)
pH, UA: 5.5 (ref 5.0–7.5)

## 2024-01-02 LAB — URINE CULTURE, REFLEX

## 2024-01-02 LAB — SPECIMEN STATUS REPORT

## 2024-01-10 ENCOUNTER — Ambulatory Visit: Payer: MEDICAID | Admitting: Family Medicine

## 2024-01-27 ENCOUNTER — Encounter: Payer: MEDICAID | Admitting: Family Medicine

## 2024-01-31 ENCOUNTER — Other Ambulatory Visit: Payer: Self-pay

## 2024-01-31 ENCOUNTER — Emergency Department
Admission: EM | Admit: 2024-01-31 | Discharge: 2024-01-31 | Discharge status: Against Medical Advice | Payer: MEDICAID | Attending: Emergency Medicine | Admitting: Emergency Medicine

## 2024-01-31 DIAGNOSIS — R300 Dysuria: Secondary | ICD-10-CM | POA: Diagnosis not present

## 2024-01-31 DIAGNOSIS — S7011XA Contusion of right thigh, initial encounter: Secondary | ICD-10-CM | POA: Insufficient documentation

## 2024-01-31 DIAGNOSIS — S79921A Unspecified injury of right thigh, initial encounter: Secondary | ICD-10-CM | POA: Diagnosis present

## 2024-01-31 DIAGNOSIS — W540XXA Bitten by dog, initial encounter: Secondary | ICD-10-CM | POA: Diagnosis not present

## 2024-01-31 LAB — URINALYSIS, ROUTINE W REFLEX MICROSCOPIC
Bacteria, UA: NONE SEEN
Bilirubin Urine: NEGATIVE
Glucose, UA: NEGATIVE mg/dL
Hgb urine dipstick: NEGATIVE
Ketones, ur: NEGATIVE mg/dL
Nitrite: NEGATIVE
Protein, ur: NEGATIVE mg/dL
Specific Gravity, Urine: 1.021 (ref 1.005–1.030)
pH: 5 (ref 5.0–8.0)

## 2024-01-31 LAB — PREGNANCY, URINE: Preg Test, Ur: NEGATIVE

## 2024-01-31 NOTE — ED Triage Notes (Addendum)
 Pt comes with dark colored urine, pain and discharge for 3 weeks.Pt state she was also bit by Pit bull yesterday to back of right upper leg. Pt states she did not report it and unsure if dog has shots. Pt states it isn't her dog.

## 2024-01-31 NOTE — ED Notes (Signed)
 See triage note  States she attempted to pet a dog  The dog nipped her   Also having some vaginal itching with discharge   States she has a hx of STD and it feels the same

## 2024-01-31 NOTE — ED Provider Notes (Signed)
 [facility] Provider Note    Event Date/Time   First MD Initiated Contact with Patient 01/31/24 1431     (approximate)   History   Animal Bite and Urinary Frequency   HPI  Autumn [name] is a 48 y.o. female with a history of bipolar, seizures, glaucoma, anemia presents emergency department complaining of a dog bite to the back of the right upper leg.  States she has also had dark-colored urine with pain and discharge for about 3 weeks.  The dog bite occurred yesterday.  Unsure if the dog has the shots but does know where the dog lives      Physical Exam   Triage Vital Signs: ED Triage Vitals  Encounter Vitals Group     BP 01/31/24 1338 119/72     Systolic BP Percentile --      Diastolic BP Percentile --      Pulse Rate 01/31/24 1338 96     Resp 01/31/24 1338 18     Temp 01/31/24 1338 98 F (36.7 C)     Temp Source 01/31/24 1338 Oral     SpO2 01/31/24 1338 99 %     Weight --      Height --      Head Circumference --      Peak Flow --      Pain Score 01/31/24 1341 7     Pain Loc --      Pain Education --      Exclude from Growth Chart --     Most recent vital signs: Vitals:   01/31/24 1338  BP: 119/72  Pulse: 96  Resp: 18  Temp: 98 F (36.7 C)  SpO2: 99%     General: Awake, no distress.   CV:  Good peripheral perfusion. regular rate and  rhythm Resp:  Normal effort.  Abd:  No distention.   Other:  Skin with superficial abrasion noted to the posterior right thigh along with some bruising   ED Results / Procedures / Treatments   Labs (all labs ordered are listed, but only abnormal results are displayed) Labs Reviewed  URINALYSIS, ROUTINE W REFLEX MICROSCOPIC - Abnormal; Notable for the following components:      Result Value   Color, Urine YELLOW (*)    APPearance HAZY (*)    Leukocytes,Ua LARGE (*)    All other components within normal limits  CHLAMYDIA/NGC RT PCR ([facility] ONLY)            WET PREP, GENITAL  PREGNANCY,  URINE     EKG     RADIOLOGY     PROCEDURES:   Procedures Chief Complaint  Patient presents with   Animal Bite   Urinary Frequency      MEDICATIONS ORDERED IN ED: Medications - No data to display   IMPRESSION / MDM / ASSESSMENT AND PLAN / ED COURSE  I reviewed the triage vital signs and the nursing notes.                              Differential diagnosis includes, but is not limited to, dog bite, cellulitis, wound infection, UTI, chlamydia, gonorrhea, bacterial vaginosis  Patient's presentation is most consistent with acute illness / injury with system symptoms.    POC pregnancy reassuring, urinalysis has large amount of leuks, no bacteria noted  I did order GC/chlamydia and a wet prep.  Patient eloped from the exam room.  I had talked with her about rabies vaccinations.  She is deferring rabies vaccination today.  This was prior to her eloping     FINAL CLINICAL IMPRESSION(S) / ED DIAGNOSES   Final diagnoses:  Dog bite, initial encounter  Dysuria     Rx / DC Orders   ED Discharge Orders     None        Note:  This document was prepared using Dragon voice recognition software and may include unintentional dictation errors.    Faythe Ghee, PA-C 01/31/24 1801    Trinna Post, MD 02/01/24 [phone]

## 2024-01-31 NOTE — ED Notes (Signed)
 Pt is not in room for testing  This nurse called the patient and she stated that she had an emergency

## 2024-02-01 LAB — CHLAMYDIA/NGC RT PCR (ARMC ONLY)
Chlamydia Tr: NOT DETECTED
N gonorrhoeae: NOT DETECTED

## 2024-02-02 ENCOUNTER — Telehealth: Payer: MEDICAID | Admitting: Physician Assistant

## 2024-02-02 DIAGNOSIS — R102 Pelvic and perineal pain: Secondary | ICD-10-CM

## 2024-02-02 DIAGNOSIS — R300 Dysuria: Secondary | ICD-10-CM

## 2024-02-02 NOTE — Progress Notes (Signed)
  Because of both vaginal and bladder symptoms and need for testing of urine to differentiate between UTI versus bv versus yeast or a combination, I feel your condition warrants further evaluation and I recommend that you be seen in a face-to-face visit.   NOTE: There will be NO CHARGE for this E-Visit   If you are having a true medical emergency, please call 911.     For an urgent face to face visit, [facility] has multiple urgent care centers for your convenience.  Click the link below for the full list of locations and hours, walk-in wait times, appointment scheduling options and driving directions:  Urgent Care - [place], [place], [place], [place], [place], [place]  [place]     Your MyChart E-visit questionnaire answers were reviewed by a board certified advanced clinical practitioner to complete your personal care plan based on your specific symptoms.    Thank you for using e-Visits.

## 2024-02-29 ENCOUNTER — Ambulatory Visit: Payer: MEDICAID | Admitting: Pain Medicine

## 2024-03-05 ENCOUNTER — Ambulatory Visit: Payer: MEDICAID | Admitting: Family Medicine

## 2024-03-06 DIAGNOSIS — F1511 Other stimulant abuse, in remission: Secondary | ICD-10-CM | POA: Insufficient documentation

## 2024-03-06 DIAGNOSIS — M899 Disorder of bone, unspecified: Secondary | ICD-10-CM | POA: Insufficient documentation

## 2024-03-06 DIAGNOSIS — G894 Chronic pain syndrome: Secondary | ICD-10-CM | POA: Insufficient documentation

## 2024-03-06 DIAGNOSIS — R892 Abnormal level of other drugs, medicaments and biological substances in specimens from other organs, systems and tissues: Secondary | ICD-10-CM | POA: Insufficient documentation

## 2024-03-06 DIAGNOSIS — F1111 Opioid abuse, in remission: Secondary | ICD-10-CM | POA: Insufficient documentation

## 2024-03-06 DIAGNOSIS — Z789 Other specified health status: Secondary | ICD-10-CM | POA: Insufficient documentation

## 2024-03-06 DIAGNOSIS — F1011 Alcohol abuse, in remission: Secondary | ICD-10-CM | POA: Insufficient documentation

## 2024-03-06 DIAGNOSIS — F1411 Cocaine abuse, in remission: Secondary | ICD-10-CM | POA: Insufficient documentation

## 2024-03-06 DIAGNOSIS — Z79899 Other long term (current) drug therapy: Secondary | ICD-10-CM | POA: Insufficient documentation

## 2024-03-06 NOTE — Progress Notes (Unsigned)
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  (1.575 m).   Weight as of 12/30/23: 126 lb 9.6 oz (57.4 kg).  BMI interpretation table: BMI level Category Range association with higher incidence of chronic pain  <18 kg/m2 Underweight   18.5-24.9 kg/m2 Ideal body weight   25-29.9 kg/m2 Overweight Increased incidence by 20%  30-34.9 kg/m2 Obese (Class I) Increased incidence by 68%  35-39.9 kg/m2 Severe obesity (Class II) Increased incidence by 136%  >40 kg/m2 Extreme obesity (Class III) Increased incidence by 254%   Patient's current BMI Ideal Body weight  There is no height or weight on file to calculate BMI. Patient weight not recorded   BMI Readings from Last 4 Encounters:  12/30/23 23.16 kg/m  10/26/23 18.29 kg/m  09/04/23 17.16 kg/m  07/04/23 19.20 kg/m   Wt Readings from Last 4 Encounters:  12/30/23 126 lb 9.6 oz (57.4  kg)  10/26/23 100 lb (45.4 kg)  09/04/23 100 lb (45.4 kg)  07/04/23 105 lb (47.6 kg)    Psych/Mental status: Alert, oriented x 3 (person, place, & time)       Eyes: PERLA Respiratory: No evidence of acute respiratory distress  Assessment  Primary Diagnosis & Pertinent Problem List: The primary encounter  diagnosis was Chronic pain syndrome. Diagnoses of Pharmacologic therapy, Disorder of skeletal system, Problems influencing health status, History of alcohol abuse, History of cocaine abuse (HCC), History of methamphetamine abuse (HCC), History of substance abuse (HCC), History of heroin abuse (HCC), History of opioid abuse (HCC), and Abnormal drug screen were also pertinent to this visit.  Visit Diagnosis (New problems to examiner): 1. Chronic pain syndrome   2. Pharmacologic therapy   3. Disorder of skeletal system   4. Problems influencing health status   5. History of alcohol abuse   6. History of cocaine abuse (HCC)   7. History of methamphetamine abuse (HCC)   8. History of substance abuse (HCC)   9. History of heroin abuse (HCC)   10. History of opioid abuse (HCC)   11. Abnormal drug screen    Plan of Care (Initial workup plan)  Note: Ms. Riano was reminded that as per protocol, today's visit has been an evaluation only. We have not taken over the patient's controlled substance management.  Problem-specific plan: Assessment and Plan            Lab Orders  No laboratory test(s) ordered today   Imaging Orders  No imaging studies ordered today   Referral Orders  No referral(s) requested today   Procedure Orders    No procedure(s) ordered today   Pharmacotherapy (current): Medications ordered:  No orders of the defined types were placed in this encounter.  Medications administered during this visit: Ninamarie Keenon had no medications administered during this visit.   Analgesic Pharmacotherapy:  Opioid Analgesics: For patients currently taking or requesting to  take opioid analgesics, in accordance with [place]  Medical Board Guidelines, we will assess their risks and indications for the use of these substances. After completing our evaluation, we may offer recommendations, but we no longer take patients for medication management. The prescribing physician will ultimately decide, based on his/her training and level of comfort whether to adopt any of the recommendations, including whether or not to prescribe such medicines.  Membrane stabilizer: To be determined at a later time  Muscle relaxant: To be determined at a later time  NSAID: To be determined at a later time  Other analgesic(s): To be determined at a later time   Interventional management options: Ms. Gemmel was informed that there is no guarantee that she would be a candidate for interventional therapies. The decision will be based on the results of diagnostic studies, as well as Ms. Corbello's risk profile.  Procedure(s) under consideration:  Pending results of ordered studies     Interventional Therapies  Risk Factors  Considerations  Medical Comorbidities:     Planned  Pending:      Under consideration:   Pending   Completed:   None at this time   Therapeutic  Palliative (PRN) options:   None established   Completed by other providers:   None reported     Provider-requested follow-up: No follow-ups on file.  Future Appointments  Date Time Provider Department Center  03/07/2024 11:00 AM Renaldo Caroli, MD [facility] None   I discussed the assessment and treatment plan with the patient. The patient was provided an opportunity to ask questions and all were answered. The patient agreed with the plan and demonstrated an understanding of the instructions.  Patient advised to call back or seek an in-person evaluation if the symptoms or condition worsens.  Duration of encounter: *** minutes.  Total time on encounter, as per AMA guidelines included both the  face-to-face and non-face-to-face time personally spent by the physician and/or other qualified health care professional(s) on the day of the encounter (includes time in activities that require the physician or other qualified health care professional and does not include time in activities normally performed by clinical staff). Physician's time may include the following activities when performed: Preparing to see the patient (e.g., pre-charting review of records, searching for previously ordered imaging, lab work, and nerve conduction tests) Review of prior analgesic pharmacotherapies. Reviewing PMP Interpreting ordered tests (e.g., lab work, imaging, nerve conduction tests) Performing post-procedure evaluations, including interpretation of diagnostic procedures Obtaining and/or reviewing separately obtained history Performing a medically appropriate examination and/or evaluation Counseling and educating the patient/family/caregiver Ordering medications, tests, or procedures Referring and communicating with other health care professionals (when not separately reported) Documenting clinical information in the electronic or other health record Independently interpreting results (not separately reported) and communicating results to the patient/ family/caregiver Care coordination (not separately reported)  Note by: Candi Chafe, MD (TTS and AI technology used. I apologize for any typographical errors that were not detected and corrected.) Date: 03/07/2024; Time: 12:33 PM

## 2024-03-06 NOTE — Patient Instructions (Signed)

## 2024-03-07 ENCOUNTER — Ambulatory Visit (HOSPITAL_BASED_OUTPATIENT_CLINIC_OR_DEPARTMENT_OTHER): Payer: MEDICAID | Admitting: Pain Medicine

## 2024-03-07 DIAGNOSIS — F1911 Other psychoactive substance abuse, in remission: Secondary | ICD-10-CM

## 2024-03-07 DIAGNOSIS — Z789 Other specified health status: Secondary | ICD-10-CM

## 2024-03-07 DIAGNOSIS — Z79899 Other long term (current) drug therapy: Secondary | ICD-10-CM

## 2024-03-07 DIAGNOSIS — F1111 Opioid abuse, in remission: Secondary | ICD-10-CM

## 2024-03-07 DIAGNOSIS — F1511 Other stimulant abuse, in remission: Secondary | ICD-10-CM

## 2024-03-07 DIAGNOSIS — F1411 Cocaine abuse, in remission: Secondary | ICD-10-CM

## 2024-03-07 DIAGNOSIS — M899 Disorder of bone, unspecified: Secondary | ICD-10-CM

## 2024-03-07 DIAGNOSIS — Z91199 Patient's noncompliance with other medical treatment and regimen due to unspecified reason: Secondary | ICD-10-CM

## 2024-03-07 DIAGNOSIS — G894 Chronic pain syndrome: Secondary | ICD-10-CM

## 2024-03-07 DIAGNOSIS — F1011 Alcohol abuse, in remission: Secondary | ICD-10-CM

## 2024-03-07 DIAGNOSIS — G8929 Other chronic pain: Secondary | ICD-10-CM

## 2024-03-07 DIAGNOSIS — R892 Abnormal level of other drugs, medicaments and biological substances in specimens from other organs, systems and tissues: Secondary | ICD-10-CM

## 2024-04-21 ENCOUNTER — Emergency Department
Admission: EM | Admit: 2024-04-21 | Discharge: 2024-04-21 | Disposition: A | Discharge status: Home / Self Care | Payer: MEDICAID | Attending: Emergency Medicine | Admitting: Emergency Medicine

## 2024-04-21 ENCOUNTER — Other Ambulatory Visit: Payer: Self-pay

## 2024-04-21 ENCOUNTER — Emergency Department: Payer: MEDICAID

## 2024-04-21 DIAGNOSIS — R1030 Lower abdominal pain, unspecified: Secondary | ICD-10-CM

## 2024-04-21 DIAGNOSIS — R1031 Right lower quadrant pain: Secondary | ICD-10-CM | POA: Diagnosis present

## 2024-04-21 DIAGNOSIS — F1010 Alcohol abuse, uncomplicated: Secondary | ICD-10-CM | POA: Diagnosis not present

## 2024-04-21 LAB — CBC
HCT: 38.7 % (ref 36.0–46.0)
Hemoglobin: 13.2 g/dL (ref 12.0–15.0)
MCH: 31.7 pg (ref 26.0–34.0)
MCHC: 34.1 g/dL (ref 30.0–36.0)
MCV: 92.8 fL (ref 80.0–100.0)
Platelets: 308 10*3/uL (ref 150–400)
RBC: 4.17 MIL/uL (ref 3.87–5.11)
RDW: 12.5 % (ref 11.5–15.5)
WBC: 9.8 10*3/uL (ref 4.0–10.5)
nRBC: 0 % (ref 0.0–0.2)

## 2024-04-21 LAB — URINALYSIS, ROUTINE W REFLEX MICROSCOPIC
Bilirubin Urine: NEGATIVE
Glucose, UA: NEGATIVE mg/dL
Ketones, ur: NEGATIVE mg/dL
Leukocytes,Ua: NEGATIVE
Nitrite: NEGATIVE
Protein, ur: NEGATIVE mg/dL
Specific Gravity, Urine: 1.024 (ref 1.005–1.030)
pH: 5 (ref 5.0–8.0)

## 2024-04-21 LAB — WET PREP, GENITAL
Clue Cells Wet Prep HPF POC: NONE SEEN
Sperm: NONE SEEN
Trich, Wet Prep: NONE SEEN
WBC, Wet Prep HPF POC: <10 (ref <10)
Yeast Wet Prep HPF POC: NONE SEEN

## 2024-04-21 LAB — LIPASE, BLOOD: Lipase: 34 U/L (ref 11–51)

## 2024-04-21 LAB — COMPREHENSIVE METABOLIC PANEL WITH GFR
ALT: 14 U/L (ref 0–44)
AST: 8 U/L — ABNORMAL LOW (ref 15–41)
Albumin: 4 g/dL (ref 3.5–5.0)
Alkaline Phosphatase: 67 U/L (ref 38–126)
Anion gap: 9 (ref 5–15)
BUN: 13 mg/dL (ref 6–20)
CO2: 23 mmol/L (ref 22–32)
Calcium: 9 mg/dL (ref 8.9–10.3)
Chloride: 107 mmol/L (ref 98–111)
Creatinine, Ser: 0.65 mg/dL (ref 0.44–1.00)
GFR, Estimated: >60 mL/min (ref >60)
Glucose, Bld: 98 mg/dL (ref 70–99)
Potassium: 3.5 mmol/L (ref 3.5–5.1)
Sodium: 139 mmol/L (ref 135–145)
Total Bilirubin: 0.6 mg/dL (ref 0.0–1.2)
Total Protein: 7.1 g/dL (ref 6.5–8.1)

## 2024-04-21 LAB — CHLAMYDIA/NGC RT PCR (ARMC ONLY)
Chlamydia Tr: NOT DETECTED
N gonorrhoeae: NOT DETECTED

## 2024-04-21 LAB — ETHANOL: Alcohol, Ethyl (B): 33 mg/dL — ABNORMAL HIGH (ref <15)

## 2024-04-21 MED ORDER — SODIUM CHLORIDE 0.9 % IV BOLUS
1000.0000 mL | Freq: Once | INTRAVENOUS | Status: AC
  Administered 2024-04-21: 1000 mL via INTRAVENOUS

## 2024-04-21 MED ORDER — MORPHINE SULFATE (PF) 4 MG/ML IV SOLN
4.0000 mg | Freq: Once | INTRAVENOUS | Status: AC
  Administered 2024-04-21: 4 mg via INTRAVENOUS
  Filled 2024-04-21: qty 1

## 2024-04-21 MED ORDER — ONDANSETRON HCL 4 MG/2ML IJ SOLN
4.0000 mg | Freq: Once | INTRAMUSCULAR | Status: AC
  Administered 2024-04-21: 4 mg via INTRAVENOUS
  Filled 2024-04-21: qty 2

## 2024-04-21 MED ORDER — IOHEXOL 300 MG/ML  SOLN
100.0000 mL | Freq: Once | INTRAMUSCULAR | Status: AC | PRN
  Administered 2024-04-21: 100 mL via INTRAVENOUS

## 2024-04-21 NOTE — ED Notes (Signed)
Patient Pregnancy test negative

## 2024-04-21 NOTE — ED Provider Notes (Signed)
 [facility] Provider Note    Event Date/Time   First MD Initiated Contact with Patient 04/21/24 2133     (approximate)   History   Abdominal Pain   HPI  Autumn [name] is a 48 y.o. female history of glaucoma, bipolar, seizures, drug abuse presents emergency department with right lower quadrant pain and vaginal discharge.  Patient states she does still have an appendix and gallbladder.  Sudden onset about 2 hours ago.  Some nausea.  No vomiting.  No diarrhea.      Physical Exam   Triage Vital Signs: ED Triage Vitals  Encounter Vitals Group     BP 04/21/24 2026 101/65     Girls Systolic BP Percentile --      Girls Diastolic BP Percentile --      Boys Systolic BP Percentile --      Boys Diastolic BP Percentile --      Pulse Rate 04/21/24 2026 84     Resp 04/21/24 2026 16     Temp 04/21/24 2026 98.5 F (36.9 C)     Temp Source 04/21/24 2026 Oral     SpO2 04/21/24 2026 97 %     Weight 04/21/24 2027 115 lb (52.2 kg)     Height 04/21/24 2027 5' 2 (1.575 m)     Head Circumference --      Peak Flow --      Pain Score 04/21/24 2026 9     Pain Loc --      Pain Education --      Exclude from Growth Chart --     Most recent vital signs: Vitals:   04/21/24 2026  BP: 101/65  Pulse: 84  Resp: 16  Temp: 98.5 F (36.9 C)  SpO2: 97%     General: Awake, no distress.   CV:  Good peripheral perfusion. regular rate and  rhythm Resp:  Normal effort. Lungs cya Abd:  No distention.  Abdomen tender in the right lower quadrant, bowel sounds normal Other:      ED Results / Procedures / Treatments   Labs (all labs ordered are listed, but only abnormal results are displayed) Labs Reviewed  COMPREHENSIVE METABOLIC PANEL WITH GFR - Abnormal; Notable for the following components:      Result Value   AST 8 (*)    All other components within normal limits  URINALYSIS, ROUTINE W REFLEX MICROSCOPIC - Abnormal; Notable for the following components:    Color, Urine YELLOW (*)    APPearance HAZY (*)    Hgb urine dipstick MODERATE (*)    Bacteria, UA RARE (*)    All other components within normal limits  ETHANOL - Abnormal; Notable for the following components:   Alcohol, Ethyl (B) 33 (*)    All other components within normal limits  WET PREP, GENITAL  CHLAMYDIA/NGC RT PCR ([facility] ONLY)            LIPASE, BLOOD  CBC  POC URINE PREG, ED     EKG     RADIOLOGY CT abdomen pelvis IV contrast    PROCEDURES:   Procedures  Critical Care:  no Chief Complaint  Patient presents with   Abdominal Pain      MEDICATIONS ORDERED IN ED: Medications  sodium chloride  0.9 % bolus 1,000 mL (1,000 mLs Intravenous New Bag/Given 04/21/24 2209)  morphine  (PF) 4 MG/ML injection 4 mg (4 mg Intravenous Given 04/21/24 2210)  ondansetron  (ZOFRAN ) injection 4 mg (4 mg Intravenous Given  04/21/24 2210)  iohexol  (OMNIPAQUE ) 300 MG/ML solution 100 mL (100 mLs Intravenous Contrast Given 04/21/24 2317)     IMPRESSION / MDM / ASSESSMENT AND PLAN / ED COURSE  I reviewed the triage vital signs and the nursing notes.                              Differential diagnosis includes, but is not limited to, acute appendicitis, PID, ovarian cyst, bowel obstruction, acute cholecystitis, kidney stone  Patient's presentation is most consistent with acute illness / injury with system symptoms.    Medications given, normal saline 1 L IV, morphine  4 mg IV, Zofran  4 mg IV  Labs and imaging ordered  Labs are reassuring  Did add a gonorrhea's/chlamydia, wet prep as patient states she does have vaginal discharge  CT abdomen pelvis IV contrast due to the right lower quadrant pain   CT abdomen pelvis IV contrast independently reviewed interpreted by me as being negative for any acute abnormality  I did explain these findings to patient.  She is to follow-up with her regular doctor if not improving in 3 days.  Return to the emergency department if worsening.   Patient's white count is not elevated, there is no inflammation noted around the area of the appendix even though does not visualize so I feel that appendicitis is less likely at this time.  However patient was given strict instructions to return if increasing right lower quadrant pain.  She states she understands.  Discharged stable condition.   FINAL CLINICAL IMPRESSION(S) / ED DIAGNOSES   Final diagnoses:  Lower abdominal pain  ETOH abuse     Rx / DC Orders   ED Discharge Orders     None        Note:  This document was prepared using Dragon voice recognition software and may include unintentional dictation errors.    Delsie Figures, PA-C 04/21/24 Gladystine Lamprey    Ruth Cove, MD 04/24/24 [phone]

## 2024-04-21 NOTE — ED Triage Notes (Signed)
 Pt to ED via [facility] with c/o RLQ abd pain that started earlier this evening (approx 1hr) prior to calling EMS, per EMS pt stated pain started while she was drinking vodka and watching a movie. Per EMS pt denies vomiting and diarrhea but does c/o nausea. Pt A&O and ambulatory on arrival to ED.   104/69 97% RA 94HR CBG 105

## 2024-06-30 ENCOUNTER — Other Ambulatory Visit: Payer: Self-pay

## 2024-06-30 ENCOUNTER — Emergency Department
Admission: EM | Admit: 2024-06-30 | Discharge: 2024-06-30 | Disposition: A | Discharge status: Home / Self Care | Payer: MEDICAID | Attending: Emergency Medicine | Admitting: Emergency Medicine

## 2024-06-30 DIAGNOSIS — X58XXXA Exposure to other specified factors, initial encounter: Secondary | ICD-10-CM | POA: Insufficient documentation

## 2024-06-30 DIAGNOSIS — K047 Periapical abscess without sinus: Secondary | ICD-10-CM | POA: Insufficient documentation

## 2024-06-30 DIAGNOSIS — S91115A Laceration without foreign body of left lesser toe(s) without damage to nail, initial encounter: Secondary | ICD-10-CM | POA: Diagnosis not present

## 2024-06-30 DIAGNOSIS — S99922A Unspecified injury of left foot, initial encounter: Secondary | ICD-10-CM | POA: Diagnosis present

## 2024-06-30 MED ORDER — CLINDAMYCIN HCL 150 MG PO CAPS
300.0000 mg | ORAL_CAPSULE | Freq: Once | ORAL | Status: AC
  Administered 2024-06-30: 300 mg via ORAL
  Filled 2024-06-30: qty 2

## 2024-06-30 MED ORDER — CLINDAMYCIN HCL 300 MG PO CAPS
300.0000 mg | ORAL_CAPSULE | Freq: Three times a day (TID) | ORAL | 0 refills | Status: AC
Start: 2024-06-30 — End: 2024-07-05

## 2024-06-30 MED ORDER — HYDROCODONE-ACETAMINOPHEN 5-325 MG PO TABS
1.0000 | ORAL_TABLET | Freq: Once | ORAL | Status: AC
  Administered 2024-06-30: 1 via ORAL
  Filled 2024-06-30: qty 1

## 2024-06-30 NOTE — ED Provider Notes (Signed)
 [facility] Provider Note    Event Date/Time   First MD Initiated Contact with Patient 06/30/24 [phone]     (approximate)   History   Dental Problem   HPI  Autumn [name] is a 49 year old female presenting to the emergency department for evaluation of dental pain.  For the past few days she has had worsening pain over her left lower molars.  Feels that the area is swollen is concerned that there may be an abscess there.  Has not seen a dentist for this.  No fevers.  Tolerating p.o. intake.  Additionally reports that while walking over here she one of her toes with the adjacent toenail, requesting that this be before she is discharged.     Physical Exam   Triage Vital Signs: ED Triage Vitals  Encounter Vitals Group     BP 06/30/24 0210 107/86     Girls Systolic BP Percentile --      Girls Diastolic BP Percentile --      Boys Systolic BP Percentile --      Boys Diastolic BP Percentile --      Pulse Rate 06/30/24 0210 85     Resp 06/30/24 0210 18     Temp 06/30/24 0210 98.4 F (36.9 C)     Temp Source 06/30/24 0210 Oral     SpO2 06/30/24 0210 100 %     Weight 06/30/24 0208 120 lb (54.4 kg)     Height 06/30/24 0208 5' 2 (1.575 m)     Head Circumference --      Peak Flow --      Pain Score 06/30/24 0208 8     Pain Loc --      Pain Education --      Exclude from Growth Chart --     Most recent vital signs: Vitals:   06/30/24 0210  BP: 107/86  Pulse: 85  Resp: 18  Temp: 98.4 F (36.9 C)  SpO2: 100%     General: Awake, interactive  HEENT: Poor dentition.  Abscessed tooth over the left lower molars with associated touch tenderness to palpation.  There is some swelling of the gums, but no visible periapical abscess, no facial swelling, managing secretions without issue CV:  Regular rate, good peripheral perfusion.  Resp:  Unlabored respirations.  Abd:  Nondistended.  Neuro:  Symmetric facial movement, fluid speech MSK:  There is a small  superficial laceration without associated gaping along the lateral aspect of the left second toe that appears to be related to a long toenail on the third toe   ED Results / Procedures / Treatments   Labs (all labs ordered are listed, but only abnormal results are displayed) Labs Reviewed - No data to display   EKG EKG independently reviewed and interpreted by myself demonstrates:    RADIOLOGY Imaging independently reviewed and interpreted by myself demonstrates:   Formal Radiology Read:  No results found.  PROCEDURES:  Critical Care performed: No  Procedures   MEDICATIONS ORDERED IN ED: Medications  HYDROcodone -acetaminophen  (NORCO/VICODIN) 5-325 MG per tablet 1 tablet (1 tablet Oral Given 06/30/24 0331)  clindamycin  (CLEOCIN ) capsule 300 mg (300 mg Oral Given 06/30/24 0331)     IMPRESSION / MDM / ASSESSMENT AND PLAN / ED COURSE  I reviewed the triage vital signs and the nursing notes.  Differential diagnosis includes, but is not limited to, dental infection, abscessed tooth, no evidence of Ludwig angina, facial abscess, periapical abscess  Patient's presentation is most  consistent with acute, uncomplicated illness.  48 year old female presenting to the emergency department for evaluation of dental pain.  Exam consistent with dental infection without visible drainable abscess.  Given a dose of pain medication here and first dose of antibiotic, will DC with prescription for clindamycin .  Emphasized importance of follow-up with a dentist for further management.  Regarding her toe injury, no gaping, no need for repair, toenail was clipped to prevent further injury, will dress wound.  Patient comfortable with discharge home.  Strict return precautions provided.  Patient discharged stable condition.       FINAL CLINICAL IMPRESSION(S) / ED DIAGNOSES   Final diagnoses:  Dental infection  Toe injury, left, initial encounter     Rx / DC Orders   ED Discharge Orders      None        Note:  This document was prepared using Dragon voice recognition software and may include unintentional dictation errors.   Levander Slate, MD 06/30/24 [phone]

## 2024-06-30 NOTE — Discharge Instructions (Addendum)
 You were seen in the emergency department today for evaluation of your dental pain. This is something that you will need to follow-up with a dentist for as soon as possible. We will send you home with antibiotics. Please take these as prescribed, unless otherwise directed by your dentist or another provider. You can take Tylenol  and Ibuprofen  for pain, unless there is another reason that you should not take these. You can also try over-the-counter medication like Orajel per the instructions on the package. Return to the ER for worsening facial swelling, difficulty swallowing, or any other new or concerning symptoms.

## 2024-06-30 NOTE — ED Triage Notes (Signed)
 Pt arrived via POV c/o abscess around tooth on the L side, states been there 2-3 days. Does not have a Education officer, community.

## 2024-09-11 ENCOUNTER — Other Ambulatory Visit: Payer: Self-pay

## 2024-09-11 ENCOUNTER — Emergency Department
Admission: EM | Admit: 2024-09-11 | Discharge: 2024-09-11 | Disposition: A | Discharge status: Home / Self Care | Payer: MEDICAID

## 2024-09-11 DIAGNOSIS — F10930 Alcohol use, unspecified with withdrawal, uncomplicated: Secondary | ICD-10-CM

## 2024-09-11 DIAGNOSIS — F10230 Alcohol dependence with withdrawal, uncomplicated: Secondary | ICD-10-CM | POA: Diagnosis present

## 2024-09-11 DIAGNOSIS — F1090 Alcohol use, unspecified, uncomplicated: Secondary | ICD-10-CM

## 2024-09-11 DIAGNOSIS — Y900 Blood alcohol level of less than 20 mg/100 ml: Secondary | ICD-10-CM | POA: Insufficient documentation

## 2024-09-11 HISTORY — DX: Alcohol abuse, uncomplicated: F10.10

## 2024-09-11 LAB — COMPREHENSIVE METABOLIC PANEL WITH GFR
ALT: 11 U/L (ref 0–44)
AST: 10 U/L — ABNORMAL LOW (ref 15–41)
Albumin: 4.1 g/dL (ref 3.5–5.0)
Alkaline Phosphatase: 63 U/L (ref 38–126)
Anion gap: 12 (ref 5–15)
BUN: 12 mg/dL (ref 6–20)
CO2: 23 mmol/L (ref 22–32)
Calcium: 9 mg/dL (ref 8.9–10.3)
Chloride: 104 mmol/L (ref 98–111)
Creatinine, Ser: 0.72 mg/dL (ref 0.44–1.00)
GFR, Estimated: >60 mL/min (ref >60)
Glucose, Bld: 97 mg/dL (ref 70–99)
Potassium: 3.9 mmol/L (ref 3.5–5.1)
Sodium: 139 mmol/L (ref 135–145)
Total Bilirubin: 0.5 mg/dL (ref 0.0–1.2)
Total Protein: 7.6 g/dL (ref 6.5–8.1)

## 2024-09-11 LAB — CBC
HCT: 42.2 % (ref 36.0–46.0)
Hemoglobin: 14 g/dL (ref 12.0–15.0)
MCH: 30.6 pg (ref 26.0–34.0)
MCHC: 33.2 g/dL (ref 30.0–36.0)
MCV: 92.3 fL (ref 80.0–100.0)
Platelets: 246 K/uL (ref 150–400)
RBC: 4.57 MIL/uL (ref 3.87–5.11)
RDW: 12.2 % (ref 11.5–15.5)
WBC: 7.3 K/uL (ref 4.0–10.5)
nRBC: 0 % (ref 0.0–0.2)

## 2024-09-11 LAB — URINE DRUG SCREEN, QUALITATIVE (ARMC ONLY)
Amphetamines, Ur Screen: NOT DETECTED
Barbiturates, Ur Screen: NOT DETECTED
Benzodiazepine, Ur Scrn: NOT DETECTED
Cannabinoid 50 Ng, Ur ~~LOC~~: NOT DETECTED
Cocaine Metabolite,Ur ~~LOC~~: NOT DETECTED
MDMA (Ecstasy)Ur Screen: NOT DETECTED
Methadone Scn, Ur: NOT DETECTED
Opiate, Ur Screen: NOT DETECTED
Phencyclidine (PCP) Ur S: NOT DETECTED
Tricyclic, Ur Screen: POSITIVE — AB

## 2024-09-11 LAB — URINALYSIS, ROUTINE W REFLEX MICROSCOPIC
Bilirubin Urine: NEGATIVE
Glucose, UA: NEGATIVE mg/dL
Hgb urine dipstick: NEGATIVE
Ketones, ur: 5 mg/dL — AB
Leukocytes,Ua: NEGATIVE
Nitrite: NEGATIVE
Protein, ur: NEGATIVE mg/dL
Specific Gravity, Urine: 1.025 (ref 1.005–1.030)
pH: 7 (ref 5.0–8.0)

## 2024-09-11 LAB — ACETAMINOPHEN LEVEL: Acetaminophen (Tylenol), Serum: <10 ug/mL — ABNORMAL LOW (ref 10–30)

## 2024-09-11 LAB — ETHANOL: Alcohol, Ethyl (B): <15 mg/dL (ref <15)

## 2024-09-11 LAB — SALICYLATE LEVEL: Salicylate Lvl: 17.6 mg/dL (ref 7.0–30.0)

## 2024-09-11 LAB — LIPASE, BLOOD: Lipase: 30 U/L (ref 11–51)

## 2024-09-11 MED ORDER — ONDANSETRON 4 MG PO TBDP
4.0000 mg | ORAL_TABLET | Freq: Once | ORAL | Status: AC
  Administered 2024-09-11: 4 mg via ORAL
  Filled 2024-09-11: qty 1

## 2024-09-11 MED ORDER — ADULT MULTIVITAMIN W/MINERALS CH
1.0000 | ORAL_TABLET | Freq: Every day | ORAL | Status: DC
  Administered 2024-09-11: 1 via ORAL
  Filled 2024-09-11: qty 1

## 2024-09-11 MED ORDER — THIAMINE HCL 100 MG/ML IJ SOLN: 100.0000 mg | Freq: Every day | INTRAMUSCULAR | Status: DC

## 2024-09-11 MED ORDER — LORAZEPAM 1 MG PO TABS
1.0000 mg | ORAL_TABLET | ORAL | Status: DC | PRN
  Administered 2024-09-11: 1 mg via ORAL
  Filled 2024-09-11: qty 1

## 2024-09-11 MED ORDER — LORAZEPAM 2 MG/ML IJ SOLN: 1.0000 mg | INTRAMUSCULAR | Status: DC | PRN

## 2024-09-11 MED ORDER — ONDANSETRON 4 MG PO TBDP
4.0000 mg | ORAL_TABLET | Freq: Three times a day (TID) | ORAL | 0 refills | Status: DC | PRN
  Filled 2024-09-11: qty 20, 7d supply, fill #0

## 2024-09-11 MED ORDER — FOLIC ACID 1 MG PO TABS
1.0000 mg | ORAL_TABLET | Freq: Every day | ORAL | Status: DC
  Administered 2024-09-11: 1 mg via ORAL
  Filled 2024-09-11: qty 1

## 2024-09-11 MED ORDER — ONDANSETRON 4 MG PO TBDP: 4.0000 mg | ORAL_TABLET | Freq: Three times a day (TID) | ORAL | 0 refills | Status: DC | PRN

## 2024-09-11 MED ORDER — GABAPENTIN 600 MG PO TABS: 600.0000 mg | ORAL_TABLET | Freq: Three times a day (TID) | ORAL | 0 refills | Status: DC

## 2024-09-11 MED ORDER — GABAPENTIN 600 MG PO TABS
600.0000 mg | ORAL_TABLET | Freq: Three times a day (TID) | ORAL | 0 refills | Status: DC
  Filled 2024-09-11: qty 42, 14d supply, fill #0

## 2024-09-11 MED ORDER — THIAMINE MONONITRATE 100 MG PO TABS
100.0000 mg | ORAL_TABLET | Freq: Every day | ORAL | Status: DC
Start: 2024-09-11 — End: 2024-09-11
  Administered 2024-09-11: 100 mg via ORAL
  Filled 2024-09-11: qty 1

## 2024-09-11 NOTE — ED Provider Notes (Signed)
 [facility] Provider Note    Event Date/Time   First MD Initiated Contact with Patient 09/11/24 1105     (approximate)   History   alcohol detox  Pt to ED for alcohol detox. Drinks 1 pint vodka and 2 mixed drinks daily. Last drink was yesterday around 4pm. Denies hx ETOH withdrawel seizures. States has diarrhea every morning for 1 hour that goes away once she starts drinking. No pain, rectal bleeding, vomiting or diarrhea today. States has been anxious. No tremors, confusion or diaphoresis noted. Pt is alert and fully oriented. Skin is dry. Red top sent with labs.    HPI Samone Guhl is a 48 y.o. female PMH chronic pain syndrome, IBS, bipolar affective disorder, polysubstance use presents requesting help for detoxification from alcohol. - Patient states her last drink was a pint of vodka yesterday around 4 PM -Presents requesting assistance with detox.  Denied any alcohol withdrawal symptoms on arrival though since then says she has developed a little nausea as well as diarrhea and feels that she might be withdrawing now.  Other substance use. -States she is trying to get a new job and wants to get off alcohol     Physical Exam   Triage Vital Signs: ED Triage Vitals  Encounter Vitals Group     BP 09/11/24 1041 (!) 123/92     Girls Systolic BP Percentile --      Girls Diastolic BP Percentile --      Boys Systolic BP Percentile --      Boys Diastolic BP Percentile --      Pulse Rate 09/11/24 1041 79     Resp 09/11/24 1036 16     Temp 09/11/24 1036 98.3 F (36.8 C)     Temp Source 09/11/24 1036 Oral     SpO2 09/11/24 1041 100 %     Weight 09/11/24 1039 120 lb (54.4 kg)     Height 09/11/24 1039 5' 2 (1.575 m)     Head Circumference --      Peak Flow --      Pain Score 09/11/24 1037 0     Pain Loc --      Pain Education --      Exclude from Growth Chart --     Most recent vital signs: Vitals:   09/11/24 1510 09/11/24 1515  BP: 107/72 107/72   Pulse: 79 79  Resp: 19   Temp: 99 F (37.2 C)   SpO2: 97%      General: Awake, no distress.  Very mild tremor and tongue wag CV:  Good peripheral perfusion.  Mild tachycardia at time of my eval, RP 2+ Resp:  Normal effort. CTAB Abd:  No distention. Nontender to deep palpation throughout   ED Results / Procedures / Treatments   Labs (all labs ordered are listed, but only abnormal results are displayed) Labs Reviewed  COMPREHENSIVE METABOLIC PANEL WITH GFR - Abnormal; Notable for the following components:      Result Value   AST 10 (*)    All other components within normal limits  LIPASE, BLOOD  CBC  ETHANOL  URINALYSIS, ROUTINE W REFLEX MICROSCOPIC  URINE DRUG SCREEN, QUALITATIVE ([facility] ONLY)  SALICYLATE LEVEL  ACETAMINOPHEN  LEVEL  POC URINE PREG, ED     EKG  N/a   RADIOLOGY N/a    PROCEDURES:  Critical Care performed: No  Procedures   MEDICATIONS ORDERED IN ED: Medications  LORazepam  (ATIVAN ) tablet 1-4 mg (1 mg Oral  Given 09/11/24 1336)    Or  LORazepam  (ATIVAN ) injection 1-4 mg ( Intravenous See Alternative 09/11/24 1336)  thiamine (VITAMIN B1) tablet 100 mg (100 mg Oral Given 09/11/24 1336)    Or  thiamine (VITAMIN B1) injection 100 mg ( Intravenous See Alternative 09/11/24 1336)  folic acid (FOLVITE) tablet 1 mg (1 mg Oral Given 09/11/24 1336)  multivitamin with minerals tablet 1 tablet (1 tablet Oral Given 09/11/24 1336)  ondansetron  (ZOFRAN -ODT) disintegrating tablet 4 mg (4 mg Oral Given 09/11/24 1336)     IMPRESSION / MDM / ASSESSMENT AND PLAN / ED COURSE  I reviewed the triage vital signs and the nursing notes.                              DDX/MDM/AP: Differential diagnosis includes, but is not limited to, very mild alcohol withdrawal here--Will treat with oral benzodiazepine.  Is not on any medications for protracted withdrawal, will start high-dose gabapentin assuming she responds well to Ativan  here.  Recommended she follow-up with [facility] as  well as her primary care provider for ongoing alcohol use disorder treatment.  Plan: - Labs - CIWA protocol, oral Ativan  - Zofran  - reassess  Patient's presentation is most consistent with acute presentation with potential threat to life or bodily function.  The patient is on the cardiac monitor to evaluate for evidence of arrhythmia and/or significant heart rate changes.  ED course below.   Clinical Course as of 09/11/24 1556  Tue Sep 11, 2024  1545 Re-evaluated, feeling very well, no ongoing symptoms.  No further tongue wag nor tremor.  Will follow-up with her PMD and [facility].  Plan for high-dose gabapentin for any protracted withdrawal as well as Zofran .  ED return precautions in place.  Patient agrees with plan. [MM]    Clinical Course User Index [MM] Clarine Ozell LABOR, MD     FINAL CLINICAL IMPRESSION(S) / ED DIAGNOSES   Final diagnoses:  Alcohol withdrawal syndrome without complication (HCC)  Alcohol use disorder     Rx / DC Orders   ED Discharge Orders          Ordered    gabapentin (NEURONTIN) 600 MG tablet  3 times daily        09/11/24 1548    ondansetron  (ZOFRAN -ODT) 4 MG disintegrating tablet  Every 8 hours PRN        09/11/24 1548             Note:  This document was prepared using Dragon voice recognition software and may include unintentional dictation errors.   Clarine Ozell LABOR, MD 09/11/24 1556

## 2024-09-11 NOTE — ED Triage Notes (Signed)
 Pt to ED for alcohol detox. Drinks 1 pint vodka and 2 mixed drinks daily. Last drink was yesterday around 4pm. Denies hx ETOH withdrawel seizures. States has diarrhea every morning for 1 hour that goes away once she starts drinking. No pain, rectal bleeding, vomiting or diarrhea today. States has been anxious. No tremors, confusion or diaphoresis noted. Pt is alert and fully oriented. Skin is dry. Red top sent with labs.

## 2024-09-11 NOTE — ED Notes (Addendum)
 Pt stating is cold, warm blankets provided.  Pt also endorses some nausea, no vomiting. States gets upset stomach and can have panic attacks when withdrawing but has not had any seizures. Pt currently laying in bed, NAD at this time.

## 2024-09-11 NOTE — Discharge Instructions (Addendum)
 Your evaluation in the emergency department was notable for evidence of mild alcohol withdrawal which was treated with medications and your symptoms improved.  I have started you on a medication to help with any protracted alcohol withdrawal symptoms (gabapentin) as well as a nausea medication to use as needed.  Please do follow-up with [facility] and your primary care provider for ongoing management of your alcohol use disorder.  Return to the emergency department with any new or worsening symptoms.

## 2024-09-11 NOTE — ED Notes (Signed)
 Pt up to use restroom and provide sample. Pt has steady gait, no difficulty ambulating at this time.

## 2024-09-11 NOTE — ED Notes (Signed)
 Pt asking for crackers, graham crackers provided

## 2024-09-12 ENCOUNTER — Other Ambulatory Visit: Payer: Self-pay

## 2024-09-19 ENCOUNTER — Ambulatory Visit: Payer: Self-pay

## 2024-09-19 NOTE — Telephone Encounter (Signed)
 FYI Only or Action Required?: FYI only for provider: appointment scheduled on 09/21/2024.  Patient was last seen in primary care on 12/30/2023 by Donzella Lauraine SAILOR, DO.  Called Nurse Triage reporting Urinary Frequency.  Symptoms began several months ago.  Interventions attempted: Nothing.  Symptoms are: gradually worsening.  Triage Disposition: See Physician Within 24 Hours  Patient/caregiver understands and will follow disposition?: Yes        Copied from CRM [id number]. Topic: Clinical - Red Word Triage >> Sep 19, 2024 11:35 AM Hadassah PARAS wrote: Red Word that prompted transfer to Nurse Triage: A1c is 7, anytime pt eats something sweet pt needs to urinate, gained weight, tingling in finger tips Reason for Disposition  Urinating more frequently than usual (i.e., frequency) OR new-onset of the feeling of an urgent need to urinate (i.e., urgency)  Answer Assessment - Initial Assessment Questions Pt states her A1c was 7 per her hospital visit.This RN offered pt an earlier appointment in office and pt states she has another appointment on that date and uses public transportation. Pt scheduled for Friday.   1. SYMPTOM: What's the main symptom you're concerned about? (e.g., frequency, incontinence)     Frequency  2. ONSET: When did the  frequency  start?     A few months  3. PAIN: Is there any pain? If Yes, ask: How bad is it? (Scale: 1-10; mild, moderate, severe)     Lower back pain that is normal per pt  4. CAUSE: What do you think is causing the symptoms?     Diabetes  5. OTHER SYMPTOMS: Do you have any other symptoms? (e.g., blood in urine, fever, flank pain, pain with urination)     Tingling and numbness in finger tips, weight gain  Protocols used: Urinary Symptoms-A-AH

## 2024-09-21 ENCOUNTER — Encounter: Payer: Self-pay | Admitting: Family Medicine

## 2024-09-21 ENCOUNTER — Ambulatory Visit (INDEPENDENT_AMBULATORY_CARE_PROVIDER_SITE_OTHER): Payer: MEDICAID | Admitting: Family Medicine

## 2024-09-21 ENCOUNTER — Ambulatory Visit: Payer: Self-pay | Admitting: Family Medicine

## 2024-09-21 ENCOUNTER — Inpatient Hospital Stay: Payer: MEDICAID | Admitting: Family Medicine

## 2024-09-21 VITALS — BP 122/77 | HR 72 | Resp 16 | Wt 132.7 lb

## 2024-09-21 DIAGNOSIS — F332 Major depressive disorder, recurrent severe without psychotic features: Secondary | ICD-10-CM

## 2024-09-21 DIAGNOSIS — N951 Menopausal and female climacteric states: Secondary | ICD-10-CM | POA: Diagnosis not present

## 2024-09-21 DIAGNOSIS — F1010 Alcohol abuse, uncomplicated: Secondary | ICD-10-CM

## 2024-09-21 DIAGNOSIS — R350 Frequency of micturition: Secondary | ICD-10-CM | POA: Diagnosis not present

## 2024-09-21 LAB — POCT URINALYSIS DIPSTICK
Bilirubin, UA: NEGATIVE
Glucose, UA: NEGATIVE
Ketones, UA: NEGATIVE
Leukocytes, UA: NEGATIVE
Nitrite, UA: NEGATIVE
Protein, UA: NEGATIVE
Spec Grav, UA: >=1.03 — AB (ref 1.010–1.025)
Urobilinogen, UA: 0.2 U/dL
pH, UA: 6 (ref 5.0–8.0)

## 2024-09-21 MED ORDER — GABAPENTIN 300 MG PO CAPS: 300.0000 mg | ORAL_CAPSULE | Freq: Three times a day (TID) | ORAL | 3 refills | Status: AC

## 2024-09-21 NOTE — Progress Notes (Signed)
 "  Established Patient Office Visit  Introduced to nurse practitioner role and practice setting.  All questions answered.  Discussed provider/patient relationship and expectations.   Subjective   Patient ID: Autumn [name], female    DOB: [birth year]  Age: 48 y.o. MRN: [id number]  Chief Complaint  Patient presents with   Urinary Frequency   Follow-up    ER follow-up     Discussed the use of AI scribe software for clinical note transcription with the patient, who gave verbal consent to proceed.  History of Present Illness Autumn [name] is a 48 year old female who presents for follow up after ED visit for alcohol intoxication and concerns for urinary frequency.  She has been experiencing increased urinary frequency since starting gabapentin  for alcohol withdrawal. There is no dysuria or significant changes in urinary habits aside from frequency.  She is concerned about her A1c levels after seeing a reading of 7 during a recent hospital visit and her glucose level was 97. She attributes recent weight gain to nighttime eating, which she associates with hunger induced by her Abilify  injections.  She has a history of alcohol use and recently underwent detoxification. She has consumed alcohol twice since Ed visit, including a shot of vodka last night and a beer a few nights prior. She is currently taking gabapentin  300 mg three times a day, which she has been halving to manage her withdrawal symptoms. She also takes Abilify , Prozac, and hydroxyzine  25 mg three times a day- prescribed by her behavioral health provider.  In terms of her menstrual history, she has not had a period in two months and is experiencing perimenopausal symptoms, including spotting.  She is preparing to start a new job. No vision changes or numbness and tingling in her fingers.         09/21/2024    3:42 PM 12/30/2023   10:32 AM 04/01/2022    1:26 PM  Depression screen PHQ 2/9  Decreased Interest 0 1 0  Down,  Depressed, Hopeless 0 2 2  PHQ - 2 Score 0 3 2  Altered sleeping 3 2 3   Tired, decreased energy 0 3 3  Change in appetite 3 2 3   Feeling bad or failure about yourself  0 3 3  Trouble concentrating 0 3 3  Moving slowly or fidgety/restless 0 1 3  Suicidal thoughts 0 0 0  PHQ-9 Score 6 17  20    Difficult doing work/chores Not difficult at all Extremely dIfficult Somewhat difficult     Data saved with a previous flowsheet row definition       09/21/2024    3:43 PM 12/30/2023   10:33 AM  GAD 7 : Generalized Anxiety Score  Nervous, Anxious, on Edge 3 3  Control/stop worrying 3 3  Worry too much - different things 3 3  Trouble relaxing 3 2  Restless 0 1  Easily annoyed or irritable 2 2  Afraid - awful might happen 1 1  Total GAD 7 Score 15 15  Anxiety Difficulty Somewhat difficult Somewhat difficult     ROS  Negative unless indicated in HPI   Objective:     BP 122/77 (BP Location: Left Arm, Patient Position: Sitting, Cuff Size: Normal)   Pulse 72   Resp 16   Wt 132 lb 11.2 oz (60.2 kg)   SpO2 99%   BMI 24.27 kg/m    Physical Exam Constitutional:      General: She is not in acute distress.    Appearance:  Normal appearance. She is not ill-appearing, toxic-appearing or diaphoretic.  HENT:     Head: Normocephalic.     Nose: Nose normal.     Mouth/Throat:     Mouth: Mucous membranes are moist.     Pharynx: Oropharynx is clear.  Eyes:     Extraocular Movements: Extraocular movements intact.     Pupils: Pupils are equal, round, and reactive to light.  Cardiovascular:     Rate and Rhythm: Normal rate and regular rhythm.     Pulses: Normal pulses.     Heart sounds: Normal heart sounds. No murmur heard.    No friction rub. No gallop.  Pulmonary:     Effort: No respiratory distress.     Breath sounds: No stridor. No wheezing, rhonchi or rales.  Chest:     Chest wall: No tenderness.  Musculoskeletal:     Right lower leg: No edema.     Left lower leg: No edema.   Skin:    General: Skin is warm and dry.     Capillary Refill: Capillary refill takes less than 2 seconds.  Neurological:     General: No focal deficit present.     Mental Status: She is alert and oriented to person, place, and time. Mental status is at baseline.  Psychiatric:        Mood and Affect: Mood is anxious.        Behavior: Behavior normal.        Thought Content: Thought content normal.        Judgment: Judgment normal.      Results for orders placed or performed in visit on 09/21/24  Urine Culture   Specimen: Urine   Urine  Result Value Ref Range   Urine Culture, Routine Final report    Organism ID, Bacteria No growth   Urine Microscopic  Result Value Ref Range   WBC, UA None seen 0 - 5 /hpf   RBC, Urine None seen 0 - 2 /hpf   Epithelial Cells (non renal) >10 (A) 0 - 10 /hpf   Casts None seen None seen /lpf   Crystals Present (A) N/A   Crystal Type Calcium Oxalate N/A   Bacteria, UA Many (A) None seen/Few  POCT urinalysis dipstick  Result Value Ref Range   Color, UA Yellow    Clarity, UA Clear    Glucose, UA Negative Negative   Bilirubin, UA Negative    Ketones, UA Negative    Spec Grav, UA >=1.030 (A) 1.010 - 1.025   Blood, UA Moderate    pH, UA 6.0 5.0 - 8.0   Protein, UA Negative Negative   Urobilinogen, UA 0.2 0.2 or 1.0 E.U./dL   Nitrite, UA Negative    Leukocytes, UA Negative Negative   Appearance     Odor        The 10-year ASCVD risk score (Arnett DK, et al., 2019) is: 1.8%    Assessment & Plan:  Frequent urination -     POCT urinalysis dipstick -     Urine Microscopic -     Urine Culture  Alcohol abuse -     Gabapentin ; Take 1 capsule (300 mg total) by mouth 3 (three) times daily.  Dispense: 90 capsule; Refill: 3  Moderately severe recurrent major depression (HCC)  Perimenopausal     Assessment and Plan Assessment & Plan Frequent urination Increased urinary frequency possibly related to gabapentin  use- can be rare  occurrence. Urinalysis showed elevated spec grav and a small  amount of blood, likely due to patient menstrual spotting.  - No dysuria reported. - no odor, discoloration fever, chills, back pain - increase water intake to at least 60 ounces per day - If urine culture is positive, will prescribe antibiotics  Alcohol use disorder Recent alcohol consumption with two alcoholic beverages. Gabapentin  prescribed for alcohol withdrawal by the ED provider, but she is reducing dose due to side effects. Motivated to quit alcohol and historically she states she tried Antabuse , but horrible side effects . Discussed naltrexone as an option to reduce alcohol cravings, but she prefers to manage without it. - Reordered patient's gabapentin  300 mg three times a day - 600mg  dose was too strong for patient - Recommended follow-up with [facility] - pt very motivated to reduce and stop alcohol intake - Advised to schedule follow-up with primary care provider in 4-6 weeks  Perimenopausal state Experiencing perimenopausal symptoms including spotting and irregular periods.  - Last menstrual cycle was two months ago - some spotting today - gabapentin  may help with any vasomotor symptoms she may start to develop  Major Depression - managed by [facility] - continue their care and follow up as planned - continue Abilify  injection - continue prozac daily - prn hydroxyzine   - Continue to monitor symptoms and manage as needed  Return in about 4 weeks (around 10/19/2024) for w/ pcp for chronic disease mgmt.   I, Curtis DELENA Boom, FNP, have reviewed all documentation for this visit. The documentation on 09/24/24 for the exam, diagnosis, procedures, and orders are all accurate and complete.   Curtis DELENA Boom, FNP "

## 2024-09-22 LAB — URINALYSIS, MICROSCOPIC ONLY
Casts: NONE SEEN /LPF
Epithelial Cells (non renal): >10 /HPF — AB (ref 0–10)
RBC, Urine: NONE SEEN /HPF (ref 0–2)
WBC, UA: NONE SEEN /HPF (ref 0–5)

## 2024-09-23 LAB — URINE CULTURE: Organism ID, Bacteria: NO GROWTH

## 2024-09-24 ENCOUNTER — Encounter: Payer: Self-pay | Admitting: Family Medicine

## 2024-11-06 ENCOUNTER — Ambulatory Visit: Payer: MEDICAID | Admitting: Family Medicine
